# Patient Record
Sex: Male | Born: 1960
Health system: Southern US, Community
[De-identification: ages and names within clinical notes are randomized; demographics above are authoritative.]

## PROBLEM LIST (undated history)

## (undated) DIAGNOSIS — D649 Anemia, unspecified: Secondary | ICD-10-CM

## (undated) DIAGNOSIS — K219 Gastro-esophageal reflux disease without esophagitis: Secondary | ICD-10-CM

## (undated) DIAGNOSIS — I639 Cerebral infarction, unspecified: Secondary | ICD-10-CM

## (undated) DIAGNOSIS — I10 Essential (primary) hypertension: Secondary | ICD-10-CM

## (undated) DIAGNOSIS — I2510 Atherosclerotic heart disease of native coronary artery without angina pectoris: Secondary | ICD-10-CM

## (undated) DIAGNOSIS — R05 Cough: Secondary | ICD-10-CM

## (undated) DIAGNOSIS — T8859XA Other complications of anesthesia, initial encounter: Secondary | ICD-10-CM

## (undated) DIAGNOSIS — J309 Allergic rhinitis, unspecified: Secondary | ICD-10-CM

## (undated) DIAGNOSIS — I219 Acute myocardial infarction, unspecified: Secondary | ICD-10-CM

## (undated) DIAGNOSIS — J329 Chronic sinusitis, unspecified: Secondary | ICD-10-CM

## (undated) DIAGNOSIS — D735 Infarction of spleen: Secondary | ICD-10-CM

## (undated) DIAGNOSIS — E119 Type 2 diabetes mellitus without complications: Secondary | ICD-10-CM

## (undated) DIAGNOSIS — R011 Cardiac murmur, unspecified: Secondary | ICD-10-CM

## (undated) DIAGNOSIS — J189 Pneumonia, unspecified organism: Secondary | ICD-10-CM

## (undated) DIAGNOSIS — E7800 Pure hypercholesterolemia, unspecified: Secondary | ICD-10-CM

## (undated) DIAGNOSIS — M869 Osteomyelitis, unspecified: Secondary | ICD-10-CM

## (undated) DIAGNOSIS — R053 Chronic cough: Secondary | ICD-10-CM

## (undated) DIAGNOSIS — T4145XA Adverse effect of unspecified anesthetic, initial encounter: Secondary | ICD-10-CM

## (undated) HISTORY — DX: Infarction of spleen: D73.5

## (undated) HISTORY — PX: TONSILLECTOMY AND ADENOIDECTOMY: SUR1326

## (undated) HISTORY — PX: PENILE PROSTHESIS IMPLANT: SHX240

## (undated) HISTORY — PX: CARDIAC CATHETERIZATION: SHX172

---

## 2009-10-17 DIAGNOSIS — I639 Cerebral infarction, unspecified: Secondary | ICD-10-CM

## 2009-10-17 HISTORY — DX: Cerebral infarction, unspecified: I63.9

## 2011-03-11 ENCOUNTER — Other Ambulatory Visit: Payer: Self-pay | Admitting: Urology

## 2011-03-11 ENCOUNTER — Encounter (HOSPITAL_COMMUNITY): Payer: 59

## 2011-03-11 LAB — BASIC METABOLIC PANEL
Calcium: 10.3 mg/dL (ref 8.4–10.5)
GFR calc Af Amer: >60 mL/min (ref >60)
GFR calc non Af Amer: >60 mL/min (ref >60)
Glucose, Bld: 200 mg/dL — ABNORMAL HIGH (ref 70–99)
Potassium: 3.9 mEq/L (ref 3.5–5.1)
Sodium: 136 mEq/L (ref 135–145)

## 2011-03-11 LAB — CBC
HCT: 41.3 % (ref 39.0–52.0)
MCHC: 33.7 g/dL (ref 30.0–36.0)
Platelets: 257 10*3/uL (ref 150–400)
RDW: 12.8 % (ref 11.5–15.5)
WBC: 8.1 10*3/uL (ref 4.0–10.5)

## 2011-03-11 LAB — SURGICAL PCR SCREEN: Staphylococcus aureus: NEGATIVE

## 2011-03-17 ENCOUNTER — Inpatient Hospital Stay (HOSPITAL_COMMUNITY)
Admission: RE | Admit: 2011-03-17 | Discharge: 2011-03-18 | DRG: 710 | Disposition: A | Discharge status: Home Health | Payer: 59 | Source: Ambulatory Visit | Attending: Urology | Admitting: Urology

## 2011-03-17 DIAGNOSIS — N529 Male erectile dysfunction, unspecified: Principal | ICD-10-CM | POA: Diagnosis present

## 2011-03-17 DIAGNOSIS — Z8673 Personal history of transient ischemic attack (TIA), and cerebral infarction without residual deficits: Secondary | ICD-10-CM

## 2011-03-17 DIAGNOSIS — I10 Essential (primary) hypertension: Secondary | ICD-10-CM | POA: Diagnosis present

## 2011-03-17 DIAGNOSIS — E119 Type 2 diabetes mellitus without complications: Secondary | ICD-10-CM | POA: Diagnosis present

## 2011-03-17 LAB — GENTAMICIN LEVEL, RANDOM: Gentamicin Rm: 3.2 ug/mL

## 2011-03-17 LAB — GLUCOSE, CAPILLARY: Glucose-Capillary: 252 mg/dL — ABNORMAL HIGH (ref 70–99)

## 2011-03-17 NOTE — H&P (Signed)
  NAME:  Albert [name], Albert [name]               ACCOUNT NO.:  [id number]  MEDICAL RECORD NO.:  [id number]           PATIENT TYPE:  LOCATION:                                 FACILITY:  PHYSICIAN:  Ky Barban, M.D.DATE OF BIRTH:  17-May-1961  DATE OF ADMISSION: DATE OF DISCHARGE:  LH                             HISTORY & PHYSICAL   CHIEF COMPLAINT:  Erectile dysfunction.  A 50 year old gentleman I have seen in 2007 with erectile dysfunction. He was treated with medicine with some good results.  I have not seen him since.  He comes back with his wife complains that he has erectile dysfunction.  He cannot get erection in spite he has used all the medicine.  He does not want to use ErecAid.  He wants to know what other he has, so I told him about the penile implant, both inflatable ad rigid penile implants were discussed.  He is inclined, want me to implant inflatable type of implant.  I emphasized that he is diabetic.  The chances of infection in diabetics are higher, so if he develops infection in spite of the reason he will have broad-spectrum antibiotics, then I will have to go ahead and remove the implant.  He will then be at the same starting point with erectile dysfunction.  He understands, wants me to go ahead and do the surgery.  He came to see me on March 12 and I have told to come back after he has seen his family physician, medically cleared, then I will schedule him for implant surgery.  Other problem is that since I seen him last he has developed CVA.  He has weakness on the right side with left CVA, but he is walking around alert and having no residual effect.  His other medical problems include hypertension, type 2 diabetes.  MEDICATIONS:  He takes glyburide and Cialis.  SOCIAL HISTORY:  He does not smoke or drink or take any illicit drugs. He is married.  REVIEW OF SYSTEMS:  Denies any chest pain, orthopnea, PND, nausea, or vomiting.  PHYSICAL EXAMINATION:  GENERAL:   Moderately built, not in acute distress, fully conscious, alert and oriented. VITAL SIGNS:  Blood pressure 128/86, temperature is 98. CENTRAL NERVOUS SYSTEM:  No gross neurological deficit. HEAD, NECK, EYES, ENT:  Negative. CHEST:  Symmetrical. HEART:  Regular sinus rhythm.  No murmur. ABDOMEN:  Soft, flat.  Liver, spleen, and kidneys are not palpable.  No CVA tenderness. EXTERNAL GENITALIA:  Circumcised, meatus adequate.  Testicles are normal. RECTAL:  Normal sphincter tone.  No rectal mass.  Prostate 1.5+ smooth and firm.  IMPRESSION: 1. Erectile dysfunction. 2. Type 2 diabetes mellitus.  It should be noted that his family physician, Dr. Olena Leatherwood has evaluated him, medically cleared him for penile implant.  I have received written note from Dr. Olena Leatherwood from Feb 22, 2011.     Ky Barban, M.D.     MIJ/MEDQ  D:  03/16/2011  T:  03/17/2011  Job:  [id number]  Electronically Signed by Alleen Borne M.D. on 03/17/2011 03:10:01 PM

## 2011-03-18 ENCOUNTER — Observation Stay (HOSPITAL_COMMUNITY): Payer: 59

## 2011-03-18 LAB — BASIC METABOLIC PANEL
CO2: 26 mEq/L (ref 19–32)
Calcium: 9 mg/dL (ref 8.4–10.5)
Creatinine, Ser: 0.66 mg/dL (ref 0.4–1.5)
GFR calc non Af Amer: >60 mL/min (ref >60)
Glucose, Bld: 172 mg/dL — ABNORMAL HIGH (ref 70–99)
Sodium: 135 mEq/L (ref 135–145)

## 2011-03-18 LAB — GLUCOSE, CAPILLARY

## 2011-03-30 NOTE — Op Note (Signed)
NAME:  KENDREW, PACI               ACCOUNT NO.:  [id number]  MEDICAL RECORD NO.:  [id number]           PATIENT TYPE:  LOCATION:                                 FACILITY:  PHYSICIAN:  Ky Barban, M.D.DATE OF BIRTH:  1961/02/13  DATE OF PROCEDURE: DATE OF DISCHARGE:                              OPERATIVE REPORT   PREOPERATIVE DIAGNOSIS:  Erectile dysfunction.  POSTOPERATIVE DIAGNOSIS:  Erectile dysfunction.  PROCEDURE:  Insertion of inflatable penile implant size 15 cm plus 3 cm extender.  ANESTHESIA:  Spinal.  ESTIMATED BLOOD LOSS:  50 mL.  COMPLICATIONS:  None.  PROCEDURE:  The patient under spinal anesthesia in supine position after usual prep and drape, 1-1/2 inch long prepubic incision was made, carried down through the fatty tissue and the front of the pubic symphysis was exposed, at that point with blunt dissection on both sides of the suspensory ligament, the corpora cavernosa were exposed and using 2-0 Vicryl stitches in the corporeal body first on the right then on the left corporotomy was made between these two holding stitches and I made about 2 cm long corporotomy.  The corporeal body was entered on the right side and then I was able to introduce the tip of the sucker all the way proximally and distally once it was done, then I used Hegar dilators gradually dilated this corporeal body to 12 Hegar, #13 Hegar was tried.  It is slightly tight easily goes on the proximal end, distally it is difficult it goes maybe half the way, and I decided not to do any more dilation.  Also want to mention the patient has #18 Foley catheter inserted before the procedure was started.  A second corporotomy was made on the left side and in similar fashion corporeal bodies were dilated to 12 Hegar and 13 goes easily on the right side and the proximal end, it does not go all the way distally, and so I decided to go ahead and select the size and sizer was introduced on the  right side and the tip of the glans penis grabbed with the help of Allis clamp and with slight tension, I checked the size.  The size is 18 cm, so we selected 15 cm plus 3 cm extender.  In the meantime, when they were ready the cylinders, I went ahead and exposed the rectus sheath just above the pubic symphysis on the midline and 2 cm long incision and the rectus sheath was made starting from the pubic symphysis and in between the recti with blunt dissection I was able to enter into the prevesical space and with my finger dissection, I was able to dissect this area and then I used 2 inches Kling hold roll to fill up this area.  I left it in place.  This will be the site where the reservoir goes.  Once I had done that, I had already placed holding stitches in the margins of the rectus sheath using 2-0 Vicryl one on each side.  And I proceeded to insert the cylinders using Furlow inserter with special Mellody Dance needle.  The tip of the needle was introduced  into the inserter and the inserter  is inserted on the right side of the corporotomy and the needle was pushed through the mid glans penis and the thread was held with a hemostat. Needle was removed.  Then the cylinder right cylinder was guided into the corporotomy.  With blunt help with my finger, I introduced the proximal end into the corporotomy and then pushed it down into position with the help of peanut.  All the wrinkles visually were removed similarly, the left cylinder was positioned.  Once it was positioned, I activated properly this cylinders and they fit into the corporotomy nicely.  I do not see any aneurysm.  Penis was obtained.  The distal end of the cylinder appears to be under the glans penis.  The cylinders were deflated left about 30 mL in the cylinders.  Then the reservoir was placed into the retropubic space after removing the Kling from that area with blunt dissection, I pushed the reservoir in place, and then I used 75  mL of fluid total in the cylinders and the reservoir I had to use light 45 mL in the reservoir and there was 30 mL in the cylinders already.  Rubber shod clamp was applied to the tubing coming out of the cylinder.  Now with blunt dissection, I dissected the inside of the scrotum on the right side.  I was able to evert it and get all the tissue and very thin skin making sure without making any nick into the skin.  The pump was placed in the right hemiscrotum and held in place with a Babcock.  The pump appears to be in proper position.  Tubing already has a rubber shod clamp, so I was able to cut the extra tubing from the pump and the reservoir and then able to connect it properly with the special connectors.  There is no redundancy in the tubing, it was properly in the wound..  Once the connection was made then I was able to activate the pump and a nice erection is obtained. Periodically, I have irrigated the wound with antiseptic solution.  I proceeded to close the rectus sheath with interrupted sutures very carefully not to nick any tubing.  I used 0 Vicryl stitch.  Once that was closed, then I closed the right corporotomy under direct vision without making sure not to nick any tubing or the cylinders.  Both corporotomy were closed properly, I did not remove the covering of the tubing which goes to the cylinders.  Again the implant was activated, nice erection was obtained.  I left slightly inflated penile implant and then I proceeded to closed the incision using 3-0 Vicryl.  The fatty tissue was placed between the tubing to separate all of them carefully. Once the subcutaneous tissue was approximated and the skin was closed with subcuticular stitch.  Sterile gauze dressing applied.  At the end, I removed the stitches which were at the tip of the cylinders, and there was some bleeding going on from the glans penis with pressure I was able to stop it.  Sterile gauze dressing applied to the  prepubic area.  Foley catheter was left in place.  The patient left the operating room in satisfactory condition.     Ky Barban, M.D.     MIJ/MEDQ  D:  03/17/2011  T:  03/18/2011  Job:  [id number]  Electronically Signed by Alleen Borne M.D. on 03/30/2011 01:24:26 PM

## 2011-05-03 NOTE — Discharge Summary (Signed)
NAME:  Albert [name], Albert [name] NO.:  [id number]  MEDICAL RECORD NO.:  [id number]  LOCATION:  A324                          FACILITY:  [facility]  PHYSICIAN:  Ky Barban, M.D.DATE OF BIRTH:  Oct 11, 1961  DATE OF ADMISSION:  03/17/2011 DATE OF DISCHARGE:  06/01/2012LH                              DISCHARGE SUMMARY   A 50 years old gentleman who is known to have erectile dysfunction.  He wanted to have inflatable penile implant after going over the possible complications and limitation of the surgery.  He was brought as outpatient having routine admission workup, CBC, urinalysis, and SMA-7 is normal.  The patient is known diabetic and he is admitted in the hospital after having surgery.  AMS inflatable penile implant was inserted.  I used 15 cm plus 3 cm extender.  Postop course was benign. Next day, his wound was healing up nicely.  He was given prophylactic antibiotic of vancomycin and he needs to take it for another 7 days.  I have made arrangement for him to come as outpatient, have his vancomycin injection, and I will see him back in the office in 1 week.  FINAL DISCHARGE DIAGNOSIS:  Erectile dysfunction.  DISCHARGE CONDITION:  Improved.  DISCHARGE MEDICATIONS:  He is advised to continue his medicines for his diabetes and he will come to have his vancomycin as outpatient. I also gave him Percocet 30 tablets one q.6 hours p.r.n.     Ky Barban, M.D.     MIJ/MEDQ  D:  05/02/2011  T:  05/03/2011  Job:  [id number]

## 2012-10-17 DIAGNOSIS — I219 Acute myocardial infarction, unspecified: Secondary | ICD-10-CM

## 2012-10-17 HISTORY — PX: OTHER SURGICAL HISTORY: SHX169

## 2012-10-17 HISTORY — DX: Acute myocardial infarction, unspecified: I21.9

## 2012-12-07 ENCOUNTER — Inpatient Hospital Stay (HOSPITAL_COMMUNITY)
Admission: EM | Admit: 2012-12-07 | Discharge: 2012-12-18 | DRG: 617 | Disposition: A | Discharge status: Home / Self Care | Payer: 59 | Attending: Family Medicine | Admitting: Family Medicine

## 2012-12-07 ENCOUNTER — Emergency Department (HOSPITAL_COMMUNITY): Payer: 59

## 2012-12-07 ENCOUNTER — Encounter (HOSPITAL_COMMUNITY): Payer: Self-pay | Admitting: Emergency Medicine

## 2012-12-07 DIAGNOSIS — I69998 Other sequelae following unspecified cerebrovascular disease: Secondary | ICD-10-CM

## 2012-12-07 DIAGNOSIS — I96 Gangrene, not elsewhere classified: Secondary | ICD-10-CM | POA: Diagnosis present

## 2012-12-07 DIAGNOSIS — R29898 Other symptoms and signs involving the musculoskeletal system: Secondary | ICD-10-CM | POA: Diagnosis present

## 2012-12-07 DIAGNOSIS — E1169 Type 2 diabetes mellitus with other specified complication: Principal | ICD-10-CM | POA: Diagnosis present

## 2012-12-07 DIAGNOSIS — N179 Acute kidney failure, unspecified: Secondary | ICD-10-CM | POA: Diagnosis present

## 2012-12-07 DIAGNOSIS — R110 Nausea: Secondary | ICD-10-CM | POA: Diagnosis present

## 2012-12-07 DIAGNOSIS — Z79899 Other long term (current) drug therapy: Secondary | ICD-10-CM

## 2012-12-07 DIAGNOSIS — E1369 Other specified diabetes mellitus with other specified complication: Secondary | ICD-10-CM | POA: Diagnosis present

## 2012-12-07 DIAGNOSIS — Z794 Long term (current) use of insulin: Secondary | ICD-10-CM

## 2012-12-07 DIAGNOSIS — E1142 Type 2 diabetes mellitus with diabetic polyneuropathy: Secondary | ICD-10-CM | POA: Diagnosis present

## 2012-12-07 DIAGNOSIS — Z7982 Long term (current) use of aspirin: Secondary | ICD-10-CM

## 2012-12-07 DIAGNOSIS — I70269 Atherosclerosis of native arteries of extremities with gangrene, unspecified extremity: Secondary | ICD-10-CM | POA: Diagnosis present

## 2012-12-07 DIAGNOSIS — E1149 Type 2 diabetes mellitus with other diabetic neurological complication: Secondary | ICD-10-CM | POA: Diagnosis present

## 2012-12-07 DIAGNOSIS — I10 Essential (primary) hypertension: Secondary | ICD-10-CM | POA: Diagnosis present

## 2012-12-07 DIAGNOSIS — IMO0002 Reserved for concepts with insufficient information to code with codable children: Secondary | ICD-10-CM | POA: Diagnosis present

## 2012-12-07 DIAGNOSIS — E7800 Pure hypercholesterolemia, unspecified: Secondary | ICD-10-CM | POA: Diagnosis present

## 2012-12-07 DIAGNOSIS — Z01818 Encounter for other preprocedural examination: Secondary | ICD-10-CM

## 2012-12-07 DIAGNOSIS — J329 Chronic sinusitis, unspecified: Secondary | ICD-10-CM | POA: Diagnosis present

## 2012-12-07 DIAGNOSIS — I70209 Unspecified atherosclerosis of native arteries of extremities, unspecified extremity: Secondary | ICD-10-CM | POA: Diagnosis present

## 2012-12-07 HISTORY — DX: Cough: R05

## 2012-12-07 HISTORY — DX: Cerebral infarction, unspecified: I63.9

## 2012-12-07 HISTORY — DX: Chronic sinusitis, unspecified: J32.9

## 2012-12-07 HISTORY — DX: Allergic rhinitis, unspecified: J30.9

## 2012-12-07 HISTORY — DX: Type 2 diabetes mellitus without complications: E11.9

## 2012-12-07 HISTORY — DX: Essential (primary) hypertension: I10

## 2012-12-07 HISTORY — DX: Chronic cough: R05.3

## 2012-12-07 HISTORY — DX: Pure hypercholesterolemia, unspecified: E78.00

## 2012-12-07 LAB — BASIC METABOLIC PANEL
BUN: 24 mg/dL — ABNORMAL HIGH (ref 6–23)
CO2: 21 mEq/L (ref 19–32)
Chloride: 94 mEq/L — ABNORMAL LOW (ref 96–112)
Creatinine, Ser: 1.67 mg/dL — ABNORMAL HIGH (ref 0.50–1.35)
GFR calc Af Amer: 54 mL/min — ABNORMAL LOW (ref >90)
Glucose, Bld: 229 mg/dL — ABNORMAL HIGH (ref 70–99)
Potassium: 4.1 mEq/L (ref 3.5–5.1)

## 2012-12-07 LAB — CBC
HCT: 36.4 % — ABNORMAL LOW (ref 39.0–52.0)
Hemoglobin: 12.8 g/dL — ABNORMAL LOW (ref 13.0–17.0)
MCV: 78.6 fL (ref 78.0–100.0)
RBC: 4.63 MIL/uL (ref 4.22–5.81)
RDW: 13.1 % (ref 11.5–15.5)
WBC: 14.3 10*3/uL — ABNORMAL HIGH (ref 4.0–10.5)

## 2012-12-07 MED ORDER — ASPIRIN EC 81 MG PO TBEC
81.0000 mg | DELAYED_RELEASE_TABLET | Freq: Every day | ORAL | Status: DC
  Administered 2012-12-08 – 2012-12-18 (×10): 81 mg via ORAL
  Filled 2012-12-07 (×11): qty 1

## 2012-12-07 MED ORDER — SODIUM CHLORIDE 0.9 % IV SOLN: INTRAVENOUS | Status: DC

## 2012-12-07 MED ORDER — INSULIN GLARGINE 100 UNIT/ML ~~LOC~~ SOLN
10.0000 [IU] | Freq: Every day | SUBCUTANEOUS | Status: DC
  Administered 2012-12-08 (×2): 10 [IU] via SUBCUTANEOUS

## 2012-12-07 MED ORDER — INSULIN ASPART 100 UNIT/ML ~~LOC~~ SOLN
0.0000 [IU] | SUBCUTANEOUS | Status: DC
  Administered 2012-12-08: 15 [IU] via SUBCUTANEOUS
  Administered 2012-12-08: 11 [IU] via SUBCUTANEOUS
  Administered 2012-12-08 (×2): 7 [IU] via SUBCUTANEOUS
  Administered 2012-12-08: 3 [IU] via SUBCUTANEOUS
  Administered 2012-12-09: 12 [IU] via SUBCUTANEOUS
  Administered 2012-12-09: 15 [IU] via SUBCUTANEOUS
  Administered 2012-12-09: 11 [IU] via SUBCUTANEOUS
  Administered 2012-12-09 – 2012-12-10 (×2): 7 [IU] via SUBCUTANEOUS
  Administered 2012-12-10: 11 [IU] via SUBCUTANEOUS
  Administered 2012-12-10 (×2): 7 [IU] via SUBCUTANEOUS
  Administered 2012-12-10 – 2012-12-11 (×3): 4 [IU] via SUBCUTANEOUS
  Administered 2012-12-11: 7 [IU] via SUBCUTANEOUS

## 2012-12-07 MED ORDER — PIPERACILLIN-TAZOBACTAM 3.375 G IVPB 30 MIN
3.3750 g | Freq: Once | INTRAVENOUS | Status: AC
  Administered 2012-12-07: 3.375 g via INTRAVENOUS
  Filled 2012-12-07: qty 50

## 2012-12-07 MED ORDER — VANCOMYCIN HCL IN DEXTROSE 1-5 GM/200ML-% IV SOLN: 1000.0000 mg | Freq: Once | INTRAVENOUS | Status: DC

## 2012-12-07 MED ORDER — SIMVASTATIN 40 MG PO TABS
40.0000 mg | ORAL_TABLET | Freq: Every evening | ORAL | Status: DC
  Administered 2012-12-08 – 2012-12-17 (×10): 40 mg via ORAL
  Filled 2012-12-07 (×11): qty 1

## 2012-12-07 MED ORDER — ONDANSETRON HCL 4 MG/2ML IJ SOLN
4.0000 mg | Freq: Four times a day (QID) | INTRAMUSCULAR | Status: DC | PRN
  Administered 2012-12-08 (×2): 4 mg via INTRAVENOUS
  Filled 2012-12-07 (×2): qty 2

## 2012-12-07 MED ORDER — ONDANSETRON HCL 4 MG/2ML IJ SOLN
4.0000 mg | Freq: Once | INTRAMUSCULAR | Status: AC
  Administered 2012-12-07: 4 mg via INTRAVENOUS
  Filled 2012-12-07: qty 2

## 2012-12-07 MED ORDER — METOPROLOL TARTRATE 25 MG PO TABS
25.0000 mg | ORAL_TABLET | Freq: Two times a day (BID) | ORAL | Status: DC
  Administered 2012-12-08: 25 mg via ORAL
  Filled 2012-12-07 (×3): qty 1

## 2012-12-07 MED ORDER — IRBESARTAN 300 MG PO TABS
300.0000 mg | ORAL_TABLET | Freq: Every day | ORAL | Status: DC
  Filled 2012-12-07: qty 1

## 2012-12-07 MED ORDER — HEPARIN SODIUM (PORCINE) 5000 UNIT/ML IJ SOLN
5000.0000 [IU] | Freq: Three times a day (TID) | INTRAMUSCULAR | Status: DC
  Administered 2012-12-08 – 2012-12-10 (×6): 5000 [IU] via SUBCUTANEOUS
  Filled 2012-12-07 (×9): qty 1

## 2012-12-07 MED ORDER — HYDROMORPHONE HCL PF 1 MG/ML IJ SOLN
0.5000 mg | Freq: Once | INTRAMUSCULAR | Status: AC
  Administered 2012-12-07: 0.5 mg via INTRAVENOUS
  Filled 2012-12-07: qty 1

## 2012-12-07 MED ORDER — HYDROMORPHONE HCL PF 1 MG/ML IJ SOLN
0.5000 mg | INTRAMUSCULAR | Status: DC | PRN
  Administered 2012-12-09 – 2012-12-14 (×11): 0.5 mg via INTRAVENOUS
  Filled 2012-12-07 (×9): qty 1

## 2012-12-07 MED ORDER — IRBESARTAN-HYDROCHLOROTHIAZIDE 300-12.5 MG PO TABS: 1.0000 | ORAL_TABLET | Freq: Every day | ORAL | Status: DC

## 2012-12-07 MED ORDER — HYDROCHLOROTHIAZIDE 12.5 MG PO CAPS
12.5000 mg | ORAL_CAPSULE | Freq: Every day | ORAL | Status: DC
  Filled 2012-12-07: qty 1

## 2012-12-07 MED ORDER — SODIUM CHLORIDE 0.9 % IV SOLN
INTRAVENOUS | Status: DC
  Administered 2012-12-08 – 2012-12-11 (×4): via INTRAVENOUS
  Administered 2012-12-15: 16 mL/h via INTRAVENOUS
  Administered 2012-12-16: 03:00:00 via INTRAVENOUS

## 2012-12-07 NOTE — H&P (Signed)
Family Medicine Teaching Service Admission H&P Service Pager: [phone]  Patient name: Albert [name] Medical record number: [id number] Date of birth: Aug 10, 1961 Age: 52 y.o. Gender: male  Primary Care Provider: [name], MD Attending Physician: Tobey Grim, MD Consultants:   Orthopedics: Lysle Rubens Magnus Ivan & Lajoyce Corners)  Vascular Surgery: Consulted directly by Dr. Magnus Ivan (ortho)  CODE STATUS: Full Code  CC  Gangrene  HPI  Albert [name] is a 52 y.o. year old male presenting with acute change in his foot following a left 4th toe amputation ~1 month ago by his surgeon in [place] (Dr. Gabriel Cirri).  He states he followed up 2 days ago and had the wound debrided due to poor healing and was told to perform qod dressing changes.  He reports when removing the dressing today his Left 3rd toe was noted to be black, there was increased drainage from the wound, and he had rubor of the dorsum of his foot.  He is followed in [name] by his primary care doctor for poorly controlled DM2 (last A1c ~9), HTN, HLD.  He reports he has been working very hard recently on controlling his chronic conditions however had been previously very poorly controlled.  He had been referred to vascular surgery and was due to meet with Dr. Imogene Burn within the next 2 weeks.  ROS   Constitutional No fatigue, no malaise, nasal congestion  Infectious subjective fevers and chills, no measurable fevers  Resp Cough associated with nasal congestion >1 month - chronic, non-productive, no dyspnea,    CV No orthopnea, no DOE, no claudication, no palpitations  GI + Nausea but no vomiting, reports "diabetes has given him stomach problems" but unaware of "gastroparesis"  GU No dysuria, no frequency  Skin  rubor on LE,   Neuro Hx of CVA with R UE residual deficit    HISTORY  PMHx:  Past Medical History  Diagnosis Date  . Hypertension   . Diabetes mellitus without complication ~2000    last HbA1c ~9  . Hypercholesteremia   .  CVA (cerebral infarction) 2011    R hand deficit  . Chronic sinusitis   . Allergic rhinitis   . Chronic cough     PSHx: Past Surgical History  Procedure Laterality Date  . 4th toe amputation    . Tonsillectomy and adenoidectomy    . Penile prosthesis implant      Social Hx: History   Social History  . Marital Status: Married    Spouse Name: [name]    Number of Children: [name]  . Years of Education: [name]   Social History Main Topics  . Smoking status: Never Smoker   . Smokeless tobacco: None  . Alcohol Use: No  . Drug Use: No  . Sexually Active: None   Other Topics Concern  . None   Social History Narrative   Lives in [place]   Wife: [name]    Family Hx: Family History  Problem Relation Age of Onset  . Diabetes    . Pancreatic cancer Mother   . Breast cancer Sister     Allergies: Allergies  Allergen Reactions  . Amoxicillin     vomiting    Home Medications: Prescriptions prior to admission  Medication Sig Dispense Refill  . aspirin 81 MG tablet Take 81 mg by mouth daily.      Marland Kitchen glyBURIDE-metformin (GLUCOVANCE) 2.5-500 MG per tablet Take 1 tablet by mouth daily with breakfast.      . HYDROcodone-acetaminophen (NORCO/VICODIN) 5-325 MG per  tablet Take 1 tablet by mouth every 6 (six) hours as needed for pain.      Marland Kitchen insulin aspart (NOVOLOG FLEXPEN) 100 UNIT/ML injection Inject 12 Units into the skin 3 (three) times daily. Take every time he eats per patient      . insulin detemir (LEVEMIR) 100 UNIT/ML injection Inject 20 Units into the skin every evening.      . irbesartan-hydrochlorothiazide (AVALIDE) 300-12.5 MG per tablet Take 1 tablet by mouth daily.      . metoprolol tartrate (LOPRESSOR) 25 MG tablet Take 25 mg by mouth 2 (two) times daily.      . Nutritional Supplements (EQUATE PO) Take 1 tablet by mouth daily.      . ondansetron (ZOFRAN) 8 MG tablet Take by mouth every 8 (eight) hours as needed for nausea. For upset stomach      . simvastatin (ZOCOR) 40 MG  tablet Take 40 mg by mouth every evening.      . traMADol (ULTRAM) 50 MG tablet Take 50 mg by mouth every 6 (six) hours as needed for pain. For pain        OBJECTIVE  Vitals: Temp:  [97.6 F (36.4 C)-98.3 F (36.8 C)] 98.3 F (36.8 C) (02/21 2315) Pulse Rate:  [97-106] 106 (02/21 2315) Resp:  [18-20] 20 (02/21 2315) BP: (100-154)/(64-95) 154/95 mmHg (02/21 2315) SpO2:  [98 %-100 %] 100 % (02/21 2315) Weight:  [245 lb 11.2 oz (111.449 kg)] 245 lb 11.2 oz (111.449 kg) (02/21 2315)  Weight: Wt Readings from Last 3 Encounters:  12/07/12 245 lb 11.2 oz (111.449 kg)  12/07/12 245 lb 11.2 oz (111.449 kg)    I&Os: Yesterday:   This shift:     PE: GENERAL:  Adult obese caucasian  male. In no discomfort; no respiratory distress. PSYCH: Alert and appropriately interactive; Insight:Good   H&N: AT/[place], trachea midline; TM clear, no fluid; high arched palate, some nasal congestion EENT:  MMM, no scleral icterus, EOMi HEART: RRR, S1/S2 heard, no murmur LUNGS: CTA B, no wheezes, no crackles EXTREMITIES: Moves all 4 extremities spontaneously, R hand with claw deformation and associated hemiparesis, warm well perfused, no edema, non-palpable DP & TP on Left, trace DP on R.  EDP reports dopplerable pulses in BLE    LABS: CBC BMET   Recent Labs Lab 12/07/12 2041  WBC 14.3*  HGB 12.8*  HCT 36.4*  PLT 265    Recent Labs Lab 12/07/12 2041  NA 131*  K 4.1  CL 94*  CO2 21  BUN 24*  CREATININE 1.67*  GLUCOSE 229*  CALCIUM 8.9     URINE STUDIES: None  MICRO: None - Emperic ABX started >4 hour prior to eval  IMAGING: Dg Foot Complete Left  12/07/2012  *RADIOLOGY REPORT*  Clinical Data: Toe injury  LEFT FOOT - COMPLETE 3+ VIEW  Comparison: 11/08/2012  Findings: Three views of the left foot submitted.  Stable chronic deformity of the proximal phalanx fifth toe. There is soft tissue swelling fifth toe.  The patient is status post amputation of the fourth toe.  There is  probable prior partial resection of distal aspect of the fourth metatarsal.  There is cortical irregularity distal aspect of the fourth metatarsal.  Osteomyelitis cannot be excluded. Clinical correlation is necessary.  No acute fracture or subluxation.  IMPRESSION: Stable chronic deformity of the proximal phalanx fifth toe. There is soft tissue swelling fifth toe.  The patient is status post amputation of the fourth toe.  There is probable  prior partial resection of distal aspect of the fourth metatarsal.  There is cortical irregularity distal aspect of the fourth metatarsal. Osteomyelitis cannot be excluded. Clinical correlation is necessary.  No acute fracture or subluxation.   Original Report Authenticated By: Natasha Mead, M.D.    EKG: ORDERED >>>  Medications:   . sodium chloride     . aspirin EC  81 mg Oral Daily  . heparin  5,000 Units Subcutaneous Q8H  . irbesartan  300 mg Oral Daily   And  . hydrochlorothiazide  12.5 mg Oral Daily  . insulin aspart  0-20 Units Subcutaneous Q4H  . insulin glargine  10 Units Subcutaneous QHS  . metoprolol tartrate  25 mg Oral BID  . simvastatin  40 mg Oral QPM  . vancomycin  1,000 mg Intravenous Once   HYDROmorphone (DILAUDID) injection, ondansetron (ZOFRAN) IV, sodium chloride  Assessment & Plan  LOS: [age] y.o. year old male with gangrene of L 3rd toe s/p prior 4th toe excision with poor healing and local tissue necrosis & rubor.  Subjective fevers.  Poor blood flow likely due to combination of microvascular and large vessel disease disease 2o/2 poorly controlled DM and likely atherosclerosis given hx of prior CVA.  # Gangrene & Leukocytosis & Diabetic Peripheral Neuropathy: Dr. Magnus Ivan (ortho) has evaluated and will plan for surgery on 2/22.  He will discuss with Vascular Surgery.  No blood cultures obtained, has been >4 hours after initiation of ABX  Vanco & Zosyn per pharmacy  Dilaudid for pain  Consider Neurontin if pain worsens  # Cardiac &  HTN: Will continue home meds.  EKG for pre-operative evaluation. On BB at home continue for operative risk reduction.  Monitor Renal function given ARB. High functional status, no resp/cardiac limitations activity level able to walk ~34mile without difficulty prior to foot   Irbesartan, HCTZ, Lopressor  # Peripheral Vascular Disease: likely combination of microvascular and large vessel disease.  Vascular to evaluate.  Consider intervention  ABI pre-op although will likely be inaccurate given diabetic  # Nasal Congestion: pt with high arched palate, chronic sinus disease.  Would likely benefit from trial of Nasal Corticosteroids as OP.  Will provide Ocean Nasal Spray.  Given duration of sx could consider infection but Vanco/Zosyn will adequately cover any potential infection  Ocean Nasal Spray  # Kidney Disease (likely chronic): no baseline level.  Will continue to monitor if worsening will stop ARB and other nephrotoxic agents.      # DM & HLD: Will obtain risk stratification labs.  (Lipid, A1c)  Statin,   Lantus 10 + SSI Resistant q4o (NPO for surgery)  # Nausea: Chronic as Zofran on home med list.  Consider diabetic gastroparesis. Will trial PPI  Zofran  Protonix  --- FEN  *KVO -NPO after midnight except meds --- PPx: Heparin     Disposition  To Med/Surg bed.  IV ABX.  To OR in AM with Ortho +/- Vascular for amputation with potential vascular intervention.  Consider continuing ABX (okay to transition to po) following surgery for 2-3 days given no blood cultures obtained.  Will need to monitor fever curve and observe leukocytosis.   Andrena Mews, DO [facility] Resident - PGY-2  12/08/2012 12:27 AM

## 2012-12-07 NOTE — ED Notes (Signed)
IV team paged for pt venous access

## 2012-12-07 NOTE — ED Provider Notes (Signed)
Patient  presents with discoloration of left distal foot. On exam the floor to middle toe is gangrenous and black web space between third and fifth toe is also gangrenous and black fourth toe has been previously amputated  Doug Sou, MD 12/07/12 2247

## 2012-12-07 NOTE — ED Provider Notes (Signed)
History     CSN: [id number]  Arrival date & time 12/07/12  1811   First MD Initiated Contact with Patient 12/07/12 2031      Chief Complaint  Patient presents with  . Toe Injury    (Consider location/radiation/quality/duration/timing/severity/associated sxs/prior treatment) HPI Comments: Albert [name] is a 52 y.o. male with a history of hypertension, diabetes and hyperlipidemia presents emergency department with chief complaint of necrotic left third toe. Onset of symptoms began yesterday and are associated with tactile fever, chills, and pain. 1 month ago pt reports similar presentation of his 4th toe which was diagnosed w gangrene & surgically removed in [facility] by Dr.DeMason.  Patient has been following up with his surgeon over the last month and was evaluated by him on Wednesday at which time he was recommended to followup with vascular surgery and for decreased peripheral flow.  Patient states he's got an appointment with Dr. Claudie Fisherman scheduled on March 5, however his toe became more painful and black yesterday causing him to come to the emergency department for further evaluation.  The history is provided by the patient and medical records.    Past Medical History  Diagnosis Date  . Hypertension   . Diabetes mellitus without complication   . Hypercholesteremia     History reviewed. No pertinent past surgical history.  History reviewed. No pertinent family history.  History  Substance Use Topics  . Smoking status: Never Smoker   . Smokeless tobacco: Not on file  . Alcohol Use: No      Review of Systems  Constitutional: Positive for fever and chills. Negative for appetite change.  HENT: Negative for congestion.   Eyes: Negative for visual disturbance.  Respiratory: Negative for shortness of breath.   Cardiovascular: Negative for chest pain and leg swelling.  Gastrointestinal: Negative for abdominal pain.  Genitourinary: Negative for dysuria, urgency and frequency.   Skin: Positive for color change, rash and wound.  Neurological: Negative for dizziness, syncope, weakness, light-headedness, numbness and headaches.  Psychiatric/Behavioral: Negative for confusion.  All other systems reviewed and are negative.    Allergies  Amoxicillin  Home Medications   Current Outpatient Rx  Name  Route  Sig  Dispense  Refill  . aspirin 81 MG tablet   Oral   Take 81 mg by mouth daily.         Marland Kitchen glyBURIDE-metformin (GLUCOVANCE) 2.5-500 MG per tablet   Oral   Take 1 tablet by mouth daily with breakfast.         . HYDROcodone-acetaminophen (NORCO/VICODIN) 5-325 MG per tablet   Oral   Take 1 tablet by mouth every 6 (six) hours as needed for pain.         Marland Kitchen insulin aspart (NOVOLOG FLEXPEN) 100 UNIT/ML injection   Subcutaneous   Inject 12 Units into the skin 3 (three) times daily. Take every time he eats per patient         . insulin detemir (LEVEMIR) 100 UNIT/ML injection   Subcutaneous   Inject 20 Units into the skin every evening.         . irbesartan-hydrochlorothiazide (AVALIDE) 300-12.5 MG per tablet   Oral   Take 1 tablet by mouth daily.         . metoprolol tartrate (LOPRESSOR) 25 MG tablet   Oral   Take 25 mg by mouth 2 (two) times daily.         . Nutritional Supplements (EQUATE PO)   Oral   Take 1 tablet  by mouth daily.         . ondansetron (ZOFRAN) 8 MG tablet   Oral   Take by mouth every 8 (eight) hours as needed for nausea. For upset stomach         . simvastatin (ZOCOR) 40 MG tablet   Oral   Take 40 mg by mouth every evening.         . traMADol (ULTRAM) 50 MG tablet   Oral   Take 50 mg by mouth every 6 (six) hours as needed for pain. For pain           BP 100/64  Pulse 99  Temp(Src) 97.6 F (36.4 C) (Oral)  Resp 18  SpO2 100%  Physical Exam  Nursing note and vitals reviewed. Constitutional: He is oriented to person, place, and time. He appears well-developed and well-nourished. No distress.   HENT:  Head: Normocephalic and atraumatic.  Eyes: Conjunctivae and EOM are normal.  Neck: Normal range of motion.  Cardiovascular:  Regular rate rhythm.  Decreased pedal pulses bilaterally.  2+ pitting edema of left lower extremity  Pulmonary/Chest: Effort normal.  Lungs clear to auscultation bilaterally  Musculoskeletal: Normal range of motion.  Neurological: He is alert and oriented to person, place, and time.  Skin: Skin is warm and dry. No rash noted. He is not diaphoretic.  Erythema of left lower extremity extending from the digits to ankle anteriorly.  Third toe necrotic.  Open wound at amputation site of fourth metacarpal with purulent drainage.  Tenderness to palpation throughout  Psychiatric: He has a normal mood and affect. His behavior is normal.    ED Course  Procedures (including critical care time)  Labs Reviewed  CBC - Abnormal; Notable for the following:    WBC 14.3 (*)    Hemoglobin 12.8 (*)    HCT 36.4 (*)    All other components within normal limits  BASIC METABOLIC PANEL - Abnormal; Notable for the following:    Sodium 131 (*)    Chloride 94 (*)    Glucose, Bld 229 (*)    BUN 24 (*)    Creatinine, Ser 1.67 (*)    GFR calc non Af Amer 46 (*)    GFR calc Af Amer 54 (*)    All other components within normal limits   Dg Foot Complete Left  12/07/2012  *RADIOLOGY REPORT*  Clinical Data: Toe injury  LEFT FOOT - COMPLETE 3+ VIEW  Comparison: 11/08/2012  Findings: Three views of the left foot submitted.  Stable chronic deformity of the proximal phalanx fifth toe. There is soft tissue swelling fifth toe.  The patient is status post amputation of the fourth toe.  There is probable prior partial resection of distal aspect of the fourth metatarsal.  There is cortical irregularity distal aspect of the fourth metatarsal.  Osteomyelitis cannot be excluded. Clinical correlation is necessary.  No acute fracture or subluxation.  IMPRESSION: Stable chronic deformity of the  proximal phalanx fifth toe. There is soft tissue swelling fifth toe.  The patient is status post amputation of the fourth toe.  There is probable prior partial resection of distal aspect of the fourth metatarsal.  There is cortical irregularity distal aspect of the fourth metatarsal. Osteomyelitis cannot be excluded. Clinical correlation is necessary.  No acute fracture or subluxation.   Original Report Authenticated By: Natasha Mead, M.D.    Consult Ortho Surgery, Dr. Magnus Ivan: will see pt as consult after admission.   No diagnosis found.  MDM  Gangrene  52 year old diabetic presents emergency department for left third necrotic toe, onset yesterday and wound check of left fourth toe amputation site from previous diagnosis of gangrene.  Labs and imaging reviewed indicating questionable osteomyelitis, leukocytosis, hyperglycemia and acute renal failure.  IV fluids, vancomycin and Zosyn, and pain medications given one emergency department.  Patient is with normal vital signs appears hemodynamically stable in no acute distress.  Orthopedics surgery has been consulted as above.  Patient to be admitted to family service as his primary care provider is located out of town in [place], [place] internal medicine        Jaci Carrel, [place] 12/10/12 2147

## 2012-12-07 NOTE — ED Notes (Signed)
Pt c/o discoloration and possible infection; pt sts hx of similar with amputations

## 2012-12-07 NOTE — Consult Note (Signed)
Reason for Consult:  Left foot necrotic 3rd toe and open wound with poor healing Referring Physician:   ER EM  Albert [name] is an 52 y.o. male.  HPI:   52 yo male with poorly controlled diabetes and a recent 4th toe amputation about one month ago in [place].  He was being referred to vascular surgery as well as the wound center, but came to the ER tonight with a necrotic 3rd toe and increased drainage form the open wound on his foot.  Past Medical History  Diagnosis Date  . Hypertension   . Diabetes mellitus without complication ~2000    last HbA1c ~9  . Hypercholesteremia   . CVA (cerebral infarction) 2011    R hand deficit  . Chronic sinusitis   . Allergic rhinitis   . Chronic cough     Past Surgical History  Procedure Laterality Date  . 4th toe amputation    . Tonsillectomy and adenoidectomy    . Penile prosthesis implant      Family History  Problem Relation Age of Onset  . Diabetes    . Pancreatic cancer Mother   . Breast cancer Sister     Social History:  reports that he has never smoked. He does not have any smokeless tobacco history on file. He reports that he does not drink alcohol or use illicit drugs.  Allergies:  Allergies  Allergen Reactions  . Amoxicillin     vomiting    Medications: I have reviewed the patient's current medications.  Results for orders placed during the hospital encounter of 12/07/12 (from the past 48 hour(s))  CBC     Status: Abnormal   Collection Time    12/07/12  8:41 PM      Result Value Range   WBC 14.3 (*) 4.0 - 10.5 K/uL   RBC 4.63  4.22 - 5.81 MIL/uL   Hemoglobin 12.8 (*) 13.0 - 17.0 g/dL   HCT 62.1 (*) 30.8 - 65.7 %   MCV 78.6  78.0 - 100.0 fL   MCH 27.6  26.0 - 34.0 pg   MCHC 35.2  30.0 - 36.0 g/dL   RDW 84.6  96.2 - 95.2 %   Platelets 265  150 - 400 K/uL  BASIC METABOLIC PANEL     Status: Abnormal   Collection Time    12/07/12  8:41 PM      Result Value Range   Sodium 131 (*) 135 - 145 mEq/L   Potassium 4.1  3.5  - 5.1 mEq/L   Chloride 94 (*) 96 - 112 mEq/L   CO2 21  19 - 32 mEq/L   Glucose, Bld 229 (*) 70 - 99 mg/dL   BUN 24 (*) 6 - 23 mg/dL   Creatinine, Ser 8.41 (*) 0.50 - 1.35 mg/dL   Calcium 8.9  8.4 - 32.4 mg/dL   GFR calc non Af Amer 46 (*) >90 mL/min   GFR calc Af Amer 54 (*) >90 mL/min   Comment:            The eGFR has been calculated     using the CKD EPI equation.     This calculation has not been     validated in all clinical     situations.     eGFR's persistently     <90 mL/min signify     possible Chronic Kidney Disease.    Dg Foot Complete Left  12/07/2012  *RADIOLOGY REPORT*  Clinical Data: Toe injury  LEFT FOOT - COMPLETE 3+ VIEW  Comparison: 11/08/2012  Findings: Three views of the left foot submitted.  Stable chronic deformity of the proximal phalanx fifth toe. There is soft tissue swelling fifth toe.  The patient is status post amputation of the fourth toe.  There is probable prior partial resection of distal aspect of the fourth metatarsal.  There is cortical irregularity distal aspect of the fourth metatarsal.  Osteomyelitis cannot be excluded. Clinical correlation is necessary.  No acute fracture or subluxation.  IMPRESSION: Stable chronic deformity of the proximal phalanx fifth toe. There is soft tissue swelling fifth toe.  The patient is status post amputation of the fourth toe.  There is probable prior partial resection of distal aspect of the fourth metatarsal.  There is cortical irregularity distal aspect of the fourth metatarsal. Osteomyelitis cannot be excluded. Clinical correlation is necessary.  No acute fracture or subluxation.   Original Report Authenticated By: Natasha Mead, M.D.     ROS Blood pressure 154/95, pulse 106, temperature 98.3 F (36.8 C), temperature source Oral, resp. rate 20, height 5\' 11"  (1.803 m), weight 111.449 kg (245 lb 11.2 oz), SpO2 100.00%. Physical Exam  Musculoskeletal:       Left ankle: He exhibits swelling and abnormal pulse.        Feet:    Assessment/Plan: Left foot with open wound and necrotic 3rd toe 1) to the OR tomorrow 2/22 for irrigation and debridement of wounds on his left foot as well as a 3rd toe amputation.  He may loose his 5th toe as well.  He understands fully the need for surgery and wishes to proceed.  Vascular surgery will likely be consulted during this admission as well. Family Medicine is graciously admitting this patient.  Kathryne Hitch 12/07/2012, 11:19 PM

## 2012-12-07 NOTE — ED Notes (Signed)
Admitting provider in room with pt

## 2012-12-07 NOTE — ED Notes (Signed)
Was transported to [facility] by [facility]

## 2012-12-08 ENCOUNTER — Encounter (HOSPITAL_COMMUNITY): Payer: Self-pay | Admitting: Anesthesiology

## 2012-12-08 ENCOUNTER — Encounter (HOSPITAL_COMMUNITY)
Admission: EM | Disposition: A | Discharge status: Home / Self Care | Payer: Self-pay | Source: Home / Self Care | Attending: Family Medicine

## 2012-12-08 ENCOUNTER — Inpatient Hospital Stay (HOSPITAL_COMMUNITY): Payer: 59 | Admitting: Anesthesiology

## 2012-12-08 HISTORY — PX: I & D EXTREMITY: SHX5045

## 2012-12-08 HISTORY — PX: AMPUTATION: SHX166

## 2012-12-08 LAB — GLUCOSE, CAPILLARY
Glucose-Capillary: 140 mg/dL — ABNORMAL HIGH (ref 70–99)
Glucose-Capillary: 150 mg/dL — ABNORMAL HIGH (ref 70–99)
Glucose-Capillary: 201 mg/dL — ABNORMAL HIGH (ref 70–99)
Glucose-Capillary: 213 mg/dL — ABNORMAL HIGH (ref 70–99)
Glucose-Capillary: 305 mg/dL — ABNORMAL HIGH (ref 70–99)

## 2012-12-08 LAB — CBC
HCT: 36.5 % — ABNORMAL LOW (ref 39.0–52.0)
Platelets: 265 10*3/uL (ref 150–400)
Platelets: 281 10*3/uL (ref 150–400)
RBC: 4.62 MIL/uL (ref 4.22–5.81)
RBC: 4.63 MIL/uL (ref 4.22–5.81)
RDW: 13.1 % (ref 11.5–15.5)
RDW: 13.2 % (ref 11.5–15.5)
WBC: 12.9 10*3/uL — ABNORMAL HIGH (ref 4.0–10.5)
WBC: 13.8 10*3/uL — ABNORMAL HIGH (ref 4.0–10.5)

## 2012-12-08 LAB — LIPID PANEL: Cholesterol: 91 mg/dL (ref 0–200)

## 2012-12-08 LAB — BASIC METABOLIC PANEL
Calcium: 9.2 mg/dL (ref 8.4–10.5)
Chloride: 97 mEq/L (ref 96–112)
Creatinine, Ser: 1.41 mg/dL — ABNORMAL HIGH (ref 0.50–1.35)
GFR calc Af Amer: 66 mL/min — ABNORMAL LOW (ref >90)
Sodium: 135 mEq/L (ref 135–145)

## 2012-12-08 LAB — SURGICAL PCR SCREEN: MRSA, PCR: NEGATIVE

## 2012-12-08 LAB — HEMOGLOBIN A1C: Mean Plasma Glucose: 212 mg/dL — ABNORMAL HIGH (ref <117)

## 2012-12-08 LAB — CREATININE, SERUM
GFR calc Af Amer: 56 mL/min — ABNORMAL LOW (ref >90)
GFR calc non Af Amer: 49 mL/min — ABNORMAL LOW (ref >90)

## 2012-12-08 SURGERY — IRRIGATION AND DEBRIDEMENT EXTREMITY
Anesthesia: General | Site: Foot | Laterality: Left | Wound class: Dirty or Infected

## 2012-12-08 MED ORDER — SALINE SPRAY 0.65 % NA SOLN
2.0000 | NASAL | Status: DC | PRN
  Filled 2012-12-08: qty 44

## 2012-12-08 MED ORDER — MIDAZOLAM HCL 5 MG/5ML IJ SOLN
INTRAMUSCULAR | Status: DC | PRN
  Administered 2012-12-08: 2 mg via INTRAVENOUS

## 2012-12-08 MED ORDER — HYDROMORPHONE HCL PF 1 MG/ML IJ SOLN
0.2500 mg | INTRAMUSCULAR | Status: DC | PRN
  Filled 2012-12-08: qty 1

## 2012-12-08 MED ORDER — SALINE SPRAY 0.65 % NA SOLN
1.0000 | NASAL | Status: DC | PRN
  Filled 2012-12-08: qty 44

## 2012-12-08 MED ORDER — VANCOMYCIN HCL IN DEXTROSE 1-5 GM/200ML-% IV SOLN
1000.0000 mg | Freq: Two times a day (BID) | INTRAVENOUS | Status: DC
  Administered 2012-12-08 – 2012-12-10 (×5): 1000 mg via INTRAVENOUS
  Filled 2012-12-08 (×6): qty 200

## 2012-12-08 MED ORDER — VANCOMYCIN HCL 10 G IV SOLR
2000.0000 mg | Freq: Once | INTRAVENOUS | Status: AC
  Administered 2012-12-08: 2000 mg via INTRAVENOUS
  Filled 2012-12-08: qty 2000

## 2012-12-08 MED ORDER — PANTOPRAZOLE SODIUM 40 MG PO TBEC
40.0000 mg | DELAYED_RELEASE_TABLET | Freq: Every day | ORAL | Status: DC
  Administered 2012-12-08 – 2012-12-18 (×10): 40 mg via ORAL
  Filled 2012-12-08 (×10): qty 1

## 2012-12-08 MED ORDER — FENTANYL CITRATE 0.05 MG/ML IJ SOLN
INTRAMUSCULAR | Status: DC | PRN
  Administered 2012-12-08 (×2): 50 ug via INTRAVENOUS

## 2012-12-08 MED ORDER — ZOLPIDEM TARTRATE 5 MG PO TABS
5.0000 mg | ORAL_TABLET | Freq: Once | ORAL | Status: AC
  Administered 2012-12-08: 5 mg via ORAL
  Filled 2012-12-08: qty 1

## 2012-12-08 MED ORDER — SODIUM CHLORIDE 0.9 % IV BOLUS (SEPSIS)
500.0000 mL | Freq: Once | INTRAVENOUS | Status: AC
  Administered 2012-12-08: 500 mL via INTRAVENOUS

## 2012-12-08 MED ORDER — METOCLOPRAMIDE HCL 5 MG/ML IJ SOLN
10.0000 mg | Freq: Once | INTRAMUSCULAR | Status: AC | PRN
  Filled 2012-12-08: qty 2

## 2012-12-08 MED ORDER — ONDANSETRON HCL 4 MG/2ML IJ SOLN
INTRAMUSCULAR | Status: DC | PRN
  Administered 2012-12-08: 4 mg via INTRAVENOUS

## 2012-12-08 MED ORDER — OXYCODONE HCL 5 MG/5ML PO SOLN: 5.0000 mg | Freq: Once | ORAL | Status: AC | PRN

## 2012-12-08 MED ORDER — PHENYLEPHRINE HCL 10 MG/ML IJ SOLN
INTRAMUSCULAR | Status: DC | PRN
  Administered 2012-12-08: 40 ug via INTRAVENOUS
  Administered 2012-12-08 (×2): 80 ug via INTRAVENOUS
  Administered 2012-12-08: 40 ug via INTRAVENOUS
  Administered 2012-12-08 (×2): 80 ug via INTRAVENOUS

## 2012-12-08 MED ORDER — OXYCODONE HCL 5 MG PO TABS
5.0000 mg | ORAL_TABLET | Freq: Once | ORAL | Status: AC | PRN
  Administered 2012-12-08: 5 mg via ORAL
  Filled 2012-12-08: qty 1

## 2012-12-08 MED ORDER — SALINE SPRAY 0.65 % NA SOLN
2.0000 | Freq: Two times a day (BID) | NASAL | Status: DC
  Administered 2012-12-08 – 2012-12-18 (×18): 2 via NASAL
  Filled 2012-12-08 (×3): qty 44

## 2012-12-08 MED ORDER — PROPOFOL 10 MG/ML IV BOLUS
INTRAVENOUS | Status: DC | PRN
  Administered 2012-12-08: 200 mg via INTRAVENOUS

## 2012-12-08 MED ORDER — SODIUM CHLORIDE 0.9 % IR SOLN
Status: DC | PRN
  Administered 2012-12-08: 3000 mL

## 2012-12-08 MED ORDER — ONDANSETRON HCL 4 MG/2ML IJ SOLN: 4.0000 mg | Freq: Once | INTRAMUSCULAR | Status: DC

## 2012-12-08 MED ORDER — LACTATED RINGERS IV SOLN
INTRAVENOUS | Status: DC | PRN
  Administered 2012-12-08: 09:00:00 via INTRAVENOUS

## 2012-12-08 MED ORDER — LIDOCAINE HCL 1 % IJ SOLN
INTRAMUSCULAR | Status: DC | PRN
  Administered 2012-12-08: 100 mg via INTRADERMAL

## 2012-12-08 MED ORDER — PIPERACILLIN-TAZOBACTAM 3.375 G IVPB
3.3750 g | Freq: Three times a day (TID) | INTRAVENOUS | Status: DC
  Administered 2012-12-08 – 2012-12-09 (×6): 3.375 g via INTRAVENOUS
  Filled 2012-12-08 (×9): qty 50

## 2012-12-08 MED ORDER — METOPROLOL TARTRATE 50 MG PO TABS
50.0000 mg | ORAL_TABLET | Freq: Two times a day (BID) | ORAL | Status: DC
Start: 2012-12-08 — End: 2012-12-12
  Administered 2012-12-08 – 2012-12-11 (×8): 50 mg via ORAL
  Filled 2012-12-08 (×10): qty 1

## 2012-12-08 SURGICAL SUPPLY — 68 items
BANDAGE CONFORM 3  STR LF (GAUZE/BANDAGES/DRESSINGS) IMPLANT
BANDAGE ELASTIC 3 VELCRO ST LF (GAUZE/BANDAGES/DRESSINGS) IMPLANT
BANDAGE GAUZE 4  KLING STR (GAUZE/BANDAGES/DRESSINGS) IMPLANT
BANDAGE GAUZE ELAST BULKY 4 IN (GAUZE/BANDAGES/DRESSINGS) ×2 IMPLANT
BLADE AVERAGE 25X9 (BLADE) IMPLANT
BLADE MINI RND TIP GREEN BEAV (BLADE) IMPLANT
BLADE SURG 10 STRL SS (BLADE) ×2 IMPLANT
BNDG COHESIVE 1X5 TAN STRL LF (GAUZE/BANDAGES/DRESSINGS) IMPLANT
BNDG COHESIVE 4X5 TAN STRL (GAUZE/BANDAGES/DRESSINGS) ×2 IMPLANT
BNDG COHESIVE 6X5 TAN STRL LF (GAUZE/BANDAGES/DRESSINGS) ×4 IMPLANT
BNDG ESMARK 4X9 LF (GAUZE/BANDAGES/DRESSINGS) ×2 IMPLANT
BNDG GAUZE STRTCH 6 (GAUZE/BANDAGES/DRESSINGS) ×6 IMPLANT
CLOTH BEACON ORANGE TIMEOUT ST (SAFETY) ×2 IMPLANT
CORDS BIPOLAR (ELECTRODE) ×2 IMPLANT
COVER SURGICAL LIGHT HANDLE (MISCELLANEOUS) ×2 IMPLANT
CUFF TOURNIQUET SINGLE 18IN (TOURNIQUET CUFF) ×2 IMPLANT
CUFF TOURNIQUET SINGLE 24IN (TOURNIQUET CUFF) IMPLANT
CUFF TOURNIQUET SINGLE 34IN LL (TOURNIQUET CUFF) IMPLANT
CUFF TOURNIQUET SINGLE 44IN (TOURNIQUET CUFF) IMPLANT
DRAPE ORTHO SPLIT 77X108 STRL (DRAPES) ×2
DRAPE SURG 17X23 STRL (DRAPES) IMPLANT
DRAPE SURG ORHT 6 SPLT 77X108 (DRAPES) ×2 IMPLANT
DRAPE U-SHAPE 47X51 STRL (DRAPES) ×2 IMPLANT
DURAPREP 26ML APPLICATOR (WOUND CARE) ×2 IMPLANT
ELECT CAUTERY BLADE 6.4 (BLADE) IMPLANT
ELECT REM PT RETURN 9FT ADLT (ELECTROSURGICAL) ×2
ELECTRODE REM PT RTRN 9FT ADLT (ELECTROSURGICAL) ×1 IMPLANT
GAUZE SPONGE 2X2 8PLY STRL LF (GAUZE/BANDAGES/DRESSINGS) IMPLANT
GAUZE XEROFORM 1X8 LF (GAUZE/BANDAGES/DRESSINGS) ×2 IMPLANT
GLOVE BIO SURGEON STRL SZ7.5 (GLOVE) ×2 IMPLANT
GLOVE BIOGEL PI IND STRL 8 (GLOVE) ×2 IMPLANT
GLOVE BIOGEL PI INDICATOR 8 (GLOVE) ×2
GLOVE ORTHO TXT STRL SZ7.5 (GLOVE) ×2 IMPLANT
GOWN PREVENTION PLUS LG XLONG (DISPOSABLE) IMPLANT
GOWN PREVENTION PLUS XLARGE (GOWN DISPOSABLE) ×4 IMPLANT
GOWN STRL NON-REIN LRG LVL3 (GOWN DISPOSABLE) ×2 IMPLANT
HANDPIECE INTERPULSE COAX TIP (DISPOSABLE)
KIT BASIN OR (CUSTOM PROCEDURE TRAY) ×2 IMPLANT
KIT ROOM TURNOVER OR (KITS) ×2 IMPLANT
MANIFOLD NEPTUNE II (INSTRUMENTS) ×2 IMPLANT
NEEDLE HYPO 25GX1X1/2 BEV (NEEDLE) IMPLANT
NS IRRIG 1000ML POUR BTL (IV SOLUTION) ×2 IMPLANT
PACK ORTHO EXTREMITY (CUSTOM PROCEDURE TRAY) ×2 IMPLANT
PAD ARMBOARD 7.5X6 YLW CONV (MISCELLANEOUS) ×4 IMPLANT
PAD CAST 4YDX4 CTTN HI CHSV (CAST SUPPLIES) IMPLANT
PADDING CAST ABS 4INX4YD NS (CAST SUPPLIES) ×2
PADDING CAST ABS COTTON 4X4 ST (CAST SUPPLIES) ×2 IMPLANT
PADDING CAST COTTON 4X4 STRL (CAST SUPPLIES)
PADDING CAST COTTON 6X4 STRL (CAST SUPPLIES) ×2 IMPLANT
SET HNDPC FAN SPRY TIP SCT (DISPOSABLE) IMPLANT
SPECIMEN JAR SMALL (MISCELLANEOUS) ×2 IMPLANT
SPONGE GAUZE 2X2 STER 10/PKG (GAUZE/BANDAGES/DRESSINGS)
SPONGE GAUZE 4X4 12PLY (GAUZE/BANDAGES/DRESSINGS) ×2 IMPLANT
SPONGE LAP 18X18 X RAY DECT (DISPOSABLE) ×2 IMPLANT
STOCKINETTE IMPERVIOUS 9X36 MD (GAUZE/BANDAGES/DRESSINGS) ×2 IMPLANT
SUCTION FRAZIER TIP 10 FR DISP (SUCTIONS) IMPLANT
SUT ETHILON 2 0 FS 18 (SUTURE) ×6 IMPLANT
SUT ETHILON 3 0 PS 1 (SUTURE) ×4 IMPLANT
SUT VIC AB 2-0 FS1 27 (SUTURE) IMPLANT
SYR CONTROL 10ML LL (SYRINGE) IMPLANT
TOWEL OR 17X24 6PK STRL BLUE (TOWEL DISPOSABLE) ×2 IMPLANT
TOWEL OR 17X26 10 PK STRL BLUE (TOWEL DISPOSABLE) ×2 IMPLANT
TUBE ANAEROBIC SPECIMEN COL (MISCELLANEOUS) IMPLANT
TUBE CONNECTING 12X1/4 (SUCTIONS) ×2 IMPLANT
TUBE FEEDING 5FR 15 INCH (TUBING) IMPLANT
UNDERPAD 30X30 INCONTINENT (UNDERPADS AND DIAPERS) ×2 IMPLANT
WATER STERILE IRR 1000ML POUR (IV SOLUTION) ×2 IMPLANT
YANKAUER SUCT BULB TIP NO VENT (SUCTIONS) ×2 IMPLANT

## 2012-12-08 NOTE — Anesthesia Postprocedure Evaluation (Signed)
Anesthesia Post Note  Patient: Albert [name]  Procedure(s) Performed: Procedure(s) (LRB): IRRIGATION AND DEBRIDEMENT EXTREMITY (Left) AMPUTATION DIGIT (Left)  Anesthesia type: General  Patient location: PACU  Post pain: Pain level controlled  Post assessment: Patient's Cardiovascular Status Stable  Last Vitals:  Filed Vitals:   12/08/12 1030  BP: 122/74  Pulse:   Temp: 37.4 C  Resp:     Post vital signs: Reviewed and stable  Level of consciousness: alert  Complications: No apparent anesthesia complications

## 2012-12-08 NOTE — Anesthesia Procedure Notes (Signed)
Procedure Name: LMA Insertion Date/Time: 12/08/2012 9:03 AM Performed by: Angelica Pou Pre-anesthesia Checklist: Patient identified, Timeout performed, Emergency Drugs available, Suction available and Patient being monitored Patient Re-evaluated:Patient Re-evaluated prior to inductionOxygen Delivery Method: Circle system utilized Preoxygenation: Pre-oxygenation with 100% oxygen Intubation Type: IV induction LMA: LMA inserted LMA Size: 4.0 Number of attempts: 1 Placement Confirmation: positive ETCO2 and breath sounds checked- equal and bilateral Tube secured with: Tape Dental Injury: Teeth and Oropharynx as per pre-operative assessment

## 2012-12-08 NOTE — H&P (Signed)
FMTS Attending Admission Note: Albert Don MD Personal pager:  [phone] FPTS Service Pager:  [phone]  I  have seen and examined this patient, reviewed their chart. I have discussed this patient with the resident. I agree with the resident's findings, assessment and care plan.  Additionally: - 52 yo Male with recent 4th toe amputation Left foot, now with worsening healing and dry gangrene.  This AM he is s/p amputation of 3rd toe.   - Likely vasculopath, known HLD, HTN, poorly controlled diabetic.  - Appreciate Ortho care - No blood cultures, continue IV antibiotics until no evidence of worsening infection - For ABI's today.

## 2012-12-08 NOTE — Progress Notes (Signed)
FMTS Attending Daily Note:  Renold Don MD  [phone] pager  Family Practice pager:  [phone] I have seen and examined this patient and have reviewed their chart. I have discussed this patient with the resident. I agree with the resident's findings, assessment and care plan.  See my separate admit note for details

## 2012-12-08 NOTE — Transfer of Care (Signed)
Immediate Anesthesia Transfer of Care Note  Patient: Albert [name]  Procedure(s) Performed: Procedure(s) with comments: IRRIGATION AND DEBRIDEMENT EXTREMITY (Left) AMPUTATION DIGIT (Left) - 3rd toe amputation possible 5th toe amputation  Patient Location: PACU  Anesthesia Type:General  Level of Consciousness: awake, alert , sedated and patient cooperative  Airway & Oxygen Therapy: Patient Spontanous Breathing and Patient connected to nasal cannula oxygen  Post-op Assessment: Report given to PACU RN, Post -op Vital signs reviewed and stable and Patient moving all extremities  Post vital signs: Reviewed and stable  Complications: No apparent anesthesia complications

## 2012-12-08 NOTE — Progress Notes (Signed)
Patient arrived on unit from ED.  Patient alert and oriented, wife at bedside.  BP 154/95, HR 106, T 99.1, R 20, 98% on ra. Patient oriented to unit. Steele Berg RN

## 2012-12-08 NOTE — Brief Op Note (Signed)
12/07/2012 - 12/08/2012  9:52 AM  PATIENT:  Albert [name]  52 y.o. male  PRE-OPERATIVE DIAGNOSIS:  left foot infection  POST-OPERATIVE DIAGNOSIS:  left foot wound and necrosis  PROCEDURE:  Procedure(s) with comments: IRRIGATION AND DEBRIDEMENT EXTREMITY (Left) AMPUTATION DIGIT (Left) - 3rd toe amputation possible 5th toe amputation   3rd and 5th ray resections were performed SURGEON:  Surgeon(s) and Role:    * Kathryne Hitch, MD - Primary  PHYSICIAN ASSISTANT:   ASSISTANTS: none   ANESTHESIA:   general  EBL:  Total I/O In: 0  Out: 500 [Urine:500]  BLOOD ADMINISTERED:none  DRAINS: none   LOCAL MEDICATIONS USED:  NONE  SPECIMEN:  No Specimen  DISPOSITION OF SPECIMEN:  [name]  COUNTS:  YES  TOURNIQUET:  * No tourniquets in log *  DICTATION: .Other Dictation: Dictation Number [id number]  PLAN OF CARE: Admit to inpatient   PATIENT DISPOSITION:  PACU - hemodynamically stable.   Delay start of Pharmacological VTE agent (>24hrs) due to surgical blood loss or risk of bleeding: no

## 2012-12-08 NOTE — Progress Notes (Signed)
Family Medicine Teaching Service Admission H&P Service Pager: [phone]  Patient name: Albert [name] Medical record number: [id number] Date of birth: 1960-11-30 Age: 52 y.o. Gender: male  Primary Care Provider: [name], MD Attending Physician: Tobey Grim, MD Consultants:   Orthopedics: Lysle Rubens Magnus Ivan & Lajoyce Corners)  Vascular Surgery: Consulted directly by Dr. Magnus Ivan (ortho)  CODE STATUS: Full Code  Sunjective  Feeling okay this morning, thirsty, Pain controlled, slept decently BP noted to be up this AM  OBJECTIVE  Vitals: Temp:  [97.6 F (36.4 C)-99.1 F (37.3 C)] 99.1 F (37.3 C) (02/22 0423) Pulse Rate:  [97-108] 108 (02/22 0423) Resp:  [18-20] 20 (02/22 0423) BP: (100-163)/(64-106) 144/82 mmHg (02/22 0518) SpO2:  [98 %-100 %] 98 % (02/22 0423) Weight:  [245 lb 11.2 oz (111.449 kg)] 245 lb 11.2 oz (111.449 kg) (02/21 2315)  Weight: Wt Readings from Last 3 Encounters:  12/07/12 245 lb 11.2 oz (111.449 kg)  12/07/12 245 lb 11.2 oz (111.449 kg)    I&Os: Yesterday: 02/21 0701 - 02/22 0700 In: 87.3 [I.V.:87.3] Out: 725 [Urine:725] This shift:     PE: GENERAL:  Adult obese caucasian  male. In no discomfort; no respiratory distress. PSYCH: Alert and appropriately interactive; Insight:Good   H&N: AT/[place], trachea midline; TM clear, no fluid; high arched palate, some nasal congestion EENT:  MMM, no scleral icterus, EOMi HEART: RRR, S1/S2 heard, no murmur LUNGS: CTA B, no wheezes, no crackles EXTREMITIES: Moves all 4 extremities spontaneously, R hand with claw deformation and associated hemiparesis, warm well perfused, no edema, non-palpable DP & TP on Left, trace DP on R.  EDP reports dopplerable pulses in BLE    LABS: CBC BMET   Recent Labs Lab 12/07/12 2041 12/08/12 0004 12/08/12 0555  WBC 14.3* 13.8* 12.9*  HGB 12.8* 12.5* 12.6*  HCT 36.4* 36.5* 36.5*  PLT 265 265 281    Recent Labs Lab 12/07/12 2041 12/08/12 0004 12/08/12 0555  NA  131*  --  135  K 4.1  --  3.6  CL 94*  --  97  CO2 21  --  26  BUN 24*  --  23  CREATININE 1.67* 1.60* 1.41*  GLUCOSE 229*  --  138*  CALCIUM 8.9  --  9.2      12/08/2012 05:55  Cholesterol 91  Triglycerides 73  HDL 22 (L)  LDL (calc) 54  VLDL 15  Total CHOL/HDL Ratio 4.1   URINE STUDIES: None  MICRO: None - Emperic ABX started >4 hour prior to eval  IMAGING: Dg Foot Complete Left  12/07/2012  *RADIOLOGY REPORT*  Clinical Data: Toe injury  LEFT FOOT - COMPLETE 3+ VIEW  Comparison: 11/08/2012  Findings: Three views of the left foot submitted.  Stable chronic deformity of the proximal phalanx fifth toe. There is soft tissue swelling fifth toe.  The patient is status post amputation of the fourth toe.  There is probable prior partial resection of distal aspect of the fourth metatarsal.  There is cortical irregularity distal aspect of the fourth metatarsal.  Osteomyelitis cannot be excluded. Clinical correlation is necessary.  No acute fracture or subluxation.  IMPRESSION: Stable chronic deformity of the proximal phalanx fifth toe. There is soft tissue swelling fifth toe.  The patient is status post amputation of the fourth toe.  There is probable prior partial resection of distal aspect of the fourth metatarsal.  There is cortical irregularity distal aspect of the fourth metatarsal. Osteomyelitis cannot be excluded. Clinical correlation is necessary.  No acute fracture or subluxation.   Original Report Authenticated By: Natasha Mead, M.D.    EKG: Tachycardic, incomplete interventricular conduction delay, No ST changes, no T wave inversion, no q waves  Medications:   . sodium chloride 20 mL/hr at 12/08/12 0334   . aspirin EC  81 mg Oral Daily  . heparin  5,000 Units Subcutaneous Q8H  . insulin aspart  0-20 Units Subcutaneous Q4H  . insulin glargine  10 Units Subcutaneous QHS  . metoprolol tartrate  50 mg Oral BID  . pantoprazole  40 mg Oral Daily  . piperacillin-tazobactam (ZOSYN)  IV   3.375 g Intravenous Q8H  . simvastatin  40 mg Oral QPM  . sodium chloride  2 spray Each Nare BID  . vancomycin  1,000 mg Intravenous Q12H   HYDROmorphone (DILAUDID) injection, ondansetron (ZOFRAN) IV, sodium chloride, sodium chloride  Assessment & Plan  LOS: [age] y.o. year old male with gangrene of L 3rd toe s/p prior 4th toe excision with poor healing and local tissue necrosis & rubor.  Subjective fevers.  Poor blood flow likely due to combination of microvascular and large vessel disease disease 2o/2 poorly controlled DM and likely atherosclerosis given hx of prior CVA.  # Gangrene & Leukocytosis & Diabetic Peripheral Neuropathy: Dr. Magnus Ivan (ortho) has evaluated and will plan for surgery today with Vascular Surgery.  No blood cultures obtained, has been >4 hours after initiation of ABX  Vanco & Zosyn per pharmacy  Dilaudid for pain  Consider Neurontin if pain worsens  # Cardiac & HTN: Will continue home meds.  EKG for pre-operative evaluation still pending.  Review before Surgery. On BB at home continue for operative risk reduction.  Monitor Renal function given ARB. High functional status, no resp/cardiac limitations activity level able to walk ~78mile without difficulty prior to foot   Lopressor, increased dose today  EKG okay  Hold HCTZ and ARB  # Peripheral Vascular Disease: likely combination of microvascular and large vessel disease.  Vascular to evaluate.  Consider intervention  ABI pre-op although will likely be inaccurate given diabetic  # Nasal Congestion: pt with high arched palate, chronic sinus disease.  Would likely benefit from trial of Nasal Corticosteroids as OP.  Will provide Ocean Nasal Spray.  Given duration of sx could consider infection but Vanco/Zosyn will adequately cover any potential infection  Ocean Nasal Spray scheduled  # Kidney Disease (likely chronic): no baseline level.  Improving.  Will provide IVFs given NPO and hold HCTZ and ARB    # DM & HLD: Will  obtain risk stratification labs.  (Lipid, A1c)  Statin,   Lantus 10 + SSI Resistant q4o (NPO for surgery)  # Nausea: Chronic as Zofran on home med list.  Consider diabetic gastroparesis. Will trial PPI  Zofran  Protonix  --- FEN  *NS BOLUS X 500cc then 150cc/hr until after surger -NPO after midnight except meds --- PPx: Heparin    Disposition  To OR today.  Consider prolonged ABX after surgery.   Andrena Mews, DO [facility] Resident - PGY-2  12/08/2012 8:06 AM

## 2012-12-08 NOTE — Preoperative (Addendum)
Beta Blockers   Reason not to administer Beta Blockers:Not Applicable, took Metoprolol 02/22 0107.

## 2012-12-08 NOTE — Progress Notes (Signed)
ANTIBIOTIC CONSULT NOTE - INITIAL  Pharmacy Consult for vancomycin, Zosyn Indication: Left foot necrotic 3rd toe  Allergies  Allergen Reactions  . Amoxicillin     vomiting    Patient Measurements: Height: 5\' 11"  (180.3 cm) Weight: 245 lb 11.2 oz (111.449 kg) IBW/kg (Calculated) : 75.3 Vital Signs: Temp: 98.3 F (36.8 C) (02/21 2315) Temp src: Oral (02/21 2315) BP: 154/95 mmHg (02/21 2315) Pulse Rate: 106 (02/21 2315) Intake/Output from previous day:   Intake/Output from this shift:    Labs:  Recent Labs  12/07/12 2041  WBC 14.3*  HGB 12.8*  PLT 265  CREATININE 1.67*   Estimated Creatinine Clearance: 67.1 ml/min (by C-G formula based on Cr of 1.67). No results found for this basename: VANCOTROUGH, VANCOPEAK, VANCORANDOM, GENTTROUGH, GENTPEAK, GENTRANDOM, TOBRATROUGH, TOBRAPEAK, TOBRARND, AMIKACINPEAK, AMIKACINTROU, AMIKACIN,  in the last 72 hours   Microbiology: No results found for this or any previous visit (from the past 720 hour(s)).  Medical History: Past Medical History  Diagnosis Date  . Hypertension   . Diabetes mellitus without complication ~2000    last HbA1c ~9  . Hypercholesteremia   . CVA (cerebral infarction) 2011    R hand deficit  . Chronic sinusitis   . Allergic rhinitis   . Chronic cough     Medications:  Scheduled:  . aspirin EC  81 mg Oral Daily  . heparin  5,000 Units Subcutaneous Q8H  . irbesartan  300 mg Oral Daily   And  . hydrochlorothiazide  12.5 mg Oral Daily  . [COMPLETED]  HYDROmorphone (DILAUDID) injection  0.5 mg Intravenous Once  . insulin aspart  0-20 Units Subcutaneous Q4H  . insulin glargine  10 Units Subcutaneous QHS  . metoprolol tartrate  25 mg Oral BID  . [COMPLETED] ondansetron (ZOFRAN) IV  4 mg Intravenous Once  . [COMPLETED] piperacillin-tazobactam  3.375 g Intravenous Once  . simvastatin  40 mg Oral QPM  . vancomycin  1,000 mg Intravenous Once  . [DISCONTINUED] irbesartan-hydrochlorothiazide  1 tablet  Oral Daily   Assessment: 52 yo male presented with left foot necrotic 3rd toe. Pharmacy to manage Zosyn and vancomycin. Patient has received Zosyn 3.375gm IV x 1.   Goal of Therapy:  Vancomycin trough 10-20 mcg/mL  Plan:  1. Vancomycin 2gm IV x 1, then 1gm Q12H. 2. Zosyn 3.375gm IV Q8H (4 hr infusion)  Emeline Gins 12/08/2012,12:29 AM

## 2012-12-08 NOTE — Op Note (Signed)
[name], [name] NO.:  [id number]  MEDICAL RECORD NO.:  [id number]  LOCATION:  6710                         FACILITY:  [facility]  PHYSICIAN:  Albert [name], M.D.DATE OF BIRTH:  1961/05/15  DATE OF PROCEDURE:  12/08/2012 DATE OF DISCHARGE:                              OPERATIVE REPORT   PREOPERATIVE DIAGNOSIS:  Left foot with necrotic 3rd toe open wound over previous 4th toe amputation and severely dusky 5th toe.  POSTOPERATIVE DIAGNOSIS:  Left foot with necrotic 3rd toe open wound over previous 4th toe amputation and severely dusky 5th toe.  PROCEDURE: 1. Extensive irrigation and debridement of right forefoot. 2. Left 3rd and 5th ray resections.  SURGEON:  Albert [name], M.D.  ANESTHESIA:  General.  ESTIMATED BLOOD LOSS:  Minimal and disappointing.  COMPLICATIONS:  None.  INDICATIONS:  [name] is a 52 year old gentleman with poorly controlled diabetes who let me know this hemoglobin A1c has been over 10 times about a month ago indeed and he had a 4th toe amputation secondary to necrosis.  He has had chronic open wound from that and he is being referred down here to the wound center as well as to the vascular surgeons to see about a possible intervention, they could help improve blood flow to the foot.  In the interim, he has developed necrosis of his 3rd toe and the wounds on his 5th toe.  At this point, given the nature of the wound, he needs a thorough irrigation and debridement of the forefoot to the lateral side, as well as the 3rd and 5th ray resections and cleaning up the 4th resection.  The challenges is going to be get some healing to this with his poor circulation.  Vascular surgery will be consulted as well.  He understands risks and benefits of surgery well we need to proceed.  PROCEDURE DESCRIPTION:  After informed consent was obtained, appropriate left foot was marked.  He was brought to the operating room,  placed supine on the operating table.  General anesthesia was then obtained. His left foot was prepped and draped from the toes to the knee with Betadine scrub and paint.  A time-out was called and he identified as correct patient, correct left foot and packed the wound and found a significant necrosis using a #10 blade to go wedge out of the forefoot from the 3rd ray down to the 5th ray.  I used bone cutting forceps and a rongeur to back the metatarsals up, so this did involve significant debridement of skin, soft tissue, muscle, and bone.  I did not use the tourniquet and there was no significant blood flow at all and just a little bit to the 5th ray.  I then used pulsatile lavage to completely lavage the wound.  I closed the skin as a flap with interrupted loose 2- 0 nylon suture.  There was a small area by the 2nd toe laterally that I could not close.  We placed Xeroform and well-padded sterile dressing around this.  He was awakened, extubated, and taken to recovery room in stable condition.  All final counts correct.  There were no complications noted.  Postoperatively, he will  be evaluated by the vascular surgeons and will likely need some type of further intervention to help with healing and with the possibility he may end up with a transmetatarsal amputation at the minimal.     Albert [name], M.D.     CYB/MEDQ  D:  12/08/2012  T:  12/08/2012  Job:  [id number]

## 2012-12-08 NOTE — Anesthesia Preprocedure Evaluation (Signed)
Anesthesia Evaluation  Patient identified by MRN, date of birth, ID band Patient awake    Reviewed: Allergy & Precautions, H&P , NPO status , Patient's Chart, lab work & pertinent test results, reviewed documented beta blocker date and time   Airway Mallampati: II TM Distance: >3 FB Neck ROM: full    Dental   Pulmonary neg pulmonary ROS,  breath sounds clear to auscultation        Cardiovascular hypertension, On Medications and On Home Beta Blockers + Peripheral Vascular Disease Rhythm:regular Rate:Tachycardia     Neuro/Psych CVA negative psych ROS   GI/Hepatic negative GI ROS, Neg liver ROS,   Endo/Other  diabetes, Insulin Dependent  Renal/GU negative Renal ROS  negative genitourinary   Musculoskeletal   Abdominal   Peds  Hematology negative hematology ROS (+)   Anesthesia Other Findings See surgeon's H&P   Reproductive/Obstetrics negative OB ROS                           Anesthesia Physical Anesthesia Plan  ASA: III  Anesthesia Plan: General   Post-op Pain Management:    Induction: Intravenous  Airway Management Planned: LMA  Additional Equipment:   Intra-op Plan:   Post-operative Plan: Extubation in OR  Informed Consent: I have reviewed the patients History and Physical, chart, labs and discussed the procedure including the risks, benefits and alternatives for the proposed anesthesia with the patient or authorized representative who has indicated his/her understanding and acceptance.   Dental Advisory Given  Plan Discussed with: CRNA and Surgeon  Anesthesia Plan Comments:         Anesthesia Quick Evaluation

## 2012-12-09 DIAGNOSIS — I96 Gangrene, not elsewhere classified: Secondary | ICD-10-CM

## 2012-12-09 DIAGNOSIS — I70269 Atherosclerosis of native arteries of extremities with gangrene, unspecified extremity: Secondary | ICD-10-CM

## 2012-12-09 LAB — CBC
Hemoglobin: 11.4 g/dL — ABNORMAL LOW (ref 13.0–17.0)
RBC: 4.3 MIL/uL (ref 4.22–5.81)

## 2012-12-09 LAB — BASIC METABOLIC PANEL
CO2: 26 mEq/L (ref 19–32)
Glucose, Bld: 104 mg/dL — ABNORMAL HIGH (ref 70–99)
Potassium: 3.5 mEq/L (ref 3.5–5.1)
Sodium: 132 mEq/L — ABNORMAL LOW (ref 135–145)

## 2012-12-09 LAB — GLUCOSE, CAPILLARY
Glucose-Capillary: 108 mg/dL — ABNORMAL HIGH (ref 70–99)
Glucose-Capillary: 111 mg/dL — ABNORMAL HIGH (ref 70–99)
Glucose-Capillary: 259 mg/dL — ABNORMAL HIGH (ref 70–99)
Glucose-Capillary: 265 mg/dL — ABNORMAL HIGH (ref 70–99)
Glucose-Capillary: 278 mg/dL — ABNORMAL HIGH (ref 70–99)
Glucose-Capillary: 303 mg/dL — ABNORMAL HIGH (ref 70–99)

## 2012-12-09 MED ORDER — LORAZEPAM 0.5 MG PO TABS
0.5000 mg | ORAL_TABLET | Freq: Once | ORAL | Status: AC
  Administered 2012-12-09: 0.5 mg via ORAL
  Filled 2012-12-09: qty 1

## 2012-12-09 MED ORDER — LORAZEPAM 1 MG PO TABS
1.0000 mg | ORAL_TABLET | Freq: Once | ORAL | Status: AC
  Administered 2012-12-09: 1 mg via ORAL
  Filled 2012-12-09: qty 1

## 2012-12-09 MED ORDER — INSULIN GLARGINE 100 UNIT/ML ~~LOC~~ SOLN
12.0000 [IU] | Freq: Every day | SUBCUTANEOUS | Status: DC
  Administered 2012-12-09 – 2012-12-10 (×2): 12 [IU] via SUBCUTANEOUS

## 2012-12-09 NOTE — Progress Notes (Signed)
Orthopedic Tech Progress Note Patient Details:  Albert [name] [birth year] [id number]  Ortho Devices Type of Ortho Device: Darco shoe Ortho Device/Splint Interventions: Application   Shawnie Pons 12/09/2012, 10:23 AM

## 2012-12-09 NOTE — Consult Note (Addendum)
[facility] Consult  Reason for Consult:  PAD Referring Physician:  Magnus Ivan  [id number]  History of Present Illness: This is a 52 y.o. male who has history of DM, hypertension, hyperlipidemia, as well as CVA with right hand deficit who presented with a toe ulcer and necrotic changes as well as increased drainage of his left 3rd toe.  He recently underwent a left 4th toe amputation ~ 1 month ago.  Denies tobacco use.    Dr. Magnus Ivan took the pt to the OR 12/08/12, where he underwent extensive irrigation and debridement of right forefoot and left 3rd and 5th ray amputation.  During the operation, there was minimal bleeding.  VVS is consulted.  The patient is a brittle diabetic which is not well managed. His last hemoglobin A1c was greater than 9. He is also medically treated for his hypertension, and hypercholesterolemia for which he is on statin.  Past Medical History  Diagnosis Date  . Hypertension   . Diabetes mellitus without complication ~2000    last HbA1c ~9  . Hypercholesteremia   . CVA (cerebral infarction) 2011    R hand deficit  . Chronic sinusitis   . Allergic rhinitis   . Chronic cough    Past Surgical History  Procedure Laterality Date  . 4th toe amputation    . Tonsillectomy and adenoidectomy    . Penile prosthesis implant      Allergies  Allergen Reactions  . Amoxicillin     vomiting    Prior to Admission medications   Medication Sig Start Date End Date Taking? Authorizing Provider  aspirin 81 MG tablet Take 81 mg by mouth daily.   Yes Historical Provider, MD  glyBURIDE-metformin (GLUCOVANCE) 2.5-500 MG per tablet Take 1 tablet by mouth daily with breakfast.   Yes Historical Provider, MD  HYDROcodone-acetaminophen (NORCO/VICODIN) 5-325 MG per tablet Take 1 tablet by mouth every 6 (six) hours as needed for pain.   Yes Historical Provider, MD  insulin aspart (NOVOLOG FLEXPEN) 100 UNIT/ML injection Inject 12 Units into the skin 3 (three) times  daily. Take every time he eats per patient   Yes Historical Provider, MD  insulin detemir (LEVEMIR) 100 UNIT/ML injection Inject 20 Units into the skin every evening.   Yes Historical Provider, MD  irbesartan-hydrochlorothiazide (AVALIDE) 300-12.5 MG per tablet Take 1 tablet by mouth daily.   Yes Historical Provider, MD  metoprolol tartrate (LOPRESSOR) 25 MG tablet Take 25 mg by mouth 2 (two) times daily.   Yes Historical Provider, MD  Nutritional Supplements (EQUATE PO) Take 1 tablet by mouth daily.   Yes Historical Provider, MD  ondansetron (ZOFRAN) 8 MG tablet Take by mouth every 8 (eight) hours as needed for nausea. For upset stomach   Yes Historical Provider, MD  simvastatin (ZOCOR) 40 MG tablet Take 40 mg by mouth every evening.   Yes Historical Provider, MD  traMADol (ULTRAM) 50 MG tablet Take 50 mg by mouth every 6 (six) hours as needed for pain. For pain   Yes Historical Provider, MD    History   Social History  . Marital Status: Married    Spouse Name: [name]    Number of Children: [name]  . Years of Education: [name]   Occupational History  . Not on file.   Social History Main Topics  . Smoking status: Never Smoker   . Smokeless tobacco: Not on file  . Alcohol Use: No  . Drug Use: No  . Sexually Active: Not on file  Other Topics Concern  . Not on file   Social History Narrative   Lives in [place]   Wife: [name]    Family History  Problem Relation Age of Onset  . Diabetes    . Pancreatic cancer Mother   . Breast cancer Sister     ROS: [x]  Positive   [ ]  Negative   [ ]  All sytems reviewed and are negative  General: [ ]  Weight loss, [ ]  Weight gain, [ ]   Loss of appetite, [ ]  Fever Neurologic: [ ]  Dizziness, [ ]  Blackouts, [ ]  Headaches, [ ]  Seizure, [x] Stroke, [ ]  "Mini stroke", [ ]  Slurred speech, [ ]  Temporary blindness Ear/Nose/Throat: [ ]  Change in eyesight, [ ]  Change in hearing, [ ]  Nose bleeds, [ ]  Sore throat Vascular: [ ]  Pain in legs with walking, [ ]  Pain in  feet while lying flat, [x ] Non-healing ulcer,  [ ]  Blood clot in vein, [ ]  Phlebitis Pulmonary: [ ]  Home oxygen, [x ] chronic cough, [ ]  Bronchitis, [ ]  Coughing up blood,  [ ]  Asthma, [ ]  Wheezing Musculoskeletal: [ ]  Arthritis, [ ]  Joint pain, [ ]  Muscle pain Cardiac: [ ]  Chest pain, [ ]  Chest tightness/pressure, [ ]  Shortness of breath when lying flat, [ ]  Shortness of breath with exertion, [ ]  Palpitations, [ ]  Heart murmur, [ ]  Arrythmia,  [ ]  Atrial fibrillation Hematologic: [ ]  Bleeding problems, [ ]  Clotting disorder, [ ]  Anemia Psychiatric:  [ ]  Depression, [ ]  Anxiety Gastrointestinal:  [ ]  Black stool,[ ]   Blood in stool, [ ]  Peptic ulcer disease, [ ]  Reflux, [ ]  Hiatal hernia, [ ]  Trouble swallowing, [ ]  Diarrhea, [ ]  Constipation Urinary:  [ ]  Kidney disease, [ ]  Burning with urination, [ ]  nocturia, [ ]  Difficulty urinating Endocrine: [x ] hx diabetes, [ ]  hx thyroid disease Skin: [x ] Ulcers, [ ]  Rashes [x]  left foot wrapped-dressing clean and dry   Physical Examination  Filed Vitals:   12/09/12 0442  BP: 108/72  Pulse: 86  Temp: 98.9 F (37.2 C)  Resp: 20   Body mass index is 34.28 kg/(m^2).  General:  WDWN in NAD Gait: Not observed HENT: WNL Eyes: Pupils equal Pulmonary: normal non-labored breathing , without Rales, rhonchi,  wheezing Cardiac: RRR, without  Murmurs, rubs or gallops Abdomen: soft, NT, no masses Skin: left foot with surgical bandage that is c/d/i; chronic skin changes to RLE around the ankle area Vascular Exam/Pulses:+ palpable femoral pulses bilaterally Extremities: left foot with surgical 3rd and 5th ray toe amp with dressing in place Musculoskeletal: no muscle wasting or atrophy  Neurologic: A&O X 3; Appropriate Affect ; SENSATION: normal; MOTOR FUNCTION:  moving all extremities equally. Speech is fluent/normal   CBC    Component Value Date/Time   WBC 12.9* 12/09/2012 0457   RBC 4.30 12/09/2012 0457   HGB 11.4* 12/09/2012 0457   HCT  33.7* 12/09/2012 0457   PLT 267 12/09/2012 0457   MCV 78.4 12/09/2012 0457   MCH 26.5 12/09/2012 0457   MCHC 33.8 12/09/2012 0457   RDW 13.1 12/09/2012 0457    BMET    Component Value Date/Time   NA 132* 12/09/2012 0457   K 3.5 12/09/2012 0457   CL 97 12/09/2012 0457   CO2 26 12/09/2012 0457   GLUCOSE 104* 12/09/2012 0457   BUN 16 12/09/2012 0457   CREATININE 1.03 12/09/2012 0457   CALCIUM 8.5 12/09/2012 0457   GFRNONAA 83* 12/09/2012 0457  GFRAA >90 12/09/2012 0457     Non-Invasive Vascular Imaging:  ABI's 12/09/12   RIGHT    LEFT     PRESSURE  WAVEFORM   PRESSURE  WAVEFORM   BRACHIAL  138  T  BRACHIAL  144  T   DP    DP     AT  159  M  AT  95  DM   PT  175  M  PT  123  DM   PER    PER     GREAT TOE   NA  GREAT TOE   NA     RIGHT  LEFT   ABI  >1.0  0.85      ASSESSMENT/PLAN: This is a 52 y.o. male with hx of DM and hypertension.  Recent left 3rd and 5th toe ray amputation with minimal bleeding and probable PAD  -ABI's performed this am are probably falsely elevated due to his diabetes -recommend aortogram with BLE runoff to evaluate blood flow.  May possibly need intervention or possible bypass. -will schedule this for Monday or Tuesday.  Dr. Myra Gianotti discussed this with pt and he understands and agrees.   Doreatha Massed, PA-C [facility] [phone]   I agree with the above. The patient has been seen and evaluated. He underwent ray amputation by Dr. Rayburn Ma yesterday of left foot digits. There was minimal bleeding at the time of his amputation. Patient underwent a duplex evaluation today which reveals the following:  Preliminary report: Thee is moderate calcific plaque noted throughout the common femoral, femoral and popliteal and anterior tibial arteries and moderate to severe plaque noted in the posterior tibial artery. No significant areas of stenosis noted. Biphasic waveforms noted in the CFA and FA, monophasic waveforms noted in the Popliteal, AT and PT  arteries.  With the operative findings as well as a duplex information, the patient is at risk for more proximal amputation. I discussed this with him today. I feel the next step is to proceed with angiography. At that time percutaneous intervention will be performed if possible, otherwise he may require surgical revascularization. The risks and benefits were discussed with the patient he is willing to proceed. Over this will be done tomorrow. If this done Monday it will be done by one of my partners. Otherwise, I will be available to perform the procedure on Tuesday.  Durene Cal

## 2012-12-09 NOTE — Progress Notes (Signed)
FMTS Attending Daily Note:  Renold Don MD  [phone] pager  Family Practice pager:  [phone] I have seen and examined this patient and have reviewed their chart. I have discussed this patient with the resident. I agree with the resident's findings, assessment and care plan.  Additionally: - Continues to improve.  Vascular will evidently take back for arteriogram Monday or Tuesday.  No evidence of fevers/leukocytosis relatively stable, can likely DC antibiotics after afebrile x 48 hours since surgery.

## 2012-12-09 NOTE — Progress Notes (Signed)
VASCULAR LAB PRELIMINARY  PRELIMINARY  PRELIMINARY  PRELIMINARY  Left lower extremity arterial duplex completed.    Preliminary report:  Thee is moderate calcific plaque noted throughout the common femoral, femoral and popliteal and anterior tibial arteries and moderate to severe plaque noted in the posterior tibial artery.  No significant areas of stenosis noted.  Biphasic waveforms noted in the CFA and FA, monophasic waveforms noted in the Popliteal, AT and PT arteries.  Karalina Tift, RVT 12/09/2012, 11:36 AM

## 2012-12-09 NOTE — Progress Notes (Signed)
VASCULAR LAB PRELIMINARY  ARTERIAL  ABI completed:    RIGHT    LEFT    PRESSURE WAVEFORM  PRESSURE WAVEFORM  BRACHIAL 138  T BRACHIAL 144 T  DP   DP    AT 159 M AT 95 DM  PT 175 M PT 123 DM  PER   PER    GREAT TOE  NA GREAT TOE  NA    RIGHT LEFT  ABI >1.0 0.85     Albert [name], RVT 12/09/2012, 9:07 AM

## 2012-12-09 NOTE — Progress Notes (Signed)
Per Brabham MD, hold blood thinners in AM prior to abdominal aortogram procedure. Albert [name]

## 2012-12-09 NOTE — Progress Notes (Signed)
FMTS Daily Intern Progress Note: [id number]  Subjective:  Post op day 1 left 3rd and 5th ray amputation Pain controled with dilaudid x1 and oxycodone x1 No n/v. Eating well. No fevers, no chills.   I have reviewed the patient's medications.  Objective Temp:  [98.9 F (37.2 C)-99.7 F (37.6 C)] 98.9 F (37.2 C) (02/23 0442) Pulse Rate:  [86-104] 86 (02/23 0442) Resp:  [20] 20 (02/23 0442) BP: (108-171)/(68-92) 108/72 mmHg (02/23 0442) SpO2:  [95 %-98 %] 95 % (02/23 0442) Weight:  [245 lb 11.2 oz (111.449 kg)] 245 lb 11.2 oz (111.449 kg) (02/22 1956)   Intake/Output Summary (Last 24 hours) at 12/09/12 1057 Last data filed at 12/09/12 0446  Gross per 24 hour  Intake   1270 ml  Output   1354 ml  Net    -84 ml    CBG (last 3)   Recent Labs  12/08/12 2351 12/09/12 0445 12/09/12 0819  GLUCAP 213* 108* 111*    General: no acute distress HEENT: moist mucous membranes, PERRLA, EOMI CV: S1S2, RRR, no murmur Pulm: cta b/l Ext: left foot s/p 3rd and 5th toe amputations, dressing clean and dry Neuro: no focal deficits.   Labs and Imaging  Recent Labs Lab 12/08/12 0004 12/08/12 0555 12/09/12 0457  WBC 13.8* 12.9* 12.9*  HGB 12.5* 12.6* 11.4*  HCT 36.5* 36.5* 33.7*  PLT 265 281 267     Recent Labs Lab 12/07/12 2041 12/08/12 0004 12/08/12 0555 12/09/12 0457  NA 131*  --  135 132*  K 4.1  --  3.6 3.5  CL 94*  --  97 97  CO2 21  --  26 26  BUN 24*  --  23 16  CREATININE 1.67* 1.60* 1.41* 1.03  GLUCOSE 229*  --  138* 104*  CALCIUM 8.9  --  9.2 8.5     ABI: >1.0 on right, 0.85 on left.   Assessment and Plan 52 y.o. 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 Poor blood flow likely due to combination of microvascular and large vessel disease disease 2o/2 poorly controlled DM and likely atherosclerosis given hx of prior CVA  # left 3rd and 5th toe necrosis: post op day 1 s/p  3rd and 5th toe ray amputation.  - continue pain control - tachycardic without fever and stable WBC at 12.9. Monitor for fever and WBC. On vanc and zosyn. Consider d/cing since afebrile with stable WBC. F/u ortho recs.   # peripheral vascular disease: evaluated by vascular surgery who recommends aortogram with BLE runoff Monday or Tuesday  # HTN: on metoprolol 50mg  bid. Home irbesartan/HCTZ 300/12.5mg  held. In context of BP's in 110-120's and recent acute renal failure, will continue to hold.   # Acute renal failure: most recent cr: 0.66 in 2012.  Creatinine improving today at 1.03 from 1.41.  - continue holding ARB and HCTZ  # DM: A1C: 9.0. On levemir 20qhs at home with novlolg 12 tid with meals and glipizide/metformin - on lantus 10u in hospital and requiring 40u novolog in 24hrs. Increase lantus to 12u.   FEN: carb modified PPx: heparin 5000 tid Dispo: pending improvement and vascular eval.   Marena Chancy, PGY-2 Family Medicine Resident

## 2012-12-10 ENCOUNTER — Encounter (HOSPITAL_COMMUNITY): Payer: Self-pay | Admitting: Orthopaedic Surgery

## 2012-12-10 ENCOUNTER — Encounter (HOSPITAL_COMMUNITY)
Admission: EM | Disposition: A | Discharge status: Home / Self Care | Payer: Self-pay | Source: Home / Self Care | Attending: Family Medicine

## 2012-12-10 LAB — BASIC METABOLIC PANEL
BUN: 15 mg/dL (ref 6–23)
Chloride: 96 mEq/L (ref 96–112)
Creatinine, Ser: 0.88 mg/dL (ref 0.50–1.35)
GFR calc Af Amer: >90 mL/min (ref >90)

## 2012-12-10 LAB — GLUCOSE, CAPILLARY
Glucose-Capillary: 167 mg/dL — ABNORMAL HIGH (ref 70–99)
Glucose-Capillary: 214 mg/dL — ABNORMAL HIGH (ref 70–99)
Glucose-Capillary: 218 mg/dL — ABNORMAL HIGH (ref 70–99)
Glucose-Capillary: 229 mg/dL — ABNORMAL HIGH (ref 70–99)

## 2012-12-10 LAB — CBC
HCT: 32.8 % — ABNORMAL LOW (ref 39.0–52.0)
MCHC: 33.2 g/dL (ref 30.0–36.0)
MCV: 79.2 fL (ref 78.0–100.0)
RDW: 13.2 % (ref 11.5–15.5)

## 2012-12-10 SURGERY — ABDOMINAL ANGIOGRAM

## 2012-12-10 MED ORDER — FENTANYL CITRATE 0.05 MG/ML IJ SOLN
INTRAMUSCULAR | Status: AC
  Filled 2012-12-10: qty 2

## 2012-12-10 MED ORDER — ENOXAPARIN SODIUM 30 MG/0.3ML ~~LOC~~ SOLN
40.0000 mg | SUBCUTANEOUS | Status: DC
  Administered 2012-12-11 – 2012-12-12 (×2): 40 mg via SUBCUTANEOUS
  Filled 2012-12-10 (×4): qty 0.4

## 2012-12-10 MED ORDER — MIDAZOLAM HCL 2 MG/2ML IJ SOLN
INTRAMUSCULAR | Status: AC
  Filled 2012-12-10: qty 2

## 2012-12-10 MED ORDER — ONDANSETRON HCL 4 MG/2ML IJ SOLN: 4.0000 mg | Freq: Four times a day (QID) | INTRAMUSCULAR | Status: DC | PRN

## 2012-12-10 MED ORDER — LIVING WELL WITH DIABETES BOOK
Freq: Once | Status: AC
  Administered 2012-12-10: 15:00:00
  Filled 2012-12-10: qty 1

## 2012-12-10 MED ORDER — ACETAMINOPHEN 325 MG PO TABS
650.0000 mg | ORAL_TABLET | ORAL | Status: DC | PRN
  Administered 2012-12-14: 650 mg via ORAL
  Filled 2012-12-10: qty 2

## 2012-12-10 MED ORDER — LABETALOL HCL 5 MG/ML IV SOLN
INTRAVENOUS | Status: AC
  Filled 2012-12-10: qty 4

## 2012-12-10 MED ORDER — SODIUM CHLORIDE 0.9 % IV SOLN: 1.0000 mL/kg/h | INTRAVENOUS | Status: AC

## 2012-12-10 NOTE — Progress Notes (Signed)
Pt given 4 units of novolog at 0419. Per MD order, pt was to not receive hypoglycemic medication prior to procedure in AM. CBG rechecked at 5:03am. CBG=167. Will continue to monitor and will notify OR.  Gilman Schmidt

## 2012-12-10 NOTE — Progress Notes (Signed)
FMTS Daily Intern Progress Note: [id number]  Subjective:  Post op day 2 left 3rd and 5th ray amputation Pain controled with dilaudid No N/V/D, no CP, SOB  I have reviewed the patient's medications.  Objective Temp:  [98.1 F (36.7 C)-99.1 F (37.3 C)] 98.6 F (37 C) (02/24 0411) Pulse Rate:  [20-96] 87 (02/24 0411) Resp:  [20] 20 (02/24 0411) BP: (114-140)/(75-78) 121/78 mmHg (02/24 0411) SpO2:  [96 %-98 %] 97 % (02/24 0411) Weight:  [245 lb 11.2 oz (111.449 kg)] 245 lb 11.2 oz (111.449 kg) (02/23 2025)   Intake/Output Summary (Last 24 hours) at 12/10/12 0836 Last data filed at 12/10/12 0415  Gross per 24 hour  Intake    720 ml  Output      0 ml  Net    720 ml    CBG (last 3)   Recent Labs  12/09/12 2355 12/10/12 0414 12/10/12 0503  GLUCAP 259* 179* 167*    General: no acute distress HEENT: moist mucous membranes, PERRLA, EOMI CV: S1S2, RRR, no murmur Pulm: cta b/l Ext: left foot s/p 3rd and 5th toe amputations, dressing clean and dry Neuro: no focal deficits.   Labs and Imaging  Recent Labs Lab 12/08/12 0555 12/09/12 0457 12/10/12 0525  WBC 12.9* 12.9* 13.6*  HGB 12.6* 11.4* 10.9*  HCT 36.5* 33.7* 32.8*  PLT 281 267 284     Recent Labs Lab 12/08/12 0555 12/09/12 0457 12/10/12 0525  NA 135 132* 132*  K 3.6 3.5 3.7  CL 97 97 96  CO2 26 26 26   BUN 23 16 15   CREATININE 1.41* 1.03 0.88  GLUCOSE 138* 104* 151*  CALCIUM 9.2 8.5 8.2*     ABI: >1.0 on right, 0.85 on left.   Assessment and Plan 52 y.o. 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 Poor blood flow likely due to combination of microvascular and large vessel disease disease 2o/2 poorly controlled DM and likely atherosclerosis given hx of prior CVA  # left 3rd and 5th toe necrosis: post op day 2 s/p 3rd and 5th toe ray amputation.  - continue pain control - has been afebrile and in NSR for > 24 hrs.   WBC still elevated to 13.6 ( 12.9 yesterday)  On vanc and zosyn. Consider d/cing since afebrile with stable WBC. F/u ortho recs.   # peripheral vascular disease: evaluated by vascular surgery.  - Pt went for aortogram this AM  - Posterior tibial artery occluded on the right.    - F/U further vascular surgery recs, greatly apreciate   # HTN: on metoprolol 50mg  bid. Home irbesartan/HCTZ 300/12.5mg  held. In context of BP's in 110-120's and recent acute renal failure, will continue to hold.   # Acute renal failure: most recent cr: 0.66 in 2012.  Creatinine improving today at 0.88 from 1.03 from 1.41.  - continue holding ARB and HCTZ as BP well controlled   # DM: A1C: 9.0. On levemir 20qhs at home with novlolg 12 tid with meals and glipizide/metformin - on lantus 10u in hospital and requiring 40u novolog in 24hrs. Increase lantus to 12u yesterday.   -CBG have been between 167-278 in past 12 hrs.  Consider increasing Lantus another 2 U in PM  FEN: carb modified PPx: heparin 5000 tid Dispo: pending improvement and vascular eval.   Alijah Akram R. Paulina Fusi, DO of [facility] 12/10/2012, 8:43 AM

## 2012-12-10 NOTE — Progress Notes (Signed)
Family Medicine Teaching Service Attending Note  I interviewed and examined patient Albert [name] and reviewed their tests and x-rays.  I discussed with Dr. Paulina Fusi and reviewed their note for today.  I agree with their assessment and plan.     Additionally  Feels well no signs of infection DC IV antibiotics and watch for signs of recurrent infection

## 2012-12-10 NOTE — Progress Notes (Signed)
Inpatient Diabetes Program Recommendations  AACE/ADA: New Consensus Statement on Inpatient Glycemic Control (2013)  Target Ranges:  Prepandial:   less than 140 mg/dL      Peak postprandial:   less than 180 mg/dL (1-2 hours)      Critically ill patients:  140 - 180 mg/dL   Reason for Visit: Hyperglycemia-- Sub-optimal glycemic control  Inpatient Diabetes Program Recommendations Insulin - Basal: Current Lantus dose is 12 units at HS with possible increase to 14 units.  Home dose of basal insulin is Levemir 20 units every evening.  Recommend Lantus dose be increased to close to home Levemir dosage. Correction (SSI): Currently receiving resistant correction scale every 4 hours.  Received a total of 45 units correction on 2/23 and 43 units correction on 2/22.  Thus far today, patient has received 22 units.  Since patient is taking meals, request correction be changed to tid with meals with HS scale at HS. Insulin - Meal Coverage: Currently no meal coverage ordered.  Received clear liquids (should have gotten about 60 Gm CHO on tray) at lunch.  To receive CHO Modified Medium trays beginning at supper.  Home regimen included Novolog 12 units meal coverage tid with meals. Oral Agents: On Glucovance at home.  Had aortogram today.  May be able to resume at discharge. HgbA1C: Hgb A1C is 9.0 indicating sub-optimal glycemic control with mean glucose of approximately 212 mg/dl. Outpatient Referral: Patient expressing desire to learn how to better care/manage his diabetes.  Is especially interested in meal planning information.  Placed Dietitian consult.  Provided patient handout regarding free Outpatient Classes at [facility].  Patient receptive to attending. Diet: Now on CHO Modified Medium diet.   Note:  Results for CAYCE, QUEZADA (MRN [id number]) as of 12/10/2012 15:05  Ref. Range 12/08/2012 23:51 12/09/2012 04:45 12/09/2012 08:19 12/09/2012 12:12 12/09/2012 17:00 12/09/2012 20:29 12/09/2012 23:55  12/10/2012 04:14 12/10/2012 05:03 12/10/2012 08:39 12/10/2012 11:18  Glucose-Capillary Latest Range: 70-99 mg/dL 657 (H) 846 (H) 962 (H) 265 (H) 303 (H) 278 (H) 259 (H) 179 (H) 167 (H) 151 (H) 229 (H)   Instructed patient is how to access the Patient Education Channel.  Ordered Diabetes Education Booklet.  In summary: Request Lantus insulin be increased to 20 units at HS, or patient be changed to Levemir 20 units at HS as he takes at home Request correction coverage be changed to tid with meals and HS scale at HS Request addition of Novolog meal coverage.  Could start with Novolog 9 units tid and titrate upward as indicated.  Thank you.  Akhila Mahnken S. Elsie Lincoln, RN, CNS, CDE Inpatient Diabetes Program, team pager [phone]

## 2012-12-10 NOTE — Progress Notes (Signed)
VASCULAR PROGRESS NOTE  SUBJECTIVE: Pain adequately control  PHYSICAL EXAM: Filed Vitals:   12/09/12 1321 12/09/12 1800 12/09/12 2025 12/10/12 0411  BP: 114/78 140/78 123/75 121/78  Pulse: 20 90 96 87  Temp: 98.1 F (36.7 C) 99.1 F (37.3 C) 98.6 F (37 C) 98.6 F (37 C)  TempSrc: Oral Oral Oral Oral  Resp: 20 20 20 20   Height:   5\' 11"  (1.803 m)   Weight:   245 lb 11.2 oz (111.449 kg)   SpO2: 98% 98% 96% 97%   Dressing dry left foot  LABS: Lab Results  Component Value Date   WBC 13.6* 12/10/2012   HGB 10.9* 12/10/2012   HCT 32.8* 12/10/2012   MCV 79.2 12/10/2012   PLT 284 12/10/2012   Lab Results  Component Value Date   CREATININE 0.88 12/10/2012   CBG (last 3)   Recent Labs  12/09/12 2355 12/10/12 0414 12/10/12 0503  GLUCAP 259* 179* 167*   ASSESSMENT AND PLAN: 1. For her arteriogram today. Further recommendations pending these results.  Cari Caraway Beeper: [phone] 12/10/2012

## 2012-12-10 NOTE — Progress Notes (Signed)
Utilization review completed.  

## 2012-12-10 NOTE — Progress Notes (Signed)
Patient ID: Albert [name], male   DOB: August 02, 1961, 52 y.o.   MRN: [id number] Mr. Asebedo did have an intervention by the vascular surgeons today to try to improve blood flow to his left foot.  On exam of his forefoot today, the lateral aspect of his 2nd toe has a necrotic wound that I'm concerned will not heal at all.  The area over his 4th and 5th rays looks much better.  I place xeroform over the incision and the wound at his 2nd ray.  I'll decide over the next day whether or not to proceed with a repeat I&D and a 2nd ray resection

## 2012-12-10 NOTE — Op Note (Signed)
PATIENT: Albert [name]  MRN: [id number] DOB: [birth year]    DATE OF PROCEDURE: 12/10/2012  INDICATIONS: [name] is a 52 y.o. male nonhealing wound of left foot. He is diabetic. He has evidence of infrainguinal arterial occlusive disease on exam.  PROCEDURE:  1. Ultrasound-guided access to the right common femoral artery 2. Aortogram with bilateral iliac arteriogram 3. Selective catheterization of the left external iliac artery with left lower extremity runoff 4. Retrograde right femoral arteriogram with right lower extremity runoff  SURGEON: Di Kindle. Edilia Bo, MD, FACS  ANESTHESIA: local with sedation   EBL: minimal  TECHNIQUE: The patient was brought to the peripheral vascular lab and sedated with the milligram of Versed and 50 mcg of fentanyl. Both groins were prepped and draped in the usual sterile fashion. Under ultrasound guidance, and after the skin was anesthetized, the right common femoral artery was cannulated a guidewire introduced into the infrarenal aorta under fluoroscopic control. A 5 French sheath was placed over the wire. A pigtail catheter was positioned at the L1 vertebral body and flush aortogram obtained. The catheter was positioned above the aortic bifurcation and oblique iliac projections were obtained. The pigtail catheter was then exchanged for a crossover catheter which was positioned into the left common iliac artery. The wire was advanced into the external iliac artery, and the crossover catheter was exchanged for an endhole catheter. Selective left external iliac arteriogram was obtained with left lower extremity runoff. This catheter was then removed area the right lower extremity films were obtained to the right femoral sheath.  FINDINGS:  1. There are single renal arteries bilaterally with no significant renal artery stenosis identified. The infrarenal aorta is widely patent as are both common iliac arteries and external iliac arteries and hypogastric  arteries. There is an ulcerated plaque in the proximal left common iliac artery which does not produce a significant stenosis. 2. On the left side, the common femoral artery deep femoral artery are patent. There is some mild disease in the proximal superficial femoral artery which does not appear to be flow limiting. The superficial femoral artery, popliteal artery are patent on the left. There is occlusion of the proximal anterior tibial and tibial peroneal trunk on the left. There was reconstitution of the peroneal artery on the left in the distal posterior tibial artery. The anterior tibial artery is occluded. 3. Upper the right side the common femoral and deep femoral artery a patent. The superficial femoral artery is patent. There is a 30% focal stenosis of the distal superficial femoral artery on the right. On the right there is two-vessel runoff via the anterior tibial and peroneal arteries. There is moderate diffuse disease proximally both vessels. The posterior tibial artery is occluded.  [name], MD, FACS [facility]  DATE OF DICTATION:   12/10/2012

## 2012-12-10 NOTE — Evaluation (Addendum)
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  Pt would benefit acute PT services in order to improve independence with mobility while maintaining precautions during hospitalization to prepare for d/c home with spouse.  Pt reports more vascular surgery possible during stay as well.    PT Assessment  Patient needs continued PT services    Follow Up Recommendations  No PT follow up    Does the patient have the potential to tolerate intense rehabilitation      Barriers to Discharge        Equipment Recommendations  Cane;Other (comment) (if unable to acquire)    Recommendations for Other Services     Frequency Min 4X/week    Precautions / Restrictions Precautions Precaution Comments: no order for darco shoe but in room Restrictions Weight Bearing Restrictions: Yes LLE Weight Bearing: Partial weight bearing Other Position/Activity Restrictions: able to put weight through heel   Pertinent Vitals/Pain RN medicated pt by IV prior to session.  RN stated no further activity restrictions since procedure this morning and okay for PT eval.      Mobility  Bed Mobility Bed Mobility: Supine to Sit;Sit to Supine Supine to Sit: 6: Modified independent (Device/Increase time) Sit to Supine: 6: Modified independent (Device/Increase time) Transfers Transfers: Sit to Stand;Stand to Sit Sit to Stand: 4: Min guard;From bed;With upper extremity assist Stand to Sit: 4: Min guard;To bed;With upper extremity assist Details for Transfer Assistance: min/guard for safety, educated for [facility] and weight through heel with ambulation Ambulation/Gait Ambulation Distance (Feet): 80 Feet Assistive device: None Ambulation/Gait Assistance Details: pt pushed IV pole, constant  cues for heel WBing only and IV pole for support, declined RW, RN reported darco shoe in room upon returning to bed (found at bottom of closet and placed within sight for future mobility)  pt aware to wear shoe to assist with WBing and gait Gait Pattern: Step-to pattern;Antalgic;Decreased stance time - left Gait velocity: decreased    Exercises     PT Diagnosis: Difficulty walking;Acute pain  PT Problem List: Decreased activity tolerance;Decreased mobility;Decreased knowledge of precautions;Decreased knowledge of use of DME;Pain PT Treatment Interventions: DME instruction;Gait training;Functional mobility training;Therapeutic activities;Therapeutic exercise;Patient/family education   PT Goals Acute Rehab PT Goals PT Goal Formulation: With patient Time For Goal Achievement: 12/17/12 Potential to Achieve Goals: Good Pt will go Sit to Stand: with modified independence PT Goal: Sit to Stand - Progress: Goal set today Pt will go Stand to Sit: with modified independence PT Goal: Stand to Sit - Progress: Goal set today Pt will Ambulate: >150 feet;with modified independence;with least restrictive assistive device PT Goal: Ambulate - Progress: Goal set today Pt will Perform Home Exercise Program: Independently PT Goal: Perform Home Exercise Program - Progress: Goal set today  Visit Information  Last PT Received On: 12/10/12 Assistance Needed: +1    Subjective Data  Subjective: My foot is hurting. (IV meds given prior to session) Patient Stated Goal: home   Prior Functioning  Home Living Lives With: Spouse Type of Home: House Home Access: Level entry Home Layout: Two level Alternate Level Stairs-Number of Steps: 1 step between living areas Alternate Level Stairs-Rails: None Home Adaptive Equipment: None Additional Comments: Pt reports he cane acquire SPC. Prior Function Level of Independence: Independent Communication Communication: No difficulties  Cognition   Cognition Overall Cognitive Status: Appears within functional limits for tasks assessed/performed Arousal/Alertness: Awake/alert Orientation Level: Appears intact for tasks assessed Behavior During Session: [facility] for tasks performed    Extremity/Trunk Assessment Right Upper Extremity Assessment RUE ROM/Strength/Tone: Deficits RUE ROM/Strength/Tone Deficits: R hand deficits since CVA Left Upper Extremity Assessment LUE ROM/Strength/Tone: WFL for tasks assessed Right Lower Extremity Assessment RLE ROM/Strength/Tone: WFL for tasks assessed RLE Sensation: Deficits RLE Sensation Deficits: hx of PVD and diabetes Left Lower Extremity Assessment LLE ROM/Strength/Tone: WFL for tasks assessed;Deficits;Due to pain LLE ROM/Strength/Tone Deficits: ankle NT LLE Sensation: Deficits LLE Sensation Deficits: hx of PVD and diabetes   Balance    End of Session PT - End of Session Activity Tolerance: Patient limited by pain Patient left: in bed;with call bell/phone within reach Nurse Communication: Mobility status;Weight bearing status  GP     Janesia Joswick,KATHrine E 12/10/2012, 4:39 PM  Zenovia Jarred, PT, DPT 12/10/2012 Pager: [phone]

## 2012-12-11 ENCOUNTER — Inpatient Hospital Stay (HOSPITAL_COMMUNITY): Payer: 59 | Admitting: Certified Registered Nurse Anesthetist

## 2012-12-11 ENCOUNTER — Encounter (HOSPITAL_COMMUNITY)
Admission: EM | Disposition: A | Discharge status: Home / Self Care | Payer: Self-pay | Source: Home / Self Care | Attending: Family Medicine

## 2012-12-11 ENCOUNTER — Encounter (HOSPITAL_COMMUNITY): Payer: Self-pay | Admitting: Certified Registered Nurse Anesthetist

## 2012-12-11 DIAGNOSIS — Z01810 Encounter for preprocedural cardiovascular examination: Secondary | ICD-10-CM

## 2012-12-11 HISTORY — PX: AMPUTATION: SHX166

## 2012-12-11 HISTORY — PX: I & D EXTREMITY: SHX5045

## 2012-12-11 LAB — BASIC METABOLIC PANEL
BUN: 14 mg/dL (ref 6–23)
Calcium: 8.3 mg/dL — ABNORMAL LOW (ref 8.4–10.5)
Chloride: 97 mEq/L (ref 96–112)
Creatinine, Ser: 0.93 mg/dL (ref 0.50–1.35)
GFR calc Af Amer: >90 mL/min (ref >90)
GFR calc non Af Amer: >90 mL/min (ref >90)

## 2012-12-11 LAB — CBC WITH DIFFERENTIAL/PLATELET
Basophils Absolute: 0.1 10*3/uL (ref 0.0–0.1)
Eosinophils Absolute: 0.1 10*3/uL (ref 0.0–0.7)
HCT: 33.2 % — ABNORMAL LOW (ref 39.0–52.0)
Lymphocytes Relative: 17 % (ref 12–46)
MCHC: 33.7 g/dL (ref 30.0–36.0)
Monocytes Relative: 9 % (ref 3–12)
Neutro Abs: 9.5 10*3/uL — ABNORMAL HIGH (ref 1.7–7.7)
Neutrophils Relative %: 72 % (ref 43–77)
Platelets: 326 10*3/uL (ref 150–400)
RDW: 13.2 % (ref 11.5–15.5)
WBC: 13.1 10*3/uL — ABNORMAL HIGH (ref 4.0–10.5)

## 2012-12-11 LAB — GLUCOSE, CAPILLARY
Glucose-Capillary: 228 mg/dL — ABNORMAL HIGH (ref 70–99)
Glucose-Capillary: 246 mg/dL — ABNORMAL HIGH (ref 70–99)

## 2012-12-11 SURGERY — IRRIGATION AND DEBRIDEMENT EXTREMITY
Anesthesia: General | Site: Foot | Laterality: Left | Wound class: Dirty or Infected

## 2012-12-11 MED ORDER — SODIUM CHLORIDE 0.9 % IR SOLN
Status: DC | PRN
  Administered 2012-12-11: 3000 mL

## 2012-12-11 MED ORDER — MIDAZOLAM HCL 5 MG/5ML IJ SOLN
INTRAMUSCULAR | Status: DC | PRN
  Administered 2012-12-11: 2 mg via INTRAVENOUS

## 2012-12-11 MED ORDER — LIDOCAINE HCL 4 % MT SOLN
OROMUCOSAL | Status: DC | PRN
  Administered 2012-12-11: 4 mL via TOPICAL

## 2012-12-11 MED ORDER — HYDROMORPHONE HCL PF 1 MG/ML IJ SOLN: 0.2500 mg | INTRAMUSCULAR | Status: DC | PRN

## 2012-12-11 MED ORDER — LACTATED RINGERS IV SOLN
INTRAVENOUS | Status: DC | PRN
  Administered 2012-12-11: 19:00:00 via INTRAVENOUS

## 2012-12-11 MED ORDER — INSULIN GLARGINE 100 UNIT/ML ~~LOC~~ SOLN
20.0000 [IU] | Freq: Every day | SUBCUTANEOUS | Status: DC
  Administered 2012-12-12: 03:00:00 via SUBCUTANEOUS

## 2012-12-11 MED ORDER — NEOSTIGMINE METHYLSULFATE 1 MG/ML IJ SOLN
INTRAMUSCULAR | Status: DC | PRN
  Administered 2012-12-11: 3 mg via INTRAVENOUS

## 2012-12-11 MED ORDER — INSULIN ASPART 100 UNIT/ML ~~LOC~~ SOLN
0.0000 [IU] | Freq: Three times a day (TID) | SUBCUTANEOUS | Status: DC
  Administered 2012-12-11: 7 [IU] via SUBCUTANEOUS
  Administered 2012-12-12: 08:00:00 via SUBCUTANEOUS
  Administered 2012-12-12: 7 [IU] via SUBCUTANEOUS
  Administered 2012-12-12: 11 [IU] via SUBCUTANEOUS
  Administered 2012-12-13 – 2012-12-14 (×2): 7 [IU] via SUBCUTANEOUS

## 2012-12-11 MED ORDER — GLYCOPYRROLATE 0.2 MG/ML IJ SOLN
INTRAMUSCULAR | Status: DC | PRN
  Administered 2012-12-11: 0.4 mg via INTRAVENOUS

## 2012-12-11 MED ORDER — OXYCODONE HCL 5 MG/5ML PO SOLN: 5.0000 mg | Freq: Once | ORAL | Status: AC | PRN

## 2012-12-11 MED ORDER — HYDROCHLOROTHIAZIDE 12.5 MG PO CAPS
12.5000 mg | ORAL_CAPSULE | Freq: Every day | ORAL | Status: DC
  Administered 2012-12-11: 12.5 mg via ORAL
  Filled 2012-12-11 (×2): qty 1

## 2012-12-11 MED ORDER — INSULIN GLARGINE 100 UNIT/ML ~~LOC~~ SOLN: 20.0000 [IU] | Freq: Every day | SUBCUTANEOUS | Status: DC

## 2012-12-11 MED ORDER — ROCURONIUM BROMIDE 100 MG/10ML IV SOLN
INTRAVENOUS | Status: DC | PRN
  Administered 2012-12-11: 40 mg via INTRAVENOUS

## 2012-12-11 MED ORDER — LIDOCAINE HCL (CARDIAC) 20 MG/ML IV SOLN
INTRAVENOUS | Status: DC | PRN
  Administered 2012-12-11: 100 mg via INTRAVENOUS

## 2012-12-11 MED ORDER — PROPOFOL 10 MG/ML IV BOLUS
INTRAVENOUS | Status: DC | PRN
  Administered 2012-12-11: 200 mg via INTRAVENOUS

## 2012-12-11 MED ORDER — IRBESARTAN-HYDROCHLOROTHIAZIDE 300-12.5 MG PO TABS: 1.0000 | ORAL_TABLET | Freq: Every day | ORAL | Status: DC

## 2012-12-11 MED ORDER — ONDANSETRON HCL 4 MG/2ML IJ SOLN
INTRAMUSCULAR | Status: DC | PRN
  Administered 2012-12-11: 4 mg via INTRAVENOUS

## 2012-12-11 MED ORDER — FENTANYL CITRATE 0.05 MG/ML IJ SOLN
INTRAMUSCULAR | Status: DC | PRN
  Administered 2012-12-11: 100 ug via INTRAVENOUS

## 2012-12-11 MED ORDER — IRBESARTAN 300 MG PO TABS
300.0000 mg | ORAL_TABLET | Freq: Every day | ORAL | Status: DC
  Administered 2012-12-11: 300 mg via ORAL
  Filled 2012-12-11 (×2): qty 1

## 2012-12-11 MED ORDER — METOCLOPRAMIDE HCL 5 MG/ML IJ SOLN
10.0000 mg | Freq: Once | INTRAMUSCULAR | Status: AC | PRN
  Filled 2012-12-11: qty 2

## 2012-12-11 MED ORDER — OXYCODONE HCL 5 MG PO TABS: 5.0000 mg | ORAL_TABLET | Freq: Once | ORAL | Status: AC | PRN

## 2012-12-11 SURGICAL SUPPLY — 64 items
BANDAGE CONFORM 3  STR LF (GAUZE/BANDAGES/DRESSINGS) IMPLANT
BANDAGE ELASTIC 3 VELCRO ST LF (GAUZE/BANDAGES/DRESSINGS) IMPLANT
BANDAGE ESMARK 6X9 LF (GAUZE/BANDAGES/DRESSINGS) IMPLANT
BANDAGE GAUZE ELAST BULKY 4 IN (GAUZE/BANDAGES/DRESSINGS) ×2 IMPLANT
BLADE SURG 10 STRL SS (BLADE) IMPLANT
BNDG COHESIVE 1X5 TAN STRL LF (GAUZE/BANDAGES/DRESSINGS) IMPLANT
BNDG COHESIVE 4X5 TAN STRL (GAUZE/BANDAGES/DRESSINGS) IMPLANT
BNDG COHESIVE 6X5 TAN STRL LF (GAUZE/BANDAGES/DRESSINGS) IMPLANT
BNDG ESMARK 6X9 LF (GAUZE/BANDAGES/DRESSINGS)
BNDG GAUZE STRTCH 6 (GAUZE/BANDAGES/DRESSINGS) ×6 IMPLANT
CLOTH BEACON ORANGE TIMEOUT ST (SAFETY) ×2 IMPLANT
CORDS BIPOLAR (ELECTRODE) IMPLANT
COVER SURGICAL LIGHT HANDLE (MISCELLANEOUS) ×2 IMPLANT
CUFF TOURNIQUET SINGLE 18IN (TOURNIQUET CUFF) IMPLANT
CUFF TOURNIQUET SINGLE 24IN (TOURNIQUET CUFF) IMPLANT
CUFF TOURNIQUET SINGLE 34IN LL (TOURNIQUET CUFF) IMPLANT
CUFF TOURNIQUET SINGLE 44IN (TOURNIQUET CUFF) IMPLANT
DRAPE ORTHO SPLIT 77X108 STRL (DRAPES)
DRAPE SURG 17X23 STRL (DRAPES) IMPLANT
DRAPE SURG ORHT 6 SPLT 77X108 (DRAPES) IMPLANT
DRAPE U-SHAPE 47X51 STRL (DRAPES) ×2 IMPLANT
DURAPREP 26ML APPLICATOR (WOUND CARE) ×2 IMPLANT
ELECT CAUTERY BLADE 6.4 (BLADE) IMPLANT
ELECT REM PT RETURN 9FT ADLT (ELECTROSURGICAL) ×2
ELECTRODE REM PT RTRN 9FT ADLT (ELECTROSURGICAL) ×1 IMPLANT
GAUZE XEROFORM 1X8 LF (GAUZE/BANDAGES/DRESSINGS) ×2 IMPLANT
GAUZE XEROFORM 5X9 LF (GAUZE/BANDAGES/DRESSINGS) IMPLANT
GLOVE BIO SURGEON STRL SZ7.5 (GLOVE) ×2 IMPLANT
GLOVE BIOGEL PI IND STRL 8 (GLOVE) ×2 IMPLANT
GLOVE BIOGEL PI INDICATOR 8 (GLOVE) ×2
GLOVE ORTHO TXT STRL SZ7.5 (GLOVE) ×2 IMPLANT
GOWN PREVENTION PLUS LG XLONG (DISPOSABLE) IMPLANT
GOWN PREVENTION PLUS XLARGE (GOWN DISPOSABLE) ×4 IMPLANT
GOWN STRL NON-REIN LRG LVL3 (GOWN DISPOSABLE) ×2 IMPLANT
HANDPIECE INTERPULSE COAX TIP (DISPOSABLE) ×1
KIT BASIN OR (CUSTOM PROCEDURE TRAY) ×2 IMPLANT
KIT ROOM TURNOVER OR (KITS) ×2 IMPLANT
MANIFOLD NEPTUNE II (INSTRUMENTS) ×2 IMPLANT
NS IRRIG 1000ML POUR BTL (IV SOLUTION) ×2 IMPLANT
PACK ORTHO EXTREMITY (CUSTOM PROCEDURE TRAY) ×2 IMPLANT
PAD ARMBOARD 7.5X6 YLW CONV (MISCELLANEOUS) ×4 IMPLANT
PAD CAST 4YDX4 CTTN HI CHSV (CAST SUPPLIES) IMPLANT
PADDING CAST ABS 4INX4YD NS (CAST SUPPLIES)
PADDING CAST ABS COTTON 4X4 ST (CAST SUPPLIES) IMPLANT
PADDING CAST COTTON 4X4 STRL (CAST SUPPLIES)
PADDING CAST COTTON 6X4 STRL (CAST SUPPLIES) IMPLANT
SET HNDPC FAN SPRY TIP SCT (DISPOSABLE) ×1 IMPLANT
SPONGE GAUZE 4X4 12PLY (GAUZE/BANDAGES/DRESSINGS) ×2 IMPLANT
SPONGE LAP 18X18 X RAY DECT (DISPOSABLE) ×2 IMPLANT
STAPLER VISISTAT 35W (STAPLE) IMPLANT
STOCKINETTE IMPERVIOUS 9X36 MD (GAUZE/BANDAGES/DRESSINGS) IMPLANT
STOCKINETTE IMPERVIOUS LG (DRAPES) IMPLANT
SUT ETHILON 2 0 FS 18 (SUTURE) IMPLANT
SUT ETHILON 2 0 PSLX (SUTURE) ×4 IMPLANT
SUT ETHILON 3 0 PS 1 (SUTURE) IMPLANT
SYR CONTROL 10ML LL (SYRINGE) IMPLANT
TOWEL OR 17X24 6PK STRL BLUE (TOWEL DISPOSABLE) ×2 IMPLANT
TOWEL OR 17X26 10 PK STRL BLUE (TOWEL DISPOSABLE) ×2 IMPLANT
TUBE ANAEROBIC SPECIMEN COL (MISCELLANEOUS) IMPLANT
TUBE CONNECTING 12X1/4 (SUCTIONS) ×2 IMPLANT
TUBE FEEDING 5FR 15 INCH (TUBING) IMPLANT
UNDERPAD 30X30 INCONTINENT (UNDERPADS AND DIAPERS) ×2 IMPLANT
WATER STERILE IRR 1000ML POUR (IV SOLUTION) IMPLANT
YANKAUER SUCT BULB TIP NO VENT (SUCTIONS) ×2 IMPLANT

## 2012-12-11 NOTE — Plan of Care (Signed)
Problem: Food- and Nutrition-Related Knowledge Deficit (NB-1.1) Goal: Nutrition education Formal process to instruct or train a patient/client in a skill or to impart knowledge to help patients/clients voluntarily manage or modify food choices and eating behavior to maintain or improve health. Outcome: Completed/Met Date Met:  12/11/12  RD consulted for nutrition education regarding diabetes.     Lab Results  Component Value Date    HGBA1C 9.0* 12/08/2012    RD provided "Carbohydrate Counting for People with Diabetes" handout from the Academy of [employer] Dietetics. Discussed different food groups and their effects on blood sugar, emphasizing carbohydrate-containing foods. Provided list of carbohydrates and recommended serving sizes of common foods.  Discussed importance of controlled and consistent carbohydrate intake throughout the day. Provided examples of ways to balance meals/snacks and encouraged intake of high-fiber, whole grain complex carbohydrates. Teach back method used.  Expect fair compliance.  Body mass index is 34.28 kg/(m^2). Pt meets criteria for Obese Class I based on current BMI.  Current diet order is Carbohydrate Modified Medium, patient is consuming approximately 100% of meals at this time. Labs and medications reviewed. No further nutrition interventions warranted at this time. RD contact information provided. If additional nutrition issues arise, please re-consult RD.  Jarold Motto MS, RD, LDN Pager: [phone] After-hours pager: [phone]

## 2012-12-11 NOTE — Consult Note (Signed)
Reason for Consult: Preoperative evaluation Referring Physician:  Primary cardiologist: None PCP: Dr. Maple Mirza KORDE JEPPSEN is an 52 y.o. male.  HPI: This 52 year old gentleman was admitted because of a nonhealing wound of his right foot.  He has a history of diabetes mellitus, hypertension, and hyperlipidemia.  He also had a stroke with residual mobility deficit of the right hand.  His stroke was in 2011 at the time that he had uncontrolled hypertension.  He has had poorly controlled diabetes mellitus with hemoglobin A1c of 9.  He does not have any history of congestive heart failure.  He denies any history of exertional chest pain or angina pectoris or palpitations.  No history of tachycardia dizziness or syncope.  Past Medical History  Diagnosis Date  . Hypertension   . Diabetes mellitus without complication ~2000    last HbA1c ~9  . Hypercholesteremia   . CVA (cerebral infarction) 2011    R hand deficit  . Chronic sinusitis   . Allergic rhinitis   . Chronic cough     Past Surgical History  Procedure Laterality Date  . 4th toe amputation    . Tonsillectomy and adenoidectomy    . Penile prosthesis implant    . I&d extremity Left 12/08/2012    Procedure: IRRIGATION AND DEBRIDEMENT EXTREMITY;  Surgeon: Kathryne Hitch, MD;  Location: [facility] OR;  Service: Orthopedics;  Laterality: Left;  . Amputation Left 12/08/2012    Procedure: AMPUTATION DIGIT;  Surgeon: Kathryne Hitch, MD;  Location: [facility] OR;  Service: Orthopedics;  Laterality: Left;  3rd toe amputation possible 5th toe amputation    Family History  Problem Relation Age of Onset  . Diabetes    . Pancreatic cancer Mother   . Breast cancer Sister     Social History:  reports that he has never smoked. He does not have any smokeless tobacco history on file. He reports that he does not drink alcohol or use illicit drugs.  Allergies:  Allergies  Allergen Reactions  . Amoxicillin     vomiting    Medications:    Scheduled: . aspirin EC  81 mg Oral Daily  . enoxaparin  40 mg Subcutaneous Q24H  . irbesartan  300 mg Oral Daily   And  . hydrochlorothiazide  12.5 mg Oral Daily  . insulin aspart  0-20 Units Subcutaneous TID WC  . insulin glargine  20 Units Subcutaneous QHS  . metoprolol tartrate  50 mg Oral BID  . ondansetron  4 mg Intravenous Once  . pantoprazole  40 mg Oral Daily  . simvastatin  40 mg Oral QPM  . sodium chloride  2 spray Each Nare BID    Results for orders placed during the hospital encounter of 12/07/12 (from the past 48 hour(s))  GLUCOSE, CAPILLARY     Status: Abnormal   Collection Time    12/09/12  5:00 PM      Result Value Range   Glucose-Capillary 303 (*) 70 - 99 mg/dL   Comment 1 Notify RN     Comment 2 Documented in Chart    GLUCOSE, CAPILLARY     Status: Abnormal   Collection Time    12/09/12  8:29 PM      Result Value Range   Glucose-Capillary 278 (*) 70 - 99 mg/dL  GLUCOSE, CAPILLARY     Status: Abnormal   Collection Time    12/09/12 11:55 PM      Result Value Range   Glucose-Capillary 259 (*) 70 -  99 mg/dL  GLUCOSE, CAPILLARY     Status: Abnormal   Collection Time    12/10/12  4:14 AM      Result Value Range   Glucose-Capillary 179 (*) 70 - 99 mg/dL  GLUCOSE, CAPILLARY     Status: Abnormal   Collection Time    12/10/12  5:03 AM      Result Value Range   Glucose-Capillary 167 (*) 70 - 99 mg/dL  CBC     Status: Abnormal   Collection Time    12/10/12  5:25 AM      Result Value Range   WBC 13.6 (*) 4.0 - 10.5 K/uL   RBC 4.14 (*) 4.22 - 5.81 MIL/uL   Hemoglobin 10.9 (*) 13.0 - 17.0 g/dL   HCT 16.1 (*) 09.6 - 04.5 %   MCV 79.2  78.0 - 100.0 fL   MCH 26.3  26.0 - 34.0 pg   MCHC 33.2  30.0 - 36.0 g/dL   RDW 40.9  81.1 - 91.4 %   Platelets 284  150 - 400 K/uL  BASIC METABOLIC PANEL     Status: Abnormal   Collection Time    12/10/12  5:25 AM      Result Value Range   Sodium 132 (*) 135 - 145 mEq/L   Potassium 3.7  3.5 - 5.1 mEq/L   Chloride 96  96  - 112 mEq/L   CO2 26  19 - 32 mEq/L   Glucose, Bld 151 (*) 70 - 99 mg/dL   BUN 15  6 - 23 mg/dL   Creatinine, Ser 7.82  0.50 - 1.35 mg/dL   Calcium 8.2 (*) 8.4 - 10.5 mg/dL   GFR calc non Af Amer >90  >90 mL/min   GFR calc Af Amer >90  >90 mL/min   Comment:            The eGFR has been calculated     using the CKD EPI equation.     This calculation has not been     validated in all clinical     situations.     eGFR's persistently     <90 mL/min signify     possible Chronic Kidney Disease.  GLUCOSE, CAPILLARY     Status: Abnormal   Collection Time    12/10/12  8:39 AM      Result Value Range   Glucose-Capillary 151 (*) 70 - 99 mg/dL  GLUCOSE, CAPILLARY     Status: Abnormal   Collection Time    12/10/12 11:18 AM      Result Value Range   Glucose-Capillary 229 (*) 70 - 99 mg/dL   Comment 1 Documented in Chart     Comment 2 Notify RN    GLUCOSE, CAPILLARY     Status: Abnormal   Collection Time    12/10/12  4:47 PM      Result Value Range   Glucose-Capillary 214 (*) 70 - 99 mg/dL   Comment 1 Documented in Chart     Comment 2 Notify RN    GLUCOSE, CAPILLARY     Status: Abnormal   Collection Time    12/10/12  8:05 PM      Result Value Range   Glucose-Capillary 216 (*) 70 - 99 mg/dL  GLUCOSE, CAPILLARY     Status: Abnormal   Collection Time    12/10/12 11:48 PM      Result Value Range   Glucose-Capillary 218 (*) 70 - 99 mg/dL  GLUCOSE, CAPILLARY  Status: Abnormal   Collection Time    12/11/12  4:00 AM      Result Value Range   Glucose-Capillary 180 (*) 70 - 99 mg/dL  BASIC METABOLIC PANEL     Status: Abnormal   Collection Time    12/11/12  6:15 AM      Result Value Range   Sodium 131 (*) 135 - 145 mEq/L   Potassium 4.6  3.5 - 5.1 mEq/L   Comment: DELTA CHECK NOTED   Chloride 97  96 - 112 mEq/L   CO2 24  19 - 32 mEq/L   Glucose, Bld 216 (*) 70 - 99 mg/dL   BUN 14  6 - 23 mg/dL   Creatinine, Ser 1.61  0.50 - 1.35 mg/dL   Calcium 8.3 (*) 8.4 - 10.5 mg/dL   GFR  calc non Af Amer >90  >90 mL/min   GFR calc Af Amer >90  >90 mL/min   Comment:            The eGFR has been calculated     using the CKD EPI equation.     This calculation has not been     validated in all clinical     situations.     eGFR's persistently     <90 mL/min signify     possible Chronic Kidney Disease.  CBC WITH DIFFERENTIAL     Status: Abnormal   Collection Time    12/11/12  6:15 AM      Result Value Range   WBC 13.1 (*) 4.0 - 10.5 K/uL   RBC 4.21 (*) 4.22 - 5.81 MIL/uL   Hemoglobin 11.2 (*) 13.0 - 17.0 g/dL   HCT 09.6 (*) 04.5 - 40.9 %   MCV 78.9  78.0 - 100.0 fL   MCH 26.6  26.0 - 34.0 pg   MCHC 33.7  30.0 - 36.0 g/dL   RDW 81.1  91.4 - 78.2 %   Platelets 326  150 - 400 K/uL   Neutrophils Relative 72  43 - 77 %   Lymphocytes Relative 17  12 - 46 %   Monocytes Relative 9  3 - 12 %   Eosinophils Relative 1  0 - 5 %   Basophils Relative 1  0 - 1 %   Neutro Abs 9.5 (*) 1.7 - 7.7 K/uL   Lymphs Abs 2.2  0.7 - 4.0 K/uL   Monocytes Absolute 1.2 (*) 0.1 - 1.0 K/uL   Eosinophils Absolute 0.1  0.0 - 0.7 K/uL   Basophils Absolute 0.1  0.0 - 0.1 K/uL   RBC Morphology POLYCHROMASIA PRESENT     WBC Morphology MILD LEFT SHIFT (1-5% METAS, OCC MYELO, OCC BANDS)     Comment: ATYPICAL LYMPHOCYTES  GLUCOSE, CAPILLARY     Status: Abnormal   Collection Time    12/11/12  7:31 AM      Result Value Range   Glucose-Capillary 194 (*) 70 - 99 mg/dL  GLUCOSE, CAPILLARY     Status: Abnormal   Collection Time    12/11/12 12:19 PM      Result Value Range   Glucose-Capillary 228 (*) 70 - 99 mg/dL    No results found.  Review of systems negative except as noted in present illness Blood pressure 157/82, pulse 91, temperature 98.6 F (37 C), temperature source Oral, resp. rate 18, height 5\' 11"  (1.803 m), weight 245 lb 11.2 oz (111.449 kg), SpO2 97.00%.   Subjective:    Objective:  Vital Signs  in the last 24 hours: Temp:  [98.1 F (36.7 C)-99.3 F (37.4 C)] 98.6 F (37 C)  (02/25 1309) Pulse Rate:  [91-100] 91 (02/25 1309) Resp:  [17-18] 18 (02/25 1309) BP: (136-160)/(69-97) 157/82 mmHg (02/25 1309) SpO2:  [95 %-97 %] 97 % (02/25 1309) Weight:  [245 lb 11.2 oz (111.449 kg)] 245 lb 11.2 oz (111.449 kg) (02/24 2002)  Intake/Output from previous day: 02/24 0701 - 02/25 0700 In: 2021.4 [P.O.:1062; I.V.:759.4; IV Piggyback:200] Out: 1100 [Urine:1100] Intake/Output from this shift: Total I/O In: 360 [P.O.:360] Out: 700 [Urine:700]  . aspirin EC  81 mg Oral Daily  . enoxaparin  40 mg Subcutaneous Q24H  . irbesartan  300 mg Oral Daily   And  . hydrochlorothiazide  12.5 mg Oral Daily  . insulin aspart  0-20 Units Subcutaneous TID WC  . insulin glargine  20 Units Subcutaneous QHS  . metoprolol tartrate  50 mg Oral BID  . ondansetron  4 mg Intravenous Once  . pantoprazole  40 mg Oral Daily  . simvastatin  40 mg Oral QPM  . sodium chloride  2 spray Each Nare BID   . sodium chloride 10 mL/hr at 12/09/12 2216    Physical Exam: The patient appears to be in no distress.  Head and neck exam reveals that the pupils are equal and reactive.  The extraocular movements are full.  There is no scleral icterus.  Mouth and pharynx are benign.  No lymphadenopathy.  No carotid bruits.  The jugular venous pressure is normal.  Thyroid is not enlarged or tender.  Chest is clear to percussion and auscultation.  No rales or rhonchi.  Expansion of the chest is symmetrical.  Heart reveals no abnormal lift or heave.  First and second heart sounds are normal.  There is no murmur gallop rub or click. Extremities: Dressing in place on left foot   Lab Results:  Recent Labs  12/10/12 0525 12/11/12 0615  WBC 13.6* 13.1*  HGB 10.9* 11.2*  PLT 284 326    Recent Labs  12/10/12 0525 12/11/12 0615  NA 132* 131*  K 3.7 4.6  CL 96 97  CO2 26 24  GLUCOSE 151* 216*  BUN 15 14  CREATININE 0.88 0.93   No results found for this basename: TROPONINI, CK, MB,  in the last 72  hours Hepatic Function Panel No results found for this basename: PROT, ALBUMIN, AST, ALT, ALKPHOS, BILITOT, BILIDIR, IBILI,  in the last 72 hours No results found for this basename: CHOL,  in the last 72 hours No results found for this basename: PROTIME,  in the last 72 hours  Imaging: Imaging results have been reviewed.  No recent chest x-ray in Epic.   Cardiac Studies: EKG on 12/08/12 shows sinus tachycardia and no ischemic changes Assessment/Plan:  1.  Hypertensive cardiovascular disease 2.  diabetes mellitus,  poorly controlled  3. severe peripheral vascular disease with nonhealing foot wound  Recommendation: At this point the patient appears to be a reasonable candidate for proposed vascular surgery.  He does have multiple risk factors for ischemic heart disease but no history of angina or congestive heart failure.  Will obtain two-dimensional echocardiogram to look at LV function.  We'll update chest x-ray and EKG.  Will follow with you.  LOS: 4 days    Cassell Clement 12/11/2012, 4:34 PM,

## 2012-12-11 NOTE — Brief Op Note (Signed)
12/07/2012 - 12/11/2012  7:48 PM  PATIENT:  Albert [name]  52 y.o. male  PRE-OPERATIVE DIAGNOSIS:  infected left foot  POST-OPERATIVE DIAGNOSIS:  infected left foot  PROCEDURE:  Procedure(s): REPEAT I&D LEFT FOOT (Left) Amputation of 2nd Toe (Left)  SURGEON:  Surgeon(s) and Role:    * Kathryne Hitch, MD - Primary  PHYSICIAN ASSISTANT:   ASSISTANTS: none   ANESTHESIA:   general  EBL:  Total I/O In: 1000 [I.V.:1000] Out: -   BLOOD ADMINISTERED:none  DRAINS: none   LOCAL MEDICATIONS USED:  NONE  SPECIMEN:  No Specimen  DISPOSITION OF SPECIMEN:  [name]  COUNTS:  YES  TOURNIQUET:  * No tourniquets in log *  DICTATION: .Other Dictation: Dictation Number [id number]  PLAN OF CARE: Admit to inpatient   PATIENT DISPOSITION:  PACU - hemodynamically stable.   Delay start of Pharmacological VTE agent (>24hrs) due to surgical blood loss or risk of bleeding: no

## 2012-12-11 NOTE — Progress Notes (Signed)
Dr. Pollie Meyer notified of pt frequent, dry hacking cough. Pt keeps clearing his throat and coughing loudly and deeply. Pt VS normal and pt does not complain with cough. Pt did have a coughing episode after lunch where he stated "something went down the wrong way" and spit up mucous afterward. Pt stable at this time. Will continue to monitor. Albert [name], Albert [name] Randy

## 2012-12-11 NOTE — Progress Notes (Signed)
Physical Therapy Treatment Patient Details Name: Albert [name] MRN: [id number] DOB: 06-Jan-1961 Today's Date: 12/11/2012 Time: 4098-1191 PT Time Calculation (min): 27 min  PT Assessment / Plan / Recommendation Comments on Treatment Session  Patient tolerating ambulation, but needs walker for safety especially if habving further amputation this pm.  Also feel WILL need follow up HHPT at discharge for safety and walker education.    Follow Up Recommendations  Home health PT           Equipment Recommendations  Rolling walker with 5" wheels       Frequency Min 4X/week   Plan Discharge plan needs to be updated    Precautions / Restrictions Precautions Precautions: Fall Required Braces or Orthoses: Other Brace/Splint Other Brace/Splint: Darco Heel wedge Restrictions LLE Weight Bearing: Partial weight bearing Other Position/Activity Restrictions: able to put weight through heel   Pertinent Vitals/Pain No current complaints    Mobility  Bed Mobility Supine to Sit: 6: Modified independent (Device/Increase time) Transfers Sit to Stand: 4: Min guard;From bed;With upper extremity assist Stand to Sit: 4: Min guard;With upper extremity assist Details for Transfer Assistance: assist due to posterior bias upon standing with Heel Wedge Shoe on Ambulation/Gait Ambulation/Gait Assistance: 4: Min assist Ambulation Distance (Feet): 155 Feet Assistive device: Rolling walker;Other (Comment) (IV pole) Ambulation/Gait Assistance Details: used IV pole for most of distance, then encouraged to try walker as won't have IV pole at home.  Demonstrates increased forward flexion and noted right UE deficits from stroke (but able to grip walker.) Gait Pattern: Decreased step length - left;Trunk flexed;Wide base of support      PT Goals Acute Rehab PT Goals Pt will go Sit to Stand: with modified independence PT Goal: Sit to Stand - Progress: Progressing toward goal Pt will go Stand to Sit: with  modified independence PT Goal: Stand to Sit - Progress: Progressing toward goal Pt will Ambulate: >150 feet;with modified independence;with least restrictive assistive device PT Goal: Ambulate - Progress: Progressing toward goal  Visit Information  Last PT Received On: 12/11/12    Subjective Data  Subjective: I have to go back to surgery tonight.   Cognition  Cognition Overall Cognitive Status: Appears within functional limits for tasks assessed/performed Arousal/Alertness: Awake/alert Orientation Level: Appears intact for tasks assessed Behavior During Session: [facility] for tasks performed    Balance  Balance Balance Assessed: Yes Static Standing Balance Static Standing - Balance Support: No upper extremity supported Static Standing - Level of Assistance: 4: Min assist Static Standing - Comment/# of Minutes: initial posterior bias  in standing due to Heel wedge shoe  End of Session PT - End of Session Equipment Utilized During Treatment: Gait belt Activity Tolerance: Patient limited by fatigue Patient left: in bed   GP     [facility] 12/11/2012, 3:12 PM [name], Albert [phone] 12/11/2012

## 2012-12-11 NOTE — Progress Notes (Signed)
FMTS Daily Intern Progress Note: [id number]  Subjective:  Post op day 3 left 3rd and 5th ray amputation Pain controled with dilaudid No N/V/D, no CP, SOB  I have reviewed the patient's medications.  Objective Temp:  [98.1 F (36.7 C)-99.3 F (37.4 C)] 99.3 F (37.4 C) (02/25 0947) Pulse Rate:  [92-106] 100 (02/25 0947) Resp:  [17-20] 17 (02/25 0947) BP: (124-160)/(69-97) 160/97 mmHg (02/25 0947) SpO2:  [95 %-99 %] 96 % (02/25 0947) Weight:  [245 lb 11.2 oz (111.449 kg)] 245 lb 11.2 oz (111.449 kg) (02/24 2002)   Intake/Output Summary (Last 24 hours) at 12/11/12 0947 Last data filed at 12/11/12 0001  Gross per 24 hour  Intake 2021.38 ml  Output   1100 ml  Net 921.38 ml    CBG (last 3)   Recent Labs  12/10/12 2348 12/11/12 0400 12/11/12 0731  GLUCAP 218* 180* 194*    General: no acute distress HEENT: moist mucous membranes, PERRLA, EOMI CV: S1S2, RRR, no murmur Pulm: cta b/l Ext: left foot s/p 3rd and 5th toe amputations, dressing clean and dry Neuro: no focal deficits.   Labs and Imaging  Recent Labs Lab 12/09/12 0457 12/10/12 0525 12/11/12 0615  WBC 12.9* 13.6* 13.1*  HGB 11.4* 10.9* 11.2*  HCT 33.7* 32.8* 33.2*  PLT 267 284 326     Recent Labs Lab 12/09/12 0457 12/10/12 0525 12/11/12 0615  NA 132* 132* 131*  K 3.5 3.7 4.6  CL 97 96 97  CO2 26 26 24   BUN 16 15 14   CREATININE 1.03 0.88 0.93  GLUCOSE 104* 151* 216*  CALCIUM 8.5 8.2* 8.3*     ABI: >1.0 on right, 0.85 on left.   Assessment and Plan 52 y.o. 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 Poor blood flow likely due to combination of microvascular and large vessel disease disease 2o/2 poorly controlled DM and likely atherosclerosis given hx of prior CVA  # left 3rd and 5th toe necrosis: post op day 2 s/p 3rd and 5th toe ray amputation.  - continue pain control - has been afebrile and in NSR for >  24 hrs.  WBC still elevated to 13.1 (13.6 yesterday) - Per ortho, may need 2 nd ray amputation as well, will continue to follow recs and greatly appreciate -PT does not warrant further follow up   # peripheral vascular disease: evaluated by vascular surgery.  - Pt went for aortogram showing Posterior tibial artery occluded on the right.    - Vascular surgery plans to do perform bypass on right leg on Thursday 12/13/12.  Greatly appreciate recs   # HTN: on metoprolol 50mg  bid. Home irbesartan/HCTZ 300/12.5mg  restart today as pt BP up to 160 systolic and creatinine stable around 0.9.  # Acute renal failure: most recent cr: 0.66 in 2012.  Creatinine improving today at 0.88 from 1.03 from 1.41.  - Will restart ARB today and repeat BMP in AM  # DM: A1C: 9.0. On levemir 20qhs at home with novlolg 12 tid with meals and glipizide/metformin - on lantus 10u in hospital and requiring 40u novolog in 24hrs. Increase lantus to 12u  -CBG have been around 200.  Consider increasing Lantus another 2 U in PM  FEN: carb modified PPx: heparin 5000 tid Dispo: pending improvement and vascular eval.   Zaraya Delauder R. Paulina Fusi, DO of [facility] 12/11/2012, 9:46 AM

## 2012-12-11 NOTE — Transfer of Care (Signed)
Immediate Anesthesia Transfer of Care Note  Patient: Albert [name]  Procedure(s) Performed: Procedure(s): REPEAT I&D LEFT FOOT (Left) Amputation of 2nd Toe (Left)  Patient Location: PACU  Anesthesia Type:General  Level of Consciousness: oriented, sedated, patient cooperative and responds to stimulation  Airway & Oxygen Therapy: Patient Spontanous Breathing and Patient connected to nasal cannula oxygen  Post-op Assessment: Report given to PACU RN, Post -op Vital signs reviewed and stable and Patient moving all extremities X 4  Post vital signs: Reviewed and stable  Complications: No apparent anesthesia complications

## 2012-12-11 NOTE — Anesthesia Preprocedure Evaluation (Signed)
Anesthesia Evaluation  Patient identified by MRN, date of birth, ID band Patient awake    Reviewed: Allergy & Precautions, H&P , NPO status , Patient's Chart, lab work & pertinent test results, reviewed documented beta blocker date and time   Airway Mallampati: II TM Distance: >3 FB Neck ROM: full    Dental   Pulmonary neg pulmonary ROS,  breath sounds clear to auscultation        Cardiovascular hypertension, On Medications and On Home Beta Blockers + Peripheral Vascular Disease Rhythm:regular     Neuro/Psych CVA negative neurological ROS  negative psych ROS   GI/Hepatic negative GI ROS, Neg liver ROS,   Endo/Other  diabetes, Insulin Dependent  Renal/GU negative Renal ROS  negative genitourinary   Musculoskeletal   Abdominal   Peds  Hematology negative hematology ROS (+)   Anesthesia Other Findings See surgeon's H&P   Reproductive/Obstetrics negative OB ROS                           Anesthesia Physical Anesthesia Plan  ASA: III  Anesthesia Plan: General   Post-op Pain Management:    Induction: Intravenous, Rapid sequence and Cricoid pressure planned  Airway Management Planned: Oral ETT  Additional Equipment:   Intra-op Plan:   Post-operative Plan: Extubation in OR  Informed Consent: I have reviewed the patients History and Physical, chart, labs and discussed the procedure including the risks, benefits and alternatives for the proposed anesthesia with the patient or authorized representative who has indicated his/her understanding and acceptance.   Dental Advisory Given  Plan Discussed with: CRNA and Surgeon  Anesthesia Plan Comments:         Anesthesia Quick Evaluation

## 2012-12-11 NOTE — Progress Notes (Signed)
Patient ID: Albert [name], male   DOB: [birth year], 52 y.o.   MRN: [id number] Looking at his left foot wound today, he has increased necrosis.  He will need a repeat I&D tonight and a second toe amputation.  I've spoken to him about this in length and he does wish to proceed and understands this completely.

## 2012-12-11 NOTE — Progress Notes (Signed)
Family Medicine Teaching Service Attending Note  I interviewed and examined patient Albert [name] and reviewed their tests and x-rays.  I discussed with Dr. Paulina Fusi and reviewed their note for today.  I agree with their assessment and plan.     Additionally  Feels generally well No signs of systemic infection Proceed as per ortho and vascular. We will attempt to keep blood pressure and blood sugar under good control for optimal healing

## 2012-12-11 NOTE — Progress Notes (Signed)
[facility]  Patient Status: Active (receiving services up to time of hospitalization)  [facility] is providing the following services: RN  If patient discharges after hours, please call [phone].   Wynelle Bourgeois 12/11/2012, 4:00 PM

## 2012-12-11 NOTE — Progress Notes (Addendum)
VASCULAR LAB PRELIMINARY  PRELIMINARY  PRELIMINARY  PRELIMINARY  Carotid Doppler Preliminary results: Bilateral:  No evidence of hemodynamically significant internal carotid artery stenosis.   Vertebral artery flow is antegrade.      Right Lower Extremity Vein Map    Right Great Saphenous Vein   Segment Diameter Comment  1. Origin 7.83mm   2. High Thigh 4.66mm   3. Mid Thigh 3.71mm   4. Low Thigh 3.44mm   5. At Knee 3.44mm   6. High Calf 3.55mm Branch  7. Low Calf 1.67mm   8. Ankle 1.45mm    mm    mm    mm     Right Small Saphenous Vein  Segment Diameter Comment  1. Origin 2.52mm   2. High Calf 1.32mm   3. Low Calf 2.37mm   4. Ankle 3.77mm    mm    mm    mm      Left Lower Extremity Vein Map    Left Great Saphenous Vein   Segment Diameter Comment  1. Origin 3.50mm   2. High Thigh 3.92mm   3. Mid Thigh 3.46mm   4. Low Thigh 3.27mm   5. At Knee 3.51mm   6. High Calf 3.103mm   7. Low Calf 2.103mm Branch, thickening  8. Ankle 3.35mm    mm    mm    mm     Left Small Saphenous Vein  Segment Diameter Comment  1. Origin 3.71mm Chronic thrombus  2. High Calf 2.90mm thickening  3. Low Calf 2.27mm thickening  4. Ankle 2.37mm    mm    mm    mm     Albert [name], RDMS, RVT 12/11/2012, 11:56 AM

## 2012-12-11 NOTE — Progress Notes (Signed)
Dr. Myra Gianotti spoke with the pt this am to inform him that he needs an below the knee to posterior tibial bypass.  He has tentatively planned this for Thursday.  Will get a cardiology consult for surgical clearance. Vein mapping has been ordered as well as a pre operative carotid duplex U/S.  RHYNE, SAMANTHA 12/11/2012 9:07 AM  I agree with the above.  The patient will be scheduled for left below knee pop to PT bypass on Thursday.  I have reviewed his vein mapping which shows an adequate vein on the left.  He has minimal carotid stenosis.  Cardiology consult pending.  WElls Cristin Penaflor

## 2012-12-11 NOTE — Anesthesia Postprocedure Evaluation (Signed)
Anesthesia Post Note  Patient: Albert [name]  Procedure(s) Performed: Procedure(s) (LRB): REPEAT I&D LEFT FOOT (Left) Amputation of 2nd Toe (Left)  Anesthesia type: General  Patient location: PACU  Post pain: Pain level controlled  Post assessment: Patient's Cardiovascular Status Stable  Last Vitals:  Filed Vitals:   12/11/12 2100  BP: 162/95  Pulse: 97  Temp:   Resp: 25    Post vital signs: Reviewed and stable  Level of consciousness: alert  Complications: No apparent anesthesia complications

## 2012-12-12 ENCOUNTER — Encounter (HOSPITAL_COMMUNITY): Payer: Self-pay | Admitting: Sports Medicine

## 2012-12-12 ENCOUNTER — Inpatient Hospital Stay (HOSPITAL_COMMUNITY): Payer: 59

## 2012-12-12 DIAGNOSIS — Z01810 Encounter for preprocedural cardiovascular examination: Secondary | ICD-10-CM

## 2012-12-12 LAB — BASIC METABOLIC PANEL
CO2: 28 mEq/L (ref 19–32)
Chloride: 97 mEq/L (ref 96–112)
Glucose, Bld: 245 mg/dL — ABNORMAL HIGH (ref 70–99)
Sodium: 133 mEq/L — ABNORMAL LOW (ref 135–145)

## 2012-12-12 LAB — GLUCOSE, CAPILLARY
Glucose-Capillary: 256 mg/dL — ABNORMAL HIGH (ref 70–99)
Glucose-Capillary: 239 mg/dL — ABNORMAL HIGH (ref 70–99)

## 2012-12-12 MED ORDER — PERFLUTREN LIPID MICROSPHERE
1.0000 mL | INTRAVENOUS | Status: AC | PRN
  Administered 2012-12-12 (×2): 2 mL via INTRAVENOUS
  Filled 2012-12-12: qty 10

## 2012-12-12 MED ORDER — INSULIN GLARGINE 100 UNIT/ML ~~LOC~~ SOLN
30.0000 [IU] | Freq: Every day | SUBCUTANEOUS | Status: DC
  Administered 2012-12-12 – 2012-12-15 (×4): 30 [IU] via SUBCUTANEOUS

## 2012-12-12 MED ORDER — VANCOMYCIN HCL IN DEXTROSE 1-5 GM/200ML-% IV SOLN
1000.0000 mg | INTRAVENOUS | Status: AC
  Administered 2012-12-13: 1000 mg via INTRAVENOUS
  Filled 2012-12-12: qty 200

## 2012-12-12 MED ORDER — METOPROLOL TARTRATE 100 MG PO TABS
100.0000 mg | ORAL_TABLET | Freq: Two times a day (BID) | ORAL | Status: DC
  Administered 2012-12-12: 100 mg via ORAL
  Filled 2012-12-12 (×3): qty 1

## 2012-12-12 MED ORDER — HYDROCHLOROTHIAZIDE 12.5 MG PO CAPS
12.5000 mg | ORAL_CAPSULE | Freq: Every day | ORAL | Status: DC
  Administered 2012-12-12 – 2012-12-18 (×7): 12.5 mg via ORAL
  Filled 2012-12-12 (×7): qty 1

## 2012-12-12 NOTE — Progress Notes (Signed)
FMTS Daily Intern Progress Note: [id number]  Subjective:  Post op day 4 left 3rd and 5th ray amputation. POD # 1 for 2nd ray amputation Pain controled  No N/V/D, no CP, SOB  I have reviewed the patient's medications.  Objective Temp:  [98.2 F (36.8 C)-99.3 F (37.4 C)] 98.7 F (37.1 C) (02/26 0920) Pulse Rate:  [73-105] 96 (02/26 0920) Resp:  [15-25] 18 (02/26 0920) BP: (119-169)/(70-103) 159/97 mmHg (02/26 0920) SpO2:  [94 %-98 %] 96 % (02/26 0920) Weight:  [249 lb 6.4 oz (113.127 kg)] 249 lb 6.4 oz (113.127 kg) (02/25 2157)   Intake/Output Summary (Last 24 hours) at 12/12/12 0946 Last data filed at 12/12/12 0900  Gross per 24 hour  Intake 1860.67 ml  Output   2900 ml  Net -1039.33 ml    CBG (last 3)   Recent Labs  12/11/12 1633 12/11/12 2109 12/12/12 0737  GLUCAP 246* 230* 253*    General: no acute distress HEENT: moist mucous membranes, PERRLA, EOMI CV: S1S2, RRR, no murmur Pulm: cta b/l Ext: dressing clean and dry Neuro: no focal deficits.   Labs and Imaging  Recent Labs Lab 12/09/12 0457 12/10/12 0525 12/11/12 0615  WBC 12.9* 13.6* 13.1*  HGB 11.4* 10.9* 11.2*  HCT 33.7* 32.8* 33.2*  PLT 267 284 326     Recent Labs Lab 12/10/12 0525 12/11/12 0615 12/12/12 0500  NA 132* 131* 133*  K 3.7 4.6 4.2  CL 96 97 97  CO2 26 24 28   BUN 15 14 13   CREATININE 0.88 0.93 0.95  GLUCOSE 151* 216* 245*  CALCIUM 8.2* 8.3* 8.8     ABI: >1.0 on right, 0.85 on left.   Assessment and Plan 52 y.o. 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 Poor blood flow likely due to combination of microvascular and large vessel disease disease 2o/2 poorly controlled DM and likely atherosclerosis given hx of prior CVA  # left 3rd and 5th toe necrosis: post op day 4 s/p 3rd and 5th toe ray amputation. POD #1 2nd ray amputation  - continue pain control - has been afebrile and in NSR for  > 24 hrs.   -PT does not warrant further follow up   # peripheral vascular disease: evaluated by vascular surgery.  - Pt went for aortogram showing Posterior tibial artery occluded on the right and left  - Vascular surgery plans to do perform bypass on  on Thursday 12/13/12.  Greatly appreciate recs   - Cardiology cleared patient for pre-op, will be getting Pre-op Echo and CXR (normal)   # HTN: on metoprolol 50mg  bid. Home irbesartan/HCTZ 300/12.5mg  restarted as pt BP up to 160 systolic and creatinine stable around 0.9.   # Acute renal failure: most recent cr: 0.66 in 2012.  - restarted ARB yesterday.  Creatinine stable - Now Resolved   # DM: A1C: 9.0. On levemir 20qhs at home with novlolg 12 tid with meals and glipizide/metformin - on lantus 10u in hospital and requiring 40u novolog in 24hrs. Increase lantus to 20 U yesterday and CBG still > 200.  Will increase to 30 U tonight prior to surgery   FEN: carb modified PPx: heparin 5000 tid Dispo: pending improvement and vascular eval.   Katori Wirsing R. Paulina Fusi, DO of [facility] 12/12/2012, 8:17 AM

## 2012-12-12 NOTE — Progress Notes (Signed)
Family Medicine Teaching Service Attending Note  I discussed patient Albert [name]  with Dr. Hess and reviewed their note for today.  I agree with their assessment and plan.      

## 2012-12-12 NOTE — Progress Notes (Signed)
PT Cancellation Note  Patient Details Name: EMRICK HENSCH MRN: [id number] DOB: 06/18/1961   Cancelled Treatment:    Reason Eval/Treat Not Completed: Medical issues which prohibited therapy (s/p ray amp on L on 2/25. plans for BPG on 2/27. will need reorder for activity after procedure. )   Rada Hay 12/12/2012, 12:16 PM

## 2012-12-12 NOTE — Progress Notes (Signed)
OT  Cancellation Note  Treatment cancelled today due to medical issues with patient which prohibited therapy. Per MD note, plans for BPG on 2/27 (s/p L ray amp on 2/25). Will hold OT eval until pt medically appropriate.  12/12/2012 Cipriano Mile OTR/L Pager [phone] Office [phone]

## 2012-12-12 NOTE — Progress Notes (Signed)
Inpatient Diabetes Program Recommendations  AACE/ADA: New Consensus Statement on Inpatient Glycemic Control (2013)  Target Ranges:  Prepandial:   less than 140 mg/dL      Peak postprandial:   less than 180 mg/dL (1-2 hours)      Critically ill patients:  140 - 180 mg/dL    Results for RHODES, CALVERT (MRN [id number]) as of 12/12/2012 09:51  Ref. Range 12/11/2012 07:31 12/11/2012 12:19 12/11/2012 16:33 12/11/2012 21:09  Glucose-Capillary Latest Range: 70-99 mg/dL 409 (H) 811 (H) 914 (H) 230 (H)   Results for OMEGA, SLAGER (MRN [id number]) as of 12/12/2012 09:51  Ref. Range 12/12/2012 07:37  Glucose-Capillary Latest Range: 70-99 mg/dL 086 (H)   Patient continues to have early AM and afternoon glucose elevations.  Noted Lantus increased to 30 units QHS today.  Also noted plans for patient to go to OR again tomorrow.  When patient is not NPO, patient is eating 100% of meals.  MD- Please consider adding meal coverage to patient's in-hospital regimen- Novolog 4 units tid with meals   Note: Will follow. Ambrose Finland RN, MSN, CDE Diabetes Coordinator Inpatient Diabetes Program [phone]

## 2012-12-12 NOTE — Progress Notes (Signed)
  Echocardiogram 2D Echocardiogram with Definity has been performed.  Cathie Beams 12/12/2012, 10:55 AM

## 2012-12-12 NOTE — Op Note (Signed)
NAMECHI, GARLOW NO.:  [id number]  MEDICAL RECORD NO.:  [id number]  LOCATION:  6710                         FACILITY:  [facility]  PHYSICIAN:  Vanita Panda. Magnus Ivan, M.D.DATE OF BIRTH:  May 05, 1961  DATE OF PROCEDURE:  12/11/2012 DATE OF DISCHARGE:                              OPERATIVE REPORT   PREOPERATIVE DIAGNOSES:  Ischemic left foot with previous third and fifth ray resections and continued ischemic changes.  POSTOPERATIVE DIAGNOSES:  Ischemic left foot with previous third and fifth ray resections and continued ischemic changes  PROCEDURES: 1. Repeat irrigation and debridement of left foot including skin, soft     tissue and bone. 2. Second ray amputation. 3. Closure of left foot wound.  SURGEON:  Vanita Panda. Magnus Ivan, M.D.  ANESTHESIA:  General.  BLOOD LOSS:  Minimal.  COMPLICATIONS:  None.  INDICATION:  Mr. Repass is a 52 year old gentleman with poorly-controlled diabetes and severe peripheral vascular disease.  He presented to the ER on this past Friday with a necrotic, completely black, gangrenous third toe, a dusky fifth toe and a previous fourth toe amputation from a month earlier that had a chronic open draining wound.  He was taken to the OR the next day where he underwent a third and fifth toe amputations through the ray at the metatarsal, irrigation and debridement, and closure, but we could not close the wound completely to the second toe. He was taken back to the OR today because the vascular surgeons have seen him and undergoing to perform bypass graft on Thursday; however, he has necrotic area by second toe and I think this toe needs to be removed.  So, we can get closure on the wound to then hopefully allow for healing if the vascular surgeons successfully re-perfusing the leg. I have talked to him about this in general and he does wish to proceed with surgery given the continued necrosis.  PROCEDURE DESCRIPTION:  After  informed consent was obtained, appropriate left foot was marked.  He was brought to the operating room and placed supine on the operating room table, general anesthesia was then obtained.  His left foot was then prepped and draped with Betadine scrub and paint.  A time-out was called to identify the correct patient and correct left foot.  I then removed the previous sutures and excised the soft tissue including skin, deep tissue, and muscle.  I used sharply with a #10 blade.  I then ellipsed around the second toe and removed the second toe through the MTP joint.  I then used a rongeur to backup all metatarsals and sharp knife to again continue to debride.  I did not get any significant bleeding at all throughout the wound.  I then copiously irrigated the wound with normal saline using pulsatile lavage and 3 liters of normal saline.  I reapproximated the skin flaps loosely again with concern that he may not heal this.  Xeroform and well-padded sterile dressing were applied.  He was awakened, extubated and taken to recovery room in stable condition.  All final counts were correct. There were no complications noted.     Vanita Panda. Magnus Ivan, M.D.     CYB/MEDQ  D:  12/11/2012  T:  12/12/2012  Job:  [id number]

## 2012-12-13 ENCOUNTER — Encounter (HOSPITAL_COMMUNITY): Payer: Self-pay | Admitting: Anesthesiology

## 2012-12-13 ENCOUNTER — Inpatient Hospital Stay (HOSPITAL_COMMUNITY): Payer: 59 | Admitting: Anesthesiology

## 2012-12-13 ENCOUNTER — Encounter (HOSPITAL_COMMUNITY)
Admission: EM | Disposition: A | Discharge status: Home / Self Care | Payer: Self-pay | Source: Home / Self Care | Attending: Family Medicine

## 2012-12-13 ENCOUNTER — Telehealth: Payer: Self-pay | Admitting: Surgery

## 2012-12-13 HISTORY — PX: BYPASS GRAFT POPLITEAL TO TIBIAL: SHX5764

## 2012-12-13 LAB — CBC
HCT: 31.2 % — ABNORMAL LOW (ref 39.0–52.0)
Hemoglobin: 11.1 g/dL — ABNORMAL LOW (ref 13.0–17.0)
MCH: 26.6 pg (ref 26.0–34.0)
MCH: 27.3 pg (ref 26.0–34.0)
MCHC: 33.2 g/dL (ref 30.0–36.0)
MCHC: 34.6 g/dL (ref 30.0–36.0)
Platelets: 341 10*3/uL (ref 150–400)
RDW: 13.2 % (ref 11.5–15.5)
RDW: 13.1 % (ref 11.5–15.5)

## 2012-12-13 LAB — BASIC METABOLIC PANEL
BUN: 13 mg/dL (ref 6–23)
Chloride: 93 mEq/L — ABNORMAL LOW (ref 96–112)
Creatinine, Ser: 0.9 mg/dL (ref 0.50–1.35)
GFR calc Af Amer: >90 mL/min (ref >90)
GFR calc non Af Amer: >90 mL/min (ref >90)
Glucose, Bld: 241 mg/dL — ABNORMAL HIGH (ref 70–99)

## 2012-12-13 LAB — GLUCOSE, CAPILLARY: Glucose-Capillary: 229 mg/dL — ABNORMAL HIGH (ref 70–99)

## 2012-12-13 LAB — APTT: aPTT: 32 seconds (ref 24–37)

## 2012-12-13 SURGERY — CREATION, BYPASS, ARTERIAL, POPLITEAL TO TIBIAL, USING GRAFT
Anesthesia: General | Site: Leg Lower | Laterality: Left | Wound class: Clean

## 2012-12-13 MED ORDER — LIDOCAINE HCL (CARDIAC) 20 MG/ML IV SOLN
INTRAVENOUS | Status: DC | PRN
  Administered 2012-12-13: 100 mg via INTRAVENOUS

## 2012-12-13 MED ORDER — FENTANYL CITRATE 0.05 MG/ML IJ SOLN
INTRAMUSCULAR | Status: DC | PRN
  Administered 2012-12-13 (×2): 50 ug via INTRAVENOUS
  Administered 2012-12-13: 100 ug via INTRAVENOUS
  Administered 2012-12-13: 50 ug via INTRAVENOUS

## 2012-12-13 MED ORDER — DOCUSATE SODIUM 100 MG PO CAPS
100.0000 mg | ORAL_CAPSULE | Freq: Every day | ORAL | Status: DC
  Administered 2012-12-14 – 2012-12-18 (×5): 100 mg via ORAL
  Filled 2012-12-13 (×5): qty 1

## 2012-12-13 MED ORDER — HYDRALAZINE HCL 20 MG/ML IJ SOLN
10.0000 mg | INTRAMUSCULAR | Status: DC | PRN
  Filled 2012-12-13: qty 0.5

## 2012-12-13 MED ORDER — ROCURONIUM BROMIDE 100 MG/10ML IV SOLN
INTRAVENOUS | Status: DC | PRN
  Administered 2012-12-13: 50 mg via INTRAVENOUS

## 2012-12-13 MED ORDER — LACTATED RINGERS IV SOLN
INTRAVENOUS | Status: DC | PRN
  Administered 2012-12-13 (×3): via INTRAVENOUS

## 2012-12-13 MED ORDER — VANCOMYCIN HCL IN DEXTROSE 1-5 GM/200ML-% IV SOLN
1000.0000 mg | Freq: Two times a day (BID) | INTRAVENOUS | Status: AC
  Administered 2012-12-13 – 2012-12-14 (×2): 1000 mg via INTRAVENOUS
  Filled 2012-12-13 (×2): qty 200

## 2012-12-13 MED ORDER — SODIUM CHLORIDE 0.9 % IV SOLN: 500.0000 mL | Freq: Once | INTRAVENOUS | Status: AC | PRN

## 2012-12-13 MED ORDER — GUAIFENESIN-DM 100-10 MG/5ML PO SYRP: 15.0000 mL | ORAL_SOLUTION | ORAL | Status: DC | PRN

## 2012-12-13 MED ORDER — MIDAZOLAM HCL 5 MG/5ML IJ SOLN
INTRAMUSCULAR | Status: DC | PRN
  Administered 2012-12-13: 2 mg via INTRAVENOUS

## 2012-12-13 MED ORDER — PROPOFOL 10 MG/ML IV BOLUS
INTRAVENOUS | Status: DC | PRN
  Administered 2012-12-13: 200 mg via INTRAVENOUS

## 2012-12-13 MED ORDER — EPHEDRINE SULFATE 50 MG/ML IJ SOLN
INTRAMUSCULAR | Status: DC | PRN
  Administered 2012-12-13 (×2): 5 mg via INTRAVENOUS
  Administered 2012-12-13: 10 mg via INTRAVENOUS

## 2012-12-13 MED ORDER — ONDANSETRON HCL 4 MG/2ML IJ SOLN
INTRAMUSCULAR | Status: DC | PRN
  Administered 2012-12-13: 4 mg via INTRAVENOUS

## 2012-12-13 MED ORDER — HEPARIN SODIUM (PORCINE) 1000 UNIT/ML IJ SOLN
INTRAMUSCULAR | Status: DC | PRN
  Administered 2012-12-13: 9000 [IU] via INTRAVENOUS

## 2012-12-13 MED ORDER — OXYCODONE HCL 5 MG PO TABS: 5.0000 mg | ORAL_TABLET | Freq: Once | ORAL | Status: DC | PRN

## 2012-12-13 MED ORDER — HYDROMORPHONE HCL PF 1 MG/ML IJ SOLN
0.2500 mg | INTRAMUSCULAR | Status: DC | PRN
Start: 2012-12-13 — End: 2012-12-13
  Administered 2012-12-13: 0.5 mg via INTRAVENOUS

## 2012-12-13 MED ORDER — DOPAMINE-DEXTROSE 3.2-5 MG/ML-% IV SOLN
3.0000 ug/kg/min | INTRAVENOUS | Status: DC
Start: 2012-12-13 — End: 2012-12-14

## 2012-12-13 MED ORDER — SODIUM CHLORIDE 0.9 % IV SOLN
INTRAVENOUS | Status: DC
  Administered 2012-12-13: 100 mL/h via INTRAVENOUS

## 2012-12-13 MED ORDER — OXYCODONE HCL 5 MG/5ML PO SOLN: 5.0000 mg | Freq: Once | ORAL | Status: DC | PRN

## 2012-12-13 MED ORDER — HEPARIN (PORCINE) IN NACL 100-0.45 UNIT/ML-% IJ SOLN
400.0000 [IU]/h | INTRAMUSCULAR | Status: DC
  Administered 2012-12-13 – 2012-12-16 (×2): 400 [IU]/h via INTRAVENOUS
  Filled 2012-12-13 (×3): qty 250

## 2012-12-13 MED ORDER — ALUM & MAG HYDROXIDE-SIMETH 200-200-20 MG/5ML PO SUSP: 15.0000 mL | ORAL | Status: DC | PRN

## 2012-12-13 MED ORDER — METOCLOPRAMIDE HCL 5 MG/ML IJ SOLN: 10.0000 mg | Freq: Once | INTRAMUSCULAR | Status: DC | PRN

## 2012-12-13 MED ORDER — ALBUMIN HUMAN 5 % IV SOLN
INTRAVENOUS | Status: DC | PRN
  Administered 2012-12-13: 10:00:00 via INTRAVENOUS

## 2012-12-13 MED ORDER — LABETALOL HCL 5 MG/ML IV SOLN
10.0000 mg | INTRAVENOUS | Status: DC | PRN
  Filled 2012-12-13: qty 4

## 2012-12-13 MED ORDER — SODIUM CHLORIDE 0.9 % IR SOLN
Status: DC | PRN
  Administered 2012-12-13: 09:00:00

## 2012-12-13 MED ORDER — MAGNESIUM SULFATE 40 MG/ML IJ SOLN
2.0000 g | Freq: Once | INTRAMUSCULAR | Status: AC | PRN
  Filled 2012-12-13: qty 50

## 2012-12-13 MED ORDER — POTASSIUM CHLORIDE CRYS ER 20 MEQ PO TBCR: 20.0000 meq | EXTENDED_RELEASE_TABLET | Freq: Once | ORAL | Status: AC | PRN

## 2012-12-13 MED ORDER — LIDOCAINE HCL 4 % MT SOLN
OROMUCOSAL | Status: DC | PRN
  Administered 2012-12-13: 4 mL via TOPICAL

## 2012-12-13 MED ORDER — PHENYLEPHRINE HCL 10 MG/ML IJ SOLN
INTRAMUSCULAR | Status: DC | PRN
  Administered 2012-12-13 (×2): 40 ug via INTRAVENOUS
  Administered 2012-12-13: 60 ug via INTRAVENOUS
  Administered 2012-12-13 (×2): 40 ug via INTRAVENOUS

## 2012-12-13 MED ORDER — HYDROMORPHONE HCL PF 1 MG/ML IJ SOLN
INTRAMUSCULAR | Status: AC
  Filled 2012-12-13: qty 1

## 2012-12-13 MED ORDER — PHENOL 1.4 % MT LIQD: 1.0000 | OROMUCOSAL | Status: DC | PRN

## 2012-12-13 MED ORDER — CEFAZOLIN SODIUM-DEXTROSE 2-3 GM-% IV SOLR
INTRAVENOUS | Status: AC
  Administered 2012-12-13: 2 g via INTRAVENOUS
  Filled 2012-12-13: qty 50

## 2012-12-13 MED ORDER — OXYCODONE HCL 5 MG PO TABS
5.0000 mg | ORAL_TABLET | ORAL | Status: DC | PRN
  Administered 2012-12-14 – 2012-12-17 (×12): 10 mg via ORAL
  Filled 2012-12-13 (×12): qty 2

## 2012-12-13 MED ORDER — METOPROLOL TARTRATE 100 MG PO TABS
100.0000 mg | ORAL_TABLET | Freq: Two times a day (BID) | ORAL | Status: DC
  Administered 2012-12-13 – 2012-12-18 (×10): 100 mg via ORAL
  Filled 2012-12-13 (×11): qty 1

## 2012-12-13 MED ORDER — METOPROLOL TARTRATE 1 MG/ML IV SOLN: 2.0000 mg | INTRAVENOUS | Status: DC | PRN

## 2012-12-13 MED ORDER — 0.9 % SODIUM CHLORIDE (POUR BTL) OPTIME
TOPICAL | Status: DC | PRN
  Administered 2012-12-13: 2000 mL

## 2012-12-13 SURGICAL SUPPLY — 62 items
BANDAGE ELASTIC 4 VELCRO ST LF (GAUZE/BANDAGES/DRESSINGS) IMPLANT
BANDAGE ESMARK 6X9 LF (GAUZE/BANDAGES/DRESSINGS) ×1 IMPLANT
BNDG ESMARK 6X9 LF (GAUZE/BANDAGES/DRESSINGS) ×2
CANISTER SUCTION 2500CC (MISCELLANEOUS) ×2 IMPLANT
CLIP TI MEDIUM 24 (CLIP) ×2 IMPLANT
CLIP TI WIDE RED SMALL 24 (CLIP) ×2 IMPLANT
CLOTH BEACON ORANGE TIMEOUT ST (SAFETY) ×2 IMPLANT
COVER SURGICAL LIGHT HANDLE (MISCELLANEOUS) ×4 IMPLANT
CUFF TOURNIQUET SINGLE 24IN (TOURNIQUET CUFF) IMPLANT
CUFF TOURNIQUET SINGLE 34IN LL (TOURNIQUET CUFF) ×2 IMPLANT
CUFF TOURNIQUET SINGLE 44IN (TOURNIQUET CUFF) IMPLANT
DERMABOND ADHESIVE PROPEN (GAUZE/BANDAGES/DRESSINGS) ×1
DERMABOND ADVANCED (GAUZE/BANDAGES/DRESSINGS) ×2
DERMABOND ADVANCED .7 DNX12 (GAUZE/BANDAGES/DRESSINGS) ×2 IMPLANT
DERMABOND ADVANCED .7 DNX6 (GAUZE/BANDAGES/DRESSINGS) ×1 IMPLANT
DRAIN CHANNEL 15F RND FF W/TCR (WOUND CARE) IMPLANT
DRAPE WARM FLUID 44X44 (DRAPE) ×2 IMPLANT
DRAPE X-RAY CASS 24X20 (DRAPES) IMPLANT
DRSG COVADERM 4X10 (GAUZE/BANDAGES/DRESSINGS) IMPLANT
DRSG COVADERM 4X8 (GAUZE/BANDAGES/DRESSINGS) IMPLANT
ELECT REM PT RETURN 9FT ADLT (ELECTROSURGICAL) ×2
ELECTRODE REM PT RTRN 9FT ADLT (ELECTROSURGICAL) ×1 IMPLANT
EVACUATOR SILICONE 100CC (DRAIN) IMPLANT
GLOVE BIO SURGEON STRL SZ 6.5 (GLOVE) ×6 IMPLANT
GLOVE BIOGEL PI IND STRL 6.5 (GLOVE) ×2 IMPLANT
GLOVE BIOGEL PI IND STRL 7.5 (GLOVE) ×1 IMPLANT
GLOVE BIOGEL PI INDICATOR 6.5 (GLOVE) ×2
GLOVE BIOGEL PI INDICATOR 7.5 (GLOVE) ×1
GLOVE SURG SS PI 7.5 STRL IVOR (GLOVE) ×2 IMPLANT
GOWN PREVENTION PLUS XXLARGE (GOWN DISPOSABLE) ×2 IMPLANT
GOWN STRL NON-REIN LRG LVL3 (GOWN DISPOSABLE) ×4 IMPLANT
GOWN STRL REIN 2XL LVL4 (GOWN DISPOSABLE) ×2 IMPLANT
HEMOSTAT SNOW SURGICEL 2X4 (HEMOSTASIS) IMPLANT
HEMOSTAT SURGICEL 2X14 (HEMOSTASIS) IMPLANT
KIT BASIN OR (CUSTOM PROCEDURE TRAY) ×2 IMPLANT
KIT ROOM TURNOVER OR (KITS) ×2 IMPLANT
MARKER GRAFT CORONARY BYPASS (MISCELLANEOUS) IMPLANT
NS IRRIG 1000ML POUR BTL (IV SOLUTION) ×4 IMPLANT
PACK PERIPHERAL VASCULAR (CUSTOM PROCEDURE TRAY) ×2 IMPLANT
PAD ARMBOARD 7.5X6 YLW CONV (MISCELLANEOUS) ×4 IMPLANT
PADDING CAST COTTON 6X4 STRL (CAST SUPPLIES) IMPLANT
SET COLLECT BLD 21X3/4 12 (NEEDLE) IMPLANT
STAPLER VISISTAT 35W (STAPLE) IMPLANT
STOPCOCK 4 WAY LG BORE MALE ST (IV SETS) IMPLANT
SUT ETHILON 3 0 PS 1 (SUTURE) IMPLANT
SUT PROLENE 5 0 C 1 24 (SUTURE) IMPLANT
SUT PROLENE 6 0 BV (SUTURE) ×8 IMPLANT
SUT PROLENE 7 0 BV 1 (SUTURE) ×4 IMPLANT
SUT PROLENE 7 0 BV1 MDA (SUTURE) ×2 IMPLANT
SUT SILK 3 0 (SUTURE) ×1
SUT SILK 3-0 18XBRD TIE 12 (SUTURE) ×1 IMPLANT
SUT VIC AB 2-0 CT1 27 (SUTURE)
SUT VIC AB 2-0 CT1 TAPERPNT 27 (SUTURE) IMPLANT
SUT VIC AB 3-0 SH 27 (SUTURE) ×4
SUT VIC AB 3-0 SH 27X BRD (SUTURE) ×4 IMPLANT
SUT VICRYL 4-0 PS2 18IN ABS (SUTURE) ×4 IMPLANT
TOWEL OR 17X24 6PK STRL BLUE (TOWEL DISPOSABLE) ×4 IMPLANT
TOWEL OR 17X26 10 PK STRL BLUE (TOWEL DISPOSABLE) ×4 IMPLANT
TRAY FOLEY CATH 14FRSI W/METER (CATHETERS) ×2 IMPLANT
TUBING EXTENTION W/L.L. (IV SETS) IMPLANT
UNDERPAD 30X30 INCONTINENT (UNDERPADS AND DIAPERS) ×2 IMPLANT
WATER STERILE IRR 1000ML POUR (IV SOLUTION) ×2 IMPLANT

## 2012-12-13 NOTE — Progress Notes (Signed)
Utilization review completed.  

## 2012-12-13 NOTE — Anesthesia Preprocedure Evaluation (Signed)
Anesthesia Evaluation  Patient identified by MRN, date of birth, ID band Patient awake    Reviewed: Allergy & Precautions, H&P , NPO status , Patient's Chart, lab work & pertinent test results, reviewed documented beta blocker date and time   Airway Mallampati: II TM Distance: >3 FB Neck ROM: full    Dental   Pulmonary neg pulmonary ROS,  breath sounds clear to auscultation        Cardiovascular hypertension, On Medications and On Home Beta Blockers + Peripheral Vascular Disease Rhythm:regular     Neuro/Psych CVA, Residual Symptoms negative psych ROS   GI/Hepatic negative GI ROS, Neg liver ROS,   Endo/Other  diabetes, Insulin Dependent  Renal/GU negative Renal ROS  negative genitourinary   Musculoskeletal   Abdominal   Peds  Hematology negative hematology ROS (+)   Anesthesia Other Findings See surgeon's H&P   Reproductive/Obstetrics negative OB ROS                           Anesthesia Physical Anesthesia Plan  ASA: III  Anesthesia Plan: General   Post-op Pain Management:    Induction: Intravenous  Airway Management Planned: Oral ETT  Additional Equipment:   Intra-op Plan:   Post-operative Plan: Extubation in OR  Informed Consent: I have reviewed the patients History and Physical, chart, labs and discussed the procedure including the risks, benefits and alternatives for the proposed anesthesia with the patient or authorized representative who has indicated his/her understanding and acceptance.   Dental Advisory Given  Plan Discussed with: CRNA and Surgeon  Anesthesia Plan Comments:         Anesthesia Quick Evaluation

## 2012-12-13 NOTE — Addendum Note (Signed)
Addendum created 12/13/12 1824 by Sheppard Evens, CRNA   Modules edited: Anesthesia Medication Administration

## 2012-12-13 NOTE — Anesthesia Procedure Notes (Signed)
Procedure Name: Intubation Date/Time: 12/13/2012 7:39 AM Performed by: Arlice Colt B Pre-anesthesia Checklist: Patient identified, Emergency Drugs available, Suction available, Patient being monitored and Timeout performed Patient Re-evaluated:Patient Re-evaluated prior to inductionOxygen Delivery Method: Circle system utilized Preoxygenation: Pre-oxygenation with 100% oxygen Intubation Type: IV induction Ventilation: Mask ventilation without difficulty Laryngoscope Size: Mac and 3 Grade View: Grade I Tube type: Oral Tube size: 7.5 mm Number of attempts: 1 Airway Equipment and Method: Stylet and LTA kit utilized Placement Confirmation: ETT inserted through vocal cords under direct vision,  positive ETCO2 and breath sounds checked- equal and bilateral Secured at: 23 cm Tube secured with: Tape Dental Injury: Teeth and Oropharynx as per pre-operative assessment

## 2012-12-13 NOTE — Progress Notes (Signed)
[facility] [place]  Subjective  -   The patient required going back to the operating room with orthopedics for re\re debridement. He is resting comfortably in bed today without complaints   Physical Exam:  Cardiovascular: Regular rate and rhythm Pulmonary: Respirations are nonlabored Extremities: The left foot remains bandaged.       Assessment/Plan:  Perform lesser disease with ischemic left foot, status post multiple toe amputations  I discussed with the patient that this is a limb threatening situation. Without restoring blood flow, he will require a below knee amputation. I discussed his best option for limb salvage would be to proceed with a posterior tibial bypass graft. He understands that in spite of doing a bypass, he is still at risk for limb loss. We discussed the details of the procedure, including the vein harvest and bypass graft. We discussed the need for future surveillance. We discussed the possibility of bypass graft failure. I appreciate cardiology's assistance. His echo is pending. We discussed other risks of surgery including cardiopulmonary complications, infection, and the time required to heal from the operation. All his questions were answered. L. proceed with a below knee popliteal to posterior tibial artery bypass graft with left saphenous vein on Thursday.  BRABHAM IV, V. WELLS 12/13/2012 7:20 AM --  Filed Vitals:   12/13/12 0544  BP: 153/51  Pulse: 89  Temp: 99 F (37.2 C)  Resp: 18    Intake/Output Summary (Last 24 hours) at 12/13/12 0720 Last data filed at 12/13/12 0600  Gross per 24 hour  Intake   1188 ml  Output   3600 ml  Net  -2412 ml     Laboratory CBC    Component Value Date/Time   WBC 13.1* 12/11/2012 0615   HGB 11.2* 12/11/2012 0615   HCT 33.2* 12/11/2012 0615   PLT 326 12/11/2012 0615    BMET    Component Value Date/Time   NA 133* 12/12/2012 0500   K 4.2 12/12/2012 0500   CL 97 12/12/2012 0500   CO2  28 12/12/2012 0500   GLUCOSE 245* 12/12/2012 0500   BUN 13 12/12/2012 0500   CREATININE 0.95 12/12/2012 0500   CALCIUM 8.8 12/12/2012 0500   GFRNONAA >90 12/12/2012 0500   GFRAA >90 12/12/2012 0500    COAG No results found for this basename: INR, PROTIME   No results found for this basename: PTT    Antibiotics Anti-infectives   Start     Dose/Rate Route Frequency Ordered Stop   12/13/12 0000  vancomycin (VANCOCIN) IVPB 1000 mg/200 mL premix     1,000 mg 200 mL/hr over 60 Minutes Intravenous To Surgery 12/12/12 0805 12/13/12 0128   12/08/12 1000  vancomycin (VANCOCIN) IVPB 1000 mg/200 mL premix  Status:  Discontinued     1,000 mg 200 mL/hr over 60 Minutes Intravenous Every 12 hours 12/08/12 0033 12/10/12 1048   12/08/12 0600  piperacillin-tazobactam (ZOSYN) IVPB 3.375 g  Status:  Discontinued     3.375 g 12.5 mL/hr over 240 Minutes Intravenous Every 8 hours 12/08/12 0033 12/10/12 1048   12/08/12 0100  vancomycin (VANCOCIN) 2,000 mg in sodium chloride 0.9 % 500 mL IVPB     2,000 mg 250 mL/hr over 120 Minutes Intravenous  Once 12/08/12 0032 12/08/12 0404   12/07/12 2115  vancomycin (VANCOCIN) IVPB 1000 mg/200 mL premix  Status:  Discontinued     1,000 mg 200 mL/hr over 60 Minutes Intravenous  Once 12/07/12 2104 12/08/12 0032   12/07/12 2115    piperacillin-tazobactam (ZOSYN) IVPB 3.375 g     3.375 g 100 mL/hr over 30 Minutes Intravenous  Once 12/07/12 2104 12/07/12 2232       V. Wells Brabham IV, M.D. [facility]  Office: [phone] Pager:  [phone]  

## 2012-12-13 NOTE — Telephone Encounter (Addendum)
Message copied by Rosalyn Charters on Thu Dec 13, 2012  2:52 PM ------      Message from: [place], [place] K      Created: Thu Dec 13, 2012  2:07 PM      Regarding: schedule                   ----- Message -----         From: Dara Lords, PA         Sent: 12/13/2012  11:54 AM           To: Sharee Pimple, CMA            S/p left pop to PT bypass with reversed SVG by Dr. Myra Gianotti 12/13/12.  F/u with him in 2 weeks.                  Thanks,      Samantha ------  notified pt. of fu appt. with dr. Myra Gianotti on 12-31-12 8:30 am

## 2012-12-13 NOTE — Progress Notes (Signed)
Family Medicine Teaching Service Attending Note  I discussed patient He  with Dr. Paulina Fusi and reviewed their note for today.  I agree with their assessment and plan.

## 2012-12-13 NOTE — Progress Notes (Addendum)
PHARMACY - RENAL ADJUSTMENT OF ANTIBIOTICS  SCr 0.95 CrCl > 90 ml/min  Vancomycin 1 g IV q12h for 2 doses ordered post-op. Dose is appropriate. Pharmacy signing off.  [place], [address].D., BCPS Clinical Pharmacist Pager: [phone] 12/13/2012 3:01 PM

## 2012-12-13 NOTE — H&P (View-Only) (Signed)
[facility] [place]  Subjective  -   The patient required going back to the operating room with orthopedics for re\re debridement. He is resting comfortably in bed today without complaints   Physical Exam:  Cardiovascular: Regular rate and rhythm Pulmonary: Respirations are nonlabored Extremities: The left foot remains bandaged.       Assessment/Plan:  Perform lesser disease with ischemic left foot, status post multiple toe amputations  I discussed with the patient that this is a limb threatening situation. Without restoring blood flow, he will require a below knee amputation. I discussed his best option for limb salvage would be to proceed with a posterior tibial bypass graft. He understands that in spite of doing a bypass, he is still at risk for limb loss. We discussed the details of the procedure, including the vein harvest and bypass graft. We discussed the need for future surveillance. We discussed the possibility of bypass graft failure. I appreciate cardiology's assistance. His echo is pending. We discussed other risks of surgery including cardiopulmonary complications, infection, and the time required to heal from the operation. All his questions were answered. L. proceed with a below knee popliteal to posterior tibial artery bypass graft with left saphenous vein on Thursday.  BRABHAM IV, V. WELLS 12/13/2012 7:20 AM --  Filed Vitals:   12/13/12 0544  BP: 153/51  Pulse: 89  Temp: 99 F (37.2 C)  Resp: 18    Intake/Output Summary (Last 24 hours) at 12/13/12 0720 Last data filed at 12/13/12 0600  Gross per 24 hour  Intake   1188 ml  Output   3600 ml  Net  -2412 ml     Laboratory CBC    Component Value Date/Time   WBC 13.1* 12/11/2012 0615   HGB 11.2* 12/11/2012 0615   HCT 33.2* 12/11/2012 0615   PLT 326 12/11/2012 0615    BMET    Component Value Date/Time   NA 133* 12/12/2012 0500   K 4.2 12/12/2012 0500   CL 97 12/12/2012 0500   CO2  28 12/12/2012 0500   GLUCOSE 245* 12/12/2012 0500   BUN 13 12/12/2012 0500   CREATININE 0.95 12/12/2012 0500   CALCIUM 8.8 12/12/2012 0500   GFRNONAA >90 12/12/2012 0500   GFRAA >90 12/12/2012 0500    COAG No results found for this basename: INR, PROTIME   No results found for this basename: PTT    Antibiotics Anti-infectives   Start     Dose/Rate Route Frequency Ordered Stop   12/13/12 0000  vancomycin (VANCOCIN) IVPB 1000 mg/200 mL premix     1,000 mg 200 mL/hr over 60 Minutes Intravenous To Surgery 12/12/12 0805 12/13/12 0128   12/08/12 1000  vancomycin (VANCOCIN) IVPB 1000 mg/200 mL premix  Status:  Discontinued     1,000 mg 200 mL/hr over 60 Minutes Intravenous Every 12 hours 12/08/12 0033 12/10/12 1048   12/08/12 0600  piperacillin-tazobactam (ZOSYN) IVPB 3.375 g  Status:  Discontinued     3.375 g 12.5 mL/hr over 240 Minutes Intravenous Every 8 hours 12/08/12 0033 12/10/12 1048   12/08/12 0100  vancomycin (VANCOCIN) 2,000 mg in sodium chloride 0.9 % 500 mL IVPB     2,000 mg 250 mL/hr over 120 Minutes Intravenous  Once 12/08/12 0032 12/08/12 0404   12/07/12 2115  vancomycin (VANCOCIN) IVPB 1000 mg/200 mL premix  Status:  Discontinued     1,000 mg 200 mL/hr over 60 Minutes Intravenous  Once 12/07/12 2104 12/08/12 0032   12/07/12 2115  piperacillin-tazobactam (ZOSYN) IVPB 3.375 g     3.375 g 100 mL/hr over 30 Minutes Intravenous  Once 12/07/12 2104 12/07/12 2232       V. Charlena Cross, M.D. [facility] [place] Office: [phone] Pager:  [phone]

## 2012-12-13 NOTE — Discharge Summary (Signed)
Physician Discharge Summary  Patient ID: Albert [name] MRN: [id number] DOB: 1960/11/03 Age: 52 y.o.  Admit date: 12/07/2012 Discharge date: 12/18/2012 Admitting Physician: Tobey Grim, MD  PCP: [name], MD  Consultants:Ortho (Dr. Magnus Ivan), Vascular Surgery  (Dr. Myra Gianotti)     Discharge Diagnosis:  Active Problems:   Gangrene   Diabetes mellitus out of control   Essential hypertension, benign   Atherosclerotic peripheral vascular disease    Hospital Course Mr. Seppala is a 52 y/o male with a prior L 4th toe amputation with poor healing found to have 3rd and 5th toe necrosis of the same foot.   Problem List 1. Left 3rd and 5th digit necrosis of the foot - Pt presented to the hospital with erythema and necrosis around the previous 4th toe amputation.  A CBC was obtained showing a leukocytosis of 14.3.  BCx were not drawn since ABx were started well in advance and he was given Vancomycin and Zosyn to cover for pseudomonas and anaerobic bacteria.  A 3 view Xray was taken of the area that showed possible osteomyelitis and orthopaedic surgery was consulted.  Pt was scheduled to be taken back to the OR for a 3rd and 5th ray resection and vascular surgery was consulted as well.  Pt had ABI done showing Left to be 0.85 and he subsequently had a arterial duplex completed showing complete occlusion of the left posterior tibial artery.  Pt required a posterior tibial artery bypass graft on the left leg and he agreed to this procedure.  This procedure included a bypass graft popliteal to tibial (Left) - Left Popliteal to posterior bypass graft.  Pt did well with the procedure, and on a heparin drip for 72 hours before being transitioned to coumadin.  He was stable at the time of discharge having been off his ABx for > 24 hrs, afebrile, and with a decreasing WBC. He was sent with home health PT/OT/RN  and 5 " walker. Per Vascular, pt will need to be on xarelto 20 mg daily and was started on this  while in the hospital.    2. PAD with posterior tibial artery bypass of the left leg - See above  3. HTN - Pt was continued on his home Lopressor 50 mg BID.  However, his home irbesartan/HCTZ was held initially due to possible contrast studies.  His BP remained elevated and his Lopressor was increased to 100 BID.  His home irbesartan/HCTZ was restarted upon d/c.  4.  Acute Renal Failure - Pt presented with a creatinine of 1.41 (bawseline 0.66).  This was most likely secondary to dehydration and his home ARB was held.  He was hydrated carefully and his creatinine trended down to 0.8 and stable prior to d/c.  He was restarted on his ARB prior to d/c.   5. DM II, uncontrolled - Pt was on 20 U qhs of Levemir at home with Novolog 12 TID with meals and Glipizide/Metformin. These medications were initially held and he was placed on 10 U Lantus qhs and resistant sliding scale.  His sugars continued to be elevated to the 200-300s and his Lantus was slowly increased up until 40 U.  His metformin and Glipizide were restarted upon d/c. His A1C was checked as well and was 9.0 on 12/08/12.         Discharge PE   Filed Vitals:   12/18/12 0409  BP: 135/76  Pulse: 66  Temp: 98 F (36.7 C)  Resp: 17   General:  no acute distress  HEENT: moist mucous membranes, PERRLA, EOMI  CV: S1S2, RRR, no murmur  Pulm: cta b/l  Ext: dressing clean and dry, + medial incision intact without erythema along calf Left Side, no serous drainage, 1+ pitting edema left leg.  Neuro: no focal deficits.       Procedures/Imaging:  Dg Chest 2 View  12/12/2012    IMPRESSION: Borderline cardiomegaly.  No active lung disease.   Original Report Authenticated By: Dwyane Dee, M.D.    Dg Foot Complete Left  12/07/2012    IMPRESSION: Stable chronic deformity of the proximal phalanx fifth toe. There is soft tissue swelling fifth toe.  The patient is status post amputation of the fourth toe.  There is probable prior partial resection of  distal aspect of the fourth metatarsal.  There is cortical irregularity distal aspect of the fourth metatarsal. Osteomyelitis cannot be excluded. Clinical correlation is necessary.  No acute fracture or subluxation.   Original Report Authenticated By: Natasha Mead, M.D.    Echo 12/12/12 - EF 50-55% with mild LVH and grade one diastolic HF.    Labs  CBC  Recent Labs Lab 12/16/12 0450 12/17/12 0500 12/18/12 0600  WBC 12.1* 11.2* 9.3  HGB 11.3* 12.1* 11.5*  HCT 33.7* 36.2* 35.2*  PLT 416* 417* 416*   BMET  Recent Labs Lab 12/16/12 0450 12/17/12 0500 12/18/12 0600  NA 131* 132* 131*  K 4.2 4.6 4.7  CL 94* 97 96  CO2 27 29 27   BUN 13 16 15   CREATININE 0.85 0.99 0.95  CALCIUM 8.8 9.0 9.1  GLUCOSE 221* 208* 250*       Patient condition at time of discharge/disposition: stable  Disposition-home with home health PT/OT/RN   Follow up issues: 1. 2,3,4,5 ray amputation - How pt is managing with home health PT and walking 2. HTN - Elevated BP in hospital, may need dosing adjustment  3. DM - May need to increase Levemir and mealtime coverage.  His A1C was 9.0 and CBG were consistently 200+ despite up to 40 U Lantus and increasing SSI.   Discharge follow up:  Follow-up Information   Follow up with Myra Gianotti IV, Lala Lund, MD In 2 weeks. (Office will call you to arrange your appt (sent))    Contact information:   [address] [place] [address] [phone]       Follow up with HASANAJ,XAJE A, MD. Schedule an appointment as soon as possible for a visit in 1 week.      Follow up with Kathryne Hitch, MD In 1 week.   Contact information:   [address] [place] [address] [phone]       Follow up with Myra Gianotti IV, Lala Lund, MD In 2 weeks.   Contact information:   [address] [place] [address] [phone]      Discharge Orders   Future Appointments Provider Department Dept Phone   12/31/2012 8:30 AM Nada Libman, MD [facility] [phone]   Future Orders Complete By Expires     Call MD for:  persistant nausea and vomiting  As directed     Call MD for:  severe uncontrolled pain  As directed     Call MD for:  temperature >100.4  As directed     Increase activity slowly  As directed         Discharge Instructions: Please refer to Patient Instructions section of EMR for full details.  Patient was counseled  important signs and symptoms that should prompt return to medical care, changes in medications, dietary instructions, activity restrictions, and follow up appointments.  Significant instructions noted below:    Discharge Medications   Medication List    TAKE these medications       aspirin 81 MG tablet  Take 81 mg by mouth daily.     collagenase ointment  Commonly known as:  SANTYL  Apply topically daily.     EQUATE PO  Take 1 tablet by mouth daily.     glyBURIDE-metformin 2.5-500 MG per tablet  Commonly known as:  GLUCOVANCE  Take 1 tablet by mouth daily with breakfast.     HYDROcodone-acetaminophen 5-325 MG per tablet  Commonly known as:  NORCO/VICODIN  Take 1 tablet by mouth every 6 (six) hours as needed for pain.     insulin detemir 100 UNIT/ML injection  Commonly known as:  LEVEMIR  Inject 20 Units into the skin every evening.     irbesartan-hydrochlorothiazide 300-12.5 MG per tablet  Commonly known as:  AVALIDE  Take 1 tablet by mouth daily.     metoprolol tartrate 25 MG tablet  Commonly known as:  LOPRESSOR  Take 25 mg by mouth 2 (two) times daily.     NOVOLOG FLEXPEN 100 UNIT/ML injection  Generic drug:  insulin aspart  Inject 12 Units into the skin 3 (three) times daily. Take every time he eats per patient     ondansetron 8 MG tablet  Commonly known as:  ZOFRAN  Take by mouth every 8 (eight) hours as needed for nausea. For upset stomach     oxyCODONE 5 MG immediate release tablet  Commonly known as:  Oxy IR/ROXICODONE  Take 1-2 tablets (5-10 mg  total) by mouth every 4 (four) hours as needed.     Rivaroxaban 20 MG Tabs  Commonly known as:  XARELTO  Take 1 tablet (20 mg total) by mouth daily with supper.     simvastatin 40 MG tablet  Commonly known as:  ZOCOR  Take 40 mg by mouth every evening.     traMADol 50 MG tablet  Commonly known as:  ULTRAM  Take 50 mg by mouth every 6 (six) hours as needed for pain. For pain            Gildardo Cranker, DO of [facility] 12/18/2012 9:32 AM

## 2012-12-13 NOTE — Anesthesia Postprocedure Evaluation (Signed)
Anesthesia Post Note  Patient: Albert [name]  Procedure(s) Performed: Procedure(s) (LRB): BYPASS GRAFT POPLITEAL TO TIBIAL (Left)  Anesthesia type: general  Patient location: PACU  Post pain: Pain level controlled  Post assessment: Patient's Cardiovascular Status Stable  Last Vitals:  Filed Vitals:   12/13/12 1430  BP:   Pulse:   Temp: 36.8 C  Resp:     Post vital signs: Reviewed and stable  Level of consciousness: sedated  Complications: No apparent anesthesia complications

## 2012-12-13 NOTE — Progress Notes (Signed)
FMTS Daily Intern Progress Note: [id number]  Subjective:  Post op day 5 left 3rd and 5th ray amputation. POD # 2 for 2nd ray amputation. POD #0 for lower extremity bypass on left  Pain controled  No N/V/D, no CP, SOB  I have reviewed the patient's medications.  Objective Temp:  [98.4 F (36.9 C)-99.4 F (37.4 C)] 99 F (37.2 C) (02/27 0544) Pulse Rate:  [88-101] 89 (02/27 0544) Resp:  [18] 18 (02/27 0544) BP: (138-175)/(51-97) 153/51 mmHg (02/27 0544) SpO2:  [96 %-100 %] 100 % (02/27 0544) Weight:  [249 lb 6.4 oz (113.127 kg)] 249 lb 6.4 oz (113.127 kg) (02/26 2035)   Intake/Output Summary (Last 24 hours) at 12/13/12 0856 Last data filed at 12/13/12 0845  Gross per 24 hour  Intake   2188 ml  Output   4000 ml  Net  -1812 ml    CBG (last 3)   Recent Labs  12/12/12 1635 12/12/12 2039 12/13/12 0550  GLUCAP 239* 296* 256*    General: no acute distress HEENT: moist mucous membranes, PERRLA, EOMI CV: S1S2, RRR, no murmur Pulm: cta b/l Ext: dressing clean and dry, + medial incision intact without erythema along calf Left Side Neuro: no focal deficits.   Labs and Imaging  Recent Labs Lab 12/09/12 0457 12/10/12 0525 12/11/12 0615  WBC 12.9* 13.6* 13.1*  HGB 11.4* 10.9* 11.2*  HCT 33.7* 32.8* 33.2*  PLT 267 284 326     Recent Labs Lab 12/10/12 0525 12/11/12 0615 12/12/12 0500  NA 132* 131* 133*  K 3.7 4.6 4.2  CL 96 97 97  CO2 26 24 28   BUN 15 14 13   CREATININE 0.88 0.93 0.95  GLUCOSE 151* 216* 245*  CALCIUM 8.2* 8.3* 8.8   ECHO 12/12/12 - EF 50-55%, grade one diastolic HF   ABI: >1.0 on right, 0.85 on left.   Assessment and Plan 52 y.o. 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 Poor blood flow likely due to combination of microvascular and large vessel disease disease 2o/2 poorly controlled DM and likely atherosclerosis given hx of prior CVA  # left 3rd and 5th  toe necrosis: post op day 5 s/p 3rd and 5th toe ray amputation. POD #2 2nd ray amputation  - continue pain control - has been afebrile and in NSR for > 24 hrs.   -PT does not warrant further follow up   # peripheral vascular disease: evaluated by vascular surgery.  - Pt went for aortogram showing Posterior tibial artery occluded on the right and left  - Vascular surgery performed bypass on left side today.  Greatly appreciate recommendations   - Cardiology cleared patient for pre-op   # HTN: on metoprolol 50mg  bid. Home irbesartan/HCTZ 300/12.5mg  restarted as pt BP up to 160 systolic and creatinine stable around 0.9. D/C ARB due to surgery with possible dye today.  Increased Metoprolol to 100 mg BID and will restart ARB 24 hrs s/p surgery today.    # Acute renal failure: most recent cr: 0.66 in 2012.  - Creatinine stable - Now Resolved   # DM: A1C: 9.0. On levemir 20qhs at home with novlolg 12 tid with meals and glipizide/metformin - . Increase lantus to 30 U yesterday, will increase to 40 U .  Continue to follow   FEN: carb modified PPx: heparin 5000 tid Dispo: Pending vascular recommendations, possible d/c tomorrow afternoon  Albert [name] Albert Fusi, DO of [facility] 12/13/2012, 8:56 AM

## 2012-12-13 NOTE — Interval H&P Note (Signed)
History and Physical Interval Note:  12/13/2012 7:23 AM  Albert [name]  has presented today for surgery, with the diagnosis of Peripheral Arterial Disease  The various methods of treatment have been discussed with the patient and family. After consideration of risks, benefits and other options for treatment, the patient has consented to  Procedure(s) with comments: BYPASS GRAFT POPLITEAL TO TIBIAL (Left) - Left Popliteal to Posterior Tibial Bypass Graft as a surgical intervention .  The patient's history has been reviewed, patient examined, no change in status, stable for surgery.  I have reviewed the patient's chart and labs.  Questions were answered to the patient's satisfaction.     BRABHAM IV, V. WELLS

## 2012-12-13 NOTE — Op Note (Signed)
[facility]  Patient name: Albert [name] MRN: [id number] DOB: Feb 19, 1961 Sex: male  12/07/2012 - 12/13/2012 Pre-operative Diagnosis: Ischemic left foot, status post multiple toe amputations Post-operative diagnosis:  Same Surgeon:  Jorge Ny Assistants:  Doreatha Massed Procedure:   Left below knee popliteal artery to posterior tibial artery bypass graft with ipsilateral reverse greater saphenous vein Anesthesia:  Gen. Blood Loss:  See anesthesia record Specimens:  None  Findings:  Adequate below knee popliteal artery with minimal disease. The posterior tibial artery was mildly calcified. I was able to pass a 1.5 mm dilator out onto the foot without difficulty. I harvested the vein from the knee to the ankle. This appeared to be an excellent vein. When I took it out it was somewhat thickened and sclerotic however it did appear to be a suitable conduit.  Indications:  The patient initially presented with an ischemic toe. He was taken to the operating room by orthopedics for toe amputation. There was no bleeding noted at the time of his operation. He has subsequently required repeat amputation and debridement because of inadequate circulation. The previously had an arteriogram which showed occlusion of all 3 tibial vessels with reconstitution of the posterior tibial artery at the ankle. He comes in today for bypass graft. I discussed the risks and benefits of the procedure with him including the risk of limb loss and graft failure. All his questions were answered and he wished to proceed.  Procedure:  The patient was identified in the holding area and taken to [facility] OR ROOM 11  The patient was then placed supine on the table. general anesthesia was administered.  The patient was prepped and draped in the usual sterile fashion.  A time out was called and antibiotics were administered.  Ultrasound was used to map the course of the saphenous vein from the ankle to  the mid thigh. The vein appeared to be of adequate diameter, approximately 4 mm. It was easily compressible. There was only mild thickening by ultrasound today. I initially began by exposing the posterior tibial artery at the ankle. A longitudinal incision was made posterior to the medial malleolus. Cautery was used to divide the subcutaneous tissue as well as the fascia. The neurovascular bundle was identified. I exposed the posterior tibial artery. It was calcified proximally but at the level of the ankle seemed reasonable. Next, I made an incision below the knee on the medial side. The vein was harvested through this incision. Once it was fully mobilized it was protected and then I proceeded with exposure of the below knee popliteal artery. Once the popliteal space was entered the popliteal vein was reflected posteriorly and I exposed the below knee popliteal artery. There was a good pulse within the artery. It appeared to be very soft. I then proceeded with harvesting the remaining portion of the vein between the 2 incisions. The vein appeared to be adequate. All side branches were ligated between 3-0 silk ties and metal clips. Once I felt I had adequate vein harvested, the distal end was ligated with a 3-0 silk tie as was the proximal end. The vein was placed on the back table. I then created a subfascial tunnel between the 2 incisions. The patient was fully heparinized. I then prepared the vein on the back table. The vein distended nicely however it did appear to be mildly thickened. The distal part of the vein was bigger than the proximal end and so I elected to  place this vein in a reversed fashion. I placed a tourniquet on the upper thigh. The Esmarch was used to exsanguinate the leg. The tourniquet was taken to 250 mm of pressure. Next a #11 blade was used to make an arteriotomy in the below knee popliteal artery. This was extended longitudinally with Potts scissors. The vein was then spatulated to fit the  size of the arteriotomy. A running anastomosis was created with 6-0 Prolene. Prior to completion the appropriate flushing maneuvers were performed. The anastomosis was completed and the tourniquet was let down. There was pulsatile flow through the vein however there did appear to be some sclerotic areas. I passed a #3, #3-1/2, and 4 dilators retrograde through the vein. This improved pulsatility and blood flow through the vein. Next, the vein was brought through the previously created tunnel, making sure to maintain proper orientation. I then exsanguinated the leg with an Esmarch and the tourniquet was taken back to 250 mm of pressure. I made a arteriotomy with a #11 blade on the posterior tibial artery at the level of ankle. This was extended longitudinally with Potts scissors. The vein was cut to the appropriate length and spatulated to fit the size of the arteriotomy. A running anastomosis was then created in a end-to-side fashion with 6-0 Prolene. Prior to completion, the tourniquet was let down the profundus maneuvers were performed and the anastomosis was completed. There was a Doppler dependent signal within the posterior tibial artery. I was a little concerned about the thickening of the vein, and therefore I have recommended keeping the patient on heparin. I did not reverse heparin in the operating room. Once hemostasis was satisfactory the below knee incision was closed by reapproximating the fascia with a running 2-0 Vicryl in the subcutaneous tissue with 2 layers of 3-0 Vicryl and a layer 4-0 Vicryl the skin. Dermabond placed. The vein harvest incisions were closed with 2 layers of 3-0 Vicryl. The posterior tibial artery exposure was closed with 2 layers of 3-0 Vicryl and Dermabond. There were no complications.   Disposition:  To PACU in stable condition.   Juleen China, M.D. [facility] [place] Office: [phone] Pager:  [phone]

## 2012-12-13 NOTE — Transfer of Care (Signed)
Immediate Anesthesia Transfer of Care Note  Patient: Albert [name]  Procedure(s) Performed: Procedure(s) with comments: BYPASS GRAFT POPLITEAL TO TIBIAL (Left) - Left Popliteal to Posterior Tibial Bypass Graft with reversed saphenous vein graft  Patient Location: PACU  Anesthesia Type:General  Level of Consciousness: awake, alert  and oriented  Airway & Oxygen Therapy: Patient Spontanous Breathing and Patient connected to nasal cannula oxygen  Post-op Assessment: Report given to PACU RN and Post -op Vital signs reviewed and stable  Post vital signs: Reviewed and stable  Complications: No apparent anesthesia complications

## 2012-12-14 DIAGNOSIS — Z48812 Encounter for surgical aftercare following surgery on the circulatory system: Secondary | ICD-10-CM

## 2012-12-14 DIAGNOSIS — I96 Gangrene, not elsewhere classified: Secondary | ICD-10-CM

## 2012-12-14 LAB — GLUCOSE, CAPILLARY
Glucose-Capillary: 234 mg/dL — ABNORMAL HIGH (ref 70–99)
Glucose-Capillary: 267 mg/dL — ABNORMAL HIGH (ref 70–99)
Glucose-Capillary: 322 mg/dL — ABNORMAL HIGH (ref 70–99)

## 2012-12-14 LAB — BASIC METABOLIC PANEL
BUN: 11 mg/dL (ref 6–23)
Calcium: 8.2 mg/dL — ABNORMAL LOW (ref 8.4–10.5)
Calcium: 8.5 mg/dL (ref 8.4–10.5)
Creatinine, Ser: 0.79 mg/dL (ref 0.50–1.35)
GFR calc Af Amer: >90 mL/min (ref >90)
GFR calc Af Amer: >90 mL/min (ref >90)
GFR calc non Af Amer: >90 mL/min (ref >90)
GFR calc non Af Amer: >90 mL/min (ref >90)
Glucose, Bld: 295 mg/dL — ABNORMAL HIGH (ref 70–99)
Potassium: 4.3 mEq/L (ref 3.5–5.1)
Sodium: 129 mEq/L — ABNORMAL LOW (ref 135–145)

## 2012-12-14 LAB — APTT: aPTT: 32 seconds (ref 24–37)

## 2012-12-14 MED ORDER — INSULIN ASPART 100 UNIT/ML ~~LOC~~ SOLN
0.0000 [IU] | Freq: Three times a day (TID) | SUBCUTANEOUS | Status: DC
  Administered 2012-12-14 (×2): 20 [IU] via SUBCUTANEOUS
  Administered 2012-12-15: 15 [IU] via SUBCUTANEOUS

## 2012-12-14 MED ORDER — IRBESARTAN 300 MG PO TABS
300.0000 mg | ORAL_TABLET | Freq: Every day | ORAL | Status: DC
  Administered 2012-12-14 – 2012-12-18 (×5): 300 mg via ORAL
  Filled 2012-12-14 (×6): qty 1

## 2012-12-14 NOTE — Progress Notes (Signed)
FMTS Daily Intern Progress Note: [id number]  Subjective:  POD 6 left 3rd and 5th ray amputation. POD # 3 for 2nd ray amputation. POD #1 for lower extremity bypass on left  Pain controled  No N/V/D, no CP, SOB  I have reviewed the patient's medications.  Objective Temp:  [97.8 F (36.6 C)-99.4 F (37.4 C)] 99.4 F (37.4 C) (02/28 0737) Pulse Rate:  [80-107] 86 (02/28 0800) Resp:  [7-22] 19 (02/28 0800) BP: (135-170)/(81-96) 159/84 mmHg (02/28 0737) SpO2:  [94 %-99 %] 94 % (02/28 0800) FiO2 (%):  [100 %] 100 % (02/27 1214) Weight:  [251 lb 5.2 oz (114 kg)] 251 lb 5.2 oz (114 kg) (02/27 1430)   Intake/Output Summary (Last 24 hours) at 12/14/12 0943 Last data filed at 12/14/12 0839  Gross per 24 hour  Intake   2130 ml  Output   3740 ml  Net  -1610 ml    CBG (last 3)   Recent Labs  12/13/12 1647 12/13/12 2145 12/14/12 0830  GLUCAP 229* 262* 221*    General: no acute distress HEENT: moist mucous membranes, PERRLA, EOMI CV: S1S2, RRR, no murmur Pulm: cta b/l Ext: dressing clean and dry, + medial incision intact without erythema along calf Left Side Neuro: no focal deficits.   Labs and Imaging  Recent Labs Lab 12/11/12 0615 12/13/12 1613 12/13/12 2308  WBC 13.1* 13.7* 12.0*  HGB 11.2* 11.1* 10.8*  HCT 33.2* 33.4* 31.2*  PLT 326 341 366     Recent Labs Lab 12/13/12 1628 12/13/12 2308 12/14/12 0415  NA 130* 128* 129*  K 4.7 4.3 4.2  CL 93* 93* 94*  CO2 27 27 27   BUN 13 11 11   CREATININE 0.90 0.81 0.79  GLUCOSE 241* 295* 252*  CALCIUM 8.8 8.2* 8.5   ECHO 12/12/12 - EF 50-55%, grade one diastolic HF   ABI: >1.0 on right, 0.85 on left.   Assessment and Plan 52 y.o. 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 Poor blood flow likely due to combination of microvascular and large vessel disease disease 2o/2 poorly controlled DM and likely atherosclerosis given hx of prior  CVA  # left 3rd and 5th toe necrosis: POD # 6 s/p 3rd and 5th toe ray amputation. POD #3 2nd ray amputation  - continue pain control - has been afebrile and in NSR for > 24 hrs.   -PT recommends home health PT with walker.  Will order for home use  -Ortho has done wound changes and recommends f/u in outpatient setting   # peripheral vascular disease: evaluated by vascular surgery.  - Pt went for aortogram showing Posterior tibial artery occluded on the right and left  - Vascular surgery performed bypass on left side on 12/13/12  Greatly appreciate recommendations   - Cardiology cleared patient for pre-op.  Have signed off  -PT/OT consults s/p surgery on 2/27   # HTN: on metoprolol 50mg  bid. Home irbesartan/HCTZ 300/12.5mg  held initially.  Increased Metoprolol to 100 mg BID and will restart ARB 24 hrs s/p surgery today.    # Acute renal failure: most recent cr: 0.66 in 2012.  - Creatinine stable - Now Resolved   # DM: A1C: 9.0. On levemir 20qhs at home with novlolg 12 tid with meals and glipizide/metformin - Custom SSI increased from 0-30 and also Lantus 40 U yesterday.   #Hyponatremia - Corrected for high sugars  is actually pseudohyponatremia.  Will continue to monitor daily   FEN: carb modified PPx: heparin drip per vascular  Dispo: Pending vascular recommendations  Takeisha Cianci R. Paulina Fusi, DO of [facility] 12/14/2012, 9:43 AM

## 2012-12-14 NOTE — Progress Notes (Signed)
Received to floor. Assessed per flowsheet; no changes noted. C/o left leg pain. Pain medicine given per order. Call bell near. Albert [name]

## 2012-12-14 NOTE — Progress Notes (Signed)
FMTS Daily Intern Progress Note: [id number]  Subjective:  POD 6 left 3rd and 5th ray amputation. POD # 3 for 2nd ray amputation. POD #1 for lower extremity bypass on left  Pain controled  No N/V/D, no CP, SOB  I have reviewed the patient's medications.  Objective Temp:  [97.8 F (36.6 C)-99.4 F (37.4 C)] 99.4 F (37.4 C) (02/28 0737) Pulse Rate:  [80-107] 80 (02/28 0330) Resp:  [7-22] 17 (02/28 0330) BP: (135-170)/(81-96) 135/83 mmHg (02/28 0330) SpO2:  [94 %-99 %] 95 % (02/28 0330) FiO2 (%):  [100 %] 100 % (02/27 1214) Weight:  [251 lb 5.2 oz (114 kg)] 251 lb 5.2 oz (114 kg) (02/27 1430)   Intake/Output Summary (Last 24 hours) at 12/14/12 0828 Last data filed at 12/14/12 0737  Gross per 24 hour  Intake   2930 ml  Output   3790 ml  Net   -860 ml    CBG (last 3)   Recent Labs  12/13/12 1210 12/13/12 1647 12/13/12 2145  GLUCAP 214* 229* 262*    General: no acute distress HEENT: moist mucous membranes, PERRLA, EOMI CV: S1S2, RRR, no murmur Pulm: cta b/l Ext: dressing clean and dry, + medial incision intact without erythema along calf Left Side Neuro: no focal deficits.   Labs and Imaging  Recent Labs Lab 12/11/12 0615 12/13/12 1613 12/13/12 2308  WBC 13.1* 13.7* 12.0*  HGB 11.2* 11.1* 10.8*  HCT 33.2* 33.4* 31.2*  PLT 326 341 366     Recent Labs Lab 12/13/12 1628 12/13/12 2308 12/14/12 0415  NA 130* 128* 129*  K 4.7 4.3 4.2  CL 93* 93* 94*  CO2 27 27 27   BUN 13 11 11   CREATININE 0.90 0.81 0.79  GLUCOSE 241* 295* 252*  CALCIUM 8.8 8.2* 8.5   ECHO 12/12/12 - EF 50-55%, grade one diastolic HF   ABI: >1.0 on right, 0.85 on left.   Assessment and Plan 52 y.o. 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 Poor blood flow likely due to combination of microvascular and large vessel disease disease 2o/2 poorly controlled DM and likely atherosclerosis given hx of prior  CVA  # left 3rd and 5th toe necrosis: POD # 6 s/p 3rd and 5th toe ray amputation. POD #3 2nd ray amputation  - continue pain control - has been afebrile and in NSR for > 24 hrs.   -PT recommends home health PT with walker.  Will order for home use  -Ortho has done wound changes and recommends f/u in outpatient setting   # peripheral vascular disease: evaluated by vascular surgery.  - Pt went for aortogram showing Posterior tibial artery occluded on the right and left  - Vascular surgery performed bypass on left side on 12/13/12  Greatly appreciate recommendations   - Cardiology cleared patient for pre-op.  Have signed off  -PT/OT consults s/p surgery on 2/27   -Pt on heparin drip, will transition off today.   # HTN: on metoprolol 50mg  bid. Home irbesartan/HCTZ 300/12.5mg  held initially.  Increased Metoprolol to 100 mg BID and will restart ARB 24 hrs s/p surgery today.    # Acute renal failure: most recent cr: 0.66 in 2012.  - Creatinine stable - Now Resolved   # DM: A1C: 9.0. On levemir 20qhs at home with novlolg 12 tid with meals and glipizide/metformin - Custom SSI increased from 0-30 and also Lantus  40 U yesterday.   #Hyponatremia - Corrected for high sugars is actually pseudohyponatremia.  Will continue to monitor daily   FEN: carb modified PPx: heparin drip per vascular  Dispo: Pending vascular recommendations  Albert [name] R. Paulina Fusi, DO of [facility] 12/14/2012, 8:28 AM

## 2012-12-14 NOTE — Evaluation (Signed)
Occupational Therapy Evaluation Patient Details Name: Albert [name] MRN: [id number] DOB: 1960-12-16 Today's Date: 12/14/2012 Time: 1308-6578 OT Time Calculation (min): 28 min  OT Assessment / Plan / Recommendation Clinical Impression  Pt is a 52 yr old male with recent 4th digit amputation on the LLE.  This hospitalization underwent amputation of digits 2-5 and with left fem-pop bypass grafting.  Pt overall at a mod assist level for simulated selfcare tasks and functional transfers will benefit from acute care OT services to help increase overall independence for return home with 24 hour supervision/assist from family.  Feel pt will benefit from [facility] safety eval after discharge.    OT Assessment  Patient needs continued OT Services    Follow Up Recommendations  Home health OT    Barriers to Discharge None    Equipment Recommendations  3 in 1 bedside comode       Frequency  Min 2X/week    Precautions / Restrictions Precautions Precautions: Fall Precaution Comments: darco shoe on the left foot for mobility Required Braces or Orthoses: Other Brace/Splint Other Brace/Splint: Darco Heel wedge Restrictions Weight Bearing Restrictions: Yes LLE Weight Bearing: Partial weight bearing Other Position/Activity Restrictions: able to put weight through heel   Pertinent Vitals/Pain Vitals stable during session    ADL  Eating/Feeding: Simulated;Modified independent Where Assessed - Eating/Feeding: Chair Grooming: Simulated;Supervision/safety Where Assessed - Grooming: Unsupported sitting Upper Body Bathing: Simulated;Set up Where Assessed - Upper Body Bathing: Unsupported sitting Lower Body Bathing: Simulated;Moderate assistance Where Assessed - Lower Body Bathing: Supported sit to stand Upper Body Dressing: Simulated;Set up Where Assessed - Upper Body Dressing: Unsupported sitting Lower Body Dressing: Simulated;Moderate assistance Where Assessed - Lower Body Dressing: Supported sit  to Pharmacist, hospital: Simulated;Moderate assistance Toilet Transfer Method: Other (comment);Stand pivot Toilet Transfer Equipment:  (simulated to bedside chair) Toileting - Clothing Manipulation and Hygiene: Simulated;Moderate assistance (bedside chair) Where Assessed - Toileting Clothing Manipulation and Hygiene: Other (comment) (sit to stand at bedside chair) Tub/Shower Transfer Method: Not assessed Equipment Used: Rolling walker;Other (comment) (Darko shoe) Transfers/Ambulation Related to ADLs: Pt min assist for mobility with use of RW and Darko shoe, but moves slowly secondary to pain at this time. ADL Comments: Pt with history of right CVA with left hemiparesis (mostly in the hand).  Reports having his family assist with tying his shoes.  Uses the LUE for handwritting now and for eating.  Decreased ability to reach his left or right foot for donning sock or darko shoe.    OT Diagnosis: Generalized weakness;Acute pain  OT Problem List: Decreased strength;Impaired balance (sitting and/or standing);Pain;Decreased knowledge of use of DME or AE OT Treatment Interventions: Self-care/ADL training;Therapeutic activities;DME and/or AE instruction;Balance training   OT Goals Acute Rehab OT Goals OT Goal Formulation: With patient ADL Goals Pt Will Perform Grooming: with supervision;Standing at sink;Other (comment) (3 tasks) Pt Will Perform Lower Body Bathing: with supervision;with adaptive equipment;Sit to stand from bed ADL Goal: Lower Body Bathing - Progress: Goal set today Pt Will Perform Lower Body Dressing: with supervision;Sit to stand from bed;with adaptive equipment ADL Goal: Lower Body Dressing - Progress: Goal set today Pt Will Transfer to Toilet: with supervision;with DME;3-in-1;Maintaining weight bearing status ADL Goal: Toilet Transfer - Progress: Goal set today Miscellaneous OT Goals Miscellaneous OT Goal #1: Pt will voice understanding of AE available to compensate for limited  FM capabilities when tying or buttoning. OT Goal: Miscellaneous Goal #1 - Progress: Goal set today  Visit Information  Last OT Received On: 12/14/12  Assistance Needed: +1 PT/OT Co-Evaluation/Treatment: Yes    Subjective Data  Subjective: My leg is really killing me. Patient Stated Goal: Pt did not state specifically.   Prior Functioning     Home Living Lives With: Spouse Type of Home: House Home Access: Level entry Home Layout: Two level Alternate Level Stairs-Number of Steps: 1 step between living areas Alternate Level Stairs-Rails: None Bathroom Toilet: Standard Home Adaptive Equipment: None Additional Comments: Pt reports he cane acquire SPC. Prior Function Level of Independence: Independent Driving: No Vocation: Full time employment Communication Communication: No difficulties         Vision/Perception Vision - History Baseline Vision: No visual deficits Patient Visual Report: No change from baseline Vision - Assessment Vision Assessment: Vision not tested Perception Perception: Within Functional Limits Praxis Praxis: Intact   Cognition  Cognition Overall Cognitive Status: Appears within functional limits for tasks assessed/performed Arousal/Alertness: Awake/alert Orientation Level: Appears intact for tasks assessed Behavior During Session: [facility] for tasks performed    Extremity/Trunk Assessment Right Upper Extremity Assessment RUE ROM/Strength/Tone: Deficits RUE ROM/Strength/Tone Deficits:  Gross grasp and release present.  Elbow and shoulder AROM and strength WFLS. RUE Sensation: WFL - Light Touch RUE Coordination: Deficits RUE Coordination Deficits: R hand deficits since CVA.  Decreased FM capabilities for fastening and buttoning. Trunk Assessment Trunk Assessment: Other exceptions Trunk Exceptions: Pt with flexed trunk in standing could be due secondary to pain.     Mobility Bed Mobility Bed Mobility: Supine to Sit Supine to Sit: 4: Min  guard;With rails Transfers Transfers: Sit to Stand Sit to Stand: 3: Mod assist;From bed;With upper extremity assist Stand to Sit: 4: Min assist;To elevated surface;To chair/3-in-1;With upper extremity assist Details for Transfer Assistance: Pt needs mod instructional cueing for hand placement with sit to stand.        Balance Balance Balance Assessed: Yes Dynamic Standing Balance Dynamic Standing - Balance Support: Right upper extremity supported;Left upper extremity supported Dynamic Standing - Level of Assistance: 4: Min assist   End of Session OT - End of Session Activity Tolerance: Patient limited by pain Patient left: in chair;with nursing in room;Other (comment) (Pt left in wheelchair for transfer to another room.) Nurse Communication: Mobility status     Inger Wiest OTR/L Pager number [phone] 12/14/2012, 1:28 PM

## 2012-12-14 NOTE — Progress Notes (Signed)
I agree with the above. The patient has a briskly dopplerable signal within the bypass graft as well as in the distal posterior tibial artery. He continues to have his dressings from his amputation site in place. There has been mild amount of oozing from the distal incision.  Him and continue the patient on heparin drip over the weekend. I will more than likely send him home with anticoagulation for approximately 3 months or at least until I get a duplex of his bypass graft.  Durene Cal

## 2012-12-14 NOTE — Progress Notes (Signed)
VASCULAR LAB PRELIMINARY  ARTERIAL  ABI completed:    RIGHT    LEFT    PRESSURE WAVEFORM  PRESSURE WAVEFORM  BRACHIAL 159 T BRACHIAL 158 T  DP   DP    AT 152 DM AT 103 M  PT 159 M PT 154 M  PER   PER    GREAT TOE  NA GREAT TOE  NA    RIGHT LEFT  ABI 1 0.97     Bertie Mcconathy, RVT 12/14/2012, 5:12 PM

## 2012-12-14 NOTE — Evaluation (Signed)
Physical Therapy Re-Evaluation Patient Details Name: Albert [name] MRN: [id number] DOB: 06/16/1961 Today's Date: 12/14/2012 Time: 3086-5784 PT Time Calculation (min): 25 min  PT Assessment / Plan / Recommendation Clinical Impression  52 y.o. male s/p left 3rd and 5th ray amputation who is now POD #1 BPG in left lower leg.  He is PWB through heel only on left leg in [facility] shoe. He is mobilizing well, limited by post-op pain.  He will be able to go home with intermittent supervision and HHPT at discharge.      PT Assessment  Patient needs continued PT services    Follow Up Recommendations  Home health PT    Does the patient have the potential to tolerate intense rehabilitation    NA  Barriers to Discharge None none    Equipment Recommendations  Rolling walker with 5" wheels    Recommendations for Other Services   none  Frequency Min 4X/week    Precautions / Restrictions Precautions Precautions: Fall Precaution Comments: darco shoe on the left foot for mobility Required Braces or Orthoses: Other Brace/Splint Other Brace/Splint: Darco Heel wedge Restrictions Weight Bearing Restrictions: Yes LLE Weight Bearing: Partial weight bearing Other Position/Activity Restrictions: able to put weight through heel   Pertinent Vitals/Pain Reports 9-10/10 pain with mobility/walking.  Relieved once seated.        Mobility  Bed Mobility Bed Mobility: Supine to Sit Supine to Sit: 4: Min guard;With rails Transfers Sit to Stand: 3: Mod assist;From bed;With upper extremity assist Stand to Sit: 4: Min assist;To elevated surface;To chair/3-in-1;With upper extremity assist Details for Transfer Assistance: Pt needs mod instructional cueing for hand placement with sit to stand. Ambulation/Gait Ambulation/Gait Assistance: 4: Min guard Ambulation Distance (Feet): 45 Feet Assistive device: Rolling walker;Other (Comment) (right DARCO shoe) Ambulation/Gait Assistance Details: min guard assist  for safety.  Pt with very antalgic gait complaining of intense pain with gait.  Cues to keep weight on heel of left foot.   Gait Pattern: Step-to pattern;Antalgic;Trunk flexed Gait velocity: less than 1.8 ft/sec which indicates risk for recurrent falls    Exercises  encouraged elevation of leg above the level of his heart and ankle pumps 10 times every hour   PT Diagnosis: Difficulty walking;Acute pain;Abnormality of gait;Generalized weakness  PT Problem List: Decreased strength;Decreased range of motion;Decreased activity tolerance;Decreased balance;Decreased mobility;Decreased knowledge of use of DME;Pain PT Treatment Interventions: DME instruction;Gait training;Functional mobility training;Therapeutic activities;Therapeutic exercise;Patient/family education   PT Goals Acute Rehab PT Goals PT Goal Formulation: With patient Time For Goal Achievement: 12/28/12 Potential to Achieve Goals: Good Pt will go Supine/Side to Sit: with modified independence;with HOB 0 degrees PT Goal: Supine/Side to Sit - Progress: Goal set today Pt will go Sit to Supine/Side: with modified independence;with HOB 0 degrees PT Goal: Sit to Supine/Side - Progress: Goal set today Pt will go Sit to Stand: with modified independence PT Goal: Sit to Stand - Progress: Goal set today Pt will go Stand to Sit: with modified independence PT Goal: Stand to Sit - Progress: Goal set today Pt will Transfer Bed to Chair/Chair to Bed: with modified independence PT Transfer Goal: Bed to Chair/Chair to Bed - Progress: Goal set today Pt will Ambulate: 16 - 50 feet;with modified independence;with rolling walker PT Goal: Ambulate - Progress: Goal set today Pt will Go Up / Down Stairs: 1-2 stairs;with modified independence;with rolling walker PT Goal: Up/Down Stairs - Progress: Goal set today Pt will Perform Home Exercise Program: Other (comment) (d/c goal from earlier assessment) PT  Goal: Perform Home Exercise Program - Progress:  Discontinued (comment) (from earlier assessment)  Visit Information  Last PT Received On: 12/14/12 Assistance Needed: +1 PT/OT Co-Evaluation/Treatment: Yes    Subjective Data  Subjective: Pt is grumbling loudly due to pain throughout treatment session Patient Stated Goal: to go home   Prior Functioning  Home Living Lives With: Spouse Available Help at Discharge: Available PRN/intermittently (wife works 3rd shift (3 12 hour shifts), teenage sons can he) Type of Home: House Home Access: Level entry Home Layout: Two level Alternate Level Stairs-Number of Steps: 1 step between living areas Alternate Level Stairs-Rails: None Bathroom Toilet: Standard Home Adaptive Equipment: None Additional Comments: Pt reports he cane acquire SPC. Prior Function Level of Independence: Independent Driving: No Vocation: Full time employment Comments: [employer] Communication Communication: No difficulties    Cognition  Cognition Overall Cognitive Status: Appears within functional limits for tasks assessed/performed Arousal/Alertness: Awake/alert Orientation Level: Appears intact for tasks assessed Behavior During Session: [facility] for tasks performed    Extremity/Trunk Assessment Right Upper Extremity Assessment RUE ROM/Strength/Tone: Deficits RUE ROM/Strength/Tone Deficits:  Gross grasp and release present.  Elbow and shoulder AROM and strength WFLS. RUE Sensation: WFL - Light Touch RUE Coordination: Deficits RUE Coordination Deficits: R hand deficits since CVA.  Decreased FM capabilities for fastening and buttoning. Right Lower Extremity Assessment RLE ROM/Strength/Tone: [facility] for tasks assessed Left Lower Extremity Assessment LLE ROM/Strength/Tone: Deficits LLE ROM/Strength/Tone Deficits: limited ankle PF/DF due to pain, 3-/5, knee ext 3/5, hipf flexion 3-/5 Trunk Assessment Trunk Assessment: Other exceptions Trunk Exceptions: Pt with flexed trunk in standing could be due secondary to pain.    Balance Balance Balance Assessed: Yes Static Standing Balance Static Standing - Balance Support: Bilateral upper extremity supported Static Standing - Level of Assistance: 4: Min assist Static Standing - Comment/# of Minutes: min guard assist for one minute while preparing to walk Dynamic Standing Balance Dynamic Standing - Balance Support: Right upper extremity supported;Left upper extremity supported Dynamic Standing - Level of Assistance: 4: Min assist  End of Session PT - End of Session Activity Tolerance: Patient limited by fatigue;Patient limited by pain Patient left: in chair;with nursing in room Nurse Communication: Mobility status       Lurena Joiner B. Durenda Pechacek, PT, DPT [phone]   12/14/2012, 2:15 PM

## 2012-12-14 NOTE — Progress Notes (Signed)
Inpatient Diabetes Program Recommendations  AACE/ADA: New Consensus Statement on Inpatient Glycemic Control (2013)  Target Ranges:  Prepandial:   less than 140 mg/dL      Peak postprandial:   less than 180 mg/dL (1-2 hours)      Critically ill patients:  140 - 180 mg/dL  Results for Albert [name], Albert [name] (MRN [id number]) as of 12/14/2012 12:46  Ref. Range 12/13/2012 12:10 12/13/2012 16:47 12/13/2012 21:45 12/14/2012 08:30 12/14/2012 10:47  Glucose-Capillary Latest Range: 70-99 mg/dL 782 (H) 956 (H) 213 (H) 221 (H) 258 (H)   Inpatient Diabetes Program Recommendations Insulin - Basal: Increase Lantus to 40 units at HS Correction (SSI): xx Insulin - Meal Coverage: xx Oral Agents: xx HgbA1C: Hgb A1C is 9.0 indicating sub-optimal glycemic control with mean glucose of approximately 212 mg/dl. Outpatient Referral: xx Diet: xx Thank you  Piedad Climes BSN, RN,CDE Inpatient Diabetes Coordinator [phone] (team pager)

## 2012-12-14 NOTE — Progress Notes (Signed)
Pt to be TX to 2034, VSS, called report.

## 2012-12-14 NOTE — Progress Notes (Signed)
Patient ID: Albert [name], male   DOB: 17-Aug-1961, 52 y.o.   MRN: [id number] I changed [name] left foot dressing at the bedside.  The distal aspect looks stable and his foot is warm and pink.  New dressing applied.  I don't anticipate any further surgery on his foot during this hospitalization.  Will leave current dressing in place for the next few days.  Can put weight thru left heel.

## 2012-12-14 NOTE — Progress Notes (Signed)
Family Medicine Teaching Service Attending Note  I discussed patient Rasp  with Dr. Hess and reviewed their note for today.  I agree with their assessment and plan.      

## 2012-12-14 NOTE — Progress Notes (Signed)
[facility] Progress Note  12/14/2012 7:20 AM POD 1  Subjective:  Sore this am  Afebrile x 24 hrs VSS 95% RA  GTTS:  Heparin gtt  Filed Vitals:   12/14/12 0330  BP: 135/83  Pulse: 80  Temp: 98.3 F (36.8 C)  Resp: 17    Physical Exam: Incisions:  C/d/i .  There is some minimal bloody drainage over the distal incision where the distal anastomosis is. Extremities:  + doppler flow in graft; left foot dressing is dry and in tact (re dressed by Dr. Magnus Ivan this am)  CBC    Component Value Date/Time   WBC 12.0* 12/13/2012 2308   RBC 3.95* 12/13/2012 2308   HGB 10.8* 12/13/2012 2308   HCT 31.2* 12/13/2012 2308   PLT 366 12/13/2012 2308   MCV 79.0 12/13/2012 2308   MCH 27.3 12/13/2012 2308   MCHC 34.6 12/13/2012 2308   RDW 13.1 12/13/2012 2308   LYMPHSABS 2.2 12/11/2012 0615   MONOABS 1.2* 12/11/2012 0615   EOSABS 0.1 12/11/2012 0615   BASOSABS 0.1 12/11/2012 0615    BMET    Component Value Date/Time   NA 129* 12/14/2012 0415   K 4.2 12/14/2012 0415   CL 94* 12/14/2012 0415   CO2 27 12/14/2012 0415   GLUCOSE 252* 12/14/2012 0415   BUN 11 12/14/2012 0415   CREATININE 0.79 12/14/2012 0415   CALCIUM 8.5 12/14/2012 0415   GFRNONAA >90 12/14/2012 0415   GFRAA >90 12/14/2012 0415    INR No results found for this basename: inr     Intake/Output Summary (Last 24 hours) at 12/14/12 0720 Last data filed at 12/14/12 0300  Gross per 24 hour  Intake   2930 ml  Output   3540 ml  Net   -610 ml     Assessment/Plan:  52 y.o. male is s/p:  Left below knee popliteal artery to posterior tibial artery bypass graft with ipsilateral reverse greater saphenous vein   POD 1  -bypass graft is patent -continue heparin today -legs need to be elevated to help with swelling -transfer to 2000 -d/c IVF CBG 250-appreciate DM coordinator's help with management  Doreatha Massed, PA-C [facility] [phone] 12/14/2012 7:20 AM

## 2012-12-14 NOTE — Progress Notes (Addendum)
Patient is doing well post-op.  No chest pain or dyspnea. Heart reveals no gallop, murmur, click or rub.  Lungs clear. Review of telemetry shows NSR and no significant arrhythmias. Will sign off now. Please call for questions.

## 2012-12-14 NOTE — Progress Notes (Signed)
Family Medicine Teaching Service Attending Note  I discussed patient Albert [name]  with Dr. Hess and reviewed their note for today.  I agree with their assessment and plan.      

## 2012-12-14 NOTE — ED Provider Notes (Signed)
Medical screening examination/treatment/procedure(s) were conducted as a shared visit with non-physician practitioner(s) and myself.  I personally evaluated the patient during the encounter  Doug Sou, MD 12/14/12 [phone]

## 2012-12-14 NOTE — Care Management Note (Signed)
    Page 1 of 2   12/14/2012     10:58:33 AM   CARE MANAGEMENT NOTE 12/14/2012  Patient:  Albert [name], Albert [name]   Account Number:  [id number]  Date Initiated:  12/10/2012  Documentation initiated by:  Darlyne Russian  Subjective/Objective Assessment:   Patient s/p toe amputation one month prior, seen 2 days prior to admission for  debridement.     Action/Plan:   Progression of care and discharge planning   Anticipated DC Date:  12/17/2012   Anticipated DC Plan:  HOME W HOME HEALTH SERVICES      DC Planning Services  CM consult      PAC Choice  DURABLE MEDICAL EQUIPMENT  HOME HEALTH   Choice offered to / List presented to:  C-1 Patient   DME arranged  3-N-1  Levan Hurst      DME agency  [facility]     [facility] arranged  HH-1 RN  HH-2 PT      [facility] agency  [facility]   Status of service:  In process, will continue to follow Medicare Important Message given?   (If response is "NO", the following Medicare IM given date fields will be blank) Date Medicare IM given:   Date Additional Medicare IM given:    Discharge Disposition:    Per UR Regulation:  Reviewed for med. necessity/level of care/duration of stay  If discussed at Long Length of Stay Meetings, dates discussed:    Comments:  12/14/12- 1045- Donn Pierini RN, BSN [phone] Order for HH-PT - spoke with pt at bedside-per conversation pt has had HH with [facility] in past and would like to continue with them at discharge- will ask MD to add RN to order and also a 3n1 for home (paged MD for order)- per pt he has a RW available to use at home. Referral called to Lupita Leash with [facility] for [facility] PT/RN- per Lupita Leash pt is already active with [facility] for HH-RN- added PT services. Pt to tx out of SDU today.  12/13/12- 1530- Donn Pierini RN, BSN- [phone] pt s/p BYPASS GRAFT POPLITEAL TO TIBIAL (Left) - Left Popliteal to Posterior Tibial Bypass Graft -   pt lives at home with spouse- will need PT/OT evals when medically  stable. NCM to follow for potential d/c needs.     12/10/2012  Darlyne Russian RN, CCM  [phone]  12/08/2012 AMPUTATION DIGIT (Left) - 3rd toe

## 2012-12-15 LAB — GLUCOSE, CAPILLARY

## 2012-12-15 LAB — BASIC METABOLIC PANEL
CO2: 31 mEq/L (ref 19–32)
Calcium: 8.6 mg/dL (ref 8.4–10.5)
Glucose, Bld: 237 mg/dL — ABNORMAL HIGH (ref 70–99)
Potassium: 4.5 mEq/L (ref 3.5–5.1)
Sodium: 131 mEq/L — ABNORMAL LOW (ref 135–145)

## 2012-12-15 LAB — APTT: aPTT: 35 seconds (ref 24–37)

## 2012-12-15 LAB — HEPARIN LEVEL (UNFRACTIONATED): Heparin Unfractionated: <0.1 IU/mL — ABNORMAL LOW (ref 0.30–0.70)

## 2012-12-15 MED ORDER — INSULIN ASPART 100 UNIT/ML ~~LOC~~ SOLN
0.0000 [IU] | Freq: Three times a day (TID) | SUBCUTANEOUS | Status: DC
  Administered 2012-12-15: 20 [IU] via SUBCUTANEOUS
  Administered 2012-12-15 – 2012-12-16 (×3): 15 [IU] via SUBCUTANEOUS
  Administered 2012-12-16: 20 [IU] via SUBCUTANEOUS
  Administered 2012-12-17 – 2012-12-18 (×3): 15 [IU] via SUBCUTANEOUS
  Administered 2012-12-18: 20 [IU] via SUBCUTANEOUS

## 2012-12-15 NOTE — Progress Notes (Signed)
Physical Therapy Treatment Patient Details Name: Albert [name] MRN: [id number] DOB: [birth year] Today's Date: 12/15/2012 Time: 4098-1191 PT Time Calculation (min): 24 min  PT Assessment / Plan / Recommendation Comments on Treatment Session  Improvements with ambulation.  Agree with need for f/u PT.    Follow Up Recommendations  Home health PT;Supervision/Assistance - 24 hour     Does the patient have the potential to tolerate intense rehabilitation     Barriers to Discharge        Equipment Recommendations  Rolling walker with 5" wheels    Recommendations for Other Services    Frequency Min 4X/week   Plan Discharge plan remains appropriate;Frequency remains appropriate    Precautions / Restrictions Precautions Precautions: Fall Required Braces or Orthoses: Other Brace/Splint Other Brace/Splint: Darco boot Restrictions Weight Bearing Restrictions: Yes LLE Weight Bearing: Partial weight bearing Other Position/Activity Restrictions: weightbearing through heel only   Pertinent Vitals/Pain Pain limiting mobility.    Mobility  Bed Mobility Bed Mobility: Supine to Sit;Sit to Supine Supine to Sit: 4: Min guard;With rails;HOB flat Sit to Supine: 5: Supervision;HOB flat Details for Bed Mobility Assistance: No cues or assist needed Transfers Transfers: Sit to Stand;Stand to Sit Sit to Stand: 4: Min assist;With upper extremity assist;From bed Stand to Sit: 4: Min guard;With upper extremity assist;To bed Details for Transfer Assistance: Verbal cues for hand placement.  Assist for safety/balance Ambulation/Gait Ambulation/Gait Assistance: 4: Min guard Ambulation Distance (Feet): 82 Feet Assistive device: Rolling walker Ambulation/Gait Assistance Details: Verbal cues to stand upright - Darco boot causing patient to flex at hip with knee extended.  Assist for balance and safety. Gait Pattern: Step-to pattern;Decreased stance time - left;Decreased step length - right;Antalgic;Trunk  flexed Gait velocity: Slow gait speed      PT Goals Acute Rehab PT Goals PT Goal: Supine/Side to Sit - Progress: Progressing toward goal PT Goal: Sit to Supine/Side - Progress: Progressing toward goal PT Goal: Sit to Stand - Progress: Progressing toward goal PT Goal: Stand to Sit - Progress: Progressing toward goal PT Transfer Goal: Bed to Chair/Chair to Bed - Progress: Progressing toward goal PT Goal: Ambulate - Progress: Progressing toward goal  Visit Information  Last PT Received On: 12/15/12 Assistance Needed: +1    Subjective Data  Subjective: "It didn't hurt as bad as I thought it was going to"   Cognition  Cognition Overall Cognitive Status: Appears within functional limits for tasks assessed/performed Arousal/Alertness: Awake/alert Orientation Level: Appears intact for tasks assessed Behavior During Session: [facility] for tasks performed    Balance     End of Session PT - End of Session Equipment Utilized During Treatment: Gait belt (Darco shoe LLE) Activity Tolerance: Patient limited by pain;Patient limited by fatigue Patient left: in bed;with call bell/phone within reach Nurse Communication: Mobility status   GP     Vena Austria 12/15/2012, 5:23 PM Durenda Hurt. Renaldo Fiddler, [facility] Acute Rehab Services Pager [phone]

## 2012-12-15 NOTE — Progress Notes (Addendum)
[facility] [place]  Progress Note Bypass Surgery  Date of Surgery: 12/07/2012 - 12/13/2012  Procedure(s): Left below knee popliteal artery to posterior tibial artery bypass graft with ipsilateral reverse greater saphenous vein  Surgeon: Surgeon(s): Nada Libman, MD  2 Days Post-Op  History of Present Illness  Albert [name] is a 52 y.o. male who is S/P Procedure(s): BYPASS GRAFT POPLITEAL TO TIBIAL left.   . Patients pain is well controlled.    VASC. LAB Studies:        ABI: Right 1;  Left 0.97;   Physical Examination  BP Readings from Last 3 Encounters:  12/15/12 139/84  12/15/12 139/84  12/15/12 139/84   Temp Readings from Last 3 Encounters:  12/15/12 99 F (37.2 C) Oral  12/15/12 99 F (37.2 C) Oral  12/15/12 99 F (37.2 C) Oral   SpO2 Readings from Last 3 Encounters:  12/15/12 97%  12/15/12 97%  12/15/12 97%   Pulse Readings from Last 3 Encounters:  12/15/12 74  12/15/12 74  12/15/12 74    Pt is A&O x 3 left lower extremity: Incision/s is/are clean,dry.intact, and  healing without hematoma, erythema or drainage Limb is warm; with good color Dressing intact with old drainage on left foot  Assessment/Plan: Pt. Doing well Post-op pain is controlled Wounds are healing well with min serous drainage Toe amp dressing intact - care per Dr Magnus Ivan PT/OT for ambulation per ortho Continue wound care as ordered  [name] [phone] 12/15/2012 9:27 AM        Agree with the above assessment Right popliteal posterior tibial graft is patent  Continue heparin drip through weekend

## 2012-12-15 NOTE — Progress Notes (Signed)
Family Medicine Teaching Service Attending Note  I discussed patient Albert [name]  with Dr. Hess and reviewed their note for today.  I agree with their assessment and plan.      

## 2012-12-15 NOTE — Progress Notes (Signed)
Dressing to lower left leg incision with minimal drainage. Dressing changed. Clean dry and intact. Will continue to monitor drainage and site. Albert [name]

## 2012-12-15 NOTE — Progress Notes (Signed)
FMTS Daily Intern Progress Note: [id number]  Subjective:  POD 7 left 3rd and 5th ray amputation. POD # 4 for 2nd ray amputation. POD #68for lower extremity bypass on left  Pain controled  No N/V/D, no CP, SOB  I have reviewed the patient's medications.  Objective Temp:  [98.8 F (37.1 C)-99.3 F (37.4 C)] 99 F (37.2 C) (03/01 0545) Pulse Rate:  [74-106] 74 (03/01 0545) Resp:  [16-18] 16 (03/01 0545) BP: (131-147)/(78-87) 139/84 mmHg (03/01 0545) SpO2:  [97 %-98 %] 97 % (03/01 0545)   Intake/Output Summary (Last 24 hours) at 12/15/12 0826 Last data filed at 12/15/12 0718  Gross per 24 hour  Intake    680 ml  Output   4200 ml  Net  -3520 ml    CBG (last 3)   Recent Labs  12/14/12 1744 12/14/12 2121 12/15/12 0635  GLUCAP 267* 322* 236*    General: no acute distress HEENT: moist mucous membranes, PERRLA, EOMI CV: S1S2, RRR, no murmur Pulm: cta b/l Ext: dressing clean and dry, + medial incision intact without erythema along calf Left Side, no serous drainage  Neuro: no focal deficits.   Labs and Imaging  Recent Labs Lab 12/11/12 0615 12/13/12 1613 12/13/12 2308  WBC 13.1* 13.7* 12.0*  HGB 11.2* 11.1* 10.8*  HCT 33.2* 33.4* 31.2*  PLT 326 341 366     Recent Labs Lab 12/13/12 2308 12/14/12 0415 12/15/12 0500  NA 128* 129* 131*  K 4.3 4.2 4.5  CL 93* 94* 95*  CO2 27 27 31   BUN 11 11 12   CREATININE 0.81 0.79 0.97  GLUCOSE 295* 252* 237*  CALCIUM 8.2* 8.5 8.6   ECHO 12/12/12 - EF 50-55%, grade one diastolic HF   ABI: >1.0 on right, 0.85 on left.  Repeat on 12/13/12 ABI > 1.0 on R, 0.97 on L  Assessment and Plan 52 y.o. 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 Poor blood flow likely due to combination of microvascular and large vessel disease disease 2o/2 poorly controlled DM and likely atherosclerosis given hx of prior CVA  # left 3rd and 5th toe necrosis: POD #  7 s/p 3rd and 5th toe ray amputation. POD #4 2nd ray amputation  -PT recommends home health PT with walker.  Will order for home use  -Ortho has done wound changes and recommends f/u in outpatient setting  - Resolved s/p amputation per ortho   # peripheral vascular disease: evaluated by vascular surgery.  - Pt went for aortogram showing Posterior tibial artery occluded on the right and left  - Vascular surgery performed bypass on left side on 12/13/12  Greatly appreciate recommendations   - Cardiology cleared patient for pre-op.  Have signed off  -PT/OT consults s/p surgery on 2/27.  No new interventions   - Will need to transition from heparin drip.  Per Vascular, will need to be on for another 24-48 hrs before transitioning to coumadin.    # HTN: on metoprolol 50mg  bid. Home irbesartan/HCTZ 300/12.5mg  held initially.  Increased Metoprolol to 100 mg BID and restarted Ibersartan on 12/13/12.  BP has been around 140-160s systolic   # Acute renal failure: most recent cr: 0.66 in 2012.  - Resolved, creatinine stable  # DM: A1C: 9.0. On levemir 20qhs at home with novlolg 12 tid with meals and glipizide/metformin - Custom SSI increased from 0-30 and also Lantus 40  U.  CBGs continued to be elevated, will increase SSI by 5 at each interval.   #Hyponatremia - Corrected for high sugars is actually pseudohyponatremia.  Will continue to monitor daily   FEN: carb modified, Protonix  PPx: heparin drip per vascular Dispo: Pending vascular recommendations, will need transition from Heparin drip to PO med  Fares Ramthun R. Paulina Fusi, DO of [facility] 12/15/2012, 8:26 AM

## 2012-12-16 ENCOUNTER — Encounter (HOSPITAL_COMMUNITY): Payer: Self-pay | Admitting: Surgery

## 2012-12-16 LAB — GLUCOSE, CAPILLARY
Glucose-Capillary: 228 mg/dL — ABNORMAL HIGH (ref 70–99)
Glucose-Capillary: 206 mg/dL — ABNORMAL HIGH (ref 70–99)

## 2012-12-16 LAB — BASIC METABOLIC PANEL
CO2: 27 mEq/L (ref 19–32)
Glucose, Bld: 221 mg/dL — ABNORMAL HIGH (ref 70–99)
Potassium: 4.2 mEq/L (ref 3.5–5.1)
Sodium: 131 mEq/L — ABNORMAL LOW (ref 135–145)

## 2012-12-16 LAB — HEPARIN LEVEL (UNFRACTIONATED): Heparin Unfractionated: <0.1 IU/mL — ABNORMAL LOW (ref 0.30–0.70)

## 2012-12-16 LAB — CBC
HCT: 33.7 % — ABNORMAL LOW (ref 39.0–52.0)
Hemoglobin: 11.3 g/dL — ABNORMAL LOW (ref 13.0–17.0)
WBC: 12.1 10*3/uL — ABNORMAL HIGH (ref 4.0–10.5)

## 2012-12-16 MED ORDER — INSULIN GLARGINE 100 UNIT/ML ~~LOC~~ SOLN
35.0000 [IU] | Freq: Every day | SUBCUTANEOUS | Status: DC
  Administered 2012-12-16: 35 [IU] via SUBCUTANEOUS

## 2012-12-16 NOTE — Progress Notes (Signed)
Family Medicine Teaching Service Attending Note  I discussed patient Albert [name]  with Dr. Burtis Junes and reviewed their note for today.  I agree with their assessment and plan.

## 2012-12-16 NOTE — Progress Notes (Addendum)
[facility] [place]  Progress Note Bypass Surgery  Date of Surgery: 12/07/2012 - 12/13/2012  Procedure(s): BYPASS GRAFT POPLITEAL TO TIBIAL Surgeon: Surgeon(s): Nada Libman, MD  3 Days Post-Op  History of Present Illness  KIYAAN HAQ is a 52 y.o. male who is S/P Procedure(s): BYPASS GRAFT POPLITEAL TO TIBIAL left. Patients pain is well controlled.   he states the leg is getting better and he is beginning to ambulate  VASC. LAB Studies:        ABI: Right 1;  Left 0.97;   Imaging: No results found.  Significant Diagnostic Studies: CBC Lab Results  Component Value Date   WBC 12.1* 12/16/2012   HGB 11.3* 12/16/2012   HCT 33.7* 12/16/2012   MCV 79.9 12/16/2012   PLT 416* 12/16/2012    BMET     Component Value Date/Time   NA 131* 12/16/2012 0450   K 4.2 12/16/2012 0450   CL 94* 12/16/2012 0450   CO2 27 12/16/2012 0450   GLUCOSE 221* 12/16/2012 0450   BUN 13 12/16/2012 0450   CREATININE 0.85 12/16/2012 0450   CALCIUM 8.8 12/16/2012 0450   GFRNONAA >90 12/16/2012 0450   GFRAA >90 12/16/2012 0450    COAG No results found for this basename: INR, PROTIME   No results found for this basename: PTT    Physical Examination  BP Readings from Last 3 Encounters:  12/16/12 141/79  12/16/12 141/79  12/16/12 141/79   Temp Readings from Last 3 Encounters:  12/16/12 98.6 F (37 C) Oral  12/16/12 98.6 F (37 C) Oral  12/16/12 98.6 F (37 C) Oral   SpO2 Readings from Last 3 Encounters:  12/16/12 98%  12/16/12 98%  12/16/12 98%   Pulse Readings from Last 3 Encounters:  12/16/12 77  12/16/12 77  12/16/12 77    Pt is A&O x 3 left lower extremity: Incision/s is/are clean,dry.intact, and  healing without hematoma, erythema or drainage Limb is warm; with good color  Left AT pulse is absent Left Posterior tibial pulse is  monophasic by Doppler Left peroneal pulse is biphasic by Doppler  Assessment/Plan: Pt. Doing well Post-op pain is controlled Wounds are  healing well PT/OT for ambulation Will need HH PT Continue wound care as ordered per ortho for toe amp  [name] [phone] 12/16/2012 8:50 AM    Agree with above assessment Dressing changes foot per Dr. Magnus Ivan Bypass patent-patient remains on heparin Further plans per Dr. Myra Gianotti

## 2012-12-16 NOTE — Progress Notes (Signed)
FMTS Daily Intern Progress Note: [id number]  Subjective:  POD 8 left 3rd and 5th ray amputation. POD # 5 for 2nd ray amputation. POD #55for lower extremity bypass on left   No complaints of N/V/D, no CP, SOB  I have reviewed the patient's medications.  Objective Temp:  [98.1 F (36.7 C)-98.7 F (37.1 C)] 98.6 F (37 C) (03/02 0415) Pulse Rate:  [77-82] 77 (03/02 0415) Resp:  [17-18] 17 (03/02 0415) BP: (97-148)/(59-86) 141/79 mmHg (03/02 0415) SpO2:  [97 %-98 %] 98 % (03/02 0415)   Intake/Output Summary (Last 24 hours) at 12/16/12 0809 Last data filed at 12/16/12 0321  Gross per 24 hour  Intake  282.8 ml  Output   3200 ml  Net -2917.2 ml    CBG (last 3)   Recent Labs  12/15/12 1620 12/15/12 2056 12/16/12 0613  GLUCAP 226* 180* 228*    General: no acute distress HEENT: moist mucous membranes, PERRLA, EOMI CV: S1S2, RRR, no murmur Pulm: cta b/l Ext: dressing clean and dry, + medial incision intact without erythema along calf Left Side, no serous drainage, 1+ pitting edema left leg. Neuro: no focal deficits.   Labs and Imaging  Recent Labs Lab 12/13/12 1613 12/13/12 2308 12/16/12 0450  WBC 13.7* 12.0* 12.1*  HGB 11.1* 10.8* 11.3*  HCT 33.4* 31.2* 33.7*  PLT 341 366 416*     Recent Labs Lab 12/14/12 0415 12/15/12 0500 12/16/12 0450  NA 129* 131* 131*  K 4.2 4.5 4.2  CL 94* 95* 94*  CO2 27 31 27   BUN 11 12 13   CREATININE 0.79 0.97 0.85  GLUCOSE 252* 237* 221*  CALCIUM 8.5 8.6 8.8   ECHO 12/12/12 - EF 50-55%, grade one diastolic HF   ABI: >1.0 on right, 0.85 on left.  Repeat on 12/13/12 ABI > 1.0 on R, 0.97 on L  Assessment and Plan 52 y.o. 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  # left 3rd and 5th toe necrosis: s/p 3rd and 5th toe ray amputation, and separate 2nd ray amputation this hospitalization. -PT recommends home  health PT with walker.  Will order for home use  -Ortho has done wound changes and recommends f/u in outpatient setting  - Resolved s/p amputation per ortho   # peripheral vascular disease: evaluated by vascular surgery. Aortogram showed Posterior tibial artery occluded on the right and left  - Vascular surgery performed bypass on left side on 12/13/12  Greatly appreciate recommendations   - Cardiology cleared patient for pre-op.  Have signed off  - Will need to transition from heparin drip.  Per Vascular, will continue heparin today before transitioning to coumadin.    # HTN: on metoprolol 50mg  bid. Home irbesartan/HCTZ 300/12.5mg  held initially.  Increased Metoprolol to 100 mg BID and restarted Ibersartan on 12/13/12.  BP has been around 140-160s systolic   # Acute renal failure: most recent cr: 0.66 in 2012.  - Resolved, creatinine stable  # DM: A1C: 9.0. On levemir 20qhs at home with novlolg 12 tid with meals and glipizide/metformin - Custom SSI increased from 0-30 and also Lantus 30 U.  CBGs continued to be elevated with increased SSI by 5 at each interval. Will increase lantus to 35 units tonight.   #Hyponatremia - Corrected for high sugars is actually pseudohyponatremia.  Will continue to monitor daily   FEN: carb modified, Protonix  PPx: heparin drip per vascular Dispo: Pending vascular recommendations, will need transition from Heparin drip to PO med. Full code. Will need home health PT/24 hr assistance recommended at time of DC.   Lloyd Huger, MD [facility] Resident - PGY-3 12/16/2012 8:10 AM  [facility]

## 2012-12-16 NOTE — Care Management Note (Signed)
CARE MANAGEMENT NOTE 12/16/2012  Patient:  ABEM, SHADDIX   Account Number:  [id number]  Date Initiated:  12/10/2012  Documentation initiated by:  Darlyne Russian  Subjective/Objective Assessment:   Patient s/p toe amputation one month prior, seen 2 days prior to admission for  debridement.     Action/Plan:   Progression of care and discharge planning   Anticipated DC Date:  12/17/2012   Anticipated DC Plan:  HOME W HOME HEALTH SERVICES      DC Planning Services  CM consult      PAC Choice  DURABLE MEDICAL EQUIPMENT  HOME HEALTH   Choice offered to / List presented to:  C-1 Patient   DME arranged  3-N-1  Levan Hurst      DME agency  [facility]     [facility] arranged  HH-1 RN  HH-2 PT      [facility] agency  [facility]   Status of service:  In process, will continue to follow Medicare Important Message given?   (If response is "NO", the following Medicare IM given date fields will be blank) Date Medicare IM given:   Date Additional Medicare IM given:    Discharge Disposition:    Per UR Regulation:  Reviewed for med. necessity/level of care/duration of stay  If discussed at Long Length of Stay Meetings, dates discussed:    Comments:  12/16/12 1510 Tymeeka Davis,RN,BSN [phone] Cm spoke with patient concerning discharge planning. Pt offered choice for [facility]. Per pt choice [facility] to provide [facility] services upon discharge. Pt to discharge home with spouse.Cm faxed facesheet, H/P, & MD orders to [phone]. Confirmation received. Pt states having RW for DME use.Awaiting [facility] delivery to room prior to discharge.

## 2012-12-17 DIAGNOSIS — I10 Essential (primary) hypertension: Secondary | ICD-10-CM | POA: Diagnosis present

## 2012-12-17 DIAGNOSIS — E1369 Other specified diabetes mellitus with other specified complication: Secondary | ICD-10-CM | POA: Diagnosis present

## 2012-12-17 LAB — HEPARIN LEVEL (UNFRACTIONATED): Heparin Unfractionated: <0.1 IU/mL — ABNORMAL LOW (ref 0.30–0.70)

## 2012-12-17 LAB — BASIC METABOLIC PANEL
BUN: 16 mg/dL (ref 6–23)
CO2: 29 mEq/L (ref 19–32)
Chloride: 97 mEq/L (ref 96–112)
Creatinine, Ser: 0.99 mg/dL (ref 0.50–1.35)

## 2012-12-17 LAB — CBC
Hemoglobin: 12.1 g/dL — ABNORMAL LOW (ref 13.0–17.0)
MCH: 26.8 pg (ref 26.0–34.0)
MCV: 80.3 fL (ref 78.0–100.0)
RBC: 4.51 MIL/uL (ref 4.22–5.81)

## 2012-12-17 LAB — GLUCOSE, CAPILLARY: Glucose-Capillary: 200 mg/dL — ABNORMAL HIGH (ref 70–99)

## 2012-12-17 LAB — APTT: aPTT: 33 seconds (ref 24–37)

## 2012-12-17 MED ORDER — COLLAGENASE 250 UNIT/GM EX OINT: TOPICAL_OINTMENT | Freq: Every day | CUTANEOUS | Status: DC

## 2012-12-17 MED ORDER — RIVAROXABAN 20 MG PO TABS: 20.0000 mg | ORAL_TABLET | Freq: Every day | ORAL | Status: DC

## 2012-12-17 MED ORDER — INSULIN GLARGINE 100 UNIT/ML ~~LOC~~ SOLN
40.0000 [IU] | Freq: Every day | SUBCUTANEOUS | Status: DC
  Administered 2012-12-17: 40 [IU] via SUBCUTANEOUS

## 2012-12-17 MED ORDER — RIVAROXABAN 20 MG PO TABS
20.0000 mg | ORAL_TABLET | Freq: Every day | ORAL | Status: DC
  Administered 2012-12-17: 20 mg via ORAL
  Filled 2012-12-17 (×2): qty 1

## 2012-12-17 MED ORDER — HEPARIN (PORCINE) IN NACL 100-0.45 UNIT/ML-% IJ SOLN
400.0000 [IU]/h | INTRAMUSCULAR | Status: AC
  Administered 2012-12-17: 400 [IU]/h via INTRAVENOUS
  Filled 2012-12-17: qty 250

## 2012-12-17 NOTE — Progress Notes (Signed)
FMTS Daily Intern Progress Note: [phone]  Subjective:  POD 9 left 3rd and 5th ray amputation. POD # 6 for 2nd ray amputation. POD #4 for lower extremity bypass on left   No complaints of N/V/D, no CP, SOB  I have reviewed the patient's medications.  Objective Temp:  [97.8 F (36.6 C)-98.7 F (37.1 C)] 97.8 F (36.6 C) (03/03 0432) Pulse Rate:  [67-79] 69 (03/03 0432) Resp:  [18-19] 19 (03/03 0432) BP: (108-138)/(52-78) 138/78 mmHg (03/03 0432) SpO2:  [97 %-100 %] 100 % (03/03 0432)   Intake/Output Summary (Last 24 hours) at 12/17/12 0956 Last data filed at 12/17/12 0700  Gross per 24 hour  Intake    328 ml  Output   3750 ml  Net  -3422 ml    CBG (last 3)   Recent Labs  12/16/12 1615 12/16/12 2045 12/17/12 0602  GLUCAP 195* 206* 200*    General: no acute distress HEENT: moist mucous membranes, PERRLA, EOMI CV: S1S2, RRR, no murmur Pulm: cta b/l Ext: dressing clean and dry, + medial incision intact without erythema along calf Left Side, no serous drainage, 1+ pitting edema left leg. Neuro: no focal deficits.   Labs and Imaging  Recent Labs Lab 12/13/12 2308 12/16/12 0450 12/17/12 0500  WBC 12.0* 12.1* 11.2*  HGB 10.8* 11.3* 12.1*  HCT 31.2* 33.7* 36.2*  PLT 366 416* 417*     Recent Labs Lab 12/15/12 0500 12/16/12 0450 12/17/12 0500  NA 131* 131* 132*  K 4.5 4.2 4.6  CL 95* 94* 97  CO2 31 27 29   BUN 12 13 16   CREATININE 0.97 0.85 0.99  GLUCOSE 237* 221* 208*  CALCIUM 8.6 8.8 9.0   ECHO 12/12/12 - EF 50-55%, grade one diastolic HF   ABI: >1.0 on right, 0.85 on left.  Repeat on 12/13/12 ABI > 1.0 on R, 0.97 on L  Assessment and Plan 52 y.o. 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  # left 3rd and 5th toe necrosis: s/p 3rd and 5th toe ray amputation, and separate 2nd ray amputation this hospitalization. -PT recommends home  health PT with walker.  Will order for home use  -Ortho has done wound changes and recommends f/u in outpatient setting  - Resolved s/p amputation per ortho   # peripheral vascular disease: evaluated by vascular surgery. Aortogram showed Posterior tibial artery occluded on the right and left  - Vascular surgery performed bypass on left side on 12/13/12  Greatly appreciate recommendations   - Cardiology cleared patient for pre-op.  Have signed off  - Will need to transition from heparin drip.  Per Vascular, will continue heparin today before transitioning to coumadin.  Dispo pending upon this   # HTN: on metoprolol 50mg  bid. Home irbesartan/HCTZ 300/12.5mg  held initially.  Increased Metoprolol to 100 mg BID and restarted Ibersartan on 12/13/12.  BP has been around 140-160s systolic   # Acute renal failure: most recent cr: 0.66 in 2012.  - Resolved, creatinine stable  # DM: A1C: 9.0. On levemir 20qhs at home with novlolg 12 tid with meals and glipizide/metformin - Custom SSI increased from 0-30 and also Lantus 35 U.  CBGs continued to be elevated with increased SSI by 5 at each interval. Will increase Lantus to 40 U today.   #Hyponatremia - Corrected for high sugars is actually pseudohyponatremia.  Will continue to  monitor daily   FEN: carb modified, Protonix  PPx: heparin drip per vascular Dispo: Pending vascular recommendations, will need transition from Heparin drip to PO med. Full code. Will need home health PT/OT/RN /24 hr assistance recommended at time of DC.   Twana First Paulina Fusi, DO [facility] Resident - PGY-1 12/17/2012 9:56 AM  [facility]

## 2012-12-17 NOTE — Progress Notes (Signed)
Occupational Therapy Treatment Patient Details Name: Albert [name] MRN: [id number] DOB: 1961/01/10 Today's Date: 12/17/2012 Time: 1015-1030 OT Time Calculation (min): 15 min  OT Assessment / Plan / Recommendation Comments on Treatment Session Pt doing well with adls and with adl mobility.  Overall at S level    Follow Up Recommendations  Home health OT    Barriers to Discharge       Equipment Recommendations  3 in 1 bedside comode    Recommendations for Other Services    Frequency Min 2X/week   Plan Discharge plan remains appropriate    Precautions / Restrictions Precautions Precautions: Fall Precaution Comments: darco shoe on the left foot for mobility Required Braces or Orthoses: Other Brace/Splint Other Brace/Splint: Darco boot Restrictions Weight Bearing Restrictions: Yes LLE Weight Bearing: Partial weight bearing LLE Partial Weight Bearing Percentage or Pounds: through the heel only Other Position/Activity Restrictions: weightbearing through heel only   Pertinent Vitals/Pain Pt with 5/10 pain when standing.    ADL  Grooming: Performed;Wash/dry hands;Wash/dry face;Supervision/safety Where Assessed - Grooming: Supported standing Lower Body Dressing: Performed;Minimal assistance Where Assessed - Lower Body Dressing: Supported sit to Pharmacist, hospital: Research scientist (life sciences) Method: Surveyor, minerals: Materials engineer and Hygiene: Performed;Supervision/safety Where Assessed - Engineer, mining and Hygiene: Standing Equipment Used: Rolling walker;Other (comment) Transfers/Ambulation Related to ADLs: Pt overall S with mobility in room with RW and Darko shoe. ADL Comments: Pt with h/o CVA affecting R side but does well with adls.  Spoke at length with him about tying shoes and fastening buttons.  Pt is I with these tasks w/o AE;it just takes extra time.  Pt was I donning sock and  shoe and darko shoe this am.    OT Diagnosis:    OT Problem List:   OT Treatment Interventions:     OT Goals Acute Rehab OT Goals OT Goal Formulation: With patient Time For Goal Achievement: 12/24/12 Potential to Achieve Goals: Good ADL Goals Pt Will Perform Grooming: with supervision;Standing at sink;Other (comment) ADL Goal: Grooming - Progress: Met Pt Will Perform Lower Body Bathing: with supervision;with adaptive equipment;Sit to stand from bed ADL Goal: Lower Body Bathing - Progress: Met Pt Will Perform Lower Body Dressing: with supervision;Sit to stand from bed;with adaptive equipment ADL Goal: Lower Body Dressing - Progress: Partly met Pt Will Transfer to Toilet: with supervision;with DME;3-in-1;Maintaining weight bearing status ADL Goal: Toilet Transfer - Progress: Partly met Miscellaneous OT Goals Miscellaneous OT Goal #1: Pt will voice understanding of AE available to compensate for limited FM capabilities when tying or buttoning. OT Goal: Miscellaneous Goal #1 - Progress: Met  Visit Information  Last OT Received On: 12/17/12 Assistance Needed: +1    Subjective Data      Prior Functioning       Cognition  Cognition Overall Cognitive Status: Appears within functional limits for tasks assessed/performed Arousal/Alertness: Awake/alert Orientation Level: Appears intact for tasks assessed Behavior During Session: [facility] for tasks performed    Mobility  Bed Mobility Bed Mobility: Supine to Sit;Sit to Supine Supine to Sit: With rails;HOB flat;6: Modified independent (Device/Increase time) Sit to Supine: 5: Supervision;HOB flat Details for Bed Mobility Assistance: No cues or assist needed Transfers Transfers: Sit to Stand;Stand to Sit Sit to Stand: 5: Supervision Stand to Sit: 5: Supervision Details for Transfer Assistance: Verbal cues for hand placement.      Exercises      Balance Balance Balance Assessed: Yes Static Standing Balance Static Standing -  Balance Support: Bilateral upper extremity supported Static Standing - Level of Assistance: 5: Stand by assistance Static Standing - Comment/# of Minutes: 3 Dynamic Standing Balance Dynamic Standing - Balance Support: Right upper extremity supported;Left upper extremity supported Dynamic Standing - Level of Assistance: 5: Stand by assistance   End of Session OT - End of Session Activity Tolerance: Patient tolerated treatment well Patient left: in bed;with call bell/phone within reach Nurse Communication: Mobility status  GO     Hope Budds 12/17/2012, 10:39 AM [phone]

## 2012-12-17 NOTE — Progress Notes (Signed)
Physical Therapy Treatment Patient Details Name: Albert [name] MRN: [id number] DOB: Aug 09, 1961 Today's Date: 12/17/2012 Time: 1191-4782 PT Time Calculation (min): 24 min  PT Assessment / Plan / Recommendation Comments on Treatment Session  Progressing well with ambulation, walked 150' with RW today. REady to DC home from PT standpoint.     Follow Up Recommendations  Home health PT;Supervision/Assistance - 24 hour     Does the patient have the potential to tolerate intense rehabilitation     Barriers to Discharge        Equipment Recommendations  Rolling walker with 5" wheels    Recommendations for Other Services    Frequency Min 4X/week   Plan Discharge plan remains appropriate;Frequency remains appropriate    Precautions / Restrictions Precautions Precautions: Fall Required Braces or Orthoses: Other Brace/Splint Other Brace/Splint: Darco boot Restrictions Weight Bearing Restrictions: Yes LLE Weight Bearing: Partial weight bearing Other Position/Activity Restrictions: weightbearing through heel only   Pertinent Vitals/Pain **5/10 L foot at rest and with walking Pain meds requested*    Mobility  Bed Mobility Bed Mobility: Supine to Sit;Sit to Supine Supine to Sit: With rails;HOB flat;6: Modified independent (Device/Increase time) Details for Bed Mobility Assistance: No cues or assist needed Transfers Transfers: Sit to Stand;Stand to Sit Sit to Stand: With upper extremity assist;From bed;5: Supervision Stand to Sit: With upper extremity assist;To bed;5: Supervision Details for Transfer Assistance: Verbal cues for hand placement.   Ambulation/Gait Ambulation/Gait Assistance: 6: Modified independent (Device/Increase time) Ambulation Distance (Feet): 150 Feet Assistive device: Rolling walker Gait Pattern: Step-to pattern;Decreased stance time - left;Decreased step length - right;Trunk flexed General Gait Details: VCs to correct flexed neck    Exercises     PT  Diagnosis:    PT Problem List:   PT Treatment Interventions:     PT Goals Acute Rehab PT Goals PT Goal Formulation: With patient Time For Goal Achievement: 12/28/12 Potential to Achieve Goals: Good Pt will go Supine/Side to Sit: with modified independence;with HOB 0 degrees PT Goal: Supine/Side to Sit - Progress: Met Pt will go Sit to Supine/Side: with modified independence;with HOB 0 degrees Pt will go Sit to Stand: with modified independence PT Goal: Sit to Stand - Progress: Progressing toward goal Pt will go Stand to Sit: with modified independence PT Goal: Stand to Sit - Progress: Progressing toward goal Pt will Transfer Bed to Chair/Chair to Bed: with modified independence Pt will Ambulate: 16 - 50 feet;with modified independence;with rolling walker PT Goal: Ambulate - Progress: Met Pt will Go Up / Down Stairs: 1-2 stairs;with modified independence;with rolling walker Pt will Perform Home Exercise Program: Other (comment) (d/c goal from earlier assessment)  Visit Information  Last PT Received On: 12/17/12 Assistance Needed: +1    Subjective Data  Subjective: I'm ready to go home! Patient Stated Goal: rehab this leg then go back to work at [employer] Overall Cognitive Status: Appears within functional limits for tasks assessed/performed Arousal/Alertness: Awake/alert Orientation Level: Appears intact for tasks assessed Behavior During Session: [facility] for tasks performed    Balance  Balance Balance Assessed: Yes Static Standing Balance Static Standing - Balance Support: Bilateral upper extremity supported Static Standing - Level of Assistance: 5: Stand by assistance Static Standing - Comment/# of Minutes: pt stood for 2 min with RW prior to walking Dynamic Standing Balance Dynamic Standing - Balance Support: Right upper extremity supported;Left upper extremity supported Dynamic Standing - Level of Assistance: 5: Stand by assistance  End of Session  PT - End of Session Equipment Utilized During Treatment: Gait belt (Darco shoe LLE) Activity Tolerance: Patient tolerated treatment well Patient left: in bed;with call bell/phone within reach Nurse Communication: Mobility status   GP     Tamala Ser 12/17/2012, 9:38 AM [phone]

## 2012-12-17 NOTE — Progress Notes (Signed)
Patient ID: Albert [name], male   DOB: 1960/12/06, 52 y.o.   MRN: [id number] Dressing changed around left foot at the bedside.  One area of wound dehiscence around middle of incision.  Forefoot dusky. Difficult to tell right now if healing will be possible given his bypass surgery.  Will need local wound care with dressing changes and close follow-up in the office. Will leave follow-up info and dressing change info in discharge instructions.

## 2012-12-17 NOTE — Progress Notes (Signed)
Seen and examined.  Agree with Dr. Paulina Fusi' documentation and management.  I DCed his cardiac monitor.  He is anxious to go home.  Await vascular surg recs re: antiocoag.

## 2012-12-17 NOTE — Progress Notes (Signed)
Pt's copay will be $18/month for Xarelto 20 mg qd, and his pharmacy has Xarelto in stock.

## 2012-12-17 NOTE — Progress Notes (Signed)
Inpatient Diabetes Program Recommendations  AACE/ADA: New Consensus Statement on Inpatient Glycemic Control (2013)  Target Ranges:  Prepandial:   less than 140 mg/dL      Peak postprandial:   less than 180 mg/dL (1-2 hours)      Critically ill patients:  140 - 180 mg/dL   Results for ADDISON, WHIDBEE (MRN [id number]) as of 12/17/2012 10:47  Ref. Range 12/16/2012 06:13 12/16/2012 11:11 12/16/2012 16:15 12/16/2012 20:45 12/17/2012 06:02  Glucose-Capillary Latest Range: 70-99 mg/dL 469 (H) 629 (H) 528 (H) 206 (H) 200 (H)   Inpatient Diabetes Program Recommendations Insulin - Basal: Please consider increasing basal insulin. Recommend Lantus 40 units QHS.   Note: Blood glucose over the last 24 hours has ranged from 189-228 mg/dl.  Please consider increasing Lantus to 40 units QHS.  Will continue to follow.    Thanks, Orlando Penner, RN, BSN, CCRN Diabetes Coordinator Inpatient Diabetes Program [phone]

## 2012-12-17 NOTE — Progress Notes (Addendum)
[facility]  Progress Note Bypass Surgery  Date of Surgery: 12/07/2012 - 12/13/2012  Procedure(s): right BYPASS GRAFT POPLITEAL TO TIBIAL Surgeon: Surgeon(s): Nada Libman, MD  4 Days Post-Op  History of Present Illness  Albert [name] is a 52 y.o. male who is S/P Procedure(s): BYPASS GRAFT POPLITEAL TO TIBIAL left.  The patient's wounds are healing. Amp site per Dr. Magnus Ivan. Patients pain is well controlled.    Significant Diagnostic Studies: CBC Lab Results  Component Value Date   WBC 11.2* 12/17/2012   HGB 12.1* 12/17/2012   HCT 36.2* 12/17/2012   MCV 80.3 12/17/2012   PLT 417* 12/17/2012    BMET     Component Value Date/Time   NA 132* 12/17/2012 0500   K 4.6 12/17/2012 0500   CL 97 12/17/2012 0500   CO2 29 12/17/2012 0500   GLUCOSE 208* 12/17/2012 0500   BUN 16 12/17/2012 0500   CREATININE 0.99 12/17/2012 0500   CALCIUM 9.0 12/17/2012 0500   GFRNONAA >90 12/17/2012 0500   GFRAA >90 12/17/2012 0500   Physical Examination  BP Readings from Last 3 Encounters:  12/17/12 138/78  12/17/12 138/78  12/17/12 138/78   Temp Readings from Last 3 Encounters:  12/17/12 97.8 F (36.6 C) Oral  12/17/12 97.8 F (36.6 C) Oral  12/17/12 97.8 F (36.6 C) Oral   SpO2 Readings from Last 3 Encounters:  12/17/12 100%  12/17/12 100%  12/17/12 100%   Pulse Readings from Last 3 Encounters:  12/17/12 69  12/17/12 69  12/17/12 69    Pt is A&O x 3 left lower extremity: Incision/s is/are clean,dry.intact, and  healing without hematoma, erythema or drainage Limb is warm; with good color  Assessment/Plan: Pt. Doing well Post-op pain is controlled Wounds are healing well AMP site per Ortho ? Begin coumadin today vs other anticoagulation - Dr. Myra Gianotti to assess PT/OT for ambulation Continue wound care as ordered  [name] [phone] 12/17/2012 8:28 AM  Spoke with Dr. Myra Gianotti - we will start xarelto today Pharm recommends 20 mg Xarelto with evening meal  - daily  Will stop heparin with 1st dose of xarelto

## 2012-12-18 LAB — CBC
HCT: 35.2 % — ABNORMAL LOW (ref 39.0–52.0)
Hemoglobin: 11.5 g/dL — ABNORMAL LOW (ref 13.0–17.0)
MCH: 26.4 pg (ref 26.0–34.0)
MCHC: 32.7 g/dL (ref 30.0–36.0)
MCV: 80.9 fL (ref 78.0–100.0)
RBC: 4.35 MIL/uL (ref 4.22–5.81)

## 2012-12-18 LAB — BASIC METABOLIC PANEL
BUN: 15 mg/dL (ref 6–23)
CO2: 27 mEq/L (ref 19–32)
Glucose, Bld: 250 mg/dL — ABNORMAL HIGH (ref 70–99)
Potassium: 4.7 mEq/L (ref 3.5–5.1)
Sodium: 131 mEq/L — ABNORMAL LOW (ref 135–145)

## 2012-12-18 LAB — GLUCOSE, CAPILLARY
Glucose-Capillary: 230 mg/dL — ABNORMAL HIGH (ref 70–99)
Glucose-Capillary: 183 mg/dL — ABNORMAL HIGH (ref 70–99)

## 2012-12-18 MED ORDER — OXYCODONE HCL 5 MG PO TABS: 5.0000 mg | ORAL_TABLET | ORAL | Status: DC | PRN

## 2012-12-18 NOTE — Progress Notes (Signed)
Assessment unchanged. Discussed D/C instructions with pt and family including importance of f/u appointments and taking Xarelto. Pt verbalized understanding. Rx given to pt. IV removed. Pt left via W/C accompanied by NT.

## 2012-12-18 NOTE — Progress Notes (Signed)
Physical Therapy Treatment Patient Details Name: Albert [name] MRN: [id number] DOB: 12-11-1960 Today's Date: 12/18/2012 Time: 7829-5621 PT Time Calculation (min): 24 min  PT Assessment / Plan / Recommendation Comments on Treatment Session  Progressing well with mobility. Ready to DC home from PT standpoint.     Follow Up Recommendations  Home health PT;Supervision - Intermittent     Does the patient have the potential to tolerate intense rehabilitation     Barriers to Discharge        Equipment Recommendations  None recommended by PT (Pt states he has a rolling walker he can use at home.)    Recommendations for Other Services    Frequency Min 3X/week   Plan Discharge plan remains appropriate;Frequency needs to be updated    Precautions / Restrictions Precautions Precautions: Fall Precaution Comments: darco shoe on the left foot for mobility Required Braces or Orthoses: Other Brace/Splint Other Brace/Splint: Darco shoe Restrictions LLE Weight Bearing: Partial weight bearing LLE Partial Weight Bearing Percentage or Pounds: through the heel only   Pertinent Vitals/Pain No c/o's.    Mobility  Transfers Sit to Stand: 6: Modified independent (Device/Increase time);Without upper extremity assist;With armrests;From chair/3-in-1 Stand to Sit: 6: Modified independent (Device/Increase time);With upper extremity assist;To chair/3-in-1;With armrests Details for Transfer Assistance: No cues needed. Ambulation/Gait Ambulation/Gait Assistance: 6: Modified independent (Device/Increase time) Ambulation Distance (Feet): 175 Feet Assistive device: Rolling walker Ambulation/Gait Assistance Details: Place tennis shoe on rt foot to help level pt. Gait Pattern: Decreased stance time - left;Decreased step length - right;Trunk flexed;Step-through pattern Gait velocity: Slow gait speed Stairs: Yes Stairs Assistance: 4: Min assist Stairs Assistance Details (indicate cue type and reason):  verbal cues for sequence/technique Stair Management Technique: Forwards;With walker Number of Stairs: 1    Exercises     PT Diagnosis:    PT Problem List:   PT Treatment Interventions:     PT Goals Acute Rehab PT Goals PT Goal: Sit to Stand - Progress: Met PT Goal: Stand to Sit - Progress: Met PT Goal: Ambulate - Progress: Met PT Goal: Up/Down Stairs - Progress: Progressing toward goal  Visit Information  Last PT Received On: 12/18/12 Assistance Needed: +1    Subjective Data  Subjective: Pt states he is going home today.   Cognition  Cognition Overall Cognitive Status: Appears within functional limits for tasks assessed/performed Arousal/Alertness: Awake/alert Orientation Level: Appears intact for tasks assessed Behavior During Session: [facility] for tasks performed    Balance  Static Standing Balance Static Standing - Balance Support: Left upper extremity supported;During functional activity Static Standing - Level of Assistance: 6: Modified independent (Device/Increase time)  End of Session PT - End of Session Activity Tolerance: Patient tolerated treatment well Patient left: in chair;with call bell/phone within reach   GP     [facility] 12/18/2012, 10:13 AM  [facility] PT [phone]

## 2012-12-18 NOTE — Discharge Summary (Signed)
Seen and examined on the day of discharge.  I agree with Dr. Paulina Fusi' documentation and management.

## 2012-12-18 NOTE — Progress Notes (Signed)
I agree with the above.  He has a palpable graft pulse.  He will be d/c'd on Xaralto with close follow up with myself and Dr. Orson Aloe

## 2012-12-18 NOTE — Progress Notes (Signed)
Inpatient Diabetes Program Recommendations  AACE/ADA: New Consensus Statement on Inpatient Glycemic Control (2013)  Target Ranges:  Prepandial:   less than 140 mg/dL      Peak postprandial:   less than 180 mg/dL (1-2 hours)      Critically ill patients:  140 - 180 mg/dL   Results for QUADRE, BRISTOL (MRN [id number]) as of 12/18/2012 10:21  Ref. Range 12/17/2012 06:02 12/17/2012 11:20 12/17/2012 16:55 12/17/2012 21:16 12/18/2012 06:11  Glucose-Capillary Latest Range: 70-99 mg/dL 403 (H) 474 (H) 259 (H) 262 (H) 230 (H)   Inpatient Diabetes Program Recommendations ICorrection (SSI): Please consider adding bedtime Novolog correction.  Note: In reviewing the chart, it was noted that Novolog correction is not charted against for 17:00 on 12/17/12.  Therefore, not sure if patient received any Novolog correction at 17:00 yesterday.  Bedtime glucose was 262 mg/dl on 5/6/38VFI fasting glucose was 230 mg/dl this morning.  Lantus was increased yesterday to 40 units QHS and was given last night at bedtime.  Please consider adding Novolog bedtime correction if patient is not discharged today.  Will continue to follow.  Thanks, Orlando Penner, RN, BSN, CCRN Diabetes Coordinator Inpatient Diabetes Program [phone]

## 2012-12-18 NOTE — Progress Notes (Signed)
[facility] Progress Note  12/18/2012 10:11 AM POD 5  Subjective:  Pain is improving.  States he is going home today  Afebrile x 24 hrs VSS  Filed Vitals:   12/18/12 0409  BP: 135/76  Pulse: 66  Temp: 98 F (36.7 C)  Resp: 17    Physical Exam: Incisions:  Bypass incisions are c/d/i Extremities:  + doppler signal in the bypass graft.  Left foot is wrapped and dry.  CBC    Component Value Date/Time   WBC 9.3 12/18/2012 0600   RBC 4.35 12/18/2012 0600   HGB 11.5* 12/18/2012 0600   HCT 35.2* 12/18/2012 0600   PLT 416* 12/18/2012 0600   MCV 80.9 12/18/2012 0600   MCH 26.4 12/18/2012 0600   MCHC 32.7 12/18/2012 0600   RDW 13.6 12/18/2012 0600   LYMPHSABS 2.2 12/11/2012 0615   MONOABS 1.2* 12/11/2012 0615   EOSABS 0.1 12/11/2012 0615   BASOSABS 0.1 12/11/2012 0615    BMET    Component Value Date/Time   NA 131* 12/18/2012 0600   K 4.7 12/18/2012 0600   CL 96 12/18/2012 0600   CO2 27 12/18/2012 0600   GLUCOSE 250* 12/18/2012 0600   BUN 15 12/18/2012 0600   CREATININE 0.95 12/18/2012 0600   CALCIUM 9.1 12/18/2012 0600   GFRNONAA >90 12/18/2012 0600   GFRAA >90 12/18/2012 0600    INR No results found for this basename: inr     Intake/Output Summary (Last 24 hours) at 12/18/12 1011 Last data filed at 12/18/12 1610  Gross per 24 hour  Intake    720 ml  Output   2075 ml  Net  -1355 ml     Assessment/Plan:  52 y.o. male is s/p:  Left below knee popliteal artery to posterior tibial artery bypass graft with ipsilateral reverse greater saphenous vein   POD 5  -patent below knee to PT bypass on left -f/u with Dr. Myra Gianotti in 2 weeks. -toe amp site per ortho -pt on xarelto  Doreatha Massed, PA-C [facility] [phone] 12/18/2012 10:11 AM

## 2012-12-19 ENCOUNTER — Encounter: Payer: 59 | Admitting: Vascular Surgery

## 2012-12-23 NOTE — Progress Notes (Signed)
Patient ID: FIN HUPP, male   DOB: 1961/09/19, 52 y.o.   MRN: [id number] This patient had an excisional debridement with a knife in the OR of skin, soft tissue, muscle and bone.

## 2012-12-28 ENCOUNTER — Encounter: Payer: Self-pay | Admitting: Surgery

## 2012-12-31 ENCOUNTER — Encounter: Payer: Self-pay | Admitting: Surgery

## 2012-12-31 ENCOUNTER — Ambulatory Visit (INDEPENDENT_AMBULATORY_CARE_PROVIDER_SITE_OTHER): Payer: 59 | Admitting: Surgery

## 2012-12-31 VITALS — BP 91/67 | HR 106 | Temp 98.2°F | Ht 71.0 in | Wt 246.0 lb

## 2012-12-31 DIAGNOSIS — I739 Peripheral vascular disease, unspecified: Secondary | ICD-10-CM | POA: Insufficient documentation

## 2012-12-31 NOTE — Progress Notes (Signed)
The patient comes back today for followup. He is a 52 year old gentleman who presented to the hospital with gangrenous changes to his foot. He underwent multiple amputations of digits by Dr. Magnus Ivan. On 12/07/2012, he underwent left below knee popliteal artery to posterior tibial artery bypass graft with ipsilateral reversed greater saphenous vein. This vein was a little sclerotic although on the external side it looked to be an adequate vein. I elected to place him on Xaralto in hopes that his graft would remain patent. BX he did very well from his bypass surgery and was discharged to home. He is back today for followup.  On examination his vein harvest incisions have dry eschar covering them with old blood. The posterior tibial artery incision has separated. There is a vigorous base without evidence of infection. With 2+ edema to the right leg. He has a palpable graft pulse and a multiphasic Doppler signal in the posterior tibial artery distal to his bypass graft.  I cleaned the incisions today. I have recommended wet-to-dry dressing changes to the posterior tibial incision I have encouraged him to clean the other incisions with soap and water on a daily basis.  I will see him back for a wound check in 3 weeks. He will continue to have his foot and evaluated by Dr. Magnus Ivan

## 2013-01-04 ENCOUNTER — Encounter: Payer: 59 | Admitting: Vascular Surgery

## 2013-01-15 ENCOUNTER — Encounter (HOSPITAL_BASED_OUTPATIENT_CLINIC_OR_DEPARTMENT_OTHER): Payer: 59 | Attending: General Surgery

## 2013-01-15 ENCOUNTER — Other Ambulatory Visit (HOSPITAL_BASED_OUTPATIENT_CLINIC_OR_DEPARTMENT_OTHER): Payer: Self-pay | Admitting: General Surgery

## 2013-01-15 ENCOUNTER — Ambulatory Visit (HOSPITAL_COMMUNITY)
Admission: RE | Admit: 2013-01-15 | Discharge: 2013-01-15 | Disposition: A | Discharge status: Home / Self Care | Payer: 59 | Source: Ambulatory Visit | Attending: General Surgery | Admitting: General Surgery

## 2013-01-15 DIAGNOSIS — Z8673 Personal history of transient ischemic attack (TIA), and cerebral infarction without residual deficits: Secondary | ICD-10-CM | POA: Insufficient documentation

## 2013-01-15 DIAGNOSIS — I10 Essential (primary) hypertension: Secondary | ICD-10-CM | POA: Insufficient documentation

## 2013-01-15 DIAGNOSIS — G589 Mononeuropathy, unspecified: Secondary | ICD-10-CM | POA: Insufficient documentation

## 2013-01-15 DIAGNOSIS — S98139A Complete traumatic amputation of one unspecified lesser toe, initial encounter: Secondary | ICD-10-CM | POA: Insufficient documentation

## 2013-01-15 DIAGNOSIS — T8131XA Disruption of external operation (surgical) wound, not elsewhere classified, initial encounter: Secondary | ICD-10-CM | POA: Insufficient documentation

## 2013-01-15 DIAGNOSIS — T8789 Other complications of amputation stump: Secondary | ICD-10-CM | POA: Insufficient documentation

## 2013-01-15 DIAGNOSIS — T148XXA Other injury of unspecified body region, initial encounter: Secondary | ICD-10-CM

## 2013-01-15 DIAGNOSIS — Z7982 Long term (current) use of aspirin: Secondary | ICD-10-CM | POA: Insufficient documentation

## 2013-01-15 DIAGNOSIS — Y832 Surgical operation with anastomosis, bypass or graft as the cause of abnormal reaction of the patient, or of later complication, without mention of misadventure at the time of the procedure: Secondary | ICD-10-CM | POA: Insufficient documentation

## 2013-01-15 DIAGNOSIS — L97309 Non-pressure chronic ulcer of unspecified ankle with unspecified severity: Secondary | ICD-10-CM | POA: Insufficient documentation

## 2013-01-15 DIAGNOSIS — L989 Disorder of the skin and subcutaneous tissue, unspecified: Secondary | ICD-10-CM | POA: Insufficient documentation

## 2013-01-15 DIAGNOSIS — Z79899 Other long term (current) drug therapy: Secondary | ICD-10-CM | POA: Insufficient documentation

## 2013-01-15 DIAGNOSIS — I739 Peripheral vascular disease, unspecified: Secondary | ICD-10-CM | POA: Insufficient documentation

## 2013-01-15 DIAGNOSIS — L97509 Non-pressure chronic ulcer of other part of unspecified foot with unspecified severity: Secondary | ICD-10-CM | POA: Insufficient documentation

## 2013-01-15 DIAGNOSIS — Y835 Amputation of limb(s) as the cause of abnormal reaction of the patient, or of later complication, without mention of misadventure at the time of the procedure: Secondary | ICD-10-CM | POA: Insufficient documentation

## 2013-01-15 DIAGNOSIS — E1169 Type 2 diabetes mellitus with other specified complication: Secondary | ICD-10-CM | POA: Insufficient documentation

## 2013-01-16 NOTE — H&P (Signed)
NAME:  Albert [name], Albert [name] NO.:  [id number]  MEDICAL RECORD NO.:  [id number]  LOCATION:  XRAY                         FACILITY:  [facility]  PHYSICIAN:  Joanne Gavel, M.D.        DATE OF BIRTH:  04-14-1961  DATE OF ADMISSION:  01/15/2013 DATE OF DISCHARGE:                             HISTORY & PHYSICAL   CHIEF COMPLAINT:  Wounds, left foot.  HISTORY OF PRESENT ILLNESS:  This 52 year old male diabetic for many years, underwent fourth toe amputation in January and then in February, he had second, third, and fifth toe amputation on the left side, with a vascular bypass.  The vascular bypass wound has broken down at the ankle and the toe amputation area has failed to heal.  PAST MEDICAL HISTORY:  Significant for diabetes, hypertension, stroke in 2011, peripheral vascular disease, sinusitis, and neuropathy.  SOCIAL HISTORY:  Cigarettes none.  Alcohol none.  MEDICATIONS:  Metformin, metoprolol, aspirin, __________, simvastatin, Xarelto, and insulin.  ALLERGIES:  AMOXICILLIN has caused nausea.  REVIEW OF SYSTEMS:  The patient's diabetes was out of control, but is now being carefully controlled with a new medical regimen.  The patient now has good blood supply with an ABI of 0.98.  The patient is not on antibiotics at present, but has been on doxycycline recently.  PHYSICAL EXAMINATION:  VITAL SIGNS:  Temperature 97.7, pulse 100, respirations 16, blood pressure 115/79.  Glucose is 155. EYES, EARS, NOSE, THROAT:  The patient has some evidence of left-sided facial weakness and right-sided arm and leg weakness. CHEST:  Clear. HEART:  Regular rhythm. ABDOMEN:  Not examined. EXTREMITIES:  The left lower extremity reveals at the ankle 7 x 1.5 x 0.4 wound, which has a great deal of slough.  At the left foot, there is a 2.6 x 5.4 x 3.1 wound also with a great deal of slough and necrotic subcutaneous tissue.  IMPRESSION:  Status post left vascular bypass and toe amputations  with failure to heal.  PLAN OF TREATMENT:  We will start with Santyl and Hydrogel.  Consider using the VAC later.  This patient is an excellent candidate for HBO being now with good blood supply, and with good glucose control.     Joanne Gavel, M.D.     RA/MEDQ  D:  01/15/2013  T:  01/16/2013  Job:  [id number]

## 2013-01-29 LAB — GLUCOSE, CAPILLARY: Glucose-Capillary: 114 mg/dL — ABNORMAL HIGH (ref 70–99)

## 2013-01-31 LAB — GLUCOSE, CAPILLARY
Glucose-Capillary: 217 mg/dL — ABNORMAL HIGH (ref 70–99)
Glucose-Capillary: 208 mg/dL — ABNORMAL HIGH (ref 70–99)

## 2013-02-04 LAB — GLUCOSE, CAPILLARY: Glucose-Capillary: 322 mg/dL — ABNORMAL HIGH (ref 70–99)

## 2013-02-05 LAB — GLUCOSE, CAPILLARY
Glucose-Capillary: 300 mg/dL — ABNORMAL HIGH (ref 70–99)
Glucose-Capillary: 214 mg/dL — ABNORMAL HIGH (ref 70–99)

## 2013-02-06 DIAGNOSIS — I70269 Atherosclerosis of native arteries of extremities with gangrene, unspecified extremity: Secondary | ICD-10-CM

## 2013-02-06 DIAGNOSIS — Z0279 Encounter for issue of other medical certificate: Secondary | ICD-10-CM

## 2013-02-06 LAB — GLUCOSE, CAPILLARY
Glucose-Capillary: 322 mg/dL — ABNORMAL HIGH (ref 70–99)
Glucose-Capillary: 210 mg/dL — ABNORMAL HIGH (ref 70–99)

## 2013-02-08 ENCOUNTER — Encounter: Payer: Self-pay | Admitting: Surgery

## 2013-02-11 ENCOUNTER — Encounter: Payer: Self-pay | Admitting: Surgery

## 2013-02-11 ENCOUNTER — Ambulatory Visit (INDEPENDENT_AMBULATORY_CARE_PROVIDER_SITE_OTHER): Payer: 59 | Admitting: Surgery

## 2013-02-11 VITALS — BP 113/80 | HR 118 | Temp 97.9°F | Resp 18 | Ht 71.0 in | Wt 255.0 lb

## 2013-02-11 DIAGNOSIS — I739 Peripheral vascular disease, unspecified: Secondary | ICD-10-CM

## 2013-02-11 DIAGNOSIS — Z48812 Encounter for surgical aftercare following surgery on the circulatory system: Secondary | ICD-10-CM

## 2013-02-11 NOTE — Progress Notes (Signed)
The patient comes back today for followup. He is a 52 year old gentleman who presented to the hospital with gangrenous changes to his foot. He underwent multiple amputations of digits by Dr. Magnus Ivan. On 12/07/2012, he underwent left below knee popliteal artery to posterior tibial artery bypass graft with ipsilateral reversed greater saphenous vein. This vein was a little sclerotic although on the external side it looked to be an adequate vein. I elected to place him on Xaralto in hopes that his graft would remain patent. At his last visit he had some separation over his vein harvest incisions as well as the posterior tibial artery anastomosis. I felt this was likely secondary to swelling. I elected to treat this with wet-to-dry dressing changes. He has also been referred to the wound center.  On examination his wounds have essentially healed over the bypass graft. He still has a residual open area from his amputation site. I think he is in a dramatic progress in his healing. He has a multiphasic Doppler signal within his bypass graft.  I will plan on having him back in 6 weeks. At that time he will get a duplex of his bypass graft. He continues to be taking anticoagulation medications. I told him today I like him to stay on that at least until his wounds are completely healed

## 2013-02-12 LAB — GLUCOSE, CAPILLARY: Glucose-Capillary: 270 mg/dL — ABNORMAL HIGH (ref 70–99)

## 2013-02-13 ENCOUNTER — Other Ambulatory Visit: Payer: Self-pay | Admitting: *Deleted

## 2013-02-13 DIAGNOSIS — I739 Peripheral vascular disease, unspecified: Secondary | ICD-10-CM

## 2013-02-13 DIAGNOSIS — Z48812 Encounter for surgical aftercare following surgery on the circulatory system: Secondary | ICD-10-CM

## 2013-02-14 ENCOUNTER — Encounter (HOSPITAL_BASED_OUTPATIENT_CLINIC_OR_DEPARTMENT_OTHER): Payer: 59 | Attending: General Surgery

## 2013-02-14 DIAGNOSIS — L97309 Non-pressure chronic ulcer of unspecified ankle with unspecified severity: Secondary | ICD-10-CM | POA: Insufficient documentation

## 2013-02-14 DIAGNOSIS — Y831 Surgical operation with implant of artificial internal device as the cause of abnormal reaction of the patient, or of later complication, without mention of misadventure at the time of the procedure: Secondary | ICD-10-CM | POA: Insufficient documentation

## 2013-02-14 DIAGNOSIS — S98139A Complete traumatic amputation of one unspecified lesser toe, initial encounter: Secondary | ICD-10-CM | POA: Insufficient documentation

## 2013-02-14 DIAGNOSIS — T85698A Other mechanical complication of other specified internal prosthetic devices, implants and grafts, initial encounter: Secondary | ICD-10-CM | POA: Insufficient documentation

## 2013-02-14 DIAGNOSIS — E1169 Type 2 diabetes mellitus with other specified complication: Secondary | ICD-10-CM | POA: Insufficient documentation

## 2013-02-14 DIAGNOSIS — L97509 Non-pressure chronic ulcer of other part of unspecified foot with unspecified severity: Secondary | ICD-10-CM | POA: Insufficient documentation

## 2013-02-14 LAB — GLUCOSE, CAPILLARY: Glucose-Capillary: 296 mg/dL — ABNORMAL HIGH (ref 70–99)

## 2013-02-15 LAB — GLUCOSE, CAPILLARY
Glucose-Capillary: 357 mg/dL — ABNORMAL HIGH (ref 70–99)
Glucose-Capillary: 278 mg/dL — ABNORMAL HIGH (ref 70–99)

## 2013-02-19 LAB — GLUCOSE, CAPILLARY
Glucose-Capillary: 267 mg/dL — ABNORMAL HIGH (ref 70–99)
Glucose-Capillary: 189 mg/dL — ABNORMAL HIGH (ref 70–99)
Glucose-Capillary: 348 mg/dL — ABNORMAL HIGH (ref 70–99)

## 2013-02-20 LAB — GLUCOSE, CAPILLARY: Glucose-Capillary: 253 mg/dL — ABNORMAL HIGH (ref 70–99)

## 2013-02-25 LAB — GLUCOSE, CAPILLARY: Glucose-Capillary: 356 mg/dL — ABNORMAL HIGH (ref 70–99)

## 2013-02-26 LAB — GLUCOSE, CAPILLARY
Glucose-Capillary: 414 mg/dL — ABNORMAL HIGH (ref 70–99)
Glucose-Capillary: 336 mg/dL — ABNORMAL HIGH (ref 70–99)

## 2013-02-27 LAB — GLUCOSE, CAPILLARY: Glucose-Capillary: 307 mg/dL — ABNORMAL HIGH (ref 70–99)

## 2013-02-28 LAB — GLUCOSE, CAPILLARY: Glucose-Capillary: 340 mg/dL — ABNORMAL HIGH (ref 70–99)

## 2013-03-01 LAB — GLUCOSE, CAPILLARY: Glucose-Capillary: 313 mg/dL — ABNORMAL HIGH (ref 70–99)

## 2013-03-04 LAB — GLUCOSE, CAPILLARY: Glucose-Capillary: 301 mg/dL — ABNORMAL HIGH (ref 70–99)

## 2013-03-05 LAB — GLUCOSE, CAPILLARY
Glucose-Capillary: 279 mg/dL — ABNORMAL HIGH (ref 70–99)
Glucose-Capillary: 226 mg/dL — ABNORMAL HIGH (ref 70–99)

## 2013-03-08 LAB — GLUCOSE, CAPILLARY: Glucose-Capillary: 288 mg/dL — ABNORMAL HIGH (ref 70–99)

## 2013-03-13 NOTE — Progress Notes (Signed)
[facility]  NAME:  [name], [name] NO.:  [id number]  MEDICAL RECORD NO.:  [id number]      DATE OF BIRTH:  03-31-1961  PHYSICIAN:  Joanne Gavel, M.D.         VISIT DATE:  03/12/2013                                  OFFICE VISIT   To Whom It May Concern:  Albert [name] is a 52 year old diabetic male, who underwent completion of amputations of rays 2 through 5 for diabetes and ischemia.  The date of this operation was December 11, 2012.  There was a flap for closure, which failed and disintegrated.  He first came to the [facility] on January 15, 2013.  He has undergone 30 hyperbaric oxygen treatments. The wound has responded well.  There has been a 50% to 60% improvement with closure of part of the wound.  The wound is now clean.  He has obviously benefited from hyperbaric oxygen, but I believe that another course of treatment is indicated as his wound still measures 2.5 x 2.8 with an overhang and a pocket of unhealed tissue.     Joanne Gavel, M.D.     RA/MEDQ  D:  03/12/2013  T:  03/13/2013  Job:  [id number]

## 2013-03-14 LAB — GLUCOSE, CAPILLARY: Glucose-Capillary: 208 mg/dL — ABNORMAL HIGH (ref 70–99)

## 2013-03-18 ENCOUNTER — Encounter (HOSPITAL_BASED_OUTPATIENT_CLINIC_OR_DEPARTMENT_OTHER): Payer: 59 | Attending: General Surgery

## 2013-03-18 DIAGNOSIS — T8789 Other complications of amputation stump: Secondary | ICD-10-CM | POA: Insufficient documentation

## 2013-03-18 DIAGNOSIS — E1169 Type 2 diabetes mellitus with other specified complication: Secondary | ICD-10-CM | POA: Insufficient documentation

## 2013-03-18 DIAGNOSIS — S98139A Complete traumatic amputation of one unspecified lesser toe, initial encounter: Secondary | ICD-10-CM | POA: Insufficient documentation

## 2013-03-18 DIAGNOSIS — Y835 Amputation of limb(s) as the cause of abnormal reaction of the patient, or of later complication, without mention of misadventure at the time of the procedure: Secondary | ICD-10-CM | POA: Insufficient documentation

## 2013-03-19 LAB — GLUCOSE, CAPILLARY
Glucose-Capillary: 205 mg/dL — ABNORMAL HIGH (ref 70–99)
Glucose-Capillary: 274 mg/dL — ABNORMAL HIGH (ref 70–99)

## 2013-03-21 LAB — GLUCOSE, CAPILLARY
Glucose-Capillary: 297 mg/dL — ABNORMAL HIGH (ref 70–99)
Glucose-Capillary: 197 mg/dL — ABNORMAL HIGH (ref 70–99)

## 2013-03-22 LAB — GLUCOSE, CAPILLARY: Glucose-Capillary: 174 mg/dL — ABNORMAL HIGH (ref 70–99)

## 2013-03-26 LAB — GLUCOSE, CAPILLARY: Glucose-Capillary: 207 mg/dL — ABNORMAL HIGH (ref 70–99)

## 2013-04-02 LAB — GLUCOSE, CAPILLARY
Glucose-Capillary: 180 mg/dL — ABNORMAL HIGH (ref 70–99)
Glucose-Capillary: 133 mg/dL — ABNORMAL HIGH (ref 70–99)

## 2013-04-03 LAB — GLUCOSE, CAPILLARY
Glucose-Capillary: 218 mg/dL — ABNORMAL HIGH (ref 70–99)
Glucose-Capillary: 139 mg/dL — ABNORMAL HIGH (ref 70–99)

## 2013-04-04 LAB — GLUCOSE, CAPILLARY: Glucose-Capillary: 226 mg/dL — ABNORMAL HIGH (ref 70–99)

## 2013-04-05 ENCOUNTER — Encounter: Payer: Self-pay | Admitting: Surgery

## 2013-04-05 LAB — GLUCOSE, CAPILLARY: Glucose-Capillary: 216 mg/dL — ABNORMAL HIGH (ref 70–99)

## 2013-04-08 ENCOUNTER — Encounter (INDEPENDENT_AMBULATORY_CARE_PROVIDER_SITE_OTHER): Payer: 59

## 2013-04-08 ENCOUNTER — Encounter: Payer: Self-pay | Admitting: Surgery

## 2013-04-08 ENCOUNTER — Encounter (INDEPENDENT_AMBULATORY_CARE_PROVIDER_SITE_OTHER): Payer: 59 | Admitting: *Deleted

## 2013-04-08 ENCOUNTER — Ambulatory Visit (INDEPENDENT_AMBULATORY_CARE_PROVIDER_SITE_OTHER): Payer: 59 | Admitting: Surgery

## 2013-04-08 VITALS — BP 116/63 | HR 89 | Ht 71.0 in | Wt 260.0 lb

## 2013-04-08 DIAGNOSIS — I739 Peripheral vascular disease, unspecified: Secondary | ICD-10-CM

## 2013-04-08 DIAGNOSIS — Z48812 Encounter for surgical aftercare following surgery on the circulatory system: Secondary | ICD-10-CM

## 2013-04-08 LAB — GLUCOSE, CAPILLARY: Glucose-Capillary: 163 mg/dL — ABNORMAL HIGH (ref 70–99)

## 2013-04-08 NOTE — Progress Notes (Signed)
[name]   Patient name: Albert [name] MRN: [id number] DOB: June 17, 1961 Sex: male     Chief Complaint  Patient presents with  . Re-evaluation    6 wk f/u B[facility] duplex- wound check    HISTORY OF PRESENT ILLNESS: The patient is here for followup. He is status post left below-knee popliteal to posterior tibial artery bypass graft with ipsilateral reverse greater saphenous vein on 12/07/2012. The patient initially presented with gangrenous changes to his foot. He has undergone multiple amputation of digits by Dr. Rayburn Ma. The patient was discharged home on Xaralto, as I was concerned about his vein conduit being adequate. It was a little sclerotic when I did his operation. He continues to get wound care at the wound center. He has applegraft in place.  Past Medical History  Diagnosis Date  . Hypertension   . Diabetes mellitus without complication ~2000    last HbA1c ~9  . Hypercholesteremia   . CVA (cerebral infarction) 2011    R hand deficit  . Chronic sinusitis   . Allergic rhinitis   . Chronic cough     Past Surgical History  Procedure Laterality Date  . 4th toe amputation Left Jan. 2014    4th toe in [place]  . Tonsillectomy and adenoidectomy    . Penile prosthesis implant    . I&d extremity Left 12/08/2012    Procedure: IRRIGATION AND DEBRIDEMENT EXTREMITY;  Surgeon: Kathryne Hitch, MD;  Location: [facility] OR;  Service: Orthopedics;  Laterality: Left;  . Amputation Left 12/08/2012    Procedure: AMPUTATION DIGIT;  Surgeon: Kathryne Hitch, MD;  Location: [facility] OR;  Service: Orthopedics;  Laterality: Left;  3rd toe amputation possible 5th toe amputation  . I&d extremity Left 12/11/2012    Procedure: REPEAT I&D LEFT FOOT;  Surgeon: Kathryne Hitch, MD;  Location: [facility] OR;  Service: Orthopedics;  Laterality: Left;  . Amputation Left 12/11/2012    Procedure: Amputation of 2nd Toe;  Surgeon: Kathryne Hitch, MD;  Location: [facility] OR;   Service: Orthopedics;  Laterality: Left;  . Bypass graft popliteal to tibial Left 12/13/2012    Procedure: BYPASS GRAFT POPLITEAL TO TIBIAL;  Surgeon: Nada Libman, MD;  Location: [facility] OR;  Service: Vascular;  Laterality: Left;  Left Popliteal to Posterior Tibial Bypass Graft with reversed saphenous vein graft    History   Social History  . Marital Status: Married    Spouse Name: [name]    Number of Children: [name]  . Years of Education: [name]   Occupational History  . Not on file.   Social History Main Topics  . Smoking status: Former Games developer  . Smokeless tobacco: Never Used  . Alcohol Use: No  . Drug Use: No  . Sexually Active: Not on file   Other Topics Concern  . Not on file   Social History Narrative   Lives in [place]   Wife: [name]    Family History  Problem Relation Age of Onset  . Diabetes    . Pancreatic cancer Mother   . Diabetes Mother   . Hyperlipidemia Mother   . Breast cancer Sister   . Depression Maternal Grandmother   . Heart disease Maternal Grandmother   . Depression Maternal Grandfather   . Heart disease Maternal Grandfather   . Depression Paternal Grandmother   . Heart disease Paternal Grandmother   . Depression Paternal Grandfather   . Heart disease Paternal Grandfather     Allergies as of  04/08/2013 - Review Complete 04/08/2013  Allergen Reaction Noted  . Amoxicillin  12/07/2012    Current Outpatient Prescriptions on File Prior to Visit  Medication Sig Dispense Refill  . aspirin 81 MG tablet Take 81 mg by mouth daily.      Marland Kitchen glyBURIDE-metformin (GLUCOVANCE) 2.5-500 MG per tablet Take 1 tablet by mouth daily with breakfast.      . HYDROcodone-acetaminophen (NORCO/VICODIN) 5-325 MG per tablet Take 1 tablet by mouth every 6 (six) hours as needed for pain.      Marland Kitchen insulin aspart (NOVOLOG FLEXPEN) 100 UNIT/ML injection Inject 12 Units into the skin 3 (three) times daily. Take every time he eats per patient      . insulin detemir (LEVEMIR) 100 UNIT/ML  injection Inject 20 Units into the skin every evening.      . irbesartan-hydrochlorothiazide (AVALIDE) 300-12.5 MG per tablet Take 1 tablet by mouth daily.      . metoprolol tartrate (LOPRESSOR) 25 MG tablet Take 25 mg by mouth 2 (two) times daily.      . Nutritional Supplements (EQUATE PO) Take 1 tablet by mouth daily.      . ondansetron (ZOFRAN) 8 MG tablet Take by mouth every 8 (eight) hours as needed for nausea. For upset stomach      . Rivaroxaban (XARELTO) 20 MG TABS Take 1 tablet (20 mg total) by mouth daily with supper.  30 tablet  2  . simvastatin (ZOCOR) 40 MG tablet Take 40 mg by mouth every evening.      . collagenase (SANTYL) ointment Apply topically daily.  15 g  0  . doxycycline (VIBRA-TABS) 100 MG tablet Take 1 tablet by mouth 2 (two) times daily.      Marland Kitchen ofloxacin (FLOXIN) 0.3 % otic solution       . ONETOUCH VERIO test strip       . oxyCODONE (OXY IR/ROXICODONE) 5 MG immediate release tablet Take 1-2 tablets (5-10 mg total) by mouth every 4 (four) hours as needed.  45 tablet  0  . silver sulfADIAZINE (SILVADENE) 1 % cream Apply 1 application topically daily.      . traMADol (ULTRAM) 50 MG tablet Take 50 mg by mouth every 6 (six) hours as needed for pain. For pain       No current facility-administered medications on file prior to visit.     REVIEW OF SYSTEMS: No changes since prior visit  PHYSICAL EXAMINATION:   Vital signs are BP 116/63  Pulse 89  Ht 5\' 11"  (1.803 m)  Wt 260 lb (117.935 kg)  BMI 36.28 kg/m2  SpO2 100% General: The patient appears their stated age. HEENT:  No gross abnormalities Pulmonary:  Non labored breathing Musculoskeletal: There are no major deformities. Neurologic: No focal weakness or paresthesias are detected, Skin: He continues to have ulceration on the distal aspect of his amputation site. The incisions have completely healed. Psychiatric: The patient has normal affect. Cardiovascular: There is a regular rate and rhythm without  significant murmur appreciated. Moderate edema in the leg prevents palpation of his bypass graft   Diagnostic Studies Ultrasound was graft was reviewed today. This shows a widely patent bypass graft. Waveforms were triphasic. An ABI was not performed to do to the location of the distal anastomosis.  Assessment: Status post tibial bypass graft for limb salvage, left Plan: The patient continues to heal his wounds albeit very slow. By ultrasound, his bypass graft is widely patent. He has triphasic waveforms. Hopefully he will have enough  blood flow to heal his wound. He continues to get wound care at the wound center. He will come back in 3 months for a repeat graft scan. He will continue with anticoagulation until he has completely healed his wounds.  Jorge Ny, M.D. [facility] [place] Office: [phone] Pager:  [phone]

## 2013-04-09 ENCOUNTER — Other Ambulatory Visit: Payer: Self-pay | Admitting: *Deleted

## 2013-04-09 ENCOUNTER — Other Ambulatory Visit (HOSPITAL_COMMUNITY): Payer: Self-pay | Admitting: Family Medicine

## 2013-04-09 LAB — GLUCOSE, CAPILLARY: Glucose-Capillary: 249 mg/dL — ABNORMAL HIGH (ref 70–99)

## 2013-04-09 NOTE — Addendum Note (Signed)
Addended by: Adria Dill L on: 04/09/2013 12:55 PM   Modules accepted: Orders

## 2013-04-10 LAB — GLUCOSE, CAPILLARY
Glucose-Capillary: 240 mg/dL — ABNORMAL HIGH (ref 70–99)
Glucose-Capillary: 169 mg/dL — ABNORMAL HIGH (ref 70–99)

## 2013-04-11 ENCOUNTER — Other Ambulatory Visit (HOSPITAL_COMMUNITY): Payer: Self-pay | Admitting: Family Medicine

## 2013-04-11 ENCOUNTER — Other Ambulatory Visit: Payer: Self-pay | Admitting: *Deleted

## 2013-04-12 ENCOUNTER — Other Ambulatory Visit: Payer: Self-pay | Admitting: *Deleted

## 2013-04-12 LAB — GLUCOSE, CAPILLARY: Glucose-Capillary: 186 mg/dL — ABNORMAL HIGH (ref 70–99)

## 2013-04-16 ENCOUNTER — Encounter (HOSPITAL_BASED_OUTPATIENT_CLINIC_OR_DEPARTMENT_OTHER): Payer: 59 | Attending: General Surgery

## 2013-04-16 DIAGNOSIS — L97509 Non-pressure chronic ulcer of other part of unspecified foot with unspecified severity: Secondary | ICD-10-CM | POA: Insufficient documentation

## 2013-04-16 DIAGNOSIS — E1169 Type 2 diabetes mellitus with other specified complication: Secondary | ICD-10-CM | POA: Insufficient documentation

## 2013-04-16 DIAGNOSIS — L84 Corns and callosities: Secondary | ICD-10-CM | POA: Insufficient documentation

## 2013-04-16 LAB — GLUCOSE, CAPILLARY: Glucose-Capillary: 328 mg/dL — ABNORMAL HIGH (ref 70–99)

## 2013-04-17 LAB — GLUCOSE, CAPILLARY
Glucose-Capillary: 240 mg/dL — ABNORMAL HIGH (ref 70–99)
Glucose-Capillary: 162 mg/dL — ABNORMAL HIGH (ref 70–99)

## 2013-04-18 LAB — GLUCOSE, CAPILLARY: Glucose-Capillary: 242 mg/dL — ABNORMAL HIGH (ref 70–99)

## 2013-04-22 LAB — GLUCOSE, CAPILLARY: Glucose-Capillary: 245 mg/dL — ABNORMAL HIGH (ref 70–99)

## 2013-04-23 LAB — GLUCOSE, CAPILLARY: Glucose-Capillary: 263 mg/dL — ABNORMAL HIGH (ref 70–99)

## 2013-04-25 LAB — GLUCOSE, CAPILLARY
Glucose-Capillary: 293 mg/dL — ABNORMAL HIGH (ref 70–99)
Glucose-Capillary: 231 mg/dL — ABNORMAL HIGH (ref 70–99)

## 2013-04-26 LAB — GLUCOSE, CAPILLARY: Glucose-Capillary: 134 mg/dL — ABNORMAL HIGH (ref 70–99)

## 2013-05-02 LAB — GLUCOSE, CAPILLARY
Glucose-Capillary: 260 mg/dL — ABNORMAL HIGH (ref 70–99)
Glucose-Capillary: 195 mg/dL — ABNORMAL HIGH (ref 70–99)

## 2013-05-03 LAB — GLUCOSE, CAPILLARY: Glucose-Capillary: 199 mg/dL — ABNORMAL HIGH (ref 70–99)

## 2013-05-06 LAB — GLUCOSE, CAPILLARY: Glucose-Capillary: 147 mg/dL — ABNORMAL HIGH (ref 70–99)

## 2013-05-21 ENCOUNTER — Encounter (HOSPITAL_BASED_OUTPATIENT_CLINIC_OR_DEPARTMENT_OTHER): Payer: 59 | Attending: General Surgery

## 2013-05-21 DIAGNOSIS — E1169 Type 2 diabetes mellitus with other specified complication: Secondary | ICD-10-CM | POA: Insufficient documentation

## 2013-05-21 DIAGNOSIS — L97509 Non-pressure chronic ulcer of other part of unspecified foot with unspecified severity: Secondary | ICD-10-CM | POA: Insufficient documentation

## 2013-05-21 DIAGNOSIS — L84 Corns and callosities: Secondary | ICD-10-CM | POA: Insufficient documentation

## 2013-06-18 ENCOUNTER — Encounter (HOSPITAL_BASED_OUTPATIENT_CLINIC_OR_DEPARTMENT_OTHER): Payer: 59 | Attending: General Surgery

## 2013-06-18 DIAGNOSIS — S98139A Complete traumatic amputation of one unspecified lesser toe, initial encounter: Secondary | ICD-10-CM | POA: Insufficient documentation

## 2013-06-18 DIAGNOSIS — L97509 Non-pressure chronic ulcer of other part of unspecified foot with unspecified severity: Secondary | ICD-10-CM | POA: Insufficient documentation

## 2013-06-18 DIAGNOSIS — E1169 Type 2 diabetes mellitus with other specified complication: Secondary | ICD-10-CM | POA: Insufficient documentation

## 2013-06-18 DIAGNOSIS — S98919A Complete traumatic amputation of unspecified foot, level unspecified, initial encounter: Secondary | ICD-10-CM | POA: Insufficient documentation

## 2013-06-25 ENCOUNTER — Encounter (HOSPITAL_COMMUNITY): Payer: Self-pay | Admitting: Emergency Medicine

## 2013-06-25 ENCOUNTER — Inpatient Hospital Stay (HOSPITAL_COMMUNITY)
Admission: EM | Admit: 2013-06-25 | Discharge: 2013-07-03 | DRG: 853 | Disposition: A | Discharge status: Rehabilitation Facility | Payer: 59 | Attending: Family Medicine | Admitting: Family Medicine

## 2013-06-25 ENCOUNTER — Inpatient Hospital Stay (HOSPITAL_COMMUNITY): Payer: 59 | Admitting: Anesthesiology

## 2013-06-25 ENCOUNTER — Emergency Department (HOSPITAL_COMMUNITY): Payer: 59

## 2013-06-25 ENCOUNTER — Encounter (HOSPITAL_COMMUNITY): Payer: Self-pay | Admitting: Anesthesiology

## 2013-06-25 ENCOUNTER — Encounter (HOSPITAL_COMMUNITY)
Admission: EM | Disposition: A | Discharge status: Rehabilitation Facility | Payer: Self-pay | Source: Home / Self Care | Attending: Internal Medicine

## 2013-06-25 ENCOUNTER — Other Ambulatory Visit: Payer: Self-pay

## 2013-06-25 DIAGNOSIS — R7881 Bacteremia: Secondary | ICD-10-CM

## 2013-06-25 DIAGNOSIS — I70269 Atherosclerosis of native arteries of extremities with gangrene, unspecified extremity: Secondary | ICD-10-CM | POA: Diagnosis present

## 2013-06-25 DIAGNOSIS — Z7982 Long term (current) use of aspirin: Secondary | ICD-10-CM

## 2013-06-25 DIAGNOSIS — Z833 Family history of diabetes mellitus: Secondary | ICD-10-CM

## 2013-06-25 DIAGNOSIS — E1310 Other specified diabetes mellitus with ketoacidosis without coma: Secondary | ICD-10-CM | POA: Diagnosis present

## 2013-06-25 DIAGNOSIS — I6529 Occlusion and stenosis of unspecified carotid artery: Secondary | ICD-10-CM | POA: Diagnosis present

## 2013-06-25 DIAGNOSIS — A419 Sepsis, unspecified organism: Secondary | ICD-10-CM | POA: Diagnosis present

## 2013-06-25 DIAGNOSIS — Z79899 Other long term (current) drug therapy: Secondary | ICD-10-CM

## 2013-06-25 DIAGNOSIS — I70209 Unspecified atherosclerosis of native arteries of extremities, unspecified extremity: Secondary | ICD-10-CM

## 2013-06-25 DIAGNOSIS — I96 Gangrene, not elsewhere classified: Secondary | ICD-10-CM

## 2013-06-25 DIAGNOSIS — I69998 Other sequelae following unspecified cerebrovascular disease: Secondary | ICD-10-CM

## 2013-06-25 DIAGNOSIS — E1142 Type 2 diabetes mellitus with diabetic polyneuropathy: Secondary | ICD-10-CM | POA: Diagnosis present

## 2013-06-25 DIAGNOSIS — E1159 Type 2 diabetes mellitus with other circulatory complications: Secondary | ICD-10-CM | POA: Diagnosis present

## 2013-06-25 DIAGNOSIS — I214 Non-ST elevation (NSTEMI) myocardial infarction: Secondary | ICD-10-CM | POA: Diagnosis present

## 2013-06-25 DIAGNOSIS — I2510 Atherosclerotic heart disease of native coronary artery without angina pectoris: Secondary | ICD-10-CM | POA: Diagnosis present

## 2013-06-25 DIAGNOSIS — Z48812 Encounter for surgical aftercare following surgery on the circulatory system: Secondary | ICD-10-CM

## 2013-06-25 DIAGNOSIS — I10 Essential (primary) hypertension: Secondary | ICD-10-CM | POA: Diagnosis present

## 2013-06-25 DIAGNOSIS — E1165 Type 2 diabetes mellitus with hyperglycemia: Secondary | ICD-10-CM

## 2013-06-25 DIAGNOSIS — Z881 Allergy status to other antibiotic agents status: Secondary | ICD-10-CM

## 2013-06-25 DIAGNOSIS — Z800 Family history of malignant neoplasm of digestive organs: Secondary | ICD-10-CM

## 2013-06-25 DIAGNOSIS — R579 Shock, unspecified: Secondary | ICD-10-CM | POA: Diagnosis present

## 2013-06-25 DIAGNOSIS — G8918 Other acute postprocedural pain: Secondary | ICD-10-CM | POA: Diagnosis not present

## 2013-06-25 DIAGNOSIS — D62 Acute posthemorrhagic anemia: Secondary | ICD-10-CM | POA: Diagnosis not present

## 2013-06-25 DIAGNOSIS — E1369 Other specified diabetes mellitus with other specified complication: Secondary | ICD-10-CM | POA: Diagnosis present

## 2013-06-25 DIAGNOSIS — L97509 Non-pressure chronic ulcer of other part of unspecified foot with unspecified severity: Secondary | ICD-10-CM | POA: Diagnosis present

## 2013-06-25 DIAGNOSIS — D72829 Elevated white blood cell count, unspecified: Secondary | ICD-10-CM

## 2013-06-25 DIAGNOSIS — Z6837 Body mass index (BMI) 37.0-37.9, adult: Secondary | ICD-10-CM

## 2013-06-25 DIAGNOSIS — Z803 Family history of malignant neoplasm of breast: Secondary | ICD-10-CM

## 2013-06-25 DIAGNOSIS — Z8249 Family history of ischemic heart disease and other diseases of the circulatory system: Secondary | ICD-10-CM

## 2013-06-25 DIAGNOSIS — Z794 Long term (current) use of insulin: Secondary | ICD-10-CM

## 2013-06-25 DIAGNOSIS — R29898 Other symptoms and signs involving the musculoskeletal system: Secondary | ICD-10-CM | POA: Diagnosis present

## 2013-06-25 DIAGNOSIS — A4101 Sepsis due to Methicillin susceptible Staphylococcus aureus: Principal | ICD-10-CM | POA: Diagnosis present

## 2013-06-25 DIAGNOSIS — I739 Peripheral vascular disease, unspecified: Secondary | ICD-10-CM

## 2013-06-25 DIAGNOSIS — E876 Hypokalemia: Secondary | ICD-10-CM | POA: Diagnosis not present

## 2013-06-25 DIAGNOSIS — S98139A Complete traumatic amputation of one unspecified lesser toe, initial encounter: Secondary | ICD-10-CM

## 2013-06-25 DIAGNOSIS — N179 Acute kidney failure, unspecified: Secondary | ICD-10-CM

## 2013-06-25 DIAGNOSIS — R739 Hyperglycemia, unspecified: Secondary | ICD-10-CM

## 2013-06-25 DIAGNOSIS — L02619 Cutaneous abscess of unspecified foot: Secondary | ICD-10-CM | POA: Diagnosis present

## 2013-06-25 DIAGNOSIS — Z87891 Personal history of nicotine dependence: Secondary | ICD-10-CM

## 2013-06-25 DIAGNOSIS — IMO0002 Reserved for concepts with insufficient information to code with codable children: Secondary | ICD-10-CM | POA: Diagnosis present

## 2013-06-25 DIAGNOSIS — E785 Hyperlipidemia, unspecified: Secondary | ICD-10-CM | POA: Diagnosis present

## 2013-06-25 DIAGNOSIS — I255 Ischemic cardiomyopathy: Secondary | ICD-10-CM | POA: Diagnosis present

## 2013-06-25 DIAGNOSIS — L02611 Cutaneous abscess of right foot: Secondary | ICD-10-CM | POA: Diagnosis present

## 2013-06-25 DIAGNOSIS — E7800 Pure hypercholesterolemia, unspecified: Secondary | ICD-10-CM | POA: Diagnosis present

## 2013-06-25 DIAGNOSIS — E871 Hypo-osmolality and hyponatremia: Secondary | ICD-10-CM | POA: Diagnosis present

## 2013-06-25 DIAGNOSIS — Z818 Family history of other mental and behavioral disorders: Secondary | ICD-10-CM

## 2013-06-25 DIAGNOSIS — E669 Obesity, unspecified: Secondary | ICD-10-CM | POA: Diagnosis present

## 2013-06-25 DIAGNOSIS — E1149 Type 2 diabetes mellitus with other diabetic neurological complication: Secondary | ICD-10-CM | POA: Diagnosis present

## 2013-06-25 DIAGNOSIS — I2589 Other forms of chronic ischemic heart disease: Secondary | ICD-10-CM | POA: Diagnosis present

## 2013-06-25 HISTORY — PX: INCISION AND DRAINAGE: SHX5863

## 2013-06-25 HISTORY — DX: Adverse effect of unspecified anesthetic, initial encounter: T41.45XA

## 2013-06-25 HISTORY — DX: Other complications of anesthesia, initial encounter: T88.59XA

## 2013-06-25 HISTORY — DX: Cerebral infarction, unspecified: I63.9

## 2013-06-25 LAB — CBC WITH DIFFERENTIAL/PLATELET
Basophils Absolute: 0 10*3/uL (ref 0.0–0.1)
Basophils Relative: 0 % (ref 0–1)
Eosinophils Absolute: 0 10*3/uL (ref 0.0–0.7)
Eosinophils Relative: 0 % (ref 0–5)
HCT: 36.9 % — ABNORMAL LOW (ref 39.0–52.0)
Hemoglobin: 12.5 g/dL — ABNORMAL LOW (ref 13.0–17.0)
Lymphocytes Relative: 2 % — ABNORMAL LOW (ref 12–46)
Lymphs Abs: 0.4 10*3/uL — ABNORMAL LOW (ref 0.7–4.0)
MCH: 27.1 pg (ref 26.0–34.0)
MCHC: 33.9 g/dL (ref 30.0–36.0)
MCV: 80 fL (ref 78.0–100.0)
Monocytes Absolute: 1.2 10*3/uL — ABNORMAL HIGH (ref 0.1–1.0)
Monocytes Relative: 7 % (ref 3–12)
Neutro Abs: 15.5 10*3/uL — ABNORMAL HIGH (ref 1.7–7.7)
Neutrophils Relative %: 91 % — ABNORMAL HIGH (ref 43–77)
Platelets: 243 10*3/uL (ref 150–400)
RBC: 4.61 MIL/uL (ref 4.22–5.81)
RDW: 12.7 % (ref 11.5–15.5)
WBC: 17.1 10*3/uL — ABNORMAL HIGH (ref 4.0–10.5)

## 2013-06-25 LAB — BASIC METABOLIC PANEL
BUN: 37 mg/dL — ABNORMAL HIGH (ref 6–23)
CO2: 23 mEq/L (ref 19–32)
Calcium: 10.4 mg/dL (ref 8.4–10.5)
Calcium: 9.9 mg/dL (ref 8.4–10.5)
Chloride: 92 mEq/L — ABNORMAL LOW (ref 96–112)
Creatinine, Ser: 2.47 mg/dL — ABNORMAL HIGH (ref 0.50–1.35)
GFR calc Af Amer: 33 mL/min — ABNORMAL LOW (ref >90)
GFR calc Af Amer: 55 mL/min — ABNORMAL LOW (ref >90)
GFR calc non Af Amer: 29 mL/min — ABNORMAL LOW (ref >90)
GFR calc non Af Amer: 47 mL/min — ABNORMAL LOW (ref >90)
Glucose, Bld: 465 mg/dL — ABNORMAL HIGH (ref 70–99)
Potassium: 4.3 mEq/L (ref 3.5–5.1)
Potassium: 3.8 mEq/L (ref 3.5–5.1)
Sodium: 129 mEq/L — ABNORMAL LOW (ref 135–145)
Sodium: 131 mEq/L — ABNORMAL LOW (ref 135–145)

## 2013-06-25 LAB — GLUCOSE, CAPILLARY
Glucose-Capillary: 396 mg/dL — ABNORMAL HIGH (ref 70–99)
Glucose-Capillary: 403 mg/dL — ABNORMAL HIGH (ref 70–99)
Glucose-Capillary: 271 mg/dL — ABNORMAL HIGH (ref 70–99)
Glucose-Capillary: 235 mg/dL — ABNORMAL HIGH (ref 70–99)
Glucose-Capillary: 204 mg/dL — ABNORMAL HIGH (ref 70–99)

## 2013-06-25 LAB — HEMOGLOBIN A1C
Hgb A1c MFr Bld: 8.2 % — ABNORMAL HIGH (ref <5.7)
Mean Plasma Glucose: 189 mg/dL — ABNORMAL HIGH (ref <117)

## 2013-06-25 LAB — LACTIC ACID, PLASMA: Lactic Acid, Venous: 1.6 mmol/L (ref 0.5–2.2)

## 2013-06-25 SURGERY — INCISION AND DRAINAGE
Anesthesia: General | Site: Foot | Laterality: Right | Wound class: Dirty or Infected

## 2013-06-25 MED ORDER — ENOXAPARIN SODIUM 60 MG/0.6ML ~~LOC~~ SOLN
50.0000 mg | SUBCUTANEOUS | Status: DC
  Filled 2013-06-25: qty 0.6

## 2013-06-25 MED ORDER — ACETAMINOPHEN 10 MG/ML IV SOLN
1000.0000 mg | Freq: Once | INTRAVENOUS | Status: AC
  Administered 2013-06-25: 1000 mg via INTRAVENOUS
  Filled 2013-06-25: qty 100

## 2013-06-25 MED ORDER — ONDANSETRON HCL 4 MG/2ML IJ SOLN
4.0000 mg | Freq: Four times a day (QID) | INTRAMUSCULAR | Status: DC | PRN
  Administered 2013-06-25 – 2013-06-27 (×2): 4 mg via INTRAVENOUS
  Filled 2013-06-25 (×2): qty 2

## 2013-06-25 MED ORDER — ONDANSETRON HCL 4 MG PO TABS: 4.0000 mg | ORAL_TABLET | Freq: Four times a day (QID) | ORAL | Status: DC | PRN

## 2013-06-25 MED ORDER — DEXTROSE 50 % IV SOLN: 25.0000 mL | INTRAVENOUS | Status: DC | PRN

## 2013-06-25 MED ORDER — INSULIN REGULAR BOLUS VIA INFUSION
0.0000 [IU] | Freq: Three times a day (TID) | INTRAVENOUS | Status: DC
  Filled 2013-06-25: qty 10

## 2013-06-25 MED ORDER — ACETAMINOPHEN 650 MG RE SUPP: 650.0000 mg | Freq: Four times a day (QID) | RECTAL | Status: DC | PRN

## 2013-06-25 MED ORDER — VANCOMYCIN HCL IN DEXTROSE 1-5 GM/200ML-% IV SOLN
1000.0000 mg | INTRAVENOUS | Status: AC
  Administered 2013-06-25: 1000 mg via INTRAVENOUS
  Filled 2013-06-25: qty 200

## 2013-06-25 MED ORDER — PROPOFOL 10 MG/ML IV BOLUS
INTRAVENOUS | Status: DC | PRN
  Administered 2013-06-25: 150 mg via INTRAVENOUS
  Administered 2013-06-25: 50 mg via INTRAVENOUS

## 2013-06-25 MED ORDER — SODIUM CHLORIDE 0.9 % IV SOLN
INTRAVENOUS | Status: DC
  Administered 2013-06-25: 18:00:00 via INTRAVENOUS
  Administered 2013-06-25: 1000 mL via INTRAVENOUS

## 2013-06-25 MED ORDER — BUPIVACAINE HCL (PF) 0.5 % IJ SOLN
INTRAMUSCULAR | Status: AC
  Filled 2013-06-25: qty 30

## 2013-06-25 MED ORDER — PHENYLEPHRINE HCL 10 MG/ML IJ SOLN
INTRAMUSCULAR | Status: DC | PRN
  Administered 2013-06-25: 80 ug via INTRAVENOUS
  Administered 2013-06-25 (×2): 40 ug via INTRAVENOUS
  Administered 2013-06-25: 80 ug via INTRAVENOUS

## 2013-06-25 MED ORDER — PROMETHAZINE HCL 25 MG/ML IJ SOLN: 6.2500 mg | INTRAMUSCULAR | Status: DC | PRN

## 2013-06-25 MED ORDER — SODIUM CHLORIDE 0.9 % IR SOLN
Status: DC | PRN
  Administered 2013-06-25: 21:00:00

## 2013-06-25 MED ORDER — ZOLPIDEM TARTRATE 5 MG PO TABS
5.0000 mg | ORAL_TABLET | Freq: Every evening | ORAL | Status: DC | PRN
  Administered 2013-07-02 (×2): 5 mg via ORAL
  Filled 2013-06-25 (×2): qty 1

## 2013-06-25 MED ORDER — SODIUM CHLORIDE 0.9 % IR SOLN
Status: DC | PRN
  Administered 2013-06-25: 3000 mL

## 2013-06-25 MED ORDER — CLINDAMYCIN PHOSPHATE 900 MG/50ML IV SOLN
900.0000 mg | Freq: Four times a day (QID) | INTRAVENOUS | Status: DC
  Administered 2013-06-26 – 2013-06-29 (×14): 900 mg via INTRAVENOUS
  Filled 2013-06-25 (×20): qty 50

## 2013-06-25 MED ORDER — SODIUM CHLORIDE 0.9 % IV SOLN: INTRAVENOUS | Status: DC

## 2013-06-25 MED ORDER — SIMVASTATIN 40 MG PO TABS
40.0000 mg | ORAL_TABLET | Freq: Every evening | ORAL | Status: DC
  Administered 2013-06-26 – 2013-07-02 (×7): 40 mg via ORAL
  Filled 2013-06-25 (×10): qty 1

## 2013-06-25 MED ORDER — HYDROMORPHONE HCL PF 1 MG/ML IJ SOLN: 1.0000 mg | INTRAMUSCULAR | Status: DC | PRN

## 2013-06-25 MED ORDER — HYDROCODONE-ACETAMINOPHEN 5-325 MG PO TABS: 1.0000 | ORAL_TABLET | ORAL | Status: DC | PRN

## 2013-06-25 MED ORDER — VANCOMYCIN HCL 10 G IV SOLR
1500.0000 mg | INTRAVENOUS | Status: DC
  Administered 2013-06-26: 1500 mg via INTRAVENOUS
  Filled 2013-06-25 (×2): qty 1500

## 2013-06-25 MED ORDER — SODIUM CHLORIDE 0.9 % IV SOLN
INTRAVENOUS | Status: DC
  Administered 2013-06-25: 14:00:00 via INTRAVENOUS

## 2013-06-25 MED ORDER — SODIUM CHLORIDE 0.9 % IV BOLUS (SEPSIS)
1000.0000 mL | Freq: Once | INTRAVENOUS | Status: AC
  Administered 2013-06-26: 1000 mL via INTRAVENOUS

## 2013-06-25 MED ORDER — ENOXAPARIN SODIUM 40 MG/0.4ML ~~LOC~~ SOLN
40.0000 mg | SUBCUTANEOUS | Status: DC
  Filled 2013-06-25: qty 0.4

## 2013-06-25 MED ORDER — METOPROLOL TARTRATE 25 MG PO TABS: 25.0000 mg | ORAL_TABLET | Freq: Two times a day (BID) | ORAL | Status: DC

## 2013-06-25 MED ORDER — DEXTROSE-NACL 5-0.45 % IV SOLN
INTRAVENOUS | Status: DC
  Administered 2013-06-26: 1000 mL via INTRAVENOUS

## 2013-06-25 MED ORDER — METOPROLOL TARTRATE 25 MG PO TABS
25.0000 mg | ORAL_TABLET | Freq: Two times a day (BID) | ORAL | Status: DC
Start: 2013-06-25 — End: 2013-06-25
  Filled 2013-06-25: qty 1

## 2013-06-25 MED ORDER — FENTANYL CITRATE 0.05 MG/ML IJ SOLN
50.0000 ug | INTRAMUSCULAR | Status: DC | PRN
  Administered 2013-06-26: 100 ug via INTRAVENOUS
  Administered 2013-06-27: 75 ug via INTRAVENOUS
  Administered 2013-06-28 – 2013-06-29 (×4): 100 ug via INTRAVENOUS
  Administered 2013-06-29: 50 ug via INTRAVENOUS
  Filled 2013-06-25 (×7): qty 2

## 2013-06-25 MED ORDER — SODIUM CHLORIDE 0.9 % IV SOLN
INTRAVENOUS | Status: DC
  Administered 2013-06-25: 3.4 [IU]/h via INTRAVENOUS
  Filled 2013-06-25: qty 1

## 2013-06-25 MED ORDER — FENTANYL CITRATE 0.05 MG/ML IJ SOLN
INTRAMUSCULAR | Status: DC | PRN
  Administered 2013-06-25: 50 ug via INTRAVENOUS

## 2013-06-25 MED ORDER — MIDAZOLAM HCL 5 MG/5ML IJ SOLN
INTRAMUSCULAR | Status: DC | PRN
  Administered 2013-06-25: .5 mg via INTRAVENOUS

## 2013-06-25 MED ORDER — SODIUM CHLORIDE 0.9 % IV SOLN
INTRAVENOUS | Status: DC | PRN
  Administered 2013-06-25 (×3): via INTRAVENOUS

## 2013-06-25 MED ORDER — 0.9 % SODIUM CHLORIDE (POUR BTL) OPTIME
TOPICAL | Status: DC | PRN
  Administered 2013-06-25: 1000 mL

## 2013-06-25 MED ORDER — PIPERACILLIN-TAZOBACTAM 3.375 G IVPB
3.3750 g | Freq: Three times a day (TID) | INTRAVENOUS | Status: DC
  Administered 2013-06-25 – 2013-06-29 (×11): 3.375 g via INTRAVENOUS
  Filled 2013-06-25 (×16): qty 50

## 2013-06-25 MED ORDER — ALBUTEROL SULFATE (5 MG/ML) 0.5% IN NEBU
2.5000 mg | INHALATION_SOLUTION | RESPIRATORY_TRACT | Status: DC | PRN
  Administered 2013-07-03 (×2): 2.5 mg via RESPIRATORY_TRACT
  Filled 2013-06-25: qty 0.5

## 2013-06-25 MED ORDER — METOCLOPRAMIDE HCL 5 MG/ML IJ SOLN
INTRAMUSCULAR | Status: DC | PRN
  Administered 2013-06-25: 10 mg via INTRAVENOUS

## 2013-06-25 MED ORDER — PANTOPRAZOLE SODIUM 40 MG IV SOLR
40.0000 mg | Freq: Every day | INTRAVENOUS | Status: DC
  Administered 2013-06-26 (×2): 40 mg via INTRAVENOUS
  Filled 2013-06-25 (×3): qty 40

## 2013-06-25 MED ORDER — ASPIRIN 81 MG PO CHEW
81.0000 mg | CHEWABLE_TABLET | Freq: Every day | ORAL | Status: DC
  Administered 2013-06-26 – 2013-06-27 (×2): 81 mg via ORAL
  Filled 2013-06-25 (×4): qty 1

## 2013-06-25 MED ORDER — SODIUM CHLORIDE 0.9 % IV BOLUS (SEPSIS)
1000.0000 mL | Freq: Once | INTRAVENOUS | Status: AC
  Administered 2013-06-25: 1000 mL via INTRAVENOUS

## 2013-06-25 MED ORDER — LIDOCAINE HCL (CARDIAC) 20 MG/ML IV SOLN
INTRAVENOUS | Status: DC | PRN
  Administered 2013-06-25: 75 mg via INTRAVENOUS

## 2013-06-25 MED ORDER — PIPERACILLIN-TAZOBACTAM 3.375 G IVPB 30 MIN
3.3750 g | INTRAVENOUS | Status: AC
  Administered 2013-06-25: 3.375 g via INTRAVENOUS
  Filled 2013-06-25: qty 50

## 2013-06-25 MED ORDER — SODIUM CHLORIDE 0.9 % IV SOLN
INTRAVENOUS | Status: DC
  Administered 2013-06-25: 5.6 [IU]/h via INTRAVENOUS
  Administered 2013-06-25: 15:00:00 via INTRAVENOUS
  Filled 2013-06-25 (×2): qty 1

## 2013-06-25 MED ORDER — METOPROLOL TARTRATE 1 MG/ML IV SOLN: 5.0000 mg | Freq: Once | INTRAVENOUS | Status: DC

## 2013-06-25 MED ORDER — ONDANSETRON HCL 4 MG/2ML IJ SOLN
INTRAMUSCULAR | Status: DC | PRN
  Administered 2013-06-25: 4 mg via INTRAVENOUS

## 2013-06-25 MED ORDER — METOPROLOL TARTRATE 25 MG PO TABS
25.0000 mg | ORAL_TABLET | Freq: Two times a day (BID) | ORAL | Status: DC
  Filled 2013-06-25 (×2): qty 1

## 2013-06-25 MED ORDER — OXYCODONE HCL 5 MG PO TABS
5.0000 mg | ORAL_TABLET | ORAL | Status: DC | PRN
  Administered 2013-06-27 – 2013-07-02 (×8): 5 mg via ORAL
  Filled 2013-06-25 (×8): qty 1

## 2013-06-25 MED ORDER — VANCOMYCIN HCL 500 MG IV SOLR
500.0000 mg | INTRAVENOUS | Status: AC
  Administered 2013-06-25: 500 mg via INTRAVENOUS
  Filled 2013-06-25: qty 500

## 2013-06-25 MED ORDER — ACETAMINOPHEN 325 MG PO TABS: 650.0000 mg | ORAL_TABLET | Freq: Four times a day (QID) | ORAL | Status: DC | PRN

## 2013-06-25 MED ORDER — HYDROMORPHONE HCL PF 1 MG/ML IJ SOLN: 0.2500 mg | INTRAMUSCULAR | Status: DC | PRN

## 2013-06-25 MED ORDER — PREGABALIN 75 MG PO CAPS
75.0000 mg | ORAL_CAPSULE | Freq: Two times a day (BID) | ORAL | Status: DC
  Administered 2013-06-26 – 2013-07-03 (×14): 75 mg via ORAL
  Filled 2013-06-25 (×21): qty 1

## 2013-06-25 MED ORDER — LACTATED RINGERS IV SOLN: INTRAVENOUS | Status: DC

## 2013-06-25 SURGICAL SUPPLY — 31 items
BAG ZIPLOCK 12X15 (MISCELLANEOUS) ×2 IMPLANT
BANDAGE ESMARK 6X9 LF (GAUZE/BANDAGES/DRESSINGS) ×1 IMPLANT
BANDAGE GAUZE ELAST BULKY 4 IN (GAUZE/BANDAGES/DRESSINGS) ×4 IMPLANT
BNDG COHESIVE 4X5 TAN STRL (GAUZE/BANDAGES/DRESSINGS) ×2 IMPLANT
BNDG ESMARK 6X9 LF (GAUZE/BANDAGES/DRESSINGS) ×2
CLOTH BEACON ORANGE TIMEOUT ST (SAFETY) ×2 IMPLANT
CUFF TOURN SGL QUICK 18 (TOURNIQUET CUFF) IMPLANT
CUFF TOURN SGL QUICK 24 (TOURNIQUET CUFF)
CUFF TOURN SGL QUICK 34 (TOURNIQUET CUFF)
CUFF TRNQT CYL 24X4X40X1 (TOURNIQUET CUFF) IMPLANT
CUFF TRNQT CYL 34X4X40X1 (TOURNIQUET CUFF) IMPLANT
DRAIN PENROSE 18X1/2 LTX STRL (DRAIN) ×2 IMPLANT
DRSG PAD ABDOMINAL 8X10 ST (GAUZE/BANDAGES/DRESSINGS) ×4 IMPLANT
DURAPREP 26ML APPLICATOR (WOUND CARE) ×2 IMPLANT
ELECT REM PT RETURN 9FT ADLT (ELECTROSURGICAL) ×2
ELECTRODE REM PT RTRN 9FT ADLT (ELECTROSURGICAL) ×1 IMPLANT
GLOVE BIO SURGEON STRL SZ7.5 (GLOVE) ×2 IMPLANT
GLOVE BIOGEL PI IND STRL 8 (GLOVE) ×1 IMPLANT
GLOVE BIOGEL PI INDICATOR 8 (GLOVE) ×1
GLOVE ECLIPSE 8.0 STRL XLNG CF (GLOVE) ×2 IMPLANT
GOWN STRL REIN XL XLG (GOWN DISPOSABLE) ×2 IMPLANT
HANDPIECE INTERPULSE COAX TIP (DISPOSABLE) ×1
KIT BASIN OR (CUSTOM PROCEDURE TRAY) ×2 IMPLANT
PACK LOWER EXTREMITY WL (CUSTOM PROCEDURE TRAY) ×2 IMPLANT
PAD CAST 4YDX4 CTTN HI CHSV (CAST SUPPLIES) ×1 IMPLANT
PADDING CAST COTTON 4X4 STRL (CAST SUPPLIES) ×1
POSITIONER SURGICAL ARM (MISCELLANEOUS) ×2 IMPLANT
SET HNDPC FAN SPRY TIP SCT (DISPOSABLE) ×1 IMPLANT
SPONGE GAUZE 4X4 12PLY (GAUZE/BANDAGES/DRESSINGS) ×2 IMPLANT
SYR CONTROL 10ML LL (SYRINGE) ×2 IMPLANT
TOWEL OR 17X26 10 PK STRL BLUE (TOWEL DISPOSABLE) ×4 IMPLANT

## 2013-06-25 NOTE — Anesthesia Preprocedure Evaluation (Addendum)
Anesthesia Evaluation  Patient identified by MRN, date of birth, ID band Patient awake    Reviewed: Allergy & Precautions, H&P , NPO status , Patient's Chart, lab work & pertinent test results  Airway Mallampati: II TM Distance: >3 FB Neck ROM: Full    Dental  (+) Teeth Intact and Dental Advisory Given   Pulmonary neg pulmonary ROS,  breath sounds clear to auscultation  Pulmonary exam normal       Cardiovascular hypertension, Pt. on medications and Pt. on home beta blockers + Peripheral Vascular Disease Rhythm:Regular Rate:Tachycardia     Neuro/Psych CVA, Residual Symptoms negative neurological ROS  negative psych ROS   GI/Hepatic negative GI ROS, Neg liver ROS,   Endo/Other  diabetes, Type 2, Oral Hypoglycemic Agents and Insulin DependentMorbid obesity  Renal/GU Renal InsufficiencyRenal disease  negative genitourinary   Musculoskeletal negative musculoskeletal ROS (+)   Abdominal   Peds negative pediatric ROS (+)  Hematology negative hematology ROS (+)   Anesthesia Other Findings   Reproductive/Obstetrics                          Anesthesia Physical Anesthesia Plan  ASA: III and emergent  Anesthesia Plan: General   Post-op Pain Management:    Induction: Intravenous  Airway Management Planned: LMA  Additional Equipment:   Intra-op Plan:   Post-operative Plan: Extubation in OR  Informed Consent: I have reviewed the patients History and Physical, chart, labs and discussed the procedure including the risks, benefits and alternatives for the proposed anesthesia with the patient or authorized representative who has indicated his/her understanding and acceptance.   Dental advisory given  Plan Discussed with:   Anesthesia Plan Comments:        Anesthesia Quick Evaluation

## 2013-06-25 NOTE — Consult Note (Signed)
HPI: [name] is well known to Dr.Blackman's service. He is Diabetic with known Charcot Arthropaty of the right foot. Patient went several weeks ago to [facility] to have full contact bledsoe boot (never received) fitted and a blister was noted on the side of his foot. Patient seen at Wound care center for right foot soon there after and was treated . Reports he was told last week foot was imprving. Went to wound care center today and found that foot wound had become worse. Patient sent to ER and admitted by medicine do to his multiple medical issues. Notes increased pain in foot over the past few weeks which he attributed to Charcot foot. Reports last meal was at 3 AM this morning.   Physical exam: General patient appears flush and is actively vomiting. Appears to be early stages of sepsis . Psych: Alert and oriented x 3 Vascular: Dorsal pedal pulse 1 plus right foot calf supple and non tender. Skin: Right foot warm to touch compared to distal tib/fib. Large ulceration plantar aspect of foot with a foul smell. Large area of hematoma/ blister with serous anginous drainage plantar aspect of right foot..  Right foot: Charcot foot with Rocker bottom deformity. Left foot clean dressing intact no signs of drainage was changed earlier today at Wound Care Center.  Neurologic: Sensation grossly intact right foot at toes.   06/25/2013 *RADIOLOGY REPORT* Clinical Data: Foot abscess; diabetes mellitus RIGHT FOOT COMPLETE - 3+ VIEW Comparison: None. Findings: Frontal, oblique, lateral views were obtained. There are Charcot changes throughout the midfoot. There is fragmentation in the tarsal - metatarsal region with a divergent Lisfranc type fracture in the mid foot between the first and second metatarsals. There is extensive soft tissue air throughout the volar aspect of the foot consistent with widespread abscess. There is no well- defined osteomyelitis on this study. There is narrowing of all PIP and DIP joints.  There is pes planus. IMPRESSION: Extensive soft tissue air consistent with abscess tracking along the volar aspect of the foot somewhat diffusely but concentrated primarily at the level of the metatarsals. No overt osteomyelitis. Charcot changes with fragmentation of the mid foot region. Lisfranc type separation between the first and second metatarsals. Pes planus. Multilevel osteoarthritic change in the PIP and DIP joints. Original Report Authenticated By: Bretta Bang, M.D.  Labs: 09/09 1135 hrs.  129     Sodium   Potassium   4.3     Potassium   Chloride   92     Chloride   CO2   23     CO2   BUN   37     BUN   Creatinine, Ser   2.47     Creatinine, Ser   Calcium   10.4     Calcium   GFR calc non Af Amer   29     GFR calc non Af Amer   GFR calc Af Amer   33      GFR calc Af Amer   Glucose, Bld   465     Glucose, Bld    CBC     WBC   17.1     WBC   RBC   4.61     RBC   Hemoglobin   12.5     Hemoglobin   HCT   36.9     HCT   MCV   80.0     MCV   MCH   27.1     MCH  MCHC   33.9     MCHC   RDW   12.7     RDW   Platelets   243     Platelets    DIFFERENTIAL     Neutrophils Relative %   91     Neutrophils Relative %   Lymphocytes Relative   2     Lymphocytes Relative   Monocytes Relative   7     Monocytes Relative   Eosinophils Relative   0     Eosinophils Relative   Basophils Relative   0     Basophils Relative   Neutro Abs   15.5     Neutro Abs   Lymphs Abs   0.4     Lymphs Abs   Monocytes Absolute   1.2     Monocytes Absolute   Eosinophils Absolute   0.0     Eosinophils Absolute   Basophils Absolute   0.0     Basophils Absolute    DIABETES     Glucose, Bld   23    A/P: 52 year old male with right foot ulceration , Charcot arthropathy right foot and Diabetes mellitus uncontrolled. Patient appears septic at this point even though afebrile. WBC 17.1 heart rate in 120's. Dr.Blackman to take patient to OR tonight for I&D of right foot. Patient NPO now.

## 2013-06-25 NOTE — Brief Op Note (Signed)
06/25/2013  9:37 PM  PATIENT:  Albert [name]  52 y.o. male  PRE-OPERATIVE DIAGNOSIS:  right foot abscess  POST-OPERATIVE DIAGNOSIS:  right foot abscess  PROCEDURE:  Procedure(s): INCISION AND DRAINAGE RIGHT FOOT (Right)  SURGEON:  Surgeon(s) and Role:    * Kathryne Hitch, MD - Primary  PHYSICIAN ASSISTANT:   ASSISTANTS: none   ANESTHESIA:   general  EBL:  Total I/O In: 2000 [I.V.:2000] Out: 200 [Urine:200]  BLOOD ADMINISTERED:none  DRAINS: none   LOCAL MEDICATIONS USED:  NONE  SPECIMEN:  No Specimen  DISPOSITION OF SPECIMEN:  [name]  COUNTS:  YES  TOURNIQUET:  * No tourniquets in log *  DICTATION: .Other Dictation: Dictation Number [id number]  PLAN OF CARE: Admit to inpatient   PATIENT DISPOSITION:  PACU - guarded condition.   Delay start of Pharmacological VTE agent (>24hrs) due to surgical blood loss or risk of bleeding: no

## 2013-06-25 NOTE — Preoperative (Signed)
Beta Blockers   Reason not to administer Beta Blockers:Not Applicable 

## 2013-06-25 NOTE — ED Provider Notes (Addendum)
CSN: [id number]     Arrival date & time 06/25/13  1308 History   First MD Initiated Contact with Patient 06/25/13 [phone]     Chief Complaint  Patient presents with  . Abscess   (Consider location/radiation/quality/duration/timing/severity/associated sxs/prior Treatment) HPI  52 year old male sent from wound center for evaluation of right foot wound. Patient reports increasing swelling and size of wound to his right foot. He first noticed about a week ago. History of neuropathy and has decreased sensation at baseline. He states that his foot has been aching and "spasming" for the past several days. Past history of diabetes and he is a vasculopath. Status post femoropopliteal bypass and toe amputations to his left foot in February of this year. No fevers or chills. Some mild nausea and fatigue, otherwise no acute complaints.   Past Medical History  Diagnosis Date  . Hypertension   . Diabetes mellitus without complication ~2000    last HbA1c ~9  . Hypercholesteremia   . CVA (cerebral infarction) 2011    R hand deficit  . Chronic sinusitis   . Allergic rhinitis   . Chronic cough    Past Surgical History  Procedure Laterality Date  . 4th toe amputation Left Jan. 2014    4th toe in [place]  . Tonsillectomy and adenoidectomy    . Penile prosthesis implant    . I&d extremity Left 12/08/2012    Procedure: IRRIGATION AND DEBRIDEMENT EXTREMITY;  Surgeon: Kathryne Hitch, MD;  Location: [facility] OR;  Service: Orthopedics;  Laterality: Left;  . Amputation Left 12/08/2012    Procedure: AMPUTATION DIGIT;  Surgeon: Kathryne Hitch, MD;  Location: [facility] OR;  Service: Orthopedics;  Laterality: Left;  3rd toe amputation possible 5th toe amputation  . I&d extremity Left 12/11/2012    Procedure: REPEAT I&D LEFT FOOT;  Surgeon: Kathryne Hitch, MD;  Location: [facility] OR;  Service: Orthopedics;  Laterality: Left;  . Amputation Left 12/11/2012    Procedure: Amputation of 2nd Toe;  Surgeon: Kathryne Hitch, MD;  Location: [facility] OR;  Service: Orthopedics;  Laterality: Left;  . Bypass graft popliteal to tibial Left 12/13/2012    Procedure: BYPASS GRAFT POPLITEAL TO TIBIAL;  Surgeon: Nada Libman, MD;  Location: [facility] OR;  Service: Vascular;  Laterality: Left;  Left Popliteal to Posterior Tibial Bypass Graft with reversed saphenous vein graft   Family History  Problem Relation Age of Onset  . Diabetes    . Pancreatic cancer Mother   . Diabetes Mother   . Hyperlipidemia Mother   . Breast cancer Sister   . Depression Maternal Grandmother   . Heart disease Maternal Grandmother   . Depression Maternal Grandfather   . Heart disease Maternal Grandfather   . Depression Paternal Grandmother   . Heart disease Paternal Grandmother   . Depression Paternal Grandfather   . Heart disease Paternal Grandfather    History  Substance Use Topics  . Smoking status: Former Games developer  . Smokeless tobacco: Never Used  . Alcohol Use: No    Review of Systems  All systems reviewed and negative, other than as noted in HPI.  Allergies  Amoxicillin  Home Medications   Current Outpatient Rx  Name  Route  Sig  Dispense  Refill  . aspirin 81 MG tablet   Oral   Take 81 mg by mouth daily.         Marland Kitchen glyBURIDE-metformin (GLUCOVANCE) 2.5-500 MG per tablet   Oral   Take 1 tablet by mouth daily  with breakfast.         . insulin aspart (NOVOLOG FLEXPEN) 100 UNIT/ML injection   Subcutaneous   Inject 12 Units into the skin 3 (three) times daily. Take every time he eats per patient         . insulin detemir (LEVEMIR) 100 UNIT/ML injection   Subcutaneous   Inject 20 Units into the skin every evening.         . irbesartan-hydrochlorothiazide (AVALIDE) 300-12.5 MG per tablet   Oral   Take 1 tablet by mouth daily.         . metoprolol tartrate (LOPRESSOR) 25 MG tablet   Oral   Take 25 mg by mouth 2 (two) times daily.         . ondansetron (ZOFRAN) 8 MG tablet   Oral   Take by mouth every 8  (eight) hours as needed for nausea. For upset stomach         . pregabalin (LYRICA) 75 MG capsule   Oral   Take 75 mg by mouth 2 (two) times daily.         . Rivaroxaban (XARELTO) 20 MG TABS   Oral   Take 1 tablet (20 mg total) by mouth daily with supper.   30 tablet   2   . silver sulfADIAZINE (SILVADENE) 1 % cream   Topical   Apply 1 application topically daily.         . simvastatin (ZOCOR) 40 MG tablet   Oral   Take 40 mg by mouth every evening.          BP 103/64  Pulse 120  Temp(Src) 98.9 F (37.2 C) (Oral)  Resp 16  SpO2 98% Physical Exam  Nursing note and vitals reviewed. Constitutional: He appears well-developed and well-nourished. No distress.  HENT:  Head: Normocephalic and atraumatic.  Eyes: Conjunctivae are normal. Right eye exhibits no discharge. Left eye exhibits no discharge.  Neck: Neck supple.  Cardiovascular: Normal rate, regular rhythm and normal heart sounds.  Exam reveals no gallop and no friction rub.   No murmur heard. Pulmonary/Chest: Effort normal and breath sounds normal. No respiratory distress.  Abdominal: Soft. He exhibits no distension. There is no tenderness.  Musculoskeletal:  R foot diffusely swollen. Ulceration plantar aspect with dark serous drainage. Resistance met with probing wound. Delayed cap refill in toes. No palpable pulses DP pulse with doppler.   Neurological: He is alert.  Weakness/contractures RUE. Pt reports chronic from prior stroke.   Skin: Skin is warm and dry.  Psychiatric: He has a normal mood and affect. His behavior is normal. Thought content normal.    ED Course  Procedures (including critical care time) Labs Review Labs Reviewed  CBC WITH DIFFERENTIAL - Abnormal; Notable for the following:    WBC 17.1 (*)    Hemoglobin 12.5 (*)    HCT 36.9 (*)    Neutrophils Relative % 91 (*)    Neutro Abs 15.5 (*)    Lymphocytes Relative 2 (*)    Lymphs Abs 0.4 (*)    Monocytes Absolute 1.2 (*)    All other  components within normal limits  BASIC METABOLIC PANEL - Abnormal; Notable for the following:    Sodium 129 (*)    Chloride 92 (*)    Glucose, Bld 465 (*)    BUN 37 (*)    Creatinine, Ser 2.47 (*)    GFR calc non Af Amer 29 (*)    GFR calc Af Amer 33 (*)  All other components within normal limits   Imaging Review Dg Foot Complete Right  06/25/2013   *RADIOLOGY REPORT*  Clinical Data: Foot abscess; diabetes mellitus  RIGHT FOOT COMPLETE - 3+ VIEW  Comparison: None.  Findings: Frontal, oblique, lateral views were obtained.  There are Charcot changes throughout the midfoot.  There is fragmentation in the tarsal - metatarsal region with a divergent Lisfranc type fracture in the mid foot between the first and second metatarsals.  There is extensive soft tissue air throughout the volar aspect of the foot consistent with widespread abscess.  There is no well- defined osteomyelitis on this study.  There is narrowing of all PIP and DIP joints.  There is pes planus.  IMPRESSION: Extensive soft tissue air consistent with abscess tracking along the volar aspect of the foot somewhat diffusely but concentrated primarily at the level of the metatarsals.  No overt osteomyelitis.  Charcot changes with fragmentation of the mid foot region. Lisfranc type separation between the first and second metatarsals.  Pes planus.  Multilevel osteoarthritic change in the PIP and DIP joints.   Original Report Authenticated By: Bretta Bang, M.D.    MDM   1. Foot abscess, right   2. AKI (acute kidney injury)   3. Hyperglycemia     52yM with L foot infection. Clinically this is beyond point trial of PO abx. Known vasculopath s/p fem/pop bypass L side. Additonal hx HLD, HTN, poorly controlled diabetic. Will XR, possibly MR to eval for osteo. Ortho consult. Previous surgery by Dr Magnus Ivan. Pt requesting [facility] again. Will discuss. Doppler able R DP pulse. Vascular consult at discretion of admitting/consultants.  Anticipate medicine admit given multiple co morbidities.   11:33 AM Discussed with Dr Magnus Ivan. Familiar with pt. Actually saw him yesterday. Pt reports R foot was bandaged at that time and thought it was ok so didn't specifically mention it during the visit.   Imaging as above. Also ARF. Given Bolus. Additional IVF.  Significant hyperglycemia w/o acidosis. Will defer to admitting team in terms of insulin/further management.     Raeford Razor, MD 06/25/13 1240   Raeford Razor, MD 06/29/13 [phone]

## 2013-06-25 NOTE — Progress Notes (Signed)
Patient ID: Albert [name], male   DOB: 1960-11-11, 52 y.o.   MRN: [id number] We are proceeding to the OR tonight for an urgent irrigation and debridement of his right foot due to overwhelming infection.  I am concerned he is getting septic.  I am also concerned that he may end up losing his right foot.  I have explained this to him in detail and he agrees to proceed to the OR tonight.

## 2013-06-25 NOTE — ED Notes (Signed)
Pt was going into the wound center today and they found that he has an abscess on the bottom of his right foot.  Was going to wound center because he had 4 toes amputated on the left foot.

## 2013-06-25 NOTE — Plan of Care (Cosign Needed)
CTSP re: hypotension, tachypnea and tachycardia post op- known foot abscess post extensive debridement this PM. SBP 89 w/MAP 65- HR 130's but ST. Has already received 3L IVF. Likely physiologic due to sepsis- considered 1x dose Lopressor to see if BP will improve if can decrease HR but PCCM rec since not AF/flutter would not suppress normal physiologic response and agrees with add'l aggressive hydration. Have dc'd PO Lopressor for now. Na+ low in setting of DH and hyperglycemia. Will check PCT and lactic acid.Concerned may require pressors so have consulted PCCM to see.  Junious Silk, ANP

## 2013-06-25 NOTE — Anesthesia Postprocedure Evaluation (Signed)
  Anesthesia Post-op Note  Patient: Albert [name]  Procedure(s) Performed: Procedure(s): INCISION AND DRAINAGE RIGHT FOOT (Right)  Patient Location: PACU  Anesthesia Type:General  Level of Consciousness: awake, alert , oriented, patient cooperative and responds to stimulation  Airway and Oxygen Therapy: Patient Spontanous Breathing and Patient connected to nasal cannula oxygen  Post-op Pain: none  Post-op Assessment: Post-op Vital signs reviewed, Respiratory Function Stable, Patent Airway, No signs of Nausea or vomiting and Pain level controlled  Post-op Vital Signs: Reviewed and stable  Complications: No apparent anesthesia complications

## 2013-06-25 NOTE — Consult Note (Addendum)
PULMONARY  / CRITICAL CARE MEDICINE  Name: Albert [name] MRN: [id number] DOB: 1961-01-17    ADMISSION DATE:  06/25/2013 CONSULTATION DATE:  06/25/2013  REFERRING MD :  [facility] PRIMARY SERVICE:  PCCM  CHIEF COMPLAINT:  Sepsis  BRIEF PATIENT DESCRIPTION: 52 yo with poorly controled DM and PVD admitted with right foot abscess who underwent I&D and was brought to ICU hypotensive and tachycardic.  Sepsis suspected.  PCCM asked to evaluate.  SIGNIFICANT EVENTS / STUDIES:  9/9  R foot XR >>> Extensive soft tissue air consistent with abscess tracking along the volar aspect of the foot somewhat diffusely but concentrated primarily at the level of the metatarsals. 9/9  OR >>> I&D of R foot abscess   LINES / TUBES: Foley 9/9 >>>  CULTURES: 9/9 Blood >>>  ANTIBIOTICS: Zosyn 9/9 >>> Vancomycin 9/9 >>> Clindamycin 9/9 >>>  HISTORY OF PRESENT ILLNESS:  52 yo with poorly controlled DM (s/p multiple toe amputations L foot) and PVD (s/p L pop-tib bypass) sent from wounds care center for evaluation of R foot wound, which started as a blister about two weeks prior to admission.  Since 5 days ago he developed throbbing R foot pain, subjective fever and chills.  In ID noted to be dehydrated, tachycardic, hypotensive, in DKA, with R foot abscess.  Underwent R foot abscess I&D.  PAST MEDICAL HISTORY :  Past Medical History  Diagnosis Date  . Hypertension   . Diabetes mellitus without complication ~2000    last HbA1c ~9  . Hypercholesteremia   . CVA (cerebral infarction) 2011    R hand deficit  . Chronic sinusitis   . Allergic rhinitis   . Chronic cough   . Complication of anesthesia     couldn't swallow and talk   Past Surgical History  Procedure Laterality Date  . 4th toe amputation Left Jan. 2014    4th toe in [place]  . Tonsillectomy and adenoidectomy    . Penile prosthesis implant    . I&d extremity Left 12/08/2012    Procedure: IRRIGATION AND DEBRIDEMENT EXTREMITY;  Surgeon: Kathryne Hitch, MD;  Location: [facility] OR;  Service: Orthopedics;  Laterality: Left;  . Amputation Left 12/08/2012    Procedure: AMPUTATION DIGIT;  Surgeon: Kathryne Hitch, MD;  Location: [facility] OR;  Service: Orthopedics;  Laterality: Left;  3rd toe amputation possible 5th toe amputation  . I&d extremity Left 12/11/2012    Procedure: REPEAT I&D LEFT FOOT;  Surgeon: Kathryne Hitch, MD;  Location[facility] OR;  Service: Orthopedics;  Laterality: Left;  . Amputation Left 12/11/2012    Procedure: Amputation of 2nd Toe;  Surgeon: Kathryne Hitch, MD;  Location: [facility] OR;  Service: Orthopedics;  Laterality: Left;  . Bypass graft popliteal to tibial Left 12/13/2012    Procedure: BYPASS GRAFT POPLITEAL TO TIBIAL;  Surgeon: Nada Libman, MD;  Location: [facility] OR;  Service: Vascular;  Laterality: Left;  Left Popliteal to Posterior Tibial Bypass Graft with reversed saphenous vein graft   Prior to Admission medications   Medication Sig Start Date End Date Taking? Authorizing Provider  aspirin 81 MG tablet Take 81 mg by mouth daily.   Yes Historical Provider, MD  glyBURIDE-metformin (GLUCOVANCE) 2.5-500 MG per tablet Take 1 tablet by mouth daily with breakfast.   Yes Historical Provider, MD  insulin aspart (NOVOLOG FLEXPEN) 100 UNIT/ML injection Inject 12 Units into the skin 3 (three) times daily. Take every time he eats per patient   Yes Historical Provider, MD  insulin detemir (LEVEMIR) 100 UNIT/ML injection Inject 20 Units into the skin every evening.   Yes Historical Provider, MD  irbesartan-hydrochlorothiazide (AVALIDE) 300-12.5 MG per tablet Take 1 tablet by mouth daily.   Yes Historical Provider, MD  metoprolol tartrate (LOPRESSOR) 25 MG tablet Take 25 mg by mouth 2 (two) times daily.   Yes Historical Provider, MD  ondansetron (ZOFRAN) 8 MG tablet Take by mouth every 8 (eight) hours as needed for nausea. For upset stomach   Yes Historical Provider, MD  pregabalin (LYRICA) 75 MG capsule Take 75 mg by mouth 2  (two) times daily.   Yes Historical Provider, MD  Rivaroxaban (XARELTO) 20 MG TABS Take 1 tablet (20 mg total) by mouth daily with supper. 12/17/12  Yes Bryan R Hess, DO  silver sulfADIAZINE (SILVADENE) 1 % cream Apply 1 application topically daily. 12/24/12  Yes Historical Provider, MD  simvastatin (ZOCOR) 40 MG tablet Take 40 mg by mouth every evening.   Yes Historical Provider, MD   Allergies  Allergen Reactions  . Amoxicillin     vomiting   FAMILY HISTORY:  Family History  Problem Relation Age of Onset  . Diabetes    . Pancreatic cancer Mother   . Diabetes Mother   . Hyperlipidemia Mother   . Breast cancer Sister   . Depression Maternal Grandmother   . Heart disease Maternal Grandmother   . Depression Maternal Grandfather   . Heart disease Maternal Grandfather   . Depression Paternal Grandmother   . Heart disease Paternal Grandmother   . Depression Paternal Grandfather   . Heart disease Paternal Grandfather    SOCIAL HISTORY:  reports that he has quit smoking. He has never used smokeless tobacco. He reports that he does not drink alcohol or use illicit drugs.  REVIEW OF SYSTEMS:  As per HPI.  Also reports fatigue and excessive thirst.  Otherwise negative.  INTERVAL HISTORY:  VITAL SIGNS: Temp:  [98.3 F (36.8 C)-101.9 F (38.8 C)] 100.9 F (38.3 C) (09/09 2240) Pulse Rate:  [120-141] 138 (09/09 2240) Resp:  [16-31] 31 (09/09 2240) BP: (89-128)/(57-89) 89/57 mmHg (09/09 2240) SpO2:  [91 %-100 %] 91 % (09/09 2240) Weight:  [112 kg (246 lb 14.6 oz)-116 kg (255 lb 11.7 oz)] 116 kg (255 lb 11.7 oz) (09/09 2240)  HEMODYNAMICS:   VENTILATOR SETTINGS:   INTAKE / OUTPUT: Intake/Output     09/09 0701 - 09/10 0700   I.V. (mL/kg) 3200 (27.6)   Total Intake(mL/kg) 3200 (27.6)   Urine (mL/kg/hr) 825   Total Output 825   Net +2375        PHYSICAL EXAMINATION: General:  Appears acutely ill Neuro:  Sleepy but arouses to voice, answers appropriatelly HEENT:  PERRL, dry  membranes Cardiovascular:  Tachycardic, regular Lungs:  Bilateral diminished air entry, no w/r/r Abdomen:  Soft, mild generalized tenderness, bowel sounds diminished Musculoskeletal:  Moves all extremities, surgical dressing on feet bilaterally  Skin:  Intact  LABS:  Recent Labs Lab 06/25/13 1135 06/25/13 1811  HGB 12.5*  --   WBC 17.1*  --   PLT 243  --   NA 129* 131*  K 4.3 3.8  CL 92* 97  CO2 23 21  GLUCOSE 465* 307*  BUN 37* 32*  CREATININE 2.47* 1.64*  CALCIUM 10.4 9.9    Recent Labs Lab 06/25/13 1604 06/25/13 1715 06/25/13 1821 06/25/13 1928 06/25/13 2149  GLUCAP 403* 298* 271* 235* 204*   CXR:    ASSESSMENT / PLAN:  PULMONARY A:  No active issues. P:   Gaol SpO2>92 Supplemental oxygen PRN  CARDIOVASCULAR A: Sepsis secondary to soft tissue infection.  Sinus tachycardia, normal physiologic response to fever / pain / hypovolemia.  Hypotension in setting of sepsis and severe dehydration. P:  Goal MAP>60 Place A-line Trend troponin / lactate No indication for CVL / vasopressors at this time ASA / Zocor Would NOT treat sinus tachycardia in this setting  RENAL A:  AKI. Severe dehydration in setting of glucosuria / poor intake / losses with fever. P:   Trend BMP NS 1000 x 2 additionally IVF per DKA protocol  GASTROINTESTINAL A:  No active issues. P:   Clears Add Protonix for GI Px  HEMATOLOGIC A:  Mild anemia. P:  Trend CBC Lovenox for DVT Px Preadmission Xarelto when able  INFECTIOUS A:  R foot abscess in setting of DM, post debridement.   P:   Cultures and antibiotics as above PCT Clindamycin added to prevent toxin formation if necrotizing fasciitis  ENDOCRINE  A:  DM.  Hyperglycemia. DKA. Unknown adrenal function. P:   DKA protocol Cortisol level  NEUROLOGIC A:  Diabetic neuropathy.  Post op pain. P:   Fentanyl / Oxycodone PRN Lyrica  I have personally obtained a history, examined the patient, evaluated laboratory and  imaging results, formulated the assessment and plan and placed orders.  CRITICAL CARE:  The patient is critically ill with multiple organ systems failure and requires high complexity decision making for assessment and support, frequent evaluation and titration of therapies, application of advanced monitoring technologies and extensive interpretation of multiple databases. Critical Care Time devoted to patient care services described in this note is 40 minutes.   Lonia Farber, MD Pulmonary and Critical Care Medicine [facility] Pager: [phone]  06/25/2013, 11:20 PM

## 2013-06-25 NOTE — ED Notes (Signed)
MADE AWARE GLUCO-STABILIZER PENDING

## 2013-06-25 NOTE — Progress Notes (Signed)
Pharmacy - Lovenox Brief Note: Note plans to take patient to OR for I&D tonight.  Currently patient with an order for Lovenox 50 mg sq q24h for VTE prohylaxis to be given @ 22:00.   Have rescheduled time of Lovenox to 10:00 on 06/26/13 due to plan for surgery.  Terrilee Files, PharmD 06/25/13

## 2013-06-25 NOTE — H&P (Signed)
[facility] History and Physical  ALCIDES NUTTING [name]:[id number] DOB: 05-29-1961 DOA: 06/25/2013  Referring physician: Dr. Raeford Razor, EDP PCP: [name], MD  Outpatient Specialists:  1. Orthopedics: Dr. Doneen Poisson 2. Vascular Surgery: Dr. Coral Else. 3. Wound Care Center: Dr. Joanne Gavel.  Chief Complaint: Right foot pain  HPI: Albert [name] is a 52 y.o. male with extensive PMH-uncontrolled type II DM with peripheral neuropathy, Charcot arthropathy, HTN, CVA with residual right upper extremity weakness, hyperlipidemia, multiple I&D's and amputation of multiple toes of left foot, peripheral vascular disease, left popliteal tibial bypass graft, was sent from the wound care center to the ED on 06/25/13 for evaluation of right foot wound. Patient states that he's been followed regularly at the wound care center for left foot wounds post amputation which have almost healed a couple of weeks ago. Approximately 2 weeks ago, patient went to get orthotic boots fitted for right foot when the technician noticed blister on his right sole. He was then seen by wound care center , had I&D and the left foot was wrapped in a dressing. He was followed a week later and was advised that the wound was looking better and the dressing was changed. For the last 4-5 days patient has experienced intermittent spasms/throbbing pain rated as 7/10 in severity, nonradiating associated with fevers and chills (did not check temperature but states low-grade). He attributed the pains to his Charcot joint. He even went to see the orthopedics M.D. for work clearance letter on 9/8 but stated that his right foot was feeling okay. He denies noticing any swelling, drainage or foul smell. Today he returned to the wound care center indicated that the right foot wound looked worse and he was sent to the ED. In the ED, afebrile, mildly tachycardic at 120 beats per minute, sodium 129, glucose 465, creatinine 2.47 and WBC 17  K. no DKA. X-ray of the right foot suggestive of abscess. Orthopedics was consulted by EDP and hospitalist admission was requested. Patient had been on Xarelto which apparently was placed for venous graft patency. He has run out of this medication a week ago. Patient claims compliance to his home medications including insulins. He states that his CBGs at home were fasting in the 120-140 range and postprandial in the 200 range.   Review of Systems:   Past Medical History  Diagnosis Date  . Hypertension   . Diabetes mellitus without complication ~2000    last HbA1c ~9  . Hypercholesteremia   . CVA (cerebral infarction) 2011    R hand deficit  . Chronic sinusitis   . Allergic rhinitis   . Chronic cough   . Complication of anesthesia     couldn't swallow and talk   Past Surgical History  Procedure Laterality Date  . 4th toe amputation Left Jan. 2014    4th toe in [place]  . Tonsillectomy and adenoidectomy    . Penile prosthesis implant    . I&d extremity Left 12/08/2012    Procedure: IRRIGATION AND DEBRIDEMENT EXTREMITY;  Surgeon: Kathryne Hitch, MD;  Location: [facility] OR;  Service: Orthopedics;  Laterality: Left;  . Amputation Left 12/08/2012    Procedure: AMPUTATION DIGIT;  Surgeon: Kathryne Hitch, MD;  Location: [facility] OR;  Service: Orthopedics;  Laterality: Left;  3rd toe amputation possible 5th toe amputation  . I&d extremity Left 12/11/2012    Procedure: REPEAT I&D LEFT FOOT;  Surgeon: Kathryne Hitch, MD;  Location: [facility] OR;  Service: Orthopedics;  Laterality:  Left;  . Amputation Left 12/11/2012    Procedure: Amputation of 2nd Toe;  Surgeon: Kathryne Hitch, MD;  Location: [facility] OR;  Service: Orthopedics;  Laterality: Left;  . Bypass graft popliteal to tibial Left 12/13/2012    Procedure: BYPASS GRAFT POPLITEAL TO TIBIAL;  Surgeon: Nada Libman, MD;  Location: [facility] OR;  Service: Vascular;  Laterality: Left;  Left Popliteal to Posterior Tibial Bypass Graft with reversed  saphenous vein graft   Social History:  reports that he has quit smoking. He has never used smokeless tobacco. He reports that he does not drink alcohol or use illicit drugs. Married. Lives with spouse. Independent of activities of daily living. Has not worked for almost 7-8 months.  Allergies  Allergen Reactions  . Amoxicillin     vomiting    Family History  Problem Relation Age of Onset  . Diabetes    . Pancreatic cancer Mother   . Diabetes Mother   . Hyperlipidemia Mother   . Breast cancer Sister   . Depression Maternal Grandmother   . Heart disease Maternal Grandmother   . Depression Maternal Grandfather   . Heart disease Maternal Grandfather   . Depression Paternal Grandmother   . Heart disease Paternal Grandmother   . Depression Paternal Grandfather   . Heart disease Paternal Grandfather     Prior to Admission medications   Medication Sig Start Date End Date Taking? Authorizing Provider  aspirin 81 MG tablet Take 81 mg by mouth daily.   Yes Historical Provider, MD  glyBURIDE-metformin (GLUCOVANCE) 2.5-500 MG per tablet Take 1 tablet by mouth daily with breakfast.   Yes Historical Provider, MD  insulin aspart (NOVOLOG FLEXPEN) 100 UNIT/ML injection Inject 12 Units into the skin 3 (three) times daily. Take every time he eats per patient   Yes Historical Provider, MD  insulin detemir (LEVEMIR) 100 UNIT/ML injection Inject 20 Units into the skin every evening.   Yes Historical Provider, MD  irbesartan-hydrochlorothiazide (AVALIDE) 300-12.5 MG per tablet Take 1 tablet by mouth daily.   Yes Historical Provider, MD  metoprolol tartrate (LOPRESSOR) 25 MG tablet Take 25 mg by mouth 2 (two) times daily.   Yes Historical Provider, MD  ondansetron (ZOFRAN) 8 MG tablet Take by mouth every 8 (eight) hours as needed for nausea. For upset stomach   Yes Historical Provider, MD  pregabalin (LYRICA) 75 MG capsule Take 75 mg by mouth 2 (two) times daily.   Yes Historical Provider, MD   Rivaroxaban (XARELTO) 20 MG TABS Take 1 tablet (20 mg total) by mouth daily with supper. 12/17/12  Yes Bryan R Hess, DO  silver sulfADIAZINE (SILVADENE) 1 % cream Apply 1 application topically daily. 12/24/12  Yes Historical Provider, MD  simvastatin (ZOCOR) 40 MG tablet Take 40 mg by mouth every evening.   Yes Historical Provider, MD   Physical Exam: Filed Vitals:   06/25/13 0940 06/25/13 1327 06/25/13 1400 06/25/13 1411  BP: 103/64 120/64  128/81  Pulse: 120 128  122  Temp: 98.9 F (37.2 C) 98.6 F (37 C)  98.3 F (36.8 C)  TempSrc: Oral Oral  Oral  Resp: 16 16  18   Height:   5\' 11"  (1.803 m)   Weight:   112 kg (246 lb 14.6 oz)   SpO2: 98% 100%  100%     General exam: Moderately built and nourished male patient, lying comfortably supine in bed in no obvious distress. Does not look septic or toxic.  Head, eyes and ENT: Nontraumatic and  normocephalic. Pupils equally reacting to light and accommodation. Oral mucosa dry.  Neck: Supple. No JVD, carotid bruit or thyromegaly.  Lymphatics: No lymphadenopathy.  Respiratory system: Clear to auscultation. No increased work of breathing.  Cardiovascular system: S1 and S2 heard, regular and mild tachycardia. No JVD, murmurs, gallops, clicks or pedal edema.  Gastrointestinal system: Abdomen is nondistended, soft and nontender. Normal bowel sounds heard. No organomegaly or masses appreciated.  Central nervous system: Alert and oriented. No focal neurological deficits.  Extremities: Right upper extremity muscle wasting in all groups, grade 4/5 power. Left foot dressing changed at the wound care center today and hence not removed for exam-clean and dry. Right foot deformed at the ankle & midfoot, mild swelling below the lower one third leg with associated mild increased in warmth but no tenderness. Extensive soft boggy swelling in the mid plantar aspect of foot which has a superficial 3 x 5 ulcer. Fluctuation + +. Minimal serous sanguinous  drainage. Foul order. No crepitus. Diminished posterior tibial and dorsalis pedis pulsation in right foot.  Skin: As above.  Musculoskeletal system: Negative exam.  Psychiatry: Pleasant and cooperative.   Labs on Admission:  Basic Metabolic Panel:  Recent Labs Lab 06/25/13 1135  NA 129*  K 4.3  CL 92*  CO2 23  GLUCOSE 465*  BUN 37*  CREATININE 2.47*  CALCIUM 10.4   Liver Function Tests: No results found for this basename: AST, ALT, ALKPHOS, BILITOT, PROT, ALBUMIN,  in the last 168 hours No results found for this basename: LIPASE, AMYLASE,  in the last 168 hours No results found for this basename: AMMONIA,  in the last 168 hours CBC:  Recent Labs Lab 06/25/13 1135  WBC 17.1*  NEUTROABS 15.5*  HGB 12.5*  HCT 36.9*  MCV 80.0  PLT 243   Cardiac Enzymes: No results found for this basename: CKTOTAL, CKMB, CKMBINDEX, TROPONINI,  in the last 168 hours  BNP (last 3 results) No results found for this basename: PROBNP,  in the last 8760 hours CBG:  Recent Labs Lab 06/25/13 0853 06/25/13 1330 06/25/13 1351 06/25/13 1452  GLUCAP 415* 396* 400* 437*    Radiological Exams on Admission: Dg Foot Complete Right  06/25/2013   *RADIOLOGY REPORT*  Clinical Data: Foot abscess; diabetes mellitus  RIGHT FOOT COMPLETE - 3+ VIEW  Comparison: None.  Findings: Frontal, oblique, lateral views were obtained.  There are Charcot changes throughout the midfoot.  There is fragmentation in the tarsal - metatarsal region with a divergent Lisfranc type fracture in the mid foot between the first and second metatarsals.  There is extensive soft tissue air throughout the volar aspect of the foot consistent with widespread abscess.  There is no well- defined osteomyelitis on this study.  There is narrowing of all PIP and DIP joints.  There is pes planus.  IMPRESSION: Extensive soft tissue air consistent with abscess tracking along the volar aspect of the foot somewhat diffusely but concentrated  primarily at the level of the metatarsals.  No overt osteomyelitis.  Charcot changes with fragmentation of the mid foot region. Lisfranc type separation between the first and second metatarsals.  Pes planus.  Multilevel osteoarthritic change in the PIP and DIP joints.   Original Report Authenticated By: Bretta Bang, M.D.     Assessment/Plan Principal Problem:   Foot abscess, right Active Problems:   Hypercholesteremia   Diabetes mellitus out of control   Essential hypertension, benign   Atherosclerotic peripheral vascular disease   AKI (acute kidney injury)  Sepsis   Dehydration with hyponatremia   Leukocytosis, unspecified   Right foot abscess/Charcot joint/PAD - Orthopedics has been consulted. Discussed with Dr. Criss Alvine possible surgery 06/26/13 after medically stabilized from hyperglycemia, dehydration and acute renal failure. - Continue IV Zosyn and vancomycin per pharmacy.  Sepsis, early - Secondary to right foot abscess. - Aggressive IV fluid hydration & IV antibiotics as above. - Mild sinus tachycardia which may also be due not taking his beta blockers today. Otherwise hemodynamically stable.  Uncontrolled type II DM/peripheral neuropathy - No DKA - Hold oral medications. - Aggressive IV fluid hydration - Placed on IV insulin drip and should probably continue same through surgery. - Until patient's source of sepsis i.e. right foot abscess is definitively addressed by I&D, it will be difficult to adequately control his hyperglycemia with basal & NovoLog insulins. - A1c in February was 9. We'll repeat. - Continue Lyrica.  Dehydration with hyponatremia - Some of the low sodium is from pseudohyponatremia. - Aggressive hydration with IV normal saline and follow BMP.  Acute renal failure - Secondary to dehydration & ARB - Hold ARB, aggressively hydrate and closely follow BMP.  Hypertension - Hold ARB secondary to acute renal failure. - Continue  beta blockers  PAD/status post left popliteal tibial bypass graft - Was on Xarelto to maintain graft patency but has not taken this in a week's time. - After discussing with Dr. Magnus Ivan, continue to hold Xarelto and place on Lovenox for DVT prophylaxis.  Hyperlipidemia - Continue Zocor.    Code Status: Full  Family Communication: None  Disposition Plan: Home when medically stable.   Time spent: 70 minutes  [facility] Pager [phone]  If 7PM-7AM, please contact night-coverage [email or web address] Password [facility] 06/25/2013, 3:42 PM

## 2013-06-25 NOTE — Progress Notes (Signed)
ANTIBIOTIC CONSULT NOTE - INITIAL  Pharmacy Consult for Vancomycin & Zosyn Indication: Right foot abscess  Allergies  Allergen Reactions  . Amoxicillin     vomiting    Patient Measurements: Height: 5\' 11"  (180.3 cm) Weight: 246 lb 14.6 oz (112 kg) IBW/kg (Calculated) : 75.3  Vital Signs: Temp: 98.3 F (36.8 C) (09/09 1411) Temp src: Oral (09/09 1411) BP: 128/81 mmHg (09/09 1411) Pulse Rate: 122 (09/09 1411) Intake/Output from previous day:   Intake/Output from this shift: Total I/O In: 100 [I.V.:100] Out: -   Labs:  Recent Labs  06/25/13 1135  WBC 17.1*  HGB 12.5*  PLT 243  CREATININE 2.47*   Estimated Creatinine Clearance: 45.5 ml/min (by C-G formula based on Cr of 2.47). No results found for this basename: VANCOTROUGH, VANCOPEAK, VANCORANDOM, GENTTROUGH, GENTPEAK, GENTRANDOM, TOBRATROUGH, TOBRAPEAK, TOBRARND, AMIKACINPEAK, AMIKACINTROU, AMIKACIN,  in the last 72 hours   Microbiology: No results found for this or any previous visit (from the past 720 hour(s)).  Medical History: Past Medical History  Diagnosis Date  . Hypertension   . Diabetes mellitus without complication ~2000    last HbA1c ~9  . Hypercholesteremia   . CVA (cerebral infarction) 2011    R hand deficit  . Chronic sinusitis   . Allergic rhinitis   . Chronic cough   . Complication of anesthesia     couldn't swallow and talk    Medications:  Scheduled:  . aspirin  81 mg Oral Daily  . enoxaparin (LOVENOX) injection  50 mg Subcutaneous Q24H  . insulin regular  0-10 Units Intravenous TID WC  . metoprolol tartrate  25 mg Oral BID  . pregabalin  75 mg Oral BID  . simvastatin  40 mg Oral QPM   Infusions:  . sodium chloride    . dextrose 5 % and 0.45% NaCl    . insulin (NOVOLIN-R) infusion     Assessment:  52 yr male with DM and h/o tibial-popliteal bypass and toe amputations to left foot. Presents with right foot wound which has been under care of wound care center.  Sent to ED for  evaluation  AF, WBC = 17.1, Scr =2.47 with CrCl (n) = 36 ml/min  Received Vancomycin 1gm x 1 in ED @ 13:02 and Zosyn 3.375gm x 1 in ED @ 11:46 today  IV Vancomycin and Zosyn to continue upon admission for treatment of right foot abscess  Goal of Therapy:  Vancomycin trough level 15-20 mcg/ml  Plan:  Measure antibiotic drug levels at steady state Follow up culture results Zosyn 3.375gm IV q8h (each dose infused over 4 hrs) Give an additional Vancomycin 500mg  IV x 1 dose now (for total first dose of 1500mg ) followed by 1500mg  IV q24h  Masao Junker, Joselyn Glassman, PharmD 06/25/2013,3:49 PM

## 2013-06-25 NOTE — ED Notes (Signed)
MD at bedside. 

## 2013-06-25 NOTE — Transfer of Care (Signed)
Immediate Anesthesia Transfer of Care Note  Patient: Albert [name]  Procedure(s) Performed: Procedure(s): INCISION AND DRAINAGE RIGHT FOOT (Right)  Patient Location: PACU  Anesthesia Type:General  Level of Consciousness: awake, oriented, patient cooperative, lethargic and responds to stimulation  Airway & Oxygen Therapy: Patient Spontanous Breathing and Patient connected to face mask oxygen  Post-op Assessment: Report given to PACU RN, Post -op Vital signs reviewed and stable and Patient moving all extremities  Post vital signs: Reviewed and stable  Complications: No apparent anesthesia complications

## 2013-06-26 ENCOUNTER — Encounter (HOSPITAL_COMMUNITY): Payer: Self-pay | Admitting: Orthopaedic Surgery

## 2013-06-26 DIAGNOSIS — I214 Non-ST elevation (NSTEMI) myocardial infarction: Secondary | ICD-10-CM | POA: Diagnosis present

## 2013-06-26 DIAGNOSIS — Z48812 Encounter for surgical aftercare following surgery on the circulatory system: Secondary | ICD-10-CM

## 2013-06-26 DIAGNOSIS — I70209 Unspecified atherosclerosis of native arteries of extremities, unspecified extremity: Secondary | ICD-10-CM

## 2013-06-26 DIAGNOSIS — I369 Nonrheumatic tricuspid valve disorder, unspecified: Secondary | ICD-10-CM

## 2013-06-26 LAB — GLUCOSE, CAPILLARY
Glucose-Capillary: 161 mg/dL — ABNORMAL HIGH (ref 70–99)
Glucose-Capillary: 171 mg/dL — ABNORMAL HIGH (ref 70–99)
Glucose-Capillary: 100 mg/dL — ABNORMAL HIGH (ref 70–99)
Glucose-Capillary: 160 mg/dL — ABNORMAL HIGH (ref 70–99)
Glucose-Capillary: 187 mg/dL — ABNORMAL HIGH (ref 70–99)
Glucose-Capillary: 187 mg/dL — ABNORMAL HIGH (ref 70–99)
Glucose-Capillary: 190 mg/dL — ABNORMAL HIGH (ref 70–99)
Glucose-Capillary: 192 mg/dL — ABNORMAL HIGH (ref 70–99)
Glucose-Capillary: 197 mg/dL — ABNORMAL HIGH (ref 70–99)
Glucose-Capillary: 226 mg/dL — ABNORMAL HIGH (ref 70–99)
Glucose-Capillary: 117 mg/dL — ABNORMAL HIGH (ref 70–99)
Glucose-Capillary: 119 mg/dL — ABNORMAL HIGH (ref 70–99)
Glucose-Capillary: 143 mg/dL — ABNORMAL HIGH (ref 70–99)

## 2013-06-26 LAB — BASIC METABOLIC PANEL
BUN: 22 mg/dL (ref 6–23)
CO2: 21 mEq/L (ref 19–32)
CO2: 22 mEq/L (ref 19–32)
CO2: 22 mEq/L (ref 19–32)
Calcium: 8.8 mg/dL (ref 8.4–10.5)
Chloride: 101 mEq/L (ref 96–112)
Chloride: 94 mEq/L — ABNORMAL LOW (ref 96–112)
Chloride: 95 mEq/L — ABNORMAL LOW (ref 96–112)
Chloride: 98 mEq/L (ref 96–112)
Creatinine, Ser: 1.15 mg/dL (ref 0.50–1.35)
GFR calc Af Amer: 65 mL/min — ABNORMAL LOW (ref >90)
GFR calc Af Amer: 75 mL/min — ABNORMAL LOW (ref >90)
GFR calc Af Amer: 84 mL/min — ABNORMAL LOW (ref >90)
GFR calc non Af Amer: 56 mL/min — ABNORMAL LOW (ref >90)
GFR calc non Af Amer: 59 mL/min — ABNORMAL LOW (ref >90)
GFR calc non Af Amer: 71 mL/min — ABNORMAL LOW (ref >90)
Glucose, Bld: 211 mg/dL — ABNORMAL HIGH (ref 70–99)
Glucose, Bld: 118 mg/dL — ABNORMAL HIGH (ref 70–99)
Potassium: 3.7 mEq/L (ref 3.5–5.1)
Potassium: 3.4 mEq/L — ABNORMAL LOW (ref 3.5–5.1)
Potassium: 3.5 mEq/L (ref 3.5–5.1)
Potassium: 3.4 mEq/L — ABNORMAL LOW (ref 3.5–5.1)
Potassium: 3.6 mEq/L (ref 3.5–5.1)
Potassium: 3.5 mEq/L (ref 3.5–5.1)
Sodium: 125 mEq/L — ABNORMAL LOW (ref 135–145)
Sodium: 127 mEq/L — ABNORMAL LOW (ref 135–145)
Sodium: 130 mEq/L — ABNORMAL LOW (ref 135–145)
Sodium: 131 mEq/L — ABNORMAL LOW (ref 135–145)
Sodium: 133 mEq/L — ABNORMAL LOW (ref 135–145)
Sodium: 134 mEq/L — ABNORMAL LOW (ref 135–145)

## 2013-06-26 LAB — CBC
HCT: 32.9 % — ABNORMAL LOW (ref 39.0–52.0)
Hemoglobin: 10.8 g/dL — ABNORMAL LOW (ref 13.0–17.0)
MCV: 80.4 fL (ref 78.0–100.0)
RBC: 4.09 MIL/uL — ABNORMAL LOW (ref 4.22–5.81)
WBC: 12 10*3/uL — ABNORMAL HIGH (ref 4.0–10.5)

## 2013-06-26 LAB — LACTIC ACID, PLASMA: Lactic Acid, Venous: 1.6 mmol/L (ref 0.5–2.2)

## 2013-06-26 LAB — MAGNESIUM: Magnesium: 1.7 mg/dL (ref 1.5–2.5)

## 2013-06-26 LAB — TROPONIN I
Troponin I: 10.97 ng/mL (ref <0.30)
Troponin I: 15.24 ng/mL (ref <0.30)

## 2013-06-26 LAB — HEPARIN LEVEL (UNFRACTIONATED): Heparin Unfractionated: <0.1 IU/mL — ABNORMAL LOW (ref 0.30–0.70)

## 2013-06-26 MED ORDER — HEPARIN BOLUS VIA INFUSION
2500.0000 [IU] | Freq: Once | INTRAVENOUS | Status: AC
  Administered 2013-06-26: 2500 [IU] via INTRAVENOUS
  Filled 2013-06-26: qty 2500

## 2013-06-26 MED ORDER — HEPARIN (PORCINE) IN NACL 100-0.45 UNIT/ML-% IJ SOLN
1500.0000 [IU]/h | INTRAMUSCULAR | Status: DC
  Administered 2013-06-26 (×2): 1500 [IU]/h via INTRAVENOUS
  Filled 2013-06-26 (×3): qty 250

## 2013-06-26 MED ORDER — LIDOCAINE HCL 2 % IJ SOLN
5.0000 mL | Freq: Once | INTRAMUSCULAR | Status: AC
  Administered 2013-06-26: 100 mg via INTRADERMAL

## 2013-06-26 MED ORDER — LIDOCAINE HCL 2 % IJ SOLN
INTRAMUSCULAR | Status: AC
  Filled 2013-06-26: qty 20

## 2013-06-26 MED ORDER — INSULIN ASPART 100 UNIT/ML ~~LOC~~ SOLN
6.0000 [IU] | Freq: Three times a day (TID) | SUBCUTANEOUS | Status: DC
  Administered 2013-06-27 – 2013-07-03 (×16): 6 [IU] via SUBCUTANEOUS

## 2013-06-26 MED ORDER — SODIUM CHLORIDE 0.9 % IV SOLN
INTRAVENOUS | Status: DC
  Administered 2013-06-26 – 2013-06-27 (×2): via INTRAVENOUS

## 2013-06-26 MED ORDER — HEPARIN (PORCINE) IN NACL 100-0.45 UNIT/ML-% IJ SOLN
1200.0000 [IU]/h | INTRAMUSCULAR | Status: DC
  Administered 2013-06-26 (×2): 1200 [IU]/h via INTRAVENOUS
  Filled 2013-06-26: qty 250

## 2013-06-26 MED ORDER — INSULIN ASPART 100 UNIT/ML ~~LOC~~ SOLN
0.0000 [IU] | Freq: Three times a day (TID) | SUBCUTANEOUS | Status: DC
  Administered 2013-06-27 – 2013-06-28 (×4): 3 [IU] via SUBCUTANEOUS
  Administered 2013-06-28: 5 [IU] via SUBCUTANEOUS
  Administered 2013-06-28: 3 [IU] via SUBCUTANEOUS
  Administered 2013-06-29: 5 [IU] via SUBCUTANEOUS
  Administered 2013-06-29: 3 [IU] via SUBCUTANEOUS
  Administered 2013-06-30 – 2013-07-01 (×4): 5 [IU] via SUBCUTANEOUS
  Administered 2013-07-01: 3 [IU] via SUBCUTANEOUS
  Administered 2013-07-01 – 2013-07-02 (×2): 8 [IU] via SUBCUTANEOUS
  Administered 2013-07-02: 09:00:00 3 [IU] via SUBCUTANEOUS
  Administered 2013-07-02: 5 [IU] via SUBCUTANEOUS
  Administered 2013-07-03: 08:00:00 3 [IU] via SUBCUTANEOUS
  Administered 2013-07-03: 5 [IU] via SUBCUTANEOUS

## 2013-06-26 MED ORDER — HEPARIN BOLUS VIA INFUSION
4000.0000 [IU] | Freq: Once | INTRAVENOUS | Status: AC
  Administered 2013-06-26: 4000 [IU] via INTRAVENOUS
  Filled 2013-06-26: qty 4000

## 2013-06-26 MED ORDER — INSULIN DETEMIR 100 UNIT/ML ~~LOC~~ SOLN
20.0000 [IU] | Freq: Every day | SUBCUTANEOUS | Status: DC
  Administered 2013-06-26 – 2013-07-01 (×5): 20 [IU] via SUBCUTANEOUS
  Filled 2013-06-26 (×6): qty 0.2

## 2013-06-26 MED ORDER — METOPROLOL TARTRATE 1 MG/ML IV SOLN
5.0000 mg | INTRAVENOUS | Status: DC
  Administered 2013-06-26 – 2013-06-27 (×8): 5 mg via INTRAVENOUS
  Filled 2013-06-26 (×13): qty 5

## 2013-06-26 MED ORDER — INSULIN ASPART 100 UNIT/ML ~~LOC~~ SOLN
0.0000 [IU] | Freq: Every day | SUBCUTANEOUS | Status: DC
Start: 2013-06-26 — End: 2013-07-03
  Administered 2013-06-30: 2 [IU] via SUBCUTANEOUS
  Administered 2013-07-02: 22:00:00 3 [IU] via SUBCUTANEOUS

## 2013-06-26 MED ORDER — HEPARIN (PORCINE) IN NACL 100-0.45 UNIT/ML-% IJ SOLN
2100.0000 [IU]/h | INTRAMUSCULAR | Status: DC
  Filled 2013-06-26 (×2): qty 250

## 2013-06-26 MED ORDER — METOPROLOL TARTRATE 12.5 MG HALF TABLET
12.5000 mg | ORAL_TABLET | Freq: Two times a day (BID) | ORAL | Status: DC
  Administered 2013-06-26 (×2): 12.5 mg via ORAL
  Filled 2013-06-26 (×3): qty 1

## 2013-06-26 NOTE — Progress Notes (Signed)
Attempted right radial aline per md rx, however unsuccessful x2.  RN aware and Elink notified.

## 2013-06-26 NOTE — Progress Notes (Signed)
ANTICOAGULATION CONSULT NOTE - Follow Up Consult  Pharmacy Consult for Heparin Indication: chest pain/ACS  Allergies  Allergen Reactions  . Amoxicillin     vomiting    Patient Measurements: Height: 5\' 11"  (180.3 cm) Weight: 255 lb 11.7 oz (116 kg) IBW/kg (Calculated) : 75.3 Heparin Dosing Weight: 100kg  Vital Signs: Temp: 100.6 F (38.1 C) (09/10 0800) Temp src: Axillary (09/10 0800) BP: 114/77 mmHg (09/10 1000) Pulse Rate: 132 (09/10 0142)  Labs:  Recent Labs  06/25/13 1135 06/25/13 1811 06/26/13 0100 06/26/13 0525 06/26/13 0910  HGB 12.5*  --   --  10.8*  --   HCT 36.9*  --   --  32.9*  --   PLT 243  --   --  207  --   HEPARINUNFRC  --   --   --   --  <0.10*  CREATININE 2.47* 1.64* 1.42* 1.26 1.42*  TROPONINI  --   --  12.42* 10.97*  --     Estimated Creatinine Clearance: 80.6 ml/min (by C-G formula based on Cr of 1.42).   Medications:  Scheduled:  . aspirin  81 mg Oral Daily  . clindamycin (CLEOCIN) IV  900 mg Intravenous Q6H  . insulin regular  0-10 Units Intravenous TID WC  . lidocaine      . metoprolol tartrate  12.5 mg Oral BID  . pantoprazole (PROTONIX) IV  40 mg Intravenous QHS  . piperacillin-tazobactam (ZOSYN)  IV  3.375 g Intravenous Q8H  . pregabalin  75 mg Oral BID  . simvastatin  40 mg Oral QPM  . vancomycin  1,500 mg Intravenous Q24H   Infusions:  . sodium chloride Stopped (06/26/13 0006)  . sodium chloride    . dextrose 5 % and 0.45% NaCl 1,000 mL (06/26/13 0006)  . heparin 1,200 Units/hr (06/26/13 0225)  . insulin (NOVOLIN-R) infusion 16.6 Units/hr (06/26/13 1000)    Assessment: 52 yo with poorly controled DM and PVD admitted with right foot abscess who underwent I&D and was brought to ICU hypotensive and tachycardic, presumed sepsis. IV heparin started overnight for possible NSTEMI from demand ischemia.  First heparin level <0.1 after 4000 units bolus and 1200 units/hr infusion  No issues identified with infusion, level was  obtained correctly  CBC decreased as expected postop, no bleeding reported   Goal of Therapy:  Heparin level 0.3-0.7 units/ml Monitor platelets by anticoagulation protocol: Yes   Plan:   Rebolus heparin 2500 units IV x 1  Increase heparin infusion 1500 units/hr  Recheck heparin level in 8hrs  Daily heparin level and CBC  Loralee Pacas, PharmD, BCPS Pager: [phone] 06/26/2013,10:31 AM

## 2013-06-26 NOTE — Progress Notes (Signed)
Notified Dr. Audelia Hives of 6 consecutive CBG's within goal along with closed anion gap. Requested transition orders, advised to continue till rounding MD's arrive to proceed with Lantus and sliding scale.

## 2013-06-26 NOTE — Progress Notes (Signed)
ANTICOAGULATION CONSULT NOTE - Initial Consult  Pharmacy Consult for Heparin Indication: chest pain/ACS (+) CE  Allergies  Allergen Reactions  . Amoxicillin     vomiting    Patient Measurements: Height: 5\' 11"  (180.3 cm) Weight: 255 lb 11.7 oz (116 kg) IBW/kg (Calculated) : 75.3 Heparin Dosing Weight:   Vital Signs: Temp: 99 F (37.2 C) (09/10 0000) Temp src: Oral (09/10 0000) BP: 100/73 mmHg (09/10 0108) Pulse Rate: 132 (09/10 0142)  Labs:  Recent Labs  06/25/13 1135 06/25/13 1811 06/26/13 0100  HGB 12.5*  --   --   HCT 36.9*  --   --   PLT 243  --   --   CREATININE 2.47* 1.64* 1.42*  TROPONINI  --   --  12.42*    Estimated Creatinine Clearance: 80.6 ml/min (by C-G formula based on Cr of 1.42).   Medical History: Past Medical History  Diagnosis Date  . Hypertension   . Diabetes mellitus without complication ~2000    last HbA1c ~9  . Hypercholesteremia   . CVA (cerebral infarction) 2011    R hand deficit  . Chronic sinusitis   . Allergic rhinitis   . Chronic cough   . Complication of anesthesia     couldn't swallow and talk    Medications:  Infusions:  . sodium chloride Stopped (06/26/13 0006)  . sodium chloride    . dextrose 5 % and 0.45% NaCl 1,000 mL (06/26/13 0006)  . heparin 1,200 Units/hr (06/26/13 0225)  . insulin (NOVOLIN-R) infusion 5.3 Units/hr (06/26/13 0045)    Assessment: Patient with (+) CE.  Heparin per pharmacy ordered.  Patient with I&D on 9/9.    Goal of Therapy:  Heparin level 0.3-0.7 units/ml Monitor platelets by anticoagulation protocol: Yes   Plan:  Heparin bolus 4000 units iv x1 Heparin drip at 1200 units/hr Heparin level at 1000 Daily CBC  Aleene Davidson Crowford 06/26/2013,2:26 AM

## 2013-06-26 NOTE — Progress Notes (Signed)
eLink Physician-Brief Progress Note Patient Name: Albert [name] DOB: [birth year] MRN: [id number]  Date of Service  06/26/2013   HPI/Events of Note  Patient admitted with concern for sepsis following I&D of right foot abscess.  Now with elevated trop to 12. Has DM and HTN.   eICU Interventions  Plan: Continue daily ASA Start heparin gtt and BB as HD tolerated 2D ECHO in am Continue to cycle trop Will need cardiology consult   Intervention Category Minor Interventions: Clinical assessment - ordering diagnostic tests  Hafsa Lohn 06/26/2013, 1:56 AM

## 2013-06-26 NOTE — Progress Notes (Signed)
PULMONARY  / CRITICAL CARE MEDICINE  Name: Albert [name] MRN: [id number] DOB: 07-Oct-1961    ADMISSION DATE:  06/25/2013 CONSULTATION DATE:  06/25/2013  REFERRING MD :  [facility] PRIMARY SERVICE:  PCCM  CHIEF COMPLAINT:  Sepsis  BRIEF PATIENT DESCRIPTION: 52 yo with poorly controled DM and PVD admitted with right foot abscess who underwent I&D and was brought to ICU hypotensive and tachycardic.  Sepsis suspected.  PCCM asked to evaluate.  SIGNIFICANT EVENTS / STUDIES:  9/9  R foot XR >>> Extensive soft tissue air consistent with abscess tracking along the volar aspect of the foot somewhat diffusely but concentrated primarily at the level of the metatarsals. 9/9  OR >>> I&D of R foot abscess  9/10- trop pos, tachy  LINES / TUBES: Foley 9/9 >>>  CULTURES: 9/9 Blood >>>  ANTIBIOTICS: Zosyn 9/9 >>> Vancomycin 9/9 >>> Clindamycin 9/9 >>>  VITAL SIGNS: Temp:  [97.3 F (36.3 C)-101.9 F (38.8 C)] 97.3 F (36.3 C) (09/10 0400) Pulse Rate:  [122-141] 132 (09/10 0142) Resp:  [16-31] 30 (09/10 0600) BP: (81-151)/(48-93) 95/68 mmHg (09/10 0600) SpO2:  [90 %-100 %] 90 % (09/10 0600) Weight:  [112 kg (246 lb 14.6 oz)-116 kg (255 lb 11.7 oz)] 116 kg (255 lb 11.7 oz) (09/09 2240)  HEMODYNAMICS:   VENTILATOR SETTINGS:   INTAKE / OUTPUT: Intake/Output     09/09 0701 - 09/10 0700 09/10 0701 - 09/11 0700   I.V. (mL/kg) 4890.5 (42.2)    IV Piggyback 2100    Total Intake(mL/kg) 6990.5 (60.3)    Urine (mL/kg/hr) 875    Total Output 875     Net +6115.5           PHYSICAL EXAMINATION: General:  Appears acutely ill  Neuro:  Oriented X 3 HEENT:  PERRL, dry membranes Cardiovascular:  Tachycardic, regular Lungs:  Decreased bases Abdomen:  Soft, mild generalized tenderness, bowel sounds diminished Musculoskeletal:  Moves all extremities, surgical dressing on feet bilaterally  Skin:  Intact  LABS: CBC Recent Labs     06/25/13  1135  06/26/13  0525  WBC  17.1*  12.0*  HGB  12.5*   10.8*  HCT  36.9*  32.9*  PLT  243  207    Coag's No results found for this basename: APTT, INR,  in the last 72 hours  BMET Recent Labs     06/26/13  0100  06/26/13  0525  06/26/13  0910  NA  134*  133*  131*  K  3.7  3.4*  3.5  CL  104  101  98  CO2  22  21  22   BUN  25*  23  23  CREATININE  1.42*  1.26  1.42*  GLUCOSE  167*  213*  203*    Electrolytes Recent Labs     06/26/13  0100  06/26/13  0525  06/26/13  0910  CALCIUM  8.5  8.6  8.8  MG  1.7   --    --     Sepsis Markers Recent Labs     06/25/13  2310  PROCALCITON  6.23    ABG No results found for this basename: PHART, PCO2ART, PO2ART,  in the last 72 hours  Liver Enzymes No results found for this basename: AST, ALT, ALKPHOS, BILITOT, ALBUMIN,  in the last 72 hours  Cardiac Enzymes Recent Labs     06/26/13  0100  06/26/13  0525  TROPONINI  12.42*  10.97*    Glucose Recent  Labs     06/25/13  1928  06/25/13  2149  06/25/13  2334  06/26/13  0038  06/26/13  0136  06/26/13  0430  GLUCAP  235*  204*  171*  166*  161*  170*    Imaging Dg Foot Complete Right  06/25/2013   *RADIOLOGY REPORT*  Clinical Data: Foot abscess; diabetes mellitus  RIGHT FOOT COMPLETE - 3+ VIEW  Comparison: None.  Findings: Frontal, oblique, lateral views were obtained.  There are Charcot changes throughout the midfoot.  There is fragmentation in the tarsal - metatarsal region with a divergent Lisfranc type fracture in the mid foot between the first and second metatarsals.  There is extensive soft tissue air throughout the volar aspect of the foot consistent with widespread abscess.  There is no well- defined osteomyelitis on this study.  There is narrowing of all PIP and DIP joints.  There is pes planus.  IMPRESSION: Extensive soft tissue air consistent with abscess tracking along the volar aspect of the foot somewhat diffusely but concentrated primarily at the level of the metatarsals.  No overt osteomyelitis.  Charcot  changes with fragmentation of the mid foot region. Lisfranc type separation between the first and second metatarsals.  Pes planus.  Multilevel osteoarthritic change in the PIP and DIP joints.   Original Report Authenticated By: Bretta Bang, M.D.    ASSESSMENT / PLAN:  PULMONARY A:  No active issues. P:   Gaol SpO2>92 Supplemental oxygen PRN Was pos 6 liters, obtain pcxr for volume status  CARDIOVASCULAR A:  Sepsis secondary to soft tissue infection.   Sinus tachycardia, normal physiologic response to fever / pain / hypovolemia but in setting ischemia Hypotension in setting of sepsis and severe dehydration (improved) Abnormal troponin. Concerned for demand ischemia vs NSTEMI P:  Goal MAP>60, may require neo Low threshold bolus further likely to need cvp Echo ordered  ASA / Zocor Heparin gtt Start b blocker, in setting of prob NSTEMI and tachy Cards eval   RENAL A:   AKI. Severe dehydration in setting of glucosuria / poor intake / losses with fever. Not currently acidotic AG closed.  P:   Trend BMP Cont IVFs Avoid nephro-toxins  GASTROINTESTINAL A:   No active issues. P:   Clears Protonix for GI Px  HEMATOLOGIC A:   Mild anemia. P:  Trend CBC Cont IV heparin for now  INFECTIOUS A:   R foot abscess in setting of DM, post debridement.   P:   Cultures and antibiotics as above Clindamycin added to prevent toxin formation if necrotizing fasciitis, would consider to dc if hemodynamics improve  ENDOCRINE  A:   DM.  Hyperglycemia.  DKA (resolved) P:   DKA protocol to off as gap closed  NEUROLOGIC A:   Diabetic neuropathy.  Post op pain. Prior CVA w/ RUE def P:   Fentanyl / Oxycodone PRN Lyrica  I have personally obtained a history, examined the patient, evaluated laboratory and imaging results, formulated the assessment and plan and placed orders.  CRITICAL CARE:  The patient is critically ill with multiple organ systems failure and requires high  complexity decision making for assessment and support, frequent evaluation and titration of therapies, application of advanced monitoring technologies and extensive interpretation of multiple databases. Critical Care Time devoted to patient care services described in this note is 30 minutes.   Josepha Pigg. Tyson Alias, MD, FACP Pgr: [phone] [facility]  06/26/2013, 10:08 AM

## 2013-06-26 NOTE — Progress Notes (Signed)
Oneita Hurt, RN cosign at 2240 on 06/25/13 in error. Guinevere Scarlet, RN completed assessment

## 2013-06-26 NOTE — Progress Notes (Signed)
CRITICAL VALUE ALERT  Critical value received:  Troponin 12.42  Date of notification:  06/26/13  Time of notification:  01:54  Critical value read back:yes  Nurse who received alert:  Guinevere Scarlet, RN  MD notified (1st page):  Dr. Audelia Hives  Time of first page:  01:54  MD notified (2nd page):  Time of second page:  Responding MD:  Dr. Audelia Hives  Time MD responded:  01:54

## 2013-06-26 NOTE — Consult Note (Signed)
CARDIOLOGY CONSULT NOTE    Patient ID: Albert [name] MRN: [id number] DOB/AGE: 1961/06/09 52 y.o.  Admit date: 06/25/2013 Referring Physician:  CCM Primary Physician: [name], MD Primary Cardiologist:  [name] Reason for Consultation: Elevated Troponin  Principal Problem:   Foot abscess, right Active Problems:   Hypercholesteremia   Diabetes mellitus out of control   Essential hypertension, benign   Atherosclerotic peripheral vascular disease   AKI (acute kidney injury)   Sepsis   Dehydration with hyponatremia   Leukocytosis, unspecified   Sepsis(995.91)   HPI:  52 yo poorly controlled diabetic with PVD.  Had left pop to posterior tibial bypass in February  Preop consult Dr [name] cleared with no stress testing.  EF 50-55% by echo.  No history of CAD Subsequently has had issues with charcot joint on right foot.  Debridement this hospitalization under general anesthesia.  Post op with some hypotension and tachycardia.  On xarelto pre op ? For graft patency off label use.  Troponin 10.  Patient not having any chest pain or overt CHF.  No acute ECG changes.  Currently he just complains of right foot pain.  Being Rx for sepsis and DKA   All other systems reviewed and negative except as noted above  Past Medical History  Diagnosis Date  . Hypertension   . Diabetes mellitus without complication ~2000    last HbA1c ~9  . Hypercholesteremia   . CVA (cerebral infarction) 2011    R hand deficit  . Chronic sinusitis   . Allergic rhinitis   . Chronic cough   . Complication of anesthesia     couldn't swallow and talk    Family History  Problem Relation Age of Onset  . Diabetes    . Pancreatic cancer Mother   . Diabetes Mother   . Hyperlipidemia Mother   . Breast cancer Sister   . Depression Maternal Grandmother   . Heart disease Maternal Grandmother   . Depression Maternal Grandfather   . Heart disease Maternal Grandfather   . Depression Paternal Grandmother     . Heart disease Paternal Grandmother   . Depression Paternal Grandfather   . Heart disease Paternal Grandfather     History   Social History  . Marital Status: Married    Spouse Name: [name]    Number of Children: [name]  . Years of Education: [name]   Occupational History  . Not on file.   Social History Main Topics  . Smoking status: Former Games developer  . Smokeless tobacco: Never Used  . Alcohol Use: No  . Drug Use: No  . Sexual Activity: No   Other Topics Concern  . Not on file   Social History Narrative   Lives in [place]   Wife: [name]    Past Surgical History  Procedure Laterality Date  . 4th toe amputation Left Jan. 2014    4th toe in [place]  . Tonsillectomy and adenoidectomy    . Penile prosthesis implant    . I&d extremity Left 12/08/2012    Procedure: IRRIGATION AND DEBRIDEMENT EXTREMITY;  Surgeon: Kathryne Hitch, MD;  [facility];  Service: Orthopedics;  Laterality: Left;  . Amputation Left 12/08/2012    Procedure: AMPUTATION DIGIT;  Surgeon: Kathryne Hitch, MD;  Location: [facility] OR;  Service: Orthopedics;  Laterality: Left;  3rd toe amputation possible 5th toe amputation  . I&d extremity Left 12/11/2012    Procedure: REPEAT I&D LEFT FOOT;  Surgeon: Kathryne Hitch, MD;  Location: [facility]  OR;  Service: Orthopedics;  Laterality: Left;  . Amputation Left 12/11/2012    Procedure: Amputation of 2nd Toe;  Surgeon: Kathryne Hitch, MD;  Location: [facility] OR;  Service: Orthopedics;  Laterality: Left;  . Bypass graft popliteal to tibial Left 12/13/2012    Procedure: BYPASS GRAFT POPLITEAL TO TIBIAL;  Surgeon: Nada Libman, MD;  Location: [facility] OR;  Service: Vascular;  Laterality: Left;  Left Popliteal to Posterior Tibial Bypass Graft with reversed saphenous vein graft     . aspirin  81 mg Oral Daily  . clindamycin (CLEOCIN) IV  900 mg Intravenous Q6H  . insulin regular  0-10 Units Intravenous TID WC  . lidocaine      . metoprolol  5 mg Intravenous Q3H  .  pantoprazole (PROTONIX) IV  40 mg Intravenous QHS  . piperacillin-tazobactam (ZOSYN)  IV  3.375 g Intravenous Q8H  . pregabalin  75 mg Oral BID  . simvastatin  40 mg Oral QPM  . vancomycin  1,500 mg Intravenous Q24H   . sodium chloride 125 mL/hr at 06/26/13 1138  . dextrose 5 % and 0.45% NaCl 1,000 mL (06/26/13 0006)  . heparin 1,500 Units/hr (06/26/13 1139)  . insulin (NOVOLIN-R) infusion 14.3 Units/hr (06/26/13 1100)    Physical Exam: Blood pressure 111/75, pulse 132, temperature 100.6 F (38.1 C), temperature source Axillary, resp. rate 28, height 5\' 11"  (1.803 m), weight 255 lb 11.7 oz (116 kg), SpO2 93.00%.   Affect appropriate Chronically ill white male HEENT: normal Neck supple with no adenopathy JVP normal no bruits no thyromegaly Lungs clear with no wheezing and good diaphragmatic motion Heart:  S1/S2 no murmur, no rub, gallop or click PMI normal Abdomen: benighn, BS positve, no tenderness, no AAA no bruit.  No HSM or HJR S/P left pop tib bypass.  RLE swollen with dressing in place  Bilateral Charcot joints No edema Neuro non-focal Skin warm and dry No muscular weakness   Labs:   Lab Results  Component Value Date   WBC 12.0* 06/26/2013   HGB 10.8* 06/26/2013   HCT 32.9* 06/26/2013   MCV 80.4 06/26/2013   PLT 207 06/26/2013    Recent Labs Lab 06/26/13 0910  NA 131*  K 3.5  CL 98  CO2 22  BUN 23  CREATININE 1.42*  CALCIUM 8.8  GLUCOSE 203*   Lab Results  Component Value Date   TROPONINI 10.97* 06/26/2013    Lab Results  Component Value Date   CHOL 91 12/08/2012   Lab Results  Component Value Date   HDL 22* 12/08/2012   Lab Results  Component Value Date   LDLCALC 54 12/08/2012   Lab Results  Component Value Date   TRIG 73 12/08/2012   Lab Results  Component Value Date   CHOLHDL 4.1 12/08/2012   No results found for this basename: LDLDIRECT      Radiology: Dg Foot Complete Right  06/25/2013   *RADIOLOGY REPORT*  Clinical Data: Foot abscess;  diabetes mellitus  RIGHT FOOT COMPLETE - 3+ VIEW  Comparison: None.  Findings: Frontal, oblique, lateral views were obtained.  There are Charcot changes throughout the midfoot.  There is fragmentation in the tarsal - metatarsal region with a divergent Lisfranc type fracture in the mid foot between the first and second metatarsals.  There is extensive soft tissue air throughout the volar aspect of the foot consistent with widespread abscess.  There is no well- defined osteomyelitis on this study.  There is narrowing of all PIP and  DIP joints.  There is pes planus.  IMPRESSION: Extensive soft tissue air consistent with abscess tracking along the volar aspect of the foot somewhat diffusely but concentrated primarily at the level of the metatarsals.  No overt osteomyelitis.  Charcot changes with fragmentation of the mid foot region. Lisfranc type separation between the first and second metatarsals.  Pes planus.  Multilevel osteoarthritic change in the PIP and DIP joints.   Original Report Authenticated By: Bretta Bang, M.D.    EKG: SR no acute ST elevation poor R wave progression low voltage   ASSESSMENT AND PLAN:  SEMI:  Not clear that hypotension and tachycardia related to heart.  DKA and sepsis contributing.  Have baseline echo from 2/14 to compare.  Agree with heparin.  Writen  For iv beta blocker. Can put on PO if echo shows no worsening of EF.  Given his risk factors and likely need for more surgery including vascular or amputation I would consider cath prior to discharge.  No indication for acute cath ID:  Continue broad spectrum antibiotics.  Pain control  Wound care Anticoagulation.  ? On xarelto pre admit for vascular graft patency.  Will need to reconsider if cardiac intervention needed as DES would be preferred in diabetic Chol:  Continue statin  Signed: Charlton Haws 06/26/2013, 11:52 AM

## 2013-06-26 NOTE — Progress Notes (Signed)
Inpatient Diabetes Program Recommendations  AACE/ADA: New Consensus Statement on Inpatient Glycemic Control (2013)  Target Ranges:  Prepandial:   less than 140 mg/dL      Peak postprandial:   less than 180 mg/dL (1-2 hours)      Critically ill patients:  140 - 180 mg/dL  Results for Albert [name], Albert [name] (MRN [id number]) as of 06/26/2013 09:21  Ref. Range 06/25/2013 21:49 06/25/2013 23:34 06/26/2013 00:38 06/26/2013 01:36 06/26/2013 04:30  Glucose-Capillary Latest Range: 70-99 mg/dL 782 (H) 956 (H) 213 (H) 161 (H) 170 (H)   Inpatient Diabetes Program Recommendations Insulin - Basal: Levemir 20 units 1-2 hours before dc of insulin gtt (home dose) Correction (SSI): Novolog resistant scale TID + HS scale Insulin - Meal Coverage: add Novolog with meals per Glycemic Control order set Currently on insulin gtt with CBGs within range but gtt rate >2 units per hour. Recommend transition to home dose Levemir when deemed appropriate. Will follow. Thank you  Piedad Climes BSN, RN,CDE Inpatient Diabetes Coordinator [phone] (team pager)

## 2013-06-26 NOTE — Progress Notes (Signed)
*  PRELIMINARY RESULTS* Echocardiogram 2D Echocardiogram has been performed.  Albert [name] 06/26/2013, 1:43 PM

## 2013-06-26 NOTE — Progress Notes (Addendum)
52/M with uncontrolled DM admitted with R foot abscess, s/p i&D at 2am , subsequently with sepsis, hypotension, received 6L IVF bolus, also hyperglycemia without DKA on Insulin gtt and NSTEMI, troponin of 12 in setting of sepsis D/w PCCM, they will manage now and transfer back to [facility] when stable  Zannie Cove, MD [phone]

## 2013-06-26 NOTE — Op Note (Signed)
NAMEMALEKAI, Albert [name] NO.:  [id number]  MEDICAL RECORD NO.:  [id number]  LOCATION:  1235                         FACILITY:  [facility]  PHYSICIAN:  Vanita Panda. Magnus Ivan, M.D.DATE OF BIRTH:  Oct 08, 1961  DATE OF PROCEDURE:  06/25/2013 DATE OF DISCHARGE:                              OPERATIVE REPORT   PREOPERATIVE DIAGNOSIS:  Right foot abscess with severe Charcot foot changes in a poorly controlled diabetic.  POSTOPERATIVE DIAGNOSIS:  Right foot abscess with severe Charcot foot changes in a poorly controlled diabetic.  PROCEDURE:  Irrigation and debridement of right foot plantar abscess with removal of skin, soft tissue, and muscle including exploration of deep tissues.  FINDINGS:  Large area of gross purulence and necrotic tissue at the plantar aspect of the midfoot on the right foot.  SURGEON:  Vanita Panda. Magnus Ivan, M.D.  ANESTHESIA:  General.  BLOOD LOSS:  Less than 50 mL.  COMPLICATIONS:  None.  INDICATIONS:  Albert [name] is a 52 year old gentleman with poorly- controlled diabetes.  We have been following him for Charcot collapse of his right foot for some time.  [facility].  I saw him in the office yesterday, but did not take his shoes off because he was just coming by the office for me to have him return to work.  [facility] had been taking care of his feet and he said that this feet were actually fine.  He was surprisingly in an upbeat mood, appeared great in terms of just his demeanor, and did not appear sick.  There was no odor either.  In hindsight, he probably should have taken his shoe off, but he said [facility] was taking care of it, he just need to release him to work.  He then over the last 24 hours developed fevers, chills, and worsening drainage from his right foot. Over the last 24 hours, he has gotten quite sick and proceeded to the emergency room today with blood glucose greater than 400  and obvious abscess of his right foot.  He is __________ appear septic.  I recommended he undergo an urgent irrigation and debridement to open up the foot in its entirety.  He has already had x-rays that showed severe midfoot collapse and Charcot changes.  The biggest concern is that there is a chance that he may have to lose this extremity in order to keep him from getting more sick.  I have explained him for this setting, we would just open the foot up in its entirety and remove as much necrotic tissues as we can and get rid of the gross purulence while he is on the antibiotics.  PROCEDURE DESCRIPTION:  After informed consent was obtained, appropriate right foot was marked.  He was brought to the operating room, placed supine on the operating table.  General anesthesia was then obtained.  A Foley catheter was placed and then his right foot was prepped and draped from the mid shin down to the toes with Betadine scrub and paint.  Time- out was called and he was identified as correct patient, correct right foot.  There was a large plantar wound on the  bottom of his foot and I used a sharp #10 blade to excise this and then I dissected all the way down to the midfoot bones.  There was gross purulence all the way in the mid foot area and I made a second incision medially as well to connect these.  I removed necrotic muscle, soft tissue, and fascia over a wide area.  Once I accomplished this, I then thoroughly irrigated the foot with 500 mL of bacitracin solution followed by 3 L of normal saline solution.  I then packed the wound with wet-to-dry dressings and then sterile dressings followed by dry dressing on the most superficial layer.  He was awakened, extubated, and taken to the recovery room in guarded position.  Again, the concern is that potential for sepsis and the need for close monitoring.  I will talk to him in great detail in the next several days to explain the likelihood that he  will need a below-knee amputation of this foot given the severe Charcot changes and collapse.     Vanita Panda. Magnus Ivan, M.D.     CYB/MEDQ  D:  06/25/2013  T:  06/26/2013  Job:  [id number]

## 2013-06-26 NOTE — Progress Notes (Signed)
Patient ID: Albert [name], male   DOB: [birth year], 52 y.o.   MRN: [id number] [name] is awake this am and appears less sick.  He states that he feels better.  His WBC is done to 12,000 and he is afebrile.  In surgery last night, I found extensive infection through out the plantar aspect of his foot and collapse of the midfoot bones due to Charcot neuropathy.  He now has extensive wounds to the point that he would likely benefit from a right below knee amputation.  I have started discussions with him about this and will have Dr. Lajoyce Corners come by for a second opinion.  In the interim, I will have the PT wound service start daily hydrotherapy on his right foot.  IV antibiotic should also be continued another 24-48 hours due to him being on the verge of sepsis last evening.

## 2013-06-26 NOTE — Progress Notes (Signed)
PHARMACY BRIEF NOTE -- Anticoagulation  Consult for:  IV Heparin Indication:  Elevated Troponin/ACS  With infusion of 1500 units/hr, the heparin level drawn at 18:25 was reported as < 0.1 units/ml.  This result is below the therapeutic range, 0.3-0.7 units/ml.  The patient's nurse is not aware of any problems with the heparin infusion; however, the nursing shift has changed recently.  Plan:  Bolus with 2500 units.  Increase the rate to 2100 units/hr.  Repeat the heparin level in 6 hours, at 02:00 9/11.  Polo Riley R.Ph. 06/26/2013 8:06 PM

## 2013-06-27 ENCOUNTER — Inpatient Hospital Stay (HOSPITAL_COMMUNITY): Payer: 59

## 2013-06-27 DIAGNOSIS — I214 Non-ST elevation (NSTEMI) myocardial infarction: Secondary | ICD-10-CM

## 2013-06-27 LAB — BASIC METABOLIC PANEL
BUN: 23 mg/dL (ref 6–23)
BUN: 23 mg/dL (ref 6–23)
Calcium: 8.1 mg/dL — ABNORMAL LOW (ref 8.4–10.5)
Chloride: 94 mEq/L — ABNORMAL LOW (ref 96–112)
GFR calc Af Amer: 90 mL/min — ABNORMAL LOW (ref >90)
GFR calc non Af Amer: 84 mL/min — ABNORMAL LOW (ref >90)
Glucose, Bld: 172 mg/dL — ABNORMAL HIGH (ref 70–99)
Potassium: 3.6 mEq/L (ref 3.5–5.1)

## 2013-06-27 LAB — COMPREHENSIVE METABOLIC PANEL
ALT: 33 U/L (ref 0–53)
AST: 64 U/L — ABNORMAL HIGH (ref 0–37)
CO2: 22 mEq/L (ref 19–32)
Calcium: 8.3 mg/dL — ABNORMAL LOW (ref 8.4–10.5)
Creatinine, Ser: 1.14 mg/dL (ref 0.50–1.35)
GFR calc Af Amer: 85 mL/min — ABNORMAL LOW (ref >90)
GFR calc non Af Amer: 73 mL/min — ABNORMAL LOW (ref >90)
Sodium: 130 mEq/L — ABNORMAL LOW (ref 135–145)
Total Protein: 5.7 g/dL — ABNORMAL LOW (ref 6.0–8.3)

## 2013-06-27 LAB — CBC
MCH: 26.1 pg (ref 26.0–34.0)
MCHC: 32.9 g/dL (ref 30.0–36.0)
MCV: 79.4 fL (ref 78.0–100.0)
Platelets: 201 10*3/uL (ref 150–400)
RBC: 4.02 MIL/uL — ABNORMAL LOW (ref 4.22–5.81)
RDW: 12.8 % (ref 11.5–15.5)

## 2013-06-27 LAB — GLUCOSE, CAPILLARY
Glucose-Capillary: 170 mg/dL — ABNORMAL HIGH (ref 70–99)
Glucose-Capillary: 124 mg/dL — ABNORMAL HIGH (ref 70–99)

## 2013-06-27 LAB — HEPARIN LEVEL (UNFRACTIONATED)
Heparin Unfractionated: <0.1 IU/mL — ABNORMAL LOW (ref 0.30–0.70)
Heparin Unfractionated: 0.45 IU/mL (ref 0.30–0.70)

## 2013-06-27 MED ORDER — METOPROLOL TARTRATE 50 MG PO TABS
50.0000 mg | ORAL_TABLET | Freq: Two times a day (BID) | ORAL | Status: DC
  Administered 2013-06-27 – 2013-07-03 (×11): 50 mg via ORAL
  Filled 2013-06-27 (×15): qty 1

## 2013-06-27 MED ORDER — HEPARIN (PORCINE) IN NACL 100-0.45 UNIT/ML-% IJ SOLN
3000.0000 [IU]/h | INTRAMUSCULAR | Status: DC
  Administered 2013-06-27 – 2013-06-28 (×3): 3000 [IU]/h via INTRAVENOUS
  Filled 2013-06-27 (×6): qty 250

## 2013-06-27 MED ORDER — HEPARIN BOLUS VIA INFUSION
2500.0000 [IU] | Freq: Once | INTRAVENOUS | Status: AC
  Administered 2013-06-27: 2500 [IU] via INTRAVENOUS
  Filled 2013-06-27: qty 2500

## 2013-06-27 MED ORDER — PANTOPRAZOLE SODIUM 40 MG PO TBEC
40.0000 mg | DELAYED_RELEASE_TABLET | Freq: Every day | ORAL | Status: DC
  Administered 2013-06-27 – 2013-07-03 (×6): 40 mg via ORAL
  Filled 2013-06-27 (×6): qty 1

## 2013-06-27 MED ORDER — HEPARIN (PORCINE) IN NACL 100-0.45 UNIT/ML-% IJ SOLN
2600.0000 [IU]/h | INTRAMUSCULAR | Status: DC
  Administered 2013-06-27 (×2): 2600 [IU]/h via INTRAVENOUS
  Filled 2013-06-27 (×2): qty 250

## 2013-06-27 MED ORDER — POTASSIUM CHLORIDE CRYS ER 20 MEQ PO TBCR
40.0000 meq | EXTENDED_RELEASE_TABLET | Freq: Once | ORAL | Status: AC
  Administered 2013-06-27: 40 meq via ORAL
  Filled 2013-06-27: qty 2

## 2013-06-27 MED ORDER — POTASSIUM CHLORIDE CRYS ER 20 MEQ PO TBCR
40.0000 meq | EXTENDED_RELEASE_TABLET | Freq: Once | ORAL | Status: AC
  Administered 2013-06-27: 40 meq via ORAL

## 2013-06-27 MED ORDER — VANCOMYCIN HCL 10 G IV SOLR
1500.0000 mg | Freq: Two times a day (BID) | INTRAVENOUS | Status: DC
  Administered 2013-06-27 – 2013-06-29 (×5): 1500 mg via INTRAVENOUS
  Filled 2013-06-27 (×7): qty 1500

## 2013-06-27 NOTE — Progress Notes (Signed)
Call received back from [facility] for Dr. Magnus Ivan. As long as pt is tolerating hydrotherapy with foot Dr Magnus Ivan will hold off on OR. Cardiology may proceed with cath if needed per phone conversation with Bronson Curb.

## 2013-06-27 NOTE — Progress Notes (Signed)
SUBJECTIVE: No chest pain or SOB  BP 123/82  Pulse 132  Temp(Src) 98.6 F (37 C) (Oral)  Resp 21  Ht 5\' 11"  (1.803 m)  Wt 255 lb 11.7 oz (116 kg)  BMI 35.68 kg/m2  SpO2 95%  Intake/Output Summary (Last 24 hours) at 06/27/13 0709 Last data filed at 06/27/13 0600  Gross per 24 hour  Intake 3199.7 ml  Output   1725 ml  Net 1474.7 ml    PHYSICAL EXAM General: Well developed, well nourished, in no acute distress. Alert and oriented x 3.  Psych:  Good affect, responds appropriately Neck: No JVD. No masses noted.  Lungs: Clear bilaterally with no wheezes or rhonci noted.  Heart: RRR with no murmurs noted. Abdomen: Bowel sounds are present. Soft, non-tender.  Extremities: Bandages over both feet.   LABS: Basic Metabolic Panel:  Recent Labs  16/10/96 1811 06/26/13 0100  06/26/13 2333 06/27/13 0200  NA 131* 134*  < > 125* 130*  K 3.8 3.7  < > 3.1* 3.2*  CL 97 104  < > 94* 98  CO2 21 22  < > 21 22  GLUCOSE 307* 167*  < > 120* 109*  BUN 32* 25*  < > 22 22  CREATININE 1.64* 1.42*  < > 1.15 1.14  CALCIUM 9.9 8.5  < > 8.2* 8.3*  MG  --  1.7  --   --   --   < > = values in this interval not displayed. CBC:  Recent Labs  06/25/13 1135 06/26/13 0525 06/27/13 0200  WBC 17.1* 12.0* 10.3  NEUTROABS 15.5*  --   --   HGB 12.5* 10.8* 10.5*  HCT 36.9* 32.9* 31.9*  MCV 80.0 80.4 79.4  PLT 243 207 201   Cardiac Enzymes:  Recent Labs  06/26/13 0100 06/26/13 0525 06/26/13 1135  TROPONINI 12.42* 10.97* 15.24*   Current Meds: . aspirin  81 mg Oral Daily  . clindamycin (CLEOCIN) IV  900 mg Intravenous Q6H  . insulin aspart  0-15 Units Subcutaneous TID WC  . insulin aspart  0-5 Units Subcutaneous QHS  . insulin aspart  6 Units Subcutaneous TID WC  . insulin detemir  20 Units Subcutaneous Daily  . metoprolol  5 mg Intravenous Q3H  . pantoprazole (PROTONIX) IV  40 mg Intravenous QHS  . piperacillin-tazobactam (ZOSYN)  IV  3.375 g Intravenous Q8H  . pregabalin  75  mg Oral BID  . simvastatin  40 mg Oral QPM  . vancomycin  1,500 mg Intravenous Q24H   Echo 06/26/13: Left ventricle: Poor endocardial definition Distal septal hypokinesis EF low normal or mildly decreased The cavity size was normal. Wall thickness was normal. - Atrial septum: No defect or patent foramen ovale was identified. - Pulmonary arteries: PA peak pressure: 42mm Hg (S). - Impressions: No obvious vegetations Impressions: - No obvious vegetations  ASSESSMENT AND PLAN: 52 yo male with history of HTN, HLD, prior CVA, DM, severe PAD admitted with right foot abscess, developed sepsis with hypotension and tachycardia. He has since ruled in for an MI with troponin of 15.   1. NSTEMI: Pt with known severe PAD and uncontrolled DM, HTN who likely has obstructive CAD. At this time he has no ischemic EKG changes or c/o chest pain. Telemetry shows sinus tach. Echo with apical hypokinesis with overall low normal LVEF. His elevated troponin is likely related to his underlying sepsis with hypotension in setting of probably underlying of high grade CAD. Recommend cardiac cath when  sepsis resolved. We will follow along with you and make recommendations. If a right BKA is planned under general anesthesia, I would favor a cardiac cath before he is put under general anesthesia. He would likely be found to have high grade CAD and would need PCI with stent placement or require CABG. If stent placed, would need anti-platelet therapy to be continued during the BKA. Complex situation. I will be glad to arrange a cardiac cath after discussion with surgery team and critical care medicine team. My pager is [phone].  -Would continue heparin drip, ASA, beta blocker and statin for now.   2. Right foot abscess: Surgery following. S/p debridement 06/26/13. Possible BKA in next several days.    MCALHANY,CHRISTOPHER  9/11/20147:09 AM

## 2013-06-27 NOTE — Progress Notes (Signed)
Patient ID: Albert [name], male   DOB: Jun 20, 1961, 52 y.o.   MRN: [id number] After talking with nursing and reviewing Cardiology's notes,it sounds like we should hold off on a below-knee amputation until after a cardiac cath is performed due to the risks of surgery under general anesthesia. He did get hydrotherapy on his foot today and I still believe that his only option is a BKA.  I would like to perform this surgery when he is cleared by Cards.  Hydrotherapy to continue in the interim.

## 2013-06-27 NOTE — Progress Notes (Signed)
PHARMACY BRIEF NOTE -- Anticoagulation (follow-up heparin level)  Consult for:  IV Heparin Indication:  Elevated Troponin/ACS  With infusion of 3000 units/hr, the heparin level = 0.45 units/ml.  This result is within therapeutic range, 0.3-0.7 units/ml. CBC is stable.  Plan:  Continue heparin at  3000 units/hr.  Daily heparin level and CBC  Juliette Alcide, PharmD, BCPS.   Pager: [phone] 06/27/2013 6:06 PM

## 2013-06-27 NOTE — Progress Notes (Signed)
eLink Physician-Brief Progress Note Patient Name: Albert [name] DOB: September 10, 1961 MRN: [id number]  Date of Service  06/27/2013   HPI/Events of Note  Hypokalemia   eICU Interventions  Potassium replaced   Intervention Category Minor Interventions: Electrolytes abnormality - evaluation and management  DETERDING,ELIZABETH 06/27/2013, 12:43 AM

## 2013-06-27 NOTE — Progress Notes (Signed)
PULMONARY  / CRITICAL CARE MEDICINE  Name: Albert [name] MRN: [id number] DOB: June 09, 1961    ADMISSION DATE:  06/25/2013 PROGRESS VISIT DATE: 06/27/2013  CHIEF COMPLAINT: Sepsis  SIGNIFICANT EVENTS / STUDIES:  9/9 R foot XR >>> Extensive soft tissue air consistent with abscess tracking along the volar aspect of the foot somewhat diffusely but concentrated primarily at the level of the metatarsals.  9/9 OR >>> I&D of R foot abscess  9/10- trop pos, tachy 9/10 Echo >>> Left ventricle showed distal septal hypokinesis, EF low normal or mildly decreased   LINES / TUBES: Foley 9/9 >>>  CULTURES: 9/9 Blood >>> No growth 9/10 >>>  ANTIBIOTICS: Zosyn 9/9 >>>  Vancomycin 9/9 >>>  Clindamycin 9/9 >>>  HISTORY OF PRESENT ILLNESS: 52 y/o obese male presented to [facility] with a right foot abscess on 06/25/2013. Hx HTN, poorly controlled DM, severe PAD s/p toe amputations, hypercholesterolemia, and chronic sinusitis.  S/p I&D of right foot abscess 06/25/13, became hypotensive and tachycardic. Was admitted to ICU 06/25/13. Troponin level 12.42 06/26/13. Started on IV Lopressor.  SUBJECTIVE:   VITAL SIGNS: Temp:  [98.6 F (37 C)-100.1 F (37.8 C)] 99.2 F (37.3 C) (09/11 0800) Resp:  [20-32] 21 (09/11 0500) BP: (89-123)/(60-85) 123/82 mmHg (09/11 0500) SpO2:  [88 %-98 %] 95 % (09/11 0500) Weight:  [116 kg (255 lb 11.7 oz)] 116 kg (255 lb 11.7 oz) (09/11 0700) HEMODYNAMICS:   VENTILATOR SETTINGS:   INTAKE / OUTPUT: Intake/Output     09/10 0701 - 09/11 0700 09/11 0701 - 09/12 0700   I.V. (mL/kg) 2809.4 (24.2) 26 (0.2)   IV Piggyback 725 12.5   Total Intake(mL/kg) 3534.4 (30.5) 38.5 (0.3)   Urine (mL/kg/hr) 1725 (0.6)    Total Output 1725     Net +1809.4 +38.5          PHYSICAL EXAMINATION: General:  Appears chronically ill Neuro:  Oriented x 3 HEENT:  PERRL Cardiovascular:  SR, tachycardic Lungs:  Bilateral CTA Abdomen:  Soft, NT, +BS Musculoskeletal:  Moves all extremities, surgical  dressing on bilateral feet Skin:  intact  LABS:  CBC Recent Labs     06/25/13  1135  06/26/13  0525  06/27/13  0200  WBC  17.1*  12.0*  10.3  HGB  12.5*  10.8*  10.5*  HCT  36.9*  32.9*  31.9*  PLT  243  207  201   Coag's No results found for this basename: APTT, INR,  in the last 72 hours BMET Recent Labs     06/26/13  2333  06/27/13  0200  06/27/13  0845  NA  125*  130*  126*  K  3.1*  3.2*  3.6  CL  94*  98  94*  CO2  21  22  22   BUN  22  22  23   CREATININE  1.15  1.14  1.09  GLUCOSE  120*  109*  146*   Electrolytes Recent Labs     06/26/13  0100   06/26/13  2333  06/27/13  0200  06/27/13  0845  CALCIUM  8.5   < >  8.2*  8.3*  8.4  MG  1.7   --    --    --    --    < > = values in this interval not displayed.   Sepsis Markers Recent Labs     06/25/13  2310  06/27/13  0200  PROCALCITON  6.23  3.70   ABG No  results found for this basename: PHART, PCO2ART, PO2ART,  in the last 72 hours Liver Enzymes Recent Labs     06/27/13  0200  AST  64*  ALT  33  ALKPHOS  64  BILITOT  0.5  ALBUMIN  2.0*   Cardiac Enzymes Recent Labs     06/26/13  0100  06/26/13  0525  06/26/13  1135  TROPONINI  12.42*  10.97*  15.24*   Glucose Recent Labs     06/26/13  1404  06/26/13  1508  06/26/13  1552  06/26/13  1707  06/26/13  2243  06/27/13  0743  GLUCAP  163*  119*  100*  117*  119*  162*    Imaging Dg Chest Port 1 View  06/27/2013   *RADIOLOGY REPORT*  Clinical Data: Shortness of breath, evaluate endotracheal tube position  PORTABLE CHEST - 1 VIEW  Comparison: Chest x-ray of 01/15/2013  Findings: No endotracheal tube is visible.  The lungs are not as well aerated and may be mild pulmonary vascular congestion present with some basilar volume loss.  The heart is within upper limits of normal.  No bony abnormality is seen.  IMPRESSION: Diminished aeration.  Question mild pulmonary vascular congestion.   Original Report Authenticated By: Dwyane Dee, M.D.    Dg Foot Complete Right  06/25/2013   *RADIOLOGY REPORT*  Clinical Data: Foot abscess; diabetes mellitus  RIGHT FOOT COMPLETE - 3+ VIEW  Comparison: None.  Findings: Frontal, oblique, lateral views were obtained.  There are Charcot changes throughout the midfoot.  There is fragmentation in the tarsal - metatarsal region with a divergent Lisfranc type fracture in the mid foot between the first and second metatarsals.  There is extensive soft tissue air throughout the volar aspect of the foot consistent with widespread abscess.  There is no well- defined osteomyelitis on this study.  There is narrowing of all PIP and DIP joints.  There is pes planus.  IMPRESSION: Extensive soft tissue air consistent with abscess tracking along the volar aspect of the foot somewhat diffusely but concentrated primarily at the level of the metatarsals.  No overt osteomyelitis.  Charcot changes with fragmentation of the mid foot region. Lisfranc type separation between the first and second metatarsals.  Pes planus.  Multilevel osteoarthritic change in the PIP and DIP joints.   Original Report Authenticated By: Bretta Bang, M.D.     CXR:  ASSESSMENT / PLAN:  PULMONARY  A: No active issues.  P:  Goal SpO2>92  Supplemental oxygen PRN   CARDIOVASCULAR  A:  Sepsis secondary to soft tissue infection.  Sinus tachycardia, normal physiologic response to fever / pain / hypovolemia but in setting ischemia  Hypotension in setting of sepsis and severe dehydration (improved)  Abnormal troponin. Concerned for demand ischemia vs NSTEMI  P:  Goal MAP>60, may require neo   likely to need cvp  ASA / Zocor  Heparin gtt  Increase Metoprolol dose and change IV to PO  Cardiology and Surgery to decide on cath vs back to OR for Right BKA.   RENAL  A:  AKI. Severe dehydration in setting of glucosuria / poor intake / losses with fever.  Not currently acidotic  AG closed.  P:  Decrease IVFs  Avoid nephro-toxins    GASTROINTESTINAL  A:  No active issues.  P:  Clears  Protonix for GI Px   HEMATOLOGIC  A:  Mild anemia.  P:  Trend CBC  Cont IV heparin for now   INFECTIOUS  A:  R foot abscess in setting of DM, post debridement.  P:  Cultures and antibiotics as above  Clindamycin added to prevent toxin formation if necrotizing fasciitis, would consider to dc if hemodynamics improve   ENDOCRINE  A:  DM. Hyperglycemia.  DKA (resolved)  P:  SSI Repeat BMP 06/28/13  NEUROLOGIC  A:  Diabetic neuropathy. Post op pain.  Prior CVA w/ RUE def  P:  Fentanyl / Oxycodone PRN  Lyrica    06/27/2013, 10:23 AM   Reviewed above, examined pt.  He will need repeat I&D and possible BKA.  May need cardiac cath prior to surgery.  Cardiology an ortho deciding on plan.  Continue Abx and SSI.  Monitor hemodynamics.  Keep on PCCM service for now.  If stable after surgery, then transfer to triad.    Coralyn Helling, MD [facility] Pulmonary/Critical Care 06/27/2013, 1:41 PM Pager:  [phone] After 3pm call: [phone]

## 2013-06-27 NOTE — Progress Notes (Signed)
Patient ID: Albert [name], male   DOB: November 30, 1960, 52 y.o.   MRN: [id number] Although his WBC has come back down to normal, the right foot wound still has a wide area of necrotic tissue and some gross purulence.  The PT wound service is to come by today for hydrotherapy.  I will put him on the OR schedule for tomorrow for a repeat I&D vs a possible BKA.  I have spoken to him in length about this and have shown him a picture of the bottom of his foot.

## 2013-06-27 NOTE — Progress Notes (Signed)
06/27/13 1100  Wound 06/27/13 Diabetic ulcer Foot Right;Distal foot ulcer, I&D  Date First Assessed: 06/27/13   Wound Type: Diabetic ulcer  Location: Foot  Location Orientation: Right;Distal  Wound Description (Comments): foot ulcer, I&D  Present on Admission: Yes  Site / Wound Assessment Black;Bleeding;Dusky  % Wound base Red or Granulating 0%  % Wound base Black 60%  % Wound base Other (Comment) 40%  Peri-wound Assessment Edema;Maceration;Pink;Purple  Wound Length (cm) 9.5 cm  Wound Width (cm) 11.9 cm  Wound Depth (cm) 1.8 cm  Closure None  Drainage Amount Copious  Drainage Description Purulent;Sanguineous;Odor  Non-staged Wound Description Full thickness  Treatment Cleansed;Hydrotherapy (Pulse lavage);Packing (Saline gauze)  Dressing Type Gauze (Comment);Moist to dry  Dressing Changed Changed  Hydrotherapy  Pulsed Lavage with Suction (psi) 8 psi  Pulsed Lavage with Suction - Normal Saline Used 1000 mL  Pulsed Lavage Tip Tip with splash shield  Pulsed lavage therapy - wound location right foot  Wound Therapy - Assess/Plan/Recommendations  Wound Therapy - Clinical Statement pt will benefit from trial of pulsed lavage with pending amputation per ortho; continued pulse lavage to assist with cleansing and infection control  Wound Therapy - Functional Problem List decr amb  Factors Delaying/Impairing Wound Healing Altered sensation;Diabetes Mellitus;Immobility;Multiple medical problems  Hydrotherapy Plan Debridement;Dressing change;Patient/family education;Pulsatile lavage with suction  Wound Therapy - Frequency 6X / week  Wound Therapy - Current Recommendations Other (comment) (surgery following)  Wound Plan continue with pulsed lavage for cleansing and deb 6x per week until further plans/orders/surgery per ortho  Wound Therapy Goals - Improve the function of patient's integumentary system by progressing the wound(s) through the phases of wound healing by:  Decrease Necrotic Tissue to  90  Decrease Necrotic Tissue - Progress Goal set today  Increase Granulation Tissue to 10  Increase Granulation Tissue - Progress Goal set today  Improve Drainage Characteristics Mod  Improve Drainage Characteristics - Progress Goal set today  Goals/treatment plan/discharge plan were made with and agreed upon by patient/family Yes  Time For Goal Achievement 2 weeks  Wound Therapy - Potential for Goals Poor

## 2013-06-27 NOTE — Progress Notes (Signed)
eLink Physician-Brief Progress Note Patient Name: Albert [name] DOB: 12-03-1960 MRN: [id number]  Date of Service  06/27/2013   HPI/Events of Note  Hypokalemia   eICU Interventions  Potassium replaced   Intervention Category Minor Interventions: Electrolytes abnormality - evaluation and management  Garvis Downum 06/27/2013, 2:54 AM

## 2013-06-27 NOTE — Progress Notes (Signed)
PHARMACY BRIEF NOTE -- Anticoagulation  Consult for:  IV Heparin Indication:  Elevated Troponin/ACS  With infusion of 2600 units/hr, the heparin level = 0.24 units/ml.  This result is below the therapeutic range, 0.3-0.7 units/ml. N problems with the heparin infusion/bleeding per RN. CBC is stable.  Plan:  Increase the rate to 3000 units/hr.  Repeat the heparin level in 6 hours  MD: PLEASE SPECIFY WHEN HEPARIN SHOULD BE HELD PRIOR TO SURGERY - thank you  Loralee Pacas, PharmD, BCPS Pager: [phone] 06/27/2013 9:48 AM

## 2013-06-27 NOTE — Progress Notes (Signed)
Dr Magnus Ivan called for confirmation on surgery tomorrow for clarification on what to do with the Heparin drip. Bronson Curb PA answered call, waiting for response.

## 2013-06-27 NOTE — Progress Notes (Signed)
PHARMACY BRIEF NOTE -- Anticoagulation  Consult for:  IV Heparin Indication:  Elevated Troponin/ACS  With infusion of 2100 units/hr, the heparin level drawn at 0200 was reported as < 0.1 units/ml.  This result is below the therapeutic range, 0.3-0.7 units/ml.  The patient's nurse is not aware of any problems with the heparin infusion/bleeding.  Plan:  Bolus with 2500 units.  Increase the rate to 2600 units/hr.  Repeat the heparin level in 6 hours, at 0900 9/11.  Lorenza Evangelist  06/27/2013 3:07 AM

## 2013-06-27 NOTE — Progress Notes (Signed)
ANTIBIOTIC CONSULT NOTE - FOLLOW UP  Pharmacy Consult for Vancomycin and Zosyn Indication: R foot abscess  Allergies  Allergen Reactions  . Amoxicillin     vomiting    Patient Measurements: Height: 5\' 11"  (180.3 cm) Weight: 255 lb 11.7 oz (116 kg) IBW/kg (Calculated) : 75.3  Vital Signs: Temp: 98.6 F (37 C) (09/11 0400) Temp src: Oral (09/11 0400) BP: 123/82 mmHg (09/11 0500) Intake/Output from previous day: 09/10 0701 - 09/11 0700 In: 3199.7 [I.V.:2549.7; IV Piggyback:650] Out: 1725 [Urine:1725] Intake/Output from this shift:    Labs:  Recent Labs  06/25/13 1135  06/26/13 0525  06/26/13 1937 06/26/13 2333 06/27/13 0200  WBC 17.1*  --  12.0*  --   --   --  10.3  HGB 12.5*  --  10.8*  --   --   --  10.5*  PLT 243  --  207  --   --   --  201  CREATININE 2.47*  < > 1.26  < > 1.15 1.15 1.14  < > = values in this interval not displayed. Estimated Creatinine Clearance: 100.4 ml/min (by C-G formula based on Cr of 1.14).  78 ml/min/1.34m2 (normalized)    Microbiology: Recent Results (from the past 720 hour(s))  MRSA PCR SCREENING     Status: None   Collection Time    06/26/13  8:08 AM      Result Value Range Status   MRSA by PCR NEGATIVE  NEGATIVE Final   Comment:            The GeneXpert MRSA Assay (FDA     approved for NASAL specimens     only), is one component of a     comprehensive MRSA colonization     surveillance program. It is not     intended to diagnose MRSA     infection nor to guide or     monitor treatment for     MRSA infections.    Anti-infectives   Start     Dose/Rate Route Frequency Ordered Stop   06/26/13 1400  vancomycin (VANCOCIN) 1,500 mg in sodium chloride 0.9 % 500 mL IVPB     1,500 mg 250 mL/hr over 120 Minutes Intravenous Every 24 hours 06/25/13 1601     06/26/13 0000  clindamycin (CLEOCIN) IVPB 900 mg     900 mg 100 mL/hr over 30 Minutes Intravenous 4 times per day 06/25/13 2327     06/25/13 2118  polymyxin B 500,000 Units,  bacitracin 50,000 Units in sodium chloride irrigation 0.9 % 500 mL irrigation  Status:  Discontinued       As needed 06/25/13 2120 06/25/13 2141   06/25/13 2000  piperacillin-tazobactam (ZOSYN) IVPB 3.375 g     3.375 g 12.5 mL/hr over 240 Minutes Intravenous Every 8 hours 06/25/13 1600     06/25/13 1600  vancomycin (VANCOCIN) 500 mg in sodium chloride 0.9 % 100 mL IVPB     500 mg 100 mL/hr over 60 Minutes Intravenous STAT 06/25/13 1558 06/25/13 1815   06/25/13 1130  piperacillin-tazobactam (ZOSYN) IVPB 3.375 g     3.375 g 100 mL/hr over 30 Minutes Intravenous STAT 06/25/13 1048 06/25/13 1301   06/25/13 1100  vancomycin (VANCOCIN) IVPB 1000 mg/200 mL premix     1,000 mg 200 mL/hr over 60 Minutes Intravenous STAT 06/25/13 1048 06/25/13 1402      Assessment: 52yo M sent from wound center for evaluation of R foot wound. S/p I&D on 9/9 and started on  broad spectrum antibiotics with vancomycin/zosyn/clindamycin. Plan is for return to OR for repeat I&D and possible BKA.  SCr is rapidly improving with adequate UOP  Tmax 99.3  WBC improved  Blood cultures still pending   Goal of Therapy:  Vancomycin trough level 15-20 mcg/ml  Plan:   Increase Vancomycin to 1500mg  IV q12h and check trough at steady state  Continue Zosyn 3.375gm IV q8h (4hr extended infusions)  Continue Clindamycin per MD Follow up renal function & cultures, plans for surgery  Loralee Pacas, PharmD, BCPS Pager: [phone] 06/27/2013,7:30 AM

## 2013-06-28 ENCOUNTER — Encounter (HOSPITAL_COMMUNITY)
Admission: EM | Disposition: A | Discharge status: Rehabilitation Facility | Payer: Self-pay | Source: Home / Self Care | Attending: Internal Medicine

## 2013-06-28 DIAGNOSIS — I2510 Atherosclerotic heart disease of native coronary artery without angina pectoris: Secondary | ICD-10-CM

## 2013-06-28 HISTORY — PX: LEFT HEART CATHETERIZATION WITH CORONARY ANGIOGRAM: SHX5451

## 2013-06-28 LAB — URINALYSIS, ROUTINE W REFLEX MICROSCOPIC
Glucose, UA: 100 mg/dL — AB
Leukocytes, UA: NEGATIVE
Protein, ur: 30 mg/dL — AB
pH: 6 (ref 5.0–8.0)

## 2013-06-28 LAB — GLUCOSE, CAPILLARY
Glucose-Capillary: 215 mg/dL — ABNORMAL HIGH (ref 70–99)
Glucose-Capillary: 188 mg/dL — ABNORMAL HIGH (ref 70–99)

## 2013-06-28 LAB — CBC
Hemoglobin: 9.5 g/dL — ABNORMAL LOW (ref 13.0–17.0)
MCH: 26.4 pg (ref 26.0–34.0)
MCHC: 33.5 g/dL (ref 30.0–36.0)
MCV: 78.9 fL (ref 78.0–100.0)
Platelets: 242 10*3/uL (ref 150–400)

## 2013-06-28 LAB — URINE MICROSCOPIC-ADD ON

## 2013-06-28 LAB — BASIC METABOLIC PANEL
BUN: 22 mg/dL (ref 6–23)
Creatinine, Ser: 0.94 mg/dL (ref 0.50–1.35)
GFR calc Af Amer: >90 mL/min (ref >90)
GFR calc non Af Amer: >90 mL/min (ref >90)

## 2013-06-28 SURGERY — AMPUTATION BELOW KNEE
Anesthesia: General | Laterality: Right

## 2013-06-28 SURGERY — LEFT HEART CATHETERIZATION WITH CORONARY ANGIOGRAM
Anesthesia: LOCAL

## 2013-06-28 MED ORDER — SODIUM CHLORIDE 0.9 % IV SOLN: 250.0000 mL | INTRAVENOUS | Status: DC | PRN

## 2013-06-28 MED ORDER — ASPIRIN 81 MG PO CHEW
324.0000 mg | CHEWABLE_TABLET | ORAL | Status: AC
  Administered 2013-06-28: 324 mg via ORAL
  Filled 2013-06-28: qty 4

## 2013-06-28 MED ORDER — FENTANYL CITRATE 0.05 MG/ML IJ SOLN
INTRAMUSCULAR | Status: AC
  Filled 2013-06-28: qty 2

## 2013-06-28 MED ORDER — HEPARIN (PORCINE) IN NACL 100-0.45 UNIT/ML-% IJ SOLN
3000.0000 [IU]/h | INTRAMUSCULAR | Status: DC
  Filled 2013-06-28 (×2): qty 250

## 2013-06-28 MED ORDER — LIDOCAINE HCL (PF) 1 % IJ SOLN
INTRAMUSCULAR | Status: AC
  Filled 2013-06-28: qty 30

## 2013-06-28 MED ORDER — ASPIRIN 81 MG PO CHEW
81.0000 mg | CHEWABLE_TABLET | Freq: Every day | ORAL | Status: DC
  Administered 2013-06-29 – 2013-07-02 (×4): 81 mg via ORAL
  Filled 2013-06-28 (×5): qty 1

## 2013-06-28 MED ORDER — HEPARIN (PORCINE) IN NACL 2-0.9 UNIT/ML-% IJ SOLN
INTRAMUSCULAR | Status: AC
  Filled 2013-06-28: qty 1000

## 2013-06-28 MED ORDER — SODIUM CHLORIDE 0.9 % IJ SOLN: 3.0000 mL | Freq: Two times a day (BID) | INTRAMUSCULAR | Status: DC

## 2013-06-28 MED ORDER — DIAZEPAM 5 MG PO TABS
5.0000 mg | ORAL_TABLET | ORAL | Status: AC
  Administered 2013-06-28: 5 mg via ORAL
  Filled 2013-06-28: qty 1

## 2013-06-28 MED ORDER — SODIUM CHLORIDE 0.9 % IJ SOLN: 3.0000 mL | INTRAMUSCULAR | Status: DC | PRN

## 2013-06-28 MED ORDER — SODIUM CHLORIDE 0.9 % IV SOLN
INTRAVENOUS | Status: AC
  Administered 2013-06-28: 16:00:00 via INTRAVENOUS

## 2013-06-28 MED ORDER — MIDAZOLAM HCL 2 MG/2ML IJ SOLN
INTRAMUSCULAR | Status: AC
  Filled 2013-06-28: qty 2

## 2013-06-28 MED ORDER — SODIUM CHLORIDE 0.9 % IV SOLN: INTRAVENOUS | Status: DC

## 2013-06-28 MED ORDER — ASPIRIN 81 MG PO CHEW
324.0000 mg | CHEWABLE_TABLET | ORAL | Status: DC
  Filled 2013-06-28: qty 4

## 2013-06-28 MED ORDER — HEPARIN (PORCINE) IN NACL 100-0.45 UNIT/ML-% IJ SOLN
3300.0000 [IU]/h | INTRAMUSCULAR | Status: DC
  Administered 2013-06-29: 3300 [IU]/h via INTRAVENOUS
  Administered 2013-06-29 (×2): 3000 [IU]/h via INTRAVENOUS
  Administered 2013-06-30 – 2013-07-01 (×4): 3300 [IU]/h via INTRAVENOUS
  Filled 2013-06-28 (×12): qty 250

## 2013-06-28 NOTE — Progress Notes (Signed)
PHARMACY BRIEF NOTE -- Anticoagulation  Consult for:  IV Heparin Indication:  NSTEMI  Heparin level = 0.32 units/ml on 3000 units/hr.   No problems with the heparin infusion or bleeding reported  Hgb 9.5 Hct 28.4 Pltc 242K  Assessment:  Heparin level therapeutic; requiring a high infusion rate.  Plans for cardiac cath this PM noted  Plan: 1. Continue Heparin at present rate (3000 units/hr) for now. 2. Discontinue Heparin on call to cath lab (as ordered by cardiology). 2. Anticipate new orders following cardiac cath.  Elie Goody, PharmD, BCPS Pager: [phone] 06/28/2013  11:13 AM

## 2013-06-28 NOTE — Progress Notes (Signed)
Patient ID: Albert [name], male   DOB: 03/01/1961, 52 y.o.   MRN: [id number] I spoke with Cardiology as well as the patient in length.  He has significant vessel disease in his heart on cardiac cath.  He also he a severely infected right foot with collapsed bone, a large soft-tissue deficit, and necrotic tissue.  We will need to proceed to the OR tomorrow for a right below knee amputation.  i have spoken to my partner Dr. Lajoyce Corners who has agreed to proceed to surgery.

## 2013-06-28 NOTE — Progress Notes (Signed)
CARE MANAGEMENT NOTE 06/28/2013  Patient:  Albert [name], Albert [name]   Account Number:  [id number]  Date Initiated:  06/28/2013  Documentation initiated by:  DAVIS,RHONDA  Subjective/Objective Assessment:   patient on day 3 s/p extensive debridement of wounds and decubitus to feet, to have cardiac cath this p,m [id number] in preparation for a bka     Action/Plan:   tbd most likely snf   Anticipated DC Date:  07/01/2013   Anticipated DC Plan:  SKILLED NURSING FACILITY  In-house referral  Clinical Social Worker      DC Planning Services  NA      [facility] Choice  NA   Choice offered to / List presented to:  NA   DME arranged  NA      DME agency  NA     HH arranged  NA      HH agency  NA   Status of service:  In process, will continue to follow Medicare Important Message given?  NA - LOS <3 / Initial given by admissions (If response is "NO", the following Medicare IM given date fields will be blank) Date Medicare IM given:   Date Additional Medicare IM given:    Discharge Disposition:    Per UR Regulation:  Reviewed for med. necessity/level of care/duration of stay  If discussed at Long Length of Stay Meetings, dates discussed:    Comments:  [id number] Stark Jock, BSN, [place] [phone] Chart Reviewed for discharge and hospital needs. Discharge needs at time of review:  None Review of patient progress due on [id number].

## 2013-06-28 NOTE — Progress Notes (Signed)
06/28/13 1229  Subjective Assessment  Subjective I have a bad foot  Wound 06/27/13 Diabetic ulcer Foot Right;Distal foot ulcer, I&D  Date First Assessed: 06/27/13   Wound Type: Diabetic ulcer  Location: Foot  Location Orientation: Right;Distal  Wound Description (Comments): foot ulcer, I&D  Present on Admission: Yes  Site / Wound Assessment Bleeding;Black;Dusky (profuse bleeding from medial incision, pulsing.)  % Wound base Red or Granulating 0%  % Wound base Black 60%  % Wound base Other (Comment) 40%  Peri-wound Assessment Edema;Maceration;Pink;Purple  Closure None  Drainage Amount Copious (copious blood form medial wound.)  Drainage Description Sanguineous;Purulent;Odor  Non-staged Wound Description Full thickness  Treatment Cleansed;Hydrotherapy (Pulse lavage)  Dressing Type Gauze (Comment);Moist to dry  Dressing Changed Changed  Dressing Status Clean  Hydrotherapy  Pulsed Lavage with Suction (psi) 8 psi  Pulsed Lavage with Suction - Normal Saline Used 500 mL  Pulsed Lavage Tip Tip with splash shield  Pulsed lavage therapy - wound location right foot  Wound Therapy - Assess/Plan/Recommendations  Wound Therapy - Clinical Statement Pt is going to [facility] for cardiac catheterization today(9/12). Then , at some point, Dr. Magnus Ivan is recommending BKA. PLS is providing no change in wound. Medial wound on plantar foot bleeding profusely, only able to repack quickly with guaze  to prevent bleeding..Pt will benefit from PT consult for mobility once stable and post-op.   Wound Therapy - Functional Problem List decr amb  Factors Delaying/Impairing Wound Healing Altered sensation;Diabetes Mellitus;Immobility;Multiple medical problems  Hydrotherapy Plan Debridement;Dressing change;Patient/family education;Pulsatile lavage with suction  Wound Therapy - Frequency 6X / week  Wound Therapy - Current Recommendations Other (comment) (surgery following)  Wound Plan continue with pulsed lavage for  cleansing and deb 6x per week until further plans/orders/surgery per ortho  Wound Therapy Goals - Improve the function of patient's integumentary system by progressing the wound(s) through the phases of wound healing by:  Decrease Necrotic Tissue to 90  Decrease Necrotic Tissue - Progress Not progressing  Increase Granulation Tissue to 10  Increase Granulation Tissue - Progress Mot progressing  Improve Drainage Characteristics Mod  Improve Drainage Characteristics - Progress Not met  Goals/treatment plan/discharge plan were made with and agreed upon by patient/family Yes  Time For Goal Achievement 2 weeks  Wound Therapy - Potential for Goals Poor  Blanchard Kelch PT [phone]

## 2013-06-28 NOTE — Progress Notes (Signed)
Patient ID: Albert [name], male   DOB: 10/14/1961, 52 y.o.   MRN: [id number] I spoke with Cardiology and they plan on cardiac cath this afternoon.  I will check on him late today and then can get a better picture to timing for his right BKA.  We discussed his situation in length.

## 2013-06-28 NOTE — H&P (View-Only) (Signed)
Patient ID: Albert [name], male   DOB: 02/06/1961, 52 y.o.   MRN: [id number] I spoke with Cardiology and they plan on cardiac cath this afternoon.  I will check on him late today and then can get a better picture to timing for his right BKA.  We discussed his situation in length. 

## 2013-06-28 NOTE — Progress Notes (Addendum)
Physical Therapy Discharge Patient Details Name: Albert [name] MRN: [id number] DOB: 1961-09-26 Today's Date: 06/28/2013 Time:13:30  -     Patient discharged from PT services secondary to surgery - will need to re-order PT to resume therapy services.Pt has been receiving PLS to R foot. BKA is planned. PLS is not indicated at this time. PT will sign off.  Please see latest therapy progress note for current level of functioning and progress toward goals.     GP     Sharen Heck PT [phone]  06/28/2013, 1:29 PM

## 2013-06-28 NOTE — Interval H&P Note (Signed)
History and Physical Interval Note:  06/28/2013 1:55 PM  Albert [name]  has presented today for cardiac cath with the diagnosis of NSTEMI.  The various methods of treatment have been discussed with the patient and family. After consideration of risks, benefits and other options for treatment, the patient has consented to  Procedure(s): LEFT HEART CATHETERIZATION WITH CORONARY ANGIOGRAM ([name]) as a surgical intervention .  The patient's history has been reviewed, patient examined, no change in status, stable for surgery.  I have reviewed the patient's chart and labs.  Questions were answered to the patient's satisfaction.    Cath Lab Visit (complete for each Cath Lab visit)  Clinical Evaluation Leading to the Procedure:   ACS: yes  Non-ACS:    Anginal Classification: CCS IV  Anti-ischemic medical therapy: Minimal Therapy (1 class of medications)  Non-Invasive Test Results: No non-invasive testing performed  Prior CABG: No previous CABG        Hammond Obeirne

## 2013-06-28 NOTE — Progress Notes (Signed)
PULMONARY  / CRITICAL CARE MEDICINE  Name: Albert [name] MRN: [id number] DOB: 1961-01-06    ADMISSION DATE:  06/25/2013 PROGRESS VISIT DATE: 06/28/2013  CHIEF COMPLAINT: Sepsis  BRIEF SUMMARY: 52 yo male with severe PAD and Rt foot abscess s/p I&D.  Developed hypotension, NSTEMI post-op.  PCCM assumed care (DF).  SIGNIFICANT EVENTS / STUDIES:  9/9 R foot XR >>> Extensive soft tissue air consistent with abscess tracking along the volar aspect of the foot somewhat diffusely but concentrated primarily at the level of the metatarsals.  9/9 OR >>> I&D of R foot abscess  9/10- trop pos, tachy 9/10 Echo >>> Left ventricle showed distal septal hypokinesis, EF low normal or mildly decreased   LINES / TUBES: Foley 9/9 >>>  CULTURES: 9/9 Blood >>>   ANTIBIOTICS: Zosyn 9/9 >>>  Vancomycin 9/9 >>>  Clindamycin 9/9 >>>  SUBJECTIVE:  Plan for cardiac cath later today.  Pain is better.  Denies chest pain/dyspnea.  VITAL SIGNS: Temp:  [98.5 F (36.9 C)-99.7 F (37.6 C)] 99.7 F (37.6 C) (09/12 0800) Pulse Rate:  [105] 105 (09/11 2133) Resp:  [16-23] 20 (09/12 0800) BP: (87-151)/(64-98) 151/98 mmHg (09/12 0800) SpO2:  [91 %-99 %] 94 % (09/12 0800) Weight:  [121.2 kg (267 lb 3.2 oz)] 121.2 kg (267 lb 3.2 oz) (09/12 0500) 2 liters [employer]  INTAKE / OUTPUT: Intake/Output     09/11 0701 - 09/12 0700 09/12 0701 - 09/13 0700   P.O. 720    I.V. (mL/kg) 2736.7 (22.6)    IV Piggyback 1337.5    Total Intake(mL/kg) 4794.2 (39.6)    Urine (mL/kg/hr) 1935 (0.7)    Total Output 1935     Net +2859.2            PHYSICAL EXAMINATION: General:  Pleasant, appears chronically ill Neuro:  Oriented x 3 HEENT:  PERRL Cardiovascular:  SR, tachycardic Lungs:  Bilateral CTA Abdomen:  Soft, NT, +BS Musculoskeletal:  Moves all extremities, surgical dressing on bilateral feet Skin:  intact  LABS:  CBC Recent Labs     06/26/13  0525  06/27/13  0200  06/28/13  0330  WBC  12.0*  10.3  11.4*  HGB   10.8*  10.5*  9.5*  HCT  32.9*  31.9*  28.4*  PLT  207  201  242   Coag's Recent Labs     06/28/13  0815  INR  1.22   BMET Recent Labs     06/27/13  0845  06/27/13  1140  06/28/13  0330  NA  126*  130*  131*  K  3.6  3.9  3.8  CL  94*  100  100  CO2  22  22  21   BUN  23  23  22   CREATININE  1.09  1.02  0.94  GLUCOSE  146*  172*  147*   Electrolytes Recent Labs     06/26/13  0100   06/27/13  0845  06/27/13  1140  06/28/13  0330  CALCIUM  8.5   < >  8.4  8.1*  8.2*  MG  1.7   --    --    --    --    < > = values in this interval not displayed.   Sepsis Markers Recent Labs     06/25/13  2310  06/27/13  0200  PROCALCITON  6.23  3.70   ABG No results found for this basename: PHART, PCO2ART, PO2ART,  in the last  72 hours  Liver Enzymes Recent Labs     06/27/13  0200  AST  64*  ALT  33  ALKPHOS  64  BILITOT  0.5  ALBUMIN  2.0*   Cardiac Enzymes Recent Labs     06/26/13  0100  06/26/13  0525  06/26/13  1135  TROPONINI  12.42*  10.97*  15.24*   Glucose Recent Labs     06/26/13  2243  06/27/13  0743  06/27/13  1134  06/27/13  1659  06/27/13  2104  06/28/13  0759  GLUCAP  119*  162*  170*  166*  124*  156*    Imaging Dg Chest Port 1 View  06/27/2013   *RADIOLOGY REPORT*  Clinical Data: Shortness of breath, evaluate endotracheal tube position  PORTABLE CHEST - 1 VIEW  Comparison: Chest x-ray of 01/15/2013  Findings: No endotracheal tube is visible.  The lungs are not as well aerated and may be mild pulmonary vascular congestion present with some basilar volume loss.  The heart is within upper limits of normal.  No bony abnormality is seen.  IMPRESSION: Diminished aeration.  Question mild pulmonary vascular congestion.   Original Report Authenticated By: Dwyane Dee, M.D.    ASSESSMENT / PLAN:  PULMONARY  A: No active issues.  P:  Goal SpO2>92  Supplemental oxygen PRN   CARDIOVASCULAR  A:  Sepsis secondary to soft tissue infection.  Sinus  tachycardia, normal physiologic response to fever / pain / hypovolemia but in setting ischemia  Hypotension in setting of sepsis and severe dehydration (improved)  NSTEMI  P:  ASA / Zocor / lopressor Heparin gtt  Cardiology rec cath 9/12, pt to be transported to [facility] 9/12  RENAL  A:  AKI 2nd to sepsis/hypotension >> improved. P:  Monitor renal fx, urine outpt, electrolytes  GASTROINTESTINAL  A:  Nutrition P:  NPO for cath Protonix for GI Px   HEMATOLOGIC  A:  Mild anemia.  P:  Trend CBC   INFECTIOUS  A:  R foot abscess in setting of DM, post debridement.  P:  Cultures and antibiotics as above  Clindamycin added to prevent toxin formation if necrotizing fasciitis, would consider to dc if hemodynamics improve  Ortho to schedule right BKA after clearance from Cardiology   ENDOCRINE  A:  DM. Hyperglycemia.  DKA (resolved)  P:  SSI  NEUROLOGIC  A:  Diabetic neuropathy. Post op pain.  Prior CVA w/ RUE def  P:  Fentanyl / Oxycodone PRN  Lyrica    06/28/2013, 9:07 AM  Reviewed above, examined pt, and agree with assessment/plan.  He is to have cardiac cath today.  Will keep on PCCM service for now.  If stable after cath and BKA, then likely can transfer to [facility] service.  Coralyn Helling, MD [facility] Pulmonary/Critical Care 06/28/2013, 11:21 AM Pager:  [phone] After 3pm call: [phone]

## 2013-06-28 NOTE — Progress Notes (Signed)
ANTICOAGULATION CONSULT NOTE  Pharmacy Consult for heparin Indication: chest pain/ACS  Allergies  Allergen Reactions  . Amoxicillin     vomiting    Patient Measurements: Height: 5\' 11"  (180.3 cm) Weight: 267 lb 3.2 oz (121.2 kg) IBW/kg (Calculated) : 75.3 Heparin Dosing Weight: 100kg  Vital Signs: Temp: 99.7 F (37.6 C) (09/12 1200) Temp src: Oral (09/12 1200) BP: 105/69 mmHg (09/12 1316) Pulse Rate: 102 (09/12 1405)  Labs:  Recent Labs  06/26/13 0100  06/26/13 0525  06/26/13 1135  06/27/13 0200 06/27/13 0845 06/27/13 1140 06/27/13 1655 06/28/13 0330 06/28/13 0815  HGB  --   < > 10.8*  --   --   --  10.5*  --   --   --  9.5*  --   HCT  --   --  32.9*  --   --   --  31.9*  --   --   --  28.4*  --   PLT  --   --  207  --   --   --  201  --   --   --  242  --   LABPROT  --   --   --   --   --   --   --   --   --   --   --  15.1  INR  --   --   --   --   --   --   --   --   --   --   --  1.22  HEPARINUNFRC  --   --   --   < >  --   < > <0.10* 0.24*  --  0.45 0.32  --   CREATININE 1.42*  --  1.26  < > 1.37*  < > 1.14 1.09 1.02  --  0.94  --   TROPONINI 12.42*  --  10.97*  --  15.24*  --   --   --   --   --   --   --   < > = values in this interval not displayed.  Estimated Creatinine Clearance: 124.6 ml/min (by C-G formula based on Cr of 0.94). Assessment: 52 year old male s/p cath found to have three vessel cad with only option of CABG. Previously therapeutic on heparin at 3000 units/hr (0.32-low end of goal) orders to restart heparin tonight. No bleeding/hematoma noted from cath site.  Goal of Therapy:  Heparin level 0.3-0.7 units/ml Monitor platelets by anticoagulation protocol: Yes   Plan:  Start heparin infusion at 3000 units/hr Check anti-Xa level in 6 hours and daily while on heparin Continue to monitor H&H and platelets  Sheppard Coil PharmD., BCPS Clinical Pharmacist Pager [phone] 06/28/2013 4:32 PM

## 2013-06-28 NOTE — Progress Notes (Signed)
SUBJECTIVE: No chest pain or SOB.   BP 136/81  Pulse 105  Temp(Src) 99.5 F (37.5 C) (Oral)  Resp 21  Ht 5\' 11"  (1.803 m)  Wt 267 lb 3.2 oz (121.2 kg)  BMI 37.28 kg/m2  SpO2 96%  Intake/Output Summary (Last 24 hours) at 06/28/13 0612 Last data filed at 06/28/13 4098  Gross per 24 hour  Intake 4592.73 ml  Output   1935 ml  Net 2657.73 ml    PHYSICAL EXAM General: Well developed, well nourished, in no acute distress. Alert and oriented x 3.  Psych:  Good affect, responds appropriately Neck: No JVD. No masses noted.  Lungs: Clear bilaterally with no wheezes or rhonci noted.  Heart: RRR with no murmurs noted. Abdomen: Bowel sounds are present. Soft, non-tender.  Extremities: Bandages over both feet.   LABS: Basic Metabolic Panel:  Recent Labs  11/91/47 1811 06/26/13 0100  06/27/13 1140 06/28/13 0330  NA 131* 134*  < > 130* 131*  K 3.8 3.7  < > 3.9 3.8  CL 97 104  < > 100 100  CO2 21 22  < > 22 21  GLUCOSE 307* 167*  < > 172* 147*  BUN 32* 25*  < > 23 22  CREATININE 1.64* 1.42*  < > 1.02 0.94  CALCIUM 9.9 8.5  < > 8.1* 8.2*  MG  --  1.7  --   --   --   < > = values in this interval not displayed. CBC:  Recent Labs  06/25/13 1135  06/27/13 0200 06/28/13 0330  WBC 17.1*  < > 10.3 11.4*  NEUTROABS 15.5*  --   --   --   HGB 12.5*  < > 10.5* 9.5*  HCT 36.9*  < > 31.9* 28.4*  MCV 80.0  < > 79.4 78.9  PLT 243  < > 201 242  < > = values in this interval not displayed. Cardiac Enzymes:  Recent Labs  06/26/13 0100 06/26/13 0525 06/26/13 1135  TROPONINI 12.42* 10.97* 15.24*   Current Meds: . aspirin  81 mg Oral Daily  . clindamycin (CLEOCIN) IV  900 mg Intravenous Q6H  . insulin aspart  0-15 Units Subcutaneous TID WC  . insulin aspart  0-5 Units Subcutaneous QHS  . insulin aspart  6 Units Subcutaneous TID WC  . insulin detemir  20 Units Subcutaneous Daily  . metoprolol tartrate  50 mg Oral BID  . pantoprazole  40 mg Oral Daily  .  piperacillin-tazobactam (ZOSYN)  IV  3.375 g Intravenous Q8H  . pregabalin  75 mg Oral BID  . simvastatin  40 mg Oral QPM  . vancomycin  1,500 mg Intravenous Q12H     ASSESSMENT AND PLAN: 52 yo male with history of HTN, HLD, prior CVA, DM, severe PAD admitted with right foot abscess, developed sepsis with hypotension and tachycardia. He has since ruled in for an MI with troponin of 15.   1. NSTEMI: Pt with known severe PAD and uncontrolled DM, HTN who likely has obstructive CAD. At this time he has no ischemic EKG changes or c/o chest pain. Telemetry shows sinus tach. Echo with apical hypokinesis with overall low normal LVEF. His elevated troponin is likely related to his underlying sepsis with hypotension in setting of probable high grade CAD. Recommend cardiac cath to define CAD before proceeding with BKA. Will arrange cath today at [facility]. Continue heparin drip, ASA, beta blocker and statin for now. Clear breakfast then NPO. Cath will be  after lunch. -Will touch base with ortho today to see if they could perform his BKA on Plavix as this will be necessary if a coronary stent is placed today.   2. Right foot abscess: Surgery following. S/p debridement 06/26/13. Possible BKA in next several days.       MCALHANY,CHRISTOPHER  9/12/20146:12 AM

## 2013-06-28 NOTE — CV Procedure (Addendum)
   Cardiac Catheterization Operative Report  Albert [name] [id number] 9/12/20142:47 PM Lia Hopping A, MD  Procedure Performed:  1. Left Heart Catheterization 2. Selective Coronary Angiography 3. Left ventricular angiogram  Operator: Verne Carrow, MD  Indication: 52 yo WM with uncontrolled DM and HTN, severe PAD admitted with septic shock secondary to necrotic right foot. No chest pain but troponin elevated in setting of profound hypotension. Plans for BKA on right but cardiac cath to exclude CAD before BKA.                                       Procedure Details: The risks, benefits, complications, treatment options, and expected outcomes were discussed with the patient. The patient and/or family concurred with the proposed plan, giving informed consent. The patient was brought to the cath lab after IV hydration was begun and oral premedication was given. The patient was further sedated with Versed and Fentanyl. The left groin was prepped and draped in the usual manner. Using the modified Seldinger access technique, a 5 French sheath was placed in the left femoral artery. Standard diagnostic catheters were used to perform selective coronary angiography. A pigtail catheter was used to perform a left ventricular angiogram.  There were no immediate complications. The patient was taken to the recovery area in stable condition.   Hemodynamic Findings: Central aortic pressure: 90/61 Left ventricular pressure: 90/19/29  Angiographic Findings:  Left main: Short segment without obstruction.   Left Anterior Descending Artery: Moderate caliber diffusely diseased vessel that courses to the apex. The proximal vessel has diffuse 40% stenosis. The mid vessel has a 70% stenosis followed by a 60% stenosis. The distal vessel has diffuse 90% stenosis. The diagonal is a moderate caliber vessel with ostial 99% stenosis with 99% disease extending into the mid vessel.   Circumflex Artery: Large  caliber vessel with proximal 80-90% stenosis. There are three small to moderate caliber obtuse marginal branches. The first OM has mid 80% stenosis. The second OM has a proximal 99% stenosis. The third OM has diffuse 99% stenosis. The left sided posterolateral branch has 99% stenosis.   Right Coronary Artery: Small non-dominant vessel with mid 99% stenosis.   Left Ventricular Angiogram: LVEF=40%  Impression: 1. Triple vessel CAD 2. NSTEMI secondary to demand ischemia from sepsis with severe underlying CAD 3. Mild LV systolic dysfunction  Recommendations: Complex situation. The only option for coronary revascularization is CABG. He is not a favorable candidate for CABG at this time with necrotic right foot, sepsis. Even though not ideal, would favor medical management of CAD while undergoing treatment of his necrotic foot. Would proceed with right BKA this weekend. I have discussed the case with CT surgery who agrees that CABG is not a good option at this time. Will ask CT surgery to see after he recovers from his BKA. Continue ASA, beta blocker, statin.        Complications:  None. The patient tolerated the procedure well.

## 2013-06-28 NOTE — Progress Notes (Signed)
CRITICAL VALUE ALERT  Critical value received:  Positive blood cultures  Date of notification:  06/28/13  Time of notification:    Critical value read back:yes  Nurse who received alert:  Sherlene Shams, RN  MD notified (1st page):  Elmore Guise; Sood  Time of first page:  1241  MD notified (2nd page):  Time of second page:  Responding MD:  Satira Mccallum  Time MD responded:  1245, 1250, 1252  no new orders given

## 2013-06-29 ENCOUNTER — Encounter (HOSPITAL_COMMUNITY): Payer: Self-pay | Admitting: Anesthesiology

## 2013-06-29 ENCOUNTER — Encounter (HOSPITAL_COMMUNITY)
Admission: EM | Disposition: A | Discharge status: Rehabilitation Facility | Payer: Self-pay | Source: Home / Self Care | Attending: Internal Medicine

## 2013-06-29 ENCOUNTER — Inpatient Hospital Stay (HOSPITAL_COMMUNITY): Payer: 59 | Admitting: Anesthesiology

## 2013-06-29 DIAGNOSIS — A4101 Sepsis due to Methicillin susceptible Staphylococcus aureus: Principal | ICD-10-CM

## 2013-06-29 DIAGNOSIS — E1159 Type 2 diabetes mellitus with other circulatory complications: Secondary | ICD-10-CM

## 2013-06-29 DIAGNOSIS — L97509 Non-pressure chronic ulcer of other part of unspecified foot with unspecified severity: Secondary | ICD-10-CM

## 2013-06-29 DIAGNOSIS — I96 Gangrene, not elsewhere classified: Secondary | ICD-10-CM

## 2013-06-29 HISTORY — PX: AMPUTATION: SHX166

## 2013-06-29 LAB — GLUCOSE, CAPILLARY
Glucose-Capillary: 164 mg/dL — ABNORMAL HIGH (ref 70–99)
Glucose-Capillary: 156 mg/dL — ABNORMAL HIGH (ref 70–99)
Glucose-Capillary: 181 mg/dL — ABNORMAL HIGH (ref 70–99)
Glucose-Capillary: 186 mg/dL — ABNORMAL HIGH (ref 70–99)

## 2013-06-29 LAB — BASIC METABOLIC PANEL
CO2: 22 mEq/L (ref 19–32)
Chloride: 100 mEq/L (ref 96–112)
Glucose, Bld: 167 mg/dL — ABNORMAL HIGH (ref 70–99)
Potassium: 3.7 mEq/L (ref 3.5–5.1)
Sodium: 132 mEq/L — ABNORMAL LOW (ref 135–145)

## 2013-06-29 LAB — CBC
Hemoglobin: 8.9 g/dL — ABNORMAL LOW (ref 13.0–17.0)
RBC: 3.36 MIL/uL — ABNORMAL LOW (ref 4.22–5.81)

## 2013-06-29 LAB — VANCOMYCIN, TROUGH: Vancomycin Tr: 9.5 ug/mL — ABNORMAL LOW (ref 10.0–20.0)

## 2013-06-29 LAB — TYPE AND SCREEN: ABO/RH(D): A POS

## 2013-06-29 SURGERY — AMPUTATION BELOW KNEE
Anesthesia: General | Site: Leg Lower | Laterality: Right | Wound class: Dirty or Infected

## 2013-06-29 MED ORDER — ONDANSETRON HCL 4 MG/2ML IJ SOLN: 4.0000 mg | Freq: Four times a day (QID) | INTRAMUSCULAR | Status: DC | PRN

## 2013-06-29 MED ORDER — LACTATED RINGERS IV SOLN
INTRAVENOUS | Status: DC | PRN
  Administered 2013-06-29: 08:00:00 via INTRAVENOUS

## 2013-06-29 MED ORDER — PHENYLEPHRINE HCL 10 MG/ML IJ SOLN
10.0000 mg | INTRAVENOUS | Status: DC | PRN
  Administered 2013-06-29: 40 ug/min via INTRAVENOUS

## 2013-06-29 MED ORDER — HYDROMORPHONE HCL PF 1 MG/ML IJ SOLN
INTRAMUSCULAR | Status: AC
  Administered 2013-06-29: 0.5 mg via INTRAVENOUS
  Filled 2013-06-29: qty 1

## 2013-06-29 MED ORDER — METOCLOPRAMIDE HCL 5 MG/ML IJ SOLN
5.0000 mg | Freq: Three times a day (TID) | INTRAMUSCULAR | Status: DC | PRN
  Filled 2013-06-29: qty 2

## 2013-06-29 MED ORDER — MEPERIDINE HCL 25 MG/ML IJ SOLN: 6.2500 mg | INTRAMUSCULAR | Status: DC | PRN

## 2013-06-29 MED ORDER — PROMETHAZINE HCL 25 MG/ML IJ SOLN: 6.2500 mg | INTRAMUSCULAR | Status: DC | PRN

## 2013-06-29 MED ORDER — FENTANYL CITRATE 0.05 MG/ML IJ SOLN
INTRAMUSCULAR | Status: DC | PRN
  Administered 2013-06-29: 100 ug via INTRAVENOUS
  Administered 2013-06-29: 50 ug via INTRAVENOUS

## 2013-06-29 MED ORDER — MIDAZOLAM HCL 2 MG/2ML IJ SOLN: 0.5000 mg | Freq: Once | INTRAMUSCULAR | Status: DC | PRN

## 2013-06-29 MED ORDER — ONDANSETRON HCL 4 MG PO TABS: 4.0000 mg | ORAL_TABLET | Freq: Four times a day (QID) | ORAL | Status: DC | PRN

## 2013-06-29 MED ORDER — METOCLOPRAMIDE HCL 10 MG PO TABS: 5.0000 mg | ORAL_TABLET | Freq: Three times a day (TID) | ORAL | Status: DC | PRN

## 2013-06-29 MED ORDER — CEFAZOLIN SODIUM-DEXTROSE 2-3 GM-% IV SOLR: 2.0000 g | Freq: Three times a day (TID) | INTRAVENOUS | Status: DC

## 2013-06-29 MED ORDER — CEFAZOLIN SODIUM-DEXTROSE 2-3 GM-% IV SOLR
2.0000 g | Freq: Three times a day (TID) | INTRAVENOUS | Status: AC
  Administered 2013-06-29: 2 g via INTRAVENOUS
  Filled 2013-06-29: qty 50

## 2013-06-29 MED ORDER — LIDOCAINE HCL (CARDIAC) 20 MG/ML IV SOLN
INTRAVENOUS | Status: DC | PRN
  Administered 2013-06-29: 20 mg via INTRAVENOUS

## 2013-06-29 MED ORDER — HYDROMORPHONE HCL PF 1 MG/ML IJ SOLN
0.5000 mg | INTRAMUSCULAR | Status: DC | PRN
  Administered 2013-06-29 – 2013-07-01 (×8): 1 mg via INTRAVENOUS
  Filled 2013-06-29 (×8): qty 1

## 2013-06-29 MED ORDER — SODIUM CHLORIDE 0.9 % IV SOLN
INTRAVENOUS | Status: DC
  Administered 2013-06-29: 17:00:00 via INTRAVENOUS
  Administered 2013-07-01: 04:00:00 10 mL/h via INTRAVENOUS

## 2013-06-29 MED ORDER — 0.9 % SODIUM CHLORIDE (POUR BTL) OPTIME
TOPICAL | Status: DC | PRN
  Administered 2013-06-29: 1000 mL

## 2013-06-29 MED ORDER — HYDROMORPHONE HCL PF 1 MG/ML IJ SOLN
0.2500 mg | INTRAMUSCULAR | Status: DC | PRN
  Administered 2013-06-29 (×4): 0.5 mg via INTRAVENOUS

## 2013-06-29 MED ORDER — OXYCODONE HCL 5 MG/5ML PO SOLN: 5.0000 mg | Freq: Once | ORAL | Status: DC | PRN

## 2013-06-29 MED ORDER — SUCCINYLCHOLINE CHLORIDE 20 MG/ML IJ SOLN
INTRAMUSCULAR | Status: DC | PRN
  Administered 2013-06-29: 140 mg via INTRAVENOUS

## 2013-06-29 MED ORDER — METOPROLOL TARTRATE 1 MG/ML IV SOLN
INTRAVENOUS | Status: DC | PRN
  Administered 2013-06-29 (×5): 1 mg via INTRAVENOUS

## 2013-06-29 MED ORDER — OXYCODONE HCL 5 MG PO TABS: 5.0000 mg | ORAL_TABLET | Freq: Once | ORAL | Status: DC | PRN

## 2013-06-29 MED ORDER — PROPOFOL 10 MG/ML IV BOLUS
INTRAVENOUS | Status: DC | PRN
  Administered 2013-06-29: 100 mg via INTRAVENOUS

## 2013-06-29 MED ORDER — MIDAZOLAM HCL 5 MG/5ML IJ SOLN
INTRAMUSCULAR | Status: DC | PRN
  Administered 2013-06-29: 2 mg via INTRAVENOUS

## 2013-06-29 MED ORDER — VANCOMYCIN HCL 10 G IV SOLR
1750.0000 mg | Freq: Two times a day (BID) | INTRAVENOUS | Status: DC
  Administered 2013-06-29: 1750 mg via INTRAVENOUS
  Filled 2013-06-29 (×3): qty 1750

## 2013-06-29 SURGICAL SUPPLY — 45 items
BANDAGE ESMARK 6X9 LF (GAUZE/BANDAGES/DRESSINGS) ×1 IMPLANT
BANDAGE GAUZE ELAST BULKY 4 IN (GAUZE/BANDAGES/DRESSINGS) ×4 IMPLANT
BLADE SAW RECIP 87.9 MT (BLADE) ×2 IMPLANT
BLADE SURG 21 STRL SS (BLADE) ×2 IMPLANT
BNDG COHESIVE 6X5 TAN STRL LF (GAUZE/BANDAGES/DRESSINGS) ×2 IMPLANT
BNDG ESMARK 6X9 LF (GAUZE/BANDAGES/DRESSINGS) ×2
CANISTER SUCTION 2500CC (MISCELLANEOUS) ×2 IMPLANT
CLOTH BEACON ORANGE TIMEOUT ST (SAFETY) IMPLANT
COVER SURGICAL LIGHT HANDLE (MISCELLANEOUS) ×2 IMPLANT
CUFF TOURNIQUET SINGLE 34IN LL (TOURNIQUET CUFF) ×2 IMPLANT
CUFF TOURNIQUET SINGLE 44IN (TOURNIQUET CUFF) IMPLANT
DRAIN PENROSE 1/2X12 LTX STRL (WOUND CARE) IMPLANT
DRAPE EXTREMITY T 121X128X90 (DRAPE) ×2 IMPLANT
DRAPE PROXIMA HALF (DRAPES) ×4 IMPLANT
DRAPE U-SHAPE 47X51 STRL (DRAPES) ×4 IMPLANT
DRSG ADAPTIC 3X8 NADH LF (GAUZE/BANDAGES/DRESSINGS) ×2 IMPLANT
DRSG PAD ABDOMINAL 8X10 ST (GAUZE/BANDAGES/DRESSINGS) ×6 IMPLANT
DURAPREP 26ML APPLICATOR (WOUND CARE) ×2 IMPLANT
ELECT REM PT RETURN 9FT ADLT (ELECTROSURGICAL) ×2
ELECTRODE REM PT RTRN 9FT ADLT (ELECTROSURGICAL) ×1 IMPLANT
EVACUATOR 1/8 PVC DRAIN (DRAIN) IMPLANT
GLOVE BIOGEL PI IND STRL 9 (GLOVE) ×1 IMPLANT
GLOVE BIOGEL PI INDICATOR 9 (GLOVE) ×1
GLOVE SURG ORTHO 9.0 STRL STRW (GLOVE) ×4 IMPLANT
GOWN PREVENTION PLUS XLARGE (GOWN DISPOSABLE) ×2 IMPLANT
GOWN SRG XL XLNG 56XLVL 4 (GOWN DISPOSABLE) ×1 IMPLANT
GOWN STRL NON-REIN XL XLG LVL4 (GOWN DISPOSABLE) ×1
KIT BASIN OR (CUSTOM PROCEDURE TRAY) ×2 IMPLANT
KIT ROOM TURNOVER OR (KITS) ×2 IMPLANT
MANIFOLD NEPTUNE II (INSTRUMENTS) IMPLANT
NS IRRIG 1000ML POUR BTL (IV SOLUTION) ×2 IMPLANT
PACK GENERAL/GYN (CUSTOM PROCEDURE TRAY) ×2 IMPLANT
PAD ARMBOARD 7.5X6 YLW CONV (MISCELLANEOUS) ×4 IMPLANT
SPONGE GAUZE 4X4 12PLY (GAUZE/BANDAGES/DRESSINGS) ×2 IMPLANT
SPONGE LAP 18X18 X RAY DECT (DISPOSABLE) IMPLANT
STAPLER VISISTAT 35W (STAPLE) ×2 IMPLANT
STOCKINETTE IMPERVIOUS LG (DRAPES) ×2 IMPLANT
SUT PDS AB 1 CT  36 (SUTURE)
SUT PDS AB 1 CT 36 (SUTURE) IMPLANT
SUT SILK 2 0 (SUTURE) ×1
SUT SILK 2-0 18XBRD TIE 12 (SUTURE) ×1 IMPLANT
TOWEL OR 17X24 6PK STRL BLUE (TOWEL DISPOSABLE) ×2 IMPLANT
TOWEL OR 17X26 10 PK STRL BLUE (TOWEL DISPOSABLE) ×2 IMPLANT
TUBE ANAEROBIC SPECIMEN COL (MISCELLANEOUS) IMPLANT
WATER STERILE IRR 1000ML POUR (IV SOLUTION) IMPLANT

## 2013-06-29 NOTE — Op Note (Signed)
OPERATIVE REPORT  DATE OF SURGERY: 06/29/2013  PATIENT:  Albert [name],  52 y.o. male  PRE-OPERATIVE DIAGNOSIS:  servere right foot infection/necrosis  POST-OPERATIVE DIAGNOSIS:  Gangrene right foot  PROCEDURE:  Procedure(s): AMPUTATION BELOW KNEE right  SURGEON:  Surgeon(s): Nadara Mustard, MD  ANESTHESIA:   general  EBL:  Minimal ML  SPECIMEN:  Source of Specimen:  Right leg  TOURNIQUET:   Total Tourniquet Time Documented: Thigh (Right) - 9 minutes Total: Thigh (Right) - 9 minutes   PROCEDURE DETAILS: Patient is a 52 year old gentleman diabetic insensate neuropathy Charcot collapse right foot who has undergone previous right foot salvage surgery he presents at this time with gangrenous changes over the entire plantar aspect of the foot with exposed bone and osteomyelitis. Due to failure of limb salvage patient presents at this time for transtibial amputation. Risks and benefits were discussed including infection neurovascular injury pain nonhealing of the wound need for additional surgery need for higher level amputation. Patient states he understands and wished to proceed at this time. Description of procedure patient was brought to the operating room and underwent a general anesthetic. After adequate levels of anesthesia were obtained patient's right lower extremity was prepped using DuraPrep draped into a sterile field and the foot was draped out of the sterile field with an impervious stockinette. A transverse incision was made 11 cm distal to the tibial tubercle this curved proximally and a large posterior flap was created. The tibia was transected and beveled just proximal to the skin incision and the fibula was transected just proximal to the tibial incision. Amputation knife was used to create a large posterior flap. The sciatic nerve was pulled cut and allowed to retract. The vascular bundles were suture ligated with 2-0 silk. The tourniquet was deflated hemostasis was  obtained. The deep and superficial fascial layers were closed using #1 PDS. The skin was closed using staples. The wound was covered with Adaptic orthopedic sponges AB dressing Kerlix and Coban. Patient was extubated taken to the PACU in stable condition.  PLAN OF CARE: Admit to inpatient   PATIENT DISPOSITION:  PACU - hemodynamically stable.   Nadara Mustard, MD 06/29/2013 8:42 AM

## 2013-06-29 NOTE — Progress Notes (Signed)
Admission note:   Arrival Method: Transfer from ICU Mental Status: A&Ox4 Telemetry: #15  Skin: DSD to left groin, DSD to left foot with amputated toes, post-op pressure dressing to right BKA.  Tubes: Foley catheter. IV: LLFA 06/25/13 NS@10ml /hr, Heparin@33ml /hr Pain: Medicated with IV Dilaudid  Family: No one at bedside currently. Living Situation: Home with family. Safety Measures: Call bell within reach. 5W Orientation: Oriented to unit and surroundings. Meal order placed.   Albert [name], Albert [name]

## 2013-06-29 NOTE — Transfer of Care (Signed)
Immediate Anesthesia Transfer of Care Note  Patient: Albert [name]  Procedure(s) Performed: Procedure(s): AMPUTATION BELOW KNEE (Right)  Patient Location: PACU  Anesthesia Type:General  Level of Consciousness: awake  Airway & Oxygen Therapy: Patient Spontanous Breathing and Patient connected to nasal cannula oxygen  Post-op Assessment: Report given to PACU RN and Post -op Vital signs reviewed and stable  Post vital signs: Reviewed and stable  Complications: No apparent anesthesia complications

## 2013-06-29 NOTE — Anesthesia Procedure Notes (Signed)
Procedure Name: Intubation Date/Time: 06/29/2013 8:01 AM Performed by: Alanda Amass A Pre-anesthesia Checklist: Patient identified, Timeout performed, Emergency Drugs available, Suction available and Patient being monitored Patient Re-evaluated:Patient Re-evaluated prior to inductionOxygen Delivery Method: Circle system utilized Preoxygenation: Pre-oxygenation with 100% oxygen Intubation Type: IV induction Ventilation: Mask ventilation without difficulty Laryngoscope Size: Mac and 3 Grade View: Grade III Tube type: Oral Tube size: 7.5 mm Number of attempts: 1 Airway Equipment and Method: Stylet Secured at: 23 cm Tube secured with: Tape Dental Injury: Teeth and Oropharynx as per pre-operative assessment

## 2013-06-29 NOTE — Consult Note (Signed)
  Patient is a 52 year old gentleman seen in consultation for gangrenous changes to Charcot collapse right foot.  On examination the entire plantar aspect of the patient's right foot is gangrenous. There is exposed bone there is a Charcot rocker-bottom deformity. The necrotic changes did not extend proximal to the ankle.  I have discussed the patient recommendations to proceed with a transtibial amputation. Patient agrees to proceeding with the surgical procedure. Risks and benefits were discussed including infection neurovascular injury pain phantom pain need for additional surgery. Patient states he understands and wished to proceed at this time.

## 2013-06-29 NOTE — Anesthesia Preprocedure Evaluation (Addendum)
Anesthesia Evaluation  Patient identified by MRN, date of birth, ID band Patient awake    Reviewed: Allergy & Precautions, H&P , NPO status , Patient's Chart, lab work & pertinent test results, reviewed documented beta blocker date and time   History of Anesthesia Complications Negative for: history of anesthetic complications  Airway Mallampati: II TM Distance: >3 FB Neck ROM: Full    Dental  (+) Poor Dentition and Dental Advisory Given   Pulmonary former smoker,  breath sounds clear to auscultation        Cardiovascular hypertension, Pt. on medications and Pt. on home beta blockers + Past MI (NSTEMI: on heparin gtt) and + Peripheral Vascular Disease Rhythm:Regular Rate:Tachycardia  Cath yesterday: severe 3v ASCADz, EF 40%   Neuro/Psych CVA (R hand), Residual Symptoms    GI/Hepatic negative GI ROS, Neg liver ROS,   Endo/Other  diabetes (glu 164), Poorly Controlled, Type obesity  Renal/GU H/o ATN: resolved, creat 0.91     Musculoskeletal   Abdominal (+) + obese,   Peds  Hematology   Anesthesia Other Findings   Reproductive/Obstetrics                           Anesthesia Physical Anesthesia Plan  ASA: IV  Anesthesia Plan: General   Post-op Pain Management:    Induction: Intravenous  Airway Management Planned: Oral ETT  Additional Equipment: Arterial line  Intra-op Plan:   Post-operative Plan: Extubation in OR  Informed Consent: I have reviewed the patients History and Physical, chart, labs and discussed the procedure including the risks, benefits and alternatives for the proposed anesthesia with the patient or authorized representative who has indicated his/her understanding and acceptance.   Dental advisory given  Plan Discussed with: CRNA and Surgeon  Anesthesia Plan Comments: (Plan routine monitors, A line, GETA)        Anesthesia Quick Evaluation

## 2013-06-29 NOTE — Anesthesia Postprocedure Evaluation (Signed)
  Anesthesia Post-op Note  Patient: SHAQUILLE JANES  Procedure(s) Performed: Procedure(s): AMPUTATION BELOW KNEE (Right)  Patient Location: PACU  Anesthesia Type:General  Level of Consciousness: awake, alert , oriented and patient cooperative  Airway and Oxygen Therapy: Patient Spontanous Breathing and Patient connected to nasal cannula oxygen  Post-op Pain: none  Post-op Assessment: Post-op Vital signs reviewed, Patient's Cardiovascular Status Stable, Respiratory Function Stable, Patent Airway, No signs of Nausea or vomiting and Pain level controlled  Post-op Vital Signs: Reviewed and stable  Complications: No apparent anesthesia complications

## 2013-06-29 NOTE — Progress Notes (Signed)
PULMONARY  / CRITICAL CARE MEDICINE  Name: Albert [name] MRN: [id number] DOB: [birth year]    ADMISSION DATE:  06/25/2013 PROGRESS VISIT DATE: 06/28/2013  CHIEF COMPLAINT: Sepsis  BRIEF SUMMARY: 52 yo male with severe PAD and Rt foot abscess s/p I&D.  Developed hypotension, NSTEMI post-op.  PCCM assumed care (DF).  SIGNIFICANT EVENTS / STUDIES:  9/9 R foot XR >>> Extensive soft tissue air consistent with abscess tracking along the volar aspect of the foot somewhat diffusely but concentrated primarily at the level of the metatarsals.  9/9 OR >>> I&D of R foot abscess  9/10- trop pos, tachy 9/10 Echo >>> Left ventricle showed distal septal hypokinesis, EF low normal or mildly decreased  9/12 cath >>3V CAD, EF 40%, CABG vs med mx 9/13 Rt BKA Lajoyce Corners)  LINES / TUBES: Foley 9/9 >>>  CULTURES: 9/9 Blood >>> staph >>  ANTIBIOTICS: Zosyn 9/9 >>> 9/13 Vancomycin 9/9 >>>  Clindamycin 9/9 >>>  SUBJECTIVE:  S/p amputation   Denies chest pain/dyspnea.  VITAL SIGNS: Temp:  [97.6 F (36.4 C)-99.7 F (37.6 C)] 97.6 F (36.4 C) (09/13 0940) Pulse Rate:  [95-113] 101 (09/13 0945) Resp:  [8-27] 11 (09/13 0945) BP: (71-134)/(49-81) 115/75 mmHg (09/13 0915) SpO2:  [81 %-100 %] 96 % (09/13 0945) Arterial Line BP: (125-137)/(62-65) 132/62 mmHg (09/13 0945) Weight:  [121.8 kg (268 lb 8.3 oz)] 121.8 kg (268 lb 8.3 oz) (09/12 1620)   INTAKE / OUTPUT: Intake/Output     09/12 0701 - 09/13 0700 09/13 0701 - 09/14 0700   P.O.     I.V. (mL/kg) 811.3 (6.7) 200 (1.6)   IV Piggyback 600    Total Intake(mL/kg) 1411.3 (11.6) 200 (1.6)   Urine (mL/kg/hr) 1375 (0.5) 600 (1.5)   Blood  30 (0.1)   Total Output 1375 630   Net +36.3 -430          PHYSICAL EXAMINATION: General:  Pleasant, appears chronically ill Neuro:  Oriented x 3 HEENT:  PERRL Cardiovascular:  SR, tachycardic Lungs:  Bilateral CTA Abdomen:  Soft, NT, +BS Musculoskeletal: , surgical dressing on Lt foot, Rt BKA Skin:   intact  LABS:  CBC Recent Labs     06/27/13  0200  06/28/13  0330  06/29/13  0425  WBC  10.3  11.4*  14.7*  HGB  10.5*  9.5*  8.9*  HCT  31.9*  28.4*  26.6*  PLT  201  242  297   Coag's Recent Labs     06/28/13  0815  INR  1.22   BMET Recent Labs     06/27/13  1140  06/28/13  0330  06/29/13  0425  NA  130*  131*  132*  K  3.9  3.8  3.7  CL  100  100  100  CO2  22  21  22   BUN  23  22  20   CREATININE  1.02  0.94  0.91  GLUCOSE  172*  147*  167*   Electrolytes Recent Labs     06/27/13  1140  06/28/13  0330  06/29/13  0425  CALCIUM  8.1*  8.2*  8.3*   Sepsis Markers Recent Labs     06/27/13  0200  06/29/13  0425  PROCALCITON  3.70  0.98   ABG No results found for this basename: PHART, PCO2ART, PO2ART,  in the last 72 hours  Liver Enzymes Recent Labs     06/27/13  0200  AST  64*  ALT  33  ALKPHOS  64  BILITOT  0.5  ALBUMIN  2.0*   Cardiac Enzymes Recent Labs     06/26/13  1135  TROPONINI  15.24*   Glucose Recent Labs     06/28/13  1251  06/28/13  1617  06/28/13  2215  06/29/13  0712  06/29/13  0854  06/29/13  1003  GLUCAP  215*  188*  164*  164*  156*  181*    Imaging No results found.  ASSESSMENT / PLAN:  PULMONARY  A: No active issues.  P:  Goal SpO2>92  Supplemental oxygen PRN   CARDIOVASCULAR  A:  Sepsis -resolved NSTEMI -3V CAD P:  ASA / Zocor / lopressor Heparin gtt  Med mx for now - CABG eventually   RENAL  A:  AKI 2nd to sepsis/hypotension >> improved. P:  Monitor renal fx, urine outpt, electrolytes  GASTROINTESTINAL  A:  Nutrition P:  Advance PO Protonix for GI Px   HEMATOLOGIC  A:  Mild anemia.  P:  Trend CBC   INFECTIOUS  A: Staph sepsis R foot abscess s/p R -BKA  P:  Ct vanc - simplify once staph identified Dc Clindamycin    ENDOCRINE  A:  DM. Hyperglycemia.  DKA (resolved)  P:  SSI  NEUROLOGIC  A:  Diabetic neuropathy. Post op pain.  Prior CVA w/ RUE def  P:  Fentanyl /  Oxycodone PRN  Lyrica   Transfer to tele & to [facility] 9/14  Cyril Mourning MD. [facility]. [place] [facility] Pager [phone] If no response call [phone]   06/29/2013, 10:18 AM

## 2013-06-29 NOTE — Progress Notes (Signed)
ANTICOAGULATION CONSULT NOTE - Follow Up Consult  Pharmacy Consult for heparin Indication: NSTEMI  Allergies  Allergen Reactions  . Amoxicillin     vomiting    Patient Measurements: Height: 5\' 11"  (180.3 cm) Weight: 268 lb 8.3 oz (121.8 kg) IBW/kg (Calculated) : 75.3 Heparin Dosing Weight: 102kg  Vital Signs: Temp: 97.7 F (36.5 C) (09/13 1715) Temp src: Oral (09/13 1715) BP: 148/94 mmHg (09/13 1715) Pulse Rate: 111 (09/13 1715)  Labs:  Recent Labs  06/27/13 0200  06/27/13 1140  06/28/13 0330 06/28/13 0815 06/29/13 0425 06/29/13 0601 06/29/13 1930  HGB 10.5*  --   --   --  9.5*  --  8.9*  --   --   HCT 31.9*  --   --   --  28.4*  --  26.6*  --   --   PLT 201  --   --   --  242  --  297  --   --   LABPROT  --   --   --   --   --  15.1  --   --   --   INR  --   --   --   --   --  1.22  --   --   --   HEPARINUNFRC <0.10*  < >  --   < > 0.32  --   --  0.13* 0.65  CREATININE 1.14  < > 1.02  --  0.94  --  0.91  --   --   < > = values in this interval not displayed.  Estimated Creatinine Clearance: 129 ml/min (by C-G formula based on Cr of 0.91).   Medications:  Heparin @ 3300 units/hr  Assessment: 52 YOM who's heparin level was subtherapeutic this morning and rate was increased to 3300 units/hr. Heparin level is now therapeutic at 0.65. No bleeding noted.   Goal of Therapy:  Heparin level 0.3-0.7 units/ml Monitor platelets by anticoagulation protocol: Yes   Plan:  1. Continue heparin at 3300 units/hr 2. Confirm level at 0200 3. Daily HL and CBC 4. Follow for any signs of bleeding  Josiah Nieto D. Rilea Arutyunyan, PharmD Clinical Pharmacist Pager: [phone] 06/29/2013 8:27 PM

## 2013-06-29 NOTE — Progress Notes (Signed)
ANTIBIOTIC CONSULT NOTE - FOLLOW UP  Pharmacy Consult for Heparin, Vancomycin, Cefazolin Indication: NSTEMI, Staph aureus bacteremia  Allergies  Allergen Reactions  . Amoxicillin     vomiting    Patient Measurements: Height: 5\' 11"  (180.3 cm) Weight: 268 lb 8.3 oz (121.8 kg) IBW/kg (Calculated) : 75.3 Adjusted Body Weight: 102 kg (heparin dosing weight)  Vital Signs: Temp: 97.5 F (36.4 C) (09/13 1134) Temp src: Oral (09/13 1134) BP: 137/85 mmHg (09/13 1030) Pulse Rate: 109 (09/13 1030) Intake/Output from previous day: 09/12 0701 - 09/13 0700 In: 1411.3 [I.V.:811.3; IV Piggyback:600] Out: 1375 [Urine:1375] Intake/Output from this shift: Total I/O In: 450 [I.V.:200; IV Piggyback:250] Out: 630 [Urine:600; Blood:30]  Labs: Estimated Creatinine Clearance: 129 ml/min (by C-G formula based on Cr of 0.91).  Recent Labs  06/29/13 1125  VANCOTROUGH 9.5*   Recent Labs  06/27/13 0200  06/27/13 1140 06/27/13 1655 06/28/13 0330 06/28/13 0815 06/29/13 0425 06/29/13 0601  HGB 10.5*  --   --   --  9.5*  --  8.9*  --   HCT 31.9*  --   --   --  28.4*  --  26.6*  --   PLT 201  --   --   --  242  --  297  --   LABPROT  --   --   --   --   --  15.1  --   --   INR  --   --   --   --   --  1.22  --   --   HEPARINUNFRC <0.10*  < >  --  0.45 0.32  --   --  0.13*  CREATININE 1.14  < > 1.02  --  0.94  --  0.91  --   < > = values in this interval not displayed.  Microbiology: Recent Results (from the past 720 hour(s))  CULTURE, BLOOD (ROUTINE X 2)     Status: None   Collection Time    06/26/13  1:00 AM      Result Value Range Status   Specimen Description BLOOD RIGHT ARM   Final   Special Requests     Final   Value: BOTTLES DRAWN AEROBIC AND ANAEROBIC  ,   Culture  Setup Time     Final   Value: 06/26/2013 04:11     Performed at [facility]   Culture     Final   Value: STAPHYLOCOCCUS AUREUS     Note: Gram Stain Report Called to,Read Back By and  Verified With: STACY PHELPS 06/28/13 1155 BY [email or web address]     Performed at [facility]   Report Status PENDING   Incomplete  CULTURE, BLOOD (ROUTINE X 2)     Status: None   Collection Time    06/26/13  1:08 AM      Result Value Range Status   Specimen Description BLOOD RIGHT ARM   Final   Special Requests BOTTLES DRAWN AEROBIC AND ANAEROBIC    Final   Culture  Setup Time     Final   Value: 06/26/2013 04:10     Performed at [facility]   Culture     Final   Value: STAPHYLOCOCCUS AUREUS     Note: RIFAMPIN AND GENTAMICIN SHOULD NOT BE USED AS SINGLE DRUGS FOR TREATMENT OF STAPH INFECTIONS.     Note: Gram Stain Report Called to,Read Back By and Verified With: JOHN HUNT ON 06/28/2013 AT 8:21P BY Serafina Mitchell  Performed at [facility]   Report Status PENDING   Incomplete  MRSA PCR SCREENING     Status: None   Collection Time    06/26/13  8:08 AM      Result Value Range Status   MRSA by PCR NEGATIVE  NEGATIVE Final   Comment:            The GeneXpert MRSA Assay (FDA     approved for NASAL specimens     only), is one component of a     comprehensive MRSA colonization     surveillance program. It is not     intended to diagnose MRSA     infection nor to guide or     monitor treatment for     MRSA infections.    Anti-infectives   Start     Dose/Rate Route Frequency Ordered Stop   06/29/13 1400  ceFAZolin (ANCEF) IVPB 2 g/50 mL premix  Status:  Discontinued     2 g 100 mL/hr over 30 Minutes Intravenous 3 times per day 06/29/13 1018 06/29/13 1024   06/27/13 1000  vancomycin (VANCOCIN) 1,500 mg in sodium chloride 0.9 % 500 mL IVPB     1,500 mg 250 mL/hr over 120 Minutes Intravenous Every 12 hours 06/27/13 0740     06/26/13 1400  vancomycin (VANCOCIN) 1,500 mg in sodium chloride 0.9 % 500 mL IVPB  Status:  Discontinued     1,500 mg 250 mL/hr over 120 Minutes Intravenous Every 24 hours 06/25/13 1601 06/27/13 0740   06/26/13 0000  clindamycin (CLEOCIN) IVPB 900 mg   Status:  Discontinued     900 mg 100 mL/hr over 30 Minutes Intravenous 4 times per day 06/25/13 2327 06/29/13 1018   06/25/13 2118  polymyxin B 500,000 Units, bacitracin 50,000 Units in sodium chloride irrigation 0.9 % 500 mL irrigation  Status:  Discontinued       As needed 06/25/13 2120 06/25/13 2141   06/25/13 2000  piperacillin-tazobactam (ZOSYN) IVPB 3.375 g  Status:  Discontinued     3.375 g 12.5 mL/hr over 240 Minutes Intravenous Every 8 hours 06/25/13 1600 06/29/13 1018   06/25/13 1600  vancomycin (VANCOCIN) 500 mg in sodium chloride 0.9 % 100 mL IVPB     500 mg 100 mL/hr over 60 Minutes Intravenous STAT 06/25/13 1558 06/25/13 1815   06/25/13 1130  piperacillin-tazobactam (ZOSYN) IVPB 3.375 g     3.375 g 100 mL/hr over 30 Minutes Intravenous STAT 06/25/13 1048 06/25/13 1301   06/25/13 1100  vancomycin (VANCOCIN) IVPB 1000 mg/200 mL premix     1,000 mg 200 mL/hr over 60 Minutes Intravenous STAT 06/25/13 1048 06/25/13 1402      Assessment: NSTEMI, 3v CAD - continues on anticoagulation with Heparin.  His heparin level is now subtherapeutic on 3000 units/hr.  Confirmed with bedside RN and OR report that heparin was continued through his BKA procedure earlier today.  Staph aureus bacteremia - his Vancomycin trough was drawn ~2 hours late, but even with correction is still likely subtherapeutic.  Cefazolin is being added empirically until susceptibilities are available.  Goal of Therapy:  Vancomycin trough level 15-20 mcg/ml  Plan:  Increase Vancomycin to 1750mg  IV q12h Cefazolin 2gm IV q8h Await susceptibilities of Staph aureus Monitor renal function closely Increase Heparin to 3300 units/hr Check Heparin level in 6 hours.  Estella Husk, Pharm.D., BCPS, AAHIVP Clinical Pharmacist Phone: [phone] or [phone] 06/29/2013, 1:26 PM

## 2013-06-29 NOTE — Progress Notes (Signed)
Recent micro results show staph aureus bacteremia detected from recent blood culture, likely related to the patient's gangrenous foot. He is currently day #5 vanco, clinda, and piptazo. Recommend to change his antibiotics to cefazolin plus vancomycin for now. Clindamycin associated use for toxin inhibition is usually for 2-3 days, and can be discontinued thereafter. Will provide formal consultation shortly.  Duke Salvia Drue Second MD MPH [facility] [phone]

## 2013-06-29 NOTE — Consult Note (Addendum)
[facility]  Total days of antibiotics 5        Day 5 vanco        Day 5 clinda        Day 5 piptazo       Reason for Consult: SAB bacteremia    Referring Physician: alva  Principal Problem:   Foot abscess, right Active Problems:   Hypercholesteremia   Diabetes mellitus out of control   Essential hypertension, benign   Atherosclerotic peripheral vascular disease   AKI (acute kidney injury)   Sepsis   Dehydration with hyponatremia   Leukocytosis, unspecified   Sepsis(995.91)   SEMI (subendocardial myocardial infarction)    HPI: Albert [name] is a 52 y.o. male with HTN, CVA with residual RUE weakness, HLD, uncontrolled type II DM c/b peripheral neuropathy, charcot arthropathy, HTN,  multiple I&D's and amputation of multiple toes of left foot, and PVD, s/p left popliteal tibial bypass graft, was sent from the wound care center to the ED on 06/25/13 for evaluation of right gangrenous foot associated with severe pain, fevers, chills. He was found. afebrile, tachycardia, elevated creatinine 2.47 and WBC 17 K.  X-ray of the right foot suggestive of abscess. He was started on broad spectrum antibiotics of vanco, piptazo, clinda and underwent I&D  On 9/9 c/b post-op NSTEMI and hypotension and he further underwent right BKA 9/13. Infectious work up showed blood cx with Staph Aureus Bacteremia. For his cardiac ischemia, he underwent 9/10 Echo >>> Left ventricle showed distal septal hypokinesis, EF low normal or mildly decreased; 9/12 cath >>3V CAD, EF 40%, CABG vs med mx.  Currently he denies chills, or fevers, but has significant pain associated with his recent amputation of right lower leg.   Past Medical History  Diagnosis Date  . Hypertension   . Diabetes mellitus without complication ~2000    last HbA1c ~9  . Hypercholesteremia   . CVA (cerebral infarction) 2011    R hand deficit  . Chronic sinusitis   . Allergic rhinitis   . Chronic cough   .  Complication of anesthesia     couldn't swallow and talk    Allergies:  Allergies  Allergen Reactions  . Amoxicillin     vomiting   MEDICATIONS: . aspirin  81 mg Oral Daily  . insulin aspart  0-15 Units Subcutaneous TID WC  . insulin aspart  0-5 Units Subcutaneous QHS  . insulin aspart  6 Units Subcutaneous TID WC  . insulin detemir  20 Units Subcutaneous Daily  . metoprolol tartrate  50 mg Oral BID  . pantoprazole  40 mg Oral Daily  . pregabalin  75 mg Oral BID  . simvastatin  40 mg Oral QPM  . vancomycin  1,500 mg Intravenous Q12H    History  Substance Use Topics  . Smoking status: Former Games developer  . Smokeless tobacco: Never Used  . Alcohol Use: No    Family History  Problem Relation Age of Onset  . Diabetes    . Pancreatic cancer Mother   . Diabetes Mother   . Hyperlipidemia Mother   . Breast cancer Sister   . Depression Maternal Grandmother   . Heart disease Maternal Grandmother   . Depression Maternal Grandfather   . Heart disease Maternal Grandfather   . Depression Paternal Grandmother   . Heart disease Paternal Grandmother   . Depression Paternal Grandfather   . Heart disease Paternal Grandfather    Review of Systems  Constitutional:  Negative for fever, chills, diaphoresis, activity change, appetite change, fatigue and unexpected weight change.  HENT: Negative for congestion, sore throat, rhinorrhea, sneezing, trouble swallowing and sinus pressure.  Eyes: Negative for photophobia and visual disturbance.  Respiratory: Negative for cough, chest tightness, shortness of breath, wheezing and stridor.  Cardiovascular: Negative for chest pain, palpitations and leg swelling.  Gastrointestinal: Negative for nausea, vomiting, abdominal pain, diarrhea, constipation, blood in stool, abdominal distention and anal bleeding.  Genitourinary: Negative for dysuria, hematuria, flank pain and difficulty urinating.  Musculoskeletal: Negative for myalgias, back pain, joint  swelling, arthralgias and gait problem.  Skin: Negative for color change, pallor, rash and wound.  Neurological: + pain associated with right leg Hematological: Negative for adenopathy. Does not bruise/bleed easily.  Psychiatric/Behavioral: Negative for behavioral problems, confusion, sleep disturbance, dysphoric mood, decreased concentration and agitation.     OBJECTIVE: Temp:  [97.5 F (36.4 C)-99.3 F (37.4 C)] 97.5 F (36.4 C) (09/13 1134) Pulse Rate:  [95-113] 109 (09/13 1030) Resp:  [8-27] 14 (09/13 1015) BP: (71-137)/(49-85) 137/85 mmHg (09/13 1030) SpO2:  [81 %-100 %] 100 % (09/13 1030) Arterial Line BP: (125-166)/(62-82) 166/78 mmHg (09/13 1030) Weight:  [268 lb 8.3 oz (121.8 kg)] 268 lb 8.3 oz (121.8 kg) (09/12 1620)  Physical Exam  Constitutional: He is oriented to person, place, and time. He appears well-developed and well-nourished. No distress.  HENT:  Mouth/Throat: Oropharynx is clear and moist. No oropharyngeal exudate.  Cardiovascular: Normal rate, regular rhythm and normal heart sounds. Exam reveals no gallop and no friction rub.  No murmur heard.  Pulmonary/Chest: Effort normal and breath sounds normal. No respiratory distress. He has no wheezes.  Abdominal: Soft. Bowel sounds are normal. He exhibits no distension. There is no tenderness.  Lymphadenopathy:  He has no cervical adenopathy.  Neurological: He is alert and oriented to person, place, and time.  Skin: Skin is warm and dry. No rash noted. No erythema. 3 small reddish macules on tip of nose, no vesicles Psychiatric: He has a normal mood and affect. His behavior is normal.  Ext: right lower extremity is wrapped from recent BKA, no blood coming through bandages. Left foot bandaged, only great toe remains, other digits amputated.   LABS: Results for orders placed during the hospital encounter of 06/25/13 (from the past 48 hour(s))  HEPARIN LEVEL (UNFRACTIONATED)     Status: None   Collection Time     06/27/13  4:55 PM      Result Value Range   Heparin Unfractionated 0.45  0.30 - 0.70 IU/mL   Comment:            IF HEPARIN RESULTS ARE BELOW     EXPECTED VALUES, AND PATIENT     DOSAGE HAS BEEN CONFIRMED,     SUGGEST FOLLOW UP TESTING     OF ANTITHROMBIN III LEVELS.  GLUCOSE, CAPILLARY     Status: Abnormal   Collection Time    06/27/13  4:59 PM      Result Value Range   Glucose-Capillary 166 (*) 70 - 99 mg/dL  GLUCOSE, CAPILLARY     Status: Abnormal   Collection Time    06/27/13  9:04 PM      Result Value Range   Glucose-Capillary 124 (*) 70 - 99 mg/dL   Comment 1 Notify RN    HEPARIN LEVEL (UNFRACTIONATED)     Status: None   Collection Time    06/28/13  3:30 AM      Result Value Range  Heparin Unfractionated 0.32  0.30 - 0.70 IU/mL   Comment:            IF HEPARIN RESULTS ARE BELOW     EXPECTED VALUES, AND PATIENT     DOSAGE HAS BEEN CONFIRMED,     SUGGEST FOLLOW UP TESTING     OF ANTITHROMBIN III LEVELS.  BASIC METABOLIC PANEL     Status: Abnormal   Collection Time    06/28/13  3:30 AM      Result Value Range   Sodium 131 (*) 135 - 145 mEq/L   Potassium 3.8  3.5 - 5.1 mEq/L   Chloride 100  96 - 112 mEq/L   CO2 21  19 - 32 mEq/L   Glucose, Bld 147 (*) 70 - 99 mg/dL   BUN 22  6 - 23 mg/dL   Creatinine, Ser 1.61  0.50 - 1.35 mg/dL   Calcium 8.2 (*) 8.4 - 10.5 mg/dL   GFR calc non Af Amer >90  >90 mL/min   GFR calc Af Amer >90  >90 mL/min   Comment: (NOTE)     The eGFR has been calculated using the CKD EPI equation.     This calculation has not been validated in all clinical situations.     eGFR's persistently <90 mL/min signify possible Chronic Kidney     Disease.  CBC     Status: Abnormal   Collection Time    06/28/13  3:30 AM      Result Value Range   WBC 11.4 (*) 4.0 - 10.5 K/uL   RBC 3.60 (*) 4.22 - 5.81 MIL/uL   Hemoglobin 9.5 (*) 13.0 - 17.0 g/dL   HCT 09.6 (*) 04.5 - 40.9 %   MCV 78.9  78.0 - 100.0 fL   MCH 26.4  26.0 - 34.0 pg   MCHC 33.5  30.0 -  36.0 g/dL   RDW 81.1  91.4 - 78.2 %   Platelets 242  150 - 400 K/uL  GLUCOSE, CAPILLARY     Status: Abnormal   Collection Time    06/28/13  7:59 AM      Result Value Range   Glucose-Capillary 156 (*) 70 - 99 mg/dL   Comment 1 Documented in Chart     Comment 2 Notify RN    PROTIME-INR     Status: None   Collection Time    06/28/13  8:15 AM      Result Value Range   Prothrombin Time 15.1  11.6 - 15.2 seconds   INR 1.22  0.00 - 1.49  GLUCOSE, CAPILLARY     Status: Abnormal   Collection Time    06/28/13 12:51 PM      Result Value Range   Glucose-Capillary 215 (*) 70 - 99 mg/dL   Comment 1 Documented in Chart     Comment 2 Notify RN    POCT ACTIVATED CLOTTING TIME     Status: None   Collection Time    06/28/13  2:44 PM      Result Value Range   Activated Clotting Time 135    GLUCOSE, CAPILLARY     Status: Abnormal   Collection Time    06/28/13  4:17 PM      Result Value Range   Glucose-Capillary 188 (*) 70 - 99 mg/dL  URINALYSIS, ROUTINE W REFLEX MICROSCOPIC     Status: Abnormal   Collection Time    06/28/13  6:05 PM      Result Value Range  Color, Urine YELLOW  YELLOW   APPearance CLEAR  CLEAR   Specific Gravity, Urine 1.025  1.005 - 1.030   pH 6.0  5.0 - 8.0   Glucose, UA 100 (*) NEGATIVE mg/dL   Hgb urine dipstick LARGE (*) NEGATIVE   Bilirubin Urine NEGATIVE  NEGATIVE   Ketones, ur NEGATIVE  NEGATIVE mg/dL   Protein, ur 30 (*) NEGATIVE mg/dL   Urobilinogen, UA 4.0 (*) 0.0 - 1.0 mg/dL   Nitrite NEGATIVE  NEGATIVE   Leukocytes, UA NEGATIVE  NEGATIVE  URINE MICROSCOPIC-ADD ON     Status: Abnormal   Collection Time    06/28/13  6:05 PM      Result Value Range   WBC, UA 0-2  <3 WBC/hpf   RBC / HPF 7-10  <3 RBC/hpf   Bacteria, UA RARE  RARE   Casts HYALINE CASTS (*) NEGATIVE   Urine-Other AMORPHOUS URATES/PHOSPHATES    GLUCOSE, CAPILLARY     Status: Abnormal   Collection Time    06/28/13 10:15 PM      Result Value Range   Glucose-Capillary 164 (*) 70 - 99 mg/dL   CBC     Status: Abnormal   Collection Time    06/29/13  4:25 AM      Result Value Range   WBC 14.7 (*) 4.0 - 10.5 K/uL   RBC 3.36 (*) 4.22 - 5.81 MIL/uL   Hemoglobin 8.9 (*) 13.0 - 17.0 g/dL   HCT 40.9 (*) 81.1 - 91.4 %   MCV 79.2  78.0 - 100.0 fL   MCH 26.5  26.0 - 34.0 pg   MCHC 33.5  30.0 - 36.0 g/dL   RDW 78.2  95.6 - 21.3 %   Platelets 297  150 - 400 K/uL  BASIC METABOLIC PANEL     Status: Abnormal   Collection Time    06/29/13  4:25 AM      Result Value Range   Sodium 132 (*) 135 - 145 mEq/L   Potassium 3.7  3.5 - 5.1 mEq/L   Chloride 100  96 - 112 mEq/L   CO2 22  19 - 32 mEq/L   Glucose, Bld 167 (*) 70 - 99 mg/dL   BUN 20  6 - 23 mg/dL   Creatinine, Ser 0.86  0.50 - 1.35 mg/dL   Calcium 8.3 (*) 8.4 - 10.5 mg/dL   GFR calc non Af Amer >90  >90 mL/min   GFR calc Af Amer >90  >90 mL/min   Comment: (NOTE)     The eGFR has been calculated using the CKD EPI equation.     This calculation has not been validated in all clinical situations.     eGFR's persistently <90 mL/min signify possible Chronic Kidney     Disease.  PROCALCITONIN     Status: None   Collection Time    06/29/13  4:25 AM      Result Value Range   Procalcitonin 0.98     Comment:            Interpretation:     PCT > 0.5 ng/mL and <= 2 ng/mL:     Systemic infection (sepsis) is possible,     but other conditions are known to elevate     PCT as well.     (NOTE)             ICU PCT Algorithm               Non ICU PCT  Algorithm        ----------------------------     ------------------------------             PCT < 0.25 ng/mL                 PCT < 0.1 ng/mL         Stopping of antibiotics            Stopping of antibiotics           strongly encouraged.               strongly encouraged.        ----------------------------     ------------------------------           PCT level decrease by               PCT < 0.25 ng/mL           >= 80% from peak PCT           OR PCT 0.25 - 0.5 ng/mL          Stopping of  antibiotics                                                 encouraged.         Stopping of antibiotics               encouraged.        ----------------------------     ------------------------------           PCT level decrease by              PCT >= 0.25 ng/mL           < 80% from peak PCT            AND PCT >= 0.5 ng/mL            Continuing antibiotics                                                  encouraged.           Continuing antibiotics                encouraged.        ----------------------------     ------------------------------         PCT level increase compared          PCT > 0.5 ng/mL             with peak PCT AND              PCT >= 0.5 ng/mL             Escalation of antibiotics                                              strongly encouraged.          Escalation of antibiotics            strongly encouraged.  HEPARIN LEVEL (UNFRACTIONATED)     Status:  Abnormal   Collection Time    06/29/13  6:01 AM      Result Value Range   Heparin Unfractionated 0.13 (*) 0.30 - 0.70 IU/mL   Comment:            IF HEPARIN RESULTS ARE BELOW     EXPECTED VALUES, AND PATIENT     DOSAGE HAS BEEN CONFIRMED,     SUGGEST FOLLOW UP TESTING     OF ANTITHROMBIN III LEVELS.  GLUCOSE, CAPILLARY     Status: Abnormal   Collection Time    06/29/13  7:12 AM      Result Value Range   Glucose-Capillary 164 (*) 70 - 99 mg/dL  TYPE AND SCREEN     Status: None   Collection Time    06/29/13  7:55 AM      Result Value Range   ABO/RH(D) A POS     Antibody Screen NEG     Sample Expiration 07/02/2013    GLUCOSE, CAPILLARY     Status: Abnormal   Collection Time    06/29/13  8:54 AM      Result Value Range   Glucose-Capillary 156 (*) 70 - 99 mg/dL  GLUCOSE, CAPILLARY     Status: Abnormal   Collection Time    06/29/13 10:03 AM      Result Value Range   Glucose-Capillary 181 (*) 70 - 99 mg/dL  GLUCOSE, CAPILLARY     Status: Abnormal   Collection Time    06/29/13 11:09 AM      Result  Value Range   Glucose-Capillary 197 (*) 70 - 99 mg/dL  VANCOMYCIN, TROUGH     Status: Abnormal   Collection Time    06/29/13 11:25 AM      Result Value Range   Vancomycin Tr 9.5 (*) 10.0 - 20.0 ug/mL   MICRO: 9/10 blood cx : staph aureas 9/12 urine cx: NGTD  Assessment/Plan:  52yo M with poorly controlled DM, presents with SIRS and staph aureus bacteremia from gangrenous right foot s/p amputation.  - staph aureus bacteremia = continue on vancomycin and cefazolin for now until staph aureus sensitivity returns. Having b-lactam coverage empirically can be useful for MSSA than vanco alone.  - We will repeat blood culture today to document bacteremia clearance  - source control is presumably achieved since he has recent BKA - TTE is negative; pending repeat blood cultures results, will discuss the need for TEE to rule out endocarditis  - course of therapy will likely be 2-4 wks. Await culture results to determine length of therapy  Albert Castner B. Drue Second MD MPH [facility] [phone]

## 2013-06-30 DIAGNOSIS — R7881 Bacteremia: Secondary | ICD-10-CM

## 2013-06-30 DIAGNOSIS — D72829 Elevated white blood cell count, unspecified: Secondary | ICD-10-CM

## 2013-06-30 DIAGNOSIS — E7800 Pure hypercholesterolemia, unspecified: Secondary | ICD-10-CM

## 2013-06-30 DIAGNOSIS — I10 Essential (primary) hypertension: Secondary | ICD-10-CM

## 2013-06-30 DIAGNOSIS — A4901 Methicillin susceptible Staphylococcus aureus infection, unspecified site: Secondary | ICD-10-CM

## 2013-06-30 LAB — GLUCOSE, CAPILLARY
Glucose-Capillary: 220 mg/dL — ABNORMAL HIGH (ref 70–99)
Glucose-Capillary: 208 mg/dL — ABNORMAL HIGH (ref 70–99)
Glucose-Capillary: 236 mg/dL — ABNORMAL HIGH (ref 70–99)

## 2013-06-30 LAB — BASIC METABOLIC PANEL
BUN: 18 mg/dL (ref 6–23)
Chloride: 98 mEq/L (ref 96–112)
Creatinine, Ser: 0.73 mg/dL (ref 0.50–1.35)
GFR calc non Af Amer: >90 mL/min (ref >90)
Glucose, Bld: 232 mg/dL — ABNORMAL HIGH (ref 70–99)
Potassium: 4.2 mEq/L (ref 3.5–5.1)

## 2013-06-30 LAB — URINE CULTURE: Culture: NO GROWTH

## 2013-06-30 LAB — CULTURE, BLOOD (ROUTINE X 2)

## 2013-06-30 LAB — CBC
HCT: 28.1 % — ABNORMAL LOW (ref 39.0–52.0)
Hemoglobin: 9.5 g/dL — ABNORMAL LOW (ref 13.0–17.0)
MCHC: 33.8 g/dL (ref 30.0–36.0)
MCV: 79.8 fL (ref 78.0–100.0)
RDW: 13.5 % (ref 11.5–15.5)

## 2013-06-30 MED ORDER — LISINOPRIL 5 MG PO TABS
5.0000 mg | ORAL_TABLET | Freq: Every day | ORAL | Status: DC
  Administered 2013-06-30 – 2013-07-03 (×4): 5 mg via ORAL
  Filled 2013-06-30 (×4): qty 1

## 2013-06-30 MED ORDER — DOCUSATE SODIUM 100 MG PO CAPS
100.0000 mg | ORAL_CAPSULE | Freq: Two times a day (BID) | ORAL | Status: DC
  Administered 2013-06-30 – 2013-07-03 (×7): 100 mg via ORAL
  Filled 2013-06-30 (×10): qty 1

## 2013-06-30 MED ORDER — POLYETHYLENE GLYCOL 3350 17 G PO PACK
17.0000 g | PACK | Freq: Every day | ORAL | Status: DC | PRN
  Administered 2013-07-01: 21:00:00 17 g via ORAL
  Filled 2013-06-30: qty 1

## 2013-06-30 MED ORDER — BISACODYL 10 MG RE SUPP: 10.0000 mg | Freq: Every day | RECTAL | Status: DC | PRN

## 2013-06-30 MED ORDER — CEFAZOLIN SODIUM-DEXTROSE 2-3 GM-% IV SOLR
2.0000 g | Freq: Three times a day (TID) | INTRAVENOUS | Status: DC
  Administered 2013-06-30 – 2013-07-03 (×10): 2 g via INTRAVENOUS
  Filled 2013-06-30 (×12): qty 50

## 2013-06-30 MED ORDER — CEFAZOLIN SODIUM-DEXTROSE 2-3 GM-% IV SOLR: 2.0000 g | Freq: Three times a day (TID) | INTRAVENOUS | Status: DC

## 2013-06-30 NOTE — Progress Notes (Signed)
SUBJECTIVE: Pt denies chest pain and SOB, denying he ever experienced any chest pain. Has stump spasms.     Intake/Output Summary (Last 24 hours) at 06/30/13 0819 Last data filed at 06/30/13 1610  Gross per 24 hour  Intake 2558.9 ml  Output   2030 ml  Net  528.9 ml    Current Facility-Administered Medications  Medication Dose Route Frequency Provider Last Rate Last Dose  . 0.9 %  sodium chloride infusion   Intravenous Continuous Nadara Mustard, MD 10 mL/hr at 06/29/13 1728    . acetaminophen (TYLENOL) tablet 650 mg  650 mg Oral Q6H PRN Elease Etienne, MD       Or  . acetaminophen (TYLENOL) suppository 650 mg  650 mg Rectal Q6H PRN Elease Etienne, MD      . albuterol (PROVENTIL) (5 MG/ML) 0.5% nebulizer solution 2.5 mg  2.5 mg Nebulization Q2H PRN Elease Etienne, MD      . aspirin chewable tablet 81 mg  81 mg Oral Daily Kathleene Hazel, MD   81 mg at 06/29/13 1033  . bisacodyl (DULCOLAX) suppository 10 mg  10 mg Rectal Daily PRN Merwyn Katos, MD      . docusate sodium (COLACE) capsule 100 mg  100 mg Oral BID Merwyn Katos, MD      . fentaNYL (SUBLIMAZE) injection 50-100 mcg  50-100 mcg Intravenous Q2H PRN Lonia Farber, MD   100 mcg at 06/29/13 1507  . heparin ADULT infusion 100 units/mL (25000 units/250 mL)  3,300 Units/hr Intravenous Continuous Sallee Provencal, RPH 33 mL/hr at 06/30/13 0350 3,300 Units/hr at 06/30/13 0350  . HYDROmorphone (DILAUDID) injection 0.5-1 mg  0.5-1 mg Intravenous Q2H PRN Nadara Mustard, MD   1 mg at 06/30/13 0754  . insulin aspart (novoLOG) injection 0-15 Units  0-15 Units Subcutaneous TID WC Simonne Martinet, NP   5 Units at 06/29/13 1803  . insulin aspart (novoLOG) injection 0-5 Units  0-5 Units Subcutaneous QHS Simonne Martinet, NP      . insulin aspart (novoLOG) injection 6 Units  6 Units Subcutaneous TID WC Simonne Martinet, NP   6 Units at 06/29/13 1802  . insulin detemir (LEVEMIR) injection 20 Units  20 Units Subcutaneous  Daily Simonne Martinet, NP   20 Units at 06/28/13 1014  . metoCLOPramide (REGLAN) tablet 5-10 mg  5-10 mg Oral Q8H PRN Nadara Mustard, MD       Or  . metoCLOPramide (REGLAN) injection 5-10 mg  5-10 mg Intravenous Q8H PRN Nadara Mustard, MD      . metoprolol (LOPRESSOR) tablet 50 mg  50 mg Oral BID Coralyn Helling, MD   50 mg at 06/29/13 2159  . ondansetron (ZOFRAN) tablet 4 mg  4 mg Oral Q6H PRN Elease Etienne, MD       Or  . ondansetron [facility]) injection 4 mg  4 mg Intravenous Q6H PRN Elease Etienne, MD   4 mg at 06/27/13 2004  . oxyCODONE (Oxy IR/ROXICODONE) immediate release tablet 5 mg  5 mg Oral Q4H PRN Lonia Farber, MD   5 mg at 06/30/13 0402  . pantoprazole (PROTONIX) EC tablet 40 mg  40 mg Oral Daily Coralyn Helling, MD   40 mg at 06/28/13 1014  . polyethylene glycol (MIRALAX / GLYCOLAX) packet 17 g  17 g Oral Daily PRN Merwyn Katos, MD      . pregabalin (LYRICA) capsule 75 mg  75  mg Oral BID Elease Etienne, MD   75 mg at 06/29/13 2159  . simvastatin (ZOCOR) tablet 40 mg  40 mg Oral QPM Elease Etienne, MD   40 mg at 06/29/13 1723  . vancomycin (VANCOCIN) 1,750 mg in sodium chloride 0.9 % 500 mL IVPB  1,750 mg Intravenous Q12H Sallee Provencal, RPH   1,750 mg at 06/29/13 2057  . zolpidem (AMBIEN) tablet 5 mg  5 mg Oral QHS PRN Elease Etienne, MD        Filed Vitals:   06/29/13 2108 06/29/13 2315 06/30/13 0631 06/30/13 0700  BP: 150/92  137/83   Pulse: 120 122 102   Temp: 98.1 F (36.7 C)  97.9 F (36.6 C)   TempSrc: Oral  Oral   Resp: 20  18   Height:      Weight:    262 lb 12.6 oz (119.2 kg)  SpO2: 96%  100%     PHYSICAL EXAM General: NAD Neck: No JVD, no thyromegaly or thyroid nodule.  Lungs: Clear to auscultation bilaterally with normal respiratory effort. CV: Nondisplaced PMI.  Heart regular rhythm, borderline tachycardic, normal S1/S2, no S3/S4, no murmur.   Abdomen: Soft, nontender, no hepatosplenomegaly, no distention.  Neurologic: Alert and oriented  x 3.  Psych: Normal affect. Extremities: left leg and right stump are wrapped  TELEMETRY: Reviewed telemetry pt in NSR/sinus tachycardia  LABS: Basic Metabolic Panel:  Recent Labs  16/10/96 0425 06/30/13 0148  NA 132* 129*  K 3.7 4.2  CL 100 98  CO2 22 20  GLUCOSE 167* 232*  BUN 20 18  CREATININE 0.91 0.73  CALCIUM 8.3* 7.9*   Liver Function Tests: No results found for this basename: AST, ALT, ALKPHOS, BILITOT, PROT, ALBUMIN,  in the last 72 hours No results found for this basename: LIPASE, AMYLASE,  in the last 72 hours CBC:  Recent Labs  06/29/13 0425 06/30/13 0148  WBC 14.7* 26.0*  HGB 8.9* 9.5*  HCT 26.6* 28.1*  MCV 79.2 79.8  PLT 297 463*   Cardiac Enzymes: No results found for this basename: CKTOTAL, CKMB, CKMBINDEX, TROPONINI,  in the last 72 hours BNP: No components found with this basename: POCBNP,  D-Dimer: No results found for this basename: DDIMER,  in the last 72 hours Hemoglobin A1C: No results found for this basename: HGBA1C,  in the last 72 hours Fasting Lipid Panel: No results found for this basename: CHOL, HDL, LDLCALC, TRIG, CHOLHDL, LDLDIRECT,  in the last 72 hours Thyroid Function Tests: No results found for this basename: TSH, T4TOTAL, FREET3, T3FREE, THYROIDAB,  in the last 72 hours Anemia Panel: No results found for this basename: VITAMINB12, FOLATE, FERRITIN, TIBC, IRON, RETICCTPCT,  in the last 72 hours  RADIOLOGY: Dg Chest Port 1 View  06/27/2013   *RADIOLOGY REPORT*  Clinical Data: Shortness of breath, evaluate endotracheal tube position  PORTABLE CHEST - 1 VIEW  Comparison: Chest x-ray of 01/15/2013  Findings: No endotracheal tube is visible.  The lungs are not as well aerated and may be mild pulmonary vascular congestion present with some basilar volume loss.  The heart is within upper limits of normal.  No bony abnormality is seen.  IMPRESSION: Diminished aeration.  Question mild pulmonary vascular congestion.   Original Report  Authenticated By: Dwyane Dee, M.D.   Dg Foot Complete Right  06/25/2013   *RADIOLOGY REPORT*  Clinical Data: Foot abscess; diabetes mellitus  RIGHT FOOT COMPLETE - 3+ VIEW  Comparison: None.  Findings: Frontal, oblique,  lateral views were obtained.  There are Charcot changes throughout the midfoot.  There is fragmentation in the tarsal - metatarsal region with a divergent Lisfranc type fracture in the mid foot between the first and second metatarsals.  There is extensive soft tissue air throughout the volar aspect of the foot consistent with widespread abscess.  There is no well- defined osteomyelitis on this study.  There is narrowing of all PIP and DIP joints.  There is pes planus.  IMPRESSION: Extensive soft tissue air consistent with abscess tracking along the volar aspect of the foot somewhat diffusely but concentrated primarily at the level of the metatarsals.  No overt osteomyelitis.  Charcot changes with fragmentation of the mid foot region. Lisfranc type separation between the first and second metatarsals.  Pes planus.  Multilevel osteoarthritic change in the PIP and DIP joints.   Original Report Authenticated By: Bretta Bang, M.D.      ASSESSMENT AND PLAN: 1. Severe 3-vessel CAD s/p NSTEMI: s/p right BKA. Had cardiac cath on 9/12, and found to have severe 3-vessel disease, but CABG not favorable option given sepsis due to necrotic right foot at the time. Currently on ASA, simvastatin, metoprolol, and heparin. Troponin 15.24 on 9/10, and echocardiogram revealed low normal/mildly reduced LV systolic function (no vegetations mentioned). Continue current medical therapy. CT surgery to evaluate this upcoming week. Currently free of symptoms. 2. HTN: relatively well controlled, with spikes likely secondary to pain. Will start low-dose ACEI with continued monitoring of renal function.   Prentice Docker, M.D., F.A.C.C.

## 2013-06-30 NOTE — Progress Notes (Signed)
[facility]    Date of Admission:  06/25/2013   Total days of antibiotics 6        Day 6 vanco        Day 1 cefazolin        ( 5 days of piptazo/clinda)   ID: Albert [name] is a 52 y.o. male with poorly controlled DM, presents with SIRS and MSSA bacteremia from gangrenous right foot s/p debridement c/b perioperative NSTEMI found to have severe 3-vessel CAD and POD#1 s/p trans-tibial amputation of right leg on 9/13   Principal Problem:   Foot abscess, right Active Problems:   Hypercholesteremia   Diabetes mellitus out of control   Essential hypertension, benign   Atherosclerotic peripheral vascular disease   AKI (acute kidney injury)   Sepsis   Dehydration with hyponatremia   Leukocytosis, unspecified   Sepsis(995.91)   SEMI (subendocardial myocardial infarction)    Subjective: Afebrile, but still having significant right leg pain and spasm  Medications:  . aspirin  81 mg Oral Daily  .  ceFAZolin (ANCEF) IV  2 g Intravenous Q8H  . docusate sodium  100 mg Oral BID  . insulin aspart  0-15 Units Subcutaneous TID WC  . insulin aspart  0-5 Units Subcutaneous QHS  . insulin aspart  6 Units Subcutaneous TID WC  . insulin detemir  20 Units Subcutaneous Daily  . lisinopril  5 mg Oral Daily  . metoprolol tartrate  50 mg Oral BID  . pantoprazole  40 mg Oral Daily  . pregabalin  75 mg Oral BID  . simvastatin  40 mg Oral QPM    Objective: Vital signs in last 24 hours: Temp:  [97.7 F (36.5 C)-98.1 F (36.7 C)] 97.9 F (36.6 C) (09/14 0631) Pulse Rate:  [102-122] 102 (09/14 0631) Resp:  [15-20] 18 (09/14 0631) BP: (133-150)/(83-99) 137/83 mmHg (09/14 0631) SpO2:  [94 %-100 %] 100 % (09/14 0631) Arterial Line BP: (168-186)/(87-92) 186/88 mmHg (09/13 1400) Weight:  [262 lb 12.6 oz (119.2 kg)] 262 lb 12.6 oz (119.2 kg) (09/14 0700) Constitutional: He is oriented to person, place, and time. He appears well-developed and well-nourished. No distress.   HENT:  Mouth/Throat: Oropharynx is clear and moist. No oropharyngeal exudate.  Cardiovascular: Normal rate, regular rhythm and normal heart sounds. Exam reveals no gallop and no friction rub.  No murmur heard.  Pulmonary/Chest: Effort normal and breath sounds normal. No respiratory distress. He has no wheezes.  Abdominal: Soft. Bowel sounds are normal. He exhibits no distension. There is no tenderness.  Lymphadenopathy:  He has no cervical adenopathy.  Neurological: He is alert and oriented to person, place, and time.  Skin: Skin is warm and dry. No rash noted. No erythema. 3 small reddish macules on tip of nose, no vesicles Psychiatric: He has a normal mood and affect. His behavior is normal.  Ext: right lower extremity is wrapped from recent BKA, no blood coming through bandages. Left foot bandaged, only great toe remains, other digits amputated.  Lab Results  Recent Labs  06/29/13 0425 06/30/13 0148  WBC 14.7* 26.0*  HGB 8.9* 9.5*  HCT 26.6* 28.1*  NA 132* 129*  K 3.7 4.2  CL 100 98  CO2 22 20  BUN 20 18  CREATININE 0.91 0.73    Microbiology: 9/10 blood cx : staph aureas  9/12 urine cx: NGTD 9/13 blood cx: pending  Studies/Results: 9/10 TTE: Study Conclusions  - Left ventricle: Poor endocardial definition Distal septal  hypokinesis EF low normal or mildly decreased The cavity size was normal. Wall thickness was normal. - Atrial septum: No defect or patent foramen ovale was identified. - Pulmonary arteries: PA peak pressure: 42mm Hg (S). - Impressions: No obvious vegetations Impressions: - No obvious vegetations   Assessment/Plan:  52 y.o. male with poorly controlled DM, hospitalized for gangrenous right foot and found to have MSSA bacteremia s/p debridement on 9/10 c/b perioperative NSTEMI found to have severe 3-vessel CAD and POD#1 s/p trans-tibial amputation of right leg on 9/13  MSSA bacteremia = continue on cefazolin 2gm IV Q 8hr. Repeat blood cultures from  9/13 are pending to determine if still has ongoing bacteremia. Source of infection is thought to be related to his gangrenous right foot infection that has now been amputated. Patient's hospitalization complicated by periop NSTEMI finding 3V CAD which is being evaluated for surgical intervention. TTE does not suggest to have an vegetations although lower sensitivity than TEE. If he is to have TEE for cardiac work-up, would also evaluate for vegetations. If TEE negative adn surveillance blood cultures are negative, then can do 2 wk of cefazolin  Starting from today.  Leukocytosis = likely related to recent surgery. Continue to monitor.  [name] to provide further [employer] Infectious Diseases Cell: [phone] Pager: [phone]  06/30/2013, 1:03 PM

## 2013-06-30 NOTE — Progress Notes (Signed)
Spoke with IV team RN about waiting until tomorrow 07/01/13 to insert PICC line and explained that pt's current PIV is dated for 06/25/13 and is outdated. IV team RN stated it would be unnecessary to change it at this time since pt will be getting a PICC line tomorrow. Current PIV infusing without problems. Will monitor.  Kathlene November, Deaunte Dente [place]

## 2013-06-30 NOTE — Progress Notes (Signed)
[facility] PROGRESS NOTE  Albert [name]:[id number] DOB: 1961/08/01 DOA: 06/25/2013 PCP: [name], MD  Assessment/Plan: 1. R BKA - Recommendations per ortho - social worker consult for SNF placement  2. NSTEMI post op - Cardiology on board and managing will defer recommendations to them  3. MSSA bacteremia - ID on board and managing antibiotics.  Will defer further recommendations to them and once patient ready to go to SNF will place on the antibiotic regimen that they will outline moving forward. - Source suspected to be 2ary to right gangrenous foot  4. DM  - continue diabetic diet - patient is currently on SSI - Blood sugars elevated and most likely due to ongoing infection, once infection is ameliorated suspect blood sugars will be easier to control  5. HTN - Relatively well controlled on lisinopril with last creatinine at 0.73 - Will continue to monitor.  6. HPL -stable  - continue statin  Code Status: full Family Communication: no family at beside.  Disposition Plan: Pending continued improvement in condition and once cleared by specialist involved.   Consultants:  ID  Ortho  Cardiology  Procedures: 9/9 R foot XR >>> Extensive soft tissue air consistent with abscess tracking along the volar aspect of the foot somewhat diffusely but concentrated primarily at the level of the metatarsals.  9/9 OR >>> I&D of R foot abscess  9/10- trop pos, tachy  9/10 Echo >>> Left ventricle showed distal septal hypokinesis, EF low normal or mildly decreased  9/12 cath >>3V CAD, EF 40%, CABG vs med mx  9/13 Rt BKA [place])  Antibiotics:  Ancef  HPI/Subjective: No new complaints. No acute issues reported overnight to me.  Objective: Filed Vitals:   06/30/13 1334  BP: 133/90  Pulse: 103  Temp: 98.3 F (36.8 C)  Resp: 18    Intake/Output Summary (Last 24 hours) at 06/30/13 1756 Last data filed at 06/30/13 0900  Gross per 24 hour  Intake 1428.9 ml   Output   1000 ml  Net  428.9 ml   Filed Weights   06/28/13 0500 06/28/13 1620 06/30/13 0700  Weight: 121.2 kg (267 lb 3.2 oz) 121.8 kg (268 lb 8.3 oz) 119.2 kg (262 lb 12.6 oz)    Exam:   General:  Pt in NAD, Alert and Awake  Cardiovascular: RRR, no MRG  Respiratory: CTA BL, no wheezes  Abdomen: soft, NT, ND  Musculoskeletal: no cyanosis or clubbing  Data Reviewed: Basic Metabolic Panel:  Recent Labs Lab 06/25/13 1811 06/26/13 0100  06/27/13 0845 06/27/13 1140 06/28/13 0330 06/29/13 0425 06/30/13 0148  NA 131* 134*  < > 126* 130* 131* 132* 129*  K 3.8 3.7  < > 3.6 3.9 3.8 3.7 4.2  CL 97 104  < > 94* 100 100 100 98  CO2 21 22  < > 22 22 21 22 20   GLUCOSE 307* 167*  < > 146* 172* 147* 167* 232*  BUN 32* 25*  < > 23 23 22 20 18   CREATININE 1.64* 1.42*  < > 1.09 1.02 0.94 0.91 0.73  CALCIUM 9.9 8.5  < > 8.4 8.1* 8.2* 8.3* 7.9*  MG  --  1.7  --   --   --   --   --   --   < > = values in this interval not displayed. Liver Function Tests:  Recent Labs Lab 06/27/13 0200  AST 64*  ALT 33  ALKPHOS 64  BILITOT 0.5  PROT 5.7*  ALBUMIN 2.0*  No results found for this basename: LIPASE, AMYLASE,  in the last 168 hours No results found for this basename: AMMONIA,  in the last 168 hours CBC:  Recent Labs Lab 06/25/13 1135 06/26/13 0525 06/27/13 0200 06/28/13 0330 06/29/13 0425 06/30/13 0148  WBC 17.1* 12.0* 10.3 11.4* 14.7* 26.0*  NEUTROABS 15.5*  --   --   --   --   --   HGB 12.5* 10.8* 10.5* 9.5* 8.9* 9.5*  HCT 36.9* 32.9* 31.9* 28.4* 26.6* 28.1*  MCV 80.0 80.4 79.4 78.9 79.2 79.8  PLT 243 207 201 242 297 463*   Cardiac Enzymes:  Recent Labs Lab 06/26/13 0100 06/26/13 0525 06/26/13 1135  TROPONINI 12.42* 10.97* 15.24*   BNP (last 3 results) No results found for this basename: PROBNP,  in the last 8760 hours CBG:  Recent Labs Lab 06/29/13 1109 06/29/13 1724 06/29/13 2106 06/30/13 0744 06/30/13 1136  GLUCAP 197* 229* 186* 217* 220*     Recent Results (from the past 240 hour(s))  CULTURE, BLOOD (ROUTINE X 2)     Status: None   Collection Time    06/26/13  1:00 AM      Result Value Range Status   Specimen Description BLOOD RIGHT ARM   Final   Special Requests     Final   Value: BOTTLES DRAWN AEROBIC AND ANAEROBIC  ,   Culture  Setup Time     Final   Value: 06/26/2013 04:11     Performed at [facility]   Culture     Final   Value: STAPHYLOCOCCUS AUREUS     Note: SUSCEPTIBILITIES PERFORMED ON PREVIOUS CULTURE WITHIN THE LAST 5 DAYS.     Note: Gram Stain Report Called to,Read Back By and Verified With: STACY PHELPS 06/28/13 1155 BY SMITHERSJ     Performed at [facility]   Report Status 06/30/2013 FINAL   Final  CULTURE, BLOOD (ROUTINE X 2)     Status: None   Collection Time    06/26/13  1:08 AM      Result Value Range Status   Specimen Description BLOOD RIGHT ARM   Final   Special Requests BOTTLES DRAWN AEROBIC AND ANAEROBIC    Final   Culture  Setup Time     Final   Value: 06/26/2013 04:10     Performed at [facility]   Culture     Final   Value: STAPHYLOCOCCUS AUREUS     Note: RIFAMPIN AND GENTAMICIN SHOULD NOT BE USED AS SINGLE DRUGS FOR TREATMENT OF STAPH INFECTIONS.     Note: Gram Stain Report Called to,Read Back By and Verified With: JOHN HUNT ON 06/28/2013 AT 8:21P BY Serafina Mitchell     Performed at [facility]   Report Status 06/30/2013 FINAL   Final   Organism ID, Bacteria STAPHYLOCOCCUS AUREUS   Final  MRSA PCR SCREENING     Status: None   Collection Time    06/26/13  8:08 AM      Result Value Range Status   MRSA by PCR NEGATIVE  NEGATIVE Final   Comment:            The GeneXpert MRSA Assay (FDA     approved for NASAL specimens     only), is one component of a     comprehensive MRSA colonization     surveillance program. It is not     intended to diagnose MRSA     infection nor to guide  or     monitor treatment for     MRSA infections.  URINE  CULTURE     Status: None   Collection Time    06/28/13  6:05 PM      Result Value Range Status   Specimen Description URINE, CATHETERIZED   Final   Special Requests Normal   Final   Culture  Setup Time     Final   Value: 06/28/2013 18:30     Performed at [facility] Count     Final   Value: NO GROWTH     Performed at [facility]   Culture     Final   Value: NO GROWTH     Performed at [facility]   Report Status 06/30/2013 FINAL   Final  CULTURE, BLOOD (ROUTINE X 2)     Status: None   Collection Time    06/29/13  3:10 PM      Result Value Range Status   Specimen Description BLOOD RIGHT ANTECUBITAL   Final   Special Requests BOTTLES DRAWN AEROBIC AND ANAEROBIC 10CC   Final   Culture  Setup Time     Final   Value: 06/29/2013 21:30     Performed at [facility]   Culture     Final   Value:        BLOOD CULTURE RECEIVED NO GROWTH TO DATE CULTURE WILL BE HELD FOR 5 DAYS BEFORE ISSUING A FINAL NEGATIVE REPORT     Performed at [facility]   Report Status PENDING   Incomplete  CULTURE, BLOOD (ROUTINE X 2)     Status: None   Collection Time    06/29/13  3:20 PM      Result Value Range Status   Specimen Description BLOOD RIGHT HAND   Final   Special Requests BOTTLES DRAWN AEROBIC AND ANAEROBIC 10CC   Final   Culture  Setup Time     Final   Value: 06/29/2013 21:31     Performed at [facility]   Culture     Final   Value:        BLOOD CULTURE RECEIVED NO GROWTH TO DATE CULTURE WILL BE HELD FOR 5 DAYS BEFORE ISSUING A FINAL NEGATIVE REPORT     Performed at [facility]   Report Status PENDING   Incomplete     Studies: No results found.  Scheduled Meds: . aspirin  81 mg Oral Daily  .  ceFAZolin (ANCEF) IV  2 g Intravenous Q8H  . docusate sodium  100 mg Oral BID  . insulin aspart  0-15 Units Subcutaneous TID WC  . insulin aspart  0-5 Units Subcutaneous QHS  . insulin aspart  6 Units Subcutaneous TID WC  .  insulin detemir  20 Units Subcutaneous Daily  . lisinopril  5 mg Oral Daily  . metoprolol tartrate  50 mg Oral BID  . pantoprazole  40 mg Oral Daily  . pregabalin  75 mg Oral BID  . simvastatin  40 mg Oral QPM   Continuous Infusions: . sodium chloride 10 mL/hr at 06/29/13 1728  . heparin 3,300 Units/hr (06/30/13 1158)    Principal Problem:   Foot abscess, right Active Problems:   Hypercholesteremia   Diabetes mellitus out of control   Essential hypertension, benign   Atherosclerotic peripheral vascular disease   AKI (acute kidney injury)   Sepsis   Dehydration with hyponatremia   Leukocytosis, unspecified  Sepsis(995.91)   SEMI (subendocardial myocardial infarction)    Time spent: > 35 minutes    Penny Pia  [facility] Pager [phone]. If 7PM-7AM, please contact night-coverage at [email or web address], password [facility] 06/30/2013, 5:56 PM  LOS: 5 days

## 2013-06-30 NOTE — Progress Notes (Signed)
Patient ID: Albert [name], male   DOB: [birth year], 52 y.o.   MRN: [id number] Right transtibial amputation dressing clean dry and intact. Patient given instructions to work on knee extension. Patient will need skilled nursing placement. He is safe for discharge to skilled nursing at this time. With his disability to his upper and lower extremities. Patient most likely will will be totally disabled.

## 2013-06-30 NOTE — Evaluation (Signed)
Physical Therapy Evaluation Patient Details Name: Albert [name] MRN: [id number] DOB: 05-Sep-1961 Today's Date: 06/30/2013 Time: 7829-5621 PT Time Calculation (min): 35 min  PT Assessment / Plan / Recommendation History of Present Illness    Albert [name] is a 52 y.o. male with poorly controlled DM, presents with SIRS and MSSA bacteremia from gangrenous right foot s/p debridement c/b perioperative NSTEMI found to have severe 3-vessel CAD and POD#1 s/p trans-tibial amputation of right leg on 9/13   Clinical Impression  Pt presents with well controlled pain in residual limb, educated re: phantom pain and strategies to manage.  Able to perform lateral scoot to chair with min assist and will be an excellent inpt rehab candidate to progress to wheelchair level activities... Unclear if/whether pt would be good prosthetic candidate, however educated on postamp exercise to promote potential.  Recommend OOB with nursing daily, position on side or in prone intermittently if able to tolerate to encourage hip extension and to continue acute PT until post-acute rehab plan is determined.      PT Assessment  Patient needs continued PT services    Follow Up Recommendations  CIR    Does the patient have the potential to tolerate intense rehabilitation      Barriers to Discharge Inaccessible home environment;Decreased caregiver support      Equipment Recommendations  Wheelchair (measurements PT);Wheelchair cushion (measurements PT);3in1 (PT);Rolling walker with 5" wheels    Recommendations for Other Services Rehab consult   Frequency Min 4X/week    Precautions / Restrictions Precautions Precautions: Fall Restrictions Other Position/Activity Restrictions: elevate operated limb, do not prop pillows under knee; try to position on LEFT side so that RIGHT hip can be in neutral/extension   Pertinent Vitals/Pain No to minimal pain right leg      Mobility  Bed Mobility Bed Mobility: Supine to  Sit Supine to Sit: 5: Supervision;HOB elevated;With rails Details for Bed Mobility Assistance: standby assist for safety Transfers Transfers: Lateral/Scoot Transfers Lateral/Scoot Transfers: 4: Min assist;From elevated surface Details for Transfer Assistance: scooted with supervision cues for technique right<>left at EOB, then positioned drop-arm recliner to right side (only side that armrest drops on) and min assist to direct technique and slide pad to scoot from bed to chair.  Educated pt to coach nursing (and educated nursing) for back to bed Ambulation/Gait Ambulation/Gait Assistance: Not tested (comment) Stairs: No Corporate treasurer: No    Exercises Amputee Exercises Quad Sets: AROM;Right;10 reps;Seated Gluteal Sets: AROM;Right;5 reps;Seated Knee Extension: AROM;Right;10 reps;Seated   PT Diagnosis: Difficulty walking  PT Problem List: Pain;Obesity;Decreased knowledge of use of DME;Decreased mobility;Decreased strength PT Treatment Interventions: Wheelchair mobility training;Patient/family education;Therapeutic exercise;Therapeutic activities;Functional mobility training     PT Goals(Current goals can be found in the care plan section) Acute Rehab PT Goals Patient Stated Goal: get back home PT Goal Formulation: With patient Time For Goal Achievement: 07/14/13 Potential to Achieve Goals: Good  Visit Information  Last PT Received On: 06/30/13 Assistance Needed: +1       Prior Functioning  Home Living Family/patient expects to be discharged to:: Private residence Living Arrangements: Spouse/significant other Available Help at Discharge: Available PRN/intermittently Type of Home: House Home Access: Level entry Home Layout: Two level Alternate Level Stairs-Number of Steps: 1 step between living areas Alternate Level Stairs-Rails: None Home Equipment: None Prior Function Level of Independence: Independent Communication Communication: No  difficulties Dominant Hand: Right (but right residual weakness so functionally left handed)    Cognition  Cognition Arousal/Alertness: Awake/alert Behavior During  Therapy: WFL for tasks assessed/performed Overall Cognitive Status: Within Functional Limits for tasks assessed    Extremity/Trunk Assessment Upper Extremity Assessment Upper Extremity Assessment: RUE deficits/detail RUE Deficits / Details: residual weakness from previous stroke Lower Extremity Assessment Lower Extremity Assessment: RLE deficits/detail;LLE deficits/detail RLE Deficits / Details: s/p BKA with intact knee extension to -5 in sitting AROM LLE Deficits / Details: s/p 4 toe amputations (all but Great toe), otherwise intact strength/ROM   Balance Balance Balance Assessed: Yes Dynamic Sitting Balance Dynamic Sitting - Balance Activities: Lateral lean/weight shifting;Head control activities (excellent balance with left foot supported to shift and scoo)  End of Session PT - End of Session Activity Tolerance: Patient tolerated treatment well Patient left: in chair;with call bell/phone within reach;with nursing/sitter in room Nurse Communication: Mobility status;Weight bearing status (transfer technique)  GP     Albert [name] 06/30/2013, 3:00 PM

## 2013-06-30 NOTE — Progress Notes (Addendum)
ANTICOAGULATION/ANTIBIOTIC CONSULT NOTE - Follow Up Consult  Pharmacy Consult for heparin/ cefazolin Indication: NSTEMI/MSSA bacteremia  Allergies  Allergen Reactions  . Amoxicillin     vomiting    Patient Measurements: Height: 5\' 11"  (180.3 cm) Weight: 262 lb 12.6 oz (119.2 kg) IBW/kg (Calculated) : 75.3 Heparin Dosing Weight: 102kg  Vital Signs: Temp: 97.9 F (36.6 C) (09/14 0631) Temp src: Oral (09/14 0631) BP: 137/83 mmHg (09/14 0631) Pulse Rate: 102 (09/14 0631)  Labs:  Recent Labs  06/28/13 0330 06/28/13 0815 06/29/13 0425 06/29/13 0601 06/29/13 1930 06/30/13 0148  HGB 9.5*  --  8.9*  --   --  9.5*  HCT 28.4*  --  26.6*  --   --  28.1*  PLT 242  --  297  --   --  463*  LABPROT  --  15.1  --   --   --   --   INR  --  1.22  --   --   --   --   HEPARINUNFRC 0.32  --   --  0.13* 0.65 0.55  CREATININE 0.94  --  0.91  --   --  0.73    Estimated Creatinine Clearance: 145.2 ml/min (by C-G formula based on Cr of 0.73).   Medications:  Heparin @ 3300 units/hr Cefazolin 2g IV q8h  Assessment: NSTEMI, 3v CAD - continues on anticoagulation with heparin. Heparin level remains therapeutic. This AM HL was 0.55 (his 2nd therapeutic level).  No bleeding noted.   MSSA bacteremia - vanc not given this AM d/t loss of IV access per discussion with RN (though did not find mention of event in notes) and heparin is running through other line which is incompatible with vancomycin. Cefazolin had been given x1. Blood growing MSSA. Per Dr. Drue Second, we are discontinuing the vancomycin, and continuing cefazolin therapy.  Per Micromedex, ancef and heparin are compatible so decreased IV access should not be a limitation. RN made aware of "vomiting" reaction with amoxicillin and will monitor for any reaction with ancef.  Goal of Therapy:  Heparin level 0.3-0.7 units/ml Monitor platelets by anticoagulation protocol: Yes Resolution of infection   Plan:  1. Continue heparin at 3300  units/hr 2. Daily HL and CBC; Follow for any signs of bleeding 3. D/c vancomycin; continue ancef 2g IV q 8h 4. F/u cultures, clinical progression, renal function  Garry Bochicchio C. Dorothea Yow, PharmD Clinical Pharmacist-Resident Pager: [phone] Pharmacy: [phone] 06/30/2013 8:41 AM

## 2013-06-30 NOTE — Progress Notes (Signed)
Rehab Admissions Coordinator Note:  Patient was screened by Clois Dupes for appropriateness for an Inpatient Acute Rehab Consult.  At this time, we are recommending Inpatient Rehab consult.  Clois Dupes 06/30/2013, 4:15 PM  I can be reached at [phone].

## 2013-06-30 NOTE — Progress Notes (Signed)
Spoke to Dr. Drue Second in patient's room who stated she would prefer the PICC line not be inserted until tomorrow 07/01/13 due to pending [facility] results. Will monitor.  Kathlene November, [place] Ca[place]

## 2013-07-01 DIAGNOSIS — I2510 Atherosclerotic heart disease of native coronary artery without angina pectoris: Secondary | ICD-10-CM | POA: Diagnosis present

## 2013-07-01 DIAGNOSIS — S88119A Complete traumatic amputation at level between knee and ankle, unspecified lower leg, initial encounter: Secondary | ICD-10-CM

## 2013-07-01 DIAGNOSIS — A5211 Tabes dorsalis: Secondary | ICD-10-CM

## 2013-07-01 DIAGNOSIS — I6529 Occlusion and stenosis of unspecified carotid artery: Secondary | ICD-10-CM | POA: Diagnosis present

## 2013-07-01 LAB — BASIC METABOLIC PANEL
Chloride: 97 mEq/L (ref 96–112)
GFR calc Af Amer: >90 mL/min (ref >90)
GFR calc non Af Amer: >90 mL/min (ref >90)
Potassium: 3.6 mEq/L (ref 3.5–5.1)
Sodium: 129 mEq/L — ABNORMAL LOW (ref 135–145)

## 2013-07-01 LAB — GLUCOSE, CAPILLARY
Glucose-Capillary: 267 mg/dL — ABNORMAL HIGH (ref 70–99)
Glucose-Capillary: 185 mg/dL — ABNORMAL HIGH (ref 70–99)

## 2013-07-01 LAB — CBC
Platelets: 516 10*3/uL — ABNORMAL HIGH (ref 150–400)
RBC: 3.19 MIL/uL — ABNORMAL LOW (ref 4.22–5.81)
RDW: 13.1 % (ref 11.5–15.5)
WBC: 22.3 10*3/uL — ABNORMAL HIGH (ref 4.0–10.5)

## 2013-07-01 MED ORDER — INSULIN DETEMIR 100 UNIT/ML ~~LOC~~ SOLN
23.0000 [IU] | Freq: Every day | SUBCUTANEOUS | Status: DC
  Administered 2013-07-02 – 2013-07-03 (×2): 23 [IU] via SUBCUTANEOUS
  Filled 2013-07-01 (×2): qty 0.23

## 2013-07-01 NOTE — Progress Notes (Signed)
Physical Therapy Treatment Patient Details Name: Albert [name] MRN: [id number] DOB: 03-May-1961 Today's Date: 07/01/2013 Time: 0865-7846 PT Time Calculation (min): 25 min  PT Assessment / Plan / Recommendation  History of Present Illness     PT Comments   Patient motivated and wanting to progress with mobility. Required more A to transfer to the recliner this session. Able to increase therex. Complaining of increased phantom pain throughout session.   Follow Up Recommendations  CIR     Does the patient have the potential to tolerate intense rehabilitation     Barriers to Discharge        Equipment Recommendations  Wheelchair (measurements PT);Wheelchair cushion (measurements PT);3in1 (PT);Rolling walker with 5" wheels    Recommendations for Other Services Rehab consult  Frequency Min 4X/week   Progress towards PT Goals Progress towards PT goals: Progressing toward goals  Plan Current plan remains appropriate    Precautions / Restrictions Precautions Precautions: Fall Restrictions Other Position/Activity Restrictions: elevate operated limb, do not prop pillows under knee; try to position on LEFT side so that RIGHT hip can be in neutral/extension   Pertinent Vitals/Pain Complaints of phantom pain throughout session. patient repositioned for comfort     Mobility  Bed Mobility Supine to Sit: 4: Min assist;With rails Details for Bed Mobility Assistance: A for trunk support. Cues for technique and positioning.  Transfers Lateral/Scoot Transfers: 3: Mod assist Details for Transfer Assistance: Patient able to transfer to R side with A for positioning with use of chuck pad. Patient educated on how to get back to bed.  Ambulation/Gait Ambulation/Gait Assistance: Not tested (comment)    Exercises Amputee Exercises Quad Sets: AROM;Right;10 reps;Seated Gluteal Sets: AROM;Right;Seated;10 reps Hip ABduction/ADduction: AROM;Right;10 reps Knee Flexion: AROM;Right;10 reps Straight  Leg Raises: AROM;Right;10 reps   PT Diagnosis:    PT Problem List:   PT Treatment Interventions:     PT Goals (current goals can now be found in the care plan section)    Visit Information  Last PT Received On: 07/01/13 Assistance Needed: +1    Subjective Data      Cognition  Cognition Arousal/Alertness: Awake/alert Behavior During Therapy: WFL for tasks assessed/performed Overall Cognitive Status: Within Functional Limits for tasks assessed    Balance     End of Session PT - End of Session Activity Tolerance: Patient tolerated treatment well Patient left: in chair;with call bell/phone within reach;with nursing/sitter in room Nurse Communication: Mobility status;Weight bearing status   GP     Fredrich Birks 07/01/2013, 2:15 PM  07/01/2013 Fredrich Birks PTA [phone] pager [phone] office

## 2013-07-01 NOTE — Progress Notes (Signed)
   CARE MANAGEMENT NOTE 07/01/2013  Patient:  JIRO, KIESTER   Account Number:  [id number]  Date Initiated:  06/28/2013  Documentation initiated by:  DAVIS,RHONDA  Subjective/Objective Assessment:   patient on day 3 s/p extensive debridement of wounds and decubitus to feet, to have cardiac cath this p,m [id number] in preparation for a bka     Action/Plan:   tbd most likely snf   Anticipated DC Date:  07/01/2013   Anticipated DC Plan:  SKILLED NURSING FACILITY  In-house referral  Clinical Social Worker      DC Planning Services  NA      [facility] Choice  NA   Choice offered to / List presented to:  NA   DME arranged  NA      DME agency  NA     HH arranged  NA      HH agency  NA   Status of service:  In process, will continue to follow Medicare Important Message given?  NA - LOS <3 / Initial given by admissions (If response is "NO", the following Medicare IM given date fields will be blank) Date Medicare IM given:   Date Additional Medicare IM given:    Discharge Disposition:    Per UR Regulation:  Reviewed for med. necessity/level of care/duration of stay  If discussed at Long Length of Stay Meetings, dates discussed:    Comments:  07/01/13 11:41 Letha Cape RN,BSN [phone] per physical therapy recs CIR vs snf,  per Britta Mccreedy  with CIR they will need a CIR consult,  I called Dr. Sung Amabile and left message that we need this order in for Rehab Consult.    [id number] Earlene Plater, RN, BSN, [place] [phone] Chart Reviewed for discharge and hospital needs. Discharge needs at time of review:  None Review of patient progress due on [id number].

## 2013-07-01 NOTE — Progress Notes (Addendum)
    SUBJECTIVE: no chest pain, no SOB. Only c/o right stump/leg pain.   BP 131/84  Pulse 96  Temp(Src) 97.8 F (36.6 C) (Oral)  Resp 18  Ht 5\' 11"  (1.803 m)  Wt 264 lb 5.3 oz (119.9 kg)  BMI 36.88 kg/m2  SpO2 97%  Intake/Output Summary (Last 24 hours) at 07/01/13 0747 Last data filed at 07/01/13 0600  Gross per 24 hour  Intake 1113.62 ml  Output   2070 ml  Net -956.38 ml    PHYSICAL EXAM General: Well developed, well nourished, in no acute distress. Alert and oriented x 3.  Psych:  Good affect, responds appropriately Neck: No JVD. No masses noted.  Lungs: Clear bilaterally with no wheezes or rhonci noted.  Heart: RRR with no murmurs noted. Abdomen: Bowel sounds are present. Soft, non-tender.  Extremities: right BKA, bandage left foot  LABS: Basic Metabolic Panel:  Recent Labs  16/10/96 0425 06/30/13 0148  NA 132* 129*  K 3.7 4.2  CL 100 98  CO2 22 20  GLUCOSE 167* 232*  BUN 20 18  CREATININE 0.91 0.73  CALCIUM 8.3* 7.9*   CBC:  Recent Labs  06/29/13 0425 06/30/13 0148  WBC 14.7* 26.0*  HGB 8.9* 9.5*  HCT 26.6* 28.1*  MCV 79.2 79.8  PLT 297 463*    Current Meds: . aspirin  81 mg Oral Daily  .  ceFAZolin (ANCEF) IV  2 g Intravenous Q8H  . docusate sodium  100 mg Oral BID  . insulin aspart  0-15 Units Subcutaneous TID WC  . insulin aspart  0-5 Units Subcutaneous QHS  . insulin aspart  6 Units Subcutaneous TID WC  . insulin detemir  20 Units Subcutaneous Daily  . lisinopril  5 mg Oral Daily  . metoprolol tartrate  50 mg Oral BID  . pantoprazole  40 mg Oral Daily  . pregabalin  75 mg Oral BID  . simvastatin  40 mg Oral QPM   ASSESSMENT AND PLAN:  1. CAD/NSTEMI: Elevated troponin last week following I & D of necrotic right foot wound. Cardiac cath 06/28/13 with severe three vessel CAD. The best option for revascularization given mult-vessel disease in setting of poorly controlled DM and HTN appears to be CABG. He did well with his BKA on 06/29/13.  He will need to recover from his BKA but will ask CT surgery to evaluate this week before discharge to SNF to discuss planning for CABG which I would anticipate would not happen until he has fully recovered/rehabbed from his BKA. Continue medical management of CAD for now with ASA, statin, beta blocker, Ace-inh. Would stop heparin drip.   2. Ischemic cardiomyopathy: Medical management     Wood Novacek  9/15/20147:47 AM

## 2013-07-01 NOTE — Progress Notes (Signed)
[facility] PROGRESS NOTE  Albert [name]:[id number] DOB: [birth year] DOA: 06/25/2013 PCP: [name], MD  Assessment/Plan:  1. R BKA - Recommendations per ortho - Discussed with case manager and after review of chart noticed that physical therapy recommended CIR.  As such placed order for CIR evaluation  2. NSTEMI post op - Cardiology on board and managing will defer recommendations to them - Plan is to have CT surgery evaluate prior to d/c to discuss planning for CABG given recent h/o severe three vessel CAD on cardiac cath in 06/28/13  3. MSSA bacteremia - ID on board and managing antibiotics.  Will defer further recommendations to them and once patient ready to go to SNF will place on the antibiotic regimen that they will outline moving forward. - Source suspected to be 2ary to right gangrenous foot   4. DM  - continue diabetic diet - patient is currently on SSI and levemir. Will increase levemir to 23 units qhs - Blood sugars elevated and most likely due to ongoing infection, once infection is ameliorated suspect blood sugars will be easier to control  5. HTN - Relatively well controlled on lisinopril with last creatinine at 0.74 - Will continue to monitor.  6. HPL -stable  - continue statin  Code Status: full Family Communication: no family at beside.  Disposition Plan: Pending continued improvement in condition and once cleared by specialist involved.   Consultants:  ID  Ortho  Cardiology  Procedures: 9/9 R foot XR >>> Extensive soft tissue air consistent with abscess tracking along the volar aspect of the foot somewhat diffusely but concentrated primarily at the level of the metatarsals.  9/9 OR >>> I&D of R foot abscess  9/10- trop pos, tachy  9/10 Echo >>> Left ventricle showed distal septal hypokinesis, EF low normal or mildly decreased  9/12 cath >>3V CAD, EF 40%, CABG vs med mx  9/13 Rt BKA  Lajoyce Corners)  Antibiotics:  Ancef  HPI/Subjective: No new complaints. No acute issues reported overnight to me.  Objective: Filed Vitals:   07/01/13 0412  BP: 131/84  Pulse: 96  Temp: 97.8 F (36.6 C)  Resp: 18    Intake/Output Summary (Last 24 hours) at 07/01/13 1350 Last data filed at 07/01/13 1100  Gross per 24 hour  Intake 1083.62 ml  Output   3070 ml  Net -1986.38 ml   Filed Weights   06/28/13 1620 06/30/13 0700 06/30/13 2122  Weight: 121.8 kg (268 lb 8.3 oz) 119.2 kg (262 lb 12.6 oz) 119.9 kg (264 lb 5.3 oz)    Exam:   General:  Pt in NAD, Alert and Awake  Cardiovascular: RRR, no MRG  Respiratory: CTA BL, no wheezes  Abdomen: soft, NT, ND  Musculoskeletal: no cyanosis or clubbing  Data Reviewed: Basic Metabolic Panel:  Recent Labs Lab 06/25/13 1811 06/26/13 0100  06/27/13 1140 06/28/13 0330 06/29/13 0425 06/30/13 0148 07/01/13 1045  NA 131* 134*  < > 130* 131* 132* 129* 129*  K 3.8 3.7  < > 3.9 3.8 3.7 4.2 3.6  CL 97 104  < > 100 100 100 98 97  CO2 21 22  < > 22 21 22 20 23   GLUCOSE 307* 167*  < > 172* 147* 167* 232* 263*  BUN 32* 25*  < > 23 22 20 18 17   CREATININE 1.64* 1.42*  < > 1.02 0.94 0.91 0.73 0.74  CALCIUM 9.9 8.5  < > 8.1* 8.2* 8.3* 7.9* 7.5*  MG  --  1.7  --   --   --   --   --   --   < > =  values in this interval not displayed. Liver Function Tests:  Recent Labs Lab 06/27/13 0200  AST 64*  ALT 33  ALKPHOS 64  BILITOT 0.5  PROT 5.7*  ALBUMIN 2.0*   No results found for this basename: LIPASE, AMYLASE,  in the last 168 hours No results found for this basename: AMMONIA,  in the last 168 hours CBC:  Recent Labs Lab 06/25/13 1135  06/27/13 0200 06/28/13 0330 06/29/13 0425 06/30/13 0148 07/01/13 1045  WBC 17.1*  < > 10.3 11.4* 14.7* 26.0* 22.3*  NEUTROABS 15.5*  --   --   --   --   --   --   HGB 12.5*  < > 10.5* 9.5* 8.9* 9.5* 8.6*  HCT 36.9*  < > 31.9* 28.4* 26.6* 28.1* 25.3*  MCV 80.0  < > 79.4 78.9 79.2 79.8 79.3   PLT 243  < > 201 242 297 463* 516*  < > = values in this interval not displayed. Cardiac Enzymes:  Recent Labs Lab 06/26/13 0100 06/26/13 0525 06/26/13 1135  TROPONINI 12.42* 10.97* 15.24*   BNP (last 3 results) No results found for this basename: PROBNP,  in the last 8760 hours CBG:  Recent Labs Lab 06/30/13 1136 06/30/13 1708 06/30/13 2116 07/01/13 0727 07/01/13 1129  GLUCAP 220* 208* 236* 230* 267*    Recent Results (from the past 240 hour(s))  CULTURE, BLOOD (ROUTINE X 2)     Status: None   Collection Time    06/26/13  1:00 AM      Result Value Range Status   Specimen Description BLOOD RIGHT ARM   Final   Special Requests     Final   Value: BOTTLES DRAWN AEROBIC AND ANAEROBIC  ,   Culture  Setup Time     Final   Value: 06/26/2013 04:11     Performed at [facility]   Culture     Final   Value: STAPHYLOCOCCUS AUREUS     Note: SUSCEPTIBILITIES PERFORMED ON PREVIOUS CULTURE WITHIN THE LAST 5 DAYS.     Note: Gram Stain Report Called to,Read Back By and Verified With: STACY PHELPS 06/28/13 1155 BY SMITHERSJ     Performed at [facility]   Report Status 06/30/2013 FINAL   Final  CULTURE, BLOOD (ROUTINE X 2)     Status: None   Collection Time    06/26/13  1:08 AM      Result Value Range Status   Specimen Description BLOOD RIGHT ARM   Final   Special Requests BOTTLES DRAWN AEROBIC AND ANAEROBIC    Final   Culture  Setup Time     Final   Value: 06/26/2013 04:10     Performed at [facility]   Culture     Final   Value: STAPHYLOCOCCUS AUREUS     Note: RIFAMPIN AND GENTAMICIN SHOULD NOT BE USED AS SINGLE DRUGS FOR TREATMENT OF STAPH INFECTIONS.     Note: Gram Stain Report Called to,Read Back By and Verified With: JOHN HUNT ON 06/28/2013 AT 8:21P BY Serafina Mitchell     Performed at [facility]   Report Status 06/30/2013 FINAL   Final   Organism ID, Bacteria STAPHYLOCOCCUS AUREUS   Final  MRSA PCR SCREENING     Status: None    Collection Time    06/26/13  8:08 AM      Result Value Range Status   MRSA by PCR NEGATIVE  NEGATIVE Final   Comment:  The GeneXpert MRSA Assay (FDA     approved for NASAL specimens     only), is one component of a     comprehensive MRSA colonization     surveillance program. It is not     intended to diagnose MRSA     infection nor to guide or     monitor treatment for     MRSA infections.  URINE CULTURE     Status: None   Collection Time    06/28/13  6:05 PM      Result Value Range Status   Specimen Description URINE, CATHETERIZED   Final   Special Requests Normal   Final   Culture  Setup Time     Final   Value: 06/28/2013 18:30     Performed at [facility] Count     Final   Value: NO GROWTH     Performed at [facility]   Culture     Final   Value: NO GROWTH     Performed at [facility]   Report Status 06/30/2013 FINAL   Final  CULTURE, BLOOD (ROUTINE X 2)     Status: None   Collection Time    06/29/13  3:10 PM      Result Value Range Status   Specimen Description BLOOD RIGHT ANTECUBITAL   Final   Special Requests BOTTLES DRAWN AEROBIC AND ANAEROBIC 10CC   Final   Culture  Setup Time     Final   Value: 06/29/2013 21:30     Performed at [facility]   Culture     Final   Value:        BLOOD CULTURE RECEIVED NO GROWTH TO DATE CULTURE WILL BE HELD FOR 5 DAYS BEFORE ISSUING A FINAL NEGATIVE REPORT     Performed at [facility]   Report Status PENDING   Incomplete  CULTURE, BLOOD (ROUTINE X 2)     Status: None   Collection Time    06/29/13  3:20 PM      Result Value Range Status   Specimen Description BLOOD RIGHT HAND   Final   Special Requests BOTTLES DRAWN AEROBIC AND ANAEROBIC 10CC   Final   Culture  Setup Time     Final   Value: 06/29/2013 21:31     Performed at [facility]   Culture     Final   Value:        BLOOD CULTURE RECEIVED NO GROWTH TO DATE CULTURE WILL BE HELD FOR 5 DAYS BEFORE  ISSUING A FINAL NEGATIVE REPORT     Performed at [facility]   Report Status PENDING   Incomplete     Studies: No results found.  Scheduled Meds: . aspirin  81 mg Oral Daily  .  ceFAZolin (ANCEF) IV  2 g Intravenous Q8H  . docusate sodium  100 mg Oral BID  . insulin aspart  0-15 Units Subcutaneous TID WC  . insulin aspart  0-5 Units Subcutaneous QHS  . insulin aspart  6 Units Subcutaneous TID WC  . insulin detemir  20 Units Subcutaneous Daily  . lisinopril  5 mg Oral Daily  . metoprolol tartrate  50 mg Oral BID  . pantoprazole  40 mg Oral Daily  . pregabalin  75 mg Oral BID  . simvastatin  40 mg Oral QPM   Continuous Infusions: . sodium chloride 10 mL/hr (07/01/13 0401)    Principal Problem:  Foot abscess, right Active Problems:   Hypercholesteremia   Diabetes mellitus out of control   Essential hypertension, benign   Atherosclerotic peripheral vascular disease   AKI (acute kidney injury)   Sepsis   Dehydration with hyponatremia   Leukocytosis, unspecified   Sepsis(995.91)   SEMI (subendocardial myocardial infarction)   Coronary atherosclerosis of native coronary artery    Time spent: > 35 minutes    Penny Pia  [facility] Pager [phone]. If 7PM-7AM, please contact night-coverage at [email or web address], password [facility] 07/01/2013, 1:50 PM  LOS: 6 days

## 2013-07-01 NOTE — Care Management Note (Signed)
    Page 1 of 2   07/04/2013     10:50:23 AM   CARE MANAGEMENT NOTE 07/04/2013  Patient:  KRIS, NO   Account Number:  [id number]  Date Initiated:  06/28/2013  Documentation initiated by:  DAVIS,RHONDA  Subjective/Objective Assessment:   patient on day 3 s/p extensive debridement of wounds and decubitus to feet, to have cardiac cath this p,m [id number] in preparation for a bka     Action/Plan:   tbd most likely snf   Anticipated DC Date:  07/03/2013   Anticipated DC Plan:  IP REHAB FACILITY  In-house referral  Clinical Social Worker      DC Planning Services  NA      [facility] Choice  NA   Choice offered to / List presented to:  NA   DME arranged  NA      DME agency  NA     HH arranged  NA      HH agency  NA   Status of service:  In process, will continue to follow Medicare Important Message given?  NA - LOS <3 / Initial given by admissions (If response is "NO", the following Medicare IM given date fields will be blank) Date Medicare IM given:   Date Additional Medicare IM given:    Discharge Disposition:    Per UR Regulation:  Reviewed for med. necessity/level of care/duration of stay  If discussed at Long Length of Stay Meetings, dates discussed:   07/02/2013    Comments:  07/04/13 10;49 Letha Cape RN, BSN [phone] patient dc to CIR .  07/02/13 14:54 Letha Cape RN, BSN [phone] Dr. Cena Benton is MD for this patient and he put the CIR consult in , patient is s/p r bka 9/13, per physical therapy recs cir.  07/01/13 11:41 Letha Cape RN,BSN [phone] per physical therapy recs CIR vs snf,  per Britta Mccreedy  with CIR they will need a CIR consult,  I called Dr. Sung Amabile and left message that we need this order in for Rehab Consult.    [id number] Earlene Plater, RN, BSN, CCM [phone] Chart Reviewed for discharge and hospital needs. Discharge needs at time of review:  None Review of patient progress due on [id number].

## 2013-07-01 NOTE — Progress Notes (Signed)
INFECTIOUS DISEASE PROGRESS NOTE  ID: Albert [name] is a 52 y.o. male with  Principal Problem:   Foot abscess, right Active Problems:   Hypercholesteremia   Diabetes mellitus out of control   Essential hypertension, benign   Atherosclerotic peripheral vascular disease   AKI (acute kidney injury)   Sepsis   Dehydration with hyponatremia   Leukocytosis, unspecified   Sepsis(995.91)   SEMI (subendocardial myocardial infarction)   Coronary atherosclerosis of native coronary artery  Subjective: These pain meds have got me all loopy  Abtx:  Anti-infectives   Start     Dose/Rate Route Frequency Ordered Stop   06/30/13 1400  ceFAZolin (ANCEF) IVPB 2 g/50 mL premix  Status:  Discontinued     2 g 100 mL/hr over 30 Minutes Intravenous 3 times per day 06/30/13 1015 06/30/13 1017   06/30/13 1100  ceFAZolin (ANCEF) IVPB 2 g/50 mL premix     2 g 100 mL/hr over 30 Minutes Intravenous 3 times per day 06/30/13 1017     06/29/13 2000  vancomycin (VANCOCIN) 1,750 mg in sodium chloride 0.9 % 500 mL IVPB  Status:  Discontinued     1,750 mg 250 mL/hr over 120 Minutes Intravenous Every 12 hours 06/29/13 1343 06/30/13 1015   06/29/13 1400  ceFAZolin (ANCEF) IVPB 2 g/50 mL premix  Status:  Discontinued     2 g 100 mL/hr over 30 Minutes Intravenous 3 times per day 06/29/13 1018 06/29/13 1024   06/29/13 1400  ceFAZolin (ANCEF) IVPB 2 g/50 mL premix     2 g 100 mL/hr over 30 Minutes Intravenous 3 times per day 06/29/13 1343 06/29/13 1437   06/27/13 1000  vancomycin (VANCOCIN) 1,500 mg in sodium chloride 0.9 % 500 mL IVPB  Status:  Discontinued     1,500 mg 250 mL/hr over 120 Minutes Intravenous Every 12 hours 06/27/13 0740 06/29/13 1343   06/26/13 1400  vancomycin (VANCOCIN) 1,500 mg in sodium chloride 0.9 % 500 mL IVPB  Status:  Discontinued     1,500 mg 250 mL/hr over 120 Minutes Intravenous Every 24 hours 06/25/13 1601 06/27/13 0740   06/26/13 0000  clindamycin (CLEOCIN) IVPB 900 mg  Status:   Discontinued     900 mg 100 mL/hr over 30 Minutes Intravenous 4 times per day 06/25/13 2327 06/29/13 1018   06/25/13 2118  polymyxin B 500,000 Units, bacitracin 50,000 Units in sodium chloride irrigation 0.9 % 500 mL irrigation  Status:  Discontinued       As needed 06/25/13 2120 06/25/13 2141   06/25/13 2000  piperacillin-tazobactam (ZOSYN) IVPB 3.375 g  Status:  Discontinued     3.375 g 12.5 mL/hr over 240 Minutes Intravenous Every 8 hours 06/25/13 1600 06/29/13 1018   06/25/13 1600  vancomycin (VANCOCIN) 500 mg in sodium chloride 0.9 % 100 mL IVPB     500 mg 100 mL/hr over 60 Minutes Intravenous STAT 06/25/13 1558 06/25/13 1815   06/25/13 1130  piperacillin-tazobactam (ZOSYN) IVPB 3.375 g     3.375 g 100 mL/hr over 30 Minutes Intravenous STAT 06/25/13 1048 06/25/13 1301   06/25/13 1100  vancomycin (VANCOCIN) IVPB 1000 mg/200 mL premix     1,000 mg 200 mL/hr over 60 Minutes Intravenous STAT 06/25/13 1048 06/25/13 1402      Medications:  Scheduled: . aspirin  81 mg Oral Daily  .  ceFAZolin (ANCEF) IV  2 g Intravenous Q8H  . docusate sodium  100 mg Oral BID  . insulin aspart  0-15  Units Subcutaneous TID WC  . insulin aspart  0-5 Units Subcutaneous QHS  . insulin aspart  6 Units Subcutaneous TID WC  . [START ON 07/02/2013] insulin detemir  23 Units Subcutaneous Daily  . lisinopril  5 mg Oral Daily  . metoprolol tartrate  50 mg Oral BID  . pantoprazole  40 mg Oral Daily  . pregabalin  75 mg Oral BID  . simvastatin  40 mg Oral QPM    Objective: Vital signs in last 24 hours: Temp:  [97.8 F (36.6 C)-98.6 F (37 C)] 97.8 F (36.6 C) (09/15 0412) Pulse Rate:  [96-102] 96 (09/15 0412) Resp:  [18] 18 (09/15 0412) BP: (112-131)/(75-84) 131/84 mmHg (09/15 0412) SpO2:  [93 %-97 %] 97 % (09/15 0412) Weight:  [119.9 kg (264 lb 5.3 oz)] 119.9 kg (264 lb 5.3 oz) (09/14 2122)   General appearance: alert, cooperative and no distress Resp: clear to auscultation bilaterally Cardio:  regular rate and rhythm GI: normal findings: bowel sounds normal and soft, non-tender Extremities: R stump wrapped. L foot wrapped.   Lab Results  Recent Labs  06/30/13 0148 07/01/13 1045  WBC 26.0* 22.3*  HGB 9.5* 8.6*  HCT 28.1* 25.3*  NA 129* 129*  K 4.2 3.6  CL 98 97  CO2 20 23  BUN 18 17  CREATININE 0.73 0.74   Liver Panel No results found for this basename: PROT, ALBUMIN, AST, ALT, ALKPHOS, BILITOT, BILIDIR, IBILI,  in the last 72 hours Sedimentation Rate No results found for this basename: ESRSEDRATE,  in the last 72 hours C-Reactive Protein No results found for this basename: CRP,  in the last 72 hours  Microbiology: Recent Results (from the past 240 hour(s))  CULTURE, BLOOD (ROUTINE X 2)     Status: None   Collection Time    06/26/13  1:00 AM      Result Value Range Status   Specimen Description BLOOD RIGHT ARM   Final   Special Requests     Final   Value: BOTTLES DRAWN AEROBIC AND ANAEROBIC  ,   Culture  Setup Time     Final   Value: 06/26/2013 04:11     Performed at [facility]   Culture     Final   Value: STAPHYLOCOCCUS AUREUS     Note: SUSCEPTIBILITIES PERFORMED ON PREVIOUS CULTURE WITHIN THE LAST 5 DAYS.     Note: Gram Stain Report Called to,Read Back By and Verified With: STACY PHELPS 06/28/13 1155 BY SMITHERSJ     Performed at [facility]   Report Status 06/30/2013 FINAL   Final  CULTURE, BLOOD (ROUTINE X 2)     Status: None   Collection Time    06/26/13  1:08 AM      Result Value Range Status   Specimen Description BLOOD RIGHT ARM   Final   Special Requests BOTTLES DRAWN AEROBIC AND ANAEROBIC    Final   Culture  Setup Time     Final   Value: 06/26/2013 04:10     Performed at [facility]   Culture     Final   Value: STAPHYLOCOCCUS AUREUS     Note: RIFAMPIN AND GENTAMICIN SHOULD NOT BE USED AS SINGLE DRUGS FOR TREATMENT OF STAPH INFECTIONS.     Note: Gram Stain Report Called to,Read Back By and  Verified With: JOHN HUNT ON 06/28/2013 AT 8:21P BY Serafina Mitchell     Performed at [facility]   Report Status 06/30/2013 FINAL  Final   Organism ID, Bacteria STAPHYLOCOCCUS AUREUS   Final  MRSA PCR SCREENING     Status: None   Collection Time    06/26/13  8:08 AM      Result Value Range Status   MRSA by PCR NEGATIVE  NEGATIVE Final   Comment:            The GeneXpert MRSA Assay (FDA     approved for NASAL specimens     only), is one component of a     comprehensive MRSA colonization     surveillance program. It is not     intended to diagnose MRSA     infection nor to guide or     monitor treatment for     MRSA infections.  URINE CULTURE     Status: None   Collection Time    06/28/13  6:05 PM      Result Value Range Status   Specimen Description URINE, CATHETERIZED   Final   Special Requests Normal   Final   Culture  Setup Time     Final   Value: 06/28/2013 18:30     Performed at [facility] Count     Final   Value: NO GROWTH     Performed at [facility]   Culture     Final   Value: NO GROWTH     Performed at [facility]   Report Status 06/30/2013 FINAL   Final  CULTURE, BLOOD (ROUTINE X 2)     Status: None   Collection Time    06/29/13  3:10 PM      Result Value Range Status   Specimen Description BLOOD RIGHT ANTECUBITAL   Final   Special Requests BOTTLES DRAWN AEROBIC AND ANAEROBIC 10CC   Final   Culture  Setup Time     Final   Value: 06/29/2013 21:30     Performed at [facility]   Culture     Final   Value:        BLOOD CULTURE RECEIVED NO GROWTH TO DATE CULTURE WILL BE HELD FOR 5 DAYS BEFORE ISSUING A FINAL NEGATIVE REPORT     Performed at [facility]   Report Status PENDING   Incomplete  CULTURE, BLOOD (ROUTINE X 2)     Status: None   Collection Time    06/29/13  3:20 PM      Result Value Range Status   Specimen Description BLOOD RIGHT HAND   Final   Special Requests BOTTLES DRAWN AEROBIC AND ANAEROBIC  10CC   Final   Culture  Setup Time     Final   Value: 06/29/2013 21:31     Performed at [facility]   Culture     Final   Value:        BLOOD CULTURE RECEIVED NO GROWTH TO DATE CULTURE WILL BE HELD FOR 5 DAYS BEFORE ISSUING A FINAL NEGATIVE REPORT     Performed at [facility]   Report Status PENDING   Incomplete    Studies/Results: No results found.   Assessment/Plan: Gangrene R foot  AKA RLE 9-13 MSSA bacteremia NSTEMI  DM  Total days of antibiotics: 6 Ancef 9-14     Would favor TEE He is to be eval for CABG and making sure he does not have endocarditis would be worthwhile in light of this.        Albert [name] Infectious Diseases (pager)  [phone] [email or web address] 07/01/2013, 2:18 PM  LOS: 6 days

## 2013-07-01 NOTE — Consult Note (Signed)
Physical Medicine and Rehabilitation Consult Reason for Consult: Right BKA Referring Physician: [facility]    HPI: Albert [name] is a 52 y.o. 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 Admitted 06/25/2013 with nonhealing ulcer to the right foot and Charcot collapse right foot. Underwent irrigation and debridement of right foot plantar abscess 06/25/2013 per Dr. Magnus Ivan. Postoperatively with hypotension and tachycardia. Cardiology services consulted with findings of elevated troponin levels of 10. Echocardiogram with distal septal hypokinesis with overall low-normal LVEF. Elevated troponin levels are likely related to underlying sepsis. Underwent cardiac catheterization 06/28/2013 with findings of triple vessel CAD NSTEMI secondary to demand ischemia and mild LV systolic dysfunction. Patient placed on medical management there is concern of possible need for CABG. Patient with ongoing ischemic changes right lower extremity and underwent right below-knee amputation 06/29/2013 per Dr. Lajoyce Corners. Cardiothoracic surgery followup to consider CABG after recovery from BKA. Patient presently maintained on aspirin therapy. Physical therapy evaluation completed 06/30/2013 with recommendations of physical medicine rehabilitation consult to consider inpatient rehabilitation services.   Review of Systems  Constitutional: Positive for fever.  Respiratory: Positive for cough.   Cardiovascular: Positive for leg swelling.  Neurological: Positive for weakness.  All other systems reviewed and are negative.   Past Medical History  Diagnosis Date  . Hypertension   . Diabetes mellitus without complication ~2000    last HbA1c ~9  . Hypercholesteremia   . CVA (cerebral infarction) 2011    R hand deficit  . Chronic  sinusitis   . Allergic rhinitis   . Chronic cough   . Complication of anesthesia     couldn't swallow and talk   Past Surgical History  Procedure Laterality Date  . 4th toe amputation Left Jan. 2014    4th toe in [place]  . Tonsillectomy and adenoidectomy    . Penile prosthesis implant    . I&d extremity Left 12/08/2012    Procedure: IRRIGATION AND DEBRIDEMENT EXTREMITY;  Surgeon: Kathryne Hitch, MD;  Location: [facility] OR;  Service: Orthopedics;  Laterality: Left;  . Amputation Left 12/08/2012    Procedure: AMPUTATION DIGIT;  Surgeon: Kathryne Hitch, MD;  Location: [facility] OR;  Service: Orthopedics;  Laterality: Left;  3rd toe amputation possible 5th toe amputation  . I&d extremity Left 12/11/2012    Procedure: REPEAT I&D LEFT FOOT;  Surgeon: Kathryne Hitch, MD;  Location: [facility] OR;  Service: Orthopedics;  Laterality: Left;  . Amputation Left 12/11/2012    Procedure: Amputation of 2nd Toe;  Surgeon: Kathryne Hitch, MD;  Location: [facility] OR;  Service: Orthopedics;  Laterality: Left;  . Bypass graft popliteal to tibial Left 12/13/2012    Procedure: BYPASS GRAFT POPLITEAL TO TIBIAL;  Surgeon: Nada Libman, MD;  Location: [facility] OR;  Service: Vascular;  Laterality: Left;  Left Popliteal to Posterior Tibial Bypass Graft with reversed saphenous vein graft  . Incision and drainage Right 06/25/2013    Procedure: INCISION AND DRAINAGE RIGHT FOOT;  Surgeon: Kathryne Hitch, MD;  Location: [facility] ORS;  Service: Orthopedics;  Laterality: Right;   Family History  Problem Relation Age of Onset  . Diabetes    . Pancreatic cancer Mother   . Diabetes Mother   . Hyperlipidemia Mother   . Breast cancer Sister   . Depression Maternal Grandmother   . Heart disease Maternal Grandmother   .  Depression Maternal Grandfather   . Heart disease Maternal Grandfather   . Depression Paternal Grandmother   . Heart disease Paternal Grandmother   . Depression Paternal Grandfather   . Heart disease  Paternal Grandfather    Social History:  reports that he has quit smoking. He has never used smokeless tobacco. He reports that he does not drink alcohol or use illicit drugs. Allergies:  Allergies  Allergen Reactions  . Amoxicillin     vomiting   Medications Prior to Admission  Medication Sig Dispense Refill  . aspirin 81 MG tablet Take 81 mg by mouth daily.      Marland Kitchen glyBURIDE-metformin (GLUCOVANCE) 2.5-500 MG per tablet Take 1 tablet by mouth daily with breakfast.      . insulin aspart (NOVOLOG FLEXPEN) 100 UNIT/ML injection Inject 12 Units into the skin 3 (three) times daily. Take every time he eats per patient      . insulin detemir (LEVEMIR) 100 UNIT/ML injection Inject 20 Units into the skin every evening.      . irbesartan-hydrochlorothiazide (AVALIDE) 300-12.5 MG per tablet Take 1 tablet by mouth daily.      . metoprolol tartrate (LOPRESSOR) 25 MG tablet Take 25 mg by mouth 2 (two) times daily.      . ondansetron (ZOFRAN) 8 MG tablet Take by mouth every 8 (eight) hours as needed for nausea. For upset stomach      . pregabalin (LYRICA) 75 MG capsule Take 75 mg by mouth 2 (two) times daily.      . Rivaroxaban (XARELTO) 20 MG TABS Take 1 tablet (20 mg total) by mouth daily with supper.  30 tablet  2  . silver sulfADIAZINE (SILVADENE) 1 % cream Apply 1 application topically daily.      . simvastatin (ZOCOR) 40 MG tablet Take 40 mg by mouth every evening.        Home: Home Living Family/patient expects to be discharged to:: Private residence Living Arrangements: Spouse/significant other Available Help at Discharge: Available PRN/intermittently Type of Home: House Home Access: Level entry Home Layout: Two level Alternate Level Stairs-Number of Steps: 1 step between living areas Alternate Level Stairs-Rails: None Home Equipment: None  Functional History:   Functional Status:  Mobility: Bed Mobility Bed Mobility: Supine to Sit Supine to Sit: 5: Supervision;HOB elevated;With  rails Transfers Transfers: Lateral/Scoot Transfers Lateral/Scoot Transfers: 4: Min assist;From elevated surface Ambulation/Gait Ambulation/Gait Assistance: Not tested (comment) Stairs: No Wheelchair Mobility Wheelchair Mobility: No  ADL:    Cognition: Cognition Overall Cognitive Status: Within Functional Limits for tasks assessed Orientation Level: Oriented X4 Cognition Arousal/Alertness: Awake/alert Behavior During Therapy: WFL for tasks assessed/performed Overall Cognitive Status: Within Functional Limits for tasks assessed  Blood pressure 131/84, pulse 96, temperature 97.8 F (36.6 C), temperature source Oral, resp. rate 18, height 5\' 11"  (1.803 m), weight 119.9 kg (264 lb 5.3 oz), SpO2 97.00%. Physical Exam  Vitals reviewed. Constitutional: He is oriented to person, place, and time.  HENT:  Head: Normocephalic.  Eyes: EOM are normal.  Neck: Normal range of motion. Neck supple. No thyromegaly present.  Cardiovascular: Normal rate.   Pulmonary/Chest: Effort normal and breath sounds normal. No respiratory distress.  Abdominal: Soft. Bowel sounds are normal. He exhibits no distension.  Neurological: He is alert and oriented to person, place, and time.  Pt is alret and appropriatel. Right deltoid is 3/5. Bicep/tricep 4/5. HI are 3-. Sensation grossly intact except for distal LLE.   Skin:  Right amputation site is dressed. There is a heavy  dressing also to the left foot and appropriately tender  Psychiatric: He has a normal mood and affect. His behavior is normal. Judgment and thought content normal.    Results for orders placed during the hospital encounter of 06/25/13 (from the past 24 hour(s))  GLUCOSE, CAPILLARY     Status: Abnormal   Collection Time    06/30/13  5:08 PM      Result Value Range   Glucose-Capillary 208 (*) 70 - 99 mg/dL  GLUCOSE, CAPILLARY     Status: Abnormal   Collection Time    06/30/13  9:16 PM      Result Value Range   Glucose-Capillary 236  (*) 70 - 99 mg/dL   Comment 1 Documented in Chart     Comment 2 Notify RN    GLUCOSE, CAPILLARY     Status: Abnormal   Collection Time    07/01/13  7:27 AM      Result Value Range   Glucose-Capillary 230 (*) 70 - 99 mg/dL   Comment 1 Documented in Chart     Comment 2 Notify RN    CBC     Status: Abnormal   Collection Time    07/01/13 10:45 AM      Result Value Range   WBC 22.3 (*) 4.0 - 10.5 K/uL   RBC 3.19 (*) 4.22 - 5.81 MIL/uL   Hemoglobin 8.6 (*) 13.0 - 17.0 g/dL   HCT 04.5 (*) 40.9 - 81.1 %   MCV 79.3  78.0 - 100.0 fL   MCH 27.0  26.0 - 34.0 pg   MCHC 34.0  30.0 - 36.0 g/dL   RDW 91.4  78.2 - 95.6 %   Platelets 516 (*) 150 - 400 K/uL  HEPARIN LEVEL (UNFRACTIONATED)     Status: Abnormal   Collection Time    07/01/13 10:45 AM      Result Value Range   Heparin Unfractionated 1.09 (*) 0.30 - 0.70 IU/mL  BASIC METABOLIC PANEL     Status: Abnormal   Collection Time    07/01/13 10:45 AM      Result Value Range   Sodium 129 (*) 135 - 145 mEq/L   Potassium 3.6  3.5 - 5.1 mEq/L   Chloride 97  96 - 112 mEq/L   CO2 23  19 - 32 mEq/L   Glucose, Bld 263 (*) 70 - 99 mg/dL   BUN 17  6 - 23 mg/dL   Creatinine, Ser 2.13  0.50 - 1.35 mg/dL   Calcium 7.5 (*) 8.4 - 10.5 mg/dL   GFR calc non Af Amer >90  >90 mL/min   GFR calc Af Amer >90  >90 mL/min  GLUCOSE, CAPILLARY     Status: Abnormal   Collection Time    07/01/13 11:29 AM      Result Value Range   Glucose-Capillary 267 (*) 70 - 99 mg/dL   Comment 1 Documented in Chart     Comment 2 Notify RN     No results found.   Assessment/Plan: Diagnosis: right BKA due to Charcot foot, abscess. Has left foot wound, prior left CVA as well with residual right HP 1. Does the need for close, 24 hr/day medical supervision in concert with the patient's rehab needs make it unreasonable for this patient to be served in a less intensive setting? Yes 2. Co-Morbidities requiring supervision/potential complications: dm, wound care, hx of  mi 3. Due to bladder management, bowel management, safety, skin/wound care, disease management, medication administration, pain  management and patient education, does the patient require 24 hr/day rehab nursing? Yes 4. Does the patient require coordinated care of a physician, rehab nurse, PT (1-2 hrs/day, 5 days/week) and OT (1-2 hrs/day, 5 days/week) to address physical and functional deficits in the context of the above medical diagnosis(es)? Yes Addressing deficits in the following areas: balance, endurance, locomotion, strength, transferring, bowel/bladder control, bathing, dressing, feeding, grooming, toileting and psychosocial support 5. Can the patient actively participate in an intensive therapy program of at least 3 hrs of therapy per day at least 5 days per week? Yes 6. The potential for patient to make measurable gains while on inpatient rehab is good 7. Anticipated functional outcomes upon discharge from inpatient rehab are w/c mod I to supervision with PT, w/c mod I to supervision with OT, n/a with SLP. 8. Estimated rehab length of stay to reach the above functional goals is: 10-14 days 9. Does the patient have adequate social supports to accommodate these discharge functional goals? Yes and Potentially 10. Anticipated D/C setting: Home 11. Anticipated post D/C treatments: HH therapy 12. Overall Rehab/Functional Prognosis: excellent  RECOMMENDATIONS: This patient's condition is appropriate for continued rehabilitative care in the following setting: CIR Patient has agreed to participate in recommended program. Yes Note that insurance prior authorization may be required for reimbursement for recommended care.  Comment: Rehab RN to follow up.   Ranelle Oyster, MD, [place] [facility]     07/01/2013

## 2013-07-01 NOTE — Progress Notes (Signed)
Pt has not voided since foley removal. Bladder scan done and showed 202 cc in bladder. Schorr, NP paged. Will give patient more time to void and if he feels uncomfortable will notify her.

## 2013-07-01 NOTE — Progress Notes (Signed)
ANTICOAGULATION CONSULT NOTE - Follow Up Consult  Pharmacy Consult for heparin Indication: NSTEMI  Allergies  Allergen Reactions  . Amoxicillin     vomiting    Patient Measurements: Height: 5\' 11"  (180.3 cm) Weight: 264 lb 5.3 oz (119.9 kg) IBW/kg (Calculated) : 75.3 Heparin Dosing Weight: 102kg  Vital Signs: Temp: 97.8 F (36.6 C) (09/15 0412) Temp src: Oral (09/15 0412) BP: 131/84 mmHg (09/15 0412) Pulse Rate: 96 (09/15 0412)  Labs:  Recent Labs  06/29/13 0425  06/29/13 1930 06/30/13 0148 07/01/13 1045  HGB 8.9*  --   --  9.5* 8.6*  HCT 26.6*  --   --  28.1* 25.3*  PLT 297  --   --  463* 516*  HEPARINUNFRC  --   < > 0.65 0.55 1.09*  CREATININE 0.91  --   --  0.73 0.74  < > = values in this interval not displayed.  Estimated Creatinine Clearance: 145.5 ml/min (by C-G formula based on Cr of 0.74).   Medications:  Heparin @ 3300 units/hr  Assessment: 52 YO M who was initiated on heparin for NSTEMI.  Pt now s/p cath with plans for medical management of 3VD at this time with TCTS consult for possible CABG.    Pt was on Xarelto PTA for PVD s/p fem-pop bypass (not a labeled indication for Xarelto use).  I spoke with both Drs. McAlhany and [place] and it ws determined to d/c heparin and not restart Xarelto at this time.    Goal of Therapy:  Heparin level 0.3-0.7 units/ml Monitor platelets by anticoagulation protocol: Yes   Plan:  1. D/C heparin and associated labs. 2. SCDs for VTE prophylaxis.  [employer], Pharm.D., BCPS Clinical Pharmacist Pager [phone] 07/01/2013 12:12 PM

## 2013-07-01 NOTE — Progress Notes (Signed)
Inpatient Diabetes Program Recommendations  AACE/ADA: New Consensus Statement on Inpatient Glycemic Control (2013)  Target Ranges:  Prepandial:   less than 140 mg/dL      Peak postprandial:   less than 180 mg/dL (1-2 hours)      Critically ill patients:  140 - 180 mg/dL   Reason for Visit: Results for ARTHER, HEISLER (MRN [id number]) as of 07/01/2013 12:17  Ref. Range 06/30/2013 11:36 06/30/2013 17:08 06/30/2013 21:16 07/01/2013 07:27 07/01/2013 11:29  Glucose-Capillary Latest Range: 70-99 mg/dL 528 (H) 413 (H) 244 (H) 230 (H) 267 (H)   Results for LAMONT, TANT (MRN [id number]) as of 07/01/2013 12:17  Ref. Range 06/25/2013 16:30  Hemoglobin A1C Latest Range: <5.7 % 8.2 (H)   Please consider increasing Levemir to 25 units daily.  Also consider increasing Novolog meal coverage to 10 units tid with meals.  Will follow.    Beryl Meager, RN, BC-ADM Inpatient Diabetes Coordinator Pager [phone]

## 2013-07-01 NOTE — Progress Notes (Signed)
Patient ID: Albert [name], male   DOB: 1961-06-28, 52 y.o.   MRN: [id number] Postoperative day 2 transtibial amputation. Patient feels like he is doing better. Anticipate discharge to skilled nursing facility. I will followup in the office 2 weeks after discharge.

## 2013-07-02 ENCOUNTER — Other Ambulatory Visit: Payer: Self-pay

## 2013-07-02 ENCOUNTER — Encounter (HOSPITAL_COMMUNITY): Payer: Self-pay | Admitting: Orthopedic Surgery

## 2013-07-02 ENCOUNTER — Encounter (HOSPITAL_COMMUNITY): Payer: 59

## 2013-07-02 DIAGNOSIS — I2510 Atherosclerotic heart disease of native coronary artery without angina pectoris: Secondary | ICD-10-CM

## 2013-07-02 LAB — GLUCOSE, CAPILLARY
Glucose-Capillary: 228 mg/dL — ABNORMAL HIGH (ref 70–99)
Glucose-Capillary: 252 mg/dL — ABNORMAL HIGH (ref 70–99)

## 2013-07-02 MED ORDER — SODIUM CHLORIDE 0.9 % IJ SOLN
10.0000 mL | INTRAMUSCULAR | Status: DC | PRN
  Administered 2013-07-03: 06:00:00 10 mL

## 2013-07-02 MED ORDER — SODIUM CHLORIDE 0.9 % IV SOLN: INTRAVENOUS | Status: DC

## 2013-07-02 NOTE — Progress Notes (Addendum)
    SUBJECTIVE: No chest pain or SOB.   BP 142/99  Pulse 107  Temp(Src) 98.5 F (36.9 C) (Oral)  Resp 18  Ht 5\' 11"  (1.803 m)  Wt 266 lb 12.1 oz (121 kg)  BMI 37.22 kg/m2  SpO2 98%  Intake/Output Summary (Last 24 hours) at 07/02/13 0758 Last data filed at 07/01/13 2259  Gross per 24 hour  Intake    240 ml  Output   1550 ml  Net  -1310 ml    PHYSICAL EXAM General: Well developed, well nourished, in no acute distress. Alert and oriented x 3.  Psych:  Good affect, responds appropriately Neck: No JVD. No masses noted.  Lungs: Clear bilaterally with no wheezes or rhonci noted.  Heart: RRR with no murmurs noted. Abdomen: Bowel sounds are present. Soft, non-tender.  Extremities: Right BKA  LABS: Basic Metabolic Panel:  Recent Labs  19/14/78 0148 07/01/13 1045  NA 129* 129*  K 4.2 3.6  CL 98 97  CO2 20 23  GLUCOSE 232* 263*  BUN 18 17  CREATININE 0.73 0.74  CALCIUM 7.9* 7.5*   CBC:  Recent Labs  06/30/13 0148 07/01/13 1045  WBC 26.0* 22.3*  HGB 9.5* 8.6*  HCT 28.1* 25.3*  MCV 79.8 79.3  PLT 463* 516*   Current Meds: . aspirin  81 mg Oral Daily  .  ceFAZolin (ANCEF) IV  2 g Intravenous Q8H  . docusate sodium  100 mg Oral BID  . insulin aspart  0-15 Units Subcutaneous TID WC  . insulin aspart  0-5 Units Subcutaneous QHS  . insulin aspart  6 Units Subcutaneous TID WC  . insulin detemir  23 Units Subcutaneous Daily  . lisinopril  5 mg Oral Daily  . metoprolol tartrate  50 mg Oral BID  . pantoprazole  40 mg Oral Daily  . pregabalin  75 mg Oral BID  . simvastatin  40 mg Oral QPM   ASSESSMENT AND PLAN:  1. CAD/NSTEMI: Elevated troponin last week following I & D of necrotic right foot wound. Cardiac cath 06/28/13 with severe three vessel CAD. The best option for revascularization given mult-vessel disease in setting of poorly controlled DM and HTN appears to be CABG. He did well with his BKA on 06/29/13. He will need to recover from his BKA but will ask CT  surgery to evaluate this week before discharge to SNF to discuss planning for CABG which I would anticipate would not happen until he has fully recovered/rehabbed from his BKA. Continue medical management of CAD for now with ASA, statin, beta blocker, Ace-inh.   2. Ischemic cardiomyopathy: Medical management  3. MSSA bacteremia: Reviewed ID recs for TEE to exclude endocarditis. Will plan TEE in am 07/03/13. NPO at midnight. I have reviewed the procedure with the patient and he agrees to proceed.     MCALHANY,CHRISTOPHER  9/16/20147:58 AM

## 2013-07-02 NOTE — Progress Notes (Addendum)
Inpatient Diabetes Program Recommendations  AACE/ADA: New Consensus Statement on Inpatient Glycemic Control (2013)  Target Ranges:  Prepandial:   less than 140 mg/dL      Peak postprandial:   less than 180 mg/dL (1-2 hours)      Critically ill patients:  140 - 180 mg/dL     Results for LYNDA, CAPISTRAN (MRN [id number]) as of 07/02/2013 13:26  Ref. Range 07/01/2013 07:27 07/01/2013 11:29 07/01/2013 16:58 07/01/2013 21:08  Glucose-Capillary Latest Range: 70-99 mg/dL 782 (H) 956 (H) 213 (H) 149 (H)    Results for DEANDREW, HOECKER (MRN [id number]) as of 07/02/2013 13:26  Ref. Range 07/02/2013 08:16 07/02/2013 12:11  Glucose-Capillary Latest Range: 70-99 mg/dL 629 (H) 528 (H)    **Patient eating 100% of meals.  Having elevated glucose levels.  **Noted Levemir increased to 23 units daily today  **MD- Please consider increasing Novolog meal coverage to 8 units tid with meals (currently ordered as Novolog 6 units tid with meals)   Will follow. Ambrose Finland RN, MSN, CDE Diabetes Coordinator Inpatient Diabetes Program Team Pager: [phone] (8a-10p)

## 2013-07-02 NOTE — Progress Notes (Signed)
[facility] PROGRESS NOTE  Albert [name]:[id number] DOB: 1961-09-15 DOA: 06/25/2013 PCP: [name], MD  Assessment/Plan:  1. R BKA - Recommendations per ortho - Discussed with case manager and after review of chart noticed that physical therapy recommended CIR.  As such placed order for CIR evaluation  2. NSTEMI post op - Cardiology on board and managing will defer recommendations to them - Plan is to have CT surgery evaluate prior to d/c to discuss planning for CABG given recent h/o severe three vessel CAD on cardiac cath in 06/28/13 - Plan at this juncture is to manage medically: ASA, statin, beta blocker, Ace-inh.   3. MSSA bacteremia - ID on board and managing antibiotics.  Will defer further recommendations to them and once patient ready to go to SNF will place on the antibiotic regimen that they will outline moving forward. - Source suspected to be 2ary to right gangrenous foot  - Recommendations currently for TEE  4. DM  - continue diabetic diet - patient is currently on SSI and levemir. Will increase levemir to 23 units qhs - Blood sugars elevated and most likely due to ongoing infection, once infection is ameliorated suspect blood sugars will be easier to control  5. HTN - Relatively well controlled on lisinopril with last creatinine at 0.74 - Will continue to monitor.  6. HPL -stable  - continue statin  Code Status: full Family Communication: no family at beside.  Disposition Plan: Pending continued improvement in condition and once cleared by specialist involved.   Consultants:  ID  Ortho  Cardiology  Procedures: 9/9 R foot XR >>> Extensive soft tissue air consistent with abscess tracking along the volar aspect of the foot somewhat diffusely but concentrated primarily at the level of the metatarsals.  9/9 OR >>> I&D of R foot abscess  9/10- trop pos, tachy  9/10 Echo >>> Left ventricle showed distal septal hypokinesis, EF low normal or  mildly decreased  9/12 cath >>3V CAD, EF 40%, CABG vs med mx  9/13 Rt BKA [place] [place])  Antibiotics:  Ancef  HPI/Subjective: No new complaints. No acute issues reported overnight to me.  Objective: Filed Vitals:   07/02/13 1421  BP: 124/74  Pulse: 110  Temp: 98.7 F (37.1 C)  Resp: 20    Intake/Output Summary (Last 24 hours) at 07/02/13 1452 Last data filed at 07/02/13 1300  Gross per 24 hour  Intake    600 ml  Output    550 ml  Net     50 ml   Filed Weights   06/30/13 0700 06/30/13 2122 07/01/13 2011  Weight: 119.2 kg (262 lb 12.6 oz) 119.9 kg (264 lb 5.3 oz) 121 kg (266 lb 12.1 oz)    Exam:   General:  Pt in NAD, Alert and Awake  Cardiovascular: RRR, no MRG  Respiratory: CTA BL, no wheezes  Abdomen: soft, NT, ND  Musculoskeletal: no cyanosis or clubbing, Picc line in place at LUE  Data Reviewed: Basic Metabolic Panel:  Recent Labs Lab 06/25/13 1811 06/26/13 0100  06/27/13 1140 06/28/13 0330 06/29/13 0425 06/30/13 0148 07/01/13 1045  NA 131* 134*  < > 130* 131* 132* 129* 129*  K 3.8 3.7  < > 3.9 3.8 3.7 4.2 3.6  CL 97 104  < > 100 100 100 98 97  CO2 21 22  < > 22 21 22 20 23   GLUCOSE 307* 167*  < > 172* 147* 167* 232* 263*  BUN 32* 25*  < > 23  22 20 18 17   CREATININE 1.64* 1.42*  < > 1.02 0.94 0.91 0.73 0.74  CALCIUM 9.9 8.5  < > 8.1* 8.2* 8.3* 7.9* 7.5*  MG  --  1.7  --   --   --   --   --   --   < > = values in this interval not displayed. Liver Function Tests:  Recent Labs Lab 06/27/13 0200  AST 64*  ALT 33  ALKPHOS 64  BILITOT 0.5  PROT 5.7*  ALBUMIN 2.0*   No results found for this basename: LIPASE, AMYLASE,  in the last 168 hours No results found for this basename: AMMONIA,  in the last 168 hours CBC:  Recent Labs Lab 06/27/13 0200 06/28/13 0330 06/29/13 0425 06/30/13 0148 07/01/13 1045  WBC 10.3 11.4* 14.7* 26.0* 22.3*  HGB 10.5* 9.5* 8.9* 9.5* 8.6*  HCT 31.9* 28.4* 26.6* 28.1* 25.3*  MCV 79.4 78.9 79.2 79.8 79.3  PLT  201 242 297 463* 516*   Cardiac Enzymes:  Recent Labs Lab 06/26/13 0100 06/26/13 0525 06/26/13 1135  TROPONINI 12.42* 10.97* 15.24*   BNP (last 3 results) No results found for this basename: PROBNP,  in the last 8760 hours CBG:  Recent Labs Lab 07/01/13 1129 07/01/13 1658 07/01/13 2108 07/02/13 0816 07/02/13 1211  GLUCAP 267* 185* 149* 188* 228*    Recent Results (from the past 240 hour(s))  CULTURE, BLOOD (ROUTINE X 2)     Status: None   Collection Time    06/26/13  1:00 AM      Result Value Range Status   Specimen Description BLOOD RIGHT ARM   Final   Special Requests     Final   Value: BOTTLES DRAWN AEROBIC AND ANAEROBIC  ,   Culture  Setup Time     Final   Value: 06/26/2013 04:11     Performed at [facility]   Culture     Final   Value: STAPHYLOCOCCUS AUREUS     Note: SUSCEPTIBILITIES PERFORMED ON PREVIOUS CULTURE WITHIN THE LAST 5 DAYS.     Note: Gram Stain Report Called to,Read Back By and Verified With: STACY PHELPS 06/28/13 1155 BY SMITHERSJ     Performed at [facility]   Report Status 06/30/2013 FINAL   Final  CULTURE, BLOOD (ROUTINE X 2)     Status: None   Collection Time    06/26/13  1:08 AM      Result Value Range Status   Specimen Description BLOOD RIGHT ARM   Final   Special Requests BOTTLES DRAWN AEROBIC AND ANAEROBIC    Final   Culture  Setup Time     Final   Value: 06/26/2013 04:10     Performed at [facility]   Culture     Final   Value: STAPHYLOCOCCUS AUREUS     Note: RIFAMPIN AND GENTAMICIN SHOULD NOT BE USED AS SINGLE DRUGS FOR TREATMENT OF STAPH INFECTIONS.     Note: Gram Stain Report Called to,Read Back By and Verified With: JOHN HUNT ON 06/28/2013 AT 8:21P BY Serafina Mitchell     Performed at [facility]   Report Status 06/30/2013 FINAL   Final   Organism ID, Bacteria STAPHYLOCOCCUS AUREUS   Final  MRSA PCR SCREENING     Status: None   Collection Time    06/26/13  8:08 AM      Result Value  Range Status   MRSA by PCR NEGATIVE  NEGATIVE Final  Comment:            The GeneXpert MRSA Assay (FDA     approved for NASAL specimens     only), is one component of a     comprehensive MRSA colonization     surveillance program. It is not     intended to diagnose MRSA     infection nor to guide or     monitor treatment for     MRSA infections.  URINE CULTURE     Status: None   Collection Time    06/28/13  6:05 PM      Result Value Range Status   Specimen Description URINE, CATHETERIZED   Final   Special Requests Normal   Final   Culture  Setup Time     Final   Value: 06/28/2013 18:30     Performed at [facility] Count     Final   Value: NO GROWTH     Performed at [facility]   Culture     Final   Value: NO GROWTH     Performed at [facility]   Report Status 06/30/2013 FINAL   Final  CULTURE, BLOOD (ROUTINE X 2)     Status: None   Collection Time    06/29/13  3:10 PM      Result Value Range Status   Specimen Description BLOOD RIGHT ANTECUBITAL   Final   Special Requests BOTTLES DRAWN AEROBIC AND ANAEROBIC 10CC   Final   Culture  Setup Time     Final   Value: 06/29/2013 21:30     Performed at [facility]   Culture     Final   Value:        BLOOD CULTURE RECEIVED NO GROWTH TO DATE CULTURE WILL BE HELD FOR 5 DAYS BEFORE ISSUING A FINAL NEGATIVE REPORT     Performed at [facility]   Report Status PENDING   Incomplete  CULTURE, BLOOD (ROUTINE X 2)     Status: None   Collection Time    06/29/13  3:20 PM      Result Value Range Status   Specimen Description BLOOD RIGHT HAND   Final   Special Requests BOTTLES DRAWN AEROBIC AND ANAEROBIC 10CC   Final   Culture  Setup Time     Final   Value: 06/29/2013 21:31     Performed at [facility]   Culture     Final   Value:        BLOOD CULTURE RECEIVED NO GROWTH TO DATE CULTURE WILL BE HELD FOR 5 DAYS BEFORE ISSUING A FINAL NEGATIVE REPORT     Performed at [facility]   Report Status PENDING   Incomplete     Studies: No results found.  Scheduled Meds: . aspirin  81 mg Oral Daily  .  ceFAZolin (ANCEF) IV  2 g Intravenous Q8H  . docusate sodium  100 mg Oral BID  . insulin aspart  0-15 Units Subcutaneous TID WC  . insulin aspart  0-5 Units Subcutaneous QHS  . insulin aspart  6 Units Subcutaneous TID WC  . insulin detemir  23 Units Subcutaneous Daily  . lisinopril  5 mg Oral Daily  . metoprolol tartrate  50 mg Oral BID  . pantoprazole  40 mg Oral Daily  . pregabalin  75 mg Oral BID  . simvastatin  40 mg Oral QPM   Continuous Infusions: . sodium  chloride 10 mL/hr (07/01/13 0401)    Principal Problem:   Foot abscess, right Active Problems:   Hypercholesteremia   Diabetes mellitus out of control   Essential hypertension, benign   Atherosclerotic peripheral vascular disease   AKI (acute kidney injury)   Sepsis   Dehydration with hyponatremia   Leukocytosis, unspecified   Sepsis(995.91)   SEMI (subendocardial myocardial infarction)   Coronary atherosclerosis of native coronary artery    Time spent: > 35 minutes    Penny Pia  [facility] Pager [phone]. If 7PM-7AM, please contact night-coverage at [email or web address], password [facility] 07/02/2013, 2:52 PM  LOS: 7 days

## 2013-07-02 NOTE — Progress Notes (Signed)
Rehab admissions - Evaluated for possible admission.  I spoke with patient.  He would like inpatient rehab admission.  Awaiting OT consult completion prior to requesting inpatient rehab admission from [facility] insurance carrier.  Also will await completion of TEE and results prior to inpatient rehab.  Need all work up completed prior to inpatient rehab.  Call me for questions.  #[phone]

## 2013-07-02 NOTE — Progress Notes (Signed)
INFECTIOUS DISEASE PROGRESS NOTE  ID: Albert [name] is a 52 y.o. male with  Principal Problem:   Foot abscess, right Active Problems:   Hypercholesteremia   Diabetes mellitus out of control   Essential hypertension, benign   Atherosclerotic peripheral vascular disease   AKI (acute kidney injury)   Sepsis   Dehydration with hyponatremia   Leukocytosis, unspecified   Sepsis(995.91)   SEMI (subendocardial myocardial infarction)   Coronary atherosclerosis of native coronary artery  Subjective: Without complaints  Abtx:  Anti-infectives   Start     Dose/Rate Route Frequency Ordered Stop   06/30/13 1400  ceFAZolin (ANCEF) IVPB 2 g/50 mL premix  Status:  Discontinued     2 g 100 mL/hr over 30 Minutes Intravenous 3 times per day 06/30/13 1015 06/30/13 1017   06/30/13 1100  ceFAZolin (ANCEF) IVPB 2 g/50 mL premix     2 g 100 mL/hr over 30 Minutes Intravenous 3 times per day 06/30/13 1017     06/29/13 2000  vancomycin (VANCOCIN) 1,750 mg in sodium chloride 0.9 % 500 mL IVPB  Status:  Discontinued     1,750 mg 250 mL/hr over 120 Minutes Intravenous Every 12 hours 06/29/13 1343 06/30/13 1015   06/29/13 1400  ceFAZolin (ANCEF) IVPB 2 g/50 mL premix  Status:  Discontinued     2 g 100 mL/hr over 30 Minutes Intravenous 3 times per day 06/29/13 1018 06/29/13 1024   06/29/13 1400  ceFAZolin (ANCEF) IVPB 2 g/50 mL premix     2 g 100 mL/hr over 30 Minutes Intravenous 3 times per day 06/29/13 1343 06/29/13 1437   06/27/13 1000  vancomycin (VANCOCIN) 1,500 mg in sodium chloride 0.9 % 500 mL IVPB  Status:  Discontinued     1,500 mg 250 mL/hr over 120 Minutes Intravenous Every 12 hours 06/27/13 0740 06/29/13 1343   06/26/13 1400  vancomycin (VANCOCIN) 1,500 mg in sodium chloride 0.9 % 500 mL IVPB  Status:  Discontinued     1,500 mg 250 mL/hr over 120 Minutes Intravenous Every 24 hours 06/25/13 1601 06/27/13 0740   06/26/13 0000  clindamycin (CLEOCIN) IVPB 900 mg  Status:  Discontinued     900 mg 100 mL/hr over 30 Minutes Intravenous 4 times per day 06/25/13 2327 06/29/13 1018   06/25/13 2118  polymyxin B 500,000 Units, bacitracin 50,000 Units in sodium chloride irrigation 0.9 % 500 mL irrigation  Status:  Discontinued       As needed 06/25/13 2120 06/25/13 2141   06/25/13 2000  piperacillin-tazobactam (ZOSYN) IVPB 3.375 g  Status:  Discontinued     3.375 g 12.5 mL/hr over 240 Minutes Intravenous Every 8 hours 06/25/13 1600 06/29/13 1018   06/25/13 1600  vancomycin (VANCOCIN) 500 mg in sodium chloride 0.9 % 100 mL IVPB     500 mg 100 mL/hr over 60 Minutes Intravenous STAT 06/25/13 1558 06/25/13 1815   06/25/13 1130  piperacillin-tazobactam (ZOSYN) IVPB 3.375 g     3.375 g 100 mL/hr over 30 Minutes Intravenous STAT 06/25/13 1048 06/25/13 1301   06/25/13 1100  vancomycin (VANCOCIN) IVPB 1000 mg/200 mL premix     1,000 mg 200 mL/hr over 60 Minutes Intravenous STAT 06/25/13 1048 06/25/13 1402      Medications:  Scheduled: . aspirin  81 mg Oral Daily  .  ceFAZolin (ANCEF) IV  2 g Intravenous Q8H  . docusate sodium  100 mg Oral BID  . insulin aspart  0-15 Units Subcutaneous TID WC  . insulin  aspart  0-5 Units Subcutaneous QHS  . insulin aspart  6 Units Subcutaneous TID WC  . insulin detemir  23 Units Subcutaneous Daily  . lisinopril  5 mg Oral Daily  . metoprolol tartrate  50 mg Oral BID  . pantoprazole  40 mg Oral Daily  . pregabalin  75 mg Oral BID  . simvastatin  40 mg Oral QPM    Objective: Vital signs in last 24 hours: Temp:  [97.9 F (36.6 C)-98.7 F (37.1 C)] 98.7 F (37.1 C) (09/16 1421) Pulse Rate:  [75-110] 110 (09/16 1421) Resp:  [18-20] 20 (09/16 1421) BP: (109-142)/(64-99) 124/74 mmHg (09/16 1421) SpO2:  [94 %-98 %] 96 % (09/16 1421) Weight:  [121 kg (266 lb 12.1 oz)] 121 kg (266 lb 12.1 oz) (09/15 2011)   General appearance: alert, cooperative and no distress Resp: clear to auscultation bilaterally Cardio: regular rate and rhythm GI: normal  findings: bowel sounds normal and soft, non-tender Extremities: R stump, wrapped. L stump with Os foul smell. no d/c. non-tender.   Lab Results  Recent Labs  06/30/13 0148 07/01/13 1045  WBC 26.0* 22.3*  HGB 9.5* 8.6*  HCT 28.1* 25.3*  NA 129* 129*  K 4.2 3.6  CL 98 97  CO2 20 23  BUN 18 17  CREATININE 0.73 0.74   Liver Panel No results found for this basename: PROT, ALBUMIN, AST, ALT, ALKPHOS, BILITOT, BILIDIR, IBILI,  in the last 72 hours Sedimentation Rate No results found for this basename: ESRSEDRATE,  in the last 72 hours C-Reactive Protein No results found for this basename: CRP,  in the last 72 hours  Microbiology: Recent Results (from the past 240 hour(s))  CULTURE, BLOOD (ROUTINE X 2)     Status: None   Collection Time    06/26/13  1:00 AM      Result Value Range Status   Specimen Description BLOOD RIGHT ARM   Final   Special Requests     Final   Value: BOTTLES DRAWN AEROBIC AND ANAEROBIC  ,   Culture  Setup Time     Final   Value: 06/26/2013 04:11     Performed at [facility]   Culture     Final   Value: STAPHYLOCOCCUS AUREUS     Note: SUSCEPTIBILITIES PERFORMED ON PREVIOUS CULTURE WITHIN THE LAST 5 DAYS.     Note: Gram Stain Report Called to,Read Back By and Verified With: STACY PHELPS 06/28/13 1155 BY SMITHERSJ     Performed at [facility]   Report Status 06/30/2013 FINAL   Final  CULTURE, BLOOD (ROUTINE X 2)     Status: None   Collection Time    06/26/13  1:08 AM      Result Value Range Status   Specimen Description BLOOD RIGHT ARM   Final   Special Requests BOTTLES DRAWN AEROBIC AND ANAEROBIC    Final   Culture  Setup Time     Final   Value: 06/26/2013 04:10     Performed at [facility]   Culture     Final   Value: STAPHYLOCOCCUS AUREUS     Note: RIFAMPIN AND GENTAMICIN SHOULD NOT BE USED AS SINGLE DRUGS FOR TREATMENT OF STAPH INFECTIONS.     Note: Gram Stain Report Called to,Read Back By and Verified  With: JOHN HUNT ON 06/28/2013 AT 8:21P BY Serafina Mitchell     Performed at [facility]   Report Status 06/30/2013 FINAL   Final  Organism ID, Bacteria STAPHYLOCOCCUS AUREUS   Final  MRSA PCR SCREENING     Status: None   Collection Time    06/26/13  8:08 AM      Result Value Range Status   MRSA by PCR NEGATIVE  NEGATIVE Final   Comment:            The GeneXpert MRSA Assay (FDA     approved for NASAL specimens     only), is one component of a     comprehensive MRSA colonization     surveillance program. It is not     intended to diagnose MRSA     infection nor to guide or     monitor treatment for     MRSA infections.  URINE CULTURE     Status: None   Collection Time    06/28/13  6:05 PM      Result Value Range Status   Specimen Description URINE, CATHETERIZED   Final   Special Requests Normal   Final   Culture  Setup Time     Final   Value: 06/28/2013 18:30     Performed at [facility] Count     Final   Value: NO GROWTH     Performed at [facility]   Culture     Final   Value: NO GROWTH     Performed at [facility]   Report Status 06/30/2013 FINAL   Final  CULTURE, BLOOD (ROUTINE X 2)     Status: None   Collection Time    06/29/13  3:10 PM      Result Value Range Status   Specimen Description BLOOD RIGHT ANTECUBITAL   Final   Special Requests BOTTLES DRAWN AEROBIC AND ANAEROBIC 10CC   Final   Culture  Setup Time     Final   Value: 06/29/2013 21:30     Performed at [facility]   Culture     Final   Value:        BLOOD CULTURE RECEIVED NO GROWTH TO DATE CULTURE WILL BE HELD FOR 5 DAYS BEFORE ISSUING A FINAL NEGATIVE REPORT     Performed at [facility]   Report Status PENDING   Incomplete  CULTURE, BLOOD (ROUTINE X 2)     Status: None   Collection Time    06/29/13  3:20 PM      Result Value Range Status   Specimen Description BLOOD RIGHT HAND   Final   Special Requests BOTTLES DRAWN AEROBIC AND ANAEROBIC 10CC    Final   Culture  Setup Time     Final   Value: 06/29/2013 21:31     Performed at [facility]   Culture     Final   Value:        BLOOD CULTURE RECEIVED NO GROWTH TO DATE CULTURE WILL BE HELD FOR 5 DAYS BEFORE ISSUING A FINAL NEGATIVE REPORT     Performed at [facility]   Report Status PENDING   Incomplete    Studies/Results: No results found.   Assessment/Plan: Gangrene R foot  AKA RLE 9-13  MSSA bacteremia  NSTEMI  DM   Total days of antibiotics: 7  Ancef 9-14   TEE on 9-17. My  Appreciation to CV  His L foot needs f/u as well, foul smelling.  Repeat BCx are ngtd.           Johny Sax Infectious Diseases (pager)  [phone] [email or web address] 07/02/2013, 4:30 PM  LOS: 7 days

## 2013-07-02 NOTE — Progress Notes (Signed)
Occupational Therapy Evaluation Patient Details Name: Albert [name] MRN: [id number] DOB: 29-Sep-1961 Today's Date: 07/02/2013 Time: 8657-8469 OT Time Calculation (min): 32 min  OT Assessment / Plan / Recommendation History of present illness  52 y.o. 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 Admitted 06/25/2013 with nonhealing ulcer to the right foot and Charcot collapse right foot. Underwent irrigation and debridement of right foot plantar abscess 06/25/2013. Postoperatively with hypotension and tachycardia. There is concern of possible need for CABG. Patient with ongoing ischemic changes right lower extremity and underwent right below-knee amputation 06/29/2013     Clinical Impression   PTA, pt independent with ADL and mobility. Educated pt on proper positioning RLE in terminal extension, in addition to general BUE  AROM. Educated pt on beginning desensitization exercises. Pt very motivated on becoming more independent and returning to home. Rec CIR. Pt will benefit from skilled OT services to facilitate D/C to next venue due to below deficits.     OT Assessment  Patient needs continued OT Services    Follow Up Recommendations  CIR    Barriers to Discharge      Equipment Recommendations  Tub/shower seat;3 in 1 bedside comode;Other (comment) (3 in 1 drop arm wide)    Recommendations for Other Services Rehab consult; Prosthetist visit to begin education  Frequency  Min 3X/week    Precautions / Restrictions Precautions Precautions: Fall Restrictions Other Position/Activity Restrictions: elevate operated limb, do not prop pillows under knee; try to position on LEFT side so that RIGHT hip can be in neutral/extension   Pertinent Vitals/Pain 3. R leg. repositioned    ADL  Grooming: Set up Where Assessed - Grooming: Unsupported sitting Upper Body Bathing: Minimal assistance Where Assessed - Upper Body Bathing: Unsupported sitting Lower Body Bathing: Maximal assistance Where Assessed - Lower Body Bathing: Lean right and/or left Upper Body Dressing: Set up;Supervision/safety Where Assessed - Upper Body Dressing: Unsupported sitting Lower Body Dressing: Maximal assistance Where Assessed - Lower Body Dressing: Lean right and/or left Toilet Transfer: +2 Total assistance (for safety. uses scoot technique) Toilet Transfer: Patient Percentage: 70% (Pt not safe at this time for stand pivot transfer) Toileting - Clothing Manipulation and Hygiene: Minimal assistance Where Assessed - Toileting Clothing Manipulation and Hygiene: Lean right and/or left Transfers/Ambulation Related to ADLs: scoot transfer, +2 for safety ADL Comments: pt has fear of falling    OT Diagnosis: Generalized weakness;Acute pain  OT Problem List: Decreased strength;Decreased range of motion;Decreased activity tolerance;Impaired balance (sitting and/or standing);Decreased safety awareness;Decreased knowledge of use of DME or AE;Decreased knowledge of precautions;Obesity;Impaired sensation;Impaired UE functional use;Pain;Increased edema;Impaired tone;Cardiopulmonary status limiting activity;Decreased coordination OT Treatment Interventions: Self-care/ADL training;Therapeutic exercise;Energy conservation;DME and/or AE instruction;Therapeutic activities;Patient/family education;Balance training   OT Goals(Current goals can be found in the care plan section) Acute Rehab OT Goals Patient Stated Goal: get back home OT Goal Formulation: With patient Time For Goal Achievement: 07/16/13 Potential to Achieve Goals: Good  Visit Information  Last OT Received On: 07/02/13 Assistance Needed: +1 History of Present Illness: s/p R BKA       Prior Functioning     Home Living Family/patient expects to be  discharged to:: Private residence Living Arrangements: Spouse/significant other Available Help at Discharge: Available PRN/intermittently Type of Home: House Home Access: Level entry Home Layout: Two level Alternate Level Stairs-Number of Steps: 1 step between living  areas Alternate Level Stairs-Rails: None Home Equipment: Walker - 2 wheels;Wheelchair - manual (can borrow w/c ) Prior Function Level of Independence: Independent Communication Communication: No difficulties Dominant Hand: Right (but right residual weakness so functionally left handed)         Vision/Perception Vision - History Baseline Vision: Wears glasses only for reading Praxis Praxis: Intact   Cognition  Cognition Arousal/Alertness: Awake/alert Behavior During Therapy: WFL for tasks assessed/performed Overall Cognitive Status: Within Functional Limits for tasks assessed    Extremity/Trunk Assessment Upper Extremity Assessment Upper Extremity Assessment: RUE deficits/detail (shoulder movement. uses as gross assist) RUE Deficits / Details: residual weakness from previous stroke RUE Sensation: decreased light touch;decreased proprioception RUE Coordination: decreased fine motor;decreased gross motor Lower Extremity Assessment RLE Deficits / Details: s/p BKA with intact knee extension to -5 in sitting AROM LLE Deficits / Details: s/p 4 toe amputations (all but Great toe), otherwise intact strength/ROM Cervical / Trunk Assessment Cervical / Trunk Assessment: Normal     Mobility Bed Mobility Bed Mobility: Supine to Sit Supine to Sit: 5: Supervision;HOB elevated;With rails Details for Bed Mobility Assistance: standby assist for safety Transfers Details for Transfer Assistance: scoot technique with drop arm recliner - mod A      Exercise Amputee Exercises Quad Sets: AROM;Right;10 reps;Seated Knee Extension: AROM;Right;10 reps;Seated Other Exercises Other Exercises: BUE general AROM Other Exercises:  desensitization techniques Other Exercises: proper positionoing   Balance Balance Balance Assessed: Yes Dynamic Sitting Balance Dynamic Sitting - Balance Activities:  (excellent balance with left foot supported to shift and scoo)   End of Session OT - End of Session Equipment Utilized During Treatment: Gait belt;Rolling walker Activity Tolerance: Patient tolerated treatment well Patient left: in bed;with call bell/phone within reach;with bed alarm set Nurse Communication: Mobility status;Precautions  GO     Sujay Grundman,HILLARY 07/02/2013, 6:21 PM [facility], OTR/L  [phone] 07/02/2013

## 2013-07-02 NOTE — Progress Notes (Signed)
Peripherally Inserted Central Catheter/Midline Placement  The IV Nurse has discussed with the patient and/or persons authorized to consent for the patient, the purpose of this procedure and the potential benefits and risks involved with this procedure.  The benefits include less needle sticks, lab draws from the catheter and patient may be discharged home with the catheter.  Risks include, but not limited to, infection, bleeding, blood clot (thrombus formation), and puncture of an artery; nerve damage and irregular heat beat.  Alternatives to this procedure were also discussed.  PICC/Midline Placement Documentation        Albert [name] 07/02/2013, 11:59 AM

## 2013-07-02 NOTE — Progress Notes (Signed)
Asked to see patient re: possible future CABG'  52 yo admitted with sepsis due to gangrenous right foot. He had a transtibial amputation of the right leg during this admission. While in hospital he ruled in for NQWMI. Had cath last week which showed severe 3 vessel CAD. EF was 40%.  He is scheduled for a TEE tomorrow  He would likely benefit from CABG after he has fully recovered and rehabilitated from current illness/ amputation.  Conduit availability may be an issue given BKA on right and prior bypass below knee on left.  I will have my office arrange an appointment for him in my office in 6-8 weeks to assess how close he is to being a candidate for CABG. Will do full consult at that time.

## 2013-07-02 NOTE — Clinical Social Work Placement (Signed)
Clinical Social Work Department CLINICAL SOCIAL WORK PLACEMENT NOTE 07/02/2013  Patient:  Albert [name], Albert [name]  Account Number:  [id number] Admit date:  06/25/2013  Clinical Social Worker:  Cherre Blanc, [place]  Date/time:  07/02/2013 01:23 PM  Clinical Social Work is seeking post-discharge placement for this patient at the following level of care:   SKILLED NURSING   (*CSW will update this form in Epic as items are completed)   07/02/2013  Patient/family provided with [facility] Department of Clinical Social Work's list of facilities offering this level of care within the geographic area requested by the patient (or if unable, by the patient's family).  07/02/2013  Patient/family informed of their freedom to choose among providers that offer the needed level of care, that participate in Medicare, Medicaid or managed care program needed by the patient, have an available bed and are willing to accept the patient.  07/02/2013  Patient/family informed of [facility]' ownership interest in [facility], as well as of the fact that they are under no obligation to receive care at this facility.  PASARR submitted to EDS on 07/02/2013 PASARR number received from EDS on 07/02/2013  FL2 transmitted to all facilities in geographic area requested by pt/family on  07/02/2013 FL2 transmitted to all facilities within larger geographic area on   Patient informed that his/her managed care company has contracts with or will negotiate with  certain facilities, including the following:     Patient/family informed of bed offers received:   Patient chooses bed at  Physician recommends and patient chooses bed at    Patient to be transferred to  on   Patient to be transferred to facility by   The following physician request were entered in Epic:   Additional Comments:   Roddie Mc, [place], [place], [phone]

## 2013-07-02 NOTE — Clinical Social Work Psychosocial (Signed)
Clinical Social Work Department BRIEF PSYCHOSOCIAL ASSESSMENT 07/02/2013  Patient:  Albert [name], Albert [name]     Account Number:  [id number]     Admit date:  06/25/2013  Clinical Social Worker:  Lavell Luster  Date/Time:  07/02/2013 01:13 PM  Referred by:  Physician  Date Referred:  07/02/2013 Referred for  SNF Placement   Other Referral:   Interview type:  Patient Other interview type:    PSYCHOSOCIAL DATA Living Status:  FAMILY Admitted from facility:   Level of care:   Primary support name:  Anguel Delapena [phone] Primary support relationship to patient:  SPOUSE Degree of support available:   Support is strong. Patient has wife and two children.    CURRENT CONCERNS Current Concerns  Post-Acute Placement   Other Concerns:    SOCIAL WORK ASSESSMENT / PLAN Patient alert and oriented x4. CSW met with patient at bedside to dicuss recommendation for SNF as backup to CIR. Patient from home in [place] with wife [name] and two children (14 and 20). Patient is willing to look at SNF options in [facility] and [facility]. Patient has no previous SNF experience.   Assessment/plan status:  Psychosocial Support/Ongoing Assessment of Needs Other assessment/ plan:   Complete FL2, PASSR Screening, Fax out   Information/referral to community resources:   SNF list and CSW contact information left with patient.    PATIENT'S/FAMILY'S RESPONSE TO PLAN OF CARE: Patient is hopefull for CIR placement, but is willing to look at SNF options in [place] and [facility]. Patient was pleasant and appreciative of CSW visit. Patient awaits bed offers.     Roddie Mc, [place], [place], [phone]

## 2013-07-03 ENCOUNTER — Encounter (HOSPITAL_COMMUNITY)
Admission: EM | Disposition: A | Discharge status: Rehabilitation Facility | Payer: 59 | Source: Home / Self Care | Attending: Internal Medicine

## 2013-07-03 ENCOUNTER — Other Ambulatory Visit: Payer: Self-pay

## 2013-07-03 ENCOUNTER — Encounter (HOSPITAL_COMMUNITY): Payer: Self-pay | Admitting: *Deleted

## 2013-07-03 ENCOUNTER — Encounter (HOSPITAL_COMMUNITY): Payer: Self-pay

## 2013-07-03 ENCOUNTER — Inpatient Hospital Stay (HOSPITAL_COMMUNITY): Payer: 59

## 2013-07-03 ENCOUNTER — Inpatient Hospital Stay (HOSPITAL_COMMUNITY)
Admission: AD | Admit: 2013-07-03 | Discharge: 2013-07-14 | DRG: 945 | Disposition: A | Discharge status: Other Acute Inpatient Hospital | Payer: 59 | Source: Intra-hospital | Attending: Physical Medicine & Rehabilitation | Admitting: Physical Medicine & Rehabilitation

## 2013-07-03 DIAGNOSIS — I2589 Other forms of chronic ischemic heart disease: Secondary | ICD-10-CM

## 2013-07-03 DIAGNOSIS — E1165 Type 2 diabetes mellitus with hyperglycemia: Secondary | ICD-10-CM

## 2013-07-03 DIAGNOSIS — I10 Essential (primary) hypertension: Secondary | ICD-10-CM

## 2013-07-03 DIAGNOSIS — E871 Hypo-osmolality and hyponatremia: Secondary | ICD-10-CM

## 2013-07-03 DIAGNOSIS — Z803 Family history of malignant neoplasm of breast: Secondary | ICD-10-CM

## 2013-07-03 DIAGNOSIS — S88119A Complete traumatic amputation at level between knee and ankle, unspecified lower leg, initial encounter: Secondary | ICD-10-CM

## 2013-07-03 DIAGNOSIS — E1159 Type 2 diabetes mellitus with other circulatory complications: Secondary | ICD-10-CM

## 2013-07-03 DIAGNOSIS — Y835 Amputation of limb(s) as the cause of abnormal reaction of the patient, or of later complication, without mention of misadventure at the time of the procedure: Secondary | ICD-10-CM

## 2013-07-03 DIAGNOSIS — Z89519 Acquired absence of unspecified leg below knee: Secondary | ICD-10-CM

## 2013-07-03 DIAGNOSIS — Y9210 Unspecified residential institution as the place of occurrence of the external cause: Secondary | ICD-10-CM

## 2013-07-03 DIAGNOSIS — R296 Repeated falls: Secondary | ICD-10-CM

## 2013-07-03 DIAGNOSIS — L02619 Cutaneous abscess of unspecified foot: Secondary | ICD-10-CM

## 2013-07-03 DIAGNOSIS — R5381 Other malaise: Secondary | ICD-10-CM

## 2013-07-03 DIAGNOSIS — I6529 Occlusion and stenosis of unspecified carotid artery: Secondary | ICD-10-CM | POA: Diagnosis present

## 2013-07-03 DIAGNOSIS — Z7982 Long term (current) use of aspirin: Secondary | ICD-10-CM

## 2013-07-03 DIAGNOSIS — E1142 Type 2 diabetes mellitus with diabetic polyneuropathy: Secondary | ICD-10-CM

## 2013-07-03 DIAGNOSIS — S98139A Complete traumatic amputation of one unspecified lesser toe, initial encounter: Secondary | ICD-10-CM

## 2013-07-03 DIAGNOSIS — A419 Sepsis, unspecified organism: Secondary | ICD-10-CM

## 2013-07-03 DIAGNOSIS — R0902 Hypoxemia: Secondary | ICD-10-CM

## 2013-07-03 DIAGNOSIS — A4101 Sepsis due to Methicillin susceptible Staphylococcus aureus: Secondary | ICD-10-CM

## 2013-07-03 DIAGNOSIS — E1149 Type 2 diabetes mellitus with other diabetic neurological complication: Secondary | ICD-10-CM

## 2013-07-03 DIAGNOSIS — N179 Acute kidney failure, unspecified: Secondary | ICD-10-CM | POA: Diagnosis present

## 2013-07-03 DIAGNOSIS — Z6837 Body mass index (BMI) 37.0-37.9, adult: Secondary | ICD-10-CM

## 2013-07-03 DIAGNOSIS — T8789 Other complications of amputation stump: Secondary | ICD-10-CM

## 2013-07-03 DIAGNOSIS — E7800 Pure hypercholesterolemia, unspecified: Secondary | ICD-10-CM

## 2013-07-03 DIAGNOSIS — D62 Acute posthemorrhagic anemia: Secondary | ICD-10-CM

## 2013-07-03 DIAGNOSIS — I69998 Other sequelae following unspecified cerebrovascular disease: Secondary | ICD-10-CM

## 2013-07-03 DIAGNOSIS — I70269 Atherosclerosis of native arteries of extremities with gangrene, unspecified extremity: Secondary | ICD-10-CM

## 2013-07-03 DIAGNOSIS — Z8249 Family history of ischemic heart disease and other diseases of the circulatory system: Secondary | ICD-10-CM

## 2013-07-03 DIAGNOSIS — I5021 Acute systolic (congestive) heart failure: Secondary | ICD-10-CM | POA: Diagnosis not present

## 2013-07-03 DIAGNOSIS — I214 Non-ST elevation (NSTEMI) myocardial infarction: Secondary | ICD-10-CM

## 2013-07-03 DIAGNOSIS — I959 Hypotension, unspecified: Secondary | ICD-10-CM | POA: Diagnosis present

## 2013-07-03 DIAGNOSIS — J810 Acute pulmonary edema: Secondary | ICD-10-CM | POA: Diagnosis present

## 2013-07-03 DIAGNOSIS — Z87891 Personal history of nicotine dependence: Secondary | ICD-10-CM

## 2013-07-03 DIAGNOSIS — Z818 Family history of other mental and behavioral disorders: Secondary | ICD-10-CM

## 2013-07-03 DIAGNOSIS — L97509 Non-pressure chronic ulcer of other part of unspecified foot with unspecified severity: Secondary | ICD-10-CM

## 2013-07-03 DIAGNOSIS — R29898 Other symptoms and signs involving the musculoskeletal system: Secondary | ICD-10-CM

## 2013-07-03 DIAGNOSIS — K5900 Constipation, unspecified: Secondary | ICD-10-CM

## 2013-07-03 DIAGNOSIS — R7881 Bacteremia: Secondary | ICD-10-CM

## 2013-07-03 DIAGNOSIS — F4323 Adjustment disorder with mixed anxiety and depressed mood: Secondary | ICD-10-CM

## 2013-07-03 DIAGNOSIS — Z833 Family history of diabetes mellitus: Secondary | ICD-10-CM

## 2013-07-03 DIAGNOSIS — E669 Obesity, unspecified: Secondary | ICD-10-CM

## 2013-07-03 DIAGNOSIS — I2510 Atherosclerotic heart disease of native coronary artery without angina pectoris: Secondary | ICD-10-CM | POA: Diagnosis present

## 2013-07-03 DIAGNOSIS — I509 Heart failure, unspecified: Secondary | ICD-10-CM

## 2013-07-03 DIAGNOSIS — Z881 Allergy status to other antibiotic agents status: Secondary | ICD-10-CM

## 2013-07-03 DIAGNOSIS — Z800 Family history of malignant neoplasm of digestive organs: Secondary | ICD-10-CM

## 2013-07-03 DIAGNOSIS — I255 Ischemic cardiomyopathy: Secondary | ICD-10-CM | POA: Diagnosis present

## 2013-07-03 DIAGNOSIS — Z5189 Encounter for other specified aftercare: Principal | ICD-10-CM

## 2013-07-03 DIAGNOSIS — J189 Pneumonia, unspecified organism: Secondary | ICD-10-CM | POA: Diagnosis present

## 2013-07-03 HISTORY — PX: TEE WITHOUT CARDIOVERSION: SHX5443

## 2013-07-03 LAB — BASIC METABOLIC PANEL
BUN: 35 mg/dL — ABNORMAL HIGH (ref 6–23)
CO2: 22 mEq/L (ref 19–32)
Chloride: 96 mEq/L (ref 96–112)
Creatinine, Ser: 1.35 mg/dL (ref 0.50–1.35)
Potassium: 3.8 mEq/L (ref 3.5–5.1)

## 2013-07-03 LAB — GLUCOSE, CAPILLARY
Glucose-Capillary: 195 mg/dL — ABNORMAL HIGH (ref 70–99)
Glucose-Capillary: 227 mg/dL — ABNORMAL HIGH (ref 70–99)
Glucose-Capillary: 201 mg/dL — ABNORMAL HIGH (ref 70–99)

## 2013-07-03 LAB — PROTIME-INR: Prothrombin Time: 14.7 seconds (ref 11.6–15.2)

## 2013-07-03 LAB — PULMONARY FUNCTION TEST

## 2013-07-03 SURGERY — ECHOCARDIOGRAM, TRANSESOPHAGEAL
Anesthesia: Moderate Sedation

## 2013-07-03 MED ORDER — METOPROLOL TARTRATE 50 MG PO TABS
50.0000 mg | ORAL_TABLET | Freq: Two times a day (BID) | ORAL | Status: DC
  Administered 2013-07-03 – 2013-07-12 (×18): 50 mg via ORAL
  Filled 2013-07-03 (×20): qty 1

## 2013-07-03 MED ORDER — ONDANSETRON HCL 4 MG PO TABS: 4.0000 mg | ORAL_TABLET | Freq: Four times a day (QID) | ORAL | Status: DC | PRN

## 2013-07-03 MED ORDER — METOCLOPRAMIDE HCL 5 MG PO TABS: 5.0000 mg | ORAL_TABLET | Freq: Three times a day (TID) | ORAL | Status: DC | PRN

## 2013-07-03 MED ORDER — ACETAMINOPHEN 650 MG RE SUPP: 650.0000 mg | Freq: Four times a day (QID) | RECTAL | Status: DC | PRN

## 2013-07-03 MED ORDER — BUTAMBEN-TETRACAINE-BENZOCAINE 2-2-14 % EX AERO
INHALATION_SPRAY | CUTANEOUS | Status: DC | PRN
  Administered 2013-07-03: 2 via TOPICAL

## 2013-07-03 MED ORDER — ONDANSETRON HCL 4 MG/2ML IJ SOLN
4.0000 mg | Freq: Four times a day (QID) | INTRAMUSCULAR | Status: DC | PRN
  Administered 2013-07-14 (×2): 4 mg via INTRAVENOUS
  Filled 2013-07-03 (×2): qty 2

## 2013-07-03 MED ORDER — OXYCODONE HCL 5 MG PO TABS: 5.0000 mg | ORAL_TABLET | ORAL | Status: DC | PRN

## 2013-07-03 MED ORDER — INSULIN ASPART 100 UNIT/ML ~~LOC~~ SOLN: 6.0000 [IU] | Freq: Three times a day (TID) | SUBCUTANEOUS | Status: DC

## 2013-07-03 MED ORDER — ASPIRIN 81 MG PO CHEW
81.0000 mg | CHEWABLE_TABLET | Freq: Every day | ORAL | Status: DC
  Administered 2013-07-04 – 2013-07-14 (×11): 81 mg via ORAL
  Filled 2013-07-03 (×12): qty 1

## 2013-07-03 MED ORDER — DOCUSATE SODIUM 100 MG PO CAPS
100.0000 mg | ORAL_CAPSULE | Freq: Two times a day (BID) | ORAL | Status: DC
  Administered 2013-07-03 – 2013-07-07 (×8): 100 mg via ORAL
  Filled 2013-07-03 (×10): qty 1

## 2013-07-03 MED ORDER — PANTOPRAZOLE SODIUM 40 MG PO TBEC: 40.0000 mg | DELAYED_RELEASE_TABLET | Freq: Every day | ORAL | Status: DC

## 2013-07-03 MED ORDER — OXYCODONE HCL 5 MG PO TABS
5.0000 mg | ORAL_TABLET | ORAL | Status: DC | PRN
  Administered 2013-07-03 – 2013-07-04 (×2): 5 mg via ORAL
  Filled 2013-07-03 (×3): qty 1

## 2013-07-03 MED ORDER — INSULIN ASPART 100 UNIT/ML ~~LOC~~ SOLN
0.0000 [IU] | Freq: Three times a day (TID) | SUBCUTANEOUS | Status: DC
  Administered 2013-07-03 – 2013-07-04 (×2): 5 [IU] via SUBCUTANEOUS
  Administered 2013-07-04: 3 [IU] via SUBCUTANEOUS
  Administered 2013-07-04: 5 [IU] via SUBCUTANEOUS
  Administered 2013-07-05: 8 [IU] via SUBCUTANEOUS
  Administered 2013-07-05 – 2013-07-07 (×6): 5 [IU] via SUBCUTANEOUS
  Administered 2013-07-07: 3 [IU] via SUBCUTANEOUS
  Administered 2013-07-07: 5 [IU] via SUBCUTANEOUS
  Administered 2013-07-08: 3 [IU] via SUBCUTANEOUS
  Administered 2013-07-08: 5 [IU] via SUBCUTANEOUS
  Administered 2013-07-09 – 2013-07-10 (×4): 3 [IU] via SUBCUTANEOUS
  Administered 2013-07-10: 2 [IU] via SUBCUTANEOUS
  Administered 2013-07-11 (×2): 3 [IU] via SUBCUTANEOUS
  Administered 2013-07-11: 5 [IU] via SUBCUTANEOUS
  Administered 2013-07-12: 3 [IU] via SUBCUTANEOUS
  Administered 2013-07-12 – 2013-07-13 (×4): 2 [IU] via SUBCUTANEOUS
  Administered 2013-07-14: 3 [IU] via SUBCUTANEOUS
  Administered 2013-07-14: 8 [IU] via SUBCUTANEOUS
  Administered 2013-07-14: 5 [IU] via SUBCUTANEOUS

## 2013-07-03 MED ORDER — FENTANYL CITRATE 0.05 MG/ML IJ SOLN: 50.0000 ug | INTRAMUSCULAR | Status: DC | PRN

## 2013-07-03 MED ORDER — SIMVASTATIN 40 MG PO TABS
40.0000 mg | ORAL_TABLET | Freq: Every evening | ORAL | Status: DC
  Administered 2013-07-03 – 2013-07-14 (×12): 40 mg via ORAL
  Filled 2013-07-03 (×12): qty 1

## 2013-07-03 MED ORDER — ALBUTEROL SULFATE (5 MG/ML) 0.5% IN NEBU
2.5000 mg | INHALATION_SOLUTION | RESPIRATORY_TRACT | Status: DC | PRN
  Administered 2013-07-11: 2.5 mg via RESPIRATORY_TRACT
  Filled 2013-07-03: qty 0.5

## 2013-07-03 MED ORDER — MIDAZOLAM HCL 5 MG/ML IJ SOLN
INTRAMUSCULAR | Status: AC
  Filled 2013-07-03: qty 2

## 2013-07-03 MED ORDER — ACETAMINOPHEN 325 MG PO TABS
650.0000 mg | ORAL_TABLET | Freq: Four times a day (QID) | ORAL | Status: DC | PRN
  Administered 2013-07-06 – 2013-07-13 (×3): 650 mg via ORAL
  Filled 2013-07-03 (×4): qty 2

## 2013-07-03 MED ORDER — INSULIN DETEMIR 100 UNIT/ML ~~LOC~~ SOLN
25.0000 [IU] | Freq: Every day | SUBCUTANEOUS | Status: DC
  Administered 2013-07-04 – 2013-07-05 (×2): 25 [IU] via SUBCUTANEOUS
  Filled 2013-07-03 (×2): qty 0.25

## 2013-07-03 MED ORDER — METOCLOPRAMIDE HCL 5 MG/ML IJ SOLN: 5.0000 mg | Freq: Three times a day (TID) | INTRAMUSCULAR | Status: DC | PRN

## 2013-07-03 MED ORDER — INSULIN ASPART 100 UNIT/ML ~~LOC~~ SOLN
6.0000 [IU] | Freq: Three times a day (TID) | SUBCUTANEOUS | Status: DC
  Administered 2013-07-03 – 2013-07-05 (×5): 6 [IU] via SUBCUTANEOUS

## 2013-07-03 MED ORDER — CEFAZOLIN SODIUM-DEXTROSE 2-3 GM-% IV SOLR: 2.0000 g | Freq: Three times a day (TID) | INTRAVENOUS | Status: AC

## 2013-07-03 MED ORDER — LISINOPRIL 5 MG PO TABS: 5.0000 mg | ORAL_TABLET | Freq: Every day | ORAL | Status: DC

## 2013-07-03 MED ORDER — PANTOPRAZOLE SODIUM 40 MG PO TBEC
40.0000 mg | DELAYED_RELEASE_TABLET | Freq: Every day | ORAL | Status: DC
  Administered 2013-07-04 – 2013-07-14 (×11): 40 mg via ORAL
  Filled 2013-07-03 (×13): qty 1

## 2013-07-03 MED ORDER — INSULIN DETEMIR 100 UNIT/ML ~~LOC~~ SOLN: 25.0000 [IU] | Freq: Every day | SUBCUTANEOUS | Status: DC

## 2013-07-03 MED ORDER — INSULIN ASPART 100 UNIT/ML ~~LOC~~ SOLN: 0.0000 [IU] | Freq: Three times a day (TID) | SUBCUTANEOUS | Status: DC

## 2013-07-03 MED ORDER — INSULIN DETEMIR 100 UNIT/ML ~~LOC~~ SOLN: 25.0000 [IU] | Freq: Every evening | SUBCUTANEOUS | Status: DC

## 2013-07-03 MED ORDER — FENTANYL CITRATE 0.05 MG/ML IJ SOLN
INTRAMUSCULAR | Status: DC | PRN
  Administered 2013-07-03 (×2): 25 ug via INTRAVENOUS

## 2013-07-03 MED ORDER — DIPHENHYDRAMINE HCL 50 MG/ML IJ SOLN
INTRAMUSCULAR | Status: AC
Start: 2013-07-03 — End: 2013-07-03
  Filled 2013-07-03: qty 1

## 2013-07-03 MED ORDER — DSS 100 MG PO CAPS: 100.0000 mg | ORAL_CAPSULE | Freq: Two times a day (BID) | ORAL | Status: DC

## 2013-07-03 MED ORDER — CEFAZOLIN SODIUM-DEXTROSE 2-3 GM-% IV SOLR
2.0000 g | Freq: Three times a day (TID) | INTRAVENOUS | Status: DC
  Administered 2013-07-03 – 2013-07-11 (×24): 2 g via INTRAVENOUS
  Filled 2013-07-03 (×30): qty 50

## 2013-07-03 MED ORDER — METOPROLOL TARTRATE 50 MG PO TABS: 50.0000 mg | ORAL_TABLET | Freq: Two times a day (BID) | ORAL | Status: DC

## 2013-07-03 MED ORDER — PREGABALIN 75 MG PO CAPS
75.0000 mg | ORAL_CAPSULE | Freq: Two times a day (BID) | ORAL | Status: DC
  Administered 2013-07-03 – 2013-07-14 (×22): 75 mg via ORAL
  Filled 2013-07-03 (×23): qty 1

## 2013-07-03 MED ORDER — MIDAZOLAM HCL 10 MG/2ML IJ SOLN
INTRAMUSCULAR | Status: DC | PRN
  Administered 2013-07-03 (×2): 2 mg via INTRAVENOUS

## 2013-07-03 MED ORDER — SORBITOL 70 % SOLN
30.0000 mL | Freq: Every day | Status: DC | PRN
  Administered 2013-07-03 – 2013-07-07 (×2): 30 mL via ORAL
  Filled 2013-07-03 (×2): qty 30

## 2013-07-03 MED ORDER — BISACODYL 10 MG RE SUPP
10.0000 mg | Freq: Every day | RECTAL | Status: DC | PRN
  Administered 2013-07-03 – 2013-07-08 (×2): 10 mg via RECTAL
  Filled 2013-07-03 (×2): qty 1

## 2013-07-03 NOTE — CV Procedure (Signed)
Procedure: TEE  Indication: Assess for endocarditis  Sedation: Versed 4 mg IV, Fentanyl 50 mcg  Findings: Please see echo section for full report.  Normal LV size.  There appears to be septal-lateral dyssynchrony.  EF about 45-50% with possible septal hypokinesis.  Normal RV size and systolic function.  Moderate MR.  No valvular vegetation.  PICC line did not have a vegetation.   No complications.   Marca Ancona 07/03/2013 9:42 AM

## 2013-07-03 NOTE — Progress Notes (Signed)
  Echocardiogram Echocardiogram Transesophageal has been performed.  Albert [name], Albert [name] 07/03/2013, 3:50 PM

## 2013-07-03 NOTE — Interval H&P Note (Signed)
History and Physical Interval Note:  07/03/2013 9:19 AM  Albert [name]  has presented today for surgery, with the diagnosis of rule out vegetation  The various methods of treatment have been discussed with the patient and family. After consideration of risks, benefits and other options for treatment, the patient has consented to  Procedure(s): TRANSESOPHAGEAL ECHOCARDIOGRAM (TEE) ([name]) as a surgical intervention .  The patient's history has been reviewed, patient examined, no change in status, stable for surgery.  I have reviewed the patient's chart and labs.  Questions were answered to the patient's satisfaction.     Dalton [employer]

## 2013-07-03 NOTE — Progress Notes (Signed)
Received patient from 5W.  Patient alert and oriented x4, vitals stable.  Complains of pain 4/10 in RLE.  Patient oriented to room and unit.  Will continue to monitor.

## 2013-07-03 NOTE — Progress Notes (Signed)
Patient refusing to have dressing on right BKA removed for assessment stating that it was recently changed.  Patient agrees to have the dressing removed tomorrow so it can be changed.  Will continue to monitor.

## 2013-07-03 NOTE — H&P (View-Only) (Signed)
    SUBJECTIVE: No chest pain or SOB.   BP 121/87  Pulse 101  Temp(Src) 98.2 F (36.8 C) (Oral)  Resp 20  Ht 5' 11" (1.803 m)  Wt 268 lb 15.4 oz (122 kg)  BMI 37.53 kg/m2  SpO2 95%  Intake/Output Summary (Last 24 hours) at 07/03/13 0658 Last data filed at 07/02/13 2135  Gross per 24 hour  Intake    600 ml  Output    600 ml  Net      0 ml    PHYSICAL EXAM General: Well developed, well nourished, in no acute distress. Alert and oriented x 3.  Psych:  Flat affect, responds appropriately Neck: No JVD. No masses noted.  Lungs: Clear bilaterally with no wheezes or rhonci noted.  Heart: RRR with no murmurs noted. Abdomen: Bowel sounds are present. Soft, non-tender.  Extremities: Right BKA, left foot bandage.   LABS: Basic Metabolic Panel:  Recent Labs  07/01/13 1045  NA 129*  K 3.6  CL 97  CO2 23  GLUCOSE 263*  BUN 17  CREATININE 0.74  CALCIUM 7.5*   CBC:  Recent Labs  07/01/13 1045  WBC 22.3*  HGB 8.6*  HCT 25.3*  MCV 79.3  PLT 516*    Current Meds: . aspirin  81 mg Oral Daily  .  ceFAZolin (ANCEF) IV  2 g Intravenous Q8H  . docusate sodium  100 mg Oral BID  . insulin aspart  0-15 Units Subcutaneous TID WC  . insulin aspart  0-5 Units Subcutaneous QHS  . insulin aspart  6 Units Subcutaneous TID WC  . insulin detemir  23 Units Subcutaneous Daily  . lisinopril  5 mg Oral Daily  . metoprolol tartrate  50 mg Oral BID  . pantoprazole  40 mg Oral Daily  . pregabalin  75 mg Oral BID  . simvastatin  40 mg Oral QPM    ASSESSMENT AND PLAN:   1. CAD/NSTEMI: Elevated troponin last week following I & D of necrotic right foot wound. Cardiac cath 06/28/13 with severe three vessel CAD. The best option for revascularization given mult-vessel disease in setting of poorly controlled DM and HTN will be CABG. He has been seen by Dr. Hendrickson with CT surgery and f/u is planned in the office in 6-8 weeks to discuss planning for CABG. Continue medical management of CAD  for now with ASA, statin, beta blocker, Ace-inh.   2. Ischemic cardiomyopathy: Medical management. Coronary revascularization with CABG when recovered from BKA.   3. MSSA bacteremia: Reviewed ID recs for TEE to exclude endocarditis. Will plan TEE this am. He is NPO.  I have reviewed the procedure with the patient and he agrees to proceed. BMET and INR pending this am.    Albert [name] [name],Albert [name]  9/17/20146:58 AM  

## 2013-07-03 NOTE — Progress Notes (Addendum)
[facility] PROGRESS NOTE  Albert [name]:[id number] DOB: [birth year] DOA: 06/25/2013 PCP: [name], MD  Assessment/Plan:  1. R BKA - Recommendations per ortho and cleared from ortho to transition to SNF vs CIR - CIR consulted based on Physical therapy's recommendations. - Currently awaiting reevaluation from physical therapy prior to insurance clearance for CIR  2. NSTEMI post op - Cardiology on board and managing will defer recommendations to them - Plan is to have CT surgery evaluate prior to d/c to discuss planning for CABG given recent h/o severe three vessel CAD on cardiac cath in 06/28/13 - Plan at this juncture is to manage medically: ASA, statin, beta blocker, Ace-inh.   3. MSSA bacteremia - ID on board and managing antibiotics.  Will defer further recommendations to them and once patient ready to go to SNF will place on the antibiotic regimen that they will outline moving forward. - Source suspected to be 2ary to right gangrenous foot  - TEE completed no valvular vegetations reported.  4. DM  - continue diabetic diet - patient is currently on SSI and levemir. Will increase levemir to 25 units qhs - Blood sugars elevated and most likely due to ongoing infection, once infection is ameliorated suspect blood sugars will be easier to control  5. HTN - Addendum: plan will be to continue B blocker and Ace inhibitor as recommended by Cardiology for recent NSTEMI - Will continue to monitor.  6. HPL -stable   - continue statin  Code Status: full Family Communication: no family at beside.  Disposition Plan: Pending continued improvement in condition and once cleared by specialist involved.   Consultants:  ID  Ortho  Cardiology  Procedures: 9/9 R foot XR >>> Extensive soft tissue air consistent with abscess tracking along the volar aspect of the foot somewhat diffusely but concentrated primarily at the level of the metatarsals.  9/9 OR >>> I&D of R foot  abscess  9/10- trop pos, tachy  9/10 Echo >>> Left ventricle showed distal septal hypokinesis, EF low normal or mildly decreased  9/12 cath >>3V CAD, EF 40%, CABG vs med mx  9/13 Rt BKA [place] [place])  Antibiotics:  Ancef  HPI/Subjective: No new complaints. No acute issues reported overnight to me.  Objective: Filed Vitals:   07/03/13 1020  BP: 124/93  Pulse: 116  Temp:   Resp: 17    Intake/Output Summary (Last 24 hours) at 07/03/13 1320 Last data filed at 07/02/13 2135  Gross per 24 hour  Intake      0 ml  Output    600 ml  Net   -600 ml   Filed Weights   07/01/13 2011 07/03/13 0518 07/03/13 0843  Weight: 121 kg (266 lb 12.1 oz) 122 kg (268 lb 15.4 oz) 121.564 kg (268 lb)    Exam:   General:  Pt in NAD, Alert and Awake  Cardiovascular: RRR, no MRG  Respiratory: CTA BL, no wheezes  Abdomen: soft, NT, ND  Musculoskeletal: no cyanosis or clubbing, Picc line in place at LUE  Data Reviewed: Basic Metabolic Panel:  Recent Labs Lab 06/28/13 0330 06/29/13 0425 06/30/13 0148 07/01/13 1045 07/03/13 0625  NA 131* 132* 129* 129* 130*  K 3.8 3.7 4.2 3.6 3.8  CL 100 100 98 97 96  CO2 21 22 20 23 22   GLUCOSE 147* 167* 232* 263* 222*  BUN 22 20 18 17  35*  CREATININE 0.94 0.91 0.73 0.74 1.35  CALCIUM 8.2* 8.3* 7.9* 7.5* 7.7*   Liver Function  Tests:  Recent Labs Lab 06/27/13 0200  AST 64*  ALT 33  ALKPHOS 64  BILITOT 0.5  PROT 5.7*  ALBUMIN 2.0*   No results found for this basename: LIPASE, AMYLASE,  in the last 168 hours No results found for this basename: AMMONIA,  in the last 168 hours CBC:  Recent Labs Lab 06/27/13 0200 06/28/13 0330 06/29/13 0425 06/30/13 0148 07/01/13 1045  WBC 10.3 11.4* 14.7* 26.0* 22.3*  HGB 10.5* 9.5* 8.9* 9.5* 8.6*  HCT 31.9* 28.4* 26.6* 28.1* 25.3*  MCV 79.4 78.9 79.2 79.8 79.3  PLT 201 242 297 463* 516*   Cardiac Enzymes: No results found for this basename: CKTOTAL, CKMB, CKMBINDEX, TROPONINI,  in the last 168  hours BNP (last 3 results) No results found for this basename: PROBNP,  in the last 8760 hours CBG:  Recent Labs Lab 07/02/13 1711 07/02/13 2130 07/03/13 0813 07/03/13 1000 07/03/13 1244  GLUCAP 252* 266* 195* 238* 227*    Recent Results (from the past 240 hour(s))  CULTURE, BLOOD (ROUTINE X 2)     Status: None   Collection Time    06/26/13  1:00 AM      Result Value Range Status   Specimen Description BLOOD RIGHT ARM   Final   Special Requests     Final   Value: BOTTLES DRAWN AEROBIC AND ANAEROBIC  ,   Culture  Setup Time     Final   Value: 06/26/2013 04:11     Performed at [facility]   Culture     Final   Value: STAPHYLOCOCCUS AUREUS     Note: SUSCEPTIBILITIES PERFORMED ON PREVIOUS CULTURE WITHIN THE LAST 5 DAYS.     Note: Gram Stain Report Called to,Read Back By and Verified With: STACY PHELPS 06/28/13 1155 BY SMITHERSJ     Performed at [facility]   Report Status 06/30/2013 FINAL   Final  CULTURE, BLOOD (ROUTINE X 2)     Status: None   Collection Time    06/26/13  1:08 AM      Result Value Range Status   Specimen Description BLOOD RIGHT ARM   Final   Special Requests BOTTLES DRAWN AEROBIC AND ANAEROBIC    Final   Culture  Setup Time     Final   Value: 06/26/2013 04:10     Performed at [facility]   Culture     Final   Value: STAPHYLOCOCCUS AUREUS     Note: RIFAMPIN AND GENTAMICIN SHOULD NOT BE USED AS SINGLE DRUGS FOR TREATMENT OF STAPH INFECTIONS.     Note: Gram Stain Report Called to,Read Back By and Verified With: JOHN HUNT ON 06/28/2013 AT 8:21P BY Serafina Mitchell     Performed at [facility]   Report Status 06/30/2013 FINAL   Final   Organism ID, Bacteria STAPHYLOCOCCUS AUREUS   Final  MRSA PCR SCREENING     Status: None   Collection Time    06/26/13  8:08 AM      Result Value Range Status   MRSA by PCR NEGATIVE  NEGATIVE Final   Comment:            The GeneXpert MRSA Assay (FDA     approved for NASAL  specimens     only), is one component of a     comprehensive MRSA colonization     surveillance program. It is not     intended to diagnose MRSA     infection nor  to guide or     monitor treatment for     MRSA infections.  URINE CULTURE     Status: None   Collection Time    06/28/13  6:05 PM      Result Value Range Status   Specimen Description URINE, CATHETERIZED   Final   Special Requests Normal   Final   Culture  Setup Time     Final   Value: 06/28/2013 18:30     Performed at [facility] Count     Final   Value: NO GROWTH     Performed at [facility]   Culture     Final   Value: NO GROWTH     Performed at [facility]   Report Status 06/30/2013 FINAL   Final  CULTURE, BLOOD (ROUTINE X 2)     Status: None   Collection Time    06/29/13  3:10 PM      Result Value Range Status   Specimen Description BLOOD RIGHT ANTECUBITAL   Final   Special Requests BOTTLES DRAWN AEROBIC AND ANAEROBIC 10CC   Final   Culture  Setup Time     Final   Value: 06/29/2013 21:30     Performed at [facility]   Culture     Final   Value:        BLOOD CULTURE RECEIVED NO GROWTH TO DATE CULTURE WILL BE HELD FOR 5 DAYS BEFORE ISSUING A FINAL NEGATIVE REPORT     Performed at [facility]   Report Status PENDING   Incomplete  CULTURE, BLOOD (ROUTINE X 2)     Status: None   Collection Time    06/29/13  3:20 PM      Result Value Range Status   Specimen Description BLOOD RIGHT HAND   Final   Special Requests BOTTLES DRAWN AEROBIC AND ANAEROBIC 10CC   Final   Culture  Setup Time     Final   Value: 06/29/2013 21:31     Performed at [facility]   Culture     Final   Value:        BLOOD CULTURE RECEIVED NO GROWTH TO DATE CULTURE WILL BE HELD FOR 5 DAYS BEFORE ISSUING A FINAL NEGATIVE REPORT     Performed at [facility]   Report Status PENDING   Incomplete     Studies: No results found.  Scheduled Meds: . aspirin  81 mg Oral Daily   .  ceFAZolin (ANCEF) IV  2 g Intravenous Q8H  . docusate sodium  100 mg Oral BID  . insulin aspart  0-15 Units Subcutaneous TID WC  . insulin aspart  0-5 Units Subcutaneous QHS  . insulin aspart  6 Units Subcutaneous TID WC  . insulin detemir  23 Units Subcutaneous Daily  . lisinopril  5 mg Oral Daily  . metoprolol tartrate  50 mg Oral BID  . pantoprazole  40 mg Oral Daily  . pregabalin  75 mg Oral BID  . simvastatin  40 mg Oral QPM   Continuous Infusions: . sodium chloride 10 mL/hr (07/01/13 0401)  . sodium chloride      Principal Problem:   Foot abscess, right Active Problems:   Hypercholesteremia   Diabetes mellitus out of control   Essential hypertension, benign   Atherosclerotic peripheral vascular disease   AKI (acute kidney injury)   Sepsis   Dehydration with hyponatremia   Leukocytosis, unspecified   Sepsis(995.91)  SEMI (subendocardial myocardial infarction)   Coronary atherosclerosis of native coronary artery   Cardiomyopathy, ischemic    Time spent: > 35 minutes    Penny Pia  [facility] Pager [phone]. If 7PM-7AM, please contact night-coverage at [email or web address], password [facility] 07/03/2013, 1:20 PM  LOS: 8 days

## 2013-07-03 NOTE — Progress Notes (Signed)
INFECTIOUS DISEASE PROGRESS NOTE  ID: Albert [name] is a 52 y.o. male with  Principal Problem:   Foot abscess, right Active Problems:   Hypercholesteremia   Diabetes mellitus out of control   Essential hypertension, benign   Atherosclerotic peripheral vascular disease   AKI (acute kidney injury)   Sepsis   Dehydration with hyponatremia   Leukocytosis, unspecified   Sepsis(995.91)   SEMI (subendocardial myocardial infarction)   Coronary atherosclerosis of native coronary artery   Cardiomyopathy, ischemic  Subjective: Without complaints Abtx:  Anti-infectives   Start     Dose/Rate Route Frequency Ordered Stop   07/03/13 0000  ceFAZolin (ANCEF) 2-3 GM-% SOLR    Comments:  Dispense quantity sufficient for 3 more weeks of antibiotics.   2 g 100 mL/hr over 30 Minutes Intravenous Every 8 hours 07/03/13 1446 07/24/13 2359   06/30/13 1400  ceFAZolin (ANCEF) IVPB 2 g/50 mL premix  Status:  Discontinued     2 g 100 mL/hr over 30 Minutes Intravenous 3 times per day 06/30/13 1015 06/30/13 1017   06/30/13 1100  ceFAZolin (ANCEF) IVPB 2 g/50 mL premix     2 g 100 mL/hr over 30 Minutes Intravenous 3 times per day 06/30/13 1017     06/29/13 2000  vancomycin (VANCOCIN) 1,750 mg in sodium chloride 0.9 % 500 mL IVPB  Status:  Discontinued     1,750 mg 250 mL/hr over 120 Minutes Intravenous Every 12 hours 06/29/13 1343 06/30/13 1015   06/29/13 1400  ceFAZolin (ANCEF) IVPB 2 g/50 mL premix  Status:  Discontinued     2 g 100 mL/hr over 30 Minutes Intravenous 3 times per day 06/29/13 1018 06/29/13 1024   06/29/13 1400  ceFAZolin (ANCEF) IVPB 2 g/50 mL premix     2 g 100 mL/hr over 30 Minutes Intravenous 3 times per day 06/29/13 1343 06/29/13 1437   06/27/13 1000  vancomycin (VANCOCIN) 1,500 mg in sodium chloride 0.9 % 500 mL IVPB  Status:  Discontinued     1,500 mg 250 mL/hr over 120 Minutes Intravenous Every 12 hours 06/27/13 0740 06/29/13 1343   06/26/13 1400  vancomycin (VANCOCIN)  1,500 mg in sodium chloride 0.9 % 500 mL IVPB  Status:  Discontinued     1,500 mg 250 mL/hr over 120 Minutes Intravenous Every 24 hours 06/25/13 1601 06/27/13 0740   06/26/13 0000  clindamycin (CLEOCIN) IVPB 900 mg  Status:  Discontinued     900 mg 100 mL/hr over 30 Minutes Intravenous 4 times per day 06/25/13 2327 06/29/13 1018   06/25/13 2118  polymyxin B 500,000 Units, bacitracin 50,000 Units in sodium chloride irrigation 0.9 % 500 mL irrigation  Status:  Discontinued       As needed 06/25/13 2120 06/25/13 2141   06/25/13 2000  piperacillin-tazobactam (ZOSYN) IVPB 3.375 g  Status:  Discontinued     3.375 g 12.5 mL/hr over 240 Minutes Intravenous Every 8 hours 06/25/13 1600 06/29/13 1018   06/25/13 1600  vancomycin (VANCOCIN) 500 mg in sodium chloride 0.9 % 100 mL IVPB     500 mg 100 mL/hr over 60 Minutes Intravenous STAT 06/25/13 1558 06/25/13 1815   06/25/13 1130  piperacillin-tazobactam (ZOSYN) IVPB 3.375 g     3.375 g 100 mL/hr over 30 Minutes Intravenous STAT 06/25/13 1048 06/25/13 1301   06/25/13 1100  vancomycin (VANCOCIN) IVPB 1000 mg/200 mL premix     1,000 mg 200 mL/hr over 60 Minutes Intravenous STAT 06/25/13 1048 06/25/13 1402  Medications:  Scheduled: . aspirin  81 mg Oral Daily  .  ceFAZolin (ANCEF) IV  2 g Intravenous Q8H  . docusate sodium  100 mg Oral BID  . insulin aspart  0-15 Units Subcutaneous TID WC  . insulin aspart  0-5 Units Subcutaneous QHS  . insulin aspart  6 Units Subcutaneous TID WC  . [START ON 07/04/2013] insulin detemir  25 Units Subcutaneous Daily  . metoprolol tartrate  50 mg Oral BID  . pantoprazole  40 mg Oral Daily  . pregabalin  75 mg Oral BID  . simvastatin  40 mg Oral QPM    Objective: Vital signs in last 24 hours: Temp:  [97.6 F (36.4 C)-98.6 F (37 C)] 97.7 F (36.5 C) (09/17 1418) Pulse Rate:  [12-116] 93 (09/17 1418) Resp:  [13-24] 18 (09/17 1418) BP: (100-214)/(42-100) 100/77 mmHg (09/17 1418) SpO2:  [86 %-99 %] 97 %  (09/17 1418) Weight:  [121.564 kg (268 lb)-122 kg (268 lb 15.4 oz)] 121.564 kg (268 lb) (09/17 0843)   General appearance: alert, cooperative and no distress Extremities: L foot- wound with os. no d/c. macerated. R stump- well healed. mild tenderness. no fluctuance.   Lab Results  Recent Labs  07/01/13 1045 07/03/13 0625  WBC 22.3*  --   HGB 8.6*  --   HCT 25.3*  --   NA 129* 130*  K 3.6 3.8  CL 97 96  CO2 23 22  BUN 17 35*  CREATININE 0.74 1.35   Liver Panel No results found for this basename: PROT, ALBUMIN, AST, ALT, ALKPHOS, BILITOT, BILIDIR, IBILI,  in the last 72 hours Sedimentation Rate No results found for this basename: ESRSEDRATE,  in the last 72 hours C-Reactive Protein No results found for this basename: CRP,  in the last 72 hours  Microbiology: Recent Results (from the past 240 hour(s))  CULTURE, BLOOD (ROUTINE X 2)     Status: None   Collection Time    06/26/13  1:00 AM      Result Value Range Status   Specimen Description BLOOD RIGHT ARM   Final   Special Requests     Final   Value: BOTTLES DRAWN AEROBIC AND ANAEROBIC  ,   Culture  Setup Time     Final   Value: 06/26/2013 04:11     Performed at [facility]   Culture     Final   Value: STAPHYLOCOCCUS AUREUS     Note: SUSCEPTIBILITIES PERFORMED ON PREVIOUS CULTURE WITHIN THE LAST 5 DAYS.     Note: Gram Stain Report Called to,Read Back By and Verified With: STACY PHELPS 06/28/13 1155 BY SMITHERSJ     Performed at [facility]   Report Status 06/30/2013 FINAL   Final  CULTURE, BLOOD (ROUTINE X 2)     Status: None   Collection Time    06/26/13  1:08 AM      Result Value Range Status   Specimen Description BLOOD RIGHT ARM   Final   Special Requests BOTTLES DRAWN AEROBIC AND ANAEROBIC    Final   Culture  Setup Time     Final   Value: 06/26/2013 04:10     Performed at [facility]   Culture     Final   Value: STAPHYLOCOCCUS AUREUS     Note: RIFAMPIN AND  GENTAMICIN SHOULD NOT BE USED AS SINGLE DRUGS FOR TREATMENT OF STAPH INFECTIONS.     Note: Gram Stain Report Called to,Read Back By and  Verified With: JOHN HUNT ON 06/28/2013 AT 8:21P BY Serafina Mitchell     Performed at [facility]   Report Status 06/30/2013 FINAL   Final   Organism ID, Bacteria STAPHYLOCOCCUS AUREUS   Final  MRSA PCR SCREENING     Status: None   Collection Time    06/26/13  8:08 AM      Result Value Range Status   MRSA by PCR NEGATIVE  NEGATIVE Final   Comment:            The GeneXpert MRSA Assay (FDA     approved for NASAL specimens     only), is one component of a     comprehensive MRSA colonization     surveillance program. It is not     intended to diagnose MRSA     infection nor to guide or     monitor treatment for     MRSA infections.  URINE CULTURE     Status: None   Collection Time    06/28/13  6:05 PM      Result Value Range Status   Specimen Description URINE, CATHETERIZED   Final   Special Requests Normal   Final   Culture  Setup Time     Final   Value: 06/28/2013 18:30     Performed at [facility] Count     Final   Value: NO GROWTH     Performed at [facility]   Culture     Final   Value: NO GROWTH     Performed at [facility]   Report Status 06/30/2013 FINAL   Final  CULTURE, BLOOD (ROUTINE X 2)     Status: None   Collection Time    06/29/13  3:10 PM      Result Value Range Status   Specimen Description BLOOD RIGHT ANTECUBITAL   Final   Special Requests BOTTLES DRAWN AEROBIC AND ANAEROBIC 10CC   Final   Culture  Setup Time     Final   Value: 06/29/2013 21:30     Performed at [facility]   Culture     Final   Value:        BLOOD CULTURE RECEIVED NO GROWTH TO DATE CULTURE WILL BE HELD FOR 5 DAYS BEFORE ISSUING A FINAL NEGATIVE REPORT     Performed at [facility]   Report Status PENDING   Incomplete  CULTURE, BLOOD (ROUTINE X 2)     Status: None   Collection Time    06/29/13  3:20 PM       Result Value Range Status   Specimen Description BLOOD RIGHT HAND   Final   Special Requests BOTTLES DRAWN AEROBIC AND ANAEROBIC 10CC   Final   Culture  Setup Time     Final   Value: 06/29/2013 21:31     Performed at [facility]   Culture     Final   Value:        BLOOD CULTURE RECEIVED NO GROWTH TO DATE CULTURE WILL BE HELD FOR 5 DAYS BEFORE ISSUING A FINAL NEGATIVE REPORT     Performed at [facility]   Report Status PENDING   Incomplete    Studies/Results: No results found.   Assessment/Plan: Gangrene R foot  AKA RLE 9-13  MSSA bacteremia  NSTEMI  DM   Total days of antibiotics 8 Would plan on 28 days of total anbx His TEE is (-) for  vegetation but his L foot wound is concerning and will need f/u.  Will have him seen in ID office in 3-4 weeks.           Johny Sax Infectious Diseases (pager) [phone] [email or web address] 07/03/2013, 3:08 PM  LOS: 8 days

## 2013-07-03 NOTE — H&P (View-Only) (Signed)
Physical Medicine and Rehabilitation Admission H&P    Chief Complaint  Patient presents with  . Abscess  : HPI: Albert [name] is a 52 y.o. 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 Admitted 06/25/2013 with nonhealing ulcer to the right foot and Charcot collapse right foot. Underwent irrigation and debridement of right foot plantar abscess 06/25/2013 per Dr. Blackman. Postoperatively with hypotension and tachycardia. Cardiology services consulted with findings of elevated troponin levels of 10. Echocardiogram with distal septal hypokinesis with overall low-normal LVEF. Elevated troponin levels are likely related to underlying sepsis. Underwent cardiac catheterization 06/28/2013 with findings of triple vessel CAD NSTEMI secondary to demand ischemia and mild LV systolic dysfunction. Patient placed on medical management with aspirin therapy. Patient with ongoing ischemic changes right lower extremity and underwent right below-knee amputation 06/29/2013 per Dr. Duda. Cardiothoracic surgery followup to consider CABG after recovery from BKA. TEE completed 07/03/2013 any vegetation that was negative with ejection fraction 45-50% .patient currently remains on Ancef therapy per infectious disease for sepsis /MSSA bacteremia with plan for 28 day duration. Physical therapy evaluation completed 06/30/2013 with recommendations of physical medicine rehabilitation consult to consider inpatient rehabilitation services. Patient was felt to be a good candidate for inpatient rehabilitation services was admitted for comprehensive rehabilitation program today.  Review of Systems  Constitutional: Positive for fever.  Respiratory: Positive for cough.  Cardiovascular: Positive for leg swelling.  Neurological:  Positive for weakness.  All other systems reviewed and are negative   Past Medical History  Diagnosis Date  . Hypertension   . Diabetes mellitus without complication ~2000    last HbA1c ~9  . Hypercholesteremia   . CVA (cerebral infarction) 2011    R hand deficit  . Chronic sinusitis   . Allergic rhinitis   . Chronic cough   . Complication of anesthesia     couldn't swallow and talk  . Stroke    Past Surgical History  Procedure Laterality Date  . 4th toe amputation Left Jan. 2014    4th toe in [place]  . Tonsillectomy and adenoidectomy    . Penile prosthesis implant    . I&d extremity Left 12/08/2012    Procedure: IRRIGATION AND DEBRIDEMENT EXTREMITY;  Surgeon: Christopher Y Blackman, MD;  Location: [facility] OR;  Service: Orthopedics;  Laterality: Left;  . Amputation Left 12/08/2012    Procedure: AMPUTATION DIGIT;  Surgeon: Christopher Y Blackman, MD;  Location: [facility] OR;  Service: Orthopedics;  Laterality: Left;  3rd toe amputation possible 5th toe amputation  . I&d extremity Left 12/11/2012    Procedure: REPEAT I&D LEFT FOOT;  Surgeon: Christopher Y Blackman, MD;  Location: [facility];  Service: Orthopedics;  Laterality: Left;  . Amputation Left 12/11/2012    Procedure: Amputation of 2nd Toe;  Surgeon: Christopher Y Blackman, MD;  Location: [facility];  Service: Orthopedics;  Laterality: Left;  . Bypass graft popliteal to tibial Left 12/13/2012    Procedure: BYPASS GRAFT POPLITEAL TO TIBIAL;  Surgeon: Vance W Brabham, MD;  Location: [facility];  Service: Vascular;  Laterality: Left;  Left Popliteal to Posterior Tibial Bypass Graft with reversed saphenous vein graft  . Incision and drainage Right 06/25/2013    Procedure: INCISION AND DRAINAGE RIGHT FOOT;  Surgeon: Christopher Y Blackman, MD;  Location: [facility];  Service: Orthopedics;  Laterality: Right;  . Amputation Right 06/29/2013      Procedure: AMPUTATION BELOW KNEE;  Surgeon: Marcus V Duda, MD;  Location: [facility];  Service: Orthopedics;  Laterality: Right;    Family History  Problem Relation Age of Onset  . Diabetes    . Pancreatic cancer Mother   . Diabetes Mother   . Hyperlipidemia Mother   . Breast cancer Sister   . Depression Maternal Grandmother   . Heart disease Maternal Grandmother   . Depression Maternal Grandfather   . Heart disease Maternal Grandfather   . Depression Paternal Grandmother   . Heart disease Paternal Grandmother   . Depression Paternal Grandfather   . Heart disease Paternal Grandfather    Social History:  reports that he has quit smoking. He has never used smokeless tobacco. He reports that he does not drink alcohol or use illicit drugs. Allergies:  Allergies  Allergen Reactions  . Amoxicillin     vomiting   Medications Prior to Admission  Medication Sig Dispense Refill  . aspirin 81 MG tablet Take 81 mg by mouth daily.      . glyBURIDE-metformin (GLUCOVANCE) 2.5-500 MG per tablet Take 1 tablet by mouth daily with breakfast.      . insulin aspart (NOVOLOG FLEXPEN) 100 UNIT/ML injection Inject 12 Units into the skin 3 (three) times daily. Take every time he eats per patient      . insulin detemir (LEVEMIR) 100 UNIT/ML injection Inject 20 Units into the skin every evening.      . irbesartan-hydrochlorothiazide (AVALIDE) 300-12.5 MG per tablet Take 1 tablet by mouth daily.      . metoprolol tartrate (LOPRESSOR) 25 MG tablet Take 25 mg by mouth 2 (two) times daily.      . ondansetron (ZOFRAN) 8 MG tablet Take by mouth every 8 (eight) hours as needed for nausea. For upset stomach      . pregabalin (LYRICA) 75 MG capsule Take 75 mg by mouth 2 (two) times daily.      . Rivaroxaban (XARELTO) 20 MG TABS Take 1 tablet (20 mg total) by mouth daily with supper.  30 tablet  2  . silver sulfADIAZINE (SILVADENE) 1 % cream Apply 1 application topically daily.      . simvastatin (ZOCOR) 40 MG tablet Take 40 mg by mouth every evening.        Home: Home Living Family/patient expects to be discharged to:: Private  residence Living Arrangements: Spouse/significant other Available Help at Discharge: Available PRN/intermittently Type of Home: House Home Access: Level entry Home Layout: Two level Alternate Level Stairs-Number of Steps: 1 step between living areas Alternate Level Stairs-Rails: None Home Equipment: Walker - 2 wheels;Wheelchair - manual (can borrow w/c )   Functional History:    Functional Status:  Mobility: Bed Mobility Bed Mobility: Supine to Sit Supine to Sit: 5: Supervision;HOB elevated;With rails Transfers Transfers: Lateral/Scoot Transfers Lateral/Scoot Transfers: 3: Mod assist Ambulation/Gait Ambulation/Gait Assistance: Not tested (comment) Stairs: No Wheelchair Mobility Wheelchair Mobility: No  ADL: ADL Grooming: Set up Where Assessed - Grooming: Unsupported sitting Upper Body Bathing: Minimal assistance Where Assessed - Upper Body Bathing: Unsupported sitting Lower Body Bathing: Maximal assistance Where Assessed - Lower Body Bathing: Lean right and/or left Upper Body Dressing: Set up;Supervision/safety Where Assessed - Upper Body Dressing: Unsupported sitting Lower Body Dressing: Maximal assistance Where Assessed - Lower Body Dressing: Lean right and/or left Toilet Transfer: +2 Total assistance (for safety. uses scoot technique) Transfers/Ambulation Related to ADLs: scoot transfer, +2 for safety ADL Comments: pt has fear of falling  Cognition: Cognition Overall   Cognitive Status: Within Functional Limits for tasks assessed Orientation Level: Oriented X4 Cognition Arousal/Alertness: Awake/alert Behavior During Therapy: WFL for tasks assessed/performed Overall Cognitive Status: Within Functional Limits for tasks assessed  Physical Exam: Blood pressure 124/93, pulse 116, temperature 97.7 F (36.5 C), temperature source Oral, resp. rate 17, height 5' 11" (1.803 m), weight 121.564 kg (268 lb), SpO2 99.00%. Constitutional: He is oriented to person, place, and  time.  HENT:  Head: Normocephalic.dentition fair. Oral mucosa pink and moist  Eyes: EOM are normal.  Neck: Normal range of motion. Neck supple. No thyromegaly present.  Cardiovascular: Normal rate. No murmur or gallop Pulmonary/Chest: Effort normal and breath sounds normal. No respiratory distress. No wheezes Abdominal: Soft. Bowel sounds are normal. He exhibits no distension.  Neurological: He is alert and oriented to person, place, and time. CN grossly intact. DTR's decreased Pt is alert and appropriate. Right deltoid is 3/5. Bicep/tricep 4/5. HI are 3-. LUE is grossly 5/5. LLE is 4-/5 proximal to 3+/4- distal. Sensation grossly intact except for LT distal LLE.  Skin:  Right amputation site is dressed with an immediate post-op compression wrap. There is a heavy dressing also to the left foot without signs of seepage or drainage.  Psychiatric: He has a normal mood and affect. His behavior is normal. Judgment and thought content normal.     Results for orders placed during the hospital encounter of 06/25/13 (from the past 48 hour(s))  GLUCOSE, CAPILLARY     Status: Abnormal   Collection Time    07/01/13  4:58 PM      Result Value Range   Glucose-Capillary 185 (*) 70 - 99 mg/dL   Comment 1 Documented in Chart     Comment 2 Notify RN    GLUCOSE, CAPILLARY     Status: Abnormal   Collection Time    07/01/13  9:08 PM      Result Value Range   Glucose-Capillary 149 (*) 70 - 99 mg/dL   Comment 1 Documented in Chart     Comment 2 Notify RN    GLUCOSE, CAPILLARY     Status: Abnormal   Collection Time    07/02/13  8:16 AM      Result Value Range   Glucose-Capillary 188 (*) 70 - 99 mg/dL   Comment 1 Notify RN    GLUCOSE, CAPILLARY     Status: Abnormal   Collection Time    07/02/13 12:11 PM      Result Value Range   Glucose-Capillary 228 (*) 70 - 99 mg/dL   Comment 1 Notify RN    GLUCOSE, CAPILLARY     Status: Abnormal   Collection Time    07/02/13  5:11 PM      Result Value Range    Glucose-Capillary 252 (*) 70 - 99 mg/dL   Comment 1 Documented in Chart     Comment 2 Notify RN    GLUCOSE, CAPILLARY     Status: Abnormal   Collection Time    07/02/13  9:30 PM      Result Value Range   Glucose-Capillary 266 (*) 70 - 99 mg/dL  BASIC METABOLIC PANEL     Status: Abnormal   Collection Time    07/03/13  6:25 AM      Result Value Range   Sodium 130 (*) 135 - 145 mEq/L   Potassium 3.8  3.5 - 5.1 mEq/L   Chloride 96  96 - 112 mEq/L   CO2 22  19 - 32 mEq/L     Glucose, Bld 222 (*) 70 - 99 mg/dL   BUN 35 (*) 6 - 23 mg/dL   Comment: REPEATED TO VERIFY   Creatinine, Ser 1.35  0.50 - 1.35 mg/dL   Comment: REPEATED TO VERIFY   Calcium 7.7 (*) 8.4 - 10.5 mg/dL   GFR calc non Af Amer 60 (*) >90 mL/min   GFR calc Af Amer 69 (*) >90 mL/min   Comment: (NOTE)     The eGFR has been calculated using the CKD EPI equation.     This calculation has not been validated in all clinical situations.     eGFR's persistently <90 mL/min signify possible Chronic Kidney     Disease.  PROTIME-INR     Status: None   Collection Time    07/03/13  6:25 AM      Result Value Range   Prothrombin Time 14.7  11.6 - 15.2 seconds   INR 1.17  0.00 - 1.49  GLUCOSE, CAPILLARY     Status: Abnormal   Collection Time    07/03/13  8:13 AM      Result Value Range   Glucose-Capillary 195 (*) 70 - 99 mg/dL   Comment 1 Documented in Chart     Comment 2 Notify RN    GLUCOSE, CAPILLARY     Status: Abnormal   Collection Time    07/03/13 10:00 AM      Result Value Range   Glucose-Capillary 238 (*) 70 - 99 mg/dL   Comment 1 Documented in Chart    GLUCOSE, CAPILLARY     Status: Abnormal   Collection Time    07/03/13 12:44 PM      Result Value Range   Glucose-Capillary 227 (*) 70 - 99 mg/dL   Comment 1 Documented in Chart     Comment 2 Notify RN     No results found.  Post Admission Physician Evaluation: 1. Functional deficits secondary  to right BKA in a man with a prior left CVA. 2. Patient is  admitted to receive collaborative, interdisciplinary care between the physiatrist, rehab nursing staff, and therapy team. 3. Patient's level of medical complexity and substantial therapy needs in context of that medical necessity cannot be provided at a lesser intensity of care such as a SNF. 4. Patient has experienced substantial functional loss from his/her baseline which was documented above under the "Functional History" and "Functional Status" headings.  Judging by the patient's diagnosis, physical exam, and functional history, the patient has potential for functional progress which will result in measurable gains while on inpatient rehab.  These gains will be of substantial and practical use upon discharge  in facilitating mobility and self-care at the household level. 5. Physiatrist will provide 24 hour management of medical needs as well as oversight of the therapy plan/treatment and provide guidance as appropriate regarding the interaction of the two. 6. 24 hour rehab nursing will assist with bladder management, bowel management, safety, skin/wound care, disease management, medication administration, pain management and patient education  and help integrate therapy concepts, techniques,education, etc. 7. PT will assess and treat for/with: Lower extremity strength, range of motion, stamina, balance, functional mobility, safety, adaptive techniques and equipment, pain mgt, pre-prosthetic education, skin precautions.   Goals are: supervision to min assist. 8. OT will assess and treat for/with: ADL's, functional mobility, safety, upper extremity strength, adaptive techniques and equipment, NMR, pain mgt, education.   Goals are: minimal to moderate assist. 9. SLP will assess and treat for/with: n/a.  Goals are: n/a.   10. Case Management and Social Worker will assess and treat for psychological issues and discharge planning. 11. Team conference will be held weekly to assess progress toward goals and to  determine barriers to discharge. 12. Patient will receive at least 3 hours of therapy per day at least 5 days per week. 13. ELOS: 2 weeks       14. Prognosis:  excellent   Medical Problem List and Plan: 1. Right BKA due to a Charcot foot/abscess, prior left CVA with residual right-sided weakness 2. DVT Prophylaxis/Anticoagulation: SCDs left lower extremity. 3. Pain Management: Lyrica 75 mg twice a day, Oxycodone as needed. Monitor with increased mobility 4. Neuropsych: This patient is capable of making decisions on his own behalf. 5. CAD/NSTEMI. Continue aspirin therapy. Plan followup cardiothoracic surgery 6-8 weeks to consider CABG. Patient had chest pain or shortness of breath 6. ID. Sepsis/MSSA bacteremia. Continue Ancef as directed x28 days total initiated 06/25/2013. 7. Diabetes mellitus with peripheral neuropathy. Hemoglobin A1c 8.2. Levemir 25 units daily, NovoLog 6 units 3 times a day. Check blood sugars a.c. and at bedtime 8. Hypertension. Lopressor 50 mg twice a day. Monitor with increased mobility 9. Acute blood loss anemia. Latest hemoglobin 10.8. Followup CBC 10. Recent multi-toe amputation left foot. Continue wound care as advised. Monitor for increased ischemic changes. Need to be careful with transfers or any attempts with gait. Followup vascular surgery  Zachary T. Swartz, MD, FAAPMR [facility]  07/03/2013 

## 2013-07-03 NOTE — Progress Notes (Signed)
ANTIBIOTIC CONSULT NOTE - FOLLOW UP  Pharmacy Consult for Cefazolin Indication: MSSA bacteremia  Allergies  Allergen Reactions  . Amoxicillin     vomiting    Patient Measurements: Height: 5\' 11"  (180.3 cm) Weight: 268 lb (121.564 kg) IBW/kg (Calculated) : 75.3  Vital Signs: Temp: 97.7 F (36.5 C) (09/17 0951) Temp src: Oral (09/17 0843) BP: 124/93 mmHg (09/17 1020) Pulse Rate: 116 (09/17 1020) Intake/Output from previous day: 09/16 0701 - 09/17 0700 In: 600 [P.O.:600] Out: 600 [Urine:600] Intake/Output from this shift:    Labs:  Recent Labs  07/01/13 1045 07/03/13 0625  WBC 22.3*  --   HGB 8.6*  --   PLT 516*  --   CREATININE 0.74 1.35   Estimated Creatinine Clearance: 86.9 ml/min (by C-G formula based on Cr of 1.35).    Microbiology: Recent Results (from the past 720 hour(s))  CULTURE, BLOOD (ROUTINE X 2)     Status: None   Collection Time    06/26/13  1:00 AM      Result Value Range Status   Specimen Description BLOOD RIGHT ARM   Final   Special Requests     Final   Value: BOTTLES DRAWN AEROBIC AND ANAEROBIC  ,   Culture  Setup Time     Final   Value: 06/26/2013 04:11     Performed at [facility]   Culture     Final   Value: STAPHYLOCOCCUS AUREUS     Note: SUSCEPTIBILITIES PERFORMED ON PREVIOUS CULTURE WITHIN THE LAST 5 DAYS.     Note: Gram Stain Report Called to,Read Back By and Verified With: STACY PHELPS 06/28/13 1155 BY SMITHERSJ     Performed at [facility]   Report Status 06/30/2013 FINAL   Final  CULTURE, BLOOD (ROUTINE X 2)     Status: None   Collection Time    06/26/13  1:08 AM      Result Value Range Status   Specimen Description BLOOD RIGHT ARM   Final   Special Requests BOTTLES DRAWN AEROBIC AND ANAEROBIC    Final   Culture  Setup Time     Final   Value: 06/26/2013 04:10     Performed at [facility]   Culture     Final   Value: STAPHYLOCOCCUS AUREUS     Note: RIFAMPIN AND GENTAMICIN  SHOULD NOT BE USED AS SINGLE DRUGS FOR TREATMENT OF STAPH INFECTIONS.     Note: Gram Stain Report Called to,Read Back By and Verified With: JOHN HUNT ON 06/28/2013 AT 8:21P BY Serafina Mitchell     Performed at [facility]   Report Status 06/30/2013 FINAL   Final   Organism ID, Bacteria STAPHYLOCOCCUS AUREUS   Final  MRSA PCR SCREENING     Status: None   Collection Time    06/26/13  8:08 AM      Result Value Range Status   MRSA by PCR NEGATIVE  NEGATIVE Final   Comment:            The GeneXpert MRSA Assay (FDA     approved for NASAL specimens     only), is one component of a     comprehensive MRSA colonization     surveillance program. It is not     intended to diagnose MRSA     infection nor to guide or     monitor treatment for     MRSA infections.  URINE CULTURE     Status:  None   Collection Time    06/28/13  6:05 PM      Result Value Range Status   Specimen Description URINE, CATHETERIZED   Final   Special Requests Normal   Final   Culture  Setup Time     Final   Value: 06/28/2013 18:30     Performed at [facility] Count     Final   Value: NO GROWTH     Performed at [facility]   Culture     Final   Value: NO GROWTH     Performed at [facility]   Report Status 06/30/2013 FINAL   Final  CULTURE, BLOOD (ROUTINE X 2)     Status: None   Collection Time    06/29/13  3:10 PM      Result Value Range Status   Specimen Description BLOOD RIGHT ANTECUBITAL   Final   Special Requests BOTTLES DRAWN AEROBIC AND ANAEROBIC 10CC   Final   Culture  Setup Time     Final   Value: 06/29/2013 21:30     Performed at [facility]   Culture     Final   Value:        BLOOD CULTURE RECEIVED NO GROWTH TO DATE CULTURE WILL BE HELD FOR 5 DAYS BEFORE ISSUING A FINAL NEGATIVE REPORT     Performed at [facility]   Report Status PENDING   Incomplete  CULTURE, BLOOD (ROUTINE X 2)     Status: None   Collection Time    06/29/13  3:20 PM      Result  Value Range Status   Specimen Description BLOOD RIGHT HAND   Final   Special Requests BOTTLES DRAWN AEROBIC AND ANAEROBIC 10CC   Final   Culture  Setup Time     Final   Value: 06/29/2013 21:31     Performed at [facility]   Culture     Final   Value:        BLOOD CULTURE RECEIVED NO GROWTH TO DATE CULTURE WILL BE HELD FOR 5 DAYS BEFORE ISSUING A FINAL NEGATIVE REPORT     Performed at [facility]   Report Status PENDING   Incomplete    Anti-infectives   Start     Dose/Rate Route Frequency Ordered Stop   06/30/13 1400  ceFAZolin (ANCEF) IVPB 2 g/50 mL premix  Status:  Discontinued     2 g 100 mL/hr over 30 Minutes Intravenous 3 times per day 06/30/13 1015 06/30/13 1017   06/30/13 1100  ceFAZolin (ANCEF) IVPB 2 g/50 mL premix     2 g 100 mL/hr over 30 Minutes Intravenous 3 times per day 06/30/13 1017     06/29/13 2000  vancomycin (VANCOCIN) 1,750 mg in sodium chloride 0.9 % 500 mL IVPB  Status:  Discontinued     1,750 mg 250 mL/hr over 120 Minutes Intravenous Every 12 hours 06/29/13 1343 06/30/13 1015   06/29/13 1400  ceFAZolin (ANCEF) IVPB 2 g/50 mL premix  Status:  Discontinued     2 g 100 mL/hr over 30 Minutes Intravenous 3 times per day 06/29/13 1018 06/29/13 1024   06/29/13 1400  ceFAZolin (ANCEF) IVPB 2 g/50 mL premix     2 g 100 mL/hr over 30 Minutes Intravenous 3 times per day 06/29/13 1343 06/29/13 1437   06/27/13 1000  vancomycin (VANCOCIN) 1,500 mg in sodium chloride 0.9 % 500 mL IVPB  Status:  Discontinued     1,500 mg 250 mL/hr over 120 Minutes Intravenous Every 12 hours 06/27/13 0740 06/29/13 1343   06/26/13 1400  vancomycin (VANCOCIN) 1,500 mg in sodium chloride 0.9 % 500 mL IVPB  Status:  Discontinued     1,500 mg 250 mL/hr over 120 Minutes Intravenous Every 24 hours 06/25/13 1601 06/27/13 0740   06/26/13 0000  clindamycin (CLEOCIN) IVPB 900 mg  Status:  Discontinued     900 mg 100 mL/hr over 30 Minutes Intravenous 4 times per day 06/25/13 2327  06/29/13 1018   06/25/13 2118  polymyxin B 500,000 Units, bacitracin 50,000 Units in sodium chloride irrigation 0.9 % 500 mL irrigation  Status:  Discontinued       As needed 06/25/13 2120 06/25/13 2141   06/25/13 2000  piperacillin-tazobactam (ZOSYN) IVPB 3.375 g  Status:  Discontinued     3.375 g 12.5 mL/hr over 240 Minutes Intravenous Every 8 hours 06/25/13 1600 06/29/13 1018   06/25/13 1600  vancomycin (VANCOCIN) 500 mg in sodium chloride 0.9 % 100 mL IVPB     500 mg 100 mL/hr over 60 Minutes Intravenous STAT 06/25/13 1558 06/25/13 1815   06/25/13 1130  piperacillin-tazobactam (ZOSYN) IVPB 3.375 g     3.375 g 100 mL/hr over 30 Minutes Intravenous STAT 06/25/13 1048 06/25/13 1301   06/25/13 1100  vancomycin (VANCOCIN) IVPB 1000 mg/200 mL premix     1,000 mg 200 mL/hr over 60 Minutes Intravenous STAT 06/25/13 1048 06/25/13 1402      Assessment: 52 yo M on Ancef day # 5 for MSSA bacteremia, likely source R foot infection, now s/p AKA.  TEE completed negative for vegetation.  Noted slight increase in SCr but CrCl remains >50.    Goal of Therapy:  Renal dose adjustment of antibiotics  Plan:  Continue Ancef 2gm IV q8h. Anticipate no further dose adjustments needed. Anticipate 2 weeks of therapy per ID recommendations for bacteremia. Pharmacy will sign off.  [employer], Pharm.D., BCPS Clinical Pharmacist Pager [phone] 07/03/2013 11:35 AM

## 2013-07-03 NOTE — PMR Pre-admission (Signed)
PMR Admission Coordinator Pre-Admission Assessment  Patient: Albert [name] is an 52 y.o., male MRN: [id number] DOB: 1961-08-05 Height: 5\' 11"  (180.3 cm) Weight: 121.564 kg (268 lb)              Insurance Information HMO:      PPO: yes     PCP:       IPA:       80/20:       OTHER:   PRIMARY: [employer]      Policy#: [id number]      Subscriber: pt CM Name: Bertram Denver      Phone#: [phone]      Pre-Cert#: [id number]      Employer: [employer] Tobacco Benefits:  Phone #: [phone]     Name: Unk Pinto. 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 Has left foot wound, prior left CVA as well with residual right HP  History of Present Illness:   Albert [name] is a 52 y.o. 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 Admitted 06/25/2013 with nonhealing ulcer to the right foot and Charcot collapse right foot. Underwent irrigation and debridement of right foot plantar abscess 06/25/2013 per Dr. Magnus Ivan. Postoperatively with hypotension and tachycardia. Cardiology services consulted with findings of elevated troponin levels of 10. Echocardiogram with distal septal hypokinesis with overall low-normal LVEF. Elevated troponin levels are likely related to underlying sepsis. Underwent cardiac  catheterization 06/28/2013 with findings of triple vessel CAD NSTEMI secondary to demand ischemia and mild LV systolic dysfunction. Patient placed on medical management there is concern of possible need for CABG. Patient with ongoing ischemic changes right lower extremity and underwent right below-knee amputation 06/29/2013 per Dr. Lajoyce Corners. Cardiothoracic surgery followup to consider CABG after recovery from BKA. Patient presently maintained on aspirin therapy. Physical therapy evaluation completed 06/30/2013 with recommendations of physical medicine rehabilitation consult to consider inpatient rehabilitation services.       Past Medical History  Past Medical History  Diagnosis Date  . Hypertension   . Diabetes mellitus without complication ~2000    last HbA1c ~9  . Hypercholesteremia   . CVA (cerebral infarction) 2011    R hand deficit  . Chronic sinusitis   . Allergic rhinitis   . Chronic cough   . Complication of anesthesia     couldn't swallow and talk  . Stroke     Family History  family history includes Breast cancer in his sister; Depression in his maternal grandfather, maternal grandmother, paternal grandfather, and paternal grandmother; Diabetes in his mother and another family member; Heart disease in his maternal grandfather, maternal grandmother, paternal grandfather, and paternal grandmother; Hyperlipidemia in his mother; Pancreatic cancer in his mother.  Prior Rehab/Hospitalizations: no   Current Medications  Current facility-administered medications:0.9 %  sodium chloride infusion, , Intravenous, Continuous, Nadara Mustard, MD, Last Rate: 10 mL/hr at 07/01/13 0401, 10 mL/hr at 07/01/13 0401;  0.9 %  sodium chloride infusion, , Intravenous, Continuous, Kathleene Hazel, MD;  acetaminophen (TYLENOL) suppository 650 mg, 650 mg, Rectal, Q6H PRN, Elease Etienne, MD;  acetaminophen (TYLENOL) tablet 650 mg, 650 mg, Oral, Q6H PRN, Elease Etienne, MD albuterol (PROVENTIL) (5  MG/ML) 0.5% nebulizer solution 2.5 mg, 2.5 mg, Nebulization, Q2H PRN, Elease Etienne, MD, 2.5 mg at 07/03/13 1138;  aspirin chewable tablet 81 mg, 81 mg, Oral, Daily, Kathleene Hazel, MD, 81 mg at 07/02/13 0944;  bisacodyl (DULCOLAX) suppository 10 mg, 10 mg, Rectal, Daily PRN, Merwyn Katos, MD;  ceFAZolin (ANCEF) IVPB 2 g/50 mL premix, 2 g, Intravenous, Q8H, Judyann Munson, MD, 2 g at 07/03/13 1437 docusate sodium (COLACE) capsule 100 mg, 100 mg, Oral, BID, Merwyn Katos, MD, 100 mg at 07/03/13 1117;  fentaNYL (SUBLIMAZE) injection 50-100 mcg, 50-100 mcg, Intravenous, Q2H PRN, Lonia Farber, MD, 100 mcg at 06/29/13 1507;  HYDROmorphone (DILAUDID) injection 0.5-1 mg, 0.5-1 mg, Intravenous, Q2H PRN, Nadara Mustard, MD, 1 mg at 07/01/13 1300 insulin aspart (novoLOG) injection 0-15 Units, 0-15 Units, Subcutaneous, TID WC, Simonne Martinet, NP, 5 Units at 07/03/13 1436;  insulin aspart (novoLOG) injection 0-5 Units, 0-5 Units, Subcutaneous, QHS, Simonne Martinet, NP, 3 Units at 07/02/13 2136;  insulin aspart (novoLOG) injection 6 Units, 6 Units, Subcutaneous, TID WC, Simonne Martinet, NP, 6 Units at 07/03/13 1437 [START ON 07/04/2013] insulin detemir (LEVEMIR) injection 25 Units, 25 Units, Subcutaneous, Daily, Penny Pia, MD;  metoCLOPramide (REGLAN) injection 5-10 mg, 5-10 mg, Intravenous, Q8H PRN, Nadara Mustard, MD;  metoCLOPramide (REGLAN) tablet 5-10 mg, 5-10 mg, Oral, Q8H PRN, Nadara Mustard, MD;  metoprolol (LOPRESSOR) tablet 50 mg, 50 mg, Oral, BID, Coralyn Helling, MD, 50 mg at 07/03/13 1116 ondansetron (ZOFRAN) injection 4 mg, 4 mg, Intravenous, Q6H PRN, Elease Etienne, MD, 4 mg at 06/27/13 2004;  ondansetron (ZOFRAN) tablet 4 mg, 4 mg, Oral, Q6H PRN, Elease Etienne, MD;  oxyCODONE (Oxy IR/ROXICODONE) immediate release tablet 5 mg, 5 mg, Oral, Q4H PRN, Lonia Farber, MD, 5 mg at 07/02/13 2359;  pantoprazole (PROTONIX) EC tablet 40 mg, 40 mg, Oral, Daily, Coralyn Helling, MD, 40  mg at 07/03/13 1117 polyethylene glycol (MIRALAX / GLYCOLAX) packet 17 g, 17 g, Oral, Daily PRN, Merwyn Katos, MD, 17 g at 07/01/13 2111;  pregabalin (LYRICA) capsule 75 mg, 75 mg, Oral, BID, Elease Etienne, MD, 75 mg at 07/03/13 1117;  simvastatin (ZOCOR) tablet 40 mg, 40 mg, Oral, QPM, Elease Etienne, MD, 40 mg at 07/02/13 1816;  sodium chloride 0.9 % injection 10-40 mL, 10-40 mL, Intracatheter, PRN, Kathryne Hitch, MD, 10 mL at 07/03/13 5621 zolpidem (AMBIEN) tablet 5 mg, 5 mg, Oral, QHS PRN, Elease Etienne, MD, 5 mg at 07/02/13 2137  Patients Current Diet: Carb Control  Precautions / Restrictions Precautions Precautions: Fall Restrictions Weight Bearing Restrictions: No RLE Weight Bearing: Non weight bearing Other Position/Activity Restrictions: elevate operated limb, do not prop pillows under knee; try to position on LEFT side so that RIGHT hip can be in neutral/extension   Prior Activity Level Limited Community (1-2x/wk): Independent-drove-unsuccessful 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, newspaper / Equipment Home Assistive Devices/Equipment: [employer] Meter Home Equipment: Environmental consultant - 2 wheels;Wheelchair -  manual (can borrow w/c )  Prior Functional Level Prior Function Level of Independence: Independent (worked FT at [employer] till January 2014 when had L toe amps)  Current Functional Level Cognition  Overall Cognitive Status: Within Functional Limits for tasks assessed Orientation Level: Oriented X4    Extremity Assessment (includes Sensation/Coordination)          ADLs  Grooming: Set up Where Assessed - Grooming: Unsupported sitting Upper Body Bathing: Minimal assistance Where Assessed - Upper Body Bathing: Unsupported sitting Lower Body Bathing: Maximal assistance Where Assessed - Lower Body Bathing: Lean right and/or left Upper Body Dressing: Set up;Supervision/safety Where Assessed - Upper Body Dressing: Unsupported sitting Lower Body  Dressing: Maximal assistance Where Assessed - Lower Body Dressing: Lean right and/or left Toilet Transfer: +2 Total assistance (for safety. uses scoot technique) Toilet Transfer: Patient Percentage: 70% (Pt not safe at this time for stand pivot transfer) Toileting - Clothing Manipulation and Hygiene: Minimal assistance Where Assessed - Toileting Clothing Manipulation and Hygiene: Lean right and/or left Transfers/Ambulation Related to ADLs: scoot transfer, +2 for safety ADL Comments: pt has fear of falling    Mobility  Bed Mobility: Supine to Sit Supine to Sit: 5: Supervision;HOB elevated;With rails    Transfers  Transfers: Lateral/Scoot Transfers Lateral/Scoot Transfers: 3: Mod assist    Ambulation / Gait / Stairs / Psychologist, prison and probation services  Ambulation/Gait Ambulation/Gait Assistance: Not tested (comment) Stairs: No Corporate treasurer: No    Posture / Balance Dynamic Sitting Balance Dynamic Sitting - Balance Activities:  (excellent balance with left foot supported to shift and scoo)    Special needs/care consideration  Skin: L. toes amputated, except, great toe-slow healing-pt says looking best it has since amputations Jan/Feb.  Now w/ R. BKA.  Also, has some arm bruises. Bowel mgmt:  LBM 9/8 PTA! Bladder mgmt: continent Diabetic mgmt: yes     Previous Home Environment Living Arrangements: Spouse/significant other Available Help at Discharge: Available PRN/intermittently Type of Home: House Home Layout: Two level Alternate Level Stairs-Rails: None Alternate Level Stairs-Number of Steps: 1-2 steps between living areas Home Access: Level entry Bathroom Shower/Tub: Tub/shower unit;Curtain Bathroom Toilet: Handicapped height Bathroom Accessibility: Yes How Accessible: Accessible via wheelchair;Accessible via walker Home Care Services: No  Discharge Living Setting Plans for Discharge Living Setting: Patient's home Type of Home at Discharge:  House Discharge Home Layout: Two level (has 2 steps down to master bed/bath) Alternate Level Stairs-Rails: None Alternate Level Stairs-Number of Steps: 2 Discharge Home Access: Stairs to enter (1 step up to doorway & then over threshold) Does the patient have any problems obtaining your medications?: No  Social/Family/Support Systems Patient Roles: Spouse;Parent  [has teenage son] Contact Information: pt's cell [phone] Anticipated Caregiver: wife, [name] Anticipated Caregiver's Contact Information: wife's cell# [phone] Ability/Limitations of Caregiver: Min A Caregiver Availability: Intermittent (wife works FT evenings) Discharge Plan Discussed with Primary Caregiver: Yes Is Caregiver In Agreement with Plan?: Yes Does Caregiver/Family have Issues with Lodging/Transportation while Pt is in Rehab?: No    Goals/Additional Needs Patient/Family Goal for Rehab: Mod I Expected length of stay: 10-14 days Pt/Family Agrees to Admission and willing to participate: Yes Program Orientation Provided & Reviewed with Pt/Caregiver Including Roles  & Responsibilities: Yes   Decrease burden of Care through IP rehab admission: Specialzed equipment needs, Decrease number of caregivers and Patient/family education   Possible need for SNF placement upon discharge: not expected   Patient Condition: This patient's condition remains as documented in the consult dated 07/01/13, in which the Rehabilitation  Physician determined and documented that the patient's condition is appropriate for intensive rehabilitative care in an inpatient rehabilitation facility. Will admit to inpatient rehab today.  Preadmission Screen Completed By:  Brock Ra, 07/03/2013 3:48 PM ______________________________________________________________________   Discussed status with Dr. Riley Kill on 07/03/13 at 3:48pm  and received telephone approval for admission today.  Admission Coordinator:  Brock Ra, time 3:55pm/Date  07/03/13

## 2013-07-03 NOTE — H&P (Signed)
Physical Medicine and Rehabilitation Admission H&P    Chief Complaint  Patient presents with  . Abscess  : HPI: Albert [name] is a 52 y.o. 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 Admitted 06/25/2013 with nonhealing ulcer to the right foot and Charcot collapse right foot. Underwent irrigation and debridement of right foot plantar abscess 06/25/2013 per Dr. Magnus Ivan. Postoperatively with hypotension and tachycardia. Cardiology services consulted with findings of elevated troponin levels of 10. Echocardiogram with distal septal hypokinesis with overall low-normal LVEF. Elevated troponin levels are likely related to underlying sepsis. Underwent cardiac catheterization 06/28/2013 with findings of triple vessel CAD NSTEMI secondary to demand ischemia and mild LV systolic dysfunction. Patient placed on medical management with aspirin therapy. Patient with ongoing ischemic changes right lower extremity and underwent right below-knee amputation 06/29/2013 per Dr. Lajoyce Corners. Cardiothoracic surgery followup to consider CABG after recovery from BKA. TEE completed 07/03/2013 any vegetation that was negative with ejection fraction 45-50% .patient currently remains on Ancef therapy per infectious disease for sepsis /MSSA bacteremia with plan for 28 day duration. Physical therapy evaluation completed 06/30/2013 with recommendations of physical medicine rehabilitation consult to consider inpatient rehabilitation services. Patient was felt to be a good candidate for inpatient rehabilitation services was admitted for comprehensive rehabilitation program today.  Review of Systems  Constitutional: Positive for fever.  Respiratory: Positive for cough.  Cardiovascular: Positive for leg swelling.  Neurological:  Positive for weakness.  All other systems reviewed and are negative   Past Medical History  Diagnosis Date  . Hypertension   . Diabetes mellitus without complication ~2000    last HbA1c ~9  . Hypercholesteremia   . CVA (cerebral infarction) 2011    R hand deficit  . Chronic sinusitis   . Allergic rhinitis   . Chronic cough   . Complication of anesthesia     couldn't swallow and talk  . Stroke    Past Surgical History  Procedure Laterality Date  . 4th toe amputation Left Jan. 2014    4th toe in [place]  . Tonsillectomy and adenoidectomy    . Penile prosthesis implant    . I&d extremity Left 12/08/2012    Procedure: IRRIGATION AND DEBRIDEMENT EXTREMITY;  Surgeon: Kathryne Hitch, MD;  Location: [facility] OR;  Service: Orthopedics;  Laterality: Left;  . Amputation Left 12/08/2012    Procedure: AMPUTATION DIGIT;  Surgeon: Kathryne Hitch, MD;  Location: [facility] OR;  Service: Orthopedics;  Laterality: Left;  3rd toe amputation possible 5th toe amputation  . I&d extremity Left 12/11/2012    Procedure: REPEAT I&D LEFT FOOT;  Surgeon: Kathryne Hitch, MD;  Location: [facility] OR;  Service: Orthopedics;  Laterality: Left;  . Amputation Left 12/11/2012    Procedure: Amputation of 2nd Toe;  Surgeon: Kathryne Hitch, MD;  Location: [facility] OR;  Service: Orthopedics;  Laterality: Left;  . Bypass graft popliteal to tibial Left 12/13/2012    Procedure: BYPASS GRAFT POPLITEAL TO TIBIAL;  Surgeon: Nada Libman, MD;  Location: [facility] OR;  Service: Vascular;  Laterality: Left;  Left Popliteal to Posterior Tibial Bypass Graft with reversed saphenous vein graft  . Incision and drainage Right 06/25/2013    Procedure: INCISION AND DRAINAGE RIGHT FOOT;  Surgeon: Kathryne Hitch, MD;  Location: [facility] ORS;  Service: Orthopedics;  Laterality: Right;  . Amputation Right 06/29/2013  Procedure: AMPUTATION BELOW KNEE;  Surgeon: Nadara Mustard, MD;  Location: [facility] OR;  Service: Orthopedics;  Laterality: Right;    Family History  Problem Relation Age of Onset  . Diabetes    . Pancreatic cancer Mother   . Diabetes Mother   . Hyperlipidemia Mother   . Breast cancer Sister   . Depression Maternal Grandmother   . Heart disease Maternal Grandmother   . Depression Maternal Grandfather   . Heart disease Maternal Grandfather   . Depression Paternal Grandmother   . Heart disease Paternal Grandmother   . Depression Paternal Grandfather   . Heart disease Paternal Grandfather    Social History:  reports that he has quit smoking. He has never used smokeless tobacco. He reports that he does not drink alcohol or use illicit drugs. Allergies:  Allergies  Allergen Reactions  . Amoxicillin     vomiting   Medications Prior to Admission  Medication Sig Dispense Refill  . aspirin 81 MG tablet Take 81 mg by mouth daily.      Marland Kitchen glyBURIDE-metformin (GLUCOVANCE) 2.5-500 MG per tablet Take 1 tablet by mouth daily with breakfast.      . insulin aspart (NOVOLOG FLEXPEN) 100 UNIT/ML injection Inject 12 Units into the skin 3 (three) times daily. Take every time he eats per patient      . insulin detemir (LEVEMIR) 100 UNIT/ML injection Inject 20 Units into the skin every evening.      . irbesartan-hydrochlorothiazide (AVALIDE) 300-12.5 MG per tablet Take 1 tablet by mouth daily.      . metoprolol tartrate (LOPRESSOR) 25 MG tablet Take 25 mg by mouth 2 (two) times daily.      . ondansetron (ZOFRAN) 8 MG tablet Take by mouth every 8 (eight) hours as needed for nausea. For upset stomach      . pregabalin (LYRICA) 75 MG capsule Take 75 mg by mouth 2 (two) times daily.      . Rivaroxaban (XARELTO) 20 MG TABS Take 1 tablet (20 mg total) by mouth daily with supper.  30 tablet  2  . silver sulfADIAZINE (SILVADENE) 1 % cream Apply 1 application topically daily.      . simvastatin (ZOCOR) 40 MG tablet Take 40 mg by mouth every evening.        Home: Home Living Family/patient expects to be discharged to:: Private  residence Living Arrangements: Spouse/significant other Available Help at Discharge: Available PRN/intermittently Type of Home: House Home Access: Level entry Home Layout: Two level Alternate Level Stairs-Number of Steps: 1 step between living areas Alternate Level Stairs-Rails: None Home Equipment: Walker - 2 wheels;Wheelchair - manual (can borrow w/c )   Functional History:    Functional Status:  Mobility: Bed Mobility Bed Mobility: Supine to Sit Supine to Sit: 5: Supervision;HOB elevated;With rails Transfers Transfers: Lateral/Scoot Transfers Lateral/Scoot Transfers: 3: Mod assist Ambulation/Gait Ambulation/Gait Assistance: Not tested (comment) Stairs: No Wheelchair Mobility Wheelchair Mobility: No  ADL: ADL Grooming: Set up Where Assessed - Grooming: Unsupported sitting Upper Body Bathing: Minimal assistance Where Assessed - Upper Body Bathing: Unsupported sitting Lower Body Bathing: Maximal assistance Where Assessed - Lower Body Bathing: Lean right and/or left Upper Body Dressing: Set up;Supervision/safety Where Assessed - Upper Body Dressing: Unsupported sitting Lower Body Dressing: Maximal assistance Where Assessed - Lower Body Dressing: Lean right and/or left Toilet Transfer: +2 Total assistance (for safety. uses scoot technique) Transfers/Ambulation Related to ADLs: scoot transfer, +2 for safety ADL Comments: pt has fear of falling  Cognition: Cognition Overall  Cognitive Status: Within Functional Limits for tasks assessed Orientation Level: Oriented X4 Cognition Arousal/Alertness: Awake/alert Behavior During Therapy: WFL for tasks assessed/performed Overall Cognitive Status: Within Functional Limits for tasks assessed  Physical Exam: Blood pressure 124/93, pulse 116, temperature 97.7 F (36.5 C), temperature source Oral, resp. rate 17, height 5\' 11"  (1.803 m), weight 121.564 kg (268 lb), SpO2 99.00%. Constitutional: He is oriented to person, place, and  time.  HENT:  Head: Normocephalic.dentition fair. Oral mucosa pink and moist  Eyes: EOM are normal.  Neck: Normal range of motion. Neck supple. No thyromegaly present.  Cardiovascular: Normal rate. No murmur or gallop Pulmonary/Chest: Effort normal and breath sounds normal. No respiratory distress. No wheezes Abdominal: Soft. Bowel sounds are normal. He exhibits no distension.  Neurological: He is alert and oriented to person, place, and time. CN grossly intact. DTR's decreased Pt is alert and appropriate. Right deltoid is 3/5. Bicep/tricep 4/5. HI are 3-. LUE is grossly 5/5. LLE is 4-/5 proximal to 3+/4- distal. Sensation grossly intact except for LT distal LLE.  Skin:  Right amputation site is dressed with an immediate post-op compression wrap. There is a heavy dressing also to the left foot without signs of seepage or drainage.  Psychiatric: He has a normal mood and affect. His behavior is normal. Judgment and thought content normal.     Results for orders placed during the hospital encounter of 06/25/13 (from the past 48 hour(s))  GLUCOSE, CAPILLARY     Status: Abnormal   Collection Time    07/01/13  4:58 PM      Result Value Range   Glucose-Capillary 185 (*) 70 - 99 mg/dL   Comment 1 Documented in Chart     Comment 2 Notify RN    GLUCOSE, CAPILLARY     Status: Abnormal   Collection Time    07/01/13  9:08 PM      Result Value Range   Glucose-Capillary 149 (*) 70 - 99 mg/dL   Comment 1 Documented in Chart     Comment 2 Notify RN    GLUCOSE, CAPILLARY     Status: Abnormal   Collection Time    07/02/13  8:16 AM      Result Value Range   Glucose-Capillary 188 (*) 70 - 99 mg/dL   Comment 1 Notify RN    GLUCOSE, CAPILLARY     Status: Abnormal   Collection Time    07/02/13 12:11 PM      Result Value Range   Glucose-Capillary 228 (*) 70 - 99 mg/dL   Comment 1 Notify RN    GLUCOSE, CAPILLARY     Status: Abnormal   Collection Time    07/02/13  5:11 PM      Result Value Range    Glucose-Capillary 252 (*) 70 - 99 mg/dL   Comment 1 Documented in Chart     Comment 2 Notify RN    GLUCOSE, CAPILLARY     Status: Abnormal   Collection Time    07/02/13  9:30 PM      Result Value Range   Glucose-Capillary 266 (*) 70 - 99 mg/dL  BASIC METABOLIC PANEL     Status: Abnormal   Collection Time    07/03/13  6:25 AM      Result Value Range   Sodium 130 (*) 135 - 145 mEq/L   Potassium 3.8  3.5 - 5.1 mEq/L   Chloride 96  96 - 112 mEq/L   CO2 22  19 - 32 mEq/L  Glucose, Bld 222 (*) 70 - 99 mg/dL   BUN 35 (*) 6 - 23 mg/dL   Comment: REPEATED TO VERIFY   Creatinine, Ser 1.35  0.50 - 1.35 mg/dL   Comment: REPEATED TO VERIFY   Calcium 7.7 (*) 8.4 - 10.5 mg/dL   GFR calc non Af Amer 60 (*) >90 mL/min   GFR calc Af Amer 69 (*) >90 mL/min   Comment: (NOTE)     The eGFR has been calculated using the CKD EPI equation.     This calculation has not been validated in all clinical situations.     eGFR's persistently <90 mL/min signify possible Chronic Kidney     Disease.  PROTIME-INR     Status: None   Collection Time    07/03/13  6:25 AM      Result Value Range   Prothrombin Time 14.7  11.6 - 15.2 seconds   INR 1.17  0.00 - 1.49  GLUCOSE, CAPILLARY     Status: Abnormal   Collection Time    07/03/13  8:13 AM      Result Value Range   Glucose-Capillary 195 (*) 70 - 99 mg/dL   Comment 1 Documented in Chart     Comment 2 Notify RN    GLUCOSE, CAPILLARY     Status: Abnormal   Collection Time    07/03/13 10:00 AM      Result Value Range   Glucose-Capillary 238 (*) 70 - 99 mg/dL   Comment 1 Documented in Chart    GLUCOSE, CAPILLARY     Status: Abnormal   Collection Time    07/03/13 12:44 PM      Result Value Range   Glucose-Capillary 227 (*) 70 - 99 mg/dL   Comment 1 Documented in Chart     Comment 2 Notify RN     No results found.  Post Admission Physician Evaluation: 1. Functional deficits secondary  to right BKA in a man with a prior left CVA. 2. Patient is  admitted to receive collaborative, interdisciplinary care between the physiatrist, rehab nursing staff, and therapy team. 3. Patient's level of medical complexity and substantial therapy needs in context of that medical necessity cannot be provided at a lesser intensity of care such as a SNF. 4. Patient has experienced substantial functional loss from his/her baseline which was documented above under the "Functional History" and "Functional Status" headings.  Judging by the patient's diagnosis, physical exam, and functional history, the patient has potential for functional progress which will result in measurable gains while on inpatient rehab.  These gains will be of substantial and practical use upon discharge  in facilitating mobility and self-care at the household level. 5. Physiatrist will provide 24 hour management of medical needs as well as oversight of the therapy plan/treatment and provide guidance as appropriate regarding the interaction of the two. 6. 24 hour rehab nursing will assist with bladder management, bowel management, safety, skin/wound care, disease management, medication administration, pain management and patient education  and help integrate therapy concepts, techniques,education, etc. 7. PT will assess and treat for/with: Lower extremity strength, range of motion, stamina, balance, functional mobility, safety, adaptive techniques and equipment, pain mgt, pre-prosthetic education, skin precautions.   Goals are: supervision to min assist. 8. OT will assess and treat for/with: ADL's, functional mobility, safety, upper extremity strength, adaptive techniques and equipment, NMR, pain mgt, education.   Goals are: minimal to moderate assist. 9. SLP will assess and treat for/with: n/a.  Goals are: n/a.  10. Case Management and Social Worker will assess and treat for psychological issues and discharge planning. 11. Team conference will be held weekly to assess progress toward goals and to  determine barriers to discharge. 12. Patient will receive at least 3 hours of therapy per day at least 5 days per week. 13. ELOS: 2 weeks       14. Prognosis:  excellent   Medical Problem List and Plan: 1. Right BKA due to a Charcot foot/abscess, prior left CVA with residual right-sided weakness 2. DVT Prophylaxis/Anticoagulation: SCDs left lower extremity. 3. Pain Management: Lyrica 75 mg twice a day, Oxycodone as needed. Monitor with increased mobility 4. Neuropsych: This patient is capable of making decisions on his own behalf. 5. CAD/NSTEMI. Continue aspirin therapy. Plan followup cardiothoracic surgery 6-8 weeks to consider CABG. Patient had chest pain or shortness of breath 6. ID. Sepsis/MSSA bacteremia. Continue Ancef as directed x28 days total initiated 06/25/2013. 7. Diabetes mellitus with peripheral neuropathy. Hemoglobin A1c 8.2. Levemir 25 units daily, NovoLog 6 units 3 times a day. Check blood sugars a.c. and at bedtime 8. Hypertension. Lopressor 50 mg twice a day. Monitor with increased mobility 9. Acute blood loss anemia. Latest hemoglobin 10.8. Followup CBC 10. Recent multi-toe amputation left foot. Continue wound care as advised. Monitor for increased ischemic changes. Need to be careful with transfers or any attempts with gait. Followup vascular surgery  Ranelle Oyster, MD, [facility] Physical Medicine & Rehabilitation  07/03/2013

## 2013-07-03 NOTE — Discharge Summary (Signed)
Physician Discharge Summary  [name] RUE:[id number] DOB: July 03, 1961 DOA: 06/25/2013  PCP: [name], MD  Admit date: 06/25/2013 Discharge date: 07/03/2013  Time spent: > 35  minutes  Recommendations for Outpatient Follow-up:  1. Pt will need to be evaluated by Cardiology for CABG 2. ID will need to continue to monitor patient for further recommendations. 3. Patient will need to follow up as outlined by Ortho in 1-2 weeks.  Discharge Diagnoses:  Principal Problem:   Foot abscess, right Active Problems:   Hypercholesteremia   Diabetes mellitus out of control   Essential hypertension, benign   Atherosclerotic peripheral vascular disease   AKI (acute kidney injury)   Sepsis   Dehydration with hyponatremia   Leukocytosis, unspecified   Sepsis(995.91)   SEMI (subendocardial myocardial infarction)   Coronary atherosclerosis of native coronary artery   Cardiomyopathy, ischemic   Discharge Condition: Stable  Diet recommendation: diabetic diet  Filed Weights   07/01/13 2011 07/03/13 0518 07/03/13 0843  Weight: 121 kg (266 lb 12.1 oz) 122 kg (268 lb 15.4 oz) 121.564 kg (268 lb)    History of present illness:  Albert [name] is a 52 y.o. male with poorly controlled DM, presents with SIRS and MSSA bacteremia from gangrenous right foot s/p debridement c/b perioperative NSTEMI found to have severe 3-vessel CAD and POD#1 s/p trans-tibial amputation of right leg on 9/13  Hospital Course:  1. R BKA - Recommendations per ortho and cleared from ortho to transition to SNF vs CIR  - CIR consulted based on Physical therapy's recommendations.   2. NSTEMI post op  - Cardiology on board and managing will defer recommendations to them  - Plan is to have CT surgery evaluate prior to d/c to discuss planning for CABG given recent h/o severe three vessel CAD on cardiac cath in 06/28/13  - Plan at this juncture is to manage medically: ASA, statin, beta blocker, Ace-inh.   3. MSSA  bacteremia  - ID on board and managing antibiotics. Will defer further recommendations to them and once patient ready to go to SNF will place on the antibiotic regimen that they will outline moving forward.  - Source suspected to be 2ary to right gangrenous foot  - TEE completed no valvular vegetations reported.   4. DM  - continue diabetic diet  - will hold oral hypoglycemic agents. - d/c on increased dose of Levemir 25 units qhs  5. HTN  - will continue ace inhibitor and B blocker as recommended by cardiology - Will continue to monitor.   6. HPL  -stable  - continue statin  Procedures:  TEE  Transtibial amputation of right leg 9/13  Consultations:  Ortho: Dr. Lajoyce Corners  Cardiology: Dr. Clifton James  ID: Ninetta Lights  Discharge Exam: Filed Vitals:   07/03/13 1418  BP: 100/77  Pulse: 93  Temp: 97.7 F (36.5 C)  Resp: 18    General: Pt in NAD, Alert and Awake Cardiovascular: RRR, no MRG Respiratory: CTA BL, no wheezes  Discharge Instructions  Discharge Orders   Future Appointments Provider Department Dept Phone   07/15/2013 10:00 AM [facility] [phone]   07/15/2013 10:45 AM Nada Libman, MD [facility] [phone]   09/03/2013 1:30 PM Loreli Slot, MD [facility] [phone]   Future Orders Complete By Expires   Call MD for:  persistant nausea and vomiting  As directed    Call MD for:  redness, tenderness,  or signs of infection (pain, swelling, redness, odor or green/yellow discharge around incision site)  As directed    Call MD for:  severe uncontrolled pain  As directed    Call MD for:  temperature >100.4  As directed    Call MD for:  As directed    Diet - low sodium heart healthy  As directed    Discharge instructions  As directed    Comments:     You will need to take Ancef for 3 more weeks. F/u with cardiology and infectious disease  for further evaluation and recommendations.   Increase activity slowly  As directed        Medication List    STOP taking these medications       glyBURIDE-metformin 2.5-500 MG per tablet  Commonly known as:  GLUCOVANCE     irbesartan-hydrochlorothiazide 300-12.5 MG per tablet  Commonly known as:  AVALIDE     metoprolol tartrate 25 MG tablet  Commonly known as:  LOPRESSOR     Rivaroxaban 20 MG Tabs tablet  Commonly known as:  XARELTO      TAKE these medications       aspirin 81 MG tablet  Take 81 mg by mouth daily.     ceFAZolin 2-3 GM-% Solr  Commonly known as:  ANCEF  Inject 50 mLs (2 g total) into the vein every 8 (eight) hours.     DSS 100 MG Caps  Take 100 mg by mouth 2 (two) times daily.     fentaNYL 0.05 MG/ML injection  Commonly known as:  SUBLIMAZE  Inject 1-2 mLs (50-100 mcg total) into the vein every 2 (two) hours as needed for severe pain (or RASS > 0).     insulin detemir 100 UNIT/ML injection  Commonly known as:  LEVEMIR  Inject 0.25 mLs (25 Units total) into the skin every evening.     metoCLOPramide 5 MG tablet  Commonly known as:  REGLAN  Take 1-2 tablets (5-10 mg total) by mouth every 8 (eight) hours as needed (if ondansetron (ZOFRAN) ineffective.).     metoCLOPramide 5 MG/ML injection  Commonly known as:  REGLAN  Inject 1-2 mLs (5-10 mg total) into the vein every 8 (eight) hours as needed (if ondansetron (ZOFRAN) ineffective.).     NOVOLOG FLEXPEN 100 UNIT/ML injection  Generic drug:  insulin aspart  Inject 12 Units into the skin 3 (three) times daily. Take every time he eats per patient     ondansetron 8 MG tablet  Commonly known as:  ZOFRAN  Take by mouth every 8 (eight) hours as needed for nausea. For upset stomach     oxyCODONE 5 MG immediate release tablet  Commonly known as:  Oxy IR/ROXICODONE  Take 1 tablet (5 mg total) by mouth every 4 (four) hours as needed.     pantoprazole 40 MG tablet  Commonly known as:  PROTONIX  Take 1  tablet (40 mg total) by mouth daily.     pregabalin 75 MG capsule  Commonly known as:  LYRICA  Take 75 mg by mouth 2 (two) times daily.     silver sulfADIAZINE 1 % cream  Commonly known as:  SILVADENE  Apply 1 application topically daily.     simvastatin 40 MG tablet  Commonly known as:  ZOCOR  Take 40 mg by mouth every evening.       Allergies  Allergen Reactions  . Amoxicillin     vomiting       Follow-up  Information   Follow up with DUDA,MARCUS V, MD In 2 weeks.   Specialty:  Orthopedic Surgery   Contact information:   [address] [place] [address] [phone]        The results of significant diagnostics from this hospitalization (including imaging, microbiology, ancillary and laboratory) are listed below for reference.    Significant Diagnostic Studies: Dg Chest Port 1 View  06/27/2013   *RADIOLOGY REPORT*  Clinical Data: Shortness of breath, evaluate endotracheal tube position  PORTABLE CHEST - 1 VIEW  Comparison: Chest x-ray of 01/15/2013  Findings: No endotracheal tube is visible.  The lungs are not as well aerated and may be mild pulmonary vascular congestion present with some basilar volume loss.  The heart is within upper limits of normal.  No bony abnormality is seen.  IMPRESSION: Diminished aeration.  Question mild pulmonary vascular congestion.   Original Report Authenticated By: Dwyane Dee, M.D.   Dg Foot Complete Right  06/25/2013   *RADIOLOGY REPORT*  Clinical Data: Foot abscess; diabetes mellitus  RIGHT FOOT COMPLETE - 3+ VIEW  Comparison: None.  Findings: Frontal, oblique, lateral views were obtained.  There are Charcot changes throughout the midfoot.  There is fragmentation in the tarsal - metatarsal region with a divergent Lisfranc type fracture in the mid foot between the first and second metatarsals.  There is extensive soft tissue air throughout the volar aspect of the foot consistent with widespread abscess.  There is no well- defined  osteomyelitis on this study.  There is narrowing of all PIP and DIP joints.  There is pes planus.  IMPRESSION: Extensive soft tissue air consistent with abscess tracking along the volar aspect of the foot somewhat diffusely but concentrated primarily at the level of the metatarsals.  No overt osteomyelitis.  Charcot changes with fragmentation of the mid foot region. Lisfranc type separation between the first and second metatarsals.  Pes planus.  Multilevel osteoarthritic change in the PIP and DIP joints.   Original Report Authenticated By: Bretta Bang, M.D.    Microbiology: Recent Results (from the past 240 hour(s))  CULTURE, BLOOD (ROUTINE X 2)     Status: None   Collection Time    06/26/13  1:00 AM      Result Value Range Status   Specimen Description BLOOD RIGHT ARM   Final   Special Requests     Final   Value: BOTTLES DRAWN AEROBIC AND ANAEROBIC  ,   Culture  Setup Time     Final   Value: 06/26/2013 04:11     Performed at [facility]   Culture     Final   Value: STAPHYLOCOCCUS AUREUS     Note: SUSCEPTIBILITIES PERFORMED ON PREVIOUS CULTURE WITHIN THE LAST 5 DAYS.     Note: Gram Stain Report Called to,Read Back By and Verified With: STACY PHELPS 06/28/13 1155 BY SMITHERSJ     Performed at [facility]   Report Status 06/30/2013 FINAL   Final  CULTURE, BLOOD (ROUTINE X 2)     Status: None   Collection Time    06/26/13  1:08 AM      Result Value Range Status   Specimen Description BLOOD RIGHT ARM   Final   Special Requests BOTTLES DRAWN AEROBIC AND ANAEROBIC    Final   Culture  Setup Time     Final   Value: 06/26/2013 04:10     Performed at [facility]  Final   Value: STAPHYLOCOCCUS AUREUS     Note: RIFAMPIN AND GENTAMICIN SHOULD NOT BE USED AS SINGLE DRUGS FOR TREATMENT OF STAPH INFECTIONS.     Note: Gram Stain Report Called to,Read Back By and Verified With: JOHN HUNT ON 06/28/2013 AT 8:21P BY Serafina Mitchell     Performed at  [facility]   Report Status 06/30/2013 FINAL   Final   Organism ID, Bacteria STAPHYLOCOCCUS AUREUS   Final  MRSA PCR SCREENING     Status: None   Collection Time    06/26/13  8:08 AM      Result Value Range Status   MRSA by PCR NEGATIVE  NEGATIVE Final   Comment:            The GeneXpert MRSA Assay (FDA     approved for NASAL specimens     only), is one component of a     comprehensive MRSA colonization     surveillance program. It is not     intended to diagnose MRSA     infection nor to guide or     monitor treatment for     MRSA infections.  URINE CULTURE     Status: None   Collection Time    06/28/13  6:05 PM      Result Value Range Status   Specimen Description URINE, CATHETERIZED   Final   Special Requests Normal   Final   Culture  Setup Time     Final   Value: 06/28/2013 18:30     Performed at [facility] Count     Final   Value: NO GROWTH     Performed at [facility]   Culture     Final   Value: NO GROWTH     Performed at [facility]   Report Status 06/30/2013 FINAL   Final  CULTURE, BLOOD (ROUTINE X 2)     Status: None   Collection Time    06/29/13  3:10 PM      Result Value Range Status   Specimen Description BLOOD RIGHT ANTECUBITAL   Final   Special Requests BOTTLES DRAWN AEROBIC AND ANAEROBIC 10CC   Final   Culture  Setup Time     Final   Value: 06/29/2013 21:30     Performed at [facility]   Culture     Final   Value:        BLOOD CULTURE RECEIVED NO GROWTH TO DATE CULTURE WILL BE HELD FOR 5 DAYS BEFORE ISSUING A FINAL NEGATIVE REPORT     Performed at [facility]   Report Status PENDING   Incomplete  CULTURE, BLOOD (ROUTINE X 2)     Status: None   Collection Time    06/29/13  3:20 PM      Result Value Range Status   Specimen Description BLOOD RIGHT HAND   Final   Special Requests BOTTLES DRAWN AEROBIC AND ANAEROBIC 10CC   Final   Culture  Setup Time     Final   Value: 06/29/2013 21:31      Performed at [facility]   Culture     Final   Value:        BLOOD CULTURE RECEIVED NO GROWTH TO DATE CULTURE WILL BE HELD FOR 5 DAYS BEFORE ISSUING A FINAL NEGATIVE REPORT     Performed at [facility]   Report Status PENDING   Incomplete  Labs: Basic Metabolic Panel:  Recent Labs Lab 06/28/13 0330 06/29/13 0425 06/30/13 0148 07/01/13 1045 07/03/13 0625  NA 131* 132* 129* 129* 130*  K 3.8 3.7 4.2 3.6 3.8  CL 100 100 98 97 96  CO2 21 22 20 23 22   GLUCOSE 147* 167* 232* 263* 222*  BUN 22 20 18 17  35*  CREATININE 0.94 0.91 0.73 0.74 1.35  CALCIUM 8.2* 8.3* 7.9* 7.5* 7.7*   Liver Function Tests:  Recent Labs Lab 06/27/13 0200  AST 64*  ALT 33  ALKPHOS 64  BILITOT 0.5  PROT 5.7*  ALBUMIN 2.0*   No results found for this basename: LIPASE, AMYLASE,  in the last 168 hours No results found for this basename: AMMONIA,  in the last 168 hours CBC:  Recent Labs Lab 06/27/13 0200 06/28/13 0330 06/29/13 0425 06/30/13 0148 07/01/13 1045  WBC 10.3 11.4* 14.7* 26.0* 22.3*  HGB 10.5* 9.5* 8.9* 9.5* 8.6*  HCT 31.9* 28.4* 26.6* 28.1* 25.3*  MCV 79.4 78.9 79.2 79.8 79.3  PLT 201 242 297 463* 516*   Cardiac Enzymes: No results found for this basename: CKTOTAL, CKMB, CKMBINDEX, TROPONINI,  in the last 168 hours BNP: BNP (last 3 results) No results found for this basename: PROBNP,  in the last 8760 hours CBG:  Recent Labs Lab 07/02/13 1711 07/02/13 2130 07/03/13 0813 07/03/13 1000 07/03/13 1244  GLUCAP 252* 266* 195* 238* 227*       Signed:  Penny Pia  [facility] 07/03/2013, 2:46 PM

## 2013-07-03 NOTE — Interval H&P Note (Signed)
Albert [name] was admitted today to Inpatient Rehabilitation with the diagnosis of right BKA.  The patient's history has been reviewed, patient examined, and there is no change in status.  Patient continues to be appropriate for intensive inpatient rehabilitation.  I have reviewed the patient's chart and labs.  Questions were answered to the patient's satisfaction.  [name] 07/03/2013, 5:57 PM

## 2013-07-03 NOTE — Progress Notes (Signed)
    SUBJECTIVE: No chest pain or SOB.   BP 121/87  Pulse 101  Temp(Src) 98.2 F (36.8 C) (Oral)  Resp 20  Ht 5\' 11"  (1.803 m)  Wt 268 lb 15.4 oz (122 kg)  BMI 37.53 kg/m2  SpO2 95%  Intake/Output Summary (Last 24 hours) at 07/03/13 4098 Last data filed at 07/02/13 2135  Gross per 24 hour  Intake    600 ml  Output    600 ml  Net      0 ml    PHYSICAL EXAM General: Well developed, well nourished, in no acute distress. Alert and oriented x 3.  Psych:  Flat affect, responds appropriately Neck: No JVD. No masses noted.  Lungs: Clear bilaterally with no wheezes or rhonci noted.  Heart: RRR with no murmurs noted. Abdomen: Bowel sounds are present. Soft, non-tender.  Extremities: Right BKA, left foot bandage.   LABS: Basic Metabolic Panel:  Recent Labs  11/91/47 1045  NA 129*  K 3.6  CL 97  CO2 23  GLUCOSE 263*  BUN 17  CREATININE 0.74  CALCIUM 7.5*   CBC:  Recent Labs  07/01/13 1045  WBC 22.3*  HGB 8.6*  HCT 25.3*  MCV 79.3  PLT 516*    Current Meds: . aspirin  81 mg Oral Daily  .  ceFAZolin (ANCEF) IV  2 g Intravenous Q8H  . docusate sodium  100 mg Oral BID  . insulin aspart  0-15 Units Subcutaneous TID WC  . insulin aspart  0-5 Units Subcutaneous QHS  . insulin aspart  6 Units Subcutaneous TID WC  . insulin detemir  23 Units Subcutaneous Daily  . lisinopril  5 mg Oral Daily  . metoprolol tartrate  50 mg Oral BID  . pantoprazole  40 mg Oral Daily  . pregabalin  75 mg Oral BID  . simvastatin  40 mg Oral QPM    ASSESSMENT AND PLAN:   1. CAD/NSTEMI: Elevated troponin last week following I & D of necrotic right foot wound. Cardiac cath 06/28/13 with severe three vessel CAD. The best option for revascularization given mult-vessel disease in setting of poorly controlled DM and HTN will be CABG. He has been seen by Dr. Dorris Fetch with CT surgery and f/u is planned in the office in 6-8 weeks to discuss planning for CABG. Continue medical management of CAD  for now with ASA, statin, beta blocker, Ace-inh.   2. Ischemic cardiomyopathy: Medical management. Coronary revascularization with CABG when recovered from BKA.   3. MSSA bacteremia: Reviewed ID recs for TEE to exclude endocarditis. Will plan TEE this am. He is NPO.  I have reviewed the procedure with the patient and he agrees to proceed. BMET and INR pending this am.    MCALHANY,CHRISTOPHER  9/17/20146:58 AM

## 2013-07-03 NOTE — Progress Notes (Signed)
Pt wants CIR-insurance has agreed to CIR-bed available-can admit CIR if medically ready.  [phone]

## 2013-07-04 ENCOUNTER — Inpatient Hospital Stay (HOSPITAL_COMMUNITY): Payer: 59

## 2013-07-04 ENCOUNTER — Other Ambulatory Visit: Payer: Self-pay

## 2013-07-04 ENCOUNTER — Encounter (HOSPITAL_COMMUNITY): Payer: Self-pay | Admitting: Cardiology

## 2013-07-04 ENCOUNTER — Inpatient Hospital Stay (HOSPITAL_COMMUNITY): Payer: 59 | Admitting: *Deleted

## 2013-07-04 DIAGNOSIS — E1165 Type 2 diabetes mellitus with hyperglycemia: Secondary | ICD-10-CM

## 2013-07-04 DIAGNOSIS — I70269 Atherosclerosis of native arteries of extremities with gangrene, unspecified extremity: Secondary | ICD-10-CM

## 2013-07-04 DIAGNOSIS — S88119A Complete traumatic amputation at level between knee and ankle, unspecified lower leg, initial encounter: Secondary | ICD-10-CM

## 2013-07-04 DIAGNOSIS — I2510 Atherosclerotic heart disease of native coronary artery without angina pectoris: Secondary | ICD-10-CM

## 2013-07-04 LAB — GLUCOSE, CAPILLARY
Glucose-Capillary: 236 mg/dL — ABNORMAL HIGH (ref 70–99)
Glucose-Capillary: 255 mg/dL — ABNORMAL HIGH (ref 70–99)

## 2013-07-04 LAB — COMPREHENSIVE METABOLIC PANEL
ALT: 16 U/L (ref 0–53)
Alkaline Phosphatase: 88 U/L (ref 39–117)
BUN: 52 mg/dL — ABNORMAL HIGH (ref 6–23)
CO2: 20 mEq/L (ref 19–32)
GFR calc Af Amer: 50 mL/min — ABNORMAL LOW (ref >90)
GFR calc non Af Amer: 43 mL/min — ABNORMAL LOW (ref >90)
Glucose, Bld: 207 mg/dL — ABNORMAL HIGH (ref 70–99)
Potassium: 4.1 mEq/L (ref 3.5–5.1)
Total Bilirubin: 0.3 mg/dL (ref 0.3–1.2)
Total Protein: 6.4 g/dL (ref 6.0–8.3)

## 2013-07-04 LAB — CBC WITH DIFFERENTIAL/PLATELET
Eosinophils Absolute: 0.1 10*3/uL (ref 0.0–0.7)
Hemoglobin: 8.2 g/dL — ABNORMAL LOW (ref 13.0–17.0)
Lymphocytes Relative: 10 % — ABNORMAL LOW (ref 12–46)
Lymphs Abs: 1.9 10*3/uL (ref 0.7–4.0)
MCH: 26.8 pg (ref 26.0–34.0)
MCV: 79.1 fL (ref 78.0–100.0)
Monocytes Relative: 5 % (ref 3–12)
Neutrophils Relative %: 84 % — ABNORMAL HIGH (ref 43–77)
RBC: 3.06 MIL/uL — ABNORMAL LOW (ref 4.22–5.81)
WBC: 18.9 10*3/uL — ABNORMAL HIGH (ref 4.0–10.5)

## 2013-07-04 MED ORDER — TRAZODONE HCL 50 MG PO TABS
25.0000 mg | ORAL_TABLET | Freq: Every evening | ORAL | Status: DC | PRN
  Administered 2013-07-04 – 2013-07-11 (×4): 50 mg via ORAL
  Filled 2013-07-04 (×5): qty 1

## 2013-07-04 MED ORDER — OXYCODONE HCL 5 MG PO TABS
10.0000 mg | ORAL_TABLET | ORAL | Status: DC | PRN
  Administered 2013-07-04: 5 mg via ORAL
  Administered 2013-07-04 – 2013-07-07 (×7): 10 mg via ORAL
  Filled 2013-07-04 (×8): qty 2

## 2013-07-04 MED ORDER — SODIUM CHLORIDE 0.9 % IJ SOLN
10.0000 mL | INTRAMUSCULAR | Status: DC | PRN
  Administered 2013-07-04 – 2013-07-14 (×15): 10 mL

## 2013-07-04 NOTE — IPOC Note (Signed)
Overall Plan of Care [facility]) Patient Details Name: Albert [name] MRN: [id number] DOB: [birth year]  Admitting Diagnosis: R BKA  Hospital Problems: Principal Problem:   Hx of BKA MI CAD, Endocarditis  uncontrolled diabetes mellitus peripheral neuropathy    Functional Problem List: Nursing    PT Balance;Edema;Endurance;Motor;Pain;Safety;Skin Integrity  OT Balance;Cognition;Endurance;Motor;Pain;Perception;Sensory;Safety  SLP    TR         Basic ADL's: OT Grooming;Dressing;Bathing;Toileting     Advanced  ADL's: OT       Transfers: PT Bed Mobility;Bed to Chair;Car;Furniture  OT Toilet;Tub/Shower     Locomotion: PT Ambulation;Wheelchair Mobility;Stairs     Additional Impairments: OT    SLP        TR      Anticipated Outcomes Item Anticipated Outcome  Self Feeding    Swallowing      Basic self-care  Supervision   Toileting  Mod I for BSC transfer and Supervision for regular toilet transfer    Bathroom Transfers Supervision   Bowel/Bladder     Transfers  Mod I transfers  Locomotion  S wheelchair propulsion; gait goal made for therapy only; no stair goal at this time  Communication     Cognition     Pain     Safety/Judgment      Therapy Plan: PT Intensity: Minimum of 1-2 x/day ,45 to 90 minutes PT Frequency: 5 out of 7 days PT Duration Estimated Length of Stay: 12-14 days OT Intensity: Minimum of 1-2 x/day, 45 to 90 minutes OT Frequency: 5 out of 7 days OT Duration/Estimated Length of Stay: 12-14 days         Team Interventions: Nursing Interventions    PT interventions Ambulation/gait training;Balance/vestibular training;Community reintegration;Discharge planning;Neuromuscular re-education;Functional mobility training;DME/adaptive equipment instruction;Disease management/prevention;Pain management;Patient/family education;Psychosocial support;Skin care/wound management;Splinting/orthotics;UE/LE Coordination activities;UE/LE Strength  taining/ROM;Therapeutic Exercise;Therapeutic Activities;Stair training;Wheelchair propulsion/positioning  OT Interventions Balance/vestibular training;Cognitive remediation/compensation;Community reintegration;Discharge planning;DME/adaptive equipment instruction;Functional mobility training;Patient/family education;Psychosocial support;Self Care/advanced ADL retraining;Therapeutic Exercise;Therapeutic Activities;UE/LE Strength taining/ROM;UE/LE Coordination activities;Wheelchair propulsion/positioning  SLP Interventions    TR Interventions    SW/CM Interventions Discharge Planning;Psychosocial Support;Patient/Family Education    Team Discharge Planning: Destination: PT-Home ,OT- Home , SLP-  Projected Follow-up: PT-Home health PT, OT-  Home health OT;Outpatient OT, SLP-  Projected Equipment Needs: PT-Wheelchair (measurements);Wheelchair cushion (measurements);Rolling walker with 5" wheels;Sliding board, OT-  , SLP-  Patient/family involved in discharge planning: PT- Patient,  OT-Patient, SLP-   MD ELOS: 10-12 days Medical Rehab Prognosis:  Good Assessment: 52 y.o. 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 Admitted 06/25/2013 with nonhealing ulcer to the right foot and Charcot collapse right foot. Underwent irrigation and debridement of right foot plantar abscess 06/25/2013 per Dr. Magnus Ivan. Postoperatively with hypotension and tachycardia. Cardiology services consulted with findings of elevated troponin levels of 10. Echocardiogram with distal septal hypokinesis with overall low-normal LVEF. Elevated troponin levels are likely related to underlying sepsis. Underwent cardiac catheterization 06/28/2013 with findings of triple vessel CAD NSTEMI secondary to demand ischemia and mild LV  systolic dysfunction. Patient placed on medical management with aspirin therapy. Patient with ongoing ischemic changes right lower extremity and underwent right below-knee amputation 06/29/2013 per Dr. Lajoyce Corners. Cardiothoracic surgery followup to consider CABG after recovery from BKA. TEE completed 07/03/2013 any vegetation that was negative with ejection fraction 45-50% .patient currently remains on Ancef therapy per infectious disease for sepsis /MSSA bacteremia with  plan for 28 day duration  Now requiring 24/7 Rehab RN,MD, as well as CIR level PT, OT .  Treatment team will focus on ADLs and mobility with goals set at Sup/Mod I   See Team Conference Notes for weekly updates to the plan of care

## 2013-07-04 NOTE — Progress Notes (Signed)
Occupational Therapy Session Note  Patient Details  Name: Albert [name] MRN: [id number] Date of Birth: October 28, 1960  Today's Date: 07/04/2013 Time: 1400-1430 Time Calculation (min): 30 min  Short Term Goals: Week 1:  OT Short Term Goal 1 (Week 1): Pt will complete LB dressing with mod assist  OT Short Term Goal 2 (Week 1): Pt will complete toilet transfer to drop arm BSC with mod assist  OT Short Term Goal 3 (Week 1): Pt will tolerate standing during 1 ADL with min assist for balance OT Short Term Goal 4 (Week 1): Pt will complete bathing task with min assist standing balance  Skilled Therapeutic Interventions/Progress Updates:  Therapy session focused on core strengthening, UE strengthening, and functional transfers. Pt received supine on mat table following PT session. Pt completed supine>sit with supervision. Engaged in core strengthening activities using weighted ball. Pt required frequent rest breaks after 4-5 reps during exercises. Long rest breaks required d/t fatigue and pt's HR increasing to 105-106. Reqiured 60-90 sec before heart rate would decrease to 97-98. Completed squat pivot transfer to R side mat>w/c with mod assist. Returned to room and completed squat pivot transfer w/c>bed with mod assist with a slight incline up. Pt requesting to doff pants. Completed sit<>stand from bed with mod assist and OT assist with managing clothing off waist. Pt then completed sit>supine at supervision level. Pt left with all items in reach.   Therapy Documentation Precautions:  Precautions Precautions: Fall Precaution Comments: watch HR Restrictions Weight Bearing Restrictions: No RLE Weight Bearing: Non weight bearing Other Position/Activity Restrictions: elevate operated limb, do not prop pillows under knee; try to position on LEFT side so that RIGHT hip can be in neutral/extension General:   Vital Signs: Therapy Vitals Pulse Rate: 98 BP: 107/77 mmHg Patient Position, if appropriate:  Sitting Oxygen Therapy SpO2: 95 % O2 Device: None (Room air) Pulse Oximetry Type: Intermittent Pain: Pt reported 5/10 pain in RLE   Other Treatments:    See FIM for current functional status  Therapy/Group: Individual Therapy  Daneil Dan 07/04/2013, 2:38 PM

## 2013-07-04 NOTE — Evaluation (Signed)
Physical Therapy Assessment and Plan  Patient Details  Name: Albert [name] MRN: [id number] Date of Birth: Sep 25, 1961  PT Diagnosis: Abnormal posture, Abnormality of gait, Coordination disorder, Difficulty walking, Edema, Hemiparesis dominant, Impaired sensation, Muscle weakness and Pain in residual limb Rehab Potential: Good ELOS: 12-14 days   Today's Date: 07/04/2013 Time: 8295-6213 Time Calculation (min): 61 min  Problem List:  Patient Active Problem List   Diagnosis Date Noted  . Hx of BKA 07/04/2013  . Cardiomyopathy, ischemic 07/03/2013  . Coronary atherosclerosis of native coronary artery 07/01/2013  . SEMI (subendocardial myocardial infarction) 06/26/2013  . AKI (acute kidney injury) 06/25/2013  . Foot abscess, right 06/25/2013  . Sepsis 06/25/2013  . Dehydration with hyponatremia 06/25/2013  . Leukocytosis, unspecified 06/25/2013  . Sepsis(995.91) 06/25/2013  . Aftercare following surgery of the circulatory system, NEC 02/11/2013  . Peripheral vascular disease, unspecified 12/31/2012  . Diabetes mellitus out of control 12/17/2012  . Essential hypertension, benign 12/17/2012  . Atherosclerotic peripheral vascular disease 12/17/2012  . Gangrene 12/08/2012  . Hypercholesteremia     Past Medical History:  Past Medical History  Diagnosis Date  . Hypertension   . Diabetes mellitus without complication ~2000    last HbA1c ~9  . Hypercholesteremia   . CVA (cerebral infarction) 2011    R hand deficit  . Chronic sinusitis   . Allergic rhinitis   . Chronic cough   . Complication of anesthesia     couldn't swallow and talk  . Stroke    Past Surgical History:  Past Surgical History  Procedure Laterality Date  . 4th toe amputation Left Jan. 2014    4th toe in [place]  . Tonsillectomy and adenoidectomy    . Penile prosthesis implant    . I&d extremity Left 12/08/2012    Procedure: IRRIGATION AND DEBRIDEMENT EXTREMITY;  Surgeon: Albert Hitch, MD;  Location:  [facility] OR;  Service: Orthopedics;  Laterality: Left;  . Amputation Left 12/08/2012    Procedure: AMPUTATION DIGIT;  Surgeon: Albert Hitch, MD;  Location: [facility] OR;  Service: Orthopedics;  Laterality: Left;  3rd toe amputation possible 5th toe amputation  . I&d extremity Left 12/11/2012    Procedure: REPEAT I&D LEFT FOOT;  Surgeon: Albert Hitch, MD;  Location: [facility] OR;  Service: Orthopedics;  Laterality: Left;  . Amputation Left 12/11/2012    Procedure: Amputation of 2nd Toe;  Surgeon: Albert Hitch, MD;  Location: [facility] OR;  Service: Orthopedics;  Laterality: Left;  . Bypass graft popliteal to tibial Left 12/13/2012    Procedure: BYPASS GRAFT POPLITEAL TO TIBIAL;  Surgeon: Albert Libman, MD;  Location: [facility] OR;  Service: Vascular;  Laterality: Left;  Left Popliteal to Posterior Tibial Bypass Graft with reversed saphenous vein graft  . Incision and drainage Right 06/25/2013    Procedure: INCISION AND DRAINAGE RIGHT FOOT;  Surgeon: Albert Hitch, MD;  Location: [facility] ORS;  Service: Orthopedics;  Laterality: Right;  . Amputation Right 06/29/2013    Procedure: AMPUTATION BELOW KNEE;  Surgeon: Albert Mustard, MD;  Location: [facility] OR;  Service: Orthopedics;  Laterality: Right;  . Tee without cardioversion [name] 07/03/2013    Procedure: TRANSESOPHAGEAL ECHOCARDIOGRAM (TEE);  Surgeon: Albert Morale, MD;  Location: [facility] ENDOSCOPY;  Service: Cardiovascular;  Laterality: [name];    Assessment & Plan Clinical Impression:  Albert [name] is a 52 y.o. right-handed male with history of uncontrolled diabetes mellitus peripheral neuropathy, hypertension, CVA with residual right upper extremity weakness and a Charcot arthropathy  right lower extremity and multiple amputations of toes left foot February 2014. Admitted 06/25/2013 with nonhealing ulcer to the right foot and Charcot collapse right foot. Underwent irrigation and debridement of right foot plantar abscess 06/25/2013 per Dr. Magnus [name]. Postoperatively  with hypotension and tachycardia. Cardiology services consulted with findings of elevated troponin levels of 10. Echocardiogram with distal septal hypokinesis with overall low-normal LVEF. Elevated troponin levels are likely related to underlying sepsis. Underwent cardiac catheterization 06/28/2013 with findings of triple vessel CAD NSTEMI secondary to demand ischemia and mild LV systolic dysfunction. Patient placed on medical management with aspirin therapy. Patient with ongoing ischemic changes right lower extremity and underwent right below-knee amputation 06/29/2013 per Dr. Lajoyce [name]. Cardiothoracic surgery followup to consider CABG after recovery from BKA. TEE completed 07/03/2013 any vegetation that was negative with ejection fraction 45-50% .patient currently remains on Ancef therapy per infectious disease for sepsis /MSSA bacteremia with plan for 28 day duration. Physical therapy evaluation completed 06/30/2013 with recommendations of physical medicine rehabilitation consult to consider inpatient rehabilitation services. Patient was felt to be a good candidate for inpatient rehabilitation services was admitted for comprehensive rehabilitation program today. Patient transferred to CIR on 07/03/2013 .   Patient currently requires max with mobility secondary to muscle weakness, decreased cardiorespiratoy endurance, impaired timing and sequencing, abnormal tone, unbalanced muscle activation and decreased coordination and decreased standing balance, decreased postural control, hemiplegia and decreased balance strategies.  Prior to hospitalization, patient was independent  with mobility and lived with Spouse;Family (wife, mother in law, and 2 kids (20&14)) in a House home.  Home access is One step from garage into car port, then level entry into homeLevel entry;Stairs to enter.  Patient will benefit from skilled PT intervention to maximize safe functional mobility, minimize fall risk and decrease caregiver burden  for planned discharge home with intermittent assist.  Anticipate patient will benefit from follow up [facility] at discharge.  PT - End of Session Activity Tolerance: Tolerates 30+ min activity with multiple rests Endurance Deficit: No PT Assessment Rehab Potential: Good Barriers to Discharge: Inaccessible home environment;Decreased caregiver support PT Patient demonstrates impairments in the following area(s): Balance;Edema;Endurance;Motor;Pain;Safety;Skin Integrity PT Transfers Functional Problem(s): Bed Mobility;Bed to Chair;Car;Furniture PT Locomotion Functional Problem(s): Ambulation;Wheelchair Mobility;Stairs PT Plan PT Intensity: Minimum of 1-2 x/day ,45 to 90 minutes PT Frequency: 5 out of 7 days PT Duration Estimated Length of Stay: 12-14 days PT Treatment/Interventions: Ambulation/gait training;Balance/vestibular training;Community reintegration;Discharge planning;Neuromuscular re-education;Functional mobility training;DME/adaptive equipment instruction;Disease management/prevention;Pain management;Patient/family education;Psychosocial support;Skin care/wound management;Splinting/orthotics;UE/LE Coordination activities;UE/LE Strength taining/ROM;Therapeutic Exercise;Therapeutic Activities;Stair training;Wheelchair propulsion/positioning PT Transfers Anticipated Outcome(s): Mod I transfers PT Locomotion Anticipated Outcome(s): S wheelchair propulsion; gait goal made for therapy only; no stair goal at this time PT Recommendation Recommendations for Other Services: Neuropsych consult Follow Up Recommendations: Home health PT Patient destination: Home Equipment Recommended: Wheelchair (measurements);Wheelchair cushion (measurements);Rolling walker with 5" wheels;Sliding board Equipment Details: Patient owns RW, , and W/C, however unsure if sizing is appropriate  Skilled Therapeutic Intervention Skilled therapeutic intervention initiated after completion of evaluation. Wheelchair switched  from 18x18 to 20x18 to improve patient postural control and increase comfort. Patient educated on desensitization strategies for R phantom limb pain. Patient performed squat pivot/lateral scoot transfers wheelchair<>mat with modA. Patient performed sit<>stand from elevated mat with RW and +2 assist. Patient returned to room and left seated in wheelchair with all needs within reach.  PT Evaluation Precautions/Restrictions Precautions Precautions: Fall Restrictions Weight Bearing Restrictions: No RLE Weight Bearing: Non weight bearing Other Position/Activity Restrictions: elevate operated limb, do not prop  pillows under knee; try to position on LEFT side so that RIGHT hip can be in neutral/extension General Chart Reviewed: Yes Family/Caregiver Present: No  Vital SignsTherapy Vitals Pulse Rate: 98 BP: 107/77 mmHg Patient Position, if appropriate: Sitting Oxygen Therapy SpO2: 95 % O2 Device: None (Room air) Pulse Oximetry Type: Intermittent Pain Pain Assessment Pain Assessment: 0-10 Pain Score: 4  Pain Type: Phantom pain Pain Location: Toe (Comment which one) Pain Orientation: Right Pain Descriptors / Indicators: Aching Pain Frequency: Intermittent Pain Onset: Gradual Patients Stated Pain Goal: 3 Pain Intervention(s): Medication (See eMAR);Repositioned;Elevated extremity;Emotional support Multiple Pain Sites: No Home Living/Prior Functioning Home Living Available Help at Discharge: Available PRN/intermittently Type of Home: House Home Access: Level entry;Stairs to enter Entrance Stairs-Number of Steps: One step from garage into car port, then level entry into home Home Layout: Able to live on main level with bedroom/bathroom;Two level Alternate Level Stairs-Number of Steps: 1 step between living areas Alternate Level Stairs-Rails: None Additional Comments: Patient owns RW, shower chair, and wheelchair.  Lives With: Spouse;Family (wife, mother in law, and 2 kids (20&14)) Prior  Function Level of Independence: Independent with basic ADLs;Independent with transfers;Independent with gait  Able to Take Stairs?: Yes Driving: Yes Vocation Requirements: Full time employment until Jan 2014; currently on leave of absence Vision/Perception  Vision - History Baseline Vision: Wears glasses only for reading Patient Visual Report: No change from baseline  Cognition Overall Cognitive Status: Within Functional Limits for tasks assessed Arousal/Alertness: Awake/alert Orientation Level: Oriented X4 Awareness: Appears intact Sensation Sensation Light Touch: Impaired Detail Light Touch Impaired Details: Impaired LLE;Absent LLE Proprioception: Appears Intact Additional Comments: Proprioception intact at L ankle Coordination Gross Motor Movements are Fluid and Coordinated: No Fine Motor Movements are Fluid and Coordinated: No Coordination and Movement Description: Decreased speed and accuracy with rapid, alternating movements of B UE. R UE unable to perform full supination Motor  Motor Motor: Hemiplegia;Abnormal tone;Abnormal postural alignment and control Motor - Skilled Clinical Observations: R residual deficits from CVA in 2011  Mobility Bed Mobility Bed Mobility: Supine to Sit Supine to Sit: 4: Min assist;HOB flat;With rails Supine to Sit Details: Verbal cues for sequencing;Verbal cues for technique;Verbal cues for precautions/safety;Manual facilitation for weight shifting Transfers Lateral/Scoot Transfers: 3: Mod assist;2: Max assist;With armrests removed Lateral/Scoot Transfer Details: Verbal cues for sequencing;Verbal cues for technique;Verbal cues for precautions/safety;Manual facilitation for weight shifting;Visual cues/gestures for sequencing Locomotion  Ambulation Ambulation: No Ambulation/Gait Assistance: Not tested (comment) Gait Gait: No Stairs / Additional Locomotion Stairs: No Wheelchair Mobility Wheelchair Mobility: Yes Wheelchair Assistance: 3: Mod  assist Wheelchair Assistance Details: Verbal cues for sequencing;Verbal cues for technique;Verbal cues for precautions/safety;Tactile cues for placement Wheelchair Propulsion: Both upper extremities (difficulty secondary to R UE hemiparesis ) Wheelchair Parts Management: Needs assistance Distance: 25  Trunk/Postural Assessment  Cervical Assessment Cervical Assessment: Exceptions to [facility] (forward head posture) Thoracic Assessment Thoracic Assessment: Within Functional Limits Lumbar Assessment Lumbar Assessment: Within Functional Limits Postural Control Postural Control: Deficits on evaluation Postural Limitations: flexed trunk in sitting and standing; slight R lateral trunk lean in standing, likely due to old CVA  Balance Balance Balance Assessed: Yes Static Sitting Balance Static Sitting - Balance Support: No upper extremity supported;Feet supported (L LE supported) Static Sitting - Level of Assistance: 5: Stand by assistance Static Sitting - Comment/# of Minutes: 5-6 minutes Static Standing Balance Static Standing - Balance Support: Bilateral upper extremity supported Static Standing - Level of Assistance: 1: +2 Total assist Static Standing - Comment/# of Minutes: 35" Extremity Assessment  RLE Assessment RLE Assessment: Exceptions to [facility] RLE Strength RLE Overall Strength: Deficits;Due to pain RLE Overall Strength Comments: Grossly 2+/5 hip flexion, knee flexion, knee extension LLE Assessment LLE Assessment: Within Functional Limits (Grossly 4+/5)  FIM:  FIM - Bed/Chair Transfer Bed/Chair Transfer Assistive Devices: Bed rails Bed/Chair Transfer: 4: Supine > Sit: Min A (steadying Pt. > 75%/lift 1 leg);2: Bed > Chair or W/C: Max A (lift and lower assist);3: Chair or W/C > Bed: Mod A (lift or lower assist) FIM - Locomotion: Wheelchair Distance: 25 Locomotion: Wheelchair: 1: Travels less than 50 ft with moderate assistance (Pt: 50 - 74%) FIM - Locomotion:  Ambulation Ambulation/Gait Assistance: Not tested (comment) Locomotion: Ambulation: 0: Activity did not occur FIM - Locomotion: Stairs Locomotion: Stairs: 0: Activity did not occur   Refer to Care Plan for Long Term Goals  Recommendations for other services: Neuropsych  Discharge Criteria: Patient will be discharged from PT if patient refuses treatment 3 consecutive times without medical reason, if treatment goals not met, if there is a change in medical status, if patient makes no progress towards goals or if patient is discharged from hospital.  The above assessment, treatment plan, treatment alternatives and goals were discussed and mutually agreed upon: by patient  Chipper Herb. Raevyn Sokol, PT, DPT 07/04/2013, 10:59 AM

## 2013-07-04 NOTE — Progress Notes (Signed)
Occupational Therapy Assessment and Plan  Patient Details  Name: Albert [name] MRN: [id number] Date of Birth: 1960-11-08  OT Diagnosis: hemiplegia affecting dominant side, muscle weakness (generalized) and pain in residual limb Rehab Potential:  good  ELOS:   12-14 days  Today's Date: 07/04/2013 Time: 0752-0900 Time Calculation (min): 68 min  Problem List:  Patient Active Problem List   Diagnosis Date Noted  . Hx of BKA 07/04/2013  . Cardiomyopathy, ischemic 07/03/2013  . Coronary atherosclerosis of native coronary artery 07/01/2013  . SEMI (subendocardial myocardial infarction) 06/26/2013  . AKI (acute kidney injury) 06/25/2013  . Foot abscess, right 06/25/2013  . Sepsis 06/25/2013  . Dehydration with hyponatremia 06/25/2013  . Leukocytosis, unspecified 06/25/2013  . Sepsis(995.91) 06/25/2013  . Aftercare following surgery of the circulatory system, NEC 02/11/2013  . Peripheral vascular disease, unspecified 12/31/2012  . Diabetes mellitus out of control 12/17/2012  . Essential hypertension, benign 12/17/2012  . Atherosclerotic peripheral vascular disease 12/17/2012  . Gangrene 12/08/2012  . Hypercholesteremia     Past Medical History:  Past Medical History  Diagnosis Date  . Hypertension   . Diabetes mellitus without complication ~2000    last HbA1c ~9  . Hypercholesteremia   . CVA (cerebral infarction) 2011    R hand deficit  . Chronic sinusitis   . Allergic rhinitis   . Chronic cough   . Complication of anesthesia     couldn't swallow and talk  . Stroke    Past Surgical History:  Past Surgical History  Procedure Laterality Date  . 4th toe amputation Left Jan. 2014    4th toe in [place]  . Tonsillectomy and adenoidectomy    . Penile prosthesis implant    . I&d extremity Left 12/08/2012    Procedure: IRRIGATION AND DEBRIDEMENT EXTREMITY;  Surgeon: Kathryne Hitch, MD;  Location: [facility] OR;  Service: Orthopedics;  Laterality: Left;  . Amputation Left  12/08/2012    Procedure: AMPUTATION DIGIT;  Surgeon: Kathryne Hitch, MD;  Location: [facility] OR;  Service: Orthopedics;  Laterality: Left;  3rd toe amputation possible 5th toe amputation  . I&d extremity Left 12/11/2012    Procedure: REPEAT I&D LEFT FOOT;  Surgeon: Kathryne Hitch, MD;  Location: [facility] OR;  Service: Orthopedics;  Laterality: Left;  . Amputation Left 12/11/2012    Procedure: Amputation of 2nd Toe;  Surgeon: Kathryne Hitch, MD;  Location: [facility] OR;  Service: Orthopedics;  Laterality: Left;  . Bypass graft popliteal to tibial Left 12/13/2012    Procedure: BYPASS GRAFT POPLITEAL TO TIBIAL;  Surgeon: Nada Libman, MD;  Location: [facility] OR;  Service: Vascular;  Laterality: Left;  Left Popliteal to Posterior Tibial Bypass Graft with reversed saphenous vein graft  . Incision and drainage Right 06/25/2013    Procedure: INCISION AND DRAINAGE RIGHT FOOT;  Surgeon: Kathryne Hitch, MD;  Location: [facility] ORS;  Service: Orthopedics;  Laterality: Right;  . Amputation Right 06/29/2013    Procedure: AMPUTATION BELOW KNEE;  Surgeon: Nadara Mustard, MD;  Location: [facility] OR;  Service: Orthopedics;  Laterality: Right;  . Tee without cardioversion [name] 07/03/2013    Procedure: TRANSESOPHAGEAL ECHOCARDIOGRAM (TEE);  Surgeon: Laurey Morale, MD;  Location: [facility] ENDOSCOPY;  Service: Cardiovascular;  Laterality: [name];    Assessment & Plan Clinical Impression: Patient is a 52 y.o. year old male with history of uncontrolled diabetes mellitus peripheral neuropathy, hypertension, CVA with residual right upper extremity weakness and a Charcot arthropathy right lower extremity and multiple amputations of toes  left foot February 2014. Admitted 06/25/2013 with nonhealing ulcer to the right foot and Charcot collapse right foot. Underwent irrigation and debridement of right foot plantar abscess 06/25/2013 per Dr. Magnus Ivan. Postoperatively with hypotension and tachycardia. Cardiology services consulted with findings of  elevated troponin levels of 10. Echocardiogram with distal septal hypokinesis with overall low-normal LVEF. Elevated troponin levels are likely related to underlying sepsis. Underwent cardiac catheterization 06/28/2013 with findings of triple vessel CAD NSTEMI secondary to demand ischemia and mild LV systolic dysfunction. Patient placed on medical management with aspirin therapy. Patient with ongoing ischemic changes right lower extremity and underwent right below-knee amputation 06/29/2013 per Dr. Lajoyce Corners. Cardiothoracic surgery followup to consider CABG after recovery from BKA. TEE completed 07/03/2013 any vegetation that was negative with ejection fraction 45-50% .patient currently remains on Ancef therapy per infectious disease for sepsis /MSSA bacteremia with plan for 28 day duration. Physical therapy evaluation completed 06/30/2013 with recommendations of physical medicine rehabilitation consult to consider inpatient rehabilitation services. Patient was felt to be a good candidate for inpatient rehabilitation services was admitted for comprehensive rehabilitation program today.  Patient transferred to CIR on 07/03/2013 .    Patient currently requires total with basic self-care skills secondary to muscle weakness, decreased cardiorespiratoy endurance and decreased motor planning, decreased postural control, decreased standing balance, decreased coordination. Prior to hospitalization, patient could complete BADLs with modified independent . Patient was living at home with wife and 2 sons. Wife works 7PM-7AM. Patient reports he was only completing sponges baths PTA.  Patient will benefit from skilled intervention to increase independence with basic self-care skills prior to discharge home with care partner.  Anticipate patient will require intermittent supervision and follow up home health.      Skilled Therapeutic Intervention Therapy session focused on functional transfers, dynamic standing balance, and  lateral scooting during ADL retraining. Pt requesting to go to bathroom during eval. Completed squat pivot transfer to L side with +2 assist (max from one therapist and min from another). Pt completed sit<>stand from toilet with mod assist for balance with anterior and lateral weight shift to R while other staff member completed hygiene. Transferred back to bed to R side via squat pivot with +2 assist (max from therapist and mod from other staff member). Pt very fearful of falling and anxious about transfers which impacted amount of assist. Completed lateral scoots in bed with mod assist going to R side. Engaged in dressing requiring +2 assist for managing pants around waist secondary to them being slightly too small. Completed sit<>stands during LB dressing with min-mod assist and mod assist standing balance while therapist able to manage undergarments around waist. Pt reports feeling like he's falling to R side during sit<>stands even without lateral lean. Encouraged pt to have wife bring in shorts to wear with elastic waist to make LB dressing easier and pt agreed. Pt with c/o phantom pain throughout session however would not rate it. HR slightly elevated throughout session and frequent rest breaks taken. Pt with poor frustration tolerance during therapy session. Pt left with call light and all items in reach while supine in bed.   OT Evaluation Precautions/Restrictions  Precautions Precautions: Fall Restrictions Weight Bearing Restrictions: No Other Position/Activity Restrictions: elevate operated limb, do not prop pillows under knee; try to position on LEFT side so that RIGHT hip can be in neutral/extension General   Vital Signs Therapy Vitals Temp: 97.6 F (36.4 C) Temp src: Oral Pulse Rate: 104 Resp: 18 BP: 127/84 mmHg Patient Position, if appropriate:  Sitting Oxygen Therapy SpO2: 98 % O2 Device: None (Room air) Pain   Home Living/Prior Functioning Home Living Available Help at  Discharge: Available PRN/intermittently Type of Home: House Home Access: Level entry Home Layout: Two level Alternate Level Stairs-Number of Steps: 1 step between living areas Alternate Level Stairs-Rails: None  Lives With: Spouse ADL   Vision/Perception     Geophysicist/field seismologist     Trunk/Postural Assessment     Balance   Extremity/Trunk Assessment      FIM:      Refer to Care Plan for Long Term Goals  Recommendations for other services: None  Discharge Criteria: Patient will be discharged from OT if patient refuses treatment 3 consecutive times without medical reason, if treatment goals not met, if there is a change in medical status, if patient makes no progress towards goals or if patient is discharged from hospital.  The above assessment, treatment plan, treatment alternatives and goals were discussed and mutually agreed upon: by patient  Daneil Dan 07/04/2013, 9:28 AM

## 2013-07-04 NOTE — Progress Notes (Signed)
Social Work Assessment and Plan Social Work Assessment and Plan  Patient Details  Name: Albert [name] MRN: [id number] Date of Birth: Jul 18, 1961  Today's Date: 07/04/2013  Problem List:  Patient Active Problem List   Diagnosis Date Noted  . Hx of BKA 07/04/2013  . Cardiomyopathy, ischemic 07/03/2013  . Coronary atherosclerosis of native coronary artery 07/01/2013  . SEMI (subendocardial myocardial infarction) 06/26/2013  . AKI (acute kidney injury) 06/25/2013  . Foot abscess, right 06/25/2013  . Sepsis 06/25/2013  . Dehydration with hyponatremia 06/25/2013  . Leukocytosis, unspecified 06/25/2013  . Sepsis(995.91) 06/25/2013  . Aftercare following surgery of the circulatory system, NEC 02/11/2013  . Peripheral vascular disease, unspecified 12/31/2012  . Diabetes mellitus out of control 12/17/2012  . Essential hypertension, benign 12/17/2012  . Atherosclerotic peripheral vascular disease 12/17/2012  . Gangrene 12/08/2012  . Hypercholesteremia    Past Medical History:  Past Medical History  Diagnosis Date  . Hypertension   . Diabetes mellitus without complication ~2000    last HbA1c ~9  . Hypercholesteremia   . CVA (cerebral infarction) 2011    R hand deficit  . Chronic sinusitis   . Allergic rhinitis   . Chronic cough   . Complication of anesthesia     couldn't swallow and talk  . Stroke    Past Surgical History:  Past Surgical History  Procedure Laterality Date  . 4th toe amputation Left Jan. 2014    4th toe in [place]  . Tonsillectomy and adenoidectomy    . Penile prosthesis implant    . I&d extremity Left 12/08/2012    Procedure: IRRIGATION AND DEBRIDEMENT EXTREMITY;  Surgeon: Kathryne Hitch, MD;  Location: [facility] OR;  Service: Orthopedics;  Laterality: Left;  . Amputation Left 12/08/2012    Procedure: AMPUTATION DIGIT;  Surgeon: Kathryne Hitch, MD;  Location: [facility] OR;  Service: Orthopedics;  Laterality: Left;  3rd toe amputation possible 5th toe  amputation  . I&d extremity Left 12/11/2012    Procedure: REPEAT I&D LEFT FOOT;  Surgeon: Kathryne Hitch, MD;  Location: [facility] OR;  Service: Orthopedics;  Laterality: Left;  . Amputation Left 12/11/2012    Procedure: Amputation of 2nd Toe;  Surgeon: Kathryne Hitch, MD;  Location: [facility] OR;  Service: Orthopedics;  Laterality: Left;  . Bypass graft popliteal to tibial Left 12/13/2012    Procedure: BYPASS GRAFT POPLITEAL TO TIBIAL;  Surgeon: Nada Libman, MD;  Location: [facility] OR;  Service: Vascular;  Laterality: Left;  Left Popliteal to Posterior Tibial Bypass Graft with reversed saphenous vein graft  . Incision and drainage Right 06/25/2013    Procedure: INCISION AND DRAINAGE RIGHT FOOT;  Surgeon: Kathryne Hitch, MD;  Location: [facility] ORS;  Service: Orthopedics;  Laterality: Right;  . Amputation Right 06/29/2013    Procedure: AMPUTATION BELOW KNEE;  Surgeon: Nadara Mustard, MD;  Location: [facility] OR;  Service: Orthopedics;  Laterality: Right;  . Tee without cardioversion [name] 07/03/2013    Procedure: TRANSESOPHAGEAL ECHOCARDIOGRAM (TEE);  Surgeon: Laurey Morale, MD;  Location: [facility] ENDOSCOPY;  Service: Cardiovascular;  Laterality: [name];   Social History:  reports that he has never smoked. He has never used smokeless tobacco. He reports that he does not drink alcohol or use illicit drugs.  Family / Support Systems Marital Status: Married Patient Roles: Spouse;Parent;Other (Comment) (Employee) Spouse/Significant Other: [name]  [phone] Children: Two teenage boys-20 & 87 yo Anticipated Caregiver: Wife and son's Ability/Limitations of Caregiver: Son is there when wif eis working.  Wife works  7a-7p at [employer] Caregiver Availability: 24/7 Family Dynamics: Close knit family who are willing to hlep one another.  Pt has not needed assistance and prides himeself on this, but will need some assist now.  He is learning to deal with this but it is not easy.  Social History Preferred  language: English Religion: Christian Cultural Background: No issues Education: High School Read: Yes Write: Yes Employment Status: Unemployed Date Retired/Disabled/Unemployed: feb 2014-applying for [employer] Legal Hisotry/Current Legal Issues: No issues Guardian/Conservator: None-according to MD pt is capable of making his own decisions while here.   Abuse/Neglect Physical Abuse: Denies Verbal Abuse: Denies Sexual Abuse: Denies Exploitation of patient/patient's resources: Denies Self-Neglect: Denies  Emotional Status Pt's affect, behavior adn adjustment status: Pt is motivated and wants to get as independent as possible, he wants to ask as little as he can of his family.  He reports: " I was fully functional before this."  He is pleased with how well he is doing today. Recent Psychosocial Issues: other medical issues and applying for SSD Pyschiatric History: No history-deferred depression screen at this time due to he feels ok at this time.  Will ask Neuro-psych tl see and provide assist with coping while here.  Pt has experienced much in a short period of time. Substance Abuse History: No issues  Patient / Family Perceptions, Expectations & Goals Pt/Family understanding of illness & functional limitations: Pt and son are able to explain his diagnosis and his treatment plan.  Son will come up along with wife to learn his care prior to discharge.  Both glad pt is on rehab. Premorbid pt/family roles/activities: Husband, Father, Retiree, Church member, etc Anticipated changes in roles/activities/participation: resume Pt/family expectations/goals: Pt states: " I want to do as much as I can for myself, before I leave."  Son states: " He has been through a lot with all of this."  [employer]: Other (Comment) (Wound Clinic in [place]) Premorbid Home Care/DME Agencies: None Transportation available at discharge: Family Resource referrals recommended: Support group  (specify) ([facility] Support group)  Discharge Planning Living Arrangements: Spouse/significant other;Children Support Systems: Spouse/significant other;Children;Friends/neighbors;Church/faith community Type of Residence: Private residence Insurance Resources: Media planner (specify) Education officer, museum) Financial Resources: Family Support;Other (Comment) (Long term Dis through work) Surveyor, quantity Screen Referred: Yes Living Expenses: Lives with family Money Management: Spouse;Patient Does the patient have any problems obtaining your medications?: No Home Management: Wife Patient/Family Preliminary Plans: Return home with wife and son's who all can assist.  Wife works 7a-7p and son's are there when she is not.  Pt will always have someone with him.  Pt hopes he will not need mushc assist at discharge. Social Work Anticipated Follow Up Needs: HH/OP;Support Group  Clinical Impression Pleasant gentleman who is motivated to improve and regain his independence and learn other ways to adapt with his deficits.  Will assist with SSD application while here. Supportive family who are willing to assist him at discharge.  Will get family involved prior to discharge with his therapies.  Lucy Chris 07/04/2013, 1:07 PM

## 2013-07-04 NOTE — Progress Notes (Signed)
Patient ID: Albert [name], male   DOB: 1961/04/17, 52 y.o.   MRN: [id number] Subjective/Complaints: 52 y.o. 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 Admitted 06/25/2013 with nonhealing ulcer to the right foot and Charcot collapse right foot. Underwent irrigation and debridement of right foot plantar abscess 06/25/2013 per Dr. Magnus Ivan. Postoperatively with hypotension and tachycardia. Cardiology services consulted with findings of elevated troponin levels of 10. Echocardiogram with distal septal hypokinesis with overall low-normal LVEF. Elevated troponin levels are likely related to underlying sepsis. Underwent cardiac catheterization 06/28/2013 with findings of triple vessel CAD NSTEMI secondary to demand ischemia and mild LV systolic dysfunction. Patient placed on medical management with aspirin therapy. Patient with ongoing ischemic changes right lower extremity and underwent right below-knee amputation 06/29/2013 per Dr. Lajoyce Corners. Cardiothoracic surgery followup to consider CABG after recovery from BKA. TEE completed 07/03/2013 any vegetation that was negative with ejection fraction 45-50% .patient currently remains on Ancef therapy per infectious disease for sepsis /MSSA bacteremia with plan for 28 day duration  Poor sleep No pain c/o  Review of Systems  Constitutional: Negative for fever and chills.  Gastrointestinal: Positive for constipation.  All other systems reviewed and are negative.    Objective: Vital Signs: Blood pressure 127/84, pulse 104, temperature 97.6 F (36.4 C), temperature source Oral, resp. rate 18, height 5' 10.87" (1.8 m), weight 121.6 kg (268 lb 1.3 oz), SpO2 98.00%. No results found. Results for orders placed during the hospital encounter of 07/03/13 (from the  past 72 hour(s))  GLUCOSE, CAPILLARY     Status: Abnormal   Collection Time    07/03/13  5:54 PM      Result Value Range   Glucose-Capillary 201 (*) 70 - 99 mg/dL  GLUCOSE, CAPILLARY     Status: Abnormal   Collection Time    07/03/13  9:46 PM      Result Value Range   Glucose-Capillary 228 (*) 70 - 99 mg/dL   Comment 1 Notify RN    CBC WITH DIFFERENTIAL     Status: Abnormal   Collection Time    07/04/13  5:00 AM      Result Value Range   WBC 18.9 (*) 4.0 - 10.5 K/uL   RBC 3.06 (*) 4.22 - 5.81 MIL/uL   Hemoglobin 8.2 (*) 13.0 - 17.0 g/dL   HCT 13.0 (*) 86.5 - 78.4 %   MCV 79.1  78.0 - 100.0 fL   MCH 26.8  26.0 - 34.0 pg   MCHC 33.9  30.0 - 36.0 g/dL   RDW 69.6  29.5 - 28.4 %   Platelets 548 (*) 150 - 400 K/uL   Neutrophils Relative % 84 (*) 43 - 77 %   Neutro Abs 15.9 (*) 1.7 - 7.7 K/uL   Lymphocytes Relative 10 (*) 12 - 46 %   Lymphs Abs 1.9  0.7 - 4.0 K/uL   Monocytes Relative 5  3 - 12 %   Monocytes Absolute 1.0  0.1 - 1.0 K/uL   Eosinophils Relative 0  0 - 5 %   Eosinophils Absolute 0.1  0.0 - 0.7 K/uL   Basophils Relative 0  0 - 1 %   Basophils Absolute 0.0  0.0 - 0.1 K/uL  COMPREHENSIVE METABOLIC PANEL     Status: Abnormal   Collection Time    07/04/13  5:00 AM  Result Value Range   Sodium 127 (*) 135 - 145 mEq/L   Potassium 4.1  3.5 - 5.1 mEq/L   Chloride 94 (*) 96 - 112 mEq/L   CO2 20  19 - 32 mEq/L   Glucose, Bld 207 (*) 70 - 99 mg/dL   BUN 52 (*) 6 - 23 mg/dL   Creatinine, Ser 1.61 (*) 0.50 - 1.35 mg/dL   Calcium 7.8 (*) 8.4 - 10.5 mg/dL   Total Protein 6.4  6.0 - 8.3 g/dL   Albumin 2.2 (*) 3.5 - 5.2 g/dL   AST 34  0 - 37 U/L   ALT 16  0 - 53 U/L   Alkaline Phosphatase 88  39 - 117 U/L   Total Bilirubin 0.3  0.3 - 1.2 mg/dL   GFR calc non Af Amer 43 (*) >90 mL/min   GFR calc Af Amer 50 (*) >90 mL/min   Comment: (NOTE)     The eGFR has been calculated using the CKD EPI equation.     This calculation has not been validated in all clinical  situations.     eGFR's persistently <90 mL/min signify possible Chronic Kidney     Disease.  GLUCOSE, CAPILLARY     Status: Abnormal   Collection Time    07/04/13  7:33 AM      Result Value Range   Glucose-Capillary 192 (*) 70 - 99 mg/dL   Comment 1 Notify RN       HEENT: normal Cardio: RRR and murmur Resp: CTA B/L and unlabored GI: BS positive Extremity:  Pulses positive and No Edema Skin:   Breakdown Left distal MCP #3 , 2cm oval with granulation tissue but foul odor and Wound C/D/I Neuro: Alert/Oriented, Cranial Nerve II-XII normal, Normal Sensory and Abnormal Motor 3-/5 ankle DF on left 4/5 bilateral hip flex, 5/5 B Delt,bi,tri,grip Musc/Skel:  Normal Gen: NAD   Assessment/Plan: 1. Functional deficits secondary to R BKA secondary to gangrene which require 3+ hours per day of interdisciplinary therapy in a comprehensive inpatient rehab setting. Physiatrist is providing close team supervision and 24 hour management of active medical problems listed below. Physiatrist and rehab team continue to assess barriers to discharge/monitor patient progress toward functional and medical goals. FIM:                   Comprehension Comprehension Mode: Auditory Comprehension: 7-Follows complex conversation/direction: With no assist  Expression Expression Mode: Verbal Expression: 7-Expresses complex ideas: With no assist  Social Interaction Social Interaction: 7-Interacts appropriately with others - No medications needed.  Problem Solving Problem Solving: 7-Solves complex problems: Recognizes & self-corrects  Memory Memory: 7-Complete Independence: No helper  Medical Problem List and Plan:  1. Right BKA due to a Charcot foot/abscess, prior left CVA with residual right-sided weakness  2. DVT Prophylaxis/Anticoagulation: SCDs left lower extremity.  3. Pain Management: Lyrica 75 mg twice a day, Oxycodone as needed. Monitor with increased mobility  4. Neuropsych: This  patient is capable of making decisions on his own behalf.  5. CAD/NSTEMI. Continue aspirin therapy. Plan followup cardiothoracic surgery 6-8 weeks to consider CABG. Patient had chest pain or shortness of breath  6. ID. Sepsis/MSSA bacteremia. Continue Ancef as directed x28 days total initiated 06/25/2013.  7. Diabetes mellitus with peripheral neuropathy. Hemoglobin A1c 8.2. Levemir 25 units daily, NovoLog 6 units 3 times a day. Check blood sugars a.c. and at bedtime  8. Hypertension. Lopressor 50 mg twice a day. Monitor with increased mobility  9. Acute  blood loss anemia. Latest hemoglobin 10.8. Followup CBC  10. Recent multi-toe amputation left foot. Continue wound care as advised. Monitor for increased ischemic changes. Need to be careful with transfers or any attempts with gait. Followup vascular surgery  11.  Peripheral edema ACE wrap, no diuretic due to hyponatremia    LOS (Days) 1 A FACE TO FACE EVALUATION WAS PERFORMED  KIRSTEINS,ANDREW E 07/04/2013, 7:53 AM

## 2013-07-04 NOTE — Care Management Note (Signed)
[facility] Individual Statement of Services  Patient Name:  Albert [name]  Date:  07/04/2013  Welcome to the [facility]patient [facility].  Our goal is to provide you with an individualized program based on your diagnosis and situation, designed to meet your specific needs.  With this comprehensive rehabilitation program, you will be expected to participate in at least 3 hours of rehabilitation therapies Monday-Friday, with modified therapy programming on the weekends.  Your rehabilitation program will include the following services:  Physical Therapy (PT), Occupational Therapy (OT), 24 hour per day rehabilitation nursing, Neuropsychology, Case Management (Social Worker), Rehabilitation Medicine, Nutrition Services and Pharmacy Services  Weekly team conferences will be held on Wednesday to discuss your progress.  Your Social Worker will talk with you frequently to get your input and to update you on team discussions.  Team conferences with you and your family in attendance may also be held.  Expected length of stay: 12-14 days Overall anticipated outcome: mod/i-min level  Depending on your progress and recovery, your program may change. Your Social Worker will coordinate services and will keep you informed of any changes. Your Social Worker's name and contact numbers are listed  below.  The following services may also be recommended but are not provided by the Inpatient [facility] Center:   Driving Evaluations  Home Health Rehabiltiation Services  Outpatient Rehabilitation Service   Arrangements will be made to provide these services after discharge if needed.  Arrangements include referral to agencies that provide these services.  Your insurance has been verified to be:  [facility] Your primary doctor is:  Dr. Melvenia Needles  Pertinent information will be shared with your doctor and your insurance company.  Social Worker:  Dossie Der, SW [phone] or ([phone]  Information discussed with and copy given to patient by: Lucy Chris, 07/04/2013, 11:35 AM

## 2013-07-04 NOTE — Progress Notes (Signed)
Physical Therapy Session Note  Patient Details  Name: Albert [name] MRN: [id number] Date of Birth: December 27, 1960  Today's Date: 07/04/2013 Time: 4098-1191 Time Calculation (min): 45 min  Short Term Goals: Week 1:  PT Short Term Goal 1 (Week 1): Patient will perform bed mobility from flat surface without use of bedrails with supervision. PT Short Term Goal 2 (Week 1): Patient will perform bed<>chair transfers to bilateral sides with minA. PT Short Term Goal 3 (Week 1): Patient will perform sit<>stand transfer with RW with maxA x1. PT Short Term Goal 4 (Week 1): Patient will perform wheelchair mobility x50' with minA.  Skilled Therapeutic Interventions/Progress Updates:   Patient received sitting in wheelchair. Session focused on functional transfers, wheelchair mobility and LE strengthening exercises. Patient performed lateral/scoot transfer wheelchair to mat (going to L side) with modA. Patient performed LE strengthening exercises sitting edge of mat: LAQ with 5" hold and marches x20 reps each. Patient transfers sit>supine on flat mat without rails with supervision. Patient performed supine LE therex: quad sets with 5" hold, SLRs, L ankle pumps, L bridging, hip abd, L heel slides/knee flexion, R knee flexion; all x20 reps. Patient left supine on mat, awaiting OT session.   Therapy Documentation Precautions:  Precautions Precautions: Fall Precaution Comments: watch HR Restrictions Weight Bearing Restrictions: No RLE Weight Bearing: Non weight bearing Other Position/Activity Restrictions: elevate operated limb, do not prop pillows under knee; try to position on LEFT side so that RIGHT hip can be in neutral/extension Pain: Pain Assessment Pain Assessment: 0-10 Pain Score: 5  Pain Type: Surgical pain Pain Location: Knee Pain Orientation: Right Multiple Pain Sites: No Locomotion : Ambulation Ambulation: No Ambulation/Gait Assistance: Not tested (comment) Gait Gait: No Stairs /  Additional Locomotion Stairs: No Wheelchair Mobility Wheelchair Mobility: Yes Wheelchair Assistance: 3: Mod assist Wheelchair Assistance Details: Verbal cues for sequencing;Verbal cues for technique;Verbal cues for precautions/safety;Tactile cues for placement Wheelchair Propulsion: Both upper extremities Wheelchair Parts Management: Needs assistance Distance: 35   See FIM for current functional status  Therapy/Group: Individual Therapy  Chipper Herb. Merica Prell, PT, DPT 07/04/2013, 1:45 PM

## 2013-07-04 NOTE — Plan of Care (Signed)
Problem: RH Bathing Goal: LTG Patient will bathe with assist, cues/equipment (OT) LTG: Patient will bathe specified number of body parts with assist with/without cues using equipment (position) (OT) Pt completes sponge bath only

## 2013-07-04 NOTE — Progress Notes (Signed)
Patient information reviewed and entered into [facility] by Becky Gregoire Bennis, covering PPS coordinator. Information including medical coding and functional independence measure will be reviewed and updated through discharge.   

## 2013-07-05 ENCOUNTER — Inpatient Hospital Stay (HOSPITAL_COMMUNITY): Payer: 59 | Admitting: *Deleted

## 2013-07-05 LAB — CULTURE, BLOOD (ROUTINE X 2): Culture: NO GROWTH

## 2013-07-05 LAB — GLUCOSE, CAPILLARY

## 2013-07-05 MED ORDER — INSULIN ASPART 100 UNIT/ML ~~LOC~~ SOLN
8.0000 [IU] | Freq: Three times a day (TID) | SUBCUTANEOUS | Status: DC
  Administered 2013-07-05 – 2013-07-14 (×28): 8 [IU] via SUBCUTANEOUS

## 2013-07-05 MED ORDER — INSULIN DETEMIR 100 UNIT/ML ~~LOC~~ SOLN
30.0000 [IU] | Freq: Every day | SUBCUTANEOUS | Status: DC
  Administered 2013-07-06 – 2013-07-07 (×2): 30 [IU] via SUBCUTANEOUS
  Filled 2013-07-05 (×2): qty 0.3

## 2013-07-05 NOTE — Progress Notes (Signed)
Patient ID: Albert [name], male   DOB: 04-Oct-1961, 52 y.o.   MRN: [id number] Subjective/Complaints: 52 y.o. 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 Admitted 06/25/2013 with nonhealing ulcer to the right foot and Charcot collapse right foot. Underwent irrigation and debridement of right foot plantar abscess 06/25/2013 per Dr. Magnus Ivan. Postoperatively with hypotension and tachycardia. Cardiology services consulted with findings of elevated troponin levels of 10. Echocardiogram with distal septal hypokinesis with overall low-normal LVEF. Elevated troponin levels are likely related to underlying sepsis. Underwent cardiac catheterization 06/28/2013 with findings of triple vessel CAD NSTEMI secondary to demand ischemia and mild LV systolic dysfunction. Patient placed on medical management with aspirin therapy. Patient with ongoing ischemic changes right lower extremity and underwent right below-knee amputation 06/29/2013 per Dr. Lajoyce Corners. Cardiothoracic surgery followup to consider CABG after recovery from BKA. TEE completed 07/03/2013 any vegetation that was negative with ejection fraction 45-50% .patient currently remains on Ancef therapy per infectious disease for sepsis /MSSA bacteremia with plan for 28 day duration  Fitted with shrinker.  LLE ACE wrapped  Review of Systems  Constitutional: Negative for fever and chills.  Gastrointestinal: Positive for constipation.  All other systems reviewed and are negative.    Objective: Vital Signs: Blood pressure 133/90, pulse 97, temperature 98 F (36.7 C), temperature source Oral, resp. rate 19, height 5' 10.87" (1.8 m), weight 121.6 kg (268 lb 1.3 oz), SpO2 97.00%. No results found. Results for orders placed during the hospital encounter of  07/03/13 (from the past 72 hour(s))  GLUCOSE, CAPILLARY     Status: Abnormal   Collection Time    07/03/13  5:54 PM      Result Value Range   Glucose-Capillary 201 (*) 70 - 99 mg/dL  GLUCOSE, CAPILLARY     Status: Abnormal   Collection Time    07/03/13  9:46 PM      Result Value Range   Glucose-Capillary 228 (*) 70 - 99 mg/dL   Comment 1 Notify RN    CBC WITH DIFFERENTIAL     Status: Abnormal   Collection Time    07/04/13  5:00 AM      Result Value Range   WBC 18.9 (*) 4.0 - 10.5 K/uL   RBC 3.06 (*) 4.22 - 5.81 MIL/uL   Hemoglobin 8.2 (*) 13.0 - 17.0 g/dL   HCT 46.9 (*) 62.9 - 52.8 %   MCV 79.1  78.0 - 100.0 fL   MCH 26.8  26.0 - 34.0 pg   MCHC 33.9  30.0 - 36.0 g/dL   RDW 41.3  24.4 - 01.0 %   Platelets 548 (*) 150 - 400 K/uL   Neutrophils Relative % 84 (*) 43 - 77 %   Neutro Abs 15.9 (*) 1.7 - 7.7 K/uL   Lymphocytes Relative 10 (*) 12 - 46 %   Lymphs Abs 1.9  0.7 - 4.0 K/uL   Monocytes Relative 5  3 - 12 %   Monocytes Absolute 1.0  0.1 - 1.0 K/uL   Eosinophils Relative 0  0 - 5 %   Eosinophils Absolute 0.1  0.0 - 0.7 K/uL   Basophils Relative 0  0 - 1 %   Basophils Absolute 0.0  0.0 - 0.1 K/uL  COMPREHENSIVE METABOLIC PANEL     Status: Abnormal   Collection Time    07/04/13  5:00  AM      Result Value Range   Sodium 127 (*) 135 - 145 mEq/L   Potassium 4.1  3.5 - 5.1 mEq/L   Chloride 94 (*) 96 - 112 mEq/L   CO2 20  19 - 32 mEq/L   Glucose, Bld 207 (*) 70 - 99 mg/dL   BUN 52 (*) 6 - 23 mg/dL   Creatinine, Ser 4.78 (*) 0.50 - 1.35 mg/dL   Calcium 7.8 (*) 8.4 - 10.5 mg/dL   Total Protein 6.4  6.0 - 8.3 g/dL   Albumin 2.2 (*) 3.5 - 5.2 g/dL   AST 34  0 - 37 U/L   ALT 16  0 - 53 U/L   Alkaline Phosphatase 88  39 - 117 U/L   Total Bilirubin 0.3  0.3 - 1.2 mg/dL   GFR calc non Af Amer 43 (*) >90 mL/min   GFR calc Af Amer 50 (*) >90 mL/min   Comment: (NOTE)     The eGFR has been calculated using the CKD EPI equation.     This calculation has not been validated in all  clinical situations.     eGFR's persistently <90 mL/min signify possible Chronic Kidney     Disease.  GLUCOSE, CAPILLARY     Status: Abnormal   Collection Time    07/04/13  7:33 AM      Result Value Range   Glucose-Capillary 192 (*) 70 - 99 mg/dL   Comment 1 Notify RN    GLUCOSE, CAPILLARY     Status: Abnormal   Collection Time    07/04/13 11:29 AM      Result Value Range   Glucose-Capillary 236 (*) 70 - 99 mg/dL   Comment 1 Notify RN    GLUCOSE, CAPILLARY     Status: Abnormal   Collection Time    07/04/13  4:19 PM      Result Value Range   Glucose-Capillary 236 (*) 70 - 99 mg/dL  GLUCOSE, CAPILLARY     Status: Abnormal   Collection Time    07/04/13  9:00 PM      Result Value Range   Glucose-Capillary 255 (*) 70 - 99 mg/dL   Comment 1 Notify RN    GLUCOSE, CAPILLARY     Status: Abnormal   Collection Time    07/05/13  7:12 AM      Result Value Range   Glucose-Capillary 246 (*) 70 - 99 mg/dL   Comment 1 Notify RN       HEENT: normal Cardio: RRR and murmur Resp: CTA B/L and unlabored GI: BS positive Extremity:  Pulses positive and No Edema Skin:   Breakdown Left distal MCP #3 , 2cm oval with granulation tissue but foul odor and Wound C/D/I Neuro: Alert/Oriented, Cranial Nerve II-XII normal, Normal Sensory and Abnormal Motor 3-/5 ankle DF on left 4/5 bilateral hip flex, 5/5 B Delt,bi,tri,grip Musc/Skel:  Normal Gen: NAD   Assessment/Plan: 1. Functional deficits secondary to R BKA secondary to gangrene which require 3+ hours per day of interdisciplinary therapy in a comprehensive inpatient rehab setting. Physiatrist is providing close team supervision and 24 hour management of active medical problems listed below. Physiatrist and rehab team continue to assess barriers to discharge/monitor patient progress toward functional and medical goals. FIM:    FIM - Upper Body Dressing/Undressing Upper body dressing/undressing steps patient completed: Thread/unthread right sleeve  of pullover shirt/dresss;Thread/unthread left sleeve of pullover shirt/dress;Put head through opening of pull over shirt/dress;Pull shirt over trunk Upper body  dressing/undressing: 5: Set-up assist to: Obtain clothing/put away FIM - Lower Body Dressing/Undressing Lower body dressing/undressing: 1: Two helpers  FIM - Toileting Toileting steps completed by patient: Adjust clothing prior to toileting Toileting: 1: Two helpers  FIM - Diplomatic Services operational officer Devices: Psychiatrist Transfers: 1-Two helpers  FIM - Banker Devices: Arm rests Bed/Chair Transfer: 5: Sit > Supine: Supervision (verbal cues/safety issues);3: Chair or W/C > Bed: Mod A (lift or lower assist)  FIM - Locomotion: Wheelchair Distance: 35 Locomotion: Wheelchair: 1: Travels less than 50 ft with moderate assistance (Pt: 50 - 74%) FIM - Locomotion: Ambulation Ambulation/Gait Assistance: Not tested (comment) Locomotion: Ambulation: 0: Activity did not occur  Comprehension Comprehension Mode: Auditory Comprehension: 7-Follows complex conversation/direction: With no assist  Expression Expression Mode: Verbal Expression: 7-Expresses complex ideas: With no assist  Social Interaction Social Interaction: 7-Interacts appropriately with others - No medications needed.  Problem Solving Problem Solving: 7-Solves complex problems: Recognizes & self-corrects  Memory Memory: 7-Complete Independence: No helper  Medical Problem List and Plan:  1. Right BKA due to a Charcot foot/abscess, prior left CVA with residual right-sided weakness  2. DVT Prophylaxis/Anticoagulation: SCDs left lower extremity.  3. Pain Management: Lyrica 75 mg twice a day, Oxycodone as needed. Monitor with increased mobility  4. Neuropsych: This patient is capable of making decisions on his own behalf.  5. CAD/NSTEMI. Continue aspirin therapy. Plan followup cardiothoracic surgery 6-8  weeks to consider CABG. Patient had chest pain or shortness of breath  6. ID. Sepsis/MSSA bacteremia. Continue Ancef as directed x28 days total initiated 06/25/2013.  7. Diabetes mellitus with peripheral neuropathy. Hemoglobin A1c 8.2. Levemir 25 units daily will titrate, NovoLog 6 units 3 times a day. Check blood sugars a.c. and at bedtime  8. Hypertension. Lopressor 50 mg twice a day. Monitor with increased mobility  9. Acute blood loss anemia. Latest hemoglobin 10.8. Followup CBC  10. Recent multi-toe amputation left foot. Continue wound care as advised. Monitor for increased ischemic changes. Need to be careful with transfers or any attempts with gait. Followup vascular surgery  11.  Peripheral edema ACE wrap, no diuretic due to hyponatremia    LOS (Days) 2 A FACE TO FACE EVALUATION WAS PERFORMED  KIRSTEINS,ANDREW E 07/05/2013, 8:17 AM

## 2013-07-05 NOTE — Progress Notes (Signed)
Unable to void today 1430 cath ed for 1,100cc

## 2013-07-05 NOTE — Progress Notes (Signed)
Occupational Therapy Note  Patient Details  Name: [name] MRN: [id number] Date of Birth: November 17, 1960 Today's Date: 07/05/2013  Time:1030-1130  ( )  1st session Pain:  7/10  Right leg phantom pain Individual session  1st session:  Engaged in therapeutic bathing and dressing at sink level.  Pt. Needed increased time and plus 2 for sit to stand.  Needed LLE stability during sit to stand.  Stood for about 45 seconds with pt holding and propping on the sink.  Pt. Needed multiple rest breaks during session. Pt. Reported he stood earlier with PT and did a lot better.    Left pt in wc with call bell,phone within reach.     Time:1300-130  ( )  2nd session Pain:   0/10 no pain Individual session  Engaged in transfer to toilet.  Unable to pivot to 3n1 over toilet.  Transferred to bed with sliding board with total assist plus 2, pt = 50 %.  Went from sitting to lying with mod assist.  Pt. Very tiired at end of session.     Humberto Seals 07/05/2013, 11:16 AM

## 2013-07-05 NOTE — Progress Notes (Signed)
Physical Therapy Session Note  Patient Details  Name: Albert [name] MRN: [id number] Date of Birth: 12-05-1960  Today's Date: 07/05/2013 Time: 7829-5621 and 3086-5784 Time Calculation (min): 58 min and 45 min  Short Term Goals: Week 1:  PT Short Term Goal 1 (Week 1): Patient will perform bed mobility from flat surface without use of bedrails with supervision. PT Short Term Goal 2 (Week 1): Patient will perform bed<>chair transfers to bilateral sides with minA. PT Short Term Goal 3 (Week 1): Patient will perform sit<>stand transfer with RW with maxA x1. PT Short Term Goal 4 (Week 1): Patient will perform wheelchair mobility x50' with minA.  Skilled Therapeutic Interventions/Progress Updates:   AM Session: Patient received supine in bed, reports sleeping better last night. Patient transfers supine>sit with bed rail and HOB slightly elevated. Sitting edge of bed, vitals taken, see below. Patient with requests to don pants and new shirt. Patient requires assist to thread pants for each LE while seated edge of bed. Patient pulls pants as far as possible in seated position. Patient performed sit>stand from elevated bed with use of RW and modA, requires total A to pull up pants. Patient requires modA for stand>sit to control descent. Patient transfers bed>wheelchair via lateral/scoot transfer with minA. See details below for dynamic balance activity at sink and wheelchair propulsion. In gym, patient performed x3 sit<>stand transfers from wheelchair to RW with maxA progressing to modA. In between transfers, patient performed static standing x60" with RW and minA, requires verbal and tactile cues for upright posture. Patient returned to room and left seated in wheelchair with all needs within reach.  PM Session: Patient received supine in bed, asleep, but easily aroused. Patient with c/o "complete exhaustion", but agreeable to perform supine LE therex. In supine: quad sets with 5" hold, SLRs, L ankle pumps,  L bridging, hip abd, L heel slides/knee flexion, R knee flexion; all x20 reps. Sitting edge of bed, patient performed x20 LAQ. In sitting, patient performed several lateral leans to doff pants; requires total A for doffing of pants, but no assist required for maintaining balance during lateral leans. Patient left seated edge of bed with all needs within reach.   Therapy Documentation Precautions:  Precautions Precautions: Fall Precaution Comments: watch HR Restrictions Weight Bearing Restrictions: No RLE Weight Bearing: Non weight bearing Other Position/Activity Restrictions: elevate operated limb, do not prop pillows under knee; try to position on LEFT side so that RIGHT hip can be in neutral/extension Vital Signs: Therapy Vitals Pulse Rate: 96 BP: 105/63 mmHg Patient Position, if appropriate: Sitting Oxygen Therapy SpO2: 100 % O2 Device: None (Room air) Pulse Oximetry Type: Intermittent Pain: Pain Assessment Pain Assessment: No/denies pain Pain Score: 0-No pain Locomotion : Ambulation Ambulation/Gait Assistance: Not tested (comment) Wheelchair Mobility Wheelchair Mobility: Yes Wheelchair Assistance: 3: Mod assist Wheelchair Assistance Details: Verbal cues for sequencing;Verbal cues for technique;Verbal cues for precautions/safety;Tactile cues for placement Wheelchair Propulsion: Both upper extremities Wheelchair Parts Management: Needs assistance Distance: 50  Balance: Balance Balance Assessed: Yes Dynamic Standing Balance Dynamic Standing - Balance Support: Right upper extremity supported;During functional activity Dynamic Standing - Level of Assistance: 4: Min assist Dynamic Standing - Balance Activities: Reaching for objects;Reaching across midline;Forward lean/weight shifting Dynamic Standing - Comments: Patient stood at sink for 2.5' with R UE on counter for support while using L UE to reach for objects (comb, toothbrush, tooth paste) to comb hair and brush  teeth.  See FIM for current functional status  Therapy/Group: Individual Therapy  Clarisse Gouge  S Jeselle Hiser S. Leyan Branden, PT, DPT 07/05/2013, 10:57 AM

## 2013-07-06 ENCOUNTER — Inpatient Hospital Stay (HOSPITAL_COMMUNITY): Payer: 59

## 2013-07-06 LAB — GLUCOSE, CAPILLARY: Glucose-Capillary: 204 mg/dL — ABNORMAL HIGH (ref 70–99)

## 2013-07-06 NOTE — Progress Notes (Signed)
Physical Therapy Session Note  Patient Details  Name: Albert [name] MRN: [id number] Date of Birth: Feb 14, 1961  Today's Date: 07/06/2013 Time: 0900-0955 Time Calculation (min): 55 min  Short Term Goals: Week 1:  PT Short Term Goal 1 (Week 1): Patient will perform bed mobility from flat surface without use of bedrails with supervision. PT Short Term Goal 2 (Week 1): Patient will perform bed<>chair transfers to bilateral sides with minA. PT Short Term Goal 3 (Week 1): Patient will perform sit<>stand transfer with RW with maxA x1. PT Short Term Goal 4 (Week 1): Patient will perform wheelchair mobility x50' with minA.  Skilled Therapeutic Interventions/Progress Updates:    Pt participated in group for LE exercise for focus on LE strengthening and overall endurance to aid with transfers and mobility and increase ROM. Completed LAQ, seated marches, supine hip abduction and SLR x 10 reps x 3 set each BLE. Transfers with scoot pivot technique with min A overall. Fatigued easily and required frequent rest breaks.  Therapy Documentation Precautions:  Precautions Precautions: Fall Precaution Comments: watch HR Restrictions Weight Bearing Restrictions: No RLE Weight Bearing: Non weight bearing Other Position/Activity Restrictions: elevate operated limb, do not prop pillows under knee; try to position on LEFT side so that RIGHT hip can be in neutral/extension  Pain: No complaints of pain. Repositioned throughout session as needed or rest breaks given.  See FIM for current functional status  Therapy/Group: Group Therapy  Karolee Stamps [facility] 07/06/2013, 12:09 PM

## 2013-07-06 NOTE — Progress Notes (Signed)
Patient ID: Albert [name], male   DOB: December 10, 1960, 52 y.o.   MRN: [id number] Subjective/Complaints: 52 y.o. 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 Admitted 06/25/2013 with nonhealing ulcer to the right foot and Charcot collapse right foot. Underwent irrigation and debridement of right foot plantar abscess 06/25/2013 per Dr. Magnus Ivan. Postoperatively with hypotension and tachycardia. Cardiology services consulted with findings of elevated troponin levels of 10. Echocardiogram with distal septal hypokinesis with overall low-normal LVEF. Elevated troponin levels are likely related to underlying sepsis. Underwent cardiac catheterization 06/28/2013 with findings of triple vessel CAD NSTEMI secondary to demand ischemia and mild LV systolic dysfunction. Patient placed on medical management with aspirin therapy. Patient with ongoing ischemic changes right lower extremity and underwent right below-knee amputation 06/29/2013 per Dr. Lajoyce Corners. Cardiothoracic surgery followup to consider CABG after recovery from BKA. TEE completed 07/03/2013 any vegetation that was negative with ejection fraction 45-50% .patient currently remains on Ancef therapy per infectious disease for sepsis /MSSA bacteremia with plan for 28 day duration  Tired in physical therapy working out in gym  Review of Systems  Constitutional: Negative for fever and chills.  Gastrointestinal: Positive for constipation.  All other systems reviewed and are negative.    Objective: Vital Signs: Blood pressure 97/67, pulse 74, temperature 97.3 F (36.3 C), temperature source Oral, resp. rate 19, height 5' 10.87" (1.8 m), weight 121.6 kg (268 lb 1.3 oz), SpO2 97.00%. No results found. Results for orders placed during the hospital encounter of  07/03/13 (from the past 72 hour(s))  GLUCOSE, CAPILLARY     Status: Abnormal   Collection Time    07/03/13  5:54 PM      Result Value Range   Glucose-Capillary 201 (*) 70 - 99 mg/dL  GLUCOSE, CAPILLARY     Status: Abnormal   Collection Time    07/03/13  9:46 PM      Result Value Range   Glucose-Capillary 228 (*) 70 - 99 mg/dL   Comment 1 Notify RN    CBC WITH DIFFERENTIAL     Status: Abnormal   Collection Time    07/04/13  5:00 AM      Result Value Range   WBC 18.9 (*) 4.0 - 10.5 K/uL   RBC 3.06 (*) 4.22 - 5.81 MIL/uL   Hemoglobin 8.2 (*) 13.0 - 17.0 g/dL   HCT 78.2 (*) 95.6 - 21.3 %   MCV 79.1  78.0 - 100.0 fL   MCH 26.8  26.0 - 34.0 pg   MCHC 33.9  30.0 - 36.0 g/dL   RDW 08.6  57.8 - 46.9 %   Platelets 548 (*) 150 - 400 K/uL   Neutrophils Relative % 84 (*) 43 - 77 %   Neutro Abs 15.9 (*) 1.7 - 7.7 K/uL   Lymphocytes Relative 10 (*) 12 - 46 %   Lymphs Abs 1.9  0.7 - 4.0 K/uL   Monocytes Relative 5  3 - 12 %   Monocytes Absolute 1.0  0.1 - 1.0 K/uL   Eosinophils Relative 0  0 - 5 %   Eosinophils Absolute 0.1  0.0 - 0.7 K/uL   Basophils Relative 0  0 - 1 %   Basophils Absolute 0.0  0.0 - 0.1 K/uL  COMPREHENSIVE METABOLIC PANEL     Status: Abnormal   Collection Time    07/04/13  5:00 AM      Result Value Range   Sodium 127 (*) 135 - 145 mEq/L   Potassium 4.1  3.5 - 5.1 mEq/L   Chloride 94 (*) 96 - 112 mEq/L   CO2 20  19 - 32 mEq/L   Glucose, Bld 207 (*) 70 - 99 mg/dL   BUN 52 (*) 6 - 23 mg/dL   Creatinine, Ser 0.98 (*) 0.50 - 1.35 mg/dL   Calcium 7.8 (*) 8.4 - 10.5 mg/dL   Total Protein 6.4  6.0 - 8.3 g/dL   Albumin 2.2 (*) 3.5 - 5.2 g/dL   AST 34  0 - 37 U/L   ALT 16  0 - 53 U/L   Alkaline Phosphatase 88  39 - 117 U/L   Total Bilirubin 0.3  0.3 - 1.2 mg/dL   GFR calc non Af Amer 43 (*) >90 mL/min   GFR calc Af Amer 50 (*) >90 mL/min   Comment: (NOTE)     The eGFR has been calculated using the CKD EPI equation.     This calculation has not been validated in all  clinical situations.     eGFR's persistently <90 mL/min signify possible Chronic Kidney     Disease.  GLUCOSE, CAPILLARY     Status: Abnormal   Collection Time    07/04/13  7:33 AM      Result Value Range   Glucose-Capillary 192 (*) 70 - 99 mg/dL   Comment 1 Notify RN    GLUCOSE, CAPILLARY     Status: Abnormal   Collection Time    07/04/13 11:29 AM      Result Value Range   Glucose-Capillary 236 (*) 70 - 99 mg/dL   Comment 1 Notify RN    GLUCOSE, CAPILLARY     Status: Abnormal   Collection Time    07/04/13  4:19 PM      Result Value Range   Glucose-Capillary 236 (*) 70 - 99 mg/dL  GLUCOSE, CAPILLARY     Status: Abnormal   Collection Time    07/04/13  9:00 PM      Result Value Range   Glucose-Capillary 255 (*) 70 - 99 mg/dL   Comment 1 Notify RN    GLUCOSE, CAPILLARY     Status: Abnormal   Collection Time    07/05/13  7:12 AM      Result Value Range   Glucose-Capillary 246 (*) 70 - 99 mg/dL   Comment 1 Notify RN    GLUCOSE, CAPILLARY     Status: Abnormal   Collection Time    07/05/13 11:34 AM      Result Value Range   Glucose-Capillary 288 (*) 70 - 99 mg/dL   Comment 1 Notify RN    GLUCOSE, CAPILLARY     Status: Abnormal   Collection Time    07/05/13  4:50 PM      Result Value Range   Glucose-Capillary 203 (*) 70 - 99 mg/dL   Comment 1 Notify RN    GLUCOSE, CAPILLARY     Status: Abnormal   Collection Time    07/05/13  8:53 PM      Result Value Range   Glucose-Capillary 189 (*) 70 - 99 mg/dL   Comment 1 Notify RN     Comment 2 Documented in Chart    GLUCOSE, CAPILLARY     Status: Abnormal   Collection Time    07/06/13  7:17 AM      Result Value Range  Glucose-Capillary 204 (*) 70 - 99 mg/dL   Comment 1 Notify RN       HEENT: normal Cardio: RRR and murmur Resp: CTA B/L and unlabored GI: BS positive Extremity:  Pulses positive and No Edema Skin:   Breakdown Left distal MCP #3 , 2cm oval with granulation tissue but foul odor and Wound C/D/I Neuro:  Alert/Oriented, Cranial Nerve II-XII normal, Normal Sensory and Abnormal Motor 3-/5 ankle DF on left 4/5 bilateral hip flex, 5/5 B Delt,bi,tri,grip Musc/Skel:  Normal Gen: NAD   Assessment/Plan: 1. Functional deficits secondary to R BKA secondary to gangrene which require 3+ hours per day of interdisciplinary therapy in a comprehensive inpatient rehab setting. Physiatrist is providing close team supervision and 24 hour management of active medical problems listed below. Physiatrist and rehab team continue to assess barriers to discharge/monitor patient progress toward functional and medical goals. FIM: FIM - Bathing Bathing Steps Patient Completed: Chest;Right Arm;Left Arm;Abdomen;Right upper leg;Left upper leg Bathing: 3: Mod-Patient completes 5-7 68f 10 parts or 50-74%  FIM - Upper Body Dressing/Undressing Upper body dressing/undressing steps patient completed: Thread/unthread right sleeve of pullover shirt/dresss;Thread/unthread left sleeve of pullover shirt/dress;Put head through opening of pull over shirt/dress;Pull shirt over trunk Upper body dressing/undressing: 5: Set-up assist to: Obtain clothing/put away FIM - Lower Body Dressing/Undressing Lower body dressing/undressing: 1: Two helpers  FIM - Toileting Toileting steps completed by patient: Adjust clothing prior to toileting Toileting: 1: Two helpers  FIM - Diplomatic Services operational officer Devices: Psychiatrist Transfers: 1-Two helpers  FIM - Banker Devices: Arm rests;Bed rails;HOB elevated Bed/Chair Transfer: 5: Sit > Supine: Supervision (verbal cues/safety issues);4: Bed > Chair or W/C: Min A (steadying Pt. > 75%)  FIM - Locomotion: Wheelchair Distance: 50 Locomotion: Wheelchair: 2: Travels 50 - 149 ft with moderate assistance (Pt: 50 - 74%) FIM - Locomotion: Ambulation Ambulation/Gait Assistance: Not tested (comment) Locomotion: Ambulation: 0: Activity did  not occur  Comprehension Comprehension Mode: Auditory Comprehension: 7-Follows complex conversation/direction: With no assist  Expression Expression Mode: Verbal Expression: 7-Expresses complex ideas: With no assist  Social Interaction Social Interaction: 7-Interacts appropriately with others - No medications needed.  Problem Solving Problem Solving: 7-Solves complex problems: Recognizes & self-corrects  Memory Memory: 7-Complete Independence: No helper  Medical Problem List and Plan:  1. Right BKA due to a Charcot foot/abscess, prior left CVA with residual right-sided weakness  2. DVT Prophylaxis/Anticoagulation: SCDs left lower extremity.  3. Pain Management: Lyrica 75 mg twice a day, Oxycodone as needed. Monitor with increased mobility  4. Neuropsych: This patient is capable of making decisions on his own behalf.  5. CAD/NSTEMI. Continue aspirin therapy. Plan followup cardiothoracic surgery 6-8 weeks to consider CABG. Patient had chest pain or shortness of breath  6. ID. Sepsis/MSSA bacteremia. Continue Ancef as directed x28 days total initiated 06/25/2013.  7. Diabetes mellitus with peripheral neuropathy. Hemoglobin A1c 8.2. Levemir 25  will titrate, NovoLog 3 times a day. Check blood sugars a.c. and at bedtime  8. Hypertension. Lopressor 50 mg twice a day. Monitor with increased mobility  9. Acute blood loss anemia. Latest hemoglobin 10.8. Followup CBC  10. Recent multi-toe amputation left foot. Continue wound care as advised. Monitor for increased ischemic changes. Need to be careful with transfers or any attempts with gait. Followup vascular surgery  11.  Peripheral edema ACE wrap, no diuretic due to hyponatremia    LOS (Days) 3 A FACE TO FACE EVALUATION WAS PERFORMED  Sakina Briones E 07/06/2013, 10:21  AM    

## 2013-07-07 ENCOUNTER — Inpatient Hospital Stay (HOSPITAL_COMMUNITY): Payer: 59 | Admitting: *Deleted

## 2013-07-07 ENCOUNTER — Inpatient Hospital Stay (HOSPITAL_COMMUNITY): Payer: 59 | Admitting: Physical Therapy

## 2013-07-07 LAB — GLUCOSE, CAPILLARY
Glucose-Capillary: 201 mg/dL — ABNORMAL HIGH (ref 70–99)
Glucose-Capillary: 243 mg/dL — ABNORMAL HIGH (ref 70–99)
Glucose-Capillary: 179 mg/dL — ABNORMAL HIGH (ref 70–99)

## 2013-07-07 MED ORDER — SENNOSIDES-DOCUSATE SODIUM 8.6-50 MG PO TABS
2.0000 | ORAL_TABLET | Freq: Every day | ORAL | Status: DC
  Administered 2013-07-07 – 2013-07-09 (×3): 2 via ORAL
  Filled 2013-07-07 (×5): qty 2

## 2013-07-07 MED ORDER — OXYCODONE HCL 5 MG PO TABS
15.0000 mg | ORAL_TABLET | ORAL | Status: DC | PRN
  Administered 2013-07-07 – 2013-07-13 (×11): 15 mg via ORAL
  Filled 2013-07-07 (×12): qty 3

## 2013-07-07 MED ORDER — INSULIN DETEMIR 100 UNIT/ML ~~LOC~~ SOLN
35.0000 [IU] | Freq: Every day | SUBCUTANEOUS | Status: DC
  Administered 2013-07-08 – 2013-07-14 (×7): 35 [IU] via SUBCUTANEOUS
  Filled 2013-07-07 (×8): qty 0.35

## 2013-07-07 NOTE — Progress Notes (Signed)
Occupational Therapy Note  Patient Details  Name: Albert [name] MRN: [id number] Date of Birth: [birth year] Today's Date: 07/07/2013  Time: 0830-0930   (60 min)  1st session Pain:  none Individual session  1st session:  Engaged in bed mobility, sit to stand, standing endurance, activity tolerance. Pt. Went from supine to sit with bed rail, HOB up with supervision.  Pt. Sat EOB to wash and stood to wash peri area.  Transferred from bed to wc with stand pivot plus 2, pt= 70 %.   Left in room with call bell and phone in reach.     Time: 1345-1430  ( ) Pain:  none Individual session 2nd session:  Pt. Finishing up with PT session.  Pt sitting on mat.  Addressed LUE AROM using 4 # weight and AROM to RUE.  Pt. Reported he could use his RUE last January pretty good.  He stated he had not used it as much with the amputation.  Pt used RUE active shoulder flexion (90 degrees); and abduction (90 degrees).  Practiced sit>supine and vice versa with SBA.  Transferred with RW using stand pivot technique with minimal.        Humberto Seals 07/07/2013, 9:32 AM

## 2013-07-07 NOTE — Progress Notes (Signed)
Patient ID: Albert [name], male   DOB: 16-Oct-1961, 52 y.o.   MRN: [id number] Subjective/Complaints: 52 y.o. 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 Admitted 06/25/2013 with nonhealing ulcer to the right foot and Charcot collapse right foot. Underwent irrigation and debridement of right foot plantar abscess 06/25/2013 per Dr. Magnus Ivan. Postoperatively with hypotension and tachycardia. Cardiology services consulted with findings of elevated troponin levels of 10. Echocardiogram with distal septal hypokinesis with overall low-normal LVEF. Elevated troponin levels are likely related to underlying sepsis. Underwent cardiac catheterization 06/28/2013 with findings of triple vessel CAD NSTEMI secondary to demand ischemia and mild LV systolic dysfunction. Patient placed on medical management with aspirin therapy. Patient with ongoing ischemic changes right lower extremity and underwent right below-knee amputation 06/29/2013 per Dr. Lajoyce Corners. Cardiothoracic surgery followup to consider CABG after recovery from BKA. TEE completed 07/03/2013 any vegetation that was negative with ejection fraction 45-50% .patient currently remains on Ancef therapy per infectious disease for sepsis /MSSA bacteremia with plan for 28 day duration  Complains of right residual limb pain. No significant phantom limb pain Still having problems with bowel's  Review of Systems  Constitutional: Negative for fever and chills.  Gastrointestinal: Positive for constipation.  All other systems reviewed and are negative.    Objective: Vital Signs: Blood pressure 119/83, pulse 86, temperature 97.5 F (36.4 C), temperature source Oral, resp. rate 19, height 5' 10.87" (1.8 m), weight 121.6 kg (268 lb 1.3 oz), SpO2 99.00%. No results  found. Results for orders placed during the hospital encounter of 07/03/13 (from the past 72 hour(s))  GLUCOSE, CAPILLARY     Status: Abnormal   Collection Time    07/04/13 11:29 AM      Result Value Range   Glucose-Capillary 236 (*) 70 - 99 mg/dL   Comment 1 Notify RN    GLUCOSE, CAPILLARY     Status: Abnormal   Collection Time    07/04/13  4:19 PM      Result Value Range   Glucose-Capillary 236 (*) 70 - 99 mg/dL  GLUCOSE, CAPILLARY     Status: Abnormal   Collection Time    07/04/13  9:00 PM      Result Value Range   Glucose-Capillary 255 (*) 70 - 99 mg/dL   Comment 1 Notify RN    GLUCOSE, CAPILLARY     Status: Abnormal   Collection Time    07/05/13  7:12 AM      Result Value Range   Glucose-Capillary 246 (*) 70 - 99 mg/dL   Comment 1 Notify RN    GLUCOSE, CAPILLARY     Status: Abnormal   Collection Time    07/05/13 11:34 AM      Result Value Range   Glucose-Capillary 288 (*) 70 - 99 mg/dL   Comment 1 Notify RN    GLUCOSE, CAPILLARY     Status: Abnormal   Collection Time    07/05/13  4:50 PM      Result Value Range   Glucose-Capillary 203 (*) 70 - 99 mg/dL   Comment 1 Notify RN    GLUCOSE, CAPILLARY     Status: Abnormal   Collection Time    07/05/13  8:53 PM      Result Value Range   Glucose-Capillary 189 (*) 70 - 99 mg/dL   Comment 1 Notify RN  Comment 2 Documented in Chart    GLUCOSE, CAPILLARY     Status: Abnormal   Collection Time    07/06/13  7:17 AM      Result Value Range   Glucose-Capillary 204 (*) 70 - 99 mg/dL   Comment 1 Notify RN    GLUCOSE, CAPILLARY     Status: Abnormal   Collection Time    07/06/13 11:24 AM      Result Value Range   Glucose-Capillary 222 (*) 70 - 99 mg/dL   Comment 1 Notify RN    GLUCOSE, CAPILLARY     Status: Abnormal   Collection Time    07/06/13  4:25 PM      Result Value Range   Glucose-Capillary 207 (*) 70 - 99 mg/dL   Comment 1 Notify RN    GLUCOSE, CAPILLARY     Status: Abnormal   Collection Time    07/06/13   9:11 PM      Result Value Range   Glucose-Capillary 198 (*) 70 - 99 mg/dL  GLUCOSE, CAPILLARY     Status: Abnormal   Collection Time    07/07/13  6:55 AM      Result Value Range   Glucose-Capillary 201 (*) 70 - 99 mg/dL     HEENT: normal Cardio: RRR and murmur Resp: CTA B/L and unlabored GI: BS positive Extremity:  Pulses positive and No Edema Skin:   Breakdown Left distal MCP #3 , 2cm oval with granulation tissue but foul odor and Wound C/D/I Neuro: Alert/Oriented, Cranial Nerve II-XII normal, Normal Sensory and Abnormal Motor 3-/5 ankle DF on left 4/5 bilateral hip flex, 5/5 B Delt,bi,tri,grip Musc/Skel:  Normal Gen: NAD   Assessment/Plan: 1. Functional deficits secondary to R BKA secondary to gangrene which require 3+ hours per day of interdisciplinary therapy in a comprehensive inpatient rehab setting. Physiatrist is providing close team supervision and 24 hour management of active medical problems listed below. Physiatrist and rehab team continue to assess barriers to discharge/monitor patient progress toward functional and medical goals. FIM: FIM - Bathing Bathing Steps Patient Completed: Chest;Right Arm;Left Arm;Abdomen;Right upper leg;Left upper leg Bathing: 3: Mod-Patient completes 5-7 50f 10 parts or 50-74%  FIM - Upper Body Dressing/Undressing Upper body dressing/undressing steps patient completed: Thread/unthread right sleeve of pullover shirt/dresss;Thread/unthread left sleeve of pullover shirt/dress;Put head through opening of pull over shirt/dress;Pull shirt over trunk Upper body dressing/undressing: 5: Set-up assist to: Obtain clothing/put away FIM - Lower Body Dressing/Undressing Lower body dressing/undressing: 1: Two helpers  FIM - Toileting Toileting steps completed by patient: Adjust clothing prior to toileting Toileting: 1: Two helpers  FIM - Diplomatic Services operational officer Devices: Psychiatrist Transfers: 1-Two helpers  FIM -  Banker Devices: Arm rests Bed/Chair Transfer: 6: Supine > Sit: No assist;6: Sit > Supine: No assist;4: Bed > Chair or W/C: Min A (steadying Pt. > 75%);4: Chair or W/C > Bed: Min A (steadying Pt. > 75%)  FIM - Locomotion: Wheelchair Distance: 50 Locomotion: Wheelchair: 2: Travels 50 - 149 ft with moderate assistance (Pt: 50 - 74%) FIM - Locomotion: Ambulation Ambulation/Gait Assistance: Not tested (comment) Locomotion: Ambulation: 0: Activity did not occur  Comprehension Comprehension Mode: Auditory Comprehension: 7-Follows complex conversation/direction: With no assist  Expression Expression Mode: Verbal Expression: 7-Expresses complex ideas: With no assist  Social Interaction Social Interaction: 7-Interacts appropriately with others - No medications needed.  Problem Solving Problem Solving: 7-Solves complex problems: Recognizes & self-corrects  Memory Memory: 7-Complete Independence:  No helper  Medical Problem List and Plan:  1. Right BKA due to a Charcot foot/abscess, prior left CVA with residual right-sided weakness  2. DVT Prophylaxis/Anticoagulation: SCDs left lower extremity.  3. Pain Management: Lyrica 75 mg twice a day, Oxycodone , titrate the dose. Monitor with increased mobility  4. Neuropsych: This patient is capable of making decisions on his own behalf.  5. CAD/NSTEMI. Continue aspirin therapy. Plan followup cardiothoracic surgery 6-8 weeks to consider CABG. Patient had chest pain or shortness of breath  6. ID. Sepsis/MSSA bacteremia. Continue Ancef as directed x28 days total initiated 06/25/2013.  7. Diabetes mellitus with peripheral neuropathy, uncontrolled. Hemoglobin A1c 8.2. Levemir 25  will titrate, NovoLog 3 times a day. Check blood sugars a.c. and at bedtime  8. Hypertension. Lopressor 50 mg twice a day. Monitor with increased mobility  9. Acute blood loss anemia. Latest hemoglobin 10.8. Followup CBC  10. Recent  multi-toe amputation left foot. Continue wound care as advised. Monitor for increased ischemic changes. Need to be careful with transfers or any attempts with gait. Followup vascular surgery , check with wound center regarding previous dressing change instruction, consider wound care consult 11.  Peripheral edema ACE wrap, no diuretic due to hyponatremia    LOS (Days) 4 A FACE TO FACE EVALUATION WAS PERFORMED  KIRSTEINS,ANDREW E 07/07/2013, 9:25 AM

## 2013-07-07 NOTE — Progress Notes (Addendum)
Physical Therapy Note  Patient Details  Name: ALIKA SALADIN MRN: [id number] Date of Birth: 1961-01-18 Today's Date: 07/07/2013  1100-1155 (55 minutes) group Pain: 5/10 left BKA / premedicated Pt participated on PT group session focused on UE strengthening to improve functional mobility; Therapeutic exercise using paragym apparatus 3 x 10 reps 15# including shoulder depression; scapular adduction (15 #), elbow extension 5# - LT UE only, elbow flexion (5# left UE only).   1300-1325 (25 minutes) individual Pain: 5/0 left BKA/ premedicated Focus of treatment: transfer training; RT BKA exercises in supine Treatment: Pt up in wc; transfers wc > mat; assist with placement of wc secondary to decreased grip RT hand, tactile cues for legrests; reminder cues for brakes; scoot transfer (Level only) SBA with increased time and difficulty; therapeutic exercises X 10 bilaterally-  hip/knee flexion extension, hip abduction; SAQs.  Lindee Leason,JIM 07/07/2013, 11:06 AM

## 2013-07-07 NOTE — Progress Notes (Signed)
Patient refused pre cabg doppler and  Mapping today

## 2013-07-08 ENCOUNTER — Inpatient Hospital Stay (HOSPITAL_COMMUNITY): Payer: 59

## 2013-07-08 ENCOUNTER — Inpatient Hospital Stay (HOSPITAL_COMMUNITY): Payer: 59 | Admitting: *Deleted

## 2013-07-08 ENCOUNTER — Encounter (HOSPITAL_COMMUNITY): Payer: 59

## 2013-07-08 DIAGNOSIS — E1165 Type 2 diabetes mellitus with hyperglycemia: Secondary | ICD-10-CM

## 2013-07-08 DIAGNOSIS — S88119A Complete traumatic amputation at level between knee and ankle, unspecified lower leg, initial encounter: Secondary | ICD-10-CM

## 2013-07-08 DIAGNOSIS — I70269 Atherosclerosis of native arteries of extremities with gangrene, unspecified extremity: Secondary | ICD-10-CM

## 2013-07-08 LAB — GLUCOSE, CAPILLARY
Glucose-Capillary: 228 mg/dL — ABNORMAL HIGH (ref 70–99)
Glucose-Capillary: 158 mg/dL — ABNORMAL HIGH (ref 70–99)
Glucose-Capillary: 101 mg/dL — ABNORMAL HIGH (ref 70–99)
Glucose-Capillary: 110 mg/dL — ABNORMAL HIGH (ref 70–99)

## 2013-07-08 MED ORDER — HYDROCORTISONE 1 % EX CREA
1.0000 "application " | TOPICAL_CREAM | Freq: Two times a day (BID) | CUTANEOUS | Status: DC
  Administered 2013-07-08 – 2013-07-09 (×4): 1 via TOPICAL
  Filled 2013-07-08: qty 28

## 2013-07-08 NOTE — Progress Notes (Signed)
Social Work Patient ID: Albert [name], male   DOB: March 18, 1961, 52 y.o.   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  He reported it is still at home. He will have his wife bring in.  He states: " Not a good day today, the more he works the weaker he gets."  He realizes it will take time but discouraged  Today.  Discussed only can do his best and go from there.  Allowed him to ventilate and provided support.

## 2013-07-08 NOTE — Progress Notes (Signed)
Physical Therapy Session Note  Patient Details  Name: Albert [name] MRN: [id number] Date of Birth: 10-May-1961  Today's Date: 07/08/2013 Time: 1035-1130 and 1500-1530 Time Calculation (min): 55 min and 30 min  Short Term Goals: Week 1:  PT Short Term Goal 1 (Week 1): Patient will perform bed mobility from flat surface without use of bedrails with supervision. PT Short Term Goal 2 (Week 1): Patient will perform bed<>chair transfers to bilateral sides with minA. PT Short Term Goal 3 (Week 1): Patient will perform sit<>stand transfer with RW with maxA x1. PT Short Term Goal 4 (Week 1): Patient will perform wheelchair mobility x50' with minA.  Skilled Therapeutic Interventions/Progress Updates:    AM session: Patient received seated in wheelchair. Session focused on transfers, balance, coordination, and wheelchair mobility. Patient instructed in sit<>stand 3x, able to come to full stand 1 of 3  with +2 totalA, RW for B UE support, maintained stand for 15sec, patient reported he felt very unbalanced and was afraid he was going to fall. First 2 attempts sit>stand unsuccessful due possible patient fatigue and low transfer surface.  Patient instructed in wheelchair<>mat transfers, dynamic sitting activities, and wheelchair mobility, see below for details. Patient returned to room, in wheelchair with all needs within reach, patient requested to use bathroom, nursing notified.   PM session: Patient received sleeping in wheelchair, easily aroused, but stated he is very exhausted. Session focused on wheelchair mobility, transfers, and bed mobility. Patient propelled wheelchair 26' with LUE for pushing LLE for steering minA, verbal/visual cues provided for hand placement and for use of LLE for steering. Assistance provided to help patient initiate movement and maintain momentum while propelling chair.  Patient instructed in wheelchair <> bed squat pivot transfers in ADL apartment. Patient +2 totalA wheelchair  <>bed, with verbal cueing provided for hand placement and to weight shift anteriorly when initiating transfer. Patient completed bed mobility sit<>supine and rolling R and L, supervision level, verbal cueing provided for instruction purposes only. Patient returned to room in wheelchair, with all needs within reach.   Therapy Documentation Precautions:  Precautions Precautions: Fall Precaution Comments: watch HR Restrictions Weight Bearing Restrictions: No RLE Weight Bearing: Non weight bearing Other Position/Activity Restrictions: elevate operated limb, do not prop pillows under knee; try to position on LEFT side so that RIGHT hip can be in neutral/extension Pain: Pain Assessment Pain Assessment: No/denies pain Mobility: Transfers Transfers: Yes Squat Pivot Transfers: 3: Mod assist;4: Min assist;With armrests Squat Pivot Transfer Details: Tactile cues for placement;Verbal cues for sequencing;Visual cues/gestures for precautions/safety;Verbal cues for precautions/safety;Verbal cues for technique Squat Pivot Transfer Details (indicate cue type and reason): Patient required verbal/visual cueing for hand placement and to shift weight anteriorly when initiating transfers. ModA wheelchair > elevated mat (to L side)  and MinA elevated mat > wheelchair (to L side).  Locomotion : Corporate treasurer: Yes Wheelchair Assistance: 4: Administrator, sports Details: Tactile cues for placement;Verbal cues for sequencing;Visual cues/gestures for sequencing;Visual cues/gestures for precautions/safety;Verbal cues for precautions/safety;Verbal cues for Diplomatic Services operational officer: Left upper extremity;Left lower extremity Wheelchair Parts Management: Needs assistance Distance: Patient propelled wheelchair 50' MinA. Required verbal/visual instruction for propelling wheelchair with LUE and steering with LLE, required occasional assist with steering and small push for momentum.  Patient fatigued quickly and required rest breaks.      Balance: Balance Balance Assessed: Yes Static Sitting Balance Static Sitting - Balance Support: No upper extremity supported;Feet unsupported Static Sitting - Level of Assistance: 5: Stand by assistance Static  Sitting - Comment/# of Minutes: 3 Dynamic Sitting Balance Dynamic Sitting - Balance Activities: Forward lean/weight shifting;Lateral lean/weight shifting;Reaching for objects;Reaching across midline Dynamic Sitting - Comments: Patient instructed in reaching activity 10x1 with LLE supported, reached back and left  to pickup object, reached across midline and superiorly to hang object on basketball hoop, MinA level. Activity repeated 10x1 with LLE unsupported, minA level. Patient instructed in forward lean activitiy 8x1, placing rings on numbered pegs, LLE supported, MinA level with verbal cues provided for instruction.     Other Treatments:    See FIM for current functional status  Therapy/Group: Individual Therapy  Doralee Albino 07/08/2013, 12:19 PM

## 2013-07-08 NOTE — Progress Notes (Signed)
Occupational Therapy Session Note  Patient Details  Name: Albert [name] MRN: [id number] Date of Birth: [birth year]  Today's Date: 07/08/2013 Time: 0930-1030 [id number] Time Calculation (min): 60 min and 45 min   Short Term Goals: Week 1:  OT Short Term Goal 1 (Week 1): Pt will complete LB dressing with mod assist  OT Short Term Goal 2 (Week 1): Pt will complete toilet transfer to drop arm BSC with mod assist  OT Short Term Goal 3 (Week 1): Pt will tolerate standing during 1 ADL with min assist for balance OT Short Term Goal 4 (Week 1): Pt will complete bathing task with min assist standing balance  Skilled Therapeutic Interventions/Progress Updates:    Session 1: Therapy session focused on sitting balance, sit<>stand, and functional transfers during ADL retraining. Pt completed sponge bath sitting EOB utilizing lateral leans for buttocks care however requiring assist for thoroughness secondary to RUE weakness from previous CVA. Completed dressing requiring total assist as reacher not present this AM. Completed sit<>stand with mod assist and min assist standing balance as therapist managed clothing around waist. Pt required frequent rest breaks secondary to fatigue. Completed lateral scoot transfer bed>w/c with min assist. Pt then propelled self in w/c to sink to complete oral care. Pt has difficulty with using RUE to assist with propelling self and therapist educated on using LLE. Pt reported this felt "much better" and was able to propel self to sink more quickly. Pt left sitting in w/c with all items in reach.   Session 2: Therapy session focused on functional transfers, energy conservation techniques, and safety awareness. Pt received sitting in w/c and was asleep however easily aroused. Practiced completing toilet transfer to [facility] with pt demonstrating increased anxiety after bringing up this subject. Provided education on completing transfer using same technique as all other transfers. Set pt up  and he agreed to complete. Pt began pushing BSC away and therapist provided education as to why this was dangerous. Pt required max assist to complete transfer as he was very anxious and not following cues from therapist. Pt required long rest break then completed transfer back to w/c with max assist. Discussed how setup of transfer could be changed to decrease anxiety. Pt unsure then agreed that a large BSC "might help." Pt continuously reporting how tired. Discussed changing therapy session to increase rest breaks and pt agreed. Pt left in w/c for neuro psyc and all needs in reach.   Therapy Documentation Precautions:  Precautions Precautions: Fall Precaution Comments: watch HR Restrictions Weight Bearing Restrictions: No RLE Weight Bearing: Non weight bearing Other Position/Activity Restrictions: elevate operated limb, do not prop pillows under knee; try to position on LEFT side so that RIGHT hip can be in neutral/extension General:   Vital Signs: Therapy Vitals Pulse Rate: 80 BP: 114/75 mmHg Pain: No c/o pain during therapy session 1: Pt reporting phantom limb pain during session 2.  See FIM for current functional status  Therapy/Group: Individual Therapy  Daneil Dan 07/08/2013, 11:23 AM

## 2013-07-08 NOTE — Progress Notes (Signed)
I have reviewed and agree with the treatment as reflected in this note. Cristan Scherzer, PT DPT  

## 2013-07-08 NOTE — Consult Note (Signed)
WOC consult Note Reason for Consult: evaluation of LLE wound, pt with hx of amputation 2-5th digits per Dr. Magnus Ivan (Jan. 2014 and further revisions Feb. 2014).  Also noted to have vascular surgery same extremity 12/13/12.  Presents with small open wound of the left foot along the amputation site. He reports that he has been under the care of the [facility] and they have  used collegen, silver, and Apligraft in the past.  He also reports the use of compression on the LLE.  He has 3+ pitting edema of the LLE, and I was only able to doppler PT and DP pulses.  New areas of concern for the patient is the great toe, with two areas of purple discoloration.  He feels this may some type of fungus, however I believe this to be related to his circulation, they are not open currently. Wound type: non healing surgical wound LLE Measurement: 1.0cm x 0.5cm x 0.2cm  Wound bed: pink, a little dusky grey centrally, may be stained from silver dressings Drainage (amount, consistency, odor) minimal serous, non purulent  Periwound:inatct with some scarring Dressing procedure/placement/frequency: add silver hydrofiber for the open wound to manage drainage and continue to address bioburdan on a non healing wound.  Add light compression with kerlx and Coban.  He was not using Unnas or Profore previously only dry boot. Change compression wrap and topical wound dressing 2x wk while inpatient. I will follow up on Thursday for wound care.   WOC team will follow along with you.  Jonise Weightman [facility] RN,CWOCN

## 2013-07-08 NOTE — Progress Notes (Signed)
Patient ID: Albert [name], male   DOB: 08/20/1961, 52 y.o.   MRN: [id number] Subjective/Complaints: 52 y.o. 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 Admitted 06/25/2013 with nonhealing ulcer to the right foot and Charcot collapse right foot. Underwent irrigation and debridement of right foot plantar abscess 06/25/2013 per Dr. Magnus Ivan. Postoperatively with hypotension and tachycardia. Cardiology services consulted with findings of elevated troponin levels of 10. Echocardiogram with distal septal hypokinesis with overall low-normal LVEF. Elevated troponin levels are likely related to underlying sepsis. Underwent cardiac catheterization 06/28/2013 with findings of triple vessel CAD NSTEMI secondary to demand ischemia and mild LV systolic dysfunction. Patient placed on medical management with aspirin therapy. Patient with ongoing ischemic changes right lower extremity and underwent right below-knee amputation 06/29/2013 per Dr. Lajoyce Corners. Cardiothoracic surgery followup to consider CABG after recovery from BKA. TEE completed 07/03/2013 any vegetation that was negative with ejection fraction 45-50% .patient currently remains on Ancef therapy per infectious disease for sepsis /MSSA bacteremia with plan for 28 day duration  Complains of right residual limb pain. No significant phantom limb pain Still having problems with bowels  Review of Systems  Constitutional: Negative for fever and chills.  Gastrointestinal: Positive for constipation.  All other systems reviewed and are negative.    Objective: Vital Signs: Blood pressure 116/70, pulse 80, temperature 97.5 F (36.4 C), temperature source Oral, resp. rate 20, height 5' 10.87" (1.8 m), weight 121.6 kg (268 lb 1.3 oz), SpO2 99.00%. No results  found. Results for orders placed during the hospital encounter of 07/03/13 (from the past 72 hour(s))  GLUCOSE, CAPILLARY     Status: Abnormal   Collection Time    07/05/13 11:34 AM      Result Value Range   Glucose-Capillary 288 (*) 70 - 99 mg/dL   Comment 1 Notify RN    GLUCOSE, CAPILLARY     Status: Abnormal   Collection Time    07/05/13  4:50 PM      Result Value Range   Glucose-Capillary 203 (*) 70 - 99 mg/dL   Comment 1 Notify RN    GLUCOSE, CAPILLARY     Status: Abnormal   Collection Time    07/05/13  8:53 PM      Result Value Range   Glucose-Capillary 189 (*) 70 - 99 mg/dL   Comment 1 Notify RN     Comment 2 Documented in Chart    GLUCOSE, CAPILLARY     Status: Abnormal   Collection Time    07/06/13  7:17 AM      Result Value Range   Glucose-Capillary 204 (*) 70 - 99 mg/dL   Comment 1 Notify RN    GLUCOSE, CAPILLARY     Status: Abnormal   Collection Time    07/06/13 11:24 AM      Result Value Range   Glucose-Capillary 222 (*) 70 - 99 mg/dL   Comment 1 Notify RN    GLUCOSE, CAPILLARY     Status: Abnormal   Collection Time    07/06/13  4:25 PM      Result Value Range   Glucose-Capillary 207 (*) 70 - 99 mg/dL   Comment 1 Notify RN    GLUCOSE, CAPILLARY     Status: Abnormal   Collection Time    07/06/13  9:11 PM      Result Value  Range   Glucose-Capillary 198 (*) 70 - 99 mg/dL  GLUCOSE, CAPILLARY     Status: Abnormal   Collection Time    07/07/13  6:55 AM      Result Value Range   Glucose-Capillary 201 (*) 70 - 99 mg/dL  GLUCOSE, CAPILLARY     Status: Abnormal   Collection Time    07/07/13 11:48 AM      Result Value Range   Glucose-Capillary 243 (*) 70 - 99 mg/dL   Comment 1 Notify RN    GLUCOSE, CAPILLARY     Status: Abnormal   Collection Time    07/07/13  4:23 PM      Result Value Range   Glucose-Capillary 179 (*) 70 - 99 mg/dL   Comment 1 Notify RN    GLUCOSE, CAPILLARY     Status: Abnormal   Collection Time    07/07/13  8:48 PM      Result Value  Range   Glucose-Capillary 162 (*) 70 - 99 mg/dL  GLUCOSE, CAPILLARY     Status: Abnormal   Collection Time    07/08/13  7:39 AM      Result Value Range   Glucose-Capillary 228 (*) 70 - 99 mg/dL   Comment 1 Notify RN       HEENT: normal Cardio: RRR and murmur Resp: CTA B/L and unlabored GI: BS positive Extremity:  Pulses positive and No Edema Skin:   Breakdown Left distal MCP #3 , 2cm oval with granulation tissue but foul odor and Wound C/D/I Neuro: Alert/Oriented, Cranial Nerve II-XII normal, Normal Sensory and Abnormal Motor 3-/5 ankle DF on left 4/5 bilateral hip flex, 5/5 B Delt,bi,tri,grip Musc/Skel:  Normal Gen: NAD   Assessment/Plan: 1. Functional deficits secondary to R BKA secondary to gangrene which require 3+ hours per day of interdisciplinary therapy in a comprehensive inpatient rehab setting. Physiatrist is providing close team supervision and 24 hour management of active medical problems listed below. Physiatrist and rehab team continue to assess barriers to discharge/monitor patient progress toward functional and medical goals. FIM: FIM - Bathing Bathing Steps Patient Completed: Chest;Right Arm;Left Arm;Abdomen;Right upper leg;Left upper leg Bathing: 3: Mod-Patient completes 5-7 32f 10 parts or 50-74%  FIM - Upper Body Dressing/Undressing Upper body dressing/undressing steps patient completed: Thread/unthread right sleeve of pullover shirt/dresss;Thread/unthread left sleeve of pullover shirt/dress;Put head through opening of pull over shirt/dress;Pull shirt over trunk Upper body dressing/undressing: 5: Set-up assist to: Obtain clothing/put away FIM - Lower Body Dressing/Undressing Lower body dressing/undressing: 1: Two helpers  FIM - Toileting Toileting steps completed by patient: Adjust clothing prior to toileting Toileting: 1: Two helpers  FIM - Diplomatic Services operational officer Devices: Psychiatrist Transfers: 1-Two helpers  FIM -  Banker Devices: Biochemist, clinical: 1: Two helpers  FIM - Locomotion: Wheelchair Distance: 50 Locomotion: Wheelchair: 2: Travels 50 - 149 ft with moderate assistance (Pt: 50 - 74%) FIM - Locomotion: Ambulation Ambulation/Gait Assistance: Not tested (comment) Locomotion: Ambulation: 0: Activity did not occur  Comprehension Comprehension Mode: Auditory Comprehension: 7-Follows complex conversation/direction: With no assist  Expression Expression Mode: Verbal Expression: 7-Expresses complex ideas: With no assist  Social Interaction Social Interaction: 7-Interacts appropriately with others - No medications needed.  Problem Solving Problem Solving: 7-Solves complex problems: Recognizes & self-corrects  Memory Memory: 7-Complete Independence: No helper  Medical Problem List and Plan:  1. Right BKA due to a Charcot foot/abscess, prior left CVA with residual right-sided weakness  2. DVT  Prophylaxis/Anticoagulation: SCDs left lower extremity.  3. Pain Management: Lyrica 75 mg twice a day, Oxycodone , titrate the dose. Monitor with increased mobility  4. Neuropsych: This patient is capable of making decisions on his own behalf.  5. CAD/NSTEMI. Continue aspirin therapy. Plan followup cardiothoracic surgery 6-8 weeks to consider CABG. Patient had chest pain or shortness of breath  6. ID. Sepsis/MSSA bacteremia. Continue Ancef as directed x28 days total initiated 06/25/2013.  7. Diabetes mellitus with peripheral neuropathy, uncontrolled. Hemoglobin A1c 8.2. Levemir 25  will titrate, NovoLog 3 times a day. Check blood sugars a.c. and at bedtime  8. Hypertension. Lopressor 50 mg twice a day. Monitor with increased mobility  9. Acute blood loss anemia. Latest hemoglobin 10.8. Followup CBC  10. Recent multi-toe amputation left foot. Continue wound care as advised. Monitor for increased ischemic changes. Need to be careful with  transfers or any attempts with gait. Followup vascular surgery , consult                woc regarding previous dressing change instruction,  11.  Peripheral edema ACE wrap, no diuretic due to hyponatremia    LOS (Days) 5 A FACE TO FACE EVALUATION WAS PERFORMED  Cesar Rogerson E 07/08/2013, 7:52 AM

## 2013-07-09 ENCOUNTER — Inpatient Hospital Stay (HOSPITAL_COMMUNITY): Payer: 59

## 2013-07-09 ENCOUNTER — Inpatient Hospital Stay (HOSPITAL_COMMUNITY): Payer: 59 | Admitting: *Deleted

## 2013-07-09 LAB — GLUCOSE, CAPILLARY: Glucose-Capillary: 112 mg/dL — ABNORMAL HIGH (ref 70–99)

## 2013-07-09 NOTE — Progress Notes (Signed)
Physical Therapy Session Note  Patient Details  Name: Albert [name] MRN: [id number] Date of Birth: 1961/07/24  Today's Date: 07/09/2013 Time: 0930-1030 and [id number] Time Calculation (min): 60 min and 45 min   Short Term Goals: Week 1:  PT Short Term Goal 1 (Week 1): Patient will perform bed mobility from flat surface without use of bedrails with supervision. PT Short Term Goal 2 (Week 1): Patient will perform bed<>chair transfers to bilateral sides with minA. PT Short Term Goal 3 (Week 1): Patient will perform sit<>stand transfer with RW with maxA x1. PT Short Term Goal 4 (Week 1): Patient will perform wheelchair mobility x50' with minA.  Skilled Therapeutic Interventions/Progress Updates:    AM Session: Patient received seated in wheelchair. Session focused on wheelchair mobility and transfers. Patient propelled wheelchair 50' supervision level, with BUE and LLE. Required verbal cueing for hand placement on wheel rims to facilitate strength of propulsion. Noted improvement in patients ability to propel with addition of thera-tubing to R wheel. Patient instructed in wheelchair<>mat table squat pivot transfers +2 totalA chair> slightly elevated mat (to L side), and minA slightly elevated mat>chair (to R side). Verbal/visual cueing provided for hand placement and for proper positioning on edge of mat with LLE on floor, prior to transfer. Patient instructed in sliding board transfers, wheelchair<>mat table x2 trials.  Discussed proper use and placement of sliding board with patient, including hand placement, placement of board between surfaces, and placement of board under patients bottom. Patient required therapist to place and remove board during transfers. Verbal/visual cueing provided for chair<>mat transfers to initiate lateral scooting along board and to continue scooting as patient tends to stop mid transfer, with tactile cueing for placement of hand onto mat/chair when moving along board to  prevent patients fingers from getting pinched. First slide board trial attempted going to slightly elevated mat, due to increased patient anxiety and frustration patient returned to chair and mat lowered for more level transfer. With transfer training, emphasis on energy conservation, patient given verbal cues to take larger scoots.  All sliding board transfers at [facility] level. Patient returned to room, seated in wheelchair with all needs within reach.   PM Session:  Patient received in rehab gym seated in wheelchair. Session focused on squat pivot transfers. Patient instructed in squat pivot transfers wheelchair<> level height mat x2 trials. First trial patient MaxA chair>mat (to R side), and minA mat to chair (to L side), second trial patient modA chair>mat (to R side) and mat>chair (to L side).  Verbal/visual cueing provided for hand placement and to remember to increase weight bearing through LLE during transfers.  Patient appears to have less difficulty with transfers to strong side (L side) and when he has armrest available for support. Patient instructed in squat pivot transfer wheelchair> elevated bed MaxA, with cueing for hand placement on bed and to increase weight bearing through LLE during transfer. Patient returned to room, supine in bed with all needs within reach.  Therapy Documentation Precautions:  Precautions Precautions: Fall Precaution Comments: watch HR Restrictions Weight Bearing Restrictions: No RLE Weight Bearing: Non weight bearing Other Position/Activity Restrictions: elevate operated limb, do not prop pillows under knee; try to position on LEFT side so that RIGHT hip can be in neutral/extension   Pain: Pain Assessment Pain Assessment: 0-10 Pain Score: 0-No pain Patients Stated Pain Goal: 3 Multiple Pain Sites: No    Locomotion : Wheelchair Mobility Distance: 50   See FIM for current functional status  Therapy/Group: Individual Therapy  Doralee Albino 07/09/2013, 11:09 AM

## 2013-07-09 NOTE — Progress Notes (Signed)
Occupational Therapy Session Note  Patient Details  Name: Albert [name] MRN: [id number] Date of Birth: 04-09-1961  Today's Date: 07/09/2013 Time: 4098-1191 and 1140-1150 and 1400-1430 Time Calculation (min): 60 min and 10 min and 30 min   Short Term Goals: Week 1:  OT Short Term Goal 1 (Week 1): Pt will complete LB dressing with mod assist  OT Short Term Goal 2 (Week 1): Pt will complete toilet transfer to drop arm BSC with mod assist  OT Short Term Goal 3 (Week 1): Pt will tolerate standing during 1 ADL with min assist for balance OT Short Term Goal 4 (Week 1): Pt will complete bathing task with min assist standing balance  Skilled Therapeutic Interventions/Progress Updates:    Session 1: Therapy session focused on dynamic standing and sitting balance, lateral leans, functional transfers, and activity tolerance during ADL retraining. Patient received supine in bed and completed sponge bath sitting eob with good dynamic sitting balance. Completed lateral leans for buttocks hygiene and pt required assist for thoroughness secondary to weakness in RUE. Pt very fatigued after lateral leans and required long rest break. Utilized reacher to thread LLE into pants and required assist for RLE. Attempted to complete lateral leans for clothing management however pt becoming very fatigued and frustrated. Pt reported high anxiety with standing with RW as he didn't think it felt "sturdy." Pt requesting to complete sit<>stand using w/c handles for support. Completed sit<>stand this tech with mod assist and pt able to manage pants around waist with increased time and max assist standing balance. Pt required long rest break after this. Provided education on benefits and safety with using lateral lean tech and pt agreed to continue practicing that as well. Completed squat pivot transfer bed>w/c with mod assist. Therapist applied therapy tubing around R wheel of w/c to assist with grip. Pt propelled self to sink to  complete grooming tasks and reported w/c was easier to propel. Therapist completed retrograde massage to RUE at end of session for edema control. Therapist spoke with nursing in regards to safety/technique with transfers and easing pt's anxiety prior to transfers.   Session 2: Pt sitting in w/c and fatigued. Encouraged pt to transfer to recliner chair rather then bed as he is very uncomfortable in bed. Nursing staff needed to complete care with pt reclined and pt very fatigued as well so agreed to recliner chair. Educated nursing on setup of recliner chair in prep for lateral scoot transfer. Pt completed with increased time and max assist. Pt required rest break during transfer secondary to fatigue. Pt's family/friends present and asking if therapy or hospital will assist financially for a ramp. Educated on speaking with [employer], insurance, etc since the hospital is not a resource for financially assistance to build ramp. Family/friends agreed. Pt left with nursing staff present.   Session 3: Pt received sitting in recliner chair and asleep. Pt reported long break in afternoon "really helped." Therapy session focused on UE strengthening, functional transfers, and activity tolerance. Attempted to completed squat pivot transfer recliner>w/c (to left) and pt completed 2 scoots with max-mod assist then asked for sliding board transfer. Required total assist for positioning of sliding board and completed transfer with + 2 assist (mod-max from one therapist and min from other staff member for safety). Pt very fatigued following transfer. Pt then propelled self approx 50 feet towards gym with increased time using BUE and LLE. Pt handed off to PT in gym. Pt required frequent rest breaks during therapy session.  Therapy Documentation Precautions:  Precautions Precautions: Fall Precaution Comments: watch HR Restrictions Weight Bearing Restrictions: No RLE Weight Bearing: Non weight bearing Other  Position/Activity Restrictions: elevate operated limb, do not prop pillows under knee; try to position on LEFT side so that RIGHT hip can be in neutral/extension General:   Vital Signs: Therapy Vitals Temp: 97.7 F (36.5 C) Temp src: Oral Pulse Rate: 78 Resp: 19 BP: 125/77 mmHg Patient Position, if appropriate: Lying Oxygen Therapy SpO2: 99 % O2 Device: None (Room air) Pain: Session 1: Patient reporting pain in RUE secondary to edema. Completed retrograde massage and pt reported this helped relieve pain.  Session 3: Patient reporting "a little" phantom pain in RLE.   See FIM for current functional status  Therapy/Group: Individual Therapy  Daneil Dan 07/09/2013, 9:23 AM

## 2013-07-09 NOTE — Consult Note (Signed)
INITIAL DIAGNOSTIC EVALUATION - [facility]   MEDICAL NECESSITY:  [name] was seen on the [facility] for a neurobehavioral status exam owing to the patient's diagnosis of BKA and prior stroke.   According to medical records, [name] was admitted to the rehab unit owing to functional deficits secondary to right below the knee amputation subsequent to gangrene. 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 He was reportedly admitted on 06/25/2013 with a non-healing ulcer to the right foot.   During today's visit, [name] was pleasant and cooperative. He admits to experiencing variable mood in reaction to his present medical situation. Fortunately, he has a good social support system that includes his wife and friends. And although he admits to some depressive symptoms, he does wish to take any medications for mood management. However, he said that if he finds out that he will be in the hospital for another 3-4 moths then that might change. Certain staff members have also purportedly noticed increased anxiety and wonder whether an anxiolytic might be helpful. It is my impression that this would likely be more detrimental than helpful because the patient was adamant about not wanting to take psychopharmacological medications and he already suffers from significant fatigue.   In regard to cognition, [name] reports suffering from chronic slowed processing speed since he suffered his stroke. No memory problems or other issues were endorsed. As mentioned, fatigue is a major barrier to therapy and he wished that he could complete most of his therapies in the morning. Thankfully, he believes that he is making some strides in therapy but he does not feel that this is true every day.   PROCEDURES ADMINISTERED: [1 unit [id number] on  07/08/13] Diagnostic clinical interview  Review of available records  Emotional & Behavioral Evaluation: [name] was appropriately dressed for season and situation, and he appeared tidy and well-groomed. Normal posture was noted. He was friendly and rapport was easily established. His speech was as expected and he was able to express ideas effectively. He seemed to understand test directions readily. His affect was appropriately modulated. Attention and motivation were good. Optimal test taking conditions were maintained.  From an emotional standpoint, [name] admits to transient depressive and anxiety-related symptoms in reaction to his present situation. He does not feel that these issues require medication management, nor does he want to take mood altering drugs. He described his current mood as "up and down." Suicidal/homicidal ideation, plan or intent was denied. No manic or hypomanic episodes were reported. The patient denied ever experiencing any auditory/visual hallucinations. No major behavioral or personality changes were endorsed.    Overall, [name] admits to suffering a mild-to-moderate degree of depression and anxiety in reaction to his present situation. He is also likely experiencing lasting cognitive changes subsequent to his prior stroke. However, the only barrier to therapy that he reports is extreme fatigue. As such, it may behoove the staff to attempt to consolidate his appointments to the morning as much as possible. Neuropsychology will continue to follow him for coping and ego support. We will also continue to evaluate the need for possible cognitive testing in the future if deemed necessary.   In light of these findings, the following recommendations are provided.    RECOMMENDATIONS  Recommendations for treatment team:    Since emotional factors are likely adversely impacting the patient's daily life, neuropsychology  will continue to provide ego support and supportive  psychotherapy.   DIAGNOSES:  Below the knee amputation Past cerebral infarction Adjustment disorder with mixed depression and anxiety (mild-to-moderate)   Debbe Mounts, Psy.D.  Clinical Neuropsychologist

## 2013-07-09 NOTE — Progress Notes (Signed)
I have reviewed and agree with the treatment as reflected in this note. Mirka Barbone, PT DPT  

## 2013-07-09 NOTE — Plan of Care (Signed)
Problem: RH SKIN INTEGRITY Goal: RH STG ABLE TO PERFORM INCISION/WOUND CARE W/ASSISTANCE STG Able To Perform Incision/Wound Care With Mod Assistance.  Outcome: Not Progressing Staff performs dressing changes

## 2013-07-09 NOTE — Progress Notes (Cosign Needed)
[facility]  NAME:  DEMETRES, PROCHNOW NO.:  [id number]  MEDICAL RECORD NO.:  [id number]      DATE OF BIRTH:  May 14, 1961  PHYSICIAN:  Joanne Gavel, M.D.              VISIT DATE:                                  OFFICE VISIT   Mr. Quadarius Henton is a 52 year old diabetic male, who is status post amputation of the 2nd, 3rd, 4th, and 5th toe.  He has recently undergone amputation of the right leg.  It is imperative that he not had damaged his remaining left foot and for this reason amongst several others, he is completely and permanently disabled.  He is not a candidate for any work that would require walking or standing.     Joanne Gavel, M.D.     RA/MEDQ  D:  07/09/2013  T:  07/09/2013  Job:  [id number]

## 2013-07-09 NOTE — Progress Notes (Signed)
Patient ID: Albert [name], male   DOB: 05-Oct-1961, 52 y.o.   MRN: [id number] Subjective/Complaints: 52 y.o. 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 Admitted 06/25/2013 with nonhealing ulcer to the right foot and Charcot collapse right foot. Underwent irrigation and debridement of right foot plantar abscess 06/25/2013 per Dr. Magnus Ivan. Postoperatively with hypotension and tachycardia. Cardiology services consulted with findings of elevated troponin levels of 10. Echocardiogram with distal septal hypokinesis with overall low-normal LVEF. Elevated troponin levels are likely related to underlying sepsis. Underwent cardiac catheterization 06/28/2013 with findings of triple vessel CAD NSTEMI secondary to demand ischemia and mild LV systolic dysfunction. Patient placed on medical management with aspirin therapy. Patient with ongoing ischemic changes right lower extremity and underwent right below-knee amputation 06/29/2013 per Dr. Lajoyce Corners. Cardiothoracic surgery followup to consider CABG after recovery from BKA. TEE completed 07/03/2013 any vegetation that was negative with ejection fraction 45-50% .patient currently remains on Ancef therapy per infectious disease for sepsis /MSSA bacteremia with plan for 28 day duration  Pain under control  Review of Systems  Constitutional: Negative for fever and chills.  Gastrointestinal: Positive for constipation.  All other systems reviewed and are negative.    Objective: Vital Signs: Blood pressure 125/77, pulse 78, temperature 97.7 F (36.5 C), temperature source Oral, resp. rate 19, height 5' 10.87" (1.8 m), weight 121.6 kg (268 lb 1.3 oz), SpO2 99.00%. No results found. Results for orders placed during the hospital encounter of 07/03/13 (from the past  72 hour(s))  GLUCOSE, CAPILLARY     Status: Abnormal   Collection Time    07/06/13 11:24 AM      Result Value Range   Glucose-Capillary 222 (*) 70 - 99 mg/dL   Comment 1 Notify RN    GLUCOSE, CAPILLARY     Status: Abnormal   Collection Time    07/06/13  4:25 PM      Result Value Range   Glucose-Capillary 207 (*) 70 - 99 mg/dL   Comment 1 Notify RN    GLUCOSE, CAPILLARY     Status: Abnormal   Collection Time    07/06/13  9:11 PM      Result Value Range   Glucose-Capillary 198 (*) 70 - 99 mg/dL  GLUCOSE, CAPILLARY     Status: Abnormal   Collection Time    07/07/13  6:55 AM      Result Value Range   Glucose-Capillary 201 (*) 70 - 99 mg/dL  GLUCOSE, CAPILLARY     Status: Abnormal   Collection Time    07/07/13 11:48 AM      Result Value Range   Glucose-Capillary 243 (*) 70 - 99 mg/dL   Comment 1 Notify RN    GLUCOSE, CAPILLARY     Status: Abnormal   Collection Time    07/07/13  4:23 PM      Result Value Range   Glucose-Capillary 179 (*) 70 - 99 mg/dL   Comment 1 Notify RN    GLUCOSE, CAPILLARY     Status: Abnormal   Collection Time    07/07/13  8:48 PM      Result Value Range   Glucose-Capillary 162 (*) 70 - 99 mg/dL  GLUCOSE, CAPILLARY     Status: Abnormal   Collection Time    07/08/13  7:39 AM      Result Value Range  Glucose-Capillary 228 (*) 70 - 99 mg/dL   Comment 1 Notify RN    GLUCOSE, CAPILLARY     Status: Abnormal   Collection Time    07/08/13 12:02 PM      Result Value Range   Glucose-Capillary 158 (*) 70 - 99 mg/dL   Comment 1 Notify RN    GLUCOSE, CAPILLARY     Status: Abnormal   Collection Time    07/08/13  4:35 PM      Result Value Range   Glucose-Capillary 101 (*) 70 - 99 mg/dL  GLUCOSE, CAPILLARY     Status: Abnormal   Collection Time    07/08/13  8:37 PM      Result Value Range   Glucose-Capillary 110 (*) 70 - 99 mg/dL  GLUCOSE, CAPILLARY     Status: Abnormal   Collection Time    07/09/13  7:24 AM      Result Value Range    Glucose-Capillary 179 (*) 70 - 99 mg/dL   Comment 1 Notify RN       HEENT: normal Cardio: RRR and murmur Resp: CTA B/L and unlabored GI: BS positive Extremity:  Pulses positive and No Edema Skin:   Breakdown Left distal MCP #3 , 2cm oval with granulation tissue but foul odor and Wound C/D/I Neuro: Alert/Oriented, Cranial Nerve II-XII normal, Normal Sensory and Abnormal Motor 3-/5 ankle DF on left 4/5 bilateral hip flex, 5/5 B Delt,bi,tri,grip Musc/Skel:  Normal Gen: NAD   Assessment/Plan: 1. Functional deficits secondary to R BKA secondary to gangrene which require 3+ hours per day of interdisciplinary therapy in a comprehensive inpatient rehab setting. Physiatrist is providing close team supervision and 24 hour management of active medical problems listed below. Physiatrist and rehab team continue to assess barriers to discharge/monitor patient progress toward functional and medical goals.  FIM: FIM - Bathing Bathing Steps Patient Completed: Chest;Right Arm;Left Arm;Abdomen;Left upper leg;Front perineal area;Right upper leg Bathing: 0: Activity did not occur  FIM - Upper Body Dressing/Undressing Upper body dressing/undressing steps patient completed: Thread/unthread right sleeve of pullover shirt/dresss;Thread/unthread left sleeve of pullover shirt/dress;Put head through opening of pull over shirt/dress;Pull shirt over trunk Upper body dressing/undressing: 0: Activity did not occur FIM - Lower Body Dressing/Undressing Lower body dressing/undressing: 0: Activity did not occur  FIM - Toileting Toileting steps completed by patient: Adjust clothing prior to toileting Toileting: 0: Activity did not occur  FIM - Diplomatic Services operational officer Devices: Psychiatrist Transfers: 0-Activity did not occur  FIM - Banker Devices: Arm rests Bed/Chair Transfer: 0: Activity did not occur  FIM - Locomotion:  Wheelchair Distance: 50 Locomotion: Wheelchair: 0: Activity did not occur FIM - Locomotion: Ambulation Ambulation/Gait Assistance: Not tested (comment) Locomotion: Ambulation: 0: Activity did not occur  Comprehension Comprehension Mode: Auditory Comprehension: 7-Follows complex conversation/direction: With no assist  Expression Expression Mode: Verbal Expression: 7-Expresses complex ideas: With no assist  Social Interaction Social Interaction: 7-Interacts appropriately with others - No medications needed.  Problem Solving Problem Solving: 7-Solves complex problems: Recognizes & self-corrects  Memory Memory: 7-Complete Independence: No helper  Medical Problem List and Plan:  1. Right BKA due to a Charcot foot/abscess, prior left CVA with residual right-sided weakness  2. DVT Prophylaxis/Anticoagulation: SCDs left lower extremity.  3. Pain Management: Lyrica 75 mg twice a day, Oxycodone , titrate the dose. Monitor with increased mobility  4. Neuropsych: This patient is capable of making decisions on his own behalf.  5. CAD/NSTEMI. Continue aspirin  therapy. Plan followup cardiothoracic surgery 6-8 weeks to consider CABG. Patient had chest pain or shortness of breath  6. ID. Sepsis/MSSA bacteremia. Continue Ancef as directed x28 days total initiated 06/25/2013.  7. Diabetes mellitus with peripheral neuropathy, uncontrolled. Hemoglobin A1c 8.2. Levemir 25  will titrate, NovoLog 3 times a day. Check blood sugars a.c. and at bedtime  8. Hypertension. Lopressor 50 mg twice a day. Monitor with increased mobility  9. Acute blood loss anemia. Latest hemoglobin 10.8. Followup CBC  10. Recent multi-toe amputation left foot. Continue wound care as advised. Monitor for increased ischemic changes. Need to be careful with transfers or any attempts with gait. Followup vascular surgery , consult                woc regarding previous dressing change instruction, appreciate note 11.  Peripheral edema  ACE wrap, no diuretic due to hyponatremia    LOS (Days) 6 A FACE TO FACE EVALUATION WAS PERFORMED  Albert [name] 07/09/2013, 7:45 AM

## 2013-07-09 NOTE — Plan of Care (Signed)
Problem: RH Ambulation Goal: LTG Patient will ambulate in controlled environment (PT) LTG: Patient will ambulate in a controlled environment, # of feet with assistance (PT).  Outcome: Not Applicable Date Met:  07/09/13 discharged 07/09/13 secondary due slow progress  Problem: RH Stairs Goal: LTG Patient will ambulate up and down stairs w/assist (PT) LTG: Patient will ambulate up and down # of stairs with assistance (PT)  Outcome: Not Applicable Date Met:  07/09/13 discharged 07/09/13 secondary due slow progress

## 2013-07-10 ENCOUNTER — Inpatient Hospital Stay (HOSPITAL_COMMUNITY): Payer: 59

## 2013-07-10 ENCOUNTER — Inpatient Hospital Stay (HOSPITAL_COMMUNITY): Payer: 59 | Admitting: *Deleted

## 2013-07-10 LAB — GLUCOSE, CAPILLARY
Glucose-Capillary: 143 mg/dL — ABNORMAL HIGH (ref 70–99)
Glucose-Capillary: 179 mg/dL — ABNORMAL HIGH (ref 70–99)
Glucose-Capillary: 119 mg/dL — ABNORMAL HIGH (ref 70–99)

## 2013-07-10 MED ORDER — SENNOSIDES-DOCUSATE SODIUM 8.6-50 MG PO TABS
2.0000 | ORAL_TABLET | Freq: Two times a day (BID) | ORAL | Status: DC
  Administered 2013-07-10 – 2013-07-14 (×8): 2 via ORAL
  Filled 2013-07-10 (×9): qty 2

## 2013-07-10 MED ORDER — TAMSULOSIN HCL 0.4 MG PO CAPS
0.4000 mg | ORAL_CAPSULE | Freq: Every day | ORAL | Status: DC
  Administered 2013-07-10 – 2013-07-14 (×5): 0.4 mg via ORAL
  Filled 2013-07-10 (×6): qty 1

## 2013-07-10 MED ORDER — BETHANECHOL CHLORIDE 25 MG PO TABS
25.0000 mg | ORAL_TABLET | Freq: Three times a day (TID) | ORAL | Status: DC
  Administered 2013-07-10 (×2): 25 mg via ORAL
  Filled 2013-07-10 (×6): qty 1

## 2013-07-10 NOTE — Progress Notes (Signed)
Patient ID: Albert [name], male   DOB: [birth year], 52 y.o.   MRN: [id number] Subjective/Complaints: 52 y.o. 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 Admitted 06/25/2013 with nonhealing ulcer to the right foot and Charcot collapse right foot. Underwent irrigation and debridement of right foot plantar abscess 06/25/2013 per Dr. Magnus Ivan. Postoperatively with hypotension and tachycardia. Cardiology services consulted with findings of elevated troponin levels of 10. Echocardiogram with distal septal hypokinesis with overall low-normal LVEF. Elevated troponin levels are likely related to underlying sepsis. Underwent cardiac catheterization 06/28/2013 with findings of triple vessel CAD NSTEMI secondary to demand ischemia and mild LV systolic dysfunction. Patient placed on medical management with aspirin therapy. Patient with ongoing ischemic changes right lower extremity and underwent right below-knee amputation 06/29/2013 per Dr. Lajoyce Corners. Cardiothoracic surgery followup to consider CABG after recovery from BKA. TEE completed 07/03/2013 any vegetation that was negative with ejection fraction 45-50% .patient currently remains on Ancef therapy per infectious disease for sepsis /MSSA bacteremia with plan for 28 day duration  Pain under control Constipation, discussed bowel regimen with RN Review of Systems  Constitutional: Negative for fever and chills.  Gastrointestinal: Positive for constipation.  All other systems reviewed and are negative.    Objective: Vital Signs: Blood pressure 128/85, pulse 90, temperature 97.7 F (36.5 C), temperature source Oral, resp. rate 18, height 5' 10.87" (1.8 m), weight 121.6 kg (268 lb 1.3 oz), SpO2 99.00%. No results found. Results for orders placed during the  hospital encounter of 07/03/13 (from the past 72 hour(s))  GLUCOSE, CAPILLARY     Status: Abnormal   Collection Time    07/07/13 11:48 AM      Result Value Range   Glucose-Capillary 243 (*) 70 - 99 mg/dL   Comment 1 Notify RN    GLUCOSE, CAPILLARY     Status: Abnormal   Collection Time    07/07/13  4:23 PM      Result Value Range   Glucose-Capillary 179 (*) 70 - 99 mg/dL   Comment 1 Notify RN    GLUCOSE, CAPILLARY     Status: Abnormal   Collection Time    07/07/13  8:48 PM      Result Value Range   Glucose-Capillary 162 (*) 70 - 99 mg/dL  GLUCOSE, CAPILLARY     Status: Abnormal   Collection Time    07/08/13  7:39 AM      Result Value Range   Glucose-Capillary 228 (*) 70 - 99 mg/dL   Comment 1 Notify RN    GLUCOSE, CAPILLARY     Status: Abnormal   Collection Time    07/08/13 12:02 PM      Result Value Range   Glucose-Capillary 158 (*) 70 - 99 mg/dL   Comment 1 Notify RN    GLUCOSE, CAPILLARY     Status: Abnormal   Collection Time    07/08/13  4:35 PM      Result Value Range   Glucose-Capillary 101 (*) 70 - 99 mg/dL  GLUCOSE, CAPILLARY     Status: Abnormal   Collection Time    07/08/13  8:37 PM      Result Value Range   Glucose-Capillary 110 (*) 70 - 99 mg/dL  GLUCOSE, CAPILLARY     Status: Abnormal   Collection Time    07/09/13  7:24 AM  Result Value Range   Glucose-Capillary 179 (*) 70 - 99 mg/dL   Comment 1 Notify RN    GLUCOSE, CAPILLARY     Status: Abnormal   Collection Time    07/09/13 11:38 AM      Result Value Range   Glucose-Capillary 176 (*) 70 - 99 mg/dL   Comment 1 Notify RN    GLUCOSE, CAPILLARY     Status: Abnormal   Collection Time    07/09/13  4:30 PM      Result Value Range   Glucose-Capillary 183 (*) 70 - 99 mg/dL   Comment 1 Notify RN    GLUCOSE, CAPILLARY     Status: Abnormal   Collection Time    07/09/13  8:19 PM      Result Value Range   Glucose-Capillary 112 (*) 70 - 99 mg/dL  GLUCOSE, CAPILLARY     Status: Abnormal   Collection  Time    07/10/13  7:34 AM      Result Value Range   Glucose-Capillary 143 (*) 70 - 99 mg/dL     HEENT: normal Cardio: RRR and murmur Resp: CTA B/L and unlabored GI: BS positive Extremity:  Pulses positive and No Edema Skin:   Breakdown Left distal MCP #3 , 2cm oval with granulation tissue but foul odor and Wound C/D/I Neuro: Alert/Oriented, Cranial Nerve II-XII normal, Normal Sensory and Abnormal Motor 3-/5 ankle DF on left 4/5 bilateral hip flex, 5/5 B Delt,bi,tri,grip Musc/Skel:  Normal Gen: NAD   Assessment/Plan: 1. Functional deficits secondary to R BKA secondary to gangrene which require 3+ hours per day of interdisciplinary therapy in a comprehensive inpatient rehab setting. Physiatrist is providing close team supervision and 24 hour management of active medical problems listed below. Physiatrist and rehab team continue to assess barriers to discharge/monitor patient progress toward functional and medical goals.  FIM: FIM - Bathing Bathing Steps Patient Completed: Chest;Right Arm;Left Arm;Abdomen;Left upper leg;Front perineal area;Right upper leg Bathing: 4: Min-Patient completes 8-9 34f 10 parts or 75+ percent  FIM - Upper Body Dressing/Undressing Upper body dressing/undressing steps patient completed: Thread/unthread right sleeve of pullover shirt/dresss;Thread/unthread left sleeve of pullover shirt/dress;Put head through opening of pull over shirt/dress;Pull shirt over trunk Upper body dressing/undressing: 5: Set-up assist to: Obtain clothing/put away FIM - Lower Body Dressing/Undressing Lower body dressing/undressing steps patient completed: Thread/unthread left pants leg;Pull pants up/down Lower body dressing/undressing: 3: Mod-Patient completed 50-74% of tasks (max assist standing balance)  FIM - Toileting Toileting steps completed by patient: Adjust clothing prior to toileting Toileting: 0: Activity did not occur  FIM - Diplomatic Services operational officer  Devices: Psychiatrist Transfers: 0-Activity did not occur  FIM - Banker Devices: Arm rests Bed/Chair Transfer: 5: Sit > Supine: Supervision (verbal cues/safety issues);3: Bed > Chair or W/C: Mod A (lift or lower assist);2: Chair or W/C > Bed: Max A (lift and lower assist)  FIM - Locomotion: Wheelchair Distance: 50 Locomotion: Wheelchair: 1: Total Assistance/staff pushes wheelchair (Pt<25%) FIM - Locomotion: Ambulation Ambulation/Gait Assistance: Not tested (comment) Locomotion: Ambulation: 0: Activity did not occur  Comprehension Comprehension Mode: Auditory Comprehension: 7-Follows complex conversation/direction: With no assist  Expression Expression Mode: Verbal Expression: 7-Expresses complex ideas: With no assist  Social Interaction Social Interaction: 7-Interacts appropriately with others - No medications needed.  Problem Solving Problem Solving: 7-Solves complex problems: Recognizes & self-corrects  Memory Memory: 7-Complete Independence: No helper  Medical Problem List and Plan:  1. Right BKA due to a  Charcot foot/abscess, prior left CVA with residual right-sided weakness  2. DVT Prophylaxis/Anticoagulation: SCDs left lower extremity.  3. Pain Management: Lyrica 75 mg twice a day, Oxycodone , titrate the dose. Monitor with increased mobility  4. Neuropsych: This patient is capable of making decisions on his own behalf.  5. CAD/NSTEMI. Continue aspirin therapy. Plan followup cardiothoracic surgery 6-8 weeks to consider CABG. Patient had chest pain or shortness of breath  6. ID. Sepsis/MSSA bacteremia. Continue Ancef as directed x28 days total initiated 06/25/2013.  7. Diabetes mellitus with peripheral neuropathy, uncontrolled. Hemoglobin A1c 8.2. Levemir 25  will titrate, NovoLog 3 times a day. Check blood sugars a.c. and at bedtime  8. Hypertension. Lopressor 50 mg twice a day. Monitor with increased mobility  9. Acute  blood loss anemia. Latest hemoglobin 10.8. Followup CBC  10. Recent multi-toe amputation left foot. Continue wound care as advised. Monitor for increased ischemic changes. Need to be careful with transfers or any attempts with gait. Followup vascular surgery , consult                woc regarding previous dressing change instruction, appreciate note 11.  Peripheral edema ACE wrap, no diuretic due to hyponatremia    LOS (Days) 7 A FACE TO FACE EVALUATION WAS PERFORMED  Markos Theil E 07/10/2013, 8:02 AM

## 2013-07-10 NOTE — Progress Notes (Signed)
Orthopedic Tech Progress Note Patient Details:  Albert [name] 1961/09/07 [id number]  Patient ID: [name], male   DOB: 10-23-1960, 52 y.o.   MRN: [id number]   Shawnie Pons 07/10/2013, 2:24 PMCalled advanced for replacement shrinkers .

## 2013-07-10 NOTE — Progress Notes (Signed)
Occupational Therapy Session Note  Patient Details  Name: Albert [name] MRN: [id number] Date of Birth: 03/01/1961  Today's Date: 07/10/2013 Time: 4098-1191 and 4782-9562 Time Calculation (min): 60 min and 30 min   Short Term Goals: Week 1:  OT Short Term Goal 1 (Week 1): Pt will complete LB dressing with mod assist  OT Short Term Goal 2 (Week 1): Pt will complete toilet transfer to drop arm BSC with mod assist  OT Short Term Goal 3 (Week 1): Pt will tolerate standing during 1 ADL with min assist for balance OT Short Term Goal 4 (Week 1): Pt will complete bathing task with min assist standing balance  Skilled Therapeutic Interventions/Progress Updates:    Session 1: Therapy session focused on lateral leans, functional transfers, activity tolerance during ADL retraining. Completed sponge bath sitting EOB with frequent rest breaks d/t fatigue. Utilized LH sponge with built up handle to complete buttocks hygiene using lateral lean technique. Pt reported needing to have BM. Completed squat pivot transfer (to L) bed>BC with min assist. Required total assist for thoroughness for hygiene using lateral lean tech. Transferred back to bed (to R) with mod assist. Completed dressing EOB with mod assist. Pt progressed to threading BLE into pants using reacher. Managed clothing around waist using lateral lean technique with increased time and assist to manage over R hip entirely secondary to weakness from previous CVA. Transferred squat pivot bed>w/c (to L) with min assist and competed oral care at sink at Mod I level. Pt very motivated at end of therapy session stating "I feel like I accomplished something."   Session 2: Therapy session focused on functional transfers, activity tolerance, and lateral weight shifting. Pt received supine in bed. Transferred bed>w/c to L with min assist using squat pivot transfer. Transferred w/c>mat table (with incline) to left side using squat pivot transfer with mod assist.  Engaged in lateral weight shifting to side lying on elbow positioning on each side to manage theraband up around waist simulating LB clothing management. Pt required rest break after every 2 lateral leans however able to manage up around waist and back down with increased time and min verbal cues. Pt sitting on mat table when PT arrived for therapy session.   Therapy Documentation Precautions:  Precautions Precautions: Fall Precaution Comments: watch HR Restrictions Weight Bearing Restrictions: No RLE Weight Bearing: Non weight bearing (Right BKA) Other Position/Activity Restrictions: elevate operated limb, do not prop pillows under knee; try to position on LEFT side so that RIGHT hip can be in neutral/extension General:   Vital Signs: Therapy Vitals Temp: 97.7 F (36.5 C) Temp src: Oral Pulse Rate: 90 Resp: 18 BP: 128/85 mmHg Patient Position, if appropriate: Lying Oxygen Therapy SpO2: 99 % O2 Device: None (Room air) Pain:  Session 1: Pt began reporting phantom limb pain in RLE towards end of therapy session.  Session 2: Pt reporting 4/10 phantom limb pain in RLE upon arrival as RN providing meds.   See FIM for current functional status  Therapy/Group: Individual Therapy  Daneil Dan 07/10/2013, 9:38 AM

## 2013-07-10 NOTE — Progress Notes (Signed)
Physical Therapy Session Note  Patient Details  Name: Albert [name] MRN: [id number] Date of Birth: 1961-02-19  Today's Date: 07/10/2013 Time: [phone] and [phone] Time Calculation (min): 58 min and 45 min   Short Term Goals: Week 1:  PT Short Term Goal 1 (Week 1): Patient will perform bed mobility from flat surface without use of bedrails with supervision. PT Short Term Goal 2 (Week 1): Patient will perform bed<>chair transfers to bilateral sides with minA. PT Short Term Goal 3 (Week 1): Patient will perform sit<>stand transfer with RW with maxA x1. PT Short Term Goal 4 (Week 1): Patient will perform wheelchair mobility x50' with minA.  Skilled Therapeutic Interventions/Progress Updates:   AM session:  Patient received seated in wheelchair. Session focused on wheelchair mobility, transfers, and LE strengthening. Patient propelled wheelchair 40' supervision level, patient demonstrated improved propulsion due to addition of thera-tubing and theraband to R wheel for added grip support. See details below. Patient MinA-ModA for wheelchair<>mat/bed transfers, patient demonstrates less difficulty transfering towards chair, due to ability to use armrest to pull on for momentum, see details below. Patient instructed in LE strengthening exercises including: seated LAQs 3# weight on LLE, supine SLR with 3# weight on LLE, sidelying hip ABD, sidelying hip extension, all 15x both LEs. Discussed completing hip extension in prone, patient showed increased anxiety and was not comfortable attempting prone position. Patient provided verbal/tactile cueing for slow and controlled movements with each exercise, emphasis on tactile cueing for sidelying hip ABD and extension for proper hip alignment. Discussed with patient, importance of not elevating foot of bed, in order to prevent flexion contracture in RLE, patient stated understanding. Patient returned to room, supine in bed, bed alarm on with all needs within  reach.   PM session:  Patient received in gym seated on mat table. Session focused on transfers and wheelchair mobility and management of wheelchair parts.  Patient was instructed in transfers elevated mat table > wheelchair MinA (to L side), with cueing for hand placement and sit on edge of mat so that LLE was on floor. Patient completed wheelchair>elevated bed transfer ModA (to R side), verbal and visual cueing for parking wheelchair, managing breaks, and for appropriate placement of hands during transfer. Discussion/demonstration with patient regarding management of wheelchair parts including, brakes, removal of armrests, and removal/placement of leg rests. Verbal/visual cueing provided for propelling wheelchair and parking at appropriate location next to mat table for transfer set up. Patient was able to demonstrate removal of armrest and was able to return armrest to beginning position with verbal/visual cueing for hand placement and location of release button. Verbal and visual cueing provided for attaching L leg rest onto chair, patient demonstrated successfully after a few attempts. Followed up with patient regarding installation of ramp at home and width of doorways in home to help determine appropriate wheelchair for patient at d/c, patient had not yet checked, but wrote himself a note to talk to his wife. Patient returned to room, supine in bed with bed alarm on, all needs within reach.    Therapy Documentation Precautions:  Precautions Precautions: Fall Precaution Comments: watch HR Restrictions Weight Bearing Restrictions: No RLE Weight Bearing: Non weight bearing (Right BKA) Other Position/Activity Restrictions: elevate operated limb, do not prop pillows under knee; try to position on LEFT side so that RIGHT hip can be in neutral/extension   Pain: Pain Assessment Pain Assessment: 0-10 Pain Score: 3  Pain Location: Leg Pain Orientation: Right Pain Descriptors / Indicators:  Aching Patients Stated  Pain Goal: 3 Multiple Pain Sites: No Mobility: Bed Mobility Bed Mobility: Rolling Right;Rolling Left;Supine to Sit;Sit to Supine Rolling Right: 5: Supervision Rolling Right Details: Verbal cues for precautions/safety Rolling Left: 5: Supervision Rolling Left Details: Verbal cues for precautions/safety Supine to Sit: 5: Supervision Sit to Supine: 5: Supervision Transfers Transfers: Yes Squat Pivot Transfers: 4: Min assist;3: Mod assist;With armrests Squat Pivot Transfer Details: Verbal cues for sequencing;Visual cues/gestures for precautions/safety;Verbal cues for precautions/safety;Verbal cues for technique;Tactile cues for placement Squat Pivot Transfer Details (indicate cue type and reason): Patient ModA wheelchair>level mat and MinA level mat>wheelchair, ModA transfer wheelchair>eleveated bed, verbal cueing for hand placement and increased weight bearing through LLE for all transfers.  Locomotion : Ambulation Ambulation: No Stairs / Additional Locomotion Stairs: No Corporate treasurer: Yes Wheelchair Assistance: 5: Financial planner Details: Verbal cues for precautions/safety;Verbal cues for Diplomatic Services operational officer: Both upper extremities;Left lower extremity Wheelchair Parts Management: Needs assistance Distance: 80     See FIM for current functional status  Therapy/Group: Individual Therapy  Doralee Albino 07/10/2013, 11:21 AM

## 2013-07-10 NOTE — Progress Notes (Signed)
I have reviewed and agree with the treatment as reflected in this note. Kelin Nixon, PT DPT  

## 2013-07-10 NOTE — Patient Care Conference (Signed)
Inpatient RehabilitationTeam Conference and Plan of Care Update Date: 07/10/2013   Time: 11;25 Am    Patient Name: Albert [name]      Medical Record Number: [id number]  Date of Birth: [birth year] Sex: Male         Room/Bed: 4M07C/4M07C-01 Payor Info: Payor: Advertising copywriter / Plan: Advertising copywriter / Product Type: *No Product type* /    Admitting Diagnosis: R BKA  Admit Date/Time:  07/03/2013  5:42 PM Admission Comments: No comment available   Primary Diagnosis:  Hx of BKA Principal Problem: Hx of BKA  Patient Active Problem List   Diagnosis Date Noted  . Hx of BKA 07/04/2013  . Cardiomyopathy, ischemic 07/03/2013  . Coronary atherosclerosis of native coronary artery 07/01/2013  . SEMI (subendocardial myocardial infarction) 06/26/2013  . AKI (acute kidney injury) 06/25/2013  . Foot abscess, right 06/25/2013  . Sepsis 06/25/2013  . Dehydration with hyponatremia 06/25/2013  . Leukocytosis, unspecified 06/25/2013  . Sepsis(995.91) 06/25/2013  . Aftercare following surgery of the circulatory system, NEC 02/11/2013  . Peripheral vascular disease, unspecified 12/31/2012  . Diabetes mellitus out of control 12/17/2012  . Essential hypertension, benign 12/17/2012  . Atherosclerotic peripheral vascular disease 12/17/2012  . Gangrene 12/08/2012  . Hypercholesteremia     Expected Discharge Date: Expected Discharge Date: 07/17/13  Team Members Present: Physician leading conference: Dr. Claudette Laws Social Worker Present: Dossie Der, LCSW Nurse Present: Gregor Hams, RN PT Present: Edman Circle, PT;Emily Marya Amsler, PT OT Present: Rosalio Loud, OT;Kris Gellert, Heath Lark, OT SLP Present: Fae Pippin, SLP PPS Coordinator present : Tora Duck, RN, CRRN     Current Status/Progress Goal Weekly Team Focus  Medical   pain control adequate, edema R>L side, chronic R HP  adequate pain control , reduce stump edema, promote wound healing  Adjust stump shrinker, involve wound  care RN   Bowel/Bladder   Continent of Bowel; LBM 07/09/13; I&O cath Q6h for urinary retention  to regain bladder function; timed toileting   monitor and assess urinary function and encourage timed toileting   Swallow/Nutrition/ Hydration     WFL        ADL's   max assist toilet transfer, mod assist functional transfers, mod assist LB dressing with max assist standing balance, min assist bathing, decreased activity tolerance, high anxiety   supervision for self-care and mod I toilet task and transfer   lateral leans for self-care tasks, functional transfers, activity tolerance, education, strengthening    Mobility   Supervision w/c mobility x50', bed mobility supervision HOB flat no rails; squat pivot transfers minA-+2 totalA depending on fatigue levels and surface height, introduce slide board transfer today minA for even transfer.   ModI goals for wheelchair level; supervision car transfer; ambulation and stairs goals d/c today.  Bed mobilty, wheelchair mobility, transfers, standing tolerance, dyanmic sitting balance, strengthening, activity tolerance.    Communication     [facility]        Safety/Cognition/ Behavioral Observations    WFL        Pain   pt denies pain  pain < 3  assess and monitor pain qshift and prn   Skin   L Foot with silver hydrofiber, Kerkix and Coban wrap; WOC nurse changes 2X/wk TTH next change date 9/25; R BKA stump with Telfa, Kerlix and Shrinker in place CDI  pt skin will remain free from further breakdown/ infection   monitor and assess skin integrity qshift and prn; dressing changes to be done as ordered and prn      *  See Care Plan and progress notes for long and short-term goals.  Barriers to Discharge: Endocarditis with IV abx, may need CABG post D/C, needs physical assist    Possible Resolutions to Barriers:  CVTS f/u, cont rehab program    Discharge Planning/Teaching Needs:  Wife works 7p-7a they have a 20yo and 43 yo, someone will be there at all times       Team Discussion:  Urinary retension-I&O cath every 6 hours.  WOC RN involved with dressings.  [facility] to look at stump shrinker.  Pain better managed.  Wheelchair level goals  Revisions to Treatment Plan:  Ambulation and stair goals discharged   Continued Need for Acute Rehabilitation Level of Care: The patient requires daily medical management by a physician with specialized training in physical medicine and rehabilitation for the following conditions: Daily direction of a multidisciplinary physical rehabilitation program to ensure safe treatment while eliciting the highest outcome that is of practical value to the patient.: Yes Daily medical management of patient stability for increased activity during participation in an intensive rehabilitation regime.: Yes Daily analysis of laboratory values and/or radiology reports with any subsequent need for medication adjustment of medical intervention for : Neurological problems;Post surgical problems;Cardiac problems  Jessicalynn Deshong, Lemar Livings 07/11/2013, 8:32 AM

## 2013-07-11 ENCOUNTER — Inpatient Hospital Stay (HOSPITAL_COMMUNITY): Payer: 59 | Admitting: *Deleted

## 2013-07-11 ENCOUNTER — Inpatient Hospital Stay (HOSPITAL_COMMUNITY): Payer: 59

## 2013-07-11 DIAGNOSIS — J810 Acute pulmonary edema: Secondary | ICD-10-CM | POA: Diagnosis present

## 2013-07-11 DIAGNOSIS — J189 Pneumonia, unspecified organism: Secondary | ICD-10-CM

## 2013-07-11 DIAGNOSIS — R0902 Hypoxemia: Secondary | ICD-10-CM

## 2013-07-11 DIAGNOSIS — A419 Sepsis, unspecified organism: Secondary | ICD-10-CM

## 2013-07-11 LAB — GLUCOSE, CAPILLARY
Glucose-Capillary: 157 mg/dL — ABNORMAL HIGH (ref 70–99)
Glucose-Capillary: 159 mg/dL — ABNORMAL HIGH (ref 70–99)

## 2013-07-11 LAB — BASIC METABOLIC PANEL
BUN: 82 mg/dL — ABNORMAL HIGH (ref 6–23)
CO2: 24 mEq/L (ref 19–32)
Calcium: 8.1 mg/dL — ABNORMAL LOW (ref 8.4–10.5)
Creatinine, Ser: 1.36 mg/dL — ABNORMAL HIGH (ref 0.50–1.35)
GFR calc Af Amer: 69 mL/min — ABNORMAL LOW (ref >90)
Glucose, Bld: 245 mg/dL — ABNORMAL HIGH (ref 70–99)
Potassium: 5 mEq/L (ref 3.5–5.1)
Sodium: 127 mEq/L — ABNORMAL LOW (ref 135–145)

## 2013-07-11 LAB — URINALYSIS, ROUTINE W REFLEX MICROSCOPIC
Glucose, UA: NEGATIVE mg/dL
Hgb urine dipstick: NEGATIVE
Leukocytes, UA: NEGATIVE
Specific Gravity, Urine: 1.017 (ref 1.005–1.030)
Urobilinogen, UA: 0.2 mg/dL (ref 0.0–1.0)

## 2013-07-11 LAB — OSMOLALITY, URINE: Osmolality, Ur: 422 mOsm/kg (ref 390–1090)

## 2013-07-11 LAB — SODIUM, URINE, RANDOM: Sodium, Ur: 21 mEq/L

## 2013-07-11 LAB — CBC
Hemoglobin: 8.2 g/dL — ABNORMAL LOW (ref 13.0–17.0)
MCH: 26.3 pg (ref 26.0–34.0)
MCHC: 32.7 g/dL (ref 30.0–36.0)
MCV: 80.4 fL (ref 78.0–100.0)
RBC: 3.12 MIL/uL — ABNORMAL LOW (ref 4.22–5.81)

## 2013-07-11 LAB — TROPONIN I
Troponin I: <0.3 ng/mL (ref <0.30)
Troponin I: <0.3 ng/mL (ref <0.30)

## 2013-07-11 MED ORDER — MENTHOL 3 MG MT LOZG
1.0000 | LOZENGE | OROMUCOSAL | Status: DC | PRN
  Filled 2013-07-11: qty 9

## 2013-07-11 MED ORDER — DM-GUAIFENESIN ER 30-600 MG PO TB12
1.0000 | ORAL_TABLET | Freq: Two times a day (BID) | ORAL | Status: DC
  Administered 2013-07-11 – 2013-07-14 (×7): 1 via ORAL
  Filled 2013-07-11 (×9): qty 1

## 2013-07-11 MED ORDER — ALBUTEROL SULFATE (5 MG/ML) 0.5% IN NEBU
2.5000 mg | INHALATION_SOLUTION | Freq: Four times a day (QID) | RESPIRATORY_TRACT | Status: DC
  Administered 2013-07-11 – 2013-07-13 (×8): 2.5 mg via RESPIRATORY_TRACT
  Filled 2013-07-11 (×8): qty 0.5

## 2013-07-11 MED ORDER — VANCOMYCIN HCL 10 G IV SOLR
1250.0000 mg | Freq: Two times a day (BID) | INTRAVENOUS | Status: DC
  Administered 2013-07-12 – 2013-07-13 (×4): 1250 mg via INTRAVENOUS
  Filled 2013-07-11 (×8): qty 1250

## 2013-07-11 MED ORDER — GUAIFENESIN ER 600 MG PO TB12
600.0000 mg | ORAL_TABLET | Freq: Two times a day (BID) | ORAL | Status: DC
  Administered 2013-07-11 – 2013-07-13 (×5): 600 mg via ORAL
  Filled 2013-07-11 (×7): qty 1

## 2013-07-11 MED ORDER — FUROSEMIDE 10 MG/ML IJ SOLN
40.0000 mg | Freq: Two times a day (BID) | INTRAMUSCULAR | Status: AC
  Administered 2013-07-11 – 2013-07-12 (×2): 40 mg via INTRAVENOUS
  Filled 2013-07-11 (×2): qty 4

## 2013-07-11 MED ORDER — LEVOFLOXACIN 500 MG PO TABS
500.0000 mg | ORAL_TABLET | Freq: Every day | ORAL | Status: DC
  Administered 2013-07-11 – 2013-07-13 (×3): 500 mg via ORAL
  Filled 2013-07-11 (×5): qty 1

## 2013-07-11 MED ORDER — ALBUTEROL SULFATE (5 MG/ML) 0.5% IN NEBU
2.5000 mg | INHALATION_SOLUTION | RESPIRATORY_TRACT | Status: DC | PRN
  Administered 2013-07-14: 2.5 mg via RESPIRATORY_TRACT
  Filled 2013-07-11: qty 0.5

## 2013-07-11 MED ORDER — PIPERACILLIN-TAZOBACTAM 3.375 G IVPB
3.3750 g | Freq: Three times a day (TID) | INTRAVENOUS | Status: DC
  Administered 2013-07-11: 3.375 g via INTRAVENOUS
  Filled 2013-07-11 (×5): qty 50

## 2013-07-11 MED ORDER — VANCOMYCIN HCL 10 G IV SOLR
2000.0000 mg | Freq: Once | INTRAVENOUS | Status: AC
  Administered 2013-07-11: 2000 mg via INTRAVENOUS
  Filled 2013-07-11: qty 2000

## 2013-07-11 MED ORDER — FUROSEMIDE 40 MG PO TABS
40.0000 mg | ORAL_TABLET | Freq: Every day | ORAL | Status: AC
  Administered 2013-07-11: 40 mg via ORAL
  Filled 2013-07-11: qty 1

## 2013-07-11 NOTE — Consult Note (Signed)
WOC follow up Wound type: non healing wound at surgical site, left foot Wound bed:dry, yellowish wound bed Drainage (amount, consistency, odor) none Periwound: intact, no changes to the discoloration of the remaining great toe.  Noted some similar discoloration of the left knee today (4-5 scattered purple areas), not open. Dressing procedure/placement/frequency: Continue mild compression and topical wound care for the area on the left foot.  Change weekly. WOC will plan to reevaluate next week.     WOC will follow along with you for wound management Skyler Dusing [facility] RN,CWOCN [phone]

## 2013-07-11 NOTE — Progress Notes (Signed)
I have reviewed and agree with the treatment as reflected in this note. Johonna Binette, PT DPT  

## 2013-07-11 NOTE — Progress Notes (Signed)
patient sleeping most of afternoon .  Patient appears much better . Patient remains on 2 liter of o2 via [place] . Reports minimal shortness of breath at 1900. patient up and visiting with family . Lungs continue with crackles but denies significant shortness of breath . Continue with plan of care .             Cleotilde Neer

## 2013-07-11 NOTE — Progress Notes (Signed)
Occupational Therapy Session Note  Patient Details  Name: Albert [name] MRN: [id number] Date of Birth: 03-Aug-1961  Today's Date: 07/11/2013 Time: 1000-1015 Time Calculation (min): 15 min  Short Term Goals: Week 1:  OT Short Term Goal 1 (Week 1): Pt will complete LB dressing with mod assist  OT Short Term Goal 2 (Week 1): Pt will complete toilet transfer to drop arm BSC with mod assist  OT Short Term Goal 3 (Week 1): Pt will tolerate standing during 1 ADL with min assist for balance OT Short Term Goal 4 (Week 1): Pt will complete bathing task with min assist standing balance  Skilled Therapeutic Interventions/Progress Updates: Therapeutic activities with emphasis on re-education on edema control at R-UE.  Patient tolerated 15 minutes of OT but reiterated complaint of congestion, difficulty breathing with unproductive cough, and generalized weakness/heaviness.  Patient deferred ADL session planned or any activity requiring mobiltiy.   While remaining supine in bed with HOB elevated, patient received instruction on need to elevate R-UE to reduce moderate edema at right upper extremity.  Patient provided stress ball to encourage attention to right UE and to prevent progression of edema.     Therapy Documentation Precautions:  Precautions Precautions: Fall Precaution Comments: watch HR Restrictions Weight Bearing Restrictions: No RLE Weight Bearing: Non weight bearing (Right BKA) Other Position/Activity Restrictions: elevate operated limb, do not prop pillows under knee; try to position on LEFT side so that RIGHT hip can be in neutral/extension  General: General Amount of Missed OT Time (min): 45 Minutes  Vital Signs: Therapy Vitals BP: 130/88 mmHg  Pain: Pain Assessment Pain Assessment: Faces Faces Pain Scale: Hurts even more Pain Location: Generalized Pain Descriptors / Indicators: Heaviness Pain Frequency: Intermittent Pain Onset: On-going Patients Stated Pain Goal: 3 Pain  Intervention(s): Medication (See eMAR);RN made aware Multiple Pain Sites: No  See FIM for current functional status  Therapy/Group: Individual Therapy  Georgeanne Nim 07/11/2013, 10:34 AM

## 2013-07-11 NOTE — Progress Notes (Signed)
I have reviewed and agree with the treatment as reflected in this note. Maley Venezia, PT DPT  

## 2013-07-11 NOTE — Progress Notes (Signed)
Physical Therapy Note  Patient Details  Name: Albert [name] MRN: [id number] Date of Birth: [birth year] Today's Date: 07/11/2013  Patient missed 60 minutes of skilled physical therapy, secondary to being transferred off unit for chest x-ray. Will follow up as able.    Doralee Albino 07/11/2013, 10:39 AM

## 2013-07-11 NOTE — Progress Notes (Signed)
ANTIBIOTIC CONSULT NOTE - INITIAL  Pharmacy Consult for Vancomycin/ Zosyn Indication: pneumonia  Allergies  Allergen Reactions  . Amoxicillin     vomiting    Patient Measurements: Height: 5' 10.87" (180 cm) Weight: 290 lb 4.8 oz (131.679 kg) IBW/kg (Calculated) : 74.99 Adjusted body weight: 98 kg   Vital Signs: Temp: 97.5 F (36.4 C) (09/25 1439) Temp src: Oral (09/25 1439) BP: 121/87 mmHg (09/25 1439) Pulse Rate: 84 (09/25 1439) Intake/Output from previous day: 09/24 0701 - 09/25 0700 In: 2795 [P.O.:2795] Out: 2400 [Urine:2400] Intake/Output from this shift: Total I/O In: 480 [P.O.:480] Out: 340 [Urine:340]  Labs:  Recent Labs  07/11/13 1050  WBC 10.7*  HGB 8.2*  PLT 448*  CREATININE 1.36*   Estimated Creatinine Clearance: 89.8 ml/min (by C-G formula based on Cr of 1.36). No results found for this basename: VANCOTROUGH, Leodis Binet, VANCORANDOM, GENTTROUGH, GENTPEAK, GENTRANDOM, TOBRATROUGH, TOBRAPEAK, TOBRARND, AMIKACINPEAK, AMIKACINTROU, AMIKACIN,  in the last 72 hours   Microbiology: Recent Results (from the past 720 hour(s))  CULTURE, BLOOD (ROUTINE X 2)     Status: None   Collection Time    06/26/13  1:00 AM      Result Value Range Status   Specimen Description BLOOD RIGHT ARM   Final   Special Requests     Final   Value: BOTTLES DRAWN AEROBIC AND ANAEROBIC  ,   Culture  Setup Time     Final   Value: 06/26/2013 04:11     Performed at [facility]   Culture     Final   Value: STAPHYLOCOCCUS AUREUS     Note: SUSCEPTIBILITIES PERFORMED ON PREVIOUS CULTURE WITHIN THE LAST 5 DAYS.     Note: Gram Stain Report Called to,Read Back By and Verified With: STACY PHELPS 06/28/13 1155 BY SMITHERSJ     Performed at [facility]   Report Status 06/30/2013 FINAL   Final  CULTURE, BLOOD (ROUTINE X 2)     Status: None   Collection Time    06/26/13  1:08 AM      Result Value Range Status   Specimen Description BLOOD RIGHT ARM   Final    Special Requests BOTTLES DRAWN AEROBIC AND ANAEROBIC    Final   Culture  Setup Time     Final   Value: 06/26/2013 04:10     Performed at [facility]   Culture     Final   Value: STAPHYLOCOCCUS AUREUS     Note: RIFAMPIN AND GENTAMICIN SHOULD NOT BE USED AS SINGLE DRUGS FOR TREATMENT OF STAPH INFECTIONS.     Note: Gram Stain Report Called to,Read Back By and Verified With: JOHN HUNT ON 06/28/2013 AT 8:21P BY Serafina Mitchell     Performed at [facility]   Report Status 06/30/2013 FINAL   Final   Organism ID, Bacteria STAPHYLOCOCCUS AUREUS   Final  MRSA PCR SCREENING     Status: None   Collection Time    06/26/13  8:08 AM      Result Value Range Status   MRSA by PCR NEGATIVE  NEGATIVE Final   Comment:            The GeneXpert MRSA Assay (FDA     approved for NASAL specimens     only), is one component of a     comprehensive MRSA colonization     surveillance program. It is not     intended to diagnose MRSA     infection nor  to guide or     monitor treatment for     MRSA infections.  URINE CULTURE     Status: None   Collection Time    06/28/13  6:05 PM      Result Value Range Status   Specimen Description URINE, CATHETERIZED   Final   Special Requests Normal   Final   Culture  Setup Time     Final   Value: 06/28/2013 18:30     Performed at [facility] Count     Final   Value: NO GROWTH     Performed at [facility]   Culture     Final   Value: NO GROWTH     Performed at [facility]   Report Status 06/30/2013 FINAL   Final  CULTURE, BLOOD (ROUTINE X 2)     Status: None   Collection Time    06/29/13  3:10 PM      Result Value Range Status   Specimen Description BLOOD RIGHT ANTECUBITAL   Final   Special Requests BOTTLES DRAWN AEROBIC AND ANAEROBIC 10CC   Final   Culture  Setup Time     Final   Value: 06/29/2013 21:30     Performed at [facility]   Culture     Final   Value: NO GROWTH 5 DAYS     Performed at [facility]   Report Status 07/05/2013 FINAL   Final  CULTURE, BLOOD (ROUTINE X 2)     Status: None   Collection Time    06/29/13  3:20 PM      Result Value Range Status   Specimen Description BLOOD RIGHT HAND   Final   Special Requests BOTTLES DRAWN AEROBIC AND ANAEROBIC 10CC   Final   Culture  Setup Time     Final   Value: 06/29/2013 21:31     Performed at [facility]   Culture     Final   Value: NO GROWTH 5 DAYS     Performed at [facility]   Report Status 07/05/2013 FINAL   Final    Medical History: Past Medical History  Diagnosis Date  . Hypertension   . Diabetes mellitus without complication ~2000    last HbA1c ~9  . Hypercholesteremia   . CVA (cerebral infarction) 2011    R hand deficit  . Chronic sinusitis   . Allergic rhinitis   . Chronic cough   . Complication of anesthesia     couldn't swallow and talk  . Stroke     Medications:  Scheduled:  . albuterol  2.5 mg Nebulization Q6H  . aspirin  81 mg Oral Daily  . dextromethorphan-guaiFENesin  1 tablet Oral BID  . furosemide  40 mg Intravenous Q12H  . guaiFENesin  600 mg Oral BID  . insulin aspart  0-15 Units Subcutaneous TID WC  . insulin aspart  8 Units Subcutaneous TID WC  . insulin detemir  35 Units Subcutaneous Daily  . levofloxacin  500 mg Oral Daily  . metoprolol tartrate  50 mg Oral BID  . pantoprazole  40 mg Oral Daily  . pregabalin  75 mg Oral BID  . senna-docusate  2 tablet Oral BID  . simvastatin  40 mg Oral QPM  . tamsulosin  0.4 mg Oral QPC supper   Assessment: 52 y.o. male w/ hx of uncontrolled DM w/ peripheral neuropathy, HTN, CVA initially admitted to [facility] 9/9 with  non healing RLE ulcer. Ultimately underwent R BKA 9/13, cardiac cath 06/28/13 w/findings of triple vessel CAD NSTEMI secondary to demand ischemia and mild LV systolic dysfunction.  Currently receiving Ancef for MSSA bacteremia (neg TEE).  Patient was progressing well in [facility] inpt rehab unit until today  9/25/14when he developed acute onset SOB. PCCM consulted and has ordered switching from Ancef to IV vancomycin and zosyn to cover ?HCAP.   Weight 131.7 kg,  SCr =1.36 today,  CrCl ~89 ml/min Afebrile, WBC 10.7K   Goal of Therapy:  Vancomycin trough level 15-20 mcg/ml  Plan:  Zosyn 3.375 g IV q8h (4hr infusion of each dose) Vancomycin 2 gm IV x1 loading dose then 1250 mg IV q12h. Monitor clinical status, renal function, order vancomycin steady state trough if needed.    Noah Delaine, RPh Clinical Pharmacist Pager: [phone] 07/11/2013,5:20 PM

## 2013-07-11 NOTE — Progress Notes (Signed)
I have reviewed and agree with the treatment as reflected in this note. Emmaleigh Longo, PT DPT  

## 2013-07-11 NOTE — Progress Notes (Signed)
Physical Therapy Note  Patient Details  Name: Albert [name] MRN: [id number] Date of Birth: Feb 26, 1961 Today's Date: 07/11/2013  Patient missed 45 minutes skilled physical therapy, secondary to patient report of feeling ill/fatigued and awaiting a breathing treatment. Patient exhibiting productive cough. Attempted bed level exercises but patient still refused. Patient notified of scheduled therapy session later this afternoon. Will follow up as able.   Doralee Albino 07/11/2013, 2:35 PM

## 2013-07-11 NOTE — Progress Notes (Signed)
Physical Therapy Session Note  Patient Details  Name: Albert [name] MRN: [id number] Date of Birth: 19-Nov-1960  Today's Date: 07/11/2013 Time: 4098-1191 Time Calculation (min): 30 min  Short Term Goals: Week 1:  PT Short Term Goal 1 (Week 1): Patient will perform bed mobility from flat surface without use of bedrails with supervision. PT Short Term Goal 2 (Week 1): Patient will perform bed<>chair transfers to bilateral sides with minA. PT Short Term Goal 3 (Week 1): Patient will perform sit<>stand transfer with RW with maxA x1. PT Short Term Goal 4 (Week 1): Patient will perform wheelchair mobility x50' with minA.  Skilled Therapeutic Interventions/Progress Updates:   Patient received asleep in bed, easily aroused. Patient reports that he had his breathing treatment and "it feels like it helped a little bit", but patient is still very fatigued. Session focused on therapeutic bed exercises.  Patient instructed in supine bed exercises including: SLR, quad sets, glut sets, hip ABD, and knee to chest, all BLE 10x each exercise. Patient required verbal cueing for exercises instruction and to continue breathing during exercises. Patient had non-productive coughing throughout session. Ended session with patient supine in bed, bed alarm on with all needs within reach.   Therapy Documentation Precautions:  Precautions Precautions: Fall Precaution Comments: watch HR Restrictions Weight Bearing Restrictions: No RLE Weight Bearing: Non weight bearing (Right BKA) Other Position/Activity Restrictions: elevate operated limb, do not prop pillows under knee; try to position on LEFT side so that RIGHT hip can be in neutral/extension Pain: Pain Assessment Pain Assessment: No/denies pain  See FIM for current functional status  Therapy/Group: Individual Therapy  Doralee Albino 07/11/2013, 4:09 PM

## 2013-07-11 NOTE — Consult Note (Addendum)
PULMONARY  / CRITICAL CARE MEDICINE  Name: Albert [name] MRN: [id number] DOB: 1961-08-03    ADMISSION DATE:  07/03/2013 CONSULTATION DATE:  07/11/13  REFERRING MD :  Wynn Banker  PRIMARY SERVICE:  Rehab  CHIEF COMPLAINT:  SOB  BRIEF PATIENT DESCRIPTION: 52 y.o. male w/ hx of uncontrolled DM w/ peripheral neuropathy, HTN, CVA initially admitted to [facility] 9/9 with non healing RLE ulcer. Ultimately underwent R BKA 9/13. Post op with hypotension and tachycardia. Underwent cardiac catheterization 06/28/13 w/ findings of triple vessel CAD NSTEMI secondary to demand ischemia and mild LV systolic dysfunction.  Course also c/b MSSA bacteremia (neg TEE) for which he is still receiving 28 total days Ancef. He was d/c to [facility] inpt rehab where he was progressing well when he developed acute onset SOB 9/25 and PCCM consulted.   SIGNIFICANT EVENTS / STUDIES:  9/17 TEE>> EF 45-50%, mod MR, septal hypokinesis   LINES / TUBES: LUE PICC >>>  CULTURES: Urine 9/25>>>  ANTIBIOTICS: Ancef (MSSA bacteremia)  9/17>>> (28 days total)  HISTORY OF PRESENT ILLNESS:  52 y.o. male w/ hx of uncontrolled DM w/ peripheral neuropathy, HTN, CVA initially admitted to [facility] 9/9 with non healing RLE ulcer. Ultimately underwent R BKA 9/13. Post op with hypotension and tachycardia. Underwent cardiac catheterization 06/28/13 w/ findings of triple vessel CAD NSTEMI secondary to demand ischemia and mild LV systolic dysfunction.  Course also c/b MSSA bacteremia (neg TEE) for which he is still receiving 28 total days Ancef. He was d/c to [facility] inpt rehab where he was progressing well when he developed acute onset SOB 9/25 and PCCM consulted.   Pt states he was feeling fine until yesterday evening when he became acutely SOB right after eating dinner.  He denies difficulty swallowing.  His SOB is somewhat improved this afternoon but was "very bad" this morning.  He feels better after lasix and i/o cath. Does endorse a cough  productive of white sputum. Denies chest pain, hemoptysis, headache, n/v/d.   PAST MEDICAL HISTORY :  Past Medical History  Diagnosis Date  . Hypertension   . Diabetes mellitus without complication ~2000    last HbA1c ~9  . Hypercholesteremia   . CVA (cerebral infarction) 2011    R hand deficit  . Chronic sinusitis   . Allergic rhinitis   . Chronic cough   . Complication of anesthesia     couldn't swallow and talk  . Stroke    Past Surgical History  Procedure Laterality Date  . 4th toe amputation Left Jan. 2014    4th toe in [place]  . Tonsillectomy and adenoidectomy    . Penile prosthesis implant    . I&d extremity Left 12/08/2012    Procedure: IRRIGATION AND DEBRIDEMENT EXTREMITY;  Surgeon: Kathryne Hitch, MD;  Location: [facility] OR;  Service: Orthopedics;  Laterality: Left;  . Amputation Left 12/08/2012    Procedure: AMPUTATION DIGIT;  Surgeon: Kathryne Hitch, MD;  Location: [facility] OR;  Service: Orthopedics;  Laterality: Left;  3rd toe amputation possible 5th toe amputation  . I&d extremity Left 12/11/2012    Procedure: REPEAT I&D LEFT FOOT;  Surgeon: Kathryne Hitch, MD;  Location: [facility] OR;  Service: Orthopedics;  Laterality: Left;  . Amputation Left 12/11/2012    Procedure: Amputation of 2nd Toe;  Surgeon: Kathryne Hitch, MD;  Location: [facility] OR;  Service: Orthopedics;  Laterality: Left;  . Bypass graft popliteal to tibial Left 12/13/2012    Procedure: BYPASS GRAFT POPLITEAL TO TIBIAL;  Surgeon: Nada Libman, MD;  Location: [facility] OR;  Service: Vascular;  Laterality: Left;  Left Popliteal to Posterior Tibial Bypass Graft with reversed saphenous vein graft  . Incision and drainage Right 06/25/2013    Procedure: INCISION AND DRAINAGE RIGHT FOOT;  Surgeon: Kathryne Hitch, MD;  Location: [facility] ORS;  Service: Orthopedics;  Laterality: Right;  . Amputation Right 06/29/2013    Procedure: AMPUTATION BELOW KNEE;  Surgeon: Nadara Mustard, MD;  Location: [facility] OR;  Service:  Orthopedics;  Laterality: Right;  . Tee without cardioversion [name] 07/03/2013    Procedure: TRANSESOPHAGEAL ECHOCARDIOGRAM (TEE);  Surgeon: Laurey Morale, MD;  Location: [facility] ENDOSCOPY;  Service: Cardiovascular;  Laterality: [name];   Prior to Admission medications   Medication Sig Start Date End Date Taking? Authorizing Provider  aspirin 81 MG tablet Take 81 mg by mouth daily.   Yes Historical Provider, MD  ceFAZolin (ANCEF) 2-3 GM-% SOLR Inject 50 mLs (2 g total) into the vein every 8 (eight) hours. 07/03/13 07/24/13 Yes Penny Pia, MD  docusate sodium 100 MG CAPS Take 100 mg by mouth 2 (two) times daily. 07/03/13  Yes Penny Pia, MD  fentaNYL (SUBLIMAZE) 0.05 MG/ML injection Inject 1-2 mLs (50-100 mcg total) into the vein every 2 (two) hours as needed for severe pain (or RASS > 0). 07/03/13  Yes Penny Pia, MD  glyBURIDE-metformin (GLUCOVANCE) 5-500 MG per tablet Take 2 tablets by mouth 2 (two) times daily.   Yes Historical Provider, MD  insulin aspart (NOVOLOG FLEXPEN) 100 UNIT/ML injection Inject 12 Units into the skin 3 (three) times daily. Take every time he eats per patient   Yes Historical Provider, MD  insulin detemir (LEVEMIR) 100 UNIT/ML injection Inject 0.25 mLs (25 Units total) into the skin every evening. 07/03/13  Yes Penny Pia, MD  lisinopril (PRINIVIL,ZESTRIL) 5 MG tablet Take 1 tablet (5 mg total) by mouth daily. 07/03/13  Yes Penny Pia, MD  metoCLOPramide (REGLAN) 5 MG tablet Take 1-2 tablets (5-10 mg total) by mouth every 8 (eight) hours as needed (if ondansetron (ZOFRAN) ineffective.). 07/03/13  Yes Penny Pia, MD  metoCLOPramide (REGLAN) 5 MG/ML injection Inject 1-2 mLs (5-10 mg total) into the vein every 8 (eight) hours as needed (if ondansetron (ZOFRAN) ineffective.). 07/03/13  Yes Penny Pia, MD  metoprolol (LOPRESSOR) 50 MG tablet Take 1 tablet (50 mg total) by mouth 2 (two) times daily. 07/03/13  Yes Penny Pia, MD  ondansetron (ZOFRAN) 8 MG tablet Take by mouth every  8 (eight) hours as needed for nausea. For upset stomach   Yes Historical Provider, MD  oxyCODONE (OXY IR/ROXICODONE) 5 MG immediate release tablet Take 1 tablet (5 mg total) by mouth every 4 (four) hours as needed. 07/03/13  Yes Penny Pia, MD  pantoprazole (PROTONIX) 40 MG tablet Take 1 tablet (40 mg total) by mouth daily. 07/03/13  Yes Penny Pia, MD  pregabalin (LYRICA) 75 MG capsule Take 75 mg by mouth 2 (two) times daily.   Yes Historical Provider, MD  simvastatin (ZOCOR) 40 MG tablet Take 40 mg by mouth every evening.   Yes Historical Provider, MD   Allergies  Allergen Reactions  . Amoxicillin     vomiting    FAMILY HISTORY:  Family History  Problem Relation Age of Onset  . Diabetes    . Pancreatic cancer Mother   . Diabetes Mother   . Hyperlipidemia Mother   . Breast cancer Sister   . Depression Maternal Grandmother   . Heart disease Maternal Grandmother   .  Depression Maternal Grandfather   . Heart disease Maternal Grandfather   . Depression Paternal Grandmother   . Heart disease Paternal Grandmother   . Depression Paternal Grandfather   . Heart disease Paternal Grandfather    SOCIAL HISTORY:  reports that he has never smoked. He has never used smokeless tobacco. He reports that he does not drink alcohol or use illicit drugs.  REVIEW OF SYSTEMS:   As per HPI - all other systems reviewed and were neg.   VITAL SIGNS: Temp:  [97.2 F (36.2 C)-97.5 F (36.4 C)] 97.5 F (36.4 C) (09/25 1439) Pulse Rate:  [84-87] 84 (09/25 1439) Resp:  [14-19] 14 (09/25 1439) BP: (121-140)/(83-92) 121/87 mmHg (09/25 1439) SpO2:  [98 %-100 %] 100 % (09/25 1439)  PHYSICAL EXAMINATION: General:  Chronically ill appearing male, NAD in bed  Neuro:  Awake, alert, appropriate, MAE HEENT:  Mm moist, no jvd, no la Cardiovascular:  s1s2 distant Lungs:  resps even mildly labored on [place], diminished bases, few scattered crackles bilat Abdomen:  Round, slightly distended, hypoactive  bs Musculoskeletal:  Warm and dry, new R BKA   Recent Labs Lab 07/11/13 1050  NA 127*  K 5.0  CL 93*  CO2 24  BUN 82*  CREATININE 1.36*  GLUCOSE 245*    Intake/Output Summary (Last 24 hours) at 07/11/13 1627 Last data filed at 07/11/13 1253  Gross per 24 hour  Intake   2675 ml  Output   2040 ml  Net    635 ml      Recent Labs Lab 07/11/13 1050  HGB 8.2*  HCT 25.1*  WBC 10.7*  PLT 448*   Dg Chest 2 View  07/11/2013   CLINICAL DATA:  Shortness breath. Cough. Diabetic and hypertensive patient.  EXAM: CHEST  2 VIEW  COMPARISON:  06/27/2013.  FINDINGS: Left PICC line tip distal superior vena cava level just above the cavoatrial junction.  Progressive diffuse asymmetric airspace disease with bilateral pleural effusions. Although pulmonary edema may explain this appearance, the patchy consolidation raises possibility of superimposed infection. Clinical correlation recommended. Followup until clearance recommended.  No gross pneumothorax.  Cardiomegaly.  IMPRESSION: Progressive diffuse asymmetric airspace disease with bilateral pleural effusions. Although pulmonary edema may explain this appearance, the patchy consolidation raises possibility of superimposed infection. Clinical correlation recommended.  Please see above.  These results will be called to the ordering clinician or representative by the Radiologist Assistant, and communication documented in the PACS Dashboard.   Electronically Signed   By: Bridgett Larsson   On: 07/11/2013 08:58    ASSESSMENT / PLAN:  Dyspnea - likely multifactorial in setting pulmonary edema, ?PNA, abdominal distension and severe deconditioning.   PLAN -  Repeat lasix  Will switch ancef to cefepime/vancomycin to cover ?HCAP Recommend ID to follow up  Pulmonary hygiene  Check BNP, cardiac enzymes  Cont pt/ot efforts  Urine lytes, osm (hyponatremic)   WHITEHEART,KATHRYN, NP 07/11/2013  4:29 PM Pager: [phone] or [phone]  PNA is a  possibility, abx recommendations as above, CXR ordered, BNP ordered, hyponatremia is another concern, will check urine lytes.  Recommend calling ID for the previous bacteremia.  Will f/u with you.  Pulmonary edema ?MI, troponins sent, if positive will need cardiology's involvement.  *Care during the described time interval was provided by me and/or other providers on the critical care team. I have reviewed this patient's available data, including medical history, events of note, physical examination and test results as part of my evaluation.  Rush Farmer, M.D. [facility] Pulmonary/Critical Care Medicine. Pager: [phone]. After hours pager: [phone].

## 2013-07-11 NOTE — Progress Notes (Addendum)
Patient ID: Albert [name], male   DOB: 1961/09/08, 52 y.o.   MRN: [id number] Subjective/Complaints: 52 y.o. 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 Admitted 06/25/2013 with nonhealing ulcer to the right foot and Charcot collapse right foot. Underwent irrigation and debridement of right foot plantar abscess 06/25/2013 per Dr. Magnus Ivan. Postoperatively with hypotension and tachycardia. Cardiology services consulted with findings of elevated troponin levels of 10. Echocardiogram with distal septal hypokinesis with overall low-normal LVEF. Elevated troponin levels are likely related to underlying sepsis. Underwent cardiac catheterization 06/28/2013 with findings of triple vessel CAD NSTEMI secondary to demand ischemia and mild LV systolic dysfunction. Patient placed on medical management with aspirin therapy. Patient with ongoing ischemic changes right lower extremity and underwent right below-knee amputation 06/29/2013 per Dr. Lajoyce Corners. Cardiothoracic surgery followup to consider CABG after recovery from BKA. TEE completed 07/03/2013 any vegetation that was negative with ejection fraction 45-50% .patient currently remains on Ancef therapy per infectious disease for sepsis /MSSA bacteremia with plan for 28 day duration  Pain under control Pt c/o SOB , O2 sats 100% RA  Review of Systems  Constitutional: Negative for fever and chills.  Gastrointestinal: Positive for constipation.  All other systems reviewed and are negative.    Objective: Vital Signs: Blood pressure 128/92, pulse 84, temperature 97.2 F (36.2 C), temperature source Oral, resp. rate 19, height 5' 10.87" (1.8 m), weight 131.679 kg (290 lb 4.8 oz), SpO2 100.00%. No results found. Results for orders placed during the hospital  encounter of 07/03/13 (from the past 72 hour(s))  GLUCOSE, CAPILLARY     Status: Abnormal   Collection Time    07/08/13 12:02 PM      Result Value Range   Glucose-Capillary 158 (*) 70 - 99 mg/dL   Comment 1 Notify RN    GLUCOSE, CAPILLARY     Status: Abnormal   Collection Time    07/08/13  4:35 PM      Result Value Range   Glucose-Capillary 101 (*) 70 - 99 mg/dL  GLUCOSE, CAPILLARY     Status: Abnormal   Collection Time    07/08/13  8:37 PM      Result Value Range   Glucose-Capillary 110 (*) 70 - 99 mg/dL  GLUCOSE, CAPILLARY     Status: Abnormal   Collection Time    07/09/13  7:24 AM      Result Value Range   Glucose-Capillary 179 (*) 70 - 99 mg/dL   Comment 1 Notify RN    GLUCOSE, CAPILLARY     Status: Abnormal   Collection Time    07/09/13 11:38 AM      Result Value Range   Glucose-Capillary 176 (*) 70 - 99 mg/dL   Comment 1 Notify RN    GLUCOSE, CAPILLARY     Status: Abnormal   Collection Time    07/09/13  4:30 PM      Result Value Range   Glucose-Capillary 183 (*) 70 - 99 mg/dL   Comment 1 Notify RN    GLUCOSE, CAPILLARY     Status: Abnormal   Collection Time    07/09/13  8:19 PM      Result Value Range   Glucose-Capillary 112 (*) 70 - 99 mg/dL  GLUCOSE, CAPILLARY     Status: Abnormal   Collection Time    07/10/13  7:34 AM  Result Value Range   Glucose-Capillary 143 (*) 70 - 99 mg/dL  GLUCOSE, CAPILLARY     Status: Abnormal   Collection Time    07/10/13 11:24 AM      Result Value Range   Glucose-Capillary 179 (*) 70 - 99 mg/dL  GLUCOSE, CAPILLARY     Status: Abnormal   Collection Time    07/10/13  4:34 PM      Result Value Range   Glucose-Capillary 119 (*) 70 - 99 mg/dL   Comment 1 Notify RN    GLUCOSE, CAPILLARY     Status: Abnormal   Collection Time    07/10/13  8:19 PM      Result Value Range   Glucose-Capillary 128 (*) 70 - 99 mg/dL  GLUCOSE, CAPILLARY     Status: Abnormal   Collection Time    07/11/13  7:38 AM      Result Value Range    Glucose-Capillary 157 (*) 70 - 99 mg/dL     HEENT: normal Cardio: RRR and murmur Resp: CTA B/L and unlabored, scattered wheezing GI: BS positive Extremity:  Pulses positive and No Edema Skin:   Breakdown Left distal MCP #3 , 2cm oval now closed and Wound C/D/I Neuro: Alert/Oriented, Cranial Nerve II-XII normal, Normal Sensory and Abnormal Motor 3-/5 ankle DF on left 4/5 bilateral hip flex, 5/5 B Delt,bi,tri,grip Musc/Skel:  Normal Gen: NAD   Assessment/Plan: 1. Functional deficits secondary to R BKA secondary to gangrene which require 3+ hours per day of interdisciplinary therapy in a comprehensive inpatient rehab setting. Physiatrist is providing close team supervision and 24 hour management of active medical problems listed below. Physiatrist and rehab team continue to assess barriers to discharge/monitor patient progress toward functional and medical goals.  FIM: FIM - Bathing Bathing Steps Patient Completed: Chest;Right Arm;Left Arm;Abdomen;Left upper leg;Front perineal area;Right upper leg;Buttocks Bathing: 5: Supervision: Safety issues/verbal cues  FIM - Upper Body Dressing/Undressing Upper body dressing/undressing steps patient completed: Thread/unthread right sleeve of pullover shirt/dresss;Thread/unthread left sleeve of pullover shirt/dress;Put head through opening of pull over shirt/dress;Pull shirt over trunk Upper body dressing/undressing: 5: Set-up assist to: Obtain clothing/put away FIM - Lower Body Dressing/Undressing Lower body dressing/undressing steps patient completed: Thread/unthread left pants leg;Thread/unthread right pants leg Lower body dressing/undressing: 3: Mod-Patient completed 50-74% of tasks  FIM - Toileting Toileting steps completed by patient:  (not wearing clothing) Toileting: 1: Total-Patient completed zero steps, helper did all 3  FIM - Diplomatic Services operational officer Devices: Psychiatrist Transfers: 4-To toilet/BSC: Min A  (steadying Pt. > 75%);3-From toilet/BSC: Mod A (lift or lower assist)  FIM - Bed/Chair Transfer Bed/Chair Transfer Assistive Devices: Arm rests Bed/Chair Transfer: 5: Sit > Supine: Supervision (verbal cues/safety issues);4: Bed > Chair or W/C: Min A (steadying Pt. > 75%);3: Chair or W/C > Bed: Mod A (lift or lower assist)  FIM - Locomotion: Wheelchair Distance: 80 Locomotion: Wheelchair: 1: Total Assistance/staff pushes wheelchair (Pt<25%) FIM - Locomotion: Ambulation Ambulation/Gait Assistance: Not tested (comment) Locomotion: Ambulation: 0: Activity did not occur  Comprehension Comprehension Mode: Auditory Comprehension: 7-Follows complex conversation/direction: With no assist  Expression Expression Mode: Verbal Expression: 7-Expresses complex ideas: With no assist  Social Interaction Social Interaction: 7-Interacts appropriately with others - No medications needed.  Problem Solving Problem Solving: 7-Solves complex problems: Recognizes & self-corrects  Memory Memory: 7-Complete Independence: No helper  Medical Problem List and Plan:  1. Right BKA due to a Charcot foot/abscess, prior left CVA with residual right-sided weakness  2. DVT  Prophylaxis/Anticoagulation: SCDs left lower extremity.  3. Pain Management: Lyrica 75 mg twice a day, Oxycodone , titrate the dose. Monitor with increased mobility  4. Neuropsych: This patient is capable of making decisions on his own behalf.  5. CAD/NSTEMI. Continue aspirin therapy. Plan followup cardiothoracic surgery 6-8 weeks to consider CABG. Has wheezing SOB but no CP, will check Xray, adjust inhaler6. ID. Sepsis/MSSA bacteremia. Continue Ancef as directed x28 days total initiated 06/25/2013.  7. Diabetes mellitus with peripheral neuropathy, uncontrolled. Hemoglobin A1c 8.2. Levemir 25  will titrate, NovoLog 3 times a day. Check blood sugars a.c. and at bedtime  8. Hypertension. Lopressor 50 mg twice a day. Monitor with increased mobility   9. Acute blood loss anemia. Latest hemoglobin 10.8. Followup CBC  10. Recent multi-toe amputation left foot. Continue wound care as advised. Monitor for increased ischemic changes. Need to be careful with transfers or any attempts with gait. Followup vascular surgery , appreciate WOC consult       11.  Peripheral edema ACE wrap, no diuretic due to hyponatremia    LOS (Days) 8 A FACE TO FACE EVALUATION WAS PERFORMED  Joshuwa Vecchio E 07/11/2013, 8:05 AM

## 2013-07-11 NOTE — Progress Notes (Signed)
Social Work Patient ID: Albert [name], male   DOB: [birth year], 52 y.o.   MRN: [id number] Met with pt yesterday to inform of team conference goals-mod/i wheelchair level and discharge 10/1.  Also left message for his wife to inform  Her of team conference.  Pt having difficulty with breathing today and MD aware.  Will await wife's return call.

## 2013-07-11 NOTE — Progress Notes (Signed)
Patient continues to report shortness of breath . o2 sat at 97% on 2liters o2 via nasal cannula . Patient reports feeling better decrease shortness of breath with occasional difficulty with mobility. Continue with plan of care .    Albert [name]

## 2013-07-12 ENCOUNTER — Inpatient Hospital Stay (HOSPITAL_COMMUNITY): Payer: 59

## 2013-07-12 ENCOUNTER — Inpatient Hospital Stay (HOSPITAL_COMMUNITY): Payer: 59 | Admitting: *Deleted

## 2013-07-12 ENCOUNTER — Encounter: Payer: Self-pay | Admitting: Surgery

## 2013-07-12 DIAGNOSIS — I70269 Atherosclerosis of native arteries of extremities with gangrene, unspecified extremity: Secondary | ICD-10-CM

## 2013-07-12 DIAGNOSIS — I5021 Acute systolic (congestive) heart failure: Secondary | ICD-10-CM | POA: Diagnosis not present

## 2013-07-12 DIAGNOSIS — E1165 Type 2 diabetes mellitus with hyperglycemia: Secondary | ICD-10-CM

## 2013-07-12 DIAGNOSIS — I2589 Other forms of chronic ischemic heart disease: Secondary | ICD-10-CM

## 2013-07-12 DIAGNOSIS — S88119A Complete traumatic amputation at level between knee and ankle, unspecified lower leg, initial encounter: Secondary | ICD-10-CM

## 2013-07-12 LAB — BASIC METABOLIC PANEL
BUN: 74 mg/dL — ABNORMAL HIGH (ref 6–23)
CO2: 25 mEq/L (ref 19–32)
Calcium: 8 mg/dL — ABNORMAL LOW (ref 8.4–10.5)
Chloride: 97 mEq/L (ref 96–112)
Creatinine, Ser: 1.28 mg/dL (ref 0.50–1.35)
GFR calc Af Amer: 74 mL/min — ABNORMAL LOW (ref >90)
GFR calc non Af Amer: 64 mL/min — ABNORMAL LOW (ref >90)
Glucose, Bld: 133 mg/dL — ABNORMAL HIGH (ref 70–99)

## 2013-07-12 LAB — URINE CULTURE
Colony Count: NO GROWTH
Culture: NO GROWTH

## 2013-07-12 LAB — GLUCOSE, CAPILLARY
Glucose-Capillary: 140 mg/dL — ABNORMAL HIGH (ref 70–99)
Glucose-Capillary: 109 mg/dL — ABNORMAL HIGH (ref 70–99)

## 2013-07-12 LAB — TROPONIN I: Troponin I: <0.3 ng/mL (ref <0.30)

## 2013-07-12 MED ORDER — ISOSORBIDE MONONITRATE ER 30 MG PO TB24
30.0000 mg | ORAL_TABLET | Freq: Every day | ORAL | Status: DC
  Administered 2013-07-12 – 2013-07-13 (×2): 30 mg via ORAL
  Filled 2013-07-12 (×4): qty 1

## 2013-07-12 MED ORDER — DEXTROSE 5 % IV SOLN
1.0000 g | Freq: Three times a day (TID) | INTRAVENOUS | Status: DC
  Administered 2013-07-12: 1 g via INTRAVENOUS
  Filled 2013-07-12 (×2): qty 1

## 2013-07-12 MED ORDER — CARVEDILOL 25 MG PO TABS
25.0000 mg | ORAL_TABLET | Freq: Two times a day (BID) | ORAL | Status: DC
  Administered 2013-07-12 – 2013-07-13 (×2): 25 mg via ORAL
  Filled 2013-07-12 (×6): qty 1

## 2013-07-12 MED ORDER — CEFEPIME HCL 1 G IJ SOLR
1.0000 g | Freq: Three times a day (TID) | INTRAMUSCULAR | Status: DC
  Administered 2013-07-12 – 2013-07-14 (×7): 1 g via INTRAVENOUS
  Filled 2013-07-12 (×12): qty 1

## 2013-07-12 MED ORDER — METOPROLOL TARTRATE 100 MG PO TABS
100.0000 mg | ORAL_TABLET | Freq: Two times a day (BID) | ORAL | Status: DC
  Filled 2013-07-12 (×2): qty 1

## 2013-07-12 MED ORDER — FUROSEMIDE 10 MG/ML IJ SOLN
40.0000 mg | Freq: Two times a day (BID) | INTRAMUSCULAR | Status: AC
  Administered 2013-07-12 (×2): 40 mg via INTRAVENOUS
  Filled 2013-07-12 (×2): qty 4

## 2013-07-12 NOTE — Plan of Care (Signed)
Problem: RH SKIN INTEGRITY Goal: RH STG ABLE TO PERFORM INCISION/WOUND CARE W/ASSISTANCE STG Able To Perform Incision/Wound Care With total Assistance.  Outcome: Progressing Dressing change by staff

## 2013-07-12 NOTE — Plan of Care (Signed)
Problem: RH Toileting Goal: LTG Patient will perform toileting w/assist, cues/equip (OT) LTG: Patient will perform toiletiing (clothes management/hygiene) with assist, with/without cues using equipment (OT)  Goals downgraded secondary to cardiopulmonary issues.

## 2013-07-12 NOTE — Progress Notes (Addendum)
ANTIBIOTIC CONSULT NOTE - FOLLOW UP  Pharmacy Consult for ceftazidime and vancomycin Indication: HCAP  Allergies  Allergen Reactions  . Amoxicillin     vomiting    Patient Measurements: Height: 5' 10.87" (180 cm) Weight: 290 lb 4.8 oz (131.679 kg) IBW/kg (Calculated) : 74.99 Adjusted Body Weight:   Vital Signs: Temp: 97.7 F (36.5 C) (09/26 0526) Temp src: Oral (09/26 0526) BP: 130/68 mmHg (09/26 0836) Pulse Rate: 72 (09/26 0836) Intake/Output from previous day: 09/25 0701 - 09/26 0700 In: 1270 [P.O.:720; IV Piggyback:550] Out: 2160 [Urine:2160] Intake/Output from this shift: Total I/O In: 120 [P.O.:120] Out: -   Labs:  Recent Labs  07/11/13 1050 07/12/13 0510  WBC 10.7*  --   HGB 8.2*  --   PLT 448*  --   CREATININE 1.36* 1.28   Estimated Creatinine Clearance: 95.4 ml/min (by C-G formula based on Cr of 1.28). No results found for this basename: VANCOTROUGH, Leodis Binet, VANCORANDOM, GENTTROUGH, GENTPEAK, GENTRANDOM, TOBRATROUGH, TOBRAPEAK, TOBRARND, AMIKACINPEAK, AMIKACINTROU, AMIKACIN,  in the last 72 hours   Microbiology: Recent Results (from the past 720 hour(s))  CULTURE, BLOOD (ROUTINE X 2)     Status: None   Collection Time    06/26/13  1:00 AM      Result Value Range Status   Specimen Description BLOOD RIGHT ARM   Final   Special Requests     Final   Value: BOTTLES DRAWN AEROBIC AND ANAEROBIC  ,   Culture  Setup Time     Final   Value: 06/26/2013 04:11     Performed at [facility]   Culture     Final   Value: STAPHYLOCOCCUS AUREUS     Note: SUSCEPTIBILITIES PERFORMED ON PREVIOUS CULTURE WITHIN THE LAST 5 DAYS.     Note: Gram Stain Report Called to,Read Back By and Verified With: STACY PHELPS 06/28/13 1155 BY SMITHERSJ     Performed at [facility]   Report Status 06/30/2013 FINAL   Final  CULTURE, BLOOD (ROUTINE X 2)     Status: None   Collection Time    06/26/13  1:08 AM      Result Value Range Status   Specimen  Description BLOOD RIGHT ARM   Final   Special Requests BOTTLES DRAWN AEROBIC AND ANAEROBIC    Final   Culture  Setup Time     Final   Value: 06/26/2013 04:10     Performed at [facility]   Culture     Final   Value: STAPHYLOCOCCUS AUREUS     Note: RIFAMPIN AND GENTAMICIN SHOULD NOT BE USED AS SINGLE DRUGS FOR TREATMENT OF STAPH INFECTIONS.     Note: Gram Stain Report Called to,Read Back By and Verified With: JOHN HUNT ON 06/28/2013 AT 8:21P BY Serafina Mitchell     Performed at [facility]   Report Status 06/30/2013 FINAL   Final   Organism ID, Bacteria STAPHYLOCOCCUS AUREUS   Final  MRSA PCR SCREENING     Status: None   Collection Time    06/26/13  8:08 AM      Result Value Range Status   MRSA by PCR NEGATIVE  NEGATIVE Final   Comment:            The GeneXpert MRSA Assay (FDA     approved for NASAL specimens     only), is one component of a     comprehensive MRSA colonization     surveillance program. It is not  intended to diagnose MRSA     infection nor to guide or     monitor treatment for     MRSA infections.  URINE CULTURE     Status: None   Collection Time    06/28/13  6:05 PM      Result Value Range Status   Specimen Description URINE, CATHETERIZED   Final   Special Requests Normal   Final   Culture  Setup Time     Final   Value: 06/28/2013 18:30     Performed at [facility] Count     Final   Value: NO GROWTH     Performed at [facility]   Culture     Final   Value: NO GROWTH     Performed at [facility]   Report Status 06/30/2013 FINAL   Final  CULTURE, BLOOD (ROUTINE X 2)     Status: None   Collection Time    06/29/13  3:10 PM      Result Value Range Status   Specimen Description BLOOD RIGHT ANTECUBITAL   Final   Special Requests BOTTLES DRAWN AEROBIC AND ANAEROBIC 10CC   Final   Culture  Setup Time     Final   Value: 06/29/2013 21:30     Performed at [facility]   Culture     Final   Value: NO  GROWTH 5 DAYS     Performed at [facility]   Report Status 07/05/2013 FINAL   Final  CULTURE, BLOOD (ROUTINE X 2)     Status: None   Collection Time    06/29/13  3:20 PM      Result Value Range Status   Specimen Description BLOOD RIGHT HAND   Final   Special Requests BOTTLES DRAWN AEROBIC AND ANAEROBIC 10CC   Final   Culture  Setup Time     Final   Value: 06/29/2013 21:31     Performed at [facility]   Culture     Final   Value: NO GROWTH 5 DAYS     Performed at [facility]   Report Status 07/05/2013 FINAL   Final    Anti-infectives   Start     Dose/Rate Route Frequency Ordered Stop   07/12/13 1000  cefTAZidime (FORTAZ) 1 g in dextrose 5 % 50 mL IVPB     1 g 100 mL/hr over 30 Minutes Intravenous Every 8 hours 07/12/13 0855     07/12/13 0800  vancomycin (VANCOCIN) 1,250 mg in sodium chloride 0.9 % 250 mL IVPB     1,250 mg 166.7 mL/hr over 90 Minutes Intravenous Every 12 hours 07/11/13 1741     07/11/13 1845  vancomycin (VANCOCIN) 2,000 mg in sodium chloride 0.9 % 500 mL IVPB     2,000 mg 250 mL/hr over 120 Minutes Intravenous  Once 07/11/13 1741 07/11/13 2249   07/11/13 1830  piperacillin-tazobactam (ZOSYN) IVPB 3.375 g  Status:  Discontinued     3.375 g 12.5 mL/hr over 240 Minutes Intravenous 3 times per day 07/11/13 1730 07/12/13 0855   07/11/13 1100  levofloxacin (LEVAQUIN) tablet 500 mg     500 mg Oral Daily 07/11/13 0939     07/03/13 2200  ceFAZolin (ANCEF) IVPB 2 g/50 mL premix  Status:  Discontinued     2 g 100 mL/hr over 30 Minutes Intravenous 3 times per day 07/03/13 1748 07/11/13 1631      Assessment: 52 y.o.  male w/ hx of uncontrolled DM w/ peripheral neuropathy, HTN, CVA initially admitted to [facility] 9/9 with non healing RLE ulcer. Ultimately underwent R BKA 9/13, cardiac cath 06/28/13 w/findings of triple vessel CAD NSTEMI secondary to demand ischemia and mild LV systolic dysfunction. Was on Ancef for MSSA bacteremia (plan for 28 days  therapy.  neg TEE). Patient was progressing well in [facility] inpt rehab unit until today 9/25/14when he developed acute onset SOB. PCCM consulted and has ordered switching from Ancef to IV vancomycin and ceftaz to cover ?HCAP. Zosyn was initially started incorrectly; team actually wants cefepime (not ceftaz). SCr 1.28 (~95). Afebrile; WBC 10.7 K on 09/25  Goal of Therapy:  Vancomycin trough level 15-20 mcg/ml  09/13 Ancef >> 09/25 09/25 Vanco >>  09/25 Levaquin >> 09/25 Zosyn >> 09/26 09/26 Ceftaz >> 09/26 09/26 Cefepime >>  09/12 ucx >> neg 09/13 blood cx x2 >> neg 09/25 ucx >> pending    Plan:  Change to cefepime 1g iv q8h Vancomycin 2 gm IV x1 loading dose then 1250 mg IV q12h. F/u on d/c'ing levaquin Monitor clinical status, renal function, order vancomycin steady state trough if needed (~Sat evening) F/u resuming home meds   Gay Moncivais, Tsz-Yin 07/12/2013,11:55 AM

## 2013-07-12 NOTE — Plan of Care (Signed)
Problem: RH Bed to Chair Transfers Goal: LTG Patient will perform bed/chair transfers w/assist (PT) LTG: Patient will perform bed/chair transfers with assistance, with/without cues (PT).  downgraded on 07/12/13, secondary to evolving medical issues  Problem: RH Car Transfers Goal: LTG Patient will perform car transfers with assist (PT) LTG: Patient will perform car transfers with assistance (PT).  downgraded on 07/12/13, secondary to evolving medical issues

## 2013-07-12 NOTE — Plan of Care (Signed)
Problem: RH Dressing Goal: LTG Patient will perform lower body dressing w/assist (OT) LTG: Patient will perform lower body dressing with assist, with/without cues in positioning using equipment (OT)  Goals downgraded secondary to cardiopulmonary issues.

## 2013-07-12 NOTE — Plan of Care (Signed)
Problem: RH Toilet Transfers Goal: LTG Patient will perform toilet transfers w/assist (OT) LTG: Patient will perform toilet transfers with assist, with/without cues using equipment (OT)  Goals downgraded secondary to cardiopulmary issues.

## 2013-07-12 NOTE — Progress Notes (Signed)
PULMONARY  / CRITICAL CARE MEDICINE  Name: Albert [name] MRN: [id number] DOB: 21-Jan-1961    ADMISSION DATE:  07/03/2013 CONSULTATION DATE:  07/11/13  REFERRING MD :  Wynn Banker  PRIMARY SERVICE:  Rehab  CHIEF COMPLAINT:  SOB  BRIEF PATIENT DESCRIPTION: 52 y.o. male w/ hx of uncontrolled DM w/ peripheral neuropathy, HTN, CVA initially admitted to [facility] 9/9 with non healing RLE ulcer. Ultimately underwent R BKA 9/13. Post op with hypotension and tachycardia. Underwent cardiac catheterization 06/28/13 w/ findings of triple vessel CAD NSTEMI secondary to demand ischemia and mild LV systolic dysfunction.  Course also c/b MSSA bacteremia (neg TEE) for which he is still receiving 28 total days Ancef. He was d/c to [facility] inpt rehab where he was progressing well when he developed acute onset SOB 9/25 and PCCM consulted.   SIGNIFICANT EVENTS / STUDIES:  9/17 TEE>> EF 45-50%, mod MR, septal hypokinesis   LINES / TUBES: LUE PICC >>>  CULTURES: Urine 9/25>>>  ANTIBIOTICS: Ancef (MSSA bacteremia)  9/17>>> (28 days total)  HISTORY OF PRESENT ILLNESS:  52 y.o. male w/ hx of uncontrolled DM w/ peripheral neuropathy, HTN, CVA initially admitted to [facility] 9/9 with non healing RLE ulcer. Ultimately underwent R BKA 9/13. Post op with hypotension and tachycardia. Underwent cardiac catheterization 06/28/13 w/ findings of triple vessel CAD NSTEMI secondary to demand ischemia and mild LV systolic dysfunction.  Course also c/b MSSA bacteremia (neg TEE) for which he is still receiving 28 total days Ancef. He was d/c to [facility] inpt rehab where he was progressing well when he developed acute onset SOB 9/25 and PCCM consulted.   SUBJECTIVE: I feel better today but still short of breath  VITAL SIGNS: Temp:  [97.4 F (36.3 C)-97.7 F (36.5 C)] 97.7 F (36.5 C) (09/26 0526) Pulse Rate:  [72-84] 72 (09/26 0836) Resp:  [14-19] 19 (09/26 0526) BP: (121-130)/(68-91) 130/68 mmHg (09/26 0836) SpO2:  [98 %-100 %]  99 % (09/26 0933) On RA PHYSICAL EXAMINATION: General:  Chronically ill appearing male, seen in hallway in wheelchair Neuro:  Awake, alert, appropriate, MAE HEENT:  Mm moist, no jvd, no la Cardiovascular:  s1s2 distant Lungs:  resps even mildly labored on [place], diminished bases, few scattered crackles bilat Abdomen:  Round, slightly distended, hypoactive bs Musculoskeletal:  Warm and dry, new R BKA   Recent Labs Lab 07/11/13 1050 07/12/13 0510  NA 127* 130*  K 5.0 5.0  CL 93* 97  CO2 24 25  BUN 82* 74*  CREATININE 1.36* 1.28  GLUCOSE 245* 133*    Intake/Output Summary (Last 24 hours) at 07/12/13 1025 Last data filed at 07/12/13 0813  Gross per 24 hour  Intake   1030 ml  Output   2160 ml  Net  -1130 ml      Recent Labs Lab 07/11/13 1050  HGB 8.2*  HCT 25.1*  WBC 10.7*  PLT 448*   Dg Chest 2 View  07/12/2013   CLINICAL DATA:  Airspace opacities in the lungs and pleural effusions.  EXAM: CHEST  2 VIEW  COMPARISON:  07/11/2013  FINDINGS: Left central line tip projects over the lower SVC.  Mildly increased airspace opacity in the right middle lobe. Otherwise the bilateral perihilar airspace opacities appear stable.  Cardiothoracic index 57%. Moderate pleural effusions on the lateral projection.  IMPRESSION: 1. Moderate bilateral pleural effusions. Perihilar airspace opacities with mild cardiomegaly favoring pulmonary edema over atypical pneumonia.   Electronically Signed   By: Herbie Baltimore  On: 07/12/2013 07:43   Dg Chest 2 View  07/11/2013   CLINICAL DATA:  Shortness breath. Cough. Diabetic and hypertensive patient.  EXAM: CHEST  2 VIEW  COMPARISON:  06/27/2013.  FINDINGS: Left PICC line tip distal superior vena cava level just above the cavoatrial junction.  Progressive diffuse asymmetric airspace disease with bilateral pleural effusions. Although pulmonary edema may explain this appearance, the patchy consolidation raises possibility of superimposed infection. Clinical  correlation recommended. Followup until clearance recommended.  No gross pneumothorax.  Cardiomegaly.  IMPRESSION: Progressive diffuse asymmetric airspace disease with bilateral pleural effusions. Although pulmonary edema may explain this appearance, the patchy consolidation raises possibility of superimposed infection. Clinical correlation recommended.  Please see above.  These results will be called to the ordering clinician or representative by the Radiologist Assistant, and communication documented in the PACS Dashboard.   Electronically Signed   By: Bridgett Larsson   On: 07/11/2013 08:58    ASSESSMENT / PLAN:  Dyspnea - likely multifactorial in setting pulmonary edema, Doubt PNA.  BNP>>20K,  Hx of 3VD ?CABG later.  Overt acute systolic heart failure. Hyponatremia is due to heart failure and reduction of Na to distal tubule.  PLAN -  Give more lasix Cont cefepime/vancomycin to cover ?HCAP Recommend ID to follow up  Pulmonary hygiene  Ask Cardiology to see the pt again Cont pt/ot efforts    Caryl Bis  [phone]  Cell  [phone]  If no response or cell goes to voicemail, call beeper [phone]  07/12/2013  10:25 AM

## 2013-07-12 NOTE — Progress Notes (Signed)
I have reviewed and agree with the treatment as reflected in this note. Hillis Mcphatter, PT DPT  

## 2013-07-12 NOTE — Plan of Care (Signed)
Problem: RH Bathing Goal: LTG Patient will bathe with assist, cues/equipment (OT) LTG: Patient will bathe specified number of body parts with assist with/without cues using equipment (position) (OT)  Goals downgraded secondary to cardiopulmonary issues.

## 2013-07-12 NOTE — Progress Notes (Signed)
Occupational Therapy Weekly Progress Note  Patient Details  Name: Albert [name] MRN: [id number] Date of Birth: 1961-07-26  Today's Date: 07/12/2013 Time: 1000-1050 and 1400-1430 Time Calculation (min): 50 min and 30 min   Patient has met 2 of 4 short term goals.  Patient did not meet 2 goals secondary to decreasing focus on them. Patient completing ADLs while sitting EOB and using lateral leaning technique for clothing management around waist rather then standing to increase independence. Patient is very motivated during therapy session. Patient is progressing well requiring min assist for squat pivot transfers going to left side and mod-max when transferring to right side. Patient is limited by cardiopulmonary issues and requires frequent rest breaks secondary to SOB. Patient's exertion during therapy sessions being modified beginning 07/11/13 after patient had difficulty breathing throughout day and pulmonary/critical care assessed patient. LTG's have been downgraded to min assist due to change in medical status.   Patient continues to demonstrate the following deficits: decreased strength, decreased endurance, decreased awareness, decreased standing balance, decreased sitting balance, decreased coordination and therefore will continue to benefit from skilled OT intervention to enhance overall performance with BADL.  Patient not progressing toward long term goals.  See goal revision..  Continue plan of care.  OT Short Term Goals Week 1:  OT Short Term Goal 1 (Week 1): Pt will complete LB dressing with mod assist  OT Short Term Goal 1 - Progress (Week 1): Met OT Short Term Goal 2 (Week 1): Pt will complete toilet transfer to drop arm BSC with mod assist  OT Short Term Goal 2 - Progress (Week 1): Met OT Short Term Goal 3 (Week 1): Pt will tolerate standing during 1 ADL with min assist for balance OT Short Term Goal 3 - Progress (Week 1): Discontinued (comment) (completing ADLs edge of bed using  lateral lean) OT Short Term Goal 4 (Week 1): Pt will complete bathing task with min assist standing balance OT Short Term Goal 4 - Progress (Week 1): Discontinued (comment) (completing bathing using lateral leans while sitting eob) Week 2:  OT Short Term Goal 1 (Week 2): Focus on LTGs  Skilled Therapeutic Interventions/Progress Updates:    Session 1: Pt received on 2L O2 and nursing informed OT that pt could remain off O2 if no sob. Therapy session focused on strengthening, activity tolerance, and self-care tasks. Pt declining bathing and dressing however requested to complete grooming tasks. Pt reported feeling "much better" today. Pt received sitting in w/c and propelled self to sink and positioned self to complete oral care with increased time and min cues. Pt propelled self approx 20 feet with increased time on this date and OT assisted with remainder of way. Engaged in arm bike with min resistance and slow pace for arm strengthening and light endurance. Returned to room and discussed edema control and management. Completed retrograde massage and elevated positioning with LUE. Pt discussing home setup for d/c with therapist and able to correctly problem solve on appropriate places to keep 3-in-1 commode. Educated on energy conservation using teach back and pt reports aware of "taking it easy." Pt left sitting in w/c per his request with all items in reach. No sob noted during therapy session.  Session 2: Pt received supine in bed with no O2 and reported feeling "good". Pt completed supine>sit with min assist and took rest break before completing squat pivot transfer to left with min assist. Pt transferred to gym via w/c with total assist. Engagedin UE strengthening exercises using  2-3 lb weights for LUE and 1 lb weight ace wrapped to hand for RUE. Completed chest press, shoulder flexion, bicep, and tricep exercises 10 reps with LUE and 5 reps with RUE. Pt with no sob during exercises and took rest break  between each set. Completed 2 sets of each exercise. Pt left in therapy gym with tech for PT.   Therapy Documentation Precautions:  Precautions Precautions: Fall Precaution Comments: watch HR Restrictions Weight Bearing Restrictions: No RLE Weight Bearing: Non weight bearing (Right BKA) Other Position/Activity Restrictions: elevate operated limb, do not prop pillows under knee; try to position on LEFT side so that RIGHT hip can be in neutral/extension General: General Amount of Missed OT Time (min): 10 Minutes Vital Signs: Therapy Vitals Pulse Rate: 72 BP: 130/68 mmHg Oxygen Therapy SpO2: 99 % O2 Device: Nasal cannula O2 Flow Rate (L/min): 2 L/min Pain: Pain Assessment Pain Assessment: 0-10 Pain Score: 5  Pain Type: Surgical pain;Phantom pain Pain Location: Leg Pain Orientation: Right Pain Descriptors / Indicators: Aching;Discomfort Pain Frequency: Occasional Pain Onset: Gradual Patients Stated Pain Goal: 2 Pain Intervention(s): Medication (See eMAR) (oxycodone 15mg  po) ADL:   Exercises:   Other Treatments:    See FIM for current functional status  Therapy/Group: Individual Therapy  Marlina Cataldi N 07/12/2013, 11:00 AM

## 2013-07-12 NOTE — Consult Note (Signed)
CARDIOLOGY CONSULT NOTE    Patient ID: Albert [name] MRN: [id number] DOB/AGE: 1961/07/13 52 y.o.  Admit date: 07/03/2013 Referring Physician Claudette Laws MD Primary Physician Lia Hopping MD Primary Cardiologist Earney Hamburg MD Reason for Consultation CHF  HPI: Albert [name] is a 52 yo WM known to our service. We are asked to see him at the request of the Rehab service for CHF. He was admitted from September 9 until 07/03/2013. He has a history of poorly controlled diabetes and presented with SIRS and MSSA bacteremia from a gangrenous right foot. This was subsequently debrided and he underwent a right BKA on 06/29/2013. During his acute illness he became hypotensive and had elevated troponin levels. He underwent cardiac catheterization which demonstrated severe three-vessel coronary disease. LV function was moderately reduced. Ejection fraction is estimated at 40%. TEE revealed moderate mitral insufficiency. He was seen by CT surgery with plans to consider coronary bypass surgery once he has recovered fully from his recent infection. The patient was transferred to the rehabilitation service. The patient noted symptoms of increased shortness of breath over the past 2-3 days. Yesterday morning when he awoke he states he couldn't get his breath. Weight has increased 10 pounds over the past week. I/Os are fairly well matched. Patient denies any chest pain or cough. He's had no fever. He has noticed significant edema of his lower extremities. He received IV Lasix and notes a significant improvement in his shortness of breath today. His chest x-ray shows improvement in his dyspnea. BNP level was over 20,000.  Review of systems complete and found to be negative unless listed above   Past Medical History  Diagnosis Date  . Hypertension   . Diabetes mellitus without complication ~2000    last HbA1c ~9  . Hypercholesteremia   . CVA (cerebral infarction) 2011    R hand deficit  . Chronic sinusitis     . Allergic rhinitis   . Chronic cough   . Complication of anesthesia     couldn't swallow and talk  . Stroke     Family History  Problem Relation Age of Onset  . Diabetes    . Pancreatic cancer Mother   . Diabetes Mother   . Hyperlipidemia Mother   . Breast cancer Sister   . Depression Maternal Grandmother   . Heart disease Maternal Grandmother   . Depression Maternal Grandfather   . Heart disease Maternal Grandfather   . Depression Paternal Grandmother   . Heart disease Paternal Grandmother   . Depression Paternal Grandfather   . Heart disease Paternal Grandfather     History   Social History  . Marital Status: Married    Spouse Name: [name]    Number of Children: [name]  . Years of Education: [name]   Occupational History  . Not on file.   Social History Main Topics  . Smoking status: Never Smoker   . Smokeless tobacco: Never Used  . Alcohol Use: No  . Drug Use: No  . Sexual Activity: No   Other Topics Concern  . Not on file   Social History Narrative   Lives in [place]   Wife: [name]    Past Surgical History  Procedure Laterality Date  . 4th toe amputation Left Jan. 2014    4th toe in [place]  . Tonsillectomy and adenoidectomy    . Penile prosthesis implant    . I&d extremity Left 12/08/2012    Procedure: IRRIGATION AND DEBRIDEMENT EXTREMITY;  Surgeon: Kathryne Hitch, MD;  Location: [facility] OR;  Service: Orthopedics;  Laterality: Left;  . Amputation Left 12/08/2012    Procedure: AMPUTATION DIGIT;  Surgeon: Kathryne Hitch, MD;  Location: [facility] OR;  Service: Orthopedics;  Laterality: Left;  3rd toe amputation possible 5th toe amputation  . I&d extremity Left 12/11/2012    Procedure: REPEAT I&D LEFT FOOT;  Surgeon: Kathryne Hitch, MD;  Location: [facility] OR;  Service: Orthopedics;  Laterality: Left;  . Amputation Left 12/11/2012    Procedure: Amputation of 2nd Toe;  Surgeon: Kathryne Hitch, MD;  Location: [facility] OR;  Service: Orthopedics;  Laterality: Left;   . Bypass graft popliteal to tibial Left 12/13/2012    Procedure: BYPASS GRAFT POPLITEAL TO TIBIAL;  Surgeon: Nada Libman, MD;  Location: [facility] OR;  Service: Vascular;  Laterality: Left;  Left Popliteal to Posterior Tibial Bypass Graft with reversed saphenous vein graft  . Incision and drainage Right 06/25/2013    Procedure: INCISION AND DRAINAGE RIGHT FOOT;  Surgeon: Kathryne Hitch, MD;  Location: [facility] ORS;  Service: Orthopedics;  Laterality: Right;  . Amputation Right 06/29/2013    Procedure: AMPUTATION BELOW KNEE;  Surgeon: Nadara Mustard, MD;  Location: [facility] OR;  Service: Orthopedics;  Laterality: Right;  . Tee without cardioversion [name] 07/03/2013    Procedure: TRANSESOPHAGEAL ECHOCARDIOGRAM (TEE);  Surgeon: Laurey Morale, MD;  Location: [facility] ENDOSCOPY;  Service: Cardiovascular;  Laterality: [name];     Prescriptions prior to admission  Medication Sig Dispense Refill  . aspirin 81 MG tablet Take 81 mg by mouth daily.      Marland Kitchen ceFAZolin (ANCEF) 2-3 GM-% SOLR Inject 50 mLs (2 g total) into the vein every 8 (eight) hours.  50 mL  0  . docusate sodium 100 MG CAPS Take 100 mg by mouth 2 (two) times daily.  10 capsule  0  . fentaNYL (SUBLIMAZE) 0.05 MG/ML injection Inject 1-2 mLs (50-100 mcg total) into the vein every 2 (two) hours as needed for severe pain (or RASS > 0).  2 mL  0  . glyBURIDE-metformin (GLUCOVANCE) 5-500 MG per tablet Take 2 tablets by mouth 2 (two) times daily.      . insulin aspart (NOVOLOG FLEXPEN) 100 UNIT/ML injection Inject 12 Units into the skin 3 (three) times daily. Take every time he eats per patient      . insulin detemir (LEVEMIR) 100 UNIT/ML injection Inject 0.25 mLs (25 Units total) into the skin every evening.  10 mL  0  . lisinopril (PRINIVIL,ZESTRIL) 5 MG tablet Take 1 tablet (5 mg total) by mouth daily.      . metoCLOPramide (REGLAN) 5 MG tablet Take 1-2 tablets (5-10 mg total) by mouth every 8 (eight) hours as needed (if ondansetron (ZOFRAN) ineffective.).      Marland Kitchen  metoCLOPramide (REGLAN) 5 MG/ML injection Inject 1-2 mLs (5-10 mg total) into the vein every 8 (eight) hours as needed (if ondansetron (ZOFRAN) ineffective.).  2 mL  0  . metoprolol (LOPRESSOR) 50 MG tablet Take 1 tablet (50 mg total) by mouth 2 (two) times daily.      . ondansetron (ZOFRAN) 8 MG tablet Take by mouth every 8 (eight) hours as needed for nausea. For upset stomach      . oxyCODONE (OXY IR/ROXICODONE) 5 MG immediate release tablet Take 1 tablet (5 mg total) by mouth every 4 (four) hours as needed.  30 tablet  0  . pantoprazole (PROTONIX) 40 MG tablet Take 1 tablet (40 mg total) by mouth daily.      Marland Kitchen  pregabalin (LYRICA) 75 MG capsule Take 75 mg by mouth 2 (two) times daily.      . simvastatin (ZOCOR) 40 MG tablet Take 40 mg by mouth every evening.        Physical Exam: Blood pressure 130/68, pulse 72, temperature 97.6 F (36.4 C), temperature source Oral, resp. rate 19, height 5' 10.87" (1.8 m), weight 131.679 kg (290 lb 4.8 oz), SpO2 99.00%. Patient is a chronically ill-appearing, obese white male in no acute distress. HEENT: Normocephalic, atraumatic. Pupils are equal round and reactive. Sclera are clear. His oropharynx is clear. Neck: Thick. It is difficult to assess his jugular venous pulse. No bruits. No adenopathy or thyromegaly. Lungs: Decreased breath sounds in both bases. Few scattered crackles. Cardiovascular: Regular rate and rhythm. Normal S1 and S2. No gallop, murmur, or click. Abdomen: Obese, soft, nontender. Bowel sounds are positive. No tenderness or masses. Extremities. Status post right BKA. His left foot is wrapped in bandages. He has 3+ edema to his upper thighs. Neurologic: He is alert and oriented x3. Cranial nerves II through XII are intact.   Labs:   Lab Results  Component Value Date   WBC 10.7* 07/11/2013   HGB 8.2* 07/11/2013   HCT 25.1* 07/11/2013   MCV 80.4 07/11/2013   PLT 448* 07/11/2013    Recent Labs Lab 07/12/13 0510  NA 130*  K 5.0  CL 97    CO2 25  BUN 74*  CREATININE 1.28  CALCIUM 8.0*  GLUCOSE 133*   Lab Results  Component Value Date   TROPONINI <0.30 07/12/2013    Lab Results  Component Value Date   CHOL 91 12/08/2012   Lab Results  Component Value Date   HDL 22* 12/08/2012   Lab Results  Component Value Date   LDLCALC 54 12/08/2012   Lab Results  Component Value Date   TRIG 73 12/08/2012   Lab Results  Component Value Date   CHOLHDL 4.1 12/08/2012   No results found for this basename: LDLDIRECT      Radiology: CXR 07/12/13 CHEST 2 VIEW  COMPARISON: 07/11/2013  FINDINGS:  Left central line tip projects over the lower SVC.  Mildly increased airspace opacity in the right middle lobe.  Otherwise the bilateral perihilar airspace opacities appear stable.  Cardiothoracic index 57%. Moderate pleural effusions on the lateral  projection.  IMPRESSION:  1. Moderate bilateral pleural effusions. Perihilar airspace  opacities with mild cardiomegaly favoring pulmonary edema over  atypical pneumonia.  Electronically Signed  By: Herbie Baltimore  EKG: 06/26/13 sinus tachycardia, low voltage, possible inferior infarct age undetermined.  Transthoracic Echocardiography  Patient: Marquis, Down MR #: [id number] Study Date: 06/26/2013 Gender: M Age: 55 Height: 180.3cm Weight: 115.9kg BSA: 2.61m^2 Pt. Status: Room: 1235  PERFORMING [place], [facility] ORDERING [place], [facility] [place], [place] ATTENDING Rory Percy SONOGRAPHER Jeryl Columbia cc:  ------------------------------------------------------------  ------------------------------------------------------------ History: PMH: Former Smoker, Dehydration, Sepsis, Elevated Troponin Stroke. Risk factors: Hypertension. Diabetes mellitus.  ------------------------------------------------------------ Study Conclusions  - Left ventricle: Poor endocardial definition Distal septal hypokinesis EF low normal or mildly decreased The  cavity size was normal. Wall thickness was normal. - Atrial septum: No defect or patent foramen ovale was identified. - Pulmonary arteries: PA peak pressure: 42mm Hg (S). - Impressions: No obvious vegetations Impressions:  - No obvious vegetations Transthoracic echocardiography. M-mode, complete 2D, spectral Doppler, and color Doppler. Height: Height: 180.3cm. Height: 71in. Weight: Weight: 115.9kg. Weight: 255lb. Body mass index: BMI: 35.6kg/m^2. Body surface area: BSA: 2.38m^2. Blood pressure: 95/86. Patient  status: Inpatient. Location: Bedside.  ------------------------------------------------------------  ------------------------------------------------------------ Left ventricle: Poor endocardial definition Distal septal hypokinesis EF low normal or mildly decreased The cavity size was normal. Wall thickness was normal.  ------------------------------------------------------------ Aortic valve: Mildly thickened leaflets.  ------------------------------------------------------------ Aorta: The aorta was poorly visualized.  ------------------------------------------------------------ Mitral valve: Mildly thickened leaflets .  ------------------------------------------------------------ Left atrium: The atrium was normal in size.  ------------------------------------------------------------ Atrial septum: No defect or patent foramen ovale was identified.  ------------------------------------------------------------ Right ventricle: The cavity size was normal. Wall thickness was normal. Systolic function was normal.  ------------------------------------------------------------ Pulmonic valve: Poorly visualized.  ------------------------------------------------------------ Tricuspid valve: Doppler: Mild regurgitation.  ------------------------------------------------------------ Right atrium: The atrium was normal in  size.  ------------------------------------------------------------ Pericardium: The pericardium was normal in appearance.  ------------------------------------------------------------ Post procedure conclusions Ascending Aorta:  - The aorta was poorly visualized.  ------------------------------------------------------------  2D measurements Normal Doppler measurements Normal Left ventricle Main pulmonary LVID ED, 43.7 mm 43-52 artery chord, Pressure, S 42 mm =30 PLAX Hg LVID ES, 38.2 mm 23-38 Tricuspid valve chord, Regurg peak 262 cm/s ------ PLAX vel FS, chord, 13 % >29 Peak RV-RA 27 mm ------ PLAX gradient, S Hg LVPW, ED 11.1 mm ------ Max regurg 262 cm/s ------ IVS/LVPW 0.67 <1.3 vel ratio, ED Systemic veins Ventricular septum Estimated 15 mm ------ IVS, ED 7.47 mm ------ CVP Hg Aorta Right ventricle Root diam, 30 mm ------ Pressure, S 42 mm <30 ED Hg Left atrium AP dim 38 mm ------ AP dim 1.55 cm/m^2 <2.2 index  ------------------------------------------------------------ Prepared and Electronically Authenticated by  Charlton Haws 2014-09-10T17:27:[id number]   Transesophageal Echocardiography  Patient: Benjamyn, Hestand MR #: [id number] Study Date: 07/03/2013 Gender: M Age: 4 Height: 180.3cm Weight: 121.8kg BSA: 2.52m^2 Pt. Status: Room: 5W15C  SONOGRAPHER Perley Jain, RDCS ADMITTING [place], Anand PERFORMING Shirlee Latch, [place] ATTENDING Juleen China, Bernadette Hoit, Janelle Floor cc:  ------------------------------------------------------------ LV EF: 45% - 50%  ------------------------------------------------------------ Indications: Bacteremia 790.7.  ------------------------------------------------------------ Study Conclusions  - Left ventricle: The cavity size was normal. Wall thickness was normal. Systolic function was mildly reduced. The estimated ejection fraction was in the range of 45% to 50%.  Septal hypokinesis. - Aortic valve: No evidence of vegetation. There was no stenosis. - Aorta: Normal caliber aorta with grade III plaque in the arch and descending thoracic aorta. - Mitral valve: No evidence of vegetation. Moderate regurgitation. Effective regurgitant orifice: 0.22cm^2 (PISA). - Left atrium: The atrium was mildly dilated. No evidence of thrombus in the atrial cavity or appendage. - Pulmonary veins: No systolic flow reversal in pulmonary vein doppler pattern. - Right ventricle: The cavity size was normal. Systolic function was normal. - Right atrium: Catheter tip in RA, no vegetation. No evidence of thrombus in the atrial cavity or appendage. - Atrial septum: No defect or patent foramen ovale was identified. Echo contrast study showed no right-to-left atrial level shunt, at baseline or with provocation. - Tricuspid valve: No evidence of vegetation. - Pulmonic valve: No evidence of vegetation. - Pericardium, extracardiac: A trivial pericardial effusion was identified. Impressions:  - No evidence of endocarditis. Transesophageal echocardiography. 2D and color Doppler. Height: Height: 180.3cm. Height: 71in. Weight: Weight: 121.8kg. Weight: 268lb. Body mass index: BMI: 37.5kg/m^2. Body surface area: BSA: 2.42m^2. Blood pressure: 100/77. Patient status: Inpatient. Location: Endoscopy.  ------------------------------------------------------------  ------------------------------------------------------------ Left ventricle: The cavity size was normal. Wall thickness was normal. Systolic function was mildly reduced. The estimated ejection fraction was in the range of 45% to 50%. Septal hypokinesis.  ------------------------------------------------------------ Aortic valve: Trileaflet; mildly calcified leaflets. No evidence  of vegetation. Doppler: There was no stenosis. No regurgitation.  ------------------------------------------------------------ Aorta: Normal  caliber aorta with grade III plaque in the arch and descending thoracic aorta.  ------------------------------------------------------------ Mitral valve: No evidence of vegetation. Doppler: There was no evidence for stenosis. Moderate regurgitation.  ------------------------------------------------------------ Left atrium: The atrium was mildly dilated. No evidence of thrombus in the atrial cavity or appendage.  ------------------------------------------------------------ Atrial septum: No defect or patent foramen ovale was identified. Echo contrast study showed no right-to-left atrial level shunt, at baseline or with provocation.  ------------------------------------------------------------ Pulmonary veins: No systolic flow reversal in pulmonary vein doppler pattern.  ------------------------------------------------------------ Right ventricle: The cavity size was normal. Systolic function was normal.  ------------------------------------------------------------ Pulmonic valve: Structurally normal valve. Cusp separation was normal. No evidence of vegetation.  ------------------------------------------------------------ Tricuspid valve: No evidence of vegetation. Doppler: Trivial regurgitation.  ------------------------------------------------------------ Right atrium: Catheter tip in RA, no vegetation. The atrium was normal in size. No evidence of thrombus in the atrial cavity or appendage.  ------------------------------------------------------------ Pericardium: A trivial pericardial effusion was identified.  ------------------------------------------------------------ Post procedure conclusions Ascending Aorta:  - Normal caliber aorta with grade III plaque in the arch and descending thoracic aorta.  ------------------------------------------------------------  Doppler measurements Normal Mitral valve Regurg 37.1 cm/s ------ alias vel, PISA Max regurg 512  cm/s ------ vel Regurg VTI 136 cm ------ ERO, PISA 0.22 cm^2 ------ Regurg 30 ml ------ vol, PISA  ------------------------------------------------------------ Prepared and Electronically Authenticated by  Marca Ancona 2014-09-17T16:34:[id number]  Cardiac Catheterization Operative Report  CRISTOFHER LIVECCHI  [id number]  9/12/20142:47 PM  Lia Hopping A, MD  Procedure Performed:  1. Left Heart Catheterization 2. Selective Coronary Angiography 3. Left ventricular angiogram Operator: Verne Carrow, MD  Indication: 52 yo WM with uncontrolled DM and HTN, severe PAD admitted with septic shock secondary to necrotic right foot. No chest pain but troponin elevated in setting of profound hypotension. Plans for BKA on right but cardiac cath to exclude CAD before BKA.  Procedure Details:  The risks, benefits, complications, treatment options, and expected outcomes were discussed with the patient. The patient and/or family concurred with the proposed plan, giving informed consent. The patient was brought to the cath lab after IV hydration was begun and oral premedication was given. The patient was further sedated with Versed and Fentanyl. The left groin was prepped and draped in the usual manner. Using the modified Seldinger access technique, a 5 French sheath was placed in the left femoral artery. Standard diagnostic catheters were used to perform selective coronary angiography. A pigtail catheter was used to perform a left ventricular angiogram.  There were no immediate complications. The patient was taken to the recovery area in stable condition.  Hemodynamic Findings:  Central aortic pressure: 90/61  Left ventricular pressure: 90/19/29  Angiographic Findings:  Left main: Short segment without obstruction.  Left Anterior Descending Artery: Moderate caliber diffusely diseased vessel that courses to the apex. The proximal vessel has diffuse 40% stenosis. The mid vessel has a 70% stenosis  followed by a 60% stenosis. The distal vessel has diffuse 90% stenosis. The diagonal is a moderate caliber vessel with ostial 99% stenosis with 99% disease extending into the mid vessel.  Circumflex Artery: Large caliber vessel with proximal 80-90% stenosis. There are three small to moderate caliber obtuse marginal branches. The first OM has mid 80% stenosis. The second OM has a proximal 99% stenosis. The third OM has diffuse 99% stenosis. The left sided posterolateral branch has 99% stenosis.  Right Coronary Artery: Small non-dominant vessel with mid 99% stenosis.  Left Ventricular Angiogram: LVEF=40%  Impression:  1. Triple vessel CAD  2. NSTEMI secondary to demand ischemia from sepsis with severe underlying CAD  3. Mild LV systolic dysfunction  Recommendations: Complex situation. The only option for coronary revascularization is CABG. He is not a favorable candidate for CABG at this time with necrotic right foot, sepsis. Even though not ideal, would favor medical management of CAD while undergoing treatment of his necrotic foot. Would proceed with right BKA this weekend. I have discussed the case with CT surgery who agrees that CABG is not a good option at this time. Will ask CT surgery to see after he recovers from his BKA. Continue ASA, beta blocker, statin.  Complications: None. The patient tolerated the procedure well.   ASSESSMENT AND PLAN:  1. Acute systolic CHF. Patient is significantly volume overloaded with marked lower extremity edema, weight gain > 10 lbs, Elevated BNP and pulmonary edema by CXR. EF 40-45%. No clinical symptoms of ischemia. Improved some with diuresis but still very volume overloaded. Will continue IV lasix. Strict I/Os and daily weights. Sodium and fluid restriction. Consider addition of ACEi when renal function stabilized. Will switch metoprolol to carvedilol. Monitor renal indices closely.  2. CAD- asymptomatic. Continue ASA, nitrates, and beta blocker.  Revascularization with CABG in the future once infection cleared and patient recovered from recent surgery. 3. DM with history of poor control. 4. MSSA bacteremia. TEE negative for vegetation. On Ancef IV. 5. S/p right BKA for infected Charcot foot.  Thank you for the consult. We will follow with you.  SignedTheron Arista [facility] 07/12/2013, 3:20 PM

## 2013-07-12 NOTE — Progress Notes (Signed)
Patient ID: Albert [name], male   DOB: 09/16/1961, 52 y.o.   MRN: [id number] Subjective/Complaints: 52 y.o. 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 Admitted 06/25/2013 with nonhealing ulcer to the right foot and Charcot collapse right foot. Underwent irrigation and debridement of right foot plantar abscess 06/25/2013 per Dr. Magnus Ivan. Postoperatively with hypotension and tachycardia. Cardiology services consulted with findings of elevated troponin levels of 10. Echocardiogram with distal septal hypokinesis with overall low-normal LVEF. Elevated troponin levels are likely related to underlying sepsis. Underwent cardiac catheterization 06/28/2013 with findings of triple vessel CAD NSTEMI secondary to demand ischemia and mild LV systolic dysfunction. Patient placed on medical management with aspirin therapy. Patient with ongoing ischemic changes right lower extremity and underwent right below-knee amputation 06/29/2013 per Dr. Lajoyce Corners. Cardiothoracic surgery followup to consider CABG after recovery from BKA. TEE completed 07/03/2013 any vegetation that was negative with ejection fraction 45-50% .patient currently remains on Ancef therapy per infectious disease for sepsis /MSSA bacteremia with plan for 28 day duration  Breathing ok, much better Pt c/o SOB , O2 sats 100% RA  Review of Systems  Constitutional: Negative for fever and chills.  Gastrointestinal: Positive for constipation.  All other systems reviewed and are negative.    Objective: Vital Signs: Blood pressure 126/78, pulse 76, temperature 97.7 F (36.5 C), temperature source Oral, resp. rate 19, height 5' 10.87" (1.8 m), weight 131.679 kg (290 lb 4.8 oz), SpO2 99.00%. Dg Chest 2 View  07/12/2013   CLINICAL DATA:  Airspace  opacities in the lungs and pleural effusions.  EXAM: CHEST  2 VIEW  COMPARISON:  07/11/2013  FINDINGS: Left central line tip projects over the lower SVC.  Mildly increased airspace opacity in the right middle lobe. Otherwise the bilateral perihilar airspace opacities appear stable.  Cardiothoracic index 57%. Moderate pleural effusions on the lateral projection.  IMPRESSION: 1. Moderate bilateral pleural effusions. Perihilar airspace opacities with mild cardiomegaly favoring pulmonary edema over atypical pneumonia.   Electronically Signed   By: Herbie Baltimore   On: 07/12/2013 07:43   Dg Chest 2 View  07/11/2013   CLINICAL DATA:  Shortness breath. Cough. Diabetic and hypertensive patient.  EXAM: CHEST  2 VIEW  COMPARISON:  06/27/2013.  FINDINGS: Left PICC line tip distal superior vena cava level just above the cavoatrial junction.  Progressive diffuse asymmetric airspace disease with bilateral pleural effusions. Although pulmonary edema may explain this appearance, the patchy consolidation raises possibility of superimposed infection. Clinical correlation recommended. Followup until clearance recommended.  No gross pneumothorax.  Cardiomegaly.  IMPRESSION: Progressive diffuse asymmetric airspace disease with bilateral pleural effusions. Although pulmonary edema may explain this appearance, the patchy consolidation raises possibility of superimposed infection. Clinical correlation recommended.  Please see above.  These results will be called to the ordering clinician or representative by the Radiologist Assistant, and communication documented in the PACS Dashboard.   Electronically Signed   By: Bridgett Larsson   On: 07/11/2013 08:58   Results for orders placed during the hospital encounter of 07/03/13 (from the past 72 hour(s))  GLUCOSE, CAPILLARY     Status: Abnormal   Collection Time    07/09/13 11:38 AM      Result Value Range   Glucose-Capillary 176 (*) 70 - 99 mg/dL   Comment 1 Notify RN    GLUCOSE,  CAPILLARY  Status: Abnormal   Collection Time    07/09/13  4:30 PM      Result Value Range   Glucose-Capillary 183 (*) 70 - 99 mg/dL   Comment 1 Notify RN    GLUCOSE, CAPILLARY     Status: Abnormal   Collection Time    07/09/13  8:19 PM      Result Value Range   Glucose-Capillary 112 (*) 70 - 99 mg/dL  GLUCOSE, CAPILLARY     Status: Abnormal   Collection Time    07/10/13  7:34 AM      Result Value Range   Glucose-Capillary 143 (*) 70 - 99 mg/dL  GLUCOSE, CAPILLARY     Status: Abnormal   Collection Time    07/10/13 11:24 AM      Result Value Range   Glucose-Capillary 179 (*) 70 - 99 mg/dL  GLUCOSE, CAPILLARY     Status: Abnormal   Collection Time    07/10/13  4:34 PM      Result Value Range   Glucose-Capillary 119 (*) 70 - 99 mg/dL   Comment 1 Notify RN    GLUCOSE, CAPILLARY     Status: Abnormal   Collection Time    07/10/13  8:19 PM      Result Value Range   Glucose-Capillary 128 (*) 70 - 99 mg/dL  GLUCOSE, CAPILLARY     Status: Abnormal   Collection Time    07/11/13  7:38 AM      Result Value Range   Glucose-Capillary 157 (*) 70 - 99 mg/dL  BASIC METABOLIC PANEL     Status: Abnormal   Collection Time    07/11/13 10:50 AM      Result Value Range   Sodium 127 (*) 135 - 145 mEq/L   Potassium 5.0  3.5 - 5.1 mEq/L   Chloride 93 (*) 96 - 112 mEq/L   CO2 24  19 - 32 mEq/L   Glucose, Bld 245 (*) 70 - 99 mg/dL   BUN 82 (*) 6 - 23 mg/dL   Creatinine, Ser 1.61 (*) 0.50 - 1.35 mg/dL   Calcium 8.1 (*) 8.4 - 10.5 mg/dL   GFR calc non Af Amer 59 (*) >90 mL/min   GFR calc Af Amer 69 (*) >90 mL/min   Comment: (NOTE)     The eGFR has been calculated using the CKD EPI equation.     This calculation has not been validated in all clinical situations.     eGFR's persistently <90 mL/min signify possible Chronic Kidney     Disease.  CBC     Status: Abnormal   Collection Time    07/11/13 10:50 AM      Result Value Range   WBC 10.7 (*) 4.0 - 10.5 K/uL   RBC 3.12 (*) 4.22 - 5.81  MIL/uL   Hemoglobin 8.2 (*) 13.0 - 17.0 g/dL   HCT 09.6 (*) 04.5 - 40.9 %   MCV 80.4  78.0 - 100.0 fL   MCH 26.3  26.0 - 34.0 pg   MCHC 32.7  30.0 - 36.0 g/dL   RDW 81.1  91.4 - 78.2 %   Platelets 448 (*) 150 - 400 K/uL  GLUCOSE, CAPILLARY     Status: Abnormal   Collection Time    07/11/13 11:25 AM      Result Value Range   Glucose-Capillary 221 (*) 70 - 99 mg/dL   Comment 1 Notify RN    URINALYSIS, ROUTINE W REFLEX MICROSCOPIC  Status: None   Collection Time    07/11/13 12:46 PM      Result Value Range   Color, Urine YELLOW  YELLOW   APPearance CLEAR  CLEAR   Specific Gravity, Urine 1.017  1.005 - 1.030   pH 5.0  5.0 - 8.0   Glucose, UA NEGATIVE  NEGATIVE mg/dL   Hgb urine dipstick NEGATIVE  NEGATIVE   Bilirubin Urine NEGATIVE  NEGATIVE   Ketones, ur NEGATIVE  NEGATIVE mg/dL   Protein, ur NEGATIVE  NEGATIVE mg/dL   Urobilinogen, UA 0.2  0.0 - 1.0 mg/dL   Nitrite NEGATIVE  NEGATIVE   Leukocytes, UA NEGATIVE  NEGATIVE   Comment: MICROSCOPIC NOT DONE ON URINES WITH NEGATIVE PROTEIN, BLOOD, LEUKOCYTES, NITRITE, OR GLUCOSE <1000 mg/dL.  GLUCOSE, CAPILLARY     Status: Abnormal   Collection Time    07/11/13  4:16 PM      Result Value Range   Glucose-Capillary 192 (*) 70 - 99 mg/dL   Comment 1 Notify RN    PRO B NATRIURETIC PEPTIDE     Status: Abnormal   Collection Time    07/11/13  4:30 PM      Result Value Range   Pro B Natriuretic peptide (BNP) 23575.0 (*) 0 - 125 pg/mL  TROPONIN I     Status: None   Collection Time    07/11/13  4:30 PM      Result Value Range   Troponin I <0.30  <0.30 ng/mL   Comment:            Due to the release kinetics of cTnI,     a negative result within the first hours     of the onset of symptoms does not rule out     myocardial infarction with certainty.     If myocardial infarction is still suspected,     repeat the test at appropriate intervals.  SODIUM, URINE, RANDOM     Status: None   Collection Time    07/11/13  6:17 PM       Result Value Range   Sodium, Ur 21    OSMOLALITY, URINE     Status: None   Collection Time    07/11/13  6:17 PM      Result Value Range   Osmolality, Ur 422  390 - 1090 mOsm/kg   Comment: Performed at [facility]  GLUCOSE, CAPILLARY     Status: Abnormal   Collection Time    07/11/13  9:09 PM      Result Value Range   Glucose-Capillary 159 (*) 70 - 99 mg/dL   Comment 1 Notify RN    TROPONIN I     Status: None   Collection Time    07/11/13 11:00 PM      Result Value Range   Troponin I <0.30  <0.30 ng/mL   Comment:            Due to the release kinetics of cTnI,     a negative result within the first hours     of the onset of symptoms does not rule out     myocardial infarction with certainty.     If myocardial infarction is still suspected,     repeat the test at appropriate intervals.  TROPONIN I     Status: None   Collection Time    07/12/13  5:10 AM      Result Value Range   Troponin I <0.30  <0.30 ng/mL  Comment:            Due to the release kinetics of cTnI,     a negative result within the first hours     of the onset of symptoms does not rule out     myocardial infarction with certainty.     If myocardial infarction is still suspected,     repeat the test at appropriate intervals.  BASIC METABOLIC PANEL     Status: Abnormal   Collection Time    07/12/13  5:10 AM      Result Value Range   Sodium 130 (*) 135 - 145 mEq/L   Potassium 5.0  3.5 - 5.1 mEq/L   Chloride 97  96 - 112 mEq/L   CO2 25  19 - 32 mEq/L   Glucose, Bld 133 (*) 70 - 99 mg/dL   BUN 74 (*) 6 - 23 mg/dL   Creatinine, Ser 1.61  0.50 - 1.35 mg/dL   Calcium 8.0 (*) 8.4 - 10.5 mg/dL   GFR calc non Af Amer 64 (*) >90 mL/min   GFR calc Af Amer 74 (*) >90 mL/min   Comment: (NOTE)     The eGFR has been calculated using the CKD EPI equation.     This calculation has not been validated in all clinical situations.     eGFR's persistently <90 mL/min signify possible Chronic Kidney     Disease.   GLUCOSE, CAPILLARY     Status: Abnormal   Collection Time    07/12/13  7:38 AM      Result Value Range   Glucose-Capillary 140 (*) 70 - 99 mg/dL     HEENT: normal Cardio: RRR and murmur Resp: CTA B/L and unlabored, scattered wheezing GI: BS positive Extremity:  Pulses positive and No Edema Skin:   Breakdown Left distal MCP #3 , 2cm oval now closed and Wound C/D/I Neuro: Alert/Oriented, Cranial Nerve II-XII normal, Normal Sensory and Abnormal Motor 3-/5 ankle DF on left 4/5 bilateral hip flex, 5/5 B Delt,bi,tri,grip Musc/Skel:  Normal Gen: NAD   Assessment/Plan: 1. Functional deficits secondary to R BKA secondary to gangrene which require 3+ hours per day of interdisciplinary therapy in a comprehensive inpatient rehab setting. Physiatrist is providing close team supervision and 24 hour management of active medical problems listed below. Physiatrist and rehab team continue to assess barriers to discharge/monitor patient progress toward functional and medical goals.  FIM: FIM - Bathing Bathing Steps Patient Completed: Chest;Right Arm;Left Arm;Abdomen;Left upper leg;Front perineal area;Right upper leg;Buttocks Bathing: 5: Supervision: Safety issues/verbal cues  FIM - Upper Body Dressing/Undressing Upper body dressing/undressing steps patient completed: Thread/unthread right sleeve of pullover shirt/dresss;Thread/unthread left sleeve of pullover shirt/dress;Put head through opening of pull over shirt/dress;Pull shirt over trunk Upper body dressing/undressing: 5: Set-up assist to: Obtain clothing/put away FIM - Lower Body Dressing/Undressing Lower body dressing/undressing steps patient completed: Thread/unthread left pants leg;Thread/unthread right pants leg Lower body dressing/undressing: 3: Mod-Patient completed 50-74% of tasks  FIM - Toileting Toileting steps completed by patient:  (not wearing clothing) Toileting: 0: Activity did not occur  FIM - Quarry manager Devices: Bedside commode Toilet Transfers: 4-To toilet/BSC: Min A (steadying Pt. > 75%);3-From toilet/BSC: Mod A (lift or lower assist)  FIM - Press photographer Assistive Devices: Arm rests Bed/Chair Transfer: 0: Activity did not occur  FIM - Locomotion: Wheelchair Distance: 80 Locomotion: Wheelchair: 0: Activity did not occur FIM - Locomotion: Ambulation Ambulation/Gait Assistance: Not tested (comment) Locomotion: Ambulation: 0:  Activity did not occur  Comprehension Comprehension Mode: Auditory Comprehension: 5-Follows basic conversation/direction: With no assist  Expression Expression Mode: Verbal Expression: 6-Expresses complex ideas: With extra time/assistive device  Social Interaction Social Interaction: 6-Interacts appropriately with others with medication or extra time (anti-anxiety, antidepressant).  Problem Solving Problem Solving: 5-Solves basic problems: With no assist  Memory Memory: 6-More than reasonable amt of time  Medical Problem List and Plan:  1. Right BKA due to a Charcot foot/abscess, prior left CVA with residual right-sided weakness  2. DVT Prophylaxis/Anticoagulation: SCDs left lower extremity.  3. Pain Management: Lyrica 75 mg twice a day, Oxycodone , titrate the dose. Monitor with increased mobility  4. Neuropsych: This patient is capable of making decisions on his own behalf.  5. CAD/NSTEMI. Continue aspirin therapy. Plan followup cardiothoracic surgery 6-8 weeks to consider CABG.    6. ID. Sepsis/MSSA bacteremia. Continue Ancef as directed x28 days total initiated 06/25/2013.  7. Diabetes mellitus with peripheral neuropathy, uncontrolled. Hemoglobin A1c 8.2. Levemir 25  will titrate, NovoLog 3 times a day. Check blood sugars a.c. and at bedtime  8. Hypertension. Lopressor 50 mg twice a day. Monitor with increased mobility  9. Acute blood loss anemia. Latest hemoglobin 10.8. Followup CBC  10. Recent multi-toe  amputation left foot. Continue wound care as advised. Monitor for increased ischemic changes. Need to be careful with transfers or any attempts with gait. Followup vascular surgery , appreciate WOC consult       11.  Peripheral edema ACE wrap, no diuretic due to hyponatremia 12.  SOB improved dramatically likely pleural effusions, appreciate pulmanry responded to lasix , monitor hypo Na   LOS (Days) 9 A FACE TO FACE EVALUATION WAS PERFORMED  Dany Walther E 07/12/2013, 7:50 AM

## 2013-07-12 NOTE — Progress Notes (Signed)
Physical Therapy Weekly Progress Note  Patient Details  Name: Albert [name] MRN: [id number] Date of Birth: 05-Sep-1961  Today's Date: 07/12/2013 Time: 0830-0930 Time Calculation (min): 60 min  Patient has met 2 of 4 short term goals.  Patient did not meet bed<>chair transfer or sit<>stand goals, however is progressing towards meeting both goals. Focus of treatment is no longer on sit<>stands, secondary to patients evolving cardiac and pulmonary diagnoses. LTGs are at wheelchair level and therefore, sit<>stands are not necessary for d/c home.   Patient continues to demonstrate the following deficits: activity tolerance, balance, postural control, coordination, strength, wheelchair management and overall functional mobility and therefore will continue to benefit from skilled PT intervention to enhance overall performance with activity tolerance, balance, postural control, ability to compensate for deficits, functional use of  right upper extremity, coordination and knowledge of precautions.  Patient not progressing toward long term goals.  See goal revision..  Plan of care revisions: LTGs downgraded on 07/12/13, secondary to evolving cardiac and pulmonary issues. .  PT Short Term Goals Week 1:  PT Short Term Goal 1 (Week 1): Patient will perform bed mobility from flat surface without use of bedrails with supervision. PT Short Term Goal 1 - Progress (Week 1): Met PT Short Term Goal 2 (Week 1): Patient will perform bed<>chair transfers to bilateral sides with minA. PT Short Term Goal 2 - Progress (Week 1): Progressing toward goal PT Short Term Goal 3 (Week 1): Patient will perform sit<>stand transfer with RW with maxA x1. PT Short Term Goal 3 - Progress (Week 1): Progressing toward goal PT Short Term Goal 4 (Week 1): Patient will perform wheelchair mobility x50' with minA. PT Short Term Goal 4 - Progress (Week 1): Met Week 2:  PT Short Term Goal 1 (Week 2): STGs=LTGs  Skilled Therapeutic  Interventions/Progress Updates:    AM session: Patient received supine in bed, on 2.0L/min O2 via [place] and receiving IV fluids/medications. Patient reports that he feels "much better" this morning. Per nursing patient is to remain on 2.0L O2 during session. Session focused on bed mobility, wheelchair mobility, and transfers. Patient supine>sit supervision level with HOB flat and use of bed rail. Patient seated on EOB, maxA to don pants, with verbal cueing to lean R<>L in order to pull pants up. Patient instructed in squat pivot transfer bed>wheelchair modA, with verbal/visual cueing for hand placement and for lateral scoots to better position for transfer to chair. Patient instructed in management of wheelchair for transfer set up, required verbal/visual cueing for placement of chair next to mat table, locking of brakes, and removal of armrest. Instructed patient in wheelchair<>elevated mat table x2 trials. Patient modA wheelchair>mat table (to L side) both trials, with verbal cueing for hand placement and instruction to push off armrest with RUE, increased difficulty with tranfers to mat table (no armrest to pull on for leverage?). Patient minA mat table>wheelchair first trial, with verbal cueing for hand placement and for lateral scoot for positioning prior to transfer. Second trial mat>wheelchair patient close supervision, with verbal/visual cues for positioning close to chair on edge of mat prior to transfer. Patient instructed in wheelchair mobility 30' supervision level, with use of BUE and LLE for propulsion, patient demonstrates improved wheelchair mobility due to theraband/tubing on R wheel for increased grip. Patient returned to room, seated in chair with all needs within reach.   PM session:  Patient received seated in wheelchair in gym. Session focused on transfers and LE strengthening exercises. Patient instructed in wheelchair >  level height mat transfer modA( to R side)  with verbal cueing for  placement of wheelchair next to mat, removal of armrest and hand placement prior to transfer. Wheelchair > bed modA ( to R side), with verbal cueing for hand placement and anterior lean. Mat table > wheelchair transfer (to L side) close supervision, verbal cueing for hand placement and lateral scoots for positioning on edge of mat given prior to transfer. Patient supine<>sit supervision level, verbal cue for lateral scoot toward head of mat prior to laying down. Patient instructed in mat exercises: 15x2 seated LAQ with 3# weight LLE, 15x1 seated marching, 15x1 supine SLR 3# weight on LLE, 15x1 supine knee to chest. Verbal/visual cueing for slow and controlled movements during exercises, to increase isolated muscle strengthening. Patient reported  feeling SOB during exercises, returned to sitting position on edge of mat, spO2 95%, HR 80, BP 129/84. Patient returned to room, cardiologist in room to meet with patient, returned supine in bed with bed alarm on and all needs within in reach.    Therapy Documentation Precautions:  Precautions Precautions: Fall Precaution Comments: watch HR Restrictions Weight Bearing Restrictions: No RLE Weight Bearing: Non weight bearing (Right BKA) Other Position/Activity Restrictions: elevate operated limb, do not prop pillows under knee; try to position on LEFT side so that RIGHT hip can be in neutral/extension General:   Vital Signs: Therapy Vitals Temp: 97.6 F (36.4 C) Temp src: Oral Pulse Rate: 80 Resp: 19 BP: 112/80 mmHg Patient Position, if appropriate: Sitting Oxygen Therapy SpO2: 100 % O2 Device: None (Room air) Pain:   Mobility:   Locomotion :    Trunk/Postural Assessment :    Balance:   Exercises:   Other Treatments:    See FIM for current functional status  Therapy/Group: Individual Therapy  Doralee Albino 07/12/2013, 5:39 PM

## 2013-07-13 ENCOUNTER — Encounter (HOSPITAL_COMMUNITY): Payer: 59 | Admitting: *Deleted

## 2013-07-13 DIAGNOSIS — I2510 Atherosclerotic heart disease of native coronary artery without angina pectoris: Secondary | ICD-10-CM

## 2013-07-13 LAB — BASIC METABOLIC PANEL
CO2: 27 mEq/L (ref 19–32)
Chloride: 99 mEq/L (ref 96–112)
Creatinine, Ser: 1.25 mg/dL (ref 0.50–1.35)
GFR calc Af Amer: 76 mL/min — ABNORMAL LOW (ref >90)
GFR calc non Af Amer: 66 mL/min — ABNORMAL LOW (ref >90)
Glucose, Bld: 135 mg/dL — ABNORMAL HIGH (ref 70–99)
Potassium: 5 mEq/L (ref 3.5–5.1)
Sodium: 131 mEq/L — ABNORMAL LOW (ref 135–145)

## 2013-07-13 LAB — GLUCOSE, CAPILLARY
Glucose-Capillary: 130 mg/dL — ABNORMAL HIGH (ref 70–99)
Glucose-Capillary: 131 mg/dL — ABNORMAL HIGH (ref 70–99)
Glucose-Capillary: 154 mg/dL — ABNORMAL HIGH (ref 70–99)

## 2013-07-13 NOTE — Progress Notes (Signed)
At 1445, checked pts dsg to R BKA post fall on previous shift. Noted drainage starting to soak through this dsg, which has been in place since fall this morning around 0600. Removed soiled dsg and noted R lateral aspect of incision to be dehisced, approximately 4cm by 2.5cm with dark red tissue exposed, 3 staples appear to be detached from edges of incision. Site actively bleeding upon removal of dressing. Notified MD who was present on the unit to assess wound. MD contacted ortho- Dr. Ophelia Charter, who recommended cleansing wound with betadine, applying 4x4s, kerlex, and ace wrap and to call if bleeding increases. BKA redressed at this time per order. Pt reports pain "isn't bad" right now. Will continue to monitor dsg closely. Also, noted BP to be running in 90s systolic, informed MD who placed parameters on coreg. Will continue to monitor BP closely as well. Mick Sell RN

## 2013-07-13 NOTE — Progress Notes (Addendum)
Physical Therapy Note  Patient Details  Name: Albert [name] MRN: [id number] Date of Birth: 07/11/1961 Today's Date: 07/13/2013  1100 Pt missed 60 minutes PT group session ( OEG/ Leg exercise group) secondary to refusal (pt states he fell last PM and RT BKA still painful).     Swayzie Choate,JIM 07/13/2013, 11:51 AM

## 2013-07-13 NOTE — Significant Event (Signed)
Patient was attempting to transfer to [facility] with NT when his L foot slipped from under him and caused him to slide down to the floor.  RN was made aware, vitals were taken and stable (see flowsheet), patient was transferred from the floor to the bed with lift machine. Patient complained of R BKA pain, where he stated he hit the floor with. Significant bleeding through the shrinker noted. MD on call was paged and ordered to re-dress R BKA and he will see patient during morning rounds. Patient's BKA dressing was undone and R side of the incision with staples had dehisced with moderate bleeding. Moist saline gauze was placed over area of bleeding with non-adherent dressing, abd pads,  Kerlex, and ACE wrap placed. 15 mg of Oxycodone PRN and 650 mg of Acetaminophen administered to help with pain. Patient now resting in bed, R BKA elevated. Patient's significant other was attempted to be contacted but voicemail was not set up; however, patient was able to contact her and update her on the fall. Will report to morning RN and continue monitoring.

## 2013-07-13 NOTE — Progress Notes (Signed)
   SUBJECTIVE:  He fell last night.  Right leg is very sore.    PHYSICAL EXAM Filed Vitals:   07/12/13 0933 07/12/13 1519 07/13/13 0512 07/13/13 0820  BP:  112/80 130/81   Pulse:  80 84   Temp:  97.6 F (36.4 C)    TempSrc:  Oral    Resp:  19    Height:      Weight:      SpO2: 99% 100% 99% 100%   General:  No distress Lungs:  Clear Heart:  RRR Abdomen:  Positive bowel sounds, no rebound no guarding Extremities:  Severe edema   LABS: Lab Results  Component Value Date   TROPONINI <0.30 07/12/2013   Results for orders placed during the hospital encounter of 07/03/13 (from the past 24 hour(s))  GLUCOSE, CAPILLARY     Status: Abnormal   Collection Time    07/12/13 11:34 AM      Result Value Range   Glucose-Capillary 164 (*) 70 - 99 mg/dL  GLUCOSE, CAPILLARY     Status: None   Collection Time    07/12/13  4:50 PM      Result Value Range   Glucose-Capillary 96  70 - 99 mg/dL  GLUCOSE, CAPILLARY     Status: Abnormal   Collection Time    07/12/13  9:07 PM      Result Value Range   Glucose-Capillary 109 (*) 70 - 99 mg/dL   Comment 1 Notify RN    GLUCOSE, CAPILLARY     Status: Abnormal   Collection Time    07/13/13  7:36 AM      Result Value Range   Glucose-Capillary 130 (*) 70 - 99 mg/dL    Intake/Output Summary (Last 24 hours) at 07/13/13 1610 Last data filed at 07/13/13 0630  Gross per 24 hour  Intake   1030 ml  Output   2500 ml  Net  -1470 ml    ASSESSMENT AND PLAN:  Acute systolic CHF:   Good UO yesterday.  Will continue IV lasix at current dose. Strict I/Os and daily weights. Sodium and fluid restriction. I will add ACE inhibitor when creat improved.  I have ordered daily BMET.   CAD- asymptomatic. Continue ASA, nitrates, and beta blocker. Revascularization with CABG in the future once infection cleared and patient recovered from recent surgery.   MSSA bacteremia. TEE negative for vegetation. On Ancef IV.    Rollene Rotunda 07/13/2013 8:22 AM

## 2013-07-13 NOTE — Progress Notes (Addendum)
Albert [name] is a 52 y.o. male 1960-11-12 [id number]  Subjective: Fall last PM transferring from bed to [facility] - hit stump with subsequent increase in pain - Bleeding through dressing, now resolved with dressing change - otherwise slept well and feeling OK.  Objective: Vital signs in last 24 hours: Temp:  [97.6 F (36.4 C)] 97.6 F (36.4 C) (09/26 1519) Pulse Rate:  [80-84] 84 (09/27 0512) Resp:  [19] 19 (09/26 1519) BP: (112-130)/(80-81) 130/81 mmHg (09/27 0512) SpO2:  [99 %-100 %] 100 % (09/27 0820) Weight change:  Last BM Date: 07/13/13  Intake/Output from previous day: 09/26 0701 - 09/27 0700 In: 1150 [P.O.:600; IV Piggyback:550] Out: 2500 [Urine:2500]  Physical Exam General: No apparent distress, easily awoken from sleep    Lungs: Normal effort. Lungs clear to auscultation, diminished at bases due to effort. Cardiovascular: Regular rate and rhythm,  Musculoskeletal:  S/p r BKA - dressing c/d/i -     Lab Results: BMET    Component Value Date/Time   NA 130* 07/12/2013 0510   K 5.0 07/12/2013 0510   CL 97 07/12/2013 0510   CO2 25 07/12/2013 0510   GLUCOSE 133* 07/12/2013 0510   BUN 74* 07/12/2013 0510   CREATININE 1.28 07/12/2013 0510   CALCIUM 8.0* 07/12/2013 0510   GFRNONAA 64* 07/12/2013 0510   GFRAA 74* 07/12/2013 0510   CBC    Component Value Date/Time   WBC 10.7* 07/11/2013 1050   RBC 3.12* 07/11/2013 1050   HGB 8.2* 07/11/2013 1050   HCT 25.1* 07/11/2013 1050   PLT 448* 07/11/2013 1050   MCV 80.4 07/11/2013 1050   MCH 26.3 07/11/2013 1050   MCHC 32.7 07/11/2013 1050   RDW 14.8 07/11/2013 1050   LYMPHSABS 1.9 07/04/2013 0500   MONOABS 1.0 07/04/2013 0500   EOSABS 0.1 07/04/2013 0500   BASOSABS 0.0 07/04/2013 0500   CBG's (last 3):   Recent Labs  07/12/13 1650 07/12/13 2107 07/13/13 0736  GLUCAP 96 109* 130*   LFT's Lab Results  Component Value Date   ALT 16 07/04/2013   AST 34 07/04/2013   ALKPHOS 88 07/04/2013   BILITOT 0.3 07/04/2013     Studies/Results: Dg Chest 2 View  07/12/2013   CLINICAL DATA:  Airspace opacities in the lungs and pleural effusions.  EXAM: CHEST  2 VIEW  COMPARISON:  07/11/2013  FINDINGS: Left central line tip projects over the lower SVC.  Mildly increased airspace opacity in the right middle lobe. Otherwise the bilateral perihilar airspace opacities appear stable.  Cardiothoracic index 57%. Moderate pleural effusions on the lateral projection.  IMPRESSION: 1. Moderate bilateral pleural effusions. Perihilar airspace opacities with mild cardiomegaly favoring pulmonary edema over atypical pneumonia.   Electronically Signed   By: Herbie Baltimore   On: 07/12/2013 07:43    Medications:  I have reviewed the patient's current medications. Scheduled Medications: . aspirin  81 mg Oral Daily  . carvedilol  25 mg Oral BID WC  . ceFEPime (MAXIPIME) IV  1 g Intravenous Q8H  . dextromethorphan-guaiFENesin  1 tablet Oral BID  . insulin aspart  0-15 Units Subcutaneous TID WC  . insulin aspart  8 Units Subcutaneous TID WC  . insulin detemir  35 Units Subcutaneous Daily  . isosorbide mononitrate  30 mg Oral Daily  . levofloxacin  500 mg Oral Daily  . pantoprazole  40 mg Oral Daily  . pregabalin  75 mg Oral BID  . senna-docusate  2 tablet Oral BID  . simvastatin  40  mg Oral QPM  . tamsulosin  0.4 mg Oral QPC supper  . vancomycin  1,250 mg Intravenous Q12H   PRN Medications: acetaminophen, acetaminophen, albuterol, bisacodyl, menthol-cetylpyridinium, ondansetron (ZOFRAN) IV, ondansetron, oxyCODONE, sodium chloride, sorbitol, traZODone  Assessment/Plan: Principal Problem:   Hx of BKA Active Problems:   Coronary atherosclerosis of native coronary artery   Cardiomyopathy, ischemic   Acute pulmonary edema   Pneumonia, organism unspecified   Hypoxemia   Acute systolic heart failure   Length of stay, days: 10  Continue wound care and ongoing rehab efforts No medication changes recommended today  [name]  A. Felicity Coyer, MD 07/13/2013, 10:26 AM

## 2013-07-14 ENCOUNTER — Inpatient Hospital Stay (HOSPITAL_COMMUNITY)
Admission: AD | Admit: 2013-07-14 | Discharge: 2013-07-26 | DRG: 280 | Disposition: A | Discharge status: Rehabilitation Facility | Payer: 59 | Source: Ambulatory Visit | Attending: Internal Medicine | Admitting: Internal Medicine

## 2013-07-14 ENCOUNTER — Inpatient Hospital Stay (HOSPITAL_COMMUNITY): Payer: 59

## 2013-07-14 DIAGNOSIS — J9600 Acute respiratory failure, unspecified whether with hypoxia or hypercapnia: Secondary | ICD-10-CM

## 2013-07-14 DIAGNOSIS — Z79899 Other long term (current) drug therapy: Secondary | ICD-10-CM

## 2013-07-14 DIAGNOSIS — I340 Nonrheumatic mitral (valve) insufficiency: Secondary | ICD-10-CM

## 2013-07-14 DIAGNOSIS — N179 Acute kidney failure, unspecified: Secondary | ICD-10-CM | POA: Diagnosis present

## 2013-07-14 DIAGNOSIS — D62 Acute posthemorrhagic anemia: Secondary | ICD-10-CM | POA: Diagnosis present

## 2013-07-14 DIAGNOSIS — I96 Gangrene, not elsewhere classified: Secondary | ICD-10-CM | POA: Diagnosis present

## 2013-07-14 DIAGNOSIS — E878 Other disorders of electrolyte and fluid balance, not elsewhere classified: Secondary | ICD-10-CM

## 2013-07-14 DIAGNOSIS — I214 Non-ST elevation (NSTEMI) myocardial infarction: Secondary | ICD-10-CM

## 2013-07-14 DIAGNOSIS — I255 Ischemic cardiomyopathy: Secondary | ICD-10-CM

## 2013-07-14 DIAGNOSIS — I10 Essential (primary) hypertension: Secondary | ICD-10-CM | POA: Diagnosis present

## 2013-07-14 DIAGNOSIS — E871 Hypo-osmolality and hyponatremia: Secondary | ICD-10-CM

## 2013-07-14 DIAGNOSIS — I959 Hypotension, unspecified: Secondary | ICD-10-CM

## 2013-07-14 DIAGNOSIS — S88119A Complete traumatic amputation at level between knee and ankle, unspecified lower leg, initial encounter: Secondary | ICD-10-CM

## 2013-07-14 DIAGNOSIS — I2589 Other forms of chronic ischemic heart disease: Secondary | ICD-10-CM | POA: Diagnosis present

## 2013-07-14 DIAGNOSIS — I509 Heart failure, unspecified: Secondary | ICD-10-CM | POA: Diagnosis present

## 2013-07-14 DIAGNOSIS — E875 Hyperkalemia: Secondary | ICD-10-CM

## 2013-07-14 DIAGNOSIS — I2510 Atherosclerotic heart disease of native coronary artery without angina pectoris: Secondary | ICD-10-CM

## 2013-07-14 DIAGNOSIS — N170 Acute kidney failure with tubular necrosis: Secondary | ICD-10-CM | POA: Diagnosis present

## 2013-07-14 DIAGNOSIS — I059 Rheumatic mitral valve disease, unspecified: Secondary | ICD-10-CM | POA: Diagnosis present

## 2013-07-14 DIAGNOSIS — R579 Shock, unspecified: Secondary | ICD-10-CM

## 2013-07-14 DIAGNOSIS — R7881 Bacteremia: Secondary | ICD-10-CM

## 2013-07-14 DIAGNOSIS — I5021 Acute systolic (congestive) heart failure: Secondary | ICD-10-CM

## 2013-07-14 DIAGNOSIS — B9561 Methicillin susceptible Staphylococcus aureus infection as the cause of diseases classified elsewhere: Secondary | ICD-10-CM

## 2013-07-14 DIAGNOSIS — N5089 Other specified disorders of the male genital organs: Secondary | ICD-10-CM | POA: Diagnosis not present

## 2013-07-14 DIAGNOSIS — S98139A Complete traumatic amputation of one unspecified lesser toe, initial encounter: Secondary | ICD-10-CM

## 2013-07-14 DIAGNOSIS — E1369 Other specified diabetes mellitus with other specified complication: Secondary | ICD-10-CM | POA: Diagnosis present

## 2013-07-14 DIAGNOSIS — J810 Acute pulmonary edema: Secondary | ICD-10-CM

## 2013-07-14 DIAGNOSIS — Z7982 Long term (current) use of aspirin: Secondary | ICD-10-CM

## 2013-07-14 DIAGNOSIS — D649 Anemia, unspecified: Secondary | ICD-10-CM

## 2013-07-14 DIAGNOSIS — IMO0002 Reserved for concepts with insufficient information to code with codable children: Secondary | ICD-10-CM

## 2013-07-14 DIAGNOSIS — I6529 Occlusion and stenosis of unspecified carotid artery: Secondary | ICD-10-CM | POA: Diagnosis present

## 2013-07-14 DIAGNOSIS — I498 Other specified cardiac arrhythmias: Secondary | ICD-10-CM | POA: Diagnosis not present

## 2013-07-14 DIAGNOSIS — G609 Hereditary and idiopathic neuropathy, unspecified: Secondary | ICD-10-CM | POA: Diagnosis present

## 2013-07-14 DIAGNOSIS — R0902 Hypoxemia: Secondary | ICD-10-CM | POA: Diagnosis present

## 2013-07-14 DIAGNOSIS — E1165 Type 2 diabetes mellitus with hyperglycemia: Secondary | ICD-10-CM | POA: Diagnosis not present

## 2013-07-14 DIAGNOSIS — R34 Anuria and oliguria: Secondary | ICD-10-CM

## 2013-07-14 DIAGNOSIS — Z794 Long term (current) use of insulin: Secondary | ICD-10-CM

## 2013-07-14 DIAGNOSIS — K7200 Acute and subacute hepatic failure without coma: Secondary | ICD-10-CM | POA: Diagnosis not present

## 2013-07-14 DIAGNOSIS — E669 Obesity, unspecified: Secondary | ICD-10-CM | POA: Diagnosis present

## 2013-07-14 DIAGNOSIS — Z8673 Personal history of transient ischemic attack (TIA), and cerebral infarction without residual deficits: Secondary | ICD-10-CM

## 2013-07-14 LAB — BASIC METABOLIC PANEL
BUN: 75 mg/dL — ABNORMAL HIGH (ref 6–23)
CO2: 23 mEq/L (ref 19–32)
CO2: 23 mEq/L (ref 19–32)
Calcium: 7.4 mg/dL — ABNORMAL LOW (ref 8.4–10.5)
Calcium: 7.5 mg/dL — ABNORMAL LOW (ref 8.4–10.5)
Chloride: 99 mEq/L (ref 96–112)
GFR calc non Af Amer: 53 mL/min — ABNORMAL LOW (ref >90)
GFR calc non Af Amer: 43 mL/min — ABNORMAL LOW (ref >90)
Glucose, Bld: 220 mg/dL — ABNORMAL HIGH (ref 70–99)
Potassium: 5.9 mEq/L — ABNORMAL HIGH (ref 3.5–5.1)
Potassium: 5.9 mEq/L — ABNORMAL HIGH (ref 3.5–5.1)
Sodium: 128 mEq/L — ABNORMAL LOW (ref 135–145)
Sodium: 131 mEq/L — ABNORMAL LOW (ref 135–145)

## 2013-07-14 LAB — VANCOMYCIN, TROUGH: Vancomycin Tr: 30 ug/mL (ref 10.0–20.0)

## 2013-07-14 LAB — GLUCOSE, CAPILLARY: Glucose-Capillary: 189 mg/dL — ABNORMAL HIGH (ref 70–99)

## 2013-07-14 LAB — BLOOD GAS, ARTERIAL
Bicarbonate: 18.9 mEq/L — ABNORMAL LOW (ref 20.0–24.0)
Patient temperature: 98.6
TCO2: 20 mmol/L (ref 0–100)
pH, Arterial: 7.375 (ref 7.350–7.450)

## 2013-07-14 MED ORDER — SODIUM CHLORIDE 0.9 % IV BOLUS (SEPSIS): 500.0000 mL | Freq: Once | INTRAVENOUS | Status: DC

## 2013-07-14 MED ORDER — FUROSEMIDE 10 MG/ML IJ SOLN
40.0000 mg | Freq: Once | INTRAMUSCULAR | Status: AC
  Administered 2013-07-14: 40 mg via INTRAVENOUS
  Filled 2013-07-14: qty 4

## 2013-07-14 MED ORDER — ONDANSETRON HCL 4 MG/2ML IJ SOLN
4.0000 mg | Freq: Four times a day (QID) | INTRAMUSCULAR | Status: DC | PRN
  Administered 2013-07-15 – 2013-07-20 (×3): 4 mg via INTRAVENOUS
  Filled 2013-07-14 (×3): qty 2

## 2013-07-14 MED ORDER — SODIUM CHLORIDE 0.9 % IV SOLN: 250.0000 mL | INTRAVENOUS | Status: DC | PRN

## 2013-07-14 MED ORDER — NOREPINEPHRINE BITARTRATE 1 MG/ML IJ SOLN
2.0000 ug/min | INTRAVENOUS | Status: DC
  Filled 2013-07-14: qty 8

## 2013-07-14 MED ORDER — HYDROCORTISONE SOD SUCCINATE 100 MG IJ SOLR
100.0000 mg | Freq: Three times a day (TID) | INTRAMUSCULAR | Status: DC
  Administered 2013-07-15: 100 mg via INTRAVENOUS
  Filled 2013-07-14 (×2): qty 2

## 2013-07-14 MED ORDER — SODIUM CHLORIDE 0.9 % IV SOLN: INTRAVENOUS | Status: DC

## 2013-07-14 MED ORDER — SODIUM CHLORIDE 0.9 % IV BOLUS (SEPSIS)
250.0000 mL | Freq: Once | INTRAVENOUS | Status: AC
  Administered 2013-07-14: 250 mL via INTRAVENOUS

## 2013-07-14 MED ORDER — DOPAMINE-DEXTROSE 3.2-5 MG/ML-% IV SOLN
5.0000 ug/kg/min | INTRAVENOUS | Status: DC
  Administered 2013-07-14: 5 ug/kg/min via INTRAVENOUS
  Filled 2013-07-14: qty 250

## 2013-07-14 MED ORDER — VANCOMYCIN HCL IN DEXTROSE 750-5 MG/150ML-% IV SOLN
750.0000 mg | Freq: Two times a day (BID) | INTRAVENOUS | Status: DC
  Administered 2013-07-14: 750 mg via INTRAVENOUS
  Filled 2013-07-14 (×2): qty 150

## 2013-07-14 MED ORDER — FUROSEMIDE 10 MG/ML IJ SOLN
40.0000 mg | Freq: Two times a day (BID) | INTRAMUSCULAR | Status: DC
  Administered 2013-07-14: 40 mg via INTRAVENOUS
  Filled 2013-07-14 (×3): qty 4

## 2013-07-14 MED ORDER — DOPAMINE-DEXTROSE 3.2-5 MG/ML-% IV SOLN: 2.0000 ug/kg/min | INTRAVENOUS | Status: DC

## 2013-07-14 MED ORDER — HEPARIN SODIUM (PORCINE) 5000 UNIT/ML IJ SOLN
5000.0000 [IU] | Freq: Three times a day (TID) | INTRAMUSCULAR | Status: DC
  Administered 2013-07-14: 5000 [IU] via SUBCUTANEOUS
  Filled 2013-07-14 (×2): qty 1

## 2013-07-14 MED ORDER — ASPIRIN 81 MG PO CHEW: 324.0000 mg | CHEWABLE_TABLET | ORAL | Status: AC

## 2013-07-14 MED ORDER — SODIUM CHLORIDE 0.9 % IV SOLN
1.0000 g | Freq: Once | INTRAVENOUS | Status: AC
  Administered 2013-07-14: 1 g via INTRAVENOUS
  Filled 2013-07-14: qty 10

## 2013-07-14 MED ORDER — ASPIRIN 300 MG RE SUPP
300.0000 mg | RECTAL | Status: AC
  Administered 2013-07-14: 300 mg via RECTAL
  Filled 2013-07-14: qty 1

## 2013-07-14 NOTE — Progress Notes (Signed)
Patient ID: Albert [name], male   DOB: 1960/10/27, 52 y.o.   MRN: [id number]  Subjective: Fall 9/25 PM transferring from bed to [facility] - hit stump with subsequent increase in pain - Bleeding through dressing, increase in freq dressing change - otherwise slept well and feeling OK.  Objective: Vital signs in last 24 hours: Temp:  [94.5 F (34.7 C)-97.5 F (36.4 C)] 97.3 F (36.3 C) (09/28 0500) Pulse Rate:  [72-100] 100 (09/28 0500) Resp:  [18-19] 18 (09/28 0500) BP: (89-97)/(52-65) 95/65 mmHg (09/28 0846) SpO2:  [95 %-99 %] 95 % (09/28 0520) Weight:  [131.861 kg (290 lb 11.2 oz)-133.902 kg (295 lb 3.2 oz)] 133.902 kg (295 lb 3.2 oz) (09/28 0951) Weight change:  Last BM Date: 07/13/13  Intake/Output from previous day: 09/27 0701 - 09/28 0700 In: 480 [P.O.:480] Out: 1125 [Urine:1125]  Physical Exam General: No apparent distress, easily awoken from sleep    Lungs: Normal effort. Lungs clear to auscultation, diminished at bases due to effort. Cardiovascular: Regular rate and rhythm, very edematous BUE and LLE Musculoskeletal:  S/p r BKA - wound dehiscence with open 3cm x 4cm wound, staples pulled through at lateral edge of incision  - slow active oozing blood  Lab Results: BMET    Component Value Date/Time   NA 131* 07/13/2013 1250   K 5.0 07/13/2013 1250   CL 99 07/13/2013 1250   CO2 27 07/13/2013 1250   GLUCOSE 135* 07/13/2013 1250   BUN 67* 07/13/2013 1250   CREATININE 1.25 07/13/2013 1250   CALCIUM 7.4* 07/13/2013 1250   GFRNONAA 66* 07/13/2013 1250   GFRAA 76* 07/13/2013 1250   CBC    Component Value Date/Time   WBC 10.7* 07/11/2013 1050   RBC 3.12* 07/11/2013 1050   HGB 8.2* 07/11/2013 1050   HCT 25.1* 07/11/2013 1050   PLT 448* 07/11/2013 1050   MCV 80.4 07/11/2013 1050   MCH 26.3 07/11/2013 1050   MCHC 32.7 07/11/2013 1050   RDW 14.8 07/11/2013 1050   LYMPHSABS 1.9 07/04/2013 0500   MONOABS 1.0 07/04/2013 0500   EOSABS 0.1 07/04/2013 0500   BASOSABS 0.0 07/04/2013 0500   CBG's  (last 3):    Recent Labs  07/13/13 1610 07/13/13 2031 07/14/13 0730  GLUCAP 131* 154* 235*   LFT's Lab Results  Component Value Date   ALT 16 07/04/2013   AST 34 07/04/2013   ALKPHOS 88 07/04/2013   BILITOT 0.3 07/04/2013    Studies/Results: No results found.  Medications:  I have reviewed the patient's current medications. Scheduled Medications: . aspirin  81 mg Oral Daily  . carvedilol  25 mg Oral BID WC  . ceFEPime (MAXIPIME) IV  1 g Intravenous Q8H  . dextromethorphan-guaiFENesin  1 tablet Oral BID  . furosemide  40 mg Intravenous BID  . insulin aspart  0-15 Units Subcutaneous TID WC  . insulin aspart  8 Units Subcutaneous TID WC  . insulin detemir  35 Units Subcutaneous Daily  . isosorbide mononitrate  30 mg Oral Daily  . pantoprazole  40 mg Oral Daily  . pregabalin  75 mg Oral BID  . senna-docusate  2 tablet Oral BID  . simvastatin  40 mg Oral QPM  . tamsulosin  0.4 mg Oral QPC supper  . vancomycin  1,250 mg Intravenous Q12H   PRN Medications: acetaminophen, acetaminophen, albuterol, bisacodyl, menthol-cetylpyridinium, ondansetron (ZOFRAN) IV, ondansetron, oxyCODONE, sodium chloride, sorbitol, traZODone  Assessment/Plan: Principal Problem:   Hx of BKA Active Problems:   Coronary atherosclerosis  of native coronary artery   Cardiomyopathy, ischemic   Acute pulmonary edema   Pneumonia, organism unspecified   Hypoxemia   Acute systolic heart failure borderline hypotension - parameters on Coreg placed yesterday - Imdur and now additional Lasix to continue per cards - will check CBC in AM given bleeding from wound - see next   dehiscence of BKA following accidental trauma 9/25 PM - I spoke with Dr Ophelia Charter (covering on call for Dr Lajoyce Corners) yesterday 9/26 afternoon who advised betadine wash and continued dressing changes - he will evaluate in person today per our phone conversation yesterday - nursing aware of same. continue dressing change as needed until that time  pending other care plans -   Length of stay, days: 11  Continue wound care and ongoing rehab efforts No medication changes recommended today (DC'd Levaquin 07/13/13 as ongoing GNR coverage for PNA with IV abx as per pharmacy)  Raenette Rover. Felicity Coyer, MD 07/14/2013, 10:04 AM

## 2013-07-14 NOTE — Progress Notes (Signed)
SUBJECTIVE:  I was called to see the patient because of hypotension and decreased urine output.  He does not report pain or SOB.  However, he is clearly more lethargic than previous.  Labs this morning demonstrated a slightly higher creat and elevated potassium   PHYSICAL EXAM Filed Vitals:   07/14/13 0951 07/14/13 1509 07/14/13 1722 07/14/13 1856  BP:  85/57 80/52 84/57   Pulse:  104 102 103  Temp:  97.6 F (36.4 C)  98.1 F (36.7 C)  TempSrc:  Oral  Oral  Resp:  20 20 20   Height:      Weight: 295 lb 3.2 oz (133.902 kg)     SpO2:  98% 100% 100%   General:  No distress but acutely ill Lungs:  Decreased breath sounds Heart:  RRR Abdomen:  Decreased breath sounds Extremities:  Severe edema  LABS: Lab Results  Component Value Date   TROPONINI <0.30 07/12/2013   Results for orders placed during the hospital encounter of 07/03/13 (from the past 24 hour(s))  VANCOMYCIN, TROUGH     Status: Abnormal   Collection Time    07/14/13  7:00 AM      Result Value Range   Vancomycin Tr 30.0 (*) 10.0 - 20.0 ug/mL  BASIC METABOLIC PANEL     Status: Abnormal   Collection Time    07/14/13  7:30 AM      Result Value Range   Sodium 128 (*) 135 - 145 mEq/L   Potassium 5.9 (*) 3.5 - 5.1 mEq/L   Chloride 97  96 - 112 mEq/L   CO2 23  19 - 32 mEq/L   Glucose, Bld 242 (*) 70 - 99 mg/dL   BUN 72 (*) 6 - 23 mg/dL   Creatinine, Ser 4.09 (*) 0.50 - 1.35 mg/dL   Calcium 7.4 (*) 8.4 - 10.5 mg/dL   GFR calc non Af Amer 53 (*) >90 mL/min   GFR calc Af Amer 62 (*) >90 mL/min  GLUCOSE, CAPILLARY     Status: Abnormal   Collection Time    07/14/13  7:30 AM      Result Value Range   Glucose-Capillary 235 (*) 70 - 99 mg/dL  GLUCOSE, CAPILLARY     Status: Abnormal   Collection Time    07/14/13 11:07 AM      Result Value Range   Glucose-Capillary 197 (*) 70 - 99 mg/dL  GLUCOSE, CAPILLARY     Status: Abnormal   Collection Time    07/14/13  4:37 PM      Result Value Range   Glucose-Capillary 254 (*)  70 - 99 mg/dL  BASIC METABOLIC PANEL     Status: Abnormal   Collection Time    07/14/13  8:00 PM      Result Value Range   Sodium 131 (*) 135 - 145 mEq/L   Potassium 5.9 (*) 3.5 - 5.1 mEq/L   Chloride 99  96 - 112 mEq/L   CO2 23  19 - 32 mEq/L   Glucose, Bld 220 (*) 70 - 99 mg/dL   BUN 75 (*) 6 - 23 mg/dL   Creatinine, Ser 8.11 (*) 0.50 - 1.35 mg/dL   Calcium 7.5 (*) 8.4 - 10.5 mg/dL   GFR calc non Af Amer 43 (*) >90 mL/min   GFR calc Af Amer 50 (*) >90 mL/min    Intake/Output Summary (Last 24 hours) at 07/14/13 2041 Last data filed at 07/14/13 1849  Gross per 24 hour  Intake  600 ml  Output    700 ml  Net   -100 ml    EKG:  NSR, rate 103, no acute ST T wave changes.  ASSESSMENT AND PLAN:  Hypotension:  Question sepsis vs cardiogenic.  Stat BMET is pending.  He is being transferred to the hospitalist service and he will be transferred to step down.  He will have fluid hydration and I have ordered low dose dopamine.  We did call his wife to update her on the situation.     Rollene Rotunda 07/14/2013 8:41 PM

## 2013-07-14 NOTE — Progress Notes (Addendum)
Notified Md about pts o2 sats and dop gtt. Titrated for SBP 88 and placed pt on VM o2 sats 70s improved to 88 then placed on NRB.  O2 sat now 95. Elink called will give a 500cc bolus and obtain a STAT PCXR and ABG.  Pt follows commands but lethargic.  Emilie [place] Liter

## 2013-07-14 NOTE — Progress Notes (Signed)
Called rapid response RN to assess pt given his low BP, increased HR, abnormal labs this morning, and overall unwell appearance. Most recent VS BP 85/57, HR 104 at rest, temp 97.6, RR 20, O2 sat 98% RA. Pt with episode of nausea today prior to lunch, IV zofran effective. Denies pain and no PRN pain med given this shift. Dressing to stump changed this morning with MD present to assess; bleeding has slowed compared to yesterday and no change in the size of the dehisced area. Reviewed chart and assessed pt with RR RN, no further recommendations made at this time other than to continue to monitor pt and call for any decline in status. RN has encouraged pt to get OOB to chair today but he has declined several times. Also declines to be repositioned onto side and off of back. Educated pt regarding risk of pressure ulcer development. I&O cath at 1200 for 300cc after 8 hours since last cath. Will continue to monitor pt closely. Mick Sell RN

## 2013-07-14 NOTE — Progress Notes (Signed)
Pt noted to be very lethargic and complaining of feeling "terrible" upon the beginning of my shift. Bolus ordered for 250 cc NS. Recheck of blood pressure after bolus was 78/46.  MD on call notified of the patients status and asked if he could have someone to see the patient and that I felt the patient was too sick to remain on rehab. Rapid response nurse called to assess. Hospitalist arrived and determined the need for patient to be transferred to a step down unit. Awaiting patients transfer to 2900. Will continue to monitor.

## 2013-07-14 NOTE — Progress Notes (Signed)
Received call from RN indicating that the patient's O2 sats are decreasing to mid 80% range and is no longer holding with a VM, being placed on 100% NRB. I have discussed the case with Dr. De Burrs of critical care who will be evaluating him as a possible MICU transfer.  Suspect sepsis in this patient at this time, possible from HCAP.  Jonah Blue, DO, FACP 07/14/2013, 10:06 PM

## 2013-07-14 NOTE — H&P (Signed)
[facility] ADMISSION H&P   Chief Complaint: Malaise, low BP, and decreased UOP   HPI: 52 yr old WM w/ a pmhx significant for HTN, CVA, ISCM, admitted with SIRS and MSSA bacteremia from gangrenous right foot, s/p BKA R in 9/13. At the time, he became hypotensive and had elevate Trop I's. He underwent LHC with findings of 3v CAD with an EF of 40%. There are plans to perform CABG by once he stabilizes. He was transferred to rehab floor and then developed increasing SOB with increased weight (10 lbs over approx one week). He received IV lasix with improvement in his SOB. He was subsequently started on Cefepime and Vancomcyn for a concern of HCAP and pharmacy has been following. His dose of Vancomycin was reduced due to worsening renal function. It is noted that the patient was on Ancef for MSSA bacteremia per ID (9/13 start date with plans to continue until 10/10) but this was discontinued due to concern for HCAP.  Today, RRT was called due to patient complaining of feeling unwell. He had a BP of 85/57. He was noted to have decreased UOP with only 100 cc in about 7 hours. He was given a NS 250 cc bolus.  BMET returned a K of 5.9. EKG was performed with no evidence of peaked T waves. He was noted to have a Cr of 1.77, increased from 1.25 on 9/27.  Past Medical History   Diagnosis  Date   .  Hypertension    .  Diabetes mellitus without complication  ~2000     last HbA1c ~9   .  Hypercholesteremia    .  CVA (cerebral infarction)  2011     R hand deficit   .  Chronic sinusitis    .  Allergic rhinitis    .  Chronic cough    .  Complication of anesthesia      couldn't swallow and talk   .  Stroke     Past Surgical History   Procedure  Laterality  Date   .  4th toe amputation  Left  Jan. 2014     4th toe in [place]   .  Tonsillectomy and adenoidectomy     .  Penile prosthesis implant     .  I&d extremity  Left  12/08/2012     Procedure: IRRIGATION AND DEBRIDEMENT EXTREMITY; Surgeon: Kathryne Hitch, MD; Location: [facility] OR; Service: Orthopedics; Laterality: Left;   .  Amputation  Left  12/08/2012     Procedure: AMPUTATION DIGIT; Surgeon: Kathryne Hitch, MD; Location: [name] OR; Service: Orthopedics; Laterality: Left; 3rd toe amputation possible 5th toe amputation   .  I&d extremity  Left  12/11/2012     Procedure: REPEAT I&D LEFT FOOT; Surgeon: Kathryne Hitch, MD; Location: [facility] OR; Service: Orthopedics; Laterality: Left;   .  Amputation  Left  12/11/2012     Procedure: Amputation of 2nd Toe; Surgeon: Kathryne Hitch, MD; Location: [facility] OR; Service: Orthopedics; Laterality: Left;   .  Bypass graft popliteal to tibial  Left  12/13/2012     Procedure: BYPASS GRAFT POPLITEAL TO TIBIAL; Surgeon: Nada Libman, MD; Location: [facility] OR; Service: Vascular; Laterality: Left; Left Popliteal to Posterior Tibial Bypass Graft with reversed saphenous vein graft   .  Incision and drainage  Right  06/25/2013     Procedure: INCISION AND DRAINAGE RIGHT FOOT; Surgeon: Kathryne Hitch, MD; Location: [facility] ORS; Service: Orthopedics; Laterality: Right;   .  Amputation  Right  06/29/2013     Procedure: AMPUTATION BELOW KNEE; Surgeon: Nadara Mustard, MD; Location: [facility] OR; Service: Orthopedics; Laterality: Right;   .  Tee without cardioversion  [name]  07/03/2013     Procedure: TRANSESOPHAGEAL ECHOCARDIOGRAM (TEE); Surgeon: Laurey Morale, MD; Location: [facility] ENDOSCOPY; Service: Cardiovascular; Laterality: [name];    Family History   Problem  Relation  Age of Onset   .  Diabetes     .  Pancreatic cancer  Mother    .  Diabetes  Mother    .  Hyperlipidemia  Mother    .  Breast cancer  Sister    .  Depression  Maternal Grandmother    .  Heart disease  Maternal Grandmother    .  Depression  Maternal Grandfather    .  Heart disease  Maternal Grandfather    .  Depression  Paternal Grandmother    .  Heart disease  Paternal Grandmother    .  Depression  Paternal Grandfather    .  Heart disease  Paternal  Grandfather     Social History: reports that he has never smoked. He has never used smokeless tobacco. He reports that he does not drink alcohol or use illicit drugs.  Allergies:  Allergies   Allergen  Reactions   .  Amoxicillin      vomiting    Medications Prior to Admission   Medication  Sig  Dispense  Refill   .  aspirin 81 MG tablet  Take 81 mg by mouth daily.     Marland Kitchen  ceFAZolin (ANCEF) 2-3 GM-% SOLR  Inject 50 mLs (2 g total) into the vein every 8 (eight) hours.  50 mL  0   .  docusate sodium 100 MG CAPS  Take 100 mg by mouth 2 (two) times daily.  10 capsule  0   .  fentaNYL (SUBLIMAZE) 0.05 MG/ML injection  Inject 1-2 mLs (50-100 mcg total) into the vein every 2 (two) hours as needed for severe pain (or RASS > 0).  2 mL  0   .  glyBURIDE-metformin (GLUCOVANCE) 5-500 MG per tablet  Take 2 tablets by mouth 2 (two) times daily.     .  insulin aspart (NOVOLOG FLEXPEN) 100 UNIT/ML injection  Inject 12 Units into the skin 3 (three) times daily. Take every time he eats per patient     .  insulin detemir (LEVEMIR) 100 UNIT/ML injection  Inject 0.25 mLs (25 Units total) into the skin every evening.  10 mL  0   .  lisinopril (PRINIVIL,ZESTRIL) 5 MG tablet  Take 1 tablet (5 mg total) by mouth daily.     .  metoCLOPramide (REGLAN) 5 MG tablet  Take 1-2 tablets (5-10 mg total) by mouth every 8 (eight) hours as needed (if ondansetron (ZOFRAN) ineffective.).     Marland Kitchen  metoCLOPramide (REGLAN) 5 MG/ML injection  Inject 1-2 mLs (5-10 mg total) into the vein every 8 (eight) hours as needed (if ondansetron (ZOFRAN) ineffective.).  2 mL  0   .  metoprolol (LOPRESSOR) 50 MG tablet  Take 1 tablet (50 mg total) by mouth 2 (two) times daily.     .  ondansetron (ZOFRAN) 8 MG tablet  Take by mouth every 8 (eight) hours as needed for nausea. For upset stomach     .  oxyCODONE (OXY IR/ROXICODONE) 5 MG immediate release tablet  Take 1 tablet (5 mg total) by mouth every 4 (four) hours as  needed.  30 tablet  0   .   pantoprazole (PROTONIX) 40 MG tablet  Take 1 tablet (40 mg total) by mouth daily.     .  pregabalin (LYRICA) 75 MG capsule  Take 75 mg by mouth 2 (two) times daily.     .  simvastatin (ZOCOR) 40 MG tablet  Take 40 mg by mouth every evening.      Results for orders placed during the hospital encounter of 07/03/13 (from the past 48 hour(s))   GLUCOSE, CAPILLARY Status: Abnormal    Collection Time    07/12/13 9:07 PM   Result  Value  Range    Glucose-Capillary  109 (*)  70 - 99 mg/dL    Comment 1  Notify RN    GLUCOSE, CAPILLARY Status: Abnormal    Collection Time    07/13/13 7:36 AM   Result  Value  Range    Glucose-Capillary  130 (*)  70 - 99 mg/dL   GLUCOSE, CAPILLARY Status: Abnormal    Collection Time    07/13/13 11:38 AM   Result  Value  Range    Glucose-Capillary  149 (*)  70 - 99 mg/dL   BASIC METABOLIC PANEL Status: Abnormal    Collection Time    07/13/13 12:50 PM   Result  Value  Range    Sodium  131 (*)  135 - 145 mEq/L    Potassium  5.0  3.5 - 5.1 mEq/L    Chloride  99  96 - 112 mEq/L    CO2  27  19 - 32 mEq/L    Glucose, Bld  135 (*)  70 - 99 mg/dL    BUN  67 (*)  6 - 23 mg/dL    Creatinine, Ser  4.54  0.50 - 1.35 mg/dL    Calcium  7.4 (*)  8.4 - 10.5 mg/dL    GFR calc non Af Amer  66 (*)  >90 mL/min    GFR calc Af Amer  76 (*)  >90 mL/min    Comment:  (NOTE)     The eGFR has been calculated using the CKD EPI equation.     This calculation has not been validated in all clinical situations.     eGFR's persistently <90 mL/min signify possible Chronic Kidney     Disease.   GLUCOSE, CAPILLARY Status: Abnormal    Collection Time    07/13/13 4:10 PM   Result  Value  Range    Glucose-Capillary  131 (*)  70 - 99 mg/dL   GLUCOSE, CAPILLARY Status: Abnormal    Collection Time    07/13/13 8:31 PM   Result  Value  Range    Glucose-Capillary  154 (*)  70 - 99 mg/dL   VANCOMYCIN, TROUGH Status: Abnormal    Collection Time    07/14/13 7:00 AM   Result  Value  Range     Vancomycin Tr  30.0 (*)  10.0 - 20.0 ug/mL    Comment:  CRITICAL RESULT CALLED TO, READ BACK BY AND VERIFIED WITH:     AMBURNSRN [id number] MCCAULEG   BASIC METABOLIC PANEL Status: Abnormal    Collection Time    07/14/13 7:30 AM   Result  Value  Range    Sodium  128 (*)  135 - 145 mEq/L    Potassium  5.9 (*)  3.5 - 5.1 mEq/L    Chloride  97  96 - 112 mEq/L    CO2  23  19 - 32 mEq/L    Glucose, Bld  242 (*)  70 - 99 mg/dL    BUN  72 (*)  6 - 23 mg/dL    Creatinine, Ser  5.40 (*)  0.50 - 1.35 mg/dL    Calcium  7.4 (*)  8.4 - 10.5 mg/dL    GFR calc non Af Amer  53 (*)  >90 mL/min    GFR calc Af Amer  62 (*)  >90 mL/min    Comment:  (NOTE)     The eGFR has been calculated using the CKD EPI equation.     This calculation has not been validated in all clinical situations.     eGFR's persistently <90 mL/min signify possible Chronic Kidney     Disease.   GLUCOSE, CAPILLARY Status: Abnormal    Collection Time    07/14/13 7:30 AM   Result  Value  Range    Glucose-Capillary  235 (*)  70 - 99 mg/dL   GLUCOSE, CAPILLARY Status: Abnormal    Collection Time    07/14/13 11:07 AM   Result  Value  Range    Glucose-Capillary  197 (*)  70 - 99 mg/dL   GLUCOSE, CAPILLARY Status: Abnormal    Collection Time    07/14/13 4:37 PM   Result  Value  Range    Glucose-Capillary  254 (*)  70 - 99 mg/dL   BASIC METABOLIC PANEL Status: Abnormal    Collection Time    07/14/13 8:00 PM   Result  Value  Range    Sodium  131 (*)  135 - 145 mEq/L    Potassium  5.9 (*)  3.5 - 5.1 mEq/L    Chloride  99  96 - 112 mEq/L    CO2  23  19 - 32 mEq/L    Glucose, Bld  220 (*)  70 - 99 mg/dL    BUN  75 (*)  6 - 23 mg/dL    Creatinine, Ser  9.81 (*)  0.50 - 1.35 mg/dL    Calcium  7.5 (*)  8.4 - 10.5 mg/dL    GFR calc non Af Amer  43 (*)  >90 mL/min    GFR calc Af Amer  50 (*)  >90 mL/min    Comment:  (NOTE)     The eGFR has been calculated using the CKD EPI equation.     This calculation has not been validated in  all clinical situations.     eGFR's persistently <90 mL/min signify possible Chronic Kidney     Disease.    No results found.  Review of Systems  Constitutional: Positive for malaise/fatigue. Negative for fever and chills.  Eyes: Negative for blurred vision and photophobia.  Respiratory: Positive for shortness of breath. Negative for cough and sputum production.  Cardiovascular: Negative for chest pain, palpitations, orthopnea and claudication.  Gastrointestinal: Positive for nausea. Negative for vomiting and abdominal pain.  Genitourinary: Negative for dysuria.  Neurological: Positive for weakness. Negative for dizziness, focal weakness, loss of consciousness and headaches.  Psychiatric/Behavioral: Negative for depression.   Blood pressure 84/57, pulse 103, temperature 98.1 F (36.7 C), temperature source Oral, resp. rate 20, height 5' 10.87" (1.8 m), weight 295 lb 3.2 oz (133.902 kg), SpO2 100.00%.  Physical Exam  Constitutional: He is oriented to person, place, and time. He appears well-developed. No distress.  HENT:  Head: Normocephalic and atraumatic.  Eyes: Conjunctivae and EOM are normal. Pupils are equal, round, and reactive to light.  Neck: Normal range of motion. No JVD present. No thyromegaly present.  Cardiovascular: Normal rate and regular rhythm. Exam reveals no gallop and no friction rub.  No murmur heard.  Respiratory: Effort normal. No respiratory distress. He has no wheezes.  GI: Soft. He exhibits no distension. There is no rebound.  Musculoskeletal: He exhibits edema.  Legs: Neurological: He is alert and oriented to person, place, and time. No cranial nerve deficit.  Skin: Skin is warm. He is not diaphoretic.  Psychiatric: He has a normal mood and affect.   Assessment/Plan  52 yr old WM w/ a pmhx significant for HTN, CVA, ISCM, admitted with SIRS and MSSA bacteremia from gangrenous right foot, s/p BKA R in 9/13, with hypotension and AKI.  1) AKI: DDx includes  pre-renal state progressing to ATN, Vancomycin toxicity, post-renal failure. Renal U/S ordered. BMET repeat in AM. No EKG changes from K of 5.9. I will start NS at 100 cc/hr given low EF. UA ordered. Would hold further doses of Vancomycin at this time (pharmacy following). Cardiology will order low dose dopamine.  2) HCAP?: On cefepime and Vanc. Would recommend narrowing regimen, he has no productive cough or fever. Obtain CBC.  3) Hypotension: I suspect he may be dry, but bacteremia and sepsis is a concern. Will order [facility] again. Cont IVF.  3) HTN: Hold parameters on BP meds.  4) Proph: heparin.  5) FEN: IVF, repeat BMET in AM.  6) Code: FULL   Jonah Blue, DO, FACP  07/14/2013, 9:08 PM

## 2013-07-14 NOTE — Progress Notes (Signed)
At 1722 BP 80/52, HR 102. Lasix 40mg  IV due at 1800. Called cardiologist Dr. Antoine Poche who ordered to hold dose. Rapid response returned to unit to reassess pt at 1830. At this time, I&O cathed pt for 100cc, 6.5 hours since last I&O cath. Called rehab MD on call (Dr. Amador Cunas) to inform him of pts status- low BP, low urine output, malaise, etc. Advised to contact Dr. Antoine Poche regarding issues, who ordered 250cc NS bolus and stat BMET. At this time, report given to oncoming RN who will continue plan of care for pt. Mick Sell RN

## 2013-07-14 NOTE — Progress Notes (Signed)
ANTIBIOTIC CONSULT NOTE - FOLLOW UP  Pharmacy Consult for Vancomycin + Cefepime Indication: rule out pneumonia  Allergies  Allergen Reactions  . Amoxicillin     vomiting    Patient Measurements: Height: 5' 10.87" (180 cm) Weight: 295 lb 3.2 oz (133.902 kg) IBW/kg (Calculated) : 74.99  Vital Signs: Temp: 97.3 F (36.3 C) (09/28 0500) Temp src: Oral (09/28 0500) BP: 95/65 mmHg (09/28 0846) Pulse Rate: 100 (09/28 0500) Intake/Output from previous day: 09/27 0701 - 09/28 0700 In: 480 [P.O.:480] Out: 1125 [Urine:1125] Intake/Output from this shift: Total I/O In: 240 [P.O.:240] Out: -   Labs:  Recent Labs  07/12/13 0510 07/13/13 1250 07/14/13 0730  CREATININE 1.28 1.25 1.48*   Estimated Creatinine Clearance: 83.3 ml/min (by C-G formula based on Cr of 1.48).  Recent Labs  07/14/13 0700  VANCOTROUGH 30.0*     Microbiology: Recent Results (from the past 720 hour(s))  CULTURE, BLOOD (ROUTINE X 2)     Status: None   Collection Time    06/26/13  1:00 AM      Result Value Range Status   Specimen Description BLOOD RIGHT ARM   Final   Special Requests     Final   Value: BOTTLES DRAWN AEROBIC AND ANAEROBIC  ,   Culture  Setup Time     Final   Value: 06/26/2013 04:11     Performed at [facility]   Culture     Final   Value: STAPHYLOCOCCUS AUREUS     Note: SUSCEPTIBILITIES PERFORMED ON PREVIOUS CULTURE WITHIN THE LAST 5 DAYS.     Note: Gram Stain Report Called to,Read Back By and Verified With: STACY PHELPS 06/28/13 1155 BY SMITHERSJ     Performed at [facility]   Report Status 06/30/2013 FINAL   Final  CULTURE, BLOOD (ROUTINE X 2)     Status: None   Collection Time    06/26/13  1:08 AM      Result Value Range Status   Specimen Description BLOOD RIGHT ARM   Final   Special Requests BOTTLES DRAWN AEROBIC AND ANAEROBIC    Final   Culture  Setup Time     Final   Value: 06/26/2013 04:10     Performed at [facility]   Culture     Final   Value: STAPHYLOCOCCUS AUREUS     Note: RIFAMPIN AND GENTAMICIN SHOULD NOT BE USED AS SINGLE DRUGS FOR TREATMENT OF STAPH INFECTIONS.     Note: Gram Stain Report Called to,Read Back By and Verified With: JOHN HUNT ON 06/28/2013 AT 8:21P BY Serafina Mitchell     Performed at [facility]   Report Status 06/30/2013 FINAL   Final   Organism ID, Bacteria STAPHYLOCOCCUS AUREUS   Final  MRSA PCR SCREENING     Status: None   Collection Time    06/26/13  8:08 AM      Result Value Range Status   MRSA by PCR NEGATIVE  NEGATIVE Final   Comment:            The GeneXpert MRSA Assay (FDA     approved for NASAL specimens     only), is one component of a     comprehensive MRSA colonization     surveillance program. It is not     intended to diagnose MRSA     infection nor to guide or     monitor treatment for     MRSA infections.  URINE CULTURE  Status: None   Collection Time    06/28/13  6:05 PM      Result Value Range Status   Specimen Description URINE, CATHETERIZED   Final   Special Requests Normal   Final   Culture  Setup Time     Final   Value: 06/28/2013 18:30     Performed at [facility] Count     Final   Value: NO GROWTH     Performed at [facility]   Culture     Final   Value: NO GROWTH     Performed at [facility]   Report Status 06/30/2013 FINAL   Final  CULTURE, BLOOD (ROUTINE X 2)     Status: None   Collection Time    06/29/13  3:10 PM      Result Value Range Status   Specimen Description BLOOD RIGHT ANTECUBITAL   Final   Special Requests BOTTLES DRAWN AEROBIC AND ANAEROBIC 10CC   Final   Culture  Setup Time     Final   Value: 06/29/2013 21:30     Performed at [facility]   Culture     Final   Value: NO GROWTH 5 DAYS     Performed at [facility]   Report Status 07/05/2013 FINAL   Final  CULTURE, BLOOD (ROUTINE X 2)     Status: None   Collection Time    06/29/13  3:20 PM      Result Value Range  Status   Specimen Description BLOOD RIGHT HAND   Final   Special Requests BOTTLES DRAWN AEROBIC AND ANAEROBIC 10CC   Final   Culture  Setup Time     Final   Value: 06/29/2013 21:31     Performed at [facility]   Culture     Final   Value: NO GROWTH 5 DAYS     Performed at [facility]   Report Status 07/05/2013 FINAL   Final  URINE CULTURE     Status: None   Collection Time    07/11/13 12:46 PM      Result Value Range Status   Specimen Description URINE, CATHETERIZED   Final   Special Requests NONE   Final   Culture  Setup Time     Final   Value: 07/11/2013 13:36     Performed at [facility] Count     Final   Value: NO GROWTH     Performed at [facility]   Culture     Final   Value: NO GROWTH     Performed at [facility]   Report Status 07/12/2013 FINAL   Final    Anti-infectives   Start     Dose/Rate Route Frequency Ordered Stop   07/14/13 1800  vancomycin (VANCOCIN) IVPB 750 mg/150 ml premix     750 mg 150 mL/hr over 60 Minutes Intravenous Every 12 hours 07/14/13 1253     07/12/13 1800  ceFEPIme (MAXIPIME) 1 g in dextrose 5 % 50 mL IVPB     1 g 100 mL/hr over 30 Minutes Intravenous Every 8 hours 07/12/13 1335     07/12/13 1000  cefTAZidime (FORTAZ) 1 g in dextrose 5 % 50 mL IVPB  Status:  Discontinued     1 g 100 mL/hr over 30 Minutes Intravenous Every 8 hours 07/12/13 0855 07/12/13 1335   07/12/13 0800  vancomycin (VANCOCIN) 1,250 mg in  sodium chloride 0.9 % 250 mL IVPB  Status:  Discontinued     1,250 mg 166.7 mL/hr over 90 Minutes Intravenous Every 12 hours 07/11/13 1741 07/14/13 1253   07/11/13 1845  vancomycin (VANCOCIN) 2,000 mg in sodium chloride 0.9 % 500 mL IVPB     2,000 mg 250 mL/hr over 120 Minutes Intravenous  Once 07/11/13 1741 07/11/13 2249   07/11/13 1830  piperacillin-tazobactam (ZOSYN) IVPB 3.375 g  Status:  Discontinued     3.375 g 12.5 mL/hr over 240 Minutes Intravenous 3 times per day 07/11/13  1730 07/12/13 0855   07/11/13 1100  levofloxacin (LEVAQUIN) tablet 500 mg  Status:  Discontinued     500 mg Oral Daily 07/11/13 0939 07/13/13 1058   07/03/13 2200  ceFAZolin (ANCEF) IVPB 2 g/50 mL premix  Status:  Discontinued     2 g 100 mL/hr over 30 Minutes Intravenous 3 times per day 07/03/13 1748 07/11/13 1631      Assessment: 52 y.o. F who continues on Vancomycin + Cefepime for empiric PNA coverage with a SUPRAtherapeutic Vancomycin level this morning however doses were off-schedule and drawn a little early (drawn Vancomycin level ~30 mcg/ml, estimated true trough closer to 25 mcg/ml). Renal function has also worsened, SCr 1.48 << 1.25, CrCl~60 ml/min (normalized). Will reduce Vancomycin dose today.  It is noted that this patient was on Ancef for MSSA bacteremia per ID -- treatment was started on 9/13 and plans were to continue for 4 weeks (thru 10/10). Ancef was d/ced to broaden to Vancomycin/Cefepime for PNA -- once treatment for PNA completed (recommended 7-8 days), the patient will need their Ancef resumed.  Goal of Therapy:  Vancomycin trough level 15-20 mcg/ml Proper antibiotics for infection/cultures adjusted for renal/hepatic function   Plan:  1. Reduce Vancomycin to 750 mg IV every 12 hours (next dose due at 1800 on 9/28) 2. Continue Cefepime 1g IV every 8 hours 3. Will continue to follow renal function, culture results, LOT, and antibiotic de-escalation plans   Georgina Pillion, PharmD, BCPS Clinical Pharmacist Pager: [phone] 07/14/2013 12:59 PM

## 2013-07-14 NOTE — H&P (Signed)
[facility] ADMISSION H&P   Chief Complaint: Malaise, low BP, and decreased UOP   HPI: 52 yr old WM w/ a pmhx significant for HTN, CVA, ISCM, admitted with SIRS and MSSA bacteremia from gangrenous right foot, s/p BKA R in 9/13. At the time, he became hypotensive and had elevate Trop I's. He underwent LHC with findings of 3v CAD with an EF of 40%. There are plans to perform CABG by once he stabilizes. He was transferred to rehab floor and then developed increasing SOB with increased weight (10 lbs over approx one week). He received IV lasix with improvement in his SOB. He was subsequently started on Cefepime and Vancomcyn for a concern of HCAP and pharmacy has been following. His dose of Vancomycin was reduced due to worsening renal function. It is noted that the patient was on Ancef for MSSA bacteremia per ID (9/13 start date with plans to continue until 10/10) but this was discontinued due to concern for HCAP.  Today, RRT was called due to patient complaining of feeling unwell. He had a BP of 85/57. He was noted to have decreased UOP with only 100 cc in about 7 hours. He was given a NS 250 cc bolus.  BMET returned a K of 5.9. EKG was performed with no evidence of peaked T waves. He was noted to have a Cr of 1.77, increased from 1.25 on 9/27.  Past Medical History   Diagnosis  Date   .  Hypertension    .  Diabetes mellitus without complication  ~2000     last HbA1c ~9   .  Hypercholesteremia    .  CVA (cerebral infarction)  2011     R hand deficit   .  Chronic sinusitis    .  Allergic rhinitis    .  Chronic cough    .  Complication of anesthesia      couldn't swallow and talk   .  Stroke     Past Surgical History   Procedure  Laterality  Date   .  4th toe amputation  Left  Jan. 2014     4th toe in [place]   .  Tonsillectomy and adenoidectomy     .  Penile prosthesis implant     .  I&d extremity  Left  12/08/2012     Procedure: IRRIGATION AND DEBRIDEMENT EXTREMITY; Surgeon: Christopher  Y Blackman, MD; Location: [facility] OR; Service: Orthopedics; Laterality: Left;   .  Amputation  Left  12/08/2012     Procedure: AMPUTATION DIGIT; Surgeon: Christopher Y Blackman, MD; Location: [facility]; Service: Orthopedics; Laterality: Left; 3rd toe amputation possible 5th toe amputation   .  I&d extremity  Left  12/11/2012     Procedure: REPEAT I&D LEFT FOOT; Surgeon: Christopher Y Blackman, MD; Location: [facility]; Service: Orthopedics; Laterality: Left;   .  Amputation  Left  12/11/2012     Procedure: Amputation of 2nd Toe; Surgeon: Christopher Y Blackman, MD; Location: [facility]; Service: Orthopedics; Laterality: Left;   .  Bypass graft popliteal to tibial  Left  12/13/2012     Procedure: BYPASS GRAFT POPLITEAL TO TIBIAL; Surgeon: Vance W Brabham, MD; Location: [facility] OR; Service: Vascular; Laterality: Left; Left Popliteal to Posterior Tibial Bypass Graft with reversed saphenous vein graft   .  Incision and drainage  Right  06/25/2013     Procedure: INCISION AND DRAINAGE RIGHT FOOT; Surgeon: Christopher Y Blackman, MD; Location: [facility]; Service: Orthopedics; Laterality: Right;   .    Amputation  Right  06/29/2013     Procedure: AMPUTATION BELOW KNEE; Surgeon: Marcus V Duda, MD; Location: [facility]; Service: Orthopedics; Laterality: Right;   .  Tee without cardioversion  [name]  07/03/2013     Procedure: TRANSESOPHAGEAL ECHOCARDIOGRAM (TEE); Surgeon: Dalton S McLean, MD; Location: [facility]; Service: Cardiovascular; Laterality: [name];    Family History   Problem  Relation  Age of Onset   .  Diabetes     .  Pancreatic cancer  Mother    .  Diabetes  Mother    .  Hyperlipidemia  Mother    .  Breast cancer  Sister    .  Depression  Maternal Grandmother    .  Heart disease  Maternal Grandmother    .  Depression  Maternal Grandfather    .  Heart disease  Maternal Grandfather    .  Depression  Paternal Grandmother    .  Heart disease  Paternal Grandmother    .  Depression  Paternal Grandfather    .  Heart disease  Paternal  Grandfather     Social History: reports that he has never smoked. He has never used smokeless tobacco. He reports that he does not drink alcohol or use illicit drugs.  Allergies:  Allergies   Allergen  Reactions   .  Amoxicillin      vomiting    Medications Prior to Admission   Medication  Sig  Dispense  Refill   .  aspirin 81 MG tablet  Take 81 mg by mouth daily.     .  ceFAZolin (ANCEF) 2-3 GM-% SOLR  Inject 50 mLs (2 g total) into the vein every 8 (eight) hours.  50 mL  0   .  docusate sodium 100 MG CAPS  Take 100 mg by mouth 2 (two) times daily.  10 capsule  0   .  fentaNYL (SUBLIMAZE) 0.05 MG/ML injection  Inject 1-2 mLs (50-100 mcg total) into the vein every 2 (two) hours as needed for severe pain (or RASS > 0).  2 mL  0   .  glyBURIDE-metformin (GLUCOVANCE) 5-500 MG per tablet  Take 2 tablets by mouth 2 (two) times daily.     .  insulin aspart (NOVOLOG FLEXPEN) 100 UNIT/ML injection  Inject 12 Units into the skin 3 (three) times daily. Take every time he eats per patient     .  insulin detemir (LEVEMIR) 100 UNIT/ML injection  Inject 0.25 mLs (25 Units total) into the skin every evening.  10 mL  0   .  lisinopril (PRINIVIL,ZESTRIL) 5 MG tablet  Take 1 tablet (5 mg total) by mouth daily.     .  metoCLOPramide (REGLAN) 5 MG tablet  Take 1-2 tablets (5-10 mg total) by mouth every 8 (eight) hours as needed (if ondansetron (ZOFRAN) ineffective.).     .  metoCLOPramide (REGLAN) 5 MG/ML injection  Inject 1-2 mLs (5-10 mg total) into the vein every 8 (eight) hours as needed (if ondansetron (ZOFRAN) ineffective.).  2 mL  0   .  metoprolol (LOPRESSOR) 50 MG tablet  Take 1 tablet (50 mg total) by mouth 2 (two) times daily.     .  ondansetron (ZOFRAN) 8 MG tablet  Take by mouth every 8 (eight) hours as needed for nausea. For upset stomach     .  oxyCODONE (OXY IR/ROXICODONE) 5 MG immediate release tablet  Take 1 tablet (5 mg total) by mouth every 4 (four) hours as   needed.  30 tablet  0   .   pantoprazole (PROTONIX) 40 MG tablet  Take 1 tablet (40 mg total) by mouth daily.     .  pregabalin (LYRICA) 75 MG capsule  Take 75 mg by mouth 2 (two) times daily.     .  simvastatin (ZOCOR) 40 MG tablet  Take 40 mg by mouth every evening.      Results for orders placed during the hospital encounter of 07/03/13 (from the past 48 hour(s))   GLUCOSE, CAPILLARY Status: Abnormal    Collection Time    07/12/13 9:07 PM   Result  Value  Range    Glucose-Capillary  109 (*)  70 - 99 mg/dL    Comment 1  Notify RN    GLUCOSE, CAPILLARY Status: Abnormal    Collection Time    07/13/13 7:36 AM   Result  Value  Range    Glucose-Capillary  130 (*)  70 - 99 mg/dL   GLUCOSE, CAPILLARY Status: Abnormal    Collection Time    07/13/13 11:38 AM   Result  Value  Range    Glucose-Capillary  149 (*)  70 - 99 mg/dL   BASIC METABOLIC PANEL Status: Abnormal    Collection Time    07/13/13 12:50 PM   Result  Value  Range    Sodium  131 (*)  135 - 145 mEq/L    Potassium  5.0  3.5 - 5.1 mEq/L    Chloride  99  96 - 112 mEq/L    CO2  27  19 - 32 mEq/L    Glucose, Bld  135 (*)  70 - 99 mg/dL    BUN  67 (*)  6 - 23 mg/dL    Creatinine, Ser  1.25  0.50 - 1.35 mg/dL    Calcium  7.4 (*)  8.4 - 10.5 mg/dL    GFR calc non Af Amer  66 (*)  >90 mL/min    GFR calc Af Amer  76 (*)  >90 mL/min    Comment:  (NOTE)     The eGFR has been calculated using the CKD EPI equation.     This calculation has not been validated in all clinical situations.     eGFR's persistently <90 mL/min signify possible Chronic Kidney     Disease.   GLUCOSE, CAPILLARY Status: Abnormal    Collection Time    07/13/13 4:10 PM   Result  Value  Range    Glucose-Capillary  131 (*)  70 - 99 mg/dL   GLUCOSE, CAPILLARY Status: Abnormal    Collection Time    07/13/13 8:31 PM   Result  Value  Range    Glucose-Capillary  154 (*)  70 - 99 mg/dL   VANCOMYCIN, TROUGH Status: Abnormal    Collection Time    07/14/13 7:00 AM   Result  Value  Range     Vancomycin Tr  30.0 (*)  10.0 - 20.0 ug/mL    Comment:  CRITICAL RESULT CALLED TO, READ BACK BY AND VERIFIED WITH:     AMBURNSRN [id number] MCCAULEG   BASIC METABOLIC PANEL Status: Abnormal    Collection Time    07/14/13 7:30 AM   Result  Value  Range    Sodium  128 (*)  135 - 145 mEq/L    Potassium  5.9 (*)  3.5 - 5.1 mEq/L    Chloride  97  96 - 112 mEq/L    CO2  23    19 - 32 mEq/L    Glucose, Bld  242 (*)  70 - 99 mg/dL    BUN  72 (*)  6 - 23 mg/dL    Creatinine, Ser  1.48 (*)  0.50 - 1.35 mg/dL    Calcium  7.4 (*)  8.4 - 10.5 mg/dL    GFR calc non Af Amer  53 (*)  >90 mL/min    GFR calc Af Amer  62 (*)  >90 mL/min    Comment:  (NOTE)     The eGFR has been calculated using the CKD EPI equation.     This calculation has not been validated in all clinical situations.     eGFR's persistently <90 mL/min signify possible Chronic Kidney     Disease.   GLUCOSE, CAPILLARY Status: Abnormal    Collection Time    07/14/13 7:30 AM   Result  Value  Range    Glucose-Capillary  235 (*)  70 - 99 mg/dL   GLUCOSE, CAPILLARY Status: Abnormal    Collection Time    07/14/13 11:07 AM   Result  Value  Range    Glucose-Capillary  197 (*)  70 - 99 mg/dL   GLUCOSE, CAPILLARY Status: Abnormal    Collection Time    07/14/13 4:37 PM   Result  Value  Range    Glucose-Capillary  254 (*)  70 - 99 mg/dL   BASIC METABOLIC PANEL Status: Abnormal    Collection Time    07/14/13 8:00 PM   Result  Value  Range    Sodium  131 (*)  135 - 145 mEq/L    Potassium  5.9 (*)  3.5 - 5.1 mEq/L    Chloride  99  96 - 112 mEq/L    CO2  23  19 - 32 mEq/L    Glucose, Bld  220 (*)  70 - 99 mg/dL    BUN  75 (*)  6 - 23 mg/dL    Creatinine, Ser  1.77 (*)  0.50 - 1.35 mg/dL    Calcium  7.5 (*)  8.4 - 10.5 mg/dL    GFR calc non Af Amer  43 (*)  >90 mL/min    GFR calc Af Amer  50 (*)  >90 mL/min    Comment:  (NOTE)     The eGFR has been calculated using the CKD EPI equation.     This calculation has not been validated in  all clinical situations.     eGFR's persistently <90 mL/min signify possible Chronic Kidney     Disease.    No results found.  Review of Systems  Constitutional: Positive for malaise/fatigue. Negative for fever and chills.  Eyes: Negative for blurred vision and photophobia.  Respiratory: Positive for shortness of breath. Negative for cough and sputum production.  Cardiovascular: Negative for chest pain, palpitations, orthopnea and claudication.  Gastrointestinal: Positive for nausea. Negative for vomiting and abdominal pain.  Genitourinary: Negative for dysuria.  Neurological: Positive for weakness. Negative for dizziness, focal weakness, loss of consciousness and headaches.  Psychiatric/Behavioral: Negative for depression.   Blood pressure 84/57, pulse 103, temperature 98.1 F (36.7 C), temperature source Oral, resp. rate 20, height 5' 10.87" (1.8 m), weight 295 lb 3.2 oz (133.902 kg), SpO2 100.00%.  Physical Exam  Constitutional: He is oriented to person, place, and time. He appears well-developed. No distress.  HENT:  Head: Normocephalic and atraumatic.  Eyes: Conjunctivae and EOM are normal. Pupils are equal, round, and reactive to light.    Neck: Normal range of motion. No JVD present. No thyromegaly present.  Cardiovascular: Normal rate and regular rhythm. Exam reveals no gallop and no friction rub.  No murmur heard.  Respiratory: Effort normal. No respiratory distress. He has no wheezes.  GI: Soft. He exhibits no distension. There is no rebound.  Musculoskeletal: He exhibits edema.  Legs: Neurological: He is alert and oriented to person, place, and time. No cranial nerve deficit.  Skin: Skin is warm. He is not diaphoretic.  Psychiatric: He has a normal mood and affect.   Assessment/Plan  52 yr old WM w/ a pmhx significant for HTN, CVA, ISCM, admitted with SIRS and MSSA bacteremia from gangrenous right foot, s/p BKA R in 9/13, with hypotension and AKI.  1) AKI: DDx includes  pre-renal state progressing to ATN, Vancomycin toxicity, post-renal failure. Renal U/S ordered. BMET repeat in AM. No EKG changes from K of 5.9. I will start NS at 100 cc/hr given low EF. UA ordered. Would hold further doses of Vancomycin at this time (pharmacy following). Cardiology will order low dose dopamine.  2) HCAP?: On cefepime and Vanc. Would recommend narrowing regimen, he has no productive cough or fever. Obtain CBC.  3) Hypotension: I suspect he may be dry, but bacteremia and sepsis is a concern. Will order BC again. Cont IVF.  3) HTN: Hold parameters on BP meds.  4) Proph: heparin.  5) FEN: IVF, repeat BMET in AM.  6) Code: FULL   Analena Gama, DO, FACP  07/14/2013, 9:08 PM  

## 2013-07-14 NOTE — Progress Notes (Signed)
CRITICAL VALUE ALERT  Critical value received:  Vancomycin trough 30.0  Date of notification:  07/14/2013  Time of notification:  0759  Critical value read back:yes  Nurse who received alert:  Mick Sell RN  MD notified (1st page):  Dr. Amador Cunas  Time of first page:  0803  MD notified (2nd page):  Time of second page:  Responding MD:  Dr. Amador Cunas  Time MD responded:  [phone]

## 2013-07-14 NOTE — Progress Notes (Signed)
Pt report called to [facility] on 2900. Began to transfer and patient started to vomit in the hallway. Zofran 4mg  IV given and patient transferred to 2900. Wife called and is aware of patients status and transfer. Patient discharged from rehab.

## 2013-07-14 NOTE — Progress Notes (Signed)
Patient ID: Albert [name], male   DOB: 1961-10-12, 52 y.o.   MRN: [id number] Dressing changed, 2 staples in the lateral aspect of incision where wound partially dehisced due to his fall onto stump is 2 by 1cm.   Has been open  Approx.  30 hrs   No active drainage. Knee ROM unchanged no hemarthrosis and knee ligament exam is good.     Dressing changes as needed should granulate in and not require repeat surgery. Discussed with patient. Will have Dr. Lajoyce Corners check him Monday or Tuesday. thanks

## 2013-07-14 NOTE — Progress Notes (Signed)
    SUBJECTIVE:  No leg pain today.  No acute SOB.    PHYSICAL EXAM Filed Vitals:   07/13/13 2032 07/14/13 0102 07/14/13 0500 07/14/13 0520  BP: 93/61 89/59 95/65    Pulse: 86 93 100   Temp: 97.4 F (36.3 C) 97.5 F (36.4 C) 97.3 F (36.3 C)   TempSrc: Oral Oral Oral   Resp: 18 19 18    Height:      Weight:      SpO2: 97% 97% 95% 95%   General:  No distress Lungs:  Clear Heart:  RRR Abdomen:  Positive bowel sounds, no rebound no guarding Extremities:  Severe edema (anasarca)  LABS: Lab Results  Component Value Date   TROPONINI <0.30 07/12/2013   Results for orders placed during the hospital encounter of 07/03/13 (from the past 24 hour(s))  GLUCOSE, CAPILLARY     Status: Abnormal   Collection Time    07/13/13 11:38 AM      Result Value Range   Glucose-Capillary 149 (*) 70 - 99 mg/dL  BASIC METABOLIC PANEL     Status: Abnormal   Collection Time    07/13/13 12:50 PM      Result Value Range   Sodium 131 (*) 135 - 145 mEq/L   Potassium 5.0  3.5 - 5.1 mEq/L   Chloride 99  96 - 112 mEq/L   CO2 27  19 - 32 mEq/L   Glucose, Bld 135 (*) 70 - 99 mg/dL   BUN 67 (*) 6 - 23 mg/dL   Creatinine, Ser 1.61  0.50 - 1.35 mg/dL   Calcium 7.4 (*) 8.4 - 10.5 mg/dL   GFR calc non Af Amer 66 (*) >90 mL/min   GFR calc Af Amer 76 (*) >90 mL/min  GLUCOSE, CAPILLARY     Status: Abnormal   Collection Time    07/13/13  4:10 PM      Result Value Range   Glucose-Capillary 131 (*) 70 - 99 mg/dL  GLUCOSE, CAPILLARY     Status: Abnormal   Collection Time    07/13/13  8:31 PM      Result Value Range   Glucose-Capillary 154 (*) 70 - 99 mg/dL  VANCOMYCIN, TROUGH     Status: Abnormal   Collection Time    07/14/13  7:00 AM      Result Value Range   Vancomycin Tr 30.0 (*) 10.0 - 20.0 ug/mL  GLUCOSE, CAPILLARY     Status: Abnormal   Collection Time    07/14/13  7:30 AM      Result Value Range   Glucose-Capillary 235 (*) 70 - 99 mg/dL    Intake/Output Summary (Last 24 hours) at 07/14/13  0909 Last data filed at 07/14/13 0400  Gross per 24 hour  Intake    240 ml  Output   1125 ml  Net   -885 ml    ASSESSMENT AND PLAN:  Acute systolic CHF:   UO yesterday down.   Strict I/Os and daily weights. Sodium and fluid restriction.  No lab results back yet.  His Lasix was not ordered yesterday and I will correct this.    CAD-   Asymptomatic. Continue ASA, nitrates, and beta blocker.  (Note two doses of Coreg held secondary to low BP.)  Revascularization with CABG in the future once infection cleared and patient recovered from recent surgery.   MSSA bacteremia. TEE negative for vegetation.    Fayrene Fearing [facility] 07/14/2013 9:09 AM

## 2013-07-15 ENCOUNTER — Other Ambulatory Visit: Payer: Self-pay

## 2013-07-15 ENCOUNTER — Inpatient Hospital Stay (HOSPITAL_COMMUNITY): Payer: 59

## 2013-07-15 ENCOUNTER — Ambulatory Visit: Payer: 59 | Admitting: Surgery

## 2013-07-15 ENCOUNTER — Encounter (HOSPITAL_COMMUNITY): Payer: Self-pay | Admitting: *Deleted

## 2013-07-15 ENCOUNTER — Encounter (HOSPITAL_COMMUNITY): Payer: 59

## 2013-07-15 ENCOUNTER — Inpatient Hospital Stay (HOSPITAL_COMMUNITY): Payer: 59 | Admitting: *Deleted

## 2013-07-15 DIAGNOSIS — D649 Anemia, unspecified: Secondary | ICD-10-CM

## 2013-07-15 DIAGNOSIS — N179 Acute kidney failure, unspecified: Secondary | ICD-10-CM

## 2013-07-15 DIAGNOSIS — I214 Non-ST elevation (NSTEMI) myocardial infarction: Secondary | ICD-10-CM

## 2013-07-15 DIAGNOSIS — J9600 Acute respiratory failure, unspecified whether with hypoxia or hypercapnia: Secondary | ICD-10-CM

## 2013-07-15 DIAGNOSIS — E871 Hypo-osmolality and hyponatremia: Secondary | ICD-10-CM

## 2013-07-15 DIAGNOSIS — R7881 Bacteremia: Secondary | ICD-10-CM

## 2013-07-15 DIAGNOSIS — R34 Anuria and oliguria: Secondary | ICD-10-CM

## 2013-07-15 DIAGNOSIS — E878 Other disorders of electrolyte and fluid balance, not elsewhere classified: Secondary | ICD-10-CM

## 2013-07-15 DIAGNOSIS — I340 Nonrheumatic mitral (valve) insufficiency: Secondary | ICD-10-CM

## 2013-07-15 DIAGNOSIS — R579 Shock, unspecified: Secondary | ICD-10-CM

## 2013-07-15 DIAGNOSIS — E875 Hyperkalemia: Secondary | ICD-10-CM

## 2013-07-15 DIAGNOSIS — I319 Disease of pericardium, unspecified: Secondary | ICD-10-CM

## 2013-07-15 LAB — CBC WITH DIFFERENTIAL/PLATELET
Basophils Absolute: 0 10*3/uL (ref 0.0–0.1)
Basophils Relative: 0 % (ref 0–1)
Eosinophils Absolute: 0 10*3/uL (ref 0.0–0.7)
Eosinophils Relative: 0 % (ref 0–5)
HCT: 18.2 % — ABNORMAL LOW (ref 39.0–52.0)
Hemoglobin: 5.7 g/dL — CL (ref 13.0–17.0)
Lymphocytes Relative: 16 % (ref 12–46)
Lymphs Abs: 2.3 10*3/uL (ref 0.7–4.0)
MCH: 25.8 pg — ABNORMAL LOW (ref 26.0–34.0)
MCHC: 31.3 g/dL (ref 30.0–36.0)
MCV: 82.4 fL (ref 78.0–100.0)
Monocytes Absolute: 1.3 10*3/uL — ABNORMAL HIGH (ref 0.1–1.0)
Monocytes Relative: 9 % (ref 3–12)
Neutro Abs: 11.1 10*3/uL — ABNORMAL HIGH (ref 1.7–7.7)
Neutrophils Relative %: 76 % (ref 43–77)
Platelets: 363 10*3/uL (ref 150–400)
RBC: 2.21 MIL/uL — ABNORMAL LOW (ref 4.22–5.81)
RDW: 15.1 % (ref 11.5–15.5)
WBC: 14.7 10*3/uL — ABNORMAL HIGH (ref 4.0–10.5)

## 2013-07-15 LAB — BASIC METABOLIC PANEL WITH GFR
BUN: 81 mg/dL — ABNORMAL HIGH (ref 6–23)
CO2: 17 meq/L — ABNORMAL LOW (ref 19–32)
Calcium: 7.9 mg/dL — ABNORMAL LOW (ref 8.4–10.5)
Chloride: 94 meq/L — ABNORMAL LOW (ref 96–112)
Creatinine, Ser: 1.84 mg/dL — ABNORMAL HIGH (ref 0.50–1.35)
GFR calc Af Amer: 48 mL/min — ABNORMAL LOW
GFR calc non Af Amer: 41 mL/min — ABNORMAL LOW
Glucose, Bld: 273 mg/dL — ABNORMAL HIGH (ref 70–99)
Potassium: 7.5 meq/L (ref 3.5–5.1)
Sodium: 127 meq/L — ABNORMAL LOW (ref 135–145)

## 2013-07-15 LAB — CBC
HCT: 22.5 % — ABNORMAL LOW (ref 39.0–52.0)
HCT: 23 % — ABNORMAL LOW (ref 39.0–52.0)
Hemoglobin: 7.6 g/dL — ABNORMAL LOW (ref 13.0–17.0)
Hemoglobin: 7.7 g/dL — ABNORMAL LOW (ref 13.0–17.0)
MCH: 27.4 pg (ref 26.0–34.0)
MCHC: 33.5 g/dL (ref 30.0–36.0)
MCV: 81.2 fL (ref 78.0–100.0)
Platelets: 217 10*3/uL (ref 150–400)
RBC: 2.77 MIL/uL — ABNORMAL LOW (ref 4.22–5.81)
WBC: 14.2 10*3/uL — ABNORMAL HIGH (ref 4.0–10.5)

## 2013-07-15 LAB — RENAL FUNCTION PANEL
Albumin: 2.2 g/dL — ABNORMAL LOW (ref 3.5–5.2)
CO2: 22 mEq/L (ref 19–32)
Chloride: 100 mEq/L (ref 96–112)
Creatinine, Ser: 1.96 mg/dL — ABNORMAL HIGH (ref 0.50–1.35)
GFR calc Af Amer: 44 mL/min — ABNORMAL LOW (ref >90)
GFR calc non Af Amer: 38 mL/min — ABNORMAL LOW (ref >90)
Potassium: 5.8 mEq/L — ABNORMAL HIGH (ref 3.5–5.1)

## 2013-07-15 LAB — URINALYSIS, ROUTINE W REFLEX MICROSCOPIC
Bilirubin Urine: NEGATIVE
Glucose, UA: NEGATIVE mg/dL
Hgb urine dipstick: NEGATIVE
Ketones, ur: 15 mg/dL — AB
Nitrite: NEGATIVE
Protein, ur: NEGATIVE mg/dL
Specific Gravity, Urine: 1.019 (ref 1.005–1.030)
Urobilinogen, UA: 0.2 mg/dL (ref 0.0–1.0)
pH: 5 (ref 5.0–8.0)

## 2013-07-15 LAB — URINE MICROSCOPIC-ADD ON

## 2013-07-15 LAB — CK TOTAL AND CKMB (NOT AT ARMC)
CK, MB: 10 ng/mL (ref 0.3–4.0)
Relative Index: 3.5 — ABNORMAL HIGH (ref 0.0–2.5)
Total CK: 288 U/L — ABNORMAL HIGH (ref 7–232)

## 2013-07-15 LAB — CORTISOL: Cortisol, Plasma: 52.1 ug/dL

## 2013-07-15 LAB — BASIC METABOLIC PANEL
BUN: 83 mg/dL — ABNORMAL HIGH (ref 6–23)
CO2: 20 mEq/L (ref 19–32)
Calcium: 7.9 mg/dL — ABNORMAL LOW (ref 8.4–10.5)
Chloride: 95 mEq/L — ABNORMAL LOW (ref 96–112)
GFR calc non Af Amer: 32 mL/min — ABNORMAL LOW (ref >90)
Glucose, Bld: 221 mg/dL — ABNORMAL HIGH (ref 70–99)
Potassium: 6.9 mEq/L (ref 3.5–5.1)
Sodium: 128 mEq/L — ABNORMAL LOW (ref 135–145)

## 2013-07-15 LAB — TROPONIN I
Troponin I: 0.58 ng/mL (ref <0.30)
Troponin I: 3.82 ng/mL (ref <0.30)

## 2013-07-15 LAB — OCCULT BLOOD X 1 CARD TO LAB, STOOL: Fecal Occult Bld: NEGATIVE

## 2013-07-15 LAB — MRSA PCR SCREENING: MRSA by PCR: NEGATIVE

## 2013-07-15 LAB — GLUCOSE, CAPILLARY
Glucose-Capillary: 202 mg/dL — ABNORMAL HIGH (ref 70–99)
Glucose-Capillary: 177 mg/dL — ABNORMAL HIGH (ref 70–99)
Glucose-Capillary: 165 mg/dL — ABNORMAL HIGH (ref 70–99)

## 2013-07-15 LAB — PROTIME-INR
INR: 1.21 (ref 0.00–1.49)
Prothrombin Time: 15 s (ref 11.6–15.2)

## 2013-07-15 LAB — APTT: aPTT: 32 seconds (ref 24–37)

## 2013-07-15 LAB — PREPARE RBC (CROSSMATCH)

## 2013-07-15 MED ORDER — BIOTENE DRY MOUTH MT LIQD
15.0000 mL | Freq: Two times a day (BID) | OROMUCOSAL | Status: DC
  Administered 2013-07-15 – 2013-07-22 (×16): 15 mL via OROMUCOSAL

## 2013-07-15 MED ORDER — SODIUM BICARBONATE 8.4 % IV SOLN
50.0000 meq | Freq: Once | INTRAVENOUS | Status: AC
  Administered 2013-07-15: 50 meq via INTRAVENOUS
  Filled 2013-07-15 (×2): qty 50

## 2013-07-15 MED ORDER — HEPARIN SODIUM (PORCINE) 1000 UNIT/ML DIALYSIS
1000.0000 [IU] | INTRAMUSCULAR | Status: DC | PRN
  Administered 2013-07-15 – 2013-07-17 (×2): 2400 [IU] via INTRAVENOUS_CENTRAL
  Filled 2013-07-15: qty 3
  Filled 2013-07-15: qty 6
  Filled 2013-07-15: qty 3

## 2013-07-15 MED ORDER — SODIUM CHLORIDE 0.9 % IV SOLN
INTRAVENOUS | Status: DC
Start: 2013-07-15 — End: 2013-07-26
  Administered 2013-07-15: 17:00:00 via INTRAVENOUS
  Administered 2013-07-15: 10 mL/h via INTRAVENOUS
  Administered 2013-07-23: 19:00:00 via INTRAVENOUS

## 2013-07-15 MED ORDER — INSULIN ASPART 100 UNIT/ML ~~LOC~~ SOLN
0.0000 [IU] | SUBCUTANEOUS | Status: DC
  Administered 2013-07-15 (×2): 3 [IU] via SUBCUTANEOUS
  Administered 2013-07-15: 5 [IU] via SUBCUTANEOUS
  Administered 2013-07-15: 3 [IU] via SUBCUTANEOUS
  Administered 2013-07-15 (×2): 5 [IU] via SUBCUTANEOUS
  Administered 2013-07-15: 8 [IU] via SUBCUTANEOUS
  Administered 2013-07-16: 5 [IU] via SUBCUTANEOUS
  Administered 2013-07-16 (×3): 3 [IU] via SUBCUTANEOUS
  Administered 2013-07-16: 5 [IU] via SUBCUTANEOUS
  Administered 2013-07-16 – 2013-07-17 (×2): 3 [IU] via SUBCUTANEOUS
  Administered 2013-07-17: 8 [IU] via SUBCUTANEOUS
  Administered 2013-07-17: 3 [IU] via SUBCUTANEOUS
  Administered 2013-07-17: 8 [IU] via SUBCUTANEOUS
  Administered 2013-07-17 – 2013-07-18 (×3): 5 [IU] via SUBCUTANEOUS
  Administered 2013-07-18: 3 [IU] via SUBCUTANEOUS
  Administered 2013-07-18: 8 [IU] via SUBCUTANEOUS
  Administered 2013-07-18: 5 [IU] via SUBCUTANEOUS
  Administered 2013-07-18: 3 [IU] via SUBCUTANEOUS
  Administered 2013-07-18: 5 [IU] via SUBCUTANEOUS
  Administered 2013-07-19: 3 [IU] via SUBCUTANEOUS
  Administered 2013-07-19: 2 [IU] via SUBCUTANEOUS
  Administered 2013-07-19: 3 [IU] via SUBCUTANEOUS

## 2013-07-15 MED ORDER — PRISMASOL B22GK 4/0 22-4 MEQ/L IV SOLN
INTRAVENOUS | Status: DC
  Administered 2013-07-15 – 2013-07-16 (×3): via INTRAVENOUS_CENTRAL
  Filled 2013-07-15 (×5): qty 5000

## 2013-07-15 MED ORDER — PRISMASOL BGK 4/2.5 32-4-2.5 MEQ/L IV SOLN
INTRAVENOUS | Status: DC
  Administered 2013-07-15 – 2013-07-17 (×15): via INTRAVENOUS_CENTRAL
  Filled 2013-07-15 (×34): qty 5000

## 2013-07-15 MED ORDER — SODIUM CHLORIDE 0.9 % FOR CRRT
INTRAVENOUS_CENTRAL | Status: DC | PRN
  Administered 2013-07-15: 17:00:00 via INTRAVENOUS_CENTRAL
  Filled 2013-07-15: qty 1000

## 2013-07-15 MED ORDER — SODIUM CHLORIDE 0.9 % IJ SOLN
3.0000 mL | Freq: Two times a day (BID) | INTRAMUSCULAR | Status: DC
  Administered 2013-07-15: 3 mL via INTRAVENOUS
  Administered 2013-07-15: 10 mL via INTRAVENOUS
  Administered 2013-07-15: 6 mL via INTRAVENOUS
  Administered 2013-07-16 – 2013-07-17 (×3): 3 mL via INTRAVENOUS
  Administered 2013-07-17: 08:00:00 via INTRAVENOUS
  Administered 2013-07-19 – 2013-07-23 (×5): 3 mL via INTRAVENOUS
  Administered 2013-07-24: 10:00:00 via INTRAVENOUS
  Administered 2013-07-25: 3 mL via INTRAVENOUS

## 2013-07-15 MED ORDER — INSULIN ASPART 100 UNIT/ML ~~LOC~~ SOLN: 10.0000 [IU] | Freq: Once | SUBCUTANEOUS | Status: DC

## 2013-07-15 MED ORDER — CALCIUM GLUCONATE 10 % IV SOLN
1.0000 g | Freq: Once | INTRAVENOUS | Status: AC
  Administered 2013-07-15: 1 g via INTRAVENOUS
  Filled 2013-07-15 (×2): qty 10

## 2013-07-15 MED ORDER — DEXTROSE 50 % IV SOLN
1.0000 | Freq: Once | INTRAVENOUS | Status: AC
  Administered 2013-07-15: 50 mL via INTRAVENOUS

## 2013-07-15 MED ORDER — FAMOTIDINE IN NACL 20-0.9 MG/50ML-% IV SOLN
20.0000 mg | Freq: Two times a day (BID) | INTRAVENOUS | Status: DC
  Administered 2013-07-15 – 2013-07-19 (×8): 20 mg via INTRAVENOUS
  Filled 2013-07-15 (×10): qty 50

## 2013-07-15 MED ORDER — INSULIN ASPART 100 UNIT/ML ~~LOC~~ SOLN
10.0000 [IU] | Freq: Once | SUBCUTANEOUS | Status: AC
  Administered 2013-07-15: 10 [IU] via INTRAVENOUS

## 2013-07-15 MED ORDER — DEXTROSE 50 % IV SOLN
25.0000 mL | Freq: Once | INTRAVENOUS | Status: AC
  Administered 2013-07-15: 25 mL via INTRAVENOUS
  Filled 2013-07-15: qty 50

## 2013-07-15 MED ORDER — DEXTROSE 5 % IV SOLN
2.0000 g | Freq: Two times a day (BID) | INTRAVENOUS | Status: DC
  Administered 2013-07-15 – 2013-07-17 (×4): 2 g via INTRAVENOUS
  Filled 2013-07-15 (×5): qty 2

## 2013-07-15 MED ORDER — FUROSEMIDE 10 MG/ML IJ SOLN
80.0000 mg | Freq: Once | INTRAMUSCULAR | Status: AC
  Administered 2013-07-15: 80 mg via INTRAVENOUS
  Filled 2013-07-15: qty 8

## 2013-07-15 MED ORDER — HEPARIN SODIUM (PORCINE) 5000 UNIT/ML IJ SOLN
5000.0000 [IU] | Freq: Three times a day (TID) | INTRAMUSCULAR | Status: DC
  Filled 2013-07-15 (×3): qty 1

## 2013-07-15 MED ORDER — INSULIN ASPART 100 UNIT/ML IV SOLN
10.0000 [IU] | Freq: Once | INTRAVENOUS | Status: AC
  Administered 2013-07-15: 10 [IU] via INTRAVENOUS

## 2013-07-15 MED ORDER — DEXTROSE 50 % IV SOLN
INTRAVENOUS | Status: AC
  Filled 2013-07-15: qty 50

## 2013-07-15 MED ORDER — PRISMASOL B22GK 4/0 22-4 MEQ/L IV SOLN
INTRAVENOUS | Status: DC
  Administered 2013-07-15 – 2013-07-17 (×2): via INTRAVENOUS_CENTRAL
  Filled 2013-07-15 (×4): qty 5000

## 2013-07-15 NOTE — Significant Event (Signed)
Hb down to 5.7 (last Hb from 9/25 was 8.2).  Possibly from oozing of Rt leg stump >> no other obvious sources of bleeding.  Will transfuse 2 units PRBC.  Hold SQ heparin.  Coralyn Helling, MD 07/15/2013, 12:22 AM

## 2013-07-15 NOTE — Progress Notes (Signed)
Patient ID: Albert [name], male   DOB: 1961/02/02, 52 y.o.   MRN: [id number] Right  BKA wound stable, 2X3 cm.  Dry dressing change daily.  I'll f/u after d/c

## 2013-07-15 NOTE — Significant Event (Signed)
BMET    Component Value Date/Time   NA 127* 07/15/2013 0430   K 7.5* 07/15/2013 0430   CL 94* 07/15/2013 0430   CO2 17* 07/15/2013 0430   GLUCOSE 273* 07/15/2013 0430   BUN 81* 07/15/2013 0430   CREATININE 1.84* 07/15/2013 0430   CALCIUM 7.9* 07/15/2013 0430   GFRNONAA 41* 07/15/2013 0430   GFRAA 48* 07/15/2013 0430    Will give bicarb, calcium gluconate, insulin/D50.  Check ECG.  F/u BMET.  Coralyn Helling, MD 07/15/2013, 6:07 AM

## 2013-07-15 NOTE — Procedures (Signed)
Central Venous hemodialysis Catheter Insertion Procedure Note EZREAL TURAY [id number] 1961-05-03  Procedure: Insertion of Central Venous Catheter Indications: hemodialysis   Procedure Details Consent: hemodialysis  Time Out: Verified patient identification, verified procedure, site/side was marked, verified correct patient position, special equipment/implants available, medications/allergies/relevent history reviewed, required imaging and test results available.  Performed Real time [place] used to ID and cannulate the right IJ  Maximum sterile technique was used including antiseptics, cap, gloves, gown, hand hygiene, mask and sheet. Skin prep: Chlorhexidine; local anesthetic administered A antimicrobial bonded/coated triple lumen catheter was placed in the right internal jugular vein using the Seldinger technique.  Evaluation Blood flow good Complications: No apparent complications Patient did tolerate procedure well. Chest X-ray ordered to verify placement.  CXR: pending.  BABCOCK,PETE 07/15/2013, 4:35 PM  I was present for and supervised the entire procedure  Billy Fischer, MD ; [facility] [phone].  After 5:30 PM or weekends, call [phone]

## 2013-07-15 NOTE — Care Management Note (Addendum)
    Page 1 of 1   07/26/2013     11:25:42 AM   CARE MANAGEMENT NOTE 07/26/2013  Patient:  Albert [name], Albert [name]   Account Number:  [id number]  Date Initiated:  07/15/2013  Documentation initiated by:  Junius Creamer  Subjective/Objective Assessment:   adm w hyperkalemia, in fld     Action/Plan:   lives w wife, pcp dr Fayrene Fearing [facility], was in inpt rehab post amput pta this time   Anticipated DC Date:     Anticipated DC Plan:        DC Planning Services  CM consult      Choice offered to / List presented to:             Status of service:  Completed, signed off Medicare Important Message given?   (If response is "NO", the following Medicare IM given date fields will be blank) Date Medicare IM given:   Date Additional Medicare IM given:    Discharge Disposition:  IP REHAB FACILITY  Per UR Regulation:  Reviewed for med. necessity/level of care/duration of stay  If discussed at Long Length of Stay Meetings, dates discussed:   07/23/2013  07/25/2013    Comments:  07-26-13 1123 Tomi Bamberger, RN,BSN [phone] Pt planned for D/c to Inpatient Rehab today. Cardiology to f/u with pt due to diuresing with IV lasix. No further needs from CM at this time.

## 2013-07-15 NOTE — Progress Notes (Signed)
CRITICAL VALUE ALERT  Critical value received: Hgb = 5.7 Date of notification:  07/14/13  Time of notification: 2335 Critical value read back:yes  Nurse who received alert: Melina Modena MD notified (1st page): Yes  Time of first page:2335 MD notified (2nd page):  Time of second page:  Responding MD: CCM ( Dr Craige Cotta) Time MD responded: [phone]

## 2013-07-15 NOTE — Progress Notes (Signed)
Inpatient Diabetes Program Recommendations  AACE/ADA: New Consensus Statement on Inpatient Glycemic Control (2013)  Target Ranges:  Prepandial:   less than 140 mg/dL      Peak postprandial:   less than 180 mg/dL (1-2 hours)      Critically ill patients:  140 - 180 mg/dL   Results for Albert [name], Albert [name] (MRN [id number]) as of 07/15/2013 12:31  Ref. Range 07/14/2013 16:37 07/14/2013 21:12 07/14/2013 23:54 07/15/2013 04:00 07/15/2013 11:23  Glucose-Capillary Latest Range: 70-99 mg/dL 409 (H) 811 (H) 914 (H) 235 (H) 202 (H)   Consider using ICU Hyperglycemia Protocol. Thank you  Piedad Climes BSN, RN,CDE Inpatient Diabetes Coordinator [phone] (team pager)

## 2013-07-15 NOTE — Consult Note (Signed)
I have seen and examined this patient and agree with the plan of care . Shock which may be cardiogenic versus Septic. Will stop lisinopril and metoprolol for now. Plan CVVHD for hyperkalemia.  Edgar Corrigan W 07/15/2013, 4:09 PM

## 2013-07-15 NOTE — Progress Notes (Signed)
Discussed with Dr. Craige Cotta. Patient will be transferred to [facility] service.  Jonah Blue, DO, FACP 07/15/2013, 2:32 AM

## 2013-07-15 NOTE — Progress Notes (Signed)
CRITICAL VALUE ALERT  Critical value received:  K=6.9  Date of notification:  07/15/13  Time of notification:  1015  Critical value read back:yes  Nurse who received alert:  Doy Mince, RN  MD notified (1st page):  Dr. Delton Coombes at bedside  Time of first page:    MD notified (2nd page):  Time of second page:  Responding MD:    Time MD responded:

## 2013-07-15 NOTE — Discharge Summary (Signed)
  Discharge summary job [id number]

## 2013-07-15 NOTE — H&P (Signed)
Name: Albert [name] MRN: [id number] DOB: 03/30/1961    LOS: 1  Referring Provider:  Dr/ paya Reason for Referral:  Hypoxemia and hypotension  PULMONARY / CRITICAL CARE MEDICINE  HPI:  [name] is a 52 year old male with past medical history significant for diabetes, CHF, and recent BKA for staph osteomyelitis, who is transferred from rehabilitation to the step down unit for hypotension earlier today.  PCCM was consulted for worsening hypoxemia.  [name] has been followed by cardiology since admission for three-vessel coronary artery disease which potentially require CABG.  This has been delayed to allow adequate treatment of his current infection.  Over the last 3 days he has had worsening hypotension and shortness of breath.  This was initially attributed to worsening heart failure and volume overload.  He has been diuresed with Lasix however remains volume overloaded.  On the night of September 26 he had a fall dating back into his bed.  At that time his wound dehisced and he developed bleeding from his wound requiring multiple dressing changes.  On the evening of 928 he had an episode of worsening shortness of breath after fluid bolus for hypotension.  Chest x-ray shows bilateral large pleural effusions as well as pulmonary edema.  A CBC was obtained after transfer to [facility] M. Damaris hemoglobin of 5.7.  Past Medical History  Diagnosis Date  . Hypertension   . Diabetes mellitus without complication ~2000    last HbA1c ~9  . Hypercholesteremia   . CVA (cerebral infarction) 2011    R hand deficit  . Chronic sinusitis   . Allergic rhinitis   . Chronic cough   . Complication of anesthesia     couldn't swallow and talk  . Stroke    Past Surgical History  Procedure Laterality Date  . 4th toe amputation Left Jan. 2014    4th toe in [place]  . Tonsillectomy and adenoidectomy    . Penile prosthesis implant    . I&d extremity Left 12/08/2012    Procedure: IRRIGATION AND DEBRIDEMENT  EXTREMITY;  Surgeon: Kathryne Hitch, MD;  Location: [facility] OR;  Service: Orthopedics;  Laterality: Left;  . Amputation Left 12/08/2012    Procedure: AMPUTATION DIGIT;  Surgeon: Kathryne Hitch, MD;  Location: [facility] OR;  Service: Orthopedics;  Laterality: Left;  3rd toe amputation possible 5th toe amputation  . I&d extremity Left 12/11/2012    Procedure: REPEAT I&D LEFT FOOT;  Surgeon: Kathryne Hitch, MD;  Location: [facility] OR;  Service: Orthopedics;  Laterality: Left;  . Amputation Left 12/11/2012    Procedure: Amputation of 2nd Toe;  Surgeon: Kathryne Hitch, MD;  Location: [facility] OR;  Service: Orthopedics;  Laterality: Left;  . Bypass graft popliteal to tibial Left 12/13/2012    Procedure: BYPASS GRAFT POPLITEAL TO TIBIAL;  Surgeon: Nada Libman, MD;  Location: [facility] OR;  Service: Vascular;  Laterality: Left;  Left Popliteal to Posterior Tibial Bypass Graft with reversed saphenous vein graft  . Incision and drainage Right 06/25/2013    Procedure: INCISION AND DRAINAGE RIGHT FOOT;  Surgeon: Kathryne Hitch, MD;  Location: [facility] ORS;  Service: Orthopedics;  Laterality: Right;  . Amputation Right 06/29/2013    Procedure: AMPUTATION BELOW KNEE;  Surgeon: Nadara Mustard, MD;  Location: [facility] OR;  Service: Orthopedics;  Laterality: Right;  . Tee without cardioversion [name] 07/03/2013    Procedure: TRANSESOPHAGEAL ECHOCARDIOGRAM (TEE);  Surgeon: Laurey Morale, MD;  Location: [facility] ENDOSCOPY;  Service: Cardiovascular;  Laterality: [name];  Prior to Admission medications   Medication Sig Start Date End Date Taking? Authorizing Provider  aspirin 81 MG tablet Take 81 mg by mouth daily.    Historical Provider, MD  ceFAZolin (ANCEF) 2-3 GM-% SOLR Inject 50 mLs (2 g total) into the vein every 8 (eight) hours. 07/03/13 07/24/13  Penny Pia, MD  docusate sodium 100 MG CAPS Take 100 mg by mouth 2 (two) times daily. 07/03/13   Penny Pia, MD  fentaNYL (SUBLIMAZE) 0.05 MG/ML injection Inject 1-2 mLs (50-100  mcg total) into the vein every 2 (two) hours as needed for severe pain (or RASS > 0). 07/03/13   Penny Pia, MD  glyBURIDE-metformin (GLUCOVANCE) 5-500 MG per tablet Take 2 tablets by mouth 2 (two) times daily.    Historical Provider, MD  insulin aspart (NOVOLOG FLEXPEN) 100 UNIT/ML injection Inject 12 Units into the skin 3 (three) times daily. Take every time he eats per patient    Historical Provider, MD  insulin detemir (LEVEMIR) 100 UNIT/ML injection Inject 0.25 mLs (25 Units total) into the skin every evening. 07/03/13   Penny Pia, MD  lisinopril (PRINIVIL,ZESTRIL) 5 MG tablet Take 1 tablet (5 mg total) by mouth daily. 07/03/13   Penny Pia, MD  metoCLOPramide (REGLAN) 5 MG tablet Take 1-2 tablets (5-10 mg total) by mouth every 8 (eight) hours as needed (if ondansetron (ZOFRAN) ineffective.). 07/03/13   Penny Pia, MD  metoCLOPramide (REGLAN) 5 MG/ML injection Inject 1-2 mLs (5-10 mg total) into the vein every 8 (eight) hours as needed (if ondansetron (ZOFRAN) ineffective.). 07/03/13   Penny Pia, MD  metoprolol (LOPRESSOR) 50 MG tablet Take 1 tablet (50 mg total) by mouth 2 (two) times daily. 07/03/13   Penny Pia, MD  ondansetron (ZOFRAN) 8 MG tablet Take by mouth every 8 (eight) hours as needed for nausea. For upset stomach    Historical Provider, MD  oxyCODONE (OXY IR/ROXICODONE) 5 MG immediate release tablet Take 1 tablet (5 mg total) by mouth every 4 (four) hours as needed. 07/03/13   Penny Pia, MD  pantoprazole (PROTONIX) 40 MG tablet Take 1 tablet (40 mg total) by mouth daily. 07/03/13   Penny Pia, MD  pregabalin (LYRICA) 75 MG capsule Take 75 mg by mouth 2 (two) times daily.    Historical Provider, MD  simvastatin (ZOCOR) 40 MG tablet Take 40 mg by mouth every evening.    Historical Provider, MD   Allergies Allergies  Allergen Reactions  . Amoxicillin     vomiting    Family History Family History  Problem Relation Age of Onset  . Diabetes    . Pancreatic cancer  Mother   . Diabetes Mother   . Hyperlipidemia Mother   . Breast cancer Sister   . Depression Maternal Grandmother   . Heart disease Maternal Grandmother   . Depression Maternal Grandfather   . Heart disease Maternal Grandfather   . Depression Paternal Grandmother   . Heart disease Paternal Grandmother   . Depression Paternal Grandfather   . Heart disease Paternal Grandfather    Social History  reports that he has never smoked. He has never used smokeless tobacco. He reports that he does not drink alcohol or use illicit drugs.  Review Of Systems:  Denies pain.  Endorses shortness of breath.  A review of 14 systems was otherwise negative except as stated in history of present illness  Brief patient description:  52 year-old status post BKA for gangrenous foot and staph osteomyelitis, acute CHF, symptomatic anemia.  Events  Since Admission: Transferred from rehabilitation on 07/14/2013 Transferred to PCCM and on 07/14/2013  Current Status:  Vital Signs: Temp:  [97.2 F (36.2 C)-98.3 F (36.8 C)] 97.6 F (36.4 C) (09/29 0229) Pulse Rate:  [79-106] 79 (09/29 0229) Resp:  [15-31] 17 (09/29 0229) BP: (79-131)/(18-89) 105/89 mmHg (09/29 0229) SpO2:  [83 %-100 %] 100 % (09/29 0208) FiO2 (%):  [28 %] 28 % (09/28 2306) Weight:  [133.902 kg (295 lb 3.2 oz)-134.3 kg (296 lb 1.2 oz)] 134 kg (295 lb 6.7 oz) (09/28 2315)  Physical Examination: General:  Obese male, laying in bed, mild distress Neuro:  Alert and oriented x3, CN II through XII intact HEENT:  Pupils equally round and reactive to light, extraocular muscles intact, OP clear Neck:  Supple, no masses or lesions, JVD to the ear Cardiovascular:  Tachycardic, regular rate, no murmurs rubs or gallops Lungs:  Lung sounds significantly diminished bilaterally more so on the right, faint expiratory wheeze anteriorly apically Abdomen:  Obese, nontender nondistended, positive bowel sounds Musculoskeletal:  4+ pitting edema, no  clubbing Skin:  Bruising over her left knee , dressings clean and intact  Principal Problem:   Anemia Active Problems:   Shock circulatory   Acute kidney failure   ASSESSMENT AND PLAN  PULMONARY  Recent Labs Lab 07/14/13 2237  PHART 7.375  PCO2ART 33.1*  PO2ART 133.0*  HCO3 18.9*  O2SAT 99.9   Ventilator Settings: Vent Mode:  [-]  FiO2 (%):  [28 %] 28 % CXR:  Pulmonary edema, bilateral pleural effusions greater on right  ETT:  None  A:  Respiratory distress secondary to anemia, pulmonary edema and bilateral pleural effusions P:   Currently stable on 6 L nasal cannula Wean oxygen as able Diuresis for pleural effusions and pulmonary edema.  CARDIOVASCULAR  Recent Labs Lab 07/11/13 1630 07/11/13 2300 07/12/13 0510 07/14/13 2340  TROPONINI <0.30 <0.30 <0.30 0.58*  PROBNP 23575.0*  --   --   --    ECG:  Sinus tachycardia Lines: PICC, peripheral IV  A: Acute congestive heart failure P:  Secondary to volume overload and anemia Diurese with Lasix Daily BMP Cardiology following  RENAL  Recent Labs Lab 07/11/13 1050 07/12/13 0510 07/13/13 1250 07/14/13 0730 07/14/13 2000  NA 127* 130* 131* 128* 131*  K 5.0 5.0 5.0 5.9* 5.9*  CL 93* 97 99 97 99  CO2 24 25 27 23 23   BUN 82* 74* 67* 72* 75*  CREATININE 1.36* 1.28 1.25 1.48* 1.77*  CALCIUM 8.1* 8.0* 7.4* 7.4* 7.5*   Intake/Output     09/28 0701 - 09/29 0700   I.V. (mL/kg) 47.3 (0.4)   Other 50   Total Intake(mL/kg) 97.3 (0.7)   Urine (mL/kg/hr) 15   Total Output 15   Net +82.3        Foley:  Placed 07/14/2013  A:  Acute renal failure P:   Likely secondary to anemia and CHF Follow daily creatinine Patient requires diuresis for improvement of CHF and forward flow Follow urine output closely Consider nephrology consult if creatinine continues to worsen  GASTROINTESTINAL No results found for this basename: AST, ALT, ALKPHOS, BILITOT, PROT, ALBUMIN,  in the last 168 hours  A:   No current  problems P:   N.p.o. for now  HEMATOLOGIC  Recent Labs Lab 07/11/13 1050 07/14/13 2340  HGB 8.2* 5.7*  HCT 25.1* 18.2*  PLT 448* 363  INR  --  1.21  APTT  --  32   A:  Anemia  P:  Likely secondary to acute bleeding after fall Transfuse 2 units now Follow hemoglobin and hematocrit Target Hemoglobin 8-9  INFECTIOUS  Recent Labs Lab 07/11/13 1050 07/14/13 2340  WBC 10.7* 14.7*   Cultures: No new cultures Antibiotics: Currently on cefepime, vancomycin on hold given recent trough of 30  A:  History of MSSA infection P:   Previously on Ancef, start date 06/29/2013 Date 07/26/2013 Transitions to bank and cefepime over concern for pneumonia Patient afebrile with no cough or sputum production, consider transitioning back to Ancef    ENDOCRINE  Recent Labs Lab 07/14/13 0730 07/14/13 1107 07/14/13 1637 07/14/13 2112 07/14/13 2354  GLUCAP 235* 197* 254* 189* 280*   A:  Diabetes   P:   Patient on sliding scale insulin Will likely also need long-acting insulin   NEUROLOGIC  A:  No current problems P:     BEST PRACTICE / DISPOSITION Level of Care:  ICU Primary Service:  PC CM Consultants:  Orthopedics, cardiology, infectious disease Code Status:  Full Diet:  N.p.o. DVT Px:  Heparin GI Px:  PPI Skin Integrity:  Good Social / Family:  Wife updated earlier this evening   I spent 35 minutes of critical care time in the care of this patient seperate from procedures which are documented elsewhere   Carolan Clines., M.D. Pulmonary and Critical Care Medicine [facility] Pager: [phone]  07/15/2013, 2:51 AM

## 2013-07-15 NOTE — Progress Notes (Signed)
Echocardiogram 2D Echocardiogram has been performed.  Albert [name] 07/15/2013, 11:53 AM

## 2013-07-15 NOTE — Progress Notes (Signed)
ANTIBIOTIC CONSULT NOTE - FOLLOW UP  Pharmacy Consult for Vancomycin and Cefepime Indication: pneumonia  Allergies  Allergen Reactions  . Amoxicillin Nausea And Vomiting    Patient Measurements: Height: 5\' 10"  (177.8 cm) Weight: 299 lb 13.2 oz (136 kg) IBW/kg (Calculated) : 73  Vital Signs: Temp: 97.5 F (36.4 C) (09/29 1200) Temp src: Oral (09/29 1200) BP: 106/70 mmHg (09/29 1400) Pulse Rate: 98 (09/29 0715) Intake/Output from previous day: 09/28 0701 - 09/29 0700 In: 623.3 [I.V.:223.3; Blood:350] Out: 200 [Urine:200] Intake/Output from this shift: Total I/O In: 447.9 [I.V.:97.9; Blood:350] Out: 245 [Urine:245]  Labs:  Recent Labs  07/14/13 2000 07/14/13 2340 07/15/13 0430 07/15/13 0920  WBC  --  14.7*  --  14.2*  HGB  --  5.7*  --  7.6*  PLT  --  363  --  217  CREATININE 1.77*  --  1.84* 2.28*   Estimated Creatinine Clearance: 53.8 ml/min (by C-G formula based on Cr of 2.28).  Recent Labs  07/14/13 0700  VANCOTROUGH 30.0*    Assessment:  Transferred from Rehab unit to CCU overnight.  Vancomycin and Cefepime begun 9/26.  Vancomycin trough level of 30 mcg/ml on 9/28 at 7am, dose adjusted from 1250 mg to 750 mg IV q12h.  Last dose given ~6pm on 9/28. Creatinine up to 2.28 today; to begin CRRT when access available.    Previously on Ancef for MSSA bacteremia per ID - begun on 9/13, and plans were to continue for 4 weeks (thru 07/26/13). Ancef as dc'd on 9/26, when therapy was broadened to Vanc/Cefepime.  Ancef to resume if current regimen is stopped before 07/26/13.  Goal of Therapy:  appropriate Cefepime dose for renal function and infection Vancomycin troughs ~15 mcg/ml  Plan:   Cefepime 2 grams IV q12h while on CRRT.  Hold Vancomycin today.     Random Vancomycin level in am.  Will begin Vancomycin daily dose for CRRT, when level < 15 mcg/ml.  Dennie Fetters, [place] Pager: [phone] 07/15/2013,4:06 PM

## 2013-07-15 NOTE — Progress Notes (Signed)
Notified pts Wife about pts condition and moving pt to ICU.  Wife did not want to come to hospital at this time. Report given to [employer]. Pt moved via bed with 6l [place]. Emilie [place]

## 2013-07-15 NOTE — Progress Notes (Signed)
CRITICAL VALUE ALERT  Critical value received: Ck MB = 10, Troponin = 0.58 Date of notification: 07/14/13 Time of notification: 0005 Critical value read back:Yes Nurse who received alert: Melina Modena MD notified (1st page): Yes Time of first page: 0010 MD notified (2nd page):  Time of second page:  Responding MD:  Dr. Florestine Avers Time MD responded: [phone]

## 2013-07-15 NOTE — Consult Note (Signed)
Albert [name] is an 52 y.o. male referred by Dr. Delton Coombes.   Chief Complaint: Worsening hypotension and dyspnea.  HPI: Patient is a 52 yo white male with a history of HTN (>10 yrs), DM (>10 yrs), BKA for staph osteo, NSTEMI, CVA, and CHF who was admitted from IR following a hospitalization for R BKA for staph osteo now with shortness of breath and hypotension. Over the 3 days prior to this admission the patient was noted to have worsening hypotension and shortness of breath. This had been attributed to worsening HF and he was diuresed and remained volume overloaded. He was subsequently admitted by CCM given hypotension and volume overload.  Today we are consulted for renal failure and hyperkalemia. Patients baseline renal function appears to be 0.8-0.9. On the 15th his Cr was 0.74 and rose to 1.78 on the 18th then improved to 1.25 on the 27th. Now his Cr has worsened to 2.28. Notably the patient appears volume overloaded based on CXR with bilateral large pleural effusions and pulmonary edema. Also per cardiology note he is up 30 lbs from his pre-op admission weight.   Patient denies any history of renal issues. No history of kidney stones, UTI, or family history of renal issues. Now he states is short of breath when lying flat, though has no chest pain. He denies smoking.  Past Medical History  Diagnosis Date  . Hypertension   . Diabetes mellitus without complication ~2000    last HbA1c ~9  . Hypercholesteremia   . CVA (cerebral infarction) 2011    R hand deficit  . Chronic sinusitis   . Allergic rhinitis   . Chronic cough   . Complication of anesthesia     couldn't swallow and talk  . Stroke     Past Surgical History  Procedure Laterality Date  . 4th toe amputation Left Jan. 2014    4th toe in [place]  . Tonsillectomy and adenoidectomy    . Penile prosthesis implant    . I&d extremity Left 12/08/2012    Procedure: IRRIGATION AND DEBRIDEMENT EXTREMITY;  Surgeon: Kathryne Hitch, MD;   Location: [facility] OR;  Service: Orthopedics;  Laterality: Left;  . Amputation Left 12/08/2012    Procedure: AMPUTATION DIGIT;  Surgeon: Kathryne Hitch, MD;  Location: [facility] OR;  Service: Orthopedics;  Laterality: Left;  3rd toe amputation possible 5th toe amputation  . I&d extremity Left 12/11/2012    Procedure: REPEAT I&D LEFT FOOT;  Surgeon: Kathryne Hitch, MD;  Location: [facility] OR;  Service: Orthopedics;  Laterality: Left;  . Amputation Left 12/11/2012    Procedure: Amputation of 2nd Toe;  Surgeon: Kathryne Hitch, MD;  Location: [facility] OR;  Service: Orthopedics;  Laterality: Left;  . Bypass graft popliteal to tibial Left 12/13/2012    Procedure: BYPASS GRAFT POPLITEAL TO TIBIAL;  Surgeon: Nada Libman, MD;  Location: [facility] OR;  Service: Vascular;  Laterality: Left;  Left Popliteal to Posterior Tibial Bypass Graft with reversed saphenous vein graft  . Incision and drainage Right 06/25/2013    Procedure: INCISION AND DRAINAGE RIGHT FOOT;  Surgeon: Kathryne Hitch, MD;  Location: [facility] ORS;  Service: Orthopedics;  Laterality: Right;  . Amputation Right 06/29/2013    Procedure: AMPUTATION BELOW KNEE;  Surgeon: Nadara Mustard, MD;  Location: [facility] OR;  Service: Orthopedics;  Laterality: Right;  . Tee without cardioversion [name] 07/03/2013    Procedure: TRANSESOPHAGEAL ECHOCARDIOGRAM (TEE);  Surgeon: Laurey Morale, MD;  Location: [facility] ENDOSCOPY;  Service: Cardiovascular;  Laterality: [name];    Family History  Problem Relation Age of Onset  . Diabetes    . Pancreatic cancer Mother   . Diabetes Mother   . Hyperlipidemia Mother   . Breast cancer Sister   . Depression Maternal Grandmother   . Heart disease Maternal Grandmother   . Depression Maternal Grandfather   . Heart disease Maternal Grandfather   . Depression Paternal Grandmother   . Heart disease Paternal Grandmother   . Depression Paternal Grandfather   . Heart disease Paternal Grandfather    Social History:  reports that he has never  smoked. He has never used smokeless tobacco. He reports that he does not drink alcohol or use illicit drugs.  Allergies:  Allergies  Allergen Reactions  . Amoxicillin Nausea And Vomiting    Medications Prior to Admission  Medication Sig Dispense Refill  . aspirin EC 81 MG tablet Take 81 mg by mouth daily.      Marland Kitchen ceFAZolin (ANCEF) 2-3 GM-% SOLR Inject 50 mLs (2 g total) into the vein every 8 (eight) hours.  50 mL  0  . docusate sodium 100 MG CAPS Take 100 mg by mouth 2 (two) times daily.  10 capsule  0  . fentaNYL (SUBLIMAZE) 0.05 MG/ML injection Inject 1-2 mLs (50-100 mcg total) into the vein every 2 (two) hours as needed for severe pain (or RASS > 0).  2 mL  0  . glyBURIDE-metformin (GLUCOVANCE) 5-500 MG per tablet Take 2 tablets by mouth 2 (two) times daily.      . insulin aspart (NOVOLOG FLEXPEN) 100 UNIT/ML injection Inject 12 Units into the skin 3 (three) times daily. Take every time he eats per patient      . insulin detemir (LEVEMIR) 100 UNIT/ML injection Inject 0.25 mLs (25 Units total) into the skin every evening.  10 mL  0  . irbesartan-hydrochlorothiazide (AVALIDE) 300-12.5 MG per tablet Take 1 tablet by mouth daily.      Marland Kitchen lisinopril (PRINIVIL,ZESTRIL) 5 MG tablet Take 1 tablet (5 mg total) by mouth daily.      . metoCLOPramide (REGLAN) 5 MG tablet Take 1-2 tablets (5-10 mg total) by mouth every 8 (eight) hours as needed (if ondansetron (ZOFRAN) ineffective.).      Marland Kitchen metoprolol (LOPRESSOR) 50 MG tablet Take 1 tablet (50 mg total) by mouth 2 (two) times daily.      . ondansetron (ZOFRAN) 8 MG tablet Take by mouth every 8 (eight) hours as needed for nausea. For upset stomach      . oxyCODONE (OXY IR/ROXICODONE) 5 MG immediate release tablet Take 1 tablet (5 mg total) by mouth every 4 (four) hours as needed.  30 tablet  0  . pantoprazole (PROTONIX) 40 MG tablet Take 1 tablet (40 mg total) by mouth daily.      . pregabalin (LYRICA) 75 MG capsule Take 75 mg by mouth 2 (two) times  daily.      . simvastatin (ZOCOR) 40 MG tablet Take 40 mg by mouth every evening.         Lab Results: UA: 0-2 RBC, 0-2 WBCs, few bacteria, no protein   Recent Labs  07/14/13 2340 07/15/13 0920  WBC 14.7* 14.2*  HGB 5.7* 7.6*  HCT 18.2* 22.5*  PLT 363 217   BMET  Recent Labs  07/14/13 2000 07/15/13 0430 07/15/13 0920  NA 131* 127* 128*  K 5.9* 7.5* 6.9*  CL 99 94* 95*  CO2 23 17* 20  GLUCOSE 220* 273* 221*  BUN 75* 81* 83*  CREATININE 1.77* 1.84* 2.28*  CALCIUM 7.5* 7.9* 7.9*   BNP 23575 Troponin 3.82  LFT No results found for this basename: PROT, ALBUMIN, AST, ALT, ALKPHOS, BILITOT, BILIDIR, IBILI,  in the last 72 hours Dg Chest Port 1 View  07/14/2013   CLINICAL DATA:  Hypoxemia, low blood pressure, respiratory distress.  EXAM: PORTABLE CHEST - 1 VIEW  COMPARISON:  07/12/2013  FINDINGS: Diffuse perihilar and scratch head diffuse bilateral airspace opacities compatible with edema. Heart is borderline in size. Suspect layering effusions. No acute bony abnormality.  IMPRESSION: Moderate pulmonary edema and layering effusions.   Electronically Signed   By: Charlett Nose M.D.   On: 07/14/2013 22:34    ROS: states is short of breath while laying flat, has edema. Denies chest pain, headache, abdominal pain, urinary difficulties.  PHYSICAL EXAM: Blood pressure 109/76, pulse 98, temperature 97.6 F (36.4 C), temperature source Oral, resp. rate 15, height 5\' 10"  (1.778 m), weight 299 lb 13.2 oz (136 kg), SpO2 100.00%. NECK: JVD noted to the jaw line LUNGS: bibasilar crackles CARDIAC: rrr, no mrg appreciated ABD: s, NT, ND, obese EXT: 3+ pitting edema to the mid thigh NEURO: alert  Assessment: Patient is a a 52 yo white male with a history of HTN (>10 yrs), DM (>10 yrs), BKA for staph osteo, NSTEMI, CVA, and CHF who was admitted from IR following a hospitalization for R BKA for staph osteo now with shortness of breath and hypotension.  PLAN: 1. Acute kidney injury:  patient with previously normal renal function. Now with hypotension potentially related to infection (had staph osteo, now with elevated WBC after having trended toward normal) vs cardiac cause (CHF with volume overload and rising troponin following recent NSTEMI) leading to poor perfusion of the kidneys and likely hypotensive ATN cause of AKI. - will start on CRRT given volume overload, worsening renal function, and hyperkalemia with goal of keeping even at this time given hypotensive BPs - continue to follow UOP and volume status to determine need for CRRT and amount of volume to be removed - will monitor BID renal panels - avoid nephrotoxic agents  2. Hyperkalemia: to 7.5 at highest. Likely related to AKI, though appears had been on ACEI while at IR and had steady rise in K. - being medically managed by CCM - will start on CRRT and monitor K with BID renal panels. - hold ACEI   3. Anemia: baseline appears to be around 12. Had progressive decline in hgb following surgery to a low of 5.7. Now 7.6 s/p transfusion 2 units. Likely related to recent surgery and dilutional with volume overload. - will continue to monitor at this time - consider ESA if continues to be low  4. CV: currently hypotensive and volume overloaded with 3+ pitting edema and up 30 lbs since pre-op with likely CHF exacerbation given BNP of ~23000. Patient was on lisinopril and metoprolol at IR.  - hold home BP meds  - will manage volume through CRRT once pressures can tolerate volume removal-at this time will keep even - f/u echo - monitor daily weights and I/O's  5. DM: on metformin/glyburide and levemir as outpatient.  - continue SSI per primary team  Marikay Alar 07/15/2013, 12:44 PM

## 2013-07-15 NOTE — Progress Notes (Signed)
eLink Physician-Brief Progress Note Patient Name: DONIVAN THAMMAVONG DOB: [birth year] MRN: [id number]  Date of Service  07/15/2013   HPI/Events of Note   Started abx that did not cross over from his rehab orders > vanco + cefepime  eICU Interventions     Intervention Category Intermediate Interventions: Infection - evaluation and management  Roshawn Lacina S. 07/15/2013, 3:56 PM

## 2013-07-15 NOTE — Progress Notes (Signed)
CRITICAL VALUE ALERT  Critical value received:  potasium = 7.5 Date of notification:  07/15/13  Time of notification: 0559  Critical value read back:Yes  Nurse who received alert:  O. Colleene Swarthout MD notified (1st page):  Yes  Time of first page: 0603 MD notified (2nd page):  Time of second page:  Responding MD: CCM (Dr Craige Cotta) Time MD responded: [phone]

## 2013-07-15 NOTE — Progress Notes (Signed)
Patient: Albert [name] / Admit Date: 07/14/2013 / Date of Encounter: 07/15/2013, 7:35 AM   Subjective  Pt states he feels a "whole lot better" with more energy than last night (albeit still appears ill). No CP, SOB, leg pain.   Objective   Telemetry: NSR/ST with rare occasional sinus pause <1.6sec  EKG: Sinus tach 109bpm incomplete LBBB QRS 106 nonspecific ST-T changes (previous QRS 102) Physical Exam: Filed Vitals:   07/15/13 0645  BP: 99/62  Pulse: 105  Temp: 97.5  Resp: 19 100% [place] 6L   General: Well developed WM in no acute distress, appearing chronically ill Head: Normocephalic, atraumatic, sclera non-icteric, no xanthomas, nares are without discharge. Neck: JVD moderately elevated. Lungs: Diminished at bases otherwise clear. Breathing is unlabored. Heart: RRR S1 S2 without murmurs, rubs, or gallops.  Abdomen: Soft, non-tender, rounded with normoactive bowel sounds. No hepatomegaly. No rebound/guarding. No obvious abdominal masses. Msk:  Strength and tone appear normal for age. Extremities: No clubbing or cyanosis. Significant edema LLE with cluster of petechial appearing spots above L knee. S/p R BKA. SCDs in place. Neuro: Alert and oriented X 3. Moves all extremities spontaneously. Psych:  Responds to questions appropriately with a normal affect.    Intake/Output Summary (Last 24 hours) at 07/15/13 0735 Last data filed at 07/15/13 0600  Gross per 24 hour  Intake  263.3 ml  Output    200 ml  Net   63.3 ml    Inpatient Medications:  . insulin aspart  0-15 Units Subcutaneous Q4H  . sodium chloride  3 mL Intravenous Q12H    Labs:  Recent Labs  07/14/13 2000 07/15/13 0430  NA 131* 127*  K 5.9* 7.5*  CL 99 94*  CO2 23 17*  GLUCOSE 220* 273*  BUN 75* 81*  CREATININE 1.77* 1.84*  CALCIUM 7.5* 7.9*    Recent Labs  07/14/13 2340  WBC 14.7*  NEUTROABS 11.1*  HGB 5.7*  HCT 18.2*  MCV 82.4  PLT 363    Recent Labs  07/14/13 2340 07/15/13 0430    CKTOTAL 288*  --   CKMB 10.0*  --   TROPONINI 0.58* 3.82*    Radiology/Studies:  Dg Chest 2 View  07/12/2013   CLINICAL DATA:  Airspace opacities in the lungs and pleural effusions.  EXAM: CHEST  2 VIEW  COMPARISON:  07/11/2013  FINDINGS: Left central line tip projects over the lower SVC.  Mildly increased airspace opacity in the right middle lobe. Otherwise the bilateral perihilar airspace opacities appear stable.  Cardiothoracic index 57%. Moderate pleural effusions on the lateral projection.  IMPRESSION: 1. Moderate bilateral pleural effusions. Perihilar airspace opacities with mild cardiomegaly favoring pulmonary edema over atypical pneumonia.   Electronically Signed   By: Herbie Baltimore   On: 07/12/2013 07:43   Dg Chest 2 View  07/11/2013   CLINICAL DATA:  Shortness breath. Cough. Diabetic and hypertensive patient.  EXAM: CHEST  2 VIEW  COMPARISON:  06/27/2013.  FINDINGS: Left PICC line tip distal superior vena cava level just above the cavoatrial junction.  Progressive diffuse asymmetric airspace disease with bilateral pleural effusions. Although pulmonary edema may explain this appearance, the patchy consolidation raises possibility of superimposed infection. Clinical correlation recommended. Followup until clearance recommended.  No gross pneumothorax.  Cardiomegaly.  IMPRESSION: Progressive diffuse asymmetric airspace disease with bilateral pleural effusions. Although pulmonary edema may explain this appearance, the patchy consolidation raises possibility of superimposed infection. Clinical correlation recommended.  Please see above.  These results will be called  to the ordering clinician or representative by the Radiologist Assistant, and communication documented in the PACS Dashboard.   Electronically Signed   By: Bridgett Larsson   On: 07/11/2013 08:58   Dg Chest Port 1 View  07/14/2013   CLINICAL DATA:  Hypoxemia, low blood pressure, respiratory distress.  EXAM: PORTABLE CHEST - 1 VIEW   COMPARISON:  07/12/2013  FINDINGS: Diffuse perihilar and scratch head diffuse bilateral airspace opacities compatible with edema. Heart is borderline in size. Suspect layering effusions. No acute bony abnormality.  IMPRESSION: Moderate pulmonary edema and layering effusions.   Electronically Signed   By: Charlett Nose M.D.   On: 07/14/2013 22:34   Dg Chest Port 1 View  06/27/2013   *RADIOLOGY REPORT*  Clinical Data: Shortness of breath, evaluate endotracheal tube position  PORTABLE CHEST - 1 VIEW  Comparison: Chest x-ray of 01/15/2013  Findings: No endotracheal tube is visible.  The lungs are not as well aerated and may be mild pulmonary vascular congestion present with some basilar volume loss.  The heart is within upper limits of normal.  No bony abnormality is seen.  IMPRESSION: Diminished aeration.  Question mild pulmonary vascular congestion.   Original Report Authenticated By: Dwyane Dee, M.D.   Dg Foot Complete Right  06/25/2013   *RADIOLOGY REPORT*  Clinical Data: Foot abscess; diabetes mellitus  RIGHT FOOT COMPLETE - 3+ VIEW  Comparison: None.  Findings: Frontal, oblique, lateral views were obtained.  There are Charcot changes throughout the midfoot.  There is fragmentation in the tarsal - metatarsal region with a divergent Lisfranc type fracture in the mid foot between the first and second metatarsals.  There is extensive soft tissue air throughout the volar aspect of the foot consistent with widespread abscess.  There is no well- defined osteomyelitis on this study.  There is narrowing of all PIP and DIP joints.  There is pes planus.  IMPRESSION: Extensive soft tissue air consistent with abscess tracking along the volar aspect of the foot somewhat diffusely but concentrated primarily at the level of the metatarsals.  No overt osteomyelitis.  Charcot changes with fragmentation of the mid foot region. Lisfranc type separation between the first and second metatarsals.  Pes planus.  Multilevel  osteoarthritic change in the PIP and DIP joints.   Original Report Authenticated By: Bretta Bang, M.D.     Assessment and Plan  1. Recent sepsis/MSSA bacteremia (adm 9/9->9/17->CIR) from gangrenous R foot s/p R BKA 06/29/13, TEE negative for bacteremia 07/03/13 2. 3-vessel CAD by cath 06/28/13 (inital plan was for CABG once recovered from BKA), now with 2nd NSTEMI (peak troponin 15 on 06/26/13 that had normalized, today went up to 3.82) 3. Acute hypoxic respiratory failure secondary to anemia, pulmonary edema, and bilateral pleural effusions 4. Ischemic cardiomyopathy with acute systolic CHF (EF 16% by echo 9/10, 45-50% by TEE 9/17) 5. Hypotension due to circulatory shock 6. Acute symptomatic anemia (?from oozing from R leg stump, vs red emesis but pt also had jellow) - receiving 2 u PRBCs 7. Acute renal failure with oliguria, question due to hypotension/hypovolemia 8. Electolyte disturbances: Hyperkalemia, hyponatremia, hypochloremia 9. Diabetes mellitus, uncontrolled 10. Moderate mitral regurgitation by TEE 07/03/13 11. Red emesis  Complex situation. Weight up 4 lbs since yesterday (31 lbs since 9/17), oliguric by I/o's, and Cr continuing to rise. Likely needs nephrology consult. PCCM correcting K. Given his anemia he does not currently appear to be a candidate for anticoagulation for his NSTEMI. Continue to cycle next set of troponin. Consider  maintenance ASA when OK with PCCM. Repeat 2D echo pending. Gastroccult emesis and hemoccult stools. No BB at present due to hypotension. Nursing reports that dopamine was able to be weaned. MD to follow.   Signed, Ronie Spies PA-C Agree with above assessment. Presently the patient has received two units of blood overnight. Repeat Hgb pending. He is alert this am and in no acute distress. Denies chest pain or acute dyspnea. EKG shows no acute changes. There is incomplete LBBB. Rhythm is NSR. Exam reveals decreased breath sounds at bases. No rales or  wheezing. Heart no murmur gallop or rub. Abdomen distended. Bowel sounds decreased. Extremities marked edema. Right leg stump dressed and shows no evidence of active bleeding. Plan: As above.

## 2013-07-15 NOTE — Progress Notes (Signed)
Critical K= 7.5, Dr Craige Cotta notified, new order received. Pt also thew up large amount of red emesis. Pt had red rasberry jello yesterday.  Critical troponin called to Dr Craige Cotta and  Eugenie Norrie.   Spoke with pt's wife and gave her an update. Stated would be in to see Pt shortly. Will cont to monitor closely.

## 2013-07-15 NOTE — Progress Notes (Signed)
Name: Albert [name] MRN: [id number] DOB: Jul 23, 1961    LOS: 1  Referring Provider:  Dr/ paya Reason for Referral:  Hypoxemia and hypotension  PULMONARY / CRITICAL CARE MEDICINE  HPI:  Albert [name] is a 52 year old male with past medical history significant for diabetes, CHF, and recent BKA for staph osteomyelitis, who is transferred from rehabilitation to the step down unit for hypotension earlier today.  PCCM was consulted for worsening hypoxemia.  Albert [name] has been followed by cardiology since admission for three-vessel coronary artery disease which potentially require CABG.  This has been delayed to allow adequate treatment of his current infection.  Over the last 3 days he has had worsening hypotension and shortness of breath.  This was initially attributed to worsening heart failure and volume overload.  He has been diuresed with Lasix however remains volume overloaded.  On the night of September 26 he had a fall dating back into his bed.  At that time his wound dehisced and he developed bleeding from his wound requiring multiple dressing changes.  On the evening of 9/28 he had an episode of worsening shortness of breath after fluid bolus for hypotension.  Chest x-ray shows bilateral large pleural effusions as well as pulmonary edema.  A CBC was obtained after transfer to PCCM, hemoglobin of 5.7.  Brief patient description:  52 year-old status post BKA for gangrenous foot and staph osteomyelitis + bacteremia, acute CHF, symptomatic anemia.  Events Since Admission: Transferred from rehabilitation on 07/14/2013 Transferred to PCCM and on 07/14/2013  Current Status: Improved, dopa weaned to off  Renal failure worsening  Vital Signs: Temp:  [94.2 F (34.6 C)-98.3 F (36.8 C)] 97.6 F (36.4 C) (09/29 0715) Pulse Rate:  [79-106] 98 (09/29 0715) Resp:  [11-31] 12 (09/29 1045) BP: (68-131)/(13-89) 112/83 mmHg (09/29 1045) SpO2:  [83 %-100 %] 100 % (09/29 1045) FiO2 (%):  [28 %] 28 % (09/28  2306) Weight:  [134 kg (295 lb 6.7 oz)-136 kg (299 lb 13.2 oz)] 136 kg (299 lb 13.2 oz) (09/29 0500)  Physical Examination: General:  Obese male, laying in bed, comfortable Neuro:  Alert and oriented x3, CN II through XII intact HEENT:  Pupils equally round and reactive to light, extraocular muscles intact, OP clear Neck:  Supple, no masses or lesions, JVD to the ear Cardiovascular:  Tachycardic, regular rate, no murmurs rubs or gallops Lungs:  Lung sounds significantly diminished bilaterally more so on the right, faint expiratory wheeze anteriorly apically Abdomen:  Obese, nontender nondistended, positive bowel sounds Musculoskeletal:  4+ pitting edema, no clubbing Skin:  Bruising over left knee , dressings clean and intact  Principal Problem:   Anemia Active Problems:   Gangrene   Diabetes mellitus out of control   Shock circulatory   NSTEMI (non-ST elevated myocardial infarction)   Coronary atherosclerosis of native coronary artery   Cardiomyopathy, ischemic   Hx of BKA   Acute pulmonary edema   Hypoxemia   Acute systolic heart failure   Acute kidney failure   Hyperkalemia   Hyposmolality and/or hyponatremia   Acute respiratory failure   Mitral regurgitation   Oliguria   Bacteremia due to Staphylococcus aureus   Hyperchloremia   ASSESSMENT AND PLAN  PULMONARY  Recent Labs Lab 07/14/13 2237  PHART 7.375  PCO2ART 33.1*  PO2ART 133.0*  HCO3 18.9*  O2SAT 99.9   Ventilator Settings: Vent Mode:  [-]  FiO2 (%):  [28 %] 28 % CXR:  Pulmonary edema, bilateral pleural effusions greater on right  ETT:  None  A:  Respiratory distress secondary to anemia, pulmonary edema and bilateral pleural effusions P:   Improved with cardiovascular stabilization Wean oxygen as able Diuresis for pleural effusions and pulmonary edema.  CARDIOVASCULAR  Recent Labs Lab 07/11/13 1630 07/11/13 2300 07/12/13 0510 07/14/13 2340 07/15/13 0430  TROPONINI <0.30 <0.30 <0.30 0.58*  3.82*  PROBNP 23575.0*  --   --   --   --    ECG:  Sinus tachycardia Lines: PICC, peripheral IV  A: Acute congestive heart failure P:  Secondary to volume overload and anemia Diurese with Lasix after transfusion Daily BMP Cardiology following  RENAL  Recent Labs Lab 07/13/13 1250 07/14/13 0730 07/14/13 2000 07/15/13 0430 07/15/13 0920  NA 131* 128* 131* 127* 128*  K 5.0 5.9* 5.9* 7.5* 6.9*  CL 99 97 99 94* 95*  CO2 27 23 23  17* 20  BUN 67* 72* 75* 81* 83*  CREATININE 1.25 1.48* 1.77* 1.84* 2.28*  CALCIUM 7.4* 7.4* 7.5* 7.9* 7.9*   Intake/Output     09/28 0701 - 09/29 0700 09/29 0701 - 09/30 0700   I.V. (mL/kg) 223.3 (1.6) 52.6 (0.4)   Blood 350 350   Other 50    Total Intake(mL/kg) 623.3 (4.6) 402.6 (3)   Urine (mL/kg/hr) 200 60 (0.1)   Total Output 200 60   Net +423.3 +342.6        Emesis Occurrence 1 x     Foley:  Placed 07/14/2013  A:  Acute renal failure Severe hyperkalemia P:   Presume ATN secondary to anemia and CHF Will require volume removal for improvement of CHF and forward flow Will ask nephrology to see regarding potential need HD due to hyperkalemia and total body volume overload  GASTROINTESTINAL No results found for this basename: AST, ALT, ALKPHOS, BILITOT, PROT, ALBUMIN,  in the last 168 hours  A:   Nausea P:   N.p.o. for now  HEMATOLOGIC  Recent Labs Lab 07/11/13 1050 07/14/13 2340 07/15/13 0920  HGB 8.2* 5.7* 7.6*  HCT 25.1* 18.2* 22.5*  PLT 448* 363 217  INR  --  1.21  --   APTT  --  32  --    A:  Anemia  P:  Likely secondary to acute bleeding after fall Transfused 2 units, recheck Hgb pm and 9/30  INFECTIOUS  Recent Labs Lab 07/11/13 1050 07/14/13 2340 07/15/13 0920  WBC 10.7* 14.7* 14.2*   Cultures: No new cultures Antibiotics: Currently on cefepime, vancomycin on hold given recent trough of 30  A:  History of MSSA infection P:   Previously on Ancef, start date 06/29/2013 Date 07/26/2013 Transition to  vanco and cefepime w concern for pneumonia Patient afebrile with no cough or sputum production, consider transitioning back to Ancef on 9/30   ENDOCRINE  Recent Labs Lab 07/14/13 1107 07/14/13 1637 07/14/13 2112 07/14/13 2354 07/15/13 0400  GLUCAP 197* 254* 189* 280* 235*   A:  Diabetes   P:   Patient on sliding scale insulin Will likely also need long-acting insulin   NEUROLOGIC  A:  No current problems P:     BEST PRACTICE / DISPOSITION Level of Care:  ICU Primary Service:  PC CM Consultants:  Orthopedics, cardiology, infectious disease Code Status:  Full Diet:  N.p.o. DVT Px:  Heparin GI Px:  PPI Skin Integrity:  Good Social / Family:  Wife updated 9/28  CC time 30 minutes   Leslye Peer., M.D. Pulmonary and Critical Care Medicine [facility] Pager: [phone]  07/15/2013, 11:03 AM

## 2013-07-16 DIAGNOSIS — D649 Anemia, unspecified: Secondary | ICD-10-CM

## 2013-07-16 DIAGNOSIS — I5021 Acute systolic (congestive) heart failure: Secondary | ICD-10-CM

## 2013-07-16 DIAGNOSIS — J810 Acute pulmonary edema: Secondary | ICD-10-CM

## 2013-07-16 LAB — GLUCOSE, CAPILLARY
Glucose-Capillary: 171 mg/dL — ABNORMAL HIGH (ref 70–99)
Glucose-Capillary: 171 mg/dL — ABNORMAL HIGH (ref 70–99)
Glucose-Capillary: 204 mg/dL — ABNORMAL HIGH (ref 70–99)
Glucose-Capillary: 211 mg/dL — ABNORMAL HIGH (ref 70–99)

## 2013-07-16 LAB — COMPREHENSIVE METABOLIC PANEL
ALT: 656 U/L — ABNORMAL HIGH (ref 0–53)
Albumin: 2.3 g/dL — ABNORMAL LOW (ref 3.5–5.2)
BUN: 60 mg/dL — ABNORMAL HIGH (ref 6–23)
Calcium: 7.4 mg/dL — ABNORMAL LOW (ref 8.4–10.5)
Creatinine, Ser: 1.58 mg/dL — ABNORMAL HIGH (ref 0.50–1.35)
GFR calc Af Amer: 57 mL/min — ABNORMAL LOW (ref >90)
GFR calc non Af Amer: 49 mL/min — ABNORMAL LOW (ref >90)
Glucose, Bld: 166 mg/dL — ABNORMAL HIGH (ref 70–99)
Potassium: 5.5 mEq/L — ABNORMAL HIGH (ref 3.5–5.1)
Sodium: 132 mEq/L — ABNORMAL LOW (ref 135–145)
Total Protein: 5.6 g/dL — ABNORMAL LOW (ref 6.0–8.3)

## 2013-07-16 LAB — VANCOMYCIN, RANDOM: Vancomycin Rm: 15.9 ug/mL

## 2013-07-16 LAB — RENAL FUNCTION PANEL
Albumin: 2.4 g/dL — ABNORMAL LOW (ref 3.5–5.2)
Calcium: 7.1 mg/dL — ABNORMAL LOW (ref 8.4–10.5)
Creatinine, Ser: 1.41 mg/dL — ABNORMAL HIGH (ref 0.50–1.35)
GFR calc Af Amer: 66 mL/min — ABNORMAL LOW (ref >90)
Glucose, Bld: 191 mg/dL — ABNORMAL HIGH (ref 70–99)
Phosphorus: 3.2 mg/dL (ref 2.3–4.6)
Potassium: 5.3 mEq/L — ABNORMAL HIGH (ref 3.5–5.1)
Sodium: 132 mEq/L — ABNORMAL LOW (ref 135–145)

## 2013-07-16 LAB — CBC
Hemoglobin: 7.6 g/dL — ABNORMAL LOW (ref 13.0–17.0)
MCHC: 33.5 g/dL (ref 30.0–36.0)
Platelets: 190 10*3/uL (ref 150–400)
RBC: 2.79 MIL/uL — ABNORMAL LOW (ref 4.22–5.81)
WBC: 13.1 10*3/uL — ABNORMAL HIGH (ref 4.0–10.5)

## 2013-07-16 LAB — PHOSPHORUS: Phosphorus: 3.5 mg/dL (ref 2.3–4.6)

## 2013-07-16 LAB — MAGNESIUM: Magnesium: 2.4 mg/dL (ref 1.5–2.5)

## 2013-07-16 MED ORDER — VANCOMYCIN HCL 10 G IV SOLR
1250.0000 mg | INTRAVENOUS | Status: DC
  Administered 2013-07-16: 1250 mg via INTRAVENOUS
  Filled 2013-07-16 (×2): qty 1250

## 2013-07-16 NOTE — Progress Notes (Signed)
[facility] ROUNDING NOTE   Subjective:   Interval History: stable on CVVHDF  Objective:  Vital signs in last 24 hours:  Temp:  [97.4 F (36.3 C)-97.8 F (36.6 C)] 97.4 F (36.3 C) (09/30 0745) Resp:  [9-23] 23 (09/30 1000) BP: (82-116)/(22-85) 109/74 mmHg (09/30 1000) SpO2:  [97 %-100 %] 100 % (09/30 1000) Weight:  [135.7 kg (299 lb 2.6 oz)] 135.7 kg (299 lb 2.6 oz) (09/30 0500)  Weight change: 1.7 kg (3 lb 12 oz) Filed Weights   07/14/13 2315 07/15/13 0500 07/16/13 0500  Weight: 134 kg (295 lb 6.7 oz) 136 kg (299 lb 13.2 oz) 135.7 kg (299 lb 2.6 oz)    Intake/Output: I/O last 3 completed shifts: In: 1282.2 [I.V.:482.2; Blood:700; Other:50; IV Piggyback:50] Out: 965 [Urine:868; Other:96; Stool:1]   Intake/Output this shift:  Total I/O In: 265 [P.O.:140; I.V.:25; IV Piggyback:100] Out: 229 [Urine:30; Other:199]  Head: [place]/AT .  Neck: supple Lungs: Diminished at bases otherwise clear. Breathing is unlabored.  Heart: RRR Abdomen: Soft, non-tender, Msk: Strength and tone appear normal for age.  Extremities  S/p R BKA. SCDs in place.  Neuro: Alert and oriented X 3. Moves all extremities spontaneously.      Basic Metabolic Panel:  Recent Labs Lab 07/14/13 2000 07/15/13 0430 07/15/13 0920 07/15/13 1600 07/16/13 0430  NA 131* 127* 128* 132* 132*  K 5.9* 7.5* 6.9* 5.8* 5.5*  CL 99 94* 95* 100 100  CO2 23 17* 20 22 24   GLUCOSE 220* 273* 221* 184* 166*  BUN 75* 81* 83* 77* 60*  CREATININE 1.77* 1.84* 2.28* 1.96* 1.58*  CALCIUM 7.5* 7.9* 7.9* 7.7* 7.4*  MG  --   --   --   --  2.4  PHOS  --   --   --  4.3 3.5    Liver Function Tests:  Recent Labs Lab 07/15/13 1600 07/16/13 0430  AST  --  1016*  ALT  --  656*  ALKPHOS  --  95  BILITOT  --  0.6  PROT  --  5.6*  ALBUMIN 2.2* 2.3*   No results found for this basename: LIPASE, AMYLASE,  in the last 168 hours No results found for this basename: AMMONIA,  in the last 168  hours  CBC:  Recent Labs Lab 07/11/13 1050 07/14/13 2340 07/15/13 0920 07/15/13 1845 07/16/13 0430  WBC 10.7* 14.7* 14.2* 13.3* 13.1*  NEUTROABS  --  11.1*  --   --   --   HGB 8.2* 5.7* 7.6* 7.7* 7.6*  HCT 25.1* 18.2* 22.5* 23.0* 22.7*  MCV 80.4 82.4 81.2 81.6 81.4  PLT 448* 363 217 218 190    Cardiac Enzymes:  Recent Labs Lab 07/11/13 2300 07/12/13 0510 07/14/13 2340 07/15/13 0430 07/15/13 1120  CKTOTAL  --   --  288*  --   --   CKMB  --   --  10.0*  --   --   TROPONINI <0.30 <0.30 0.58* 3.82* >20.00*    BNP: No components found with this basename: POCBNP,   CBG:  Recent Labs Lab 07/15/13 1601 07/15/13 1926 07/15/13 2348 07/16/13 0407 07/16/13 0745  GLUCAP 177* 165* 163* 171* 151*    Microbiology: Results for orders placed during the hospital encounter of 07/03/13  URINE CULTURE     Status: None   Collection Time    07/11/13 12:46 PM      Result Value Range Status   Specimen Description URINE, CATHETERIZED   Final   Special  Requests NONE   Final   Culture  Setup Time     Final   Value: 07/11/2013 13:36     Performed at [facility] Count     Final   Value: NO GROWTH     Performed at [facility]   Culture     Final   Value: NO GROWTH     Performed at [facility]   Report Status 07/12/2013 FINAL   Final  MRSA PCR SCREENING     Status: None   Collection Time    07/14/13  9:53 PM      Result Value Range Status   MRSA by PCR NEGATIVE  NEGATIVE Final   Comment:            The GeneXpert MRSA Assay (FDA     approved for NASAL specimens     only), is one component of a     comprehensive MRSA colonization     surveillance program. It is not     intended to diagnose MRSA     infection nor to guide or     monitor treatment for     MRSA infections.    Coagulation Studies:  Recent Labs  07/14/13 2340  LABPROT 15.0  INR 1.21    Urinalysis:  Recent Labs  07/14/13 2349  COLORURINE YELLOW  LABSPEC  1.019  PHURINE 5.0  GLUCOSEU NEGATIVE  HGBUR NEGATIVE  BILIRUBINUR NEGATIVE  KETONESUR 15*  PROTEINUR NEGATIVE  UROBILINOGEN 0.2  NITRITE NEGATIVE  LEUKOCYTESUR TRACE*      Imaging: Dg Chest Port 1 View  07/15/2013   CLINICAL DATA:  Pulmonary edema.  IJ catheter insertion.  EXAM: PORTABLE CHEST - 1 VIEW  COMPARISON:  Chest x-ray dated 07/14/2013  FINDINGS: Right IJ catheter has been inserted and the tip is at the superior vena cava adjacent to the azygos vein. No pneumothorax. Improved bilateral pulmonary edema and pleural effusions. Heart size and vascularity are now normal.  IMPRESSION: IJ catheter tip at the level of the azygos vein in the superior vena cava. Improving pulmonary edema and effusions.   Electronically Signed   By: Geanie Cooley   On: 07/15/2013 17:53   Dg Chest Port 1 View  07/14/2013   CLINICAL DATA:  Hypoxemia, low blood pressure, respiratory distress.  EXAM: PORTABLE CHEST - 1 VIEW  COMPARISON:  07/12/2013  FINDINGS: Diffuse perihilar and scratch head diffuse bilateral airspace opacities compatible with edema. Heart is borderline in size. Suspect layering effusions. No acute bony abnormality.  IMPRESSION: Moderate pulmonary edema and layering effusions.   Electronically Signed   By: Charlett Nose M.D.   On: 07/14/2013 22:34     Medications:   . sodium chloride 10 mL/hr at 07/15/13 1721  . dialysis replacement fluid (prismasate) 200 mL/hr at 07/15/13 1705  . dialysis replacement fluid (prismasate) 300 mL/hr at 07/15/13 1705  . dialysate (PRISMASATE) 2,000 mL/hr at 07/16/13 0939   . antiseptic oral rinse  15 mL Mouth Rinse BID  . ceFEPime (MAXIPIME) IV  2 g Intravenous Q12H  . famotidine (PEPCID) IV  20 mg Intravenous Q12H  . insulin aspart  0-15 Units Subcutaneous Q4H  . sodium chloride  3 mL Intravenous Q12H   heparin, ondansetron, sodium chloride  Assessment/ Plan:  1. Acute kidney injury: patient with previously normal renal function. Now with hypotension  potentially related to infection (had staph osteo, now with elevated WBC after having trended toward normal) vs cardiac cause (  CHF with volume overload and rising troponin following recent NSTEMI) leading to poor perfusion of the kidneys and likely hypotensive ATN cause of AKI.  - will continue CRRT today given volume overload, worsening renal function, and hyperkalemia with goal of keeping even at this time given hypotensive BPs  - continue to follow UOP and volume status to determine need for CRRT and amount of volume to be removed  - will monitor BID renal panels  - avoid nephrotoxic agents  2. Hyperkalemia: resoving - improving with CRRT - hold ACEI  3. Anemia: baseline appears to be around 12. Had progressive decline in hgb following surgery to a low of 5.7. Now 7.6 s/p transfusion 2 units. Likely related to recent surgery and dilutional with volume overload.  - will continue to monitor at this time  4. CV: currently hypotensive and volume overloaded with 3+ pitting edema and up 30 lbs since pre-op with likely CHF exacerbation given BNP of ~23000. Patient was on lisinopril and metoprolol  - hold home BP meds  - will manage volume through CRRT once pressures can tolerate volume removal-at this time will keep even  - monitor daily weights and I/O's  5. DM: on metformin/glyburide and levemir as outpatient.  - continue SSI per primary team  Will continue treatment with CVVHDF   LOS: 2 Alethea Terhaar W @TODAY @10 :52 AM

## 2013-07-16 NOTE — Progress Notes (Signed)
ANTIBIOTIC CONSULT NOTE - FOLLOW UP  Pharmacy Consult for Vancomycin and Cefepime Indication: pneumonia  Allergies  Allergen Reactions  . Amoxicillin Nausea And Vomiting    Patient Measurements: Height: 5\' 10"  (177.8 cm) Weight: 299 lb 2.6 oz (135.7 kg) IBW/kg (Calculated) : 73  Vital Signs: Temp: 97.6 F (36.4 C) (09/30 1136) Temp src: Oral (09/30 1136) BP: 118/90 mmHg (09/30 1400) Intake/Output from previous day: 09/29 0701 - 09/30 0700 In: 658.9 [I.V.:258.9; Blood:350; IV Piggyback:50] Out: 765 [Urine:668; Stool:1] Intake/Output from this shift: Total I/O In: 300 [P.O.:140; I.V.:60; IV Piggyback:100] Out: 378 [Urine:75; Other:303]  Labs:  Recent Labs  07/15/13 0920 07/15/13 1600 07/15/13 1845 07/16/13 0430  WBC 14.2*  --  13.3* 13.1*  HGB 7.6*  --  7.7* 7.6*  PLT 217  --  218 190  CREATININE 2.28* 1.96*  --  1.58*   Estimated Creatinine Clearance: 77.6 ml/min (by C-G formula based on Cr of 1.58).  Recent Labs  07/14/13 0700 07/16/13 0430  VANCOTROUGH 30.0*  --   VANCORANDOM  --  15.9    Assessment:  Transferred from Rehab unit to CCU overnight 9/29  Vancomycin and Cefepime begun 9/26.  Vancomycin trough level of 30 mcg/ml on 9/28 at 7am, dose adjusted from 1250 mg to 750 mg IV q12h.  Last dose given ~6pm on 9/28. CRRT begun 9/29 pm. Random vancomycin level = 15.9 mcg/ml today. OK to resume vanc.    Previously on Ancef for MSSA bacteremia per ID - begun on 9/13, and plans were to continue for 4 weeks (thru 07/26/13). Ancef as dc'd on 9/26, when therapy was broadened to Vanc/Cefepime.  Ancef to resume if current regimen is stopped before 07/26/13.  Goal of Therapy:  appropriate Cefepime dose for renal function and infection Vancomycin troughs ~15 mcg/ml  Plan:   Vancomycin 1250 mg IV q24hrs while on CRRT.  Cefepime 2 grams IV q12h while on CRRT.  Will follow renal function and CRRT plans  Follow up antibiotic plans.   Dennie Fetters,  RPh Pager: [phone] 07/16/2013,2:37 PM

## 2013-07-16 NOTE — Discharge Summary (Signed)
NAME:  Albert [name], Albert [name] NO.:  [id number]  MEDICAL RECORD NO.:  [id number]  LOCATION:  2H02C                        FACILITY:  [facility]  PHYSICIAN:  Erick Colace, M.D.DATE OF BIRTH:  Feb 25, 1961  DATE OF ADMISSION:  07/03/2013 DATE OF DISCHARGE:  07/14/2013                              DISCHARGE SUMMARY   DISCHARGE DIAGNOSES: 1. Right below-knee amputation due to Charcot deformity. 2. History of prior left cerebrovascular accident with right-sided     weakness.  Sequential compression devices for DVT prophylaxis, pain     management, coronary artery disease - non-ST elevation myocardial     infarction 3. Infectious disease with sepsis - MSSA bacteremia. 4. Diabetes mellitus. 5. Peripheral neuropathy. 6. Hypertension. 7. Acute blood loss anemia. 8. Recent multi toe left foot amputation.  HISTORY OF PRESENT ILLNESS:  This is a 52 year old right-handed male, history of uncontrolled diabetes mellitus as well as Charcot foot right lower extremity, multiple amputations left foot February, 2014. Admitted June 25, 2013, with nonhealing ulcer to the right foot and Charcot collapse of right foot.  Underwent irrigation and debridement of right plantar abscess, June 25, 2013, per Dr. Magnus Ivan. Postoperative hypotension, tachycardia.  Cardiology Services followup, noted elevated troponin level of 10.  Echocardiogram with distal septal hypokinesis.  Elevated troponin levels, felt likely underlying sepsis. Underwent cardiac catheterization on June 28, 2013.  Findings of triple-vessel coronary artery disease - NSTEMI, secondary to demand ischemia, mild LV systolic dysfunction.  The patient placed on medical management aspirin therapy.  Ongoing ischemic changes, right lower extremity.  Underwent right below-knee amputation June 29, 2013, per Dr. Lajoyce Corners.  Cardiothoracic surgery follow up considering CABG after recovery from below-knee amputation.  TEE  completed July 03, 2013, showing no vegetation with ejection fraction 50%.  The patient maintained on Ancef therapy per infectious disease for sepsis - MSSA bacteremia with plan for 28-day duration.  The patient was admitted for comprehensive rehab program.  PAST MEDICAL HISTORY:  See discharge diagnoses.  SOCIAL HISTORY:  Lives with spouse.  FUNCTIONAL HISTORY:  Prior to admission independent with assistive device.  Functional status upon admission to rehab services was moderate assist for lateral scoot transfers.  PHYSICAL EXAMINATION:  VITAL SIGNS:  Blood pressure 124/93, pulse 116, temperature 97, respirations 17. GENERAL:  This was an alert male, in no acute distress. HEENT:  Pupils round and reactive to light.  Right amputation site was dressed with immediate postoperative compression wrap.  Left foot dressing in place and recent toe amputations. LUNGS:  Clear to auscultation. CARDIAC:  Regular rate and rhythm. ABDOMEN:  Soft, nontender.  Good bowel sounds.  REHABILITATION HOSPITAL COURSE:  The patient was admitted to inpatient rehab services with therapies initiated on a 3-hour daily basis consisting of physical therapy, occupational therapy, and rehabilitation nursing.  The following issues were addressed during the patient's rehabilitation stay.  Pertaining to Mr. Gilreath right below-knee amputation, surgical site was healing nicely.  Noted on the evening of July 14, 2013, the patient with fall, getting back in the bed with dehiscence of wound, requiring multiple dressings as well as episode of worsening shortness of breath with hospital course established coronary artery disease with CAD.  Ultimate plan was for CABG once recovered from below-knee amputation.  The patient had been followed by Critical Care as well as Cardiology Services.  He had remained on aspirin therapy.  He had been on Ancef for sepsis MSSA bacteremia, however, recent antibiotic changes due  to question HCAT and monitored with plan follow up infectious disease for appropriate antibiotic care.  The patient progressing with ongoing medical issues, worsening shortness of breath. He had received fluid boluses, diuresis.  Chest x-ray showed bilateral large pleural effusions as well as pulmonary edema.  His hemoglobin dropped to 5.7 after dehiscence of wound and ongoing medical management. The patient was discharged to Acute Care Services for ongoing medical management.  All medication changes were made at the discretion of critical care at that time.  Arrangements had been made for transfusion.     Mariam Dollar, P.A.   ______________________________ Erick Colace, M.D.    DA/MEDQ  D:  07/15/2013  T:  07/16/2013  Job:  [id number]

## 2013-07-16 NOTE — Progress Notes (Signed)
Subjective:  The patient is undergoing CVVHD.  He denies any chest discomfort or increased dyspnea.  Rhythm remains normal sinus rhythm.  He had a normal appearing bowel movement this morning.  Objective:  Vital Signs in the last 24 hours: Temp:  [97.4 F (36.3 C)-97.8 F (36.6 C)] 97.4 F (36.3 C) (09/30 0745) Resp:  [9-19] 12 (09/30 0800) BP: (78-116)/(22-85) 103/73 mmHg (09/30 0800) SpO2:  [97 %-100 %] 100 % (09/30 0800) Weight:  [299 lb 2.6 oz (135.7 kg)] 299 lb 2.6 oz (135.7 kg) (09/30 0500)  Intake/Output from previous day: 09/29 0701 - 09/30 0700 In: 658.9 [I.V.:258.9; Blood:350; IV Piggyback:50] Out: 765 [Urine:668; Stool:1] Intake/Output from this shift: Total I/O In: 60 [I.V.:10; IV Piggyback:50] Out: 8 [Other:8]  . antiseptic oral rinse  15 mL Mouth Rinse BID  . ceFEPime (MAXIPIME) IV  2 g Intravenous Q12H  . famotidine (PEPCID) IV  20 mg Intravenous Q12H  . insulin aspart  0-15 Units Subcutaneous Q4H  . sodium chloride  3 mL Intravenous Q12H   . sodium chloride 10 mL/hr at 07/15/13 1721  . dialysis replacement fluid (prismasate) 200 mL/hr at 07/15/13 1705  . dialysis replacement fluid (prismasate) 300 mL/hr at 07/15/13 1705  . dialysate (PRISMASATE) 2,000 mL/hr at 07/16/13 4540    Physical Exam: General: Well developed WM in no acute distress, appearing chronically ill.  Nasal oxygen in place, not tachypneic  Head: Normocephalic, atraumatic, sclera non-icteric, no xanthomas, nares are without discharge.  Neck: JVD moderately elevated.  Lungs: Diminished at bases otherwise clear. Breathing is unlabored.  Heart: RRR S1 S2 without murmurs, rubs, or gallops.  Abdomen: Soft, non-tender, rounded with normoactive bowel sounds. No hepatomegaly. No rebound/guarding. No obvious abdominal masses.  Msk: Strength and tone appear normal for age.  Extremities: No clubbing or cyanosis. Significant edema LLE with cluster of petechial appearing spots above L knee. S/p R BKA.  SCDs in place.  Neuro: Alert and oriented X 3. Moves all extremities spontaneously.  Psych: Responds to questions appropriately with a normal affect.      Recent Labs  07/15/13 1845 07/16/13 0430  WBC 13.3* 13.1*  HGB 7.7* 7.6*  PLT 218 190    Recent Labs  07/15/13 1600 07/16/13 0430  NA 132* 132*  K 5.8* 5.5*  CL 100 100  CO2 22 24  GLUCOSE 184* 166*  BUN 77* 60*  CREATININE 1.96* 1.58*    Recent Labs  07/15/13 0430 07/15/13 1120  TROPONINI 3.82* >20.00*   Hepatic Function Panel  Recent Labs  07/16/13 0430  PROT 5.6*  ALBUMIN 2.3*  AST 1016*  ALT 656*  ALKPHOS 95  BILITOT 0.6   No results found for this basename: CHOL,  in the last 72 hours No results found for this basename: PROTIME,  in the last 72 hours  Imaging: Dg Chest Port 1 View  07/15/2013   CLINICAL DATA:  Pulmonary edema.  IJ catheter insertion.  EXAM: PORTABLE CHEST - 1 VIEW  COMPARISON:  Chest x-ray dated 07/14/2013  FINDINGS: Right IJ catheter has been inserted and the tip is at the superior vena cava adjacent to the azygos vein. No pneumothorax. Improved bilateral pulmonary edema and pleural effusions. Heart size and vascularity are now normal.  IMPRESSION: IJ catheter tip at the level of the azygos vein in the superior vena cava. Improving pulmonary edema and effusions.   Electronically Signed   By: Geanie Cooley   On: 07/15/2013 17:53   Dg Chest Port 1 [address]  07/14/2013   CLINICAL DATA:  Hypoxemia, low blood pressure, respiratory distress.  EXAM: PORTABLE CHEST - 1 VIEW  COMPARISON:  07/12/2013  FINDINGS: Diffuse perihilar and scratch head diffuse bilateral airspace opacities compatible with edema. Heart is borderline in size. Suspect layering effusions. No acute bony abnormality.  IMPRESSION: Moderate pulmonary edema and layering effusions.   Electronically Signed   By: Charlett Nose M.D.   On: 07/14/2013 22:34    Cardiac Studies: 2-D echo on 07/15/13: Study Conclusions  - Left ventricle:  The cavity size was normal. Wall thickness was increased in a pattern of moderate LVH. Systolic function was mildly to moderately reduced. The estimated ejection fraction was in the range of 40% to 45%. Doppler parameters are consistent with restrictive physiology, indicative of decreased left ventricular diastolic compliance and/or increased left atrial pressure. - Left atrium: The atrium was mildly dilated. - Pulmonary arteries: Systolic pressure was mildly increased. - Pericardium, extracardiac: A small pericardial effusion was identified. Impressions:  - Extremely limited due to poor sound wave transmission; there appears to be hypokinesis of the inferoposterior wall with mild to moderate reduction in LV function; suggest TEE or cardiac MRI if clinically indicated.  Assessment/Plan:    LOS: 2 days  1. Recent sepsis/MSSA bacteremia (adm 9/9->9/17->CIR) from gangrenous R foot s/p R BKA 06/29/13, TEE negative for bacteremia 07/03/13  2. 3-vessel CAD by cath 06/28/13 (inital plan was for CABG once recovered from BKA), now with 2nd NSTEMI (peak troponin 15 on 06/26/13 that had normalized, yesterday troponin greater than 20. NSTEMI secondary to demand ischemia. 3. Acute hypoxic respiratory failure secondary to anemia, pulmonary edema, and bilateral pleural effusions  4. Ischemic cardiomyopathy with acute systolic CHF (EF 16-10% by echo on 07/15/13. 5. Hypotension due to circulatory shock, now stable off pressors.  6. Acute symptomatic anemia.  Hemoglobin stable overnight 7. Acute renal failure with Ammie Dalton, now on CVVHD 8. Electolyte disturbances: Hyperkalemia improving 9. Diabetes mellitus, uncontrolled  10. Moderate mitral regurgitation by TEE 07/03/13   Plan: Obtain followup troponin today.   Albert [name] 07/16/2013, 8:12 AM

## 2013-07-16 NOTE — Progress Notes (Signed)
Name: Albert [name] MRN: [id number] DOB: [birth year]    LOS: 2  Referring Provider:  Dr/ paya Reason for Referral:  Hypoxemia and hypotension  PULMONARY / CRITICAL CARE MEDICINE  HPI:  Albert [name] is a 52 year old male with past medical history significant for diabetes, CHF, and recent BKA for staph osteomyelitis, who is transferred from rehabilitation to the step down unit for hypotension earlier today.  PCCM was consulted for worsening hypoxemia.  Albert [name] has been followed by cardiology since admission for three-vessel coronary artery disease which potentially require CABG.  This has been delayed to allow adequate treatment of his current infection.  Over the last 3 days he has had worsening hypotension and shortness of breath.  This was initially attributed to worsening heart failure and volume overload.  He has been diuresed with Lasix however remains volume overloaded.  On the night of September 26 he had a fall dating back into his bed.  At that time his wound dehisced and he developed bleeding from his wound requiring multiple dressing changes.  On the evening of 9/28 he had an episode of worsening shortness of breath after fluid bolus for hypotension.  Chest x-ray shows bilateral large pleural effusions as well as pulmonary edema.  A CBC was obtained after transfer to PCCM, hemoglobin of 5.7.  Brief patient description:  52 year-old status post BKA for gangrenous foot and staph osteomyelitis + bacteremia, acute CHF, symptomatic anemia.  Events Since Admission: Transferred from rehabilitation on 07/14/2013 Transferred to PCCM and on 07/14/2013 Started CVVHD 9/29  Current Status: CVVHD started 9/29 pm  Vital Signs: Temp:  [97.4 F (36.3 C)-97.8 F (36.6 C)] 97.6 F (36.4 C) (09/30 1136) Resp:  [9-23] 11 (09/30 1100) BP: (89-116)/(55-85) 103/80 mmHg (09/30 1100) SpO2:  [97 %-100 %] 100 % (09/30 1100) Weight:  [135.7 kg (299 lb 2.6 oz)] 135.7 kg (299 lb 2.6 oz) (09/30  0500)  Physical Examination: General:  Obese male, laying in bed, comfortable Neuro:  Alert and oriented x3, CN II through XII intact HEENT:  Pupils equally round and reactive to light, extraocular muscles intact, OP clear Neck:  Supple, no masses or lesions, JVD to the ear Cardiovascular:  Tachycardic, regular rate, no murmurs rubs or gallops Lungs:  Lung sounds significantly diminished bilaterally more so on the right, faint expiratory wheeze anteriorly apically Abdomen:  Obese, nontender nondistended, positive bowel sounds Musculoskeletal:  4+ pitting edema, no clubbing Skin:  Bruising over left knee , dressings clean and intact  Principal Problem:   Anemia Active Problems:   Gangrene   Diabetes mellitus out of control   Shock circulatory   NSTEMI (non-ST elevated myocardial infarction)   Coronary atherosclerosis of native coronary artery   Cardiomyopathy, ischemic   Hx of BKA   Acute pulmonary edema   Hypoxemia   Acute systolic heart failure   Acute kidney failure   Hyperkalemia   Hyposmolality and/or hyponatremia   Acute respiratory failure   Mitral regurgitation   Oliguria   Bacteremia due to Staphylococcus aureus   Hyperchloremia   ASSESSMENT AND PLAN  PULMONARY  Recent Labs Lab 07/14/13 2237  PHART 7.375  PCO2ART 33.1*  PO2ART 133.0*  HCO3 18.9*  O2SAT 99.9   Ventilator Settings:   CXR:  Pulmonary edema, bilateral pleural effusions greater on right  ETT:  None  A:  Respiratory distress secondary to anemia, pulmonary edema and bilateral pleural effusions P:   Improved with cardiovascular stabilization Wean oxygen as able Volume removal w CVVHD  CARDIOVASCULAR  Recent Labs Lab 07/11/13 1630  07/12/13 0510 07/14/13 2340 07/15/13 0430 07/15/13 1120 07/16/13 0900  TROPONINI <0.30  < > <0.30 0.58* 3.82* >20.00* 14.50*  PROBNP 23575.0*  --   --   --   --   --   --   < > = values in this interval not displayed. ECG:  Sinus tachycardia Lines:  PICC, peripheral IV  A: Acute congestive heart failure P:  Secondary to volume overload and anemia Volume off w CVVHD Daily BMP Cardiology following  RENAL  Recent Labs Lab 07/14/13 2000 07/15/13 0430 07/15/13 0920 07/15/13 1600 07/16/13 0430  NA 131* 127* 128* 132* 132*  K 5.9* 7.5* 6.9* 5.8* 5.5*  CL 99 94* 95* 100 100  CO2 23 17* 20 22 24   BUN 75* 81* 83* 77* 60*  CREATININE 1.77* 1.84* 2.28* 1.96* 1.58*  CALCIUM 7.5* 7.9* 7.9* 7.7* 7.4*  MG  --   --   --   --  2.4  PHOS  --   --   --  4.3 3.5   Intake/Output     09/29 0701 - 09/30 0700 09/30 0701 - 10/01 0700   P.O.  140   I.V. (mL/kg) 258.9 (1.9) 30 (0.2)   Blood 350    Other     IV Piggyback 50 100   Total Intake(mL/kg) 658.9 (4.9) 270 (2)   Urine (mL/kg/hr) 668 (0.2) 50 (0.1)   Other 96 (0) 270 (0.4)   Stool 1 (0)    Total Output 765 320   Net -106.1 -50        Stool Occurrence 3 x     Foley:  Placed 07/14/2013  A:  Acute renal failure, ATN. Hopefully good prognosis for renal recovery Severe hyperkalemia P:   Presume ATN secondary to anemia and CHF Appreciate Renal's assistance w CVVHD  GASTROINTESTINAL  Recent Labs Lab 07/15/13 1600 07/16/13 0430  AST  --  1016*  ALT  --  656*  ALKPHOS  --  95  BILITOT  --  0.6  PROT  --  5.6*  ALBUMIN 2.2* 2.3*    A:   Nausea Transaminitis, likely shock liver P:   Follow LFT  Check hepatitis panel  HEMATOLOGIC  Recent Labs Lab 07/11/13 1050 07/14/13 2340 07/15/13 0920 07/15/13 1845 07/16/13 0430  HGB 8.2* 5.7* 7.6* 7.7* 7.6*  HCT 25.1* 18.2* 22.5* 23.0* 22.7*  PLT 448* 363 217 218 190  INR  --  1.21  --   --   --   APTT  --  32  --   --   --    A:  Anemia  P:  Likely secondary to acute bleeding after fall Transfused 2 units 9/29, follow CBC  INFECTIOUS  Recent Labs Lab 07/11/13 1050 07/14/13 2340 07/15/13 0920 07/15/13 1845 07/16/13 0430  WBC 10.7* 14.7* 14.2* 13.3* 13.1*   Cultures: No new  cultures Antibiotics: Currently on cefepime, vancomycin on hold given recent trough of 30  A:  History of MSSA infection P:   Previously on Ancef, start date 06/29/2013 Date 07/26/2013 Transition to vanco and cefepime w concern for pneumonia Patient afebrile with no cough or sputum production, consider transitioning back to Ancef on 10/1   ENDOCRINE  Recent Labs Lab 07/15/13 1926 07/15/13 2348 07/16/13 0407 07/16/13 0745 07/16/13 1138  GLUCAP 165* 163* 171* 151* 171*   A:  Diabetes   P:   Patient on sliding scale insulin Will likely also need long-acting insulin  NEUROLOGIC  A:  No current problems P:     BEST PRACTICE / DISPOSITION Level of Care:  ICU Primary Service:  PC CM Consultants:  Orthopedics, cardiology, infectious disease Code Status:  Full Diet:  Clears DVT Px:  Heparin GI Px:  PPI Skin Integrity:  Good Social / Family:  Wife updated 9/28   Levy Pupa, MD, PhD 07/16/2013, 12:12 PM [facility] [phone] or if no answer [phone]

## 2013-07-17 ENCOUNTER — Inpatient Hospital Stay (HOSPITAL_COMMUNITY): Payer: 59

## 2013-07-17 LAB — RENAL FUNCTION PANEL
Albumin: 2.3 g/dL — ABNORMAL LOW (ref 3.5–5.2)
Albumin: 2.4 g/dL — ABNORMAL LOW (ref 3.5–5.2)
CO2: 24 mEq/L (ref 19–32)
Calcium: 6.6 mg/dL — ABNORMAL LOW (ref 8.4–10.5)
Calcium: 6.9 mg/dL — ABNORMAL LOW (ref 8.4–10.5)
Chloride: 98 mEq/L (ref 96–112)
Creatinine, Ser: 1.71 mg/dL — ABNORMAL HIGH (ref 0.50–1.35)
GFR calc Af Amer: 66 mL/min — ABNORMAL LOW (ref >90)
GFR calc non Af Amer: 57 mL/min — ABNORMAL LOW (ref >90)
GFR calc non Af Amer: 45 mL/min — ABNORMAL LOW (ref >90)
Glucose, Bld: 162 mg/dL — ABNORMAL HIGH (ref 70–99)
Phosphorus: 2.9 mg/dL (ref 2.3–4.6)
Sodium: 132 mEq/L — ABNORMAL LOW (ref 135–145)

## 2013-07-17 LAB — CBC
MCH: 27.6 pg (ref 26.0–34.0)
MCV: 84 fL (ref 78.0–100.0)
Platelets: 197 10*3/uL (ref 150–400)
RBC: 2.75 MIL/uL — ABNORMAL LOW (ref 4.22–5.81)
RDW: 15.7 % — ABNORMAL HIGH (ref 11.5–15.5)

## 2013-07-17 LAB — HEPATIC FUNCTION PANEL
AST: 380 U/L — ABNORMAL HIGH (ref 0–37)
Bilirubin, Direct: 0.2 mg/dL (ref 0.0–0.3)
Indirect Bilirubin: 0.3 mg/dL (ref 0.3–0.9)
Total Bilirubin: 0.5 mg/dL (ref 0.3–1.2)

## 2013-07-17 LAB — GLUCOSE, CAPILLARY
Glucose-Capillary: 172 mg/dL — ABNORMAL HIGH (ref 70–99)
Glucose-Capillary: 166 mg/dL — ABNORMAL HIGH (ref 70–99)
Glucose-Capillary: 219 mg/dL — ABNORMAL HIGH (ref 70–99)
Glucose-Capillary: 253 mg/dL — ABNORMAL HIGH (ref 70–99)

## 2013-07-17 LAB — HEPATITIS PANEL, ACUTE
HCV Ab: NEGATIVE
Hep A IgM: NEGATIVE
Hep B C IgM: NEGATIVE

## 2013-07-17 MED ORDER — GUAIFENESIN-DM 100-10 MG/5ML PO SYRP
5.0000 mL | ORAL_SOLUTION | ORAL | Status: DC | PRN
  Administered 2013-07-17 (×3): 5 mL via ORAL
  Filled 2013-07-17 (×3): qty 5

## 2013-07-17 MED ORDER — FUROSEMIDE 10 MG/ML IJ SOLN
160.0000 mg | Freq: Four times a day (QID) | INTRAVENOUS | Status: DC
  Administered 2013-07-17 – 2013-07-25 (×32): 160 mg via INTRAVENOUS
  Filled 2013-07-17 (×37): qty 16

## 2013-07-17 MED ORDER — NITROGLYCERIN IN D5W 200-5 MCG/ML-% IV SOLN
2.0000 ug/min | INTRAVENOUS | Status: DC
  Administered 2013-07-17: 10 ug/min via INTRAVENOUS
  Filled 2013-07-17: qty 250

## 2013-07-17 MED ORDER — HYDRALAZINE HCL 20 MG/ML IJ SOLN
10.0000 mg | Freq: Four times a day (QID) | INTRAMUSCULAR | Status: DC
  Administered 2013-07-17 – 2013-07-20 (×11): 10 mg via INTRAVENOUS
  Filled 2013-07-17: qty 0.5
  Filled 2013-07-17: qty 1
  Filled 2013-07-17 (×7): qty 0.5
  Filled 2013-07-17: qty 1
  Filled 2013-07-17 (×4): qty 0.5
  Filled 2013-07-17: qty 1

## 2013-07-17 MED ORDER — DEXTROSE 5 % IV SOLN
2.0000 g | INTRAVENOUS | Status: DC
  Administered 2013-07-18 – 2013-07-19 (×2): 2 g via INTRAVENOUS
  Filled 2013-07-17 (×2): qty 2

## 2013-07-17 MED ORDER — ALPRAZOLAM 0.25 MG PO TABS
0.2500 mg | ORAL_TABLET | Freq: Two times a day (BID) | ORAL | Status: DC | PRN
  Administered 2013-07-17 – 2013-07-25 (×10): 0.25 mg via ORAL
  Filled 2013-07-17 (×10): qty 1

## 2013-07-17 MED ORDER — MORPHINE SULFATE 2 MG/ML IJ SOLN
1.0000 mg | Freq: Once | INTRAMUSCULAR | Status: AC
  Administered 2013-07-17: 2 mg via INTRAVENOUS
  Filled 2013-07-17: qty 1

## 2013-07-17 NOTE — Progress Notes (Signed)
eLink Physician-Brief Progress Note Patient Name: Albert [name] DOB: 09-Aug-1961 MRN: [id number]  Date of Service  07/17/2013   HPI/Events of Note  Acute pulmonary edema on CXR Increased dyspnea -satn ok on venti mask   eICU Interventions  IV NTG Hydralazine IV Morphine x 1 On high dose lasix   Intervention Category Major Interventions: Respiratory failure - evaluation and management  ALVA,RAKESH V. 07/17/2013, 5:13 PM

## 2013-07-17 NOTE — Progress Notes (Addendum)
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  Cultures: MRSA pCR 9/28: positive   Antibiotics: Ancef, start date 06/29/2013 Date 07/26/2013 Cefepime 9/26>>> vanc 9/26>>>   Events Since Admission: Transferred from rehabilitation on 07/14/2013 Transferred to PCCM and on 07/14/2013 Started CVVHD 9/29  Current Status: CVVHD started 9/29 pm  Vital Signs: Temp:  [97.2 F (36.2 C)-97.9 F (36.6 C)] 97.2 F (36.2 C) (10/01 0730) Resp:  [10-25] 19 (10/01 0800) BP: (103-126)/(71-97) 116/78 mmHg (10/01 0800) SpO2:  [100 %] 100 % (10/01 0800) Weight:  [135.1 kg (297 lb 13.5 oz)] 135.1 kg (297 lb 13.5 oz) (10/01 0500)  Physical Examination: General:  Obese male, laying in bed, comfortable Neuro:  Alert and oriented x3, CN II through XII intact HEENT:  Pupils equally round and reactive to light, extraocular muscles intact, OP clear Neck:  Supple, no masses or lesions, JVD to the ear Cardiovascular:  Tachycardic, regular rate, no murmurs rubs or gallops Lungs:  Lung sounds significantly diminished bilaterally more so on the right, faint expiratory wheeze anteriorly apically Abdomen:  Obese, nontender nondistended, positive bowel sounds Musculoskeletal:  4+ pitting edema, no clubbing Skin:  Bruising over left knee , dressings clean and intact  Principal Problem:   Anemia Active Problems:   Gangrene   Diabetes mellitus out of control   Shock circulatory   NSTEMI (non-ST elevated myocardial infarction)   Coronary atherosclerosis of native coronary artery   Cardiomyopathy, ischemic   Hx of BKA   Acute pulmonary edema   Hypoxemia   Acute systolic heart failure   Acute kidney failure   Hyperkalemia    Hyposmolality and/or hyponatremia   Acute respiratory failure   Mitral regurgitation   Oliguria   Bacteremia due to Staphylococcus aureus   Hyperchloremia   ASSESSMENT AND PLAN  PULMONARY  Recent Labs Lab 07/14/13 2237  PHART 7.375  PCO2ART 33.1*  PO2ART 133.0*  HCO3 18.9*  O2SAT 99.9   Ventilator Settings:   CXR:  Pulmonary edema, bilateral pleural effusions greater on right  ETT:  None  A:  Respiratory distress secondary to anemia, pulmonary edema and bilateral pleural effusions Improved with cardiovascular stabilization P:   Wean oxygen as able Volume removal w CVVHD  CARDIOVASCULAR  Recent Labs Lab 07/11/13 1630  07/12/13 0510 07/14/13 2340 07/15/13 0430 07/15/13 1120 07/16/13 0900  TROPONINI <0.30  < > <0.30 0.58* 3.82* >20.00* 14.50*  PROBNP 23575.0*  --   --   --   --   --   --   < > = values in this interval not displayed. ECG:  Sinus tachycardia Lines: PICC, peripheral IV  A: Acute congestive heart failure Secondary to volume overload and anemia P:  Volume off w CVVHD Daily BMP Cardiology following  RENAL  Recent Labs Lab 07/15/13 0920 07/15/13 1600 07/16/13 0430 07/16/13 1630 07/17/13 0445 07/17/13 0500  NA 128* 132* 132* 132* 132*  --   K 6.9* 5.8* 5.5* 5.3* 5.3*  --   CL 95* 100 100 99 100  --   CO2 20 22 24 23 24   --   BUN 83* 77* 60* 48* 41*  --   CREATININE 2.28* 1.96* 1.58* 1.41* 1.41*  --   CALCIUM 7.9* 7.7* 7.4* 7.1*  6.6*  --   MG  --   --  2.4  --   --  2.4  PHOS  --  4.3 3.5 3.2 2.9  --    Intake/Output     09/30 0701 - 10/01 0700 10/01 0701 - 10/02 0700   P.O. 510    I.V. (mL/kg) 233 (1.7) 25 (0.2)   Blood     IV Piggyback 350 50   Total Intake(mL/kg) 1093 (8.1) 75 (0.6)   Urine (mL/kg/hr) 243 (0.1) 5 (0)   Other 1633 (0.5) 121 (0.4)   Stool 3 (0)    Total Output 1879 126   Net -786 -51         Foley:  Placed 07/14/2013  A:   Acute renal failure, ATN.  Mild hyperkalemia  Hyperkalemia resolved.  Presume  ATN secondary to anemia and CHF P:  Appreciate Renal's assistance w CVVHD, trying diuresis per renal See anemia section   GASTROINTESTINAL  Recent Labs Lab 07/15/13 1600 07/16/13 0430 07/16/13 1630 07/17/13 0445 07/17/13 0500  AST  --  1016*  --   --  380*  ALT  --  656*  --   --  509*  ALKPHOS  --  95  --   --  96  BILITOT  --  0.6  --   --  0.5  PROT  --  5.6*  --   --  5.6*  ALBUMIN 2.2* 2.3* 2.4* 2.3* 2.3*    A:   Nausea Transaminitis, likely shock liver: looking better  P:   Follow LFT  Check hepatitis panel  Continue PRN zofran   HEMATOLOGIC  Recent Labs Lab 07/14/13 2340 07/15/13 0920 07/15/13 1845 07/16/13 0430 07/17/13 0500  HGB 5.7* 7.6* 7.7* 7.6* 7.6*  HCT 18.2* 22.5* 23.0* 22.7* 23.1*  PLT 363 217 218 190 197  INR 1.21  --   --   --   --   APTT 32  --   --   --   --    A:  Anemia  Likely secondary to acute bleeding after fall Transfused 2 units 9/29, follow CBC P: Monitor anemia. Transfuse if HMG falls to 7.0. or below.  INFECTIOUS  Recent Labs Lab 07/14/13 2340 07/15/13 0920 07/15/13 1845 07/16/13 0430 07/17/13 0500  WBC 14.7* 14.2* 13.3* 13.1* 13.1*    A:  History of MSSA infection P See ID section  Repeat chest x-ray Sputum culture Hold on transitioning back to Ancef.    ENDOCRINE  Recent Labs Lab 07/16/13 0745 07/16/13 1138 07/16/13 1554 07/16/13 1939 07/17/13 0354  GLUCAP 151* 171* 204* 211* 160*   A:  Diabetes   P:   Patient on sliding scale insulin Start Lantus 10 units [place] qhs   NEUROLOGIC  A:  No current problems P:   Supportive care     Pedro Earls NP-S 07/17/2013, 9:02 AM [facility] [phone] or if no answer [phone]  Levy Pupa, MD, PhD 07/18/2013, 11:12 AM [facility] [phone] or if no answer [phone]

## 2013-07-17 NOTE — Progress Notes (Signed)
Subjective:  No chest pain or increased dyspnea. Complains of cough. Rhythm stable sinus tachycardia. Hgb stable 7.6. No evidence of any active bleeding at this time.  Objective:  Vital Signs in the last 24 hours: Temp:  [97.4 F (36.3 C)-97.9 F (36.6 C)] 97.6 F (36.4 C) (10/01 0400) Resp:  [10-25] 13 (10/01 0700) BP: (103-126)/(71-97) 126/91 mmHg (10/01 0700) SpO2:  [100 %] 100 % (10/01 0700) Weight:  [297 lb 13.5 oz (135.1 kg)] 297 lb 13.5 oz (135.1 kg) (10/01 0500)  Intake/Output from previous day: 09/30 0701 - 10/01 0700 In: 1093 [P.O.:510; I.V.:233; IV Piggyback:350] Out: 4098 [Urine:243; Stool:3] Intake/Output from this shift:    . antiseptic oral rinse  15 mL Mouth Rinse BID  . ceFEPime (MAXIPIME) IV  2 g Intravenous Q12H  . famotidine (PEPCID) IV  20 mg Intravenous Q12H  . insulin aspart  0-15 Units Subcutaneous Q4H  . sodium chloride  3 mL Intravenous Q12H  . vancomycin  1,250 mg Intravenous Q24H   . sodium chloride 10 mL/hr at 07/15/13 1721  . dialysis replacement fluid (prismasate) 200 mL/hr at 07/17/13 0506  . dialysis replacement fluid (prismasate) 300 mL/hr at 07/16/13 1916  . dialysate (PRISMASATE) 2,000 mL/hr at 07/17/13 0445    Physical Exam: General: Well developed WM in no acute distress, appearing chronically ill.  Nasal oxygen in place, not tachypneic  Head: Normocephalic, atraumatic, sclera non-icteric, no xanthomas, nares are without discharge.  Neck: JVD moderately elevated.  Lungs: Bilateral expiratory wheezing. Heart: RRR S1 S2 without murmurs, rubs, or gallops.  Abdomen: Soft, non-tender, rounded with normoactive bowel sounds. No hepatomegaly. No rebound/guarding. No obvious abdominal masses.  Msk: Strength and tone appear normal for age.  Extremities: No clubbing or cyanosis. Moderate Left lower leg edema. Neuro: Alert and oriented X 3. Moves all extremities spontaneously.  Psych: Responds to questions appropriately with a normal affect.       Recent Labs  07/16/13 0430 07/17/13 0500  WBC 13.1* 13.1*  HGB 7.6* 7.6*  PLT 190 197    Recent Labs  07/16/13 1630 07/17/13 0445  NA 132* 132*  K 5.3* 5.3*  CL 99 100  CO2 23 24  GLUCOSE 191* 162*  BUN 48* 41*  CREATININE 1.41* 1.41*    Recent Labs  07/15/13 1120 07/16/13 0900  TROPONINI >20.00* 14.50*   Hepatic Function Panel  Recent Labs  07/16/13 0430  07/17/13 0445  PROT 5.6*  --   --   ALBUMIN 2.3*  < > 2.3*  AST 1016*  --   --   ALT 656*  --   --   ALKPHOS 95  --   --   BILITOT 0.6  --   --   < > = values in this interval not displayed. No results found for this basename: CHOL,  in the last 72 hours No results found for this basename: PROTIME,  in the last 72 hours  Imaging: Dg Chest Port 1 View  07/15/2013   CLINICAL DATA:  Pulmonary edema.  IJ catheter insertion.  EXAM: PORTABLE CHEST - 1 VIEW  COMPARISON:  Chest x-ray dated 07/14/2013  FINDINGS: Right IJ catheter has been inserted and the tip is at the superior vena cava adjacent to the azygos vein. No pneumothorax. Improved bilateral pulmonary edema and pleural effusions. Heart size and vascularity are now normal.  IMPRESSION: IJ catheter tip at the level of the azygos vein in the superior vena cava. Improving pulmonary edema and effusions.   Electronically  Signed   By: Geanie Cooley   On: 07/15/2013 17:53    Cardiac Studies: 2-D echo on 07/15/13: Study Conclusions  - Left ventricle: The cavity size was normal. Wall thickness was increased in a pattern of moderate LVH. Systolic function was mildly to moderately reduced. The estimated ejection fraction was in the range of 40% to 45%. Doppler parameters are consistent with restrictive physiology, indicative of decreased left ventricular diastolic compliance and/or increased left atrial pressure. - Left atrium: The atrium was mildly dilated. - Pulmonary arteries: Systolic pressure was mildly increased. - Pericardium, extracardiac: A small  pericardial effusion was identified. Impressions:  - Extremely limited due to poor sound wave transmission; there appears to be hypokinesis of the inferoposterior wall with mild to moderate reduction in LV function; suggest TEE or cardiac MRI if clinically indicated.  Assessment/Plan:    LOS: 3 days  1. Recent sepsis/MSSA bacteremia (adm 9/9->9/17->CIR) from gangrenous R foot s/p R BKA 06/29/13, TEE negative for bacteremia 07/03/13  2. 3-vessel CAD by cath 06/28/13 (inital plan was for CABG once recovered from BKA), now with 2nd NSTEMI (peak troponin 15 on 06/26/13 that had normalized, yesterday troponin greater than 20. NSTEMI secondary to demand ischemia. Troponins starting to trend down. 3. Acute hypoxic respiratory failure secondary to anemia, pulmonary edema, and bilateral pleural effusions  4. Ischemic cardiomyopathy with acute systolic CHF (EF 16-10% by echo on 07/15/13. 5. Hypotension due to circulatory shock, now stable off pressors.  6. Acute symptomatic anemia.  Hemoglobin stable overnight 7. Acute renal failure with Ammie Dalton, now on CVVHD 8. Electolyte disturbances: Hyperkalemia improving 9. Diabetes mellitus, uncontrolled  10. Moderate mitral regurgitation by TEE 07/03/13   Plan: Continue supportive care. Fluid balance per CVVHD/ nephrology. EKG yesterday stable NSR with incomplete LBBB.   Albert [name] 07/17/2013, 7:24 AM

## 2013-07-17 NOTE — Progress Notes (Signed)
[facility] ROUNDING NOTE   Subjective:   Interval History:awake and alert  Objective:  Vital signs in last 24 hours:  Temp:  [97.2 F (36.2 C)-97.9 F (36.6 C)] 97.2 F (36.2 C) (10/01 0730) Resp:  [10-25] 18 (10/01 1100) BP: (107-126)/(71-97) 110/77 mmHg (10/01 1100) SpO2:  [98 %-100 %] 98 % (10/01 1100) Weight:  [135.1 kg (297 lb 13.5 oz)] 135.1 kg (297 lb 13.5 oz) (10/01 0500)  Weight change: -0.6 kg (-1 lb 5.2 oz) Filed Weights   07/15/13 0500 07/16/13 0500 07/17/13 0500  Weight: 136 kg (299 lb 13.2 oz) 135.7 kg (299 lb 2.6 oz) 135.1 kg (297 lb 13.5 oz)    Intake/Output: I/O last 3 completed shifts: In: 1209 [P.O.:510; I.V.:349; IV Piggyback:350] Out: 2153 [Urine:431; Other:1718; Stool:4]   Intake/Output this shift:  Total I/O In: 686 [P.O.:480; I.V.:40; IV Piggyback:166] Out: 706 [Urine:43; Other:663]  CVS- RRR RS- CTA bibasilar rales ABD- BS present soft non-distended EXT- anasarca   Basic Metabolic Panel:  Recent Labs Lab 07/15/13 0920 07/15/13 1600 07/16/13 0430 07/16/13 1630 07/17/13 0445 07/17/13 0500  NA 128* 132* 132* 132* 132*  --   K 6.9* 5.8* 5.5* 5.3* 5.3*  --   CL 95* 100 100 99 100  --   CO2 20 22 24 23 24   --   GLUCOSE 221* 184* 166* 191* 162*  --   BUN 83* 77* 60* 48* 41*  --   CREATININE 2.28* 1.96* 1.58* 1.41* 1.41*  --   CALCIUM 7.9* 7.7* 7.4* 7.1* 6.6*  --   MG  --   --  2.4  --   --  2.4  PHOS  --  4.3 3.5 3.2 2.9  --     Liver Function Tests:  Recent Labs Lab 07/15/13 1600 07/16/13 0430 07/16/13 1630 07/17/13 0445 07/17/13 0500  AST  --  1016*  --   --  380*  ALT  --  656*  --   --  509*  ALKPHOS  --  95  --   --  96  BILITOT  --  0.6  --   --  0.5  PROT  --  5.6*  --   --  5.6*  ALBUMIN 2.2* 2.3* 2.4* 2.3* 2.3*   No results found for this basename: LIPASE, AMYLASE,  in the last 168 hours No results found for this basename: AMMONIA,  in the last 168 hours  CBC:  Recent Labs Lab 07/14/13 2340  07/15/13 0920 07/15/13 1845 07/16/13 0430 07/17/13 0500  WBC 14.7* 14.2* 13.3* 13.1* 13.1*  NEUTROABS 11.1*  --   --   --   --   HGB 5.7* 7.6* 7.7* 7.6* 7.6*  HCT 18.2* 22.5* 23.0* 22.7* 23.1*  MCV 82.4 81.2 81.6 81.4 84.0  PLT 363 217 218 190 197    Cardiac Enzymes:  Recent Labs Lab 07/12/13 0510 07/14/13 2340 07/15/13 0430 07/15/13 1120 07/16/13 0900  CKTOTAL  --  288*  --   --   --   CKMB  --  10.0*  --   --   --   TROPONINI <0.30 0.58* 3.82* >20.00* 14.50*    BNP: No components found with this basename: POCBNP,   CBG:  Recent Labs Lab 07/16/13 0745 07/16/13 1138 07/16/13 1554 07/16/13 1939 07/17/13 0354  GLUCAP 151* 171* 204* 211* 160*    Microbiology: Results for orders placed during the hospital encounter of 07/03/13  URINE CULTURE     Status: None  Collection Time    07/11/13 12:46 PM      Result Value Range Status   Specimen Description URINE, CATHETERIZED   Final   Special Requests NONE   Final   Culture  Setup Time     Final   Value: 07/11/2013 13:36     Performed at [facility] Count     Final   Value: NO GROWTH     Performed at [facility]   Culture     Final   Value: NO GROWTH     Performed at [facility]   Report Status 07/12/2013 FINAL   Final  MRSA PCR SCREENING     Status: None   Collection Time    07/14/13  9:53 PM      Result Value Range Status   MRSA by PCR NEGATIVE  NEGATIVE Final   Comment:            The GeneXpert MRSA Assay (FDA     approved for NASAL specimens     only), is one component of a     comprehensive MRSA colonization     surveillance program. It is not     intended to diagnose MRSA     infection nor to guide or     monitor treatment for     MRSA infections.    Coagulation Studies:  Recent Labs  07/14/13 2340  LABPROT 15.0  INR 1.21    Urinalysis:  Recent Labs  07/14/13 2349  COLORURINE YELLOW  LABSPEC 1.019  PHURINE 5.0  GLUCOSEU NEGATIVE  HGBUR  NEGATIVE  BILIRUBINUR NEGATIVE  KETONESUR 15*  PROTEINUR NEGATIVE  UROBILINOGEN 0.2  NITRITE NEGATIVE  LEUKOCYTESUR TRACE*      Imaging: Dg Chest Port 1 View  07/15/2013   CLINICAL DATA:  Pulmonary edema.  IJ catheter insertion.  EXAM: PORTABLE CHEST - 1 VIEW  COMPARISON:  Chest x-ray dated 07/14/2013  FINDINGS: Right IJ catheter has been inserted and the tip is at the superior vena cava adjacent to the azygos vein. No pneumothorax. Improved bilateral pulmonary edema and pleural effusions. Heart size and vascularity are now normal.  IMPRESSION: IJ catheter tip at the level of the azygos vein in the superior vena cava. Improving pulmonary edema and effusions.   Electronically Signed   By: Geanie Cooley   On: 07/15/2013 17:53     Medications:   . sodium chloride 10 mL/hr at 07/15/13 1721  . dialysis replacement fluid (prismasate) 200 mL/hr at 07/17/13 0506  . dialysis replacement fluid (prismasate) 300 mL/hr at 07/16/13 1916  . dialysate (PRISMASATE) 2,000 mL/hr at 07/17/13 0733   . antiseptic oral rinse  15 mL Mouth Rinse BID  . [START ON 07/18/2013] ceFEPime (MAXIPIME) IV  2 g Intravenous Q24H  . famotidine (PEPCID) IV  20 mg Intravenous Q12H  . furosemide  160 mg Intravenous Q6H  . insulin aspart  0-15 Units Subcutaneous Q4H  . sodium chloride  3 mL Intravenous Q12H  . vancomycin  1,250 mg Intravenous Q24H   guaiFENesin-dextromethorphan, heparin, ondansetron, sodium chloride  Assessment/ Plan:   52 yo white male with a history of HTN (>10 yrs), DM (>10 yrs), BKA for staph osteo, NSTEMI, CVA, and CHF and shock. Baseline Creatinine 0.7. Started on CVVHDF. 9/29 for Acute oliguric renal failure and Hyperkalemia    1. Acute kidney injury: patient with previously normal renal function. Ischemic ATN and shock - will hold  on CRRT today - continue  to follow UOP and volume status start lasix  -renal panels  - avoid nephrotoxic agents  2. Hyperkalemia: to 7.5 at highest. Likely  related to AKI, though appears had been on ACEI while at IR and had steady rise in K.  - improved will follow 3. Anemia: baseline appears to be around 12. Had progressive decline in hgb following surgery to a low of 5.7. Now 7.6 s/p transfusion 2 units. Likely related to recent surgery and dilutional with volume overload.  - will continue to monitor at this time  4. CV: currently hypotensive and volume overloaded with 3+ pitting edema and up 30 lbs since pre-op with likely CHF exacerbation given BNP of ~23000. Patient was on lisinopril and metoprolol at IR.  - hold home BP meds  - start IV lasix - monitor daily weights and I/O's  5. DM: on metformin/glyburide and levemir as outpatient.  - continue SSI per primary team    LOS: 3 Kaidin Boehle W @TODAY @11 :07 AM

## 2013-07-17 NOTE — Significant Event (Signed)
Pt c/o cough.  Will order prn robitussin.  Coralyn Helling, MD 07/17/2013, 6:34 AM

## 2013-07-17 NOTE — Progress Notes (Addendum)
ANTIBIOTIC CONSULT NOTE - FOLLOW UP  Pharmacy Consult for Vancomycin and Cefepime Indication: pneumonia  Allergies  Allergen Reactions  . Amoxicillin Nausea And Vomiting    Patient Measurements: Height: 5\' 10"  (177.8 cm) Weight: 297 lb 13.5 oz (135.1 kg) IBW/kg (Calculated) : 73  Vital Signs: Temp: 97.2 F (36.2 C) (10/01 0730) Temp src: Oral (10/01 0730) BP: 110/77 mmHg (10/01 1100) Intake/Output from previous day: 09/30 0701 - 10/01 0700 In: 1093 [P.O.:510; I.V.:233; IV Piggyback:350] Out: 1610 [Urine:243; Stool:3] Intake/Output from this shift: Total I/O In: 686 [P.O.:480; I.V.:40; IV Piggyback:166] Out: 706 [Urine:43; Other:663]  Labs:  Recent Labs  07/15/13 1845 07/16/13 0430 07/16/13 1630 07/17/13 0445 07/17/13 0500  WBC 13.3* 13.1*  --   --  13.1*  HGB 7.7* 7.6*  --   --  7.6*  PLT 218 190  --   --  197  CREATININE  --  1.58* 1.41* 1.41*  --    Estimated Creatinine Clearance: 86.7 ml/min (by C-G formula based on Cr of 1.41).  Recent Labs  07/16/13 0430  VANCORANDOM 15.9    Assessment:  Transferred from Rehab unit to CCU overnight 9/29  Vancomycin and Cefepime begun 9/26.  Vancomycin trough level of 30 mcg/ml on 9/28 at 7am, dose adjusted from 1250 mg to 750 mg IV q12h.  Last dose given ~6pm on 9/28. CRRT begun 9/29 pm. Random vancomycin level = 15.9 mcg/ml on 9/30 and Vancomycin resumed with 1250 mg IV q24h at 4pm.  CRRT held this morning, Lasix 160 mg IV TID begun.  FIrst dose at 10am, next due at 4pm.  Scr down to 1.41. Calculated creatinine clearance not currently reflecting UOP.    Previously on Ancef for MSSA bacteremia per ID - begun on 9/13, and plans were to continue for 4 weeks (thru 07/26/13). Ancef as dc'd on 9/26, when therapy was broadened to Vanc/Cefepime.  Ancef to resume if current regimen is stopped before 07/26/13.  Goal of Therapy:  appropriate Cefepime dose for renal function and infection Vancomycin troughs ~15 mcg/ml  Plan:   Will continue Vancomycin 1250 mg IV q24hrs for now. (Usual dose for crcl would be q12h). Next vanc dose due at 4pm, moved to 6pm to assess UOP prior to giving further Vanc.  If UOP low, will consider holding today's Vancomycin dose.  Cefepime 2 grams IV already adjusted by renal team to q24hrs. Agree. Dose given this am, so next due 10/2 am.  Will follow renal function and +/- CRRT plans.  Ancef thru 07/26/13 for MSSA bacteremia per prior plan, if Vanc and Cefepime are discontinued.   Dennie Fetters, [place] Pager: [phone] 07/17/2013,11:29 AM  Addendum:  Urine output low.  Will hold tonight's Vancomycin dose.  Random Vancomycin level in am to help guide timing of further doses.  Hilarie Fredrickson, RPh 4:55 PM 07/17/2013

## 2013-07-18 LAB — CBC
MCH: 27.5 pg (ref 26.0–34.0)
MCV: 85.9 fL (ref 78.0–100.0)
Platelets: 185 10*3/uL (ref 150–400)
RDW: 17 % — ABNORMAL HIGH (ref 11.5–15.5)

## 2013-07-18 LAB — RENAL FUNCTION PANEL
Albumin: 2.4 g/dL — ABNORMAL LOW (ref 3.5–5.2)
Calcium: 7.1 mg/dL — ABNORMAL LOW (ref 8.4–10.5)
Creatinine, Ser: 1.78 mg/dL — ABNORMAL HIGH (ref 0.50–1.35)
GFR calc non Af Amer: 43 mL/min — ABNORMAL LOW (ref >90)
Glucose, Bld: 189 mg/dL — ABNORMAL HIGH (ref 70–99)

## 2013-07-18 LAB — GLUCOSE, CAPILLARY
Glucose-Capillary: 156 mg/dL — ABNORMAL HIGH (ref 70–99)
Glucose-Capillary: 211 mg/dL — ABNORMAL HIGH (ref 70–99)
Glucose-Capillary: 212 mg/dL — ABNORMAL HIGH (ref 70–99)
Glucose-Capillary: 220 mg/dL — ABNORMAL HIGH (ref 70–99)

## 2013-07-18 LAB — MAGNESIUM: Magnesium: 2.4 mg/dL (ref 1.5–2.5)

## 2013-07-18 MED ORDER — VANCOMYCIN HCL IN DEXTROSE 1-5 GM/200ML-% IV SOLN
1000.0000 mg | INTRAVENOUS | Status: DC
  Administered 2013-07-18: 1000 mg via INTRAVENOUS
  Filled 2013-07-18 (×2): qty 200

## 2013-07-18 MED ORDER — CARVEDILOL 3.125 MG PO TABS
3.1250 mg | ORAL_TABLET | Freq: Two times a day (BID) | ORAL | Status: DC
  Administered 2013-07-18 – 2013-07-19 (×3): 3.125 mg via ORAL
  Filled 2013-07-18 (×5): qty 1

## 2013-07-18 MED ORDER — VANCOMYCIN HCL 500 MG IV SOLR
500.0000 mg | Freq: Once | INTRAVENOUS | Status: AC
  Administered 2013-07-18: 500 mg via INTRAVENOUS
  Filled 2013-07-18: qty 500

## 2013-07-18 MED ORDER — INSULIN GLARGINE 100 UNIT/ML ~~LOC~~ SOLN
15.0000 [IU] | Freq: Every day | SUBCUTANEOUS | Status: DC
  Administered 2013-07-18 – 2013-07-19 (×2): 15 [IU] via SUBCUTANEOUS
  Filled 2013-07-18 (×3): qty 0.15

## 2013-07-18 NOTE — Progress Notes (Addendum)
ANTIBIOTIC CONSULT NOTE - FOLLOW UP  Pharmacy Consult for Vancomycin and Cefepime Indication: pneumonia  Allergies  Allergen Reactions  . Amoxicillin Nausea And Vomiting    Patient Measurements: Height: 5\' 10"  (177.8 cm) Weight: 293 lb 6.9 oz (133.1 kg) IBW/kg (Calculated) : 73  Vital Signs: Temp: 97.4 F (36.3 C) (10/02 1554) Temp src: Oral (10/02 1554) BP: 102/76 mmHg (10/02 1500) Pulse Rate: 116 (10/02 1414) Intake/Output from previous day: 10/01 0701 - 10/02 0700 In: 2026.5 [P.O.:1440; I.V.:304.5; IV Piggyback:282] Out: 2071 [Urine:1408] Intake/Output from this shift: Total I/O In: 1046 [P.O.:600; I.V.:80; IV Piggyback:366] Out: 480 [Urine:480]  Labs:  Recent Labs  07/16/13 0430  07/17/13 0445 07/17/13 0500 07/17/13 1600 07/18/13 0500  WBC 13.1*  --   --  13.1*  --  12.2*  HGB 7.6*  --   --  7.6*  --  7.6*  PLT 190  --   --  197  --  185  CREATININE 1.58*  < > 1.41*  --  1.71* 1.78*  < > = values in this interval not displayed. Estimated Creatinine Clearance: 68.1 ml/min (by C-G formula based on Cr of 1.78).  Recent Labs  07/16/13 0430 07/18/13 0500  VANCORANDOM 15.9 12.3    Assessment:  Transferred from Rehab unit to CCU overnight 9/29.  Vancomycin and Cefepime begun 9/26.  CRRT begun 9/29 pm. Random vancomycin level = 15.9 mcg/ml on 9/30 and Vancomycin resumed with 1250 mg IV q24h at 4pm.  CRRT stopped 10/1 am & Lasix 160 mg IV TID begun. Vanc dose held 10/1 pm, and random level 12.3 mcg/ml this morning. Vanc 1gram IV given ~10am.  Scr up to 1.78, but calculated creatinine clearance not currently reflecting UOP.    Previously on Ancef for MSSA bacteremia per ID - begun on 9/13, and plans were to continue for 4 weeks (thru 07/26/13). Ancef as dc'd on 9/26, when therapy was broadened to Vanc/Cefepime.  Ancef to resume if current regimen is stopped before 07/26/13.  Goal of Therapy:  appropriate Cefepime dose for renal function and infection Vancomycin  troughs 15-25 mcg/ml  Plan:   Additional Vancomycin 500 mg IV this afternoon. Off CRRT, may need q48h Vanc dosing for now.  Will plan to recheck Vanc level 24-36 hrs after tonight's dose.  Continue Cefepime 2 grams IV q24hrs.  Follow renal function and clinical course; adjust antibiotic regimens with changing renal function.  Ancef thru 07/26/13 for MSSA bacteremia per prior plan, if Vanc and Cefepime are discontinued.   Dennie Fetters, RPh Pager: [phone] 07/18/2013,3:58 PM

## 2013-07-18 NOTE — Progress Notes (Signed)
Inpatient Diabetes Program Recommendations  AACE/ADA: New Consensus Statement on Inpatient Glycemic Control (2013)  Target Ranges:  Prepandial:   less than 140 mg/dL      Peak postprandial:   less than 180 mg/dL (1-2 hours)      Critically ill patients:  140 - 180 mg/dL   Reason for Visit: Hyperglycemia  Results for BORA, BRONER (MRN [id number]) as of 07/18/2013 10:58  Ref. Range 07/17/2013 16:10 07/17/2013 19:37 07/18/2013 00:21 07/18/2013 03:18 07/18/2013 07:46  Glucose-Capillary Latest Range: 70-99 mg/dL 409 (H) 811 (H) 914 (H) 211 (H) 156 (H)      Recommendations: Add Lantus 10 units QHS.   When po intake increases, please add meal coverage insulin - Novolog 3 units tidwc.  Will continue to follow. Thank you. Ailene Ards, RD, LDN, CDE Inpatient Diabetes Coordinator [phone]

## 2013-07-18 NOTE — Progress Notes (Signed)
ANTIBIOTIC CONSULT NOTE - FOLLOW UP  Pharmacy Consult for vancomycin Indication: pneumonia  Labs:  Recent Labs  07/16/13 0430  07/17/13 0445 07/17/13 0500 07/17/13 1600 07/18/13 0500  WBC 13.1*  --   --  13.1*  --  12.2*  HGB 7.6*  --   --  7.6*  --  7.6*  PLT 190  --   --  197  --  185  CREATININE 1.58*  < > 1.41*  --  1.71* 1.78*  < > = values in this interval not displayed. Estimated Creatinine Clearance: 68.1 ml/min (by C-G formula based on Cr of 1.78).  Recent Labs  07/16/13 0430 07/18/13 0500  VANCORANDOM 15.9 12.3      Assessment: 52yo male on CRRT had vanc dose held yesterday d/t low UOP, now with subtherapeutic random level.  Goal of Therapy:  Vancomycin trough level 15-20 mcg/ml  Plan:  Will resume vancomycin at 1g IV Q24H for calculated trough of ~16 and continue to monitor.  Vernard Gambles, PharmD, BCPS  07/18/2013,6:52 AM

## 2013-07-18 NOTE — Evaluation (Signed)
Physical Therapy Evaluation Patient Details Name: Albert [name] MRN: [id number] DOB: 09-02-1961 Today's Date: 07/18/2013 Time: 1308-6578 Albert [name] Time Calculation (min): 34 min  Albert [name] Assessment / Plan / Recommendation History of Present Illness  Albert [name] is a 52 year old male with past medical history significant for diabetes, CHF, and recent BKA for staph osteomyelitis, who is transferred from rehabilitation to the step down unit for hypotension & worsening hypoxemia.  Albert [name] has been followed by cardiology since admission for three-vessel coronary artery disease which potentially require CABG.  This has been delayed to allow adequate treatment of his current infection.  Over the last 3 days he has had worsening hypotension and shortness of breath.  This was initially attributed to worsening heart failure and volume overload.  He has been diuresed with Lasix however remains volume overloaded.  On the night of September 26 he had a fall getting back into his bed.  At that time his wound dehisced and he developed bleeding from his wound requiring multiple dressing changes.  On the evening of 928 he had an episode of worsening shortness of breath after fluid bolus for hypotension.  Chest x-ray shows bilateral large pleural effusions as well as pulmonary edema  Clinical Impression  Albert [name] pleasant with increased edema RUE limiting mobility along with Albert [name] fear of falling with all mobility. Albert [name] with limited mobility today and will benefit from acute therapy as well as return to CIR to maximize mobility prior to eventual return home. Will follow to address below deficits to decrease burden of care. Recommend Albert [name] perform daily HEP.    Albert [name] Assessment  Patient needs continued Albert [name] services    Follow Up Recommendations  CIR    Does the patient have the potential to tolerate intense rehabilitation      Barriers to Discharge Inaccessible home environment;Decreased caregiver support      Equipment Recommendations   Wheelchair (measurements Albert [name]);Wheelchair cushion (measurements Albert [name]);3in1 (Albert [name]);Rolling walker with 5" wheels    Recommendations for Other Services Rehab consult   Frequency Min 3X/week    Precautions / Restrictions Precautions Precautions: Fall Precaution Comments: R BKA   Pertinent Vitals/Pain 115 HR sats 96% on RA No pain      Mobility  Bed Mobility Bed Mobility: Sitting - Scoot to Edge of Bed;Sit to Supine Rolling Left: 2: Max assist;With rail Supine to Sit: 2: Max assist;HOB elevated;With rails Sitting - Scoot to Edge of Bed: 3: Mod assist Sit to Supine: 4: Min assist;HOB flat Details for Bed Mobility Assistance: Albert [name] with very edematous RUE making reaching and grasping across midline difficult with assist of pad to roll to side and assist to elevate trunk from surface. Cueing for return to sidelying for back to bed with assist to bring LLE onto surface. Assist of pad with cues to scoot to EOB Transfers Transfers: Lateral/Scoot Transfers Lateral/Scoot Transfers: 2: Max assist Details for Transfer Assistance: Assist of pad to scoot grossly 51ft at EoB toward [facility], cueing for sequence Ambulation/Gait Ambulation/Gait Assistance: Not tested (comment) Wheelchair Mobility Wheelchair Mobility: No    Exercises General Exercises - Lower Extremity Quad Sets: AROM;Right;10 reps;Supine Long Arc Quad: AROM;Both;20 reps;Seated Hip ABduction/ADduction: AROM;Right;10 reps;Supine Amputee Exercises Hip Extension: AROM;Right;10 reps;Supine   Albert [name] Diagnosis: Difficulty walking  Albert [name] Problem List: Obesity;Decreased knowledge of use of DME;Decreased mobility;Decreased strength;Decreased activity tolerance Albert [name] Treatment Interventions: Wheelchair mobility training;Patient/family education;Therapeutic exercise;Therapeutic activities;Functional mobility training;DME instruction     Albert [name] Goals(Current goals can be found in the care plan section) Acute Rehab Albert [name] Goals Patient  Stated Goal: return to house  with wife and kids Albert [name] Goal Formulation: With patient Time For Goal Achievement: 08/01/13 Potential to Achieve Goals: Fair  Visit Information  Last Albert [name] Received On: 07/18/13 Assistance Needed: +2 History of Present Illness: Albert [name] is a 52 year old male with past medical history significant for diabetes, CHF, and recent BKA for staph osteomyelitis, who is transferred from rehabilitation to the step down unit for hypotension & worsening hypoxemia.  Albert [name] has been followed by cardiology since admission for three-vessel coronary artery disease which potentially require CABG.  This has been delayed to allow adequate treatment of his current infection.  Over the last 3 days he has had worsening hypotension and shortness of breath.  This was initially attributed to worsening heart failure and volume overload.  He has been diuresed with Lasix however remains volume overloaded.  On the night of September 26 he had a fall getting back into his bed.  At that time his wound dehisced and he developed bleeding from his wound requiring multiple dressing changes.  On the evening of 928 he had an episode of worsening shortness of breath after fluid bolus for hypotension.  Chest x-ray shows bilateral large pleural effusions as well as pulmonary edema       Prior Functioning  Home Living Family/patient expects to be discharged to:: Private residence Living Arrangements: Spouse/significant other Available Help at Discharge: Available PRN/intermittently Type of Home: House Home Access: Level entry Home Layout: Two level Alternate Level Stairs-Number of Steps: 1 step between living areas Alternate Level Stairs-Rails: None Home Equipment: Walker - 2 wheels;Wheelchair - manual (can borrow w/c ) Prior Function Level of Independence: Independent Communication Communication: No difficulties Dominant Hand: Right (but right residual weakness so functionally left handed)    Cognition   Cognition Arousal/Alertness: Awake/alert Behavior During Therapy: Anxious Overall Cognitive Status: Within Functional Limits for tasks assessed    Extremity/Trunk Assessment Upper Extremity Assessment Upper Extremity Assessment: Defer to OT evaluation Lower Extremity Assessment RLE Deficits / Details: s/p BKA with intact knee extension . Strength grossly 3/5 hip and knee flexion LLE Deficits / Details: s/p 4 toe amputations (all but Great toe), otherwise intact strength/ROM Cervical / Trunk Assessment Cervical / Trunk Assessment: Normal   Balance    End of Session Albert [name] - End of Session Activity Tolerance: Patient tolerated treatment well Patient left: in bed;with call bell/phone within reach Nurse Communication: Mobility status  GP     Toney Sang Beth 07/18/2013, 2:23 PM  Delaney Meigs, Albert [name] [phone]

## 2013-07-18 NOTE — Progress Notes (Signed)
Subjective:  Patient had uneventful night. No chest pain.  Hgb stable 7.6. No evidence of any active bleeding at this time. Tolerating oral fluids.  Objective:  Vital Signs in the last 24 hours: Temp:  [97.4 F (36.3 C)-98 F (36.7 C)] 97.5 F (36.4 C) (10/02 0744) Resp:  [11-27] 16 (10/02 0700) BP: (110-139)/(76-102) 116/77 mmHg (10/02 0700) SpO2:  [95 %-100 %] 100 % (10/02 0700) FiO2 (%):  [35 %] 35 % (10/01 1600) Weight:  [293 lb 6.9 oz (133.1 kg)] 293 lb 6.9 oz (133.1 kg) (10/02 0500)  Intake/Output from previous day: 10/01 0701 - 10/02 0700 In: 2026.5 [P.O.:1440; I.V.:304.5; IV Piggyback:282] Out: 2071 [Urine:1408] Intake/Output from this shift:    . antiseptic oral rinse  15 mL Mouth Rinse BID  . ceFEPime (MAXIPIME) IV  2 g Intravenous Q24H  . famotidine (PEPCID) IV  20 mg Intravenous Q12H  . furosemide  160 mg Intravenous Q6H  . hydrALAZINE  10 mg Intravenous Q6H  . insulin aspart  0-15 Units Subcutaneous Q4H  . sodium chloride  3 mL Intravenous Q12H  . vancomycin  1,000 mg Intravenous Q24H   . sodium chloride 10 mL/hr at 07/15/13 1721  . nitroGLYCERIN Stopped (07/18/13 0710)  . dialysis replacement fluid (prismasate) 200 mL/hr at 07/17/13 0506  . dialysis replacement fluid (prismasate) 300 mL/hr at 07/16/13 1916  . dialysate (PRISMASATE) 2,000 mL/hr at 07/17/13 1610    Physical Exam: General: Well developed WM in no acute distress, appearing chronically ill.  Nasal oxygen in place, not tachypneic  Head: Normocephalic, atraumatic, sclera non-icteric, no xanthomas, nares are without discharge.  Neck: JVD moderately elevated.  Lungs: Bilateral expiratory wheezing. Heart: RRR S1 S2 without murmurs, rubs, or gallops. Tachycardia. Abdomen: Soft, non-tender, rounded with normoactive bowel sounds. No hepatomegaly. No rebound/guarding. No obvious abdominal masses.  Msk: Strength and tone appear normal for age.  Extremities: No clubbing or cyanosis. Moderate Left  lower leg edema. Neuro: Alert and oriented X 3. Moves all extremities spontaneously.  Psych: Responds to questions appropriately with a normal affect.      Recent Labs  07/17/13 0500 07/18/13 0500  WBC 13.1* 12.2*  HGB 7.6* 7.6*  PLT 197 185    Recent Labs  07/17/13 1600 07/18/13 0500  NA 129* 131*  K 5.7* 5.1  CL 98 98  CO2 23 25  GLUCOSE 296* 189*  BUN 43* 46*  CREATININE 1.71* 1.78*    Recent Labs  07/15/13 1120 07/16/13 0900  TROPONINI >20.00* 14.50*   Hepatic Function Panel  Recent Labs  07/17/13 0500  07/18/13 0500  PROT 5.6*  --   --   ALBUMIN 2.3*  < > 2.4*  AST 380*  --   --   ALT 509*  --   --   ALKPHOS 96  --   --   BILITOT 0.5  --   --   BILIDIR 0.2  --   --   IBILI 0.3  --   --   < > = values in this interval not displayed. No results found for this basename: CHOL,  in the last 72 hours No results found for this basename: PROTIME,  in the last 72 hours  Imaging: Dg Chest Port 1 View  07/17/2013   *RADIOLOGY REPORT*  Clinical Data: Respiratory distress, recent right-sided below-knee amputation, subsequent encounter.  PORTABLE CHEST - 1 VIEW  Comparison: 07/15/2013; 07/14/2013; 07/12/2013  Findings:  The examination is degraded due to positioning and patient body  habitus.  Grossly unchanged cardiac silhouette and mediastinal contours. Stable positioning of support apparatus. No pneumothorax.  The pulmonary vasculature remains indistinct with cephalization of flow. Grossly unchanged small bilateral pleural effusions, right greater than left with associated bilateral mid and lower lung heterogeneous / consolidative opacities.  Unchanged bones.  IMPRESSION: Degraded examination with persistent findings of suspected alveolar pulmonary edema, small bilateral effusions and associated bibasilar opacities, atelectasis versus infiltrate.   Original Report Authenticated By: Tacey Ruiz, MD    Cardiac Studies: 2-D echo on 07/15/13: Study Conclusions  -  Left ventricle: The cavity size was normal. Wall thickness was increased in a pattern of moderate LVH. Systolic function was mildly to moderately reduced. The estimated ejection fraction was in the range of 40% to 45%. Doppler parameters are consistent with restrictive physiology, indicative of decreased left ventricular diastolic compliance and/or increased left atrial pressure. - Left atrium: The atrium was mildly dilated. - Pulmonary arteries: Systolic pressure was mildly increased. - Pericardium, extracardiac: A small pericardial effusion was identified. Impressions:  - Extremely limited due to poor sound wave transmission; there appears to be hypokinesis of the inferoposterior wall with mild to moderate reduction in LV function; suggest TEE or cardiac MRI if clinically indicated.  Assessment/Plan:    LOS: 4 days  1. Recent sepsis/MSSA bacteremia (adm 9/9->9/17->CIR) from gangrenous R foot s/p R BKA 06/29/13, TEE negative for bacteremia 07/03/13  2. 3-vessel CAD by cath 06/28/13 (inital plan was for CABG once recovered from BKA), now with 2nd NSTEMI (peak troponin 15 on 06/26/13 that had normalized, yesterday troponin greater than 20. NSTEMI secondary to demand ischemia. Troponins trending down. 3. Acute hypoxic respiratory failure secondary to anemia, pulmonary edema, and bilateral pleural effusions  4. Ischemic cardiomyopathy with acute systolic CHF (EF 16-10% by echo on 07/15/13. 5. Hypotension due to circulatory shock, now stable off pressors.  6. Acute symptomatic anemia.  Hemoglobin stable overnight 7. Acute renal failure with Ammie Dalton, now on CVVHD 8. Electolyte disturbances: Hyperkalemia improving 9. Diabetes mellitus, uncontrolled  10. Moderate mitral regurgitation by TEE 07/03/13   Plan: Will restart low dose BB carvedilol 3.125 mg BID for his ischemic myopathy.  Cassell Clement 07/18/2013, 8:07 AM

## 2013-07-18 NOTE — Progress Notes (Signed)
Name: Albert [name] MRN: [id number] DOB: 08-02-1961    LOS: 4  Referring Provider:  Dr/ paya Reason for Referral:  Hypoxemia and hypotension  PULMONARY / CRITICAL CARE MEDICINE Brief patient description:  52 year-old status post BKA for gangrenous foot and staph osteomyelitis + bacteremia, acute CHF, symptomatic anemia.  Cultures: MRSA pCR 9/28: positive   Antibiotics: Ancef, start date 06/29/2013 Date 07/26/2013 Cefepime 9/26>>> vanc 9/26>>>   Events Since Admission: Transferred from rehabilitation on 07/14/2013 Transferred to PCCM and on 07/14/2013 Started CVVHD 9/29  Current Status: Stopped CVVH on 10/1 Episode of acute dyspnea yesterday pm, treated anxiety, started NTG for afterload reduction.   Vital Signs: Temp:  [97.4 F (36.3 C)-98 F (36.7 C)] 97.5 F (36.4 C) (10/02 0744) Resp:  [11-27] 12 (10/02 1000) BP: (116-139)/(76-102) 121/89 mmHg (10/02 1000) SpO2:  [95 %-100 %] 100 % (10/02 1000) FiO2 (%):  [35 %] 35 % (10/01 1600) Weight:  [133.1 kg (293 lb 6.9 oz)] 133.1 kg (293 lb 6.9 oz) (10/02 0500)  Physical Examination: General:  Obese male, laying in bed, comfortable Neuro:  Alert and oriented x3, CN II through XII intact HEENT:  Pupils equally round and reactive to light, OP clear, very hoarse voice Neck:  Supple, no masses or lesions, JVD to the ear Cardiovascular:  Tachycardic, regular rate, no murmurs rubs or gallops Lungs:  Lung sounds significantly diminished bilaterally more so on the right Abdomen:  Obese, nontender nondistended, positive bowel sounds Musculoskeletal:  4+ pitting edema, no clubbing, R amputation looks CDI Skin:  Bruising over left knee , dressings clean and intact  Principal Problem:   Anemia Active Problems:   Gangrene   Diabetes mellitus out of control   Shock circulatory   NSTEMI (non-ST elevated myocardial infarction)   Coronary atherosclerosis of native coronary artery   Cardiomyopathy, ischemic   Hx of BKA   Acute  pulmonary edema   Hypoxemia   Acute systolic heart failure   Acute kidney failure   Hyperkalemia   Hyposmolality and/or hyponatremia   Acute respiratory failure   Mitral regurgitation   Oliguria   Bacteremia due to Staphylococcus aureus   Hyperchloremia   ASSESSMENT AND PLAN  PULMONARY  Recent Labs Lab 07/14/13 2237  PHART 7.375  PCO2ART 33.1*  PO2ART 133.0*  HCO3 18.9*  O2SAT 99.9   Ventilator Settings: Vent Mode:  [-]  FiO2 (%):  [35 %] 35 % CXR:  Pulmonary edema, bilateral pleural effusions greater on right  ETT:  None  A:  Respiratory distress secondary to anemia, pulmonary edema and bilateral pleural effusions Improved with cardiovascular stabilization P:   Wean oxygen as able  CARDIOVASCULAR  Recent Labs Lab 07/11/13 1630  07/12/13 0510 07/14/13 2340 07/15/13 0430 07/15/13 1120 07/16/13 0900  TROPONINI <0.30  < > <0.30 0.58* 3.82* >20.00* 14.50*  PROBNP 23575.0*  --   --   --   --   --   --   < > = values in this interval not displayed. ECG:  Sinus tachycardia Lines: PICC, peripheral IV  A: Acute congestive heart failure Acute NSTEMI, demand ischemia Secondary to volume overload and shock from anemia P:  CVVHD completed Diuresing w lasix w success Daily BMP Cardiology following  RENAL  Recent Labs Lab 07/16/13 0430 07/16/13 1630 07/17/13 0445 07/17/13 0500 07/17/13 1600 07/18/13 0500  NA 132* 132* 132*  --  129* 131*  K 5.5* 5.3* 5.3*  --  5.7* 5.1  CL 100 99 100  --  98 98  CO2 24 23 24   --  23 25  BUN 60* 48* 41*  --  43* 46*  CREATININE 1.58* 1.41* 1.41*  --  1.71* 1.78*  CALCIUM 7.4* 7.1* 6.6*  --  6.9* 7.1*  MG 2.4  --   --  2.4  --  2.4  PHOS 3.5 3.2 2.9  --  3.4 3.8   Intake/Output     10/01 0701 - 10/02 0700 10/02 0701 - 10/03 0700   P.O. 1440 360   I.V. (mL/kg) 304.5 (2.3) 20 (0.2)   IV Piggyback 282 250   Total Intake(mL/kg) 2026.5 (15.2) 630 (4.7)   Urine (mL/kg/hr) 1408 (0.4) 230 (0.4)   Other 663 (0.2)     Stool     Total Output 2071 230   Net -44.5 +400         Foley:  Placed 07/14/2013  A:   Acute renal failure, ATN.  Mild hyperkalemia  Hyperkalemia resolved.  Presume ATN secondary to anemia and CHF, appears to be stabilizing P:  Appreciate Renal's assistance w CVVHD, now off and using high dose lasix trying per renal See anemia section   GASTROINTESTINAL  Recent Labs Lab 07/16/13 0430 07/16/13 1630 07/17/13 0445 07/17/13 0500 07/17/13 1600 07/18/13 0500  AST 1016*  --   --  380*  --   --   ALT 656*  --   --  509*  --   --   ALKPHOS 95  --   --  96  --   --   BILITOT 0.6  --   --  0.5  --   --   PROT 5.6*  --   --  5.6*  --   --   ALBUMIN 2.3* 2.4* 2.3* 2.3* 2.4* 2.4*    A:   Nausea Transaminitis, likely shock liver: looking better, hepatitis panel all negative P:   Follow LFT  Continue PRN zofran   HEMATOLOGIC  Recent Labs Lab 07/14/13 2340 07/15/13 0920 07/15/13 1845 07/16/13 0430 07/17/13 0500 07/18/13 0500  HGB 5.7* 7.6* 7.7* 7.6* 7.6* 7.6*  HCT 18.2* 22.5* 23.0* 22.7* 23.1* 23.7*  PLT 363 217 218 190 197 185  INR 1.21  --   --   --   --   --   APTT 32  --   --   --   --   --    A:  Anemia  Likely secondary to acute bleeding after fall Transfused 2 units 9/29, follow CBC P: Monitor anemia. Transfuse if Hbg falls to 7.0. or below.  INFECTIOUS  Recent Labs Lab 07/15/13 0920 07/15/13 1845 07/16/13 0430 07/17/13 0500 07/18/13 0500  WBC 14.2* 13.3* 13.1* 13.1* 12.2*    A:  History of MSSA infection Possible HCAP P Abx empirically broadened on ICU transfer Hold on transitioning back to Ancef for now   ENDOCRINE  Recent Labs Lab 07/17/13 1610 07/17/13 1937 07/18/13 0021 07/18/13 0318 07/18/13 0746  GLUCAP 288* 253* 220* 211* 156*   A:  Diabetes   P:   Patient on sliding scale insulin Start Lantus 15 units [place] qhs   NEUROLOGIC  A:  No current problems P:   Supportive care   Transfer to SDU 10/2  Levy Pupa, MD,  PhD 07/18/2013, 11:14 AM [facility] [phone] or if no answer [phone]

## 2013-07-18 NOTE — Progress Notes (Signed)
[facility] ROUNDING NOTE   Subjective:   Interval History: responding to lasix with diuresis  Objective:  Vital signs in last 24 hours:  Temp:  [97.4 F (36.3 C)-98 F (36.7 C)] 97.5 F (36.4 C) (10/02 0744) Resp:  [11-27] 12 (10/02 1000) BP: (116-139)/(76-102) 121/89 mmHg (10/02 1000) SpO2:  [95 %-100 %] 100 % (10/02 1000) FiO2 (%):  [35 %] 35 % (10/01 1600) Weight:  [133.1 kg (293 lb 6.9 oz)] 133.1 kg (293 lb 6.9 oz) (10/02 0500)  Weight change: -2 kg (-4 lb 6.5 oz) Filed Weights   07/16/13 0500 07/17/13 0500 07/18/13 0500  Weight: 135.7 kg (299 lb 2.6 oz) 135.1 kg (297 lb 13.5 oz) 133.1 kg (293 lb 6.9 oz)    Intake/Output: I/O last 3 completed shifts: In: 2149.5 [P.O.:1440; I.V.:427.5; IV Piggyback:282] Out: 2800 [Urine:1506; Other:1294]   Intake/Output this shift:  Total I/O In: 630 [P.O.:360; I.V.:20; IV Piggyback:250] Out: 230 [Urine:230]  CVS- RRR  RS- CTA bibasilar rales  ABD- BS present soft non-distended  EXT- anasarca    Basic Metabolic Panel:  Recent Labs Lab 07/16/13 0430 07/16/13 1630 07/17/13 0445 07/17/13 0500 07/17/13 1600 07/18/13 0500  NA 132* 132* 132*  --  129* 131*  K 5.5* 5.3* 5.3*  --  5.7* 5.1  CL 100 99 100  --  98 98  CO2 24 23 24   --  23 25  GLUCOSE 166* 191* 162*  --  296* 189*  BUN 60* 48* 41*  --  43* 46*  CREATININE 1.58* 1.41* 1.41*  --  1.71* 1.78*  CALCIUM 7.4* 7.1* 6.6*  --  6.9* 7.1*  MG 2.4  --   --  2.4  --  2.4  PHOS 3.5 3.2 2.9  --  3.4 3.8    Liver Function Tests:  Recent Labs Lab 07/16/13 0430 07/16/13 1630 07/17/13 0445 07/17/13 0500 07/17/13 1600 07/18/13 0500  AST 1016*  --   --  380*  --   --   ALT 656*  --   --  509*  --   --   ALKPHOS 95  --   --  96  --   --   BILITOT 0.6  --   --  0.5  --   --   PROT 5.6*  --   --  5.6*  --   --   ALBUMIN 2.3* 2.4* 2.3* 2.3* 2.4* 2.4*   No results found for this basename: LIPASE, AMYLASE,  in the last 168 hours No results found for this  basename: AMMONIA,  in the last 168 hours  CBC:  Recent Labs Lab 07/14/13 2340 07/15/13 0920 07/15/13 1845 07/16/13 0430 07/17/13 0500 07/18/13 0500  WBC 14.7* 14.2* 13.3* 13.1* 13.1* 12.2*  NEUTROABS 11.1*  --   --   --   --   --   HGB 5.7* 7.6* 7.7* 7.6* 7.6* 7.6*  HCT 18.2* 22.5* 23.0* 22.7* 23.1* 23.7*  MCV 82.4 81.2 81.6 81.4 84.0 85.9  PLT 363 217 218 190 197 185    Cardiac Enzymes:  Recent Labs Lab 07/12/13 0510 07/14/13 2340 07/15/13 0430 07/15/13 1120 07/16/13 0900  CKTOTAL  --  288*  --   --   --   CKMB  --  10.0*  --   --   --   TROPONINI <0.30 0.58* 3.82* >20.00* 14.50*    BNP: No components found with this basename: POCBNP,   CBG:  Recent Labs Lab 07/17/13 1610 07/17/13 1937  07/18/13 0021 07/18/13 0318 07/18/13 0746  GLUCAP 288* 253* 220* 211* 156*    Microbiology: Results for orders placed during the hospital encounter of 07/03/13  URINE CULTURE     Status: None   Collection Time    07/11/13 12:46 PM      Result Value Range Status   Specimen Description URINE, CATHETERIZED   Final   Special Requests NONE   Final   Culture  Setup Time     Final   Value: 07/11/2013 13:36     Performed at [facility] Count     Final   Value: NO GROWTH     Performed at [facility]   Culture     Final   Value: NO GROWTH     Performed at [facility]   Report Status 07/12/2013 FINAL   Final  MRSA PCR SCREENING     Status: None   Collection Time    07/14/13  9:53 PM      Result Value Range Status   MRSA by PCR NEGATIVE  NEGATIVE Final   Comment:            The GeneXpert MRSA Assay (FDA     approved for NASAL specimens     only), is one component of a     comprehensive MRSA colonization     surveillance program. It is not     intended to diagnose MRSA     infection nor to guide or     monitor treatment for     MRSA infections.    Coagulation Studies: No results found for this basename: LABPROT, INR,  in the  last 72 hours  Urinalysis: No results found for this basename: COLORURINE, APPERANCEUR, LABSPEC, PHURINE, GLUCOSEU, HGBUR, BILIRUBINUR, KETONESUR, PROTEINUR, UROBILINOGEN, NITRITE, LEUKOCYTESUR,  in the last 72 hours    Imaging: Dg Chest Port 1 View  07/17/2013   *RADIOLOGY REPORT*  Clinical Data: Respiratory distress, recent right-sided below-knee amputation, subsequent encounter.  PORTABLE CHEST - 1 VIEW  Comparison: 07/15/2013; 07/14/2013; 07/12/2013  Findings:  The examination is degraded due to positioning and patient body habitus.  Grossly unchanged cardiac silhouette and mediastinal contours. Stable positioning of support apparatus. No pneumothorax.  The pulmonary vasculature remains indistinct with cephalization of flow. Grossly unchanged small bilateral pleural effusions, right greater than left with associated bilateral mid and lower lung heterogeneous / consolidative opacities.  Unchanged bones.  IMPRESSION: Degraded examination with persistent findings of suspected alveolar pulmonary edema, small bilateral effusions and associated bibasilar opacities, atelectasis versus infiltrate.   Original Report Authenticated By: Tacey Ruiz, MD     Medications:   . sodium chloride 10 mL/hr at 07/18/13 0800  . nitroGLYCERIN Stopped (07/18/13 0710)   . antiseptic oral rinse  15 mL Mouth Rinse BID  . carvedilol  3.125 mg Oral BID WC  . ceFEPime (MAXIPIME) IV  2 g Intravenous Q24H  . famotidine (PEPCID) IV  20 mg Intravenous Q12H  . furosemide  160 mg Intravenous Q6H  . hydrALAZINE  10 mg Intravenous Q6H  . insulin aspart  0-15 Units Subcutaneous Q4H  . sodium chloride  3 mL Intravenous Q12H  . vancomycin  1,000 mg Intravenous Q24H   ALPRAZolam, guaiFENesin-dextromethorphan, ondansetron  Assessment/ Plan:  52 yo white male with a history of HTN (>10 yrs), DM (>10 yrs), BKA for staph osteo, NSTEMI, CVA, and CHF and shock. Baseline Creatinine 0.7. Started on CVVHDF. 9/29 for Acute oliguric  renal  failure and Hyperkalemia   1. Acute kidney injury: patient with previously normal renal function. Ischemic ATN and shock  - now off CRRT - Diuresis with IV lasix -renal panels  - avoid nephrotoxic agents  2. Hyperkalemia: to 7.5 at highest. Likely related to AKI, though appears had been on ACEI while at IR and had steady rise in K.  - improved will follow  3. Anemia: baseline appears to be around 12. Had progressive decline in hgb following surgery to a low of 5.7. Now 7.6 s/p transfusion 2 units. Likely related to recent surgery and dilutional with volume overload.  - will continue to monitor at this time  4. CV: currently hypotensive and volume overloaded with 3+ pitting edema and up 30 lbs since pre-op with likely CHF exacerbation given BNP of ~23000. Patient was on lisinopril and metoprolol at IR.  - hold home BP meds  - start IV lasix  - monitor daily weights and I/O's  5. DM: on metformin/glyburide and levemir as outpatient.  - continue SSI per primary team    LOS: 4 Milady Fleener W @TODAY @11 :10 AM

## 2013-07-18 NOTE — Progress Notes (Addendum)
Rehab Admissions Coordinator Note:  Patient was screened by Trish Mage for appropriateness for an Inpatient Acute Rehab Consult.  Patient was recently on inpatient rehab with slow progress.  I will need to check with rehab team to see if re admission is justifiable.  Will follow along.    Lelon Frohlich M 07/18/2013, 3:01 PM  I can be reached at [phone].

## 2013-07-19 DIAGNOSIS — M7989 Other specified soft tissue disorders: Secondary | ICD-10-CM

## 2013-07-19 LAB — BASIC METABOLIC PANEL
BUN: 51 mg/dL — ABNORMAL HIGH (ref 6–23)
CO2: 25 mEq/L (ref 19–32)
Calcium: 7.3 mg/dL — ABNORMAL LOW (ref 8.4–10.5)
Chloride: 97 mEq/L (ref 96–112)
Creatinine, Ser: 1.83 mg/dL — ABNORMAL HIGH (ref 0.50–1.35)
GFR calc Af Amer: 48 mL/min — ABNORMAL LOW (ref >90)
Glucose, Bld: 151 mg/dL — ABNORMAL HIGH (ref 70–99)
Potassium: 4.8 mEq/L (ref 3.5–5.1)

## 2013-07-19 LAB — TYPE AND SCREEN
ABO/RH(D): A POS
Antibody Screen: NEGATIVE
Unit division: 0
Unit division: 0
Unit division: 0

## 2013-07-19 LAB — CBC
Hemoglobin: 7.6 g/dL — ABNORMAL LOW (ref 13.0–17.0)
MCH: 26.9 pg (ref 26.0–34.0)
MCV: 85.9 fL (ref 78.0–100.0)
Platelets: 171 10*3/uL (ref 150–400)
RBC: 2.83 MIL/uL — ABNORMAL LOW (ref 4.22–5.81)
RDW: 17.3 % — ABNORMAL HIGH (ref 11.5–15.5)
WBC: 10.3 10*3/uL (ref 4.0–10.5)

## 2013-07-19 LAB — GLUCOSE, CAPILLARY
Glucose-Capillary: 151 mg/dL — ABNORMAL HIGH (ref 70–99)
Glucose-Capillary: 176 mg/dL — ABNORMAL HIGH (ref 70–99)
Glucose-Capillary: 194 mg/dL — ABNORMAL HIGH (ref 70–99)
Glucose-Capillary: 197 mg/dL — ABNORMAL HIGH (ref 70–99)

## 2013-07-19 MED ORDER — CARVEDILOL 6.25 MG PO TABS
6.2500 mg | ORAL_TABLET | Freq: Two times a day (BID) | ORAL | Status: DC
  Administered 2013-07-19 – 2013-07-21 (×4): 6.25 mg via ORAL
  Filled 2013-07-19 (×6): qty 1

## 2013-07-19 MED ORDER — INSULIN ASPART 100 UNIT/ML ~~LOC~~ SOLN
0.0000 [IU] | Freq: Three times a day (TID) | SUBCUTANEOUS | Status: DC
Start: 2013-07-19 — End: 2013-07-26
  Administered 2013-07-19 – 2013-07-20 (×3): 3 [IU] via SUBCUTANEOUS
  Administered 2013-07-20 – 2013-07-21 (×4): 5 [IU] via SUBCUTANEOUS
  Administered 2013-07-22 – 2013-07-23 (×4): 2 [IU] via SUBCUTANEOUS
  Administered 2013-07-24: 3 [IU] via SUBCUTANEOUS
  Administered 2013-07-24 – 2013-07-25 (×4): 2 [IU] via SUBCUTANEOUS

## 2013-07-19 MED ORDER — CEFAZOLIN SODIUM-DEXTROSE 2-3 GM-% IV SOLR
2.0000 g | Freq: Three times a day (TID) | INTRAVENOUS | Status: AC
  Administered 2013-07-19 – 2013-07-26 (×21): 2 g via INTRAVENOUS
  Filled 2013-07-19 (×23): qty 50

## 2013-07-19 MED ORDER — FAMOTIDINE 20 MG PO TABS
20.0000 mg | ORAL_TABLET | Freq: Two times a day (BID) | ORAL | Status: DC
  Administered 2013-07-19 – 2013-07-26 (×14): 20 mg via ORAL
  Filled 2013-07-19 (×16): qty 1

## 2013-07-19 NOTE — Progress Notes (Signed)
Rehab admissions - I spoke with Dr. Wynn Banker today.  I is likely that patient will need to re admit to acute inpatient rehab.  I will need PT and OT updates and preauthorization from [facility] priort to return to inpatient rehab.  Patient not medically ready today for rehab.  I will follow up on Monday for progress and rehab needs.  Call me for questions.  #[phone]

## 2013-07-19 NOTE — Progress Notes (Signed)
VASCULAR LAB PRELIMINARY  PRELIMINARY  PRELIMINARY  PRELIMINARY  Right upper extremity venous duplex completed.    Preliminary report:  Right:  No evidence of DVT or superficial thrombosis.  Interstitial fluid noted throughout the arm.  Larin Depaoli, RVT 07/19/2013, 2:06 PM

## 2013-07-19 NOTE — Progress Notes (Signed)
[facility] ROUNDING NOTE   Subjective:   Interval History: uneventful night no issues this morning  Objective:  Vital signs in last 24 hours:  Temp:  [97.4 F (36.3 C)-97.7 F (36.5 C)] 97.5 F (36.4 C) (10/03 0800) Pulse Rate:  [116] 116 (10/02 1414) Resp:  [11-24] 21 (10/03 1000) BP: (97-152)/(66-103) 97/72 mmHg (10/03 1000) SpO2:  [90 %-100 %] 97 % (10/03 1000) Weight:  [135.5 kg (298 lb 11.6 oz)] 135.5 kg (298 lb 11.6 oz) (10/03 0429)  Weight change: 2.4 kg (5 lb 4.7 oz) Filed Weights   07/17/13 0500 07/18/13 0500 07/19/13 0429  Weight: 135.1 kg (297 lb 13.5 oz) 133.1 kg (293 lb 6.9 oz) 135.5 kg (298 lb 11.6 oz)    Intake/Output: I/O last 3 completed shifts: In: 2021 [P.O.:840; I.V.:417; IV Piggyback:764] Out: 3465 [Urine:3465]   Intake/Output this shift:  Total I/O In: 70 [I.V.:20; IV Piggyback:50] Out: 195 [Urine:195]  CVS- RRR  RS- CTA bibasilar rales  ABD- BS present soft non-distended  EXT- anasarca    Basic Metabolic Panel:  Recent Labs Lab 07/16/13 0430 07/16/13 1630 07/17/13 0445 07/17/13 0500 07/17/13 1600 07/18/13 0500 07/19/13 0445  NA 132* 132* 132*  --  129* 131* 131*  K 5.5* 5.3* 5.3*  --  5.7* 5.1 4.8  CL 100 99 100  --  98 98 97  CO2 24 23 24   --  23 25 25   GLUCOSE 166* 191* 162*  --  296* 189* 151*  BUN 60* 48* 41*  --  43* 46* 51*  CREATININE 1.58* 1.41* 1.41*  --  1.71* 1.78* 1.83*  CALCIUM 7.4* 7.1* 6.6*  --  6.9* 7.1* 7.3*  MG 2.4  --   --  2.4  --  2.4 2.3  PHOS 3.5 3.2 2.9  --  3.4 3.8 3.7    Liver Function Tests:  Recent Labs Lab 07/16/13 0430 07/16/13 1630 07/17/13 0445 07/17/13 0500 07/17/13 1600 07/18/13 0500  AST 1016*  --   --  380*  --   --   ALT 656*  --   --  509*  --   --   ALKPHOS 95  --   --  96  --   --   BILITOT 0.6  --   --  0.5  --   --   PROT 5.6*  --   --  5.6*  --   --   ALBUMIN 2.3* 2.4* 2.3* 2.3* 2.4* 2.4*   No results found for this basename: LIPASE, AMYLASE,  in the last  168 hours No results found for this basename: AMMONIA,  in the last 168 hours  CBC:  Recent Labs Lab 07/14/13 2340  07/15/13 1845 07/16/13 0430 07/17/13 0500 07/18/13 0500 07/19/13 0445  WBC 14.7*  < > 13.3* 13.1* 13.1* 12.2* 10.3  NEUTROABS 11.1*  --   --   --   --   --   --   HGB 5.7*  < > 7.7* 7.6* 7.6* 7.6* 7.6*  HCT 18.2*  < > 23.0* 22.7* 23.1* 23.7* 24.3*  MCV 82.4  < > 81.6 81.4 84.0 85.9 85.9  PLT 363  < > 218 190 197 185 171  < > = values in this interval not displayed.  Cardiac Enzymes:  Recent Labs Lab 07/14/13 2340 07/15/13 0430 07/15/13 1120 07/16/13 0900  CKTOTAL 288*  --   --   --   CKMB 10.0*  --   --   --  TROPONINI 0.58* 3.82* >20.00* 14.50*    BNP: No components found with this basename: POCBNP,   CBG:  Recent Labs Lab 07/18/13 1556 07/18/13 1936 07/18/13 2358 07/19/13 0419 07/19/13 0800  GLUCAP 212* 254* 197* 151* 129*    Microbiology: Results for orders placed during the hospital encounter of 07/03/13  URINE CULTURE     Status: None   Collection Time    07/11/13 12:46 PM      Result Value Range Status   Specimen Description URINE, CATHETERIZED   Final   Special Requests NONE   Final   Culture  Setup Time     Final   Value: 07/11/2013 13:36     Performed at [facility] Count     Final   Value: NO GROWTH     Performed at [facility]   Culture     Final   Value: NO GROWTH     Performed at [facility]   Report Status 07/12/2013 FINAL   Final  MRSA PCR SCREENING     Status: None   Collection Time    07/14/13  9:53 PM      Result Value Range Status   MRSA by PCR NEGATIVE  NEGATIVE Final   Comment:            The GeneXpert MRSA Assay (FDA     approved for NASAL specimens     only), is one component of a     comprehensive MRSA colonization     surveillance program. It is not     intended to diagnose MRSA     infection nor to guide or     monitor treatment for     MRSA infections.     Coagulation Studies: No results found for this basename: LABPROT, INR,  in the last 72 hours  Urinalysis: No results found for this basename: COLORURINE, APPERANCEUR, LABSPEC, PHURINE, GLUCOSEU, HGBUR, BILIRUBINUR, KETONESUR, PROTEINUR, UROBILINOGEN, NITRITE, LEUKOCYTESUR,  in the last 72 hours    Imaging: Dg Chest Port 1 View  07/17/2013   *RADIOLOGY REPORT*  Clinical Data: Respiratory distress, recent right-sided below-knee amputation, subsequent encounter.  PORTABLE CHEST - 1 VIEW  Comparison: 07/15/2013; 07/14/2013; 07/12/2013  Findings:  The examination is degraded due to positioning and patient body habitus.  Grossly unchanged cardiac silhouette and mediastinal contours. Stable positioning of support apparatus. No pneumothorax.  The pulmonary vasculature remains indistinct with cephalization of flow. Grossly unchanged small bilateral pleural effusions, right greater than left with associated bilateral mid and lower lung heterogeneous / consolidative opacities.  Unchanged bones.  IMPRESSION: Degraded examination with persistent findings of suspected alveolar pulmonary edema, small bilateral effusions and associated bibasilar opacities, atelectasis versus infiltrate.   Original Report Authenticated By: Tacey Ruiz, MD     Medications:   . sodium chloride 10 mL/hr at 07/18/13 0800  . nitroGLYCERIN Stopped (07/18/13 0710)   . antiseptic oral rinse  15 mL Mouth Rinse BID  . carvedilol  6.25 mg Oral BID WC  . ceFEPime (MAXIPIME) IV  2 g Intravenous Q24H  . famotidine (PEPCID) IV  20 mg Intravenous Q12H  . furosemide  160 mg Intravenous Q6H  . hydrALAZINE  10 mg Intravenous Q6H  . insulin aspart  0-15 Units Subcutaneous Q4H  . insulin glargine  15 Units Subcutaneous QHS  . sodium chloride  3 mL Intravenous Q12H   ALPRAZolam, guaiFENesin-dextromethorphan, ondansetron  Assessment/ Plan:  52 yo white male with a history of  HTN (>10 yrs), DM (>10 yrs), BKA for staph osteo, NSTEMI,  CVA, and CHF and shock. Baseline Creatinine 0.7. Started on CVVHDF. 9/29 for Acute oliguric renal failure and Hyperkalemia   1. Acute kidney injury: patient with previously normal renal function. Ischemic ATN and shock  - now off CRRT  - Diuresis with IV lasix  -renal panels  - avoid nephrotoxic agents  2. Hyperkalemia: to 7.5 at highest. Likely related to AKI, though appears had been on ACEI while at IR and had steady rise in K.  - improved will follow  3. Anemia: baseline appears to be around 12. Had progressive decline in hgb following surgery to a low of 5.7. Now 7.6 s/p transfusion 2 units. Likely related to recent surgery and dilutional with volume overload.  - will continue to monitor at this time  4. CV: currently hypotensive and volume overloaded with 3+ pitting edema and up 30 lbs since pre-op with likely CHF exacerbation given BNP of ~23000. Patient was on lisinopril and metoprolol at IR.  - hold home BP meds  - continue IV lasix  - monitor daily weights and I/O's  5. DM: on metformin/glyburide and levemir as outpatient.  - continue SSI per primary team  Will sign off, it appears that patient is mobilizing his fluid and anticipate recovery. Please call us back if needed. Discussed with Dr Craige Cotta   LOS: 5 Sharaya Boruff W @TODAY @11 :16 AM

## 2013-07-19 NOTE — Progress Notes (Addendum)
Name: Albert [name] MRN: [id number] DOB: October 25, 1960    LOS: 5  Referring Provider:  Dr/ paya Reason for Referral:  Hypoxemia and hypotension  PULMONARY / CRITICAL CARE MEDICINE Brief patient description:  52 year-old status post BKA for gangrenous foot and staph osteomyelitis + bacteremia, acute CHF, symptomatic anemia.  Cultures: MRSA pCR 9/28: positive   Antibiotics: Ancef, start date 06/29/2013 Date 07/26/2013 Cefepime 9/26>>> vanc 9/26>>>   Events Since Admission: Transferred from rehabilitation on 07/14/2013 Transferred to PCCM and on 07/14/2013 Started CVVHD 9/29  Current Status: Stopped CVVH on 10/1. Currently on RA saturating 95%. Urinary output 2500 cc from previous day. Oral intake good. Step down unit canidate.     Temp:  [97.4 F (36.3 C)-97.8 F (36.6 C)] 97.8 F (36.6 C) (10/03 1100) Pulse Rate:  [116] 116 (10/02 1414) Resp:  [11-24] 20 (10/03 1100) BP: (97-152)/(66-103) 130/78 mmHg (10/03 1226) SpO2:  [90 %-100 %] 96 % (10/03 1100) Weight:  [135.5 kg (298 lb 11.6 oz)] 135.5 kg (298 lb 11.6 oz) (10/03 0429)  Intake/Output Summary (Last 24 hours) at 07/19/13 1305 Last data filed at 07/19/13 1200  Gross per 24 hour  Intake   1104 ml  Output   3120 ml  Net  -2016 ml   Physical Examination: General:  Obese male, laying in bed, comfortable Neuro:  Alert and oriented x3, CN II through XII intact HEENT:  Pupils equally round and reactive to light, OP clear. Horse voice.  Neck:  Supple, no masses or lesions, JVD to the ear Cardiovascular:  Tachycardic, regular rate, no murmurs rubs or gallops Lungs:  Lung sounds diminished bilaterally more so on the right Abdomen:  Obese, nontender nondistended, positive bowel sounds Musculoskeletal:  4+ pitting edema, no clubbing, R amputation looks CDI Skin:  Bruising over left knee , dressings clean and intact  CBC Recent Labs     07/17/13  0500  07/18/13  0500  07/19/13  0445  WBC  13.1*  12.2*  10.3  HGB   7.6*  7.6*  7.6*  HCT  23.1*  23.7*  24.3*  PLT  197  185  171    Coag's No results found for this basename: APTT, INR,  in the last 72 hours  BMET Recent Labs     07/17/13  1600  07/18/13  0500  07/19/13  0445  NA  129*  131*  131*  K  5.7*  5.1  4.8  CL  98  98  97  CO2  23  25  25   BUN  43*  46*  51*  CREATININE  1.71*  1.78*  1.83*  GLUCOSE  296*  189*  151*    Electrolytes Recent Labs     07/17/13  0500  07/17/13  1600  07/18/13  0500  07/19/13  0445  CALCIUM   --   6.9*  7.1*  7.3*  MG  2.4   --   2.4  2.3  PHOS   --   3.4  3.8  3.7    Sepsis Markers No results found for this basename: LACTICACIDVEN, PROCALCITON, O2SATVEN,  in the last 72 hours  ABG No results found for this basename: PHART, PCO2ART, PO2ART,  in the last 72 hours  Liver Enzymes Recent Labs     07/17/13  0500  07/17/13  1600  07/18/13  0500  AST  380*   --    --   ALT  509*   --    --  ALKPHOS  96   --    --   BILITOT  0.5   --    --   ALBUMIN  2.3*  2.4*  2.4*    Cardiac Enzymes No results found for this basename: TROPONINI, PROBNP,  in the last 72 hours  Glucose Recent Labs     07/18/13  1556  07/18/13  1936  07/18/13  2358  07/19/13  0419  07/19/13  0800  07/19/13  1202  GLUCAP  212*  254*  197*  151*  129*  176*    Imaging Dg Chest Port 1 View  07/17/2013   *RADIOLOGY REPORT*  Clinical Data: Respiratory distress, recent right-sided below-knee amputation, subsequent encounter.  PORTABLE CHEST - 1 VIEW  Comparison: 07/15/2013; 07/14/2013; 07/12/2013  Findings:  The examination is degraded due to positioning and patient body habitus.  Grossly unchanged cardiac silhouette and mediastinal contours. Stable positioning of support apparatus. No pneumothorax.  The pulmonary vasculature remains indistinct with cephalization of flow. Grossly unchanged small bilateral pleural effusions, right greater than left with associated bilateral mid and lower lung heterogeneous /  consolidative opacities.  Unchanged bones.  IMPRESSION: Degraded examination with persistent findings of suspected alveolar pulmonary edema, small bilateral effusions and associated bibasilar opacities, atelectasis versus infiltrate.   Original Report Authenticated By: Tacey Ruiz, MD        ETT:  None   Pulmonary:  A:   Bilateral pleural effusions. Pulmonary congestion improved with cardiovascular stabilization P:   - PRN O2 per [place] to maintain SpO2.  - cont diuresis   Cardiology:  A:  Sinus Tachycardia hypertension  right upper extremity edema P:  - Upper Extremity Doppler study to right arm  - Titrate Carvedilol to 6.25 mg BID today  - Cardiology following  RENAL A:   Acute renal failure, ATN.  Mild hyperkalemia  Hyperkalemia resolved.  Presume ATN secondary to anemia and CHF, appears to be stabilizing P:  - Off CVVHD and using high dose lasix per renal - See anemia section   GASTROINTESTINAL A:    Nausea Transaminitis, likely shock liver: looking better, hepatitis panel all negative P:   -Follow LFT intermittently -Continue PRN zofran   HEMATOLOGIC A:   Anemia, improving  Likely secondary to acute bleeding after fall Transfused 2 units 9/29, follow CBC P: -Monitor anemia. Transfuse if Hbg falls to 7.0. or below.  INFECTIOUS A:   History of MSSA infection Possible HCAP P: Transition back to ancef 10/03 >> needs total of 28 days Abx from 06/25/13  ENDOCRINE A:   Diabetes   P:   Patient on sliding scale insulin Continue Lantus 15 units [place] qhs  NEUROLOGIC A:   No current problems P:   Supportive care   Transfer to SDU 10/3-->can go to IM service (have asked [facility])  Pedro Earls NP-S  07/19/2013, 12:16 PM [facility] [phone] or if no answer [phone]

## 2013-07-19 NOTE — Progress Notes (Addendum)
Subjective:  Patient had uneventful night. No chest pain.  Hgb stable 7.6. No evidence of any active bleeding at this time. Tolerating oral fluids.  Objective:  Vital Signs in the last 24 hours: Temp:  [97.4 F (36.3 C)-97.7 F (36.5 C)] 97.5 F (36.4 C) (10/03 0800) Pulse Rate:  [116] 116 (10/02 1414) Resp:  [11-24] 13 (10/03 0800) BP: (102-152)/(66-103) 132/88 mmHg (10/03 0800) SpO2:  [90 %-100 %] 95 % (10/03 0800) Weight:  [298 lb 11.6 oz (135.5 kg)] 298 lb 11.6 oz (135.5 kg) (10/03 0429)  Intake/Output from previous day: 10/02 0701 - 10/03 0700 In: 1794 [P.O.:840; I.V.:240; IV Piggyback:714] Out: 2555 [Urine:2555]  Intake/Output from this shift: Total I/O In: 10 [I.V.:10] Out: 45 [Urine:45]  . antiseptic oral rinse  15 mL Mouth Rinse BID  . carvedilol  3.125 mg Oral BID WC  . ceFEPime (MAXIPIME) IV  2 g Intravenous Q24H  . famotidine (PEPCID) IV  20 mg Intravenous Q12H  . furosemide  160 mg Intravenous Q6H  . hydrALAZINE  10 mg Intravenous Q6H  . insulin aspart  0-15 Units Subcutaneous Q4H  . insulin glargine  15 Units Subcutaneous QHS  . sodium chloride  3 mL Intravenous Q12H   . sodium chloride 10 mL/hr at 07/18/13 0800  . nitroGLYCERIN Stopped (07/18/13 0710)    Physical Exam: General: Well developed WM in no acute distress, appearing chronically ill.  Nasal oxygen in place, not tachypneic  Head: Normocephalic, atraumatic, sclera non-icteric, no xanthomas, nares are without discharge.  Neck: JVD moderately elevated.  Lungs: Bilateral expiratory wheezing. Heart: RRR S1 S2 without murmurs, rubs, or gallops. Tachycardia. Abdomen: Soft, non-tender, rounded with normoactive bowel sounds. No hepatomegaly. No rebound/guarding. No obvious abdominal masses.  Msk: Strength and tone appear normal for age.  Extremities: No clubbing or cyanosis. Moderate Left lower leg edema. Neuro: Alert and oriented X 3. Moves all extremities spontaneously.  Psych: Responds to  questions appropriately with a normal affect.    Recent Labs  07/18/13 0500 07/19/13 0445  WBC 12.2* 10.3  HGB 7.6* 7.6*  PLT 185 171    Recent Labs  07/18/13 0500 07/19/13 0445  NA 131* 131*  K 5.1 4.8  CL 98 97  CO2 25 25  GLUCOSE 189* 151*  BUN 46* 51*  CREATININE 1.78* 1.83*    Recent Labs  07/16/13 0900  TROPONINI 14.50*   Hepatic Function Panel  Recent Labs  07/17/13 0500  07/18/13 0500  PROT 5.6*  --   --   ALBUMIN 2.3*  < > 2.4*  AST 380*  --   --   ALT 509*  --   --   ALKPHOS 96  --   --   BILITOT 0.5  --   --   BILIDIR 0.2  --   --   IBILI 0.3  --   --   < > = values in this interval not displayed. No results found for this basename: CHOL,  in the last 72 hours No results found for this basename: PROTIME,  in the last 72 hours  Imaging: Dg Chest Port 1 View  07/17/2013   *RADIOLOGY REPORT*  Clinical Data: Respiratory distress, recent right-sided below-knee amputation, subsequent encounter.  PORTABLE CHEST - 1 VIEW  Comparison: 07/15/2013; 07/14/2013; 07/12/2013  Findings:  The examination is degraded due to positioning and patient body habitus.  Grossly unchanged cardiac silhouette and mediastinal contours. Stable positioning of support apparatus. No pneumothorax.  The pulmonary vasculature  remains indistinct with cephalization of flow. Grossly unchanged small bilateral pleural effusions, right greater than left with associated bilateral mid and lower lung heterogeneous / consolidative opacities.  Unchanged bones.  IMPRESSION: Degraded examination with persistent findings of suspected alveolar pulmonary edema, small bilateral effusions and associated bibasilar opacities, atelectasis versus infiltrate.   Original Report Authenticated By: Tacey Ruiz, MD    Cardiac Studies: 2-D echo on 07/15/13: Study Conclusions  - Left ventricle: The cavity size was normal. Wall thickness was increased in a pattern of moderate LVH. Systolic function was mildly to  moderately reduced. The estimated ejection fraction was in the range of 40% to 45%. Doppler parameters are consistent with restrictive physiology, indicative of decreased left ventricular diastolic compliance and/or increased left atrial pressure. - Left atrium: The atrium was mildly dilated. - Pulmonary arteries: Systolic pressure was mildly increased. - Pericardium, extracardiac: A small pericardial effusion was identified. Impressions:  - Extremely limited due to poor sound wave transmission; there appears to be hypokinesis of the inferoposterior wall with mild to moderate reduction in LV function; suggest TEE or cardiac MRI if clinically indicated.  Assessment/Plan:    LOS: 6 days   1. Recent sepsis/MSSA bacteremia (adm 9/9->9/17->CIR) from gangrenous R foot s/p R BKA 06/29/13, TEE negative for bacteremia 07/03/13   2. 3-vessel CAD by cath 06/28/13 (inital plan was for CABG once recovered from BKA), now with 2nd NSTEMI (peak troponin 15 on 06/26/13 that had normalized. Second NSTEMI with peak troponin greater than 20, now downtrending, yesterday 14.5. NSTEMI secondary to demand ischemia.  We will uptitrate Carvedilol to 6.25 mg BID today (stable BP, sinus tachycardia).   3. Ischemic cardiomyopathy with acute systolic CHF (EF 96-04% by echo on 07/15/13. Moderate mitral regurgitation by TEE 07/03/13   4. Acute hypoxic respiratory failure secondary to anemia, pulmonary edema, and bilateral pleural effusions   5. Hypotension due to circulatory shock, now stable off pressors.   6. Acute symptomatic anemia.  Hemoglobin stable overnight  7. Acute renal failure with oliguria, now on CVVHD  8. Diabetes mellitus, uncontrolled    Tiffony Kite, H 07/19/2013, 8:21 AM

## 2013-07-19 NOTE — Progress Notes (Signed)
ANTIBIOTIC CONSULT NOTE - FOLLOW UP  Pharmacy Consult for Ancef Indication: MSSA bacteremia  Allergies  Allergen Reactions  . Amoxicillin Nausea And Vomiting    Patient Measurements: Height: 5\' 10"  (177.8 cm) Weight: 298 lb 11.6 oz (135.5 kg) IBW/kg (Calculated) : 73  Vital Signs: Temp: 97.8 F (36.6 C) (10/03 1100) Temp src: Oral (10/03 0800) BP: 130/78 mmHg (10/03 1226) Intake/Output from previous day: 10/02 0701 - 10/03 0700 In: 1794 [P.O.:840; I.V.:240; IV Piggyback:714] Out: 2555 [Urine:2555] Intake/Output from this shift: Total I/O In: 336 [P.O.:120; I.V.:50; IV Piggyback:166] Out: 895 [Urine:895]  Labs:  Recent Labs  07/17/13 0500 07/17/13 1600 07/18/13 0500 07/19/13 0445  WBC 13.1*  --  12.2* 10.3  HGB 7.6*  --  7.6* 7.6*  PLT 197  --  185 171  CREATININE  --  1.71* 1.78* 1.83*   Estimated Creatinine Clearance: 66.9 ml/min (by C-G formula based on Cr of 1.83).  Recent Labs  07/18/13 0500  VANCORANDOM 12.3     Microbiology: Recent Results (from the past 720 hour(s))  CULTURE, BLOOD (ROUTINE X 2)     Status: None   Collection Time    06/26/13  1:00 AM      Result Value Range Status   Specimen Description BLOOD RIGHT ARM   Final   Special Requests     Final   Value: BOTTLES DRAWN AEROBIC AND ANAEROBIC  ,   Culture  Setup Time     Final   Value: 06/26/2013 04:11     Performed at [facility]   Culture     Final   Value: STAPHYLOCOCCUS AUREUS     Note: SUSCEPTIBILITIES PERFORMED ON PREVIOUS CULTURE WITHIN THE LAST 5 DAYS.     Note: Gram Stain Report Called to,Read Back By and Verified With: STACY PHELPS 06/28/13 1155 BY SMITHERSJ     Performed at [facility]   Report Status 06/30/2013 FINAL   Final  CULTURE, BLOOD (ROUTINE X 2)     Status: None   Collection Time    06/26/13  1:08 AM      Result Value Range Status   Specimen Description BLOOD RIGHT ARM   Final   Special Requests BOTTLES DRAWN AEROBIC AND  ANAEROBIC    Final   Culture  Setup Time     Final   Value: 06/26/2013 04:10     Performed at [facility]   Culture     Final   Value: STAPHYLOCOCCUS AUREUS     Note: RIFAMPIN AND GENTAMICIN SHOULD NOT BE USED AS SINGLE DRUGS FOR TREATMENT OF STAPH INFECTIONS.     Note: Gram Stain Report Called to,Read Back By and Verified With: JOHN HUNT ON 06/28/2013 AT 8:21P BY Serafina Mitchell     Performed at [facility]   Report Status 06/30/2013 FINAL   Final   Organism ID, Bacteria STAPHYLOCOCCUS AUREUS   Final  MRSA PCR SCREENING     Status: None   Collection Time    06/26/13  8:08 AM      Result Value Range Status   MRSA by PCR NEGATIVE  NEGATIVE Final   Comment:            The GeneXpert MRSA Assay (FDA     approved for NASAL specimens     only), is one component of a     comprehensive MRSA colonization     surveillance program. It is not     intended to diagnose  MRSA     infection nor to guide or     monitor treatment for     MRSA infections.  URINE CULTURE     Status: None   Collection Time    06/28/13  6:05 PM      Result Value Range Status   Specimen Description URINE, CATHETERIZED   Final   Special Requests Normal   Final   Culture  Setup Time     Final   Value: 06/28/2013 18:30     Performed at [facility] Count     Final   Value: NO GROWTH     Performed at [facility]   Culture     Final   Value: NO GROWTH     Performed at [facility]   Report Status 06/30/2013 FINAL   Final  CULTURE, BLOOD (ROUTINE X 2)     Status: None   Collection Time    06/29/13  3:10 PM      Result Value Range Status   Specimen Description BLOOD RIGHT ANTECUBITAL   Final   Special Requests BOTTLES DRAWN AEROBIC AND ANAEROBIC 10CC   Final   Culture  Setup Time     Final   Value: 06/29/2013 21:30     Performed at [facility]   Culture     Final   Value: NO GROWTH 5 DAYS     Performed at [facility]   Report Status 07/05/2013  FINAL   Final  CULTURE, BLOOD (ROUTINE X 2)     Status: None   Collection Time    06/29/13  3:20 PM      Result Value Range Status   Specimen Description BLOOD RIGHT HAND   Final   Special Requests BOTTLES DRAWN AEROBIC AND ANAEROBIC 10CC   Final   Culture  Setup Time     Final   Value: 06/29/2013 21:31     Performed at [facility]   Culture     Final   Value: NO GROWTH 5 DAYS     Performed at [facility]   Report Status 07/05/2013 FINAL   Final  URINE CULTURE     Status: None   Collection Time    07/11/13 12:46 PM      Result Value Range Status   Specimen Description URINE, CATHETERIZED   Final   Special Requests NONE   Final   Culture  Setup Time     Final   Value: 07/11/2013 13:36     Performed at [facility] Count     Final   Value: NO GROWTH     Performed at [facility]   Culture     Final   Value: NO GROWTH     Performed at [facility]   Report Status 07/12/2013 FINAL   Final  MRSA PCR SCREENING     Status: None   Collection Time    07/14/13  9:53 PM      Result Value Range Status   MRSA by PCR NEGATIVE  NEGATIVE Final   Comment:            The GeneXpert MRSA Assay (FDA     approved for NASAL specimens     only), is one component of a     comprehensive MRSA colonization     surveillance program. It is not     intended to diagnose MRSA  infection nor to guide or     monitor treatment for     MRSA infections.    Assessment: 52yom started on cefazolin for MSSA bacteremia, then broadened to cover for HCAP, now to switch back to cefazolin to complete treatment. He was started on CRRT on 9/29 but is now off of it. Serum creatinine continues to trend up a little with CrCl 20ml/min.   Cefazolin 9/17>>9/25>>resume 10/3 through 10/10 Cefepime 9/26>>10/3 Levaquin PO 9/25>>9/27 Zosyn 9/25 x 1 dose Vanc 9/26 (on rehab)>>10/3  Goal of Therapy:  Appropriate cefazolin dosing  Plan:  1) Cefazolin 2g IV q8 2)  Will place stop date of 10/10 3) Continue to follow renal function and adjust dose if necessary  Fredrik Rigger 07/19/2013,1:47 PM

## 2013-07-20 DIAGNOSIS — A4901 Methicillin susceptible Staphylococcus aureus infection, unspecified site: Secondary | ICD-10-CM

## 2013-07-20 DIAGNOSIS — R7881 Bacteremia: Secondary | ICD-10-CM

## 2013-07-20 LAB — GLUCOSE, CAPILLARY
Glucose-Capillary: 192 mg/dL — ABNORMAL HIGH (ref 70–99)
Glucose-Capillary: 170 mg/dL — ABNORMAL HIGH (ref 70–99)
Glucose-Capillary: 189 mg/dL — ABNORMAL HIGH (ref 70–99)
Glucose-Capillary: 209 mg/dL — ABNORMAL HIGH (ref 70–99)
Glucose-Capillary: 210 mg/dL — ABNORMAL HIGH (ref 70–99)

## 2013-07-20 LAB — BASIC METABOLIC PANEL
CO2: 24 mEq/L (ref 19–32)
Chloride: 98 mEq/L (ref 96–112)
Sodium: 134 mEq/L — ABNORMAL LOW (ref 135–145)

## 2013-07-20 LAB — CBC
HCT: 24.2 % — ABNORMAL LOW (ref 39.0–52.0)
MCHC: 31.4 g/dL (ref 30.0–36.0)
MCV: 86.1 fL (ref 78.0–100.0)
RBC: 2.81 MIL/uL — ABNORMAL LOW (ref 4.22–5.81)
WBC: 9.1 10*3/uL (ref 4.0–10.5)

## 2013-07-20 MED ORDER — HYDRALAZINE HCL 10 MG PO TABS
10.0000 mg | ORAL_TABLET | Freq: Four times a day (QID) | ORAL | Status: DC
  Administered 2013-07-20 – 2013-07-22 (×8): 10 mg via ORAL
  Filled 2013-07-20 (×12): qty 1

## 2013-07-20 MED ORDER — INSULIN GLARGINE 100 UNIT/ML ~~LOC~~ SOLN
22.0000 [IU] | Freq: Every day | SUBCUTANEOUS | Status: DC
  Administered 2013-07-20: 22 [IU] via SUBCUTANEOUS
  Filled 2013-07-20 (×2): qty 0.22

## 2013-07-20 MED ORDER — ZOLPIDEM TARTRATE 5 MG PO TABS
5.0000 mg | ORAL_TABLET | Freq: Every evening | ORAL | Status: DC | PRN
  Administered 2013-07-20 – 2013-07-25 (×6): 5 mg via ORAL
  Filled 2013-07-20 (×6): qty 1

## 2013-07-20 NOTE — Progress Notes (Addendum)
[facility] Progress Note [place] TEAM 1 - Stepdown/ICU TEAM   RESHARD [name]:[id number] DOB: 1961/10/01 DOA: 07/14/2013 PCP: Rollene Rotunda, MD  Admit HPI / Brief Narrative: 52 yr old WM w/ a pmhx significant for HTN, CVA, ISCM, originally admitted 06/25/2013 with SIRS and MSSA bacteremia from gangrenous right foot, s/p BKA R 9/13. At the time, he became hypotensive and suffered a NSTEMI. He underwent LHC with findings of 3v CAD with an EF of 40% on 06/28/2013. There were plans to perform CABG once he stabilized. He was transferred to the rehab floor 9/17 and then developed increasing SOB with increased weight (10 lbs over approx one week). He received IV lasix with only temporary improvement in his SOB. He was subsequently started on Cefepime and Vancomcyn for a concern of HCAP. His dose of Vancomycin was reduced due to worsening renal function. It was noted that the patient was on Ancef for MSSA bacteremia per ID (9/13 start date with plans to continue until 10/10) but this was discontinued when coverage was broadened for HCAP.   On 07/14/2013 RRT was called to the rehab unit due to patient complaining of feeling unwell. He was found to have a BP of 85/57. He was noted to have decreased UOP with only 100 cc over about 7 hours. He was given a NS 250 cc bolus and transferred to the SDU.  BMET revealed a K of 5.9. EKG was performed with no evidence of peaked T waves. He was noted to have a Cr of 1.77, increased from 1.25 on 9/27. Later in that day, he developed hypoxic resp failure, with sats dropping into the mid 80% range despite NRB.  He was subsequently transferred to the ICU on the PCCM service.    Assessment/Plan:  Recent sepsis/MSSA bacteremia from gangrenous R foot s/p R BKA 06/29/13 admit 9/9->9/17->CIR - TEE negative for bacteremia 07/03/13 - to complete abx course 10/10  3-vessel CAD by cath 06/28/13 now with 2nd NSTEMI inital plan was for CABG once recovered from BKA - Cardiology  following   Acute hypoxic respiratory failure secondary to anemia and volume overload Resolved - sats in mid 90s on RA  B Pleural effusions  F/u CXR in AM - clinically stabilizing   RUE edema  Persists - no DVT on doppler - elevate - follow   Sinus tachycardia  Persists - follow  Ischemic cardiomyopathy with acute systolic CHF EF 40% by echo 9/10, 45-50% by TEE 9/17 - Cardiology following   Hypotension Resolved  Transaminitis likely shock liver - improving - f/u in AM  Acute blood loss anemia - bleeding from surgical site s/p fall  baseline appears to be around 12 - s/p transfusion - stable for now - follow   Acute kidney injury Nephrology following - required transient CVVHD - previously normal renal function - cont high dose lasix - UOP consistent for now  Hyperkalemia Resolved  Diabetes mellitus CBG not at goal - adjust tx and follow  Moderate mitral regurgitation by TEE 07/03/13  Code Status: FULL Family Communication: no family present at time of exam Disposition Plan: SDU  Consultants: Cardiology - [facility] PCCM >> [facility] Nephrology  Procedures: CVVHD 9/29 >> 10/1 10/3 - RUE doppler - no DVT  Antibiotics: Ancef 06/29/2013 >> 07/11/2013 + 10/03 >> 07/26/2013 (planned) Cefepime 9/26>>> 10/03 Vanc 9/25 >>> 10/02  DVT prophylaxis: SCDs only due to recent bleeding   HPI/Subjective: The patient is alert and oriented.  He has no complaints  at the present time.  He denies chest pain shortness of breath fevers chills nausea or vomiting.  Objective: Blood pressure 118/81, pulse 109, temperature 97.7 F (36.5 C), temperature source Oral, resp. rate 13, height 5\' 10"  (1.778 m), weight 138.7 kg (305 lb 12.5 oz), SpO2 94.00%.  Intake/Output Summary (Last 24 hours) at 07/20/13 0904 Last data filed at 07/20/13 0800  Gross per 24 hour  Intake    619 ml  Output   3625 ml  Net  -3006 ml   Exam: General: No acute respiratory distress Access: Right  neck hemodialysis catheter with pigtail, left upper extremity PICC line Lungs: Fine crackles scattered throughout with no wheeze Cardiovascular: Tachycardic but regular without gallop or rub appreciable Abdomen: Nontender, nondistended, soft, bowel sounds positive, no rebound, no ascites, no appreciable mass Extremities: Bilateral lower should a wound stressed and dry, diffuse edema at 2+ with right upper extremity 3+  Data Reviewed: Basic Metabolic Panel:  Recent Labs Lab 07/16/13 0430 07/16/13 1630 07/17/13 0445 07/17/13 0500 07/17/13 1600 07/18/13 0500 07/19/13 0445 07/20/13 0450  NA 132* 132* 132*  --  129* 131* 131* 134*  K 5.5* 5.3* 5.3*  --  5.7* 5.1 4.8 4.7  CL 100 99 100  --  98 98 97 98  CO2 24 23 24   --  23 25 25 24   GLUCOSE 166* 191* 162*  --  296* 189* 151* 187*  BUN 60* 48* 41*  --  43* 46* 51* 55*  CREATININE 1.58* 1.41* 1.41*  --  1.71* 1.78* 1.83* 1.89*  CALCIUM 7.4* 7.1* 6.6*  --  6.9* 7.1* 7.3* 7.5*  MG 2.4  --   --  2.4  --  2.4 2.3  --   PHOS 3.5 3.2 2.9  --  3.4 3.8 3.7  --    Liver Function Tests:  Recent Labs Lab 07/16/13 0430 07/16/13 1630 07/17/13 0445 07/17/13 0500 07/17/13 1600 07/18/13 0500  AST 1016*  --   --  380*  --   --   ALT 656*  --   --  509*  --   --   ALKPHOS 95  --   --  96  --   --   BILITOT 0.6  --   --  0.5  --   --   PROT 5.6*  --   --  5.6*  --   --   ALBUMIN 2.3* 2.4* 2.3* 2.3* 2.4* 2.4*   No results found for this basename: LIPASE, AMYLASE,  in the last 168 hours No results found for this basename: AMMONIA,  in the last 168 hours CBC:  Recent Labs Lab 07/14/13 2340  07/16/13 0430 07/17/13 0500 07/18/13 0500 07/19/13 0445 07/20/13 0450  WBC 14.7*  < > 13.1* 13.1* 12.2* 10.3 9.1  NEUTROABS 11.1*  --   --   --   --   --   --   HGB 5.7*  < > 7.6* 7.6* 7.6* 7.6* 7.6*  HCT 18.2*  < > 22.7* 23.1* 23.7* 24.3* 24.2*  MCV 82.4  < > 81.4 84.0 85.9 85.9 86.1  PLT 363  < > 190 197 185 171 173  < > = values in this  interval not displayed. Cardiac Enzymes:  Recent Labs Lab 07/14/13 2340 07/15/13 0430 07/15/13 1120 07/16/13 0900  CKTOTAL 288*  --   --   --   CKMB 10.0*  --   --   --   TROPONINI 0.58* 3.82* >20.00* 14.50*  BNP (last 3 results)  Recent Labs  07/11/13 1630  PROBNP 23575.0*   CBG:  Recent Labs Lab 07/19/13 0800 07/19/13 1202 07/19/13 1658 07/19/13 2055 07/20/13 0756  GLUCAP 129* 176* 192* 194* 170*    Recent Results (from the past 240 hour(s))  URINE CULTURE     Status: None   Collection Time    07/11/13 12:46 PM      Result Value Range Status   Specimen Description URINE, CATHETERIZED   Final   Special Requests NONE   Final   Culture  Setup Time     Final   Value: 07/11/2013 13:36     Performed at [facility] Count     Final   Value: NO GROWTH     Performed at [facility]   Culture     Final   Value: NO GROWTH     Performed at [facility]   Report Status 07/12/2013 FINAL   Final  MRSA PCR SCREENING     Status: None   Collection Time    07/14/13  9:53 PM      Result Value Range Status   MRSA by PCR NEGATIVE  NEGATIVE Final   Comment:            The GeneXpert MRSA Assay (FDA     approved for NASAL specimens     only), is one component of a     comprehensive MRSA colonization     surveillance program. It is not     intended to diagnose MRSA     infection nor to guide or     monitor treatment for     MRSA infections.     Studies:  Recent x-ray studies have been reviewed in detail by the Attending Physician  Scheduled Meds:  Scheduled Meds: . antiseptic oral rinse  15 mL Mouth Rinse BID  . carvedilol  6.25 mg Oral BID WC  .  ceFAZolin (ANCEF) IV  2 g Intravenous Q8H  . famotidine  20 mg Oral BID  . furosemide  160 mg Intravenous Q6H  . hydrALAZINE  10 mg Intravenous Q6H  . insulin aspart  0-15 Units Subcutaneous TID WC  . insulin glargine  15 Units Subcutaneous QHS  . sodium chloride  3 mL Intravenous  Q12H    Time spent on care of this patient: 35 mins   [facility] T  [facility] Office  [phone] Pager - Text Page per Loretha Stapler as per below:  On-Call/Text Page:      [email or web address]      password [email or web address]  If 7PM-7AM, please contact night-coverage [email or web address] Password [email or web address] 07/20/2013, 9:04 AM   LOS: 6 days

## 2013-07-20 NOTE — Progress Notes (Signed)
Subjective:  Patient had uneventful night. No chest pain. Breathing is much better. Hgb stable 7.6. No evidence of any active bleeding at this time. Complains of back pain and difficulty sleeping.  Objective:  Vital Signs in the last 24 hours: Temp:  [97.7 F (36.5 C)-98.2 F (36.8 C)] 97.7 F (36.5 C) (10/04 0756) Pulse Rate:  [109-117] 109 (10/03 2000) Resp:  [11-21] 13 (10/03 2000) BP: (97-133)/(58-86) 118/81 mmHg (10/04 0756) SpO2:  [94 %-97 %] 94 % (10/04 0756) Weight:  [305 lb 12.5 oz (138.7 kg)] 305 lb 12.5 oz (138.7 kg) (10/04 0500)  Intake/Output from previous day: 10/03 0701 - 10/04 0700 In: 809 [P.O.:120; I.V.:175; IV Piggyback:514] Out: 3495 [Urine:3495]  Intake/Output from this shift: Total I/O In: -  Out: 325 [Urine:325]  . antiseptic oral rinse  15 mL Mouth Rinse BID  . carvedilol  6.25 mg Oral BID WC  .  ceFAZolin (ANCEF) IV  2 g Intravenous Q8H  . famotidine  20 mg Oral BID  . furosemide  160 mg Intravenous Q6H  . hydrALAZINE  10 mg Intravenous Q6H  . insulin aspart  0-15 Units Subcutaneous TID WC  . insulin glargine  15 Units Subcutaneous QHS  . sodium chloride  3 mL Intravenous Q12H   . sodium chloride 10 mL/hr at 07/18/13 0800    Physical Exam: General: Well developed WM in no acute distress, appearing chronically ill.  Not tachypneic  Head: Normal  Neck: JVD moderately elevated.  Lungs: Bibasilar rales Heart: RRR S1 S2 without murmurs, rubs, or gallops. Tachycardia. Abdomen: Soft, non-tender, rounded/distended with normoactive bowel sounds. No hepatomegaly. No rebound/guarding. No obvious abdominal masses.  Msk: right BKA, left foot bandaged. Extremities: No clubbing or cyanosis. Moderate Left lower leg edema. Neuro: Alert and oriented X 3. Moves all extremities spontaneously.  Psych: Responds to questions appropriately with a normal affect.    Recent Labs  07/19/13 0445 07/20/13 0450  WBC 10.3 9.1  HGB 7.6* 7.6*  PLT 171 173      Recent Labs  07/19/13 0445 07/20/13 0450  NA 131* 134*  K 4.8 4.7  CL 97 98  CO2 25 24  GLUCOSE 151* 187*  BUN 51* 55*  CREATININE 1.83* 1.89*   No results found for this basename: TROPONINI, CK, MB,  in the last 72 hours Hepatic Function Panel  Recent Labs  07/18/13 0500  ALBUMIN 2.4*   No results found for this basename: CHOL,  in the last 72 hours No results found for this basename: PROTIME,  in the last 72 hours  Imaging: No results found.  Cardiac Studies: 2-D echo on 07/15/13: Study Conclusions  - Left ventricle: The cavity size was normal. Wall thickness was increased in a pattern of moderate LVH. Systolic function was mildly to moderately reduced. The estimated ejection fraction was in the range of 40% to 45%. Doppler parameters are consistent with restrictive physiology, indicative of decreased left ventricular diastolic compliance and/or increased left atrial pressure. - Left atrium: The atrium was mildly dilated. - Pulmonary arteries: Systolic pressure was mildly increased. - Pericardium, extracardiac: A small pericardial effusion was identified. Impressions:  - Extremely limited due to poor sound wave transmission; there appears to be hypokinesis of the inferoposterior wall with mild to moderate reduction in LV function; suggest TEE or cardiac MRI if clinically indicated.  Assessment/Plan:    LOS: 6 days   1. Recent sepsis/MSSA bacteremia (adm 9/9->9/17->CIR) from gangrenous R foot s/p R BKA 06/29/13, TEE  negative for bacteremia 07/03/13   2. 3-vessel CAD by cath 06/28/13 (inital plan was for CABG once recovered from BKA), now with 2nd NSTEMI (peak troponin 15 on 06/26/13 that had normalized. Second NSTEMI with peak troponin greater than 20, now downtrending, yesterday 14.5. NSTEMI secondary to demand ischemia.  Carvedilol increased  to 6.25 mg BID yesterday (stable BP, sinus tachycardia). Will slowly titrate as tolerated.   3. Ischemic  cardiomyopathy with acute systolic CHF (EF 16-10% by echo on 07/15/13. Moderate mitral regurgitation by TEE 07/03/13 . Excellent diuresis with I/O negative 3 liters. Continue IV lasix.  4. Acute hypoxic respiratory failure secondary to anemia, pulmonary edema, and bilateral pleural effusions   5. Hypotension due to circulatory shock, now stable off pressors.   6. Acute symptomatic anemia.  Hemoglobin stable overnight  7. Acute renal failure with oliguria, now on CVVHD  8. Diabetes mellitus, uncontrolled    Judi Cong 07/20/2013, 9:03 AM

## 2013-07-21 ENCOUNTER — Inpatient Hospital Stay (HOSPITAL_COMMUNITY): Payer: 59

## 2013-07-21 DIAGNOSIS — I2589 Other forms of chronic ischemic heart disease: Secondary | ICD-10-CM

## 2013-07-21 DIAGNOSIS — I2510 Atherosclerotic heart disease of native coronary artery without angina pectoris: Secondary | ICD-10-CM

## 2013-07-21 LAB — COMPREHENSIVE METABOLIC PANEL
Albumin: 2.6 g/dL — ABNORMAL LOW (ref 3.5–5.2)
Alkaline Phosphatase: 83 U/L (ref 39–117)
BUN: 60 mg/dL — ABNORMAL HIGH (ref 6–23)
Chloride: 98 mEq/L (ref 96–112)
Creatinine, Ser: 1.84 mg/dL — ABNORMAL HIGH (ref 0.50–1.35)
Sodium: 134 mEq/L — ABNORMAL LOW (ref 135–145)
Total Bilirubin: 0.5 mg/dL (ref 0.3–1.2)

## 2013-07-21 LAB — CBC
MCHC: 30.9 g/dL (ref 30.0–36.0)
MCV: 85.6 fL (ref 78.0–100.0)
Platelets: 179 10*3/uL (ref 150–400)
RBC: 2.91 MIL/uL — ABNORMAL LOW (ref 4.22–5.81)
RDW: 17.6 % — ABNORMAL HIGH (ref 11.5–15.5)
WBC: 9.7 10*3/uL (ref 4.0–10.5)

## 2013-07-21 LAB — GLUCOSE, CAPILLARY
Glucose-Capillary: 208 mg/dL — ABNORMAL HIGH (ref 70–99)
Glucose-Capillary: 227 mg/dL — ABNORMAL HIGH (ref 70–99)
Glucose-Capillary: 234 mg/dL — ABNORMAL HIGH (ref 70–99)

## 2013-07-21 MED ORDER — CARVEDILOL 12.5 MG PO TABS
12.5000 mg | ORAL_TABLET | Freq: Two times a day (BID) | ORAL | Status: DC
  Administered 2013-07-21 – 2013-07-23 (×5): 12.5 mg via ORAL
  Filled 2013-07-21 (×8): qty 1

## 2013-07-21 MED ORDER — INSULIN DETEMIR 100 UNIT/ML ~~LOC~~ SOLN
30.0000 [IU] | Freq: Every day | SUBCUTANEOUS | Status: DC
  Administered 2013-07-21 – 2013-07-22 (×2): 30 [IU] via SUBCUTANEOUS
  Filled 2013-07-21 (×3): qty 0.3

## 2013-07-21 MED ORDER — POLYETHYLENE GLYCOL 3350 17 G PO PACK
17.0000 g | PACK | Freq: Every day | ORAL | Status: DC
  Administered 2013-07-22 – 2013-07-25 (×4): 17 g via ORAL
  Filled 2013-07-21 (×6): qty 1

## 2013-07-21 MED ORDER — SENNOSIDES-DOCUSATE SODIUM 8.6-50 MG PO TABS
1.0000 | ORAL_TABLET | Freq: Two times a day (BID) | ORAL | Status: DC
  Administered 2013-07-21 – 2013-07-26 (×11): 1 via ORAL
  Filled 2013-07-21 (×12): qty 1

## 2013-07-21 NOTE — Progress Notes (Signed)
[facility] Progress Note [place] TEAM 1 - Stepdown/ICU TEAM   Albert [name] [id number]:[id number] DOB: August 05, 1961 DOA: 07/14/2013 PCP: Rollene Rotunda, MD  Admit HPI / Brief Narrative: 52 yr old WM w/ a pmhx significant for HTN, CVA, ISCM, originally admitted 06/25/2013 with SIRS and MSSA bacteremia from gangrenous right foot.  He underwent a R BKA on 9/13. During that admission he became hypotensive and suffered a NSTEMI. He underwent LHC on 06/28/2013 with findings of 3v CAD with an EF of 40%. There were plans to perform CABG once he stabilized. He was subsequently transferred to the rehab floor 9/17.  While there he developed increasing SOB with increased weight (10 lbs over approx one week). He received IV lasix with only temporary improvement in his SOB. He was subsequently started on Cefepime and Vancomcyn for a concern of HCAP. (It was noted that the patient was on Ancef for MSSA bacteremia per ID (9/13 start date with plans to continue until 10/10) but this was discontinued when coverage was broadened for HCAP.)  On 07/14/2013 RRT was called to the rehab unit due to patient complaining of feeling unwell. He was found to have a BP of 85/57. He was noted to have decreased UOP with only 100 cc over about 7 hours. He was given a NS 250 cc bolus and transferred to the SDU.  BMET revealed a K of 5.9. EKG was performed with no evidence of peaked T waves. He was noted to have a Cr of 1.77, increased from 1.25 on 9/27. Later in that day, he developed hypoxic resp failure, with sats dropping into the mid 80% range despite NRB.  He was subsequently transferred to the ICU on the PCCM service.    Assessment/Plan:  Recent sepsis/MSSA bacteremia from gangrenous R foot s/p R BKA 06/29/13 admit 9/9->9/17->CIR - TEE negative for bacteremia 07/03/13 - to complete abx course 10/10  3-vessel CAD by cath 06/28/13 now with 2nd NSTEMI inital plan was for CABG once recovered from BKA - Cardiology following   Ischemic  cardiomyopathy with acute systolic CHF EF 40% by TTE 9/10 - 45-50% by TEE 9/17 - Cardiology following - remains markedly volume overloaded - high dose Lasix continues - net negative approximately 7 L at this time  Acute kidney injury Nephrology has signed off - required transient CVVHD - previously normal renal function - cont high dose lasix - UOP consistent for now  Acute blood loss anemia - bleeding from surgical site s/p fall  baseline appears to be around 12 - s/p transfusion - stable for now - RN did report blood loss at the time of dressing change right stump early this morning from area of dehissed wound - cont to avoid anticoag for now   Acute hypoxic respiratory failure secondary to anemia and volume overload Resolved - sats in mid 90s on RA  B Pleural effusions  F/u CXR suggests stability - clinically stabilizing   RUE edema  Persists - no DVT on doppler - elevate - follow   Sinus tachycardia  Persists - Cardiology adjusting beta blocker  Hypotension Resolved  Transaminitis shock liver - resolved  Hyperkalemia Resolved  Diabetes mellitus CBG still not at goal - adjust tx again and follow  Moderate mitral regurgitation by TEE 07/03/13  Code Status: FULL Family Communication: no family present at time of exam Disposition Plan: SDU  Consultants: Cardiology - [facility] Pace PCCM >> [facility] Nephrology  Procedures: CVVHD 9/29 >> 10/1 10/3 - RUE doppler -  no DVT  Antibiotics: Ancef 06/29/2013 >> 07/11/2013 + 10/03 >> 07/26/2013 (planned) Cefepime 9/26>>> 10/03 Vanc 9/25 >>> 10/02  DVT prophylaxis: SCDs only due to recent bleeding   HPI/Subjective: The patient is alert and oriented.  He has no complaints at the present time.  He denies chest pain shortness of breath fevers chills nausea or vomiting.  RN did note some ongoing bleeding of right stump during dressing change.  Objective: Blood pressure 139/97, pulse 116, temperature 98.2 F (36.8 C),  temperature source Oral, resp. rate 18, height 5\' 10"  (1.778 m), weight 136.2 kg (300 lb 4.3 oz), SpO2 96.00%.  Intake/Output Summary (Last 24 hours) at 07/21/13 0901 Last data filed at 07/21/13 0800  Gross per 24 hour  Intake    689 ml  Output   3605 ml  Net  -2916 ml   Exam: General: No acute respiratory distress Vascular: Right neck hemodialysis catheter with pigtail, left upper extremity PICC line Lungs: Fine crackles scattered throughout with no wheeze - poor air movement in bilateral bases Cardiovascular: Tachycardic but regular without gallop or rub appreciable Abdomen: Nontender, nondistended, soft, bowel sounds positive, no rebound, no appreciable mass Extremities: Bilateral lower extremity wounds dressed and dry, diffuse edema at 2+ with right upper extremity 3+ - scrotal edema without erythema or appreciable breakdown  Data Reviewed: Basic Metabolic Panel:  Recent Labs Lab 07/16/13 0430 07/16/13 1630 07/17/13 0445 07/17/13 0500 07/17/13 1600 07/18/13 0500 07/19/13 0445 07/20/13 0450 07/21/13 0500  NA 132* 132* 132*  --  129* 131* 131* 134* 134*  K 5.5* 5.3* 5.3*  --  5.7* 5.1 4.8 4.7 4.7  CL 100 99 100  --  98 98 97 98 98  CO2 24 23 24   --  23 25 25 24 26   GLUCOSE 166* 191* 162*  --  296* 189* 151* 187* 224*  BUN 60* 48* 41*  --  43* 46* 51* 55* 60*  CREATININE 1.58* 1.41* 1.41*  --  1.71* 1.78* 1.83* 1.89* 1.84*  CALCIUM 7.4* 7.1* 6.6*  --  6.9* 7.1* 7.3* 7.5* 7.9*  MG 2.4  --   --  2.4  --  2.4 2.3  --   --   PHOS 3.5 3.2 2.9  --  3.4 3.8 3.7  --   --    Liver Function Tests:  Recent Labs Lab 07/16/13 0430  07/17/13 0445 07/17/13 0500 07/17/13 1600 07/18/13 0500 07/21/13 0500  AST 1016*  --   --  380*  --   --  32  ALT 656*  --   --  509*  --   --  87*  ALKPHOS 95  --   --  96  --   --  83  BILITOT 0.6  --   --  0.5  --   --  0.5  PROT 5.6*  --   --  5.6*  --   --  6.1  ALBUMIN 2.3*  < > 2.3* 2.3* 2.4* 2.4* 2.6*  < > = values in this interval not  displayed.  CBC:  Recent Labs Lab 07/14/13 2340  07/17/13 0500 07/18/13 0500 07/19/13 0445 07/20/13 0450 07/21/13 0500  WBC 14.7*  < > 13.1* 12.2* 10.3 9.1 9.7  NEUTROABS 11.1*  --   --   --   --   --   --   HGB 5.7*  < > 7.6* 7.6* 7.6* 7.6* 7.7*  HCT 18.2*  < > 23.1* 23.7* 24.3* 24.2* 24.9*  MCV 82.4  < >  84.0 85.9 85.9 86.1 85.6  PLT 363  < > 197 185 171 173 179  < > = values in this interval not displayed. Cardiac Enzymes:  Recent Labs Lab 07/14/13 2340 07/15/13 0430 07/15/13 1120 07/16/13 0900  CKTOTAL 288*  --   --   --   CKMB 10.0*  --   --   --   TROPONINI 0.58* 3.82* >20.00* 14.50*   BNP (last 3 results)  Recent Labs  07/11/13 1630  PROBNP 23575.0*   CBG:  Recent Labs Lab 07/20/13 0756 07/20/13 1139 07/20/13 1706 07/20/13 2123 07/21/13 0751  GLUCAP 170* 189* 209* 210* 208*    Recent Results (from the past 240 hour(s))  URINE CULTURE     Status: None   Collection Time    07/11/13 12:46 PM      Result Value Range Status   Specimen Description URINE, CATHETERIZED   Final   Special Requests NONE   Final   Culture  Setup Time     Final   Value: 07/11/2013 13:36     Performed at [facility] Count     Final   Value: NO GROWTH     Performed at [facility]   Culture     Final   Value: NO GROWTH     Performed at [facility]   Report Status 07/12/2013 FINAL   Final  MRSA PCR SCREENING     Status: None   Collection Time    07/14/13  9:53 PM      Result Value Range Status   MRSA by PCR NEGATIVE  NEGATIVE Final   Comment:            The GeneXpert MRSA Assay (FDA     approved for NASAL specimens     only), is one component of a     comprehensive MRSA colonization     surveillance program. It is not     intended to diagnose MRSA     infection nor to guide or     monitor treatment for     MRSA infections.     Studies:  Recent x-ray studies have been reviewed in detail by the Attending  Physician  Scheduled Meds:  Scheduled Meds: . antiseptic oral rinse  15 mL Mouth Rinse BID  . carvedilol  12.5 mg Oral BID WC  .  ceFAZolin (ANCEF) IV  2 g Intravenous Q8H  . famotidine  20 mg Oral BID  . furosemide  160 mg Intravenous Q6H  . hydrALAZINE  10 mg Oral Q6H  . insulin aspart  0-15 Units Subcutaneous TID WC  . insulin glargine  22 Units Subcutaneous QHS  . sodium chloride  3 mL Intravenous Q12H    Time spent on care of this patient: 35 mins   [facility] T  [facility] Office  [phone] Pager - Text Page per Loretha Stapler as per below:  On-Call/Text Page:      [email or web address]      password [email or web address]  If 7PM-7AM, please contact night-coverage [email or web address] Password [email or web address] 07/21/2013, 9:01 AM   LOS: 7 days

## 2013-07-21 NOTE — Progress Notes (Signed)
Subjective:  No chest pain. Breathing is better. Hgb stable 7.7. No evidence of any active bleeding at this time.  Objective:  Vital Signs in the last 24 hours: Temp:  [96.5 F (35.8 C)-98.5 F (36.9 C)] 98.2 F (36.8 C) (10/05 0733) Pulse Rate:  [106-116] 116 (10/05 0733) Resp:  [18] 18 (10/05 0414) BP: (101-139)/(72-97) 139/97 mmHg (10/05 0733) SpO2:  [94 %-96 %] 96 % (10/05 0733) Weight:  [300 lb 4.3 oz (136.2 kg)] 300 lb 4.3 oz (136.2 kg) (10/05 0500)  Intake/Output from previous day: 10/04 0701 - 10/05 0700 In: 666 [I.V.:252; IV Piggyback:414] Out: 3755 [Urine:3755]  Intake/Output from this shift: Total I/O In: -  Out: 175 [Urine:175]  . antiseptic oral rinse  15 mL Mouth Rinse BID  . carvedilol  6.25 mg Oral BID WC  .  ceFAZolin (ANCEF) IV  2 g Intravenous Q8H  . famotidine  20 mg Oral BID  . furosemide  160 mg Intravenous Q6H  . hydrALAZINE  10 mg Oral Q6H  . insulin aspart  0-15 Units Subcutaneous TID WC  . insulin glargine  22 Units Subcutaneous QHS  . sodium chloride  3 mL Intravenous Q12H   . sodium chloride 10 mL/hr at 07/18/13 0800    Physical Exam: General: Well developed WM in no acute distress, appearing chronically ill.  Not tachypneic  Head: Normal  Neck: JVD moderately elevated.  Lungs: Bibasilar rales Heart: RRR S1 S2 without murmurs, rubs, or gallops. Tachycardia. Abdomen: Soft, non-tender, rounded/distended with normoactive bowel sounds. No hepatomegaly. No rebound/guarding. No obvious abdominal masses.  Msk: right BKA, left foot bandaged. Extremities: No clubbing or cyanosis. Moderate diffuse edema/anasarca. Neuro: Alert and oriented X 3. Moves all extremities spontaneously.  Psych: Responds to questions appropriately with a normal affect.    Recent Labs  07/20/13 0450 07/21/13 0500  WBC 9.1 9.7  HGB 7.6* 7.7*  PLT 173 179    Recent Labs  07/20/13 0450 07/21/13 0500  NA 134* 134*  K 4.7 4.7  CL 98 98  CO2 24 26    GLUCOSE 187* 224*  BUN 55* 60*  CREATININE 1.89* 1.84*   No results found for this basename: TROPONINI, CK, MB,  in the last 72 hours Hepatic Function Panel  Recent Labs  07/21/13 0500  PROT 6.1  ALBUMIN 2.6*  AST 32  ALT 87*  ALKPHOS 83  BILITOT 0.5   No results found for this basename: CHOL,  in the last 72 hours No results found for this basename: PROTIME,  in the last 72 hours  Imaging: Dg Chest Port 1 View  07/21/2013   CLINICAL DATA:  Edema and short of breath  EXAM: PORTABLE CHEST - 1 VIEW  COMPARISON:  Chest radiograph 07/17/2013  FINDINGS: Right central venous line is unchanged. Left PICC line is unchanged. Stable enlarged cardiac silhouette. There is bibasilar atelectasis similar to prior. There is some improvement in perihilar airspace disease seen on prior. No pneumothorax.  IMPRESSION: 1. Some mild improvement perihilar airspace disease.  2.  Persistent dense bibasilar atelectasis.   Electronically Signed   By: Genevive Bi M.D.   On: 07/21/2013 07:40    Cardiac Studies: 2-D echo on 07/15/13: Study Conclusions  - Left ventricle: The cavity size was normal. Wall thickness was increased in a pattern of moderate LVH. Systolic function was mildly to moderately reduced. The estimated ejection fraction was in the range of 40% to 45%. Doppler parameters are consistent with restrictive physiology,  indicative of decreased left ventricular diastolic compliance and/or increased left atrial pressure. - Left atrium: The atrium was mildly dilated. - Pulmonary arteries: Systolic pressure was mildly increased. - Pericardium, extracardiac: A small pericardial effusion was identified. Impressions:  - Extremely limited due to poor sound wave transmission; there appears to be hypokinesis of the inferoposterior wall with mild to moderate reduction in LV function; suggest TEE or cardiac MRI if clinically indicated.  Assessment/Plan:    LOS: 6 days   1. Recent  sepsis/MSSA bacteremia (adm 9/9->9/17->CIR) from gangrenous R foot s/p R BKA 06/29/13, TEE negative for vegetation 07/03/13   2. 3-vessel CAD by cath 06/28/13 (inital plan was for CABG once recovered from BKA), now with 2nd NSTEMI  secondary to demand ischemia.  Will titrate carvedilol to 12.5 mg bid.  3. Ischemic cardiomyopathy with acute systolic CHF (EF 16-10% by echo on 07/15/13. Moderate mitral regurgitation by TEE 07/03/13 . Diuresis with I/O negative 3 liters. Still very volume overloaded. Continue IV lasix.  4. Acute hypoxic respiratory failure secondary to anemia, pulmonary edema, and bilateral pleural effusions   5. Hypotension due to circulatory shock, now stable off pressors.   6. Acute symptomatic anemia.  Hemoglobin stable overnight  7. Acute renal failure  8. Diabetes mellitus, uncontrolled    Judi Cong 07/21/2013, 8:35 AM

## 2013-07-22 LAB — RENAL FUNCTION PANEL
Albumin: 2.5 g/dL — ABNORMAL LOW (ref 3.5–5.2)
CO2: 27 mEq/L (ref 19–32)
GFR calc Af Amer: 52 mL/min — ABNORMAL LOW (ref >90)
GFR calc non Af Amer: 45 mL/min — ABNORMAL LOW (ref >90)
Phosphorus: 4.7 mg/dL — ABNORMAL HIGH (ref 2.3–4.6)
Potassium: 4.4 mEq/L (ref 3.5–5.1)
Sodium: 138 mEq/L (ref 135–145)

## 2013-07-22 LAB — CBC
Hemoglobin: 7.8 g/dL — ABNORMAL LOW (ref 13.0–17.0)
MCHC: 31.7 g/dL (ref 30.0–36.0)
MCV: 85.1 fL (ref 78.0–100.0)
Platelets: 158 10*3/uL (ref 150–400)
RDW: 17 % — ABNORMAL HIGH (ref 11.5–15.5)

## 2013-07-22 LAB — GLUCOSE, CAPILLARY: Glucose-Capillary: 132 mg/dL — ABNORMAL HIGH (ref 70–99)

## 2013-07-22 MED ORDER — HYDRALAZINE HCL 25 MG PO TABS
25.0000 mg | ORAL_TABLET | Freq: Four times a day (QID) | ORAL | Status: DC
  Administered 2013-07-22 – 2013-07-25 (×11): 25 mg via ORAL
  Filled 2013-07-22 (×16): qty 1

## 2013-07-22 NOTE — Progress Notes (Signed)
[facility] Progress Note  TEAM 1 - Stepdown/ICU TEAM   Albert [name]:[id number] DOB: 1961/07/29 DOA: 07/14/2013 PCP: Rollene Rotunda, MD  Admit HPI / Brief Narrative: 52 yr old WM w/ a pmhx significant for HTN, CVA, ISCM, originally admitted 06/25/2013 with SIRS and MSSA bacteremia from gangrenous right foot.  He underwent a R BKA on 9/13. During that admission he became hypotensive and suffered a NSTEMI. He underwent LHC on 06/28/2013 with findings of 3v CAD with an EF of 40%. There were plans to perform CABG once he stabilized. He was subsequently transferred to the rehab floor 9/17.  While there he developed increasing SOB with increased weight (10 lbs over approx one week). He received IV lasix with only temporary improvement in his SOB. He was subsequently started on Cefepime and Vancomcyn for a concern of HCAP. (It was noted that the patient was on Ancef for MSSA bacteremia per ID (9/13 start date with plans to continue until 10/10) but this was discontinued when coverage was broadened for HCAP.)  On 07/14/2013 RRT was called to the rehab unit due to patient complaining of feeling unwell. He was found to have a BP of 85/57. He was noted to have decreased UOP with only 100 cc over about 7 hours. He was given a NS 250 cc bolus and transferred to the SDU.  BMET revealed a K of 5.9. EKG was performed with no evidence of peaked T waves. He was noted to have a Cr of 1.77, increased from 1.25 on 9/27. Later in that day, he developed hypoxic resp failure, with sats dropping into the mid 80% range despite NRB.  He was subsequently transferred to the ICU on the PCCM service.    Assessment/Plan:  Recent sepsis/MSSA bacteremia from gangrenous R foot s/p R BKA 06/29/13 admit 9/9->9/17->CIR - TEE negative for bacteremia 07/03/13 - to complete abx course 10/10  3-vessel CAD by cath 06/28/13 now with 2nd NSTEMI inital plan was for CABG once recovered from BKA - Cardiology following   Ischemic  cardiomyopathy with acute systolic CHF EF 40% by TTE 9/10 - 45-50% by TEE 9/17 - Cardiology following - remains volume overloaded - high dose Lasix continues - net negative approximately 10 L at this time - Cards has increased Hydralazine to 25 mg q 6 hrs (10/6)  Acute kidney injury Nephrology has signed off - required transient CVVHD - previously normal renal function - cont high dose lasix - UOP consistent for now - crt slowly improving   Acute blood loss anemia - bleeding from surgical site s/p fall  baseline appears to be around 12 - s/p transfusion - stable for now - no evidence of further blood loss from stump today - cont to avoid anticoag for now   Acute hypoxic respiratory failure secondary to anemia and volume overload Resolved - sats in mid 90s on RA  B Pleural effusions  F/u CXR suggests stability - clinically stabilizing   RUE edema  Persists - no DVT on doppler - elevate - follow   Scrotal edema Cont Foley - elvevate scrotum on towel   Sinus tachycardia  Persists - Cardiology managing beta blocker  Hypotension Resolved  Transaminitis shock liver - resolved  Hyperkalemia Resolved  Diabetes mellitus CBG improved - follow w/o change today   Moderate mitral regurgitation by TEE 07/03/13  Code Status: FULL Family Communication: no family present at time of exam Disposition Plan: SDU  Consultants: Cardiology - [facility]ace PCCM >> [facility]  Nephrology  Procedures: CVVHD 9/29 >> 10/1 10/3 - RUE doppler - no DVT  Antibiotics: Ancef 06/29/2013 >> 07/11/2013 + 10/03 >> 07/26/2013 (planned) Cefepime 9/26>>> 10/03 Vanc 9/25 >>> 10/02  DVT prophylaxis: SCDs only due to recent bleeding   HPI/Subjective: Alert. No CP/SOB. Asking questions about edema. Generalized malaise.  Objective: Blood pressure 138/102, pulse 103, temperature 98.1 F (36.7 C), temperature source Oral, resp. rate 18, height 5\' 10"  (1.778 m), weight 135.4 kg (298 lb 8.1 oz), SpO2  94.00%.  Intake/Output Summary (Last 24 hours) at 07/22/13 1320 Last data filed at 07/22/13 0900  Gross per 24 hour  Intake    736 ml  Output   4150 ml  Net  -3414 ml   Exam: General: No acute respiratory distress Vascular: Right neck hemodialysis catheter with pigtail, left upper extremity PICC line Lungs: Fine crackles scattered throughout with no wheeze - poor air movement in bilateral bases Cardiovascular: Tachycardic but regular without gallop or rub appreciable Abdomen: Nontender, nondistended, soft, bowel sounds positive, no rebound, no appreciable mass Extremities: Bilateral lower extremity wounds dressed and dry, diffuse edema at 2+ with right upper extremity 3+ - scrotal edema without erythema or appreciable breakdown  Data Reviewed: Basic Metabolic Panel:  Recent Labs Lab 07/16/13 0430  07/17/13 0445 07/17/13 0500 07/17/13 1600 07/18/13 0500 07/19/13 0445 07/20/13 0450 07/21/13 0500 07/22/13 0500  NA 132*  < > 132*  --  129* 131* 131* 134* 134* 138  K 5.5*  < > 5.3*  --  5.7* 5.1 4.8 4.7 4.7 4.4  CL 100  < > 100  --  98 98 97 98 98 101  CO2 24  < > 24  --  23 25 25 24 26 27   GLUCOSE 166*  < > 162*  --  296* 189* 151* 187* 224* 157*  BUN 60*  < > 41*  --  43* 46* 51* 55* 60* 63*  CREATININE 1.58*  < > 1.41*  --  1.71* 1.78* 1.83* 1.89* 1.84* 1.71*  CALCIUM 7.4*  < > 6.6*  --  6.9* 7.1* 7.3* 7.5* 7.9* 7.8*  MG 2.4  --   --  2.4  --  2.4 2.3  --   --   --   PHOS 3.5  < > 2.9  --  3.4 3.8 3.7  --   --  4.7*  < > = values in this interval not displayed. Liver Function Tests:  Recent Labs Lab 07/16/13 0430  07/17/13 0500 07/17/13 1600 07/18/13 0500 07/21/13 0500 07/22/13 0500  AST 1016*  --  380*  --   --  32  --   ALT 656*  --  509*  --   --  87*  --   ALKPHOS 95  --  96  --   --  83  --   BILITOT 0.6  --  0.5  --   --  0.5  --   PROT 5.6*  --  5.6*  --   --  6.1  --   ALBUMIN 2.3*  < > 2.3* 2.4* 2.4* 2.6* 2.5*  < > = values in this interval not  displayed.  CBC:  Recent Labs Lab 07/18/13 0500 07/19/13 0445 07/20/13 0450 07/21/13 0500 07/22/13 0500  WBC 12.2* 10.3 9.1 9.7 7.2  HGB 7.6* 7.6* 7.6* 7.7* 7.8*  HCT 23.7* 24.3* 24.2* 24.9* 24.6*  MCV 85.9 85.9 86.1 85.6 85.1  PLT 185 171 173 179 158   Cardiac Enzymes:  Recent  Labs Lab 07/16/13 0900  TROPONINI 14.50*   BNP (last 3 results)  Recent Labs  07/11/13 1630  PROBNP 23575.0*   CBG:  Recent Labs Lab 07/21/13 1158 07/21/13 1710 07/21/13 2132 07/22/13 0830 07/22/13 1210  GLUCAP 227* 234* 214* 129* 136*    Recent Results (from the past 240 hour(s))  MRSA PCR SCREENING     Status: None   Collection Time    07/14/13  9:53 PM      Result Value Range Status   MRSA by PCR NEGATIVE  NEGATIVE Final   Comment:            The GeneXpert MRSA Assay (FDA     approved for NASAL specimens     only), is one component of a     comprehensive MRSA colonization     surveillance program. It is not     intended to diagnose MRSA     infection nor to guide or     monitor treatment for     MRSA infections.     Studies:  Recent x-ray studies have been reviewed in detail by the Attending Physician  Scheduled Meds:  Scheduled Meds: . antiseptic oral rinse  15 mL Mouth Rinse BID  . carvedilol  12.5 mg Oral BID WC  .  ceFAZolin (ANCEF) IV  2 g Intravenous Q8H  . famotidine  20 mg Oral BID  . furosemide  160 mg Intravenous Q6H  . hydrALAZINE  25 mg Oral Q6H  . insulin aspart  0-15 Units Subcutaneous TID WC  . insulin detemir  30 Units Subcutaneous QHS  . polyethylene glycol  17 g Oral Daily  . senna-docusate  1 tablet Oral BID  . sodium chloride  3 mL Intravenous Q12H    Time spent on care of this patient: 35 mins   Albert [name],Albert L. ANP  [facility] Office  [phone] Pager - Text Page per Loretha Stapler as per below:  On-Call/Text Page:      [email or web address]      password [email or web address]  If 7PM-7AM, please contact night-coverage [email or web address] Password  [email or web address] 07/22/2013, 1:20 PM   LOS: 8 days   I have personally examined this patient and reviewed the entire database. I have reviewed the above note, made any necessary editorial changes, and agree with its content.  Lonia Blood, MD [facility]

## 2013-07-22 NOTE — Progress Notes (Signed)
Subjective:  No new complaints today. Eating breakfast okay. No chest pain or increased dyspnea. Mild weight loss over past 3 days but remains grossly fluid overloaded. Chest xray shows persistent severe CHF but slightly improved from previous. BP remains in elevated range.  Objective:  Vital Signs in the last 24 hours: Temp:  [97.5 F (36.4 C)-98.7 F (37.1 C)] 97.5 F (36.4 C) (10/06 0827) Pulse Rate:  [108-113] 109 (10/06 0827) BP: (127-150)/(94-122) 150/122 mmHg (10/06 0827) SpO2:  [95 %-97 %] 96 % (10/06 0827) Weight:  [298 lb 8.1 oz (135.4 kg)] 298 lb 8.1 oz (135.4 kg) (10/06 0500)  Intake/Output from previous day: 10/05 0701 - 10/06 0700 In: 745 [P.O.:520; I.V.:43; IV Piggyback:182] Out: 3785 [Urine:3785] Intake/Output from this shift: Total I/O In: -  Out: 700 [Urine:700]  . antiseptic oral rinse  15 mL Mouth Rinse BID  . carvedilol  12.5 mg Oral BID WC  .  ceFAZolin (ANCEF) IV  2 g Intravenous Q8H  . famotidine  20 mg Oral BID  . furosemide  160 mg Intravenous Q6H  . hydrALAZINE  10 mg Oral Q6H  . insulin aspart  0-15 Units Subcutaneous TID WC  . insulin detemir  30 Units Subcutaneous QHS  . polyethylene glycol  17 g Oral Daily  . senna-docusate  1 tablet Oral BID  . sodium chloride  3 mL Intravenous Q12H   . sodium chloride 10 mL/hr at 07/21/13 0800    Physical Exam: The patient appears to be in no distress.  Chest no wheezing. Decreased breath sounds at bases.  Heart reveals S3 gallop.  The abdomen is nontender.  Extremities marked edema of right arm and both legs. Right stump to be redressed today.  Neurologic exam is normal strength and no lateralizing weakness.  No sensory deficits.  Integument reveals no rash  Lab Results:  Recent Labs  07/21/13 0500 07/22/13 0500  WBC 9.7 7.2  HGB 7.7* 7.8*  PLT 179 158    Recent Labs  07/21/13 0500 07/22/13 0500  NA 134* 138  K 4.7 4.4  CL 98 101  CO2 26 27  GLUCOSE 224* 157*  BUN 60*  63*  CREATININE 1.84* 1.71*   No results found for this basename: TROPONINI, CK, MB,  in the last 72 hours Hepatic Function Panel  Recent Labs  07/21/13 0500 07/22/13 0500  PROT 6.1  --   ALBUMIN 2.6* 2.5*  AST 32  --   ALT 87*  --   ALKPHOS 83  --   BILITOT 0.5  --    No results found for this basename: CHOL,  in the last 72 hours No results found for this basename: PROTIME,  in the last 72 hours  Imaging: Imaging results have been reviewed  Cardiac Studies: Telemetry shows sinus tachycardia. Assessment/Plan:  1. Recent sepsis/MSSA bacteremia (adm 9/9->9/17->CIR) from gangrenous R foot s/p R BKA 06/29/13, TEE negative for vegetation 07/03/13  2. 3-vessel CAD by cath 06/28/13 (inital plan was for CABG once recovered from BKA), now with 2nd NSTEMI secondary to demand ischemia. Continue carvedilol  12.5 mg bid.  3. Ischemic cardiomyopathy with acute systolic CHF (EF 21-30% by echo on 07/15/13. Moderate mitral regurgitation by TEE 07/03/13 .  Still very volume overloaded. Continue IV lasix. Increase afterload reduction by increasing hydralazine to 25 mg po q6h. 4. Acute hypoxic respiratory failure secondary to anemia, pulmonary edema, and bilateral pleural effusions  5. Hypotension due to circulatory shock, now stable off pressors.  6. Acute symptomatic anemia. Hemoglobin stable.  No sign of active bleeding. Prior history of oozing from stump right leg. Remains off platelet inhibitors. 7. Acute renal failure, improving slowly.  Plan: increase hydralazine today to 25 mg q6h. Continue lasix.   LOS: 8 days    Albert [name] 07/22/2013, 8:50 AM

## 2013-07-22 NOTE — Clinical Social Work Note (Signed)
Per RNCM, pt came from CIR and is likely going back there when medically ready--CSW will follow pt and will initiate SNF search if consulted.   Maryclare Labrador, MSW, [facility] Clinical Social Worker [phone]

## 2013-07-22 NOTE — Progress Notes (Signed)
Physical Therapy Treatment Patient Details Name: BERKELEY VANAKEN MRN: [id number] DOB: [birth year] Today's Date: 07/22/2013 Time: 4098-1191 PT Time Calculation (min): 30 min  PT Assessment / Plan / Recommendation  History of Present Illness Mr. Carpenito is a 52 year old male with past medical history significant for diabetes, CHF, and recent BKA for staph osteomyelitis, who is transferred from rehabilitation to the step down unit for hypotension & worsening hypoxemia.  Mr. Marinaro has been followed by cardiology since admission for three-vessel coronary artery disease which potentially require CABG.  This has been delayed to allow adequate treatment of his current infection.  Over the last 3 days he has had worsening hypotension and shortness of breath.  This was initially attributed to worsening heart failure and volume overload.  He has been diuresed with Lasix however remains volume overloaded.  On the night of September 26 he had a fall getting back into his bed.  At that time his wound dehisced and he developed bleeding from his wound requiring multiple dressing changes.  On the evening of 928 he had an episode of worsening shortness of breath after fluid bolus for hypotension.  Chest x-ray shows bilateral large pleural effusions as well as pulmonary edema   PT Comments   Pt admitted with above. Pt currently with functional limitations due to continued  deficits in areas of sitting balance and postural stability.   Pt will benefit from skilled PT to increase their independence and safety with mobility to allow discharge to the venue listed below.   Follow Up Recommendations  CIR                 Equipment Recommendations  Wheelchair (measurements PT);Wheelchair cushion (measurements PT);3in1 (PT);Rolling walker with 5" wheels    Recommendations for Other Services Rehab consult  Frequency Min 3X/week   Progress towards PT Goals Progress towards PT goals: Progressing toward goals  Plan Current  plan remains appropriate    Precautions / Restrictions Precautions Precautions: Fall Precaution Comments: R BKA Restrictions Weight Bearing Restrictions: No RLE Weight Bearing: Non weight bearing Other Position/Activity Restrictions: elevate operated limb, do not prop pillows under knee; try to position on LEFT side so that RIGHT hip can be in neutral/extension   Pertinent Vitals/Pain VSS, no pain    Mobility  Bed Mobility Bed Mobility: Supine to Sit;Sitting - Scoot to [place] of Bed Rolling Right: Not tested (comment) Rolling Left: Not tested (comment) Supine to Sit: 1: +2 Total assist;With rails;HOB elevated Supine to Sit: Patient Percentage: 60% Sitting - Scoot to Edge of Bed: 3: Mod assist Sit to Supine: Not Tested (comment) Details for Bed Mobility Assistance: Needed assist for trunk due to inability to use right UE to push himself up from sidelying.   Transfers Transfers: Sit to Stand;Stand to Sit Sit to Stand: 1: +2 Total assist;With upper extremity assist;From bed Sit to Stand: Patient Percentage: 30% Stand to Sit: 1: +2 Total assist;With upper extremity assist;To bed Stand to Sit: Patient Percentage: 40% Squat Pivot Transfers: Not tested (comment) Lateral/Scoot Transfers: Not tested (comment) Transfer via Lift Equipment: [place] Details for Transfer Assistance: Pt sit to stand x3 with use of pad as well as gait belt with pt using left UE to push up from bed.  Each time pt cleared bottom off of bed a little more using bed elevation to assist.  Even though pt was able to stand all the way up on his left LE, deemed it unsafe to transfer squat pivot to recliner as pt with  poor overall postural stability.  Used Maxi sky lift to lift pt to recliner at end of session.   Ambulation/Gait Ambulation/Gait Assistance: Not tested (comment) Stairs: No Wheelchair Mobility Wheelchair Mobility: No    Exercises General Exercises - Lower Extremity Long Arc Quad: AROM;Both;20  reps;Seated Hip ABduction/ADduction: AROM;Right;10 reps;Supine Amputee Exercises Quad Sets: AROM;Right;10 reps;Seated Other Exercises Other Exercises: proper positionoing   PT Goals (current goals can now be found in the care plan section)    Visit Information  Last PT Received On: 07/22/13 Assistance Needed: +2 History of Present Illness: Mr. Mccarey is a 52 year old male with past medical history significant for diabetes, CHF, and recent BKA for staph osteomyelitis, who is transferred from rehabilitation to the step down unit for hypotension & worsening hypoxemia.  Mr. Delay has been followed by cardiology since admission for three-vessel coronary artery disease which potentially require CABG.  This has been delayed to allow adequate treatment of his current infection.  Over the last 3 days he has had worsening hypotension and shortness of breath.  This was initially attributed to worsening heart failure and volume overload.  He has been diuresed with Lasix however remains volume overloaded.  On the night of September 26 he had a fall getting back into his bed.  At that time his wound dehisced and he developed bleeding from his wound requiring multiple dressing changes.  On the evening of 928 he had an episode of worsening shortness of breath after fluid bolus for hypotension.  Chest x-ray shows bilateral large pleural effusions as well as pulmonary edema    Subjective Data  Subjective: "I have fallen and I just don't think I can stand."   Cognition  Cognition Arousal/Alertness: Awake/alert Behavior During Therapy: Anxious Overall Cognitive Status: Within Functional Limits for tasks assessed    Balance  Static Sitting Balance Static Sitting - Balance Support: No upper extremity supported;Feet unsupported Static Sitting - Level of Assistance: 5: Stand by assistance Static Sitting - Comment/# of Minutes: 8 Dynamic Sitting Balance Dynamic Sitting - Balance Activities: Forward lean/weight  shifting;Lateral lean/weight shifting;Reaching for objects;Reaching across midline Dynamic Sitting - Comments: Pt able to reach at least 5-8 inches in front and to sides.   Static Standing Balance Static Standing - Balance Support: Left upper extremity supported;During functional activity Static Standing - Level of Assistance: 1: +2 Total assist;Patient percentage (comment) (pt = 40%) Static Standing - Comment/# of Minutes: Pt stood up x3 with first two attempts barely clearing bottom.  Last attempt, pt stood for 10 seconds and was upright but did not feel pt steady enough to pivot to recliner.    End of Session PT - End of Session Equipment Utilized During Treatment: Gait belt Activity Tolerance: Patient tolerated treatment well Patient left: in chair;with call bell/phone within reach Nurse Communication: Mobility status;Need for lift equipment        INGOLD,Lennart Gladish 07/22/2013, 2:48 PM [facility] [phone] (pager)

## 2013-07-23 LAB — GLUCOSE, CAPILLARY
Glucose-Capillary: 64 mg/dL — ABNORMAL LOW (ref 70–99)
Glucose-Capillary: 99 mg/dL (ref 70–99)
Glucose-Capillary: 138 mg/dL — ABNORMAL HIGH (ref 70–99)
Glucose-Capillary: 138 mg/dL — ABNORMAL HIGH (ref 70–99)

## 2013-07-23 LAB — COMPREHENSIVE METABOLIC PANEL
Albumin: 2.4 g/dL — ABNORMAL LOW (ref 3.5–5.2)
BUN: 62 mg/dL — ABNORMAL HIGH (ref 6–23)
Calcium: 8.2 mg/dL — ABNORMAL LOW (ref 8.4–10.5)
Chloride: 102 mEq/L (ref 96–112)
Creatinine, Ser: 1.68 mg/dL — ABNORMAL HIGH (ref 0.50–1.35)
GFR calc Af Amer: 53 mL/min — ABNORMAL LOW (ref >90)
Glucose, Bld: 74 mg/dL (ref 70–99)
Total Bilirubin: 0.4 mg/dL (ref 0.3–1.2)
Total Protein: 5.5 g/dL — ABNORMAL LOW (ref 6.0–8.3)

## 2013-07-23 LAB — CBC
HCT: 24.2 % — ABNORMAL LOW (ref 39.0–52.0)
Hemoglobin: 7.6 g/dL — ABNORMAL LOW (ref 13.0–17.0)
MCH: 26.8 pg (ref 26.0–34.0)
MCHC: 31.4 g/dL (ref 30.0–36.0)
MCV: 85.2 fL (ref 78.0–100.0)
Platelets: 162 10*3/uL (ref 150–400)
RBC: 2.84 MIL/uL — ABNORMAL LOW (ref 4.22–5.81)
RDW: 17 % — ABNORMAL HIGH (ref 11.5–15.5)
WBC: 7.1 10*3/uL (ref 4.0–10.5)

## 2013-07-23 MED ORDER — ASPIRIN 81 MG PO CHEW
81.0000 mg | CHEWABLE_TABLET | Freq: Every day | ORAL | Status: DC
  Administered 2013-07-23 – 2013-07-26 (×4): 81 mg via ORAL
  Filled 2013-07-23 (×4): qty 1

## 2013-07-23 MED ORDER — INSULIN DETEMIR 100 UNIT/ML ~~LOC~~ SOLN
25.0000 [IU] | Freq: Every day | SUBCUTANEOUS | Status: DC
  Administered 2013-07-23: 25 [IU] via SUBCUTANEOUS
  Filled 2013-07-23 (×2): qty 0.25

## 2013-07-23 NOTE — Progress Notes (Signed)
Physical Therapy Treatment Patient Details Name: DENYM RAHIMI MRN: [id number] DOB: December 17, 1960 Today's Date: 07/23/2013 Time: 6213-0865 PT Time Calculation (min): 24 min  PT Assessment / Plan / Recommendation  History of Present Illness Mr. Sampedro is a 52 year old male with past medical history significant for diabetes, CHF, and recent BKA for staph osteomyelitis, who is transferred from rehabilitation to the step down unit for hypotension & worsening hypoxemia.  Mr. Pruiett has been followed by cardiology since admission for three-vessel coronary artery disease which potentially require CABG.  This has been delayed to allow adequate treatment of his current infection.  Over the last 3 days he has had worsening hypotension and shortness of breath.  This was initially attributed to worsening heart failure and volume overload.  He has been diuresed with Lasix however remains volume overloaded.  On the night of September 26 he had a fall getting back into his bed.  At that time his wound dehisced and he developed bleeding from his wound requiring multiple dressing changes.  On the evening of 928 he had an episode of worsening shortness of breath after fluid bolus for hypotension.  Chest x-ray shows bilateral large pleural effusions as well as pulmonary edema   PT Comments   Pt admitted with above. Pt currently with functional limitations due to continued deficits with balance.  Pt will benefit from skilled PT to increase their independence and safety with mobility to allow discharge to the venue listed below.   Follow Up Recommendations  CIR                 Equipment Recommendations  Wheelchair (measurements PT);Wheelchair cushion (measurements PT);3in1 (PT);Rolling walker with 5" wheels    Recommendations for Other Services Rehab consult  Frequency Min 3X/week   Progress towards PT Goals Progress towards PT goals: Progressing toward goals  Plan Current plan remains appropriate    Precautions  / Restrictions Precautions Precautions: Fall Precaution Comments: R BKA Restrictions Weight Bearing Restrictions: No RLE Weight Bearing: Non weight bearing Other Position/Activity Restrictions: elevate operated limb, do not prop pillows under knee; try to position on LEFT side so that RIGHT hip can be in neutral/extension   Pertinent Vitals/Pain VSS, no pain   Mobility  Bed Mobility Bed Mobility: Rolling Right;Right Sidelying to Sit;Sitting - Scoot to Edge of Bed Rolling Right: 4: Min guard;With rail Rolling Left: Not tested (comment) Right Sidelying to Sit: 1: +2 Total assist;HOB elevated Right Sidelying to Sit: Patient Percentage: 60% Supine to Sit: 1: +2 Total assist;With rails;HOB elevated Supine to Sit: Patient Percentage: 60% Sitting - Scoot to Edge of Bed: 3: Mod assist Sit to Supine: Not Tested (comment) Details for Bed Mobility Assistance: Assisted pt onto the bed pan initially with pt rolling well to get onto and off of bed pan. Needed assist for trunk due to inability to use right UE to push himself up from sidelying.   Transfers Transfers: Lateral/Scoot Transfers Sit to Stand: Not tested (comment) Stand to Sit: Not tested (comment) Squat Pivot Transfers: Not tested (comment) Lateral/Scoot Transfers: 1: +2 Total assist;With armrests removed;From elevated surface Lateral Transfers: Patient Percentage: 50% Details for Transfer Assistance: Pt scooted to his right (drop arm recliner would only drop on that side).  Scooted to right with min assist to direct technique and mod assist to assist with pad.  Pt gave good effort.   Ambulation/Gait Ambulation/Gait Assistance: Not tested (comment) Stairs: No Wheelchair Mobility Wheelchair Mobility: No    Exercises General Exercises - Lower Extremity  Long Arc Quad: AROM;Both;20 reps;Seated Hip ABduction/ADduction: AROM;Right;10 reps;Supine Amputee Exercises Quad Sets: AROM;Right;10 reps;Seated Hip ABduction/ADduction:  AROM;Right;10 reps Knee Flexion: AROM;Right;10 reps Knee Extension: AROM;Right;10 reps;Seated Straight Leg Raises: AROM;Right;10 reps    PT Goals (current goals can now be found in the care plan section)    Visit Information  Last PT Received On: 07/23/13 Assistance Needed: +2 History of Present Illness: Mr. Cueto is a 52 year old male with past medical history significant for diabetes, CHF, and recent BKA for staph osteomyelitis, who is transferred from rehabilitation to the step down unit for hypotension & worsening hypoxemia.  Mr. Levitt has been followed by cardiology since admission for three-vessel coronary artery disease which potentially require CABG.  This has been delayed to allow adequate treatment of his current infection.  Over the last 3 days he has had worsening hypotension and shortness of breath.  This was initially attributed to worsening heart failure and volume overload.  He has been diuresed with Lasix however remains volume overloaded.  On the night of September 26 he had a fall getting back into his bed.  At that time his wound dehisced and he developed bleeding from his wound requiring multiple dressing changes.  On the evening of 928 he had an episode of worsening shortness of breath after fluid bolus for hypotension.  Chest x-ray shows bilateral large pleural effusions as well as pulmonary edema    Subjective Data  Subjective: "I need the bedpan."   Cognition  Cognition Arousal/Alertness: Awake/alert Behavior During Therapy: Anxious Overall Cognitive Status: Within Functional Limits for tasks assessed    Balance  Static Sitting Balance Static Sitting - Balance Support: No upper extremity supported;Feet unsupported Static Sitting - Level of Assistance: 5: Stand by assistance Static Sitting - Comment/# of Minutes: 5 Dynamic Sitting Balance Dynamic Sitting - Balance Activities: Forward lean/weight shifting;Lateral lean/weight shifting;Reaching for objects;Reaching  across midline Dynamic Sitting - Comments: min guard assist needed. Static Standing Balance Static Standing - Level of Assistance: Not tested (comment)  End of Session PT - End of Session Equipment Utilized During Treatment: Gait belt Activity Tolerance: Patient tolerated treatment well Patient left: in chair;with call bell/phone within reach Nurse Communication: Mobility status;Need for lift equipment        INGOLD,Kemyah Buser 07/23/2013, 10:13 AM [facility] [phone] (pager)

## 2013-07-23 NOTE — Consult Note (Signed)
WOC wound follow up note Wound type: non healing wound along the surgical site left foot.  Dressing orders had been changed to hydrogel, which is keeping this area too moist. Actually hydrogel sheet in place  Measurement: 0.5cm x 0.2cm x 0.1cm  Wound WUJ:WJXBJ this wound is very macerated, the wound bed is completely white and their is periwound hyperkeratosis - I was able to remove this today.  Drainage (amount, consistency, odor) none noted but the dressing in place does not really reveal the drainage. Periwound: macerated Dressing procedure/placement/frequency:  Silver hydrofiber for drainage control, bioburdan tx. Cover with dry dressing, wrap with kerlix. Change every other day.    Discussed POC with patient and bedside nurse.  Thanks  Remie Mathison [employer], CWOCN [phone])

## 2013-07-23 NOTE — Progress Notes (Signed)
[facility] Progress Note [place] TEAM 1 - Stepdown/ICU TEAM   Albert [name]:[id number] DOB: August 08, 1961 DOA: 07/14/2013 PCP: Rollene Rotunda, MD  Admit HPI / Brief Narrative: 52 yr old WM w/ a pmhx significant for HTN, CVA, ISCM, originally admitted 06/25/2013 with SIRS and MSSA bacteremia from gangrenous right foot.  He underwent a R BKA on 9/13. During that admission he became hypotensive and suffered a NSTEMI. He underwent LHC on 06/28/2013 with findings of 3v CAD with an EF of 40%. There were plans to perform CABG once he stabilized. He was subsequently transferred to the rehab floor 9/17.  While there he developed increasing SOB with increased weight (10 lbs over approx one week). He received IV lasix with only temporary improvement in his SOB. He was subsequently started on Cefepime and Vancomcyn for a concern of HCAP. (It was noted that the patient was on Ancef for MSSA bacteremia per ID (9/13 start date with plans to continue until 10/10) but this was discontinued when coverage was broadened for HCAP.)  On 07/14/2013 RRT was called to the rehab unit due to patient complaining of feeling unwell. He was found to have a BP of 85/57. He was noted to have decreased UOP with only 100 cc over about 7 hours. He was given a NS 250 cc bolus and transferred to the SDU.  BMET revealed a K of 5.9. EKG was performed with no evidence of peaked T waves. He was noted to have a Cr of 1.77, increased from 1.25 on 9/27. Later in that day, he developed hypoxic resp failure, with sats dropping into the mid 80% range despite NRB.  He was subsequently transferred to the ICU on the PCCM service.    Assessment/Plan:  Recent sepsis/MSSA bacteremia from gangrenous R foot s/p R BKA 06/29/13 admit 9/9->9/17->CIR - TEE negative for bacteremia 07/03/13 - to complete abx course 10/10  3-vessel CAD by cath 06/28/13 now with 2nd NSTEMI inital plan was for CABG once recovered from BKA - Cardiology following   Ischemic  cardiomyopathy with acute systolic CHF EF 40% by TTE 9/10 - 45-50% by TEE 9/17 - Cardiology following - remains volume overloaded - high dose Lasix continues - net negative approximately 10 L at this time - Cards has increased Hydralazine to 25 mg q 6 hrs (10/6)  Acute kidney injury Nephrology has signed off - required transient CVVHD - previously normal renal function - cont high dose lasix - UOP consistent for now - crt slowly improving   Acute blood loss anemia - bleeding from surgical site s/p fall  baseline appears to be around 12 - s/p transfusion - stable for now - no evidence of further blood loss from stump today - cont to avoid anticoag for now   Acute hypoxic respiratory failure secondary to anemia and volume overload Resolved - sats in mid 90s on RA  B Pleural effusions  F/u CXR suggests stability - clinically stabilizing   RUE edema  Persists - no DVT on doppler - elevate - follow   Scrotal edema Cont Foley - elvevate scrotum on towel   Sinus tachycardia  Persists - Cardiology managing beta blocker  Hypotension Resolved  Transaminitis shock liver - resolved  Hyperkalemia Resolved  Diabetes mellitus Hypoglycemic this morning- have decreased Levimir   Moderate mitral regurgitation by TEE 07/03/13  Code Status: FULL Family Communication: no family present at time of exam Disposition Plan: SDU  Consultants: Cardiology - [facility] >> [facility]  Procedures: CVVHD 9/29 >> 10/1 10/3 - RUE doppler - no DVT  Antibiotics: Ancef 06/29/2013 >> 07/11/2013 + 10/03 >> 07/26/2013 (planned) Cefepime 9/26>>> 10/03 Vanc 9/25 >>> 10/02  DVT prophylaxis: SCDs only due to recent bleeding   HPI/Subjective: Alert. Pt has no complaints.   Objective: Blood pressure 107/73, pulse 98, temperature 98.7 F (37.1 C), temperature source Oral, resp. rate 16, height 5\' 10"  (1.778 m), weight 127.2 kg (280 lb 6.8 oz), SpO2 96.00%.  Intake/Output Summary  (Last 24 hours) at 07/23/13 1754 Last data filed at 07/23/13 1612  Gross per 24 hour  Intake    958 ml  Output   6800 ml  Net  -5842 ml   Exam: General: No acute respiratory distress Vascular: Right neck hemodialysis catheter with pigtail, left upper extremity PICC line Lungs: difficult to auscultate- no crackles heard Cardiovascular: Tachycardic but regular without gallop or rub appreciable Abdomen: Nontender, nondistended, soft, bowel sounds positive, no rebound, no appreciable mass Extremities: Bilateral lower extremity wounds dressed and dry, diffuse edema at 2+ with right upper extremity 3+ - scrotal edema without erythema or appreciable breakdown  Data Reviewed: Basic Metabolic Panel:  Recent Labs Lab 07/17/13 0445 07/17/13 0500 07/17/13 1600 07/18/13 0500 07/19/13 0445 07/20/13 0450 07/21/13 0500 07/22/13 0500 07/23/13 0500  NA 132*  --  129* 131* 131* 134* 134* 138 142  K 5.3*  --  5.7* 5.1 4.8 4.7 4.7 4.4 3.9  CL 100  --  98 98 97 98 98 101 102  CO2 24  --  23 25 25 24 26 27 31   GLUCOSE 162*  --  296* 189* 151* 187* 224* 157* 74  BUN 41*  --  43* 46* 51* 55* 60* 63* 62*  CREATININE 1.41*  --  1.71* 1.78* 1.83* 1.89* 1.84* 1.71* 1.68*  CALCIUM 6.6*  --  6.9* 7.1* 7.3* 7.5* 7.9* 7.8* 8.2*  MG  --  2.4  --  2.4 2.3  --   --   --   --   PHOS 2.9  --  3.4 3.8 3.7  --   --  4.7*  --    Liver Function Tests:  Recent Labs Lab 07/17/13 0500 07/17/13 1600 07/18/13 0500 07/21/13 0500 07/22/13 0500 07/23/13 0500  AST 380*  --   --  32  --  24  ALT 509*  --   --  87*  --  19  ALKPHOS 96  --   --  83  --  68  BILITOT 0.5  --   --  0.5  --  0.4  PROT 5.6*  --   --  6.1  --  5.5*  ALBUMIN 2.3* 2.4* 2.4* 2.6* 2.5* 2.4*    CBC:  Recent Labs Lab 07/19/13 0445 07/20/13 0450 07/21/13 0500 07/22/13 0500 07/23/13 0500  WBC 10.3 9.1 9.7 7.2 7.1  HGB 7.6* 7.6* 7.7* 7.8* 7.6*  HCT 24.3* 24.2* 24.9* 24.6* 24.2*  MCV 85.9 86.1 85.6 85.1 85.2  PLT 171 173 179 158  162   Cardiac Enzymes: No results found for this basename: CKTOTAL, CKMB, CKMBINDEX, TROPONINI,  in the last 168 hours BNP (last 3 results)  Recent Labs  07/11/13 1630  PROBNP 23575.0*   CBG:  Recent Labs Lab 07/22/13 1709 07/22/13 2214 07/23/13 0749 07/23/13 1204 07/23/13 1627  GLUCAP 132* 177* 64* 99 138*    Recent Results (from the past 240 hour(s))  MRSA PCR SCREENING     Status: None   Collection Time  07/14/13  9:53 PM      Result Value Range Status   MRSA by PCR NEGATIVE  NEGATIVE Final   Comment:            The GeneXpert MRSA Assay (FDA     approved for NASAL specimens     only), is one component of a     comprehensive MRSA colonization     surveillance program. It is not     intended to diagnose MRSA     infection nor to guide or     monitor treatment for     MRSA infections.     Studies:  Recent x-ray studies have been reviewed in detail by the Attending Physician  Scheduled Meds:  Scheduled Meds: . antiseptic oral rinse  15 mL Mouth Rinse BID  . aspirin  81 mg Oral Daily  . carvedilol  12.5 mg Oral BID WC  .  ceFAZolin (ANCEF) IV  2 g Intravenous Q8H  . famotidine  20 mg Oral BID  . furosemide  160 mg Intravenous Q6H  . hydrALAZINE  25 mg Oral Q6H  . insulin aspart  0-15 Units Subcutaneous TID WC  . insulin detemir  25 Units Subcutaneous QHS  . polyethylene glycol  17 g Oral Daily  . senna-docusate  1 tablet Oral BID  . sodium chloride  3 mL Intravenous Q12H    Time spent on care of this patient: 35 mins   Albert Cantor, MD  [facility] Office  [phone] Pager - Text Page per Loretha Stapler as per below:  On-Call/Text Page:      [email or web address]      password [email or web address]  If 7PM-7AM, please contact night-coverage [email or web address] Password [email or web address] 07/23/2013, 5:54 PM   LOS: 9 days

## 2013-07-23 NOTE — Progress Notes (Signed)
Subjective:  No chest pain or increased dyspnea. Hgb remaining stable. Objective:  Vital Signs in the last 24 hours: Temp:  [98.1 F (36.7 C)-99.6 F (37.6 C)] 98.5 F (36.9 C) (10/07 0746) Pulse Rate:  [98-112] 98 (10/06 2020) Resp:  [16] 16 (10/07 0746) BP: (113-138)/(62-102) 119/85 mmHg (10/07 0746) SpO2:  [92 %-95 %] 92 % (10/07 0746) Weight:  [280 lb 6.8 oz (127.2 kg)] 280 lb 6.8 oz (127.2 kg) (10/07 0447)  Intake/Output from previous day: 10/06 0701 - 10/07 0700 In: 1576 [P.O.:600; I.V.:330; IV Piggyback:646] Out: 5600 [Urine:5600] Intake/Output from this shift: Total I/O In: -  Out: 1100 [Urine:1100]  . antiseptic oral rinse  15 mL Mouth Rinse BID  . carvedilol  12.5 mg Oral BID WC  .  ceFAZolin (ANCEF) IV  2 g Intravenous Q8H  . famotidine  20 mg Oral BID  . furosemide  160 mg Intravenous Q6H  . hydrALAZINE  25 mg Oral Q6H  . insulin aspart  0-15 Units Subcutaneous TID WC  . insulin detemir  25 Units Subcutaneous QHS  . polyethylene glycol  17 g Oral Daily  . senna-docusate  1 tablet Oral BID  . sodium chloride  3 mL Intravenous Q12H   . sodium chloride 10 mL/hr at 07/21/13 0800    Physical Exam: The patient appears to be in no distress.  Chest no wheezing. Decreased breath sounds at bases.  Heart reveals S3 gallop.  The abdomen is nontender.  Extremities marked edema of right arm and both legs. Right stump to be redressed today.  Neurologic exam is normal strength and no lateralizing weakness.  No sensory deficits.  Integument reveals no rash  Lab Results:  Recent Labs  07/22/13 0500 07/23/13 0500  WBC 7.2 7.1  HGB 7.8* 7.6*  PLT 158 162    Recent Labs  07/22/13 0500 07/23/13 0500  NA 138 142  K 4.4 3.9  CL 101 102  CO2 27 31  GLUCOSE 157* 74  BUN 63* 62*  CREATININE 1.71* 1.68*   No results found for this basename: TROPONINI, CK, MB,  in the last 72 hours Hepatic Function Panel  Recent Labs  07/23/13 0500  PROT 5.5*    ALBUMIN 2.4*  AST 24  ALT 19  ALKPHOS 68  BILITOT 0.4   No results found for this basename: CHOL,  in the last 72 hours No results found for this basename: PROTIME,  in the last 72 hours  Imaging: Imaging results have been reviewed  Cardiac Studies: Telemetry shows sinus tachycardia. Assessment/Plan:  1. Recent sepsis/MSSA bacteremia (adm 9/9->9/17->CIR) from gangrenous R foot s/p R BKA 06/29/13, TEE negative for vegetation 07/03/13  2. 3-vessel CAD by cath 06/28/13 (inital plan was for CABG once recovered from BKA), now with 2nd NSTEMI secondary to demand ischemia. Continue carvedilol  12.5 mg bid.  3. Ischemic cardiomyopathy with acute systolic CHF (EF 16-10% by echo on 07/15/13. Moderate mitral regurgitation by TEE 07/03/13 .  Still very volume overloaded. Continue IV lasix. BP tolerating higher dose hydralazine. 4. Acute hypoxic respiratory failure secondary to anemia, pulmonary edema, and bilateral pleural effusions  5. Hypotension due to circulatory shock, now stable off pressors.  6. Acute symptomatic anemia. Hemoglobin stable at 7.6  No sign of active bleeding. Will restart ASA 81 daily. Will not restart plavix yet. 7. Acute renal failure, improving slowly.  Plan: continue hydralazine today to 25 mg q6h. Continue lasix. Restart ASA 81 mg daily for his known severe  CAD   LOS: 9 days    Cassell Clement 07/23/2013, 9:20 AM

## 2013-07-24 LAB — BASIC METABOLIC PANEL
CO2: 33 mEq/L — ABNORMAL HIGH (ref 19–32)
Chloride: 102 mEq/L (ref 96–112)
Creatinine, Ser: 1.54 mg/dL — ABNORMAL HIGH (ref 0.50–1.35)
GFR calc Af Amer: 59 mL/min — ABNORMAL LOW (ref >90)
Glucose, Bld: 84 mg/dL (ref 70–99)
Potassium: 3.7 mEq/L (ref 3.5–5.1)
Sodium: 143 mEq/L (ref 135–145)

## 2013-07-24 LAB — GLUCOSE, CAPILLARY: Glucose-Capillary: 150 mg/dL — ABNORMAL HIGH (ref 70–99)

## 2013-07-24 MED ORDER — INSULIN DETEMIR 100 UNIT/ML ~~LOC~~ SOLN
23.0000 [IU] | Freq: Every day | SUBCUTANEOUS | Status: DC
  Administered 2013-07-24 – 2013-07-25 (×2): 23 [IU] via SUBCUTANEOUS
  Filled 2013-07-24 (×3): qty 0.23

## 2013-07-24 MED ORDER — CARVEDILOL 25 MG PO TABS
25.0000 mg | ORAL_TABLET | Freq: Two times a day (BID) | ORAL | Status: DC
  Administered 2013-07-24 – 2013-07-26 (×3): 25 mg via ORAL
  Filled 2013-07-24 (×7): qty 1

## 2013-07-24 NOTE — Evaluation (Signed)
Occupational Therapy Evaluation Patient Details Name: Albert [name] MRN: [id number] DOB: [birth year] Today's Date: 07/24/2013 Time: 0755-0809 OT Time Calculation (min): 14 min  OT Assessment / Plan / Recommendation History of present illness Albert [name] is a 52 year old male with past medical history significant for diabetes, CHF, and recent BKA for staph osteomyelitis, who is transferred from rehabilitation to the step down unit for hypotension & worsening hypoxemia.  Albert [name] has been followed by cardiology since admission for three-vessel coronary artery disease which potentially require CABG.  This has been delayed to allow adequate treatment of his current infection.  Over the last 3 days he has had worsening hypotension and shortness of breath.  This was initially attributed to worsening heart failure and volume overload.  He has been diuresed with Lasix however remains volume overloaded.  On the night of September 26 he had a fall getting back into his bed.  At that time his wound dehisced and he developed bleeding from his wound requiring multiple dressing changes.  On the evening of 928 he had an episode of worsening shortness of breath after fluid bolus for hypotension.  Chest x-ray shows bilateral large pleural effusions as well as pulmonary edema   Clinical Impression   Pt admitted with above. Will continue to follow pt acutely in order to address below problem list. Recommend pt return to CIR to further maximize independence with ADLs prior to eventual return home.    OT Assessment  Patient needs continued OT Services    Follow Up Recommendations  CIR    Barriers to Discharge      Equipment Recommendations  Tub/shower seat;3 in 1 bedside comode;Other (comment)    Recommendations for Other Services Rehab consult  Frequency  Min 2X/week    Precautions / Restrictions Precautions Precautions: Fall Precaution Comments: R BKA Restrictions Weight Bearing Restrictions: Yes RLE  Weight Bearing: Non weight bearing   Pertinent Vitals/Pain See vitals    ADL  Grooming: Performed;Wash/dry face;Set up Where Assessed - Grooming: Supine, head of bed up Upper Body Bathing: Simulated;Minimal assistance Where Assessed - Upper Body Bathing: Supine, head of bed up Lower Body Bathing: Simulated;Maximal assistance Where Assessed - Lower Body Bathing: Rolling right and/or left;Supine, head of bed up Upper Body Dressing: Simulated;Minimal assistance Where Assessed - Upper Body Dressing: Supine, head of bed up Lower Body Dressing: Simulated;Maximal assistance Where Assessed - Lower Body Dressing: Supine, head of bed up;Rolling right and/or left    OT Diagnosis: Generalized weakness;Acute pain  OT Problem List: Decreased strength;Decreased range of motion;Decreased activity tolerance;Impaired balance (sitting and/or standing);Decreased safety awareness;Decreased knowledge of use of DME or AE;Decreased knowledge of precautions;Obesity;Impaired sensation;Impaired UE functional use;Pain;Increased edema;Impaired tone;Cardiopulmonary status limiting activity;Decreased coordination OT Treatment Interventions: Self-care/ADL training;Therapeutic exercise;Energy conservation;DME and/or AE instruction;Therapeutic activities;Patient/family education;Balance training   OT Goals(Current goals can be found in the care plan section) Acute Rehab OT Goals Patient Stated Goal: to go home OT Goal Formulation: With patient Time For Goal Achievement: 08/07/13 Potential to Achieve Goals: Good  Visit Information  Last OT Received On: 07/24/13 Assistance Needed: +2 History of Present Illness: Albert [name] is a 52 year old male with past medical history significant for diabetes, CHF, and recent BKA for staph osteomyelitis, who is transferred from rehabilitation to the step down unit for hypotension & worsening hypoxemia.  Albert [name] has been followed by cardiology since admission for three-vessel coronary  artery disease which potentially require CABG.  This has been delayed to allow adequate treatment of his current infection.  Over the last 3 days he has had worsening hypotension and shortness of breath.  This was initially attributed to worsening heart failure and volume overload.  He has been diuresed with Lasix however remains volume overloaded.  On the night of September 26 he had a fall getting back into his bed.  At that time his wound dehisced and he developed bleeding from his wound requiring multiple dressing changes.  On the evening of 928 he had an episode of worsening shortness of breath after fluid bolus for hypotension.  Chest x-ray shows bilateral large pleural effusions as well as pulmonary edema       Prior Functioning     Home Living Family/patient expects to be discharged to:: Private residence Living Arrangements: Spouse/significant other Available Help at Discharge: Available PRN/intermittently Type of Home: House Home Access: Level entry Entrance Stairs-Number of Steps: One step from garage into car port, then level entry into home Home Layout: Two level Alternate Level Stairs-Number of Steps: 1 step between living areas Alternate Level Stairs-Rails: None Home Equipment: Walker - 2 wheels;Wheelchair - manual Additional Comments: Patient owns RW, shower chair, and wheelchair.  Lives With: Spouse;Family Prior Function Level of Independence: Independent         Vision/Perception Vision - History Baseline Vision: Wears glasses only for reading Patient Visual Report: No change from baseline Vision - Assessment Eye Alignment: Within Functional Limits   Cognition  Cognition Arousal/Alertness: Awake/alert Behavior During Therapy: Flat affect Overall Cognitive Status: Within Functional Limits for tasks assessed    Extremity/Trunk Assessment Upper Extremity Assessment Upper Extremity Assessment: RUE deficits/detail RUE Deficits / Details: residual weakness from  old CVA RUE Sensation: decreased light touch;decreased proprioception RUE Coordination: decreased fine motor;decreased gross motor     Mobility Bed Mobility Bed Mobility: Rolling Right;Rolling Left Rolling Right: 4: Min guard;With rail Rolling Left: 4: Min guard;With rail Right Sidelying to Sit: HOB elevated;With rails;1: +2 Total assist Right Sidelying to Sit: Patient Percentage: 50% Sit to Supine: 2: Max assist;HOB flat Details for Bed Mobility Assistance: Assist to elevate trunk and support LEs OOB. Use of elevated HOB for ease. Pt unable to tolerate sitting EOB >1 minute and quickly returned to supine in bed. Use of draw pad to brings hips to EOB.     Exercise     Balance Static Sitting Balance Static Sitting - Balance Support: Left upper extremity supported;Feet unsupported Static Sitting - Level of Assistance: 4: Min assist Static Sitting - Comment/# of Minutes: 1 minute   End of Session OT - End of Session Activity Tolerance: Patient limited by pain Patient left: in bed;with call bell/phone within reach  GO    07/24/2013 Cipriano Mile OTR/L Pager [phone] Office [phone]  Cipriano Mile 07/24/2013, 5:03 PM

## 2013-07-24 NOTE — Progress Notes (Signed)
     Subjective:  No chest pain or increased dyspnea. Hgb remaining stable. Rhythm stable NSR 100/min.  Good diuresis and weight loss. Objective:  Vital Signs in the last 24 hours: Temp:  [98 F (36.7 C)-99.2 F (37.3 C)] 99 F (37.2 C) (10/08 0800) Pulse Rate:  [100-101] 101 (10/07 2346) Resp:  [16-18] 18 (10/07 1952) BP: (107-131)/(68-96) 112/74 mmHg (10/08 0800) SpO2:  [94 %-96 %] 95 % (10/08 0800) Weight:  [268 lb 11.9 oz (121.9 kg)] 268 lb 11.9 oz (121.9 kg) (10/08 0354)  Intake/Output from previous day: 10/07 0701 - 10/08 0700 In: 924 [P.O.:390; I.V.:120; IV Piggyback:414] Out: 7300 [Urine:7300] Intake/Output from this shift:    . aspirin  81 mg Oral Daily  . carvedilol  12.5 mg Oral BID WC  .  ceFAZolin (ANCEF) IV  2 g Intravenous Q8H  . famotidine  20 mg Oral BID  . furosemide  160 mg Intravenous Q6H  . hydrALAZINE  25 mg Oral Q6H  . insulin aspart  0-15 Units Subcutaneous TID WC  . insulin detemir  25 Units Subcutaneous QHS  . polyethylene glycol  17 g Oral Daily  . senna-docusate  1 tablet Oral BID  . sodium chloride  3 mL Intravenous Q12H   . sodium chloride 10 mL/hr at 07/23/13 1836    Physical Exam: The patient appears to be in no distress.  Chest no wheezing. Decreased breath sounds at bases.  Heart reveals S3 gallop.  The abdomen is nontender.  Extremities marked edema of right arm and both legs. Right stump to be redressed today.  Neurologic exam is normal strength and no lateralizing weakness.  No sensory deficits.  Integument reveals no rash  Lab Results:  Recent Labs  07/22/13 0500 07/23/13 0500  WBC 7.2 7.1  HGB 7.8* 7.6*  PLT 158 162    Recent Labs  07/23/13 0500 07/24/13 0530  NA 142 143  K 3.9 3.7  CL 102 102  CO2 31 33*  GLUCOSE 74 84  BUN 62* 54*  CREATININE 1.68* 1.54*   No results found for this basename: TROPONINI, CK, MB,  in the last 72 hours Hepatic Function Panel  Recent Labs  07/23/13 0500  PROT 5.5*    ALBUMIN 2.4*  AST 24  ALT 19  ALKPHOS 68  BILITOT 0.4   No results found for this basename: CHOL,  in the last 72 hours No results found for this basename: PROTIME,  in the last 72 hours  Imaging: Imaging results have been reviewed  Cardiac Studies: Telemetry shows sinus tachycardia. Assessment/Plan:  1. Recent sepsis/MSSA bacteremia (adm 9/9->9/17->CIR) from gangrenous R foot s/p R BKA 06/29/13, TEE negative for vegetation 07/03/13  2. 3-vessel CAD by cath 06/28/13 (inital plan was for CABG once recovered from BKA), now with 2nd NSTEMI secondary to demand ischemia. Continue carvedilol  12.5 mg bid.  3. Ischemic cardiomyopathy with acute systolic CHF (EF 16-10% by echo on 07/15/13. Moderate mitral regurgitation by TEE 07/03/13 .  BP tolerating higher dose hydralazine. 4. Acute hypoxic respiratory failure secondary to anemia, pulmonary edema, and bilateral pleural effusions  5. Hypotension due to circulatory shock, now stable off pressors.  6. Acute symptomatic anemia. Hemoglobin stable.  No sign of active bleeding. Will restart ASA 81 daily. Will not restart plavix yet. 7. Acute renal failure, improving slowly.  Plan: Increase carvedilol to 25 mg BID   LOS: 10 days    Cassell Clement 07/24/2013, 8:34 AM

## 2013-07-24 NOTE — Progress Notes (Addendum)
[facility] Progress Note [place] TEAM 1 - Stepdown/ICU TEAM   Albert [name]:[id number] DOB: 03/10/1961 DOA: 07/14/2013 PCP: Rollene Rotunda, MD  Admit HPI / Brief Narrative: 52 yr old WM w/ a pmhx significant for HTN, CVA, ISCM, originally admitted 06/25/2013 with SIRS and MSSA bacteremia from gangrenous right foot.  He underwent a R BKA on 9/13. During that admission he became hypotensive and suffered a NSTEMI. He underwent LHC on 06/28/2013 with findings of 3v CAD with an EF of 40%. There were plans to perform CABG once he stabilized. He was subsequently transferred to the rehab floor 9/17.  While there he developed increasing SOB with increased weight (10 lbs over approx one week). He received IV lasix with only temporary improvement in his SOB. He was subsequently started on Cefepime and Vancomcyn for a concern of HCAP. (It was noted that the patient was on Ancef for MSSA bacteremia per ID (9/13 start date with plans to continue until 10/10) but this was discontinued when coverage was broadened for HCAP.)  On 07/14/2013 RRT was called to the rehab unit due to patient complaining of feeling unwell. He was found to have a BP of 85/57. He was noted to have decreased UOP with only 100 cc over about 7 hours. He was given a NS 250 cc bolus and transferred to the SDU.  BMET revealed a K of 5.9. EKG was performed with no evidence of peaked T waves. He was noted to have a Cr of 1.77, increased from 1.25 on 9/27. Later in that day, he developed hypoxic resp failure, with sats dropping into the mid 80% range despite NRB.  He was subsequently transferred to the ICU on the PCCM service.    Assessment/Plan:  Recent sepsis/MSSA bacteremia from gangrenous R foot s/p R BKA 06/29/13 admit 9/9->9/17->CIR - TEE negative for bacteremia 07/03/13 - to complete abx course 10/10  3-vessel CAD by cath 06/28/13 now with 2nd NSTEMI inital plan was for CABG once recovered from BKA - Cardiology following   Ischemic  cardiomyopathy with acute systolic CHF EF 40% by TTE 9/10 - 45-50% by TEE 9/17 - Cardiology following - remains volume overloaded but is improving - high dose Lasix continues - net negative approximately 21 L at this time - Cards increasing BB  Acute kidney injury Nephrology has signed off - required transient CVVHD - previously normal renal function - cont high dose lasix - UOP consistent for now - crt cont to improve   Acute blood loss anemia - bleeding from surgical site s/p fall  baseline appears to be around 12 - s/p transfusion - stable for now - no evidence of further blood loss from stump today - cont to avoid anticoag for now - recheck cbc in am  Acute hypoxic respiratory failure secondary to anemia and volume overload Resolved - sats in mid 90s on RA  B Pleural effusions  F/u CXR suggests stability - clinically stabilizing   RUE edema  Persists but improving - no DVT on doppler - elevate   Scrotal edema Cont Foley - elvevate scrotum on towel - slightly improved  Sinus tachycardia  Persists but improving - Cardiology adjusting beta blocker  Hypotension Resolved  Transaminitis shock liver - resolved  Hyperkalemia Resolved  Diabetes mellitus CBG stable at this time  Moderate mitral regurgitation by TEE 07/03/13  Code Status: FULL Family Communication: no family present at time of exam Disposition Plan: stable for transfer to tele bed - continue aggressive diuresis -  follow Hgb - eventual return to CIR to prepare for CABG  Consultants: Cardiology - [facility] Pace PCCM >> [facility]  Procedures: CVVHD 9/29 >> 10/1 10/3 - RUE doppler - no DVT  Antibiotics: Ancef 06/29/2013 >> 07/11/2013 + 10/03 >> 07/26/2013 (planned) Cefepime 9/26>>> 10/03 Vanc 9/25 >>> 10/02  DVT prophylaxis: SCDs only due to recent bleeding   HPI/Subjective: Resting comfortably.  No shortness of breath or chest pain.    Objective: Blood pressure 112/74, pulse 101,  temperature 99 F (37.2 C), temperature source Oral, resp. rate 18, height 5\' 10"  (1.778 m), weight 121.9 kg (268 lb 11.9 oz), SpO2 95.00%.  Intake/Output Summary (Last 24 hours) at 07/24/13 1121 Last data filed at 07/24/13 1000  Gross per 24 hour  Intake   1444 ml  Output   7650 ml  Net  -6206 ml   Exam: General: No acute respiratory distress Vascular: Right neck hemodialysis catheter with pigtail, left upper extremity PICC line Lungs: distant bs th/o - no crackles heard Cardiovascular: Tachycardic but regular without gallop or rub appreciable Abdomen: Nontender, nondistended, soft, bowel sounds positive, no rebound, no appreciable mass - obese Extremities: Bilateral lower extremity wounds dressed and dry, diffuse edema at 1+ with right upper extremity 2+ - scrotal edema without erythema or appreciable breakdown  Data Reviewed: Basic Metabolic Panel:  Recent Labs Lab 07/17/13 1600 07/18/13 0500 07/19/13 0445 07/20/13 0450 07/21/13 0500 07/22/13 0500 07/23/13 0500 07/24/13 0530  NA 129* 131* 131* 134* 134* 138 142 143  K 5.7* 5.1 4.8 4.7 4.7 4.4 3.9 3.7  CL 98 98 97 98 98 101 102 102  CO2 23 25 25 24 26 27 31  33*  GLUCOSE 296* 189* 151* 187* 224* 157* 74 84  BUN 43* 46* 51* 55* 60* 63* 62* 54*  CREATININE 1.71* 1.78* 1.83* 1.89* 1.84* 1.71* 1.68* 1.54*  CALCIUM 6.9* 7.1* 7.3* 7.5* 7.9* 7.8* 8.2* 8.0*  MG  --  2.4 2.3  --   --   --   --   --   PHOS 3.4 3.8 3.7  --   --  4.7*  --   --    Liver Function Tests:  Recent Labs Lab 07/17/13 1600 07/18/13 0500 07/21/13 0500 07/22/13 0500 07/23/13 0500  AST  --   --  32  --  24  ALT  --   --  87*  --  19  ALKPHOS  --   --  83  --  68  BILITOT  --   --  0.5  --  0.4  PROT  --   --  6.1  --  5.5*  ALBUMIN 2.4* 2.4* 2.6* 2.5* 2.4*    CBC:  Recent Labs Lab 07/19/13 0445 07/20/13 0450 07/21/13 0500 07/22/13 0500 07/23/13 0500  WBC 10.3 9.1 9.7 7.2 7.1  HGB 7.6* 7.6* 7.7* 7.8* 7.6*  HCT 24.3* 24.2* 24.9* 24.6*  24.2*  MCV 85.9 86.1 85.6 85.1 85.2  PLT 171 173 179 158 162   BNP (last 3 results)  Recent Labs  07/11/13 1630  PROBNP 23575.0*   CBG:  Recent Labs Lab 07/23/13 0749 07/23/13 1204 07/23/13 1627 07/23/13 2142 07/24/13 0801  GLUCAP 64* 99 138* 138* 71    Recent Results (from the past 240 hour(s))  MRSA PCR SCREENING     Status: None   Collection Time    07/14/13  9:53 PM      Result Value Range Status   MRSA by PCR NEGATIVE  NEGATIVE Final   Comment:            The GeneXpert MRSA Assay (FDA     approved for NASAL specimens     only), is one component of a     comprehensive MRSA colonization     surveillance program. It is not     intended to diagnose MRSA     infection nor to guide or     monitor treatment for     MRSA infections.     Studies:  Recent x-ray studies have been reviewed in detail by the Attending Physician  Scheduled Meds:  Scheduled Meds: . aspirin  81 mg Oral Daily  . carvedilol  25 mg Oral BID WC  .  ceFAZolin (ANCEF) IV  2 g Intravenous Q8H  . famotidine  20 mg Oral BID  . furosemide  160 mg Intravenous Q6H  . hydrALAZINE  25 mg Oral Q6H  . insulin aspart  0-15 Units Subcutaneous TID WC  . insulin detemir  25 Units Subcutaneous QHS  . polyethylene glycol  17 g Oral Daily  . senna-docusate  1 tablet Oral BID  . sodium chloride  3 mL Intravenous Q12H    Time spent on care of this patient: 35 mins   Duyen Beckom T, MD  [facility] Office  [phone] Pager - Text Page per Loretha Stapler as per below:  On-Call/Text Page:      [email or web address]      password [email or web address]  If 7PM-7AM, please contact night-coverage [email or web address] Password [email or web address] 07/24/2013, 11:21 AM   LOS: 10 days

## 2013-07-24 NOTE — Progress Notes (Signed)
Inpatient Diabetes Program Recommendations  AACE/ADA: New Consensus Statement on Inpatient Glycemic Control (2013)  Target Ranges:  Prepandial:   less than 140 mg/dL      Peak postprandial:   less than 180 mg/dL (1-2 hours)      Critically ill patients:  140 - 180 mg/dL  Results for CONSTANTINOS, KREMPASKY (MRN [id number]) as of 07/24/2013 10:50  Ref. Range 07/23/2013 07:49 07/23/2013 12:04 07/23/2013 16:27 07/23/2013 21:42 07/24/2013 08:01  Glucose-Capillary Latest Range: 70-99 mg/dL 64 (L) 99 409 (H) 811 (H) 71   Inpatient Diabetes Program Recommendations Insulin - Basal: consider decreasing Levemir to 20 units (home dose) Thank you  Piedad Climes BSN, RN,CDE Inpatient Diabetes Coordinator [phone] (team pager)

## 2013-07-25 LAB — GLUCOSE, CAPILLARY
Glucose-Capillary: 150 mg/dL — ABNORMAL HIGH (ref 70–99)
Glucose-Capillary: 200 mg/dL — ABNORMAL HIGH (ref 70–99)

## 2013-07-25 LAB — CBC
HCT: 25 % — ABNORMAL LOW (ref 39.0–52.0)
MCHC: 30.8 g/dL (ref 30.0–36.0)
MCV: 85.3 fL (ref 78.0–100.0)
Platelets: 183 10*3/uL (ref 150–400)
RBC: 2.93 MIL/uL — ABNORMAL LOW (ref 4.22–5.81)
RDW: 16.7 % — ABNORMAL HIGH (ref 11.5–15.5)
WBC: 5.8 10*3/uL (ref 4.0–10.5)

## 2013-07-25 LAB — BASIC METABOLIC PANEL
BUN: 49 mg/dL — ABNORMAL HIGH (ref 6–23)
CO2: 33 mEq/L — ABNORMAL HIGH (ref 19–32)
Chloride: 100 mEq/L (ref 96–112)
Creatinine, Ser: 1.74 mg/dL — ABNORMAL HIGH (ref 0.50–1.35)

## 2013-07-25 MED ORDER — FUROSEMIDE 10 MG/ML IJ SOLN
160.0000 mg | Freq: Two times a day (BID) | INTRAVENOUS | Status: DC
  Administered 2013-07-26 (×2): 160 mg via INTRAVENOUS
  Filled 2013-07-25 (×2): qty 16

## 2013-07-25 NOTE — Progress Notes (Signed)
ANTIBIOTIC CONSULT NOTE - FOLLOW UP  Pharmacy Consult for Ancef Indication: MSSA bacteremia  Allergies  Allergen Reactions  . Amoxicillin Nausea And Vomiting    Patient Measurements: Height: 5\' 10"  (177.8 cm) Weight: 257 lb 0.9 oz (116.6 kg) IBW/kg (Calculated) : 73  Vital Signs: Temp: 98.3 F (36.8 C) (10/09 0356) Temp src: Oral (10/09 0356) BP: 122/81 mmHg (10/09 1021) Pulse Rate: 102 (10/09 1021) Intake/Output from previous day: 10/08 0701 - 10/09 0700 In: 1381 [P.O.:1200; I.V.:65; IV Piggyback:116] Out: 3250 [Urine:3250] Intake/Output from this shift:    Labs:  Recent Labs  07/23/13 0500 07/24/13 0530 07/25/13 0530 07/25/13 1005  WBC 7.1  --  5.8  --   HGB 7.6*  --  7.7*  --   PLT 162  --  183  --   CREATININE 1.68* 1.54*  --  1.74*   Estimated Creatinine Clearance: 64.9 ml/min (by C-G formula based on Cr of 1.74). No results found for this basename: VANCOTROUGH, Leodis Binet, VANCORANDOM, GENTTROUGH, GENTPEAK, GENTRANDOM, TOBRATROUGH, TOBRAPEAK, TOBRARND, AMIKACINPEAK, AMIKACINTROU, AMIKACIN,  in the last 72 hours   Microbiology: Recent Results (from the past 720 hour(s))  CULTURE, BLOOD (ROUTINE X 2)     Status: None   Collection Time    06/26/13  1:00 AM      Result Value Range Status   Specimen Description BLOOD RIGHT ARM   Final   Special Requests     Final   Value: BOTTLES DRAWN AEROBIC AND ANAEROBIC  ,   Culture  Setup Time     Final   Value: 06/26/2013 04:11     Performed at [facility]   Culture     Final   Value: STAPHYLOCOCCUS AUREUS     Note: SUSCEPTIBILITIES PERFORMED ON PREVIOUS CULTURE WITHIN THE LAST 5 DAYS.     Note: Gram Stain Report Called to,Read Back By and Verified With: STACY PHELPS 06/28/13 1155 BY SMITHERSJ     Performed at [facility]   Report Status 06/30/2013 FINAL   Final  CULTURE, BLOOD (ROUTINE X 2)     Status: None   Collection Time    06/26/13  1:08 AM      Result Value Range Status    Specimen Description BLOOD RIGHT ARM   Final   Special Requests BOTTLES DRAWN AEROBIC AND ANAEROBIC    Final   Culture  Setup Time     Final   Value: 06/26/2013 04:10     Performed at [facility]   Culture     Final   Value: STAPHYLOCOCCUS AUREUS     Note: RIFAMPIN AND GENTAMICIN SHOULD NOT BE USED AS SINGLE DRUGS FOR TREATMENT OF STAPH INFECTIONS.     Note: Gram Stain Report Called to,Read Back By and Verified With: JOHN HUNT ON 06/28/2013 AT 8:21P BY Serafina Mitchell     Performed at [facility]   Report Status 06/30/2013 FINAL   Final   Organism ID, Bacteria STAPHYLOCOCCUS AUREUS   Final  MRSA PCR SCREENING     Status: None   Collection Time    06/26/13  8:08 AM      Result Value Range Status   MRSA by PCR NEGATIVE  NEGATIVE Final   Comment:            The GeneXpert MRSA Assay (FDA     approved for NASAL specimens     only), is one component of a     comprehensive MRSA colonization  surveillance program. It is not     intended to diagnose MRSA     infection nor to guide or     monitor treatment for     MRSA infections.  URINE CULTURE     Status: None   Collection Time    06/28/13  6:05 PM      Result Value Range Status   Specimen Description URINE, CATHETERIZED   Final   Special Requests Normal   Final   Culture  Setup Time     Final   Value: 06/28/2013 18:30     Performed at [facility] Count     Final   Value: NO GROWTH     Performed at [facility]   Culture     Final   Value: NO GROWTH     Performed at [facility]   Report Status 06/30/2013 FINAL   Final  CULTURE, BLOOD (ROUTINE X 2)     Status: None   Collection Time    06/29/13  3:10 PM      Result Value Range Status   Specimen Description BLOOD RIGHT ANTECUBITAL   Final   Special Requests BOTTLES DRAWN AEROBIC AND ANAEROBIC 10CC   Final   Culture  Setup Time     Final   Value: 06/29/2013 21:30     Performed at [facility]   Culture     Final    Value: NO GROWTH 5 DAYS     Performed at [facility]   Report Status 07/05/2013 FINAL   Final  CULTURE, BLOOD (ROUTINE X 2)     Status: None   Collection Time    06/29/13  3:20 PM      Result Value Range Status   Specimen Description BLOOD RIGHT HAND   Final   Special Requests BOTTLES DRAWN AEROBIC AND ANAEROBIC 10CC   Final   Culture  Setup Time     Final   Value: 06/29/2013 21:31     Performed at [facility]   Culture     Final   Value: NO GROWTH 5 DAYS     Performed at [facility]   Report Status 07/05/2013 FINAL   Final  URINE CULTURE     Status: None   Collection Time    07/11/13 12:46 PM      Result Value Range Status   Specimen Description URINE, CATHETERIZED   Final   Special Requests NONE   Final   Culture  Setup Time     Final   Value: 07/11/2013 13:36     Performed at [facility] Count     Final   Value: NO GROWTH     Performed at [facility]   Culture     Final   Value: NO GROWTH     Performed at [facility]   Report Status 07/12/2013 FINAL   Final  MRSA PCR SCREENING     Status: None   Collection Time    07/14/13  9:53 PM      Result Value Range Status   MRSA by PCR NEGATIVE  NEGATIVE Final   Comment:            The GeneXpert MRSA Assay (FDA     approved for NASAL specimens     only), is one component of a     comprehensive MRSA colonization     surveillance program.  It is not     intended to diagnose MRSA     infection nor to guide or     monitor treatment for     MRSA infections.    Assessment: 52yom started on cefazolin for MSSA bacteremia, then broadened to cover for HCAP, now to switch back to cefazolin to complete treatment. He was started on CRRT on 9/29 but is now off of it. Serum creatinine continues to trend up a little with CrCl 61ml/min.   Cefazolin 9/17>>9/25>>resume 10/3 through 10/10 Cefepime 9/26>>10/3 Levaquin PO 9/25>>9/27 Zosyn 9/25 x 1 dose Vanc 9/26 (on  rehab)>>10/3  Goal of Therapy:  Appropriate cefazolin dosing  Plan:  1) Cefazolin 2g IV q8 - ends 10/10. 2) Continue to follow renal function and adjust dose if necessary  Christoper Fabian, PharmD, BCPS Clinical pharmacist, pager [phone] 07/25/2013,11:48 AM

## 2013-07-25 NOTE — Progress Notes (Signed)
Rehab admissions - I am anticipating that patient might be ready for possible inpatient rehab admission tomorrow.  I have called [employer] and I am seeking approval for acute inpatient rehab admission.  I will follow up tomorrow for medical readiness.  Call me for questions.  #[phone]

## 2013-07-25 NOTE — Progress Notes (Signed)
Physical Therapy Treatment Patient Details Name: REID REGAS MRN: [id number] DOB: 04-30-1961 Today's Date: 07/25/2013 Time: 4098-1191 PT Time Calculation (min): 32 min  PT Assessment / Plan / Recommendation  History of Present Illness Mr. Quadros is a 51 year old male with past medical history significant for diabetes, CHF, and recent BKA for staph osteomyelitis, who is transferred from rehabilitation to the step down unit for hypotension & worsening hypoxemia.  Mr. Laban has been followed by cardiology since admission for three-vessel coronary artery disease which potentially require CABG.  This has been delayed to allow adequate treatment of his current infection.  Over the last 3 days he has had worsening hypotension and shortness of breath.  This was initially attributed to worsening heart failure and volume overload.  He has been diuresed with Lasix however remains volume overloaded.  On the night of September 26 he had a fall getting back into his bed.  At that time his wound dehisced and he developed bleeding from his wound requiring multiple dressing changes.  On the evening of 928 he had an episode of worsening shortness of breath after fluid bolus for hypotension.  Chest x-ray shows bilateral large pleural effusions as well as pulmonary edema   PT Comments   Pt making steady progress. Good rehab candidate.  Follow Up Recommendations  CIR     Does the patient have the potential to tolerate intense rehabilitation     Barriers to Discharge        Equipment Recommendations  Wheelchair (measurements PT);Wheelchair cushion (measurements PT);3in1 (PT);Rolling walker with 5" wheels    Recommendations for Other Services Rehab consult  Frequency Min 3X/week   Progress towards PT Goals Progress towards PT goals: Progressing toward goals  Plan Current plan remains appropriate    Precautions / Restrictions Precautions Precautions: Fall Precaution Comments: R BKA Restrictions Weight  Bearing Restrictions: Yes RLE Weight Bearing: Non weight bearing Other Position/Activity Restrictions: elevate operated limb, do not prop pillows under knee; try to position on LEFT side so that RIGHT hip can be in neutral/extension   Pertinent Vitals/Pain VSS    Mobility  Bed Mobility Rolling Left: 4: Min assist;With rail Supine to Sit: 1: +2 Total assist;With rails;HOB elevated Supine to Sit: Patient Percentage: 60% Sitting - Scoot to Edge of Bed: 3: Mod assist Sit to Supine: 3: Mod assist;HOB flat Details for Bed Mobility Assistance: Assist to bring trunk up and hips to EOB. Transfers Transfers: Sit to Stand;Stand to Sit Sit to Stand: 1: +2 Total assist Sit to Stand: Patient Percentage: 60% Stand to Sit: 1: +2 Total assist Stand to Sit: Patient Percentage: 60% Details for Transfer Assistance: Verbal cues for hand placement.  Assist to bring hips up.    Exercises Amputee Exercises Hip ABduction/ADduction: AAROM;Right;10 reps;Supine Hip Flexion/Marching: AROM;10 reps;Right;Supine Knee Flexion: AROM;Right;10 reps;Supine Knee Extension: AROM;Right;10 reps;Seated Straight Leg Raises: AROM;Right;10 reps;Supine   PT Diagnosis:    PT Problem List:   PT Treatment Interventions:     PT Goals (current goals can now be found in the care plan section)    Visit Information  Last PT Received On: 07/25/13 Assistance Needed: +2 History of Present Illness: Mr. Rinker is a 52 year old male with past medical history significant for diabetes, CHF, and recent BKA for staph osteomyelitis, who is transferred from rehabilitation to the step down unit for hypotension & worsening hypoxemia.  Mr. Kellogg has been followed by cardiology since admission for three-vessel coronary artery disease which potentially require CABG.  This has been delayed  to allow adequate treatment of his current infection.  Over the last 3 days he has had worsening hypotension and shortness of breath.  This was initially  attributed to worsening heart failure and volume overload.  He has been diuresed with Lasix however remains volume overloaded.  On the night of September 26 he had a fall getting back into his bed.  At that time his wound dehisced and he developed bleeding from his wound requiring multiple dressing changes.  On the evening of 928 he had an episode of worsening shortness of breath after fluid bolus for hypotension.  Chest x-ray shows bilateral large pleural effusions as well as pulmonary edema    Subjective Data      Cognition  Cognition Arousal/Alertness: Awake/alert Behavior During Therapy: Flat affect Overall Cognitive Status: Within Functional Limits for tasks assessed    Balance  Static Standing Balance Static Standing - Balance Support: Bilateral upper extremity supported Static Standing - Level of Assistance: 1: +2 Total assist (pt=80%) Static Standing - Comment/# of Minutes: Pt stood x 3 for 45-60 seconds with walker.  End of Session PT - End of Session Equipment Utilized During Treatment: Gait belt Activity Tolerance: Patient tolerated treatment well Patient left: in bed;with call bell/phone within reach Nurse Communication: Mobility status;Need for lift equipment   GP     Timothy Trudell 07/25/2013, 11:23 AM  [facility] PT [phone]

## 2013-07-25 NOTE — Progress Notes (Signed)
Subjective: No CP  No SOB Objective: Filed Vitals:   07/24/13 1639 07/24/13 1718 07/24/13 2105 07/25/13 0356  BP:  101/69 119/71 113/70  Pulse: 97 100 101 106  Temp:   98.5 F (36.9 C) 98.3 F (36.8 C)  TempSrc:   Oral Oral  Resp:      Height:      Weight:    257 lb 0.9 oz (116.6 kg)  SpO2:   95% 93%   Weight change: -11 lb 11 oz (-5.3 kg)  Intake/Output Summary (Last 24 hours) at 07/25/13 0803 Last data filed at 07/24/13 2034  Gross per 24 hour  Intake   1011 ml  Output   2925 ml  Net  -1914 ml   Net I/O since admit  Neg 22 L  General: Alert, awake, oriented x3, in no acute distress Neck:  JVP is difficult to assess Heart: Regular rate and rhythm, without murmurs, rubs, gallops.  Lungs: Clear to auscultation.  No rales or wheezes. Exemities: s/p BKA  Neuro: Grossly intact, nonfocal.  Tele:  SR/ST  Low 100s. Lab Results: Results for orders placed during the hospital encounter of 07/14/13 (from the past 24 hour(s))  GLUCOSE, CAPILLARY     Status: Abnormal   Collection Time    07/24/13 11:53 AM      Result Value Range   Glucose-Capillary 150 (*) 70 - 99 mg/dL  GLUCOSE, CAPILLARY     Status: Abnormal   Collection Time    07/24/13  4:49 PM      Result Value Range   Glucose-Capillary 179 (*) 70 - 99 mg/dL   Comment 1 Notify RN    GLUCOSE, CAPILLARY     Status: Abnormal   Collection Time    07/24/13  9:03 PM      Result Value Range   Glucose-Capillary 150 (*) 70 - 99 mg/dL   Comment 1 Notify RN    CBC     Status: Abnormal   Collection Time    07/25/13  5:30 AM      Result Value Range   WBC 5.8  4.0 - 10.5 K/uL   RBC 2.93 (*) 4.22 - 5.81 MIL/uL   Hemoglobin 7.7 (*) 13.0 - 17.0 g/dL   HCT 40.9 (*) 81.1 - 91.4 %   MCV 85.3  78.0 - 100.0 fL   MCH 26.3  26.0 - 34.0 pg   MCHC 30.8  30.0 - 36.0 g/dL   RDW 78.2 (*) 95.6 - 21.3 %   Platelets 183  150 - 400 K/uL    Studies/Results: @RISRSLT24 @  Medications: Reviewed   @PROBHOSP @  1.  NSTEMI  Patient with  3V CAD by cath 06/28/13.  Plan for revasc when recovered.  3.  Acuted systolic CHF  LVEF 40 to 45%  Is down 22 L  I would continue lasix but pull back to every 12 hours  Check BMET and BNP in AM  4  Anemia  Needs to be followed  No futher dip..    5.  ARF  Cr is slightly increased  Cut back on lasix frequency  Follow  Labs in AM.  6  MSSA bacteremia  LOS: 11 days   Albert [name] 07/25/2013, 8:03 AM

## 2013-07-25 NOTE — Progress Notes (Signed)
Patient ID: TADAO EMIG  male  ZOX:[id number]    DOB: 07-Sep-1961    DOA: 07/14/2013  PCP: Rollene Rotunda, MD  Assessment/Plan:  Recent sepsis/MSSA bacteremia from gangrenous R foot s/p R BKA 06/29/13  - admit 9/9->9/17->CIR - TEE negative for bacteremia 07/03/13  - Currently on Ancef, to complete abx course 10/10    3-vessel CAD by cath 06/28/13 now with 2nd NSTEMI  - inital plan was for CABG once recovered from BKA - Cardiology following   Ischemic cardiomyopathy with acute systolic CHF  EF 81% by TTE 9/10 - 45-50% by TEE 9/17 - Cardiology following - remains volume overloaded, placed on Lasix per cardiology , on aspirin, Coreg  Acute kidney injury  Nephrology has signed off - required transient CVVHD - previously normal renal function. Creatinine function worsens to 1.7 today  Acute blood loss anemia - bleeding from surgical site s/p fall  Hemoglobin in 7.6- 7.7 range during this hospitalization, continue to monitor for now, continue to avoid anticoag for now  Acute hypoxic respiratory failure secondary to anemia and volume overload  Resolved - sats in mid 90s on RA   B Pleural effusions  F/u CXR suggests stability - clinically stabilizing   RUE edema  Persists but improving - no DVT on doppler - elevate   Scrotal edema  Cont Foley - elvevate scrotum on towel - slightly improved   Sinus tachycardia  Persists but improving - Cardiology adjusting beta blocker   Hypotension  Resolved   Transaminitis  shock liver - resolved   Hyperkalemia  Resolved  Diabetes mellitus  CBG stable at this time  Moderate mitral regurgitation  by TEE 07/03/13   DVT Prophylaxis:  Code Status:  Disposition: CIR consult placed, most likely disposition tomorrow    Subjective: No specific complaints at this time, patient eating at the time of my encounter  Objective: Weight change: -5.3 kg (-11 lb 11 oz)  Intake/Output Summary (Last 24 hours) at 07/25/13 1300 Last data filed at  07/24/13 2034  Gross per 24 hour  Intake    415 ml  Output   1800 ml  Net  -1385 ml   Blood pressure 101/70, pulse 102, temperature 98.3 F (36.8 C), temperature source Oral, resp. rate 18, height 5\' 10"  (1.778 m), weight 116.6 kg (257 lb 0.9 oz), SpO2 93.00%.  Physical Exam: General: Alert and awake, oriented x3, not in any acute distress. HEENT right neck dialysis catheter CVS: Tachycardia, regular Chest: Decreased breath sounds at the bases Abdomen: soft nontender, nondistended, normal bowel sounds  Extremities: Bilateral lower extremity wounds, diffuse edema, scrotal edema  Lab Results: Basic Metabolic Panel:  Recent Labs Lab 07/19/13 0445  07/22/13 0500  07/24/13 0530 07/25/13 1005  NA 131*  < > 138  < > 143 143  K 4.8  < > 4.4  < > 3.7 3.6  CL 97  < > 101  < > 102 100  CO2 25  < > 27  < > 33* 33*  GLUCOSE 151*  < > 157*  < > 84 192*  BUN 51*  < > 63*  < > 54* 49*  CREATININE 1.83*  < > 1.71*  < > 1.54* 1.74*  CALCIUM 7.3*  < > 7.8*  < > 8.0* 8.3*  MG 2.3  --   --   --   --   --   PHOS 3.7  --  4.7*  --   --   --   < > =  values in this interval not displayed. Liver Function Tests:  Recent Labs Lab 07/21/13 0500 07/22/13 0500 07/23/13 0500  AST 32  --  24  ALT 87*  --  19  ALKPHOS 83  --  68  BILITOT 0.5  --  0.4  PROT 6.1  --  5.5*  ALBUMIN 2.6* 2.5* 2.4*   No results found for this basename: LIPASE, AMYLASE,  in the last 168 hours No results found for this basename: AMMONIA,  in the last 168 hours CBC:  Recent Labs Lab 07/23/13 0500 07/25/13 0530  WBC 7.1 5.8  HGB 7.6* 7.7*  HCT 24.2* 25.0*  MCV 85.2 85.3  PLT 162 183   Cardiac Enzymes: No results found for this basename: CKTOTAL, CKMB, CKMBINDEX, TROPONINI,  in the last 168 hours BNP: No components found with this basename: POCBNP,  CBG:  Recent Labs Lab 07/24/13 1153 07/24/13 1649 07/24/13 2103 07/25/13 0758 07/25/13 1136  GLUCAP 150* 179* 150* 146* 141*     Micro Results: No  results found for this or any previous visit (from the past 240 hour(s)).  Studies/Results: Dg Chest 2 View  07/12/2013   CLINICAL DATA:  Airspace opacities in the lungs and pleural effusions.  EXAM: CHEST  2 VIEW  COMPARISON:  07/11/2013  FINDINGS: Left central line tip projects over the lower SVC.  Mildly increased airspace opacity in the right middle lobe. Otherwise the bilateral perihilar airspace opacities appear stable.  Cardiothoracic index 57%. Moderate pleural effusions on the lateral projection.  IMPRESSION: 1. Moderate bilateral pleural effusions. Perihilar airspace opacities with mild cardiomegaly favoring pulmonary edema over atypical pneumonia.   Electronically Signed   By: Herbie Baltimore   On: 07/12/2013 07:43   Dg Chest 2 View  07/11/2013   CLINICAL DATA:  Shortness breath. Cough. Diabetic and hypertensive patient.  EXAM: CHEST  2 VIEW  COMPARISON:  06/27/2013.  FINDINGS: Left PICC line tip distal superior vena cava level just above the cavoatrial junction.  Progressive diffuse asymmetric airspace disease with bilateral pleural effusions. Although pulmonary edema may explain this appearance, the patchy consolidation raises possibility of superimposed infection. Clinical correlation recommended. Followup until clearance recommended.  No gross pneumothorax.  Cardiomegaly.  IMPRESSION: Progressive diffuse asymmetric airspace disease with bilateral pleural effusions. Although pulmonary edema may explain this appearance, the patchy consolidation raises possibility of superimposed infection. Clinical correlation recommended.  Please see above.  These results will be called to the ordering clinician or representative by the Radiologist Assistant, and communication documented in the PACS Dashboard.   Electronically Signed   By: Bridgett Larsson   On: 07/11/2013 08:58   Dg Chest Port 1 View  07/21/2013   CLINICAL DATA:  Edema and short of breath  EXAM: PORTABLE CHEST - 1 VIEW  COMPARISON:  Chest  radiograph 07/17/2013  FINDINGS: Right central venous line is unchanged. Left PICC line is unchanged. Stable enlarged cardiac silhouette. There is bibasilar atelectasis similar to prior. There is some improvement in perihilar airspace disease seen on prior. No pneumothorax.  IMPRESSION: 1. Some mild improvement perihilar airspace disease.  2.  Persistent dense bibasilar atelectasis.   Electronically Signed   By: Genevive Bi M.D.   On: 07/21/2013 07:40   Dg Chest Port 1 View  07/17/2013   *RADIOLOGY REPORT*  Clinical Data: Respiratory distress, recent right-sided below-knee amputation, subsequent encounter.  PORTABLE CHEST - 1 VIEW  Comparison: 07/15/2013; 07/14/2013; 07/12/2013  Findings:  The examination is degraded due to positioning and patient body habitus.  Grossly unchanged cardiac silhouette and mediastinal contours. Stable positioning of support apparatus. No pneumothorax.  The pulmonary vasculature remains indistinct with cephalization of flow. Grossly unchanged small bilateral pleural effusions, right greater than left with associated bilateral mid and lower lung heterogeneous / consolidative opacities.  Unchanged bones.  IMPRESSION: Degraded examination with persistent findings of suspected alveolar pulmonary edema, small bilateral effusions and associated bibasilar opacities, atelectasis versus infiltrate.   Original Report Authenticated By: Tacey Ruiz, MD   Dg Chest Port 1 View  07/15/2013   CLINICAL DATA:  Pulmonary edema.  IJ catheter insertion.  EXAM: PORTABLE CHEST - 1 VIEW  COMPARISON:  Chest x-ray dated 07/14/2013  FINDINGS: Right IJ catheter has been inserted and the tip is at the superior vena cava adjacent to the azygos vein. No pneumothorax. Improved bilateral pulmonary edema and pleural effusions. Heart size and vascularity are now normal.  IMPRESSION: IJ catheter tip at the level of the azygos vein in the superior vena cava. Improving pulmonary edema and effusions.    Electronically Signed   By: Geanie Cooley   On: 07/15/2013 17:53   Dg Chest Port 1 View  07/14/2013   CLINICAL DATA:  Hypoxemia, low blood pressure, respiratory distress.  EXAM: PORTABLE CHEST - 1 VIEW  COMPARISON:  07/12/2013  FINDINGS: Diffuse perihilar and scratch head diffuse bilateral airspace opacities compatible with edema. Heart is borderline in size. Suspect layering effusions. No acute bony abnormality.  IMPRESSION: Moderate pulmonary edema and layering effusions.   Electronically Signed   By: Charlett Nose M.D.   On: 07/14/2013 22:34   Dg Chest Port 1 View  06/27/2013   *RADIOLOGY REPORT*  Clinical Data: Shortness of breath, evaluate endotracheal tube position  PORTABLE CHEST - 1 VIEW  Comparison: Chest x-ray of 01/15/2013  Findings: No endotracheal tube is visible.  The lungs are not as well aerated and may be mild pulmonary vascular congestion present with some basilar volume loss.  The heart is within upper limits of normal.  No bony abnormality is seen.  IMPRESSION: Diminished aeration.  Question mild pulmonary vascular congestion.   Original Report Authenticated By: Dwyane Dee, M.D.    Medications: Scheduled Meds: . aspirin  81 mg Oral Daily  . carvedilol  25 mg Oral BID WC  .  ceFAZolin (ANCEF) IV  2 g Intravenous Q8H  . famotidine  20 mg Oral BID  . furosemide  160 mg Intravenous Q6H  . hydrALAZINE  25 mg Oral Q6H  . insulin aspart  0-15 Units Subcutaneous TID WC  . insulin detemir  23 Units Subcutaneous QHS  . polyethylene glycol  17 g Oral Daily  . senna-docusate  1 tablet Oral BID  . sodium chloride  3 mL Intravenous Q12H      LOS: 11 days   Cira Deyoe M.D. [facility] 07/25/2013, 1:00 PM Pager: [phone]  If 7PM-7AM, please contact night-coverage [email or web address] Password [email or web address]

## 2013-07-26 ENCOUNTER — Inpatient Hospital Stay (HOSPITAL_COMMUNITY)
Admission: RE | Admit: 2013-07-26 | Discharge: 2013-08-08 | DRG: 945 | Payer: 59 | Source: Intra-hospital | Attending: Physical Medicine & Rehabilitation | Admitting: Physical Medicine & Rehabilitation

## 2013-07-26 DIAGNOSIS — Z89511 Acquired absence of right leg below knee: Secondary | ICD-10-CM

## 2013-07-26 DIAGNOSIS — I5021 Acute systolic (congestive) heart failure: Secondary | ICD-10-CM

## 2013-07-26 DIAGNOSIS — I70269 Atherosclerosis of native arteries of extremities with gangrene, unspecified extremity: Secondary | ICD-10-CM | POA: Diagnosis present

## 2013-07-26 DIAGNOSIS — A419 Sepsis, unspecified organism: Secondary | ICD-10-CM | POA: Diagnosis present

## 2013-07-26 DIAGNOSIS — S88119A Complete traumatic amputation at level between knee and ankle, unspecified lower leg, initial encounter: Secondary | ICD-10-CM

## 2013-07-26 DIAGNOSIS — Y835 Amputation of limb(s) as the cause of abnormal reaction of the patient, or of later complication, without mention of misadventure at the time of the procedure: Secondary | ICD-10-CM | POA: Diagnosis present

## 2013-07-26 DIAGNOSIS — I509 Heart failure, unspecified: Secondary | ICD-10-CM

## 2013-07-26 DIAGNOSIS — D62 Acute posthemorrhagic anemia: Secondary | ICD-10-CM | POA: Diagnosis present

## 2013-07-26 DIAGNOSIS — I255 Ischemic cardiomyopathy: Secondary | ICD-10-CM

## 2013-07-26 DIAGNOSIS — Z5189 Encounter for other specified aftercare: Principal | ICD-10-CM

## 2013-07-26 DIAGNOSIS — F411 Generalized anxiety disorder: Secondary | ICD-10-CM | POA: Diagnosis not present

## 2013-07-26 DIAGNOSIS — I252 Old myocardial infarction: Secondary | ICD-10-CM

## 2013-07-26 DIAGNOSIS — J9600 Acute respiratory failure, unspecified whether with hypoxia or hypercapnia: Secondary | ICD-10-CM

## 2013-07-26 DIAGNOSIS — N179 Acute kidney failure, unspecified: Secondary | ICD-10-CM

## 2013-07-26 DIAGNOSIS — E1142 Type 2 diabetes mellitus with diabetic polyneuropathy: Secondary | ICD-10-CM | POA: Diagnosis present

## 2013-07-26 DIAGNOSIS — L98499 Non-pressure chronic ulcer of skin of other sites with unspecified severity: Secondary | ICD-10-CM

## 2013-07-26 DIAGNOSIS — T8789 Other complications of amputation stump: Secondary | ICD-10-CM | POA: Diagnosis present

## 2013-07-26 DIAGNOSIS — J810 Acute pulmonary edema: Secondary | ICD-10-CM

## 2013-07-26 DIAGNOSIS — I6529 Occlusion and stenosis of unspecified carotid artery: Secondary | ICD-10-CM | POA: Diagnosis present

## 2013-07-26 DIAGNOSIS — I739 Peripheral vascular disease, unspecified: Secondary | ICD-10-CM

## 2013-07-26 DIAGNOSIS — S98139A Complete traumatic amputation of one unspecified lesser toe, initial encounter: Secondary | ICD-10-CM

## 2013-07-26 DIAGNOSIS — R29898 Other symptoms and signs involving the musculoskeletal system: Secondary | ICD-10-CM | POA: Diagnosis present

## 2013-07-26 DIAGNOSIS — Z794 Long term (current) use of insulin: Secondary | ICD-10-CM

## 2013-07-26 DIAGNOSIS — I2589 Other forms of chronic ischemic heart disease: Secondary | ICD-10-CM | POA: Diagnosis present

## 2013-07-26 DIAGNOSIS — I5041 Acute combined systolic (congestive) and diastolic (congestive) heart failure: Secondary | ICD-10-CM | POA: Diagnosis present

## 2013-07-26 DIAGNOSIS — E1149 Type 2 diabetes mellitus with other diabetic neurological complication: Secondary | ICD-10-CM | POA: Diagnosis present

## 2013-07-26 DIAGNOSIS — IMO0002 Reserved for concepts with insufficient information to code with codable children: Secondary | ICD-10-CM

## 2013-07-26 DIAGNOSIS — A4901 Methicillin susceptible Staphylococcus aureus infection, unspecified site: Secondary | ICD-10-CM | POA: Diagnosis present

## 2013-07-26 DIAGNOSIS — R339 Retention of urine, unspecified: Secondary | ICD-10-CM | POA: Diagnosis not present

## 2013-07-26 DIAGNOSIS — I10 Essential (primary) hypertension: Secondary | ICD-10-CM

## 2013-07-26 DIAGNOSIS — I2510 Atherosclerotic heart disease of native coronary artery without angina pectoris: Secondary | ICD-10-CM

## 2013-07-26 DIAGNOSIS — I214 Non-ST elevation (NSTEMI) myocardial infarction: Secondary | ICD-10-CM

## 2013-07-26 DIAGNOSIS — E1165 Type 2 diabetes mellitus with hyperglycemia: Secondary | ICD-10-CM

## 2013-07-26 DIAGNOSIS — Z7982 Long term (current) use of aspirin: Secondary | ICD-10-CM

## 2013-07-26 DIAGNOSIS — I69998 Other sequelae following unspecified cerebrovascular disease: Secondary | ICD-10-CM

## 2013-07-26 LAB — CBC
Hemoglobin: 7.4 g/dL — ABNORMAL LOW (ref 13.0–17.0)
MCHC: 31.1 g/dL (ref 30.0–36.0)
Platelets: 178 10*3/uL (ref 150–400)
RBC: 2.81 MIL/uL — ABNORMAL LOW (ref 4.22–5.81)

## 2013-07-26 LAB — BASIC METABOLIC PANEL
GFR calc Af Amer: 57 mL/min — ABNORMAL LOW (ref >90)
GFR calc non Af Amer: 49 mL/min — ABNORMAL LOW (ref >90)
Potassium: 3.4 mEq/L — ABNORMAL LOW (ref 3.5–5.1)
Sodium: 142 mEq/L (ref 135–145)

## 2013-07-26 LAB — GLUCOSE, CAPILLARY
Glucose-Capillary: 95 mg/dL (ref 70–99)
Glucose-Capillary: 131 mg/dL — ABNORMAL HIGH (ref 70–99)
Glucose-Capillary: 135 mg/dL — ABNORMAL HIGH (ref 70–99)
Glucose-Capillary: 130 mg/dL — ABNORMAL HIGH (ref 70–99)

## 2013-07-26 MED ORDER — ALPRAZOLAM 0.25 MG PO TABS
0.2500 mg | ORAL_TABLET | Freq: Two times a day (BID) | ORAL | Status: DC | PRN
  Administered 2013-07-26 – 2013-08-07 (×7): 0.25 mg via ORAL
  Filled 2013-07-26 (×7): qty 1

## 2013-07-26 MED ORDER — POTASSIUM CHLORIDE CRYS ER 20 MEQ PO TBCR: 20.0000 meq | EXTENDED_RELEASE_TABLET | Freq: Two times a day (BID) | ORAL | Status: DC

## 2013-07-26 MED ORDER — ONDANSETRON HCL 4 MG PO TABS
4.0000 mg | ORAL_TABLET | Freq: Four times a day (QID) | ORAL | Status: DC | PRN
  Administered 2013-08-01: 4 mg via ORAL
  Filled 2013-07-26: qty 1

## 2013-07-26 MED ORDER — GUAIFENESIN-DM 100-10 MG/5ML PO SYRP: 5.0000 mL | ORAL_SOLUTION | ORAL | Status: DC | PRN

## 2013-07-26 MED ORDER — ZOLPIDEM TARTRATE 5 MG PO TABS
5.0000 mg | ORAL_TABLET | Freq: Every evening | ORAL | Status: DC | PRN
  Administered 2013-07-26 – 2013-08-07 (×11): 5 mg via ORAL
  Filled 2013-07-26 (×13): qty 1

## 2013-07-26 MED ORDER — FUROSEMIDE 10 MG/ML IJ SOLN: 160.0000 mg | Freq: Two times a day (BID) | INTRAVENOUS | Status: DC

## 2013-07-26 MED ORDER — ASPIRIN 81 MG PO CHEW
81.0000 mg | CHEWABLE_TABLET | Freq: Every day | ORAL | Status: DC
Start: 2013-07-27 — End: 2013-08-08
  Administered 2013-07-27 – 2013-08-08 (×13): 81 mg via ORAL
  Filled 2013-07-26 (×13): qty 1

## 2013-07-26 MED ORDER — SORBITOL 70 % SOLN
30.0000 mL | Freq: Every day | Status: DC | PRN
  Administered 2013-07-31 – 2013-08-04 (×2): 30 mL via ORAL
  Filled 2013-07-26 (×2): qty 30

## 2013-07-26 MED ORDER — SODIUM CHLORIDE 0.9 % IJ SOLN
10.0000 mL | INTRAMUSCULAR | Status: DC | PRN
  Administered 2013-07-26: 10 mL

## 2013-07-26 MED ORDER — FAMOTIDINE 20 MG PO TABS
20.0000 mg | ORAL_TABLET | Freq: Two times a day (BID) | ORAL | Status: DC
  Administered 2013-07-26 – 2013-08-08 (×26): 20 mg via ORAL
  Filled 2013-07-26 (×29): qty 1

## 2013-07-26 MED ORDER — CEFAZOLIN SODIUM-DEXTROSE 2-3 GM-% IV SOLR: 2.0000 g | Freq: Three times a day (TID) | INTRAVENOUS | Status: DC

## 2013-07-26 MED ORDER — SENNOSIDES-DOCUSATE SODIUM 8.6-50 MG PO TABS
1.0000 | ORAL_TABLET | Freq: Two times a day (BID) | ORAL | Status: DC
  Administered 2013-07-26 – 2013-08-08 (×26): 1 via ORAL
  Filled 2013-07-26 (×26): qty 1

## 2013-07-26 MED ORDER — FUROSEMIDE 10 MG/ML IJ SOLN: 60.0000 mg | Freq: Two times a day (BID) | INTRAMUSCULAR | Status: DC

## 2013-07-26 MED ORDER — POTASSIUM CHLORIDE CRYS ER 20 MEQ PO TBCR
40.0000 meq | EXTENDED_RELEASE_TABLET | Freq: Once | ORAL | Status: AC
  Administered 2013-07-26: 40 meq via ORAL
  Filled 2013-07-26: qty 2

## 2013-07-26 MED ORDER — POLYETHYLENE GLYCOL 3350 17 G PO PACK
17.0000 g | PACK | Freq: Every day | ORAL | Status: DC
  Administered 2013-07-27 – 2013-08-07 (×11): 17 g via ORAL
  Filled 2013-07-26 (×15): qty 1

## 2013-07-26 MED ORDER — ACETAMINOPHEN 325 MG PO TABS
325.0000 mg | ORAL_TABLET | ORAL | Status: DC | PRN
  Administered 2013-07-28 – 2013-08-04 (×6): 650 mg via ORAL
  Filled 2013-07-26 (×6): qty 2

## 2013-07-26 MED ORDER — SODIUM CHLORIDE 0.9 % IJ SOLN: 10.0000 mL | INTRAMUSCULAR | Status: DC | PRN

## 2013-07-26 MED ORDER — INSULIN DETEMIR 100 UNIT/ML ~~LOC~~ SOLN
23.0000 [IU] | Freq: Every day | SUBCUTANEOUS | Status: DC
  Administered 2013-07-26 – 2013-08-07 (×13): 23 [IU] via SUBCUTANEOUS
  Filled 2013-07-26 (×14): qty 0.23

## 2013-07-26 MED ORDER — GUAIFENESIN-DM 100-10 MG/5ML PO SYRP
10.0000 mL | ORAL_SOLUTION | ORAL | Status: DC | PRN
  Administered 2013-07-26 – 2013-07-28 (×3): 10 mL via ORAL
  Filled 2013-07-26 (×4): qty 10

## 2013-07-26 MED ORDER — ONDANSETRON HCL 4 MG/2ML IJ SOLN
4.0000 mg | Freq: Four times a day (QID) | INTRAMUSCULAR | Status: DC | PRN
  Administered 2013-07-27: 4 mg via INTRAVENOUS
  Filled 2013-07-26: qty 2

## 2013-07-26 MED ORDER — INSULIN ASPART 100 UNIT/ML ~~LOC~~ SOLN
0.0000 [IU] | Freq: Three times a day (TID) | SUBCUTANEOUS | Status: DC
  Administered 2013-07-26 – 2013-07-27 (×3): 2 [IU] via SUBCUTANEOUS
  Administered 2013-07-27: 3 [IU] via SUBCUTANEOUS
  Administered 2013-07-28 (×2): 2 [IU] via SUBCUTANEOUS
  Administered 2013-07-29: 3 [IU] via SUBCUTANEOUS
  Administered 2013-07-30 – 2013-07-31 (×3): 2 [IU] via SUBCUTANEOUS
  Administered 2013-08-01: 3 [IU] via SUBCUTANEOUS
  Administered 2013-08-02: 2 [IU] via SUBCUTANEOUS
  Administered 2013-08-03: 3 [IU] via SUBCUTANEOUS
  Administered 2013-08-03 – 2013-08-04 (×3): 5 [IU] via SUBCUTANEOUS
  Administered 2013-08-04: 2 [IU] via SUBCUTANEOUS
  Administered 2013-08-05: 3 [IU] via SUBCUTANEOUS
  Administered 2013-08-05: 2 [IU] via SUBCUTANEOUS
  Administered 2013-08-06: 3 [IU] via SUBCUTANEOUS
  Administered 2013-08-07 – 2013-08-08 (×2): 2 [IU] via SUBCUTANEOUS

## 2013-07-26 MED ORDER — CARVEDILOL 25 MG PO TABS
25.0000 mg | ORAL_TABLET | Freq: Two times a day (BID) | ORAL | Status: DC
  Administered 2013-07-26 – 2013-08-08 (×26): 25 mg via ORAL
  Filled 2013-07-26 (×30): qty 1

## 2013-07-26 NOTE — H&P (Signed)
Physical Medicine and Rehabilitation Admission H&P  No chief complaint on file.  :  HPI: Albert [name] is a 52 y.o. 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 Admitted 06/25/2013 with nonhealing ulcer to the right foot and Charcot collapse right foot. Underwent irrigation and debridement of right foot plantar abscess 06/25/2013 per Dr. Magnus Ivan. Postoperatively with hypotension and tachycardia. Cardiology services consulted with findings of elevated troponin levels of 10. Echocardiogram with distal septal hypokinesis with overall low-normal LVEF. Elevated troponin levels are likely related to underlying sepsis. Underwent cardiac catheterization 06/28/2013 with findings of triple vessel CAD NSTEMI secondary to demand ischemia and mild LV systolic dysfunction. Patient placed on medical management with aspirin therapy. Patient with ongoing ischemic changes right lower extremity and underwent right below-knee amputation 06/29/2013 per Dr. Lajoyce Corners. Cardiothoracic surgery followup to consider CABG after recovery from BKA. TEE completed 07/03/2013 to rule out any vegetation that was negative with ejection fraction 45-50% .patient currently remains on Ancef therapy per infectious disease for sepsis /MSSA bacteremia with plan for 28 day duration to be completed 07/26/2013. Physical therapy evaluation completed 06/30/2013 with recommendations of physical medicine rehabilitation consult to consider inpatient rehabilitation services. Patient was admitted inpatient rehabilitation services 07/03/2013. Albert [name] was participating with therapies and noted 07/11/2013 with increased shortness of breath and increased weight gain of 10 pounds. He received intravenous Lasix with improvement in shortness of breath. He  subsequently started on cefepime and vancomycin for concern of HCAP per critical care medicine. He developed hypotension 85/57 and was given normal saline 250 cc bolus. Patient also with a fall transferring from bed to bedside commode 07/11/2013. Noted bleeding from right BKA site with increased need for dressing changes and 2 staples in the lateral aspect of the incision had partially dehisced. Followup orthopedic services and no further surgical intervention required by continue dressing changes and monitor. Due to ongoing bouts of pulmonary edema as well his recent wound dehiscence patient was discharged to acute care services 07/14/2013 for medical management. Followup EKG showed sinus tachycardia nonspecific ST-T changes followup cardiology services and advised to continue to cycle troponin. A followup echocardiogram 07/15/2013 showed ejection fraction of 45% with decreased left ventricular diastolic function. Advised to continue medical management with aspirin therapy. Still plan remains for CABG in the future. Hospital acute stay complicated by hyperkalemia as well as mild elevation in creatinine 1.78-2.8 and renal services consulted felt to be secondary to ischemic ATN and reason intravenous Lasix. His renal function normalized 1.5 and monitored. Wound care followup for nonhealing wound left foot from previous amputation of multiple toes in February 2014 and dressing changes as advised. Patient was some increased swelling right upper extremity venous Doppler study showed no signs of DVT. Albert [name] therapies have been resumed and patient was felt to be a good candidate for inpatient rehabilitation services was admitted for comprehensive rehabilitation program today  ROS Review of Systems  Constitutional: Positive for fever.  Respiratory: Positive for cough.  Cardiovascular: Positive for leg swelling.  Neurological: Positive for weakness.  All other systems reviewed and are negative  Past Medical  History   Diagnosis  Date   .  Hypertension    .  Diabetes mellitus without complication  ~2000     last HbA1c ~9   .  Hypercholesteremia    .  CVA (cerebral infarction)  2011     R hand deficit   .  Chronic sinusitis    .  Allergic rhinitis    .  Chronic cough    .  Complication of anesthesia      couldn't swallow and talk   .  Stroke     Past Surgical History   Procedure  Laterality  Date   .  4th toe amputation  Left  Jan. 2014     4th toe in [place]   .  Tonsillectomy and adenoidectomy     .  Penile prosthesis implant     .  I&d extremity  Left  12/08/2012     Procedure: IRRIGATION AND DEBRIDEMENT EXTREMITY; Surgeon: Kathryne Hitch, MD; Location: [facility] OR; Service: Orthopedics; Laterality: Left;   .  Amputation  Left  12/08/2012     Procedure: AMPUTATION DIGIT; Surgeon: Kathryne Hitch, MD; Location: [facility] OR; Service: Orthopedics; Laterality: Left; 3rd toe amputation possible 5th toe amputation   .  I&d extremity  Left  12/11/2012     Procedure: REPEAT I&D LEFT FOOT; Surgeon: Kathryne Hitch, MD; Location: [facility] OR; Service: Orthopedics; Laterality: Left;   .  Amputation  Left  12/11/2012     Procedure: Amputation of 2nd Toe; Surgeon: Kathryne Hitch, MD; Location: [facility] OR; Service: Orthopedics; Laterality: Left;   .  Bypass graft popliteal to tibial  Left  12/13/2012     Procedure: BYPASS GRAFT POPLITEAL TO TIBIAL; Surgeon: Nada Libman, MD; Location: [facility] OR; Service: Vascular; Laterality: Left; Left Popliteal to Posterior Tibial Bypass Graft with reversed saphenous vein graft   .  Incision and drainage  Right  06/25/2013     Procedure: INCISION AND DRAINAGE RIGHT FOOT; Surgeon: Kathryne Hitch, MD; Location: [facility] ORS; Service: Orthopedics; Laterality: Right;   .  Amputation  Right  06/29/2013     Procedure: AMPUTATION BELOW KNEE; Surgeon: Nadara Mustard, MD; Location: [facility] OR; Service: Orthopedics; Laterality: Right;   .  Tee without cardioversion  [name]  07/03/2013      Procedure: TRANSESOPHAGEAL ECHOCARDIOGRAM (TEE); Surgeon: Laurey Morale, MD; Location: [facility] ENDOSCOPY; Service: Cardiovascular; Laterality: [name];    Family History   Problem  Relation  Age of Onset   .  Diabetes     .  Pancreatic cancer  Mother    .  Diabetes  Mother    .  Hyperlipidemia  Mother    .  Breast cancer  Sister    .  Depression  Maternal Grandmother    .  Heart disease  Maternal Grandmother    .  Depression  Maternal Grandfather    .  Heart disease  Maternal Grandfather    .  Depression  Paternal Grandmother    .  Heart disease  Paternal Grandmother    .  Depression  Paternal Grandfather    .  Heart disease  Paternal Grandfather     Social History: reports that he has never smoked. He has never used smokeless tobacco. He reports that he does not drink alcohol or use illicit drugs.  Allergies:  Allergies   Allergen  Reactions   .  Amoxicillin  Nausea And Vomiting    Medications Prior to Admission   Medication  Sig  Dispense  Refill   .  aspirin EC 81 MG tablet  Take 81 mg by mouth daily.     .  [EXPIRED] ceFAZolin (ANCEF) 2-3 GM-% SOLR  Inject 50 mLs (2 g total) into  the vein every 8 (eight) hours.  50 mL  0   .  docusate sodium 100 MG CAPS  Take 100 mg by mouth 2 (two) times daily.  10 capsule  0   .  fentaNYL (SUBLIMAZE) 0.05 MG/ML injection  Inject 1-2 mLs (50-100 mcg total) into the vein every 2 (two) hours as needed for severe pain (or RASS > 0).  2 mL  0   .  glyBURIDE-metformin (GLUCOVANCE) 5-500 MG per tablet  Take 2 tablets by mouth 2 (two) times daily.     .  insulin aspart (NOVOLOG FLEXPEN) 100 UNIT/ML injection  Inject 12 Units into the skin 3 (three) times daily. Take every time he eats per patient     .  insulin detemir (LEVEMIR) 100 UNIT/ML injection  Inject 0.25 mLs (25 Units total) into the skin every evening.  10 mL  0   .  irbesartan-hydrochlorothiazide (AVALIDE) 300-12.5 MG per tablet  Take 1 tablet by mouth daily.     Marland Kitchen  lisinopril  (PRINIVIL,ZESTRIL) 5 MG tablet  Take 1 tablet (5 mg total) by mouth daily.     .  metoCLOPramide (REGLAN) 5 MG tablet  Take 1-2 tablets (5-10 mg total) by mouth every 8 (eight) hours as needed (if ondansetron (ZOFRAN) ineffective.).     Marland Kitchen  metoprolol (LOPRESSOR) 50 MG tablet  Take 1 tablet (50 mg total) by mouth 2 (two) times daily.     .  ondansetron (ZOFRAN) 8 MG tablet  Take by mouth every 8 (eight) hours as needed for nausea. For upset stomach     .  oxyCODONE (OXY IR/ROXICODONE) 5 MG immediate release tablet  Take 1 tablet (5 mg total) by mouth every 4 (four) hours as needed.  30 tablet  0   .  pantoprazole (PROTONIX) 40 MG tablet  Take 1 tablet (40 mg total) by mouth daily.     .  pregabalin (LYRICA) 75 MG capsule  Take 75 mg by mouth 2 (two) times daily.     .  simvastatin (ZOCOR) 40 MG tablet  Take 40 mg by mouth every evening.      Home:  Home Living  Family/patient expects to be discharged to:: Private residence  Living Arrangements: Spouse/significant other  Available Help at Discharge: Available PRN/intermittently  Type of Home: House  Home Access: Level entry  Entrance Stairs-Number of Steps: One step from garage into car port, then level entry into home  Home Layout: Two level  Alternate Level Stairs-Number of Steps: 1 step between living areas  Alternate Level Stairs-Rails: None  Home Equipment: Walker - 2 wheels;Wheelchair - manual  Additional Comments: Patient owns RW, shower chair, and wheelchair.  Lives With: Spouse;Family  Functional History:   Functional Status:  Mobility:  Bed Mobility  Bed Mobility: Rolling Right;Rolling Left  Rolling Right: 4: Min guard;With rail  Rolling Left: 4: Min guard;With rail  Right Sidelying to Sit: HOB elevated;With rails;1: +2 Total assist  Right Sidelying to Sit: Patient Percentage: 50%  Supine to Sit: 1: +2 Total assist;With rails;HOB elevated  Supine to Sit: Patient Percentage: 60%  Sitting - Scoot to Edge of Bed: 3: Mod assist   Sit to Supine: 2: Max assist;HOB flat  Transfers  Transfers: Lateral/Scoot Transfers  Sit to Stand: Not tested (comment)  Sit to Stand: Patient Percentage: 30%  Stand to Sit: Not tested (comment)  Stand to Sit: Patient Percentage: 40%  Squat Pivot Transfers: Not tested (comment)  Lateral/Scoot Transfers: 1: +2 Total  assist;With armrests removed;From elevated surface  Lateral Transfers: Patient Percentage: 50%  Transfer via Lift Equipment: [place]  Ambulation/Gait  Ambulation/Gait Assistance: Not tested (comment)  Stairs: No  Wheelchair Mobility  Wheelchair Mobility: No  ADL:  ADL  Grooming: Performed;Wash/dry face;Set up  Where Assessed - Grooming: Supine, head of bed up  Upper Body Bathing: Simulated;Minimal assistance  Where Assessed - Upper Body Bathing: Supine, head of bed up  Lower Body Bathing: Simulated;Maximal assistance  Where Assessed - Lower Body Bathing: Rolling right and/or left;Supine, head of bed up  Upper Body Dressing: Simulated;Minimal assistance  Where Assessed - Upper Body Dressing: Supine, head of bed up  Lower Body Dressing: Simulated;Maximal assistance  Where Assessed - Lower Body Dressing: Supine, head of bed up;Rolling right and/or left  Cognition:  Cognition  Overall Cognitive Status: Within Functional Limits for tasks assessed  Orientation Level: Oriented X4  Cognition  Arousal/Alertness: Awake/alert  Behavior During Therapy: Flat affect  Overall Cognitive Status: Within Functional Limits for tasks assessed  Physical Exam:  Blood pressure 113/70, pulse 106, temperature 98.3 F (36.8 C), temperature source Oral, resp. rate 18, height 5\' 10"  (1.778 m), weight 116.6 kg (257 lb 0.9 oz), SpO2 93.00%.  Physical Exam  Constitutional: He is oriented to person, place, and time.  HENT:  Head: Normocephalic.dentition fair. Oral mucosa pink and moist  Eyes: EOM are normal.  Neck: Normal range of motion. Neck supple. No thyromegaly present.   Cardiovascular: Normal rate. No murmur or gallop. LLE with 1+ edema. Chronic vascular changes.  Pulmonary/Chest: Effort normal and breath sounds normal. No respiratory distress. No wheezes  Abdominal: Soft. Bowel sounds are normal. He exhibits no distension.  Neurological: He is alert and oriented to person, place, and time. CN grossly intact. DTR's decreased.  Pt is alert and appropriate. Right deltoid is 3 to 3+/5. Bicep/tricep 4/5. HI are 3-. LUE is grossly 5/5. LLE is 4-/5 proximal to 3+/4- distal. Sensation grossly intact except for LT distal LLE.  Skin:  Right amputation site is dressed without odor although there continues to be substantial serosanginous discharge. There is a heavy dressing also to the left foot without signs of seepage or drainage. Small scabs are noted over the first toe.   Psychiatric: He is generally appropriate although he's a little flat. Marland Kitchen His behavior is normal. Judgment and thought content normal  Results for orders placed during the hospital encounter of 07/14/13 (from the past 48 hour(s))   GLUCOSE, CAPILLARY Status: None    Collection Time    07/23/13 12:04 PM   Result  Value  Range    Glucose-Capillary  99  70 - 99 mg/dL   GLUCOSE, CAPILLARY Status: Abnormal    Collection Time    07/23/13 4:27 PM   Result  Value  Range    Glucose-Capillary  138 (*)  70 - 99 mg/dL   GLUCOSE, CAPILLARY Status: Abnormal    Collection Time    07/23/13 9:42 PM   Result  Value  Range    Glucose-Capillary  138 (*)  70 - 99 mg/dL   BASIC METABOLIC PANEL Status: Abnormal    Collection Time    07/24/13 5:30 AM   Result  Value  Range    Sodium  143  135 - 145 mEq/L    Potassium  3.7  3.5 - 5.1 mEq/L    Chloride  102  96 - 112 mEq/L    CO2  33 (*)  19 - 32 mEq/L    Glucose,  Bld  84  70 - 99 mg/dL    BUN  54 (*)  6 - 23 mg/dL    Creatinine, Ser  1.61 (*)  0.50 - 1.35 mg/dL    Calcium  8.0 (*)  8.4 - 10.5 mg/dL    GFR calc non Af Amer  51 (*)  >90 mL/min    GFR calc Af  Amer  59 (*)  >90 mL/min    Comment:  (NOTE)     The eGFR has been calculated using the CKD EPI equation.     This calculation has not been validated in all clinical situations.     eGFR's persistently <90 mL/min signify possible Chronic Kidney     Disease.   GLUCOSE, CAPILLARY Status: None    Collection Time    07/24/13 8:01 AM   Result  Value  Range    Glucose-Capillary  71  70 - 99 mg/dL   GLUCOSE, CAPILLARY Status: Abnormal    Collection Time    07/24/13 11:53 AM   Result  Value  Range    Glucose-Capillary  150 (*)  70 - 99 mg/dL   GLUCOSE, CAPILLARY Status: Abnormal    Collection Time    07/24/13 4:49 PM   Result  Value  Range    Glucose-Capillary  179 (*)  70 - 99 mg/dL    Comment 1  Notify RN    GLUCOSE, CAPILLARY Status: Abnormal    Collection Time    07/24/13 9:03 PM   Result  Value  Range    Glucose-Capillary  150 (*)  70 - 99 mg/dL    Comment 1  Notify RN    CBC Status: Abnormal    Collection Time    07/25/13 5:30 AM   Result  Value  Range    WBC  5.8  4.0 - 10.5 K/uL    RBC  2.93 (*)  4.22 - 5.81 MIL/uL    Hemoglobin  7.7 (*)  13.0 - 17.0 g/dL    HCT  09.6 (*)  04.5 - 52.0 %    MCV  85.3  78.0 - 100.0 fL    MCH  26.3  26.0 - 34.0 pg    MCHC  30.8  30.0 - 36.0 g/dL    RDW  40.9 (*)  81.1 - 15.5 %    Platelets  183  150 - 400 K/uL   GLUCOSE, CAPILLARY Status: Abnormal    Collection Time    07/25/13 7:58 AM   Result  Value  Range    Glucose-Capillary  146 (*)  70 - 99 mg/dL    Comment 1  Documented in Chart     Comment 2  Notify RN     No results found.  Post Admission Physician Evaluation:  1. Functional deficits secondary to right BKA who is readmitted after wound dehiscence after fall on inpatient rehab. 2. Patient is admitted to receive collaborative, interdisciplinary care between the physiatrist, rehab nursing staff, and therapy team. 3. Patient's level of medical complexity and substantial therapy needs in context of that medical necessity cannot  be provided at a lesser intensity of care such as a SNF. 4. Patient has experienced substantial functional loss from his/her baseline which was documented above under the "Functional History" and "Functional Status" headings. Judging by the patient's diagnosis, physical exam, and functional history, the patient has potential for functional progress which will result in measurable gains while on inpatient rehab. These gains will be of substantial and  practical use upon discharge in facilitating mobility and self-care at the household level. 5. Physiatrist will provide 24 hour management of medical needs as well as oversight of the therapy plan/treatment and provide guidance as appropriate regarding the interaction of the two. 6. 24 hour rehab nursing will assist with bladder management, bowel management, safety, skin/wound care, disease management, medication administration, pain management and patient education and help integrate therapy concepts, techniques,education, etc. 7. PT will assess and treat for/with: Lower extremity strength, range of motion, stamina, balance, functional mobility, safety, adaptive techniques and equipment, pre-pros education, pain mgt. Goals are: mod I to supervision. 8. OT will assess and treat for/with: ADL's, functional mobility, safety, upper extremity strength, adaptive techniques and equipment, NMR, pain mgt, pre-pros education. Goals are: mod I to set up. 9. SLP will assess and treat for/with: n/a. Goals are: n/a. 10. Case Management and Social Worker will assess and treat for psychological issues and discharge planning. 11. Team conference will be held weekly to assess progress toward goals and to determine barriers to discharge. 12. Patient will receive at least 3 hours of therapy per day at least 5 days per week. 13. ELOS: 7-10 days  14. Prognosis: good Medical Problem List and Plan:  1. right BKA due to her Charcot foot/abscess complicated by wound dehiscence after  fall, prior left CVA with residual right-sided weakness  2. DVT Prophylaxis/Anticoagulation: SCDs left lower extremity  3. Pain Management: Presently Tylenol only. Will monitor with increased therapy  4. Mood: Xanax 0.25 mg twice a day as needed. Provide emotional support  5. Neuropsych: This patient is capable of making decisions on his own behalf.  6. Recent multi-toe amputation left foot February 2014.  dressing changes followed by wound care nurse. Increase frequency as needed to keep wounds clean and dry. 7. ID. Sepsis/MSSA bacteremia. Ancef completes today- monitor clinically going forward 8. CAD/NSTEMIx2. Continue aspirin therapy. Followup cardiothoracic surgery to consider CABG in the near future  9. Diastolic congestive heart failure. Continue intravenous Lasix therapy as directed for now. Cardiology services will continue to follow and adjust Lasix as needed. Check daily weights with I's and O's.  10. Diabetes mellitus peripheral neuropathy. Hemoglobin A1c 8.2 Levemir 23 units each bedtime. Check blood sugars a.c. and at bedtime  11. Hypertension. Hydralazine 25 mg every 6 hours, Coreg 25 mg twice a day. Monitor with increased mobility  12. Acute renal failure. Renal function improved with latest creatinine 1.54. Followup chemistries  13. Acute blood loss anemia. Latest hemoglobin 7.7. Transfuse if symptomatic   Ranelle Oyster, MD, [facility] Physical Medicine & Rehabilitation   07/25/2013

## 2013-07-26 NOTE — PMR Pre-admission (Signed)
PMR Admission Coordinator Pre-Admission Assessment  Patient: Albert [name] is an 52 y.o., male MRN: [id number] DOB: [birth year] Height: 5\' 10"  (177.8 cm) Weight: 114.17 kg (251 lb 11.2 oz)              Insurance Information HMO:      PPO: Yes     PCP:       IPA:       80/20:       OTHER: Group # [id number] PRIMARY: [employer]      Policy#: [id number]      Subscriber: Albert [name] CM Name: Albert [name]      Phone#: [phone]     Fax#:   Pre-Cert#: [id number]      Employer: [employer] Benefits:  Phone #: [phone]     Name: Albert [name]. Date: 10/17/12     Deduct: $0      Out of Pocket Max: $500(met)      Life Max: unlimited CIR: 80% w/auth      SNF: 80% w/auth  90 days max Outpatient: 100%    60 visit limit     Co-Pay: none Home Health: 100%      Co-Pay: none DME: 100%     Co-Pay:  none Providers:  In network  Emergency Contact Information Contact Information   Name Relation Home Work Mobile   Albert [name] Spouse [phone]  [phone]     Current Medical History  Patient Admitting Diagnosis: R BKA, old L CVA with R HP   History of Present Illness: A 52 y.o. 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 Admitted 06/25/2013 with nonhealing ulcer to the right foot and Charcot collapse right foot. Underwent irrigation and debridement of right foot plantar abscess 06/25/2013 per Dr. Magnus Ivan. Postoperatively with hypotension and tachycardia. Cardiology services consulted with findings of elevated troponin levels of 10. Echocardiogram with distal septal hypokinesis with overall low-normal LVEF. Elevated troponin levels are likely related to underlying sepsis. Underwent cardiac catheterization 06/28/2013 with findings of triple vessel CAD NSTEMI secondary to demand ischemia and mild LV  systolic dysfunction. Patient placed on medical management with aspirin therapy. Patient with ongoing ischemic changes right lower extremity and underwent right below-knee amputation 06/29/2013 per Dr. Lajoyce Corners. Cardiothoracic surgery followup to consider CABG after recovery from BKA. TEE completed 07/03/2013 to rule out any vegetation that was negative with ejection fraction 45-50% .patient currently remains on Ancef therapy per infectious disease for sepsis /MSSA bacteremia with plan for 28 day duration to be completed 07/26/2013. Physical therapy evaluation completed 06/30/2013 with recommendations of physical medicine rehabilitation consult to consider inpatient rehabilitation services. Patient was admitted inpatient rehabilitation services 07/03/2013. [name] was participating with therapies and noted 07/11/2013 with increased shortness of breath and increased weight gain of 10 pounds. He received intravenous Lasix with improvement in shortness of breath. He subsequently started on cefepime and vancomycin for concern of HCAP per critical care medicine. He developed hypotension 85/57 and was given normal saline 250 cc bolus. Patient also with a fall transferring from bed to bedside commode 07/11/2013. Noted bleeding from right BKA site with increased need for dressing changes and 2 staples in the lateral aspect of the incision had partially dehisced. Followup orthopedic services and no further surgical intervention required by continue dressing changes and monitor. Due to ongoing bouts of pulmonary edema as well his recent wound  dehiscence patient was discharged to acute care services 07/14/2013 for medical management. Followup EKG showed sinus tachycardia nonspecific ST-T changes followup cardiology services and advised to continue to cycle troponin. A followup echocardiogram 07/15/2013 showed ejection fraction of 45% with decreased left ventricular diastolic function. Advised to continue medical management with  aspirin therapy. Still plan remains for CABG in the future. Hospital acute stay complicated by hyperkalemia as well as mild elevation in creatinine 1.78-2.8 and renal services consulted felt to be secondary to ischemic ATN and reason intravenous Lasix. His renal function normalized 1.5 and monitored. Wound care followup for nonhealing wound left foot from previous amputation of multiple toes in February 2014 and dressing changes as advised. Patient was some increased swelling right upper extremity venous Doppler study showed no signs of DVT. [name] therapies have been resumed and patient was felt to be a good candidate for inpatient rehabilitation services and will be admitted for comprehensive rehabilitation program today.    Past Medical History  Past Medical History  Diagnosis Date  . Hypertension   . Diabetes mellitus without complication ~2000    last HbA1c ~9  . Hypercholesteremia   . CVA (cerebral infarction) 2011    R hand deficit  . Chronic sinusitis   . Allergic rhinitis   . Chronic cough   . Complication of anesthesia     couldn't swallow and talk  . Stroke    Family History  family history includes Breast cancer in his sister; Depression in his maternal grandfather, maternal grandmother, paternal grandfather, and paternal grandmother; Diabetes in his mother and another family member; Heart disease in his maternal grandfather, maternal grandmother, paternal grandfather, and paternal grandmother; Hyperlipidemia in his mother; Pancreatic cancer in his mother.  Prior Rehab/Hospitalizations: Was admitted to CIR on 09/17 until 07/14/13 when he had to transfer back to acute care.   Current Medications  Current facility-administered medications:0.9 %  sodium chloride infusion, , Intravenous, Continuous, Carolan Clines, MD;  ALPRAZolam Prudy Feeler) tablet 0.25 mg, 0.25 mg, Oral, BID PRN, Simonne Martinet, NP, 0.25 mg at 07/25/13 2150;  aspirin chewable tablet 81 mg, 81 mg, Oral, Daily,  Cassell Clement, MD, 81 mg at 07/26/13 0957;  carvedilol (COREG) tablet 25 mg, 25 mg, Oral, BID WC, Cassell Clement, MD, 25 mg at 07/26/13 0956 famotidine (PEPCID) tablet 20 mg, 20 mg, Oral, BID, Katelyn C Felt, RPH, 20 mg at 07/26/13 0957;  furosemide (LASIX) 160 mg in dextrose 5 % 50 mL IVPB, 160 mg, Intravenous, Q12H, Pricilla Riffle, MD, 160 mg at 07/26/13 0015;  guaiFENesin-dextromethorphan (ROBITUSSIN DM) 100-10 MG/5ML syrup 5 mL, 5 mL, Oral, Q4H PRN, Coralyn Helling, MD, 5 mL at 07/17/13 1700 insulin aspart (novoLOG) injection 0-15 Units, 0-15 Units, Subcutaneous, TID WC, Coralyn Helling, MD, 2 Units at 07/25/13 1740;  insulin detemir (LEVEMIR) injection 23 Units, 23 Units, Subcutaneous, QHS, Lonia Blood, MD, 23 Units at 07/25/13 2150;  ondansetron (ZOFRAN) injection 4 mg, 4 mg, Intravenous, Q6H PRN, Carolan Clines, MD, 4 mg at 07/20/13 0151 polyethylene glycol (MIRALAX / GLYCOLAX) packet 17 g, 17 g, Oral, Daily, Lonia Blood, MD, 17 g at 07/25/13 1021;  senna-docusate (Senokot-S) tablet 1 tablet, 1 tablet, Oral, BID, Lonia Blood, MD, 1 tablet at 07/26/13 0957;  sodium chloride 0.9 % injection 10-40 mL, 10-40 mL, Intracatheter, PRN, Ripudeep K Rai, MD;  sodium chloride 0.9 % injection 3 mL, 3 mL, Intravenous, Q12H, Alejandro Paya, DO, 3 mL at 07/25/13 1023 zolpidem (AMBIEN) tablet 5 mg,  5 mg, Oral, QHS PRN, Lonia Blood, MD, 5 mg at 07/25/13 2150  Patients Current Diet: Carb Control  Precautions / Restrictions Precautions Precautions: Fall Precaution Comments: R BKA Restrictions Weight Bearing Restrictions: Yes RLE Weight Bearing: Non weight bearing Other Position/Activity Restrictions: elevate operated limb, do not prop pillows under knee; try to position on LEFT side so that RIGHT hip can be in neutral/extension   Prior Activity Level Limited Community (1-2x/wk): Independent and driving.  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  Assistive Devices/Equipment: CBG Meter Home Equipment: Environmental consultant - 2 wheels;Wheelchair - manual  Prior Functional Level Prior Function Level of Independence: Independent Comments: Worked at [employer]  Current Functional Level Cognition  Overall Cognitive Status: Within Functional Limits for tasks assessed Orientation Level: Oriented X4    Extremity Assessment (includes Sensation/Coordination)          ADLs  Grooming: Performed;Wash/dry face;Set up Where Assessed - Grooming: Supine, head of bed up Upper Body Bathing: Simulated;Minimal assistance Where Assessed - Upper Body Bathing: Supine, head of bed up Lower Body Bathing: Simulated;Maximal assistance Where Assessed - Lower Body Bathing: Rolling right and/or left;Supine, head of bed up Upper Body Dressing: Simulated;Minimal assistance Where Assessed - Upper Body Dressing: Supine, head of bed up Lower Body Dressing: Simulated;Maximal assistance Where Assessed - Lower Body Dressing: Supine, head of bed up;Rolling right and/or left    Mobility  Bed Mobility: Rolling Right;Rolling Left Rolling Right: 4: Min guard;With rail Rolling Left: 4: Min assist;With rail Right Sidelying to Sit: HOB elevated;With rails;1: +2 Total assist Right Sidelying to Sit: Patient Percentage: 50% Supine to Sit: 1: +2 Total assist;With rails;HOB elevated Supine to Sit: Patient Percentage: 60% Sitting - Scoot to Edge of Bed: 3: Mod assist Sit to Supine: 3: Mod assist;HOB flat    Transfers  Transfers: Sit to Stand;Stand to Sit Sit to Stand: 1: +2 Total assist Sit to Stand: Patient Percentage: 60% Stand to Sit: 1: +2 Total assist Stand to Sit: Patient Percentage: 60% Squat Pivot Transfers: Not tested (comment) Lateral/Scoot Transfers: 1: +2 Total assist;With armrests removed;From elevated surface Lateral Transfers: Patient Percentage: 50% Transfer via Lift Equipment: Sport and exercise psychologist / Gait / Stairs / Psychologist, prison and probation services   Ambulation/Gait Ambulation/Gait Assistance: Not tested (comment) Stairs: No Corporate treasurer: No    Posture / Games developer Sitting - Balance Support: Left upper extremity supported;Feet unsupported Static Sitting - Level of Assistance: 4: Min assist Static Sitting - Comment/# of Minutes: 1 minute Dynamic Sitting Balance Dynamic Sitting - Balance Activities: Forward lean/weight shifting;Lateral lean/weight shifting;Reaching for objects;Reaching across midline Dynamic Sitting - Comments: min guard assist needed. Static Standing Balance Static Standing - Balance Support: Bilateral upper extremity supported Static Standing - Level of Assistance: 1: +2 Total assist (pt=80%) Static Standing - Comment/# of Minutes: Pt stood x 3 for 45-60 seconds with walker.    Special needs/care consideration BiPAP/CPAP No CPM No Continuous Drip IV Is receiving IV lasis 160 mg q 12 hrs Dialysis No       Life Vest No Oxygen No Special Bed No Trach Size No Wound Vac (area) No     Skin Has R BKA incision with dressing.  Has dressings to L foot wounds, L knee wound, R elbow wound and coccyx wound.  WOC nurse is following.  Bowel mgmt: Had BM 07/25/13 Bladder mgmt: Has a foley catheter Diabetic mgmt yes, on insulin    Previous Home Environment Living Arrangements: Spouse/significant other  Lives With: Spouse;Family Available Help at Discharge: Available PRN/intermittently Type of Home: House Home Layout: Two level Alternate Level Stairs-Rails: None Alternate Level Stairs-Number of Steps: 1 step between living areas Home Access: Level entry Entrance Stairs-Number of Steps: One step from garage into car port, then level entry into home Bathroom Shower/Tub: Walk-in shower Bathroom Toilet: Handicapped height Bathroom Accessibility: Yes How Accessible: Accessible via wheelchair;Accessible via walker Home Care Services:  No Additional Comments: Patient owns RW, shower chair, and wheelchair.  Discharge Living Setting Plans for Discharge Living Setting: Patient's home;House;Lives with (comment) (Lives with wife, 65 yo and 21 yo sons.) Type of Home at Discharge: House Discharge Home Layout: Two level (Has 2 steps down to master BR/BR) Alternate Level Stairs-Rails: None Alternate Level Stairs-Number of Steps: 2 Discharge Home Access: Stairs to enter Entrance Stairs-Number of Steps: 1 step up to doorway and then over threshold Does the patient have any problems obtaining your medications?: No  Social/Family/Support Systems Patient Roles: Spouse;Parent (Has a 58 yr old and 39 yo son.) Contact Information: Patient's cell is [phone] Anticipated Caregiver: Wife and sons Anticipated Caregiver's Contact Information: Richad Ramsay - wife [phone] Ability/Limitations of Caregiver: Son is there when wife is working.  Sons are 20 and 14.  Wife works 7a-7p at [employer] Caregiver Availability: 24/7 Discharge Plan Discussed with Primary Caregiver: Yes Is Caregiver In Agreement with Plan?: Yes Does Caregiver/Family have Issues with Lodging/Transportation while Pt is in Rehab?: No  Goals/Additional Needs Patient/Family Goal for Rehab: Mod I/S PT/OT, no ST goals Expected length of stay: 10-14 days Cultural Considerations: None Dietary Needs: Carb Mod Med calorie, thin liquids Equipment Needs: TBD Pt/Family Agrees to Admission and willing to participate: Yes Program Orientation Provided & Reviewed with Pt/Caregiver Including Roles  & Responsibilities: Yes   Decrease burden of Care through IP rehab admission: Specialzed equipment needs, Decrease number of caregivers and Patient/family education   Possible need for SNF placement upon discharge: Not expected   Patient Condition: This patient's medical and functional status has changed since the consult dated: 07/01/13 in which the Rehabilitation  Physician determined and documented that the patient's condition is appropriate for intensive rehabilitative care in an inpatient rehabilitation facility. See "History of Present Illness" (above) for medical update. Functional changes are: Currently requiring total assist +2 for transfers patient contributing 60%. Patient's medical and functional status update has been discussed with the Rehabilitation physician and patient remains appropriate for inpatient rehabilitation. Will admit to inpatient rehab today.  Preadmission Screen Completed By:  Trish Mage, 07/26/2013 11:05 AM ______________________________________________________________________   Discussed status with Dr. Riley Kill on 07/26/13 at 1129 and received telephone approval for admission today.  Admission Coordinator:  Trish Mage, time1129/Date10/10/14

## 2013-07-26 NOTE — Progress Notes (Signed)
Rehab admissions - I have authorization for acute inpatient rehab admission from [facility] for today.  Bed available and can admit to rehab today.  Dr. Riley Kill is aware that patient is receiving IV lasix.  Call me for questions.  #[phone]

## 2013-07-26 NOTE — Consult Note (Signed)
WOC consult requested; already performed on 10/7.  Refer to previous progress note for wound assessment, measurement, and plan of care to left foot wound.   Please re-consult if further assistance is needed.  Thank-you,  Cammie Mcgee MSN, RN, CWOCN, [place], CNS [phone]

## 2013-07-26 NOTE — Progress Notes (Signed)
Subjective: Denies CP  No SOB Objective: Filed Vitals:   07/25/13 1708 07/25/13 2043 07/26/13 0500 07/26/13 0951  BP: 96/63 108/77 109/72 124/78  Pulse: 96 99 93 98  Temp:  98.2 F (36.8 C) 98.6 F (37 C)   TempSrc:  Oral Axillary   Resp:  18    Height:      Weight:   251 lb 11.2 oz (114.17 kg)   SpO2:  97% 99%    Weight change: -5 lb 5.7 oz (-2.43 kg)  Intake/Output Summary (Last 24 hours) at 07/26/13 1006 Last data filed at 07/26/13 0700  Gross per 24 hour  Intake    360 ml  Output   2000 ml  Net  -1640 ml   Net:  23.6 L negative  General: Alert, awake, oriented x3, in no acute distress Neck:  JVP is difficlut to assess Heart: Regular rate and rhythm, without murmurs, rubs, gallops.  Lungs: Clear to auscultation.  No rales or wheezes. Exemities:  1+ edema.   Neuro: Grossly intact, nonfocal.   Lab Results: Results for orders placed during the hospital encounter of 07/14/13 (from the past 24 hour(s))  GLUCOSE, CAPILLARY     Status: Abnormal   Collection Time    07/25/13 11:36 AM      Result Value Range   Glucose-Capillary 141 (*) 70 - 99 mg/dL   Comment 1 Documented in Chart     Comment 2 Notify RN    GLUCOSE, CAPILLARY     Status: Abnormal   Collection Time    07/25/13  3:56 PM      Result Value Range   Glucose-Capillary 150 (*) 70 - 99 mg/dL   Comment 1 Documented in Chart     Comment 2 Notify RN    GLUCOSE, CAPILLARY     Status: Abnormal   Collection Time    07/25/13  8:42 PM      Result Value Range   Glucose-Capillary 200 (*) 70 - 99 mg/dL   Comment 1 Notify RN    PRO B NATRIURETIC PEPTIDE     Status: Abnormal   Collection Time    07/26/13 12:00 AM      Result Value Range   Pro B Natriuretic peptide (BNP) 30099.0 (*) 0 - 125 pg/mL  BASIC METABOLIC PANEL     Status: Abnormal   Collection Time    07/26/13  6:00 AM      Result Value Range   Sodium 142  135 - 145 mEq/L   Potassium 3.4 (*) 3.5 - 5.1 mEq/L   Chloride 100  96 - 112 mEq/L   CO2 35 (*)  19 - 32 mEq/L   Glucose, Bld 82  70 - 99 mg/dL   BUN 50 (*) 6 - 23 mg/dL   Creatinine, Ser 8.11 (*) 0.50 - 1.35 mg/dL   Calcium 8.4  8.4 - 91.4 mg/dL   GFR calc non Af Amer 49 (*) >90 mL/min   GFR calc Af Amer 57 (*) >90 mL/min  CBC     Status: Abnormal   Collection Time    07/26/13  6:00 AM      Result Value Range   WBC 5.2  4.0 - 10.5 K/uL   RBC 2.81 (*) 4.22 - 5.81 MIL/uL   Hemoglobin 7.4 (*) 13.0 - 17.0 g/dL   HCT 78.2 (*) 95.6 - 21.3 %   MCV 84.7  78.0 - 100.0 fL   MCH 26.3  26.0 - 34.0 pg  MCHC 31.1  30.0 - 36.0 g/dL   RDW 16.1 (*) 09.6 - 04.5 %   Platelets 178  150 - 400 K/uL  GLUCOSE, CAPILLARY     Status: None   Collection Time    07/26/13  8:09 AM      Result Value Range   Glucose-Capillary 95  70 - 99 mg/dL   Comment 1 Documented in Chart     Comment 2 Notify RN      Studies/Results: @RISRSLT24 @  Medications: Reviewed   @PROBHOSP @  1.  NSTEMI  Patient with 3V CAD  By cath on 06/28/13.  Plan for revasc when recovered  2.  Acute systollic CHF  LVEF 40 to 45%  Continues to dirues  Would continue lasix bid  3.  Anemia  Hgb rel stable.   Check anemia panel  4.  Renal Follow.  Replete K  LOS: 12 days   Dietrich Pates 07/26/2013, 10:06 AM

## 2013-07-26 NOTE — Discharge Summary (Signed)
Physician Discharge Summary  Patient ID: Albert [name] MRN: [id number] DOB/AGE: 1961/07/01 52 y.o.  Admit date: 07/14/2013 Discharge date: 07/26/2013  Primary Care Physician:  Rollene Rotunda, MD  Primary Discharge Diagnoses:   Acute systolic CHF with ischemic cardiomyopathy  Secondary discharge diagnoses . Acute kidney failure-improving   . Shock circulatory . Coronary atherosclerosis of native coronary artery . Acute systolic heart failure . Diabetes mellitus out of control . NSTEMI (non-ST elevated myocardial infarction) . Acute pulmonary edema- improving  . Cardiomyopathy, ischemic . Gangrene . Hypoxemia- improved   Consults:  Cardiology, Dr. Tenny Craw Inpatient rehabilitation Nephrology  critical care   Allergies:   Allergies  Allergen Reactions  . Amoxicillin Nausea And Vomiting     Discharge Medications:   Medication List    STOP taking these medications       ceFAZolin 2-3 GM-% Solr  Commonly known as:  ANCEF     irbesartan-hydrochlorothiazide 300-12.5 MG per tablet  Commonly known as:  AVALIDE     lisinopril 5 MG tablet  Commonly known as:  PRINIVIL,ZESTRIL      TAKE these medications       aspirin EC 81 MG tablet  Take 81 mg by mouth daily.     DSS 100 MG Caps  Take 100 mg by mouth 2 (two) times daily.     fentaNYL 0.05 MG/ML injection  Commonly known as:  SUBLIMAZE  Inject 1-2 mLs (50-100 mcg total) into the vein every 2 (two) hours as needed for severe pain (or RASS > 0).     glyBURIDE-metformin 5-500 MG per tablet  Commonly known as:  GLUCOVANCE  Take 2 tablets by mouth 2 (two) times daily.     insulin detemir 100 UNIT/ML injection  Commonly known as:  LEVEMIR  Inject 0.25 mLs (25 Units total) into the skin every evening.     metoCLOPramide 5 MG tablet  Commonly known as:  REGLAN  Take 1-2 tablets (5-10 mg total) by mouth every 8 (eight) hours as needed (if ondansetron (ZOFRAN) ineffective.).     metoprolol 50 MG tablet  Commonly  known as:  LOPRESSOR  Take 1 tablet (50 mg total) by mouth 2 (two) times daily.     NOVOLOG FLEXPEN 100 UNIT/ML injection  Generic drug:  insulin aspart  Inject 12 Units into the skin 3 (three) times daily. Take every time he eats per patient     ondansetron 8 MG tablet  Commonly known as:  ZOFRAN  Take by mouth every 8 (eight) hours as needed for nausea. For upset stomach     oxyCODONE 5 MG immediate release tablet  Commonly known as:  Oxy IR/ROXICODONE  Take 1 tablet (5 mg total) by mouth every 4 (four) hours as needed.     pantoprazole 40 MG tablet  Commonly known as:  PROTONIX  Take 1 tablet (40 mg total) by mouth daily.     pregabalin 75 MG capsule  Commonly known as:  LYRICA  Take 75 mg by mouth 2 (two) times daily.     simvastatin 40 MG tablet  Commonly known as:  ZOCOR  Take 40 mg by mouth every evening.         Brief H and P: For complete details please refer to admission H and P, but in brief  The patient is 52 yr old WM w/ a pmhx significant for HTN, CVA, ISCM, originally admitted 06/25/2013 with SIRS and MSSA bacteremia from gangrenous right foot. He underwent a R BKA on  9/13. During that admission, he became hypotensive and suffered a NSTEMI. He underwent LHC on 06/28/2013 with findings of 3v CAD with an EF of 40%. There were plans to perform CABG once he stabilized. He was subsequently transferred to the rehab floor 9/17. While there he developed increasing SOB with increased weight (10 lbs over approx one week). He received IV lasix with only temporary improvement in his SOB. He was subsequently started on Cefepime and Vancomcyn for a concern of HCAP. (It was noted that the patient was on Ancef for MSSA bacteremia per ID (9/13 start date with plans to continue until 10/10) but this was discontinued when coverage was broadened for HCAP.)  On 07/14/2013, rapid response team was called to the rehab unit due to patient complaining of feeling unwell. He was found to have a BP  of 85/57. He was noted to have decreased UOP with only 100 cc over about 7 hours. He was given a NS 250 cc bolus and transferred to the SDU. BMET revealed a K of 5.9. EKG was performed with no evidence of peaked T waves. He was noted to have a Cr of 1.77, increased from 1.25 on 9/27. Later in that day, he developed hypoxic resp failure, with sats dropping into the mid 80% range despite NRB. He was subsequently transferred to the ICU on the PCCM service.   Procedures:  CVVHD 9/29 >> 10/1  10/3 - RUE doppler - no DVT   Antibiotics:  Ancef 06/29/2013 >> 07/11/2013 + 10/03 >> 07/26/2013 (planned)  Cefepime 9/26>>> 10/03  Vanc 9/25 >>> 10/02   Hospital Course:   Recent sepsis/MSSA bacteremia from gangrenous R foot s/p R BKA 06/29/13  - admit 9/9->9/17->CIR - TEE negative for bacteremia 07/03/13  - Currently on Ancef, to complete abx course 10/10  Per last ID note on 07/03/2013, by Dr. Ninetta Lights Would plan on 28 days of total anbx  His TEE is (-) for vegetation but his L foot wound is concerning and will need f/u.  Will have him seen in ID office in 3-4 weeks.    3-vessel CAD by cath 06/28/13 now with 2nd NSTEMI  - inital plan was for CABG once recovered from BKA - Cardiology following   Ischemic cardiomyopathy with acute systolic CHF  EF 29% by TTE 9/10 - 45-50% by TEE 9/17 - Cardiology following - remains volume overloaded, placed on Lasix per cardiology , on aspirin, Coreg  -Discussed in detail with Dr. Tenny Craw today, recommended continuing the IV Lasix and cardiology will titrate Lasix. Patient will be followed by cardiology service in the inpatient rehabilitation. BNP still 30,099.    Acute kidney injury  Nephrology has signed off - required transient CVVHD - previously normal renal function. -  Creatinine function 1.5 today  Acute blood loss anemia - bleeding from surgical site s/p fall  Hemoglobin in 7.4- 7.7 range during this hospitalization, continue to monitor for now, continue to avoid  anticoag for now . FOBT on 07/15/13 negative  Acute hypoxic respiratory failure secondary to anemia and volume overload  Resolved -  O2 sats 99% on room air   B Pleural effusions  F/u CXR suggests stability - clinically stabilizing   RUE edema  Persists but improving - no DVT on doppler - elevate   Scrotal edema  Cont Foley - elvevate scrotum on towel - improving  Sinus tachycardia : improved  Hypotension Resolved   Transaminitis shock liver - resolved    Hyperkalemia Resolved   Diabetes mellitus CBG  stable at this time   Moderate mitral regurgitation  by TEE 07/03/13  Left foot wound  - Wound care was consulted on 07/23/13 and 07/26/13, recommended Silver hydrofiber for drainage control, bioburdan tx. Cover with dry dressing, wrap with kerlix. Change every other day.    Day of Discharge BP 93/60  Pulse 92  Temp(Src) 98.6 F (37 C) (Axillary)  Resp 18  Ht 5\' 10"  (1.778 m)  Wt 114.17 kg (251 lb 11.2 oz)  BMI 36.12 kg/m2  SpO2 99%  Physical Exam:   General: Alert and awake, oriented x3, not in any acute distress.  HEENT right neck dialysis catheter  CVS: Tachycardia, regular  Chest: Decreased breath sounds at the bases  Abdomen: soft nontender, nondistended, normal bowel sounds  Extremities:  scrotal edema, left foot dressing intact, right BKA   The results of significant diagnostics from this hospitalization (including imaging, microbiology, ancillary and laboratory) are listed below for reference.    LAB RESULTS: Basic Metabolic Panel:  Recent Labs Lab 07/22/13 0500  07/25/13 1005 07/26/13 0600  NA 138  < > 143 142  K 4.4  < > 3.6 3.4*  CL 101  < > 100 100  CO2 27  < > 33* 35*  GLUCOSE 157*  < > 192* 82  BUN 63*  < > 49* 50*  CREATININE 1.71*  < > 1.74* 1.58*  CALCIUM 7.8*  < > 8.3* 8.4  PHOS 4.7*  --   --   --   < > = values in this interval not displayed. Liver Function Tests:  Recent Labs Lab 07/21/13 0500 07/22/13 0500 07/23/13 0500   AST 32  --  24  ALT 87*  --  19  ALKPHOS 83  --  68  BILITOT 0.5  --  0.4  PROT 6.1  --  5.5*  ALBUMIN 2.6* 2.5* 2.4*   No results found for this basename: LIPASE, AMYLASE,  in the last 168 hours No results found for this basename: AMMONIA,  in the last 168 hours CBC:  Recent Labs Lab 07/25/13 0530 07/26/13 0600  WBC 5.8 5.2  HGB 7.7* 7.4*  HCT 25.0* 23.8*  MCV 85.3 84.7  PLT 183 178   Cardiac Enzymes: No results found for this basename: CKTOTAL, CKMB, CKMBINDEX, TROPONINI,  in the last 168 hours BNP: No components found with this basename: POCBNP,  CBG:  Recent Labs Lab 07/26/13 0809 07/26/13 1142  GLUCAP 95 92    Significant Diagnostic Studies:  Dg Chest Port 1 View  07/15/2013   CLINICAL DATA:  Pulmonary edema.  IJ catheter insertion.  EXAM: PORTABLE CHEST - 1 VIEW  COMPARISON:  Chest x-ray dated 07/14/2013  FINDINGS: Right IJ catheter has been inserted and the tip is at the superior vena cava adjacent to the azygos vein. No pneumothorax. Improved bilateral pulmonary edema and pleural effusions. Heart size and vascularity are now normal.  IMPRESSION: IJ catheter tip at the level of the azygos vein in the superior vena cava. Improving pulmonary edema and effusions.   Electronically Signed   By: Geanie Cooley   On: 07/15/2013 17:53   Dg Chest Port 1 View  07/14/2013   CLINICAL DATA:  Hypoxemia, low blood pressure, respiratory distress.  EXAM: PORTABLE CHEST - 1 VIEW  COMPARISON:  07/12/2013  FINDINGS: Diffuse perihilar and scratch head diffuse bilateral airspace opacities compatible with edema. Heart is borderline in size. Suspect layering effusions. No acute bony abnormality.  IMPRESSION: Moderate pulmonary edema and layering effusions.  Electronically Signed   By: Charlett Nose M.D.   On: 07/14/2013 22:34     Disposition and Follow-up:  Future Appointments Provider Department Dept Phone   09/03/2013 1:30 PM Loreli Slot, MD [facility] [phone]       DISPOSITION: Inpatient rehabilitation  DIET: Carb modified  ACTIVITY: As tolerated  TESTS THAT NEED FOLLOW-UP BMET daily  DISCHARGE FOLLOW-UP Follow-up Information   Follow up with DUDA,MARCUS V, MD In 2 weeks.   Specialty:  Orthopedic Surgery   Contact information:   [address] [place] [address] [phone]       Follow up with Rollene Rotunda, MD In 2 weeks.   Specialty:  Cardiology   Contact information:   [address] [place] [address] [phone]       Time spent on Discharge: 40 minutes  Signed:   RAI,RIPUDEEP M.D. [facility] 07/26/2013, 1:05 PM Pager: [phone]

## 2013-07-26 NOTE — Progress Notes (Signed)
Occupational Therapy Treatment Patient Details Name: Albert [name] MRN: [id number] DOB: 1961-07-18 Today's Date: 07/26/2013 Time: 1011-1029 OT Time Calculation (min): 18 min  OT Assessment / Plan / Recommendation  History of present illness Albert [name] is a 52 year old male with past medical history significant for diabetes, CHF, and recent BKA for staph osteomyelitis, who is transferred from rehabilitation to the step down unit for hypotension & worsening hypoxemia.  Albert [name] has been followed by cardiology since admission for three-vessel coronary artery disease which potentially require CABG.  This has been delayed to allow adequate treatment of his current infection.  Over the last 3 days he has had worsening hypotension and shortness of breath.  This was initially attributed to worsening heart failure and volume overload.  He has been diuresed with Lasix however remains volume overloaded.  On the night of September 26 he had a fall getting back into his bed.  At that time his wound dehisced and he developed bleeding from his wound requiring multiple dressing changes.  On the evening of 928 he had an episode of worsening shortness of breath after fluid bolus for hypotension.  Chest x-ray shows bilateral large pleural effusions as well as pulmonary edema   OT comments  Pt making progress with functional goals and should continue with acute OT services to increase level of function and safety to return to CIR  Follow Up Recommendations  CIR    Barriers to Discharge   None    Equipment Recommendations  Tub/shower seat;3 in 1 bedside comode    Recommendations for Other Services Rehab consult  Frequency Min 2X/week   Progress towards OT Goals Progress towards OT goals: Progressing toward goals  Plan Discharge plan remains appropriate    Precautions / Restrictions Precautions Precautions: Fall Precaution Comments: R BKA Restrictions Weight Bearing Restrictions: Yes RLE Weight Bearing:  Non weight bearing Other Position/Activity Restrictions: elevate operated limb, do not prop pillows under knee; try to position on LEFT side so that RIGHT hip can be in neutral/extension   Pertinent Vitals/Pain No c/o pain    ADL  Grooming: Performed;Wash/dry face;Set up Where Assessed - Grooming: Unsupported sitting;Other (comment) (EOB) Upper Body Bathing: Simulated;Min guard;Set up Where Assessed - Upper Body Bathing: Unsupported sitting Lower Body Bathing: Simulated;Moderate assistance Upper Body Dressing: Performed;Min guard    OT Diagnosis:    OT Problem List:   OT Treatment Interventions:     OT Goals(current goals can now be found in the care plan section)    Visit Information  Last OT Received On: 07/26/13 Assistance Needed: +1 History of Present Illness: Albert [name] is a 52 year old male with past medical history significant for diabetes, CHF, and recent BKA for staph osteomyelitis, who is transferred from rehabilitation to the step down unit for hypotension & worsening hypoxemia.  Albert [name] has been followed by cardiology since admission for three-vessel coronary artery disease which potentially require CABG.  This has been delayed to allow adequate treatment of his current infection.  Over the last 3 days he has had worsening hypotension and shortness of breath.  This was initially attributed to worsening heart failure and volume overload.  He has been diuresed with Lasix however remains volume overloaded.  On the night of September 26 he had a fall getting back into his bed.  At that time his wound dehisced and he developed bleeding from his wound requiring multiple dressing changes.  On the evening of 928 he had an episode of worsening shortness of breath after  fluid bolus for hypotension.  Chest x-ray shows bilateral large pleural effusions as well as pulmonary edema    Subjective Data      Prior Functioning  Prior Function Comments: Worked at [employer]   Cognition Arousal/Alertness: Awake/alert Behavior During Therapy: Flat affect Overall Cognitive Status: Within Functional Limits for tasks assessed    Mobility  Bed Mobility Bed Mobility: Rolling Right;Rolling Left Rolling Right: With rail;5: Supervision Rolling Left: With rail;4: Min guard Right Sidelying to Sit: HOB elevated;With rails;4: Min assist Supine to Sit: With rails;HOB elevated;4: Min assist Sitting - Scoot to Edge of Bed: 4: Min assist Sit to Supine: 4: Min assist Transfers Transfers: Not assessed    Exercises      Balance Dynamic Sitting Balance Dynamic Sitting - Balance Support: No upper extremity supported;During functional activity Dynamic Sitting - Level of Assistance: 5: Stand by assistance;Other (comment) (min guard A)   End of Session OT - End of Session Activity Tolerance: Patient tolerated treatment well Patient left: in bed;with call bell/phone within reach;Other (comment);with family/visitor present (sitting EOB with rehab nurse coordinater)  GO     Galen Manila 07/26/2013, 12:09 PM

## 2013-07-27 ENCOUNTER — Inpatient Hospital Stay (HOSPITAL_COMMUNITY): Payer: 59 | Admitting: Physical Therapy

## 2013-07-27 ENCOUNTER — Inpatient Hospital Stay (HOSPITAL_COMMUNITY): Payer: 59 | Admitting: *Deleted

## 2013-07-27 DIAGNOSIS — IMO0001 Reserved for inherently not codable concepts without codable children: Secondary | ICD-10-CM

## 2013-07-27 DIAGNOSIS — S88119A Complete traumatic amputation at level between knee and ankle, unspecified lower leg, initial encounter: Secondary | ICD-10-CM

## 2013-07-27 DIAGNOSIS — I10 Essential (primary) hypertension: Secondary | ICD-10-CM

## 2013-07-27 LAB — GLUCOSE, CAPILLARY
Glucose-Capillary: 148 mg/dL — ABNORMAL HIGH (ref 70–99)
Glucose-Capillary: 152 mg/dL — ABNORMAL HIGH (ref 70–99)

## 2013-07-27 NOTE — Evaluation (Signed)
Occupational Therapy Assessment and Plan  Patient Details  Name: MOHAMMED MCANDREW MRN: [id number] Date of Birth: May 20, 1961  OT Diagnosis: muscle weakness (generalized) Rehab Potential:  good ELOS:   12-14 days  Today's Date: 07/27/2013 Time:1030-1130  (60 min)   1st session Time Calculation (min): 60 min  Problem List:  Patient Active Problem List   Diagnosis Date Noted  . Hx of right BKA 07/26/2013  . Anemia 07/15/2013  . Hyperkalemia 07/15/2013  . Hyposmolality and/or hyponatremia 07/15/2013  . Acute respiratory failure 07/15/2013  . Mitral regurgitation 07/15/2013  . Oliguria 07/15/2013  . Bacteremia due to Staphylococcus aureus 07/15/2013  . Hyperchloremia 07/15/2013  . Acute kidney failure 07/14/2013  . Hypotension, unspecified 07/14/2013  . Acute systolic heart failure 07/12/2013  . Acute pulmonary edema 07/11/2013  . Pneumonia, organism unspecified 07/11/2013  . Hypoxemia 07/11/2013  . Hx of BKA 07/04/2013  . Cardiomyopathy, ischemic 07/03/2013  . Coronary atherosclerosis of native coronary artery 07/01/2013  . NSTEMI (non-ST elevated myocardial infarction) 06/26/2013  . AKI (acute kidney injury) 06/25/2013  . Foot abscess, right 06/25/2013  . Shock circulatory 06/25/2013  . Dehydration with hyponatremia 06/25/2013  . Leukocytosis, unspecified 06/25/2013  . Sepsis(995.91) 06/25/2013  . Aftercare following surgery of the circulatory system, NEC 02/11/2013  . Peripheral vascular disease, unspecified 12/31/2012  . Diabetes mellitus out of control 12/17/2012  . Essential hypertension, benign 12/17/2012  . Atherosclerotic peripheral vascular disease 12/17/2012  . Gangrene 12/08/2012  . Hypercholesteremia     Past Medical History:  Past Medical History  Diagnosis Date  . Hypertension   . Diabetes mellitus without complication ~2000    last HbA1c ~9  . Hypercholesteremia   . CVA (cerebral infarction) 2011    R hand deficit  . Chronic sinusitis   . Allergic  rhinitis   . Chronic cough   . Complication of anesthesia     couldn't swallow and talk  . Stroke    Past Surgical History:  Past Surgical History  Procedure Laterality Date  . 4th toe amputation Left Jan. 2014    4th toe in [place]  . Tonsillectomy and adenoidectomy    . Penile prosthesis implant    . I&d extremity Left 12/08/2012    Procedure: IRRIGATION AND DEBRIDEMENT EXTREMITY;  Surgeon: Kathryne Hitch, MD;  Location: [facility] OR;  Service: Orthopedics;  Laterality: Left;  . Amputation Left 12/08/2012    Procedure: AMPUTATION DIGIT;  Surgeon: Kathryne Hitch, MD;  Location: [facility] OR;  Service: Orthopedics;  Laterality: Left;  3rd toe amputation possible 5th toe amputation  . I&d extremity Left 12/11/2012    Procedure: REPEAT I&D LEFT FOOT;  Surgeon: Kathryne Hitch, MD;  Location: [facility] OR;  Service: Orthopedics;  Laterality: Left;  . Amputation Left 12/11/2012    Procedure: Amputation of 2nd Toe;  Surgeon: Kathryne Hitch, MD;  Location: [facility] OR;  Service: Orthopedics;  Laterality: Left;  . Bypass graft popliteal to tibial Left 12/13/2012    Procedure: BYPASS GRAFT POPLITEAL TO TIBIAL;  Surgeon: Nada Libman, MD;  Location: [facility] OR;  Service: Vascular;  Laterality: Left;  Left Popliteal to Posterior Tibial Bypass Graft with reversed saphenous vein graft  . Incision and drainage Right 06/25/2013    Procedure: INCISION AND DRAINAGE RIGHT FOOT;  Surgeon: Kathryne Hitch, MD;  Location: [facility] ORS;  Service: Orthopedics;  Laterality: Right;  . Amputation Right 06/29/2013    Procedure: AMPUTATION BELOW KNEE;  Surgeon: Nadara Mustard, MD;  Location: [facility] OR;  Service: Orthopedics;  Laterality: Right;  . Tee without cardioversion [name] 07/03/2013    Procedure: TRANSESOPHAGEAL ECHOCARDIOGRAM (TEE);  Surgeon: Laurey Morale, MD;  Location: [facility] ENDOSCOPY;  Service: Cardiovascular;  Laterality: [name];    Assessment & Plan Clinical Impression: History of uncontrolled diabetes mellitus  peripheral neuropathy, hypertension, CVA with residual right upper extremity weakness and a Charcot arthropathy RLEwith nonhealing ulcer and Charcot collapse right foot. Underwent I &D right foot. Underwent cardiac catheterization 06/28/2013 with findings of triple vessel CAD NSTEMI secondary to demand ischemia and mild LV systolic dysfunction. Ongoing ischemic changes RLE with RBKA 06/29/2013. TEE completed 07/03/2013 to rule out any vegetation that was negative with EF 45-50% . Adm to CIR on  07/03/2013. 07/11/2013 with increased shortness of breath and increased weight gain of 10 pounds. He received intravenous Lasix with improvement in shortness of breath. He developed hypotension 85/57 and was given normal saline 250 cc bolus. Patient also with a fall transferring from bed to bedside commode 07/11/2013. Noted bleeding from right BKA site with increased need for dressing changes and 2 staples in the lateral aspect of the incision had partially dehisced. Due to ongoing bouts of pulmonary edema as well his recent wound dehiscence patient was discharged to acute care services 07/14/2013 EKG showed sinus tachycardia nonspecific ST-T changes . A followup echocardiogram 07/15/2013 showed ejection fraction of 45% with decreased left ventricular diastolic function. Aspirin therapy. Still plan remains for CABG in the future. Hospital acute stay complicated by hyperkalemia as well as mild elevation in creatinine 1.78-2.8 and renal services consulted felt to be secondary to ischemic ATN and reason intravenous Lasix. His renal function normalized 1.5 and monitored. Wound care followup for nonhealing wound left foot from previous amputation of multiple toes in February 2014.  Has RUE swelling.  Venous Doppler showed no signs of DVT.   Patient transferred to CIR on 07/26/2013 .    Patient currently requires mod with basic self-care skills secondary to muscle weakness.  Prior to hospitalization, patient could complete BADL  with modified independent .  Patient will benefit from skilled intervention to increase independence with basic self-care skills prior to discharge home with care partner.  Anticipate patient will require intermittent supervision and follow up home health.  OT - End of Session Activity Tolerance: Tolerates 10 - 20 min activity with multiple rests Endurance Deficit: Yes OT Assessment Rehab Potential: Good Barriers to Discharge:  (pt getting ramp built; has 2 sons and wife for support) OT Patient demonstrates impairments in the following area(s): Balance;Edema;Endurance;Motor;Pain;Safety OT Basic ADL's Functional Problem(s): Grooming;Bathing;Dressing;Toileting OT Transfers Functional Problem(s): Toilet;Tub/Shower OT Additional Impairment(s): Fuctional Use of Upper Extremity OT Plan OT Intensity: Minimum of 1-2 x/day, 45 to 90 minutes OT Frequency: 5 out of 7 days OT Duration/Estimated Length of Stay: 12-14 days OT Treatment/Interventions: Balance/vestibular training;Cognitive remediation/compensation;Community reintegration;Discharge planning;DME/adaptive equipment instruction;Functional mobility training;Patient/family education;Psychosocial support;Self Care/advanced ADL retraining;Therapeutic Exercise;Therapeutic Activities;UE/LE Strength taining/ROM;UE/LE Coordination activities;Wheelchair propulsion/positioning;Neuromuscular re-education OT Self Feeding Anticipated Outcome(s): independent OT Basic Self-Care Anticipated Outcome(s): set up OT Toileting Anticipated Outcome(s): supervision OT Bathroom Transfers Anticipated Outcome(s): supervision  OT Recommendation Patient destination: Home Follow Up Recommendations: Home health OT;Outpatient OT Equipment Recommended: Tub/shower bench;3 in 1 bedside comode (dropped arm commode chair if he does not have) Equipment Details:  (needs dropped arm 3n1; tub bench)   Skilled Therapeutic Intervention 2nd session:  Time: 1330-1400  Pain:  none Individual session Addressed wc mobility, UE AROM, edema education for RUE.   Instructed pt on edema measures with retrograde  massage and using rolling table to place arm on for support.  Pt return demonstration.  Pt. Propelled wc using LUE/LLE about 75 feet with supervision.     OT Evaluation Precautions/Restrictions  Precautions Precautions: Fall Precaution Comments: R BKA Restrictions Weight Bearing Restrictions: Yes RLE Weight Bearing: Non weight bearing Other Position/Activity Restrictions: elevate operated limb, do not prop pillows under knee; try to position on LEFT side so that RIGHT hip can be in neutral/extension Pain Pain Assessment Pain Assessment: No/denies pain Pain Score: 0-No pain Home Living/Prior Functioning Home Living Available Help at Discharge: Available PRN/intermittently Type of Home: House Home Access: Level entry Entrance Stairs-Number of Steps: One step up located in car port, then threshold step into house (8 inch step in carport) Home Layout: Two level Alternate Level Stairs-Number of Steps: 1 step down into master bedroom, will utilize another area to sleep initially Alternate Level Stairs-Rails: None Additional Comments: pt has a rolling walker, wheelchair, shower chair, commode  Lives With: Spouse;Son;Daughter IADL History Homemaking Responsibilities: No Current License: Yes Mode of TransportationGames developer Leisure and Hobbies:  (sports, Bible study) Prior Function Level of Independence: Independent with transfers;Independent with gait  Able to Take Stairs?: Yes Driving: Yes Vocation:  (on medical leave from [place]) ADL   Vision/Perception  Vision - History Baseline Vision: Wears glasses only for reading Patient Visual Report: No change from baseline Vision - Assessment Eye Alignment: Within Functional Limits Praxis Praxis: Intact  Cognition Overall Cognitive Status: Within Functional Limits for tasks assessed Arousal/Alertness:  Awake/alert Orientation Level: Oriented X4 Attention: Selective Sustained Attention: Appears intact Selective Attention: Appears intact Memory: Appears intact Awareness: Appears intact Problem Solving: Impaired Problem Solving Impairment: Verbal complex;Functional complex Safety/Judgment: Appears intact Sensation Sensation Light Touch: Appears Intact (RUE appears intact) Light Touch Impaired Details: Impaired RLE;Impaired LLE Coordination Gross Motor Movements are Fluid and Coordinated: No Fine Motor Movements are Fluid and Coordinated: No Coordination and Movement Description:  (RUE Brunnstromn stage 4) Finger Nose Finger Test: decreased speed and accuracy in RUE secondary to previous CVA Motor  Motor Motor: Hemiplegia;Abnormal tone;Abnormal postural alignment and control Motor - Skilled Clinical Observations: R residual deficits from CVA in 2011 Mobility  Bed Mobility Bed Mobility: Rolling Right Rolling Right: 5: Supervision;With rail Right Sidelying to Sit: With rails;3: Mod assist Sitting - Scoot to Edge of Bed: 4: Min assist  Trunk/Postural Assessment  Cervical Assessment Cervical Assessment: Within Functional Limits Thoracic Assessment Thoracic Assessment: Within Functional Limits Lumbar Assessment Lumbar Assessment: Within Functional Limits Postural Control Postural Control: Deficits on evaluation Postural Limitations: flexed trunk in sitting and standing; slight R lateral trunk lean in standing, likely due to old CVA  Balance Balance Balance Assessed: Yes Static Sitting Balance Static Sitting - Balance Support: Bilateral upper extremity supported Static Sitting - Level of Assistance: 5: Stand by assistance Dynamic Sitting Balance Dynamic Sitting - Balance Support: Bilateral upper extremity supported;Feet supported;During functional activity (lateral weight shifts for toileting with mod assist) Dynamic Sitting - Level of Assistance: 3: Mod assist Extremity/Trunk  Assessment RUE Assessment RUE Assessment: Exceptions to [facility] RUE AROM (degrees) Overall AROM Right Upper Extremity: Deficits;Due to premorbid status RUE Strength Right Shoulder Flexion: 2/5 Right Elbow Flexion: 3-/5 Right Elbow Extension: 3-/5 Gross Grasp: Impaired LUE Assessment LUE Assessment: Within Functional Limits  FIM:  FIM - Bed/Chair Transfer Bed/Chair Transfer Assistive Devices: Bed rails;Arm rests Bed/Chair Transfer: 3: Supine > Sit: Mod A (lifting assist/Pt. 50-74%/lift 2 legs;2: Bed > Chair or W/C: Max A (lift and lower assist)  Refer to Care Plan for Long Term Goals  Recommendations for other services: None  Discharge Criteria: Patient will be discharged from OT if patient refuses treatment 3 consecutive times without medical reason, if treatment goals not met, if there is a change in medical status, if patient makes no progress towards goals or if patient is discharged from hospital.  The above assessment, treatment plan, treatment alternatives and goals were discussed and mutually agreed upon: by patient  Humberto Seals 07/27/2013, 5:33 PM

## 2013-07-27 NOTE — Progress Notes (Signed)
Patient ID: Albert [name], male   DOB: October 30, 1960, 52 y.o.   MRN: [id number]  6/21. 52 year old patient admitted to rehabilitation following a right BKA. Medical problems include hypertension and diabetes as well his peripheral vascular disease. Hospital course complicated by acute kidney injury and NSTEMI. He has a history of coronary artery disease and CABG is being considered post recovery for his triple-vessel disease. No major complaints today. He is asking about the IV lines present in the left arm and right IJ regions   Ranelle Oyster, MD Service: Physical Medicine and Rehabilitation Author Type: Physician    Filed: 07/26/2013  2:34 PM Note Time: 07/26/2013  2:29 PM           Physical Medicine and Rehabilitation Admission H&P   No chief complaint on file.   :   HPI: Albert [name] is a 52 y.o. 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 Admitted 06/25/2013 with nonhealing ulcer to the right foot and Charcot collapse right foot. Underwent irrigation and debridement of right foot plantar abscess 06/25/2013 per Dr. Magnus Ivan. Postoperatively with hypotension and tachycardia. Cardiology services consulted with findings of elevated troponin levels of 10. Echocardiogram with distal septal hypokinesis with overall low-normal LVEF. Elevated troponin levels are likely related to underlying sepsis. Underwent cardiac catheterization 06/28/2013 with findings of triple vessel CAD NSTEMI secondary to demand ischemia and mild LV systolic dysfunction. Patient placed on medical management with aspirin therapy. Patient with ongoing ischemic changes right lower extremity and underwent right below-knee amputation 06/29/2013 per Dr. Lajoyce Corners. Cardiothoracic surgery followup to consider CABG after recovery from  BKA. TEE completed 07/03/2013 to rule out any vegetation that was negative with ejection fraction 45-50% .patient currently remains on Ancef therapy per infectious disease for sepsis /MSSA bacteremia with plan for 28 day duration to be completed 07/26/2013. Physical therapy evaluation completed 06/30/2013 with recommendations of physical medicine rehabilitation consult to consider inpatient rehabilitation services. Patient was admitted inpatient rehabilitation services 07/03/2013. Mr. Rolfe was participating with therapies and noted 07/11/2013 with increased shortness of breath and increased weight gain of 10 pounds. He received intravenous Lasix with improvement in shortness of breath. He subsequently started on cefepime and vancomycin for concern of HCAP per critical care medicine. He developed hypotension 85/57 and was given normal saline 250 cc bolus. Patient also with a fall transferring from bed to bedside commode 07/11/2013. Noted bleeding from right BKA site with increased need for dressing changes and 2 staples in the lateral aspect of the incision had partially dehisced. Followup orthopedic services and no further surgical intervention required by continue dressing changes and monitor. Due to ongoing bouts of pulmonary edema as well his recent wound dehiscence patient was discharged to acute care services 07/14/2013 for medical management. Followup EKG showed sinus tachycardia nonspecific ST-T changes followup cardiology services and advised to continue to cycle troponin. A followup echocardiogram 07/15/2013 showed ejection fraction of 45% with decreased left ventricular diastolic function. Advised to continue medical management with aspirin therapy. Still plan remains for CABG in the future. Hospital acute stay complicated by hyperkalemia as well as mild elevation in creatinine 1.78-2.8 and renal services consulted felt to be secondary to ischemic ATN and reason intravenous Lasix. His renal function  normalized 1.5 and monitored. Wound care followup for nonhealing wound left foot from previous amputation of multiple toes in February  2014 and dressing changes as advised. Patient was some increased swelling right upper extremity venous Doppler study showed no signs of DVT. Mr. Cumbie therapies have been resumed and patient was felt to be a good candidate for inpatient rehabilitation services was admitted for comprehensive rehabilitation program today    Past Medical History    Diagnosis   Date    .   Hypertension      .   Diabetes mellitus without complication   ~2000        last HbA1c ~9    .   Hypercholesteremia      .   CVA (cerebral infarction)   2011        R hand deficit    .   Chronic sinusitis      .   Allergic rhinitis      .   Chronic cough      .   Complication of anesthesia          couldn't swallow and talk    .   Stroke          Past Surgical History    Procedure   Laterality   Date    .   4th toe amputation   Left   Jan. 2014        4th toe in [place]    .   Tonsillectomy and adenoidectomy        .   Penile prosthesis implant        .   I&d extremity   Left   12/08/2012        Procedure: IRRIGATION AND DEBRIDEMENT EXTREMITY; Surgeon: Kathryne Hitch, MD; Location: [facility] OR; Service: Orthopedics; Laterality: Left;    .   Amputation   Left   12/08/2012        Procedure: AMPUTATION DIGIT; Surgeon: Kathryne Hitch, MD; Location: [facility] OR; Service: Orthopedics; Laterality: Left; 3rd toe amputation possible 5th toe amputation    .   I&d extremity   Left   12/11/2012        Procedure: REPEAT I&D LEFT FOOT; Surgeon: Kathryne Hitch, MD; Location: [facility] OR; Service: Orthopedics; Laterality: Left;    .   Amputation   Left   12/11/2012        Procedure: Amputation of 2nd Toe; Surgeon: Kathryne Hitch, MD; Location: [facility] OR; Service: Orthopedics; Laterality: Left;    .   Bypass graft popliteal to tibial   Left   12/13/2012        Procedure: BYPASS GRAFT POPLITEAL TO  TIBIAL; Surgeon: Nada Libman, MD; Location: [facility] OR; Service: Vascular; Laterality: Left; Left Popliteal to Posterior Tibial Bypass Graft with reversed saphenous vein graft    .   Incision and drainage   Right   06/25/2013        Procedure: INCISION AND DRAINAGE RIGHT FOOT; Surgeon: Kathryne Hitch, MD; Location: [facility] ORS; Service: Orthopedics; Laterality: Right;    .   Amputation   Right   06/29/2013        Procedure: AMPUTATION BELOW KNEE; Surgeon: Nadara Mustard, MD; Location: [facility] OR; Service: Orthopedics; Laterality: Right;    .   Tee without cardioversion   [name]   07/03/2013        Procedure: TRANSESOPHAGEAL ECHOCARDIOGRAM (TEE); Surgeon: Laurey Morale, MD; Location: [facility] ENDOSCOPY; Service: Cardiovascular; Laterality: [name];        Family History    Problem   Relation  Age of Onset    .   Diabetes        .   Pancreatic cancer   Mother      .   Diabetes   Mother      .   Hyperlipidemia   Mother      .   Breast cancer   Sister      .   Depression   Maternal Grandmother      .   Heart disease   Maternal Grandmother      .   Depression   Maternal Grandfather      .   Heart disease   Maternal Grandfather      .   Depression   Paternal Grandmother      .   Heart disease   Paternal Grandmother      .   Depression   Paternal Grandfather      .   Heart disease   Paternal Grandfather         Social History: reports that he has never smoked. He has never used smokeless tobacco. He reports that he does not drink alcohol or use illicit drugs.   Allergies:   Allergies    Allergen   Reactions    .   Amoxicillin   Nausea And Vomiting        Medications Prior to Admission    Medication   Sig   Dispense   Refill    .   aspirin EC 81 MG tablet   Take 81 mg by mouth daily.        .   [EXPIRED] ceFAZolin (ANCEF) 2-3 GM-% SOLR   Inject 50 mLs (2 g total) into the vein every 8 (eight) hours.   50 mL   0    .   docusate sodium 100 MG CAPS   Take 100 mg by mouth 2 (two) times daily.   10 capsule    0    .   fentaNYL (SUBLIMAZE) 0.05 MG/ML injection   Inject 1-2 mLs (50-100 mcg total) into the vein every 2 (two) hours as needed for severe pain (or RASS > 0).   2 mL   0    .   glyBURIDE-metformin (GLUCOVANCE) 5-500 MG per tablet   Take 2 tablets by mouth 2 (two) times daily.        .   insulin aspart (NOVOLOG FLEXPEN) 100 UNIT/ML injection   Inject 12 Units into the skin 3 (three) times daily. Take every time he eats per patient        .   insulin detemir (LEVEMIR) 100 UNIT/ML injection   Inject 0.25 mLs (25 Units total) into the skin every evening.   10 mL   0    .   irbesartan-hydrochlorothiazide (AVALIDE) 300-12.5 MG per tablet   Take 1 tablet by mouth daily.        Marland Kitchen   lisinopril (PRINIVIL,ZESTRIL) 5 MG tablet   Take 1 tablet (5 mg total) by mouth daily.        .   metoCLOPramide (REGLAN) 5 MG tablet   Take 1-2 tablets (5-10 mg total) by mouth every 8 (eight) hours as needed (if ondansetron (ZOFRAN) ineffective.).        Marland Kitchen   metoprolol (LOPRESSOR) 50 MG tablet   Take 1 tablet (50 mg total) by mouth 2 (two) times daily.        .   ondansetron (ZOFRAN) 8  MG tablet   Take by mouth every 8 (eight) hours as needed for nausea. For upset stomach        .   oxyCODONE (OXY IR/ROXICODONE) 5 MG immediate release tablet   Take 1 tablet (5 mg total) by mouth every 4 (four) hours as needed.   30 tablet   0    .   pantoprazole (PROTONIX) 40 MG tablet   Take 1 tablet (40 mg total) by mouth daily.        .   pregabalin (LYRICA) 75 MG capsule   Take 75 mg by mouth 2 (two) times daily.        .   simvastatin (ZOCOR) 40 MG tablet   Take 40 mg by mouth every evening.           Home:   Home Living   Family/patient expects to be discharged to:: Private residence   Living Arrangements: Spouse/significant other   Available Help at Discharge: Available PRN/intermittently   Type of Home: House   Home Access: Level entry   Entrance Stairs-Number of Steps: One step from garage into car port, then level entry into  home   Home Layout: Two level   Alternate Level Stairs-Number of Steps: 1 step between living areas   Alternate Level Stairs-Rails: None   Home Equipment: Walker - 2 wheels;Wheelchair - manual   Additional Comments: Patient owns RW, shower chair, and wheelchair.   Lives With: Spouse;Family   Functional History:     Functional Status:   Mobility:   Bed Mobility   Bed Mobility: Rolling Right;Rolling Left   Rolling Right: 4: Min guard;With rail   Rolling Left: 4: Min guard;With rail   Right Sidelying to Sit: HOB elevated;With rails;1: +2 Total assist   Right Sidelying to Sit: Patient Percentage: 50%   Supine to Sit: 1: +2 Total assist;With rails;HOB elevated   Supine to Sit: Patient Percentage: 60%   Sitting - Scoot to Edge of Bed: 3: Mod assist   Sit to Supine: 2: Max assist;HOB flat   Transfers   Transfers: Lateral/Scoot Transfers   Sit to Stand: Not tested (comment)   Sit to Stand: Patient Percentage: 30%   Stand to Sit: Not tested (comment)   Stand to Sit: Patient Percentage: 40%   Squat Pivot Transfers: Not tested (comment)   Lateral/Scoot Transfers: 1: +2 Total assist;With armrests removed;From elevated surface   Lateral Transfers: Patient Percentage: 50%   Transfer via Lift Equipment: [place]   Ambulation/Gait   Ambulation/Gait Assistance: Not tested (comment)   Stairs: No   Wheelchair Mobility   Wheelchair Mobility: No   ADL:   ADL   Grooming: Performed;Wash/dry face;Set up   Where Assessed - Grooming: Supine, head of bed up   Upper Body Bathing: Simulated;Minimal assistance   Where Assessed - Upper Body Bathing: Supine, head of bed up   Lower Body Bathing: Simulated;Maximal assistance   Where Assessed - Lower Body Bathing: Rolling right and/or left;Supine, head of bed up   Upper Body Dressing: Simulated;Minimal assistance   Where Assessed - Upper Body Dressing: Supine, head of bed up   Lower Body Dressing: Simulated;Maximal assistance   Where Assessed - Lower  Body Dressing: Supine, head of bed up;Rolling right and/or left   Cognition:   Cognition   Overall Cognitive Status: Within Functional Limits for tasks assessed   Orientation Level: Oriented X4   Cognition   Arousal/Alertness: Awake/alert   Behavior During Therapy: Flat affect   Overall Cognitive  Status: Within Functional Limits for tasks assessed   Physical Exam:   Blood pressure 113/70, pulse 106, temperature 98.3 F (36.8 C), temperature source Oral, resp. rate 18, height 5\' 10"  (1.778 m), weight 116.6 kg (257 lb 0.9 oz), SpO2 93.00%.    Physical Exam   Constitutional: He is oriented to person, place, and time.   HENT:  Right internal jugular catheter noted Head: Normocephalic.dentition fair. Oral mucosa pink and moist   Eyes: EOM are normal.   Neck: Normal range of motion. Neck supple. No thyromegaly present.   Cardiovascular: Normal rate. No murmur or gallop. LLE with 1+ edema. Chronic vascular changes.   Pulmonary/Chest: Effort normal and breath sounds normal. No respiratory distress. No wheezes   Abdominal: Soft. Bowel sounds are normal. He exhibits no distension.  Neurological: He is alert and oriented to person, place, and time. CN grossly intact. DTR's decreased.  Pt is alert and appropriate. Right amputation site is dressed without odor;  IV access left arm; left SCD in place  GLUCOSE, CAPILLARY Status: None      Collection Time      07/23/13 12:04 PM    Result   Value   Range      Glucose-Capillary   99   70 - 99 mg/dL    GLUCOSE, CAPILLARY Status: Abnormal      Collection Time      07/23/13 4:27 PM    Result   Value   Range      Glucose-Capillary   138 (*)   70 - 99 mg/dL    GLUCOSE, CAPILLARY Status: Abnormal      Collection Time      07/23/13 9:42 PM    Result   Value   Range      Glucose-Capillary   138 (*)   70 - 99 mg/dL    BASIC METABOLIC PANEL Status: Abnormal      Collection Time      07/24/13 5:30 AM    Result   Value   Range      Sodium   143   135  - 145 mEq/L      Potassium   3.7   3.5 - 5.1 mEq/L      Chloride   102   96 - 112 mEq/L      CO2   33 (*)   19 - 32 mEq/L      Glucose, Bld   84   70 - 99 mg/dL      BUN   54 (*)   6 - 23 mg/dL      Creatinine, Ser   1.54 (*)   0.50 - 1.35 mg/dL      Calcium   8.0 (*)   8.4 - 10.5 mg/dL      GFR calc non Af Amer   51 (*)   >90 mL/min      GFR calc Af Amer   59 (*)   >90 mL/min      Comment:   (NOTE)        The eGFR has been calculated using the CKD EPI equation.        This calculation has not been validated in all clinical situations.        eGFR's persistently <90 mL/min signify possible Chronic Kidney        Disease.    GLUCOSE, CAPILLARY Status: None      Collection Time      07/24/13 8:01 AM  Result   Value   Range      Glucose-Capillary   71   70 - 99 mg/dL    GLUCOSE, CAPILLARY Status: Abnormal      Collection Time      07/24/13 11:53 AM    Result   Value   Range      Glucose-Capillary   150 (*)   70 - 99 mg/dL    GLUCOSE, CAPILLARY Status: Abnormal      Collection Time      07/24/13 4:49 PM    Result   Value   Range      Glucose-Capillary   179 (*)   70 - 99 mg/dL      Comment 1   Notify RN      GLUCOSE, CAPILLARY Status: Abnormal      Collection Time      07/24/13 9:03 PM    Result   Value   Range      Glucose-Capillary   150 (*)   70 - 99 mg/dL      Comment 1   Notify RN      CBC Status: Abnormal      Collection Time      07/25/13 5:30 AM    Result   Value   Range      WBC   5.8   4.0 - 10.5 K/uL      RBC   2.93 (*)   4.22 - 5.81 MIL/uL      Hemoglobin   7.7 (*)   13.0 - 17.0 g/dL      HCT   16.1 (*)   09.6 - 52.0 %      MCV   85.3   78.0 - 100.0 fL      MCH   26.3   26.0 - 34.0 pg      MCHC   30.8   30.0 - 36.0 g/dL      RDW   04.5 (*)   40.9 - 15.5 %      Platelets   183   150 - 400 K/uL    GLUCOSE, CAPILLARY Status: Abnormal      Collection Time      07/25/13 7:58 AM    Result   Value   Range      Glucose-Capillary   146 (*)   70 - 99 mg/dL       Comment 1   Documented in Chart        Comment 2   Notify RN      Patient Vitals for the past 24 hrs:  BP Temp Temp src Pulse Resp SpO2 Weight  07/27/13 0611 122/88 mmHg 98.2 F (36.8 C) Oral 97 - 96 % -  07/27/13 0458 127/86 mmHg 97.7 F (36.5 C) - 93 18 91 % 115.395 kg (254 lb 6.4 oz)  07/26/13 1500 110/78 mmHg 98.4 F (36.9 C) Oral 92 18 96 % -  07/26/13 1315 95/67 mmHg 98.2 F (36.8 C) Oral 91 20 95 % -     Intake/Output Summary (Last 24 hours) at 07/27/13 0827 Last data filed at 07/26/13 1700  Gross per 24 hour  Intake    360 ml  Output   1576 ml  Net  -1216 ml    CBG (last 3)   Recent Labs  07/26/13 1717 07/26/13 2144 07/27/13 0718  GLUCAP 135* 130* 148*      Medical Problem List and Plan:   1. right BKA  due to her Charcot foot/abscess complicated by wound dehiscence after fall, prior left CVA with residual right-sided weakness   2. DVT Prophylaxis/Anticoagulation: SCDs left lower extremity   3. Pain Management: Presently Tylenol only. Will monitor with increased therapy   4. Mood: Xanax 0.25 mg twice a day as needed. Provide emotional support   5. Neuropsych: This patient is capable of making decisions on his own behalf.   6. Recent multi-toe amputation left foot February 2014.  dressing changes followed by wound care nurse. Increase frequency as needed to keep wounds clean and dry. 7. ID. Sepsis/MSSA bacteremia. Ancef completes today- monitor clinically going forward 8. CAD/NSTEMIx2. Continue aspirin therapy. Followup cardiothoracic surgery to consider CABG in the near future   9. Diastolic congestive heart failure. Continue intravenous Lasix therapy as directed for now. Cardiology services will continue to follow and adjust Lasix as needed. Check daily weights with I's and O's.   10. Diabetes mellitus peripheral neuropathy. Hemoglobin A1c 8.2 Levemir 23 units each bedtime. Check blood sugars a.c. and at bedtime   presently nice glycemic control 11. Hypertension.  Hydralazine 25 mg every 6 hours, Coreg 25 mg twice a day. Monitor with increased mobility   12. Acute renal failure. Renal function improved with latest creatinine 1.54. Followup chemistries   13. Acute blood loss anemia. Latest hemoglobin 7.7. Transfuse if symptomatic    Ranelle Oyster, MD, [facility] Physical Medicine & Rehabilitation    07/25/2013

## 2013-07-27 NOTE — Evaluation (Signed)
Physical Therapy Assessment and Plan  Patient Details  Name: Albert [name] MRN: [id number] Date of Birth: Sep 05, 1961  PT Diagnosis: Difficulty walking and Muscle weakness Rehab Potential: Good ELOS: 12-14 days   Today's Date: 07/27/2013 Time: 0800-0903 Time Calculation (min): 63 min  Problem List:  Patient Active Problem List   Diagnosis Date Noted  . Hx of right BKA 07/26/2013  . Anemia 07/15/2013  . Hyperkalemia 07/15/2013  . Hyposmolality and/or hyponatremia 07/15/2013  . Acute respiratory failure 07/15/2013  . Mitral regurgitation 07/15/2013  . Oliguria 07/15/2013  . Bacteremia due to Staphylococcus aureus 07/15/2013  . Hyperchloremia 07/15/2013  . Acute kidney failure 07/14/2013  . Hypotension, unspecified 07/14/2013  . Acute systolic heart failure 07/12/2013  . Acute pulmonary edema 07/11/2013  . Pneumonia, organism unspecified 07/11/2013  . Hypoxemia 07/11/2013  . Hx of BKA 07/04/2013  . Cardiomyopathy, ischemic 07/03/2013  . Coronary atherosclerosis of native coronary artery 07/01/2013  . NSTEMI (non-ST elevated myocardial infarction) 06/26/2013  . AKI (acute kidney injury) 06/25/2013  . Foot abscess, right 06/25/2013  . Shock circulatory 06/25/2013  . Dehydration with hyponatremia 06/25/2013  . Leukocytosis, unspecified 06/25/2013  . Sepsis(995.91) 06/25/2013  . Aftercare following surgery of the circulatory system, NEC 02/11/2013  . Peripheral vascular disease, unspecified 12/31/2012  . Diabetes mellitus out of control 12/17/2012  . Essential hypertension, benign 12/17/2012  . Atherosclerotic peripheral vascular disease 12/17/2012  . Gangrene 12/08/2012  . Hypercholesteremia     Past Medical History:  Past Medical History  Diagnosis Date  . Hypertension   . Diabetes mellitus without complication ~2000    last HbA1c ~9  . Hypercholesteremia   . CVA (cerebral infarction) 2011    R hand deficit  . Chronic sinusitis   . Allergic rhinitis   .  Chronic cough   . Complication of anesthesia     couldn't swallow and talk  . Stroke    Past Surgical History:  Past Surgical History  Procedure Laterality Date  . 4th toe amputation Left Jan. 2014    4th toe in [place]  . Tonsillectomy and adenoidectomy    . Penile prosthesis implant    . I&d extremity Left 12/08/2012    Procedure: IRRIGATION AND DEBRIDEMENT EXTREMITY;  Surgeon: Kathryne Hitch, MD;  Location: [facility] OR;  Service: Orthopedics;  Laterality: Left;  . Amputation Left 12/08/2012    Procedure: AMPUTATION DIGIT;  Surgeon: Kathryne Hitch, MD;  Location: [facility] OR;  Service: Orthopedics;  Laterality: Left;  3rd toe amputation possible 5th toe amputation  . I&d extremity Left 12/11/2012    Procedure: REPEAT I&D LEFT FOOT;  Surgeon: Kathryne Hitch, MD;  Location: [facility] OR;  Service: Orthopedics;  Laterality: Left;  . Amputation Left 12/11/2012    Procedure: Amputation of 2nd Toe;  Surgeon: Kathryne Hitch, MD;  Location: [facility] OR;  Service: Orthopedics;  Laterality: Left;  . Bypass graft popliteal to tibial Left 12/13/2012    Procedure: BYPASS GRAFT POPLITEAL TO TIBIAL;  Surgeon: Nada Libman, MD;  Location: [facility] OR;  Service: Vascular;  Laterality: Left;  Left Popliteal to Posterior Tibial Bypass Graft with reversed saphenous vein graft  . Incision and drainage Right 06/25/2013    Procedure: INCISION AND DRAINAGE RIGHT FOOT;  Surgeon: Kathryne Hitch, MD;  Location: [facility] ORS;  Service: Orthopedics;  Laterality: Right;  . Amputation Right 06/29/2013    Procedure: AMPUTATION BELOW KNEE;  Surgeon: Nadara Mustard, MD;  Location: [facility] OR;  Service: Orthopedics;  Laterality: Right;  .  Tee without cardioversion [name] 07/03/2013    Procedure: TRANSESOPHAGEAL ECHOCARDIOGRAM (TEE);  Surgeon: Laurey Morale, MD;  Location: [facility] ENDOSCOPY;  Service: Cardiovascular;  Laterality: [name];    Assessment & Plan Clinical Impression: Albert [name] is a 52 y.o. right-handed male with history  of uncontrolled diabetes mellitus peripheral neuropathy, hypertension, CVA with residual right upper extremity weakness and a Charcot arthropathy right lower extremity and multiple amputations of toes left foot February 2014. Admitted 06/25/2013 with nonhealing ulcer to the right foot and Charcot collapse right foot. Underwent irrigation and debridement of right foot plantar abscess 06/25/2013 per Dr. Magnus [name]. Postoperatively with hypotension and tachycardia. Cardiology services consulted with findings of elevated troponin levels of 10. Echocardiogram with distal septal hypokinesis with overall low-normal LVEF. Elevated troponin levels are likely related to underlying sepsis. Underwent cardiac catheterization 06/28/2013 with findings of triple vessel CAD NSTEMI secondary to demand ischemia and mild LV systolic dysfunction. Patient placed on medical management with aspirin therapy. Patient with ongoing ischemic changes right lower extremity and underwent right below-knee amputation 06/29/2013 per Dr. Lajoyce [name]. Cardiothoracic surgery followup to consider CABG after recovery from BKA. TEE completed 07/03/2013 to rule out any vegetation that was negative with ejection fraction 45-50% .patient currently remains on Ancef therapy per infectious disease for sepsis /MSSA bacteremia with plan for 28 day duration to be completed 07/26/2013. Physical therapy evaluation completed 06/30/2013 with recommendations of physical medicine rehabilitation consult to consider inpatient rehabilitation services. Patient was admitted inpatient rehabilitation services 07/03/2013. Albert [name] was participating with therapies and noted 07/11/2013 with increased shortness of breath and increased weight gain of 10 pounds. He received intravenous Lasix with improvement in shortness of breath. He subsequently started on cefepime and vancomycin for concern of HCAP per critical care medicine. He developed hypotension 85/57 and was given normal saline 250  cc bolus. Patient also with a fall transferring from bed to bedside commode 07/11/2013. Noted bleeding from right BKA site with increased need for dressing changes and 2 staples in the lateral aspect of the incision had partially dehisced. Followup orthopedic services and no further surgical intervention required by continue dressing changes and monitor. Due to ongoing bouts of pulmonary edema as well his recent wound dehiscence patient was discharged to acute care services 07/14/2013 for medical management. Followup EKG showed sinus tachycardia nonspecific ST-T changes followup cardiology services and advised to continue to cycle troponin. A followup echocardiogram 07/15/2013 showed ejection fraction of 45% with decreased left ventricular diastolic function. Advised to continue medical management with aspirin therapy. Still plan remains for CABG in the future. Hospital acute stay complicated by hyperkalemia as well as mild elevation in creatinine 1.78-2.8 and renal services consulted felt to be secondary to ischemic ATN and reason intravenous Lasix. His renal function normalized 1.5 and monitored. Wound care followup for nonhealing wound left foot from previous amputation of multiple toes in February 2014 and dressing changes as advised. Patient was some increased swelling right upper extremity venous Doppler study showed no signs of DVT.   Patient transferred to CIR on 07/26/2013 .   Patient currently requires max with mobility secondary to muscle weakness, decreased cardiorespiratoy endurance and decreased oxygen support and abnormal tone.  Prior to hospitalization, patient was independent  with mobility and lived with Spouse;Son;Daughter in a House home.  Home access is One step up located in car port, then threshold step into house (8 inch step in carport)Level entry.  Patient will benefit from skilled PT intervention to maximize safe functional mobility, minimize fall  risk and decrease caregiver burden  for planned discharge home with intermittent assist.  Anticipate patient will benefit from follow up HH at discharge.  PT - End of Session Activity Tolerance: Tolerates 30+ min activity with multiple rests Endurance Deficit: Yes PT Assessment Rehab Potential: Good Barriers to Discharge: Inaccessible home environment;Decreased caregiver support PT Patient demonstrates impairments in the following area(s): Balance;Endurance;Motor;Pain;Safety PT Transfers Functional Problem(s): Bed Mobility;Bed to Chair;Car PT Locomotion Functional Problem(s): Wheelchair Mobility;Ambulation;Stairs PT Plan PT Intensity: Minimum of 1-2 x/day ,45 to 90 minutes PT Frequency: 5 out of 7 days PT Duration Estimated Length of Stay: 12-14 days PT Treatment/Interventions: Ambulation/gait training;Balance/vestibular training;Community reintegration;Discharge planning;DME/adaptive equipment instruction;Functional electrical stimulation;Functional mobility training;Pain management;Neuromuscular re-education;Patient/family education;Therapeutic Exercise;UE/LE Coordination activities;Therapeutic Activities;Stair training;UE/LE Strength taining/ROM;Wheelchair propulsion/positioning;Splinting/orthotics PT Transfers Anticipated Outcome(s): mod I transfers PT Locomotion Anticipated Outcome(s): S wheelchair propulsion, no stair goal, no gait goal PT Recommendation Recommendations for Other Services: Neuropsych consult Follow Up Recommendations: Home health PT Patient destination: Home Equipment Recommended: Rolling walker with 5" wheels;Wheelchair (measurements);Sliding board  Skilled Therapeutic Intervention PT evaluation completed and treatment plan initiated. Pt will contact wife to have her bring a darco post op shoe for L LE. Dicussed goals and plan of care with pt, pt verbalized understanding. Pt was able to propel w/c about 50 feet with L LE and B UE with S and verbal cues. Pt transferred squat pivot to the left side with  mod-max A. Sliding board was left in room to assist with transfers.   PT Evaluation Precautions/Restrictions Precautions Precautions: Fall Precaution Comments: R BKA Restrictions Weight Bearing Restrictions: Yes Other Position/Activity Restrictions: elevate operated limb, do not prop pillows under knee; try to position on LEFT side so that RIGHT hip can be in neutral/extension General Pain Pain Assessment Pain Assessment: No/denies pain Home Living/Prior Functioning Home Living Available Help at Discharge: Available PRN/intermittently Type of Home: House Entrance Stairs-Number of Steps: One step up located in car port, then threshold step into house Alternate Level Stairs-Number of Steps: 1 step down into master bedroom, will utilize another area to sleep initially Additional Comments: pt has a rolling walker, wheelchair, shower chair, commode  Lives With: Spouse;Son;Daughter Prior Function Level of Independence: Independent with transfers;Independent with gait  Able to Take Stairs?: Yes Driving: Yes Vision/Perception  Vision - History Baseline Vision: Wears glasses only for reading Patient Visual Report: No change from baseline  Cognition Overall Cognitive Status: Within Functional Limits for tasks assessed Sustained Attention: Appears intact Memory: Appears intact Awareness: Appears intact Safety/Judgment: Appears intact Sensation Sensation Light Touch: Impaired by gross assessment Light Touch Impaired Details: Impaired RLE;Impaired LLE Motor  Motor Motor: Hemiplegia;Abnormal tone;Abnormal postural alignment and control Motor - Skilled Clinical Observations: R residual deficits from CVA in 2011  Mobility Bed Mobility Bed Mobility: Rolling Right Rolling Right: 5: Supervision;With rail Right Sidelying to Sit: With rails;3: Mod assist Sitting - Scoot to Edge of Bed: 4: Insurance account manager Transfers: 2: Max Geneticist, molecular Ambulation/Gait  Assistance: Not tested (comment) Stairs / Additional Locomotion Stairs: No Naval architect Assistance: 5: Investment banker, operational: Both upper extremities;Left lower extremity Distance: 50 feet  Trunk/Postural Assessment  Postural Control Postural Control: Deficits on evaluation  Balance Static Sitting Balance Static Sitting - Balance Support: Feet unsupported;Left upper extremity supported Static Sitting - Level of Assistance: 5: Stand by assistance Extremity Assessment  B UEs refer to OT evaluation RLE Assessment RLE Assessment: Exceptions to [facility] RLE AROM (degrees) Overall AROM Right Lower Extremity: Within functional limits  for tasks assessed (s/p R BKA) RLE Strength RLE Overall Strength: Deficits RLE Overall Strength Comments: grossly 3/5 hip flex, ext, abd, add, knee flex/ext LLE Assessment LLE Assessment: Exceptions to WFL LLE AROM (degrees) Overall AROM Left Lower Extremity: Within functional limits for tasks assessed LLE Strength LLE Overall Strength: Deficits LLE Overall Strength Comments: grossly 3+/5 throughout  FIM:  FIM - Bed/Chair Transfer Bed/Chair Transfer Assistive Devices: Bed rails;Arm rests Bed/Chair Transfer: 3: Supine > Sit: Mod A (lifting assist/Pt. 50-74%/lift 2 legs;2: Bed > Chair or W/C: Max A (lift and lower assist) FIM - Locomotion: Wheelchair Distance: 50 feet Locomotion: Wheelchair: 1: Travels less than 50 ft with supervision, cueing or coaxing FIM - Locomotion: Ambulation Ambulation/Gait Assistance: Not tested (comment) Locomotion: Ambulation: 0: Activity did not occur FIM - Locomotion: Stairs Locomotion: Stairs: 0: Activity did not occur   Refer to Care Plan for Long Term Goals  Recommendations for other services: Neuropsych  Discharge Criteria: Patient will be discharged from PT if patient refuses treatment 3 consecutive times without medical reason, if treatment goals not met, if there is a change in medical  status, if patient makes no progress towards goals or if patient is discharged from hospital.  The above assessment, treatment plan, treatment alternatives and goals were discussed and mutually agreed upon: by patient  Rayford Halsted 07/27/2013, 12:31 PM

## 2013-07-27 NOTE — Progress Notes (Signed)
Physical Therapy Session Note  Patient Details  Name: Albert [name] MRN: [id number] Date of Birth: December 18, 1960  Today's Date: 07/27/2013 Time: 1400-1440 Time Calculation (min): 40 min  Short Term Goals: Week 1:  PT Short Term Goal 1 (Week 1): Pt will roll L with side rail, head of bed flat and S.  PT Short Term Goal 2 (Week 1): Pt will roll R with side rail, head of bed flat and min A.  PT Short Term Goal 3 (Week 1): Pt will transfer supine to edge of bed, edge of bed to supine with min A.  PT Short Term Goal 4 (Week 1): Pt will transfer bed to chair, chair to bed with or without sliding board and min A.  PT Short Term Goal 5 (Week 1): Pt will propel w/c about 50 feet with L UE and LE and S.   Skilled Therapeutic Interventions/Progress Updates:  Pt was seen bedside in the pm. Pt propelled about 30 feet with UEs and L LE with S to occasional min A. Placed theraband on L rim to help with grip of L UE. Performed B LEs exercises seated in w/c , to increase LE strength. Pt transported back to room. Pt transferred w/c to edge of bed to L side with sliding board and mod A. Pt transferred edge of bed to supine with mod A. Pt moved up in bed with bed controls and min A.   Therapy Documentation Precautions:  Precautions Precautions: Fall Precaution Comments: R BKA Restrictions Weight Bearing Restrictions: Yes RLE Weight Bearing: Non weight bearing Other Position/Activity Restrictions: elevate operated limb, do not prop pillows under knee; try to position on LEFT side so that RIGHT hip can be in neutral/extension General:   Pain: No c/o pain.   See FIM for current functional status  Therapy/Group: Individual Therapy  Albert [name] 07/27/2013, 3:33 PM

## 2013-07-28 DIAGNOSIS — N179 Acute kidney failure, unspecified: Secondary | ICD-10-CM

## 2013-07-28 LAB — GLUCOSE, CAPILLARY
Glucose-Capillary: 111 mg/dL — ABNORMAL HIGH (ref 70–99)
Glucose-Capillary: 125 mg/dL — ABNORMAL HIGH (ref 70–99)
Glucose-Capillary: 125 mg/dL — ABNORMAL HIGH (ref 70–99)
Glucose-Capillary: 146 mg/dL — ABNORMAL HIGH (ref 70–99)

## 2013-07-28 MED ORDER — MENTHOL 3 MG MT LOZG
1.0000 | LOZENGE | OROMUCOSAL | Status: DC | PRN
  Administered 2013-07-28: 3 mg via ORAL
  Filled 2013-07-28: qty 9

## 2013-07-28 NOTE — Progress Notes (Signed)
Patient ID: Albert [name], male   DOB: July 21, 1961, 52 y.o.   MRN: [id number]  Patient ID: Albert [name], male   DOB: 20-Dec-1960, 52 y.o.   MRN: [id number]  13/73. 52 year old patient admitted to rehabilitation following a right BKA. Medical problems include hypertension and diabetes as well his peripheral vascular disease. Hospital course complicated by acute kidney injury and NSTEMI. He has a history of coronary artery disease and CABG is being considered post recovery for his triple-vessel disease. No major complaints today. He is asking about the IV lines present in the left arm and right IJ regions    Past Medical History    Diagnosis   Date    .   Hypertension      .   Diabetes mellitus without complication   ~2000        last HbA1c ~9    .   Hypercholesteremia      .   CVA (cerebral infarction)   2011        R hand deficit    .   Chronic sinusitis      .   Allergic rhinitis      .   Chronic cough      .   Complication of anesthesia          couldn't swallow and talk    .   Stroke          Past Surgical History    Procedure   Laterality   Date    .   4th toe amputation   Left   Jan. 2014        4th toe in [place]    .   Tonsillectomy and adenoidectomy        .   Penile prosthesis implant        .   I&d extremity   Left   12/08/2012        Procedure: IRRIGATION AND DEBRIDEMENT EXTREMITY; Surgeon: Kathryne Hitch, MD; Location: [facility] OR; Service: Orthopedics; Laterality: Left;    .   Amputation   Left   12/08/2012        Procedure: AMPUTATION DIGIT; Surgeon: Kathryne Hitch, MD; Location: [facility] OR; Service: Orthopedics; Laterality: Left; 3rd toe amputation possible 5th toe amputation    .   I&d extremity   Left   12/11/2012        Procedure: REPEAT I&D LEFT FOOT; Surgeon: Kathryne Hitch, MD; Location: [facility] OR; Service: Orthopedics; Laterality: Left;    .   Amputation   Left   12/11/2012        Procedure: Amputation of 2nd Toe; Surgeon: Kathryne Hitch, MD;  Location: [facility] OR; Service: Orthopedics; Laterality: Left;    .   Bypass graft popliteal to tibial   Left   12/13/2012        Procedure: BYPASS GRAFT POPLITEAL TO TIBIAL; Surgeon: Nada Libman, MD; Location: [facility] OR; Service: Vascular; Laterality: Left; Left Popliteal to Posterior Tibial Bypass Graft with reversed saphenous vein graft    .   Incision and drainage   Right   06/25/2013        Procedure: INCISION AND DRAINAGE RIGHT FOOT; Surgeon: Kathryne Hitch, MD; Location: [facility] ORS; Service: Orthopedics; Laterality: Right;    .   Amputation   Right   06/29/2013        Procedure: AMPUTATION BELOW KNEE; Surgeon: Nadara Mustard, MD; Location: [facility] OR; Service: Orthopedics; Laterality: Right;    .  Tee without cardioversion   [name]   07/03/2013        Procedure: TRANSESOPHAGEAL ECHOCARDIOGRAM (TEE); Surgeon: Laurey Morale, MD; Location: [facility] ENDOSCOPY; Service: Cardiovascular; Laterality: [name];        Family History    Problem   Relation   Age of Onset    .   Diabetes        .   Pancreatic cancer   Mother      .   Diabetes   Mother      .   Hyperlipidemia   Mother      .   Breast cancer   Sister      .   Depression   Maternal Grandmother      .   Heart disease   Maternal Grandmother      .   Depression   Maternal Grandfather      .   Heart disease   Maternal Grandfather      .   Depression   Paternal Grandmother      .   Heart disease   Paternal Grandmother      .   Depression   Paternal Grandfather      .   Heart disease   Paternal Grandfather         Social History: reports that he has never smoked. He has never used smokeless tobacco. He reports that he does not drink alcohol or use illicit drugs.   Allergies:   Allergies    Allergen   Reactions    .   Amoxicillin   Nausea And Vomiting        Physical Exam   Constitutional: He is oriented to person, place, and time.   HENT:  Right internal jugular catheter noted Head: Normocephalic.dentition fair. Oral mucosa pink and moist   Eyes:  EOM are normal.   Neck: Normal range of motion. Neck supple. No thyromegaly present.   Cardiovascular: Normal rate. No murmur or gallop. LLE with 1+ edema. Chronic vascular changes.   Pulmonary/Chest: Effort normal and breath sounds normal. No respiratory distress. No wheezes   Abdominal: Soft. Bowel sounds are normal. He exhibits no distension.  Neurological: He is alert and oriented to person, place, and time. CN grossly intact. DTR's decreased.  Pt is alert and appropriate. Right amputation site is dressed without odor;  IV access left arm; left SCD in place; mild edema left leg; left foot status post partial amputation with bandage in place   GLUCOSE, CAPILLARY Status: None      Collection Time      07/23/13 12:04 PM    Result   Value   Range      Glucose-Capillary   99   70 - 99 mg/dL    GLUCOSE, CAPILLARY Status: Abnormal      Collection Time      07/23/13 4:27 PM    Result   Value   Range      Glucose-Capillary   138 (*)   70 - 99 mg/dL    GLUCOSE, CAPILLARY Status: Abnormal      Collection Time      07/23/13 9:42 PM    Result   Value   Range      Glucose-Capillary   138 (*)   70 - 99 mg/dL    BASIC METABOLIC PANEL Status: Abnormal      Collection Time      07/24/13 5:30 AM    Result  Value   Range      Sodium   143   135 - 145 mEq/L      Potassium   3.7   3.5 - 5.1 mEq/L      Chloride   102   96 - 112 mEq/L      CO2   33 (*)   19 - 32 mEq/L      Glucose, Bld   84   70 - 99 mg/dL      BUN   54 (*)   6 - 23 mg/dL      Creatinine, Ser   1.54 (*)   0.50 - 1.35 mg/dL      Calcium   8.0 (*)   8.4 - 10.5 mg/dL      GFR calc non Af Amer   51 (*)   >90 mL/min      GFR calc Af Amer   59 (*)   >90 mL/min      Comment:   (NOTE)        The eGFR has been calculated using the CKD EPI equation.        This calculation has not been validated in all clinical situations.        eGFR's persistently <90 mL/min signify possible Chronic Kidney        Disease.    GLUCOSE, CAPILLARY  Status: None      Collection Time      07/24/13 8:01 AM    Result   Value   Range      Glucose-Capillary   71   70 - 99 mg/dL    GLUCOSE, CAPILLARY Status: Abnormal      Collection Time      07/24/13 11:53 AM    Result   Value   Range      Glucose-Capillary   150 (*)   70 - 99 mg/dL    GLUCOSE, CAPILLARY Status: Abnormal      Collection Time      07/24/13 4:49 PM    Result   Value   Range      Glucose-Capillary   179 (*)   70 - 99 mg/dL      Comment 1   Notify RN      GLUCOSE, CAPILLARY Status: Abnormal      Collection Time      07/24/13 9:03 PM    Result   Value   Range      Glucose-Capillary   150 (*)   70 - 99 mg/dL      Comment 1   Notify RN      CBC Status: Abnormal      Collection Time      07/25/13 5:30 AM    Result   Value   Range      WBC   5.8   4.0 - 10.5 K/uL      RBC   2.93 (*)   4.22 - 5.81 MIL/uL      Hemoglobin   7.7 (*)   13.0 - 17.0 g/dL      HCT   21.3 (*)   08.6 - 52.0 %      MCV   85.3   78.0 - 100.0 fL      MCH   26.3   26.0 - 34.0 pg      MCHC   30.8   30.0 - 36.0 g/dL      RDW   57.8 (*)  11.5 - 15.5 %      Platelets   183   150 - 400 K/uL    GLUCOSE, CAPILLARY Status: Abnormal      Collection Time      07/25/13 7:58 AM    Result   Value   Range      Glucose-Capillary   146 (*)   70 - 99 mg/dL      Comment 1   Documented in Chart        Comment 2   Notify RN      Patient Vitals for the past 24 hrs:  BP Temp Temp src Pulse Resp SpO2  07/28/13 0644 121/83 mmHg 97.6 F (36.4 C) Oral 84 17 93 %  07/27/13 1432 134/70 mmHg 98.5 F (36.9 C) Oral 72 18 97 %     Intake/Output Summary (Last 24 hours) at 07/28/13 0758 Last data filed at 07/28/13 0644  Gross per 24 hour  Intake      0 ml  Output   1175 ml  Net  -1175 ml   CBG (last 3)   Recent Labs  07/27/13 1617 07/27/13 2102 07/28/13 0716  GLUCAP 133* 152* 111*    CBG (last 3)   Recent Labs  07/27/13 1617 07/27/13 2102 07/28/13 0716  GLUCAP 133* 152* 111*      Medical  Problem List and Plan:   1. right BKA due to her Charcot foot/abscess complicated by wound dehiscence after fall, prior left CVA with residual right-sided weakness    2.  Sepsis/MSSA bacteremia. Ancef completes today- monitor clinically going forward 3.  CAD/NSTEMIx2. Continue aspirin therapy. Followup cardiothoracic surgery to consider CABG in the near future   4.  Diastolic congestive heart failure. Continue intravenous Lasix therapy as directed for now. Cardiology services will continue to follow and adjust Lasix as needed. Check daily weights with I's and O's.   continues to diuresis nicely and  5.  Diabetes mellitus peripheral neuropathy. Hemoglobin A1c 8.2 Levemir 23 units each bedtime. Check blood sugars a.c. and at bedtime   presently nice glycemic control 6.  Hypertension. Hydralazine 25 mg every 6 hours, Coreg 25 mg twice a day. Monitor with increased mobility   7. Acute renal failure. Renal function improved with latest creatinine 1.58. Followup chemistries

## 2013-07-29 ENCOUNTER — Inpatient Hospital Stay (HOSPITAL_COMMUNITY): Payer: 59

## 2013-07-29 ENCOUNTER — Inpatient Hospital Stay (HOSPITAL_COMMUNITY): Payer: 59 | Admitting: *Deleted

## 2013-07-29 ENCOUNTER — Inpatient Hospital Stay (HOSPITAL_COMMUNITY): Payer: 59 | Admitting: Occupational Therapy

## 2013-07-29 LAB — COMPREHENSIVE METABOLIC PANEL
AST: 22 U/L (ref 0–37)
Alkaline Phosphatase: 63 U/L (ref 39–117)
BUN: 52 mg/dL — ABNORMAL HIGH (ref 6–23)
CO2: 33 mEq/L — ABNORMAL HIGH (ref 19–32)
Calcium: 8.4 mg/dL (ref 8.4–10.5)
Chloride: 98 mEq/L (ref 96–112)
Creatinine, Ser: 1.37 mg/dL — ABNORMAL HIGH (ref 0.50–1.35)
GFR calc Af Amer: 68 mL/min — ABNORMAL LOW (ref >90)
GFR calc non Af Amer: 59 mL/min — ABNORMAL LOW (ref >90)
Glucose, Bld: 108 mg/dL — ABNORMAL HIGH (ref 70–99)
Potassium: 3.4 mEq/L — ABNORMAL LOW (ref 3.5–5.1)
Total Bilirubin: 0.4 mg/dL (ref 0.3–1.2)

## 2013-07-29 LAB — CBC WITH DIFFERENTIAL/PLATELET
Basophils Absolute: 0 10*3/uL (ref 0.0–0.1)
Eosinophils Relative: 3 % (ref 0–5)
HCT: 23.6 % — ABNORMAL LOW (ref 39.0–52.0)
Hemoglobin: 7.2 g/dL — ABNORMAL LOW (ref 13.0–17.0)
Lymphs Abs: 1.2 10*3/uL (ref 0.7–4.0)
MCHC: 30.5 g/dL (ref 30.0–36.0)
MCV: 84.3 fL (ref 78.0–100.0)
Monocytes Absolute: 0.4 10*3/uL (ref 0.1–1.0)
Monocytes Relative: 9 % (ref 3–12)
Neutro Abs: 3 10*3/uL (ref 1.7–7.7)
Neutrophils Relative %: 63 % (ref 43–77)
Platelets: 211 10*3/uL (ref 150–400)
RBC: 2.8 MIL/uL — ABNORMAL LOW (ref 4.22–5.81)

## 2013-07-29 LAB — GLUCOSE, CAPILLARY
Glucose-Capillary: 120 mg/dL — ABNORMAL HIGH (ref 70–99)
Glucose-Capillary: 183 mg/dL — ABNORMAL HIGH (ref 70–99)

## 2013-07-29 MED ORDER — SODIUM CHLORIDE 0.9 % IJ SOLN
10.0000 mL | INTRAMUSCULAR | Status: DC | PRN
  Administered 2013-07-29 – 2013-08-05 (×10): 10 mL

## 2013-07-29 MED ORDER — OXYCODONE HCL 5 MG PO TABS
5.0000 mg | ORAL_TABLET | ORAL | Status: DC | PRN
  Administered 2013-07-29 – 2013-08-02 (×11): 5 mg via ORAL
  Filled 2013-07-29 (×11): qty 1

## 2013-07-29 NOTE — Progress Notes (Signed)
Occupational Therapy Session Note  Patient Details  Name: Albert [name] MRN: [id number] Date of Birth: 03-29-1961  Today's Date: 07/29/2013 Time: 0830-0930 and 1430-1500 Time Calculation (min): 60 min and 30 min   Short Term Goals: Week 1:  OT Short Term Goal 1 (Week 1): Pt. will complete LB dressing with minimal assist.   OT Short Term Goal 2 (Week 1): Pt will complete toilet transfer to drop arm BSC with mod assist  OT Short Term Goal 3 (Week 1): pt. perform lateral leans for toileting with minimal assist OT Short Term Goal 4 (Week 1): Pt. will be minimal assist with toileting OT Short Term Goal 5 (Week 1): Pt. will maintain dynamic sitting balance with minimal assist.     Skilled Therapeutic Interventions/Progress Updates:    Session 1: Therapy session focused lateral leans, sitting balance, functional transfers during ADL retraining. Pt received supine in bed and completed supine>sit with supervision and HOB raised. Completed sponge bath sitting EOB using lateral leans for buttocks hygiene requiring min assist when using RUE secondary to hx of CVA. Pt required frequent rest breaks throughout session secondary to fatigue. Used lateral leans for clothing management around waist requiring assist to manage completely up as he was fatiguing. Required min-supervision to come back to sitting following lateral leans.Therapist did not have reacher present therefore pt unable to thread LLE into pants. At end of session pt completed squat pivot transfer bed>w/c with max assist. Completed oral care from w/c level and pt then handed off to PT.    Session 2: Therapy session focused on pain management, sitting balance, and core strengthening. Pt received supine in bed with c/o of back pain and requesting to complete therapy in room however willing to complete sitting EOB. Educated on pain relief techniques in bed (i.e. Rolling) to relieve pressure. Completed core strengthening activities using 1 lb and 2  lb weight balls while holding bilaterally. Completed doing lateral rotations and overhead reaching. Provided retrograde massage to RUE and educated on keeping RUE propped on pillow to decrease edema. Pt left supine in bed with heating pack on back.  Therapy Documentation Precautions:  Precautions Precautions: Fall Precaution Comments: R BKA Restrictions Weight Bearing Restrictions: Yes RLE Weight Bearing: Non weight bearing Other Position/Activity Restrictions: elevate operated limb, do not prop pillows under knee; try to position on LEFT side so that RIGHT hip can be in neutral/extension General:   Vital Signs:   Pain: Session 1: Pt with c/o pain in back from "lying in bed" however did not rate it. Pt had been provided tylenol prior to OT session and had decreased pain by end of session.  Session 2: Pt with increase in back pain. Heat pack provided at end of session.   See FIM for current functional status  Therapy/Group: Individual Therapy  Daneil Dan 07/29/2013, 12:13 PM

## 2013-07-29 NOTE — Progress Notes (Signed)
Occupational Therapy Session Note  Patient Details  Name: Albert [name] MRN: [id number] Date of Birth: 1961/03/03  Today's Date: 07/29/2013 Time: 4098-1191 Time Calculation (min): 41 min  Skilled Therapeutic Interventions/Progress Updates:    Pt transferred from mat to wheelchair to the right side, using the sliding board with mod assist.  Propelled the wheelchair from one part of the gym down to the UE ergonometer (approximately 25 ft) with increased time and 1 rest break.  Pt performed UE strengthening using the ergonometer from the wheelchair.  Therapist used ace wrap to help secure the right hand onto the handle as pt has history of L CVA with R residual weakness.  Pt performed 1 interval of 3 mins with both UEs and resistance set at level 7.  He completed 3 more intervals of 2 minutes each with just use of the RUE.  One of the intervals pt attempted to peddle in reverse but needed min assist to complete each revolution.  When peddling forward pt was able to complete with only supervision.  RPMs only at 5-6 with the RUE only.  Able to maintain 20-25 when using both UEs.      Therapy Documentation Precautions:  Precautions Precautions: Fall Precaution Comments: R BKA, history of right hemiparesis secondary to old CVA Restrictions Weight Bearing Restrictions: No RLE Weight Bearing: Non weight bearing Other Position/Activity Restrictions: elevate operated limb, do not prop pillows under knee; try to position on LEFT side so that RIGHT hip can be in neutral/extension  Pain: Pain Assessment Pain Assessment: 0-10 Pain Score: 6  Pain Type: Acute pain Pain Location: Back Pain Orientation: Lower Pain Intervention(s): Repositioned;RN made aware Multiple Pain Sites: No ADL: See FIM for current functional status  Therapy/Group: Individual Therapy  Soni Kegel OTR/L 07/29/2013, 12:38 PM

## 2013-07-29 NOTE — Progress Notes (Signed)
Social Work Assessment and Plan Social Work Assessment and Plan  Patient Details  Name: Albert [name] MRN: [id number] Date of Birth: Nov 18, 1960  Today's Date: 07/29/2013  Problem List:  Patient Active Problem List   Diagnosis Date Noted  . Hx of right BKA 07/26/2013  . Anemia 07/15/2013  . Hyperkalemia 07/15/2013  . Hyposmolality and/or hyponatremia 07/15/2013  . Acute respiratory failure 07/15/2013  . Mitral regurgitation 07/15/2013  . Oliguria 07/15/2013  . Bacteremia due to Staphylococcus aureus 07/15/2013  . Hyperchloremia 07/15/2013  . Acute kidney failure 07/14/2013  . Hypotension, unspecified 07/14/2013  . Acute systolic heart failure 07/12/2013  . Acute pulmonary edema 07/11/2013  . Pneumonia, organism unspecified 07/11/2013  . Hypoxemia 07/11/2013  . Hx of BKA 07/04/2013  . Cardiomyopathy, ischemic 07/03/2013  . Coronary atherosclerosis of native coronary artery 07/01/2013  . NSTEMI (non-ST elevated myocardial infarction) 06/26/2013  . AKI (acute kidney injury) 06/25/2013  . Foot abscess, right 06/25/2013  . Shock circulatory 06/25/2013  . Dehydration with hyponatremia 06/25/2013  . Leukocytosis, unspecified 06/25/2013  . Sepsis(995.91) 06/25/2013  . Aftercare following surgery of the circulatory system, NEC 02/11/2013  . Peripheral vascular disease, unspecified 12/31/2012  . Diabetes mellitus out of control 12/17/2012  . Essential hypertension, benign 12/17/2012  . Atherosclerotic peripheral vascular disease 12/17/2012  . Gangrene 12/08/2012  . Hypercholesteremia    Past Medical History:  Past Medical History  Diagnosis Date  . Hypertension   . Diabetes mellitus without complication ~2000    last HbA1c ~9  . Hypercholesteremia   . CVA (cerebral infarction) 2011    R hand deficit  . Chronic sinusitis   . Allergic rhinitis   . Chronic cough   . Complication of anesthesia     couldn't swallow and talk  . Stroke    Past Surgical History:  Past  Surgical History  Procedure Laterality Date  . 4th toe amputation Left Jan. 2014    4th toe in [place]  . Tonsillectomy and adenoidectomy    . Penile prosthesis implant    . I&d extremity Left 12/08/2012    Procedure: IRRIGATION AND DEBRIDEMENT EXTREMITY;  Surgeon: Kathryne Hitch, MD;  Location: [facility] OR;  Service: Orthopedics;  Laterality: Left;  . Amputation Left 12/08/2012    Procedure: AMPUTATION DIGIT;  Surgeon: Kathryne Hitch, MD;  Location: [facility] OR;  Service: Orthopedics;  Laterality: Left;  3rd toe amputation possible 5th toe amputation  . I&d extremity Left 12/11/2012    Procedure: REPEAT I&D LEFT FOOT;  Surgeon: Kathryne Hitch, MD;  Location: [facility] OR;  Service: Orthopedics;  Laterality: Left;  . Amputation Left 12/11/2012    Procedure: Amputation of 2nd Toe;  Surgeon: Kathryne Hitch, MD;  Location: [facility] OR;  Service: Orthopedics;  Laterality: Left;  . Bypass graft popliteal to tibial Left 12/13/2012    Procedure: BYPASS GRAFT POPLITEAL TO TIBIAL;  Surgeon: Nada Libman, MD;  Location: [facility] OR;  Service: Vascular;  Laterality: Left;  Left Popliteal to Posterior Tibial Bypass Graft with reversed saphenous vein graft  . Incision and drainage Right 06/25/2013    Procedure: INCISION AND DRAINAGE RIGHT FOOT;  Surgeon: Kathryne Hitch, MD;  Location: [facility] ORS;  Service: Orthopedics;  Laterality: Right;  . Amputation Right 06/29/2013    Procedure: AMPUTATION BELOW KNEE;  Surgeon: Nadara Mustard, MD;  Location: [facility] OR;  Service: Orthopedics;  Laterality: Right;  . Tee without cardioversion [name] 07/03/2013    Procedure: TRANSESOPHAGEAL ECHOCARDIOGRAM (TEE);  Surgeon: Eliot Ford  Shirlee Latch, MD;  Location: [facility] ENDOSCOPY;  Service: Cardiovascular;  Laterality: [name];   Social History:  reports that he has never smoked. He has never used smokeless tobacco. He reports that he does not drink alcohol or use illicit drugs.  Family / Support Systems Marital Status: Married Patient Roles:  Spouse;Parent;Other (Comment) (Employee) Spouse/Significant Other: [name]  [phone]-cell Children: 49 yo and 57 yo son Other Supports: Friends Anticipated Caregiver: Wife and 60 yo son Ability/Limitations of Caregiver: Wife works 7p-7a three days a week at [employer] Caregiver Availability: Other (Comment) (Coming up with a plan-awaiting progress) Family Dynamics: Close knit family who are there for one another.  Pt does not want to need care at discharge, so will try his best while here.  Son is willing to help while wife works.  Social History Preferred language: English Religion: Christian Cultural Background: No issues Education: High School Read: Yes Write: Yes Employment Status: Employed Name of Employer: [employer] stopped working in Jan 2014 Return to Work Plans: Unsure at this time since more surgery needed Fish farm manager Issues: No issues Guardian/Conservator: None-according to MD pt is capable of making his own decisions.     Abuse/Neglect Physical Abuse: Denies Verbal Abuse: Denies Sexual Abuse: Denies Exploitation of patient/patient's resources: Denies Self-Neglect: Denies  Emotional Status Pt's affect, behavior adn adjustment status: Pt feels he is weaker than he was when he was here before.  He wants to be as independent as he can be, he doesn't want to ask his family for assistance.  He feels he could manage before and he will again with hard work.  He takes each day at a time to get through all of this. Recent Psychosocial Issues: Other medical issues-kidney issues and cardiac issues. Pyschiatric History: No history deferred depression screen due to pt tired from therapies.  He would benefit from Neuro-psych due to long hospitalization and multiple medical issues.   Substance Abuse History: No issues  Patient / Family Perceptions, Expectations & Goals Pt/Family understanding of illness & functional limitations: Pt is able to expalin his medical  issues and talks with MD each morning while they round.  His wife is also awar eof medical issues and keps updated.  He feels better preapred this time on rheba he felt very sick last time.  He is hopeful he will do well here. Premorbid pt/family roles/activities: Husband, father, Employee, Home owner,. etc Anticipated changes in roles/activities/participation: resume Pt/family expectations/goals: Pt states: " I want to be as independent as I can be beofre going home."  He does not want to burden his family.  Wife states; " I am hopeful he will do much for himself."  [employer]: Other (Comment) ([facility]) Premorbid Home Care/DME Agencies: None Transportation available at discharge: Family Resource referrals recommended: Support group (specify) (Amputee Support group)  Discharge Planning Living Arrangements: Spouse/significant other;Children Support Systems: Spouse/significant other;Children;Other relatives;Friends/neighbors Type of Residence: Private residence Insurance Resources: Media planner (specify) Education officer, museum) Financial Resources: Family Support;Other (Comment) (Pending SSD) Financial Screen Referred: No Living Expenses: Lives with family Money Management: Spouse;Patient Does the patient have any problems obtaining your medications?: No Home Management: Wife Patient/Family Preliminary Plans: Return home with family assisting if necessary.  Wife works three days a week at night and tow son's will be there with him.  WIll need to to train them prior to discharge and pt needs a ramp into his hoem by discharge. Social Work Anticipated Follow Up Needs: HH/OP;Support Group  Clinical Impression Pleasant gentleman  who is known to this worker from last admission.  Feels much better than before and feels more optimistic this time.  Aware may need a ramp at home at discharge. Pt really wants to be as independent as possible before going home.  See if completed  SSD application and assist with this.  Include family in education before discharge.   Lucy Chris 07/29/2013, 2:26 PM

## 2013-07-29 NOTE — Plan of Care (Signed)
Problem: RH BOWEL ELIMINATION Goal: RH STG MANAGE BOWEL WITH ASSISTANCE STG Manage Bowel with Assistance.  Foley in place  Problem: RH BLADDER ELIMINATION Goal: RH STG MANAGE BLADDER WITH EQUIPMENT WITH ASSISTANCE STG Manage Bladder With Equipment With Assistance  Foley

## 2013-07-29 NOTE — Progress Notes (Signed)
Physical Therapy Session Note  Patient Details  Name: Albert [name] MRN: [id number] Date of Birth: 1961-07-22  Today's Date: 07/29/2013 Time: 0930-1030 Time Calculation (min): 60 min  Short Term Goals: Week 1:  PT Short Term Goal 1 (Week 1): Pt will roll L with side rail, head of bed flat and S.  PT Short Term Goal 2 (Week 1): Pt will roll R with side rail, head of bed flat and min A.  PT Short Term Goal 3 (Week 1): Pt will transfer supine to edge of bed, edge of bed to supine with min A.  PT Short Term Goal 4 (Week 1): Pt will transfer bed to chair, chair to bed with or without sliding board and min A.  PT Short Term Goal 5 (Week 1): Pt will propel w/c about 50 feet with L UE and LE and S.   Skilled Therapeutic Interventions/Progress Updates:    Patient received seated in wheelchair, Darco shoe donned L LE. Session focused on wheelchair mobility, functional transfers, and LE strengthening. Discussion with patient regarding width of home doorways and ramp installation for entry to home, patient reported that his doorways are 36" and will fit a wheelchair, he is putting a call into a local church for information on installation of a ramp for one step to enter home. Patient squat pivot transfer wheelchafir>level mat table +2total A, verbal cueing for hand placement and increased weight bearing through L LE to assist transfer. Patient instructed in LE strengthening exercises all 2x15 B LE,  seated LAQs, supine SLR, supine hip ABD, seated hip flexion, and seated tricep dips with platform handles. Ended session patient seated on mat table until OT session.   Therapy Documentation Precautions:  Precautions Precautions: Fall Precaution Comments: R BKA Restrictions Weight Bearing Restrictions: Yes RLE Weight Bearing: Non weight bearing Other Position/Activity Restrictions: elevate operated limb, do not prop pillows under knee; try to position on LEFT side so that RIGHT hip can be in  neutral/extension   Pain: Pain Assessment Pain Assessment: 0-10 Pain Score: 6  Pain Location: Back Pain Orientation: Lower   Locomotion : Wheelchair Mobility Distance: 100'   See FIM for current functional status  Therapy/Group: Individual Therapy  Doralee Albino 07/29/2013, 12:23 PM

## 2013-07-29 NOTE — Care Management Note (Signed)
[facility] Individual Statement of Services  Patient Name:  Albert [name]  Date:  07/29/2013  Welcome to the [facility].  Our goal is to provide you with an individualized program based on your diagnosis and situation, designed to meet your specific needs.  With this comprehensive rehabilitation program, you will be expected to participate in at least 3 hours of rehabilitation therapies Monday-Friday, with modified therapy programming on the weekends.  Your rehabilitation program will include the following services:  Physical Therapy (PT), Occupational Therapy (OT), 24 hour per day rehabilitation nursing, Neuropsychology, Case Management (Social Worker), Rehabilitation Medicine, Nutrition Services and Pharmacy Services  Weekly team conferences will be held on Wednesday to discuss your progress.  Your Social Worker will talk with you frequently to get your input and to update you on team discussions.  Team conferences with you and your family in attendance may also be held.  Expected length of stay: 12-14 days Overall anticipated outcome: mod/i transfers w/c level  Depending on your progress and recovery, your program may change. Your Social Worker will coordinate services and will keep you informed of any changes. Your Social Worker's name and contact numbers are listed  below.  The following services may also be recommended but are not provided by the [facility]:   Driving Evaluations  Home Health Rehabiltiation Services  Outpatient Rehabilitation Services  Vocational Rehabilitation   Arrangements will be made to provide these services after discharge if needed.  Arrangements include referral to agencies that provide these services.  Your insurance has been verified to be:  [facility] Your primary doctor is:  Dr Elita Boone  Pertinent information will be shared with your doctor and your insurance company.  Social Worker:   Dossie Der, [phone] or [phone]  Information discussed with and copy given to patient by: Lucy Chris, 07/29/2013, 1:52 PM

## 2013-07-29 NOTE — Progress Notes (Signed)
I have reviewed and agree with the treatment as reflected in this note. Wray Goehring, PT DPT  

## 2013-07-29 NOTE — Progress Notes (Signed)
Patient information reviewed and entered into eRehab system by Yehia Mcbain, RN, CRRN, PPS Coordinator.  Information including medical coding and functional independence measure will be reviewed and updated through discharge.    

## 2013-07-29 NOTE — IPOC Note (Signed)
Overall Plan of Care [facility]) Patient Details Name: Albert [name] MRN: [id number] DOB: 09/12/1961  Admitting Diagnosis: R BKA  Hospital Problems: Principal Problem:   Hx of right BKA CAD AKI    Functional Problem List: Nursing Bladder;Edema;Endurance;Medication Management;Pain;Safety;Skin Integrity  PT Balance;Endurance;Motor;Pain;Safety  OT Balance;Edema;Endurance;Motor;Pain;Safety  SLP    TR         Basic ADL's: OT Grooming;Bathing;Dressing;Toileting     Advanced  ADL's: OT       Transfers: PT Bed Mobility;Bed to Chair;Car  OT Toilet;Tub/Shower     Locomotion: Education officer, community;Ambulation;Stairs     Additional Impairments: OT Fuctional Use of Upper Extremity  SLP        TR      Anticipated Outcomes Item Anticipated Outcome  Self Feeding independent  Swallowing      Basic self-care  set up  Toileting  supervision   Bathroom Transfers supervision   Bowel/Bladder  continent of bowel with mod i assist; continent of bladder with equipment and mod I assist  Transfers  mod I transfers  Locomotion  S wheelchair propulsion, no stair goal, no gait goal  Communication     Cognition     Pain  n/a; reports not an issue  Safety/Judgment  maintain safety with min assist   Therapy Plan: PT Intensity: Minimum of 1-2 x/day ,45 to 90 minutes PT Frequency: 5 out of 7 days PT Duration Estimated Length of Stay: 12-14 days OT Intensity: Minimum of 1-2 x/day, 45 to 90 minutes OT Frequency: 5 out of 7 days OT Duration/Estimated Length of Stay: 12-14 days         Team Interventions: Nursing Interventions Patient/Family Education;Bladder Management;Bowel Management;Disease Management/Prevention;Medication Management;Skin Care/Wound Management;Discharge Planning;Psychosocial Support  PT interventions Ambulation/gait training;Balance/vestibular training;Community reintegration;Discharge planning;DME/adaptive equipment instruction;Functional electrical  stimulation;Functional mobility training;Pain management;Neuromuscular re-education;Patient/family education;Therapeutic Exercise;UE/LE Coordination activities;Therapeutic Activities;Stair training;UE/LE Strength taining/ROM;Wheelchair propulsion/positioning;Splinting/orthotics  OT Interventions Balance/vestibular training;Cognitive remediation/compensation;Community reintegration;Discharge planning;DME/adaptive equipment instruction;Functional mobility training;Patient/family education;Psychosocial support;Self Care/advanced ADL retraining;Therapeutic Exercise;Therapeutic Activities;UE/LE Strength taining/ROM;UE/LE Coordination activities;Wheelchair propulsion/positioning;Neuromuscular re-education  SLP Interventions    TR Interventions    SW/CM Interventions Discharge Planning;Psychosocial Support;Patient/Family Education    Team Discharge Planning: Destination: PT-Home ,OT- Home , SLP-  Projected Follow-up: PT-Home health PT, OT-  Home health OT;Outpatient OT, SLP-  Projected Equipment Needs: PT-Rolling walker with 5" wheels;Wheelchair (measurements);Sliding board, OT- Tub/shower bench;3 in 1 bedside comode (dropped arm commode chair if he does not have), SLP-  Patient/family involved in discharge planning: PT- Patient,  OT-Patient, SLP-   MD ELOS: 3wks Medical Rehab Prognosis:  Fair Assessment: 52 year old patient admitted to rehabilitation following a right BKA. Medical problems include hypertension and diabetes as well his peripheral vascular disease. Hospital course complicated by acute kidney injury and NSTEMI. He has a history of coronary artery disease and CABG is being considered post recovery for his triple-vessel disease  Now requiring 24/7 Rehab RN,MD, as well as CIR level PT, OT .  Treatment team will focus on ADLs and mobility with goals set at Supervision level   See Team Conference Notes for weekly updates to the plan of care

## 2013-07-29 NOTE — Progress Notes (Signed)
52 year old patient admitted to rehabilitation following a right BKA. Medical problems include hypertension and diabetes as well his peripheral vascular disease. Hospital course complicated by acute kidney injury and NSTEMI. He has a history of coronary artery disease and CABG is being considered post recovery for his triple-vessel disease.  Subjective/Complaints:   Objective: Vital Signs: Blood pressure 120/84, pulse 76, temperature 97.6 F (36.4 C), temperature source Oral, resp. rate 19, weight 115.395 kg (254 lb 6.4 oz), SpO2 95.00%. No results found. Results for orders placed during the hospital encounter of 07/26/13 (from the past 72 hour(s))  GLUCOSE, CAPILLARY     Status: Abnormal   Collection Time    07/26/13  4:10 PM      Result Value Range   Glucose-Capillary 131 (*) 70 - 99 mg/dL  GLUCOSE, CAPILLARY     Status: Abnormal   Collection Time    07/26/13  5:17 PM      Result Value Range   Glucose-Capillary 135 (*) 70 - 99 mg/dL  GLUCOSE, CAPILLARY     Status: Abnormal   Collection Time    07/26/13  9:44 PM      Result Value Range   Glucose-Capillary 130 (*) 70 - 99 mg/dL  GLUCOSE, CAPILLARY     Status: Abnormal   Collection Time    07/27/13  7:18 AM      Result Value Range   Glucose-Capillary 148 (*) 70 - 99 mg/dL   Comment 1 Notify RN     Comment 2 Documented in Chart    GLUCOSE, CAPILLARY     Status: Abnormal   Collection Time    07/27/13 11:25 AM      Result Value Range   Glucose-Capillary 158 (*) 70 - 99 mg/dL   Comment 1 Notify RN     Comment 2 Documented in Chart    GLUCOSE, CAPILLARY     Status: Abnormal   Collection Time    07/27/13  4:17 PM      Result Value Range   Glucose-Capillary 133 (*) 70 - 99 mg/dL   Comment 1 Notify RN     Comment 2 Documented in Chart    GLUCOSE, CAPILLARY     Status: Abnormal   Collection Time    07/27/13  9:02 PM      Result Value Range   Glucose-Capillary 152 (*) 70 - 99 mg/dL   Comment 1 Notify RN    GLUCOSE, CAPILLARY      Status: Abnormal   Collection Time    07/28/13  7:16 AM      Result Value Range   Glucose-Capillary 111 (*) 70 - 99 mg/dL   Comment 1 Notify RN    GLUCOSE, CAPILLARY     Status: Abnormal   Collection Time    07/28/13 11:49 AM      Result Value Range   Glucose-Capillary 125 (*) 70 - 99 mg/dL   Comment 1 Notify RN    GLUCOSE, CAPILLARY     Status: Abnormal   Collection Time    07/28/13  4:20 PM      Result Value Range   Glucose-Capillary 125 (*) 70 - 99 mg/dL   Comment 1 Notify RN    GLUCOSE, CAPILLARY     Status: Abnormal   Collection Time    07/28/13  8:37 PM      Result Value Range   Glucose-Capillary 146 (*) 70 - 99 mg/dL   Comment 1 Notify RN    CBC WITH DIFFERENTIAL  Status: Abnormal   Collection Time    07/29/13  5:00 AM      Result Value Range   WBC 4.7  4.0 - 10.5 K/uL   RBC 2.80 (*) 4.22 - 5.81 MIL/uL   Hemoglobin 7.2 (*) 13.0 - 17.0 g/dL   HCT 32.4 (*) 40.1 - 02.7 %   MCV 84.3  78.0 - 100.0 fL   MCH 25.7 (*) 26.0 - 34.0 pg   MCHC 30.5  30.0 - 36.0 g/dL   RDW 25.3 (*) 66.4 - 40.3 %   Platelets 211  150 - 400 K/uL   Neutrophils Relative % 63  43 - 77 %   Neutro Abs 3.0  1.7 - 7.7 K/uL   Lymphocytes Relative 25  12 - 46 %   Lymphs Abs 1.2  0.7 - 4.0 K/uL   Monocytes Relative 9  3 - 12 %   Monocytes Absolute 0.4  0.1 - 1.0 K/uL   Eosinophils Relative 3  0 - 5 %   Eosinophils Absolute 0.1  0.0 - 0.7 K/uL   Basophils Relative 0  0 - 1 %   Basophils Absolute 0.0  0.0 - 0.1 K/uL  COMPREHENSIVE METABOLIC PANEL     Status: Abnormal   Collection Time    07/29/13  5:00 AM      Result Value Range   Sodium 138  135 - 145 mEq/L   Potassium 3.4 (*) 3.5 - 5.1 mEq/L   Chloride 98  96 - 112 mEq/L   CO2 33 (*) 19 - 32 mEq/L   Glucose, Bld 108 (*) 70 - 99 mg/dL   BUN 52 (*) 6 - 23 mg/dL   Creatinine, Ser 4.74 (*) 0.50 - 1.35 mg/dL   Calcium 8.4  8.4 - 25.9 mg/dL   Total Protein 5.7 (*) 6.0 - 8.3 g/dL   Albumin 2.5 (*) 3.5 - 5.2 g/dL   AST 22  0 - 37 U/L   ALT  12  0 - 53 U/L   Alkaline Phosphatase 63  39 - 117 U/L   Total Bilirubin 0.4  0.3 - 1.2 mg/dL   GFR calc non Af Amer 59 (*) >90 mL/min   GFR calc Af Amer 68 (*) >90 mL/min   Comment: (NOTE)     The eGFR has been calculated using the CKD EPI equation.     This calculation has not been validated in all clinical situations.     eGFR's persistently <90 mL/min signify possible Chronic Kidney     Disease.  GLUCOSE, CAPILLARY     Status: Abnormal   Collection Time    07/29/13  7:13 AM      Result Value Range   Glucose-Capillary 102 (*) 70 - 99 mg/dL   Comment 1 Notify RN       HEENT: normal Cardio: RRR and no murmur Resp: CTA B/L and unlabored GI: BS positive and non distended Extremity:  No Edema Skin:   Wound R BK stump medial eschar loosening, no erythema or drainage Neuro: Alert/Oriented, Anxious, Abnormal Motor 3-/5 R delt , bi, tri, grip, 4/5 R HF, 5/5 in L delt bi tri grip,4/5 L HF knee ext and 3- ankle DF and Tone:  Within Normal Limits Musc/Skel:  Extremity tender R stump mild tenderness, Left distal foot Gen NAD   Assessment/Plan: 1. Functional deficits secondary to R BKA and deconditioning which require 3+ hours per day of interdisciplinary therapy in a comprehensive inpatient rehab setting. Physiatrist is providing close team  supervision and 24 hour management of active medical problems listed below. Physiatrist and rehab team continue to assess barriers to discharge/monitor patient progress toward functional and medical goals. FIM: FIM - Bathing Bathing Steps Patient Completed: Chest;Right Arm;Left Arm;Abdomen;Left upper leg;Front perineal area;Right upper leg Bathing: 3: Mod-Patient completes 5-7 45f 10 parts or 50-74%  FIM - Upper Body Dressing/Undressing Upper body dressing/undressing steps patient completed: Thread/unthread right sleeve of pullover shirt/dresss;Thread/unthread left sleeve of pullover shirt/dress;Put head through opening of pull over shirt/dress;Pull  shirt over trunk Upper body dressing/undressing: 5: Set-up assist to: Obtain clothing/put away FIM - Lower Body Dressing/Undressing Lower body dressing/undressing steps patient completed: Thread/unthread left pants leg;Thread/unthread right pants leg Lower body dressing/undressing: 3: Mod-Patient completed 50-74% of tasks  FIM - Toileting Toileting: 1: Total-Patient completed zero steps, helper did all 3  FIM - Diplomatic Services operational officer Devices: Grab bars Toilet Transfers: 1-Two helpers  FIM - Banker Devices: Bed rails;Arm rests Bed/Chair Transfer: 1: Two helpers  FIM - Locomotion: Wheelchair Distance: 50 feet Locomotion: Wheelchair: 1: Travels less than 50 ft with supervision, cueing or coaxing FIM - Locomotion: Ambulation Ambulation/Gait Assistance: Not tested (comment) Locomotion: Ambulation: 0: Activity did not occur  Comprehension Comprehension Mode: Auditory Comprehension: 6-Follows complex conversation/direction: With extra time/assistive device  Expression Expression Mode: Verbal Expression: 6-Expresses complex ideas: With extra time/assistive device  Social Interaction Social Interaction: 6-Interacts appropriately with others with medication or extra time (anti-anxiety, antidepressant).  Problem Solving Problem Solving: 5-Solves complex 90% of the time/cues < 10% of the time  Memory Memory: 6-Assistive device: No helper  Medical Problem List and Plan:  1. right BKA due to her Charcot foot/abscess complicated by wound dehiscence after fall, prior left CVA with residual right-sided weakness  2. DVT Prophylaxis/Anticoagulation: SCDs left lower extremity  3. Pain Management: Presently Tylenol only. Will monitor with increased therapy  4. Mood: Xanax 0.25 mg twice a day as needed. Provide emotional support  5. Neuropsych: This patient is capable of making decisions on his own behalf.  6. Recent multi-toe  amputation left foot February 2014. dressing changes followed by wound care nurse. Increase frequency as needed to keep wounds clean and dry.  7. ID. Sepsis/MSSA bacteremia. Ancef completes today- monitor clinically going forward  8. CAD/NSTEMIx2. Continue aspirin therapy. Followup cardiothoracic surgery to consider CABG in the near future  9. Diastolic congestive heart failure. Continue intravenous Lasix therapy as directed for now. Cardiology services will continue to follow and adjust Lasix as needed. Check daily weights with I's and O's.  10. Diabetes mellitus peripheral neuropathy. Hemoglobin A1c 8.2 Levemir 23 units each bedtime. Check blood sugars a.c. and at bedtime  11. Hypertension. Hydralazine 25 mg every 6 hours, Coreg 25 mg twice a day. Monitor with increased mobility  12. Acute renal failure. Renal function improved with latest creatinine 1.54. Followup chemistries  13. Acute blood loss anemia. Latest hemoglobin 7.7. Transfuse if symptomatic    LOS (Days) 3 A FACE TO FACE EVALUATION WAS PERFORMED  Regginald Pask E 07/29/2013, 8:17 AM

## 2013-07-30 ENCOUNTER — Inpatient Hospital Stay (HOSPITAL_COMMUNITY): Payer: 59 | Admitting: *Deleted

## 2013-07-30 ENCOUNTER — Inpatient Hospital Stay (HOSPITAL_COMMUNITY): Payer: 59

## 2013-07-30 DIAGNOSIS — S88119A Complete traumatic amputation at level between knee and ankle, unspecified lower leg, initial encounter: Secondary | ICD-10-CM

## 2013-07-30 DIAGNOSIS — I5021 Acute systolic (congestive) heart failure: Secondary | ICD-10-CM

## 2013-07-30 DIAGNOSIS — I509 Heart failure, unspecified: Secondary | ICD-10-CM

## 2013-07-30 DIAGNOSIS — L98499 Non-pressure chronic ulcer of skin of other sites with unspecified severity: Secondary | ICD-10-CM

## 2013-07-30 DIAGNOSIS — I739 Peripheral vascular disease, unspecified: Secondary | ICD-10-CM

## 2013-07-30 LAB — GLUCOSE, CAPILLARY
Glucose-Capillary: 115 mg/dL — ABNORMAL HIGH (ref 70–99)
Glucose-Capillary: 110 mg/dL — ABNORMAL HIGH (ref 70–99)

## 2013-07-30 MED ORDER — HYDRALAZINE HCL 10 MG PO TABS
10.0000 mg | ORAL_TABLET | Freq: Four times a day (QID) | ORAL | Status: DC
  Administered 2013-07-30 – 2013-08-08 (×37): 10 mg via ORAL
  Filled 2013-07-30 (×40): qty 1

## 2013-07-30 MED ORDER — FUROSEMIDE 10 MG/ML IJ SOLN
60.0000 mg | Freq: Two times a day (BID) | INTRAMUSCULAR | Status: DC
  Administered 2013-07-30 – 2013-08-04 (×12): 60 mg via INTRAVENOUS
  Filled 2013-07-30 (×16): qty 6

## 2013-07-30 MED ORDER — POTASSIUM CHLORIDE CRYS ER 10 MEQ PO TBCR
10.0000 meq | EXTENDED_RELEASE_TABLET | Freq: Two times a day (BID) | ORAL | Status: AC
  Administered 2013-07-30 – 2013-07-31 (×4): 10 meq via ORAL
  Filled 2013-07-30 (×4): qty 1

## 2013-07-30 MED ORDER — ATORVASTATIN CALCIUM 40 MG PO TABS
40.0000 mg | ORAL_TABLET | Freq: Every day | ORAL | Status: DC
  Administered 2013-07-30 – 2013-08-07 (×9): 40 mg via ORAL
  Filled 2013-07-30 (×10): qty 1

## 2013-07-30 NOTE — Progress Notes (Signed)
Physical Therapy Session Note  Patient Details  Name: Albert [name] MRN: [id number] Date of Birth: 1961/04/28  Today's Date: 07/30/2013 Time: 0930-1030 and 1300-1330 Time Calculation (min): 60 min and  Short Term Goals: Week 1:  PT Short Term Goal 1 (Week 1): Pt will roll L with side rail, head of bed flat and S.  PT Short Term Goal 2 (Week 1): Pt will roll R with side rail, head of bed flat and min A.  PT Short Term Goal 3 (Week 1): Pt will transfer supine to edge of bed, edge of bed to supine with min A.  PT Short Term Goal 4 (Week 1): Pt will transfer bed to chair, chair to bed with or without sliding board and min A.  PT Short Term Goal 5 (Week 1): Pt will propel w/c about 50 feet with L UE and LE and S.   Skilled Therapeutic Interventions/Progress Updates:    AM session: Patient received seated in wheelchair, Darco shoe donned L LE. Session focused on wheelchair mobility, bed mobility, and squat pivot transfers. See below for details. Patient reported feeling SOB following sit>supine transfer, patient states he feels this way when he lays on his back for extended periods of time, patient instructed in deep breathing techniques. Patients BP taken 119/87 supine and again in sitting BP 114/81. Therapist added additional theraband to R wheel of wheelchair for increased grip for wheelchair propulsion. Patient returned to room, seated in wheelchair all needs within reach.   PM Session: Patient received seated in wheelchair, Darco shoe donned L LE. Session focused on functional transfers and seated mat activity. Discussion with patient regarding ramp and doorways at home, patient reports he is still waiting to hear about the ramp, and that the door into his home is wide enough for a wheelchair, but the bathroom door is not so he will be using a BSC. Patient instructed in 5x1 wheelchair push ups, for UE strengthening and education on importance of pressure relief when seated in wheelchair for  extended periods of time. Patient squat pivot transfer wheelchair>level mat table (to L side) maxA, verbal cues for scooting forward in chair, hand placement, anterior weight shift in opposite direction of hips and placement of L LE. Patient instructed in R and L lateral scooting on mat table x3 each direction. Verbal cuing for hand placement, use of L LE on floor to help push himself up and over along edge of mat for scooting movement. Therapist's foot used to block L LE to prevent Darco shoe from slipping during activity. Patient instructed squat pivot transfer level mat table> wheelchair maxA (to R side), with verbal cues for hand placement, anterior weight shift, and placement of L LE, dysom placed under patient L LE to prevent slipping during transfer. Patient returned to room, seated in wheelchair with all needs within reach.   Therapy Documentation Precautions:  Precautions Precautions: Fall Precaution Comments: R BKA, history of right hemiparesis secondary to old CVA Restrictions Weight Bearing Restrictions: Yes RLE Weight Bearing: Non weight bearing Other Position/Activity Restrictions: elevate operated limb, do not prop pillows under knee; try to position on LEFT side so that RIGHT hip can be in neutral/extension Pain: Pain Assessment Pain Assessment: No/denies pain Pain Score: 0-No pain Mobility: Bed Mobility Bed Mobility: Supine to Sit;Sit to Supine Supine to Sit: 5: Supervision;HOB flat Supine to Sit Details: Verbal cues for technique;Verbal cues for sequencing;Verbal cues for precautions/safety;Visual cues/gestures for sequencing Supine to Sit Details (indicate cue type and reason):  Patient instructed in supine>sit x3 trials on mat table supervision for reinforcement of technique. Patient instructed to roll into L sidelying and use L UE to help push himself up into sitting position on edge of mat table. Visual demonstration and verbal cues for hand placement and sequencing of LEs  during tranfer.  Sit to Supine: 5: Supervision;HOB flat Sit to Supine - Details: Verbal cues for sequencing;Verbal cues for technique;Verbal cues for precautions/safety;Visual cues/gestures for sequencing Sit to Supine - Details (indicate cue type and reason): Patient instructed in sit>supine x3 trials on mat table supervision level for reinforcement of technique, to facilitate increased coordination and strengthening for bed mobility. Patient instructed to tranfer into L sidelying and then continue into supine position. Visual demonstration and verbal cues for hand placement, sequencing of LEs and use of L UE to help control lowering of upper body onto mat.  Transfers Transfers: Yes Squat Pivot Transfers: 2: Max assist;1: +2 Total assist;From elevated surface;With armrests Squat Pivot Transfer Details: Tactile cues for placement;Tactile cues for weight beaing;Verbal cues for sequencing;Verbal cues for technique;Verbal cues for precautions/safety Squat Pivot Transfer Details (indicate cue type and reason): Patient instructed in wheelchair>low mat table squat pivot transfer (to L side)  maxA, verbal cues for hand placement, scooting forward in chair and anterior weight shift. Low mat table>wheelchair +2 totalA, during transfer unable to reach mat completely requiring assistance of second therapist to complete squat pivot transfer to mat, verbal cues for placement of L UE on armrest, lateral scoot on mat for positioning and for increased weight bearing through L LE. Patient instructed in wheelchair<>mat table x 1 maxA, verbal cues for hand placement, increased weight bearing through LLE, and for increased lifting of bottom rather than scooting during transfer.  Locomotion : Ambulation Ambulation: No Stairs / Additional Locomotion Stairs: No Corporate treasurer: Yes Wheelchair Assistance: 5: Financial planner Details: Verbal cues for technique;Verbal cues for  precautions/safety;Verbal cues for safe use of DME/AE Wheelchair Propulsion: Both upper extremities;Left lower extremity Wheelchair Parts Management: Needs assistance Distance: 125'   See FIM for current functional status  Therapy/Group: Individual Therapy  Doralee Albino 07/30/2013, 12:07 PM

## 2013-07-30 NOTE — Progress Notes (Signed)
I have reviewed and agree with the treatment as reflected in this note. Yuna Pizzolato, PT DPT  

## 2013-07-30 NOTE — Progress Notes (Signed)
Social Work Patient ID: Albert [name], male   DOB: 28-Nov-1960, 52 y.o.   MRN: [id number] Requested by PT-Bridget to contact wife to find out measurements of doorways and the need for a ramp at home.  Wife reports they are working on the ramp and Her mother is in a wheelchair and she lives with them, but will measure the doorways for PT.  Dicussed her husband will need 24 hour physical care at discharge. She reports between she and her boys they will be able to provide this level of care.  Will update her tomorrow after team conference.

## 2013-07-30 NOTE — Progress Notes (Signed)
52 year old patient admitted to rehabilitation following a right BKA. Medical problems include hypertension and diabetes as well his peripheral vascular disease. Hospital course complicated by acute kidney injury and NSTEMI. He has a history of coronary artery disease and CABG is being considered post recovery for his triple-vessel disease.  Subjective/Complaints: No pain c/os Slept ok No SOB,not receiving  IV lasix since 10/10, no cardiology f/u thus far.  Objective: Vital Signs: Blood pressure 126/88, pulse 84, temperature 98.1 F (36.7 C), temperature source Oral, resp. rate 17, weight 115.395 kg (254 lb 6.4 oz), SpO2 96.00%. No results found. Results for orders placed during the hospital encounter of 07/26/13 (from the past 72 hour(s))  GLUCOSE, CAPILLARY     Status: Abnormal   Collection Time    07/27/13 11:25 AM      Result Value Range   Glucose-Capillary 158 (*) 70 - 99 mg/dL   Comment 1 Notify RN     Comment 2 Documented in Chart    GLUCOSE, CAPILLARY     Status: Abnormal   Collection Time    07/27/13  4:17 PM      Result Value Range   Glucose-Capillary 133 (*) 70 - 99 mg/dL   Comment 1 Notify RN     Comment 2 Documented in Chart    GLUCOSE, CAPILLARY     Status: Abnormal   Collection Time    07/27/13  9:02 PM      Result Value Range   Glucose-Capillary 152 (*) 70 - 99 mg/dL   Comment 1 Notify RN    GLUCOSE, CAPILLARY     Status: Abnormal   Collection Time    07/28/13  7:16 AM      Result Value Range   Glucose-Capillary 111 (*) 70 - 99 mg/dL   Comment 1 Notify RN    GLUCOSE, CAPILLARY     Status: Abnormal   Collection Time    07/28/13 11:49 AM      Result Value Range   Glucose-Capillary 125 (*) 70 - 99 mg/dL   Comment 1 Notify RN    GLUCOSE, CAPILLARY     Status: Abnormal   Collection Time    07/28/13  4:20 PM      Result Value Range   Glucose-Capillary 125 (*) 70 - 99 mg/dL   Comment 1 Notify RN    GLUCOSE, CAPILLARY     Status: Abnormal   Collection Time     07/28/13  8:37 PM      Result Value Range   Glucose-Capillary 146 (*) 70 - 99 mg/dL   Comment 1 Notify RN    CBC WITH DIFFERENTIAL     Status: Abnormal   Collection Time    07/29/13  5:00 AM      Result Value Range   WBC 4.7  4.0 - 10.5 K/uL   RBC 2.80 (*) 4.22 - 5.81 MIL/uL   Hemoglobin 7.2 (*) 13.0 - 17.0 g/dL   HCT 21.3 (*) 08.6 - 57.8 %   MCV 84.3  78.0 - 100.0 fL   MCH 25.7 (*) 26.0 - 34.0 pg   MCHC 30.5  30.0 - 36.0 g/dL   RDW 46.9 (*) 62.9 - 52.8 %   Platelets 211  150 - 400 K/uL   Neutrophils Relative % 63  43 - 77 %   Neutro Abs 3.0  1.7 - 7.7 K/uL   Lymphocytes Relative 25  12 - 46 %   Lymphs Abs 1.2  0.7 - 4.0 K/uL  Monocytes Relative 9  3 - 12 %   Monocytes Absolute 0.4  0.1 - 1.0 K/uL   Eosinophils Relative 3  0 - 5 %   Eosinophils Absolute 0.1  0.0 - 0.7 K/uL   Basophils Relative 0  0 - 1 %   Basophils Absolute 0.0  0.0 - 0.1 K/uL  COMPREHENSIVE METABOLIC PANEL     Status: Abnormal   Collection Time    07/29/13  5:00 AM      Result Value Range   Sodium 138  135 - 145 mEq/L   Potassium 3.4 (*) 3.5 - 5.1 mEq/L   Chloride 98  96 - 112 mEq/L   CO2 33 (*) 19 - 32 mEq/L   Glucose, Bld 108 (*) 70 - 99 mg/dL   BUN 52 (*) 6 - 23 mg/dL   Creatinine, Ser 5.78 (*) 0.50 - 1.35 mg/dL   Calcium 8.4  8.4 - 46.9 mg/dL   Total Protein 5.7 (*) 6.0 - 8.3 g/dL   Albumin 2.5 (*) 3.5 - 5.2 g/dL   AST 22  0 - 37 U/L   ALT 12  0 - 53 U/L   Alkaline Phosphatase 63  39 - 117 U/L   Total Bilirubin 0.4  0.3 - 1.2 mg/dL   GFR calc non Af Amer 59 (*) >90 mL/min   GFR calc Af Amer 68 (*) >90 mL/min   Comment: (NOTE)     The eGFR has been calculated using the CKD EPI equation.     This calculation has not been validated in all clinical situations.     eGFR's persistently <90 mL/min signify possible Chronic Kidney     Disease.  GLUCOSE, CAPILLARY     Status: Abnormal   Collection Time    07/29/13  7:13 AM      Result Value Range   Glucose-Capillary 102 (*) 70 - 99 mg/dL    Comment 1 Notify RN    GLUCOSE, CAPILLARY     Status: Abnormal   Collection Time    07/29/13 11:58 AM      Result Value Range   Glucose-Capillary 120 (*) 70 - 99 mg/dL   Comment 1 Notify RN    GLUCOSE, CAPILLARY     Status: Abnormal   Collection Time    07/29/13  4:38 PM      Result Value Range   Glucose-Capillary 187 (*) 70 - 99 mg/dL  GLUCOSE, CAPILLARY     Status: Abnormal   Collection Time    07/29/13  8:23 PM      Result Value Range   Glucose-Capillary 183 (*) 70 - 99 mg/dL  GLUCOSE, CAPILLARY     Status: Abnormal   Collection Time    07/30/13  7:14 AM      Result Value Range   Glucose-Capillary 115 (*) 70 - 99 mg/dL   Comment 1 Notify RN       HEENT: normal Cardio: RRR and no murmur Resp: CTA B/L and unlabored GI: BS positive and non distended Extremity:  No Edema Skin:   Wound R BK stump medial eschar loosening, no erythema or drainage Neuro: Alert/Oriented, Anxious, Abnormal Motor 3-/5 R delt , bi, tri, grip, 4/5 R HF, 5/5 in L delt bi tri grip,4/5 L HF knee ext and 3- ankle DF and Tone:  Within Normal Limits Musc/Skel:  Extremity tender R stump mild tenderness, Left distal foot Gen NAD   Assessment/Plan: 1. Functional deficits secondary to R BKA and deconditioning which require  3+ hours per day of interdisciplinary therapy in a comprehensive inpatient rehab setting. Physiatrist is providing close team supervision and 24 hour management of active medical problems listed below. Physiatrist and rehab team continue to assess barriers to discharge/monitor patient progress toward functional and medical goals. FIM: FIM - Bathing Bathing Steps Patient Completed: Chest;Right Arm;Left Arm;Abdomen;Left upper leg;Front perineal area;Right upper leg Bathing: 4: Min-Patient completes 8-9 24f 10 parts or 75+ percent  FIM - Upper Body Dressing/Undressing Upper body dressing/undressing steps patient completed: Thread/unthread right sleeve of pullover shirt/dresss;Thread/unthread  left sleeve of pullover shirt/dress;Put head through opening of pull over shirt/dress;Pull shirt over trunk Upper body dressing/undressing: 5: Set-up assist to: Obtain clothing/put away FIM - Lower Body Dressing/Undressing Lower body dressing/undressing steps patient completed: Thread/unthread right pants leg Lower body dressing/undressing: 2: Max-Patient completed 25-49% of tasks  FIM - Toileting Toileting: 1: Total-Patient completed zero steps, helper did all 3  FIM - Diplomatic Services operational officer Devices: Therapist, music Transfers: 1-Two helpers  FIM - Banker Devices: Arm rests Bed/Chair Transfer: 1: Two helpers  FIM - Locomotion: Wheelchair Distance: 100' Locomotion: Wheelchair: 2: Travels 50 - 149 ft with supervision, cueing or coaxing FIM - Locomotion: Ambulation Ambulation/Gait Assistance: Not tested (comment) Locomotion: Ambulation: 0: Activity did not occur  Comprehension Comprehension Mode: Auditory Comprehension: 6-Follows complex conversation/direction: With extra time/assistive device  Expression Expression Mode: Verbal Expression: 6-Expresses complex ideas: With extra time/assistive device  Social Interaction Social Interaction: 6-Interacts appropriately with others with medication or extra time (anti-anxiety, antidepressant).  Problem Solving Problem Solving: 5-Solves complex 90% of the time/cues < 10% of the time  Memory Memory: 6-Assistive device: No helper  Medical Problem List and Plan:  1. right BKA due to her Charcot foot/abscess complicated by wound dehiscence after fall, prior left CVA with residual right-sided weakness  2. DVT Prophylaxis/Anticoagulation: SCDs left lower extremity  3. Pain Management: Presently Tylenol only. Will monitor with increased therapy  4. Mood: Xanax 0.25 mg twice a day as needed. Provide emotional support  5. Neuropsych: This patient is capable of making decisions  on his own behalf.  6. Recent multi-toe amputation left foot February 2014. dressing changes followed by wound care nurse. Increase frequency as needed to keep wounds clean and dry.  7. ID. Sepsis/MSSA bacteremia. Ancef completes today- monitor clinically going forward  8. CAD/NSTEMIx2. Continue aspirin therapy. Followup cardiothoracic surgery to consider CABG in the near future  9. Diastolic congestive heart failure. resume intravenous Lasix therapy as directed for now. Cardiology services will continue to follow and adjust Lasix as needed. Check daily weights with I's and O's.  10. Diabetes mellitus peripheral neuropathy. Hemoglobin A1c 8.2 Levemir 23 units each bedtime. Check blood sugars a.c. and at bedtime  11. Hypertension. Hydralazine 25 mg every 6 hours, Coreg 25 mg twice a day. Monitor with increased mobility  12. Acute renal failure. Renal function improved with latest creatinine 1.54. Followup chemistries  13. Acute blood loss anemia. Latest hemoglobin 7.2. Transfuse if symptomatic, check Orthostatic vitals   LOS (Days) 4 A FACE TO FACE EVALUATION WAS PERFORMED  Albert [name] 07/30/2013, 8:11 AM

## 2013-07-30 NOTE — Progress Notes (Signed)
     Subjective:  No chest pain or increased dyspnea. Objective:  Vital Signs in the last 24 hours: Temp:  [98 F (36.7 C)-98.1 F (36.7 C)] 98.1 F (36.7 C) (10/14 0440) Pulse Rate:  [72-86] 72 (10/14 0910) Resp:  [17-18] 17 (10/14 0440) BP: (114-131)/(79-88) 114/81 mmHg (10/14 0910) SpO2:  [95 %-96 %] 96 % (10/14 0440)  Intake/Output from previous day: 10/13 0701 - 10/14 0700 In: 720 [P.O.:720] Out: 700 [Urine:700] Intake/Output from this shift: Total I/O In: 240 [P.O.:240] Out: -   . aspirin  81 mg Oral Daily  . carvedilol  25 mg Oral BID WC  . famotidine  20 mg Oral BID  . furosemide  60 mg Intravenous BID  . insulin aspart  0-15 Units Subcutaneous TID WC  . insulin detemir  23 Units Subcutaneous QHS  . polyethylene glycol  17 g Oral Daily  . potassium chloride  10 mEq Oral BID  . senna-docusate  1 tablet Oral BID      Physical Exam: The patient appears to be in no distress.  Chest CTA  Heart reveals S3 gallop.  The abdomen is nontender.  Extremities 3+ edema; s/p BKA  Neurologic exam is normal strength and no lateralizing weakness.  No sensory deficits.  Integument reveals no rash  Lab Results:  Recent Labs  07/29/13 0500  WBC 4.7  HGB 7.2*  PLT 211    Recent Labs  07/29/13 0500  NA 138  K 3.4*  CL 98  CO2 33*  GLUCOSE 108*  BUN 52*  CREATININE 1.37*   Hepatic Function Panel  Recent Labs  07/29/13 0500  PROT 5.7*  ALBUMIN 2.5*  AST 22  ALT 12  ALKPHOS 63  BILITOT 0.4    Imaging: Imaging results have been reviewed  Cardiac Studies: Telemetry shows sinus tachycardia. Assessment/Plan:  1. Recent sepsis/MSSA bacteremia (adm 9/9->9/17->CIR) from gangrenous R foot s/p R BKA 06/29/13, TEE negative for vegetation 07/03/13  2. 3-vessel CAD by cath 06/28/13; plan eval for CABG once he recovers from present illness. Continue carvedilol  12.5 mg bid. Continue ASA; add statin. 3. Ischemic cardiomyopathy with acute systolic CHF (EF  40-45% by echo on 07/15/13. Moderate mitral regurgitation by TEE 07/03/13 . Markedly volume overloaded; resume lasix 60 IV BID and follow renal function. No ACEI given renal insufficiency. Add low dose hydralazine today and nitrates later if BP allows. 4. Acute hypoxic respiratory failure improved 5. Acute symptomatic anemia. Hemoglobin stable.   6. Acute renal failure- follow renal function with diuresis.    LOS: 4 days    [name] 07/30/2013, 12:11 PM

## 2013-07-30 NOTE — Progress Notes (Signed)
Occupational Therapy Session Note  Patient Details  Name: Albert [name] MRN: [id number] Date of Birth: [birth year]  Today's Date: 07/30/2013 Time: 0830-0930 4098-1191 Time Calculation (min): 60 min and 45 min   Short Term Goals: Week 1:  OT Short Term Goal 1 (Week 1): Pt. will complete LB dressing with minimal assist.   OT Short Term Goal 2 (Week 1): Pt will complete toilet transfer to drop arm BSC with mod assist  OT Short Term Goal 3 (Week 1): pt. perform lateral leans for toileting with minimal assist OT Short Term Goal 4 (Week 1): Pt. will be minimal assist with toileting OT Short Term Goal 5 (Week 1): Pt. will maintain dynamic sitting balance with minimal assist.     Skilled Therapeutic Interventions/Progress Updates:    Session 1: Therapy session focused on functional transfers, bed mobility, activity tolerance, and various techniques for LB dressing. Pt received supine in bed reporting 6/10 back pain. Completed supine>sit with supervision and BP 126/80 with HR 101. Complete sponge bath while sitting EOB. Utilized rolling in bed technique and supine in bed for peri hygiene as pt becomes quickly fatigued when using lateral lean technique. Pt able to wash peri area more thoroughly however still required min assist from OT. Pt using LH sponge however difficulty with grip when holding with RUE secondary to hx of CVA. Used reacher for threading BLE into pants. Attempted lateral lean for management around waist and pt quickly fatiguing. Provided encouragement and education on lying supine in bed however pt declining attempting technique. Required assist to completely manage clothing around waist. Pt with poor frustration tolerance during therapy session and required rest breaks d/t frustration and fatigue. At end of session pt required total assist +1 for squat pivot transfer with 2 trials to complete.   Session 2: Therapy session focused functional transfers and activity tolerance. Received pt  sitting in w/c. Pt discussing being overwhelmed by various doctors coming and speaking to him. Pt demonstrating good initiative to direct care as he asked his "heart doctor" to write his name down. Pt had various notes written on schedules of reminders and information provided. Encouraged pt to continue to do this. Setup bedside commode and pt reporting w/c will not fit into bathroom at home and plans on using [facility] 24/7. Pt very anxious about toilet transfer to [facility] secondary to fall during last stay in IP during toilet transfer. Discussed setup and technique thoroughly with pt and he agreed to all steps reporting feeling comfortable about. Reassured pt by having another staff member behind him if needed. Completed squat pivot transfer w/c>BSC with max assist going to L. Pt required +2 assist transferring back to w/c as he became anxious and went into extension. Placed dycem under LLE. Pt then transferred to gym via w/c and engaged in ergonometer activity for UE strengthening and activity tolerance. Engaged in activity for 7 min with 1 rest break. Level 5 with RPM 27-32.   Therapy Documentation Precautions:  Precautions Precautions: Fall Precaution Comments: R BKA, history of right hemiparesis secondary to old CVA Restrictions Weight Bearing Restrictions: Yes RLE Weight Bearing: Non weight bearing Other Position/Activity Restrictions: elevate operated limb, do not prop pillows under knee; try to position on LEFT side so that RIGHT hip can be in neutral/extension General:   Vital Signs: Therapy Vitals Pulse Rate: 72 BP: 114/81 mmHg (sitting) Pain: Pt with 6/10 back pain during both session however pt does not appear to have 6/10 pain during functional activities.   See  FIM for current functional status  Therapy/Group: Individual Therapy  Daneil Dan 07/30/2013, 12:18 PM

## 2013-07-31 ENCOUNTER — Inpatient Hospital Stay (HOSPITAL_COMMUNITY): Payer: 59 | Admitting: *Deleted

## 2013-07-31 ENCOUNTER — Inpatient Hospital Stay (HOSPITAL_COMMUNITY): Payer: 59

## 2013-07-31 DIAGNOSIS — I2589 Other forms of chronic ischemic heart disease: Secondary | ICD-10-CM

## 2013-07-31 DIAGNOSIS — J9600 Acute respiratory failure, unspecified whether with hypoxia or hypercapnia: Secondary | ICD-10-CM

## 2013-07-31 DIAGNOSIS — I2510 Atherosclerotic heart disease of native coronary artery without angina pectoris: Secondary | ICD-10-CM

## 2013-07-31 LAB — GLUCOSE, CAPILLARY
Glucose-Capillary: 144 mg/dL — ABNORMAL HIGH (ref 70–99)
Glucose-Capillary: 132 mg/dL — ABNORMAL HIGH (ref 70–99)
Glucose-Capillary: 101 mg/dL — ABNORMAL HIGH (ref 70–99)
Glucose-Capillary: 145 mg/dL — ABNORMAL HIGH (ref 70–99)

## 2013-07-31 LAB — BASIC METABOLIC PANEL
BUN: 48 mg/dL — ABNORMAL HIGH (ref 6–23)
CO2: 32 mEq/L (ref 19–32)
Chloride: 99 mEq/L (ref 96–112)
GFR calc Af Amer: 73 mL/min — ABNORMAL LOW (ref >90)
GFR calc non Af Amer: 63 mL/min — ABNORMAL LOW (ref >90)
Potassium: 3.8 mEq/L (ref 3.5–5.1)
Sodium: 139 mEq/L (ref 135–145)

## 2013-07-31 NOTE — Progress Notes (Signed)
     SUBJECTIVE: No chest pain or SOB.   BP 139/93  Pulse 87  Temp(Src) 97.4 F (36.3 C) (Oral)  Resp 18  Wt 260 lb 11.2 oz (118.253 kg)  BMI 37.41 kg/m2  SpO2 98%  Intake/Output Summary (Last 24 hours) at 07/31/13 1138 Last data filed at 07/31/13 0730  Gross per 24 hour  Intake   1080 ml  Output   3000 ml  Net  -1920 ml    PHYSICAL EXAM General: Well developed, well nourished, in no acute distress. Alert and oriented x 3.  Psych:  Good affect, responds appropriately Neck: No JVD. No masses noted.  Lungs: Clear bilaterally with no wheezes or rhonci noted.  Heart: RRR with no murmurs noted. Abdomen: Bowel sounds are present. Soft, non-tender.  Extremities: No left lower extremity edema. Right BKA   LABS: Basic Metabolic Panel:  Recent Labs  16/10/96 0500 07/31/13 0500  NA 138 139  K 3.4* 3.8  CL 98 99  CO2 33* 32  GLUCOSE 108* 146*  BUN 52* 48*  CREATININE 1.37* 1.30  CALCIUM 8.4 8.3*   CBC:  Recent Labs  07/29/13 0500  WBC 4.7  NEUTROABS 3.0  HGB 7.2*  HCT 23.6*  MCV 84.3  PLT 211   Current Meds: . aspirin  81 mg Oral Daily  . atorvastatin  40 mg Oral q1800  . carvedilol  25 mg Oral BID WC  . famotidine  20 mg Oral BID  . furosemide  60 mg Intravenous BID  . hydrALAZINE  10 mg Oral Q6H  . insulin aspart  0-15 Units Subcutaneous TID WC  . insulin detemir  23 Units Subcutaneous QHS  . polyethylene glycol  17 g Oral Daily  . potassium chloride  10 mEq Oral BID  . senna-docusate  1 tablet Oral BID     ASSESSMENT AND PLAN:  1. Recent sepsis/MSSA bacteremia: (adm 9/9->9/17->CIR) from gangrenous R foot s/p R BKA 06/29/13, TEE negative for vegetation 07/03/13   2. CAD: 3-vessel CAD by cath 06/28/13. He will need CABG when he recovers from present illness. Continue beta blocker, ASA and statin.   3. Ischemic cardiomyopathy with acute systolic CHF:  LVEF 40-45% by echo on 07/15/13. Moderate mitral regurgitation by TEE 07/03/13 . Pt remains volume  overloaded. Good diuresis last 24 hours with IV Lasix. Continue Lasix 60 IV BID and follow renal function. No ACEI given renal insufficiency. Continue hydralazine for afterload reduction.   4. Acute hypoxic respiratory failure: Improved   5. Acute renal failure Resolved. Follow renal function closely with diuresis.  6. Anemia: H/H stable.    MCALHANY,CHRISTOPHER  10/15/201411:38 AM

## 2013-07-31 NOTE — Progress Notes (Signed)
Social Work Patient ID: [name], male   DOB: 16-Sep-1961, 52 y.o.   MRN: [id number] Met with pt and left a message for his wife to inform of team conference goals of supervision/min level and discharge 10/23.  Pt is pleased with this date and feels It is doable.  He will have his son and wife come in and attend therapies prior to his discharge.  Still discussing home health versus OP therapies depend upon his voiding Issue.  MD to remove the foley on Monday.

## 2013-07-31 NOTE — Progress Notes (Signed)
I have reviewed and agree with the treatment as reflected in this note. Seif Teichert, PT DPT  

## 2013-07-31 NOTE — Progress Notes (Signed)
Orthopedic Tech Progress Note Patient Details:  Albert [name] 09-08-1961 [id number]  Patient ID: [name], male   DOB: [birth year], 52 y.o.   MRN: [id number] Called [employer] with brace order; spoke with Richardean Chimera, Sven Pinheiro 07/31/2013, 1:04 PM

## 2013-07-31 NOTE — Plan of Care (Signed)
Problem: RH Toileting Goal: LTG Patient will perform toileting w/assist, cues/equip (OT) LTG: Patient will perform toiletiing (clothes management/hygiene) with assist, with/without cues using equipment (OT)  Downgraded goal to mod assist as pt will needs assist with hygiene for thoroughness.

## 2013-07-31 NOTE — Plan of Care (Signed)
Problem: RH Balance Goal: LTG Patient will maintain dynamic standing with ADLs (OT) LTG: Patient will maintain dynamic standing balance with assist during activities of daily living (OT)  Outcome: Not Applicable Date Met:  07/31/13 Pt completing all LB self care tasks using lateral lean technique and while supine in bed. Pt completing all transfers via sliding board.

## 2013-07-31 NOTE — Progress Notes (Signed)
Occupational Therapy Session Note  Patient Details  Name: Albert [name] MRN: [id number] Date of Birth: 1961-02-16  Today's Date: 07/31/2013 Time: 4098-1191 and 1117-1200 Time Calculation (min): 60 min and 43 min   Short Term Goals: Week 1:  OT Short Term Goal 1 (Week 1): Pt. will complete LB dressing with minimal assist.   OT Short Term Goal 2 (Week 1): Pt will complete toilet transfer to drop arm BSC with mod assist  OT Short Term Goal 3 (Week 1): pt. perform lateral leans for toileting with minimal assist OT Short Term Goal 4 (Week 1): Pt. will be minimal assist with toileting OT Short Term Goal 5 (Week 1): Pt. will maintain dynamic sitting balance with minimal assist.     Skilled Therapeutic Interventions/Progress Updates:    Session 1: Therapy session focused on bed mobility for ADL retraining and functional transfers. Pt engaged in LB dressing and bathing while supine in bed and able to complete tasks with setup assist and used reacher for LB dressing. Pt required frequent rest breaks however not fatiguing as quickly when completing LB ADLs supine in bed. Engaged in UB dressing and bathing while sitting EOB at setup assist. Completed squat pivot transfer bed>w/c max assist. Completed grooming tasks from w/c level at sink.   Session 2: Therapy session focused functional transfers (specifically BSC), lateral leans for LB clothing management, and retrograde massage for edema control in RUE. Pt received sitting in w/c. Completed sliding board transfer w/c>bed with supervision. Completed sliding board transfer bed>BSC with steadying assist for safety as pt anxious with BSC transfer. Completed sliding board transfer>BSC with supervision. Pt required assist for placement of board on all occasions. Completed lateral leans to each side and holding approx 5 seconds for practice with LB clothing management. Pt required rest breaks throughout session secondary to fatigue. Provided retrograde massage to  RUE at end of therapy session. Pt left with lunch tray and pillow for elevation of RUE.   Therapy Documentation Precautions:  Precautions Precautions: Fall Precaution Comments: R BKA, history of right hemiparesis secondary to old CVA Restrictions Weight Bearing Restrictions: Yes RLE Weight Bearing: Non weight bearing Other Position/Activity Restrictions: elevate operated limb, do not prop pillows under knee; try to position on LEFT side so that RIGHT hip can be in neutral/extension General:   Vital Signs:   Pain: Pt reported 6-7/10 pain in lower back. RN provided pain meds at beginning of session. No c/o pain during therapy session 2.   See FIM for current functional status  Therapy/Group: Individual Therapy  Daneil Dan 07/31/2013, 9:28 AM

## 2013-07-31 NOTE — Patient Care Conference (Signed)
Inpatient RehabilitationTeam Conference and Plan of Care Update Date: 07/31/2013   Time: 11:15 Am    Patient Name: Albert [name]      Medical Record Number: [id number]  Date of Birth: April 07, 1961 Sex: Male         Room/Bed: 4W01C/4W01C-01 Payor Info: Payor: Advertising copywriter / Plan: Advertising copywriter / Product Type: *No Product type* /    Admitting Diagnosis: R BKA  Admit Date/Time:  07/26/2013  1:46 PM Admission Comments: No comment available   Primary Diagnosis:  Hx of right BKA Principal Problem: Hx of right BKA  Patient Active Problem List   Diagnosis Date Noted  . Hx of right BKA 07/26/2013  . Anemia 07/15/2013  . Hyperkalemia 07/15/2013  . Hyposmolality and/or hyponatremia 07/15/2013  . Acute respiratory failure 07/15/2013  . Mitral regurgitation 07/15/2013  . Oliguria 07/15/2013  . Bacteremia due to Staphylococcus aureus 07/15/2013  . Hyperchloremia 07/15/2013  . Acute kidney failure 07/14/2013  . Hypotension, unspecified 07/14/2013  . Acute systolic heart failure 07/12/2013  . Acute pulmonary edema 07/11/2013  . Pneumonia, organism unspecified 07/11/2013  . Hypoxemia 07/11/2013  . Hx of BKA 07/04/2013  . Cardiomyopathy, ischemic 07/03/2013  . Coronary atherosclerosis of native coronary artery 07/01/2013  . NSTEMI (non-ST elevated myocardial infarction) 06/26/2013  . AKI (acute kidney injury) 06/25/2013  . Foot abscess, right 06/25/2013  . Shock circulatory 06/25/2013  . Dehydration with hyponatremia 06/25/2013  . Leukocytosis, unspecified 06/25/2013  . Sepsis(995.91) 06/25/2013  . Aftercare following surgery of the circulatory system, NEC 02/11/2013  . Peripheral vascular disease, unspecified 12/31/2012  . Diabetes mellitus out of control 12/17/2012  . Essential hypertension, benign 12/17/2012  . Atherosclerotic peripheral vascular disease 12/17/2012  . Gangrene 12/08/2012  . Hypercholesteremia     Expected Discharge Date: Expected Discharge Date:  08/08/13  Team Members Present: Physician leading conference: Dr. Claudette Laws Social Worker Present: Dossie Der, LCSW;Jenny Prevatt, LCSW Nurse Present: Ethelene Browns, RN PT Present: Cyndia Skeeters, Lillie Columbia, PT OT Present: Bretta Bang, Carollee Sires, OT SLP Present: Maxcine Ham, SLP PPS Coordinator present : Edson Snowball, Chapman Fitch, RN, CRRN     Current Status/Progress Goal Weekly Team Focus  Medical   Edema improving on IV Lasix, improved the left foot wound, stump still with lateral aspect eschar with bleeding  Adequate pain control reduce edema, promote wound healing  Monitor her right stump, monitor hemoglobin, cardiology to assist with diuresis   Bowel/Bladder   Continent of bowel; LBM 07/28/13; Foley cath to SD for urinary retention  to regain bladder function; timed toileting   monitor and assess urinary function and encourage timed toileting    Swallow/Nutrition/ Hydration     WFL        ADL's   max assist-+2 assist (+2 bc of pt's high anxiety), mod assist LB dressing, setup assist UB dressing, min assist bathing, decreased activity tolerance, poor frustration tolerance, high anxiety with toilet transfers   supervision-Mod I   LB dressing, activity tolerance, functional transfers, stregnthening, education    Mobility   supervision level bed and wheelchair mobility, maxA squat pivot transfers.   modI-supervision at wheelchair level.   bed mobility, transfers, wheelchair mobility, strengthening, coordination, balance, safety, activity tolerance.   Communication     WFL        Safety/Cognition/ Behavioral Observations    WFL        Pain   pt denies pain  pain <  3  assess and monitor pain qshift and prn  Skin   L foot with Aquacel; dry dressing and Kerlix; WOC nurse changes QOD; R BKA with staples and dry dressing, change daily  pt skin will remain free from further breakdown/ infection   monitor and assess skin integrity qshift and  prn; dressing changes to be done as ordered and prn      *See Care Plan and progress notes for long and short-term goals.  Barriers to Discharge: IV antibiotics, severe coronary artery disease, wound care    Possible Resolutions to Barriers:  Continue rehabilitation program, monitor for cardiac events, appreciate cardiology assistance    Discharge Planning/Teaching Needs:  Home wiht wif eand son's providing care.  Aware nened ramp and are working on.  Also will need 24 hr physical care at discharge.      Team Discussion:  D/C foley Monday and see if can void.  Making good progress with sliding board transfers.  Need family education prior to discharge.  Skin issues look better.    Revisions to Treatment Plan:  None   Continued Need for Acute Rehabilitation Level of Care: The patient requires daily medical management by a physician with specialized training in physical medicine and rehabilitation for the following conditions: Daily direction of a multidisciplinary physical rehabilitation program to ensure safe treatment while eliciting the highest outcome that is of practical value to the patient.: Yes Daily medical management of patient stability for increased activity during participation in an intensive rehabilitation regime.: Yes Daily analysis of laboratory values and/or radiology reports with any subsequent need for medication adjustment of medical intervention for : Neurological problems;Post surgical problems;Cardiac problems  Keidan Aumiller, Lemar Livings 07/31/2013, 3:07 PM

## 2013-07-31 NOTE — Progress Notes (Signed)
Physical Therapy Session Note  Patient Details  Name: Albert [name] MRN: [id number] Date of Birth: 12/26/1960  Today's Date: 07/31/2013 Time: 0930-1025 and 1300-1330  Time Calculation (min): 55 min and   Short Term Goals: Week 1:  PT Short Term Goal 1 (Week 1): Pt will roll L with side rail, head of bed flat and S.  PT Short Term Goal 2 (Week 1): Pt will roll R with side rail, head of bed flat and min A.  PT Short Term Goal 3 (Week 1): Pt will transfer supine to edge of bed, edge of bed to supine with min A.  PT Short Term Goal 4 (Week 1): Pt will transfer bed to chair, chair to bed with or without sliding board and min A.  PT Short Term Goal 5 (Week 1): Pt will propel w/c about 50 feet with L UE and LE and S.   Skilled Therapeutic Interventions/Progress Updates:    Am session: Patient received seated in wheelchair, Darco shoe donned L LE. Session focused on functional transfers, wheelchair mobility and introduction to sliding board transfers. Dycem placed under patients L LE for all transfers to prevent slipping. Patient attempted wheelchair>level mat table +2totalA (to L side), transfer not completed and patient required to return to seated position in wheelchair, second attempt wheelchair>level mat table maxA(to L side), with increased verbal cues for anterior weight shift and increased weight bearing through L LE to improve clearance of bottom during transfer. Patient instructed in 2 sets of 5 reps seated tricep dips with blocks for increased B UE and L LE strength. Verbal cues for proper form and instruction for slow and controlled movements. Patient instructed in R and L lateral scooting on edge of mat table x2 each direction. Verbal/visual cues for increased anterior weight shift and increased push of with L LE to lift bottom. Patient instructed in mat table<>wheelchair transfer x 2 trials, each trial to wheelchair/mat from both sides (L & R), maxA, verbal/visual cues for anterior  weight shift and increased use of L LE for lifting bottom  Patient instructed in slide board transfer mat table <> wheelchair x2 trials (to R and L side from each surface) for a total of 4 transfers, supervision level, with set-up assistance for appropriate placement and removal of sliding board. Verbal/visual cues provided for hand placement and for lateral scooting along board to complete transfer. Verbal cues for hand placement and for using L LE to assist lateral scoot. Patient states he feels comfortable using the sliding board. Patient instructed in wheelchair mobility 59' with B UE supervision, verbal cues for safety. Patient returned to room, seated in wheelchair all needs within reach.   PM Session: Patient received seated EOB, R LE propped up on seat of wheelchair. Session focused on slide board transfers and LE strengthening. Dycem placed under L LE for all transfers to prevent slipping. Patient instructed in slide board transfer bed>wheelchair (to L side)  supervision with set up for placement and removal of sliding board. Patient totalA for wheelchair mobility room>gym. Patient instructed in slide board transfer wheelchair>level mat table (to R side) supervision, with assistance for placement and removal of sliding board. Patient instructed in seated B LAQs and B hip flexion 1 set 15 reps each on edge of mat. Patient instructed in slide board transfer mat table > wheelchair supervision level, with assistance for placement/removal of board. Patient returned to room, seated in wheelchair with all needs within reach.  Therapy Documentation Precautions:  Precautions Precautions:  Fall Precaution Comments: R BKA, history of right hemiparesis secondary to old CVA Restrictions Weight Bearing Restrictions: Yes RLE Weight Bearing: Non weight bearing Other Position/Activity Restrictions: elevate operated limb, do not prop pillows under knee; try to position on LEFT side so that RIGHT hip can be in  neutral/extension   Pain: Pain Assessment Pain Assessment: No/denies pain Pain Score: 0-No pain   Locomotion : Wheelchair Mobility Distance: 37'   See FIM for current functional status  Therapy/Group: Individual Therapy  Doralee Albino 07/31/2013, 12:24 PM

## 2013-07-31 NOTE — Progress Notes (Signed)
52 year old patient admitted to rehabilitation following a right BKA. Medical problems include hypertension and diabetes as well his peripheral vascular disease. Hospital course complicated by acute kidney injury and NSTEMI. He has a history of coronary artery disease and CABG is being considered post recovery for his triple-vessel disease.  Subjective/Complaints: Patient without shortness of breath. No pain complaints.  Objective: Nursing noted increased bleeding medial aspect of the right stump Vital Signs: Blood pressure 139/93, pulse 87, temperature 97.4 F (36.3 C), temperature source Oral, resp. rate 18, weight 118.253 kg (260 lb 11.2 oz), SpO2 98.00%. No results found. Results for orders placed during the hospital encounter of 07/26/13 (from the past 72 hour(s))  GLUCOSE, CAPILLARY     Status: Abnormal   Collection Time    07/28/13 11:49 AM      Result Value Range   Glucose-Capillary 125 (*) 70 - 99 mg/dL   Comment 1 Notify RN    GLUCOSE, CAPILLARY     Status: Abnormal   Collection Time    07/28/13  4:20 PM      Result Value Range   Glucose-Capillary 125 (*) 70 - 99 mg/dL   Comment 1 Notify RN    GLUCOSE, CAPILLARY     Status: Abnormal   Collection Time    07/28/13  8:37 PM      Result Value Range   Glucose-Capillary 146 (*) 70 - 99 mg/dL   Comment 1 Notify RN    CBC WITH DIFFERENTIAL     Status: Abnormal   Collection Time    07/29/13  5:00 AM      Result Value Range   WBC 4.7  4.0 - 10.5 K/uL   RBC 2.80 (*) 4.22 - 5.81 MIL/uL   Hemoglobin 7.2 (*) 13.0 - 17.0 g/dL   HCT 16.1 (*) 09.6 - 04.5 %   MCV 84.3  78.0 - 100.0 fL   MCH 25.7 (*) 26.0 - 34.0 pg   MCHC 30.5  30.0 - 36.0 g/dL   RDW 40.9 (*) 81.1 - 91.4 %   Platelets 211  150 - 400 K/uL   Neutrophils Relative % 63  43 - 77 %   Neutro Abs 3.0  1.7 - 7.7 K/uL   Lymphocytes Relative 25  12 - 46 %   Lymphs Abs 1.2  0.7 - 4.0 K/uL   Monocytes Relative 9  3 - 12 %   Monocytes Absolute 0.4  0.1 - 1.0 K/uL   Eosinophils Relative 3  0 - 5 %   Eosinophils Absolute 0.1  0.0 - 0.7 K/uL   Basophils Relative 0  0 - 1 %   Basophils Absolute 0.0  0.0 - 0.1 K/uL  COMPREHENSIVE METABOLIC PANEL     Status: Abnormal   Collection Time    07/29/13  5:00 AM      Result Value Range   Sodium 138  135 - 145 mEq/L   Potassium 3.4 (*) 3.5 - 5.1 mEq/L   Chloride 98  96 - 112 mEq/L   CO2 33 (*) 19 - 32 mEq/L   Glucose, Bld 108 (*) 70 - 99 mg/dL   BUN 52 (*) 6 - 23 mg/dL   Creatinine, Ser 7.82 (*) 0.50 - 1.35 mg/dL   Calcium 8.4  8.4 - 95.6 mg/dL   Total Protein 5.7 (*) 6.0 - 8.3 g/dL   Albumin 2.5 (*) 3.5 - 5.2 g/dL   AST 22  0 - 37 U/L   ALT 12  0 - 53  U/L   Alkaline Phosphatase 63  39 - 117 U/L   Total Bilirubin 0.4  0.3 - 1.2 mg/dL   GFR calc non Af Amer 59 (*) >90 mL/min   GFR calc Af Amer 68 (*) >90 mL/min   Comment: (NOTE)     The eGFR has been calculated using the CKD EPI equation.     This calculation has not been validated in all clinical situations.     eGFR's persistently <90 mL/min signify possible Chronic Kidney     Disease.  GLUCOSE, CAPILLARY     Status: Abnormal   Collection Time    07/29/13  7:13 AM      Result Value Range   Glucose-Capillary 102 (*) 70 - 99 mg/dL   Comment 1 Notify RN    GLUCOSE, CAPILLARY     Status: Abnormal   Collection Time    07/29/13 11:58 AM      Result Value Range   Glucose-Capillary 120 (*) 70 - 99 mg/dL   Comment 1 Notify RN    GLUCOSE, CAPILLARY     Status: Abnormal   Collection Time    07/29/13  4:38 PM      Result Value Range   Glucose-Capillary 187 (*) 70 - 99 mg/dL  GLUCOSE, CAPILLARY     Status: Abnormal   Collection Time    07/29/13  8:23 PM      Result Value Range   Glucose-Capillary 183 (*) 70 - 99 mg/dL  GLUCOSE, CAPILLARY     Status: Abnormal   Collection Time    07/30/13  7:14 AM      Result Value Range   Glucose-Capillary 115 (*) 70 - 99 mg/dL   Comment 1 Notify RN    GLUCOSE, CAPILLARY     Status: Abnormal   Collection Time     07/30/13 11:21 AM      Result Value Range   Glucose-Capillary 110 (*) 70 - 99 mg/dL   Comment 1 Notify RN    GLUCOSE, CAPILLARY     Status: Abnormal   Collection Time    07/30/13  4:53 PM      Result Value Range   Glucose-Capillary 139 (*) 70 - 99 mg/dL  GLUCOSE, CAPILLARY     Status: Abnormal   Collection Time    07/30/13  9:11 PM      Result Value Range   Glucose-Capillary 160 (*) 70 - 99 mg/dL  BASIC METABOLIC PANEL     Status: Abnormal   Collection Time    07/31/13  5:00 AM      Result Value Range   Sodium 139  135 - 145 mEq/L   Potassium 3.8  3.5 - 5.1 mEq/L   Chloride 99  96 - 112 mEq/L   CO2 32  19 - 32 mEq/L   Glucose, Bld 146 (*) 70 - 99 mg/dL   BUN 48 (*) 6 - 23 mg/dL   Creatinine, Ser 1.61  0.50 - 1.35 mg/dL   Calcium 8.3 (*) 8.4 - 10.5 mg/dL   GFR calc non Af Amer 63 (*) >90 mL/min   GFR calc Af Amer 73 (*) >90 mL/min   Comment: (NOTE)     The eGFR has been calculated using the CKD EPI equation.     This calculation has not been validated in all clinical situations.     eGFR's persistently <90 mL/min signify possible Chronic Kidney     Disease.  GLUCOSE, CAPILLARY     Status: Abnormal  Collection Time    07/31/13  7:09 AM      Result Value Range   Glucose-Capillary 144 (*) 70 - 99 mg/dL   Comment 1 Notify RN       HEENT: normal Cardio: RRR and no murmur Resp: CTA B/L and unlabored GI: BS positive and non distended Extremity:  No Edema Skin:   Wound R BK stump medial eschar loosening, no erythema tried bloody drainage on dressing, left distal foot status post digits 2 through 5 amputation. Small open area at midpoint distal no drainage no odor Neuro: Alert/Oriented, Anxious, Abnormal Motor 3-/5 R delt , bi, tri, grip, 4/5 R HF, 5/5 in L delt bi tri grip,4/5 L HF knee ext and 3- ankle DF and Tone:  Within Normal Limits Musc/Skel:  Extremity tender R stump mild tenderness, Left distal foot Gen NAD   Assessment/Plan: 1. Functional deficits secondary to R  BKA and deconditioning which require 3+ hours per day of interdisciplinary therapy in a comprehensive inpatient rehab setting. Physiatrist is providing close team supervision and 24 hour management of active medical problems listed below. Physiatrist and rehab team continue to assess barriers to discharge/monitor patient progress toward functional and medical goals. FIM: FIM - Bathing Bathing Steps Patient Completed: Chest;Right Arm;Left Arm;Abdomen;Left upper leg;Right upper leg;Right lower leg (including foot);Left lower leg (including foot) Bathing: 4: Min-Patient completes 8-9 92f 10 parts or 75+ percent  FIM - Upper Body Dressing/Undressing Upper body dressing/undressing steps patient completed: Thread/unthread right sleeve of pullover shirt/dresss;Thread/unthread left sleeve of pullover shirt/dress;Put head through opening of pull over shirt/dress;Pull shirt over trunk Upper body dressing/undressing: 5: Set-up assist to: Obtain clothing/put away FIM - Lower Body Dressing/Undressing Lower body dressing/undressing steps patient completed: Thread/unthread right pants leg;Thread/unthread left pants leg Lower body dressing/undressing: 3: Mod-Patient completed 50-74% of tasks  FIM - Toileting Toileting: 1: Total-Patient completed zero steps, helper did all 3  FIM - Diplomatic Services operational officer Devices: Bedside commode Toilet Transfers: 2-To toilet/BSC: Max A (lift and lower assist);1-Two helpers  FIM - Banker Devices: Arm rests Bed/Chair Transfer: 2: Bed > Chair or W/C: Max A (lift and lower assist);2: Chair or W/C > Bed: Max A (lift and lower assist)  FIM - Locomotion: Wheelchair Distance: 125' Locomotion: Wheelchair: 1: Total Assistance/staff pushes wheelchair (Pt<25%) FIM - Locomotion: Ambulation Ambulation/Gait Assistance: Not tested (comment) Locomotion: Ambulation: 0: Activity did not occur  Comprehension Comprehension  Mode: Auditory Comprehension: 6-Follows complex conversation/direction: With extra time/assistive device  Expression Expression Mode: Verbal Expression: 6-Expresses complex ideas: With extra time/assistive device  Social Interaction Social Interaction: 6-Interacts appropriately with others with medication or extra time (anti-anxiety, antidepressant).  Problem Solving Problem Solving: 5-Solves complex 90% of the time/cues < 10% of the time  Memory Memory: 6-Assistive device: No helper  Medical Problem List and Plan:  1. right BKA due to  Charcot foot/abscess complicated by wound dehiscence after fall, prior left CVA with residual right-sided weakness, cont dressing changes 2. DVT Prophylaxis/Anticoagulation: SCDs left lower extremity  3. Pain Management: Presently Tylenol only. Will monitor with increased therapy  4. Mood: Xanax 0.25 mg twice a day as needed. Provide emotional support  5. Neuropsych: This patient is capable of making decisions on his own behalf.  6. Recent multi-toe amputation left foot February 2014. dressing changes followed by wound care nurse. Improving 7. ID. Sepsis/MSSA bacteremia. Ancef completes today- monitor clinically going forward  8. CAD/NSTEMIx2. Continue aspirin therapy. Followup cardiothoracic surgery to consider CABG in the  near future  9. Diastolic congestive heart failure. resume intravenous Lasix therapy as directed for now. Cardiology services will continue to follow and adjust Lasix as needed. Check daily weights with I's and O's.  10. Diabetes mellitus peripheral neuropathy. Hemoglobin A1c 8.2 Levemir 23 units each bedtime. Check blood sugars a.c. and at bedtime  11. Hypertension. Hydralazine 25 mg every 6 hours, Coreg 25 mg twice a day. Monitor with increased mobility  12. Acute renal failure. Renal function improved with latest creatinine 1.54. Followup chemistries  13. Acute blood loss anemia. Latest hemoglobin 7.2. Transfuse if symptomatic, check  Orthostatic vitals   LOS (Days) 5 A FACE TO FACE EVALUATION WAS PERFORMED  Darsi Tien E 07/31/2013, 9:38 AM

## 2013-08-01 ENCOUNTER — Inpatient Hospital Stay (HOSPITAL_COMMUNITY): Payer: 59 | Admitting: Occupational Therapy

## 2013-08-01 ENCOUNTER — Inpatient Hospital Stay (HOSPITAL_COMMUNITY): Payer: 59

## 2013-08-01 ENCOUNTER — Inpatient Hospital Stay (HOSPITAL_COMMUNITY): Payer: 59 | Admitting: *Deleted

## 2013-08-01 LAB — CBC
HCT: 27 % — ABNORMAL LOW (ref 39.0–52.0)
Hemoglobin: 8.1 g/dL — ABNORMAL LOW (ref 13.0–17.0)
MCH: 25 pg — ABNORMAL LOW (ref 26.0–34.0)
MCV: 83.3 fL (ref 78.0–100.0)
Platelets: 300 10*3/uL (ref 150–400)
RBC: 3.24 MIL/uL — ABNORMAL LOW (ref 4.22–5.81)
WBC: 6.1 10*3/uL (ref 4.0–10.5)

## 2013-08-01 LAB — GLUCOSE, CAPILLARY
Glucose-Capillary: 88 mg/dL (ref 70–99)
Glucose-Capillary: 154 mg/dL — ABNORMAL HIGH (ref 70–99)

## 2013-08-01 NOTE — Progress Notes (Signed)
Occupational Therapy Session Note  Patient Details  Name: Albert [name] MRN: [id number] Date of Birth: [birth year]  Today's Date: 08/01/2013 Time: 1100-1145 Time Calculation (min): 45 min  Short Term Goals: Week 1:  OT Short Term Goal 1 (Week 1): Pt. will complete LB dressing with minimal assist.   OT Short Term Goal 2 (Week 1): Pt will complete toilet transfer to drop arm BSC with mod assist  OT Short Term Goal 3 (Week 1): pt. perform lateral leans for toileting with minimal assist OT Short Term Goal 4 (Week 1): Pt. will be minimal assist with toileting OT Short Term Goal 5 (Week 1): Pt. will maintain dynamic sitting balance with minimal assist.     Skilled Therapeutic Interventions/Progress Updates:  Patient resting in w/c upon arrival stating he was tired from earlier therapies.  Engaged in [facility] slide board transfers with focus on lateral leans for toileting tasks and w/c push-ups for pressure relief and strengthening of LLE and BUEs.  Patient required mod vcs for safe w/c placement next to HD drop arm BSC, assist to place,remove and stabilize SB.  Reviewed home set up and patien reports that his wife to rearrange a bedroom so he is not sure of exact layout.  Drew a picture of optimal layout to allow patient a safe and low surface to lean in to for successful pants down and up.  Left patient in w/c with all items within reach.  Therapy Documentation Precautions:  Precautions Precautions: Fall Precaution Comments: R BKA, history of right hemiparesis secondary to old CVA Restrictions Weight Bearing Restrictions: Yes RLE Weight Bearing: Non weight bearing Other Position/Activity Restrictions: elevate operated limb, do not prop pillows under knee; try to position on LEFT side so that RIGHT hip can be in neutral/extension Pain: No c/o pain See FIM for current functional status  Therapy/Group: Individual Therapy  Srihaan Mastrangelo 08/01/2013, 12:18 PM

## 2013-08-01 NOTE — Consult Note (Signed)
WOC wound follow up note Wound type: non healing surgical wound left mid foot.  Some blistering of the left great toe noted at time of admission. Measurement:0.5cm x 0.2cm x 0 scabbed area remains on the left mid foot and the blisters are resolving and dry Wound bed: no open wounds currently on the left foot. Drainage (amount, consistency, odor) none, I did express a tiny bit of serous drainage from under the scabbed area but nothing significant Periwound:intact with dry peeling skin, which I have removed today Dressing procedure/placement/frequency: paint with betadine daily, air dry. No topical dressing needed.   Discussed POC with patient and bedside nurse.  Re consult if needed, will not follow at this time. Thanks  Humzah Harty [employer], CWOCN [phone])

## 2013-08-01 NOTE — Progress Notes (Signed)
Occupational Therapy Session Note  Patient Details  Name: Albert [name] MRN: [id number] Date of Birth: Jun 23, 1961  Today's Date: 08/01/2013 Time: 0815-0900 Time Calculation (min): 45 min  Short Term Goals: Week 1:  OT Short Term Goal 1 (Week 1): Pt. will complete LB dressing with minimal assist.   OT Short Term Goal 2 (Week 1): Pt will complete toilet transfer to drop arm BSC with mod assist  OT Short Term Goal 3 (Week 1): pt. perform lateral leans for toileting with minimal assist OT Short Term Goal 4 (Week 1): Pt. will be minimal assist with toileting OT Short Term Goal 5 (Week 1): Pt. will maintain dynamic sitting balance with minimal assist.     Skilled Therapeutic Interventions/Progress Updates:    Therapy session focused on activity tolerance, functional transfers, and rolling in bed (L/R) during ADL retraining. Completed LB dressing and bathing while supine in bed with HOB raised requiring min verbal cues for technique. Used LH sponge bath to wash L foot. Completed UB dressing and hygiene with setup assist. Completed sliding board transfer bed>w/c with supervision and assist to place board. Pt required frequent rest breaks secondary to increased mucus in throat causing frequent coughing. Pt missed 15 min of skilled OT secondary to nursing care as pt's wound bleeding/drainage which was different from what nursing note had reported. Pt received with wound drainage and reported not bumping stump at any time. Pt left sitting in w/c with stump protector on and all needs in reach.   Therapy Documentation Precautions:  Precautions Precautions: Fall Precaution Comments: R BKA, history of right hemiparesis secondary to old CVA Restrictions Weight Bearing Restrictions: Yes RLE Weight Bearing: Non weight bearing Other Position/Activity Restrictions: elevate operated limb, do not prop pillows under knee; try to position on LEFT side so that RIGHT hip can be in  neutral/extension General: General Amount of Missed OT Time (min): 15 Minutes Vital Signs: Therapy Vitals Temp: 97.6 F (36.4 C) Temp src: Oral Pulse Rate: 95 Resp: 18 BP: 127/88 mmHg Patient Position, if appropriate: Sitting Oxygen Therapy SpO2: 96 % O2 Device: None (Room air) Pain: Pt reporting 6/10 back pain at beginning of session however by end of session reporting 4/10 back pain. RN aware and pt received pain meds around 0500.   See FIM for current functional status  Therapy/Group: Individual Therapy  Daneil Dan 08/01/2013, 8:49 AM

## 2013-08-01 NOTE — Progress Notes (Signed)
Occupational Therapy Session Note  Patient Details  Name: Albert [name] MRN: [id number] Date of Birth: 06/18/1961  Today's Date: 08/01/2013 Time: 900-929 Time Calculation (min): 29 min   Short Term Goals: Week 1:  OT Short Term Goal 1 (Week 1): Pt. will complete LB dressing with minimal assist.   OT Short Term Goal 2 (Week 1): Pt will complete toilet transfer to drop arm BSC with mod assist  OT Short Term Goal 3 (Week 1): pt. perform lateral leans for toileting with minimal assist OT Short Term Goal 4 (Week 1): Pt. will be minimal assist with toileting OT Short Term Goal 5 (Week 1): Pt. will maintain dynamic sitting balance with minimal assist.     Skilled Therapeutic Interventions/Progress Updates:  1:1 tx. No pain pre/post tx. Pt in w/c upon entering room willing to work w/OT. Pt able to self-propel w/c 356ft w/min A and increased time. Pt performed sliding board txfr to from reg bed w/Supervision and set-up of SB only. He was able to perform bed mob w/o physical assist or cues (sup<>sit; rolling side<>side). Pt needed assist donning limb guard x2 due to it coming off even though on correctly and firmly. Pt may need [employer] to add waist belt for better positioning and to secure limb guard when performing transfers and mobility.  Therapy Documentation Precautions:  Precautions Precautions: Fall Precaution Comments: R BKA, history of right hemiparesis secondary to old CVA Restrictions Weight Bearing Restrictions: Yes RLE Weight Bearing: Non weight bearing Other Position/Activity Restrictions: elevate operated limb, do not prop pillows under knee; try to position on LEFT side so that RIGHT hip can be in neutral/extension  Pain: Pain Assessment Pain Assessment: No/denies pain Pain Score: 0-No pain  See FIM for current functional status  Therapy/Group: Individual Therapy  Kennedy Brines, Deidre Ala 08/01/2013, 12:42 PM

## 2013-08-01 NOTE — Progress Notes (Signed)
Subjective:  Feels well. No chest pain. No dyspnea.  Objective:  Vital Signs in the last 24 hours: Temp:  [97.6 F (36.4 C)-98.1 F (36.7 C)] 97.6 F (36.4 C) (10/16 0504) Pulse Rate:  [88-95] 95 (10/16 0504) Resp:  [18] 18 (10/16 0504) BP: (118-128)/(80-88) 127/88 mmHg (10/16 0508) SpO2:  [96 %-97 %] 96 % (10/16 0504)  Intake/Output from previous day: 10/15 0701 - 10/16 0700 In: 600 [P.O.:600] Out: -  Intake/Output from this shift:    . aspirin  81 mg Oral Daily  . atorvastatin  40 mg Oral q1800  . carvedilol  25 mg Oral BID WC  . famotidine  20 mg Oral BID  . furosemide  60 mg Intravenous BID  . hydrALAZINE  10 mg Oral Q6H  . insulin aspart  0-15 Units Subcutaneous TID WC  . insulin detemir  23 Units Subcutaneous QHS  . polyethylene glycol  17 g Oral Daily  . senna-docusate  1 tablet Oral BID      Physical Exam: The patient appears to be in no distress.  Head and neck exam reveals that the pupils are equal and reactive.  The extraocular movements are full.  There is no scleral icterus.  Mouth and pharynx are benign.  No lymphadenopathy.  No carotid bruits.  The jugular venous pressure is normal.  Thyroid is not enlarged or tender.  Chest is clear to percussion and auscultation.  No rales or rhonchi.  Expansion of the chest is symmetrical.  Heart reveals no abnormal lift or heave.  First and second heart sounds are normal.  There is no murmur gallop rub or click.  The abdomen is soft and nontender.  Bowel sounds are normoactive.  There is no hepatosplenomegaly or mass.  There are no abdominal bruits.  Extremities reveal R BKA. 2+ edema left leg.  Neurologic exam is normal strength and no lateralizing weakness.  No sensory deficits.  Integument reveals no rash  Lab Results: No results found for this basename: WBC, HGB, PLT,  in the last 72 hours  Recent Labs  07/31/13 0500  NA 139  K 3.8  CL 99  CO2 32  GLUCOSE 146*  BUN 48*  CREATININE 1.30    No results found for this basename: TROPONINI, CK, MB,  in the last 72 hours Hepatic Function Panel No results found for this basename: PROT, ALBUMIN, AST, ALT, ALKPHOS, BILITOT, BILIDIR, IBILI,  in the last 72 hours No results found for this basename: CHOL,  in the last 72 hours No results found for this basename: PROTIME,  in the last 72 hours  Imaging: Imaging results have been reviewed  Cardiac Studies:  Assessment/Plan:  1. Recent sepsis/MSSA bacteremia: (adm 9/9->9/17->CIR) from gangrenous R foot s/p R BKA 06/29/13, TEE negative for vegetation 07/03/13  2. CAD: 3-vessel CAD by cath 06/28/13. He will need CABG when he recovers from present illness. Continue beta blocker, ASA and statin.  3. Ischemic cardiomyopathy with acute systolic CHF: LVEF 40-45% by echo on 07/15/13. Moderate mitral regurgitation by TEE 07/03/13 . Pt remains volume overloaded. Good diuresis last 24 hours with IV Lasix. Continue Lasix 60 IV BID and follow renal function. No ACEI given renal insufficiency. Continue hydralazine for afterload reduction.  4. Acute hypoxic respiratory failure: Improved  5. Acute renal failure Resolved. Follow renal function closely with diuresis. Renal function continues to improve. 6. Anemia: Follow.   LOS: 6 days    Cassell Clement 08/01/2013, 8:05 AM

## 2013-08-01 NOTE — Progress Notes (Signed)
I have reviewed and agree with the treatment as reflected in this note. Kailer Heindel, PT DPT  

## 2013-08-01 NOTE — Progress Notes (Signed)
52 year old patient admitted to rehabilitation following a right BKA. Medical problems include hypertension and diabetes as well his peripheral vascular disease. Hospital course complicated by acute kidney injury and NSTEMI. He has a history of coronary artery disease and CABG is being considered post recovery for his triple-vessel disease.  Subjective/Complaints: Back pain no limb pain  Objective: Nursing noted increased bleeding medial aspect of the right stump Vital Signs: Blood pressure 127/88, pulse 95, temperature 97.6 F (36.4 C), temperature source Oral, resp. rate 18, weight 118.253 kg (260 lb 11.2 oz), SpO2 96.00%. No results found. Results for orders placed during the hospital encounter of 07/26/13 (from the past 72 hour(s))  GLUCOSE, CAPILLARY     Status: Abnormal   Collection Time    07/29/13 11:58 AM      Result Value Range   Glucose-Capillary 120 (*) 70 - 99 mg/dL   Comment 1 Notify RN    GLUCOSE, CAPILLARY     Status: Abnormal   Collection Time    07/29/13  4:38 PM      Result Value Range   Glucose-Capillary 187 (*) 70 - 99 mg/dL  GLUCOSE, CAPILLARY     Status: Abnormal   Collection Time    07/29/13  8:23 PM      Result Value Range   Glucose-Capillary 183 (*) 70 - 99 mg/dL  GLUCOSE, CAPILLARY     Status: Abnormal   Collection Time    07/30/13  7:14 AM      Result Value Range   Glucose-Capillary 115 (*) 70 - 99 mg/dL   Comment 1 Notify RN    GLUCOSE, CAPILLARY     Status: Abnormal   Collection Time    07/30/13 11:21 AM      Result Value Range   Glucose-Capillary 110 (*) 70 - 99 mg/dL   Comment 1 Notify RN    GLUCOSE, CAPILLARY     Status: Abnormal   Collection Time    07/30/13  4:53 PM      Result Value Range   Glucose-Capillary 139 (*) 70 - 99 mg/dL  GLUCOSE, CAPILLARY     Status: Abnormal   Collection Time    07/30/13  9:11 PM      Result Value Range   Glucose-Capillary 160 (*) 70 - 99 mg/dL  BASIC METABOLIC PANEL     Status: Abnormal   Collection  Time    07/31/13  5:00 AM      Result Value Range   Sodium 139  135 - 145 mEq/L   Potassium 3.8  3.5 - 5.1 mEq/L   Chloride 99  96 - 112 mEq/L   CO2 32  19 - 32 mEq/L   Glucose, Bld 146 (*) 70 - 99 mg/dL   BUN 48 (*) 6 - 23 mg/dL   Creatinine, Ser 4.09  0.50 - 1.35 mg/dL   Calcium 8.3 (*) 8.4 - 10.5 mg/dL   GFR calc non Af Amer 63 (*) >90 mL/min   GFR calc Af Amer 73 (*) >90 mL/min   Comment: (NOTE)     The eGFR has been calculated using the CKD EPI equation.     This calculation has not been validated in all clinical situations.     eGFR's persistently <90 mL/min signify possible Chronic Kidney     Disease.  GLUCOSE, CAPILLARY     Status: Abnormal   Collection Time    07/31/13  7:09 AM      Result Value Range   Glucose-Capillary 144 (*)  70 - 99 mg/dL   Comment 1 Notify RN    GLUCOSE, CAPILLARY     Status: Abnormal   Collection Time    07/31/13 11:14 AM      Result Value Range   Glucose-Capillary 132 (*) 70 - 99 mg/dL   Comment 1 Notify RN    GLUCOSE, CAPILLARY     Status: Abnormal   Collection Time    07/31/13  4:40 PM      Result Value Range   Glucose-Capillary 101 (*) 70 - 99 mg/dL  GLUCOSE, CAPILLARY     Status: Abnormal   Collection Time    07/31/13  8:34 PM      Result Value Range   Glucose-Capillary 145 (*) 70 - 99 mg/dL  GLUCOSE, CAPILLARY     Status: Abnormal   Collection Time    08/01/13  7:12 AM      Result Value Range   Glucose-Capillary 105 (*) 70 - 99 mg/dL   Comment 1 Notify RN       HEENT: normal Cardio: RRR and no murmur Resp: CTA B/L and unlabored GI: BS positive and non distended Extremity:  No Edema Skin:   Wound R BK stump medial eschar loosening, no erythema tried bloody drainage on dressing, left distal foot status post digits 2 through 5 amputation. Small open area at midpoint distal no drainage no odor Neuro: Alert/Oriented, Anxious, Abnormal Motor 3-/5 R delt , bi, tri, grip, 4/5 R HF, 5/5 in L delt bi tri grip,4/5 L HF knee ext and 3-  ankle DF and Tone:  Within Normal Limits Musc/Skel:  Extremity tender R stump limb guard Gen NAD   Assessment/Plan: 1. Functional deficits secondary to R BKA and deconditioning which require 3+ hours per day of interdisciplinary therapy in a comprehensive inpatient rehab setting. Physiatrist is providing close team supervision and 24 hour management of active medical problems listed below. Physiatrist and rehab team continue to assess barriers to discharge/monitor patient progress toward functional and medical goals. FIM: FIM - Bathing Bathing Steps Patient Completed: Chest;Right Arm;Left Arm;Abdomen;Left upper leg;Right upper leg;Right lower leg (including foot);Left lower leg (including foot);Front perineal area;Buttocks Bathing: 5: Set-up assist to: Obtain items  FIM - Upper Body Dressing/Undressing Upper body dressing/undressing steps patient completed: Thread/unthread right sleeve of pullover shirt/dresss;Thread/unthread left sleeve of pullover shirt/dress;Put head through opening of pull over shirt/dress;Pull shirt over trunk Upper body dressing/undressing: 5: Set-up assist to: Obtain clothing/put away FIM - Lower Body Dressing/Undressing Lower body dressing/undressing steps patient completed: Thread/unthread right pants leg;Thread/unthread left pants leg;Pull underwear up/down Lower body dressing/undressing: 5: Set-up assist to: Don/Doff AFO/prosthesis/orthosis  FIM - Toileting Toileting: 1: Total-Patient completed zero steps, helper did all 3  FIM - Diplomatic Services operational officer Devices: Human resources officer Transfers: 4-To toilet/BSC: Min A (steadying Pt. > 75%);5-From toilet/BSC: Supervision (verbal cues/safety issues)  FIM - Banker Devices: Sliding board;Arm rests Bed/Chair Transfer: 5: Chair or W/C > Bed: Supervision (verbal cues/safety issues);5: Bed > Chair or W/C: Supervision (verbal cues/safety  issues)  FIM - Locomotion: Wheelchair Distance: 70' Locomotion: Wheelchair: 1: Total Assistance/staff pushes wheelchair (Pt<25%) FIM - Locomotion: Ambulation Ambulation/Gait Assistance: Not tested (comment) Locomotion: Ambulation: 0: Activity did not occur  Comprehension Comprehension Mode: Auditory Comprehension: 6-Follows complex conversation/direction: With extra time/assistive device  Expression Expression Mode: Verbal Expression: 6-Expresses complex ideas: With extra time/assistive device  Social Interaction Social Interaction: 6-Interacts appropriately with others with medication or extra time (anti-anxiety, antidepressant).  Problem Solving  Problem Solving: 5-Solves complex 90% of the time/cues < 10% of the time  Memory Memory: 6-Assistive device: No helper  Medical Problem List and Plan:  1. right BKA due to  Charcot foot/abscess complicated by wound dehiscence after fall, prior left CVA with residual right-sided weakness, cont dressing changes 2. DVT Prophylaxis/Anticoagulation: SCDs left lower extremity  3. Pain Management: Presently Tylenol only. Will monitor with increased therapy  4. Mood: Xanax 0.25 mg twice a day as needed. Provide emotional support  5. Neuropsych: This patient is capable of making decisions on his own behalf.  6. Recent multi-toe amputation left foot February 2014. dressing changes followed by wound care nurse. Improving 7. ID. Sepsis/MSSA bacteremia. Ancef completed 8. CAD/NSTEMIx2. Continue aspirin therapy. Followup cardiothoracic surgery to consider CABG in the near future  9. Diastolic congestive heart failure. resume intravenous Lasix therapy as directed for now. Cardiology services will continue to follow and adjust Lasix as needed. Check daily weights with I's and O's.  10. Diabetes mellitus peripheral neuropathy. Hemoglobin A1c 8.2 Levemir 23 units each bedtime. Check blood sugars a.c. and at bedtime  11. Hypertension. Hydralazine 25 mg  every 6 hours, Coreg 25 mg twice a day. Monitor with increased mobility  12. Acute renal failure. Renal function improved with latest creatinine 1.30. Followup chemistries  13. Acute blood loss anemia. Latest hemoglobin 7.2. Transfuse if symptomatic, check Orthostatic vitals   LOS (Days) 6 A FACE TO FACE EVALUATION WAS PERFORMED  Kielan Dreisbach E 08/01/2013, 7:59 AM

## 2013-08-01 NOTE — Progress Notes (Signed)
Physical Therapy Session Note  Patient Details  Name: Albert [name] MRN: [id number] Date of Birth: 1961-07-23  Today's Date: 08/01/2013 Time: 0930-1030 Time Calculation (min): 60 min  Short Term Goals: Week 1:  PT Short Term Goal 1 (Week 1): Pt will roll L with side rail, head of bed flat and S.  PT Short Term Goal 2 (Week 1): Pt will roll R with side rail, head of bed flat and min A.  PT Short Term Goal 3 (Week 1): Pt will transfer supine to edge of bed, edge of bed to supine with min A.  PT Short Term Goal 4 (Week 1): Pt will transfer bed to chair, chair to bed with or without sliding board and min A.  PT Short Term Goal 5 (Week 1): Pt will propel w/c about 50 feet with L UE and LE and S.   Skilled Therapeutic Interventions/Progress Updates:    Patient received in rehab gym seated in wheelchair, with limbguard donned (R LE) and Darco shoe L LE. Session focused on wheelchair mobility, B UE/LE strengthening, and functional transfers with sliding board. Patient instructed in 4x5reps seated wheelchair pushups for BUE/ LLE strengthening, verbal cues and visual demonstration for technique, for increased use/weight bearing through LLE to help lift bottom from chair and for controlled decent to facilitate eccentric control of triceps when lowering bottom. Patient instructed in seated B LE exercises, 2x15reps LAQs (1.5# on L LE) and 2x15 reps hip flexion (1.5# on L LE). Patients limb guard repeatedly sliding off during therapy session, therapist noted bleeding on lateral side of residual limb, nursing notified and nursing placed additional gauze on limb, and limb-guard re-donned. Patient instructed in wheelchair mobility 100' supervision level B UEs. Patient instructed in sliding board transfer wheelchair<>elevated mat table (R side to mat, L side to chair) supervision level with assistance for placement/removal of board. Verbal cues for hand placement and for use of L LE to help assist with lateral  scooting along board). Patient instructed in car transfer patient reports he has an SUV, but has option to be transported in sedan, wheelchair>low level car (L side) supervision, with assistance for placement/removal of board and placement of dycem under L LE to prevent slipping, verbal cues for hand placement. Car>wheelchair (R side) patient minA level with assistance for board placement/removal and assistance removing L LE from car. Verbal cues provided for placing L LE on floor prior to transfer, and for L hand placement on car seat to assist lateral scooting into wheelchair. Patient returned to room, seated in wheelchair with all needs within reach.   Therapy Documentation Precautions:  Precautions Precautions: Fall Precaution Comments: R BKA, history of right hemiparesis secondary to old CVA Restrictions Weight Bearing Restrictions: Yes RLE Weight Bearing: Non weight bearing Other Position/Activity Restrictions: elevate operated limb, do not prop pillows under knee; try to position on LEFT side so that RIGHT hip can be in neutral/extension   Pain: Pain Assessment Pain Assessment: No/denies pain Pain Score: 0-No pain   Locomotion : Wheelchair Mobility Distance: 100      See FIM for current functional status  Therapy/Group: Individual Therapy  Doralee Albino 08/01/2013, 12:07 PM

## 2013-08-01 NOTE — Progress Notes (Signed)
Social Work Patient ID: Albert [name], male   DOB: 12-29-1960, 52 y.o.   MRN: [id number] Met with pt and wife when here to discuss team conference goals and discharge 10/23.  Scheduled family education from Wed at 1;00-3;00 with her. Discussed DME and follow up needs and she would prefer Home health at first and then transition to OP once ready for amputee clinic.  She is confident she and  Their boy's can provide the care pt requires at home.  Somewhat concerned about the foley and once removed if he will have a voiding issue again.

## 2013-08-02 ENCOUNTER — Inpatient Hospital Stay (HOSPITAL_COMMUNITY): Payer: 59

## 2013-08-02 ENCOUNTER — Inpatient Hospital Stay (HOSPITAL_COMMUNITY): Payer: 59 | Admitting: *Deleted

## 2013-08-02 DIAGNOSIS — I509 Heart failure, unspecified: Secondary | ICD-10-CM

## 2013-08-02 DIAGNOSIS — S88119A Complete traumatic amputation at level between knee and ankle, unspecified lower leg, initial encounter: Secondary | ICD-10-CM

## 2013-08-02 DIAGNOSIS — I739 Peripheral vascular disease, unspecified: Secondary | ICD-10-CM

## 2013-08-02 DIAGNOSIS — N319 Neuromuscular dysfunction of bladder, unspecified: Secondary | ICD-10-CM

## 2013-08-02 DIAGNOSIS — L98499 Non-pressure chronic ulcer of skin of other sites with unspecified severity: Secondary | ICD-10-CM

## 2013-08-02 LAB — GLUCOSE, CAPILLARY
Glucose-Capillary: 84 mg/dL (ref 70–99)
Glucose-Capillary: 109 mg/dL — ABNORMAL HIGH (ref 70–99)
Glucose-Capillary: 123 mg/dL — ABNORMAL HIGH (ref 70–99)

## 2013-08-02 MED ORDER — OXYCODONE HCL 5 MG PO TABS
10.0000 mg | ORAL_TABLET | ORAL | Status: DC | PRN
  Administered 2013-08-02 – 2013-08-07 (×13): 10 mg via ORAL
  Filled 2013-08-02 (×13): qty 2

## 2013-08-02 NOTE — Progress Notes (Signed)
52 year old patient admitted to rehabilitation following a right BKA. Medical problems include hypertension and diabetes as well his peripheral vascular disease. Hospital course complicated by acute kidney injury and NSTEMI. He has a history of coronary artery disease and CABG is being considered post recovery for his triple-vessel disease.  Subjective/Complaints: No SOB no CP during therapy  Objective:  Vital Signs: Blood pressure 128/86, pulse 95, temperature 98 F (36.7 C), temperature source Oral, resp. rate 20, weight 118.253 kg (260 lb 11.2 oz), SpO2 92.00%. No results found. Results for orders placed during the hospital encounter of 07/26/13 (from the past 72 hour(s))  GLUCOSE, CAPILLARY     Status: Abnormal   Collection Time    07/30/13 11:21 AM      Result Value Range   Glucose-Capillary 110 (*) 70 - 99 mg/dL   Comment 1 Notify RN    GLUCOSE, CAPILLARY     Status: Abnormal   Collection Time    07/30/13  4:53 PM      Result Value Range   Glucose-Capillary 139 (*) 70 - 99 mg/dL  GLUCOSE, CAPILLARY     Status: Abnormal   Collection Time    07/30/13  9:11 PM      Result Value Range   Glucose-Capillary 160 (*) 70 - 99 mg/dL  BASIC METABOLIC PANEL     Status: Abnormal   Collection Time    07/31/13  5:00 AM      Result Value Range   Sodium 139  135 - 145 mEq/L   Potassium 3.8  3.5 - 5.1 mEq/L   Chloride 99  96 - 112 mEq/L   CO2 32  19 - 32 mEq/L   Glucose, Bld 146 (*) 70 - 99 mg/dL   BUN 48 (*) 6 - 23 mg/dL   Creatinine, Ser 1.61  0.50 - 1.35 mg/dL   Calcium 8.3 (*) 8.4 - 10.5 mg/dL   GFR calc non Af Amer 63 (*) >90 mL/min   GFR calc Af Amer 73 (*) >90 mL/min   Comment: (NOTE)     The eGFR has been calculated using the CKD EPI equation.     This calculation has not been validated in all clinical situations.     eGFR's persistently <90 mL/min signify possible Chronic Kidney     Disease.  GLUCOSE, CAPILLARY     Status: Abnormal   Collection Time    07/31/13  7:09 AM      Result Value Range   Glucose-Capillary 144 (*) 70 - 99 mg/dL   Comment 1 Notify RN    GLUCOSE, CAPILLARY     Status: Abnormal   Collection Time    07/31/13 11:14 AM      Result Value Range   Glucose-Capillary 132 (*) 70 - 99 mg/dL   Comment 1 Notify RN    GLUCOSE, CAPILLARY     Status: Abnormal   Collection Time    07/31/13  4:40 PM      Result Value Range   Glucose-Capillary 101 (*) 70 - 99 mg/dL  GLUCOSE, CAPILLARY     Status: Abnormal   Collection Time    07/31/13  8:34 PM      Result Value Range   Glucose-Capillary 145 (*) 70 - 99 mg/dL  GLUCOSE, CAPILLARY     Status: Abnormal   Collection Time    08/01/13  7:12 AM      Result Value Range   Glucose-Capillary 105 (*) 70 - 99 mg/dL   Comment 1  Notify RN    CBC     Status: Abnormal   Collection Time    08/01/13  8:45 AM      Result Value Range   WBC 6.1  4.0 - 10.5 K/uL   RBC 3.24 (*) 4.22 - 5.81 MIL/uL   Hemoglobin 8.1 (*) 13.0 - 17.0 g/dL   HCT 16.1 (*) 09.6 - 04.5 %   MCV 83.3  78.0 - 100.0 fL   MCH 25.0 (*) 26.0 - 34.0 pg   MCHC 30.0  30.0 - 36.0 g/dL   RDW 40.9 (*) 81.1 - 91.4 %   Platelets 300  150 - 400 K/uL  GLUCOSE, CAPILLARY     Status: None   Collection Time    08/01/13 11:39 AM      Result Value Range   Glucose-Capillary 88  70 - 99 mg/dL   Comment 1 Notify RN    GLUCOSE, CAPILLARY     Status: Abnormal   Collection Time    08/01/13  4:54 PM      Result Value Range   Glucose-Capillary 154 (*) 70 - 99 mg/dL  GLUCOSE, CAPILLARY     Status: Abnormal   Collection Time    08/01/13  9:04 PM      Result Value Range   Glucose-Capillary 154 (*) 70 - 99 mg/dL   Comment 1 Notify RN    GLUCOSE, CAPILLARY     Status: None   Collection Time    08/02/13  7:18 AM      Result Value Range   Glucose-Capillary 84  70 - 99 mg/dL   Comment 1 Notify RN       HEENT: normal Cardio: RRR and no murmur Resp: CTA B/L and unlabored GI: BS positive and non distended Extremity:  No Edema Skin:   Wound R BK stump  medial eschar loosening, no erythema tried bloody drainage on dressing, left distal foot status post digits 2 through 5 amputation. Small open area at midpoint distal no drainage no odor Neuro: Alert/Oriented, Anxious, Abnormal Motor 3-/5 R delt , bi, tri, grip, 4/5 R HF, 5/5 in L delt bi tri grip,4/5 L HF knee ext and 3- ankle DF and Tone:  Within Normal Limits Musc/Skel:  Extremity tender R stump limb guard Gen NAD   Assessment/Plan: 1. Functional deficits secondary to R BKA and deconditioning which require 3+ hours per day of interdisciplinary therapy in a comprehensive inpatient rehab setting. Physiatrist is providing close team supervision and 24 hour management of active medical problems listed below. Physiatrist and rehab team continue to assess barriers to discharge/monitor patient progress toward functional and medical goals. FIM: FIM - Bathing Bathing Steps Patient Completed: Chest;Right Arm;Left Arm;Abdomen;Left upper leg;Right upper leg;Right lower leg (including foot);Left lower leg (including foot);Front perineal area;Buttocks Bathing: 5: Set-up assist to: Obtain items  FIM - Upper Body Dressing/Undressing Upper body dressing/undressing steps patient completed: Thread/unthread right sleeve of pullover shirt/dresss;Thread/unthread left sleeve of pullover shirt/dress;Put head through opening of pull over shirt/dress;Pull shirt over trunk Upper body dressing/undressing: 5: Set-up assist to: Obtain clothing/put away FIM - Lower Body Dressing/Undressing Lower body dressing/undressing steps patient completed: Thread/unthread right pants leg;Thread/unthread left pants leg;Pull pants up/down Lower body dressing/undressing: 4: Min-Patient completed 75 plus % of tasks  FIM - Toileting Toileting steps completed by patient: Adjust clothing prior to toileting (lateral leans on HD drop arm commode) Toileting: 2: Max-Patient completed 1 of 3 steps (Declined practice pants all the way down then  back up)  FIM - Diplomatic Services operational officer Devices: Bedside commode;Sliding board (HD drop arm commode) Toilet Transfers: 4-To toilet/BSC: Min A (steadying Pt. > 75%);4-From toilet/BSC: Min A (steadying Pt. > 75%)  FIM - Bed/Chair Transfer Bed/Chair Transfer Assistive Devices: Arm rests;Sliding board Bed/Chair Transfer: 5: Chair or W/C > Bed: Supervision (verbal cues/safety issues);5: Bed > Chair or W/C: Supervision (verbal cues/safety issues)  FIM - Locomotion: Wheelchair Distance: 100 Locomotion: Wheelchair: 2: Travels 50 - 149 ft with supervision, cueing or coaxing FIM - Locomotion: Ambulation Ambulation/Gait Assistance: Not tested (comment) Locomotion: Ambulation: 0: Activity did not occur  Comprehension Comprehension Mode: Auditory Comprehension: 6-Follows complex conversation/direction: With extra time/assistive device  Expression Expression Mode: Verbal Expression: 6-Expresses complex ideas: With extra time/assistive device  Social Interaction Social Interaction: 6-Interacts appropriately with others with medication or extra time (anti-anxiety, antidepressant).  Problem Solving Problem Solving: 6-Solves complex problems: With extra time  Memory Memory: 6-More than reasonable amt of time  Medical Problem List and Plan:  1. right BKA due to  Charcot foot/abscess complicated by wound dehiscence after fall, prior left CVA with residual right-sided weakness, cont dressing changes 2. DVT Prophylaxis/Anticoagulation: SCDs left lower extremity  3. Pain Management: Presently Tylenol only. Will monitor with increased therapy  4. Mood: Xanax 0.25 mg twice a day as needed. Provide emotional support  5. Neuropsych: This patient is capable of making decisions on his own behalf.  6. Recent multi-toe amputation left foot February 2014. dressing changes followed by wound care nurse. Improving 7. ID. Sepsis/MSSA bacteremia. Ancef completed 8. CAD/NSTEMIx2. Continue  aspirin therapy. Followup cardiothoracic surgery to consider CABG in the near future  9. Diastolic congestive heart failure. resume intravenous Lasix therapy as directed for now. Cardiology services will continue to follow and adjust Lasix as needed. Check daily weights with I's and O's.  10. Diabetes mellitus peripheral neuropathy. Hemoglobin A1c 8.2 Levemir 23 units each bedtime. Check blood sugars a.c. and at bedtime  11. Hypertension. Hydralazine 25 mg every 6 hours, Coreg 25 mg twice a day. Monitor with increased mobility  12. Acute renal failure. Renal function improved with latest creatinine 1.30. Followup chemistries  13. Acute blood loss anemia. Latest hemoglobin 8.1 improving    LOS (Days) 7 A FACE TO FACE EVALUATION WAS PERFORMED  KIRSTEINS,ANDREW E 08/02/2013, 8:36 AM

## 2013-08-02 NOTE — Progress Notes (Signed)
Occupational Therapy Session Note  Patient Details  Name: Albert [name] MRN: [id number] Date of Birth: [birth year]  Today's Date: 08/02/2013 Time: 4098-1191 Time calculation (min): 50 min   Short Term Goals: Week 2:   Focus on LTGs  Skilled Therapeutic Interventions/Progress Updates:    Therapy session focused on ADL retraining, functional transfers, and activity tolerance. Pt received supine in bed and requesting to eat breakfast before completing therapy session. Pt sat EOB to eat breakfast. Completed bathing while sitting EOB using LH sponge then completed in supine via rolling for buttocks and peri hygiene. Completed LB dressing using lateral lean technique with increased time. Required assist to don L sock as pt reported unable to bend forward d/t pain with catheter. Completed sliding board transfer bed>w/c with supervision. Pt placed sliding board correctly without assist however required assist to remove. Completed oral care at sink at Mod I level. Pt required less rest breaks during therapy session on this date. Pt left sitting in w/c with stump protector on.   Therapy Documentation Precautions:  Precautions Precautions: Fall Precaution Comments: R BKA, history of right hemiparesis secondary to old CVA Restrictions Weight Bearing Restrictions: Yes RLE Weight Bearing: Non weight bearing Other Position/Activity Restrictions: elevate operated limb, do not prop pillows under knee; try to position on LEFT side so that RIGHT hip can be in neutral/extension General:   Vital Signs:   Pain:  Pt began c/o pain in lumbar region at end of therapy session.   See FIM for current functional status  Therapy/Group: Individual Therapy  Daneil Dan 08/02/2013, 11:41 AM

## 2013-08-02 NOTE — Progress Notes (Signed)
Occupational Therapy Note  Patient Details  Name: Albert [name] MRN: [id number] Date of Birth: 04-11-1961 Today's Date: 08/02/2013  Time: 1030-1100 Pt denies pain although patient stated that his Foley catheter was irritating; RN aware Individual Therapy  Pt seated in w/c upon arrival.  Pt engaged in Rt hand tasks with focus on finger flexion/extension, wrist flexion/extension, and supination/pronation.  Pt stated that after he went back to work after his stroke in 2011 he stopped "working" with his Rt hand and adapted.  Pt exhibits finger flexion and partial extension as well as slight wrist flexion and extension.  Pt's limb guard fell off X 3 and needed readjusting.   Lavone Neri [facility] 08/02/2013, 11:21 AM

## 2013-08-02 NOTE — Progress Notes (Signed)
Rt. BKA dressing changed x1 today, large amt. bloody drng....4-4x4's, kerlix wrap saturated this AM. ( see 10/16 PN)  Incision line with large dark area, soft tissue with odor present; 7cm's x 5cm's. Peri-wound area red, edematous,  Harvel Ricks PAC aware. Rest of incision line with scattered darkened areas, scabbing, moderate amt bloody drng midline.  Staples that remain are intact.  Left foot with open area next to great toe ; yellow tissue, no drng. 1cm x 0.25cm; OTA per WOC RN orders 10/16. Pt c/o increased pain at back and rt leg.  Oxy 10 mg effective; taking 2-3 x daily, K-pad to back.

## 2013-08-02 NOTE — Progress Notes (Signed)
Subjective:  Feels well. No chest pain. No dyspnea. Right BKA stump had bloody drainage yesterday.  Objective:  Vital Signs in the last 24 hours: Temp:  [98 F (36.7 C)-98.2 F (36.8 C)] 98 F (36.7 C) (10/17 0642) Pulse Rate:  [86-95] 95 (10/17 0644) Resp:  [18-20] 20 (10/17 0644) BP: (109-128)/(74-88) 128/86 mmHg (10/17 0644) SpO2:  [92 %-95 %] 92 % (10/17 0644)  Intake/Output from previous day: 10/16 0701 - 10/17 0700 In: 840 [P.O.:840] Out: 2650 [Urine:2650] Intake/Output from this shift:    . aspirin  81 mg Oral Daily  . atorvastatin  40 mg Oral q1800  . carvedilol  25 mg Oral BID WC  . famotidine  20 mg Oral BID  . furosemide  60 mg Intravenous BID  . hydrALAZINE  10 mg Oral Q6H  . insulin aspart  0-15 Units Subcutaneous TID WC  . insulin detemir  23 Units Subcutaneous QHS  . polyethylene glycol  17 g Oral Daily  . senna-docusate  1 tablet Oral BID      Physical Exam: The patient appears to be in no distress.  Head and neck exam reveals that the pupils are equal and reactive.  The extraocular movements are full.  There is no scleral icterus.  Mouth and pharynx are benign.  No lymphadenopathy.  No carotid bruits.  The jugular venous pressure is normal.  Thyroid is not enlarged or tender.  Chest reveals no rales or wheezes. Poor breath sounds both bases. Heart reveals no abnormal lift or heave.  First and second heart sounds are normal.  There is no murmur gallop rub or click.  The abdomen is soft and nontender.  Bowel sounds are normoactive.  There is no hepatosplenomegaly or mass.  There are no abdominal bruits.  Extremities reveal R BKA. 2+ edema left leg.  Neurologic exam is normal strength and no lateralizing weakness.  No sensory deficits.  Integument reveals no rash  Lab Results:  Recent Labs  08/01/13 0845  WBC 6.1  HGB 8.1*  PLT 300    Recent Labs  07/31/13 0500  NA 139  K 3.8  CL 99  CO2 32  GLUCOSE 146*  BUN 48*  CREATININE  1.30   No results found for this basename: TROPONINI, CK, MB,  in the last 72 hours Hepatic Function Panel No results found for this basename: PROT, ALBUMIN, AST, ALT, ALKPHOS, BILITOT, BILIDIR, IBILI,  in the last 72 hours No results found for this basename: CHOL,  in the last 72 hours No results found for this basename: PROTIME,  in the last 72 hours  Imaging: Imaging results have been reviewed  Cardiac Studies: None. Assessment/Plan:  1. Recent sepsis/MSSA bacteremia: (adm 9/9->9/17->CIR) from gangrenous R foot s/p R BKA 06/29/13, TEE negative for vegetation 07/03/13  2. CAD: 3-vessel CAD by cath 06/28/13. He will need CABG when he recovers from present illness. Continue beta blocker, ASA and statin.  3. Ischemic cardiomyopathy with acute systolic CHF: LVEF 40-45% by echo on 07/15/13. Moderate mitral regurgitation by TEE 07/03/13 . Pt remains volume overloaded. Good diuresis last 24 hours with IV Lasix. Continue Lasix 60 IV BID and follow renal function. No ACEI given renal insufficiency. Continue hydralazine for afterload reduction.  4. Acute hypoxic respiratory failure: Improved  5. Acute renal failure Resolved. Follow renal function closely with diuresis. Renal function continues to improve. 6. Anemia: Follow.  Plan: Will get chest xray today to check on basilar aeration.  Check renal function  in am.  LOS: 7 days    Cassell Clement 08/02/2013, 7:57 AM

## 2013-08-02 NOTE — Plan of Care (Signed)
Problem: RH Bed to Chair Transfers Goal: LTG Patient will perform bed/chair transfers w/assist (PT) LTG: Patient will perform bed/chair transfers with assistance, with/without cues (PT).  Downgraded goal 07/31/13  Problem: RH Car Transfers Goal: LTG Patient will perform car transfers with assist (PT) LTG: Patient will perform car transfers with assistance (PT).  Downgraded goal 07/31/13  Problem: RH Wheelchair Mobility Goal: LTG Patient will propel w/c in controlled environment (PT) LTG: Patient will propel wheelchair in controlled environment, # of feet with assist (PT)  Downgraded goal 07/31/13 Goal: LTG Patient will propel w/c in home environment (PT) LTG: Patient will propel wheelchair in home environment, # of feet with assistance (PT).  Downgraded goal 07/31/13

## 2013-08-02 NOTE — Progress Notes (Signed)
0830.Marland KitchenMarland KitchenCalled to room by  OT this am, pt's rt BKA dressing saturated with bloody drng. ( 4 - 4x4's, kerlix wrap and ace bandage.)  Redressed; 1000....large amt bloody drng through dressing, reinforced.  Harvel Ricks PAC aware. 1800: No further drainage; dressings C.D.I.

## 2013-08-02 NOTE — Progress Notes (Signed)
I have reviewed and agree with the treatment as reflected in this note. Varick Keys, PT DPT  

## 2013-08-02 NOTE — Plan of Care (Signed)
Problem: RH SKIN INTEGRITY Goal: RH STG ABLE TO PERFORM INCISION/WOUND CARE W/ASSISTANCE STG Able To Perform Incision/Wound Care With Mod Assistance.  Outcome: Progressing Wife in for ED. 10/22

## 2013-08-02 NOTE — Progress Notes (Signed)
Physical Therapy Session Note  Patient Details  Name: Albert [name] MRN: [id number] Date of Birth: 11-24-1960  Today's Date: 08/02/2013 Time: 0930-1030 and 1330-1415 Time Calculation (min): 60 min and  Short Term Goals: Week 1:  PT Short Term Goal 1 (Week 1): Pt will roll L with side rail, head of bed flat and S.  PT Short Term Goal 2 (Week 1): Pt will roll R with side rail, head of bed flat and min A.  PT Short Term Goal 3 (Week 1): Pt will transfer supine to edge of bed, edge of bed to supine with min A.  PT Short Term Goal 4 (Week 1): Pt will transfer bed to chair, chair to bed with or without sliding board and min A.  PT Short Term Goal 5 (Week 1): Pt will propel w/c about 50 feet with L UE and LE and S.   Skilled Therapeutic Interventions/Progress Updates:   AM Session: Patient received seated in wheelchair, Darco shoe donned LLE, limb guard donned R LE. Session focused on wheelchair mobility and functional transfers. Patient signed off unit and transported to hospital lobby for wheelchair mobility. Patient instructed in wheelchair mobility 300' supervision level with B UE and L LE in community environment (indoor and outdoor), patient required to navigate multiple surfaces and thresholds (carpet, concrete, tile, inclines, declines) and be aware of any obstacles or people. Verbal cues for safety and use of L LE to help control wheelchair when on an incline or hill. Patient instructed wheelchair mobility in home environment 30' supervision level.  Patient able to propel wheelchair and appropriately position wheelchair for sliding board transfer to bed. Wheelchair>bed (R side) slide board transfer supervision level, patient attempted to place board independently, but required modA for proper positioning of board, and required assistance with removal of board. Patient sit<>supine in bed HOB flat no bedrails supervision level. In supine instructed in 1x10reps R LE adduction, for  strengthening to assist with keeping leg positioned on amp pad of wheelchair. Bed>wheelchair slide board transfer (L side) supervision level with assistance for placement/removal of board. Patient with c/o of wheelchair cushion being too "hard", switched from basic to roho, patient reports increased comfort. Patient reports ramp into home may not be installed by the time he is discharged, discussion with patient regarding family education to bump him up the one step into the home in the wheelchair, patient states wife will be present at family education, but his son will not. Patient returned to room, seated in wheelchair with all needs within reach.   PM Session: Patient received supine in bed. Session focused on bed mobility, functional transfers, and B UE/LE strengthening. Supine>sit HOB flat no bedrails supervision level, seated EOB darco shoe donned L LE and limbguard donned R LE. Bed>wheelchair (L side) sliding board transfer supervision level, assistance for placement/removal of board. Patient totalA wheelchair mobility room>gym. Patient instructed in wheelchair push-ups 4x5reps, for B UE strengthening, verbal cues for slow eccentric control of triceps for lowering bottom into chair, and increased use of L LE to assist lifting bottom from chair. Patient sliding board transfer wheelchair>level mat table (L side) supervision level, assistance for placement removal of board, and dycem placed under L LE to prevent slipping. Patient instructed in seated B LAQs and hip flexion with 1.5# on L LE each 2x15reps for LE strengthening. Patient sit>supine supervision level, completed 1x15reps B SLR (1.5# L LE) for LE strengthening. Returned supine>sit supervision level. Level mat table>wheelchair (R side) slide board transfer supervision,  assistance for placement/removal board. Patient returned to room seated in wheelchair, nurse tech present for patient to transfer into bed, all needs within reach.   Therapy  Documentation Precautions:  Precautions Precautions: Fall Precaution Comments: R BKA, history of right hemiparesis secondary to old CVA Restrictions Weight Bearing Restrictions: Yes RLE Weight Bearing: Non weight bearing Other Position/Activity Restrictions: elevate operated limb, do not prop pillows under knee; try to position on LEFT side so that RIGHT hip can be in neutral/extension   Pain: Pain Assessment Pain Assessment: 0-10 Pain Score: 5  Pain Location: Back Pain Orientation: Lower    Locomotion : Wheelchair Mobility Wheelchair Mobility: Yes Wheelchair Assistance: 5: Supervision Wheelchair Assistance Details: Verbal cues for precautions/safety;Verbal cues for safe use of DME/AE Wheelchair Propulsion: Both upper extremities;Left lower extremity Wheelchair Parts Management: Needs assistance Distance: 300'   See FIM for current functional status  Therapy/Group: Individual Therapy  Doralee Albino 08/02/2013, 12:18 PM

## 2013-08-03 ENCOUNTER — Inpatient Hospital Stay (HOSPITAL_COMMUNITY): Payer: 59 | Admitting: Physical Therapy

## 2013-08-03 DIAGNOSIS — I509 Heart failure, unspecified: Secondary | ICD-10-CM

## 2013-08-03 DIAGNOSIS — I5021 Acute systolic (congestive) heart failure: Secondary | ICD-10-CM

## 2013-08-03 LAB — GLUCOSE, CAPILLARY
Glucose-Capillary: 200 mg/dL — ABNORMAL HIGH (ref 70–99)
Glucose-Capillary: 224 mg/dL — ABNORMAL HIGH (ref 70–99)
Glucose-Capillary: 183 mg/dL — ABNORMAL HIGH (ref 70–99)

## 2013-08-03 LAB — BASIC METABOLIC PANEL
BUN: 40 mg/dL — ABNORMAL HIGH (ref 6–23)
Calcium: 8.1 mg/dL — ABNORMAL LOW (ref 8.4–10.5)
Chloride: 101 mEq/L (ref 96–112)
GFR calc Af Amer: 64 mL/min — ABNORMAL LOW (ref >90)
GFR calc non Af Amer: 55 mL/min — ABNORMAL LOW (ref >90)
Glucose, Bld: 211 mg/dL — ABNORMAL HIGH (ref 70–99)
Sodium: 142 mEq/L (ref 135–145)

## 2013-08-03 LAB — CBC
MCH: 25 pg — ABNORMAL LOW (ref 26.0–34.0)
MCHC: 30.3 g/dL (ref 30.0–36.0)
MCV: 82.5 fL (ref 78.0–100.0)
Platelets: 263 10*3/uL (ref 150–400)

## 2013-08-03 NOTE — Progress Notes (Signed)
52 year old patient admitted to rehabilitation following a right BKA. Medical problems include hypertension and diabetes as well his peripheral vascular disease. Hospital course complicated by acute kidney injury and NSTEMI. He has a history of coronary artery disease and CABG is being considered post recovery for his triple-vessel disease.  Subjective/Complaints: Denies pain- feels well. Encouraged by progress with therapy  Objective:  Vital Signs: Blood pressure 139/93, pulse 97, temperature 98.1 F (36.7 C), temperature source Oral, resp. rate 18, weight 264 lb (119.75 kg), SpO2 92.00%.   well-developed well-nourished male in no acute distress. HEENT exam atraumatic, normocephalic, neck supple without jugular venous distention. Chest clear to auscultation cardiac exam S1-S2 are regular. Abdominal exam with bowel sounds, soft and nontender. Extremities- s/p RBKA, LLE with 2+ edema to midcalf  CBG (last 3)   Recent Labs  08/02/13 1635 08/02/13 2020 08/03/13 0057  GLUCAP 123* 161* 230*      Assessment/Plan: 1. Functional deficits secondary to R BKA and deconditioning  Medical Problem List and Plan:  1. right BKA due to  Charcot foot/abscess complicated by wound dehiscence after fall, prior left CVA with residual right-sided weakness, cont dressing changes 2. DVT Prophylaxis/Anticoagulation: SCDs left lower extremity  3. Pain Management: patient has no significant pain 4. Mood: Xanax 0.25 mg twice a day as needed. Provide emotional support  5. Neuropsych: This patient is capable of making decisions on his own behalf.  6. Recent multi-toe amputation left foot February 2014. dressing changes followed by wound care nurse. Improving 7. ID. Sepsis/MSSA bacteremia. Ancef completed 8. CAD/NSTEMIx2. Continue aspirin therapy. Followup cardiothoracic surgery to consider CABG in the near future  9. Diastolic congestive heart failure. resume intravenous Lasix therapy as directed for now.  Cardiology services will continue to follow and adjust Lasix as needed. Check daily weights with I's and O's.  10. Diabetes mellitus peripheral neuropathy. Hemoglobin A1c 8.2 Levemir 23 units each bedtime. Check blood sugars a.c. and at bedtime . Fair control currently 11. Hypertension. Hydralazine 25 mg every 6 hours, Coreg 25 mg twice a day. Monitor with increased mobility  BP Readings from Last 3 Encounters:  08/03/13 139/93  07/26/13 93/60  07/14/13 111/71    12. Acute renal failure. Renal function improved with latest  Lab Results  Component Value Date   CREATININE 1.30 07/31/2013   13. Acute blood loss anemia. Latest hemoglobin 8.1 improving    LOS (Days) 8 A FACE TO FACE EVALUATION WAS PERFORMED  Lashawnta Burgert HENRY 08/03/2013, 6:44 AM

## 2013-08-03 NOTE — Progress Notes (Signed)
Physical Therapy Note  Patient Details  Name: Albert [name] MRN: [id number] Date of Birth: 1961-07-23 Today's Date: 08/03/2013  1030- 1110 (40 minutes) individual Pain : 3/10 buttocks, RT BKA Focus of treatment : wc mobility/ activity tolerance; transfer training; RT BKA AROM/ strengthening exercises. Treatment: Pt up in wc upon arrival; wc mobility - 60 feet X 2 SBA with increased time (distance limited by fatigue) ; wc setup for transfer with vcs for location of legrest release; SBA with sliding board transfer ; sit to supine SBA (mat) ; Therapeutic exercises- RT BKA exercises X 20- hip flexion/extension, hip abduction.    Albert [name],Albert [name] 08/03/2013, 10:57 AM

## 2013-08-03 NOTE — Progress Notes (Signed)
Patient ID: Albert [name], male   DOB: [birth year], 52 y.o.   MRN: [id number]   SUBJECTIVE:   Patient is comfortable in the wheelchair this morning. He still has edema in his left leg. He is diuresing.   Filed Vitals:   08/02/13 2342 08/03/13 0511 08/03/13 0614 08/03/13 0629  BP: 124/73 142/108 135/99 139/93  Pulse: 95 93 95 97  Temp:  98.1 F (36.7 C)    TempSrc:  Oral    Resp:  18    Weight:  264 lb (119.75 kg)    SpO2:  92%      Intake/Output Summary (Last 24 hours) at 08/03/13 1026 Last data filed at 08/03/13 0512  Gross per 24 hour  Intake    480 ml  Output   2250 ml  Net  -1770 ml    LABS: Basic Metabolic Panel:  Recent Labs  40/98/11 0600  NA 142  K 4.8  CL 101  CO2 30  GLUCOSE 211*  BUN 40*  CREATININE 1.44*  CALCIUM 8.1*   Liver Function Tests: No results found for this basename: AST, ALT, ALKPHOS, BILITOT, PROT, ALBUMIN,  in the last 72 hours No results found for this basename: LIPASE, AMYLASE,  in the last 72 hours CBC:  Recent Labs  08/01/13 0845 08/03/13 0600  WBC 6.1 4.2  HGB 8.1* 8.0*  HCT 27.0* 26.4*  MCV 83.3 82.5  PLT 300 263   Cardiac Enzymes: No results found for this basename: CKTOTAL, CKMB, CKMBINDEX, TROPONINI,  in the last 72 hours BNP: No components found with this basename: POCBNP,  D-Dimer: No results found for this basename: DDIMER,  in the last 72 hours Hemoglobin A1C: No results found for this basename: HGBA1C,  in the last 72 hours Fasting Lipid Panel: No results found for this basename: CHOL, HDL, LDLCALC, TRIG, CHOLHDL, LDLDIRECT,  in the last 72 hours Thyroid Function Tests: No results found for this basename: TSH, T4TOTAL, FREET3, T3FREE, THYROIDAB,  in the last 72 hours  RADIOLOGY: Dg Chest 2 View  08/02/2013   CLINICAL DATA:  CHF and atelectasis.  EXAM: CHEST  2 VIEW  COMPARISON:  Chest radiograph 07/21/2013.  FINDINGS: Markedly limited examination due to technique. Interval removal of right internal jugular  central venous catheter sheath. Left upper extremity PICC line tip projects at the right superior cavoatrial junction. Stable enlarged cardiac and mediastinal contours. Interval improvement in bilateral interstitial pulmonary opacities. Small bilateral pleural effusions.  IMPRESSION: 1. Interval improvement in bilateral interstitial pulmonary opacities. 2. Small bilateral pleural effusions.   Electronically Signed   By: Annia Belt M.D.   On: 08/02/2013 13:43   Dg Chest 2 View  07/12/2013   CLINICAL DATA:  Airspace opacities in the lungs and pleural effusions.  EXAM: CHEST  2 VIEW  COMPARISON:  07/11/2013  FINDINGS: Left central line tip projects over the lower SVC.  Mildly increased airspace opacity in the right middle lobe. Otherwise the bilateral perihilar airspace opacities appear stable.  Cardiothoracic index 57%. Moderate pleural effusions on the lateral projection.  IMPRESSION: 1. Moderate bilateral pleural effusions. Perihilar airspace opacities with mild cardiomegaly favoring pulmonary edema over atypical pneumonia.   Electronically Signed   By: Herbie Baltimore   On: 07/12/2013 07:43   Dg Chest 2 View  07/11/2013   CLINICAL DATA:  Shortness breath. Cough. Diabetic and hypertensive patient.  EXAM: CHEST  2 VIEW  COMPARISON:  06/27/2013.  FINDINGS: Left PICC line tip distal superior vena cava level just above  the cavoatrial junction.  Progressive diffuse asymmetric airspace disease with bilateral pleural effusions. Although pulmonary edema may explain this appearance, the patchy consolidation raises possibility of superimposed infection. Clinical correlation recommended. Followup until clearance recommended.  No gross pneumothorax.  Cardiomegaly.  IMPRESSION: Progressive diffuse asymmetric airspace disease with bilateral pleural effusions. Although pulmonary edema may explain this appearance, the patchy consolidation raises possibility of superimposed infection. Clinical correlation recommended.   Please see above.  These results will be called to the ordering clinician or representative by the Radiologist Assistant, and communication documented in the PACS Dashboard.   Electronically Signed   By: Bridgett Larsson   On: 07/11/2013 08:58   Dg Chest Port 1 View  07/21/2013   CLINICAL DATA:  Edema and short of breath  EXAM: PORTABLE CHEST - 1 VIEW  COMPARISON:  Chest radiograph 07/17/2013  FINDINGS: Right central venous line is unchanged. Left PICC line is unchanged. Stable enlarged cardiac silhouette. There is bibasilar atelectasis similar to prior. There is some improvement in perihilar airspace disease seen on prior. No pneumothorax.  IMPRESSION: 1. Some mild improvement perihilar airspace disease.  2.  Persistent dense bibasilar atelectasis.   Electronically Signed   By: Genevive Bi M.D.   On: 07/21/2013 07:40   Dg Chest Port 1 View  07/17/2013   *RADIOLOGY REPORT*  Clinical Data: Respiratory distress, recent right-sided below-knee amputation, subsequent encounter.  PORTABLE CHEST - 1 VIEW  Comparison: 07/15/2013; 07/14/2013; 07/12/2013  Findings:  The examination is degraded due to positioning and patient body habitus.  Grossly unchanged cardiac silhouette and mediastinal contours. Stable positioning of support apparatus. No pneumothorax.  The pulmonary vasculature remains indistinct with cephalization of flow. Grossly unchanged small bilateral pleural effusions, right greater than left with associated bilateral mid and lower lung heterogeneous / consolidative opacities.  Unchanged bones.  IMPRESSION: Degraded examination with persistent findings of suspected alveolar pulmonary edema, small bilateral effusions and associated bibasilar opacities, atelectasis versus infiltrate.   Original Report Authenticated By: Tacey Ruiz, MD   Dg Chest Port 1 View  07/15/2013   CLINICAL DATA:  Pulmonary edema.  IJ catheter insertion.  EXAM: PORTABLE CHEST - 1 VIEW  COMPARISON:  Chest x-ray dated 07/14/2013   FINDINGS: Right IJ catheter has been inserted and the tip is at the superior vena cava adjacent to the azygos vein. No pneumothorax. Improved bilateral pulmonary edema and pleural effusions. Heart size and vascularity are now normal.  IMPRESSION: IJ catheter tip at the level of the azygos vein in the superior vena cava. Improving pulmonary edema and effusions.   Electronically Signed   By: Geanie Cooley   On: 07/15/2013 17:53   Dg Chest Port 1 View  07/14/2013   CLINICAL DATA:  Hypoxemia, low blood pressure, respiratory distress.  EXAM: PORTABLE CHEST - 1 VIEW  COMPARISON:  07/12/2013  FINDINGS: Diffuse perihilar and scratch head diffuse bilateral airspace opacities compatible with edema. Heart is borderline in size. Suspect layering effusions. No acute bony abnormality.  IMPRESSION: Moderate pulmonary edema and layering effusions.   Electronically Signed   By: Charlett Nose M.D.   On: 07/14/2013 22:34    PHYSICAL EXAM    Patient is in a wheelchair. He stable. Lungs reveal scattered rhonchi. Cardiac exam reveals S1 and S2. There is still 1-2+ peripheral edema in the left lower extremity.    ASSESSMENT AND PLAN:    Coronary atherosclerosis of native coronary artery   Cardiomyopathy, ischemic    Acute systolic CHF (congestive heart failure)  Patient his diuresis. He has continued volume overload. Plan to continue his IV diuretics and continue to watch his renal function.  Willa Rough 08/03/2013 10:26 AM

## 2013-08-04 ENCOUNTER — Inpatient Hospital Stay (HOSPITAL_COMMUNITY): Payer: 59 | Admitting: Occupational Therapy

## 2013-08-04 ENCOUNTER — Inpatient Hospital Stay (HOSPITAL_COMMUNITY): Payer: 59 | Admitting: Physical Therapy

## 2013-08-04 LAB — GLUCOSE, CAPILLARY
Glucose-Capillary: 127 mg/dL — ABNORMAL HIGH (ref 70–99)
Glucose-Capillary: 119 mg/dL — ABNORMAL HIGH (ref 70–99)
Glucose-Capillary: 217 mg/dL — ABNORMAL HIGH (ref 70–99)
Glucose-Capillary: 251 mg/dL — ABNORMAL HIGH (ref 70–99)

## 2013-08-04 NOTE — Progress Notes (Signed)
52 year old patient admitted to rehabilitation following a right BKA. Medical problems include hypertension and diabetes as well his peripheral vascular disease. Hospital course complicated by acute kidney injury and NSTEMI. He has a history of coronary artery disease and CABG is being considered post recovery for his triple-vessel disease.  Subjective/Complaints: Denies pain- feels well. Encouraged by progress with therapy  Objective:  Vital Signs: Blood pressure 128/94, pulse 85, temperature 97.6 F (36.4 C), temperature source Oral, resp. rate 20, weight 261 lb 8 oz (118.616 kg), SpO2 99.00%.   well-developed well-nourished male in no acute distress. HEENT exam atraumatic, normocephalic, neck supple without jugular venous distention. Chest clear to auscultation cardiac exam S1-S2 are regular. Abdominal exam with bowel sounds, soft and nontender. Extremities- s/p RBKA, LLE with 2+ edema to midcalf  CBG (last 3)   Recent Labs  08/03/13 1645 08/03/13 2111 08/04/13 0720  GLUCAP 224* 183* 127*      Assessment/Plan: 1. Functional deficits secondary to R BKA and deconditioning  Medical Problem List and Plan:  1. right BKA due to  Charcot foot/abscess complicated by wound dehiscence after fall, prior left CVA with residual right-sided weakness, cont dressing changes 2. DVT Prophylaxis/Anticoagulation: SCDs left lower extremity  3. Pain Management: patient has no significant pain 4. Mood: Xanax 0.25 mg twice a day as needed. Provide emotional support  5. Neuropsych: This patient is capable of making decisions on his own behalf.  6. Recent multi-toe amputation left foot February 2014. dressing changes followed by wound care nurse. Improving 7. ID. Sepsis/MSSA bacteremia. Ancef completed 8. CAD/NSTEMIx2. Continue aspirin therapy. Followup cardiothoracic surgery to consider CABG in the near future  9. Diastolic congestive heart failure. resume intravenous Lasix therapy as directed for now.  Cardiology services will continue to follow and adjust Lasix as needed. Check daily weights with I's and O's.  10. Diabetes mellitus peripheral neuropathy. Hemoglobin A1c 8.2 Levemir 23 units each bedtime. Check blood sugars a.c. and at bedtime . Fair control currently 11. Hypertension. Hydralazine 25 mg every 6 hours, Coreg 25 mg twice a day. Monitor with increased mobility  BP Readings from Last 3 Encounters:  08/04/13 128/94  07/26/13 93/60  07/14/13 111/71    12. Acute renal failure. Renal function improved with latest  Lab Results  Component Value Date   CREATININE 1.44* 08/03/2013   13. Acute blood loss anemia. Latest hemoglobin  Lab Results  Component Value Date   HGB 8.0* 08/03/2013      LOS (Days) 9 A FACE TO FACE EVALUATION WAS PERFORMED  Karmine Kauer HENRY 08/04/2013, 8:47 AM

## 2013-08-04 NOTE — Progress Notes (Signed)
Patient ID: Albert [name], male   DOB: 07/06/1961, 52 y.o.   MRN: [id number]    SUBJECTIVE:  Patient is stable today flat in bed.   Filed Vitals:   08/03/13 1600 08/03/13 2300 08/04/13 0500 08/04/13 0650  BP: 131/92 136/84 128/94 128/94  Pulse: 98  85   Temp: 98.1 F (36.7 C)  97.6 F (36.4 C)   TempSrc: Oral     Resp: 19  20   Weight:   261 lb 8 oz (118.616 kg)   SpO2: 97%  99%      Intake/Output Summary (Last 24 hours) at 08/04/13 1129 Last data filed at 08/04/13 0500  Gross per 24 hour  Intake    720 ml  Output   2150 ml  Net  -1430 ml    LABS: Basic Metabolic Panel:  Recent Labs  57/84/69 0600  NA 142  K 4.8  CL 101  CO2 30  GLUCOSE 211*  BUN 40*  CREATININE 1.44*  CALCIUM 8.1*   Liver Function Tests: No results found for this basename: AST, ALT, ALKPHOS, BILITOT, PROT, ALBUMIN,  in the last 72 hours No results found for this basename: LIPASE, AMYLASE,  in the last 72 hours CBC:  Recent Labs  08/03/13 0600  WBC 4.2  HGB 8.0*  HCT 26.4*  MCV 82.5  PLT 263   Cardiac Enzymes: No results found for this basename: CKTOTAL, CKMB, CKMBINDEX, TROPONINI,  in the last 72 hours BNP: No components found with this basename: POCBNP,  D-Dimer: No results found for this basename: DDIMER,  in the last 72 hours Hemoglobin A1C: No results found for this basename: HGBA1C,  in the last 72 hours Fasting Lipid Panel: No results found for this basename: CHOL, HDL, LDLCALC, TRIG, CHOLHDL, LDLDIRECT,  in the last 72 hours Thyroid Function Tests: No results found for this basename: TSH, T4TOTAL, FREET3, T3FREE, THYROIDAB,  in the last 72 hours  RADIOLOGY: Dg Chest 2 View  08/02/2013   CLINICAL DATA:  CHF and atelectasis.  EXAM: CHEST  2 VIEW  COMPARISON:  Chest radiograph 07/21/2013.  FINDINGS: Markedly limited examination due to technique. Interval removal of right internal jugular central venous catheter sheath. Left upper extremity PICC line tip projects at the  right superior cavoatrial junction. Stable enlarged cardiac and mediastinal contours. Interval improvement in bilateral interstitial pulmonary opacities. Small bilateral pleural effusions.  IMPRESSION: 1. Interval improvement in bilateral interstitial pulmonary opacities. 2. Small bilateral pleural effusions.   Electronically Signed   By: Annia Belt M.D.   On: 08/02/2013 13:43   Dg Chest 2 View  07/12/2013   CLINICAL DATA:  Airspace opacities in the lungs and pleural effusions.  EXAM: CHEST  2 VIEW  COMPARISON:  07/11/2013  FINDINGS: Left central line tip projects over the lower SVC.  Mildly increased airspace opacity in the right middle lobe. Otherwise the bilateral perihilar airspace opacities appear stable.  Cardiothoracic index 57%. Moderate pleural effusions on the lateral projection.  IMPRESSION: 1. Moderate bilateral pleural effusions. Perihilar airspace opacities with mild cardiomegaly favoring pulmonary edema over atypical pneumonia.   Electronically Signed   By: Herbie Baltimore   On: 07/12/2013 07:43   Dg Chest 2 View  07/11/2013   CLINICAL DATA:  Shortness breath. Cough. Diabetic and hypertensive patient.  EXAM: CHEST  2 VIEW  COMPARISON:  06/27/2013.  FINDINGS: Left PICC line tip distal superior vena cava level just above the cavoatrial junction.  Progressive diffuse asymmetric airspace disease with bilateral pleural effusions.  Although pulmonary edema may explain this appearance, the patchy consolidation raises possibility of superimposed infection. Clinical correlation recommended. Followup until clearance recommended.  No gross pneumothorax.  Cardiomegaly.  IMPRESSION: Progressive diffuse asymmetric airspace disease with bilateral pleural effusions. Although pulmonary edema may explain this appearance, the patchy consolidation raises possibility of superimposed infection. Clinical correlation recommended.  Please see above.  These results will be called to the ordering clinician or  representative by the Radiologist Assistant, and communication documented in the PACS Dashboard.   Electronically Signed   By: Bridgett Larsson   On: 07/11/2013 08:58   Dg Chest Port 1 View  07/21/2013   CLINICAL DATA:  Edema and short of breath  EXAM: PORTABLE CHEST - 1 VIEW  COMPARISON:  Chest radiograph 07/17/2013  FINDINGS: Right central venous line is unchanged. Left PICC line is unchanged. Stable enlarged cardiac silhouette. There is bibasilar atelectasis similar to prior. There is some improvement in perihilar airspace disease seen on prior. No pneumothorax.  IMPRESSION: 1. Some mild improvement perihilar airspace disease.  2.  Persistent dense bibasilar atelectasis.   Electronically Signed   By: Genevive Bi M.D.   On: 07/21/2013 07:40   Dg Chest Port 1 View  07/17/2013   *RADIOLOGY REPORT*  Clinical Data: Respiratory distress, recent right-sided below-knee amputation, subsequent encounter.  PORTABLE CHEST - 1 VIEW  Comparison: 07/15/2013; 07/14/2013; 07/12/2013  Findings:  The examination is degraded due to positioning and patient body habitus.  Grossly unchanged cardiac silhouette and mediastinal contours. Stable positioning of support apparatus. No pneumothorax.  The pulmonary vasculature remains indistinct with cephalization of flow. Grossly unchanged small bilateral pleural effusions, right greater than left with associated bilateral mid and lower lung heterogeneous / consolidative opacities.  Unchanged bones.  IMPRESSION: Degraded examination with persistent findings of suspected alveolar pulmonary edema, small bilateral effusions and associated bibasilar opacities, atelectasis versus infiltrate.   Original Report Authenticated By: Tacey Ruiz, MD   Dg Chest Port 1 View  07/15/2013   CLINICAL DATA:  Pulmonary edema.  IJ catheter insertion.  EXAM: PORTABLE CHEST - 1 VIEW  COMPARISON:  Chest x-ray dated 07/14/2013  FINDINGS: Right IJ catheter has been inserted and the tip is at the superior  vena cava adjacent to the azygos vein. No pneumothorax. Improved bilateral pulmonary edema and pleural effusions. Heart size and vascularity are now normal.  IMPRESSION: IJ catheter tip at the level of the azygos vein in the superior vena cava. Improving pulmonary edema and effusions.   Electronically Signed   By: Geanie Cooley   On: 07/15/2013 17:53   Dg Chest Port 1 View  07/14/2013   CLINICAL DATA:  Hypoxemia, low blood pressure, respiratory distress.  EXAM: PORTABLE CHEST - 1 VIEW  COMPARISON:  07/12/2013  FINDINGS: Diffuse perihilar and scratch head diffuse bilateral airspace opacities compatible with edema. Heart is borderline in size. Suspect layering effusions. No acute bony abnormality.  IMPRESSION: Moderate pulmonary edema and layering effusions.   Electronically Signed   By: Charlett Nose M.D.   On: 07/14/2013 22:34    PHYSICAL EXAM    Patient is comfortable. He still has edema in the left leg.   ASSESSMENT AND PLAN:    Acute systolic CHF (congestive heart failure)    Continue diuresis watching renal function.   Willa Rough 08/04/2013 11:29 AM

## 2013-08-04 NOTE — Plan of Care (Signed)
Problem: RH BLADDER ELIMINATION Goal: RH STG MANAGE BLADDER WITH EQUIPMENT WITH ASSISTANCE STG Manage Bladder With Equipment With Min Assistance  Outcome: Not Progressing Foley anticipated to be d/c'd 10/20

## 2013-08-04 NOTE — Progress Notes (Signed)
Physical Therapy Note  Patient Details  Name: BEAUDEN TREMONT MRN: [id number] Date of Birth: June 28, 1961 Today's Date: 08/04/2013  1430-1500 (30 minutes) individual Pain: 3/10 RT BKA/ back  / premedicated Focus of treatment : Bilateral LE AROM/ strengthening exercises including RT BKA  Treatment: Pt in bed upon arrival ; Therapeutic exercises in supine X 20 as follows: left ankle pumps, bilateral hip flexion/extension, hip abduction, SAQs.    Catcher Dehoyos,JIM 08/04/2013, 2:58 PM

## 2013-08-04 NOTE — Progress Notes (Signed)
Occupational Therapy Session Note  Patient Details  Name: Albert [name] MRN: [id number] Date of Birth: Jan 28, 1961  Today's Date: 08/04/2013 Time: 1345-1430 Time Calculation (min): 45 min  Skilled Therapeutic Interventions/Progress Updates: Patient completed ADL EOB with overall min A, requesting assist to wash right buttock due to hemiparesis in right UE from old CVA.  He completed lateral leans and leaned back onto bed to wash front periearea and left buttock.  Patient attempted to use reacher to don pants, but catheter bag leaked onto his pajama pants; so, he donned a shirt and gown instead.  Patient was left with nursing and physical therapy to complete pain meds and finish donning hospital gown at the end of the OT session.     Therapy Documentation Precautions:  Precautions Precautions: Fall Precaution Comments: R BKA, history of right hemiparesis secondary to old CVA Restrictions Weight Bearing Restrictions: Yes RLE Weight Bearing: Non weight bearing Other Position/Activity Restrictions: elevate operated limb, do not prop pillows under knee; try to position on LEFT side so that RIGHT hip can be in neutral/extension    Vital Signs: Therapy Vitals Temp: 98 F (36.7 C) Temp src: Oral Pulse Rate: 100 Resp: 18 BP: 138/82 mmHg Patient Position, if appropriate: Lying Oxygen Therapy SpO2: 95 % O2 Device: None (Room air) Pain:8/10 low back.  RN gave pain meds  See FIM for current functional status  Therapy/Group: Individual Therapy  Bud Face [facility] 08/04/2013, 3:58 PM

## 2013-08-04 NOTE — Plan of Care (Signed)
Problem: RH BOWEL ELIMINATION Goal: RH STG MANAGE BOWEL W/MEDICATION W/ASSISTANCE STG Manage Bowel with Medication with MIn Assistance.  Outcome: Progressing BM after sorbitol 10/19. LBM 10/16, takes miralax, senna daily

## 2013-08-05 ENCOUNTER — Inpatient Hospital Stay (HOSPITAL_COMMUNITY): Payer: 59 | Admitting: *Deleted

## 2013-08-05 ENCOUNTER — Inpatient Hospital Stay (HOSPITAL_COMMUNITY): Payer: 59

## 2013-08-05 LAB — GLUCOSE, CAPILLARY
Glucose-Capillary: 147 mg/dL — ABNORMAL HIGH (ref 70–99)
Glucose-Capillary: 112 mg/dL — ABNORMAL HIGH (ref 70–99)
Glucose-Capillary: 192 mg/dL — ABNORMAL HIGH (ref 70–99)
Glucose-Capillary: 218 mg/dL — ABNORMAL HIGH (ref 70–99)

## 2013-08-05 LAB — BASIC METABOLIC PANEL
Calcium: 8.1 mg/dL — ABNORMAL LOW (ref 8.4–10.5)
Creatinine, Ser: 1.27 mg/dL (ref 0.50–1.35)
GFR calc Af Amer: 75 mL/min — ABNORMAL LOW (ref >90)
GFR calc non Af Amer: 64 mL/min — ABNORMAL LOW (ref >90)
Glucose, Bld: 179 mg/dL — ABNORMAL HIGH (ref 70–99)
Potassium: 3.8 mEq/L (ref 3.5–5.1)
Sodium: 139 mEq/L (ref 135–145)

## 2013-08-05 MED ORDER — BETHANECHOL CHLORIDE 25 MG PO TABS
25.0000 mg | ORAL_TABLET | Freq: Three times a day (TID) | ORAL | Status: DC
  Administered 2013-08-05 (×2): 25 mg via ORAL
  Filled 2013-08-05 (×7): qty 1

## 2013-08-05 MED ORDER — TAMSULOSIN HCL 0.4 MG PO CAPS
0.4000 mg | ORAL_CAPSULE | Freq: Every day | ORAL | Status: DC
  Administered 2013-08-05 – 2013-08-07 (×3): 0.4 mg via ORAL
  Filled 2013-08-05 (×4): qty 1

## 2013-08-05 MED ORDER — FUROSEMIDE 80 MG PO TABS
80.0000 mg | ORAL_TABLET | Freq: Two times a day (BID) | ORAL | Status: DC
  Administered 2013-08-05 – 2013-08-08 (×6): 80 mg via ORAL
  Filled 2013-08-05 (×9): qty 1

## 2013-08-05 NOTE — Progress Notes (Addendum)
Occupational Therapy Weekly Progress Note  Patient Details  Name: Albert [name] MRN: [id number] Date of Birth: 25-Aug-1961  Today's Date: 08/05/2013 Time: 0730-0830 and 1130-1200 Time Calculation (min): 60 min and 30 min   Patient has met all STGs and has been focusing on LTGs.  Patient has progressed well during his therapy stay and is supervision for functional transfers and self-care tasks. Patient requires mod assist for toileting secondary to requiring assist for thoroughness when wiping due to decreased ROM and strength in RUE from previous CVA. Patient's wife will be coming in for family training this week prior to discharge.   Patient continues to demonstrate the following deficits: decreased strength, decreased activity tolerance, decreased functional use of RUE (old CVA), decreased standing balance and therefore will continue to benefit from skilled OT intervention to enhance overall performance with BADL.  Patient progressing toward long term goals..  Continue plan of care.  OT Short Term Goals Week 3:   Focus on LTGs  Skilled Therapeutic Interventions/Progress Updates:    Session 1: Pt received finishing breakfast. Therapy session focused on functional transfers, lateral leans, functional endurance during ADL retraining. Pt completed bathing while sitting EOB with supervision and increased time secondary to requiring rest breaks d/t fatigue during lateral leans. Completed LB dressing using reacher and lateral lean technique as well. Pt transferred to w/c with supervision and min verbal cues for correct placement of board. Pt completed oral care at sink. At end of session pt completed 3 sets x5 of w/c pushups for UB strengthening and endurance. Pt left sitting in w/c with all needs in reach.  Session 2: Therapy session focused on toilet transfer to [facility] and clothing management on BSC. Pt received in w/c and discussed home setup with toileting. Pt will have BSC placed perpendicular to  bed at home. Pt transferred with supervision using sliding board w/c>bed>BSC. Pt required increased time to manage clothing up/down using lateral lean technique. Pt then transferred back to bed then to w/c with supervision using sliding board. Pt fatigued at end of therapy session however reporting feeling "successful" about toileting. Pt left eating lunch with all needs in reach.   Therapy Documentation Precautions:  Precautions Precautions: Fall Precaution Comments: R BKA, history of right hemiparesis secondary to old CVA Restrictions Weight Bearing Restrictions: Yes RLE Weight Bearing: Non weight bearing Other Position/Activity Restrictions: elevate operated limb, do not prop pillows under knee; try to position on LEFT side so that RIGHT hip can be in neutral/extension General:   Vital Signs: Therapy Vitals BP: 137/97 mmHg Pain: No c/o pain during therapy sessions.   See FIM for current functional status  Therapy/Group: Individual Therapy  Daneil Dan 08/05/2013, 10:29 AM

## 2013-08-05 NOTE — Progress Notes (Signed)
I have reviewed and agree with the treatment as reflected in this note. Marylynne Keelin, PT DPT  

## 2013-08-05 NOTE — Progress Notes (Signed)
Physical Therapy Weekly Progress Note  Patient Details  Name: Albert [name] MRN: [id number] Date of Birth: 1961-10-02  Today's Date: 08/05/2013 Time: 0930-1030 Time Calculation (min): 60 min  Patient has met 5 of 6 long term goals. Patient has not met goal for car transfer supervision level, secondary to requiring assistance with movement of L LE when exiting car. Patient is currently supervision level for wheelchair mobility and transfers, and modI for bed mobility. Patient is progressing to supervision overall.  Patient continues to demonstrate the following deficits: activity tolerance, strength, functional use of R UE and L LE, balance and postural control and therefore will continue to benefit from skilled PT intervention to enhance overall performance with activity tolerance, balance, postural control, ability to compensate for deficits, functional use of  right upper extremity and left lower extremity, coordination and knowledge of precautions.  See Patient's Care Plan for progression toward long term goals.  Patient progressing toward long term goals..  Continue plan of care.  Skilled Therapeutic Interventions/Progress Updates:    Patient received seated in wheelchair, nurse tech present. Session focused on wheelchair mobility, functional transfers, and B UE/LE strengthening. Patient reports that the ramp at his home should be installed Tuesday or Wednesday of this week and should be ready by the time he is discharged on Thursday. Patient instructed in wheelchair mobility 175'x1 supervision level. Patient instructed in sit<>stand transfers in parallel bars x3 trials maxA, for B UE and LE strengthening, endurance and standing tolerance, dycem placed under patients L LE to prevent slipping. Patient used B UE on parallel bars to pull himself up to standing.  Verbal cues for scooting forward in chair, hand placement on parallel bars, increased weight bearing and push off with LLE, and anterior  weight shift. Manual facilitation provided for increased anterior weight shift to facilitate stand. First trial, patient maintained static standing min-modA 1 minute 40 seconds, with B UE support. Second and third trial patient maxA sit<>stand, able to maintain static standing with B UE support for 10 seconds and for 30 seconds. During static standing verbal cues for upright posture and for placement of hands on parallel bars closer to patient for increased support. Patient squat pivot transfer wheelchair <>NuStep +2totalA for safety (maxA for transfer), dycem placed under patients L LE to prevent slipping verbal cues for hand placement and anterior weight shift. Patient instructed in use of NuStep L LE and B UE, for endurance, strengthening and increased grip strength with R UE, level 7 for 8 minutes. Right residual limb propped on bench for resting support.  Patient returned to room, seated in wheelchair with all needs within reach.    Therapy Documentation Precautions:  Precautions Precautions: Fall Precaution Comments: R BKA, history of right hemiparesis secondary to old CVA Restrictions Weight Bearing Restrictions: Yes RLE Weight Bearing: Non weight bearing Other Position/Activity Restrictions: elevate operated limb, do not prop pillows under knee; try to position on LEFT side so that RIGHT hip can be in neutral/extension   Pain: Pain Assessment Pain Assessment: No/denies pain Pain Score: 0-No pain   Locomotion : Wheelchair Mobility Distance: 175    See FIM for current functional status  Therapy/Group: Individual Therapy  Doralee Albino 08/05/2013, 11:23 AM

## 2013-08-05 NOTE — Progress Notes (Signed)
I have reviewed and agree with the treatment as reflected in this note. Itzae Miralles, PT DPT  

## 2013-08-05 NOTE — Plan of Care (Signed)
Problem: RH BOWEL ELIMINATION Goal: RH STG MANAGE BOWEL WITH ASSISTANCE STG Manage Bowel with Min Assistance.  Pt has foley  Problem: RH BLADDER ELIMINATION Goal: RH STG MANAGE BLADDER WITH EQUIPMENT WITH ASSISTANCE STG Manage Bladder With Equipment With Min Assistance  Foley

## 2013-08-05 NOTE — Progress Notes (Signed)
Subjective:  No chest pain or dyspnea. Rhythm stable on bedside exam. Chest xray Friday showed improvement in CHF.  Objective:  Vital Signs in the last 24 hours: Temp:  [98 F (36.7 C)] 98 F (36.7 C) (10/20 0510) Pulse Rate:  [91-100] 91 (10/20 0510) Resp:  [18] 18 (10/20 0510) BP: (128-138)/(78-97) 137/97 mmHg (10/20 0637) SpO2:  [93 %-95 %] 93 % (10/20 0510)  Intake/Output from previous day: 10/19 0701 - 10/20 0700 In: 960 [P.O.:960] Out: 2000 [Urine:2000] Intake/Output from this shift:    . aspirin  81 mg Oral Daily  . atorvastatin  40 mg Oral q1800  . carvedilol  25 mg Oral BID WC  . famotidine  20 mg Oral BID  . furosemide  60 mg Intravenous BID  . hydrALAZINE  10 mg Oral Q6H  . insulin aspart  0-15 Units Subcutaneous TID WC  . insulin detemir  23 Units Subcutaneous QHS  . polyethylene glycol  17 g Oral Daily  . senna-docusate  1 tablet Oral BID      Physical Exam: The patient appears to be in no distress.  Head and neck exam reveals that the pupils are equal and reactive.  The extraocular movements are full.  There is no scleral icterus.  Mouth and pharynx are benign.  No lymphadenopathy.  No carotid bruits.  The jugular venous pressure is normal.  Thyroid is not enlarged or tender.  Chest reveals no rales or wheezes. Decreased breath sounds at bases. Heart reveals no abnormal lift or heave.  First and second heart sounds are normal.  There is no murmur gallop rub or click.  The abdomen is soft and nontender.  Bowel sounds are normoactive.  There is no hepatosplenomegaly or mass.  There are no abdominal bruits.  Extremities reveal R BKA. 2+ edema left leg.  Neurologic exam is normal strength and no lateralizing weakness.  No sensory deficits.  Integument reveals no rash  Lab Results:  Recent Labs  08/03/13 0600  WBC 4.2  HGB 8.0*  PLT 263    Recent Labs  08/03/13 0600 08/05/13 0550  NA 142 139  K 4.8 3.8  CL 101 99  CO2 30 30  GLUCOSE  211* 179*  BUN 40* 40*  CREATININE 1.44* 1.27   No results found for this basename: TROPONINI, CK, MB,  in the last 72 hours Hepatic Function Panel No results found for this basename: PROT, ALBUMIN, AST, ALT, ALKPHOS, BILITOT, BILIDIR, IBILI,  in the last 72 hours No results found for this basename: CHOL,  in the last 72 hours No results found for this basename: PROTIME,  in the last 72 hours  Imaging: Imaging results have been reviewed.  Chest xray improved.  Cardiac Studies: None. Assessment/Plan:  1. Recent sepsis/MSSA bacteremia: (adm 9/9->9/17->CIR) from gangrenous R foot s/p R BKA 06/29/13, TEE negative for vegetation 07/03/13  2. CAD: 3-vessel CAD by cath 06/28/13. He will need CABG when he recovers from present illness. Continue beta blocker, ASA and statin.  3. Ischemic cardiomyopathy with acute systolic CHF: LVEF 40-45% by echo on 07/15/13. Moderate mitral regurgitation by TEE 07/03/13 .  Switch to oral lasix and follow renal function. No ACEI yet given renal insufficiency. Continue hydralazine for afterload reduction.  4. Acute hypoxic respiratory failure: Improved  5. Acute renal failure Resolved. Follow renal function closely with diuresis. Renal function continues to improve. 6. Anemia: Follow. Right BKA stump still has bloody drainage according to patient.  Plan: Will switch  from IV to oral lasix.   LOS: 10 days    Cassell Clement 08/05/2013, 7:41 AM

## 2013-08-05 NOTE — Progress Notes (Signed)
52 year old patient admitted to rehabilitation following a right BKA. Medical problems include hypertension and diabetes as well his peripheral vascular disease. Hospital course complicated by acute kidney injury and NSTEMI. He has a history of coronary artery disease and CABG is being considered post recovery for his triple-vessel disease.  Subjective/Complaints: Still with foley Cardiology D/Ced IV lasix Bloody drainage from R lateral stump  Objective:  Vital Signs: Blood pressure 137/97, pulse 91, temperature 98 F (36.7 C), temperature source Oral, resp. rate 18, weight 118.616 kg (261 lb 8 oz), SpO2 93.00%. No results found. Results for orders placed during the hospital encounter of 07/26/13 (from the past 72 hour(s))  GLUCOSE, CAPILLARY     Status: Abnormal   Collection Time    08/02/13 11:18 AM      Result Value Range   Glucose-Capillary 109 (*) 70 - 99 mg/dL   Comment 1 Notify RN    GLUCOSE, CAPILLARY     Status: Abnormal   Collection Time    08/02/13  4:35 PM      Result Value Range   Glucose-Capillary 123 (*) 70 - 99 mg/dL   Comment 1 Notify RN    GLUCOSE, CAPILLARY     Status: Abnormal   Collection Time    08/02/13  8:20 PM      Result Value Range   Glucose-Capillary 161 (*) 70 - 99 mg/dL  GLUCOSE, CAPILLARY     Status: Abnormal   Collection Time    08/03/13 12:57 AM      Result Value Range   Glucose-Capillary 230 (*) 70 - 99 mg/dL  BASIC METABOLIC PANEL     Status: Abnormal   Collection Time    08/03/13  6:00 AM      Result Value Range   Sodium 142  135 - 145 mEq/L   Potassium 4.8  3.5 - 5.1 mEq/L   Chloride 101  96 - 112 mEq/L   CO2 30  19 - 32 mEq/L   Glucose, Bld 211 (*) 70 - 99 mg/dL   BUN 40 (*) 6 - 23 mg/dL   Creatinine, Ser 1.47 (*) 0.50 - 1.35 mg/dL   Calcium 8.1 (*) 8.4 - 10.5 mg/dL   GFR calc non Af Amer 55 (*) >90 mL/min   GFR calc Af Amer 64 (*) >90 mL/min   Comment: (NOTE)     The eGFR has been calculated using the CKD EPI equation.     This  calculation has not been validated in all clinical situations.     eGFR's persistently <90 mL/min signify possible Chronic Kidney     Disease.  CBC     Status: Abnormal   Collection Time    08/03/13  6:00 AM      Result Value Range   WBC 4.2  4.0 - 10.5 K/uL   RBC 3.20 (*) 4.22 - 5.81 MIL/uL   Hemoglobin 8.0 (*) 13.0 - 17.0 g/dL   HCT 82.9 (*) 56.2 - 13.0 %   MCV 82.5  78.0 - 100.0 fL   MCH 25.0 (*) 26.0 - 34.0 pg   MCHC 30.3  30.0 - 36.0 g/dL   RDW 86.5 (*) 78.4 - 69.6 %   Platelets 263  150 - 400 K/uL  GLUCOSE, CAPILLARY     Status: Abnormal   Collection Time    08/03/13  7:22 AM      Result Value Range   Glucose-Capillary 200 (*) 70 - 99 mg/dL  GLUCOSE, CAPILLARY  Status: Abnormal   Collection Time    08/03/13 11:43 AM      Result Value Range   Glucose-Capillary 210 (*) 70 - 99 mg/dL  GLUCOSE, CAPILLARY     Status: Abnormal   Collection Time    08/03/13  4:45 PM      Result Value Range   Glucose-Capillary 224 (*) 70 - 99 mg/dL  GLUCOSE, CAPILLARY     Status: Abnormal   Collection Time    08/03/13  9:11 PM      Result Value Range   Glucose-Capillary 183 (*) 70 - 99 mg/dL  GLUCOSE, CAPILLARY     Status: Abnormal   Collection Time    08/04/13  7:20 AM      Result Value Range   Glucose-Capillary 127 (*) 70 - 99 mg/dL  GLUCOSE, CAPILLARY     Status: Abnormal   Collection Time    08/04/13 11:52 AM      Result Value Range   Glucose-Capillary 119 (*) 70 - 99 mg/dL  GLUCOSE, CAPILLARY     Status: Abnormal   Collection Time    08/04/13  4:57 PM      Result Value Range   Glucose-Capillary 217 (*) 70 - 99 mg/dL  GLUCOSE, CAPILLARY     Status: Abnormal   Collection Time    08/04/13  8:41 PM      Result Value Range   Glucose-Capillary 251 (*) 70 - 99 mg/dL  BASIC METABOLIC PANEL     Status: Abnormal   Collection Time    08/05/13  5:50 AM      Result Value Range   Sodium 139  135 - 145 mEq/L   Potassium 3.8  3.5 - 5.1 mEq/L   Chloride 99  96 - 112 mEq/L   CO2 30   19 - 32 mEq/L   Glucose, Bld 179 (*) 70 - 99 mg/dL   BUN 40 (*) 6 - 23 mg/dL   Creatinine, Ser 1.61  0.50 - 1.35 mg/dL   Calcium 8.1 (*) 8.4 - 10.5 mg/dL   GFR calc non Af Amer 64 (*) >90 mL/min   GFR calc Af Amer 75 (*) >90 mL/min   Comment: (NOTE)     The eGFR has been calculated using the CKD EPI equation.     This calculation has not been validated in all clinical situations.     eGFR's persistently <90 mL/min signify possible Chronic Kidney     Disease.  GLUCOSE, CAPILLARY     Status: Abnormal   Collection Time    08/05/13  7:29 AM      Result Value Range   Glucose-Capillary 147 (*) 70 - 99 mg/dL     HEENT: normal Cardio: RRR and no murmur Resp: CTA B/L and unlabored GI: BS positive and non distended Extremity:  No Edema Skin:   Wound R BK stump medial eschar loosening, no erythema tried bloody drainage on dressing, left distal foot status post digits 2 through 5 amputation. Small open area at midpoint distal no drainage no odor Neuro: Alert/Oriented, Anxious, Abnormal Motor 3-/5 R delt , bi, tri, grip, 4/5 R HF, 5/5 in L delt bi tri grip,4/5 L HF knee ext and 3- ankle DF and Tone:  Within Normal Limits Musc/Skel:  Extremity tender R stump limb guard Gen NAD   Assessment/Plan: 1. Functional deficits secondary to R BKA and deconditioning which require 3+ hours per day of interdisciplinary therapy in a comprehensive inpatient rehab setting. Physiatrist is providing  close team supervision and 24 hour management of active medical problems listed below. Physiatrist and rehab team continue to assess barriers to discharge/monitor patient progress toward functional and medical goals. FIM: FIM - Bathing Bathing Steps Patient Completed: Chest;Left lower leg (including foot);Right Arm;Left Arm;Abdomen;Front perineal area;Right upper leg;Left upper leg Bathing: 4: Min-Patient completes 8-9 28f 10 parts or 75+ percent (lateral leans to wash buttocks)  FIM - Upper Body  Dressing/Undressing Upper body dressing/undressing steps patient completed: Thread/unthread right sleeve of pullover shirt/dresss;Pull shirt over trunk;Thread/unthread left sleeve of pullover shirt/dress;Put head through opening of pull over shirt/dress Upper body dressing/undressing: 5: Set-up assist to: Obtain clothing/put away FIM - Lower Body Dressing/Undressing Lower body dressing/undressing steps patient completed: Thread/unthread right pants leg;Thread/unthread left pants leg Lower body dressing/undressing: 4: Min-Patient completed 75 plus % of tasks  FIM - Toileting Toileting steps completed by patient: Adjust clothing prior to toileting (lateral leans on HD drop arm commode) Toileting: 0: Activity did not occur  FIM - Diplomatic Services operational officer Devices: Bedside commode;Sliding board (HD drop arm commode) Toilet Transfers: 0-Activity did not occur  FIM - Architectural technologist Transfer: 5: Bed > Chair or W/C: Supervision (verbal cues/safety issues)  FIM - Locomotion: Wheelchair Distance: 300' Locomotion: Wheelchair: 1: Total Assistance/staff pushes wheelchair (Pt<25%) FIM - Locomotion: Ambulation Ambulation/Gait Assistance: Not tested (comment) Locomotion: Ambulation: 0: Activity did not occur  Comprehension Comprehension Mode: Auditory Comprehension: 6-Follows complex conversation/direction: With extra time/assistive device  Expression Expression Mode: Verbal Expression: 6-Expresses complex ideas: With extra time/assistive device  Social Interaction Social Interaction: 6-Interacts appropriately with others with medication or extra time (anti-anxiety, antidepressant).  Problem Solving Problem Solving: 6-Solves complex problems: With extra time  Memory Memory: 6-More than reasonable amt of time  Medical Problem List and Plan:  1. right BKA due to  Charcot foot/abscess complicated by wound  dehiscence after fall, prior left CVA with residual right-sided weakness, cont dressing changes 2. DVT Prophylaxis/Anticoagulation: SCDs left lower extremity  3. Pain Management: Presently Tylenol only. Will monitor with increased therapy  4. Mood: Xanax 0.25 mg twice a day as needed. Provide emotional support  5. Neuropsych: This patient is capable of making decisions on his own behalf.  6. Recent multi-toe amputation left foot February 2014. dressing changes followed by wound care nurse. Improving 7. ID. Sepsis/MSSA bacteremia. Ancef completed 8. CAD/NSTEMIx2. Continue aspirin therapy. Followup cardiothoracic surgery to consider CABG in the near future  9. Diastolic congestive heart failure. Oral Lasix therapy as directed for now. Cardiology services will continue to follow and adjust Lasix as needed. Check daily weights with I's and O's.  10. Diabetes mellitus peripheral neuropathy. Hemoglobin A1c 8.2 Levemir 23 units each bedtime. Check blood sugars a.c. and at bedtime  11. Hypertension. Hydralazine 25 mg every 6 hours, Coreg 25 mg twice a day. Monitor with increased mobility  12. Acute renal failure. Renal function improved with latest creatinine 1.27. Followup chemistries  13. Acute blood loss anemia. Latest hemoglobin 8.0 stable 14.  Urinary retention- add flomax urecholine, d/c foley   LOS (Days) 10 A FACE TO FACE EVALUATION WAS PERFORMED  Kennette Cuthrell E 08/05/2013, 8:33 AM

## 2013-08-05 NOTE — Progress Notes (Signed)
Physical Therapy Session Note  Patient Details  Name: Albert [name] MRN: [id number] Date of Birth: 1961-01-09  Today's Date: 08/05/2013 Time: 1330-1415 Time Calculation (min): 45 min  Short Term Goals: Week 2:  PT Short Term Goal 1 (Week 2): STGs=LTGs  Skilled Therapeutic Interventions/Progress Updates:    Patient received seated in wheelchair. Session focused on UE and LE strengthening, functional transfers and wheelchair mobility. Patient sliding board transfer wheelchair>level mat table(R side), supervision with assistance for placement/removal of slide board. Patient instructed in B LE strengthening exercises seated LAQs (3# weight LLE), seated hip flexion (3# weight LLE), supine SLR (3# weight LLE), R and L sidelying abduction, and bridges with L LE 2x10reps each exercise. Verbal/visual cuing for technique and for slow controlled movements with each exercise. Patient mat table>wheelchair slide board transfer (L side) supervision level with assistance for placement/removal of board. Patient instructed in wheelchair push-ups 2x5 reps, verbal cues for full elbow extension when pushing up, increased eccentric tricep control when lowering and increased weight bearing through LLE to help lift bottom. Patient instructed in wheelchair mobility 50'x1 with B UE for propulsion supervision level. Patient continues to demonstrate difficulty managing wheelchair parts, requiring assistance from therapist for placement/removal of leg rests, secondary to R UE hemiplegia. Patient returned to room, seated in wheelchair all needs within reach.   Therapy Documentation Precautions:  Precautions Precautions: Fall Precaution Comments: R BKA, history of right hemiparesis secondary to old CVA Restrictions Weight Bearing Restrictions: Yes RLE Weight Bearing: Non weight bearing Other Position/Activity Restrictions: elevate operated limb, do not prop pillows under knee; try to position on LEFT side so that RIGHT  hip can be in neutral/extension   Pain: Pain Assessment Pain Assessment: No/denies pain Pain Score: 0-10   Locomotion : Wheelchair Mobility Distance: 50   See FIM for current functional status  Therapy/Group: Individual Therapy  Doralee Albino 08/05/2013, 3:01 PM

## 2013-08-05 NOTE — Progress Notes (Signed)
Social Work Patient ID: Albert [name], male   DOB: 1961-10-16, 52 y.o.   MRN: [id number] Pt requires a lightweigth wheelchair to be able to self propel and uses for self care tasks.  He is unable to self propel a standard weight wheelchair. Wife will  be in Wed to do family education.  Will wrap up teaching and discuss follow up needs.

## 2013-08-06 ENCOUNTER — Inpatient Hospital Stay (HOSPITAL_COMMUNITY): Payer: 59 | Admitting: *Deleted

## 2013-08-06 ENCOUNTER — Inpatient Hospital Stay (HOSPITAL_COMMUNITY): Payer: 59

## 2013-08-06 LAB — GLUCOSE, CAPILLARY
Glucose-Capillary: 153 mg/dL — ABNORMAL HIGH (ref 70–99)
Glucose-Capillary: 105 mg/dL — ABNORMAL HIGH (ref 70–99)
Glucose-Capillary: 163 mg/dL — ABNORMAL HIGH (ref 70–99)

## 2013-08-06 MED ORDER — BETHANECHOL CHLORIDE 10 MG PO TABS
10.0000 mg | ORAL_TABLET | Freq: Three times a day (TID) | ORAL | Status: DC
  Administered 2013-08-06 – 2013-08-07 (×4): 10 mg via ORAL
  Filled 2013-08-06 (×7): qty 1

## 2013-08-06 MED ORDER — LISINOPRIL 2.5 MG PO TABS
2.5000 mg | ORAL_TABLET | Freq: Every day | ORAL | Status: DC
  Administered 2013-08-06: 2.5 mg via ORAL
  Filled 2013-08-06 (×3): qty 1

## 2013-08-06 NOTE — Progress Notes (Signed)
Social Work Patient ID: Albert [name], male   DOB: [birth year], 52 y.o.   MRN: [id number] Family education tomorrow and pt preparing for discharge Thursday.  Wife to be here in the am to go through therapies with pt.

## 2013-08-06 NOTE — Progress Notes (Signed)
52 year old patient admitted to rehabilitation following a right BKA. Medical problems include hypertension and diabetes as well his peripheral vascular disease. Hospital course complicated by acute kidney injury and NSTEMI. He has a history of coronary artery disease and CABG is being considered post recovery for his triple-vessel disease.  Subjective/Complaints: Foley out, pt voiding without incont Cardiology D/Ced IV lasix   Objective:  Vital Signs: Blood pressure 144/104, pulse 101, temperature 98.1 F (36.7 C), temperature source Oral, resp. rate 20, weight 118.616 kg (261 lb 8 oz), SpO2 100.00%. No results found. Results for orders placed during the hospital encounter of 07/26/13 (from the past 72 hour(s))  GLUCOSE, CAPILLARY     Status: Abnormal   Collection Time    08/03/13 11:43 AM      Result Value Range   Glucose-Capillary 210 (*) 70 - 99 mg/dL  GLUCOSE, CAPILLARY     Status: Abnormal   Collection Time    08/03/13  4:45 PM      Result Value Range   Glucose-Capillary 224 (*) 70 - 99 mg/dL  GLUCOSE, CAPILLARY     Status: Abnormal   Collection Time    08/03/13  9:11 PM      Result Value Range   Glucose-Capillary 183 (*) 70 - 99 mg/dL  GLUCOSE, CAPILLARY     Status: Abnormal   Collection Time    08/04/13  7:20 AM      Result Value Range   Glucose-Capillary 127 (*) 70 - 99 mg/dL  GLUCOSE, CAPILLARY     Status: Abnormal   Collection Time    08/04/13 11:52 AM      Result Value Range   Glucose-Capillary 119 (*) 70 - 99 mg/dL  GLUCOSE, CAPILLARY     Status: Abnormal   Collection Time    08/04/13  4:57 PM      Result Value Range   Glucose-Capillary 217 (*) 70 - 99 mg/dL  GLUCOSE, CAPILLARY     Status: Abnormal   Collection Time    08/04/13  8:41 PM      Result Value Range   Glucose-Capillary 251 (*) 70 - 99 mg/dL  BASIC METABOLIC PANEL     Status: Abnormal   Collection Time    08/05/13  5:50 AM      Result Value Range   Sodium 139  135 - 145 mEq/L   Potassium 3.8   3.5 - 5.1 mEq/L   Chloride 99  96 - 112 mEq/L   CO2 30  19 - 32 mEq/L   Glucose, Bld 179 (*) 70 - 99 mg/dL   BUN 40 (*) 6 - 23 mg/dL   Creatinine, Ser 2.13  0.50 - 1.35 mg/dL   Calcium 8.1 (*) 8.4 - 10.5 mg/dL   GFR calc non Af Amer 64 (*) >90 mL/min   GFR calc Af Amer 75 (*) >90 mL/min   Comment: (NOTE)     The eGFR has been calculated using the CKD EPI equation.     This calculation has not been validated in all clinical situations.     eGFR's persistently <90 mL/min signify possible Chronic Kidney     Disease.  GLUCOSE, CAPILLARY     Status: Abnormal   Collection Time    08/05/13  7:29 AM      Result Value Range   Glucose-Capillary 147 (*) 70 - 99 mg/dL  GLUCOSE, CAPILLARY     Status: Abnormal   Collection Time    08/05/13 11:51 AM  Result Value Range   Glucose-Capillary 112 (*) 70 - 99 mg/dL  GLUCOSE, CAPILLARY     Status: Abnormal   Collection Time    08/05/13  4:38 PM      Result Value Range   Glucose-Capillary 192 (*) 70 - 99 mg/dL  GLUCOSE, CAPILLARY     Status: Abnormal   Collection Time    08/05/13  9:18 PM      Result Value Range   Glucose-Capillary 218 (*) 70 - 99 mg/dL   Comment 1 Notify RN    GLUCOSE, CAPILLARY     Status: Abnormal   Collection Time    08/06/13  7:13 AM      Result Value Range   Glucose-Capillary 153 (*) 70 - 99 mg/dL   Comment 1 Notify RN       HEENT: normal Cardio: RRR and no murmur Resp: CTA B/L and unlabored GI: BS positive and non distended Extremity:  No Edema Skin:   Wound R BK stump medial eschar loosening, no erythema tried bloody drainage on dressing, left distal foot status post digits 2 through 5 amputation. Small open area at midpoint distal no drainage no odor Neuro: Alert/Oriented, Anxious, Abnormal Motor 3-/5 R delt , bi, tri, grip, 4/5 R HF, 5/5 in L delt bi tri grip,4/5 L HF knee ext and 3- ankle DF and Tone:  Within Normal Limits Musc/Skel:  Extremity tender R stump limb guard Gen NAD   Assessment/Plan: 1.  Functional deficits secondary to R BKA and deconditioning which require 3+ hours per day of interdisciplinary therapy in a comprehensive inpatient rehab setting. Physiatrist is providing close team supervision and 24 hour management of active medical problems listed below. Physiatrist and rehab team continue to assess barriers to discharge/monitor patient progress toward functional and medical goals. FIM: FIM - Bathing Bathing Steps Patient Completed: Chest;Left lower leg (including foot);Right Arm;Left Arm;Abdomen;Front perineal area;Right upper leg;Left upper leg;Buttocks Bathing: 5: Set-up assist to: Obtain items  FIM - Upper Body Dressing/Undressing Upper body dressing/undressing steps patient completed: Thread/unthread right sleeve of pullover shirt/dresss;Pull shirt over trunk;Thread/unthread left sleeve of pullover shirt/dress;Put head through opening of pull over shirt/dress Upper body dressing/undressing: 5: Set-up assist to: Obtain clothing/put away FIM - Lower Body Dressing/Undressing Lower body dressing/undressing steps patient completed: Thread/unthread right pants leg;Thread/unthread left pants leg;Pull pants up/down Lower body dressing/undressing: 4: Min-Patient completed 75 plus % of tasks  FIM - Toileting Toileting steps completed by patient: Adjust clothing after toileting;Adjust clothing prior to toileting Toileting: 3: Mod-Patient completed 2 of 3 steps  FIM - Archivist Transfers Assistive Devices: Bedside commode;Sliding board Toilet Transfers: 5-To toilet/BSC: Supervision (verbal cues/safety issues);5-From toilet/BSC: Supervision (verbal cues/safety issues)  FIM - Banker Devices: Sliding board Bed/Chair Transfer: 5: Bed > Chair or W/C: Supervision (verbal cues/safety issues);5: Chair or W/C > Bed: Supervision (verbal cues/safety issues)  FIM - Locomotion: Wheelchair Distance: 50 Locomotion: Wheelchair: 2:  Travels 50 - 149 ft with supervision, cueing or coaxing FIM - Locomotion: Ambulation Ambulation/Gait Assistance: Not tested (comment) Locomotion: Ambulation: 0: Activity did not occur  Comprehension Comprehension Mode: Auditory Comprehension: 6-Follows complex conversation/direction: With extra time/assistive device  Expression Expression Mode: Verbal Expression: 6-Expresses complex ideas: With extra time/assistive device  Social Interaction Social Interaction: 6-Interacts appropriately with others with medication or extra time (anti-anxiety, antidepressant).  Problem Solving Problem Solving: 6-Solves complex problems: With extra time  Memory Memory: 6-More than reasonable amt of time  Medical Problem List and Plan:  1.  right BKA due to  Charcot foot/abscess complicated by wound dehiscence after fall, prior left CVA with residual right-sided weakness, cont dressing changes 2. DVT Prophylaxis/Anticoagulation: SCDs left lower extremity  3. Pain Management: Presently Tylenol only. Will monitor with increased therapy  4. Mood: Xanax 0.25 mg twice a day as needed. Provide emotional support  5. Neuropsych: This patient is capable of making decisions on his own behalf.  6. Recent multi-toe amputation left foot February 2014. dressing changes followed by wound care nurse. Improving 7. ID. Sepsis/MSSA bacteremia. Ancef completed 8. CAD/NSTEMIx2. Continue aspirin therapy. Followup cardiothoracic surgery to consider CABG in the near future  9. Diastolic congestive heart failure. Oral Lasix therapy as directed for now. Cardiology services will continue to follow and adjust Lasix as needed. Check daily weights with I's and O's.  10. Diabetes mellitus peripheral neuropathy. Hemoglobin A1c 8.2 Levemir 23 units each bedtime. Check blood sugars a.c. and at bedtime  11. Hypertension. Hydralazine 25 mg every 6 hours, Coreg 25 mg twice a day. Monitor with increased mobility  12. Acute renal failure.  Renal function improved with latest creatinine 1.27. Followup chemistries  13. Acute blood loss anemia. Latest hemoglobin 8.0 stable 14.  Urinary retention- add flomax, start taper of urecholine, d/c foley   LOS (Days) 11 A FACE TO FACE EVALUATION WAS PERFORMED  KIRSTEINS,ANDREW E 08/06/2013, 7:40 AM

## 2013-08-06 NOTE — Progress Notes (Signed)
Physical Therapy Session Note  Patient Details  Name: Albert [name] MRN: [id number] Date of Birth: 03-03-1961  Today's Date: 08/06/2013 Time: 0915-1000 and 1430-1530 Time Calculation (min): 45 min and  Short Term Goals: Week 2:  PT Short Term Goal 1 (Week 2): STGs=LTGs  Skilled Therapeutic Interventions/Progress Updates:    AM session: Patient received seated in wheelchair, wife present. Session focused on wheelchair mobility and functional transfers. Brief discussion with wife about family education session on Wednesday wife reports she will be present for family education session, and for her to voice any questions or concerns. Patient instructed in wheelchair mobility B UE 50'x1 supervision level. Performed wheelchair<>car (sedan height) sliding board transfer supervision level, with assistance for placement/removal of board. Wheelchair>car verbal cues for hand placement and removing sliding board first, then turning to place B LE into car. Car>wheelchair verbal instruction to bring B LEs out of car prior to having sliding board placed to transfer back to wheelchair. Patient reports that his wife rented a sedan style car to take him home at discharge. Patient returned to room to use bathroom. Performed slide board transfer wheelchair<>BSC supervision level with assistance for placement/removal of sliding board. Patient able to remove pants without assistance by performing lateral leans. Patient continent of bowel and bladder, able to manage toileting hygiene, required minA for cleaning of bottom, secondary to R UE hemiplegia. Patient required assistance donning pants, no assistance for hand hygiene. Ended session patient seated in wheelchair, all needs within reach. Nursing notified of continent episode.   PM session: Patient received seated in wheelchair. Session focused on wheelchair mobility, sit<>stand transfers, and B UE/LE strengthening. Patient instructed in wheelchair mobility  150'x1 supervision level with B UE. Patient performed 3x sit<>stand in parallel bars with B UE support of bars to assist pulling up to standing, maxA achieving stand and minA for static standing. Verbal cues for scooting forward in chair, increased weight bearing through LLE, and pulling up with  BUE to assist stand. Patient maintained static stand 3x with minA 60", 150" and 180". Patient instructed in B UE/LE exercises for strengthening, 3x5reps wheelchair pushups with verbal cues for full extension of elbows and slow eccentric tricep control when lowering bottom. Performed 3x10reps seated B LE LAQs and hip flexion with 3# weight on LLE, verbal cues for slow controlled movements. Patient returned to room, seated in wheelchair all needs within reach.   Therapy Documentation Precautions:  Precautions Precautions: Fall Precaution Comments: R BKA, history of right hemiparesis secondary to old CVA Restrictions Weight Bearing Restrictions: Yes RLE Weight Bearing: Non weight bearing Other Position/Activity Restrictions: elevate operated limb, do not prop pillows under knee; try to position on LEFT side so that RIGHT hip can be in neutral/extension   Pain: Pain Assessment Pain Assessment: 0-10 Pain Score: 3  Pain Type: Chronic pain Pain Location: Back Pain Orientation: Lower   Locomotion : Wheelchair Mobility Distance: 50   See FIM for current functional status  Therapy/Group: Individual Therapy  Doralee Albino 08/06/2013, 11:13 AM

## 2013-08-06 NOTE — Progress Notes (Signed)
I have reviewed and agree with the treatment as reflected in this note. Albert [name], PT DPT  

## 2013-08-06 NOTE — Progress Notes (Signed)
Occupational Therapy Session Note  Patient Details  Name: Albert [name] MRN: [id number] Date of Birth: Jun 20, 1961  Today's Date: 08/06/2013 Time: 7829-5621 and 1030-1100 Time Calculation (min): 50 min and 30 min   Short Term Goals: Week 2:  OT Short Term Goal 1 (Week 2): Focus on LTGs  Skilled Therapeutic Interventions/Progress Updates:    Session 1: Therapy session focused on activity tolerance, functional transfers, dynamic sitting balance and lateral leans for LB self-care, and strengthening. Pt received sitting upright in bed eating breakfast. Completed sponge bath and dressing with setup assist. Pt required increased time secondary to fatigue and right hemiparesis from previous CVA. Transferred bed>w/c with setup assist via sliding board and pt placing board. Pt then propelled self approx 80 feet in w/c with increased time to increase strength and endurance. Pt placed clothing in washing machine from w/c level and therapist assisted with controls on machine. Pt left in room with RN present.  Session 2: Therapy session focused on strengthening and functional endurance for functional transfers. Pt propelled self in w/c approx 60 feet with increased time for BUE strengthening. Pt assisted with transporting clothing into front loading dryer. OT transported pt to therapy gym via w/c. Pt engaged in ergonometer on level 5.5 for 10 min with 3 rest breaks. Right hand ace wrapped to handles. At end of session pt returned to room and left with all items in reach.   Therapy Documentation Precautions:  Precautions Precautions: Fall Precaution Comments: R BKA, history of right hemiparesis secondary to old CVA Restrictions Weight Bearing Restrictions: Yes RLE Weight Bearing: Non weight bearing Other Position/Activity Restrictions: elevate operated limb, do not prop pillows under knee; try to position on LEFT side so that RIGHT hip can be in neutral/extension General: General Amount of Missed OT  Time (min): 10 Minutes Vital Signs: Therapy Vitals Temp: 98.1 F (36.7 C) Temp src: Oral Pulse Rate: 115 Resp: 20 BP: 112/78 mmHg Patient Position, if appropriate: Sitting Oxygen Therapy SpO2: 100 % O2 Device: None (Room air) Pain: No c/o pain during therapy session 1.  Session 2: Pain 7/10 in back and RN provided pain meds during session.   See FIM for current functional status  Therapy/Group: Individual Therapy  Daneil Dan 08/06/2013, 9:35 AM

## 2013-08-06 NOTE — Progress Notes (Signed)
Subjective:  No chest pain or dyspnea. Rhythm stable on bedside exam. Chest xray Friday showed improvement in CHF. Renal function improving. BP still running high.  Objective:  Vital Signs in the last 24 hours: Temp:  [98 F (36.7 C)-98.1 F (36.7 C)] 98.1 F (36.7 C) (10/21 1610) Pulse Rate:  [101-103] 101 (10/21 0614) Resp:  [19-20] 20 (10/21 0614) BP: (121-144)/(69-104) 144/104 mmHg (10/21 0614) SpO2:  [95 %-100 %] 100 % (10/21 0614)  Intake/Output from previous day: 10/20 0701 - 10/21 0700 In: 480 [P.O.:480] Out: 2050 [Urine:2050] Intake/Output from this shift:    . aspirin  81 mg Oral Daily  . atorvastatin  40 mg Oral q1800  . bethanechol  10 mg Oral TID  . carvedilol  25 mg Oral BID WC  . famotidine  20 mg Oral BID  . furosemide  80 mg Oral BID  . hydrALAZINE  10 mg Oral Q6H  . insulin aspart  0-15 Units Subcutaneous TID WC  . insulin detemir  23 Units Subcutaneous QHS  . polyethylene glycol  17 g Oral Daily  . senna-docusate  1 tablet Oral BID  . tamsulosin  0.4 mg Oral QPC supper      Physical Exam: The patient appears to be in no distress.  Head and neck exam reveals that the pupils are equal and reactive.  The extraocular movements are full.  There is no scleral icterus.  Mouth and pharynx are benign.  No lymphadenopathy.  No carotid bruits.  The jugular venous pressure is normal.  Thyroid is not enlarged or tender.  Chest reveals no rales or wheezes. Decreased breath sounds at bases. Heart reveals no abnormal lift or heave.  First and second heart sounds are normal.  There is no murmur gallop rub or click.  The abdomen is soft and nontender.  Bowel sounds are normoactive.  There is no hepatosplenomegaly or mass.  There are no abdominal bruits.  Extremities reveal R BKA. 2+ edema left leg.  Neurologic exam is normal strength and no lateralizing weakness.  No sensory deficits.  Integument reveals no rash  Lab Results: No results found for this  basename: WBC, HGB, PLT,  in the last 72 hours  Recent Labs  08/05/13 0550  NA 139  K 3.8  CL 99  CO2 30  GLUCOSE 179*  BUN 40*  CREATININE 1.27   No results found for this basename: TROPONINI, CK, MB,  in the last 72 hours Hepatic Function Panel No results found for this basename: PROT, ALBUMIN, AST, ALT, ALKPHOS, BILITOT, BILIDIR, IBILI,  in the last 72 hours No results found for this basename: CHOL,  in the last 72 hours No results found for this basename: PROTIME,  in the last 72 hours  Imaging: Imaging results have been reviewed.  Chest xray improved.  Cardiac Studies: None. Assessment/Plan:  1. Recent sepsis/MSSA bacteremia: (adm 9/9->9/17->CIR) from gangrenous R foot s/p R BKA 06/29/13, TEE negative for vegetation 07/03/13  2. CAD: 3-vessel CAD by cath 06/28/13. He will need CABG when he recovers from present illness. Continue beta blocker, ASA and statin.  3. Ischemic cardiomyopathy with acute systolic CHF: LVEF 40-45% by echo on 07/15/13. Moderate mitral regurgitation by TEE 07/03/13 .  Switch to oral lasix and follow renal function. Will start low dose ACE. Continue hydralazine for afterload reduction.  4. Acute hypoxic respiratory failure: Improved  5. Acute renal failure Resolved. Follow renal function closely with diuresis. Renal function continues to improve. 6. Anemia:  Follow. Right BKA stump still has bloody drainage according to patient.  Plan: Will start ACE for his LV systolic dysfunction. Watch renal function.   LOS: 11 days    Cassell Clement 08/06/2013, 8:20 AM

## 2013-08-07 ENCOUNTER — Inpatient Hospital Stay (HOSPITAL_COMMUNITY): Payer: 59 | Admitting: *Deleted

## 2013-08-07 ENCOUNTER — Inpatient Hospital Stay (HOSPITAL_COMMUNITY): Payer: 59

## 2013-08-07 LAB — BASIC METABOLIC PANEL
BUN: 29 mg/dL — ABNORMAL HIGH (ref 6–23)
CO2: 30 mEq/L (ref 19–32)
Chloride: 101 mEq/L (ref 96–112)
GFR calc Af Amer: 75 mL/min — ABNORMAL LOW (ref >90)
Potassium: 4.1 mEq/L (ref 3.5–5.1)
Sodium: 140 mEq/L (ref 135–145)

## 2013-08-07 LAB — GLUCOSE, CAPILLARY
Glucose-Capillary: 122 mg/dL — ABNORMAL HIGH (ref 70–99)
Glucose-Capillary: 101 mg/dL — ABNORMAL HIGH (ref 70–99)
Glucose-Capillary: 145 mg/dL — ABNORMAL HIGH (ref 70–99)

## 2013-08-07 MED ORDER — LISINOPRIL 5 MG PO TABS
5.0000 mg | ORAL_TABLET | Freq: Every day | ORAL | Status: DC
  Administered 2013-08-07 – 2013-08-08 (×2): 5 mg via ORAL
  Filled 2013-08-07 (×3): qty 1

## 2013-08-07 MED ORDER — COLLAGENASE 250 UNIT/GM EX OINT
TOPICAL_OINTMENT | Freq: Every day | CUTANEOUS | Status: DC
  Administered 2013-08-07 – 2013-08-08 (×2): via TOPICAL
  Filled 2013-08-07: qty 30

## 2013-08-07 NOTE — Progress Notes (Signed)
Social Work Patient ID: Albert [name], male   DOB: October 17, 1961, 52 y.o.   MRN: [id number] Met with pt and wife who is here for family education.  Came earlier then scheduled for.  Wife reports it went well and she feels comfortable with his care for home. Have arranged Home health follow up and DME to be delivered today to pt's room.  Aware of team conference reached his goals.

## 2013-08-07 NOTE — Plan of Care (Signed)
Problem: RH SKIN INTEGRITY Goal: RH STG ABLE TO PERFORM INCISION/WOUND CARE W/ASSISTANCE STG Able To Perform Incision/Wound Care With Mod Assistance.  Outcome: Not Progressing Pt total assist for wound care to right BKA, no family present for instruction on care of Right BKA

## 2013-08-07 NOTE — Patient Care Conference (Signed)
Inpatient RehabilitationTeam Conference and Plan of Care Update Date: 08/07/2013   Time: 10;50 AM    Patient Name: Albert [name]      Medical Record Number: [id number]  Date of Birth: 04-Sep-1961 Sex: Male         Room/Bed: 4W01C/4W01C-01 Payor Info: Payor: Advertising copywriter / Plan: Advertising copywriter / Product Type: *No Product type* /    Admitting Diagnosis: R BKA  Admit Date/Time:  07/26/2013  1:46 PM Admission Comments: No comment available   Primary Diagnosis:  Hx of right BKA Principal Problem: Hx of right BKA  Patient Active Problem List   Diagnosis Date Noted  . Acute systolic CHF (congestive heart failure) 08/03/2013  . Hx of right BKA 07/26/2013  . Anemia 07/15/2013  . Hyperkalemia 07/15/2013  . Hyposmolality and/or hyponatremia 07/15/2013  . Acute respiratory failure 07/15/2013  . Mitral regurgitation 07/15/2013  . Oliguria 07/15/2013  . Bacteremia due to Staphylococcus aureus 07/15/2013  . Hyperchloremia 07/15/2013  . Acute kidney failure 07/14/2013  . Hypotension, unspecified 07/14/2013  . Acute systolic heart failure 07/12/2013  . Acute pulmonary edema 07/11/2013  . Pneumonia, organism unspecified 07/11/2013  . Hypoxemia 07/11/2013  . Hx of BKA 07/04/2013  . Cardiomyopathy, ischemic 07/03/2013  . Coronary atherosclerosis of native coronary artery 07/01/2013  . NSTEMI (non-ST elevated myocardial infarction) 06/26/2013  . AKI (acute kidney injury) 06/25/2013  . Foot abscess, right 06/25/2013  . Shock circulatory 06/25/2013  . Dehydration with hyponatremia 06/25/2013  . Leukocytosis, unspecified 06/25/2013  . Sepsis(995.91) 06/25/2013  . Aftercare following surgery of the circulatory system, NEC 02/11/2013  . Peripheral vascular disease, unspecified 12/31/2012  . Diabetes mellitus out of control 12/17/2012  . Essential hypertension, benign 12/17/2012  . Atherosclerotic peripheral vascular disease 12/17/2012  . Gangrene 12/08/2012  . Hypercholesteremia      Expected Discharge Date: Expected Discharge Date: 08/08/13  Team Members Present: Physician leading conference: Dr. Claudette Laws Social Worker Present: Staci Acosta, LCSW;Becky Indianna Boran, LCSW Nurse Present: Gregor Hams, RN PT Present: Cyndia Skeeters, PT OT Present: Bretta Bang, Carollee Sires, OT SLP Present: Maxcine Ham, SLP PPS Coordinator present : Edson Snowball, PT     Current Status/Progress Goal Weekly Team Focus  Medical     Medically stable-po lasix Dr Lajoyce Corners to see and look at stump   Readiness for discharge   Preparing for discharge  Bowel/Bladder   continent of bowel and bladder/ urinal with urgency at times: LBM 08/05/13  To regain normal bladder function  continue to assess urinary function   Swallow/Nutrition/ Hydration     [facility]        ADL's   supervision with sliding board transfers to bed, BSC, etc. Mod assist for toileting for hygiene, supervision/setup bathing and dressing while supine and sitting EOB  supervision overall with mod assist for toileting (hygiene)  strengthening, pt/family education, functional transfers, activity tolerance    Mobility   Supervision slideboard transfers and wheelchair level. 300' wheelchair mobility.   Supervision overall. No ambulation or stair goal set.   slide board transfers, wheelchair mobility, strengthening, activity tolearnce, safety.    Communication     [facility]        Safety/Cognition/ Behavioral Observations  no unsafe behavior         Pain   complains of pain to rt leg  pain less than or equal to 3  assess and monitor pain q shift and prn   Skin   Rt BKA with staples 4x4'xs, abd, kerlix, ace wrap. Left  foot with only great toe with several amputations  skin will remain free from further breakdown /infecion  monitor and assess skin qshift and prn    Rehab Goals Patient on target to meet rehab goals: Yes *See Care Plan and progress notes for long and short-term goals.  Barriers to Discharge:     Medically stable-multiple medical issues  Possible Resolutions to Barriers:    Family education today   Discharge Planning/Teaching Needs:  Home with wife who is here for family educaiton today to prepare for discharge tomorrow.  DME and HH referrals made      Team Discussion:  Family education today with wife.  Medically ready and stable-Dr Lajoyce Corners to come by today to look at leg/healing. Voiding well.  Ready for d/c tomorrow  Revisions to Treatment Plan:  Ready for discharge tomorrow      Lucy Chris 08/08/2013, 8:37 AM

## 2013-08-07 NOTE — Discharge Summary (Signed)
Discharge summary job # [id number]

## 2013-08-07 NOTE — Progress Notes (Signed)
Physical Therapy Discharge Summary  Patient Details  Name: MEKAI WILKINSON MRN: [id number] Date of Birth: June 24, 1961  Today's Date: 08/08/2013 Time: 0930-1000 and 1402-1500 Time Calculation (min): 30 min and 58 min  Patient has met 6 of 6 long term goals due to improved activity tolerance, improved balance, improved postural control, increased strength, ability to compensate for deficits, functional use of  right upper extremity, and improved coordination.  Patient to discharge at a wheelchair level Supervision.   Patient's care partner is independent to provide the necessary supervision and set-up  assistance at discharge.  Reasons goals not met: [name]. All LTGs met.   Recommendation:  Patient will benefit from ongoing skilled PT services in home health setting to continue to advance safe functional mobility, address ongoing impairments in activity tolerance, strengthening, coordination, balance, and minimize fall risk.  Equipment: Wheelchair and cushion, and sliding board.   Reasons for discharge: treatment goals met and discharge from hospital  Patient/family agrees with progress made and goals achieved: Yes  Skilled Therapeutic Intervention: AM Session: Patient received seated in wheelchair. Session focused on wheelchair mobility, functional transfers and car transfers. Patients wife present for session for family education. Patient and wife educated on equipment, technique, and safety for car transfers<>wheelchair slide board transfers, wheel<>mat table slide board transfers, placement/removal of slide board, and wheelchair mobility. Discussion with wife regarding recommendations for patient discharging home at a supervision wheelchair level, with patient requiring assistance for management of wheelchair parts. Educated wife and patient on slide board transfers supervision level with wife's assistance needed for placement and removal of the sliding board and stand by assistance for safety.  Patient instructed in wheelchair > elevated mat slide board transfer (L side)  supervision level with assistance for placement of slide board. Wife instructed in appropriate placement of SB under patients upper thigh so that the handle of the board can not be seen. Patient and wife return demonstrated elevated mat>wheelchair SB transfer (R side) supervision level. Wife was able to properly place board and maintain appropriate positioning for safety during transfer. Discussion with wife regarding what to do if a transfer becomes unsafe, educated wife on managing situation rather than trying to life or move patient, rather to just redirect him to the nearest transfer surface. Patient and wife performed wheelchair>mid heigh car SB transfer (L side), wife performed placement of board while standing in front of patient rather than behind and to maintain position in front of patient during transfer for safety. Car>wheelchair transfer (R side), verbal cues for moving B LE out of car first prior to placement of SB. Wife able to demonstrate appropriate placement of board. Wife and patient verbalized understanding of supervision level recommendations and of patient being able to remain seated in wheelchair at home for extended period of time, secondary to having a J2 cushion for pressure relief. Patient has also been educated on wheelchair push-ups for pressure relief. Patient returned to room, seated in wheelchair all needs within reach, wife present.   PM Session: Patient received seated in wheelchair in rehab gym. Session focused on wheelchair mobility, slide board transfers, and B UE/LE strengthening. Patient performed wheelchair<>bed (in ADL apartment) slide board transfer supervision level, no assistance for placement/removal of board. Performed wheelchair<>furniture slide board transfer supervision level no assistance for placement/removal of board. Patient instructed in home exercise program and provided handout  with exercises. Seated in wheelchair performed 3x 5 reps wheelchair pushups (B UE and L LE), 3x10 reps B LAQs (3# weight on L LE),  3x10reps B hip flexion (3# weight on LLE). Patient performed slide board transfer wheelchair<>level mat table supervision level no assistance for placement/removal of board (dycem placed under LLE to prevent slipping). Patient instructed in supine B SLR 1x15reps (3# weight on LLE). Educated patient on number of reps and times daily to complete exercises at home and discussed additional exercises including quad sets and glut sets which were included on the handout. Patient states he has no questions or concerns at this time. Patient returned to room, seated in wheelchair with all needs within reach.   PT Discharge Precautions/Restrictions Restrictions Weight Bearing Restrictions: Yes RLE Weight Bearing: Non weight bearing Pain Pain Assessment Pain Assessment: No/denies pain Pain Score: 0-No pain Vision/Perception  Vision - History Baseline Vision: Wears glasses only for reading Patient Visual Report: No change from baseline  Cognition Overall Cognitive Status: Within Functional Limits for tasks assessed Arousal/Alertness: Awake/alert Orientation Level: Oriented X4 Attention: Focused;Sustained Focused Attention: Appears intact Sustained Attention: Appears intact Memory: Appears intact Awareness: Appears intact Safety/Judgment: Appears intact Sensation Sensation Light Touch: Impaired Detail Light Touch Impaired Details: Impaired LLE (R LE intact, L LE L4 dermatome impaired all others intact. ) Proprioception: Appears Intact Coordination Gross Motor Movements are Fluid and Coordinated: No Fine Motor Movements are Fluid and Coordinated: No Coordination and Movement Description: Rapid alternating movements intact L UE, R UE demonstrates decreased speed and coordination. Motor  Motor Motor: Hemiplegia;Abnormal postural alignment and control Motor - Discharge  Observations: Patient has residual R UE hemiplegia.   Mobility Bed Mobility Bed Mobility: Supine to Sit;Sit to Supine Supine to Sit: 6: Modified independent (Device/Increase time);HOB flat Supine to Sit Details (indicate cue type and reason): Supine>sit HOB flat no bedrails modI.  Sit to Supine: 6: Modified independent (Device/Increase time);HOB flat Sit to Supine - Details (indicate cue type and reason): Sit>supine modI HOB flat and no bedrails.  Transfers Transfers: Yes Lateral/Scoot Transfers: 5: Supervision;With slide board Lateral/Scoot Transfer Details: Verbal cues for precautions/safety;Verbal cues for safe use of DME/AE Lateral/Scoot Transfer Details (indicate cue type and reason): Patient performed wheelchair<> bed, wheelchair<>low level sofa, and wheelchair<>mat table slide board transfers supervision level. Today first day patient able to place/remove board with no assistance. Patient continues to require assistance with placement of wheelchair prior to transfer and management of wheelchair parts (leg rests).    Locomotion  Ambulation Ambulation: No Gait Gait: No Stairs / Additional Locomotion Stairs: No Corporate treasurer: Yes Wheelchair Assistance: 5: Financial planner Details: Verbal cues for precautions/safety;Verbal cues for safe use of DME/AE Wheelchair Propulsion: Both upper extremities Wheelchair Parts Management: Needs assistance Distance: 150;  Trunk/Postural Assessment  Cervical Assessment Cervical Assessment: Within Functional Limits Thoracic Assessment Thoracic Assessment: Within Functional Limits Lumbar Assessment Lumbar Assessment: Within Functional Limits Postural Control Postural Control: Deficits on evaluation Postural Limitations: Flexed trunk posture, forward head posture.   Balance Balance Balance Assessed: Yes Static Sitting Balance Static Sitting - Balance Support: Feet supported Static Sitting - Level of  Assistance: 5: Stand by assistance Static Sitting - Comment/# of Minutes: 3 Dynamic Sitting Balance Dynamic Sitting - Balance Support: Feet supported;During functional activity Dynamic Sitting - Level of Assistance: 5: Stand by assistance Dynamic Sitting Balance - Compensations: 1 Extremity Assessment  RLE Assessment RLE Assessment: Exceptions to [facility] RLE Strength RLE Overall Strength: Deficits;Due to precautions RLE Overall Strength Comments: hip flexion 4-/5, knee flex/ext 3/5  LLE Assessment LLE Assessment: Within Functional Limits LLE Strength LLE Overall Strength: Within Functional Limits for tasks assessed LLE Overall  Strength Comments: Grossly 4/5 hip flex, knee ext/flex, 4-/5 DF, PF  See FIM for current functional status  Doralee Albino 08/08/2013, 11:56 AM

## 2013-08-07 NOTE — Progress Notes (Signed)
52 year old patient admitted to rehabilitation following a right BKA. Medical problems include hypertension and diabetes as well his peripheral vascular disease. Hospital course complicated by acute kidney injury and NSTEMI. He has a history of coronary artery disease and CABG is being considered post recovery for his triple-vessel disease.  Subjective/Complaints: No breathing problems. Primary pain concern is low back related to prolonged sitting. No phantom sensations.   Objective:  Vital Signs: Blood pressure 120/88, pulse 99, temperature 98 F (36.7 C), temperature source Oral, resp. rate 18, weight 118.616 kg (261 lb 8 oz), SpO2 98.00%. No results found. Results for orders placed during the hospital encounter of 07/26/13 (from the past 72 hour(s))  GLUCOSE, CAPILLARY     Status: Abnormal   Collection Time    08/04/13 11:52 AM      Result Value Range   Glucose-Capillary 119 (*) 70 - 99 mg/dL  GLUCOSE, CAPILLARY     Status: Abnormal   Collection Time    08/04/13  4:57 PM      Result Value Range   Glucose-Capillary 217 (*) 70 - 99 mg/dL  GLUCOSE, CAPILLARY     Status: Abnormal   Collection Time    08/04/13  8:41 PM      Result Value Range   Glucose-Capillary 251 (*) 70 - 99 mg/dL  BASIC METABOLIC PANEL     Status: Abnormal   Collection Time    08/05/13  5:50 AM      Result Value Range   Sodium 139  135 - 145 mEq/L   Potassium 3.8  3.5 - 5.1 mEq/L   Chloride 99  96 - 112 mEq/L   CO2 30  19 - 32 mEq/L   Glucose, Bld 179 (*) 70 - 99 mg/dL   BUN 40 (*) 6 - 23 mg/dL   Creatinine, Ser 1.61  0.50 - 1.35 mg/dL   Calcium 8.1 (*) 8.4 - 10.5 mg/dL   GFR calc non Af Amer 64 (*) >90 mL/min   GFR calc Af Amer 75 (*) >90 mL/min   Comment: (NOTE)     The eGFR has been calculated using the CKD EPI equation.     This calculation has not been validated in all clinical situations.     eGFR's persistently <90 mL/min signify possible Chronic Kidney     Disease.  GLUCOSE, CAPILLARY      Status: Abnormal   Collection Time    08/05/13  7:29 AM      Result Value Range   Glucose-Capillary 147 (*) 70 - 99 mg/dL  GLUCOSE, CAPILLARY     Status: Abnormal   Collection Time    08/05/13 11:51 AM      Result Value Range   Glucose-Capillary 112 (*) 70 - 99 mg/dL  GLUCOSE, CAPILLARY     Status: Abnormal   Collection Time    08/05/13  4:38 PM      Result Value Range   Glucose-Capillary 192 (*) 70 - 99 mg/dL  GLUCOSE, CAPILLARY     Status: Abnormal   Collection Time    08/05/13  9:18 PM      Result Value Range   Glucose-Capillary 218 (*) 70 - 99 mg/dL   Comment 1 Notify RN    GLUCOSE, CAPILLARY     Status: Abnormal   Collection Time    08/06/13  7:13 AM      Result Value Range   Glucose-Capillary 153 (*) 70 - 99 mg/dL   Comment 1 Notify RN  GLUCOSE, CAPILLARY     Status: None   Collection Time    08/06/13 11:20 AM      Result Value Range   Glucose-Capillary 94  70 - 99 mg/dL   Comment 1 Notify RN    GLUCOSE, CAPILLARY     Status: Abnormal   Collection Time    08/06/13  4:45 PM      Result Value Range   Glucose-Capillary 105 (*) 70 - 99 mg/dL   Comment 1 Notify RN    GLUCOSE, CAPILLARY     Status: Abnormal   Collection Time    08/06/13  9:09 PM      Result Value Range   Glucose-Capillary 163 (*) 70 - 99 mg/dL  BASIC METABOLIC PANEL     Status: Abnormal   Collection Time    08/07/13  5:50 AM      Result Value Range   Sodium 140  135 - 145 mEq/L   Potassium 4.1  3.5 - 5.1 mEq/L   Chloride 101  96 - 112 mEq/L   CO2 30  19 - 32 mEq/L   Glucose, Bld 125 (*) 70 - 99 mg/dL   BUN 29 (*) 6 - 23 mg/dL   Creatinine, Ser 1.61  0.50 - 1.35 mg/dL   Calcium 8.5  8.4 - 09.6 mg/dL   GFR calc non Af Amer 64 (*) >90 mL/min   GFR calc Af Amer 75 (*) >90 mL/min   Comment: (NOTE)     The eGFR has been calculated using the CKD EPI equation.     This calculation has not been validated in all clinical situations.     eGFR's persistently <90 mL/min signify possible Chronic  Kidney     Disease.  GLUCOSE, CAPILLARY     Status: Abnormal   Collection Time    08/07/13  7:15 AM      Result Value Range   Glucose-Capillary 113 (*) 70 - 99 mg/dL   Comment 1 Notify RN       HEENT: normal Cardio: RRR and no murmur Resp: CTA B/L and unlabored GI: BS positive and non distended Extremity:  No Edema Skin:   Wound R BK stump medial eschar loosening, no erythema tried bloody drainage on dressing, left distal foot status post digits 2 through 5 amputation. Small open area at midpoint distal no drainage no odor Neuro: Alert/Oriented, Anxious, Abnormal Motor 3-/5 R delt , bi, tri, grip, 4/5 R HF, 5/5 in L delt bi tri grip,4/5 L HF knee ext and 3- ankle DF and Tone:  Within Normal Limits Musc/Skel:  Extremity tender R stump limb guard Gen NAD   Assessment/Plan: 1. Functional deficits secondary to R BKA and deconditioning which require 3+ hours per day of interdisciplinary therapy in a comprehensive inpatient rehab setting. Physiatrist is providing close team supervision and 24 hour management of active medical problems listed below. Physiatrist and rehab team continue to assess barriers to discharge/monitor patient progress toward functional and medical goals. Team conference today please see physician documentation under team conference tab, met with team face-to-face to discuss problems,progress, and goals. Formulized individual treatment plan based on medical history, underlying problem and comorbidities. Should be ready for discharge in the morning. Recheck hemoglobin. Will need orthopedic followup FIM: FIM - Bathing Bathing Steps Patient Completed: Chest;Left lower leg (including foot);Right Arm;Left Arm;Abdomen;Front perineal area;Right upper leg;Left upper leg;Buttocks Bathing: 5: Set-up assist to: Obtain items  FIM - Upper Body Dressing/Undressing Upper body dressing/undressing steps patient completed: Thread/unthread  right sleeve of pullover shirt/dresss;Pull shirt  over trunk;Thread/unthread left sleeve of pullover shirt/dress;Put head through opening of pull over shirt/dress Upper body dressing/undressing: 5: Set-up assist to: Obtain clothing/put away FIM - Lower Body Dressing/Undressing Lower body dressing/undressing steps patient completed: Thread/unthread right pants leg;Thread/unthread left pants leg;Pull pants up/down;Don/Doff left sock Lower body dressing/undressing: 5: Set-up assist to: Obtain clothing  FIM - Toileting Toileting steps completed by patient: Adjust clothing after toileting;Adjust clothing prior to toileting Toileting: 3: Mod-Patient completed 2 of 3 steps  FIM - Archivist Transfers Assistive Devices: Bedside commode;Sliding board Toilet Transfers: 5-To toilet/BSC: Supervision (verbal cues/safety issues);5-From toilet/BSC: Supervision (verbal cues/safety issues)  FIM - Press photographer Assistive Devices: Arm rests Bed/Chair Transfer: 2: Bed > Chair or W/C: Max A (lift and lower assist);2: Chair or W/C > Bed: Max A (lift and lower assist)  FIM - Locomotion: Wheelchair Distance: 150 Locomotion: Wheelchair: 5: Travels 150 ft or more: maneuvers on rugs and over door sills with supervision, cueing or coaxing FIM - Locomotion: Ambulation Ambulation/Gait Assistance: Not tested (comment) Locomotion: Ambulation: 0: Activity did not occur  Comprehension Comprehension Mode: Auditory Comprehension: 6-Follows complex conversation/direction: With extra time/assistive device  Expression Expression Mode: Verbal Expression: 6-Expresses complex ideas: With extra time/assistive device  Social Interaction Social Interaction: 6-Interacts appropriately with others with medication or extra time (anti-anxiety, antidepressant).  Problem Solving Problem Solving: 6-Solves complex problems: With extra time  Memory Memory: 6-More than reasonable amt of time  Medical Problem List and Plan:  1. right BKA due  to  Charcot foot/abscess complicated by wound dehiscence after fall, prior left CVA with residual right-sided weakness, cont dressing changes 2. DVT Prophylaxis/Anticoagulation: SCDs left lower extremity  3. Pain Management: Presently Tylenol only. Will monitor with increased therapy  4. Mood: Xanax 0.25 mg twice a day as needed. Provide emotional support  5. Neuropsych: This patient is capable of making decisions on his own behalf.  6. Recent multi-toe amputation left foot February 2014. dressing changes followed by wound care nurse. Improving 7. ID. Sepsis/MSSA bacteremia. Ancef completed 8. CAD/NSTEMIx2. Continue aspirin therapy. Followup cardiothoracic surgery to consider CABG in the near future  9. Diastolic congestive heart failure. Oral Lasix therapy as directed for now. Cardiology services will continue to follow and adjust Lasix as needed. Check daily weights with I's and O's.  10. Diabetes mellitus peripheral neuropathy. Hemoglobin A1c 8.2 Levemir 23 units each bedtime. Check blood sugars a.c. and at bedtime  11. Hypertension. Hydralazine 25 mg every 6 hours, Coreg 25 mg twice a day. Monitor with increased mobility  12. Acute renal failure. Renal function improved with latest creatinine 1.27. Followup chemistries  13. Acute blood loss anemia. Latest hemoglobin 8.0 stable 14.  Urinary retention- add flomax, start taper of urecholine, d/c foley   LOS (Days) 12 A FACE TO FACE EVALUATION WAS PERFORMED  KIRSTEINS,ANDREW E 08/07/2013, 8:09 AM

## 2013-08-07 NOTE — Progress Notes (Signed)
Subjective:  No chest pain or dyspnea. Rhythm stable on bedside exam. Chest xray Friday showed improvement in CHF. Renal function improving. BP coming down. Tolerating ACEi.  No dizziness.  Objective:  Vital Signs in the last 24 hours: Temp:  [97.6 F (36.4 C)-98.4 F (36.9 C)] 98 F (36.7 C) (10/22 0515) Pulse Rate:  [88-115] 99 (10/22 0515) Resp:  [18-20] 18 (10/22 0511) BP: (106-144)/(68-94) 120/88 mmHg (10/22 0646) SpO2:  [98 %] 98 % (10/22 0511)  Intake/Output from previous day: 10/21 0701 - 10/22 0700 In: 840 [P.O.:840] Out: 2750 [Urine:2750] Intake/Output from this shift:    . aspirin  81 mg Oral Daily  . atorvastatin  40 mg Oral q1800  . bethanechol  10 mg Oral TID  . carvedilol  25 mg Oral BID WC  . famotidine  20 mg Oral BID  . furosemide  80 mg Oral BID  . hydrALAZINE  10 mg Oral Q6H  . insulin aspart  0-15 Units Subcutaneous TID WC  . insulin detemir  23 Units Subcutaneous QHS  . lisinopril  2.5 mg Oral Daily  . polyethylene glycol  17 g Oral Daily  . senna-docusate  1 tablet Oral BID  . tamsulosin  0.4 mg Oral QPC supper      Physical Exam: The patient appears to be in no distress.  Head and neck exam reveals that the pupils are equal and reactive.  The extraocular movements are full.  There is no scleral icterus.  Mouth and pharynx are benign.  No lymphadenopathy.  No carotid bruits.  The jugular venous pressure is normal.  Thyroid is not enlarged or tender.  Chest reveals no rales or wheezes. Decreased breath sounds at bases. Heart reveals no abnormal lift or heave.  First and second heart sounds are normal.  There is no murmur gallop rub or click.  The abdomen is soft and nontender.  Bowel sounds are normoactive.  There is no hepatosplenomegaly or mass.  There are no abdominal bruits.  Extremities reveal R BKA. 2+ edema left leg.  Neurologic exam is normal strength and no lateralizing weakness.  No sensory deficits.  Integument reveals no  rash  Lab Results: No results found for this basename: WBC, HGB, PLT,  in the last 72 hours  Recent Labs  08/05/13 0550 08/07/13 0550  NA 139 140  K 3.8 4.1  CL 99 101  CO2 30 30  GLUCOSE 179* 125*  BUN 40* 29*  CREATININE 1.27 1.27   No results found for this basename: TROPONINI, CK, MB,  in the last 72 hours Hepatic Function Panel No results found for this basename: PROT, ALBUMIN, AST, ALT, ALKPHOS, BILITOT, BILIDIR, IBILI,  in the last 72 hours No results found for this basename: CHOL,  in the last 72 hours No results found for this basename: PROTIME,  in the last 72 hours  Imaging: Imaging results have been reviewed.  Chest xray improved.  Cardiac Studies: None. Assessment/Plan:  1. Recent sepsis/MSSA bacteremia: (adm 9/9->9/17->CIR) from gangrenous R foot s/p R BKA 06/29/13, TEE negative for vegetation 07/03/13  2. CAD: 3-vessel CAD by cath 06/28/13. He will need CABG when he recovers from present illness. Continue beta blocker, ASA and statin.  3. Ischemic cardiomyopathy with acute systolic CHF: LVEF 40-45% by echo on 07/15/13. Moderate mitral regurgitation by TEE 07/03/13 .  Switch to oral lasix and follow renal function. Will start low dose ACE. Continue hydralazine for afterload reduction.  4. Acute hypoxic respiratory failure:  Improved  5. Acute renal failure Resolved. Follow renal function closely with diuresis. Renal function continues to improve. 6. Anemia: Follow. Right BKA stump still has bloody drainage according to patient.  Plan: Will continue ACE for his LV systolic dysfunction. Renal function stable. Anticipate possible discharge home in am  LOS: 12 days    Cassell Clement 08/07/2013, 7:40 AM

## 2013-08-07 NOTE — Progress Notes (Signed)
Occupational Therapy Discharge Summary  Patient Details  Name: Albert [name] MRN: [id number] Date of Birth: 1960/11/24  Today's Date: 08/07/2013 Time: [phone] and [phone]  Time Calculation (min): 45 min and 57 min   Patient has met 7 of 7 long term goals due to improved activity tolerance, improved balance, postural control, ability to compensate for deficits and improved awareness.  Patient to discharge at overall Supervision level and mod assist for toilet task for hygiene with BM.  Patient's care partner is independent to provide the necessary physical and cognitive assistance at discharge.  Patient's wife was schedule to attend family education/training session however did not attend with this OT. She did participate in family education with PT. This therapist left diagram to assist with positioning of BSC at home and written notes of when she needs to provide physical assist and supervision level assistance. Patient demonstrates ability to direct care.   Reasons goals not met: [name]. All LTGs met.   Recommendation:  Patient will benefit from ongoing skilled OT services in home health setting to continue to advance functional skills in the area of BADL and iADL.  Equipment: wide drop arm BSC  Reasons for discharge: treatment goals met  Patient/family agrees with progress made and goals achieved: Yes  Skilled Therapeutic Intervention: Session 1: Therapy session focused on activity tolerance, functional transfers, and lateral leans during ADL retraining. Pt received sitting EOB. Pt completed sponge bath and dressing while sitting EOB with setup assist. Pt used lateral lean technique for peri hygiene and managing clothing around waist. Used reacher to assist with LB dressing. Pt required 3 rest breaks during bathing and dressing secondary to fatigue. Pt transferred bed>w/c via sliding board with supervision. Pt placing and removing sliding board without assist. Completed grooming tasks  at sink at Mod I level. Pt reporting wife was coming earlier then scheduled family education sessions. OT discussed that family training was scheduled at 1300-1500 this afternoon and that will continue to be the planned time. Pt contacting wife to inform her of this however pt stated "she already knows what she needs to" and did not seem concerned about family education. Discussed importance of educating her and discussing safety for her and the patient. Provided examples of what therapist wanted to discuss with wife. Pt left sitting in w/c eating breakfast at end of session.   Session 2: Therapy session focused on toilet transfer, clothing management for toilet task, activity tolerance, and UE strengthening. Pt received sitting in w/c and wife not present. Wife had family training scheduled this PM however unable to attend. PT completed family education with her in AM. Pt practiced toilet transfer to [facility] using sliding board. Pt directed care throughout session as he would dictate correct placement of w/c, sliding board, BSC, etc. Therapist drew diagram of positioning of BSC at home and provided written notes for assisting pt. Pt completed transfers with supervision and managed clothing up/down. Pt unable to reach buttocks for hygiene and left note that wife would have to assist with this. Pt reported his wife is aware of this. Discussed reacher and pt reported wife was looking for one to purchase this afternoon. At end of session pt propelled self in w/c from room to therapy gym with increased time and 3 rest breaks. Pt handed over to PT for next session. Pt with no questions or concerns about discharge at this time.   OT Discharge Precautions/Restrictions    General   Vital Signs Therapy Vitals Temp: 98 F (36.7  C) Pulse Rate: 99 Resp: 18 BP: 120/88 mmHg Patient Position, if appropriate: Sitting Oxygen Therapy SpO2: 98 % O2 Device: None (Room air) Pain  No c/o pain during therapy session.   ADL   Vision/Perception     Cognition   Sensation   Motor    Mobility     Trunk/Postural Assessment     Balance   Extremity/Trunk Assessment      See FIM for current functional status  Albert [name] N 08/07/2013, 8:25 AM

## 2013-08-08 LAB — BASIC METABOLIC PANEL
BUN: 28 mg/dL — ABNORMAL HIGH (ref 6–23)
Creatinine, Ser: 1.2 mg/dL (ref 0.50–1.35)
GFR calc Af Amer: 80 mL/min — ABNORMAL LOW (ref >90)
GFR calc non Af Amer: 69 mL/min — ABNORMAL LOW (ref >90)
Glucose, Bld: 118 mg/dL — ABNORMAL HIGH (ref 70–99)
Potassium: 3.6 mEq/L (ref 3.5–5.1)

## 2013-08-08 LAB — GLUCOSE, CAPILLARY
Glucose-Capillary: 94 mg/dL (ref 70–99)
Glucose-Capillary: 148 mg/dL — ABNORMAL HIGH (ref 70–99)

## 2013-08-08 MED ORDER — PANTOPRAZOLE SODIUM 40 MG PO TBEC: 40.0000 mg | DELAYED_RELEASE_TABLET | Freq: Every day | ORAL | Status: DC

## 2013-08-08 MED ORDER — ASPIRIN EC 81 MG PO TBEC: 81.0000 mg | DELAYED_RELEASE_TABLET | Freq: Every day | ORAL | Status: DC

## 2013-08-08 MED ORDER — INSULIN DETEMIR 100 UNIT/ML ~~LOC~~ SOLN: 23.0000 [IU] | Freq: Every day | SUBCUTANEOUS | Status: DC

## 2013-08-08 MED ORDER — OXYCODONE HCL 10 MG PO TABS: 10.0000 mg | ORAL_TABLET | ORAL | Status: DC | PRN

## 2013-08-08 MED ORDER — SENNOSIDES-DOCUSATE SODIUM 8.6-50 MG PO TABS: 1.0000 | ORAL_TABLET | Freq: Two times a day (BID) | ORAL | Status: DC

## 2013-08-08 MED ORDER — HYDRALAZINE HCL 10 MG PO TABS: 10.0000 mg | ORAL_TABLET | Freq: Four times a day (QID) | ORAL | Status: DC

## 2013-08-08 MED ORDER — SIMVASTATIN 40 MG PO TABS: 40.0000 mg | ORAL_TABLET | Freq: Every evening | ORAL | Status: DC

## 2013-08-08 MED ORDER — TAMSULOSIN HCL 0.4 MG PO CAPS: 0.4000 mg | ORAL_CAPSULE | Freq: Every day | ORAL | Status: DC

## 2013-08-08 MED ORDER — FUROSEMIDE 80 MG PO TABS: 80.0000 mg | ORAL_TABLET | Freq: Two times a day (BID) | ORAL | Status: DC

## 2013-08-08 MED ORDER — ALPRAZOLAM 0.25 MG PO TABS: 0.2500 mg | ORAL_TABLET | Freq: Two times a day (BID) | ORAL | Status: DC | PRN

## 2013-08-08 MED ORDER — LISINOPRIL 5 MG PO TABS: 5.0000 mg | ORAL_TABLET | Freq: Every day | ORAL | Status: DC

## 2013-08-08 MED ORDER — CARVEDILOL 25 MG PO TABS: 25.0000 mg | ORAL_TABLET | Freq: Two times a day (BID) | ORAL | Status: DC

## 2013-08-08 NOTE — Progress Notes (Addendum)
52 year old patient admitted to rehabilitation following a right BKA. Medical problems include hypertension and diabetes as well his peripheral vascular disease. Hospital course complicated by acute kidney injury and NSTEMI. He has a history of coronary artery disease and CABG is being considered post recovery for his triple-vessel disease.  Subjective/Complaints: Appreciate cardiology notes, ortho saw pt yest as well   Objective:  Vital Signs: Blood pressure 138/93, pulse 89, temperature 98.7 F (37.1 C), temperature source Oral, resp. rate 17, weight 118.616 kg (261 lb 8 oz), SpO2 97.00%. No results found. Results for orders placed during the hospital encounter of 07/26/13 (from the past 72 hour(s))  GLUCOSE, CAPILLARY     Status: Abnormal   Collection Time    08/05/13 11:51 AM      Result Value Range   Glucose-Capillary 112 (*) 70 - 99 mg/dL  GLUCOSE, CAPILLARY     Status: Abnormal   Collection Time    08/05/13  4:38 PM      Result Value Range   Glucose-Capillary 192 (*) 70 - 99 mg/dL  GLUCOSE, CAPILLARY     Status: Abnormal   Collection Time    08/05/13  9:18 PM      Result Value Range   Glucose-Capillary 218 (*) 70 - 99 mg/dL   Comment 1 Notify RN    GLUCOSE, CAPILLARY     Status: Abnormal   Collection Time    08/06/13  7:13 AM      Result Value Range   Glucose-Capillary 153 (*) 70 - 99 mg/dL   Comment 1 Notify RN    GLUCOSE, CAPILLARY     Status: None   Collection Time    08/06/13 11:20 AM      Result Value Range   Glucose-Capillary 94  70 - 99 mg/dL   Comment 1 Notify RN    GLUCOSE, CAPILLARY     Status: Abnormal   Collection Time    08/06/13  4:45 PM      Result Value Range   Glucose-Capillary 105 (*) 70 - 99 mg/dL   Comment 1 Notify RN    GLUCOSE, CAPILLARY     Status: Abnormal   Collection Time    08/06/13  9:09 PM      Result Value Range   Glucose-Capillary 163 (*) 70 - 99 mg/dL  BASIC METABOLIC PANEL     Status: Abnormal   Collection Time    08/07/13   5:50 AM      Result Value Range   Sodium 140  135 - 145 mEq/L   Potassium 4.1  3.5 - 5.1 mEq/L   Chloride 101  96 - 112 mEq/L   CO2 30  19 - 32 mEq/L   Glucose, Bld 125 (*) 70 - 99 mg/dL   BUN 29 (*) 6 - 23 mg/dL   Creatinine, Ser 1.02  0.50 - 1.35 mg/dL   Calcium 8.5  8.4 - 72.5 mg/dL   GFR calc non Af Amer 64 (*) >90 mL/min   GFR calc Af Amer 75 (*) >90 mL/min   Comment: (NOTE)     The eGFR has been calculated using the CKD EPI equation.     This calculation has not been validated in all clinical situations.     eGFR's persistently <90 mL/min signify possible Chronic Kidney     Disease.  GLUCOSE, CAPILLARY     Status: Abnormal   Collection Time    08/07/13  7:15 AM      Result Value Range  Glucose-Capillary 113 (*) 70 - 99 mg/dL   Comment 1 Notify RN    GLUCOSE, CAPILLARY     Status: Abnormal   Collection Time    08/07/13 11:13 AM      Result Value Range   Glucose-Capillary 122 (*) 70 - 99 mg/dL   Comment 1 Notify RN    GLUCOSE, CAPILLARY     Status: Abnormal   Collection Time    08/07/13  5:02 PM      Result Value Range   Glucose-Capillary 101 (*) 70 - 99 mg/dL   Comment 1 Notify RN    GLUCOSE, CAPILLARY     Status: Abnormal   Collection Time    08/07/13  7:57 PM      Result Value Range   Glucose-Capillary 145 (*) 70 - 99 mg/dL  BASIC METABOLIC PANEL     Status: Abnormal   Collection Time    08/08/13  5:15 AM      Result Value Range   Sodium 138  135 - 145 mEq/L   Potassium 3.6  3.5 - 5.1 mEq/L   Chloride 101  96 - 112 mEq/L   CO2 27  19 - 32 mEq/L   Glucose, Bld 118 (*) 70 - 99 mg/dL   BUN 28 (*) 6 - 23 mg/dL   Creatinine, Ser 1.61  0.50 - 1.35 mg/dL   Calcium 8.4  8.4 - 09.6 mg/dL   GFR calc non Af Amer 69 (*) >90 mL/min   GFR calc Af Amer 80 (*) >90 mL/min   Comment: (NOTE)     The eGFR has been calculated using the CKD EPI equation.     This calculation has not been validated in all clinical situations.     eGFR's persistently <90 mL/min signify  possible Chronic Kidney     Disease.  GLUCOSE, CAPILLARY     Status: None   Collection Time    08/08/13  7:14 AM      Result Value Range   Glucose-Capillary 94  70 - 99 mg/dL   Comment 1 Notify RN       HEENT: normal Cardio: RRR and no murmur Resp: CTA B/L and unlabored GI: BS positive and non distended Extremity:  No Edema Skin:   Wound R BK stump medial eschar loosening, no erythema tried bloody drainage on dressing, left distal foot status post digits 2 through 5 amputation. Small open area at midpoint distal no drainage no odor Neuro: Alert/Oriented, Anxious, Abnormal Motor 3-/5 R delt , bi, tri, grip, 4/5 R HF, 5/5 in L delt bi tri grip,4/5 L HF knee ext and 3- ankle DF and Tone:  Within Normal Limits Musc/Skel:  Extremity tender R stump limb guard Gen NAD   Assessment/Plan: 1. Functional deficits secondary to R BKA and deconditioning which require 3+ hours per day of interdisciplinary therapy in a comprehensive inpatient rehab setting. Physiatrist is providing close team supervision and 24 hour management of active medical problems listed below. Physiatrist and rehab team continue to assess barriers to discharge/monitor patient progress toward functional and medical goals. Stable for D/C today F/u PCP in 1-2 weeks F/u PM&R 3 weeks See D/C summary See D/C instructions FIM: FIM - Bathing Bathing Steps Patient Completed: Chest;Left lower leg (including foot);Right Arm;Left Arm;Abdomen;Front perineal area;Right upper leg;Left upper leg;Buttocks Bathing: 5: Set-up assist to: Obtain items  FIM - Upper Body Dressing/Undressing Upper body dressing/undressing steps patient completed: Thread/unthread right sleeve of pullover shirt/dresss;Pull shirt over trunk;Thread/unthread left sleeve of  pullover shirt/dress;Put head through opening of pull over shirt/dress Upper body dressing/undressing: 5: Set-up assist to: Obtain clothing/put away FIM - Lower Body Dressing/Undressing Lower  body dressing/undressing steps patient completed: Thread/unthread right pants leg;Thread/unthread left pants leg;Pull pants up/down;Don/Doff left sock Lower body dressing/undressing: 5: Set-up assist to: Don/Doff AFO/prosthesis/orthosis  FIM - Toileting Toileting steps completed by patient: Adjust clothing after toileting;Adjust clothing prior to toileting Toileting: 3: Mod-Patient completed 2 of 3 steps  FIM - Archivist Transfers Assistive Devices: Bedside commode;Sliding board Toilet Transfers: 5-To toilet/BSC: Supervision (verbal cues/safety issues);5-From toilet/BSC: Supervision (verbal cues/safety issues)  FIM - Banker Devices: Sliding board;Arm rests Bed/Chair Transfer: 6: Supine > Sit: No assist;6: Sit > Supine: No assist;5: Bed > Chair or W/C: Supervision (verbal cues/safety issues);5: Chair or W/C > Bed: Supervision (verbal cues/safety issues)  FIM - Locomotion: Wheelchair Distance: 50 Locomotion: Wheelchair: 2: Travels 50 - 149 ft with supervision, cueing or coaxing FIM - Locomotion: Ambulation Ambulation/Gait Assistance: Not tested (comment) Locomotion: Ambulation: 0: Activity did not occur  Comprehension Comprehension Mode: Auditory Comprehension: 6-Follows complex conversation/direction: With extra time/assistive device  Expression Expression Mode: Verbal Expression: 6-Expresses complex ideas: With extra time/assistive device  Social Interaction Social Interaction: 6-Interacts appropriately with others with medication or extra time (anti-anxiety, antidepressant).  Problem Solving Problem Solving: 6-Solves complex problems: With extra time  Memory Memory: 6-More than reasonable amt of time  Medical Problem List and Plan:  1. right BKA due to  Charcot foot/abscess complicated by wound dehiscence after fall, prior left CVA with residual right-sided weakness, cont dressing changes 2. DVT  Prophylaxis/Anticoagulation: SCDs left lower extremity  3. Pain Management: Presently Tylenol only. Will monitor with increased therapy  4. Mood: Xanax 0.25 mg twice a day as needed. Provide emotional support  5. Neuropsych: This patient is capable of making decisions on his own behalf.  6. Recent multi-toe amputation left foot February 2014. dressing changes followed by wound care nurse. Improving 7. ID. Sepsis/MSSA bacteremia. Ancef completed 8. CAD/NSTEMIx2. Continue aspirin therapy. Followup cardiothoracic surgery to consider CABG in the near future  9. Diastolic congestive heart failure. Oral Lasix therapy as directed for now. Cardiology services will continue to follow and adjust Lasix as needed. Check daily weights with I's and O's.  10. Diabetes mellitus peripheral neuropathy. Hemoglobin A1c 8.2 Levemir 23 units each bedtime. Check blood sugars a.c. and at bedtime  11. Hypertension. Hydralazine 25 mg every 6 hours, Coreg 25 mg twice a day. Monitor with increased mobility  12. Acute renal failure. Renal function improved with latest creatinine 1.27. Followup chemistries  13. Acute blood loss anemia. Latest hemoglobin 8.0 stable 14.  Urinary retention- resolved   LOS (Days) 13 A FACE TO FACE EVALUATION WAS PERFORMED  Albert [name] 08/08/2013, 8:37 AM

## 2013-08-08 NOTE — Progress Notes (Signed)
Pt discharged to home with wife.

## 2013-08-08 NOTE — Progress Notes (Signed)
Social Work Discharge Note Discharge Note  The overall goal for the admission was met for:   Discharge location: Yes-HOME WITH WIFE AND SON'S-24 HR CARE  Length of Stay: Yes-13 DAYS  Discharge activity level: Yes-MOD/I WHEELCHAIR LEVEL   Home/community participation: Yes  Services provided included: MD, RD, PT, OT, RN, Pharmacy and SW  Financial Services: Private Insurance: [facility]  Follow-up services arranged: Home Health: [facility]-PT, OT, RN, DME: [facility]-WHEELCHAIR, DROP-ARM BSC, TUB BENCH 30 TRANSFER BOARD and Patient/Family has no preference for HH/DME agencies  Comments (or additional information):FAMILY EDUCATION COMPLETED AND BOTH FEEL COMFORTABLE WITH HIS CARE-FAMILY TO PROVIDE 24 HR CARE  Patient/Family verbalized understanding of follow-up arrangements: Yes  Individual responsible for coordination of the follow-up plan: SELF & [name]-WIFE  Confirmed correct DME delivered: Lucy Chris 08/08/2013    Lucy Chris

## 2013-08-08 NOTE — Discharge Summary (Signed)
NAMEIMRAN, NUON NO.:  [id number]  MEDICAL RECORD NO.:  [id number]  LOCATION:  [id number]                        FACILITY:  [facility]  PHYSICIAN:  Erick Colace, M.D.DATE OF BIRTH:  11/10/1960  DATE OF ADMISSION:  07/26/2013 DATE OF DISCHARGE:  08/08/2013                              DISCHARGE SUMMARY   DISCHARGE DIAGNOSES: 1. 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 2. Sequential compression device for deep vein thrombosis prophylaxis. 3. Pain management. 4. Anxiety. 5. Recent multi toe amputation left foot February 2014. 6. Coronary artery disease-non ST-elevation myocardial infarction x2. 7. Diastolic congestive heart failure. 8. Diabetes mellitus peripheral neuropathy. 9. Hypertension. 10.Acute renal failure. 11.Acute blood loss anemia. 12.Urinary retention.  HISTORY OF PRESENT ILLNESS:  This is a 52 year old right-handed male multimedical history of diabetes mellitus, cerebrovascular accident, right-sided residual weakness, Charcot right lower extremity,  multiple amputations of toes left foot February 2014, admitted June 25, 2013, with nonhealing ulcer to the right foot and Charcot collapse right foot. Underwent irrigation and debridement of right foot plantar abscess June 25, 2013, per Dr. Magnus Ivan.  Postoperative hypotension and tachycardia.  Cardiology Service is consulted and noted elevated troponin levels of 10.  Echocardiogram with distal septal hypokinesis with overall low normal left ventricular ejection fraction.  Elevated troponin level likely related to sepsis.  Underwent cardiac catheterization.  Findings of triple-vessel coronary artery disease-N- STEMI secondary to demand ischemia.  The patient placed on medical management aspirin therapy.  Ongoing ischemic changes right  lower extremity.  He underwent right below-knee amputation on June 29, 2013, per Dr. Lajoyce Corners.  Cardiothoracic Surgery follow up to consider coronary artery bypass grafting after recovery from below-knee amputation.  TEE completed July 03, 2013, to rule out vegetation that was negative with ejection fraction 50%.  The patient remained on antibiotic therapy for sepsis, MSSA bacteremia for 28 day course.  Mr. Crumble was participating with therapies.  He was admitted to inpatient rehab services on July 03, 2013.  On July 11, 2013, noted increased shortness of breath, as well as weight gain of 10 pounds.  He received intravenous Lasix with improvement in shortness of breath.  He was maintained on antibiotic therapy.  Developed hypotension, received intravenous bolus.  The patient with a fall transferring from bed to commode noted bleeding from right below-knee amputation site requiring multiple dressing changes.  Follow up Orthopedic Services.  No further surgical intervention required by continued dressing changes and monitored.  Due to ongoing bouts of pulmonary edema as well as recent wound dehiscence, the patient was discharged to acute care services on July 14, 2013.  EKG shows sinus tachycardia and nonspecific ST changes, follow up Cardiology Services.  Follow up echocardiogram unchanged.  Elevated creatinine level 1.78-2.2 and monitored while on diuretic therapy.  The patient was readmitted back to rehab services for ongoing therapy.  PAST MEDICAL HISTORY:  See discharge diagnoses.  SOCIAL HISTORY:  Lives with his wife.  FUNCTIONAL HISTORY:  Prior to admission independent.  FUNCTIONAL STATUS:  Upon admission to rehab services +2total assist.  PHYSICAL EXAMINATION:  VITAL SIGNS:  Blood pressure 113/70, pulse 106, temperature 98, respirations 18. GENERAL:  This was an alert male, oriented x3. HEENT:  Pupils were round and reactive to light. LUNGS:  Clear to  auscultation. CARDIAC:  Normal rate, no murmur or gallop. ABDOMEN:  Soft, nontender.  Good bowel sounds. EXTREMITIES:  Below knee amputation site was dressed with substantial serosanguineous drainage as well as left foot dressing, seeping of drainage from recent toe amputations.  REHABILITATION HOSPITAL COURSE:  The patient was admitted to Inpatient Rehab Services with therapies initiated on a 3-hour daily basis consisting of physical therapy, occupational therapy, and 24-hour rehabilitation nursing.  The following issues were addressed during the patient's rehabilitation stay.  Pertaining to Mr. Pesta right below- knee amputation secondary to Charcot foot abscess complicated by wound dehiscence after a fall followed by Orthopedic Service, Dr. Lajoyce Corners.  His drainage had improved greatly.  He was now off all antibiotic therapy. It was advised per Orthopedic Services to follow up in the office.  The patient also with history of recent multiple toe amputations left foot. Again Dr. Lajoyce Corners would follow up in regard to this.  Pain management with the use of Tylenol is monitored.  He had no further bouts of chest pain or shortness of breath, however, with his diastolic congestive heart failure follow up remained with Cardiology Services.  He had been on intravenous Lasix had since been changed to 80 mg p.o. b.i.d. tolerating nicely.  He exhibited no signs of fluid overload.  He continued on his insulin therapy for diabetes mellitus, peripheral neuropathy.  Latest hemoglobin A1c of 8.2.  Blood pressure currently monitored closely as he remained on Coreg, his Lasix therapy as well as hydralazine with lisinopril.  He would follow up as noted as an outpatient.  Acute renal failure monitored closely while receiving diuresis.  Latest creatinine 1.27 much improved from 1.74.  Would follow up outpatient chemistries. The patient received weekly collaborative interdisciplinary team conferences to discuss  estimated length of stay, family teaching, any barriers to discharge.  He was modified independent to propel his wheelchair.  He can perform sit to stand in the parallel bars, pulling up and standing max assist achieving stand, minimal assist for static standing.  He still needed assistance for lower body activities of daily living.  Full family teaching was completed with his wife with recommendations of ongoing therapies at home as well as nursing care.  DISCHARGE MEDICATIONS: 1. Xanax 0.25 mg p.o. b.i.d. as needed. 2. Aspirin 81 mg p.o. daily. 3. Lipitor 40 mg p.o. daily. 4. Coreg 25 mg p.o. b.i.d. 5. Pepcid 20 mg p.o. b.i.d. 6. Lasix 80 mg p.o. b.i.d. 7. Hydralazine 10 mg p.o. every 6 hours. 8. Levemir 23 units subcutaneous at bedtime. 9. Lisinopril 5 mg p.o. daily. 10.Oxycodone immediate release 10 mg every 4 hours as needed severe     pain. 11.Flomax 0.4 mg p.o. at bedtime.  DIET:  Diabetic diet.  SPECIAL INSTRUCTIONS:  Dry dressing to below-knee amputation site change daily as needed.  Try dressing left foot as directed.  Special instructions, the patient would follow up Dr. Dorris Fetch of Cardiothoracic Surgery outpatient to discuss future coronary artery bypass grafting after recovery from below-knee amputation.  Follow up Dr. Claudette Laws at the outpatient rehab center as needed, Dr. Rollene Rotunda, Cardiology Services 2 weeks call for appointment, Dr. Aldean Baker, Orthopedic Services 2 weeks appointment to be made.     Mariam Dollar, P.A.   ______________________________ Victorino Sparrow.  Kirsteins, M.D.    DA/MEDQ  D:  08/07/2013  T:  08/08/2013  Job:  [id number]  cc:   Nadara Mustard, MD Rollene Rotunda, MD, [facility] Salvatore Decent. Dorris Fetch, M.D.

## 2013-08-08 NOTE — Progress Notes (Signed)
Subjective:  No chest pain or dyspnea. Rhythm stable on bedside exam. Renal function remaining stable on addition of ACEi.  Objective:  Vital Signs in the last 24 hours: Temp:  [98.1 F (36.7 C)-98.7 F (37.1 C)] 98.7 F (37.1 C) (10/23 0511) Pulse Rate:  [89-100] 89 (10/23 0733) Resp:  [16-17] 17 (10/23 0511) BP: (125-138)/(77-97) 138/93 mmHg (10/23 0733) SpO2:  [96 %-97 %] 97 % (10/23 0511)  Intake/Output from previous day: 10/22 0701 - 10/23 0700 In: 1080 [P.O.:1080] Out: 1150 [Urine:1150] Intake/Output from this shift:    . aspirin  81 mg Oral Daily  . atorvastatin  40 mg Oral q1800  . carvedilol  25 mg Oral BID WC  . collagenase   Topical Daily  . famotidine  20 mg Oral BID  . furosemide  80 mg Oral BID  . hydrALAZINE  10 mg Oral Q6H  . insulin aspart  0-15 Units Subcutaneous TID WC  . insulin detemir  23 Units Subcutaneous QHS  . lisinopril  5 mg Oral Daily  . polyethylene glycol  17 g Oral Daily  . senna-docusate  1 tablet Oral BID  . tamsulosin  0.4 mg Oral QPC supper      Physical Exam: The patient appears to be in no distress.  Head and neck exam reveals that the pupils are equal and reactive.  The extraocular movements are full.  There is no scleral icterus.  Mouth and pharynx are benign.  No lymphadenopathy.  No carotid bruits.  The jugular venous pressure is normal.  Thyroid is not enlarged or tender.  Chest reveals no rales or wheezes. Decreased breath sounds at bases. Heart reveals no abnormal lift or heave.  First and second heart sounds are normal.  There is no murmur gallop rub or click.  The abdomen is soft and nontender.  Bowel sounds are normoactive.  There is no hepatosplenomegaly or mass.  There are no abdominal bruits.  Extremities reveal R BKA. 2+ edema left leg.  Neurologic exam is normal strength and no lateralizing weakness.  No sensory deficits.  Integument reveals no rash  Lab Results: No results found for this basename: WBC,  HGB, PLT,  in the last 72 hours  Recent Labs  08/07/13 0550 08/08/13 0515  NA 140 138  K 4.1 3.6  CL 101 101  CO2 30 27  GLUCOSE 125* 118*  BUN 29* 28*  CREATININE 1.27 1.20   No results found for this basename: TROPONINI, CK, MB,  in the last 72 hours Hepatic Function Panel No results found for this basename: PROT, ALBUMIN, AST, ALT, ALKPHOS, BILITOT, BILIDIR, IBILI,  in the last 72 hours No results found for this basename: CHOL,  in the last 72 hours No results found for this basename: PROTIME,  in the last 72 hours  Imaging: Imaging results have been reviewed.  Chest xray improved.  Cardiac Studies: None. Assessment/Plan:  1. Recent sepsis/MSSA bacteremia: (adm 9/9->9/17->CIR) from gangrenous R foot s/p R BKA 06/29/13, TEE negative for vegetation 07/03/13  2. CAD: 3-vessel CAD by cath 06/28/13. He will need CABG when he recovers from present illness. Continue beta blocker, ASA and statin.  3. Ischemic cardiomyopathy with acute systolic CHF: LVEF 40-45% by echo on 07/15/13. Moderate mitral regurgitation by TEE 07/03/13 .  Switch to oral lasix and follow renal function. Will start low dose ACE. Continue hydralazine for afterload reduction.  4. Acute hypoxic respiratory failure: Improved  5. Acute renal failure Resolved. Follow renal function  closely with diuresis. Renal function continues to improve. 6. Anemia: Follow. Right BKA stump still has bloody drainage according to patient.  Plan: Okay for discharge today on current cardiac regimen. Followup with Dr. Antoine Poche in several weeks. Lisinopril can likely be titrated higher as outpatient.   LOS: 13 days    Albert [name] 08/08/2013, 7:48 AM

## 2013-08-08 NOTE — Progress Notes (Signed)
I have reviewed and agree with the treatment as reflected in this note. Albert [name], PT DPT  

## 2013-08-13 ENCOUNTER — Telehealth: Payer: Self-pay | Admitting: *Deleted

## 2013-08-13 NOTE — Telephone Encounter (Signed)
Just FYI Albert [name] fell out of his wheelchair onto his residual limb. Not injured. Did have some bleeding but otherwise no change. He had no other injuries.  HHRN thinks it needs to have some additional debridement--sees Dr Lajoyce Corners on Thursday

## 2013-08-14 NOTE — Telephone Encounter (Signed)
Make sure pt is wearing limb guard

## 2013-08-15 ENCOUNTER — Other Ambulatory Visit (HOSPITAL_COMMUNITY): Payer: Self-pay | Admitting: Orthopedic Surgery

## 2013-08-15 NOTE — Progress Notes (Signed)
Dr. Michelle Piper made aware of pt abnormal EKG, CXR, and echo. MD advised that I "put all of the studies on the front of the chart."  Anesthesia to review EKG, chest x ray, and cardiac studies on the day of surgery.

## 2013-08-15 NOTE — Telephone Encounter (Signed)
Notified Debbie PT and she said the [facility] says when he stands up if falls off, but he is seeing Dr Lajoyce Corners today and hopefully they will address it.

## 2013-08-15 NOTE — Progress Notes (Signed)
Several attempts were made to contact pt to complete pre-op assessment. Pre-op instructions (per pre-op check list) were left on pt voicemail.

## 2013-08-16 ENCOUNTER — Encounter (HOSPITAL_COMMUNITY): Payer: Self-pay | Admitting: *Deleted

## 2013-08-16 ENCOUNTER — Encounter (HOSPITAL_COMMUNITY)
Admission: RE | Disposition: A | Discharge status: Home / Self Care | Payer: Self-pay | Source: Ambulatory Visit | Attending: Orthopedic Surgery

## 2013-08-16 ENCOUNTER — Observation Stay (HOSPITAL_COMMUNITY)
Admission: RE | Admit: 2013-08-16 | Discharge: 2013-08-17 | Disposition: A | Discharge status: Home / Self Care | Payer: 59 | Source: Ambulatory Visit | Attending: Orthopedic Surgery | Admitting: Orthopedic Surgery

## 2013-08-16 ENCOUNTER — Ambulatory Visit (HOSPITAL_COMMUNITY): Payer: 59 | Admitting: Anesthesiology

## 2013-08-16 ENCOUNTER — Encounter (HOSPITAL_COMMUNITY): Payer: 59 | Admitting: Anesthesiology

## 2013-08-16 DIAGNOSIS — N289 Disorder of kidney and ureter, unspecified: Secondary | ICD-10-CM | POA: Insufficient documentation

## 2013-08-16 DIAGNOSIS — T8740 Infection of amputation stump, unspecified extremity: Principal | ICD-10-CM | POA: Insufficient documentation

## 2013-08-16 DIAGNOSIS — I739 Peripheral vascular disease, unspecified: Secondary | ICD-10-CM | POA: Insufficient documentation

## 2013-08-16 DIAGNOSIS — Z89511 Acquired absence of right leg below knee: Secondary | ICD-10-CM

## 2013-08-16 DIAGNOSIS — I252 Old myocardial infarction: Secondary | ICD-10-CM | POA: Insufficient documentation

## 2013-08-16 DIAGNOSIS — I2510 Atherosclerotic heart disease of native coronary artery without angina pectoris: Secondary | ICD-10-CM | POA: Insufficient documentation

## 2013-08-16 DIAGNOSIS — E1165 Type 2 diabetes mellitus with hyperglycemia: Secondary | ICD-10-CM

## 2013-08-16 DIAGNOSIS — E1149 Type 2 diabetes mellitus with other diabetic neurological complication: Secondary | ICD-10-CM | POA: Insufficient documentation

## 2013-08-16 DIAGNOSIS — I96 Gangrene, not elsewhere classified: Secondary | ICD-10-CM

## 2013-08-16 DIAGNOSIS — Y835 Amputation of limb(s) as the cause of abnormal reaction of the patient, or of later complication, without mention of misadventure at the time of the procedure: Secondary | ICD-10-CM | POA: Insufficient documentation

## 2013-08-16 DIAGNOSIS — I10 Essential (primary) hypertension: Secondary | ICD-10-CM | POA: Insufficient documentation

## 2013-08-16 DIAGNOSIS — E1142 Type 2 diabetes mellitus with diabetic polyneuropathy: Secondary | ICD-10-CM | POA: Insufficient documentation

## 2013-08-16 DIAGNOSIS — I509 Heart failure, unspecified: Secondary | ICD-10-CM | POA: Insufficient documentation

## 2013-08-16 HISTORY — PX: STUMP REVISION: SHX6102

## 2013-08-16 LAB — COMPREHENSIVE METABOLIC PANEL
AST: 11 U/L (ref 0–37)
Albumin: 3 g/dL — ABNORMAL LOW (ref 3.5–5.2)
Alkaline Phosphatase: 63 U/L (ref 39–117)
BUN: 26 mg/dL — ABNORMAL HIGH (ref 6–23)
Creatinine, Ser: 1.3 mg/dL (ref 0.50–1.35)
GFR calc Af Amer: 73 mL/min — ABNORMAL LOW (ref >90)
Potassium: 3.9 mEq/L (ref 3.5–5.1)
Total Protein: 6.6 g/dL (ref 6.0–8.3)

## 2013-08-16 LAB — CBC
HCT: 25.6 % — ABNORMAL LOW (ref 39.0–52.0)
MCH: 23.5 pg — ABNORMAL LOW (ref 26.0–34.0)
MCHC: 30.5 g/dL (ref 30.0–36.0)
MCV: 77.1 fL — ABNORMAL LOW (ref 78.0–100.0)
Platelets: 217 10*3/uL (ref 150–400)
RDW: 16.4 % — ABNORMAL HIGH (ref 11.5–15.5)
WBC: 11 10*3/uL — ABNORMAL HIGH (ref 4.0–10.5)

## 2013-08-16 LAB — PROTIME-INR
INR: 1.2 (ref 0.00–1.49)
Prothrombin Time: 14.9 seconds (ref 11.6–15.2)

## 2013-08-16 LAB — GLUCOSE, CAPILLARY
Glucose-Capillary: 145 mg/dL — ABNORMAL HIGH (ref 70–99)
Glucose-Capillary: 106 mg/dL — ABNORMAL HIGH (ref 70–99)

## 2013-08-16 LAB — APTT: aPTT: 33 seconds (ref 24–37)

## 2013-08-16 SURGERY — REVISION, AMPUTATION SITE
Anesthesia: General | Site: Leg Lower | Laterality: Right | Wound class: Dirty or Infected

## 2013-08-16 MED ORDER — POLYETHYLENE GLYCOL 3350 17 G PO PACK
17.0000 g | PACK | Freq: Every day | ORAL | Status: DC
  Administered 2013-08-16 – 2013-08-17 (×2): 17 g via ORAL
  Filled 2013-08-16 (×2): qty 1

## 2013-08-16 MED ORDER — CEFAZOLIN SODIUM-DEXTROSE 2-3 GM-% IV SOLR
2.0000 g | Freq: Four times a day (QID) | INTRAVENOUS | Status: AC
  Administered 2013-08-16 – 2013-08-17 (×3): 2 g via INTRAVENOUS
  Filled 2013-08-16 (×3): qty 50

## 2013-08-16 MED ORDER — METOCLOPRAMIDE HCL 5 MG/ML IJ SOLN: 5.0000 mg | Freq: Three times a day (TID) | INTRAMUSCULAR | Status: DC | PRN

## 2013-08-16 MED ORDER — LISINOPRIL 5 MG PO TABS
5.0000 mg | ORAL_TABLET | Freq: Every day | ORAL | Status: DC
  Filled 2013-08-16 (×2): qty 1

## 2013-08-16 MED ORDER — OXYCODONE HCL 5 MG PO TABS
5.0000 mg | ORAL_TABLET | ORAL | Status: DC | PRN
  Administered 2013-08-16 – 2013-08-17 (×6): 10 mg via ORAL
  Filled 2013-08-16 (×5): qty 2

## 2013-08-16 MED ORDER — PANTOPRAZOLE SODIUM 40 MG PO TBEC
40.0000 mg | DELAYED_RELEASE_TABLET | Freq: Every day | ORAL | Status: DC
  Administered 2013-08-17: 40 mg via ORAL
  Filled 2013-08-16: qty 1

## 2013-08-16 MED ORDER — HYDROMORPHONE HCL PF 1 MG/ML IJ SOLN
INTRAMUSCULAR | Status: AC
  Filled 2013-08-16: qty 1

## 2013-08-16 MED ORDER — INSULIN ASPART 100 UNIT/ML ~~LOC~~ SOLN
0.0000 [IU] | Freq: Three times a day (TID) | SUBCUTANEOUS | Status: DC
  Administered 2013-08-16 – 2013-08-17 (×2): 2 [IU] via SUBCUTANEOUS

## 2013-08-16 MED ORDER — FUROSEMIDE 80 MG PO TABS
80.0000 mg | ORAL_TABLET | Freq: Two times a day (BID) | ORAL | Status: DC
  Administered 2013-08-16 – 2013-08-17 (×2): 80 mg via ORAL
  Filled 2013-08-16 (×4): qty 1

## 2013-08-16 MED ORDER — CARVEDILOL 25 MG PO TABS
25.0000 mg | ORAL_TABLET | Freq: Two times a day (BID) | ORAL | Status: DC
  Administered 2013-08-17: 25 mg via ORAL
  Filled 2013-08-16 (×4): qty 1

## 2013-08-16 MED ORDER — SODIUM CHLORIDE 0.9 % IV SOLN
20.0000 mg | INTRAVENOUS | Status: DC | PRN
  Administered 2013-08-16: 50 ug/min via INTRAVENOUS

## 2013-08-16 MED ORDER — INSULIN ASPART 100 UNIT/ML ~~LOC~~ SOLN
4.0000 [IU] | Freq: Three times a day (TID) | SUBCUTANEOUS | Status: DC
  Administered 2013-08-16 – 2013-08-17 (×2): 4 [IU] via SUBCUTANEOUS

## 2013-08-16 MED ORDER — SIMVASTATIN 40 MG PO TABS
40.0000 mg | ORAL_TABLET | Freq: Every evening | ORAL | Status: DC
  Administered 2013-08-16: 40 mg via ORAL
  Filled 2013-08-16 (×2): qty 1

## 2013-08-16 MED ORDER — OXYCODONE HCL 5 MG PO TABS
ORAL_TABLET | ORAL | Status: AC
  Filled 2013-08-16: qty 2

## 2013-08-16 MED ORDER — EPHEDRINE SULFATE 50 MG/ML IJ SOLN
INTRAMUSCULAR | Status: DC | PRN
  Administered 2013-08-16: 15 mg via INTRAVENOUS

## 2013-08-16 MED ORDER — LACTATED RINGERS IV SOLN
INTRAVENOUS | Status: DC | PRN
  Administered 2013-08-16: 14:00:00 via INTRAVENOUS

## 2013-08-16 MED ORDER — INSULIN DETEMIR 100 UNIT/ML ~~LOC~~ SOLN
23.0000 [IU] | Freq: Every day | SUBCUTANEOUS | Status: DC
  Filled 2013-08-16 (×2): qty 0.23

## 2013-08-16 MED ORDER — ASPIRIN EC 81 MG PO TBEC
81.0000 mg | DELAYED_RELEASE_TABLET | Freq: Every day | ORAL | Status: DC
  Administered 2013-08-16 – 2013-08-17 (×2): 81 mg via ORAL
  Filled 2013-08-16 (×2): qty 1

## 2013-08-16 MED ORDER — METOCLOPRAMIDE HCL 10 MG PO TABS: 5.0000 mg | ORAL_TABLET | Freq: Three times a day (TID) | ORAL | Status: DC | PRN

## 2013-08-16 MED ORDER — FENTANYL CITRATE 0.05 MG/ML IJ SOLN
INTRAMUSCULAR | Status: DC | PRN
  Administered 2013-08-16: 50 ug via INTRAVENOUS

## 2013-08-16 MED ORDER — CLINDAMYCIN PHOSPHATE 900 MG/50ML IV SOLN
900.0000 mg | INTRAVENOUS | Status: AC
  Administered 2013-08-16: 600 mg via INTRAVENOUS
  Filled 2013-08-16: qty 50

## 2013-08-16 MED ORDER — MIDAZOLAM HCL 5 MG/5ML IJ SOLN
INTRAMUSCULAR | Status: DC | PRN
  Administered 2013-08-16: 1 mg via INTRAVENOUS

## 2013-08-16 MED ORDER — HYDROCODONE-ACETAMINOPHEN 5-325 MG PO TABS
1.0000 | ORAL_TABLET | ORAL | Status: DC | PRN
  Administered 2013-08-16: 2 via ORAL
  Filled 2013-08-16: qty 2

## 2013-08-16 MED ORDER — SODIUM CHLORIDE 0.9 % IV SOLN
INTRAVENOUS | Status: DC
  Administered 2013-08-16: 17:00:00 via INTRAVENOUS

## 2013-08-16 MED ORDER — ALPRAZOLAM 0.25 MG PO TABS: 0.2500 mg | ORAL_TABLET | Freq: Two times a day (BID) | ORAL | Status: DC | PRN

## 2013-08-16 MED ORDER — ONDANSETRON HCL 4 MG/2ML IJ SOLN: 4.0000 mg | Freq: Four times a day (QID) | INTRAMUSCULAR | Status: DC | PRN

## 2013-08-16 MED ORDER — ONDANSETRON HCL 4 MG PO TABS: 4.0000 mg | ORAL_TABLET | Freq: Four times a day (QID) | ORAL | Status: DC | PRN

## 2013-08-16 MED ORDER — LIDOCAINE HCL (CARDIAC) 20 MG/ML IV SOLN
INTRAVENOUS | Status: DC | PRN
  Administered 2013-08-16: 50 mg via INTRAVENOUS

## 2013-08-16 MED ORDER — PROPOFOL 10 MG/ML IV BOLUS
INTRAVENOUS | Status: DC | PRN
  Administered 2013-08-16: 150 mg via INTRAVENOUS

## 2013-08-16 MED ORDER — ONDANSETRON HCL 4 MG/2ML IJ SOLN
INTRAMUSCULAR | Status: DC | PRN
  Administered 2013-08-16: 4 mg via INTRAVENOUS

## 2013-08-16 MED ORDER — HYDRALAZINE HCL 10 MG PO TABS
10.0000 mg | ORAL_TABLET | Freq: Four times a day (QID) | ORAL | Status: DC
  Filled 2013-08-16 (×7): qty 1

## 2013-08-16 MED ORDER — PHENYLEPHRINE HCL 10 MG/ML IJ SOLN
INTRAMUSCULAR | Status: DC | PRN
  Administered 2013-08-16 (×3): 200 ug via INTRAVENOUS

## 2013-08-16 MED ORDER — ARTIFICIAL TEARS OP OINT
TOPICAL_OINTMENT | OPHTHALMIC | Status: DC | PRN
  Administered 2013-08-16: 1 via OPHTHALMIC

## 2013-08-16 MED ORDER — TAMSULOSIN HCL 0.4 MG PO CAPS
0.4000 mg | ORAL_CAPSULE | Freq: Every day | ORAL | Status: DC
  Administered 2013-08-16: 0.4 mg via ORAL
  Filled 2013-08-16 (×2): qty 1

## 2013-08-16 MED ORDER — 0.9 % SODIUM CHLORIDE (POUR BTL) OPTIME
TOPICAL | Status: DC | PRN
  Administered 2013-08-16 (×2): 1000 mL

## 2013-08-16 MED ORDER — HYDROMORPHONE HCL PF 1 MG/ML IJ SOLN
0.2500 mg | INTRAMUSCULAR | Status: DC | PRN
  Administered 2013-08-16 (×2): 0.5 mg via INTRAVENOUS

## 2013-08-16 MED ORDER — SENNOSIDES-DOCUSATE SODIUM 8.6-50 MG PO TABS
1.0000 | ORAL_TABLET | Freq: Two times a day (BID) | ORAL | Status: DC
  Administered 2013-08-16 – 2013-08-17 (×2): 1 via ORAL
  Filled 2013-08-16 (×2): qty 1

## 2013-08-16 MED ORDER — HYDROMORPHONE HCL PF 1 MG/ML IJ SOLN: 0.5000 mg | INTRAMUSCULAR | Status: DC | PRN

## 2013-08-16 MED ORDER — LACTATED RINGERS IV SOLN
INTRAVENOUS | Status: DC
  Administered 2013-08-16: 12:00:00 via INTRAVENOUS

## 2013-08-16 SURGICAL SUPPLY — 43 items
BANDAGE ESMARK 6X9 LF (GAUZE/BANDAGES/DRESSINGS) ×1 IMPLANT
BANDAGE GAUZE ELAST BULKY 4 IN (GAUZE/BANDAGES/DRESSINGS) ×2 IMPLANT
BLADE SAW RECIP 87.9 MT (BLADE) ×2 IMPLANT
BLADE SURG 21 STRL SS (BLADE) ×2 IMPLANT
BNDG COHESIVE 6X5 TAN STRL LF (GAUZE/BANDAGES/DRESSINGS) ×2 IMPLANT
BNDG ESMARK 6X9 LF (GAUZE/BANDAGES/DRESSINGS) ×2
CLOTH BEACON ORANGE TIMEOUT ST (SAFETY) ×2 IMPLANT
COVER SURGICAL LIGHT HANDLE (MISCELLANEOUS) ×2 IMPLANT
CUFF TOURNIQUET SINGLE 34IN LL (TOURNIQUET CUFF) IMPLANT
CUFF TOURNIQUET SINGLE 44IN (TOURNIQUET CUFF) IMPLANT
DRAIN PENROSE 1/2X12 LTX STRL (WOUND CARE) IMPLANT
DRAPE EXTREMITY T 121X128X90 (DRAPE) ×2 IMPLANT
DRAPE PROXIMA HALF (DRAPES) ×4 IMPLANT
DRAPE U-SHAPE 47X51 STRL (DRAPES) ×4 IMPLANT
DRSG ADAPTIC 3X8 NADH LF (GAUZE/BANDAGES/DRESSINGS) ×2 IMPLANT
DRSG PAD ABDOMINAL 8X10 ST (GAUZE/BANDAGES/DRESSINGS) ×6 IMPLANT
DURAPREP 26ML APPLICATOR (WOUND CARE) ×2 IMPLANT
ELECT REM PT RETURN 9FT ADLT (ELECTROSURGICAL) ×2
ELECTRODE REM PT RTRN 9FT ADLT (ELECTROSURGICAL) ×1 IMPLANT
GLOVE BIOGEL PI IND STRL 9 (GLOVE) ×1 IMPLANT
GLOVE BIOGEL PI INDICATOR 9 (GLOVE) ×1
GLOVE SURG ORTHO 9.0 STRL STRW (GLOVE) ×2 IMPLANT
GOWN PREVENTION PLUS XLARGE (GOWN DISPOSABLE) ×2 IMPLANT
GOWN SRG XL XLNG 56XLVL 4 (GOWN DISPOSABLE) ×1 IMPLANT
GOWN STRL NON-REIN XL XLG LVL4 (GOWN DISPOSABLE) ×1
KIT BASIN OR (CUSTOM PROCEDURE TRAY) ×2 IMPLANT
KIT ROOM TURNOVER OR (KITS) ×2 IMPLANT
MANIFOLD NEPTUNE II (INSTRUMENTS) ×2 IMPLANT
NS IRRIG 1000ML POUR BTL (IV SOLUTION) ×2 IMPLANT
PACK GENERAL/GYN (CUSTOM PROCEDURE TRAY) ×2 IMPLANT
PAD ARMBOARD 7.5X6 YLW CONV (MISCELLANEOUS) ×4 IMPLANT
SPONGE GAUZE 4X4 12PLY (GAUZE/BANDAGES/DRESSINGS) ×2 IMPLANT
SPONGE LAP 18X18 X RAY DECT (DISPOSABLE) IMPLANT
STAPLER VISISTAT 35W (STAPLE) IMPLANT
STOCKINETTE IMPERVIOUS LG (DRAPES) ×2 IMPLANT
SUT PDS AB 1 CT  36 (SUTURE)
SUT PDS AB 1 CT 36 (SUTURE) IMPLANT
SUT SILK 2 0 (SUTURE) ×1
SUT SILK 2-0 18XBRD TIE 12 (SUTURE) ×1 IMPLANT
TOWEL OR 17X24 6PK STRL BLUE (TOWEL DISPOSABLE) ×2 IMPLANT
TOWEL OR 17X26 10 PK STRL BLUE (TOWEL DISPOSABLE) ×2 IMPLANT
TUBE ANAEROBIC SPECIMEN COL (MISCELLANEOUS) IMPLANT
WATER STERILE IRR 1000ML POUR (IV SOLUTION) ×2 IMPLANT

## 2013-08-16 NOTE — Progress Notes (Signed)
08/16/13 1125  OBSTRUCTIVE SLEEP APNEA  Have you ever been diagnosed with sleep apnea through a sleep study? No  Do you snore loudly (loud enough to be heard through closed doors)?  0  Do you often feel tired, fatigued, or sleepy during the daytime? 0  Has anyone observed you stop breathing during your sleep? 0  Do you have, or are you being treated for high blood pressure? 1  BMI more than 35 kg/m2? 1  Age over 50 years old? 1  Neck circumference greater than 40 cm/18 inches? 0  Gender: 1  Obstructive Sleep Apnea Score 4  Score 4 or greater  Results sent to PCP

## 2013-08-16 NOTE — Transfer of Care (Signed)
Immediate Anesthesia Transfer of Care Note  Patient: Albert [name]  Procedure(s) Performed: Procedure(s): STUMP REVISION- right Below Knee Amputation (Right)  Patient Location: PACU  Anesthesia Type:General  Level of Consciousness: sedated  Airway & Oxygen Therapy: Patient Spontanous Breathing and Patient connected to nasal cannula oxygen  Post-op Assessment: Report given to PACU RN, Post -op Vital signs reviewed and stable and Patient moving all extremities X 4  Post vital signs: Reviewed and stable  Complications: No apparent anesthesia complications

## 2013-08-16 NOTE — H&P (Signed)
Albert [name] is an 52 y.o. male.   Chief Complaint: Necrotic wound right transtibial amputation status post fall HPI: Patient is a 52 year old gentleman who is status post transtibial amputation. He is undergone inpatient rehabilitation however when discharged to home he fell on his leg sustaining a dehiscence of the lateral aspect of the wound.  Past Medical History  Diagnosis Date  . Hypertension   . Diabetes mellitus without complication ~2000    last HbA1c ~9  . Hypercholesteremia   . CVA (cerebral infarction) 2011    R hand deficit  . Chronic sinusitis   . Allergic rhinitis   . Chronic cough   . Complication of anesthesia     couldn't swallow and talk  . Stroke     Past Surgical History  Procedure Laterality Date  . 4th toe amputation Left Jan. 2014    4th toe in [place]  . Tonsillectomy and adenoidectomy    . Penile prosthesis implant    . I&d extremity Left 12/08/2012    Procedure: IRRIGATION AND DEBRIDEMENT EXTREMITY;  Surgeon: Kathryne Hitch, MD;  Location: [facility] OR;  Service: Orthopedics;  Laterality: Left;  . Amputation Left 12/08/2012    Procedure: AMPUTATION DIGIT;  Surgeon: Kathryne Hitch, MD;  Location: [facility] OR;  Service: Orthopedics;  Laterality: Left;  3rd toe amputation possible 5th toe amputation  . I&d extremity Left 12/11/2012    Procedure: REPEAT I&D LEFT FOOT;  Surgeon: Kathryne Hitch, MD;  Location: [facility] OR;  Service: Orthopedics;  Laterality: Left;  . Amputation Left 12/11/2012    Procedure: Amputation of 2nd Toe;  Surgeon: Kathryne Hitch, MD;  Location: [facility] OR;  Service: Orthopedics;  Laterality: Left;  . Bypass graft popliteal to tibial Left 12/13/2012    Procedure: BYPASS GRAFT POPLITEAL TO TIBIAL;  Surgeon: Nada Libman, MD;  Location: [facility] OR;  Service: Vascular;  Laterality: Left;  Left Popliteal to Posterior Tibial Bypass Graft with reversed saphenous vein graft  . Incision and drainage Right 06/25/2013    Procedure: INCISION AND  DRAINAGE RIGHT FOOT;  Surgeon: Kathryne Hitch, MD;  Location: [facility] ORS;  Service: Orthopedics;  Laterality: Right;  . Amputation Right 06/29/2013    Procedure: AMPUTATION BELOW KNEE;  Surgeon: Nadara Mustard, MD;  Location: [facility] OR;  Service: Orthopedics;  Laterality: Right;  . Tee without cardioversion [name] 07/03/2013    Procedure: TRANSESOPHAGEAL ECHOCARDIOGRAM (TEE);  Surgeon: Laurey Morale, MD;  Location[facility] ENDOSCOPY;  Service: Cardiovascular;  Laterality: [name];    Family History  Problem Relation Age of Onset  . Diabetes    . Pancreatic cancer Mother   . Diabetes Mother   . Hyperlipidemia Mother   . Breast cancer Sister   . Depression Maternal Grandmother   . Heart disease Maternal Grandmother   . Depression Maternal Grandfather   . Heart disease Maternal Grandfather   . Depression Paternal Grandmother   . Heart disease Paternal Grandmother   . Depression Paternal Grandfather   . Heart disease Paternal Grandfather    Social History:  reports that he has never smoked. He has never used smokeless tobacco. He reports that he does not drink alcohol or use illicit drugs.  Allergies:  Allergies  Allergen Reactions  . Amoxicillin Nausea And Vomiting    No prescriptions prior to admission    No results found for this or any previous visit (from the past 48 hour(s)). No results found.  Review of Systems  All other systems reviewed and  are negative.    There were no vitals taken for this visit. Physical Exam  On examination patient has necrotic tissue with a large open wound over the lateral aspect of his transtibial amputation. Assessment/Plan Assessment: Traumatic necrotic wound right transtibial amputation status post wall.  Plan: Will plan for revision of the transtibial amputation. Risks and benefits were discussed including infection neurovascular injury nonhealing of the wound need for additional surgery. Patient states he understands and wished to proceed at this  time.  DUDA,MARCUS V 08/16/2013, 6:39 AM

## 2013-08-16 NOTE — Preoperative (Signed)
Beta Blockers   Reason not to administer Beta Blockers:Not Applicable 

## 2013-08-16 NOTE — Op Note (Signed)
OPERATIVE REPORT  DATE OF SURGERY: 08/16/2013  PATIENT:  Albert [name],  52 y.o. male  PRE-OPERATIVE DIAGNOSIS:  Abscess, Necrotic Right BKA  POST-OPERATIVE DIAGNOSIS:  Abscess, Necrotic Right BKA  PROCEDURE:  Procedure(s): STUMP REVISION- right Below Knee Amputation  SURGEON:  Surgeon(s): Nadara Mustard, MD  ANESTHESIA:   general  EBL:  min ML  SPECIMEN:  No Specimen  TOURNIQUET:  * No tourniquets in log *  PROCEDURE DETAILS: Patient is a 52 year old gentleman with diabetic insensate neuropathy status post partial amputation of his left foot status post transmetatarsal amputation the right he was fallen the right leg sustaining a necrotic open wound. Patient presents at this time for revision amputation. Risks and benefits were discussed including persistent infection nonhealing of the wound need for higher level amputation. Patient states he understands and wishes to proceed at this time. Description of procedure patient brought to the operating room and underwent a general anesthetic. After adequate levels of anesthesia were obtained patient's right lower extremity was prepped using DuraPrep draped into a sterile field. A fishmouth incision was made around the previous incision to incorporate the necrotic wound. Skin soft tissue muscle and bone were resected back to healthy viable bleeding tissue. The wound was irrigated with normal saline. The incision was closed using 2-0 nylon and staples. The wound was covered with Adaptic orthopedic sponges AB dressing Kerlix and Coban. Patient was extubated taken to the PACU in stable condition.  PLAN OF CARE: Admit for overnight observation  PATIENT DISPOSITION:  PACU - hemodynamically stable.   Nadara Mustard, MD 08/16/2013 2:45 PM

## 2013-08-16 NOTE — Anesthesia Preprocedure Evaluation (Addendum)
Anesthesia Evaluation  Patient identified by MRN, date of birth, ID band Patient awake    Reviewed: Allergy & Precautions, H&P , NPO status , Patient's Chart, lab work & pertinent test results  Airway Mallampati: II      Dental   Pulmonary pneumonia -,          Cardiovascular hypertension, + CAD, + Past MI, + Peripheral Vascular Disease and +CHF     Neuro/Psych CVA    GI/Hepatic negative GI ROS, Neg liver ROS,   Endo/Other  diabetes  Renal/GU Renal disease     Musculoskeletal   Abdominal   Peds  Hematology   Anesthesia Other Findings   Reproductive/Obstetrics                          Anesthesia Physical Anesthesia Plan  ASA: III  Anesthesia Plan: General   Post-op Pain Management:    Induction: Intravenous  Airway Management Planned: LMA  Additional Equipment:   Intra-op Plan:   Post-operative Plan: Extubation in OR  Informed Consent: I have reviewed the patients History and Physical, chart, labs and discussed the procedure including the risks, benefits and alternatives for the proposed anesthesia with the patient or authorized representative who has indicated his/her understanding and acceptance.   Dental advisory given  Plan Discussed with: CRNA, Anesthesiologist and Surgeon  Anesthesia Plan Comments:        Anesthesia Quick Evaluation

## 2013-08-16 NOTE — Anesthesia Procedure Notes (Signed)
Procedure Name: Intubation Date/Time: 08/16/2013 2:00 PM Performed by: Carmela Rima Pre-anesthesia Checklist: Patient identified, Timeout performed, Emergency Drugs available, Suction available and Patient being monitored Patient Re-evaluated:Patient Re-evaluated prior to inductionOxygen Delivery Method: Circle system utilized Preoxygenation: Pre-oxygenation with 100% oxygen Intubation Type: IV induction, Rapid sequence and Cricoid Pressure applied Ventilation: Mask ventilation without difficulty Laryngoscope Size: Mac and 3 Grade View: Grade II Tube type: Oral Tube size: 7.5 mm Number of attempts: 1 Placement Confirmation: ETT inserted through vocal cords under direct vision,  positive ETCO2 and breath sounds checked- equal and bilateral Secured at: 22 cm Tube secured with: Tape Dental Injury: Teeth and Oropharynx as per pre-operative assessment

## 2013-08-16 NOTE — Anesthesia Postprocedure Evaluation (Signed)
  Anesthesia Post-op Note  Patient: Albert [name]  Procedure(s) Performed: Procedure(s): STUMP REVISION- right Below Knee Amputation (Right)  Patient Location: PACU  Anesthesia Type:General  Level of Consciousness: awake  Airway and Oxygen Therapy: Patient Spontanous Breathing  Post-op Pain: mild  Post-op Assessment: Post-op Vital signs reviewed  Post-op Vital Signs: Reviewed  Complications: No apparent anesthesia complications

## 2013-08-16 NOTE — Progress Notes (Signed)
[facility]  Patient Status: Active (receiving services up to time of hospitalization)  [facility] is providing the following services: RN, PT and OT  If patient discharges after hours, please call [phone].   Albert [name] 08/16/2013, 4:25 PM

## 2013-08-17 LAB — GLUCOSE, CAPILLARY
Glucose-Capillary: 158 mg/dL — ABNORMAL HIGH (ref 70–99)
Glucose-Capillary: 127 mg/dL — ABNORMAL HIGH (ref 70–99)

## 2013-08-17 MED ORDER — OXYCODONE-ACETAMINOPHEN 10-325 MG PO TABS: 1.0000 | ORAL_TABLET | ORAL | Status: DC | PRN

## 2013-08-17 NOTE — Evaluation (Signed)
Physical Therapy Evaluation Patient Details Name: Albert [name] MRN: [id number] DOB: [birth year] Today's Date: 08/17/2013 Time: 4098-1191 PT Time Calculation (min): 26 min  PT Assessment / Plan / Recommendation History of Present Illness  Pt s/p R stump revision/BKA. Pt with history of R hemiplegia from CVA in 2011.  Clinical Impression  Patient is s/p R stump revision/BKA surgery resulting in functional limitations due to the deficits listed below (see PT Problem List). Pt demo'd safe slide board transfer with min guard and is safe to d/c home once medically stable. Patient will benefit from skilled PT to increase their independence and safety with mobility to allow discharge to the venue listed below. Pt to benefit from resumption of HHPT.     PT Assessment  Patient needs continued PT services    Follow Up Recommendations  Home health PT;Supervision/Assistance - 24 hour    Does the patient have the potential to tolerate intense rehabilitation      Barriers to Discharge        Equipment Recommendations  None recommended by PT    Recommendations for Other Services     Frequency Min 3X/week    Precautions / Restrictions Precautions Precautions: Fall Restrictions RLE Weight Bearing: Non weight bearing   Pertinent Vitals/Pain 5/10 R LE surgical pain      Mobility  Bed Mobility Bed Mobility: Supine to Sit Supine to Sit: 5: Supervision;With rails;HOB flat Sitting - Scoot to Edge of Bed: 5: Supervision;With rail Details for Bed Mobility Assistance: pt with definite use of rail Transfers Transfers: Lateral/Scoot Transfers Lateral/Scoot Transfers: 4: Min guard;With armrests removed;With slide board Details for Transfer Assistance: mod v/c's for slide board transfer, minA to hold chair . Ambulation/Gait Ambulation/Gait Assistance: Not tested (comment) Wheelchair Mobility Wheelchair Mobility: Yes Wheelchair Assistance: 5: Supervision Wheelchair Assistance Details  (indicate cue type and reason): pt demo'd how to lock w/c and how to position safely for transfer    Exercises     PT Diagnosis: Generalized weakness  PT Problem List: Decreased strength;Decreased activity tolerance;Decreased mobility PT Treatment Interventions: DME instruction;Functional mobility training;Therapeutic activities;Therapeutic exercise;Wheelchair mobility training     PT Goals(Current goals can be found in the care plan section) Acute Rehab PT Goals Patient Stated Goal: home PT Goal Formulation: With patient Time For Goal Achievement: 08/20/13 Potential to Achieve Goals: Good  Visit Information  Last PT Received On: 08/17/13 Assistance Needed: +1 History of Present Illness: Pt s/p R stump revision/BKA. Pt with history of R hemiplegia from CVA in 2011.       Prior Functioning  Home Living Family/patient expects to be discharged to:: Private residence Living Arrangements: Spouse/significant other;Children Available Help at Discharge: Family;Friend(s);Available 24 hours/day Type of Home: House Home Access: Ramped entrance Home Layout: Two level;Able to live on main level with bedroom/bathroom Home Equipment: Bedside commode;Wheelchair - manual Additional Comments: pt was at [facility] d/c'd home 2.5 weeks ago and received home health services Prior Function Level of Independence: Needs assistance Gait / Transfers Assistance Needed: w/c dependent ADL's / Homemaking Assistance Needed: assist with bathing/dressing and meal prep Communication Communication: No difficulties Dominant Hand: Left    Cognition  Cognition Arousal/Alertness: Awake/alert Behavior During Therapy: WFL for tasks assessed/performed Overall Cognitive Status: Within Functional Limits for tasks assessed    Extremity/Trunk Assessment Upper Extremity Assessment Upper Extremity Assessment: RUE deficits/detail RUE Deficits / Details: residual weakness from old CVA Lower Extremity Assessment RLE  Deficits / Details: s/p BKA with intact knee extension . Strength grossly 3/5 hip and knee  flexion LLE Deficits / Details: s/p 4 toe amputations (all but Great toe), otherwise intact strength/ROM   Balance Static Sitting Balance Static Sitting - Balance Support: Feet supported Static Sitting - Level of Assistance: 5: Stand by assistance Static Sitting - Comment/# of Minutes: 5 min  End of Session PT - End of Session Equipment Utilized During Treatment: Gait belt Activity Tolerance: Patient tolerated treatment well Patient left: in chair;with call bell/phone within reach Nurse Communication: Mobility status  GP Functional Assessment Tool Used: clinical judgement Functional Limitation: Mobility: Walking and moving around Mobility: Walking and Moving Around Current Status [phone]): At least 40 percent but less than 60 percent impaired, limited or restricted Mobility: Walking and Moving Around Goal Status [phone]): At least 20 percent but less than 40 percent impaired, limited or restricted   [name] 08/17/2013, 8:32 AM  Lewis Shock, PT, DPT Pager #: [phone] Office #: [phone]

## 2013-08-17 NOTE — Discharge Summary (Signed)
Physician Discharge Summary  Patient ID: Albert [name] MRN: [id number] DOB/AGE: Oct 24, 1960 52 y.o.  Admit date: 08/16/2013 Discharge date: 08/17/2013  Admission Diagnoses: Dehiscence right transtibial amputation status post fall  Discharge Diagnoses: Dehiscence right transtibial amputation status post fall Active Problems:   * No active hospital problems. *   Discharged Condition: stable  Hospital Course: Patient's hospital course was essentially unremarkable. He underwent revision of the amputation. Patient still had some mild ischemic changes but the tissue was viable. Patient postoperative course was essentially unremarkable he was discharged to home in stable condition.  Consults: None  Significant Diagnostic Studies: labs: Routine labs  Treatments: surgery: See operative note  Discharge Exam: Blood pressure 99/68, pulse 80, temperature 98.1 F (36.7 C), temperature source Oral, resp. rate 18, SpO2 99.00%. Incision/Wound: dressing clean dry and intact  Disposition: 70-Another Health Care Institution Not Defined   Future Appointments Provider Department Dept Phone   09/03/2013 1:30 PM Loreli Slot, MD [facility] [phone]       Medication List    ASK your doctor about these medications       ALPRAZolam 0.25 MG tablet  Commonly known as:  XANAX  Take 1 tablet (0.25 mg total) by mouth 2 (two) times daily as needed for anxiety.     aspirin EC 81 MG tablet  Take 1 tablet (81 mg total) by mouth daily.     carvedilol 25 MG tablet  Commonly known as:  COREG  Take 1 tablet (25 mg total) by mouth 2 (two) times daily with a meal.     furosemide 80 MG tablet  Commonly known as:  LASIX  Take 1 tablet (80 mg total) by mouth 2 (two) times daily.     hydrALAZINE 10 MG tablet  Commonly known as:  APRESOLINE  Take 1 tablet (10 mg total) by mouth every 6 (six) hours.     insulin detemir 100 UNIT/ML injection  Commonly  known as:  LEVEMIR  Inject 0.23 mLs (23 Units total) into the skin at bedtime.     lisinopril 5 MG tablet  Commonly known as:  PRINIVIL,ZESTRIL  Take 1 tablet (5 mg total) by mouth daily.     Oxycodone HCl 10 MG Tabs  Take 1 tablet (10 mg total) by mouth every 4 (four) hours as needed.     pantoprazole 40 MG tablet  Commonly known as:  PROTONIX  Take 1 tablet (40 mg total) by mouth daily.     polyethylene glycol packet  Commonly known as:  MIRALAX / GLYCOLAX  Take 17 g by mouth daily.     senna-docusate 8.6-50 MG per tablet  Commonly known as:  Senokot-S  Take 1 tablet by mouth 2 (two) times daily.     simvastatin 40 MG tablet  Commonly known as:  ZOCOR  Take 1 tablet (40 mg total) by mouth every evening.     tamsulosin 0.4 MG Caps capsule  Commonly known as:  FLOMAX  Take 1 capsule (0.4 mg total) by mouth daily after supper.           Follow-up Information   Follow up with DUDA,MARCUS V, MD In 1 week.   Specialty:  Orthopedic Surgery   Contact information:   [address] [place] [address] [phone]       Signed: Nadara Mustard 08/17/2013, 7:04 AM

## 2013-08-17 NOTE — Progress Notes (Signed)
   CARE MANAGEMENT NOTE 08/17/2013  Patient:  Albert [name], Albert [name]   Account Number:  [id number]  Date Initiated:  08/17/2013  Documentation initiated by:  [facility]  Subjective/Objective Assessment:   Dehiscence right transtibial amputation status post fall     Action/Plan:   home with wife   Anticipated DC Date:  08/17/2013   Anticipated DC Plan:  HOME W HOME HEALTH SERVICES      DC Planning Services  CM consult      [facility] Choice  Resumption Of Svcs/PTA Provider   Choice offered to / List presented to:             [facility] agency  [facility]   Status of service:  Completed, signed off Medicare Important Message given?   (If response is "NO", the following Medicare IM given date fields will be blank) Date Medicare IM given:   Date Additional Medicare IM given:    Discharge Disposition:  HOME W HOME HEALTH SERVICES  Per UR Regulation:    If discussed at Long Length of Stay Meetings, dates discussed:    Comments:

## 2013-08-18 NOTE — Care Management Note (Signed)
    Page 1 of 1   08/18/2013     6:03:08 PM   CARE MANAGEMENT NOTE 08/18/2013  Patient:  Albert [name], Albert [name]   Account Number:  [id number]  Date Initiated:  08/17/2013  Documentation initiated by:  [facility]  Subjective/Objective Assessment:   Dehiscence right transtibial amputation status post fall     Action/Plan:   home with wife   Anticipated DC Date:  08/17/2013   Anticipated DC Plan:  HOME W HOME HEALTH SERVICES      DC Planning Services  CM consult      [facility] Choice  Resumption Of Svcs/PTA Samiya Mervin   Choice offered to / List presented to:             [facility] agency  [facility]   Status of service:  Completed, signed off Medicare Important Message given?   (If response is "NO", the following Medicare IM given date fields will be blank) Date Medicare IM given:   Date Additional Medicare IM given:    Discharge Disposition:  HOME W HOME HEALTH SERVICES  Per UR Regulation:    If discussed at Long Length of Stay Meetings, dates discussed:    Comments:  08/18/13 18:00 CM called [facility] to verify Resumption of HH orders will begin 08/19/13 for pt.  [facility] confirmed Resumption of orders.  Freddy Jaksch, BSN, CM [phone].

## 2013-08-20 ENCOUNTER — Encounter (HOSPITAL_COMMUNITY): Payer: Self-pay | Admitting: Orthopedic Surgery

## 2013-08-22 ENCOUNTER — Other Ambulatory Visit: Payer: Self-pay

## 2013-08-27 ENCOUNTER — Telehealth: Payer: Self-pay

## 2013-08-27 NOTE — Telephone Encounter (Signed)
OK to order PT from [facility] 2x/wk for 3 weeks

## 2013-08-27 NOTE — Telephone Encounter (Signed)
Debbie a physical therapist with [facility] called to get verbal order for care.  She would like to see the patient 2 times a week for 3 weeks for therapy.

## 2013-08-28 NOTE — Telephone Encounter (Signed)
Verbal given to Debbie with [facility] to continue physical therapy.

## 2013-09-03 ENCOUNTER — Encounter: Payer: Self-pay | Admitting: Thoracic Surgery (Cardiothoracic Vascular Surgery)

## 2013-09-03 ENCOUNTER — Institutional Professional Consult (permissible substitution) (INDEPENDENT_AMBULATORY_CARE_PROVIDER_SITE_OTHER): Payer: 59 | Admitting: Thoracic Surgery (Cardiothoracic Vascular Surgery)

## 2013-09-03 VITALS — BP 119/82 | HR 90 | Resp 20 | Ht 71.0 in | Wt 268.0 lb

## 2013-09-03 DIAGNOSIS — E1065 Type 1 diabetes mellitus with hyperglycemia: Secondary | ICD-10-CM

## 2013-09-03 DIAGNOSIS — I639 Cerebral infarction, unspecified: Secondary | ICD-10-CM

## 2013-09-03 DIAGNOSIS — I739 Peripheral vascular disease, unspecified: Secondary | ICD-10-CM

## 2013-09-03 DIAGNOSIS — S98919A Complete traumatic amputation of unspecified foot, level unspecified, initial encounter: Secondary | ICD-10-CM

## 2013-09-03 DIAGNOSIS — IMO0002 Reserved for concepts with insufficient information to code with codable children: Secondary | ICD-10-CM

## 2013-09-03 DIAGNOSIS — I2510 Atherosclerotic heart disease of native coronary artery without angina pectoris: Secondary | ICD-10-CM

## 2013-09-03 DIAGNOSIS — Z89511 Acquired absence of right leg below knee: Secondary | ICD-10-CM

## 2013-09-03 DIAGNOSIS — S88119A Complete traumatic amputation at level between knee and ankle, unspecified lower leg, initial encounter: Secondary | ICD-10-CM

## 2013-09-03 DIAGNOSIS — Z89432 Acquired absence of left foot: Secondary | ICD-10-CM

## 2013-09-03 DIAGNOSIS — I6350 Cerebral infarction due to unspecified occlusion or stenosis of unspecified cerebral artery: Secondary | ICD-10-CM

## 2013-09-03 NOTE — Progress Notes (Signed)
PCP is Rollene Rotunda, MD Referring Provider is Kathleene Hazel*  Chief Complaint  Patient presents with  . Coronary Artery Disease    [facility] Pt cosult, surgical eval for CABG, Cardiac Cath 06/28/13, 2D Echo 07/15/13,PFT's 07/03/13    HPI: 52 year old male with severe complicated diabetes who was found to have three-vessel coronary disease on catheterization on September 12 presents for evaluation for possible surgery.  Mr. Townley is an unfortunate 52 year old gentleman with multiple severe medical problems including complicated diabetes, stroke, peripheral arterial disease, gangrene with sepsis requiring below knee amputation, coronary disease and ischemic cardiomyopathy. He was admitted back in September with septic shock due to gangrenous right leg. He had a right below-knee amputation. In this setting he had a non-Q-wave MI. He underwent cardiac catheterization which revealed severe three-vessel coronary disease. He was treated medically as he was not a candidate for surgical intervention at that time.  He eventually went to rehabilitation and subsequently has been discharged from rehabilitation. He did have a revision of his right below the knee amputation by Dr. Lajoyce Corners on 08/16/2013 for abscess and necrosis. He currently is receiving physical therapy 3 times a week at home.  He denies any chest pain. He does get short of breath with exertion. His physical activities limited by his right below knee amputation, left transmetatarsal amputation, and chronic right arm weakness due to his previous stroke.   Past Medical History  Diagnosis Date  . Hypertension   . Diabetes mellitus without complication ~2000    last HbA1c ~9  . Hypercholesteremia   . CVA (cerebral infarction) 2011    R hand deficit  . Chronic sinusitis   . Allergic rhinitis   . Chronic cough   . Complication of anesthesia     couldn't swallow and talk  . Stroke     Past Surgical History  Procedure Laterality Date  .  4th toe amputation Left Jan. 2014    4th toe in [place]  . Tonsillectomy and adenoidectomy    . Penile prosthesis implant    . I&d extremity Left 12/08/2012    Procedure: IRRIGATION AND DEBRIDEMENT EXTREMITY;  Surgeon: Kathryne Hitch, MD;  Location: [facility] OR;  Service: Orthopedics;  Laterality: Left;  . Amputation Left 12/08/2012    Procedure: AMPUTATION DIGIT;  Surgeon: Kathryne Hitch, MD;  Location: [facility] OR;  Service: Orthopedics;  Laterality: Left;  3rd toe amputation possible 5th toe amputation  . I&d extremity Left 12/11/2012    Procedure: REPEAT I&D LEFT FOOT;  Surgeon: Kathryne Hitch, MD;  Location: [facility] OR;  Service: Orthopedics;  Laterality: Left;  . Amputation Left 12/11/2012    Procedure: Amputation of 2nd Toe;  Surgeon: Kathryne Hitch, MD;  Location: [facility] OR;  Service: Orthopedics;  Laterality: Left;  . Bypass graft popliteal to tibial Left 12/13/2012    Procedure: BYPASS GRAFT POPLITEAL TO TIBIAL;  Surgeon: Nada Libman, MD;  Location: [facility] OR;  Service: Vascular;  Laterality: Left;  Left Popliteal to Posterior Tibial Bypass Graft with reversed saphenous vein graft  . Incision and drainage Right 06/25/2013    Procedure: INCISION AND DRAINAGE RIGHT FOOT;  Surgeon: Kathryne Hitch, MD;  Location: [facility] ORS;  Service: Orthopedics;  Laterality: Right;  . Amputation Right 06/29/2013    Procedure: AMPUTATION BELOW KNEE;  Surgeon: Nadara Mustard, MD;  Location: [facility] OR;  Service: Orthopedics;  Laterality: Right;  . Tee without cardioversion [name] 07/03/2013    Procedure: TRANSESOPHAGEAL ECHOCARDIOGRAM (TEE);  Surgeon: Eliot Ford  Shirlee Latch, MD;  Location: [facility] ENDOSCOPY;  Service: Cardiovascular;  Laterality: [name];  . Stump revision Right 08/16/2013    Procedure: STUMP REVISION- right Below Knee Amputation;  Surgeon: Nadara Mustard, MD;  Location: [facility] OR;  Service: Orthopedics;  Laterality: Right;    Family History  Problem Relation Age of Onset  . Diabetes    . Pancreatic cancer  Mother   . Diabetes Mother   . Hyperlipidemia Mother   . Breast cancer Sister   . Depression Maternal Grandmother   . Heart disease Maternal Grandmother   . Depression Maternal Grandfather   . Heart disease Maternal Grandfather   . Depression Paternal Grandmother   . Heart disease Paternal Grandmother   . Depression Paternal Grandfather   . Heart disease Paternal Grandfather     Social History History  Substance Use Topics  . Smoking status: Never Smoker   . Smokeless tobacco: Never Used  . Alcohol Use: No    Current Outpatient Prescriptions  Medication Sig Dispense Refill  . ALPRAZolam (XANAX) 0.25 MG tablet Take 1 tablet (0.25 mg total) by mouth 2 (two) times daily as needed for anxiety.  30 tablet  0  . aspirin EC 81 MG tablet Take 1 tablet (81 mg total) by mouth daily.      . carvedilol (COREG) 25 MG tablet Take 1 tablet (25 mg total) by mouth 2 (two) times daily with a meal.  60 tablet  1  . doxycycline (VIBRA-TABS) 100 MG tablet Take 100 mg by mouth 2 (two) times daily.       . furosemide (LASIX) 80 MG tablet Take 1 tablet (80 mg total) by mouth 2 (two) times daily.  60 tablet  1  . hydrALAZINE (APRESOLINE) 10 MG tablet Take 1 tablet (10 mg total) by mouth every 6 (six) hours.  120 tablet  1  . insulin detemir (LEVEMIR) 100 UNIT/ML injection Inject 0.23 mLs (23 Units total) into the skin at bedtime.  10 mL  12  . lisinopril (PRINIVIL,ZESTRIL) 5 MG tablet Take 1 tablet (5 mg total) by mouth daily.  30 tablet  1  . oxyCODONE 10 MG TABS Take 1 tablet (10 mg total) by mouth every 4 (four) hours as needed.  90 tablet  0  . oxyCODONE-acetaminophen (PERCOCET) 10-325 MG per tablet Take 1 tablet by mouth every 4 (four) hours as needed for pain.  60 tablet  0  . pantoprazole (PROTONIX) 40 MG tablet Take 1 tablet (40 mg total) by mouth daily.  30 tablet  1  . polyethylene glycol (MIRALAX / GLYCOLAX) packet Take 17 g by mouth daily.      Marland Kitchen senna-docusate (SENOKOT-S) 8.6-50 MG per tablet  Take 1 tablet by mouth 2 (two) times daily.      . simvastatin (ZOCOR) 40 MG tablet Take 1 tablet (40 mg total) by mouth every evening.  30 tablet  1  . tamsulosin (FLOMAX) 0.4 MG CAPS capsule Take 1 capsule (0.4 mg total) by mouth daily after supper.  30 capsule  1   No current facility-administered medications for this visit.    Allergies  Allergen Reactions  . Amoxicillin Nausea And Vomiting    Review of Systems  Constitutional: Positive for activity change (limited to wheelchair).  Respiratory: Positive for cough.   Cardiovascular: Positive for leg swelling. Negative for chest pain.  Neurological: Positive for speech difficulty, weakness (right arm secondary to stroke) and numbness.  All other systems reviewed and are negative.    BP  119/82  Pulse 90  Resp 20  Ht 5\' 11"  (1.803 m)  Wt 268 lb (121.564 kg)  BMI 37.39 kg/m2  SpO2 97% Physical Exam  Vitals reviewed. Constitutional: He is oriented to person, place, and time. No distress.  obese  HENT:  Head: Normocephalic and atraumatic.  Eyes: EOM are normal. Pupils are equal, round, and reactive to light.  Neck: Neck supple. No thyromegaly present.  Cardiovascular: Normal rate and regular rhythm.   Murmur (2/6 systolic murmur) heard. Pulmonary/Chest:  Diminished BS bilaterally, no wheezing or rales  Abdominal: Soft. There is no tenderness.  Musculoskeletal: He exhibits edema (3+ in right thigh, 3 + left lower leg).  Well healed surgical scars below knee on left, R BKA stump wrapped  Lymphadenopathy:    He has no cervical adenopathy.  Neurological: He is alert and oriented to person, place, and time.  Slurred speech, right arm weakness  Skin: Skin is warm and dry.     Diagnostic Tests: CARDIAC CATHETERIZATION 06/28/2013 Hemodynamic Findings:  Central aortic pressure: 90/61  Left ventricular pressure: 90/19/29  Angiographic Findings:  Left main: Short segment without obstruction.  Left Anterior Descending  Artery: Moderate caliber diffusely diseased vessel that courses to the apex. The proximal vessel has diffuse 40% stenosis. The mid vessel has a 70% stenosis followed by a 60% stenosis. The distal vessel has diffuse 90% stenosis. The diagonal is a moderate caliber vessel with ostial 99% stenosis with 99% disease extending into the mid vessel.  Circumflex Artery: Large caliber vessel with proximal 80-90% stenosis. There are three small to moderate caliber obtuse marginal branches. The first OM has mid 80% stenosis. The second OM has a proximal 99% stenosis. The third OM has diffuse 99% stenosis. The left sided posterolateral branch has 99% stenosis.  Right Coronary Artery: Small non-dominant vessel with mid 99% stenosis.  Left Ventricular Angiogram: LVEF=40%  Impression:  1. Triple vessel CAD  2. NSTEMI secondary to demand ischemia from sepsis with severe underlying CAD  3. Mild LV systolic dysfunction  Recommendations: Complex situation. The only option for coronary revascularization is CABG. He is not a favorable candidate for CABG at this time with necrotic right foot, sepsis. Even though not ideal, would favor medical management of CAD while undergoing treatment of his necrotic foot. Would proceed with right BKA this weekend. I have discussed the case with CT surgery who agrees that CABG is not a good option at this time. Will ask CT surgery to see after he recovers from his BKA. Continue ASA, beta blocker, statin.  Complications: None. The patient tolerated the procedure well.   TEE 07/03/2013 Study Conclusions  - Left ventricle: The cavity size was normal. Wall thickness was normal. Systolic function was mildly reduced. The estimated ejection fraction was in the range of 45% to 50%. Septal hypokinesis. - Aortic valve: No evidence of vegetation. There was no stenosis. - Aorta: Normal caliber aorta with grade III plaque in the arch and descending thoracic aorta. - Mitral valve: No evidence of  vegetation. Moderate regurgitation. Effective regurgitant orifice: 0.22cm^2 (PISA). - Left atrium: The atrium was mildly dilated. No evidence of thrombus in the atrial cavity or appendage. - Pulmonary veins: No systolic flow reversal in pulmonary vein doppler pattern. - Right ventricle: The cavity size was normal. Systolic function was normal. - Right atrium: Catheter tip in RA, no vegetation. No evidence of thrombus in the atrial cavity or appendage. - Atrial septum: No defect or patent foramen ovale was identified. Echo contrast study  showed no right-to-left atrial level shunt, at baseline or with provocation. - Tricuspid valve: No evidence of vegetation. - Pulmonic valve: No evidence of vegetation. - Pericardium, extracardiac: A trivial pericardial effusion was identified. Impressions:  - No evidence of endocarditis.    VEIN MAPPING 12/07/2012 GSV right leg 3.72 mm proximal calf, 4.17 mm proximal thigh, 7.67 mm origin GS of the left leg 3.32 mm left knee, 3.52 mm left thigh, 3.70 mm origin  PULMONARY FUNCTION TESTING 07/03/2013 FVC =1.99 (percent) 2.47 (47%) after bronchodilator FEV1= 1.40 (34%)  1.73 (42%) after bronchodilator  FEV1/FVC 70% FEF 25-75 0.90 25% Interpretation Severe airflow limitation. Cannot exclude coexisting restrictive disease. Significant improvement in air flow after bronchodilators  Impression: 51 year old gentleman with multiple severe medical problems including poorly controlled, complicated diabetes, peripheral vascular disease, and previous stroke. During a hospitalization for sepsis due to gangrene of his right foot he had a non-Q-wave MI. Cardiac catheterization revealed a left dominant circulation with three-vessel disease equivalent. Echocardiogram was reported his ejection fraction in the 40-50% range. He also has moderate mitral regurgitation.  The ideal treatment for his cardiac disease would be coronary bypass grafting, with possible mitral  valve annuloplasty depending on intraoperative findings. This would be an extremely high-risk endeavor in this individual, and I do not think he is a candidate for that surgery currently. He only recently has had his below-knee amputation stump revised and has not completely healed. He remains severely debilitated.  Should his overall condition improved dramatically, we could consider coronary bypass grafting. However even then it would be a difficult situation given the limitations of available conduit. He is not a candidate for bilateral mammaries due to his severe diabetes. He has severe edema in both lower extremities and I suspect his saphenous vein will be marginal at best. He has had peripheral bypass below the knee on the left side. Wound healing likely will be problematic at best. He has an equivocal Allen's test on the left hand and given his right arm weakness, the left hand is his dominant. Pulse in the right arm is weak.  Overall I think his best option at this point in time his medical therapy. We will refer him back to his cardiologist. I will plan to see him back after the first of the year to see if he has made sufficient progress that surgery is a consideration.

## 2013-09-06 ENCOUNTER — Telehealth: Payer: Self-pay

## 2013-09-06 NOTE — Telephone Encounter (Signed)
Debbie from [facility] called to get a verbal order to extend Physical Therapy 3 times a week for 2 weeks.

## 2013-09-09 NOTE — Telephone Encounter (Signed)
Okay to do PT 3 times per week

## 2013-09-09 NOTE — Telephone Encounter (Signed)
Message given to Debbie

## 2013-09-23 ENCOUNTER — Ambulatory Visit: Payer: 59 | Admitting: Nurse Practitioner

## 2013-10-01 ENCOUNTER — Encounter: Payer: Self-pay | Admitting: Nurse Practitioner

## 2013-10-01 ENCOUNTER — Ambulatory Visit (INDEPENDENT_AMBULATORY_CARE_PROVIDER_SITE_OTHER): Payer: 59 | Admitting: Nurse Practitioner

## 2013-10-01 VITALS — BP 120/90 | HR 95 | Ht 71.0 in | Wt 271.0 lb

## 2013-10-01 DIAGNOSIS — R0600 Dyspnea, unspecified: Secondary | ICD-10-CM

## 2013-10-01 DIAGNOSIS — R0609 Other forms of dyspnea: Secondary | ICD-10-CM

## 2013-10-01 DIAGNOSIS — R1110 Vomiting, unspecified: Secondary | ICD-10-CM

## 2013-10-01 DIAGNOSIS — I259 Chronic ischemic heart disease, unspecified: Secondary | ICD-10-CM

## 2013-10-01 LAB — CBC WITH DIFFERENTIAL/PLATELET
Basophils Absolute: 0 10*3/uL (ref 0.0–0.1)
Basophils Relative: 0.4 % (ref 0.0–3.0)
Eosinophils Absolute: 0.1 10*3/uL (ref 0.0–0.7)
Eosinophils Relative: 2 % (ref 0.0–5.0)
HCT: 29.4 % — ABNORMAL LOW (ref 39.0–52.0)
Hemoglobin: 9 g/dL — ABNORMAL LOW (ref 13.0–17.0)
Lymphocytes Relative: 21.8 % (ref 12.0–46.0)
Lymphs Abs: 1.4 10*3/uL (ref 0.7–4.0)
MCHC: 30.7 g/dL (ref 30.0–36.0)
MCV: 70.4 fl — ABNORMAL LOW (ref 78.0–100.0)
Monocytes Absolute: 0.5 10*3/uL (ref 0.1–1.0)
Monocytes Relative: 7.8 % (ref 3.0–12.0)
Neutro Abs: 4.4 10*3/uL (ref 1.4–7.7)
Neutrophils Relative %: 68 % (ref 43.0–77.0)
Platelets: 292 10*3/uL (ref 150.0–400.0)
RBC: 4.18 Mil/uL — ABNORMAL LOW (ref 4.22–5.81)
RDW: 18.9 % — ABNORMAL HIGH (ref 11.5–14.6)
WBC: 6.5 10*3/uL (ref 4.5–10.5)

## 2013-10-01 LAB — BASIC METABOLIC PANEL
BUN: 15 mg/dL (ref 6–23)
CO2: 24 mEq/L (ref 19–32)
Calcium: 9.1 mg/dL (ref 8.4–10.5)
Chloride: 104 mEq/L (ref 96–112)
Creatinine, Ser: 0.9 mg/dL (ref 0.4–1.5)
GFR: 95.84 mL/min (ref >60.00)
Glucose, Bld: 189 mg/dL — ABNORMAL HIGH (ref 70–99)
Potassium: 3.7 mEq/L (ref 3.5–5.1)
Sodium: 137 mEq/L (ref 135–145)

## 2013-10-01 LAB — BRAIN NATRIURETIC PEPTIDE: Pro B Natriuretic peptide (BNP): 1302 pg/mL — ABNORMAL HIGH (ref 0.0–100.0)

## 2013-10-01 LAB — HEPATIC FUNCTION PANEL
ALT: 13 U/L (ref 0–53)
AST: 13 U/L (ref 0–37)
Albumin: 3.8 g/dL (ref 3.5–5.2)
Alkaline Phosphatase: 56 U/L (ref 39–117)
Bilirubin, Direct: 0.1 mg/dL (ref 0.0–0.3)
Total Bilirubin: 0.6 mg/dL (ref 0.3–1.2)
Total Protein: 6.6 g/dL (ref 6.0–8.3)

## 2013-10-01 LAB — LIPASE: Lipase: 23 U/L (ref 11.0–59.0)

## 2013-10-01 LAB — TSH: TSH: 5.58 u[IU]/mL — ABNORMAL HIGH (ref 0.35–5.50)

## 2013-10-01 LAB — AMYLASE: Amylase: 28 U/L (ref 27–131)

## 2013-10-01 MED ORDER — HYDRALAZINE HCL 10 MG PO TABS: 10.0000 mg | ORAL_TABLET | Freq: Three times a day (TID) | ORAL | Status: DC

## 2013-10-01 MED ORDER — LOSARTAN POTASSIUM 25 MG PO TABS: 25.0000 mg | ORAL_TABLET | Freq: Every day | ORAL | Status: DC

## 2013-10-01 NOTE — Progress Notes (Signed)
Ryne Daniel Nones Date of Birth: Sep 24, 1961 Medical Record #[id number]  History of Present Illness: Mr. Wojcicki is seen back today for a post hospital visit. Seen for Dr. Swaziland. He is a 52 year old male with multiple medical issues. These include poorly controlled DM, SIRS/MSSA bacteremia from a gangrenous right foot with subsequent right BKA back in September of 2014. During his acute illness he got hypotensive and had positive troponins - was cathed - has severe 3VD with an EF of 40%. TEE showed moderate MR. Other issues include HLD, CVA, and HTN.  Seen last month by Dr. Dorris Fetch for consideration of CABG - he referred Mr. Boody back here  - his thoughts are as follow:  Impression:  52 year old gentleman with multiple severe medical problems including poorly controlled, complicated diabetes, peripheral vascular disease, and previous stroke. During a hospitalization for sepsis due to gangrene of his right foot he had a non-Q-wave MI. Cardiac catheterization revealed a left dominant circulation with three-vessel disease equivalent. Echocardiogram was reported his ejection fraction in the 40-50% range. He also has moderate mitral regurgitation.  The ideal treatment for his cardiac disease would be coronary bypass grafting, with possible mitral valve annuloplasty depending on intraoperative findings. This would be an extremely high-risk endeavor in this individual, and I do not think he is a candidate for that surgery currently. He only recently has had his below-knee amputation stump revised and has not completely healed. He remains severely debilitated.  Should his overall condition improved dramatically, we could consider coronary bypass grafting. However even then it would be a difficult situation given the limitations of available conduit. He is not a candidate for bilateral mammaries due to his severe diabetes. He has severe edema in both lower extremities and I suspect his saphenous vein will be  marginal at best. He has had peripheral bypass below the knee on the left side. Wound healing likely will be problematic at best. He has an equivocal Allen's test on the left hand and given his right arm weakness, the left hand is his dominant. Pulse in the right arm is weak.  Overall I think his best option at this point in time his medical therapy. We will refer him back to his cardiologist. I will plan to see him back after the first of the year to see if he has made sufficient progress that surgery is a consideration.    Comes in today. Here with his wife.  He has been home since the end of October. Has not seen a physician except for Dr. Dorris Fetch. His note was reviewed. He does not have a primary care doctor until February - will be establishing with Dr. Dwana Melena. He has been doing ok for the most part. No chest pain/discomfort. Not really short of breath. Lots of swelling in the left leg - tries to elevate. Lots of coughing - wonders if it is from his medicines. Has had no labs since discharge. Still seeing Dr. Lajoyce Corners for his amputation site - still with delayed healing. Tries to not use salt. Tolerating his medicines and seems to know what his medicines are - does admit that he is not taking the hydralazine 4 times a day. Wife notes that he "vomits bile" almost every morning. No abdominal pain - still has his gallbladder. Also needing a note for disability that he will not be returning to the work force.   Current Outpatient Prescriptions  Medication Sig Dispense Refill  . ALPRAZolam (XANAX) 0.25 MG tablet Take  1 tablet (0.25 mg total) by mouth 2 (two) times daily as needed for anxiety.  30 tablet  0  . carvedilol (COREG) 25 MG tablet Take 1 tablet (25 mg total) by mouth 2 (two) times daily with a meal.  60 tablet  1  . doxycycline (VIBRA-TABS) 100 MG tablet Take 100 mg by mouth 2 (two) times daily.       . furosemide (LASIX) 80 MG tablet Take 1 tablet (80 mg total) by mouth 2 (two) times daily.   60 tablet  1  . hydrALAZINE (APRESOLINE) 10 MG tablet Take 1 tablet (10 mg total) by mouth every 6 (six) hours.  120 tablet  1  . insulin detemir (LEVEMIR) 100 UNIT/ML injection Inject 0.23 mLs (23 Units total) into the skin at bedtime.  10 mL  12  . lisinopril (PRINIVIL,ZESTRIL) 5 MG tablet Take 1 tablet (5 mg total) by mouth daily.  30 tablet  1  . oxyCODONE 10 MG TABS Take 1 tablet (10 mg total) by mouth every 4 (four) hours as needed.  90 tablet  0  . oxyCODONE-acetaminophen (PERCOCET) 10-325 MG per tablet Take 1 tablet by mouth every 4 (four) hours as needed for pain.  60 tablet  0  . pantoprazole (PROTONIX) 40 MG tablet Take 1 tablet (40 mg total) by mouth daily.  30 tablet  1  . polyethylene glycol (MIRALAX / GLYCOLAX) packet Take 17 g by mouth daily.      Marland Kitchen senna-docusate (SENOKOT-S) 8.6-50 MG per tablet Take 1 tablet by mouth 2 (two) times daily.      . simvastatin (ZOCOR) 40 MG tablet Take 1 tablet (40 mg total) by mouth every evening.  30 tablet  1  . tamsulosin (FLOMAX) 0.4 MG CAPS capsule Take 1 capsule (0.4 mg total) by mouth daily after supper.  30 capsule  1  . aspirin EC 81 MG tablet Take 1 tablet (81 mg total) by mouth daily.       No current facility-administered medications for this visit.    Allergies  Allergen Reactions  . Amoxicillin Nausea And Vomiting    Past Medical History  Diagnosis Date  . Hypertension   . Diabetes mellitus without complication ~2000    last HbA1c ~9  . Hypercholesteremia   . CVA (cerebral infarction) 2011    R hand deficit  . Chronic sinusitis   . Allergic rhinitis   . Chronic cough   . Complication of anesthesia     couldn't swallow and talk  . Stroke     Past Surgical History  Procedure Laterality Date  . 4th toe amputation Left Jan. 2014    4th toe in [place]  . Tonsillectomy and adenoidectomy    . Penile prosthesis implant    . I&d extremity Left 12/08/2012    Procedure: IRRIGATION AND DEBRIDEMENT EXTREMITY;  Surgeon:  Kathryne Hitch, MD;  Location: [facility] OR;  Service: Orthopedics;  Laterality: Left;  . Amputation Left 12/08/2012    Procedure: AMPUTATION DIGIT;  Surgeon: Kathryne Hitch, MD;  Location: [facility] OR;  Service: Orthopedics;  Laterality: Left;  3rd toe amputation possible 5th toe amputation  . I&d extremity Left 12/11/2012    Procedure: REPEAT I&D LEFT FOOT;  Surgeon: Kathryne Hitch, MD;  Location: [facility] OR;  Service: Orthopedics;  Laterality: Left;  . Amputation Left 12/11/2012    Procedure: Amputation of 2nd Toe;  Surgeon: Kathryne Hitch, MD;  Location: [facility] OR;  Service: Orthopedics;  Laterality: Left;  .  Bypass graft popliteal to tibial Left 12/13/2012    Procedure: BYPASS GRAFT POPLITEAL TO TIBIAL;  Surgeon: Nada Libman, MD;  Location: [facility] OR;  Service: Vascular;  Laterality: Left;  Left Popliteal to Posterior Tibial Bypass Graft with reversed saphenous vein graft  . Incision and drainage Right 06/25/2013    Procedure: INCISION AND DRAINAGE RIGHT FOOT;  Surgeon: Kathryne Hitch, MD;  Location: [facility] ORS;  Service: Orthopedics;  Laterality: Right;  . Amputation Right 06/29/2013    Procedure: AMPUTATION BELOW KNEE;  Surgeon: Nadara Mustard, MD;  Location: [facility] OR;  Service: Orthopedics;  Laterality: Right;  . Tee without cardioversion [name] 07/03/2013    Procedure: TRANSESOPHAGEAL ECHOCARDIOGRAM (TEE);  Surgeon: Laurey Morale, MD;  Location: [facility] ENDOSCOPY;  Service: Cardiovascular;  Laterality: [name];  . Stump revision Right 08/16/2013    Procedure: STUMP REVISION- right Below Knee Amputation;  Surgeon: Nadara Mustard, MD;  Location: [facility] OR;  Service: Orthopedics;  Laterality: Right;    History  Smoking status  . Never Smoker   Smokeless tobacco  . Never Used    History  Alcohol Use No    Family History  Problem Relation Age of Onset  . Diabetes    . Pancreatic cancer Mother   . Diabetes Mother   . Hyperlipidemia Mother   . Breast cancer Sister   . Depression Maternal  Grandmother   . Heart disease Maternal Grandmother   . Depression Maternal Grandfather   . Heart disease Maternal Grandfather   . Depression Paternal Grandmother   . Heart disease Paternal Grandmother   . Depression Paternal Grandfather   . Heart disease Paternal Grandfather     Review of Systems: The review of systems is per the HPI.  All other systems were reviewed and are negative.  Physical Exam: BP 120/90  Pulse 95  Ht 5\' 11"  (1.803 m)  Wt 271 lb (122.925 kg)  BMI 37.81 kg/m2  SpO2 100% BP is 130/82 by me.   He appears to be chronically ill but pleasant and in no acute distress. He is in a wheelchair. Skin is warm and dry. Color is quite sallow.   HEENT is unremarkable. Normocephalic/atraumatic. PERRL. Sclera are nonicteric. Neck is supple. No masses. No JVD. Lungs are clear. Cardiac exam shows a regular rate and rhythm. Systolic murmur noted. Abdomen is soft. Left leg with over 2+ edema. Right BKA with dressing over the stump noted. Gait not tested. No gross neurologic deficits noted.  LABORATORY DATA: PENDING  Lab Results  Component Value Date   WBC 11.0* 08/16/2013   HGB 7.8* 08/16/2013   HCT 25.6* 08/16/2013   PLT 217 08/16/2013   GLUCOSE 160* 08/16/2013   CHOL 91 12/08/2012   TRIG 73 12/08/2012   HDL 22* 12/08/2012   LDLCALC 54 12/08/2012   ALT 14 08/16/2013   AST 11 08/16/2013   NA 140 08/16/2013   K 3.9 08/16/2013   CL 101 08/16/2013   CREATININE 1.30 08/16/2013   BUN 26* 08/16/2013   CO2 27 08/16/2013   INR 1.20 08/16/2013   HGBA1C 8.2* 06/25/2013    Procedure Performed:  1. Left Heart Catheterization 2. Selective Coronary Angiography 3. Left ventricular angiogram Operator: Verne Carrow, MD  Indication: 52 yo WM with uncontrolled DM and HTN, severe PAD admitted with septic shock secondary to necrotic right foot. No chest pain but troponin elevated in setting of profound hypotension. Plans for BKA on right but cardiac cath to exclude CAD before BKA.  Procedure Details:  The risks, benefits, complications, treatment options, and expected outcomes were discussed with the patient. The patient and/or family concurred with the proposed plan, giving informed consent. The patient was brought to the cath lab after IV hydration was begun and oral premedication was given. The patient was further sedated with Versed and Fentanyl. The left groin was prepped and draped in the usual manner. Using the modified Seldinger access technique, a 5 French sheath was placed in the left femoral artery. Standard diagnostic catheters were used to perform selective coronary angiography. A pigtail catheter was used to perform a left ventricular angiogram.  There were no immediate complications. The patient was taken to the recovery area in stable condition.  Hemodynamic Findings:  Central aortic pressure: 90/61  Left ventricular pressure: 90/19/29  Angiographic Findings:  Left main: Short segment without obstruction.  Left Anterior Descending Artery: Moderate caliber diffusely diseased vessel that courses to the apex. The proximal vessel has diffuse 40% stenosis. The mid vessel has a 70% stenosis followed by a 60% stenosis. The distal vessel has diffuse 90% stenosis. The diagonal is a moderate caliber vessel with ostial 99% stenosis with 99% disease extending into the mid vessel.  Circumflex Artery: Large caliber vessel with proximal 80-90% stenosis. There are three small to moderate caliber obtuse marginal branches. The first OM has mid 80% stenosis. The second OM has a proximal 99% stenosis. The third OM has diffuse 99% stenosis. The left sided posterolateral branch has 99% stenosis.  Right Coronary Artery: Small non-dominant vessel with mid 99% stenosis.  Left Ventricular Angiogram: LVEF=40%  Impression:  1. Triple vessel CAD  2. NSTEMI secondary to demand ischemia from sepsis with severe underlying CAD  3. Mild LV systolic dysfunction  Recommendations: Complex  situation. The only option for coronary revascularization is CABG. He is not a favorable candidate for CABG at this time with necrotic right foot, sepsis. Even though not ideal, would favor medical management of CAD while undergoing treatment of his necrotic foot. Would proceed with right BKA this weekend. I have discussed the case with CT surgery who agrees that CABG is not a good option at this time. Will ask CT surgery to see after he recovers from his BKA. Continue ASA, beta blocker, statin.  Complications: None. The patient tolerated the procedure well.   Assessment / Plan: 1. NSTEMI with severe 3VD with MR as well - currently not an operative candidate - I doubt he will ever be an operative candidate. No current chest pain.   2. Ischemic CM - with EF 40% - lots of swelling in the left leg - check labs today and include BNP. On fairly good dose of diuretic. Taking his hydralazine TID - will change to TID. Hope to increase in the future.   3. Cough - ?ace related - will stop and place on Losartan 25 mg a day   4. HTN - BP is 130/82 by me - ACE changed to ARB today  5. Anemia - he has had no labs since his discharge - will recheck today  6. Vomiting - ?etiology - check amylase/lipase - still has his gallbladder - ?if this is gastroparesis - unfortunately no PCP until February  7. PVD - with recent amputation - still with delayed healing and an ulcer on his left foot apparently  8. IDDM - uncontrolled  Will try to optimize his medicines. Check labs today. See him in 2 weeks. Overall prognosis looks quite tenuous to me.   Patient is  agreeable to this plan and will call if any problems develop in the interim.   Rosalio Macadamia, RN, ANP-C [facility] [address] [place], [place]  [address]

## 2013-10-01 NOTE — Patient Instructions (Signed)
Stop the Lisonopril  Start Losartan 25 mg daily (this is in the place of the Lisinopril) - this is at the drug store  Take the hydralazine just 3 times a day  We need to check labs today  I will see you in 2 weeks  Call the [facility] office at [phone] if you have any questions, problems or concerns.

## 2013-10-04 ENCOUNTER — Telehealth: Payer: Self-pay | Admitting: Nurse Practitioner

## 2013-10-04 NOTE — Telephone Encounter (Signed)
New Problem: ° °Pt is requesting a call back with his recent test results. °

## 2013-10-08 DIAGNOSIS — Z0279 Encounter for issue of other medical certificate: Secondary | ICD-10-CM

## 2013-10-10 ENCOUNTER — Other Ambulatory Visit: Payer: Self-pay | Admitting: Physical Medicine & Rehabilitation

## 2013-10-15 ENCOUNTER — Encounter: Payer: Self-pay | Admitting: Nurse Practitioner

## 2013-10-15 ENCOUNTER — Ambulatory Visit (INDEPENDENT_AMBULATORY_CARE_PROVIDER_SITE_OTHER): Payer: 59 | Admitting: Nurse Practitioner

## 2013-10-15 VITALS — BP 138/90 | HR 72 | Ht 71.0 in

## 2013-10-15 DIAGNOSIS — R112 Nausea with vomiting, unspecified: Secondary | ICD-10-CM

## 2013-10-15 DIAGNOSIS — I259 Chronic ischemic heart disease, unspecified: Secondary | ICD-10-CM

## 2013-10-15 NOTE — Patient Instructions (Signed)
We will get an abdominal ultrasound  We are sending you to the GI doctor  See me in 3 to 4 weeks on a day that Dr. Swaziland is here  Stay on your current medicines  Call the [facility] office at [phone] if you have any questions, problems or concerns.

## 2013-10-15 NOTE — Progress Notes (Signed)
Durell Daniel Nones Date of Birth: [birth year] Medical Record #[id number]  History of Present Illness: Mr. Eisenhardt is seen back today for a 2 week check. Seen for Dr. [place]. He is a 52 year old male with multiple medical issues. These include poorly controlled DM, SIRS/MSSA bacteremia from a gangrenous right foot with subsequent right BKA back in September of 2014. During his acute illness he got hypotensive and had positive troponins - was cathed - has severe 3VD with an EF of 40%. TEE showed moderate MR. Other issues include HLD, CVA, and HTN.   Seen in November by Dr. Dorris Fetch for consideration of CABG - he referred Mr. Dicenzo back here - his thoughts are as follow:   Impression:  52 year old gentleman with multiple severe medical problems including poorly controlled, complicated diabetes, peripheral vascular disease, and previous stroke. During a hospitalization for sepsis due to gangrene of his right foot he had a non-Q-wave MI. Cardiac catheterization revealed a left dominant circulation with three-vessel disease equivalent. Echocardiogram was reported his ejection fraction in the 40-50% range. He also has moderate mitral regurgitation.  The ideal treatment for his cardiac disease would be coronary bypass grafting, with possible mitral valve annuloplasty depending on intraoperative findings. This would be an extremely high-risk endeavor in this individual, and I do not think he is a candidate for that surgery currently. He only recently has had his below-knee amputation stump revised and has not completely healed. He remains severely debilitated.  Should his overall condition improved dramatically, we could consider coronary bypass grafting. However even then it would be a difficult situation given the limitations of available conduit. He is not a candidate for bilateral mammaries due to his severe diabetes. He has severe edema in both lower extremities and I suspect his saphenous vein will be  marginal at best. He has had peripheral bypass below the knee on the left side. Wound healing likely will be problematic at best. He has an equivocal Allen's test on the left hand and given his right arm weakness, the left hand is his dominant. Pulse in the right arm is weak.  Overall I think his best option at this point in time his medical therapy. We will refer him back to his cardiologist. I will plan to see him back after the first of the year to see if he has made sufficient progress that surgery is a consideration.   I saw him 2 weeks ago - multiple issues - which we tried to address. ACE changed to ARB due to cough. Hydralazine increased as well.   Comes back today - here with his wife. He has done ok over the past 2 weeks says "I'm still alive". Seeing Dr. Lajoyce Corners later today. Right BKA stump healing. Says the ulcer on the left foot has improved as well. Remains on antibiotics. Still with belly issues. His belly will hurt in a generalized fashion after eating and after taking medicines. Still throwing up as well. Still with a cough. Not able to weigh at all. BP is about the same at home.   Current Outpatient Prescriptions  Medication Sig Dispense Refill  . ALPRAZolam (XANAX) 0.25 MG tablet Take 1 tablet (0.25 mg total) by mouth 2 (two) times daily as needed for anxiety.  30 tablet  0  . aspirin EC 81 MG tablet Take 1 tablet (81 mg total) by mouth daily.      . carvedilol (COREG) 25 MG tablet Take 1 tablet (25 mg total) by mouth 2 (two)  times daily with a meal.  60 tablet  1  . doxycycline (VIBRA-TABS) 100 MG tablet Take 100 mg by mouth 2 (two) times daily.       . furosemide (LASIX) 80 MG tablet Take 1 tablet (80 mg total) by mouth 2 (two) times daily.  60 tablet  1  . hydrALAZINE (APRESOLINE) 10 MG tablet Take 1 tablet (10 mg total) by mouth 3 (three) times daily.  120 tablet  1  . insulin detemir (LEVEMIR) 100 UNIT/ML injection Inject 0.23 mLs (23 Units total) into the skin at bedtime.  10 mL   12  . losartan (COZAAR) 25 MG tablet Take 1 tablet (25 mg total) by mouth daily.  30 tablet  3  . oxyCODONE 10 MG TABS Take 1 tablet (10 mg total) by mouth every 4 (four) hours as needed.  90 tablet  0  . oxyCODONE-acetaminophen (PERCOCET) 10-325 MG per tablet Take 1 tablet by mouth every 4 (four) hours as needed for pain.  60 tablet  0  . pantoprazole (PROTONIX) 40 MG tablet Take 1 tablet (40 mg total) by mouth daily.  30 tablet  1  . polyethylene glycol (MIRALAX / GLYCOLAX) packet Take 17 g by mouth daily.      Marland Kitchen senna-docusate (SENOKOT-S) 8.6-50 MG per tablet Take 1 tablet by mouth 2 (two) times daily.      . simvastatin (ZOCOR) 40 MG tablet Take 1 tablet (40 mg total) by mouth every evening.  30 tablet  1  . tamsulosin (FLOMAX) 0.4 MG CAPS capsule Take 1 capsule (0.4 mg total) by mouth daily after supper.  30 capsule  1   No current facility-administered medications for this visit.    Allergies  Allergen Reactions  . Amoxicillin Nausea And Vomiting    Past Medical History  Diagnosis Date  . Hypertension   . Diabetes mellitus without complication ~2000    last HbA1c ~9  . Hypercholesteremia   . CVA (cerebral infarction) 2011    R hand deficit  . Chronic sinusitis   . Allergic rhinitis   . Chronic cough   . Complication of anesthesia     couldn't swallow and talk  . Stroke     Past Surgical History  Procedure Laterality Date  . 4th toe amputation Left Jan. 2014    4th toe in [place]  . Tonsillectomy and adenoidectomy    . Penile prosthesis implant    . I&d extremity Left 12/08/2012    Procedure: IRRIGATION AND DEBRIDEMENT EXTREMITY;  Surgeon: Kathryne Hitch, MD;  Location: [facility] OR;  Service: Orthopedics;  Laterality: Left;  . Amputation Left 12/08/2012    Procedure: AMPUTATION DIGIT;  Surgeon: Kathryne Hitch, MD;  Location: [facility] OR;  Service: Orthopedics;  Laterality: Left;  3rd toe amputation possible 5th toe amputation  . I&d extremity Left 12/11/2012     Procedure: REPEAT I&D LEFT FOOT;  Surgeon: Kathryne Hitch, MD;  Location: [facility] OR;  Service: Orthopedics;  Laterality: Left;  . Amputation Left 12/11/2012    Procedure: Amputation of 2nd Toe;  Surgeon: Kathryne Hitch, MD;  Location: [facility] OR;  Service: Orthopedics;  Laterality: Left;  . Bypass graft popliteal to tibial Left 12/13/2012    Procedure: BYPASS GRAFT POPLITEAL TO TIBIAL;  Surgeon: Nada Libman, MD;  Location: [facility] OR;  Service: Vascular;  Laterality: Left;  Left Popliteal to Posterior Tibial Bypass Graft with reversed saphenous vein graft  . Incision and drainage Right 06/25/2013    Procedure:  INCISION AND DRAINAGE RIGHT FOOT;  Surgeon: Kathryne Hitch, MD;  Location: [facility] ORS;  Service: Orthopedics;  Laterality: Right;  . Amputation Right 06/29/2013    Procedure: AMPUTATION BELOW KNEE;  Surgeon: Nadara Mustard, MD;  Location: [facility] OR;  Service: Orthopedics;  Laterality: Right;  . Tee without cardioversion [name] 07/03/2013    Procedure: TRANSESOPHAGEAL ECHOCARDIOGRAM (TEE);  Surgeon: Laurey Morale, MD;  Location: [facility] ENDOSCOPY;  Service: Cardiovascular;  Laterality: [name];  . Stump revision Right 08/16/2013    Procedure: STUMP REVISION- right Below Knee Amputation;  Surgeon: Nadara Mustard, MD;  Location: [facility] OR;  Service: Orthopedics;  Laterality: Right;    History  Smoking status  . Never Smoker   Smokeless tobacco  . Never Used    History  Alcohol Use No    Family History  Problem Relation Age of Onset  . Diabetes    . Pancreatic cancer Mother   . Diabetes Mother   . Hyperlipidemia Mother   . Breast cancer Sister   . Depression Maternal Grandmother   . Heart disease Maternal Grandmother   . Depression Maternal Grandfather   . Heart disease Maternal Grandfather   . Depression Paternal Grandmother   . Heart disease Paternal Grandmother   . Depression Paternal Grandfather   . Heart disease Paternal Grandfather     Review of Systems: The review of systems is  per the HPI.  All other systems were reviewed and are negative.  Physical Exam: BP 138/90  Pulse 72  Ht 5\' 11"  (1.803 m)  Wt 260 lb (117.935 kg)  BMI 36.28 kg/m2 Patient is very pleasant and in no acute distress. Still looks chronically ill but looks a little better today. Skin is warm and dry. Color is normal.  HEENT is unremarkable. Normocephalic/atraumatic. PERRL. Sclera are nonicteric. Neck is supple. No masses. No JVD. Lungs are clear. Cardiac exam shows a regular rate and rhythm. Rate still a little fast. Abdomen is soft. Right BKA. Left foot in an orthopedic boot. No gross neurologic deficits noted but has some weakness of the right arm.  LABORATORY DATA:  Lab Results  Component Value Date   WBC 6.5 10/01/2013   HGB 9.0* 10/01/2013   HCT 29.4* 10/01/2013   PLT 292.0 10/01/2013   GLUCOSE 189* 10/01/2013   CHOL 91 12/08/2012   TRIG 73 12/08/2012   HDL 22* 12/08/2012   LDLCALC 54 12/08/2012   ALT 13 10/01/2013   AST 13 10/01/2013   NA 137 10/01/2013   K 3.7 10/01/2013   CL 104 10/01/2013   CREATININE 0.9 10/01/2013   BUN 15 10/01/2013   CO2 24 10/01/2013   TSH 5.58* 10/01/2013   INR 1.20 08/16/2013   HGBA1C 8.2* 06/25/2013   Procedure Performed:  1. Left Heart Catheterization 2. Selective Coronary Angiography 3. Left ventricular angiogram Operator: Verne Carrow, MD  Indication: 52 yo WM with uncontrolled DM and HTN, severe PAD admitted with septic shock secondary to necrotic right foot. No chest pain but troponin elevated in setting of profound hypotension. Plans for BKA on right but cardiac cath to exclude CAD before BKA.  Procedure Details:  The risks, benefits, complications, treatment options, and expected outcomes were discussed with the patient. The patient and/or family concurred with the proposed plan, giving informed consent. The patient was brought to the cath lab after IV hydration was begun and oral premedication was given. The patient was further sedated  with Versed and Fentanyl. The left groin was prepped and draped  in the usual manner. Using the modified Seldinger access technique, a 5 French sheath was placed in the left femoral artery. Standard diagnostic catheters were used to perform selective coronary angiography. A pigtail catheter was used to perform a left ventricular angiogram.  There were no immediate complications. The patient was taken to the recovery area in stable condition.  Hemodynamic Findings:  Central aortic pressure: 90/61  Left ventricular pressure: 90/19/29  Angiographic Findings:  Left main: Short segment without obstruction.  Left Anterior Descending Artery: Moderate caliber diffusely diseased vessel that courses to the apex. The proximal vessel has diffuse 40% stenosis. The mid vessel has a 70% stenosis followed by a 60% stenosis. The distal vessel has diffuse 90% stenosis. The diagonal is a moderate caliber vessel with ostial 99% stenosis with 99% disease extending into the mid vessel.  Circumflex Artery: Large caliber vessel with proximal 80-90% stenosis. There are three small to moderate caliber obtuse marginal branches. The first OM has mid 80% stenosis. The second OM has a proximal 99% stenosis. The third OM has diffuse 99% stenosis. The left sided posterolateral branch has 99% stenosis.  Right Coronary Artery: Small non-dominant vessel with mid 99% stenosis.  Left Ventricular Angiogram: LVEF=40%  Impression:  1. Triple vessel CAD  2. NSTEMI secondary to demand ischemia from sepsis with severe underlying CAD  3. Mild LV systolic dysfunction  Recommendations: Complex situation. The only option for coronary revascularization is CABG. He is not a favorable candidate for CABG at this time with necrotic right foot, sepsis. Even though not ideal, would favor medical management of CAD while undergoing treatment of his necrotic foot. Would proceed with right BKA this weekend. I have discussed the case with CT surgery who agrees  that CABG is not a good option at this time. Will ask CT surgery to see after he recovers from his BKA. Continue ASA, beta blocker, statin.  Complications: None. The patient tolerated the procedure well.    Assessment / Plan:  1. NSTEMI with severe 3VD with MR as well - No current chest pain. Seeing Dr. Dorris Fetch next week - I suspect medical therapy will be advised.   2. Ischemic CM - with EF 40% - seems fairly compensated. Not able to stand to weigh.  3. Cough - ?ace related (or from his vomiting) - now on Losartan 25 mg a day but still coughing  4. HTN   5. Anemia - hgb 9 on last check  6. Vomiting - ?if this is gastroparesis or his gallbladder - will arrange for abdominal ultrasound and refer to GI - unfortunately no PCP until February  7. PVD - with recent amputation - seeing Dr. Lajoyce Corners later today  8. IDDM - uncontrolled   Overall prognosis looks quite tenuous to me. I am not sure how much of his medicine he is actually absorbing given his GI symptoms. Will check abdominal ultrasound, refer to GI. See him back in a month and hope to start titrating medicines then.   Patient is agreeable to this plan and will call if any problems develop in the interim.   Rosalio Macadamia, RN, ANP-C  [facility]  [address]  [place], [place] [address]

## 2013-10-18 ENCOUNTER — Ambulatory Visit: Payer: 59 | Admitting: Physician Assistant

## 2013-10-18 ENCOUNTER — Encounter: Payer: Self-pay | Admitting: Physician Assistant

## 2013-10-18 ENCOUNTER — Ambulatory Visit (HOSPITAL_COMMUNITY)
Admission: RE | Admit: 2013-10-18 | Discharge: 2013-10-18 | Disposition: A | Discharge status: Home / Self Care | Payer: 59 | Source: Ambulatory Visit | Attending: Nurse Practitioner | Admitting: Nurse Practitioner

## 2013-10-18 ENCOUNTER — Other Ambulatory Visit (INDEPENDENT_AMBULATORY_CARE_PROVIDER_SITE_OTHER): Payer: 59

## 2013-10-18 ENCOUNTER — Ambulatory Visit (INDEPENDENT_AMBULATORY_CARE_PROVIDER_SITE_OTHER): Payer: 59 | Admitting: Physician Assistant

## 2013-10-18 VITALS — BP 140/90 | HR 60

## 2013-10-18 DIAGNOSIS — R109 Unspecified abdominal pain: Secondary | ICD-10-CM | POA: Insufficient documentation

## 2013-10-18 DIAGNOSIS — R112 Nausea with vomiting, unspecified: Secondary | ICD-10-CM

## 2013-10-18 DIAGNOSIS — J90 Pleural effusion, not elsewhere classified: Secondary | ICD-10-CM | POA: Insufficient documentation

## 2013-10-18 DIAGNOSIS — K828 Other specified diseases of gallbladder: Secondary | ICD-10-CM | POA: Insufficient documentation

## 2013-10-18 LAB — COMPREHENSIVE METABOLIC PANEL
ALBUMIN: 3.8 g/dL (ref 3.5–5.2)
ALT: 10 U/L (ref 0–53)
AST: 11 U/L (ref 0–37)
Alkaline Phosphatase: 53 U/L (ref 39–117)
BUN: 18 mg/dL (ref 6–23)
CO2: 26 mEq/L (ref 19–32)
Calcium: 8.8 mg/dL (ref 8.4–10.5)
Chloride: 104 mEq/L (ref 96–112)
Creatinine, Ser: 0.9 mg/dL (ref 0.4–1.5)
GFR: 98.37 mL/min (ref >60.00)
GLUCOSE: 170 mg/dL — AB (ref 70–99)
POTASSIUM: 3.1 meq/L — AB (ref 3.5–5.1)
SODIUM: 138 meq/L (ref 135–145)
TOTAL PROTEIN: 6.5 g/dL (ref 6.0–8.3)
Total Bilirubin: 0.7 mg/dL (ref 0.3–1.2)

## 2013-10-18 LAB — CBC WITH DIFFERENTIAL/PLATELET
Basophils Absolute: 0 10*3/uL (ref 0.0–0.1)
Basophils Relative: 0.4 % (ref 0.0–3.0)
EOS ABS: 0.1 10*3/uL (ref 0.0–0.7)
Eosinophils Relative: 1.7 % (ref 0.0–5.0)
HCT: 29.9 % — ABNORMAL LOW (ref 39.0–52.0)
HEMOGLOBIN: 9.4 g/dL — AB (ref 13.0–17.0)
Lymphocytes Relative: 20.5 % (ref 12.0–46.0)
Lymphs Abs: 1.5 10*3/uL (ref 0.7–4.0)
MCHC: 31.3 g/dL (ref 30.0–36.0)
Monocytes Absolute: 0.6 10*3/uL (ref 0.1–1.0)
Monocytes Relative: 7.8 % (ref 3.0–12.0)
NEUTROS PCT: 69.6 % (ref 43.0–77.0)
Neutro Abs: 5.1 10*3/uL (ref 1.4–7.7)
Platelets: 312 10*3/uL (ref 150.0–400.0)
RBC: 4.46 Mil/uL (ref 4.22–5.81)
RDW: 19.3 % — ABNORMAL HIGH (ref 11.5–14.6)
WBC: 7.4 10*3/uL (ref 4.5–10.5)

## 2013-10-18 MED ORDER — PROMETHAZINE HCL 25 MG PO TABS: ORAL_TABLET | ORAL | Status: DC

## 2013-10-18 NOTE — Patient Instructions (Signed)
Please go to the basement level to have your labs drawn.  We sent a prescription to [address] for Phenergan ( Promethazine ) 25 mg.  Stop the Oxycodone and all pain meds for now.  Continue the Protonix 40 mg once daily. It is ok to take the heart medication with meals.  Call us back in 5-6 days with a progress report. If you are no better Amy will see you again in the office.

## 2013-10-18 NOTE — Progress Notes (Addendum)
Subjective:    Patient ID: Albert [name], male    DOB: 02-12-1961, 53 y.o.   MRN: [id number]  HPI  Tex is a pleasant 53 year old white male new to GI today referred by cardiology. He has had a complicated course over the past few months. He has poorly controlled insulin-dependent diabetes mellitus had an episode of serves/MS SA bacteremia from a gangrenous right foot in September of 2014 and this was followed by a right BKA. During that acute illness he had hypotension and positive troponins and was cathetered only to reveal severe 3 vessel disease with anemia off of 40%. He has also had a CVA and has history of hypertension. He was seen by CT S. for consideration of a CABG but was felt to be a poor candidate currently and therefore medical therapy is being offered. Patient says over the past 3 months he has had problems with daily nausea and vomiting. He seems to think that this is brought on by taking handfuls of pills. He says he very frequently wakes up nauseated and wall didn't vomit this and is he gets out of bed. His appetite overall has been decreased and he does have symptoms of early satiety. He really denies any abdominal pain other than soreness from vomiting. Is not having any problems with diarrhea melena or hematochezia. No fever or chills. No real heartburn or indigestion. He is able intermittently to keep food down but has intermittent episodes of vomiting after meals as well. He had been taking hydrocodone fairly regularly but says he has been able to back off on this recently. He is on Protonix 40 mg once daily denies any regular NSAID use currently. Most recent labs done on 10/01/2013 show WBC of 6.5 hemoglobin 9 hematocrit of 29.4 MCV of 70 platelets 292 creatinine was 0.9. Patient underwent upper or Donald ultrasound earlier this morning which showed a contracted gallbladder but no wall thickening or gallstones and was otherwise negative ultrasound.     Review of Systems    Constitutional: Positive for fatigue.  HENT: Positive for postnasal drip.   Eyes: Negative.   Respiratory: Negative.   Cardiovascular: Negative.   Gastrointestinal: Positive for nausea and vomiting.  Endocrine: Negative.   Genitourinary: Negative.   Musculoskeletal: Positive for arthralgias and gait problem.  Skin: Negative.   Allergic/Immunologic: Negative.   Neurological: Negative.   Hematological: Negative.   Psychiatric/Behavioral: Negative.    Outpatient Prescriptions Prior to Visit  Medication Sig Dispense Refill  . ALPRAZolam (XANAX) 0.25 MG tablet Take 1 tablet (0.25 mg total) by mouth 2 (two) times daily as needed for anxiety.  30 tablet  0  . carvedilol (COREG) 25 MG tablet Take 1 tablet (25 mg total) by mouth 2 (two) times daily with a meal.  60 tablet  1  . furosemide (LASIX) 80 MG tablet Take 1 tablet (80 mg total) by mouth 2 (two) times daily.  60 tablet  1  . hydrALAZINE (APRESOLINE) 10 MG tablet Take 1 tablet (10 mg total) by mouth 3 (three) times daily.  120 tablet  1  . insulin detemir (LEVEMIR) 100 UNIT/ML injection Inject 0.23 mLs (23 Units total) into the skin at bedtime.  10 mL  12  . losartan (COZAAR) 25 MG tablet Take 1 tablet (25 mg total) by mouth daily.  30 tablet  3  . oxyCODONE 10 MG TABS Take 1 tablet (10 mg total) by mouth every 4 (four) hours as needed.  90 tablet  0  .  oxyCODONE-acetaminophen (PERCOCET) 10-325 MG per tablet Take 1 tablet by mouth every 4 (four) hours as needed for pain.  60 tablet  0  . pantoprazole (PROTONIX) 40 MG tablet Take 1 tablet (40 mg total) by mouth daily.  30 tablet  1  . polyethylene glycol (MIRALAX / GLYCOLAX) packet Take 17 g by mouth daily.      Marland Kitchen senna-docusate (SENOKOT-S) 8.6-50 MG per tablet Take 1 tablet by mouth 2 (two) times daily.      . simvastatin (ZOCOR) 40 MG tablet Take 1 tablet (40 mg total) by mouth every evening.  30 tablet  1  . tamsulosin (FLOMAX) 0.4 MG CAPS capsule Take 1 capsule (0.4 mg total) by mouth  daily after supper.  30 capsule  1  . aspirin EC 81 MG tablet Take 1 tablet (81 mg total) by mouth daily.      Marland Kitchen doxycycline (VIBRA-TABS) 100 MG tablet Take 100 mg by mouth 2 (two) times daily.        No facility-administered medications prior to visit.   Allergies  Allergen Reactions  . Amoxicillin Nausea And Vomiting        Patient Active Problem List   Diagnosis Date Noted  . Acute systolic CHF (congestive heart failure) 08/03/2013  . Hx of right BKA 07/26/2013  . Anemia 07/15/2013  . Hyperkalemia 07/15/2013  . Hyposmolality and/or hyponatremia 07/15/2013  . Acute respiratory failure 07/15/2013  . Mitral regurgitation 07/15/2013  . Oliguria 07/15/2013  . Bacteremia due to Staphylococcus aureus 07/15/2013  . Hyperchloremia 07/15/2013  . Acute kidney failure 07/14/2013  . Hypotension, unspecified 07/14/2013  . Acute systolic heart failure 57/26/2035  . Acute pulmonary edema 07/11/2013  . Pneumonia, organism unspecified 07/11/2013  . Hypoxemia 07/11/2013  . Hx of BKA 07/04/2013  . Cardiomyopathy, ischemic 07/03/2013  . Coronary atherosclerosis of native coronary artery 07/01/2013  . NSTEMI (non-ST elevated myocardial infarction) 06/26/2013  . AKI (acute kidney injury) 06/25/2013  . Foot abscess, right 06/25/2013  . Shock circulatory 06/25/2013  . Dehydration with hyponatremia 06/25/2013  . Leukocytosis, unspecified 06/25/2013  . Sepsis(995.91) 06/25/2013  . Aftercare following surgery of the circulatory system, [place] 02/11/2013  . Peripheral vascular disease, unspecified 12/31/2012  . Diabetes mellitus out of control 12/17/2012  . Essential hypertension, benign 12/17/2012  . Atherosclerotic peripheral vascular disease 12/17/2012  . Gangrene 12/08/2012  . Hypercholesteremia    History  Substance Use Topics  . Smoking status: Never Smoker   . Smokeless tobacco: Never Used  . Alcohol Use: No   family history includes Breast cancer in his sister; Depression in his  maternal grandfather, maternal grandmother, paternal grandfather, and paternal grandmother; Diabetes in his mother and another family member; Heart disease in his maternal grandfather, maternal grandmother, paternal grandfather, and paternal grandmother; Hyperlipidemia in his mother; Pancreatic cancer in his mother.  Objective:   Physical Exam well-developed chronically ill-appearing white male in no acute distress, he is in a wheelchair and is status post BKA on the right. Blood pressure 140/90 pulse 60. HEENT; nontraumatic normocephalic EOMI PERRLA sclera anicteric, Supple; no JVD, Cardiovascular; regular rate and rhythm with S1-S2 no murmur or gallop, Pulmonary ;clear bilaterally, Abdomen ;soft basically nontender there is no palpable mass or hepatosplenomegaly no succussion splash, Rectal; exam not done, Extremities; status post recent right BKA, Psych; mood and affect normal and appropriate        Assessment & Plan:  #74  53 year old male insulin-dependent diabetic with diabetes x15 years presenting with 3 month history of  nausea and intermittent vomiting and frequent episodes of early morning nausea and vomiting.  Suspect he has diabetic gastroparesis perhaps also being exacerbated by oral analgesics. Rule out peptic ulcer disease #2 recent hospitalization with [place] and MSSA bacteremia September 2014 secondary to a gangrenous right foot now status post right BKA #3 severe three-vessel coronary artery disease-currently felt to be a poor candidate for CABG #4 history of CVA #5 hypertension  Plan; since ultrasound is unrevealing will start with management of gastroparesis. Have asked him to stop taking all pain medications. Start Phenergan 12.5-25 mg every 6 hours when necessary nausea which she has taken in the past and find helpful Continue Protonix 40 mg by mouth daily Schedule for CBC with differential and be met today Small frequent low roughage feedings Patient was told he may take his  cardiac meds with food, and to try taking Phenergan half an hour before meals. He is asked to call in one week with a progress report if he is better continue current management if not he will need EGD and possibly gastric emptying scan  Addendum: Reviewed and agree with initial management. Jerene Bears, MD

## 2013-10-22 ENCOUNTER — Other Ambulatory Visit: Payer: Self-pay | Admitting: *Deleted

## 2013-10-22 ENCOUNTER — Ambulatory Visit: Payer: 59 | Admitting: Thoracic Surgery (Cardiothoracic Vascular Surgery)

## 2013-10-22 DIAGNOSIS — D649 Anemia, unspecified: Secondary | ICD-10-CM

## 2013-10-22 NOTE — Telephone Encounter (Signed)
Follow Up ° °Pt returned call for results//SR  °

## 2013-11-04 ENCOUNTER — Encounter: Payer: Self-pay | Admitting: *Deleted

## 2013-11-15 ENCOUNTER — Ambulatory Visit: Payer: 59 | Admitting: Internal Medicine

## 2013-11-18 ENCOUNTER — Ambulatory Visit: Payer: 59 | Admitting: Nurse Practitioner

## 2013-12-10 ENCOUNTER — Ambulatory Visit: Payer: 59 | Admitting: Nurse Practitioner

## 2014-01-01 ENCOUNTER — Ambulatory Visit: Payer: 59 | Attending: Orthopedic Surgery | Admitting: Physical Therapy

## 2014-01-01 DIAGNOSIS — Z4789 Encounter for other orthopedic aftercare: Secondary | ICD-10-CM | POA: Insufficient documentation

## 2014-01-01 DIAGNOSIS — M6281 Muscle weakness (generalized): Secondary | ICD-10-CM | POA: Insufficient documentation

## 2014-01-01 DIAGNOSIS — R269 Unspecified abnormalities of gait and mobility: Secondary | ICD-10-CM | POA: Insufficient documentation

## 2014-01-03 ENCOUNTER — Encounter: Payer: Self-pay | Admitting: Nurse Practitioner

## 2014-01-03 ENCOUNTER — Ambulatory Visit (INDEPENDENT_AMBULATORY_CARE_PROVIDER_SITE_OTHER): Payer: 59 | Admitting: Nurse Practitioner

## 2014-01-03 ENCOUNTER — Other Ambulatory Visit: Payer: Self-pay | Admitting: Nurse Practitioner

## 2014-01-03 VITALS — BP 148/78 | HR 64 | Ht 71.0 in | Wt 223.0 lb

## 2014-01-03 DIAGNOSIS — I259 Chronic ischemic heart disease, unspecified: Secondary | ICD-10-CM

## 2014-01-03 DIAGNOSIS — E7800 Pure hypercholesterolemia, unspecified: Secondary | ICD-10-CM

## 2014-01-03 DIAGNOSIS — I10 Essential (primary) hypertension: Secondary | ICD-10-CM

## 2014-01-03 LAB — CBC
HCT: 34.4 % — ABNORMAL LOW (ref 39.0–52.0)
Hemoglobin: 10.6 g/dL — ABNORMAL LOW (ref 13.0–17.0)
MCHC: 30.8 g/dL (ref 30.0–36.0)
MCV: 69.9 fl — ABNORMAL LOW (ref 78.0–100.0)
Platelets: 280 10*3/uL (ref 150.0–400.0)
RBC: 4.92 Mil/uL (ref 4.22–5.81)
RDW: 23.9 % — ABNORMAL HIGH (ref 11.5–14.6)
WBC: 7.9 10*3/uL (ref 4.5–10.5)

## 2014-01-03 LAB — BASIC METABOLIC PANEL
BUN: 19 mg/dL (ref 6–23)
CO2: 24 mEq/L (ref 19–32)
Calcium: 9 mg/dL (ref 8.4–10.5)
Chloride: 101 mEq/L (ref 96–112)
Creatinine, Ser: 1 mg/dL (ref 0.4–1.5)
GFR: 84.67 mL/min (ref >60.00)
Glucose, Bld: 148 mg/dL — ABNORMAL HIGH (ref 70–99)
Potassium: 3.4 mEq/L — ABNORMAL LOW (ref 3.5–5.1)
Sodium: 136 mEq/L (ref 135–145)

## 2014-01-03 LAB — TSH: TSH: 2.4 u[IU]/mL (ref 0.35–5.50)

## 2014-01-03 MED ORDER — CARVEDILOL 25 MG PO TABS: 37.5000 mg | ORAL_TABLET | Freq: Two times a day (BID) | ORAL | Status: DC

## 2014-01-03 MED ORDER — POTASSIUM CHLORIDE ER 10 MEQ PO TBCR: 10.0000 meq | EXTENDED_RELEASE_TABLET | Freq: Two times a day (BID) | ORAL | Status: DC

## 2014-01-03 NOTE — Progress Notes (Signed)
Albert [name] Date of Birth: 1960-12-15 Medical Record #[id number]  History of Present Illness: Albert [name] is seen back today for a follow up visit. This is a 3 month check. Seen for Albert [name]. He is a 53 year old male with multiple medical issues. These include poorly controlled DM, SIRS/MSSA bacteremia from a gangrenous right foot with subsequent right BKA back in September of 2014. During his acute illness he got hypotensive and had positive troponins - was cathed - has severe 3VD with an EF of 40%. TEE showed moderate MR. Other issues include HLD, CVA, and HTN.   Seen in November by Albert [name] for consideration of CABG - he referred Albert [name] back here - his thoughts were as follows:   Impression:  53 year old gentleman with multiple severe medical problems including poorly controlled, complicated diabetes, peripheral vascular disease, and previous stroke. During a hospitalization for sepsis due to gangrene of his right foot he had a non-Q-wave MI. Cardiac catheterization revealed a left dominant circulation with three-vessel disease equivalent. Echocardiogram was reported his ejection fraction in the 40-50% range. He also has moderate mitral regurgitation.  The ideal treatment for his cardiac disease would be coronary bypass grafting, with possible mitral valve annuloplasty depending on intraoperative findings. This would be an extremely high-risk endeavor in this individual, and I do not think he is a candidate for that surgery currently. He only recently has had his below-knee amputation stump revised and has not completely healed. He remains severely debilitated.  Should his overall condition improved dramatically, we could consider coronary bypass grafting. However even then it would be a difficult situation given the limitations of available conduit. He is not a candidate for bilateral mammaries due to his severe diabetes. He has severe edema in both lower extremities and I suspect his  saphenous vein will be marginal at best. He has had peripheral bypass below the knee on the left side. Wound healing likely will be problematic at best. He has an equivocal Allen's test on the left hand and given his right arm weakness, the left hand is his dominant. Pulse in the right arm is weak.  Overall I think his best option at this point in time his medical therapy. We will refer him back to his cardiologist. I will plan to see him back after the first of the year to see if he has made sufficient progress that surgery is a consideration.   I last saw him back in December - seemed to be stable.   Comes back today. Here with a family member. Has cancelled several appointments with [place], GI and with [facility] due to the bad weather. He has recently been fitted with his prosthesis - walking some. No chest pain. Not short of breath. Not dizzy or lightheaded. No means of checking his BP at home. No more vomiting. He is very happy that he can be more mobile.    Current Outpatient Prescriptions  Medication Sig Dispense Refill  . ALPRAZolam (XANAX) 0.25 MG tablet Take 1 tablet (0.25 mg total) by mouth 2 (two) times daily as needed for anxiety.  30 tablet  0  . carvedilol (COREG) 25 MG tablet Take 1 tablet (25 mg total) by mouth 2 (two) times daily with a meal.  60 tablet  1  . furosemide (LASIX) 80 MG tablet Take 1 tablet (80 mg total) by mouth 2 (two) times daily.  60 tablet  1  . hydrALAZINE (APRESOLINE) 10 MG tablet Take 1 tablet (10  mg total) by mouth 3 (three) times daily.  120 tablet  1  . insulin detemir (LEVEMIR) 100 UNIT/ML injection Inject 0.23 mLs (23 Units total) into the skin at bedtime.  10 mL  12  . losartan (COZAAR) 25 MG tablet Take 1 tablet (25 mg total) by mouth daily.  30 tablet  3  . oxyCODONE 10 MG TABS Take 1 tablet (10 mg total) by mouth every 4 (four) hours as needed.  90 tablet  0  . oxyCODONE-acetaminophen (PERCOCET) 10-325 MG per tablet Take 1 tablet by mouth every 4 (four) hours  as needed for pain.  60 tablet  0  . pantoprazole (PROTONIX) 40 MG tablet Take 1 tablet (40 mg total) by mouth daily.  30 tablet  1  . polyethylene glycol (MIRALAX / GLYCOLAX) packet Take 17 g by mouth daily.      . promethazine (PHENERGAN) 25 MG tablet Take 1 tab every 6-8 hours for nausea.  40 tablet  1  . senna-docusate (SENOKOT-S) 8.6-50 MG per tablet Take 1 tablet by mouth 2 (two) times daily.      . simvastatin (ZOCOR) 40 MG tablet Take 1 tablet (40 mg total) by mouth every evening.  30 tablet  1  . tamsulosin (FLOMAX) 0.4 MG CAPS capsule Take 1 capsule (0.4 mg total) by mouth daily after supper.  30 capsule  1   No current facility-administered medications for this visit.    Allergies  Allergen Reactions  . Amoxicillin Nausea And Vomiting    Past Medical History  Diagnosis Date  . Hypertension   . Diabetes mellitus without complication [id number]    last HbA1c ~9  . Hypercholesteremia   . CVA (cerebral infarction) 2011    R hand deficit  . Chronic sinusitis   . Allergic rhinitis   . Chronic cough   . Complication of anesthesia     couldn't swallow and talk  . Stroke     Past Surgical History  Procedure Laterality Date  . 4th toe amputation Left Jan. 2014    4th toe in [place]  . Tonsillectomy and adenoidectomy    . Penile prosthesis implant    . I&d extremity Left 12/08/2012    Procedure: IRRIGATION AND DEBRIDEMENT EXTREMITY;  Surgeon: Albert Rossetti, MD;  Location: [facility];  Service: Orthopedics;  Laterality: Left;  . Amputation Left 12/08/2012    Procedure: AMPUTATION DIGIT;  Surgeon: Albert Rossetti, MD;  Location: [facility];  Service: Orthopedics;  Laterality: Left;  3rd toe amputation possible 5th toe amputation  . I&d extremity Left 12/11/2012    Procedure: REPEAT I&D LEFT FOOT;  Surgeon: Albert Rossetti, MD;  Location: [facility];  Service: Orthopedics;  Laterality: Left;  . Amputation Left 12/11/2012    Procedure: Amputation of 2nd Toe;  Surgeon: Albert Rossetti, MD;  Location: [facility];  Service: Orthopedics;  Laterality: Left;  . Bypass graft popliteal to tibial Left 12/13/2012    Procedure: BYPASS GRAFT POPLITEAL TO TIBIAL;  Surgeon: Serafina Mitchell, MD;  Location: [facility];  Service: Vascular;  Laterality: Left;  Left Popliteal to Posterior Tibial Bypass Graft with reversed saphenous vein graft  . Incision and drainage Right 06/25/2013    Procedure: INCISION AND DRAINAGE RIGHT FOOT;  Surgeon: Albert Rossetti, MD;  Location: [facility];  Service: Orthopedics;  Laterality: Right;  . Amputation Right 06/29/2013    Procedure: AMPUTATION BELOW KNEE;  Surgeon: Newt Minion, MD;  Location: [facility];  Service: Orthopedics;  Laterality: Right;  . Tee without cardioversion [name] 07/03/2013    Procedure: TRANSESOPHAGEAL ECHOCARDIOGRAM (TEE);  Surgeon: Larey Dresser, MD;  Location: [facility];  Service: Cardiovascular;  Laterality: [name];  . Stump revision Right 08/16/2013    Procedure: STUMP REVISION- right Below Knee Amputation;  Surgeon: Newt Minion, MD;  Location: [facility];  Service: Orthopedics;  Laterality: Right;    History  Smoking status  . Never Smoker   Smokeless tobacco  . Never Used    History  Alcohol Use No    Family History  Problem Relation Age of Onset  . Diabetes    . Pancreatic cancer Mother   . Diabetes Mother   . Hyperlipidemia Mother   . Breast cancer Sister   . Depression Maternal Grandmother   . Heart disease Maternal Grandmother   . Depression Maternal Grandfather   . Heart disease Maternal Grandfather   . Depression Paternal Grandmother   . Heart disease Paternal Grandmother   . Depression Paternal Grandfather   . Heart disease Paternal Grandfather     Review of Systems: The review of systems is per the HPI.  All other systems were reviewed and are negative.  Physical Exam: BP 148/78  Pulse 64  Ht 5\' 11"  (1.803 m)  Wt 223 lb (101.152 kg)  BMI 31.12 kg/m2 Patient is very pleasant and in no acute distress.  He actually walked using a walker today. Skin is warm and dry. Color still a little sallow. HEENT is unremarkable. Normocephalic/atraumatic. PERRL. Sclera are nonicteric. Neck is supple. No masses. No JVD. Lungs are clear. Cardiac exam shows a regular rate and rhythm. His rate is 96 by me. Soft S3 noted. Abdomen is soft. Extremities are without edema. Right BKA with prosthesis in place. Gait and ROM are intact. No gross neurologic deficits noted.  LABORATORY DATA: PENDING  Lab Results  Component Value Date   WBC 7.4 10/18/2013   HGB 9.4* 10/18/2013   HCT 29.9* 10/18/2013   PLT 312.0 10/18/2013   GLUCOSE 170* 10/18/2013   CHOL 91 12/08/2012   TRIG 73 12/08/2012   HDL 22* 12/08/2012   LDLCALC 54 12/08/2012   ALT 10 10/18/2013   AST 11 10/18/2013   NA 138 10/18/2013   K 3.1* 10/18/2013   CL 104 10/18/2013   CREATININE 0.9 10/18/2013   BUN 18 10/18/2013   CO2 26 10/18/2013   TSH 5.58* 10/01/2013   INR 1.20 08/16/2013   HGBA1C 8.2* 06/25/2013    Procedure Performed:  1. Left Heart Catheterization 2. Selective Coronary Angiography 3. Left ventricular angiogram Operator: Lauree Chandler, MD  Indication: 53 yo WM with uncontrolled DM and HTN, severe PAD admitted with septic shock secondary to necrotic right foot. No chest pain but troponin elevated in setting of profound hypotension. Plans for BKA on right but cardiac cath to exclude CAD before BKA.  Procedure Details:  The risks, benefits, complications, treatment options, and expected outcomes were discussed with the patient. The patient and/or family concurred with the proposed plan, giving informed consent. The patient was brought to the cath lab after IV hydration was begun and oral premedication was given. The patient was further sedated with Versed and Fentanyl. The left groin was prepped and draped in the usual manner. Using the modified Seldinger access technique, a 5 French sheath was placed in the left femoral artery. Standard diagnostic catheters were  used to perform selective coronary angiography. A pigtail catheter was used to perform a left ventricular angiogram.  There  were no immediate complications. The patient was taken to the recovery area in stable condition.  Hemodynamic Findings:  Central aortic pressure: 90/61  Left ventricular pressure: 90/19/29    Angiographic Findings:  Left main: Short segment without obstruction.  Left Anterior Descending Artery: Moderate caliber diffusely diseased vessel that courses to the apex. The proximal vessel has diffuse 40% stenosis. The mid vessel has a 70% stenosis followed by a 60% stenosis. The distal vessel has diffuse 90% stenosis. The diagonal is a moderate caliber vessel with ostial 99% stenosis with 99% disease extending into the mid vessel.  Circumflex Artery: Large caliber vessel with proximal 80-90% stenosis. There are three small to moderate caliber obtuse marginal branches. The first OM has mid 80% stenosis. The second OM has a proximal 99% stenosis. The third OM has diffuse 99% stenosis. The left sided posterolateral branch has 99% stenosis.  Right Coronary Artery: Small non-dominant vessel with mid 99% stenosis.  Left Ventricular Angiogram: LVEF=40%  Impression:  1. Triple vessel CAD  2. NSTEMI secondary to demand ischemia from sepsis with severe underlying CAD  3. Mild LV systolic dysfunction   Recommendations: Complex situation. The only option for coronary revascularization is CABG. He is not a favorable candidate for CABG at this time with necrotic right foot, sepsis. Even though not ideal, would favor medical management of CAD while undergoing treatment of his necrotic foot. Would proceed with right BKA this weekend. I have discussed the case with CT surgery who agrees that CABG is not a good option at this time. Will ask CT surgery to see after he recovers from his BKA. Continue ASA, beta blocker, statin.    Assessment / Plan:   1. NSTEMI with severe 3VD with MR as well - No  current chest pain. Managed medically. Now starting to be more mobile. Will increase his Coreg today. See him back in a month. I think we need to consider seeing Albert [name] again although I suspect medical management will be continued. He is not going to be able to return to work. I have given him a letter to that effect today for his short term disability.   2. Ischemic CM - with EF 40% - seems fairly compensated. Coreg increased today.  3. Cough - resolved  4. HTN - Coreg increased today  5. Anemia - hgb 9 on last check - rechecking today  6. Vomiting - resolved.  7. PVD - with prior amputation - now with prosthesis in place and ambulating  8. IDDM   See him back in a month. Consider seeing Albert [name] back on return visit. Coreg increased today. His overall prognosis does remain quite tenuous given his cardiac situation.   Patient is agreeable to this plan and will call if any problems develop in the interim.   Burtis Junes, RN, [facility] [address] [place], [place]  [id number] [phone]

## 2014-01-03 NOTE — Progress Notes (Signed)
Notes Recorded by Burtis Junes, NP on 01/03/2014 at [facility] to report. Labs are satisfactory but needs to start KCL 10 meq BID Recheck BMET on return visit.

## 2014-01-03 NOTE — Patient Instructions (Signed)
We need to check labs today  Increase the Coreg to 1 1/2 pills two times a day - I did send to the drug store  I will see you in a month.  Call the [place] office at [phone] if you have any questions, problems or concerns.

## 2014-01-06 ENCOUNTER — Encounter (HOSPITAL_COMMUNITY): Payer: Self-pay | Admitting: Emergency Medicine

## 2014-01-06 ENCOUNTER — Inpatient Hospital Stay (HOSPITAL_COMMUNITY)
Admission: EM | Admit: 2014-01-06 | Discharge: 2014-01-09 | DRG: 814 | Disposition: A | Discharge status: Home Health | Payer: 59 | Attending: Internal Medicine | Admitting: Internal Medicine

## 2014-01-06 ENCOUNTER — Emergency Department (HOSPITAL_COMMUNITY): Payer: 59

## 2014-01-06 DIAGNOSIS — Z794 Long term (current) use of insulin: Secondary | ICD-10-CM

## 2014-01-06 DIAGNOSIS — I5021 Acute systolic (congestive) heart failure: Secondary | ICD-10-CM

## 2014-01-06 DIAGNOSIS — Z800 Family history of malignant neoplasm of digestive organs: Secondary | ICD-10-CM

## 2014-01-06 DIAGNOSIS — E1165 Type 2 diabetes mellitus with hyperglycemia: Secondary | ICD-10-CM

## 2014-01-06 DIAGNOSIS — Z803 Family history of malignant neoplasm of breast: Secondary | ICD-10-CM

## 2014-01-06 DIAGNOSIS — I69959 Hemiplegia and hemiparesis following unspecified cerebrovascular disease affecting unspecified side: Secondary | ICD-10-CM

## 2014-01-06 DIAGNOSIS — J810 Acute pulmonary edema: Secondary | ICD-10-CM

## 2014-01-06 DIAGNOSIS — D7389 Other diseases of spleen: Principal | ICD-10-CM | POA: Diagnosis present

## 2014-01-06 DIAGNOSIS — I2589 Other forms of chronic ischemic heart disease: Secondary | ICD-10-CM | POA: Diagnosis present

## 2014-01-06 DIAGNOSIS — D735 Infarction of spleen: Secondary | ICD-10-CM

## 2014-01-06 DIAGNOSIS — IMO0002 Reserved for concepts with insufficient information to code with codable children: Secondary | ICD-10-CM

## 2014-01-06 DIAGNOSIS — R319 Hematuria, unspecified: Secondary | ICD-10-CM | POA: Diagnosis present

## 2014-01-06 DIAGNOSIS — S88119A Complete traumatic amputation at level between knee and ankle, unspecified lower leg, initial encounter: Secondary | ICD-10-CM

## 2014-01-06 DIAGNOSIS — IMO0001 Reserved for inherently not codable concepts without codable children: Secondary | ICD-10-CM | POA: Diagnosis present

## 2014-01-06 DIAGNOSIS — I5043 Acute on chronic combined systolic (congestive) and diastolic (congestive) heart failure: Secondary | ICD-10-CM | POA: Diagnosis present

## 2014-01-06 DIAGNOSIS — I255 Ischemic cardiomyopathy: Secondary | ICD-10-CM | POA: Diagnosis present

## 2014-01-06 DIAGNOSIS — D509 Iron deficiency anemia, unspecified: Secondary | ICD-10-CM | POA: Diagnosis present

## 2014-01-06 DIAGNOSIS — Z8249 Family history of ischemic heart disease and other diseases of the circulatory system: Secondary | ICD-10-CM

## 2014-01-06 DIAGNOSIS — Z66 Do not resuscitate: Secondary | ICD-10-CM | POA: Diagnosis present

## 2014-01-06 DIAGNOSIS — I2510 Atherosclerotic heart disease of native coronary artery without angina pectoris: Secondary | ICD-10-CM | POA: Diagnosis present

## 2014-01-06 DIAGNOSIS — D649 Anemia, unspecified: Secondary | ICD-10-CM

## 2014-01-06 DIAGNOSIS — I509 Heart failure, unspecified: Secondary | ICD-10-CM | POA: Diagnosis present

## 2014-01-06 DIAGNOSIS — I739 Peripheral vascular disease, unspecified: Secondary | ICD-10-CM | POA: Diagnosis present

## 2014-01-06 DIAGNOSIS — Z833 Family history of diabetes mellitus: Secondary | ICD-10-CM

## 2014-01-06 DIAGNOSIS — I10 Essential (primary) hypertension: Secondary | ICD-10-CM | POA: Diagnosis present

## 2014-01-06 DIAGNOSIS — E7800 Pure hypercholesterolemia, unspecified: Secondary | ICD-10-CM | POA: Diagnosis present

## 2014-01-06 LAB — URINALYSIS, ROUTINE W REFLEX MICROSCOPIC
Bilirubin Urine: NEGATIVE
Glucose, UA: NEGATIVE mg/dL
Ketones, ur: NEGATIVE mg/dL
Leukocytes, UA: NEGATIVE
Nitrite: NEGATIVE
Protein, ur: 100 mg/dL — AB
Specific Gravity, Urine: >1.03 — ABNORMAL HIGH (ref 1.005–1.030)
Urobilinogen, UA: 0.2 mg/dL (ref 0.0–1.0)
pH: 5.5 (ref 5.0–8.0)

## 2014-01-06 LAB — CBC WITH DIFFERENTIAL/PLATELET
Basophils Absolute: 0 10*3/uL (ref 0.0–0.1)
Basophils Relative: 0 % (ref 0–1)
Eosinophils Absolute: 0.1 10*3/uL (ref 0.0–0.7)
Eosinophils Relative: 1 % (ref 0–5)
HCT: 34.3 % — ABNORMAL LOW (ref 39.0–52.0)
Hemoglobin: 10.6 g/dL — ABNORMAL LOW (ref 13.0–17.0)
Lymphocytes Relative: 7 % — ABNORMAL LOW (ref 12–46)
Lymphs Abs: 0.8 10*3/uL (ref 0.7–4.0)
MCH: 21.7 pg — ABNORMAL LOW (ref 26.0–34.0)
MCHC: 30.9 g/dL (ref 30.0–36.0)
MCV: 70.1 fL — ABNORMAL LOW (ref 78.0–100.0)
Monocytes Absolute: 1.2 10*3/uL — ABNORMAL HIGH (ref 0.1–1.0)
Monocytes Relative: 11 % (ref 3–12)
Neutro Abs: 8.9 10*3/uL — ABNORMAL HIGH (ref 1.7–7.7)
Neutrophils Relative %: 81 % — ABNORMAL HIGH (ref 43–77)
Platelets: 196 10*3/uL (ref 150–400)
RBC: 4.89 MIL/uL (ref 4.22–5.81)
RDW: 21 % — ABNORMAL HIGH (ref 11.5–15.5)
WBC: 11 10*3/uL — ABNORMAL HIGH (ref 4.0–10.5)

## 2014-01-06 LAB — COMPREHENSIVE METABOLIC PANEL
ALT: 9 U/L (ref 0–53)
AST: 11 U/L (ref 0–37)
Albumin: 3.3 g/dL — ABNORMAL LOW (ref 3.5–5.2)
Alkaline Phosphatase: 69 U/L (ref 39–117)
BUN: 20 mg/dL (ref 6–23)
CO2: 29 mEq/L (ref 19–32)
Calcium: 9 mg/dL (ref 8.4–10.5)
Chloride: 96 mEq/L (ref 96–112)
Creatinine, Ser: 1 mg/dL (ref 0.50–1.35)
GFR calc Af Amer: >90 mL/min (ref >90)
GFR calc non Af Amer: 85 mL/min — ABNORMAL LOW (ref >90)
Glucose, Bld: 198 mg/dL — ABNORMAL HIGH (ref 70–99)
Potassium: 4.1 mEq/L (ref 3.7–5.3)
Sodium: 136 mEq/L — ABNORMAL LOW (ref 137–147)
Total Bilirubin: 0.9 mg/dL (ref 0.3–1.2)
Total Protein: 7.1 g/dL (ref 6.0–8.3)

## 2014-01-06 LAB — URINE MICROSCOPIC-ADD ON

## 2014-01-06 LAB — GLUCOSE, CAPILLARY: Glucose-Capillary: 142 mg/dL — ABNORMAL HIGH (ref 70–99)

## 2014-01-06 LAB — LIPASE, BLOOD: Lipase: 14 U/L (ref 11–59)

## 2014-01-06 MED ORDER — LOSARTAN POTASSIUM 50 MG PO TABS
25.0000 mg | ORAL_TABLET | Freq: Every day | ORAL | Status: DC
  Administered 2014-01-06 – 2014-01-09 (×4): 25 mg via ORAL
  Filled 2014-01-06 (×4): qty 1

## 2014-01-06 MED ORDER — INSULIN DETEMIR 100 UNIT/ML ~~LOC~~ SOLN
SUBCUTANEOUS | Status: AC
  Filled 2014-01-06: qty 1

## 2014-01-06 MED ORDER — INSULIN DETEMIR 100 UNIT/ML ~~LOC~~ SOLN
23.0000 [IU] | Freq: Every day | SUBCUTANEOUS | Status: DC
  Administered 2014-01-06 – 2014-01-08 (×3): 23 [IU] via SUBCUTANEOUS
  Filled 2014-01-06 (×4): qty 0.23

## 2014-01-06 MED ORDER — MORPHINE SULFATE 4 MG/ML IJ SOLN
8.0000 mg | Freq: Once | INTRAMUSCULAR | Status: AC
  Administered 2014-01-06: 8 mg via INTRAVENOUS
  Filled 2014-01-06: qty 2

## 2014-01-06 MED ORDER — SODIUM CHLORIDE 0.9 % IV SOLN: 250.0000 mL | INTRAVENOUS | Status: DC | PRN

## 2014-01-06 MED ORDER — SODIUM CHLORIDE 0.9 % IV SOLN
INTRAVENOUS | Status: DC
  Administered 2014-01-06: 14:00:00 via INTRAVENOUS

## 2014-01-06 MED ORDER — ALPRAZOLAM 0.25 MG PO TABS
0.2500 mg | ORAL_TABLET | Freq: Two times a day (BID) | ORAL | Status: DC | PRN
  Administered 2014-01-07: 0.25 mg via ORAL
  Filled 2014-01-06: qty 1

## 2014-01-06 MED ORDER — SODIUM CHLORIDE 0.9 % IJ SOLN
3.0000 mL | Freq: Two times a day (BID) | INTRAMUSCULAR | Status: DC
  Administered 2014-01-06 – 2014-01-09 (×4): 3 mL via INTRAVENOUS

## 2014-01-06 MED ORDER — POTASSIUM CHLORIDE CRYS ER 10 MEQ PO TBCR
10.0000 meq | EXTENDED_RELEASE_TABLET | Freq: Two times a day (BID) | ORAL | Status: DC
  Administered 2014-01-06 – 2014-01-09 (×6): 10 meq via ORAL
  Filled 2014-01-06 (×10): qty 1

## 2014-01-06 MED ORDER — CARVEDILOL 12.5 MG PO TABS
37.5000 mg | ORAL_TABLET | Freq: Two times a day (BID) | ORAL | Status: DC
  Administered 2014-01-06 – 2014-01-09 (×6): 37.5 mg via ORAL
  Filled 2014-01-06 (×6): qty 3

## 2014-01-06 MED ORDER — MORPHINE SULFATE 2 MG/ML IJ SOLN
2.0000 mg | INTRAMUSCULAR | Status: DC | PRN
  Administered 2014-01-07 – 2014-01-09 (×3): 2 mg via INTRAVENOUS
  Filled 2014-01-06 (×3): qty 1

## 2014-01-06 MED ORDER — FUROSEMIDE 80 MG PO TABS
80.0000 mg | ORAL_TABLET | Freq: Two times a day (BID) | ORAL | Status: DC
  Administered 2014-01-06 – 2014-01-07 (×3): 80 mg via ORAL
  Filled 2014-01-06 (×3): qty 1

## 2014-01-06 MED ORDER — ONDANSETRON HCL 4 MG/2ML IJ SOLN
4.0000 mg | Freq: Four times a day (QID) | INTRAMUSCULAR | Status: DC | PRN
  Administered 2014-01-07: 4 mg via INTRAVENOUS
  Filled 2014-01-06: qty 2

## 2014-01-06 MED ORDER — HEPARIN BOLUS VIA INFUSION
4000.0000 [IU] | Freq: Once | INTRAVENOUS | Status: AC
  Administered 2014-01-06: 4000 [IU] via INTRAVENOUS
  Filled 2014-01-06: qty 4000

## 2014-01-06 MED ORDER — SODIUM CHLORIDE 0.9 % IJ SOLN: 3.0000 mL | INTRAMUSCULAR | Status: DC | PRN

## 2014-01-06 MED ORDER — ONDANSETRON HCL 4 MG/2ML IJ SOLN
4.0000 mg | Freq: Once | INTRAMUSCULAR | Status: DC
Start: 2014-01-06 — End: 2014-01-06
  Filled 2014-01-06: qty 2

## 2014-01-06 MED ORDER — ONDANSETRON HCL 4 MG/2ML IJ SOLN
4.0000 mg | Freq: Once | INTRAMUSCULAR | Status: AC
  Administered 2014-01-06: 4 mg via INTRAVENOUS

## 2014-01-06 MED ORDER — TAMSULOSIN HCL 0.4 MG PO CAPS
0.4000 mg | ORAL_CAPSULE | Freq: Every day | ORAL | Status: DC
  Administered 2014-01-06 – 2014-01-08 (×3): 0.4 mg via ORAL
  Filled 2014-01-06 (×3): qty 1

## 2014-01-06 MED ORDER — IOHEXOL 300 MG/ML  SOLN
100.0000 mL | Freq: Once | INTRAMUSCULAR | Status: AC | PRN
  Administered 2014-01-06: 100 mL via INTRAVENOUS

## 2014-01-06 MED ORDER — ONDANSETRON HCL 4 MG PO TABS: 4.0000 mg | ORAL_TABLET | Freq: Four times a day (QID) | ORAL | Status: DC | PRN

## 2014-01-06 MED ORDER — SODIUM CHLORIDE 0.9 % IJ SOLN
3.0000 mL | Freq: Two times a day (BID) | INTRAMUSCULAR | Status: DC
  Administered 2014-01-06 – 2014-01-08 (×5): 3 mL via INTRAVENOUS

## 2014-01-06 MED ORDER — SIMVASTATIN 20 MG PO TABS
40.0000 mg | ORAL_TABLET | Freq: Every evening | ORAL | Status: DC
  Administered 2014-01-06 – 2014-01-08 (×3): 40 mg via ORAL
  Filled 2014-01-06 (×3): qty 2

## 2014-01-06 MED ORDER — HEPARIN (PORCINE) IN NACL 100-0.45 UNIT/ML-% IJ SOLN
2100.0000 [IU]/h | INTRAMUSCULAR | Status: DC
  Administered 2014-01-06 (×2): 1500 [IU]/h via INTRAVENOUS
  Administered 2014-01-07 (×2): 1800 [IU]/h via INTRAVENOUS
  Filled 2014-01-06 (×2): qty 250

## 2014-01-06 MED ORDER — INSULIN ASPART 100 UNIT/ML ~~LOC~~ SOLN
0.0000 [IU] | Freq: Three times a day (TID) | SUBCUTANEOUS | Status: DC
  Administered 2014-01-07 (×3): 2 [IU] via SUBCUTANEOUS
  Administered 2014-01-09: 1 [IU] via SUBCUTANEOUS

## 2014-01-06 MED ORDER — HYDRALAZINE HCL 10 MG PO TABS
10.0000 mg | ORAL_TABLET | Freq: Three times a day (TID) | ORAL | Status: DC
  Administered 2014-01-06 – 2014-01-09 (×9): 10 mg via ORAL
  Filled 2014-01-06 (×9): qty 1

## 2014-01-06 MED ORDER — INSULIN ASPART 100 UNIT/ML ~~LOC~~ SOLN
5.0000 [IU] | Freq: Three times a day (TID) | SUBCUTANEOUS | Status: DC
  Administered 2014-01-07 – 2014-01-09 (×6): 5 [IU] via SUBCUTANEOUS

## 2014-01-06 NOTE — ED Provider Notes (Signed)
CSN: [id number]     Arrival date & time 01/06/14  1211 History  This chart was scribed for Albert Manifold, MD by Ludger Nutting, ED Scribe. This patient was seen in room APA09/APA09 and the patient's care was started 1:38 PM.    Chief Complaint  Patient presents with  . Abdominal Pain      The history is provided by the patient. No language interpreter was used.    HPI Comments: Albert [name] is a 53 y.o. male who presents to the Emergency Department complaining of 3 days of constant left sided abdominal pain and left rib pain. He states the abdominal pain began first and then the left rib pain began the following day. He has associated nausea, dry-heaving, and occasional dysuria. He states deep breathing, palpation, and coughing worsens the pain. He has not taken any pain medications for his symptoms. He denies similar symptoms in the past. He denies cough, SOB, hematuria. He denies history of kidney stones or abdominal surgeries.     Past Medical History  Diagnosis Date  . Hypertension   . Diabetes mellitus without complication [id number]    last HbA1c ~9  . Hypercholesteremia   . CVA (cerebral infarction) 2011    R hand deficit  . Chronic sinusitis   . Allergic rhinitis   . Chronic cough   . Complication of anesthesia     couldn't swallow and talk  . Stroke    Past Surgical History  Procedure Laterality Date  . 4th toe amputation Left Jan. 2014    4th toe in [place]  . Tonsillectomy and adenoidectomy    . Penile prosthesis implant    . I&d extremity Left 12/08/2012    Procedure: IRRIGATION AND DEBRIDEMENT EXTREMITY;  Surgeon: Mcarthur Rossetti, MD;  Location: [place];  Service: Orthopedics;  Laterality: Left;  . Amputation Left 12/08/2012    Procedure: AMPUTATION DIGIT;  Surgeon: Mcarthur Rossetti, MD;  Location: [place];  Service: Orthopedics;  Laterality: Left;  3rd toe amputation possible 5th toe amputation  . I&d extremity Left 12/11/2012    Procedure: REPEAT I&D LEFT FOOT;   Surgeon: Mcarthur Rossetti, MD;  Location: [place];  Service: Orthopedics;  Laterality: Left;  . Amputation Left 12/11/2012    Procedure: Amputation of 2nd Toe;  Surgeon: Mcarthur Rossetti, MD;  Location: [place];  Service: Orthopedics;  Laterality: Left;  . Bypass graft popliteal to tibial Left 12/13/2012    Procedure: BYPASS GRAFT POPLITEAL TO TIBIAL;  Surgeon: Serafina Mitchell, MD;  Location: [facility];  Service: Vascular;  Laterality: Left;  Left Popliteal to Posterior Tibial Bypass Graft with reversed saphenous vein graft  . Incision and drainage Right 06/25/2013    Procedure: INCISION AND DRAINAGE RIGHT FOOT;  Surgeon: Mcarthur Rossetti, MD;  Location: [facility];  Service: Orthopedics;  Laterality: Right;  . Amputation Right 06/29/2013    Procedure: AMPUTATION BELOW KNEE;  Surgeon: Newt Minion, MD;  Location: [place];  Service: Orthopedics;  Laterality: Right;  . Tee without cardioversion [name] 07/03/2013    Procedure: TRANSESOPHAGEAL ECHOCARDIOGRAM (TEE);  Surgeon: Larey Dresser, MD;  Location: [place];  Service: Cardiovascular;  Laterality: [name];  . Stump revision Right 08/16/2013    Procedure: STUMP REVISION- right Below Knee Amputation;  Surgeon: Newt Minion, MD;  Location: [place];  Service: Orthopedics;  Laterality: Right;   Family History  Problem Relation Age of Onset  . Diabetes    . Pancreatic cancer Mother   .  Diabetes Mother   . Hyperlipidemia Mother   . Breast cancer Sister   . Depression Maternal Grandmother   . Heart disease Maternal Grandmother   . Depression Maternal Grandfather   . Heart disease Maternal Grandfather   . Depression Paternal Grandmother   . Heart disease Paternal Grandmother   . Depression Paternal Grandfather   . Heart disease Paternal Grandfather    History  Substance Use Topics  . Smoking status: Never Smoker   . Smokeless tobacco: Never Used  . Alcohol Use: No    Review of Systems  Respiratory: Negative for cough and shortness of  breath.   Gastrointestinal: Positive for nausea, vomiting and abdominal pain. Negative for diarrhea.  Genitourinary: Positive for dysuria. Negative for hematuria.  Musculoskeletal: Positive for arthralgias (left rib pain).  All other systems reviewed and are negative.      Allergies  Amoxicillin  Home Medications   Current Outpatient Rx  Name  Route  Sig  Dispense  Refill  . ALPRAZolam (XANAX) 0.25 MG tablet   Oral   Take 1 tablet (0.25 mg total) by mouth 2 (two) times daily as needed for anxiety.   30 tablet   0   . carvedilol (COREG) 25 MG tablet   Oral   Take 1.5 tablets (37.5 mg total) by mouth 2 (two) times daily with a meal.   90 tablet   2   . ferrous sulfate 325 (65 FE) MG EC tablet   Oral   Take 325 mg by mouth daily with breakfast.         . furosemide (LASIX) 80 MG tablet   Oral   Take 1 tablet (80 mg total) by mouth 2 (two) times daily.   60 tablet   1   . hydrALAZINE (APRESOLINE) 10 MG tablet   Oral   Take 1 tablet (10 mg total) by mouth 3 (three) times daily.   120 tablet   1   . insulin aspart (NOVOLOG) 100 UNIT/ML injection   Subcutaneous   Inject 5 Units into the skin 3 (three) times daily before meals.         . insulin detemir (LEVEMIR) 100 UNIT/ML injection   Subcutaneous   Inject 0.23 mLs (23 Units total) into the skin at bedtime.   10 mL   12   . Loratadine-Pseudoephedrine (CLARITIN-D 12 HOUR PO)   Oral   Take 1 tablet by mouth as needed (allergies).          . losartan (COZAAR) 25 MG tablet   Oral   Take 1 tablet (25 mg total) by mouth daily.   30 tablet   3   . pantoprazole (PROTONIX) 40 MG tablet   Oral   Take 1 tablet (40 mg total) by mouth daily.   30 tablet   1   . polyethylene glycol (MIRALAX / GLYCOLAX) packet   Oral   Take 17 g by mouth daily as needed for mild constipation.          . potassium chloride (K-DUR) 10 MEQ tablet   Oral   Take 1 tablet (10 mEq total) by mouth 2 (two) times daily.   60  tablet   3   . promethazine (PHENERGAN) 25 MG tablet      Take 1 tab every 6-8 hours for nausea.   40 tablet   1   . senna-docusate (SENOKOT-S) 8.6-50 MG per tablet   Oral   Take 1 tablet by mouth as needed.         Marland Kitchen  simvastatin (ZOCOR) 40 MG tablet   Oral   Take 1 tablet (40 mg total) by mouth every evening.   30 tablet   1   . tamsulosin (FLOMAX) 0.4 MG CAPS capsule   Oral   Take 1 capsule (0.4 mg total) by mouth daily after supper.   30 capsule   1    BP 133/97  Pulse 105  Temp(Src) 98.2 F (36.8 C) (Oral)  Resp 14  SpO2 99% Physical Exam  Nursing note and vitals reviewed. Constitutional: He is oriented to person, place, and time. He appears well-developed and well-nourished.  HENT:  Head: Normocephalic and atraumatic.  Eyes: EOM are normal.  Neck: Normal range of motion.  Cardiovascular: Normal rate, regular rhythm, normal heart sounds and intact distal pulses.   Pulmonary/Chest: Effort normal and breath sounds normal. No respiratory distress.  Abdominal: Soft. He exhibits no distension. There is tenderness (diffuse, worse on the left). There is guarding (voluntary ) and CVA tenderness (left). There is no rebound.  Musculoskeletal: Normal range of motion. He exhibits edema (pitting LLE ).  BKA on right   Neurological: He is alert and oriented to person, place, and time.  Skin: Skin is warm and dry.  Psychiatric: He has a normal mood and affect. Judgment normal.    ED Course  Procedures   DIAGNOSTIC STUDIES: Oxygen Saturation is 99% on RA, normal by my interpretation.    COORDINATION OF CARE: 1:49 PM Discussed treatment plan with pt at bedside and pt agreed to plan.  Labs Review Labs Reviewed  CBC WITH DIFFERENTIAL - Abnormal; Notable for the following:    WBC 11.0 (*)    Hemoglobin 10.6 (*)    HCT 34.3 (*)    MCV 70.1 (*)    MCH 21.7 (*)    RDW 21.0 (*)    All other components within normal limits  COMPREHENSIVE METABOLIC PANEL  LIPASE,  BLOOD  URINALYSIS, ROUTINE W REFLEX MICROSCOPIC   Imaging Review No results found.  Ct Abdomen Pelvis Wo Contrast  01/06/2014   CLINICAL DATA:  Left flank pain.  EXAM: CT ABDOMEN AND PELVIS WITHOUT CONTRAST  TECHNIQUE: Multidetector CT imaging of the abdomen and pelvis was performed following the standard protocol without intravenous contrast.  COMPARISON:  None.  FINDINGS: There are moderate to large bilateral pleural effusions. Compressive atelectasis in the lower lobes bilaterally. Small pericardial effusion. Heart is mildly enlarged.  Area of low density noted anteriorly and and inferiorly within the spleen. There is adjacent surrounding stranding near the tip of the spleen and extending into the left anterior para renal space. Small amount of perihepatic ascites adjacent to the liver tip. This is nonspecific. Contrast CT would be beneficial for further evaluation. Differential considerations would include infection, infarcts or trauma.  Liver, gallbladder, pancreas, adrenals and kidneys have an unremarkable unenhanced appearance. No renal or ureteral stones. No hydronephrosis.  Aorta and iliac vessels are calcified, non aneurysmal. Penile prosthesis noted. Trace free fluid in the cul-de-sac of the pelvis.  Stomach, large and small bowel grossly unremarkable.  No acute bony abnormality.  IMPRESSION: Large bilateral pleural effusions. Compressive atelectasis in the lower lobes. Small pericardial effusion.  Multiple areas of abnormal low-density within the spleen. Adjacent stranding. Differential considerations would include splenic infarcts, infection, or trauma. This area would be better evaluated and visualized with contrast CT.  Small amount of free fluid adjacent to the liver and in the cul-de-sac of the pelvis.  These results were called by telephone at the  time of interpretation on 01/06/2014 at 3:02 PM to Dr. Virgel [name] , who verbally acknowledged these results.   Electronically Signed   By: Rolm Baptise M.D.   On: 01/06/2014 15:11   Ct Angio Abdomen W/cm &/or Wo Contrast  01/08/2014   CLINICAL DATA:  Left upper abdominal pain, acute splenic infarctions  EXAM: CT ANGIOGRAPHY ABDOMEN  TECHNIQUE: Multidetector CT imaging of the abdomen and pelvis using the standard protocol during bolus administration of intravenous contrast. Multiplanar reconstructed images obtained and reviewed to evaluate the vascular anatomy.  CONTRAST:  120mL OMNIPAQUE IOHEXOL 350 MG/ML SOLN  COMPARISON:  01/06/2014  FINDINGS: Large dependent pleural effusions noted with bibasilar and lingula compressive atelectasis. The pleural effusions are slightly larger compared to the prior study. Small dependent pericardial effusion noted. No hiatal hernia.  Abdomen: Abdominal aorta demonstrates calcific atherosclerosis of the infrarenal segment and bifurcation without aneurysm, dissection or occlusion. No retroperitoneal hemorrhage. Celiac is widely patent. Celiac branches including the hepatic, left gastric, splenic arteries are all patent. SMA origin is widely patent. Main SMA trunk is patent in the central mesentery. Main renal arteries are well visualized and patent bilaterally. IMA is patent off of the inferior aorta.  Negative for significant mesenteric or renal vascular occlusive process. No identifiable embolic source to account for the evolving subacute splenic infarcts.  Nonvascular findings: Small amount of abdominal free fluid/ascites along the pericolic gutters bilaterally.  Liver, collapsed gallbladder, biliary system, pancreas, adrenal glands, and kidneys are within normal limits for age and demonstrate no acute process.  Similar wedge-shaped hypodense areas in the spleen compatible with evolving splenic infarcts.  No subcapsular acute hemorrhage or hematoma.  Negative for bowel obstruction, dilatation, ileus, or free air.  No abdominal fluid collection, abscess, or large hematoma.  Minor degenerative changes of the spine  diffusely.  IMPRESSION: Patent mesenteric vasculature without an identifiable embolic source to account for the subacute splenic infarcts.  Large pleural effusions with associated compressive bibasilar and lingular atelectasis. Trace pericardial effusion.  Small amount of intra-abdominal free fluid/ascites along the pericolic gutters as before, unchanged   Electronically Signed   By: Daryll Brod M.D.   On: 01/08/2014 16:20   Ct Abdomen Pelvis W Contrast  01/06/2014   CLINICAL DATA:  Left upper quadrant pain. Abnormal spleen on noncontrast study.  EXAM: CT ABDOMEN AND PELVIS WITH CONTRAST  TECHNIQUE: Multidetector CT imaging of the abdomen and pelvis was performed using the standard protocol following bolus administration of intravenous contrast.  CONTRAST:  117mL OMNIPAQUE IOHEXOL 300 MG/ML  SOLN  COMPARISON:  Noncontrast study earlier today.  FINDINGS: Large bilateral pleural effusions noted. There is compressive atelectasis in the lower lobes. Small pericardial effusion.  Abnormal areas of decreased enhancement noted within the spleen, both the medial superior pole, anterior pole and inferior pole. I suspect these represent areas of splenic infarction. Superior pole area is wedge-shaped on coronal imaging. There is surrounding stranding adjacent to the inferior pole of the spleen.  Small amount of perihepatic ascites and pelvic ascites. Liver, gallbladder, pancreas, adrenals and kidneys are unremarkable.  Stomach, large and small bowel are unremarkable. No free air or adenopathy. Urinary bladder is unremarkable. Penile prosthesis noted.  No acute bony abnormality.  Atherosclerotic calcifications throughout the infrarenal aorta and iliofemoral vessels.  IMPRESSION: Multiple areas of decreased enhancement throughout the spleen. These are most compatible with splenic infarcts. Exact source is not visualized. No other end organ infarcts noted in the abdomen.  Trace free fluid adjacent to the  liver and in the  pelvis.  Large bilateral pleural effusions with compressive atelectasis in the lower lobes. Small pericardial effusion.   Electronically Signed   By: Rolm Baptise M.D.   On: 01/06/2014 16:20   Dg Chest Port 1 View  01/07/2014   CLINICAL DATA:  pleural effusion  EXAM: PORTABLE CHEST - 1 VIEW  COMPARISON:  DG CHEST 2 VIEW dated 08/02/2013  FINDINGS: Low lung volumes. Cardiac silhouette is enlarged. There is blunting of the costophrenic angles. Areas of increased density projects within the lung bases. There is prominence of the interstitial markings and areas of peribronchial cuffing. Osseous structures unremarkable.  IMPRESSION: Interstitial infiltrates likely reflecting pulmonary edema.  Blunted costophrenic angles consistent with small effusions left greater than right  Stable cardiomegaly   Electronically Signed   By: Margaree Mackintosh M.D.   On: 01/07/2014 17:28    EKG Interpretation None      MDM   Final diagnoses:  Splenic infarct    53yM with L sided abdominal pain. Voluntary guarding L abdomen/flank and L CVA tenderness. No specific urinary complaints. No significant change in bowel habits. Consider UTI/pyelo/stone/diverticulitis/etc. Plan labs, UA, CT. Symptomatic tx and reassessment.   I personally preformed the services scribed in my presence. The recorded information has been reviewed is accurate. Albert Manifold, MD.   Albert Manifold, MD 01/10/14 [phone]

## 2014-01-06 NOTE — Progress Notes (Signed)
ANTICOAGULATION CONSULT NOTE - Initial Consult  Pharmacy Consult for Heparin Indication: splenic infarct, ? Due to embolic source  Allergies  Allergen Reactions  . Amoxicillin Nausea And Vomiting    Patient Measurements:   Last recorded weight = 101Kg  Vital Signs: Temp: 98.2 F (36.8 C) (03/23 1215) Temp src: Oral (03/23 1215) BP: 139/87 mmHg (03/23 1457) Pulse Rate: 101 (03/23 1457)  Labs:  Recent Labs  01/06/14 1300  HGB 10.6*  HCT 34.3*  PLT 196  CREATININE 1.00    The CrCl is unknown because both a height and weight (above a minimum accepted value) are required for this calculation.   Medical History: Past Medical History  Diagnosis Date  . Hypertension   . Diabetes mellitus without complication ~7096    last HbA1c ~9  . Hypercholesteremia   . CVA (cerebral infarction) 2011    R hand deficit  . Chronic sinusitis   . Allergic rhinitis   . Chronic cough   . Complication of anesthesia     couldn't swallow and talk  . Stroke     Medications:  Prescriptions prior to admission  Medication Sig Dispense Refill  . ALPRAZolam (XANAX) 0.25 MG tablet Take 1 tablet (0.25 mg total) by mouth 2 (two) times daily as needed for anxiety.  30 tablet  0  . carvedilol (COREG) 25 MG tablet Take 1.5 tablets (37.5 mg total) by mouth 2 (two) times daily with a meal.  90 tablet  2  . ferrous sulfate 325 (65 FE) MG EC tablet Take 325 mg by mouth daily with breakfast.      . furosemide (LASIX) 80 MG tablet Take 1 tablet (80 mg total) by mouth 2 (two) times daily.  60 tablet  1  . hydrALAZINE (APRESOLINE) 10 MG tablet Take 1 tablet (10 mg total) by mouth 3 (three) times daily.  120 tablet  1  . insulin aspart (NOVOLOG) 100 UNIT/ML injection Inject 5 Units into the skin 3 (three) times daily before meals.      . insulin detemir (LEVEMIR) 100 UNIT/ML injection Inject 0.23 mLs (23 Units total) into the skin at bedtime.  10 mL  12  . Loratadine-Pseudoephedrine (CLARITIN-D 12 HOUR PO)  Take 1 tablet by mouth as needed (allergies).       . losartan (COZAAR) 25 MG tablet Take 1 tablet (25 mg total) by mouth daily.  30 tablet  3  . pantoprazole (PROTONIX) 40 MG tablet Take 1 tablet (40 mg total) by mouth daily.  30 tablet  1  . polyethylene glycol (MIRALAX / GLYCOLAX) packet Take 17 g by mouth daily as needed for mild constipation.       . potassium chloride (K-DUR) 10 MEQ tablet Take 1 tablet (10 mEq total) by mouth 2 (two) times daily.  60 tablet  3  . promethazine (PHENERGAN) 25 MG tablet Take 1 tab every 6-8 hours for nausea.  40 tablet  1  . senna-docusate (SENOKOT-S) 8.6-50 MG per tablet Take 1 tablet by mouth as needed.      . simvastatin (ZOCOR) 40 MG tablet Take 1 tablet (40 mg total) by mouth every evening.  30 tablet  1  . tamsulosin (FLOMAX) 0.4 MG CAPS capsule Take 1 capsule (0.4 mg total) by mouth daily after supper.  30 capsule  1    Assessment: 53yo male presents to ED with cc of abdominal pain.  Pt has h/o PVD.  Asked to initiate Heparin IV for splenic infarct due to possible  embolic source.    Goal of Therapy:  Heparin level 0.3-0.7 units/ml Monitor platelets by anticoagulation protocol: Yes   Plan:  Heparin 4000 units bolus IV x 1 now Heparin infusion at 1500 units/hr Heparin level in 6-8 hours then daily CBC daily  Hart Robinsons A 01/06/2014,7:51 PM

## 2014-01-06 NOTE — ED Notes (Signed)
Pt c/o abd pain and nausea since Friday.

## 2014-01-06 NOTE — ED Notes (Signed)
Report given to [place], Therapist, sports. Pt ready for transport.

## 2014-01-06 NOTE — H&P (Signed)
PCP:   Minus Breeding, MD   Chief Complaint:  Abdominal pain  HPI:  53 year old male who  has a past medical history of Hypertension; Diabetes mellitus without complication (~1941); Hypercholesteremia; CVA (cerebral infarction) (2011); Chronic sinusitis; Allergic rhinitis; Chronic cough; Complication of anesthesia; and Stroke.  Patient presents to the ED with chief complaint of abdominal pain which started to his ago, located mainly in the left upper quadrant gets worse on deep breathing and coughing and hiccups. Not really require anything except pain medications. Which she received in the ED. Patient has significant history of peripheral vascular disease, right BKA, stroke with residual hemiparesis of right upper extremity. Patient also has a history of recurrent sepsis/emesis erythremia TEE done in September 2014 was negative for vegetation. Patient also has a history of congestive heart failure and is currently maintained on Lasix. Patient is followed by cardiology as outpatient. He denies recent trauma, no nausea or diarrhea. He has been complaining of some dysuria but denies fever. In the ED patient was found to have hematuria with several 10 RBCs per high-power field in the UA and some hyaline casts. CBC reveals schistocytes, giant platelets on the peripheral smear. He denies fever, no chest pain shortness of breath, no nausea vomiting or diarrhea.  Allergies:    Allergies  Allergen Reactions  . Amoxicillin Nausea And Vomiting      Past Medical History  Diagnosis Date  . Hypertension   . Diabetes mellitus without complication ~7408    last HbA1c ~9  . Hypercholesteremia   . CVA (cerebral infarction) 2011    R hand deficit  . Chronic sinusitis   . Allergic rhinitis   . Chronic cough   . Complication of anesthesia     couldn't swallow and talk  . Stroke     Past Surgical History  Procedure Laterality Date  . 4th toe amputation Left Jan. 2014    4th toe in [place]  .  Tonsillectomy and adenoidectomy    . Penile prosthesis implant    . I&d extremity Left 12/08/2012    Procedure: IRRIGATION AND DEBRIDEMENT EXTREMITY;  Surgeon: Mcarthur Rossetti, MD;  Location: [place];  Service: Orthopedics;  Laterality: Left;  . Amputation Left 12/08/2012    Procedure: AMPUTATION DIGIT;  Surgeon: Mcarthur Rossetti, MD;  Location: [place];  Service: Orthopedics;  Laterality: Left;  3rd toe amputation possible 5th toe amputation  . I&d extremity Left 12/11/2012    Procedure: REPEAT I&D LEFT FOOT;  Surgeon: Mcarthur Rossetti, MD;  Location: [place];  Service: Orthopedics;  Laterality: Left;  . Amputation Left 12/11/2012    Procedure: Amputation of 2nd Toe;  Surgeon: Mcarthur Rossetti, MD;  Location: [place];  Service: Orthopedics;  Laterality: Left;  . Bypass graft popliteal to tibial Left 12/13/2012    Procedure: BYPASS GRAFT POPLITEAL TO TIBIAL;  Surgeon: Serafina Mitchell, MD;  Location: [facility];  Service: Vascular;  Laterality: Left;  Left Popliteal to Posterior Tibial Bypass Graft with reversed saphenous vein graft  . Incision and drainage Right 06/25/2013    Procedure: INCISION AND DRAINAGE RIGHT FOOT;  Surgeon: Mcarthur Rossetti, MD;  Location: [facility];  Service: Orthopedics;  Laterality: Right;  . Amputation Right 06/29/2013    Procedure: AMPUTATION BELOW KNEE;  Surgeon: Newt Minion, MD;  Location: [place];  Service: Orthopedics;  Laterality: Right;  . Tee without cardioversion [name] 07/03/2013    Procedure: TRANSESOPHAGEAL ECHOCARDIOGRAM (TEE);  Surgeon: Larey Dresser, MD;  Location:  [facility];  Service: Cardiovascular;  Laterality: [name];  . Stump revision Right 08/16/2013    Procedure: STUMP REVISION- right Below Knee Amputation;  Surgeon: Newt Minion, MD;  Location: [place];  Service: Orthopedics;  Laterality: Right;    Prior to Admission medications   Medication Sig Start Date End Date Taking? Authorizing Provider  ALPRAZolam (XANAX) 0.25 MG tablet Take 1  tablet (0.25 mg total) by mouth 2 (two) times daily as needed for anxiety. 08/08/13  Yes Daniel J Angiulli, PA-C  carvedilol (COREG) 25 MG tablet Take 1.5 tablets (37.5 mg total) by mouth 2 (two) times daily with a meal. 01/03/14  Yes Burtis Junes, NP  ferrous sulfate 325 (65 FE) MG EC tablet Take 325 mg by mouth daily with breakfast.   Yes Historical Provider, MD  furosemide (LASIX) 80 MG tablet Take 1 tablet (80 mg total) by mouth 2 (two) times daily. 08/08/13  Yes Daniel J Angiulli, PA-C  hydrALAZINE (APRESOLINE) 10 MG tablet Take 1 tablet (10 mg total) by mouth 3 (three) times daily. 10/01/13  Yes Burtis Junes, NP  insulin aspart (NOVOLOG) 100 UNIT/ML injection Inject 5 Units into the skin 3 (three) times daily before meals.   Yes Historical Provider, MD  insulin detemir (LEVEMIR) 100 UNIT/ML injection Inject 0.23 mLs (23 Units total) into the skin at bedtime. 08/08/13  Yes Daniel J Angiulli, PA-C  Loratadine-Pseudoephedrine (CLARITIN-D 12 HOUR PO) Take 1 tablet by mouth as needed (allergies).    Yes Historical Provider, MD  losartan (COZAAR) 25 MG tablet Take 1 tablet (25 mg total) by mouth daily. 10/01/13  Yes Burtis Junes, NP  pantoprazole (PROTONIX) 40 MG tablet Take 1 tablet (40 mg total) by mouth daily. 08/08/13  Yes Daniel J Angiulli, PA-C  polyethylene glycol (MIRALAX / GLYCOLAX) packet Take 17 g by mouth daily as needed for mild constipation.    Yes Historical Provider, MD  potassium chloride (K-DUR) 10 MEQ tablet Take 1 tablet (10 mEq total) by mouth 2 (two) times daily. 01/03/14  Yes Burtis Junes, NP  promethazine (PHENERGAN) 25 MG tablet Take 1 tab every 6-8 hours for nausea. 10/18/13  Yes Amy S Esterwood, PA-C  senna-docusate (SENOKOT-S) 8.6-50 MG per tablet Take 1 tablet by mouth as needed. 08/08/13  Yes Daniel J Angiulli, PA-C  simvastatin (ZOCOR) 40 MG tablet Take 1 tablet (40 mg total) by mouth every evening. 08/08/13  Yes Daniel J Angiulli, PA-C  tamsulosin (FLOMAX) 0.4  MG CAPS capsule Take 1 capsule (0.4 mg total) by mouth daily after supper. 08/08/13  Yes Daniel J Angiulli, PA-C    Social History:  reports that he has never smoked. He has never used smokeless tobacco. He reports that he does not drink alcohol or use illicit drugs.  Family History  Problem Relation Age of Onset  . Diabetes    . Pancreatic cancer Mother   . Diabetes Mother   . Hyperlipidemia Mother   . Breast cancer Sister   . Depression Maternal Grandmother   . Heart disease Maternal Grandmother   . Depression Maternal Grandfather   . Heart disease Maternal Grandfather   . Depression Paternal Grandmother   . Heart disease Paternal Grandmother   . Depression Paternal Grandfather   . Heart disease Paternal Grandfather      All the positives are listed in BOLD  Review of Systems:  HEENT: Headache, blurred vision, runny nose, sore throat Neck: Hypothyroidism, hyperthyroidism,,lymphadenopathy Chest : Shortness of breath, history of COPD, Asthma  Heart : Chest pain, history of coronary arterey disease GI:  Nausea, vomiting, diarrhea, constipation, GERD  GU: Dysuria, urgency, frequency of urination, hematuria Neuro: Stroke, seizures, syncope Psych: Depression, anxiety, hallucinations   Physical Exam: Blood pressure 139/87, pulse 101, temperature 98.2 F (36.8 C), temperature source Oral, resp. rate 18, SpO2 94.00%. Constitutional:   Patient is a well-developed and well-nourished  male in no acute distress and cooperative with exam. Head: Normocephalic and atraumatic Mouth: Mucus membranes moist Eyes: PERRL, EOMI, conjunctivae normal Neck: Supple, No Thyromegaly Cardiovascular: RRR, S1 normal, S2 normal Pulmonary/Chest: CTAB, no wheezes, rales, or rhonchi Abdominal: Soft.  positive tenderness in the left upper quadrant , non-distended, bowel sounds are normal, no masses, organomegaly, or guarding present.  Neurological: A&O x3, Strenght is normal and symmetric bilaterally,  cranial nerve II-XII are grossly intact, no focal motor deficit, sensory intact to light touch bilaterally.  Extremities : No Cyanosis, Clubbing or Edema   Labs on Admission:  Results for orders placed during the hospital encounter of 01/06/14 (from the past 48 hour(s))  CBC WITH DIFFERENTIAL     Status: Abnormal   Collection Time    01/06/14  1:00 PM      Result Value Ref Range   WBC 11.0 (*) 4.0 - 10.5 K/uL   RBC 4.89  4.22 - 5.81 MIL/uL   Hemoglobin 10.6 (*) 13.0 - 17.0 g/dL   HCT 34.3 (*) 39.0 - 52.0 %   MCV 70.1 (*) 78.0 - 100.0 fL   MCH 21.7 (*) 26.0 - 34.0 pg   MCHC 30.9  30.0 - 36.0 g/dL   RDW 21.0 (*) 11.5 - 15.5 %   Platelets 196  150 - 400 K/uL   Neutrophils Relative % 81 (*) 43 - 77 %   Lymphocytes Relative 7 (*) 12 - 46 %   Monocytes Relative 11  3 - 12 %   Eosinophils Relative 1  0 - 5 %   Basophils Relative 0  0 - 1 %   Neutro Abs 8.9 (*) 1.7 - 7.7 K/uL   Lymphs Abs 0.8  0.7 - 4.0 K/uL   Monocytes Absolute 1.2 (*) 0.1 - 1.0 K/uL   Eosinophils Absolute 0.1  0.0 - 0.7 K/uL   Basophils Absolute 0.0  0.0 - 0.1 K/uL   RBC Morphology SCHISTOCYTES PRESENT (2-5/hpf)     WBC Morphology ATYPICAL LYMPHOCYTES     Smear Review PLATELET COUNT CONFIRMED BY SMEAR     Comment: LARGE PLATELETS PRESENT     GIANT PLATELETS SEEN  COMPREHENSIVE METABOLIC PANEL     Status: Abnormal   Collection Time    01/06/14  1:00 PM      Result Value Ref Range   Sodium 136 (*) 137 - 147 mEq/L   Potassium 4.1  3.7 - 5.3 mEq/L   Chloride 96  96 - 112 mEq/L   CO2 29  19 - 32 mEq/L   Glucose, Bld 198 (*) 70 - 99 mg/dL   BUN 20  6 - 23 mg/dL   Creatinine, Ser 1.00  0.50 - 1.35 mg/dL   Calcium 9.0  8.4 - 10.5 mg/dL   Total Protein 7.1  6.0 - 8.3 g/dL   Albumin 3.3 (*) 3.5 - 5.2 g/dL   AST 11  0 - 37 U/L   ALT 9  0 - 53 U/L   Alkaline Phosphatase 69  39 - 117 U/L   Total Bilirubin 0.9  0.3 - 1.2 mg/dL   GFR calc non  Af Amer 85 (*) >90 mL/min   GFR calc Af Amer >90  >90 mL/min   Comment:  (NOTE)     The eGFR has been calculated using the CKD EPI equation.     This calculation has not been validated in all clinical situations.     eGFR's persistently <90 mL/min signify possible Chronic Kidney     Disease.  LIPASE, BLOOD     Status: None   Collection Time    01/06/14  1:00 PM      Result Value Ref Range   Lipase 14  11 - 59 U/L  URINALYSIS, ROUTINE W REFLEX MICROSCOPIC     Status: Abnormal   Collection Time    01/06/14  2:55 PM      Result Value Ref Range   Color, Urine AMBER (*) YELLOW   Comment: BIOCHEMICALS MAY BE AFFECTED BY COLOR   APPearance CLEAR  CLEAR   Specific Gravity, Urine >1.030 (*) 1.005 - 1.030   pH 5.5  5.0 - 8.0   Glucose, UA NEGATIVE  NEGATIVE mg/dL   Hgb urine dipstick TRACE (*) NEGATIVE   Bilirubin Urine NEGATIVE  NEGATIVE   Ketones, ur NEGATIVE  NEGATIVE mg/dL   Protein, ur 100 (*) NEGATIVE mg/dL   Urobilinogen, UA 0.2  0.0 - 1.0 mg/dL   Nitrite NEGATIVE  NEGATIVE   Leukocytes, UA NEGATIVE  NEGATIVE  URINE MICROSCOPIC-ADD ON     Status: Abnormal   Collection Time    01/06/14  2:55 PM      Result Value Ref Range   WBC, UA 0-2  <3 WBC/hpf   RBC / HPF 7-10  <3 RBC/hpf   Bacteria, UA FEW (*) RARE   Casts HYALINE CASTS (*) NEGATIVE   Urine-Other MUCOUS PRESENT      Radiological Exams on Admission: Ct Abdomen Pelvis Wo Contrast  01/06/2014   CLINICAL DATA:  Left flank pain.  EXAM: CT ABDOMEN AND PELVIS WITHOUT CONTRAST  TECHNIQUE: Multidetector CT imaging of the abdomen and pelvis was performed following the standard protocol without intravenous contrast.  COMPARISON:  None.  FINDINGS: There are moderate to large bilateral pleural effusions. Compressive atelectasis in the lower lobes bilaterally. Small pericardial effusion. Heart is mildly enlarged.  Area of low density noted anteriorly and and inferiorly within the spleen. There is adjacent surrounding stranding near the tip of the spleen and extending into the left anterior para renal space.  Small amount of perihepatic ascites adjacent to the liver tip. This is nonspecific. Contrast CT would be beneficial for further evaluation. Differential considerations would include infection, infarcts or trauma.  Liver, gallbladder, pancreas, adrenals and kidneys have an unremarkable unenhanced appearance. No renal or ureteral stones. No hydronephrosis.  Aorta and iliac vessels are calcified, non aneurysmal. Penile prosthesis noted. Trace free fluid in the cul-de-sac of the pelvis.  Stomach, large and small bowel grossly unremarkable.  No acute bony abnormality.  IMPRESSION: Large bilateral pleural effusions. Compressive atelectasis in the lower lobes. Small pericardial effusion.  Multiple areas of abnormal low-density within the spleen. Adjacent stranding. Differential considerations would include splenic infarcts, infection, or trauma. This area would be better evaluated and visualized with contrast CT.  Small amount of free fluid adjacent to the liver and in the cul-de-sac of the pelvis.  These results were called by telephone at the time of interpretation on 01/06/2014 at 3:02 PM to Dr. [name] , who verbally acknowledged these results.   Electronically Signed   By: Rolm Baptise M.D.  On: 01/06/2014 15:11   Ct Abdomen Pelvis W Contrast  01/06/2014   CLINICAL DATA:  Left upper quadrant pain. Abnormal spleen on noncontrast study.  EXAM: CT ABDOMEN AND PELVIS WITH CONTRAST  TECHNIQUE: Multidetector CT imaging of the abdomen and pelvis was performed using the standard protocol following bolus administration of intravenous contrast.  CONTRAST:  160m OMNIPAQUE IOHEXOL 300 MG/ML  SOLN  COMPARISON:  Noncontrast study earlier today.  FINDINGS: Large bilateral pleural effusions noted. There is compressive atelectasis in the lower lobes. Small pericardial effusion.  Abnormal areas of decreased enhancement noted within the spleen, both the medial superior pole, anterior pole and inferior pole. I suspect these  represent areas of splenic infarction. Superior pole area is wedge-shaped on coronal imaging. There is surrounding stranding adjacent to the inferior pole of the spleen.  Small amount of perihepatic ascites and pelvic ascites. Liver, gallbladder, pancreas, adrenals and kidneys are unremarkable.  Stomach, large and small bowel are unremarkable. No free air or adenopathy. Urinary bladder is unremarkable. Penile prosthesis noted.  No acute bony abnormality.  Atherosclerotic calcifications throughout the infrarenal aorta and iliofemoral vessels.  IMPRESSION: Multiple areas of decreased enhancement throughout the spleen. These are most compatible with splenic infarcts. Exact source is not visualized. No other end organ infarcts noted in the abdomen.  Trace free fluid adjacent to the liver and in the pelvis.  Large bilateral pleural effusions with compressive atelectasis in the lower lobes. Small pericardial effusion.   Electronically Signed   By: KRolm BaptiseM.D.   On: 01/06/2014 16:20    Assessment/Plan Active Problems:   Cardiomyopathy, ischemic   Hx of BKA   Splenic infarct   Splenic infarction  Splenic infarction CT scan of the abdomen shows multiple areas of infarction. Called and discussed with Dr[facility] At this time not sure about the source of infarcts could be embolic versus myeloproliferative. We'll start the patient on heparin protocol and obtain hematology consultation in the morning to assess for possible myeloproliferative disorder. We'll also obtain 2-D echocardiogram to check for any vegetations on the valve, patient had a TEE in September 2014 he might need another TEE if 2-D echo is negative for any vegetation.  Diabetes mellitus Continue sliding scale insulin  Congestive heart failure Patient is euvolemic at this time, continue Lasix 80 mg twice a day  Anemia Patient has chronic anemia, and takes iron tablets.  Coronary artery  disease Continue Coreg, aspirin, statin  Code status: patient is DO NOT RESUSCITATE  Family discussion: no family at bedside    Time Spent on Admission: 734min  [facility] Pager: [phone]/23/2015, 7:04 PM  If 7PM-7AM, please contact night-coverage  [email or web address]  Password [email or web address]

## 2014-01-07 ENCOUNTER — Ambulatory Visit: Payer: 59 | Admitting: Physical Therapy

## 2014-01-07 ENCOUNTER — Inpatient Hospital Stay (HOSPITAL_COMMUNITY): Payer: 59

## 2014-01-07 DIAGNOSIS — J810 Acute pulmonary edema: Secondary | ICD-10-CM

## 2014-01-07 DIAGNOSIS — S88119A Complete traumatic amputation at level between knee and ankle, unspecified lower leg, initial encounter: Secondary | ICD-10-CM

## 2014-01-07 DIAGNOSIS — J90 Pleural effusion, not elsewhere classified: Secondary | ICD-10-CM

## 2014-01-07 DIAGNOSIS — I2510 Atherosclerotic heart disease of native coronary artery without angina pectoris: Secondary | ICD-10-CM

## 2014-01-07 DIAGNOSIS — D509 Iron deficiency anemia, unspecified: Secondary | ICD-10-CM

## 2014-01-07 DIAGNOSIS — Z8673 Personal history of transient ischemic attack (TIA), and cerebral infarction without residual deficits: Secondary | ICD-10-CM

## 2014-01-07 DIAGNOSIS — I2589 Other forms of chronic ischemic heart disease: Secondary | ICD-10-CM

## 2014-01-07 DIAGNOSIS — E119 Type 2 diabetes mellitus without complications: Secondary | ICD-10-CM

## 2014-01-07 LAB — COMPREHENSIVE METABOLIC PANEL
ALK PHOS: 67 U/L (ref 39–117)
ALT: 10 U/L (ref 0–53)
AST: 10 U/L (ref 0–37)
Albumin: 2.9 g/dL — ABNORMAL LOW (ref 3.5–5.2)
BUN: 21 mg/dL (ref 6–23)
CO2: 25 mEq/L (ref 19–32)
Calcium: 8.7 mg/dL (ref 8.4–10.5)
Chloride: 99 mEq/L (ref 96–112)
Creatinine, Ser: 0.9 mg/dL (ref 0.50–1.35)
GFR calc non Af Amer: >90 mL/min (ref >90)
GLUCOSE: 164 mg/dL — AB (ref 70–99)
Potassium: 4.3 mEq/L (ref 3.7–5.3)
Sodium: 137 mEq/L (ref 137–147)
Total Bilirubin: 0.6 mg/dL (ref 0.3–1.2)
Total Protein: 6.2 g/dL (ref 6.0–8.3)

## 2014-01-07 LAB — CARDIOLIPIN ANTIBODIES, IGG, IGM, IGA
Anticardiolipin IgA: 11 APL U/mL — ABNORMAL LOW (ref <22)
Anticardiolipin IgG: 15 GPL U/mL (ref <23)
Anticardiolipin IgM: 2 MPL U/mL — ABNORMAL LOW (ref <11)

## 2014-01-07 LAB — LUPUS ANTICOAGULANT PANEL
DRVVT: 42.8 secs (ref <42.9)
Lupus Anticoagulant: DETECTED — AB
PTT Lupus Anticoagulant: 47.9 secs — ABNORMAL HIGH (ref 28.0–43.0)
PTTLA 4:1 Mix: 44.6 secs — ABNORMAL HIGH (ref 28.0–43.0)
PTTLA Confirmation: 25.4 secs — ABNORMAL HIGH (ref <8.0)

## 2014-01-07 LAB — BETA-2-GLYCOPROTEIN I ABS, IGG/M/A
Beta-2 Glyco I IgG: 7 G Units (ref <20)
Beta-2-Glycoprotein I IgA: 7 A Units (ref <20)
Beta-2-Glycoprotein I IgM: 5 M Units (ref <20)

## 2014-01-07 LAB — RETICULOCYTES
RBC.: 4.54 MIL/uL (ref 4.22–5.81)
RETIC COUNT ABSOLUTE: 68.1 10*3/uL (ref 19.0–186.0)
RETIC CT PCT: 1.5 % (ref 0.4–3.1)

## 2014-01-07 LAB — CBC
HEMATOCRIT: 32 % — AB (ref 39.0–52.0)
HEMOGLOBIN: 9.9 g/dL — AB (ref 13.0–17.0)
MCH: 21.8 pg — AB (ref 26.0–34.0)
MCHC: 30.9 g/dL (ref 30.0–36.0)
MCV: 70.5 fL — AB (ref 78.0–100.0)
Platelets: 178 10*3/uL (ref 150–400)
RBC: 4.54 MIL/uL (ref 4.22–5.81)
RDW: 21 % — AB (ref 11.5–15.5)
WBC: 9.2 10*3/uL (ref 4.0–10.5)

## 2014-01-07 LAB — PROTHROMBIN GENE MUTATION

## 2014-01-07 LAB — FACTOR 5 LEIDEN

## 2014-01-07 LAB — PRO B NATRIURETIC PEPTIDE: PRO B NATRI PEPTIDE: 5530 pg/mL — AB (ref 0–125)

## 2014-01-07 LAB — GLUCOSE, CAPILLARY
GLUCOSE-CAPILLARY: 154 mg/dL — AB (ref 70–99)
Glucose-Capillary: 163 mg/dL — ABNORMAL HIGH (ref 70–99)
Glucose-Capillary: 154 mg/dL — ABNORMAL HIGH (ref 70–99)
Glucose-Capillary: 108 mg/dL — ABNORMAL HIGH (ref 70–99)

## 2014-01-07 LAB — IRON AND TIBC
Iron: 17 ug/dL — ABNORMAL LOW (ref 42–135)
Saturation Ratios: 7 % — ABNORMAL LOW (ref 20–55)
TIBC: 244 ug/dL (ref 215–435)
UIBC: 227 ug/dL (ref 125–400)

## 2014-01-07 LAB — FERRITIN: FERRITIN: 231 ng/mL (ref 22–322)

## 2014-01-07 LAB — PROTEIN C ACTIVITY: Protein C Activity: 114 % (ref 75–133)

## 2014-01-07 LAB — VITAMIN B12: VITAMIN B 12: 387 pg/mL (ref 211–911)

## 2014-01-07 LAB — FOLATE: Folate: 9.2 ng/mL

## 2014-01-07 LAB — PROTEIN S ACTIVITY: Protein S Activity: 75 % (ref 69–129)

## 2014-01-07 LAB — HEPARIN LEVEL (UNFRACTIONATED): Heparin Unfractionated: <0.1 IU/mL — ABNORMAL LOW (ref 0.30–0.70)

## 2014-01-07 LAB — ANTITHROMBIN III: AntiThromb III Func: 88 % (ref 75–120)

## 2014-01-07 MED ORDER — HEPARIN BOLUS VIA INFUSION
2000.0000 [IU] | Freq: Once | INTRAVENOUS | Status: AC
  Administered 2014-01-07: 2000 [IU] via INTRAVENOUS
  Filled 2014-01-07: qty 2000

## 2014-01-07 MED ORDER — FUROSEMIDE 10 MG/ML IJ SOLN
80.0000 mg | Freq: Two times a day (BID) | INTRAMUSCULAR | Status: DC
  Administered 2014-01-08 – 2014-01-09 (×3): 80 mg via INTRAVENOUS
  Filled 2014-01-07 (×3): qty 8

## 2014-01-07 MED ORDER — RIVAROXABAN 15 MG PO TABS
15.0000 mg | ORAL_TABLET | Freq: Two times a day (BID) | ORAL | Status: DC
Start: 2014-01-07 — End: 2014-01-09
  Administered 2014-01-07 – 2014-01-09 (×4): 15 mg via ORAL
  Filled 2014-01-07 (×4): qty 1

## 2014-01-07 MED ORDER — FERUMOXYTOL INJECTION 510 MG/17 ML
510.0000 mg | Freq: Once | INTRAVENOUS | Status: AC
  Administered 2014-01-07: 510 mg via INTRAVENOUS
  Filled 2014-01-07: qty 17

## 2014-01-07 NOTE — Care Management Note (Signed)
UR completed 

## 2014-01-07 NOTE — Progress Notes (Signed)
ANTICOAGULATION CONSULT NOTE - follow up  Pharmacy Consult for Heparin Indication: splenic infarct, ? Due to embolic source  Allergies  Allergen Reactions  . Amoxicillin Nausea And Vomiting   Patient Measurements: Height: 5\' 11"  (180.3 cm) Weight: 215 lb 1.6 oz (97.569 kg) IBW/kg (Calculated) : 75.3  Vital Signs: Temp: 97.6 F (36.4 C) (03/24 0522) Temp src: Oral (03/24 0522) BP: 114/78 mmHg (03/24 0522) Pulse Rate: 75 (03/24 0522)  Labs:  Recent Labs  01/06/14 1300 01/07/14 0505  HGB 10.6* 9.9*  HCT 34.3* 32.0*  PLT 196 178  HEPARINUNFRC  --  <0.10*  CREATININE 1.00 0.90    Estimated Creatinine Clearance: 115.6 ml/min (by C-G formula based on Cr of 0.9).  Medical History: Past Medical History  Diagnosis Date  . Hypertension   . Diabetes mellitus without complication ~3382    last HbA1c ~9  . Hypercholesteremia   . CVA (cerebral infarction) 2011    R hand deficit  . Chronic sinusitis   . Allergic rhinitis   . Chronic cough   . Complication of anesthesia     couldn't swallow and talk  . Stroke     Medications:  Prescriptions prior to admission  Medication Sig Dispense Refill  . ALPRAZolam (XANAX) 0.25 MG tablet Take 1 tablet (0.25 mg total) by mouth 2 (two) times daily as needed for anxiety.  30 tablet  0  . carvedilol (COREG) 25 MG tablet Take 1.5 tablets (37.5 mg total) by mouth 2 (two) times daily with a meal.  90 tablet  2  . ferrous sulfate 325 (65 FE) MG EC tablet Take 325 mg by mouth daily with breakfast.      . furosemide (LASIX) 80 MG tablet Take 1 tablet (80 mg total) by mouth 2 (two) times daily.  60 tablet  1  . hydrALAZINE (APRESOLINE) 10 MG tablet Take 1 tablet (10 mg total) by mouth 3 (three) times daily.  120 tablet  1  . insulin aspart (NOVOLOG) 100 UNIT/ML injection Inject 5 Units into the skin 3 (three) times daily before meals.      . insulin detemir (LEVEMIR) 100 UNIT/ML injection Inject 0.23 mLs (23 Units total) into the skin at  bedtime.  10 mL  12  . Loratadine-Pseudoephedrine (CLARITIN-D 12 HOUR PO) Take 1 tablet by mouth as needed (allergies).       . losartan (COZAAR) 25 MG tablet Take 1 tablet (25 mg total) by mouth daily.  30 tablet  3  . pantoprazole (PROTONIX) 40 MG tablet Take 1 tablet (40 mg total) by mouth daily.  30 tablet  1  . polyethylene glycol (MIRALAX / GLYCOLAX) packet Take 17 g by mouth daily as needed for mild constipation.       . potassium chloride (K-DUR) 10 MEQ tablet Take 1 tablet (10 mEq total) by mouth 2 (two) times daily.  60 tablet  3  . promethazine (PHENERGAN) 25 MG tablet Take 1 tab every 6-8 hours for nausea.  40 tablet  1  . senna-docusate (SENOKOT-S) 8.6-50 MG per tablet Take 1 tablet by mouth as needed.      . simvastatin (ZOCOR) 40 MG tablet Take 1 tablet (40 mg total) by mouth every evening.  30 tablet  1  . tamsulosin (FLOMAX) 0.4 MG CAPS capsule Take 1 capsule (0.4 mg total) by mouth daily after supper.  30 capsule  1    Assessment: 53yo obese male presents to ED with cc of abdominal pain.  Pt has h/o  PVD.  Asked to initiate Heparin IV for splenic infarct due to possible embolic source.  Heparin level is below goal.  Goal of Therapy:  Heparin level 0.3-0.7 units/ml Monitor platelets by anticoagulation protocol: Yes   Plan:   Heparin 2000 units rebolus IV x 1 now  Increase Heparin infusion to 1800 units/hr  Heparin level in 6-8 hours then daily  CBC daily  Hart Robinsons A 01/07/2014,7:46 AM

## 2014-01-07 NOTE — Progress Notes (Signed)
ANTICOAGULATION CONSULT NOTE - follow up  Pharmacy Consult for Heparin Indication: splenic infarct, ? Due to embolic source  Allergies  Allergen Reactions  . Amoxicillin Nausea And Vomiting   Patient Measurements: Height: 5\' 11"  (180.3 cm) Weight: 215 lb 1.6 oz (97.569 kg) IBW/kg (Calculated) : 75.3  Vital Signs: Temp: 97.7 F (36.5 C) (03/24 1441) Temp src: Oral (03/24 0522) BP: 104/68 mmHg (03/24 1441) Pulse Rate: 72 (03/24 1441)  Labs:  Recent Labs  01/06/14 1300 01/07/14 0505 01/07/14 1409  HGB 10.6* 9.9*  --   HCT 34.3* 32.0*  --   PLT 196 178  --   HEPARINUNFRC  --  <0.10* <0.10*  CREATININE 1.00 0.90  --    Estimated Creatinine Clearance: 115.6 ml/min (by C-G formula based on Cr of 0.9).  Medical History: Past Medical History  Diagnosis Date  . Hypertension   . Diabetes mellitus without complication ~2595    last HbA1c ~9  . Hypercholesteremia   . CVA (cerebral infarction) 2011    R hand deficit  . Chronic sinusitis   . Allergic rhinitis   . Chronic cough   . Complication of anesthesia     couldn't swallow and talk  . Stroke    Medications:  Prescriptions prior to admission  Medication Sig Dispense Refill  . ALPRAZolam (XANAX) 0.25 MG tablet Take 1 tablet (0.25 mg total) by mouth 2 (two) times daily as needed for anxiety.  30 tablet  0  . carvedilol (COREG) 25 MG tablet Take 1.5 tablets (37.5 mg total) by mouth 2 (two) times daily with a meal.  90 tablet  2  . ferrous sulfate 325 (65 FE) MG EC tablet Take 325 mg by mouth daily with breakfast.      . furosemide (LASIX) 80 MG tablet Take 1 tablet (80 mg total) by mouth 2 (two) times daily.  60 tablet  1  . hydrALAZINE (APRESOLINE) 10 MG tablet Take 1 tablet (10 mg total) by mouth 3 (three) times daily.  120 tablet  1  . insulin aspart (NOVOLOG) 100 UNIT/ML injection Inject 5 Units into the skin 3 (three) times daily before meals.      . insulin detemir (LEVEMIR) 100 UNIT/ML injection Inject 0.23 mLs  (23 Units total) into the skin at bedtime.  10 mL  12  . Loratadine-Pseudoephedrine (CLARITIN-D 12 HOUR PO) Take 1 tablet by mouth as needed (allergies).       . losartan (COZAAR) 25 MG tablet Take 1 tablet (25 mg total) by mouth daily.  30 tablet  3  . pantoprazole (PROTONIX) 40 MG tablet Take 1 tablet (40 mg total) by mouth daily.  30 tablet  1  . polyethylene glycol (MIRALAX / GLYCOLAX) packet Take 17 g by mouth daily as needed for mild constipation.       . potassium chloride (K-DUR) 10 MEQ tablet Take 1 tablet (10 mEq total) by mouth 2 (two) times daily.  60 tablet  3  . promethazine (PHENERGAN) 25 MG tablet Take 1 tab every 6-8 hours for nausea.  40 tablet  1  . senna-docusate (SENOKOT-S) 8.6-50 MG per tablet Take 1 tablet by mouth as needed.      . simvastatin (ZOCOR) 40 MG tablet Take 1 tablet (40 mg total) by mouth every evening.  30 tablet  1  . tamsulosin (FLOMAX) 0.4 MG CAPS capsule Take 1 capsule (0.4 mg total) by mouth daily after supper.  30 capsule  1   Assessment: 53yo obese male  presents to ED with cc of abdominal pain.  Pt has h/o PVD.  Asked to initiate Heparin IV for splenic infarct due to possible embolic source.  Heparin level remains below goal.  Goal of Therapy:  Heparin level 0.3-0.7 units/ml Monitor platelets by anticoagulation protocol: Yes   Plan:   Heparin 2000 units rebolus IV x 1 now  Increase Heparin infusion to 2100 units/hr  Heparin level in 6 hours then daily  CBC daily  Hart Robinsons A 01/07/2014,3:58 PM

## 2014-01-07 NOTE — Care Management Note (Addendum)
    Page 1 of 1   01/09/2014     2:36:16 PM   CARE MANAGEMENT NOTE 01/09/2014  Patient:  Albert [name], Albert [name]   Account Number:  [id number]  Date Initiated:  01/07/2014  Documentation initiated by:  Claretha Cooper  Subjective/Objective Assessment:   Pt states he lives at home with family. States he currently does not have [place] services. Does not anticipate needing any at DC.     Action/Plan:   Anticipated DC Date:  01/09/2014   Anticipated DC Plan:  [place]  CM consult      Choice offered to / List presented to:          [facility] arranged  HH-1 RN  [place].   Status of service:  Completed, signed off Medicare Important Message given?   (If response is "NO", the following Medicare IM given date fields will be blank) Date Medicare IM given:   Date Additional Medicare IM given:    Discharge Disposition:  [place]  Per UR Regulation:    If discussed at Long Length of Stay Meetings, dates discussed:    Comments:  01/09/14 Claretha Cooper RN BSN CM Pt agreed to [facility] RN for Heart Failure program  01/07/14 Claretha Cooper RN BSN CM

## 2014-01-07 NOTE — Consult Note (Signed)
[facility] Consultation Oncology  Name: Albert [name]      MRN: [id number]    Location: A320/A320-01  Date: 01/07/2014 Time:4:38 PM   REFERRING PHYSICIAN:  Oswald Hillock, MD  REASON FOR CONSULT:  Myeloproliferative disorder   DIAGNOSIS:  Splenic infarct  HISTORY OF PRESENT ILLNESS:   Albert [name] is a 53 year old man who presented to the ED with abdominal pain with a significant past medical history for ischemic cardiomyopathy, uncontrolled DM, peripheral vascular disease, right LE BKA secondary to DM and PVD, HTN, CAD, history of stroke, and others.   I personally reviewed and went over laboratory results with the patient.  The results are noted within this dictation.  I personally reviewed and went over radiographic studies with the patient.  The results are noted within this dictation.    Chart is reviewed.   The patient denies any history of blood clots.  He denies any B symptoms.  He does note LUQ discomfort that comes and goes.  Palpation increases the pain.    He has a history of a stroke that has residual effects including left UE weakness and minimal slurring of speech.   I have reviewed his history and his labs.  He does not appear to have a myeloproliferative disorder based off of labs today with a normal WBC and platelet count.  He is noted to have a microcytic anemia.  With this in mind, we will focus on his splenic infarct.    A hypercoag panel was ordered and drawn already.  He was started on IV heparin.    He is noted to be more sedentary due to his amputation and medical issues.    He denies any blood in his stools, black tarry stools, hematuria, hemoptysis, and B symptoms.   PAST MEDICAL HISTORY:   Past Medical History  Diagnosis Date  . Hypertension   . Diabetes mellitus without complication ~9371    last HbA1c ~9  . Hypercholesteremia   . CVA (cerebral infarction) 2011    R hand deficit  . Chronic sinusitis   . Allergic rhinitis   . Chronic  cough   . Complication of anesthesia     couldn't swallow and talk  . Stroke     ALLERGIES: Allergies  Allergen Reactions  . Amoxicillin Nausea And Vomiting      MEDICATIONS: I have reviewed the patient's current medications.     PAST SURGICAL HISTORY Past Surgical History  Procedure Laterality Date  . 4th toe amputation Left Jan. 2014    4th toe in [place]  . Tonsillectomy and adenoidectomy    . Penile prosthesis implant    . I&d extremity Left 12/08/2012    Procedure: IRRIGATION AND DEBRIDEMENT EXTREMITY;  Surgeon: Mcarthur Rossetti, MD;  Location: [place];  Service: Orthopedics;  Laterality: Left;  . Amputation Left 12/08/2012    Procedure: AMPUTATION DIGIT;  Surgeon: Mcarthur Rossetti, MD;  Location: [place];  Service: Orthopedics;  Laterality: Left;  3rd toe amputation possible 5th toe amputation  . I&d extremity Left 12/11/2012    Procedure: REPEAT I&D LEFT FOOT;  Surgeon: Mcarthur Rossetti, MD;  Location: [place];  Service: Orthopedics;  Laterality: Left;  . Amputation Left 12/11/2012    Procedure: Amputation of 2nd Toe;  Surgeon: Mcarthur Rossetti, MD;  Location: [place];  Service: Orthopedics;  Laterality: Left;  . Bypass graft popliteal to tibial Left 12/13/2012    Procedure: BYPASS GRAFT POPLITEAL TO TIBIAL;  Surgeon: Serafina Mitchell, MD;  Location: [facility] OR;  Service: Vascular;  Laterality: Left;  Left Popliteal to Posterior Tibial Bypass Graft with reversed saphenous vein graft  . Incision and drainage Right 06/25/2013    Procedure: INCISION AND DRAINAGE RIGHT FOOT;  Surgeon: Mcarthur Rossetti, MD;  Location: [facility];  Service: Orthopedics;  Laterality: Right;  . Amputation Right 06/29/2013    Procedure: AMPUTATION BELOW KNEE;  Surgeon: Newt Minion, MD;  Location: [place];  Service: Orthopedics;  Laterality: Right;  . Tee without cardioversion [name] 07/03/2013    Procedure: TRANSESOPHAGEAL ECHOCARDIOGRAM (TEE);  Surgeon: Larey Dresser, MD;  Location: [place];   Service: Cardiovascular;  Laterality: [name];  . Stump revision Right 08/16/2013    Procedure: STUMP REVISION- right Below Knee Amputation;  Surgeon: Newt Minion, MD;  Location: [place];  Service: Orthopedics;  Laterality: Right;    FAMILY HISTORY: Family History  Problem Relation Age of Onset  . Diabetes    . Pancreatic cancer Mother   . Diabetes Mother   . Hyperlipidemia Mother   . Breast cancer Sister   . Depression Maternal Grandmother   . Heart disease Maternal Grandmother   . Depression Maternal Grandfather   . Heart disease Maternal Grandfather   . Depression Paternal Grandmother   . Heart disease Paternal Grandmother   . Depression Paternal Grandfather   . Heart disease Paternal Grandfather     SOCIAL HISTORY:  reports that he has never smoked. He has never used smokeless tobacco. He reports that he does not drink alcohol or use illicit drugs.  PERFORMANCE STATUS: The patient's performance status is 3 - Symptomatic, >50% confined to bed  PHYSICAL EXAM: Most Recent Vital Signs: Blood pressure 127/87, pulse 72, temperature 97.7 F (36.5 C), temperature source Oral, resp. rate 18, height 5' 11"  (1.803 m), weight 215 lb 1.6 oz (97.569 kg), SpO2 95.00%. General appearance: alert, cooperative, appears older than stated age and chronically ill appearing Head: Normocephalic, without obvious abnormality, atraumatic Eyes: negative findings: lids and lashes normal, conjunctivae and sclerae normal and corneas clear Throat: lips, mucosa, and tongue normal; teeth and gums normal Heart: regular rate and rhythm Abdomen: abnormal findings:  marked tenderness in the LUQ Extremities: right BKA Skin: Skin color, texture, turgor normal. No rashes or lesions Neurologic: Right arm weakness with minimally slurred speech  LABORATORY DATA:  Results for orders placed during the hospital encounter of 01/06/14 (from the past 48 hour(s))  CBC WITH DIFFERENTIAL     Status: Abnormal   Collection  Time    01/06/14  1:00 PM      Result Value Ref Range   WBC 11.0 (*) 4.0 - 10.5 K/uL   RBC 4.89  4.22 - 5.81 MIL/uL   Hemoglobin 10.6 (*) 13.0 - 17.0 g/dL   HCT 34.3 (*) 39.0 - 52.0 %   MCV 70.1 (*) 78.0 - 100.0 fL   MCH 21.7 (*) 26.0 - 34.0 pg   MCHC 30.9  30.0 - 36.0 g/dL   RDW 21.0 (*) 11.5 - 15.5 %   Platelets 196  150 - 400 K/uL   Neutrophils Relative % 81 (*) 43 - 77 %   Lymphocytes Relative 7 (*) 12 - 46 %   Monocytes Relative 11  3 - 12 %   Eosinophils Relative 1  0 - 5 %   Basophils Relative 0  0 - 1 %   Neutro Abs 8.9 (*) 1.7 - 7.7 K/uL   Lymphs Abs 0.8  0.7 - 4.0 K/uL   Monocytes Absolute 1.2 (*) 0.1 - 1.0 K/uL   Eosinophils Absolute 0.1  0.0 - 0.7 K/uL   Basophils Absolute 0.0  0.0 - 0.1 K/uL   RBC Morphology SCHISTOCYTES PRESENT (2-5/hpf)     WBC Morphology ATYPICAL LYMPHOCYTES     Smear Review PLATELET COUNT CONFIRMED BY SMEAR     Comment: LARGE PLATELETS PRESENT     GIANT PLATELETS SEEN  COMPREHENSIVE METABOLIC PANEL     Status: Abnormal   Collection Time    01/06/14  1:00 PM      Result Value Ref Range   Sodium 136 (*) 137 - 147 mEq/L   Potassium 4.1  3.7 - 5.3 mEq/L   Chloride 96  96 - 112 mEq/L   CO2 29  19 - 32 mEq/L   Glucose, Bld 198 (*) 70 - 99 mg/dL   BUN 20  6 - 23 mg/dL   Creatinine, Ser 1.00  0.50 - 1.35 mg/dL   Calcium 9.0  8.4 - 10.5 mg/dL   Total Protein 7.1  6.0 - 8.3 g/dL   Albumin 3.3 (*) 3.5 - 5.2 g/dL   AST 11  0 - 37 U/L   ALT 9  0 - 53 U/L   Alkaline Phosphatase 69  39 - 117 U/L   Total Bilirubin 0.9  0.3 - 1.2 mg/dL   GFR calc non Af Amer 85 (*) >90 mL/min   GFR calc Af Amer >90  >90 mL/min   Comment: (NOTE)     The eGFR has been calculated using the CKD EPI equation.     This calculation has not been validated in all clinical situations.     eGFR's persistently <90 mL/min signify possible Chronic Kidney     Disease.  LIPASE, BLOOD     Status: None   Collection Time    01/06/14  1:00 PM      Result Value Ref Range   Lipase  14  11 - 59 U/L  URINALYSIS, ROUTINE W REFLEX MICROSCOPIC     Status: Abnormal   Collection Time    01/06/14  2:55 PM      Result Value Ref Range   Color, Urine AMBER (*) YELLOW   Comment: BIOCHEMICALS MAY BE AFFECTED BY COLOR   APPearance CLEAR  CLEAR   Specific Gravity, Urine >1.030 (*) 1.005 - 1.030   pH 5.5  5.0 - 8.0   Glucose, UA NEGATIVE  NEGATIVE mg/dL   Hgb urine dipstick TRACE (*) NEGATIVE   Bilirubin Urine NEGATIVE  NEGATIVE   Ketones, ur NEGATIVE  NEGATIVE mg/dL   Protein, ur 100 (*) NEGATIVE mg/dL   Urobilinogen, UA 0.2  0.0 - 1.0 mg/dL   Nitrite NEGATIVE  NEGATIVE   Leukocytes, UA NEGATIVE  NEGATIVE  URINE MICROSCOPIC-ADD ON     Status: Abnormal   Collection Time    01/06/14  2:55 PM      Result Value Ref Range   WBC, UA 0-2  <3 WBC/hpf   RBC / HPF 7-10  <3 RBC/hpf   Bacteria, UA FEW (*) RARE   Casts HYALINE CASTS (*) NEGATIVE   Urine-Other MUCOUS PRESENT    ANTITHROMBIN III     Status: None   Collection Time    01/06/14  5:16 PM      Result Value Ref Range   AntiThromb III Func 88  75 - 120 %   Comment: Performed at [place]  Status: None   Collection Time    01/06/14  5:16 PM      Result Value Ref Range   Protein C Activity 114  75 - 133 %   Comment: Performed at [place]     Status: None   Collection Time    01/06/14  5:16 PM      Result Value Ref Range   Protein S Activity 75  69 - 129 %   Comment: Performed at [place]     Status: Abnormal   Collection Time    01/06/14  5:16 PM      Result Value Ref Range   PTT Lupus Anticoagulant 47.9 (*) 28.0 - 43.0 secs   PTTLA Confirmation 25.4 (*) <8.0 secs   PTTLA 4:1 Mix 44.6 (*) 28.0 - 43.0 secs   Drvvt 42.8  <42.9 secs   Drvvt confirmation NOT APPL  <1.11 Ratio   dRVVT Incubated 1:1 Mix NOT APPL  <42.9 secs   Lupus Anticoagulant DETECTED (*) NOT DETECTED   Comment: Performed at [facility]  BETA-2-GLYCOPROTEIN I ABS, IGG/M/A     Status: None   Collection Time    01/06/14  5:16 PM      Result Value Ref Range   Beta-2 Glyco I IgG 7  <20 G Units   Beta-2-Glycoprotein I IgM 5  <20 M Units   Beta-2-Glycoprotein I IgA 7  <20 A Units   Comment: Performed at [facility]  FACTOR 5 LEIDEN     Status: None   Collection Time    01/06/14  5:16 PM      Result Value Ref Range   Recommendations-F5LEID: (NOTE)     Comment: ** NEGATIVE FOR FACTOR V MUTATION **     Reference Interval:     Negative for Factor V Mutation     Interpretation:     Factor V Leiden Gene Mutation (G1691A) is the most common genetic risk     factor for thrombosis and accounts for 94% of individuals classified     as Activated Protein C (APC) resistant.  Testing for Factor V Leiden     Mutation is recommended for all individuals with venous thrombosis or     history of a thrombic event.     Both heterozygotes and homozygotes are at risk for thrombosis, which     is 5-10 fold, and 50-100 fold respectively.     DNA testing for the V R2 polymorphism is recommended for Factor V     Leiden heterozygotes.  Presence of this polymorphism further increases     the risk of venous thrombosis in Factor V Leiden heterozygotes.     DNA from this patient was tested using a FDA approved assay for      Factor V Leiden.     Performed at [facility]     Status: None   Collection Time    01/06/14  5:16 PM      Result Value Ref Range   Recommendations-PTGENE: (NOTE)     Comment: **  NEGATIVE FOR PROTHROMBIN II GENE MUTATION  **     Reference Interval:     Negative for Prothrombin II Gene Mutation     Interpretation:     Prothrombin II 20210A Gene Mutation is a genetic risk factor resulting     in an increase risk for venous thrombosis (five-fold); Myocardial  infarction (two-fold); Cerebrovascular ischemic disease in young     patients (especially females using oral  contraceptives) and pulmonary     embolism.     Both heterozygous and homozygous carriers are at increased risk     although homozygosity is rare.  The incidence of heterozygous carriers     of this mutation is estimated to be 1 to 2.5% for individuals of     European and African descent.     DNA from this patient was tested using a FDA approved assay for            Factor II Prothrombin Mutation.          Performed at [place], IGG, IGM, IGA     Status: Abnormal   Collection Time    01/06/14  5:16 PM      Result Value Ref Range   Anticardiolipin IgG 15  <23 GPL U/mL   Anticardiolipin IgM 2 (*) <11 MPL U/mL   Anticardiolipin IgA 11 (*) <22 APL U/mL   Comment: (NOTE)     Reference Range:  Cardiolipin IgG       Normal                  <23       Low Positive (+)        23-35       Moderate Positive (+)   36-50       High Positive (+)       >50     Reference Range:  Cardiolipin IgM       Normal                  <11       Low Positive (+)        11-20       Moderate Positive (+)   21-30       High Positive (+)       >30     Reference Range:  Cardiolipin IgA       Normal                  <22       Low Positive (+)        22-35       Moderate Positive (+)   36-45       High Positive (+)       >45     Performed at [facility]  GLUCOSE, CAPILLARY     Status: Abnormal   Collection Time    01/06/14  9:25 PM      Result Value Ref Range   Glucose-Capillary 142 (*) 70 - 99 mg/dL   Comment 1 Notify RN    CBC     Status: Abnormal   Collection Time    01/07/14  5:05 AM      Result Value Ref Range   WBC 9.2  4.0 - 10.5 K/uL   RBC 4.54  4.22 - 5.81 MIL/uL   Hemoglobin 9.9 (*) 13.0 - 17.0 g/dL   HCT 32.0 (*) 39.0 - 52.0 %   MCV 70.5 (*) 78.0 - 100.0 fL   MCH 21.8 (*) 26.0 - 34.0 pg   MCHC 30.9  30.0 - 36.0 g/dL   RDW 21.0 (*) 11.5 - 15.5 %   Platelets 178  150 - 400 K/uL  COMPREHENSIVE METABOLIC PANEL     Status: Abnormal   Collection Time  01/07/14  5:05 AM      Result Value Ref Range   Sodium 137  137 - 147 mEq/L   Potassium 4.3  3.7 - 5.3 mEq/L   Chloride 99  96 - 112 mEq/L   CO2 25  19 - 32 mEq/L   Glucose, Bld 164 (*) 70 - 99 mg/dL   BUN 21  6 - 23 mg/dL   Creatinine, Ser 0.90  0.50 - 1.35 mg/dL   Calcium 8.7  8.4 - 10.5 mg/dL   Total Protein 6.2  6.0 - 8.3 g/dL   Albumin 2.9 (*) 3.5 - 5.2 g/dL   AST 10  0 - 37 U/L   ALT 10  0 - 53 U/L   Alkaline Phosphatase 67  39 - 117 U/L   Total Bilirubin 0.6  0.3 - 1.2 mg/dL   GFR calc non Af Amer >90  >90 mL/min   GFR calc Af Amer >90  >90 mL/min   Comment: (NOTE)     The eGFR has been calculated using the CKD EPI equation.     This calculation has not been validated in all clinical situations.     eGFR's persistently <90 mL/min signify possible Chronic Kidney     Disease.  HEPARIN LEVEL (UNFRACTIONATED)     Status: Abnormal   Collection Time    01/07/14  5:05 AM      Result Value Ref Range   Heparin Unfractionated <0.10 (*) 0.30 - 0.70 IU/mL   Comment:            IF HEPARIN RESULTS ARE BELOW     EXPECTED VALUES, AND PATIENT     DOSAGE HAS BEEN CONFIRMED,     SUGGEST FOLLOW UP TESTING     OF ANTITHROMBIN III LEVELS.  VITAMIN B12     Status: None   Collection Time    01/07/14  5:06 AM      Result Value Ref Range   Vitamin B-12 387  211 - 911 pg/mL   Comment: Performed at [place] [facility]     Status: Abnormal   Collection Time    01/07/14  5:06 AM      Result Value Ref Range   Iron 17 (*) 42 - 135 ug/dL   TIBC 244  215 - 435 ug/dL   Saturation Ratios 7 (*) 20 - 55 %   UIBC 227  125 - 400 ug/dL   Comment: Performed at [place]     Status: None   Collection Time    01/07/14  5:06 AM      Result Value Ref Range   Ferritin 231  22 - 322 ng/mL   Comment: Performed at [place]     Status: None   Collection Time    01/07/14  5:06 AM      Result Value Ref Range   Folate 9.2     Comment: (NOTE)      Reference Ranges            Deficient:       0.4 - 3.3 ng/mL            Indeterminate:   3.4 - 5.4 ng/mL            Normal:              > 5.4 ng/mL     Performed at [place]  Status: None   Collection Time    01/07/14  5:06 AM      Result Value Ref Range   Retic Ct Pct 1.5  0.4 - 3.1 %   RBC. 4.54  4.22 - 5.81 MIL/uL   Retic Count, Manual 68.1  19.0 - 186.0 K/uL  GLUCOSE, CAPILLARY     Status: Abnormal   Collection Time    01/07/14  7:42 AM      Result Value Ref Range   Glucose-Capillary 154 (*) 70 - 99 mg/dL   Comment 1 Notify RN    GLUCOSE, CAPILLARY     Status: Abnormal   Collection Time    01/07/14 11:37 AM      Result Value Ref Range   Glucose-Capillary 163 (*) 70 - 99 mg/dL   Comment 1 Notify RN    HEPARIN LEVEL (UNFRACTIONATED)     Status: Abnormal   Collection Time    01/07/14  2:09 PM      Result Value Ref Range   Heparin Unfractionated <0.10 (*) 0.30 - 0.70 IU/mL   Comment:            IF HEPARIN RESULTS ARE BELOW     EXPECTED VALUES, AND PATIENT     DOSAGE HAS BEEN CONFIRMED,     SUGGEST FOLLOW UP TESTING     OF ANTITHROMBIN III LEVELS.      RADIOGRAPHY: Ct Abdomen Pelvis Wo Contrast  01/06/2014   CLINICAL DATA:  Left flank pain.  EXAM: CT ABDOMEN AND PELVIS WITHOUT CONTRAST  TECHNIQUE: Multidetector CT imaging of the abdomen and pelvis was performed following the standard protocol without intravenous contrast.  COMPARISON:  None.  FINDINGS: There are moderate to large bilateral pleural effusions. Compressive atelectasis in the lower lobes bilaterally. Small pericardial effusion. Heart is mildly enlarged.  Area of low density noted anteriorly and and inferiorly within the spleen. There is adjacent surrounding stranding near the tip of the spleen and extending into the left anterior para renal space. Small amount of perihepatic ascites adjacent to the liver tip. This is nonspecific. Contrast CT would be beneficial for further  evaluation. Differential considerations would include infection, infarcts or trauma.  Liver, gallbladder, pancreas, adrenals and kidneys have an unremarkable unenhanced appearance. No renal or ureteral stones. No hydronephrosis.  Aorta and iliac vessels are calcified, non aneurysmal. Penile prosthesis noted. Trace free fluid in the cul-de-sac of the pelvis.  Stomach, large and small bowel grossly unremarkable.  No acute bony abnormality.  IMPRESSION: Large bilateral pleural effusions. Compressive atelectasis in the lower lobes. Small pericardial effusion.  Multiple areas of abnormal low-density within the spleen. Adjacent stranding. Differential considerations would include splenic infarcts, infection, or trauma. This area would be better evaluated and visualized with contrast CT.  Small amount of free fluid adjacent to the liver and in the cul-de-sac of the pelvis.  These results were called by telephone at the time of interpretation on 01/06/2014 at 3:02 PM to Dr. [name] , who verbally acknowledged these results.   Electronically Signed   By: Rolm Baptise M.D.   On: 01/06/2014 15:11   Ct Abdomen Pelvis W Contrast  01/06/2014   CLINICAL DATA:  Left upper quadrant pain. Abnormal spleen on noncontrast study.  EXAM: CT ABDOMEN AND PELVIS WITH CONTRAST  TECHNIQUE: Multidetector CT imaging of the abdomen and pelvis was performed using the standard protocol following bolus administration of intravenous contrast.  CONTRAST:  176m OMNIPAQUE IOHEXOL 300 MG/ML  SOLN  COMPARISON:  Noncontrast  study earlier today.  FINDINGS: Large bilateral pleural effusions noted. There is compressive atelectasis in the lower lobes. Small pericardial effusion.  Abnormal areas of decreased enhancement noted within the spleen, both the medial superior pole, anterior pole and inferior pole. I suspect these represent areas of splenic infarction. Superior pole area is wedge-shaped on coronal imaging. There is surrounding stranding  adjacent to the inferior pole of the spleen.  Small amount of perihepatic ascites and pelvic ascites. Liver, gallbladder, pancreas, adrenals and kidneys are unremarkable.  Stomach, large and small bowel are unremarkable. No free air or adenopathy. Urinary bladder is unremarkable. Penile prosthesis noted.  No acute bony abnormality.  Atherosclerotic calcifications throughout the infrarenal aorta and iliofemoral vessels.  IMPRESSION: Multiple areas of decreased enhancement throughout the spleen. These are most compatible with splenic infarcts. Exact source is not visualized. No other end organ infarcts noted in the abdomen.  Trace free fluid adjacent to the liver and in the pelvis.  Large bilateral pleural effusions with compressive atelectasis in the lower lobes. Small pericardial effusion.   Electronically Signed   By: Rolm Baptise M.D.   On: 01/06/2014 16:20       ASSESSMENT:  1. Splenic infarcts 2. Microcytic anemia, suspect element of iron deficiency with falsely normal ferritin 3. Bilateral pleural effusion, XPVD 4. CAD 5. Uncontrolled DM 6. Right below the knee amputation 7. History of stroke  Patient Active Problem List   Diagnosis Date Noted  . Splenic infarct 01/06/2014  . Splenic infarction 01/06/2014  . Acute systolic CHF (congestive heart failure) 08/03/2013  . Hx of right BKA 07/26/2013  . Anemia 07/15/2013  . Hyperkalemia 07/15/2013  . Hyposmolality and/or hyponatremia 07/15/2013  . Acute respiratory failure 07/15/2013  . Mitral regurgitation 07/15/2013  . Oliguria 07/15/2013  . Bacteremia due to Staphylococcus aureus 07/15/2013  . Hyperchloremia 07/15/2013  . Acute kidney failure 07/14/2013  . Hypotension, unspecified 07/14/2013  . Acute systolic heart failure 49/20/1007  . Acute pulmonary edema 07/11/2013  . Pneumonia, organism unspecified 07/11/2013  . Hypoxemia 07/11/2013  . Hx of BKA 07/04/2013  . Cardiomyopathy, ischemic 07/03/2013  . Coronary atherosclerosis of  native coronary artery 07/01/2013  . NSTEMI (non-ST elevated myocardial infarction) 06/26/2013  . AKI (acute kidney injury) 06/25/2013  . Foot abscess, right 06/25/2013  . Shock circulatory 06/25/2013  . Dehydration with hyponatremia 06/25/2013  . Leukocytosis, unspecified 06/25/2013  . Sepsis(995.91) 06/25/2013  . Aftercare following surgery of the circulatory system, [place] 02/11/2013  . Peripheral vascular disease, unspecified 12/31/2012  . Diabetes mellitus out of control 12/17/2012  . Essential hypertension, benign 12/17/2012  . Atherosclerotic peripheral vascular disease 12/17/2012  . Gangrene 12/08/2012  . Hypercholesteremia      PLAN:  1. I personally reviewed and went over laboratory results with the patient.  The results are noted within this dictation. 2. I personally reviewed and went over radiographic studies with the patient.  The results are noted within this dictation.   3. Chart reviewed 4. IV Feraheme 510 mg  5. Labs tomorrow: CBC diff 6. Labs today: Erythropoietin level 7. MRA abd w and wo contrast paying particular attention to superior mesenteric artery and splenic artery to evaluate for cause of splenic infarct 8.  Recommend considering transition to Xarelto 15 mg BID x 21 days and then 20 mg daily thereafter.  No absolute contraindication noted today on problem list and clinically.  9.  Recommend anticoagulation for 3-6 months, prefer 6 months of anticoagulation at this time. 10. 2D echo ordered  and we recommend completion of this test. 11. We will follow along as an inpatient and assist in patient care.   All questions were answered. The patient knows to call the clinic with any problems, questions or concerns. We can certainly see the patient much sooner if necessary.  Patient and plan discussed with Dr. Farrel Gobble and he is in agreement with the aforementioned.    Heraclio Seidman 01/07/2014

## 2014-01-07 NOTE — Progress Notes (Signed)
[facility] PROGRESS NOTE  OSHA ERRICO ONG:[id number] DOB: 01-Feb-1961 DOA: 01/06/2014 PCP: Minus Breeding, MD  Assessment/Plan: 1. Splenic infarcts. Appreciate hematology assistance. Patient is on heparin, will transition to Xarelto. Further imaging including MRI of the abdomen has been ordered. 2D Echocardiogram has also been ordered. 2. Diabetes. Continue outpatient regimen. 3. Congestive heart failure based on echocardiogram from 06/2013, patient had a low-normal ejection fraction. At this time, he does have evidence of volume overload. Will change Lasix to IV. Continue to monitor intake and output. 4. Anemia, likely iron deficiency. Patient will receive intravenous iron infusion. I suspect this is from nutritional deficiency. We'll check stool for occult blood 5. Coronary artery disease. Continue Coreg, aspirin statin.  Code Status: DNR Family Communication: discussed with patient Disposition Plan: discharge home once improved   Consultants:  hematology  Procedures:    Antibiotics:    HPI/Subjective: Feels that abdominal pain has improved since admission  Objective: Filed Vitals:   01/07/14 1558  BP: 127/87  Pulse:   Temp:   Resp:     Intake/Output Summary (Last 24 hours) at 01/07/14 1733 Last data filed at 01/07/14 1252  Gross per 24 hour  Intake      0 ml  Output   1200 ml  Net  -1200 ml   Filed Weights   01/06/14 1948  Weight: 97.569 kg (215 lb 1.6 oz)    Exam:   General:  NAD  Cardiovascular: S1, S2 RRR  Respiratory: diminished breath sounds at bases  Abdomen: soft, tender on left side, bs+  Musculoskeletal: RLE BKA, LLE 1-2+ edema    Data Reviewed: Basic Metabolic Panel:  Recent Labs Lab 01/03/14 1339 01/06/14 1300 01/07/14 0505  NA 136 136* 137  K 3.4* 4.1 4.3  CL 101 96 99  CO2 24 29 25   GLUCOSE 148* 198* 164*  BUN 19 20 21   CREATININE 1.0 1.00 0.90  CALCIUM 9.0 9.0 8.7   Liver Function Tests:  Recent Labs Lab  01/06/14 1300 01/07/14 0505  AST 11 10  ALT 9 10  ALKPHOS 69 67  BILITOT 0.9 0.6  PROT 7.1 6.2  ALBUMIN 3.3* 2.9*    Recent Labs Lab 01/06/14 1300  LIPASE 14   No results found for this basename: AMMONIA,  in the last 168 hours CBC:  Recent Labs Lab 01/03/14 1339 01/06/14 1300 01/07/14 0505  WBC 7.9 11.0* 9.2  NEUTROABS  --  8.9*  --   HGB 10.6* 10.6* 9.9*  HCT 34.4* 34.3* 32.0*  MCV 69.9* 70.1* 70.5*  PLT 280.0 196 178   Cardiac Enzymes: No results found for this basename: CKTOTAL, CKMB, CKMBINDEX, TROPONINI,  in the last 168 hours BNP (last 3 results)  Recent Labs  07/11/13 1630 07/26/13 10/01/13 1157  PROBNP 23575.0* 30099.0* 1302.0*   CBG:  Recent Labs Lab 01/06/14 2125 01/07/14 0742 01/07/14 1137 01/07/14 1700  GLUCAP 142* 154* 163* 154*    No results found for this or any previous visit (from the past 240 hour(s)).   Studies: Ct Abdomen Pelvis Wo Contrast  01/06/2014   CLINICAL DATA:  Left flank pain.  EXAM: CT ABDOMEN AND PELVIS WITHOUT CONTRAST  TECHNIQUE: Multidetector CT imaging of the abdomen and pelvis was performed following the standard protocol without intravenous contrast.  COMPARISON:  None.  FINDINGS: There are moderate to large bilateral pleural effusions. Compressive atelectasis in the lower lobes bilaterally. Small pericardial effusion. Heart is mildly enlarged.  Area of low density noted anteriorly and and inferiorly  within the spleen. There is adjacent surrounding stranding near the tip of the spleen and extending into the left anterior para renal space. Small amount of perihepatic ascites adjacent to the liver tip. This is nonspecific. Contrast CT would be beneficial for further evaluation. Differential considerations would include infection, infarcts or trauma.  Liver, gallbladder, pancreas, adrenals and kidneys have an unremarkable unenhanced appearance. No renal or ureteral stones. No hydronephrosis.  Aorta and iliac vessels are  calcified, non aneurysmal. Penile prosthesis noted. Trace free fluid in the cul-de-sac of the pelvis.  Stomach, large and small bowel grossly unremarkable.  No acute bony abnormality.  IMPRESSION: Large bilateral pleural effusions. Compressive atelectasis in the lower lobes. Small pericardial effusion.  Multiple areas of abnormal low-density within the spleen. Adjacent stranding. Differential considerations would include splenic infarcts, infection, or trauma. This area would be better evaluated and visualized with contrast CT.  Small amount of free fluid adjacent to the liver and in the cul-de-sac of the pelvis.  These results were called by telephone at the time of interpretation on 01/06/2014 at 3:02 PM to Dr. [name] , who verbally acknowledged these results.   Electronically Signed   By: Rolm Baptise M.D.   On: 01/06/2014 15:11   Ct Abdomen Pelvis W Contrast  01/06/2014   CLINICAL DATA:  Left upper quadrant pain. Abnormal spleen on noncontrast study.  EXAM: CT ABDOMEN AND PELVIS WITH CONTRAST  TECHNIQUE: Multidetector CT imaging of the abdomen and pelvis was performed using the standard protocol following bolus administration of intravenous contrast.  CONTRAST:  143mL OMNIPAQUE IOHEXOL 300 MG/ML  SOLN  COMPARISON:  Noncontrast study earlier today.  FINDINGS: Large bilateral pleural effusions noted. There is compressive atelectasis in the lower lobes. Small pericardial effusion.  Abnormal areas of decreased enhancement noted within the spleen, both the medial superior pole, anterior pole and inferior pole. I suspect these represent areas of splenic infarction. Superior pole area is wedge-shaped on coronal imaging. There is surrounding stranding adjacent to the inferior pole of the spleen.  Small amount of perihepatic ascites and pelvic ascites. Liver, gallbladder, pancreas, adrenals and kidneys are unremarkable.  Stomach, large and small bowel are unremarkable. No free air or adenopathy. Urinary bladder  is unremarkable. Penile prosthesis noted.  No acute bony abnormality.  Atherosclerotic calcifications throughout the infrarenal aorta and iliofemoral vessels.  IMPRESSION: Multiple areas of decreased enhancement throughout the spleen. These are most compatible with splenic infarcts. Exact source is not visualized. No other end organ infarcts noted in the abdomen.  Trace free fluid adjacent to the liver and in the pelvis.  Large bilateral pleural effusions with compressive atelectasis in the lower lobes. Small pericardial effusion.   Electronically Signed   By: Rolm Baptise M.D.   On: 01/06/2014 16:20   Dg Chest Port 1 View  01/07/2014   CLINICAL DATA:  pleural effusion  EXAM: PORTABLE CHEST - 1 VIEW  COMPARISON:  DG CHEST 2 VIEW dated 08/02/2013  FINDINGS: Low lung volumes. Cardiac silhouette is enlarged. There is blunting of the costophrenic angles. Areas of increased density projects within the lung bases. There is prominence of the interstitial markings and areas of peribronchial cuffing. Osseous structures unremarkable.  IMPRESSION: Interstitial infiltrates likely reflecting pulmonary edema.  Blunted costophrenic angles consistent with small effusions left greater than right  Stable cardiomegaly   Electronically Signed   By: Margaree Mackintosh M.D.   On: 01/07/2014 17:28    Scheduled Meds: . carvedilol  37.5 mg Oral BID WC  .  ferumoxytol  510 mg Intravenous Once  . furosemide  80 mg Oral BID  . hydrALAZINE  10 mg Oral 3 times per day  . insulin aspart  0-9 Units Subcutaneous TID WC  . insulin aspart  5 Units Subcutaneous TID AC  . insulin detemir  23 Units Subcutaneous QHS  . losartan  25 mg Oral Daily  . potassium chloride  10 mEq Oral BID  . simvastatin  40 mg Oral QPM  . sodium chloride  3 mL Intravenous Q12H  . sodium chloride  3 mL Intravenous Q12H  . tamsulosin  0.4 mg Oral QPC supper   Continuous Infusions: . heparin 2,100 Units/hr (01/07/14 1602)    Active Problems:    Cardiomyopathy, ischemic   Hx of BKA   Splenic infarct   Splenic infarction    Time spent: 48mins    MEMON,JEHANZEB  [facility] Pager [phone]. If 7PM-7AM, please contact night-coverage at [email or web address], password [facility] 01/07/2014, 5:33 PM  LOS: 1 day

## 2014-01-08 ENCOUNTER — Inpatient Hospital Stay (HOSPITAL_COMMUNITY): Payer: 59

## 2014-01-08 ENCOUNTER — Encounter: Payer: 59 | Admitting: Physical Therapy

## 2014-01-08 ENCOUNTER — Encounter (HOSPITAL_COMMUNITY): Payer: Self-pay | Admitting: Radiology

## 2014-01-08 DIAGNOSIS — I5021 Acute systolic (congestive) heart failure: Secondary | ICD-10-CM

## 2014-01-08 DIAGNOSIS — I319 Disease of pericardium, unspecified: Secondary | ICD-10-CM

## 2014-01-08 DIAGNOSIS — I509 Heart failure, unspecified: Secondary | ICD-10-CM

## 2014-01-08 LAB — HOMOCYSTEINE: Homocysteine: 12.9 umol/L (ref 4.0–15.4)

## 2014-01-08 LAB — PROTEIN S, TOTAL: Protein S Ag, Total: 81 % (ref 60–150)

## 2014-01-08 LAB — BASIC METABOLIC PANEL
BUN: 25 mg/dL — AB (ref 6–23)
CALCIUM: 8.7 mg/dL (ref 8.4–10.5)
CO2: 26 mEq/L (ref 19–32)
CREATININE: 1.04 mg/dL (ref 0.50–1.35)
Chloride: 101 mEq/L (ref 96–112)
GFR calc non Af Amer: 81 mL/min — ABNORMAL LOW (ref >90)
Glucose, Bld: 113 mg/dL — ABNORMAL HIGH (ref 70–99)
Potassium: 4 mEq/L (ref 3.7–5.3)
Sodium: 141 mEq/L (ref 137–147)

## 2014-01-08 LAB — CBC WITH DIFFERENTIAL/PLATELET
Basophils Absolute: 0 10*3/uL (ref 0.0–0.1)
Basophils Relative: 0 % (ref 0–1)
EOS ABS: 0.2 10*3/uL (ref 0.0–0.7)
EOS PCT: 2 % (ref 0–5)
HCT: 34.7 % — ABNORMAL LOW (ref 39.0–52.0)
Hemoglobin: 10.8 g/dL — ABNORMAL LOW (ref 13.0–17.0)
Lymphocytes Relative: 16 % (ref 12–46)
Lymphs Abs: 1.4 10*3/uL (ref 0.7–4.0)
MCH: 22.1 pg — AB (ref 26.0–34.0)
MCHC: 31.1 g/dL (ref 30.0–36.0)
MCV: 71 fL — AB (ref 78.0–100.0)
Monocytes Absolute: 1 10*3/uL (ref 0.1–1.0)
Monocytes Relative: 11 % (ref 3–12)
Neutro Abs: 6.5 10*3/uL (ref 1.7–7.7)
Neutrophils Relative %: 71 % (ref 43–77)
PLATELETS: 246 10*3/uL (ref 150–400)
RBC: 4.89 MIL/uL (ref 4.22–5.81)
RDW: 20.7 % — ABNORMAL HIGH (ref 11.5–15.5)
WBC: 9.1 10*3/uL (ref 4.0–10.5)

## 2014-01-08 LAB — GLUCOSE, CAPILLARY
GLUCOSE-CAPILLARY: 94 mg/dL (ref 70–99)
GLUCOSE-CAPILLARY: 167 mg/dL — AB (ref 70–99)
Glucose-Capillary: 85 mg/dL (ref 70–99)
Glucose-Capillary: 114 mg/dL — ABNORMAL HIGH (ref 70–99)
Glucose-Capillary: 135 mg/dL — ABNORMAL HIGH (ref 70–99)

## 2014-01-08 LAB — PROTEIN C, TOTAL: Protein C, Total: 78 % (ref 72–160)

## 2014-01-08 MED ORDER — IOHEXOL 350 MG/ML SOLN
100.0000 mL | Freq: Once | INTRAVENOUS | Status: AC | PRN
  Administered 2014-01-08: 100 mL via INTRAVENOUS

## 2014-01-08 NOTE — Plan of Care (Signed)
Albert Capers, LPN confirmed with Dr. Dayle Points office that pt does have metal in place from prior penile implant. MRI and doctor notified.  MRI cancelled and CT of Abd scheduled instead.

## 2014-01-08 NOTE — Progress Notes (Signed)
[facility] PROGRESS NOTE  Albert [name] NFA:[id number] DOB: [birth year] DOA: 01/06/2014 PCP: Minus Breeding, MD  Assessment/Plan: 1. Splenic infarcts. Appreciate hematology assistance. Patient is Xarelto for anticoagulation. CT angio of the abdomen did not reveal any source of patient's infarcts. 2D echo done today does not show any source of emboli. Abdominal pain is improving.  Hematology following. 2. Diabetes. Continue outpatient regimen. 3. Acute on chronic combined CHF.  EF done on echocardiogram shows EF of 40-45% with grade 2 diastolic dysfunction. Patient was showing signs of volume overload and so lasix was changed from po to IV.  He has had good urine output with IV lasix.  Continue current treatments for today. 4. Anemia, likely iron deficiency. Patient will receive intravenous iron infusion. I suspect this is from nutritional deficiency. We'll check stool for occult blood 5. Coronary artery disease. Continue Coreg, aspirin statin.  Code Status: DNR Family Communication: discussed with patient Disposition Plan: discharge home once improved   Consultants:  hematology  Procedures:    Antibiotics:    HPI/Subjective: Feeling better today.  Denies shortness of breath or chest pain.  Objective: Filed Vitals:   01/08/14 1333  BP: 122/78  Pulse: 84  Temp: 97.6 F (36.4 C)  Resp: 20    Intake/Output Summary (Last 24 hours) at 01/08/14 1805 Last data filed at 01/08/14 1228  Gross per 24 hour  Intake    480 ml  Output   2500 ml  Net  -2020 ml   Filed Weights   01/06/14 1948 01/08/14 0510  Weight: 97.569 kg (215 lb 1.6 oz) 98.839 kg (217 lb 14.4 oz)    Exam:   General:  NAD  Cardiovascular: S1, S2 RRR  Respiratory: diminished breath sounds at bases  Abdomen: soft, tender on left side, bs+  Musculoskeletal: RLE BKA, LLE 1+ edema    Data Reviewed: Basic Metabolic Panel:  Recent Labs Lab 01/03/14 1339 01/06/14 1300 01/07/14 0505  01/08/14 0529  NA 136 136* 137 141  K 3.4* 4.1 4.3 4.0  CL 101 96 99 101  CO2 24 29 25 26   GLUCOSE 148* 198* 164* 113*  BUN 19 20 21  25*  CREATININE 1.0 1.00 0.90 1.04  CALCIUM 9.0 9.0 8.7 8.7   Liver Function Tests:  Recent Labs Lab 01/06/14 1300 01/07/14 0505  AST 11 10  ALT 9 10  ALKPHOS 69 67  BILITOT 0.9 0.6  PROT 7.1 6.2  ALBUMIN 3.3* 2.9*    Recent Labs Lab 01/06/14 1300  LIPASE 14   No results found for this basename: AMMONIA,  in the last 168 hours CBC:  Recent Labs Lab 01/03/14 1339 01/06/14 1300 01/07/14 0505 01/08/14 0529  WBC 7.9 11.0* 9.2 9.1  NEUTROABS  --  8.9*  --  6.5  HGB 10.6* 10.6* 9.9* 10.8*  HCT 34.4* 34.3* 32.0* 34.7*  MCV 69.9* 70.1* 70.5* 71.0*  PLT 280.0 196 178 246   Cardiac Enzymes: No results found for this basename: CKTOTAL, CKMB, CKMBINDEX, TROPONINI,  in the last 168 hours BNP (last 3 results)  Recent Labs  07/26/13 10/01/13 1157 01/07/14 1730  PROBNP 30099.0* 1302.0* 5530.0*   CBG:  Recent Labs Lab 01/07/14 2141 01/08/14 0741 01/08/14 1156 01/08/14 1317 01/08/14 1652  GLUCAP 108* 85 114* 135* 94    No results found for this or any previous visit (from the past 240 hour(s)).   Studies: Ct Angio Abdomen W/cm &/or Wo Contrast  01/08/2014   CLINICAL DATA:  Left upper abdominal pain, acute  splenic infarctions  EXAM: CT ANGIOGRAPHY ABDOMEN  TECHNIQUE: Multidetector CT imaging of the abdomen and pelvis using the standard protocol during bolus administration of intravenous contrast. Multiplanar reconstructed images obtained and reviewed to evaluate the vascular anatomy.  CONTRAST:  155mL OMNIPAQUE IOHEXOL 350 MG/ML SOLN  COMPARISON:  01/06/2014  FINDINGS: Large dependent pleural effusions noted with bibasilar and lingula compressive atelectasis. The pleural effusions are slightly larger compared to the prior study. Small dependent pericardial effusion noted. No hiatal hernia.  Abdomen: Abdominal aorta demonstrates  calcific atherosclerosis of the infrarenal segment and bifurcation without aneurysm, dissection or occlusion. No retroperitoneal hemorrhage. Celiac is widely patent. Celiac branches including the hepatic, left gastric, splenic arteries are all patent. SMA origin is widely patent. Main SMA trunk is patent in the central mesentery. Main renal arteries are well visualized and patent bilaterally. IMA is patent off of the inferior aorta.  Negative for significant mesenteric or renal vascular occlusive process. No identifiable embolic source to account for the evolving subacute splenic infarcts.  Nonvascular findings: Small amount of abdominal free fluid/ascites along the pericolic gutters bilaterally.  Liver, collapsed gallbladder, biliary system, pancreas, adrenal glands, and kidneys are within normal limits for age and demonstrate no acute process.  Similar wedge-shaped hypodense areas in the spleen compatible with evolving splenic infarcts.  No subcapsular acute hemorrhage or hematoma.  Negative for bowel obstruction, dilatation, ileus, or free air.  No abdominal fluid collection, abscess, or large hematoma.  Minor degenerative changes of the spine diffusely.  IMPRESSION: Patent mesenteric vasculature without an identifiable embolic source to account for the subacute splenic infarcts.  Large pleural effusions with associated compressive bibasilar and lingular atelectasis. Trace pericardial effusion.  Small amount of intra-abdominal free fluid/ascites along the pericolic gutters as before, unchanged   Electronically Signed   By: Daryll Brod M.D.   On: 01/08/2014 16:20   Dg Chest Port 1 View  01/07/2014   CLINICAL DATA:  pleural effusion  EXAM: PORTABLE CHEST - 1 VIEW  COMPARISON:  DG CHEST 2 VIEW dated 08/02/2013  FINDINGS: Low lung volumes. Cardiac silhouette is enlarged. There is blunting of the costophrenic angles. Areas of increased density projects within the lung bases. There is prominence of the  interstitial markings and areas of peribronchial cuffing. Osseous structures unremarkable.  IMPRESSION: Interstitial infiltrates likely reflecting pulmonary edema.  Blunted costophrenic angles consistent with small effusions left greater than right  Stable cardiomegaly   Electronically Signed   By: Margaree Mackintosh M.D.   On: 01/07/2014 17:28    Scheduled Meds: . carvedilol  37.5 mg Oral BID WC  . furosemide  80 mg Intravenous BID  . hydrALAZINE  10 mg Oral 3 times per day  . insulin aspart  0-9 Units Subcutaneous TID WC  . insulin aspart  5 Units Subcutaneous TID AC  . insulin detemir  23 Units Subcutaneous QHS  . losartan  25 mg Oral Daily  . potassium chloride  10 mEq Oral BID  . Rivaroxaban  15 mg Oral BID WC  . simvastatin  40 mg Oral QPM  . sodium chloride  3 mL Intravenous Q12H  . sodium chloride  3 mL Intravenous Q12H  . tamsulosin  0.4 mg Oral QPC supper   Continuous Infusions:    Active Problems:   Cardiomyopathy, ischemic   Hx of BKA   Splenic infarct   Splenic infarction    Time spent: 67mins    MEMON,JEHANZEB  [facility] Pager [phone]. If 7PM-7AM, please contact night-coverage at [email or web address],  password [facility] 01/08/2014, 6:05 PM  LOS: 2 days

## 2014-01-08 NOTE — Clinical Documentation Improvement (Signed)
Please clarify acuity and type CHF. Thank you.  Possible Clinical Conditions?  Chronic Systolic Congestive Heart Failure Chronic Diastolic Congestive Heart Failure Chronic Systolic & Diastolic Congestive Heart Failure Acute Systolic Congestive Heart Failure Acute Diastolic Congestive Heart Failure Acute Systolic & Diastolic Congestive Heart Failure Acute on Chronic Systolic Congestive Heart Failure Acute on Chronic Diastolic Congestive Heart Failure Acute on Chronic Systolic & Diastolic Congestive Heart Failure Other Condition________________________________________ Cannot Clinically Determine  Supporting Information:  Risk Factors: H&P Assessment/Plan: "Congestive heart failure Patient is euvolemic at this time, continue Lasix 80 mg twice a day" History of Acute Systolic CHF (85/63/14) "currently maintained on Lasix." ( 80mg /bid) History of Diabetes and Hypertension  Diagnostics: 324 BNP: 5530.0 3/24 CXR: IMPRESSION:  Interstitial infiltrates likely reflecting pulmonary edema. Blunted costophrenic angles consistent with small effusions left  greater than right. Stable cardiomegaly 07/15/13 Echo:  EF 40-45%  Treatment: Echo ordered IV Lasix 80mg  bid I&O q shift  Thank You, Estella Husk ,RN Clinical Documentation Specialist:  [facility] [employer]

## 2014-01-08 NOTE — Discharge Instructions (Signed)
Information on my medicine - XARELTO (rivaroxaban)  This medication education was reviewed with me or my healthcare representative as part of my discharge preparation.  The pharmacist that spoke with me during my hospital stay was:  Biagio Borg, [place]? Xarelto was prescribed to treat blood clots that may have been found in the veins of your legs (deep vein thrombosis) or in your lungs (pulmonary embolism) and to reduce the risk of them occurring again.  What do you need to know about Xarelto? The starting dose is one 15 mg tablet taken TWICE daily with food for the FIRST 21 DAYS then on 01-28-14 the dose is changed to one 20 mg tablet taken ONCE A DAY with your evening meal.  DO NOT stop taking Xarelto without talking to the health care provider who prescribed the medication.  Refill your prescription for 20 mg tablets before you run out.  After discharge, you should have regular check-up appointments with your healthcare provider that is prescribing your Xarelto.  In the future your dose may need to be changed if your kidney function changes by a significant amount.  What do you do if you miss a dose? If you are taking Xarelto TWICE DAILY and you miss a dose, take it as soon as you remember. You may take two 15 mg tablets (total 30 mg) at the same time then resume your regularly scheduled 15 mg twice daily the next day.  If you are taking Xarelto ONCE DAILY and you miss a dose, take it as soon as you remember on the same day then continue your regularly scheduled once daily regimen the next day. Do not take two doses of Xarelto at the same time.   Important Safety Information Xarelto is a blood thinner medicine that can cause bleeding. You should call your healthcare provider right away if you experience any of the following:   Bleeding from an injury or your nose that does not stop.   Unusual colored urine (red or dark brown) or  unusual colored stools (red or black).   Unusual bruising for unknown reasons.   A serious fall or if you hit your head (even if there is no bleeding).  Some medicines may interact with Xarelto and might increase your risk of bleeding while on Xarelto. To help avoid this, consult your healthcare provider or pharmacist prior to using any new prescription or non-prescription medications, including herbals, vitamins, non-steroidal anti-inflammatory drugs (NSAIDs) and supplements.  This website has more information on Xarelto: [email or web address]

## 2014-01-09 DIAGNOSIS — R894 Abnormal immunological findings in specimens from other organs, systems and tissues: Secondary | ICD-10-CM

## 2014-01-09 DIAGNOSIS — I10 Essential (primary) hypertension: Secondary | ICD-10-CM

## 2014-01-09 DIAGNOSIS — I739 Peripheral vascular disease, unspecified: Secondary | ICD-10-CM

## 2014-01-09 LAB — BASIC METABOLIC PANEL
BUN: 25 mg/dL — ABNORMAL HIGH (ref 6–23)
CHLORIDE: 99 meq/L (ref 96–112)
CO2: 30 meq/L (ref 19–32)
CREATININE: 1.08 mg/dL (ref 0.50–1.35)
Calcium: 8.9 mg/dL (ref 8.4–10.5)
GFR calc Af Amer: >90 mL/min (ref >90)
GFR calc non Af Amer: 78 mL/min — ABNORMAL LOW (ref >90)
Glucose, Bld: 155 mg/dL — ABNORMAL HIGH (ref 70–99)
POTASSIUM: 3.7 meq/L (ref 3.7–5.3)
Sodium: 141 mEq/L (ref 137–147)

## 2014-01-09 LAB — CBC WITH DIFFERENTIAL/PLATELET
BASOS ABS: 0 10*3/uL (ref 0.0–0.1)
BASOS PCT: 0 % (ref 0–1)
Eosinophils Absolute: 0.3 10*3/uL (ref 0.0–0.7)
Eosinophils Relative: 3 % (ref 0–5)
HEMATOCRIT: 36.6 % — AB (ref 39.0–52.0)
HEMOGLOBIN: 11.2 g/dL — AB (ref 13.0–17.0)
LYMPHS PCT: 14 % (ref 12–46)
Lymphs Abs: 1.3 10*3/uL (ref 0.7–4.0)
MCH: 21.6 pg — ABNORMAL LOW (ref 26.0–34.0)
MCHC: 30.6 g/dL (ref 30.0–36.0)
MCV: 70.5 fL — ABNORMAL LOW (ref 78.0–100.0)
MONO ABS: 1.1 10*3/uL — AB (ref 0.1–1.0)
Monocytes Relative: 12 % (ref 3–12)
NEUTROS ABS: 6.2 10*3/uL (ref 1.7–7.7)
NEUTROS PCT: 71 % (ref 43–77)
Platelets: 301 10*3/uL (ref 150–400)
RBC: 5.19 MIL/uL (ref 4.22–5.81)
RDW: 20.6 % — AB (ref 11.5–15.5)
WBC: 8.8 10*3/uL (ref 4.0–10.5)

## 2014-01-09 LAB — GLUCOSE, CAPILLARY
GLUCOSE-CAPILLARY: 119 mg/dL — AB (ref 70–99)
GLUCOSE-CAPILLARY: 150 mg/dL — AB (ref 70–99)
Glucose-Capillary: 140 mg/dL — ABNORMAL HIGH (ref 70–99)

## 2014-01-09 MED ORDER — RIVAROXABAN 20 MG PO TABS: 20.0000 mg | ORAL_TABLET | Freq: Every day | ORAL | Status: DC

## 2014-01-09 MED ORDER — RIVAROXABAN 15 MG PO TABS: 15.0000 mg | ORAL_TABLET | Freq: Two times a day (BID) | ORAL | Status: DC

## 2014-01-09 MED ORDER — ASPIRIN EC 81 MG PO TBEC: 81.0000 mg | DELAYED_RELEASE_TABLET | Freq: Every day | ORAL | Status: AC

## 2014-01-09 MED ORDER — OXYCODONE-ACETAMINOPHEN 5-325 MG PO TABS: 1.0000 | ORAL_TABLET | ORAL | Status: DC | PRN

## 2014-01-09 NOTE — Progress Notes (Signed)
Pt discharged home via wheelchair.  Pt given prescriptions x3 and instructions about follow up appointments and home health.  Pt verbalized understanding.  Pt IV removed and covered with 4x4 and clear tape.

## 2014-01-09 NOTE — Discharge Summary (Signed)
Physician Discharge Summary  Albert [name] [id number] DOB: [birth year] DOA: 01/06/2014  PCP: Minus Breeding, MD  Admit date: 01/06/2014 Discharge date: 01/09/2014  Time spent: 40 minutes  Recommendations for Outpatient Follow-up:  1. Followup with hematology on 4/21 2. Followup with cardiology as scheduled 3. Patient has been set up with home health RN. 4. Followup primary care physician in the next one to 2 weeks  Discharge Diagnoses:  Active Problems:   Cardiomyopathy, ischemic   Hx of BKA   Splenic infarct   Splenic infarction acute on chronic combined CHF Positive lupus anticoagulant  Discharge Condition: improved  Diet recommendation: low salt  Filed Weights   01/06/14 1948 01/08/14 0510 01/09/14 0900  Weight: 97.569 kg (215 lb 1.6 oz) 98.839 kg (217 lb 14.4 oz) 96 kg (211 lb 10.3 oz)    History of present illness:  53 year old male who has a past medical history of Hypertension; Diabetes mellitus without complication ([id number]); Hypercholesteremia; CVA (cerebral infarction) (2011); Chronic sinusitis; Allergic rhinitis; Chronic cough; Complication of anesthesia; and Stroke.  Patient presents to the ED with chief complaint of abdominal pain which started to his ago, located mainly in the left upper quadrant gets worse on deep breathing and coughing and hiccups. Not really require anything except pain medications. Which she received in the ED.  Patient has significant history of peripheral vascular disease, right BKA, stroke with residual hemiparesis of right upper extremity. Patient also has a history of recurrent sepsis/emesis erythremia TEE done in September 2014 was negative for vegetation. Patient also has a history of congestive heart failure and is currently maintained on Lasix. Patient is followed by cardiology as outpatient.  He denies recent trauma, no nausea or diarrhea. He has been complaining of some dysuria but denies fever. In the ED patient was found to have  hematuria with several 10 RBCs per high-power field in the UA and some hyaline casts. CBC reveals schistocytes, giant platelets on the peripheral smear.  He denies fever, no chest pain shortness of breath, no nausea vomiting or diarrhea.   Hospital Course:  This patient was admitted to the hospital with left-sided abdominal pain. Imaging indicated splenic infarcts. He was seen by hematology who orders CT angiogram of the abdomen. This did not indicate any significant stenoses in his abdominal vasculature. Echocardiogram was also ordered which did not show any vegetation/thrombosis. Patient was started on anticoagulation. Hypercoagulable panel was sent and was positive for lupus anticoagulant. It is recommended that patient stay on anticoagulation for the next 3-6 months. Since lupus anticoagulants is occasionally a transient finding, this test will need to be repeated after 6 months of therapy.  Patient was also noted to have significant lower tract edema, pleural effusions as well as elevated BNP. His oral Lasix was changed to IV and the patient had good diuresis. His volume status is -7 L. The patient is not short of breath. Associated edema has improved. We will restart his oral Lasix. Case was discussed with cardiology recommendations were to continue baby aspirin along with anticoagulation. He'll followup with his cardiologist.  Procedures: Echo: - Procedure narrative: Transthoracic echocardiography. Image quality was suboptimal. The study was technically difficult, as a result of poor sound wave transmission. - Left ventricle: The cavity size was normal. Systolic function was difficult to assess due to poor endocardial visualization, but appeared to bemildly to moderately reduced. The estimated ejection fraction was in the range of 40% to 45%. Features are consistent with a pseudonormal left ventricular filling pattern, with concomitant  abnormal relaxation and increased filling pressure  (grade 2 diastolic dysfunction). Doppler parameters are consistent with both elevated ventricular end-diastolic filling pressure and elevated left atrial filling pressure. Severe asymmetric basal septal hypertrophy (1.77 cm). - Mitral valve: Moderately thickened leaflets . No mitral stenosis noted. Moderate regurgitation. - Left atrium: The atrium was moderately dilated. - Right ventricle: The cavity size was moderately dilated. Systolic function was mildly to moderately reduced. - Right atrium: The atrium was moderately dilated. Central venous pressure: 87mm Hg (est). - Atrial septum: No defect or patent foramen ovale was identified. - Tricuspid valve: Mildly thickened leaflets. Moderate regurgitation. - Pulmonic valve: Mildly thickened leaflets. Trivial regurgitation. - Pulmonary arteries: PA peak pressure: 58mm Hg (S). Moderate to severely elevated pulmonary pressures. - Pericardium, extracardiac: A small pericardial effusion was present (measuring 1.2 cm in maximal dimension), with no evidence of tamponade physiology. There was a left pleural effusion.   Consultations:  Hematology  Discharge Exam: Filed Vitals:   01/09/14 1417  BP: 109/72  Pulse: 79  Temp: 98.8 F (37.1 C)  Resp: 20    General: NAD Cardiovascular: S1, S2 RRR Respiratory: diminished breath sounds at bases  Discharge Instructions  Discharge Orders   Future Appointments Provider Department Dept Phone   01/13/2014 8:45 AM Isaias Cowman, [place] [phone]   01/15/2014 10:15 AM Willow Ora, PTA [place] [phone]   01/20/2014 10:15 AM Isaias Cowman, [place] [phone]   01/22/2014 10:15 AM Willow Ora, PTA [place] [phone]   01/27/2014 10:15 AM Isaias Cowman, [place] [phone]   01/29/2014 10:15 AM Willow Ora, PTA [place] [phone]   02/04/2014 11:00 AM Burtis Junes, NP [place] [facility] [phone]   02/04/2014 11:30 AM [facility] Provider [place] [phone]   Future Orders Complete By Expires   (HEART FAILURE PATIENTS) Call MD:  Anytime you have any of the following symptoms: 1) 3 pound weight gain in 24 hours or 5 pounds in 1 week 2) shortness of breath, with or without a dry hacking cough 3) swelling in the hands, feet or stomach 4) if you have to sleep on extra pillows at night in order to breathe.  As directed    Diet - low sodium heart healthy  As directed    Face-to-face encounter (required for Medicare/Medicaid patients)  As directed    Comments:     I Naimah Yingst certify that this patient is under my care and that I, or a nurse practitioner or physician's assistant working with me, had a face-to-face encounter that meets the physician face-to-face encounter requirements with this patient on 01/09/2014. The encounter with the patient was in whole, or in part for the following medical condition(s) which is the primary reason for home health care (List medical condition): admitted with splenic infarcts and chf exacerbation.  Would benefit from home health RN   Questions:     The encounter with the patient was in whole, or in part, for the following medical condition, which is the primary reason for home health care:  chf exacerbation   I certify that, based on my findings, the following services are medically necessary home health services:  Nursing   My clinical findings support the need for the above services:  Unable to leave home safely without assistance and/or assistive device   Further, I certify that my clinical findings support that this  patient is homebound due to:  Unable to leave home safely without assistance    Reason for Medically Necessary Home Health Services:  Skilled Nursing- Teaching of Disease Process/Symptom Management   Home Health  As directed    Questions:     To provide the following care/treatments:  RN   Increase activity slowly  As directed        Medication List         ALPRAZolam 0.25 MG tablet  Commonly known as:  XANAX  Take 1 tablet (0.25 mg total) by mouth 2 (two) times daily as needed for anxiety.     aspirin EC 81 MG tablet  Take 1 tablet (81 mg total) by mouth daily.     carvedilol 25 MG tablet  Commonly known as:  COREG  Take 1.5 tablets (37.5 mg total) by mouth 2 (two) times daily with a meal.     CLARITIN-D 12 HOUR PO  Take 1 tablet by mouth as needed (allergies).     ferrous sulfate 325 (65 FE) MG EC tablet  Take 325 mg by mouth daily with breakfast.     furosemide 80 MG tablet  Commonly known as:  LASIX  Take 1 tablet (80 mg total) by mouth 2 (two) times daily.     hydrALAZINE 10 MG tablet  Commonly known as:  APRESOLINE  Take 1 tablet (10 mg total) by mouth 3 (three) times daily.     insulin aspart 100 UNIT/ML injection  Commonly known as:  novoLOG  Inject 5 Units into the skin 3 (three) times daily before meals.     insulin detemir 100 UNIT/ML injection  Commonly known as:  LEVEMIR  Inject 0.23 mLs (23 Units total) into the skin at bedtime.     losartan 25 MG tablet  Commonly known as:  COZAAR  Take 1 tablet (25 mg total) by mouth daily.     oxyCODONE-acetaminophen 5-325 MG per tablet  Commonly known as:  ROXICET  Take 1-2 tablets by mouth every 4 (four) hours as needed for severe pain.     pantoprazole 40 MG tablet  Commonly known as:  PROTONIX  Take 1 tablet (40 mg total) by mouth daily.     polyethylene glycol packet  Commonly known as:  MIRALAX / GLYCOLAX  Take 17 g by mouth daily as needed for mild constipation.     potassium chloride 10 MEQ tablet  Commonly known as:  K-DUR  Take 1 tablet (10 mEq total) by mouth 2 (two) times  daily.     promethazine 25 MG tablet  Commonly known as:  PHENERGAN  Take 1 tab every 6-8 hours for nausea.     Rivaroxaban 15 MG Tabs tablet  Commonly known as:  XARELTO  Take 1 tablet (15 mg total) by mouth 2 (two) times daily with a meal. For 21 days     Rivaroxaban 20 MG Tabs tablet  Commonly known as:  XARELTO  Take 1 tablet (20 mg total) by mouth daily with supper. To begin once 15mg  tablets are complete     senna-docusate 8.6-50 MG per tablet  Commonly known as:  Senokot-S  Take 1 tablet by mouth as needed.     simvastatin 40 MG tablet  Commonly known as:  ZOCOR  Take 1 tablet (40 mg total) by mouth every evening.     tamsulosin 0.4 MG Caps capsule  Commonly known as:  FLOMAX  Take 1 capsule (0.4 mg total) by mouth daily after  supper.       Allergies  Allergen Reactions  . Amoxicillin Nausea And Vomiting       Follow-up Information   Follow up with [facility]. (CHF program / [facility] RN, will call to set up appt)    Contact information:   [address] [place] [address] [phone]      Follow up with [facility] On 02/04/2014. (MD appointment with Dr. Zigmund Daniel.  Appt time is 11:30 AM, please arrive at 11 AM.)    Contact information:   [address] [place] [address]       Follow up with Peter [place], MD. (in 2 weeks)    Specialty:  Cardiology   Contact information:   [address] [place] [address] [phone]        The results of significant diagnostics from this hospitalization (including imaging, microbiology, ancillary and laboratory) are listed below for reference.    Significant Diagnostic Studies: Ct Abdomen Pelvis Wo Contrast  01/06/2014   CLINICAL DATA:  Left flank pain.  EXAM: CT ABDOMEN AND PELVIS WITHOUT CONTRAST  TECHNIQUE: Multidetector CT imaging of the abdomen and pelvis was performed following the standard protocol without intravenous contrast.  COMPARISON:  None.  FINDINGS:  There are moderate to large bilateral pleural effusions. Compressive atelectasis in the lower lobes bilaterally. Small pericardial effusion. Heart is mildly enlarged.  Area of low density noted anteriorly and and inferiorly within the spleen. There is adjacent surrounding stranding near the tip of the spleen and extending into the left anterior para renal space. Small amount of perihepatic ascites adjacent to the liver tip. This is nonspecific. Contrast CT would be beneficial for further evaluation. Differential considerations would include infection, infarcts or trauma.  Liver, gallbladder, pancreas, adrenals and kidneys have an unremarkable unenhanced appearance. No renal or ureteral stones. No hydronephrosis.  Aorta and iliac vessels are calcified, non aneurysmal. Penile prosthesis noted. Trace free fluid in the cul-de-sac of the pelvis.  Stomach, large and small bowel grossly unremarkable.  No acute bony abnormality.  IMPRESSION: Large bilateral pleural effusions. Compressive atelectasis in the lower lobes. Small pericardial effusion.  Multiple areas of abnormal low-density within the spleen. Adjacent stranding. Differential considerations would include splenic infarcts, infection, or trauma. This area would be better evaluated and visualized with contrast CT.  Small amount of free fluid adjacent to the liver and in the cul-de-sac of the pelvis.  These results were called by telephone at the time of interpretation on 01/06/2014 at 3:02 PM to Dr. Raeford Razor , who verbally acknowledged these results.   Electronically Signed   By: Charlett Nose M.D.   On: 01/06/2014 15:11   Ct Angio Abdomen W/cm &/or Wo Contrast  01/08/2014   CLINICAL DATA:  Left upper abdominal pain, acute splenic infarctions  EXAM: CT ANGIOGRAPHY ABDOMEN  TECHNIQUE: Multidetector CT imaging of the abdomen and pelvis using the standard protocol during bolus administration of intravenous contrast. Multiplanar reconstructed images obtained and  reviewed to evaluate the vascular anatomy.  CONTRAST:  OMNIPAQUE IOHEXOL 350 MG/ML SOLN  COMPARISON:  01/06/2014  FINDINGS: Large dependent pleural effusions noted with bibasilar and lingula compressive atelectasis. The pleural effusions are slightly larger compared to the prior study. Small dependent pericardial effusion noted. No hiatal hernia.  Abdomen: Abdominal aorta demonstrates calcific atherosclerosis of the infrarenal segment and bifurcation without aneurysm, dissection or occlusion. No retroperitoneal hemorrhage. Celiac is widely patent. Celiac branches including the hepatic, left  gastric, splenic arteries are all patent. SMA origin is widely patent. Main SMA trunk is patent in the central mesentery. Main renal arteries are well visualized and patent bilaterally. IMA is patent off of the inferior aorta.  Negative for significant mesenteric or renal vascular occlusive process. No identifiable embolic source to account for the evolving subacute splenic infarcts.  Nonvascular findings: Small amount of abdominal free fluid/ascites along the pericolic gutters bilaterally.  Liver, collapsed gallbladder, biliary system, pancreas, adrenal glands, and kidneys are within normal limits for age and demonstrate no acute process.  Similar wedge-shaped hypodense areas in the spleen compatible with evolving splenic infarcts.  No subcapsular acute hemorrhage or hematoma.  Negative for bowel obstruction, dilatation, ileus, or free air.  No abdominal fluid collection, abscess, or large hematoma.  Minor degenerative changes of the spine diffusely.  IMPRESSION: Patent mesenteric vasculature without an identifiable embolic source to account for the subacute splenic infarcts.  Large pleural effusions with associated compressive bibasilar and lingular atelectasis. Trace pericardial effusion.  Small amount of intra-abdominal free fluid/ascites along the pericolic gutters as before, unchanged   Electronically Signed   By:  Daryll Brod M.D.   On: 01/08/2014 16:20   Ct Abdomen Pelvis W Contrast  01/06/2014   CLINICAL DATA:  Left upper quadrant pain. Abnormal spleen on noncontrast study.  EXAM: CT ABDOMEN AND PELVIS WITH CONTRAST  TECHNIQUE: Multidetector CT imaging of the abdomen and pelvis was performed using the standard protocol following bolus administration of intravenous contrast.  CONTRAST:  152mL OMNIPAQUE IOHEXOL 300 MG/ML  SOLN  COMPARISON:  Noncontrast study earlier today.  FINDINGS: Large bilateral pleural effusions noted. There is compressive atelectasis in the lower lobes. Small pericardial effusion.  Abnormal areas of decreased enhancement noted within the spleen, both the medial superior pole, anterior pole and inferior pole. I suspect these represent areas of splenic infarction. Superior pole area is wedge-shaped on coronal imaging. There is surrounding stranding adjacent to the inferior pole of the spleen.  Small amount of perihepatic ascites and pelvic ascites. Liver, gallbladder, pancreas, adrenals and kidneys are unremarkable.  Stomach, large and small bowel are unremarkable. No free air or adenopathy. Urinary bladder is unremarkable. Penile prosthesis noted.  No acute bony abnormality.  Atherosclerotic calcifications throughout the infrarenal aorta and iliofemoral vessels.  IMPRESSION: Multiple areas of decreased enhancement throughout the spleen. These are most compatible with splenic infarcts. Exact source is not visualized. No other end organ infarcts noted in the abdomen.  Trace free fluid adjacent to the liver and in the pelvis.  Large bilateral pleural effusions with compressive atelectasis in the lower lobes. Small pericardial effusion.   Electronically Signed   By: Rolm Baptise M.D.   On: 01/06/2014 16:20   Dg Chest Port 1 View  01/07/2014   CLINICAL DATA:  pleural effusion  EXAM: PORTABLE CHEST - 1 VIEW  COMPARISON:  DG CHEST 2 VIEW dated 08/02/2013  FINDINGS: Low lung volumes. Cardiac silhouette  is enlarged. There is blunting of the costophrenic angles. Areas of increased density projects within the lung bases. There is prominence of the interstitial markings and areas of peribronchial cuffing. Osseous structures unremarkable.  IMPRESSION: Interstitial infiltrates likely reflecting pulmonary edema.  Blunted costophrenic angles consistent with small effusions left greater than right  Stable cardiomegaly   Electronically Signed   By: Margaree Mackintosh M.D.   On: 01/07/2014 17:28    Microbiology: No results found for this or any previous visit (from the past 240 hour(s)).   Labs: Basic  Metabolic Panel:  Recent Labs Lab 01/03/14 1339 01/06/14 1300 01/07/14 0505 01/08/14 0529 01/09/14 0526  NA 136 136* 137 141 141  K 3.4* 4.1 4.3 4.0 3.7  CL 101 96 99 101 99  CO2 24 29 25 26 30   GLUCOSE 148* 198* 164* 113* 155*  BUN 19 20 21  25* 25*  CREATININE 1.0 1.00 0.90 1.04 1.08  CALCIUM 9.0 9.0 8.7 8.7 8.9   Liver Function Tests:  Recent Labs Lab 01/06/14 1300 01/07/14 0505  AST 11 10  ALT 9 10  ALKPHOS 69 67  BILITOT 0.9 0.6  PROT 7.1 6.2  ALBUMIN 3.3* 2.9*    Recent Labs Lab 01/06/14 1300  LIPASE 14   No results found for this basename: AMMONIA,  in the last 168 hours CBC:  Recent Labs Lab 01/03/14 1339 01/06/14 1300 01/07/14 0505 01/08/14 0529 01/09/14 0526  WBC 7.9 11.0* 9.2 9.1 8.8  NEUTROABS  --  8.9*  --  6.5 6.2  HGB 10.6* 10.6* 9.9* 10.8* 11.2*  HCT 34.4* 34.3* 32.0* 34.7* 36.6*  MCV 69.9* 70.1* 70.5* 71.0* 70.5*  PLT 280.0 196 178 246 301   Cardiac Enzymes: No results found for this basename: CKTOTAL, CKMB, CKMBINDEX, TROPONINI,  in the last 168 hours BNP: BNP (last 3 results)  Recent Labs  07/26/13 10/01/13 1157 01/07/14 1730  PROBNP 30099.0* 1302.0* 5530.0*   CBG:  Recent Labs Lab 01/08/14 1652 01/08/14 2039 01/09/14 0732 01/09/14 1147 01/09/14 1616  GLUCAP 94 167* 119* 150* 140*       Signed:  Marcel Sorter  [facility] 01/09/2014, 7:47 PM

## 2014-01-09 NOTE — Progress Notes (Signed)
Subjective: Patient is seen in bed.  He reports 1 episode of pain this AM before 9.  He was given morphine with excellent response.  He has not had any pain since the time of this dictation.    I personally reviewed and went over radiographic studies with the patient.  The results are noted within this dictation.  CT angio of abdomen was unimpressive.  This was performed to pay particular attention to the superior mesenteric artery and splenic artery to see if there was an explanation for his splenic infarcts.  2D echo was also performed and there was not vegetations noted.   I personally reviewed and went over radiographic studies with the patient.  The results are noted within this dictation.  His hypercoag panel was positive for lupus anticoagulant.  Sometimes these are transient and therefore, he should be tested in the future again.   He is planning for discharge from the hospital today.    I spent time reviewing Xarelto therapy.  He understands the dosing of this medication. We discussed the risks of this medication including bleeding and death.  We discussed the pros and cons of this medication.  He is advised to wear a seatbelt while in an automobile, he is educated to follow safety instruction when using power tools, he is to follow safe gun handling, etc.   Objective: Vital signs in last 24 hours: Temp:  [97.6 F (36.4 C)-98.1 F (36.7 C)] 98.1 F (36.7 C) (03/26 0417) Pulse Rate:  [76-84] 83 (03/26 0900) Resp:  [20] 20 (03/26 0417) BP: (112-157)/(78-87) 112/78 mmHg (03/26 0900) SpO2:  [90 %-94 %] 94 % (03/26 0417) Weight:  [211 lb 10.3 oz (96 kg)] 211 lb 10.3 oz (96 kg) (03/26 0900)  Intake/Output from previous day: 03/25 0800 - 03/26 0759 In: 483 [P.O.:480] Out: 3850 [Urine:3850] Intake/Output this shift: Total I/O In: 240 [P.O.:240] Out: 650 [Urine:650]  General appearance: alert, cooperative, appears older than stated age and chronically ill appearing  Lab Results:    Recent Labs  01/08/14 0529 01/09/14 0526  WBC 9.1 8.8  HGB 10.8* 11.2*  HCT 34.7* 36.6*  PLT 246 301   BMET  Recent Labs  01/08/14 0529 01/09/14 0526  NA 141 141  K 4.0 3.7  CL 101 99  CO2 26 30  GLUCOSE 113* 155*  BUN 25* 25*  CREATININE 1.04 1.08  CALCIUM 8.7 8.9    Studies/Results: Ct Angio Abdomen W/cm &/or Wo Contrast  01/08/2014   CLINICAL DATA:  Left upper abdominal pain, acute splenic infarctions  EXAM: CT ANGIOGRAPHY ABDOMEN  TECHNIQUE: Multidetector CT imaging of the abdomen and pelvis using the standard protocol during bolus administration of intravenous contrast. Multiplanar reconstructed images obtained and reviewed to evaluate the vascular anatomy.  CONTRAST:  1110mL OMNIPAQUE IOHEXOL 350 MG/ML SOLN  COMPARISON:  01/06/2014  FINDINGS: Large dependent pleural effusions noted with bibasilar and lingula compressive atelectasis. The pleural effusions are slightly larger compared to the prior study. Small dependent pericardial effusion noted. No hiatal hernia.  Abdomen: Abdominal aorta demonstrates calcific atherosclerosis of the infrarenal segment and bifurcation without aneurysm, dissection or occlusion. No retroperitoneal hemorrhage. Celiac is widely patent. Celiac branches including the hepatic, left gastric, splenic arteries are all patent. SMA origin is widely patent. Main SMA trunk is patent in the central mesentery. Main renal arteries are well visualized and patent bilaterally. IMA is patent off of the inferior aorta.  Negative for significant mesenteric or renal vascular occlusive process. No identifiable embolic source  to account for the evolving subacute splenic infarcts.  Nonvascular findings: Small amount of abdominal free fluid/ascites along the pericolic gutters bilaterally.  Liver, collapsed gallbladder, biliary system, pancreas, adrenal glands, and kidneys are within normal limits for age and demonstrate no acute process.  Similar wedge-shaped hypodense areas  in the spleen compatible with evolving splenic infarcts.  No subcapsular acute hemorrhage or hematoma.  Negative for bowel obstruction, dilatation, ileus, or free air.  No abdominal fluid collection, abscess, or large hematoma.  Minor degenerative changes of the spine diffusely.  IMPRESSION: Patent mesenteric vasculature without an identifiable embolic source to account for the subacute splenic infarcts.  Large pleural effusions with associated compressive bibasilar and lingular atelectasis. Trace pericardial effusion.  Small amount of intra-abdominal free fluid/ascites along the pericolic gutters as before, unchanged   Electronically Signed   By: Daryll Brod M.D.   On: 01/08/2014 16:20   Dg Chest Port 1 View  01/07/2014   CLINICAL DATA:  pleural effusion  EXAM: PORTABLE CHEST - 1 VIEW  COMPARISON:  DG CHEST 2 VIEW dated 08/02/2013  FINDINGS: Low lung volumes. Cardiac silhouette is enlarged. There is blunting of the costophrenic angles. Areas of increased density projects within the lung bases. There is prominence of the interstitial markings and areas of peribronchial cuffing. Osseous structures unremarkable.  IMPRESSION: Interstitial infiltrates likely reflecting pulmonary edema.  Blunted costophrenic angles consistent with small effusions left greater than right  Stable cardiomegaly   Electronically Signed   By: Margaree Mackintosh M.D.   On: 01/07/2014 17:28    Medications: I have reviewed the patient's current medications.  Assessment/Plan: 1. Splenic infarct in the setting of Lupus Anticoagulant positivity.  He is transitioned to Xarelto 15 mg BID x 21 days and then 20 mg daily.  2. Lupus anticoagulant positive 3. Microcytic anemia, suspect element of iron deficiency with falsely normal ferritin.  IV Feraheme 510 mg administered on 01/07/2014 4. PVD 5. CAD 7. Right below the knee amputation 8. History of stroke affecting right side of body with residual weakness of right arm and hand 9.  Uncontrolled DM 10. Recommendations:  Anticoagulation with Factor Xa-inhibitor for 6 months.  Near the completion of therapy, repeat lupus anticoagulant to confirm continuity of this abnormality (as it can be transient) and imaging study to confirm splenic infarct resolution.  He will need outpatient pain control for this infarct until resolution/improvement. 11. Follow-up at the [facility] on 02/04/2014 at 11:30 AM.  Please arrive at 11 AM.     Patient and plan discussed with Dr. Farrel Gobble and he is in agreement with the aforementioned.     LOS: 3 days    Crissie Aloi 01/09/2014

## 2014-01-09 NOTE — Progress Notes (Signed)
Pt appears to be resting comfortably, vss no c/o pain, low bed, hob self regulated, call bell at pt's side, will cont to monitor

## 2014-01-09 NOTE — Progress Notes (Signed)
Pt appears to be resting comfortably, vss, no c/o pain, low bed, hob self regulated, call bell at pt's side, will cont to montior

## 2014-01-10 LAB — ERYTHROPOIETIN: Erythropoietin: 40 m[IU]/mL — ABNORMAL HIGH (ref 2.6–18.5)

## 2014-01-13 ENCOUNTER — Ambulatory Visit: Payer: 59 | Admitting: Physical Therapy

## 2014-01-15 ENCOUNTER — Ambulatory Visit: Payer: 59 | Attending: Orthopedic Surgery | Admitting: Physical Therapy

## 2014-01-15 DIAGNOSIS — Z4789 Encounter for other orthopedic aftercare: Secondary | ICD-10-CM | POA: Insufficient documentation

## 2014-01-15 DIAGNOSIS — R269 Unspecified abnormalities of gait and mobility: Secondary | ICD-10-CM | POA: Insufficient documentation

## 2014-01-15 DIAGNOSIS — M6281 Muscle weakness (generalized): Secondary | ICD-10-CM | POA: Insufficient documentation

## 2014-01-20 ENCOUNTER — Ambulatory Visit: Payer: 59 | Admitting: Physical Therapy

## 2014-01-22 ENCOUNTER — Ambulatory Visit: Payer: 59 | Admitting: Physical Therapy

## 2014-01-27 ENCOUNTER — Ambulatory Visit: Payer: 59 | Admitting: Physical Therapy

## 2014-01-29 ENCOUNTER — Ambulatory Visit: Payer: 59 | Admitting: Physical Therapy

## 2014-02-03 ENCOUNTER — Ambulatory Visit: Payer: 59 | Admitting: Physical Therapy

## 2014-02-04 ENCOUNTER — Encounter (HOSPITAL_COMMUNITY): Payer: 59 | Attending: Hematology and Oncology

## 2014-02-04 ENCOUNTER — Ambulatory Visit: Payer: 59 | Admitting: Nurse Practitioner

## 2014-02-04 ENCOUNTER — Encounter (HOSPITAL_COMMUNITY): Payer: Self-pay

## 2014-02-04 VITALS — BP 109/59 | HR 73 | Temp 97.6°F | Resp 18 | Ht 71.0 in | Wt 212.2 lb

## 2014-02-04 DIAGNOSIS — I2589 Other forms of chronic ischemic heart disease: Secondary | ICD-10-CM

## 2014-02-04 DIAGNOSIS — D649 Anemia, unspecified: Secondary | ICD-10-CM

## 2014-02-04 DIAGNOSIS — D7389 Other diseases of spleen: Secondary | ICD-10-CM

## 2014-02-04 DIAGNOSIS — R894 Abnormal immunological findings in specimens from other organs, systems and tissues: Secondary | ICD-10-CM

## 2014-02-04 NOTE — Patient Instructions (Signed)
[facility] Discharge Instructions  RECOMMENDATIONS MADE BY THE CONSULTANT AND ANY TEST RESULTS WILL BE SENT TO YOUR REFERRING PHYSICIAN. Return in 3 months for a doctor visit and lab work. Follow up with your Cardiologist as scheduled.   Thank you for choosing [facility] to provide your oncology and hematology care.  To afford each patient quality time with our providers, please arrive at least 15 minutes before your scheduled appointment time.  With your help, our goal is to use those 15 minutes to complete the necessary work-up to ensure our physicians have the information they need to help with your evaluation and healthcare recommendations.    Effective January 1st, 2014, we ask that you re-schedule your appointment with our physicians should you arrive 10 or more minutes late for your appointment.  We strive to give you quality time with our providers, and arriving late affects you and other patients whose appointments are after yours.    Again, thank you for choosing [facility].  Our hope is that these requests will decrease the amount of time that you wait before being seen by our physicians.       _____________________________________________________________  Should you have questions after your visit to [facility], please contact our office at [phone] between the hours of 8:30 a.m. and 5:00 p.m.  Voicemails left after 4:30 p.m. will not be returned until the following business day.  For prescription refill requests, have your pharmacy contact our office with your prescription refill request.

## 2014-02-04 NOTE — Progress Notes (Signed)
[facility]  OFFICE PROGRESS NOTE  Minus Breeding, MD [phone] [address] [place] [address]  DIAGNOSIS: No diagnosis found.  Chief Complaint  Patient presents with  . Splenic infarction    CURRENT THERAPY: Xarelto on the right. He has had minimal nasal drip without sore throat or earache. He denies any persistent epistaxis or any hematuria, melena, or hematochezia. Appetite is good with no nausea, vomiting, diarrhea, constipation. He is making large volumes of urine taking 80 mg of Lasix twice a day. He has an appointment with his cardiologist tomorrow. mg daily.   INTERVAL HISTORY: Albert [name] 53 y.o. male returns for followup after recent hospitalization at which time splenic infarction was diagnosed. Patient was anticoagulated and currently takes Xarelto. He was also found to have large pleural effusions with ischemic cardiomyopathy, being followed by cardiology. He did manifest a positive lupus anticoagulant. Previous left upper quadrant pain has dissipated completely. He does feel less short of breath. He still has right upper extremity weakness and wears a prosthesis on the right lower extremity below the knee.Marland Kitchen He has had minimal nasal drip without sore throat or earache. He denies any persistent epistaxis or any hematuria, melena, or hematochezia. Appetite is good with no nausea, vomiting, diarrhea, constipation. He is making large volumes of urine taking 80 mg of Lasix twice a day. He has an appointment with his cardiologist tomorrow.     MEDICAL HISTORY: Past Medical History  Diagnosis Date  . Hypertension   . Diabetes mellitus without complication ~3474    last HbA1c ~9  . Hypercholesteremia   . CVA (cerebral infarction) 2011    R hand deficit  . Chronic sinusitis   . Allergic rhinitis   . Chronic cough   . Complication of anesthesia     couldn't swallow and talk  . Stroke     INTERIM HISTORY: has  Hypercholesteremia; Gangrene; Diabetes mellitus out of control; Essential hypertension, benign; Atherosclerotic peripheral vascular disease; Peripheral vascular disease, unspecified; Aftercare following surgery of the circulatory system, NEC; AKI (acute kidney injury); Foot abscess, right; Shock circulatory; Dehydration with hyponatremia; Leukocytosis, unspecified; Sepsis(995.91); NSTEMI (non-ST elevated myocardial infarction); Coronary atherosclerosis of native coronary artery; Cardiomyopathy, ischemic; Hx of BKA; Acute pulmonary edema; Pneumonia, organism unspecified; Hypoxemia; Acute systolic heart failure; Acute kidney failure; Hypotension, unspecified; Anemia; Hyperkalemia; Hyposmolality and/or hyponatremia; Acute respiratory failure; Mitral regurgitation; Oliguria; Bacteremia due to Staphylococcus aureus; Hyperchloremia; Hx of right BKA; Acute systolic CHF (congestive heart failure); Splenic infarct; and Splenic infarction on his problem list.    ALLERGIES:  is allergic to amoxicillin.  MEDICATIONS: has a current medication list which includes the following prescription(s): alprazolam, aspirin ec, carvedilol, ferrous sulfate, furosemide, hydralazine, insulin aspart, insulin detemir, loratadine-pseudoephedrine, losartan, oxycodone-acetaminophen, pantoprazole, polyethylene glycol, potassium chloride, promethazine, rivaroxaban, rivaroxaban, senna-docusate, simvastatin, and tamsulosin.  SURGICAL HISTORY:  Past Surgical History  Procedure Laterality Date  . 4th toe amputation Left Jan. 2014    4th toe in [place]  . Tonsillectomy and adenoidectomy    . Penile prosthesis implant    . I&d extremity Left 12/08/2012    Procedure: IRRIGATION AND DEBRIDEMENT EXTREMITY;  Surgeon: Mcarthur Rossetti, MD;  Location: [facility];  Service: Orthopedics;  Laterality: Left;  . Amputation Left 12/08/2012    Procedure: AMPUTATION DIGIT;  Surgeon: Mcarthur Rossetti, MD;  Location: [facility];  Service: Orthopedics;   Laterality: Left;  3rd toe amputation possible 5th toe amputation  . I&d extremity  Left 12/11/2012    Procedure: REPEAT I&D LEFT FOOT;  Surgeon: Kathryne Hitch, MD;  Location: [facility] OR;  Service: Orthopedics;  Laterality: Left;  . Amputation Left 12/11/2012    Procedure: Amputation of 2nd Toe;  Surgeon: Kathryne Hitch, MD;  Location: [facility] OR;  Service: Orthopedics;  Laterality: Left;  . Bypass graft popliteal to tibial Left 12/13/2012    Procedure: BYPASS GRAFT POPLITEAL TO TIBIAL;  Surgeon: Nada Libman, MD;  Location: [facility] OR;  Service: Vascular;  Laterality: Left;  Left Popliteal to Posterior Tibial Bypass Graft with reversed saphenous vein graft  . Incision and drainage Right 06/25/2013    Procedure: INCISION AND DRAINAGE RIGHT FOOT;  Surgeon: Kathryne Hitch, MD;  Location: [facility] ORS;  Service: Orthopedics;  Laterality: Right;  . Amputation Right 06/29/2013    Procedure: AMPUTATION BELOW KNEE;  Surgeon: Nadara Mustard, MD;  Location: [facility] OR;  Service: Orthopedics;  Laterality: Right;  . Tee without cardioversion [name] 07/03/2013    Procedure: TRANSESOPHAGEAL ECHOCARDIOGRAM (TEE);  Surgeon: Laurey Morale, MD;  Location: [facility] ENDOSCOPY;  Service: Cardiovascular;  Laterality: [name];  . Stump revision Right 08/16/2013    Procedure: STUMP REVISION- right Below Knee Amputation;  Surgeon: Nadara Mustard, MD;  Location: [facility] OR;  Service: Orthopedics;  Laterality: Right;    FAMILY HISTORY: family history includes Breast cancer in his sister; Depression in his maternal grandfather, maternal grandmother, paternal grandfather, and paternal grandmother; Diabetes in his mother and another family member; Heart disease in his maternal grandfather, maternal grandmother, paternal grandfather, and paternal grandmother; Hyperlipidemia in his mother; Pancreatic cancer in his mother.  SOCIAL HISTORY:  reports that he has never smoked. He has never used smokeless tobacco. He reports that he does not drink alcohol  or use illicit drugs.  REVIEW OF SYSTEMS:  Other than that discussed above is noncontributory.  PHYSICAL EXAMINATION: ECOG PERFORMANCE STATUS: 1 - Symptomatic but completely ambulatory  There were no vitals taken for this visit.  GENERAL:alert, no distress and comfortable SKIN: skin color, texture, turgor are normal, no rashes or significant lesions EYES: PERLA; Conjunctiva are pink and non-injected, sclera clear SINUSES: No redness or tenderness over maxillary or ethmoid sinuses OROPHARYNX:no exudate, no erythema on lips, buccal mucosa, or tongue. NECK: supple, thyroid normal size, non-tender, without nodularity. No masses CHEST: Bilateral gynecomastia with no spider angiomata. LYMPH:  no palpable lymphadenopathy in the cervical, axillary or inguinal LUNGS: Decreased breath sounds with dullness to percussion bilaterally. HEART: regular rate & rhythm and no murmurs. No S3. ABDOMEN:abdomen soft, non-tender and normal bowel sounds. No free fluid wave or shifting dullness. No left upper quadrant tenderness. MUSCULOSKELETAL:no cyanosis of digits and no clubbing. Range of motion normal. Right below the knee amputation with prosthesis in place. No evidence of purulence or bleeding at the stump. NEURO: Alert and oriented x3. Slightly slurred speech. Right upper extremity weakness with deformity.   LABORATORY DATA: Admission on 01/06/2014, Discharged on 01/09/2014  No results displayed because visit has over 200 results.      PATHOLOGY: Lupus anticoagulant was positive at the time of diagnosis of splenic infarct I  Urinalysis    Component Value Date/Time   COLORURINE AMBER* 01/06/2014 1455   APPEARANCEUR CLEAR 01/06/2014 1455   LABSPEC >1.030* 01/06/2014 1455   PHURINE 5.5 01/06/2014 1455   GLUCOSEU NEGATIVE 01/06/2014 1455   HGBUR TRACE* 01/06/2014 1455   BILIRUBINUR NEGATIVE 01/06/2014 1455   KETONESUR NEGATIVE 01/06/2014 1455   PROTEINUR 100* 01/06/2014 1455  UROBILINOGEN 0.2 01/06/2014  1455   NITRITE NEGATIVE 01/06/2014 1455   LEUKOCYTESUR NEGATIVE 01/06/2014 1455    RADIOGRAPHIC STUDIES: Ct Abdomen Pelvis Wo Contrast  01/06/2014   CLINICAL DATA:  Left flank pain.  EXAM: CT ABDOMEN AND PELVIS WITHOUT CONTRAST  TECHNIQUE: Multidetector CT imaging of the abdomen and pelvis was performed following the standard protocol without intravenous contrast.  COMPARISON:  None.  FINDINGS: There are moderate to large bilateral pleural effusions. Compressive atelectasis in the lower lobes bilaterally. Small pericardial effusion. Heart is mildly enlarged.  Area of low density noted anteriorly and and inferiorly within the spleen. There is adjacent surrounding stranding near the tip of the spleen and extending into the left anterior para renal space. Small amount of perihepatic ascites adjacent to the liver tip. This is nonspecific. Contrast CT would be beneficial for further evaluation. Differential considerations would include infection, infarcts or trauma.  Liver, gallbladder, pancreas, adrenals and kidneys have an unremarkable unenhanced appearance. No renal or ureteral stones. No hydronephrosis.  Aorta and iliac vessels are calcified, non aneurysmal. Penile prosthesis noted. Trace free fluid in the cul-de-sac of the pelvis.  Stomach, large and small bowel grossly unremarkable.  No acute bony abnormality.  IMPRESSION: Large bilateral pleural effusions. Compressive atelectasis in the lower lobes. Small pericardial effusion.  Multiple areas of abnormal low-density within the spleen. Adjacent stranding. Differential considerations would include splenic infarcts, infection, or trauma. This area would be better evaluated and visualized with contrast CT.  Small amount of free fluid adjacent to the liver and in the cul-de-sac of the pelvis.  These results were called by telephone at the time of interpretation on 01/06/2014 at 3:02 PM to Dr. [name] , who verbally acknowledged these results.   Electronically  Signed   By: Rolm Baptise M.D.   On: 01/06/2014 15:11   Ct Angio Abdomen W/cm &/or Wo Contrast  01/08/2014   CLINICAL DATA:  Left upper abdominal pain, acute splenic infarctions  EXAM: CT ANGIOGRAPHY ABDOMEN  TECHNIQUE: Multidetector CT imaging of the abdomen and pelvis using the standard protocol during bolus administration of intravenous contrast. Multiplanar reconstructed images obtained and reviewed to evaluate the vascular anatomy.  CONTRAST:  139mL OMNIPAQUE IOHEXOL 350 MG/ML SOLN  COMPARISON:  01/06/2014  FINDINGS: Large dependent pleural effusions noted with bibasilar and lingula compressive atelectasis. The pleural effusions are slightly larger compared to the prior study. Small dependent pericardial effusion noted. No hiatal hernia.  Abdomen: Abdominal aorta demonstrates calcific atherosclerosis of the infrarenal segment and bifurcation without aneurysm, dissection or occlusion. No retroperitoneal hemorrhage. Celiac is widely patent. Celiac branches including the hepatic, left gastric, splenic arteries are all patent. SMA origin is widely patent. Main SMA trunk is patent in the central mesentery. Main renal arteries are well visualized and patent bilaterally. IMA is patent off of the inferior aorta.  Negative for significant mesenteric or renal vascular occlusive process. No identifiable embolic source to account for the evolving subacute splenic infarcts.  Nonvascular findings: Small amount of abdominal free fluid/ascites along the pericolic gutters bilaterally.  Liver, collapsed gallbladder, biliary system, pancreas, adrenal glands, and kidneys are within normal limits for age and demonstrate no acute process.  Similar wedge-shaped hypodense areas in the spleen compatible with evolving splenic infarcts.  No subcapsular acute hemorrhage or hematoma.  Negative for bowel obstruction, dilatation, ileus, or free air.  No abdominal fluid collection, abscess, or large hematoma.  Minor degenerative changes of  the spine diffusely.  IMPRESSION: Patent mesenteric vasculature without an identifiable  embolic source to account for the subacute splenic infarcts.  Large pleural effusions with associated compressive bibasilar and lingular atelectasis. Trace pericardial effusion.  Small amount of intra-abdominal free fluid/ascites along the pericolic gutters as before, unchanged   Electronically Signed   By: Daryll Brod M.D.   On: 01/08/2014 16:20   Ct Abdomen Pelvis W Contrast  01/06/2014   CLINICAL DATA:  Left upper quadrant pain. Abnormal spleen on noncontrast study.  EXAM: CT ABDOMEN AND PELVIS WITH CONTRAST  TECHNIQUE: Multidetector CT imaging of the abdomen and pelvis was performed using the standard protocol following bolus administration of intravenous contrast.  CONTRAST:  130mL OMNIPAQUE IOHEXOL 300 MG/ML  SOLN  COMPARISON:  Noncontrast study earlier today.  FINDINGS: Large bilateral pleural effusions noted. There is compressive atelectasis in the lower lobes. Small pericardial effusion.  Abnormal areas of decreased enhancement noted within the spleen, both the medial superior pole, anterior pole and inferior pole. I suspect these represent areas of splenic infarction. Superior pole area is wedge-shaped on coronal imaging. There is surrounding stranding adjacent to the inferior pole of the spleen.  Small amount of perihepatic ascites and pelvic ascites. Liver, gallbladder, pancreas, adrenals and kidneys are unremarkable.  Stomach, large and small bowel are unremarkable. No free air or adenopathy. Urinary bladder is unremarkable. Penile prosthesis noted.  No acute bony abnormality.  Atherosclerotic calcifications throughout the infrarenal aorta and iliofemoral vessels.  IMPRESSION: Multiple areas of decreased enhancement throughout the spleen. These are most compatible with splenic infarcts. Exact source is not visualized. No other end organ infarcts noted in the abdomen.  Trace free fluid adjacent to the liver and  in the pelvis.  Large bilateral pleural effusions with compressive atelectasis in the lower lobes. Small pericardial effusion.   Electronically Signed   By: Rolm Baptise M.D.   On: 01/06/2014 16:20   Dg Chest Port 1 View  01/07/2014   CLINICAL DATA:  pleural effusion  EXAM: PORTABLE CHEST - 1 VIEW  COMPARISON:  DG CHEST 2 VIEW dated 08/02/2013  FINDINGS: Low lung volumes. Cardiac silhouette is enlarged. There is blunting of the costophrenic angles. Areas of increased density projects within the lung bases. There is prominence of the interstitial markings and areas of peribronchial cuffing. Osseous structures unremarkable.  IMPRESSION: Interstitial infiltrates likely reflecting pulmonary edema.  Blunted costophrenic angles consistent with small effusions left greater than right  Stable cardiomegaly   Electronically Signed   By: Margaree Mackintosh M.D.   On: 01/07/2014 17:28    ASSESSMENT:  #1. Splenic infarction with normal echocardiogram regarding valvular disorder and no evidence of mesenteric arterial atherosclerosis. #2. Positive lupus anticoagulant, either an acute phase reactant or chronic abnormality, to be reinvestigated at the conclusion of therapy with 6 full months of Xarelto. #3. Ischemic cardiomyopathy with ejection fraction of 40-45% associated with bilateral pleural effusions and small pericardial effusion, diary seen well. #4. Peripheral vascular disease, status post right BKA.   PLAN:  #1. The patient was reassured. He was told to continue Xarelto 20 mg daily and was told to call should he develop any bleeding it would not stopped. #2. Followup in 3 months with CBC and d-dimer.   All questions were answered. The patient knows to call the clinic with any problems, questions or concerns. We can certainly see the patient much sooner if necessary.   I spent 25 minutes counseling the patient face to face. The total time spent in the appointment was 30 minutes I.    Farrel Gobble,  MD 02/04/2014 11:19 AM  DISCLAIMER:  This note was dictated with voice recognition software.  Similar sounding words can inadvertently be transcribed inaccurately and may not be corrected upon review.

## 2014-02-04 NOTE — Addendum Note (Signed)
Addended by: Mellissa Kohut on: 02/04/2014 01:23 PM   Modules accepted: Orders

## 2014-02-05 ENCOUNTER — Encounter: Payer: Self-pay | Admitting: Nurse Practitioner

## 2014-02-05 ENCOUNTER — Ambulatory Visit (INDEPENDENT_AMBULATORY_CARE_PROVIDER_SITE_OTHER): Payer: 59 | Admitting: Nurse Practitioner

## 2014-02-05 VITALS — BP 104/64 | HR 76 | Ht 71.0 in | Wt 217.8 lb

## 2014-02-05 DIAGNOSIS — I5022 Chronic systolic (congestive) heart failure: Secondary | ICD-10-CM

## 2014-02-05 DIAGNOSIS — R0600 Dyspnea, unspecified: Secondary | ICD-10-CM

## 2014-02-05 DIAGNOSIS — I2589 Other forms of chronic ischemic heart disease: Secondary | ICD-10-CM

## 2014-02-05 DIAGNOSIS — R0989 Other specified symptoms and signs involving the circulatory and respiratory systems: Secondary | ICD-10-CM

## 2014-02-05 DIAGNOSIS — I255 Ischemic cardiomyopathy: Secondary | ICD-10-CM

## 2014-02-05 DIAGNOSIS — I259 Chronic ischemic heart disease, unspecified: Secondary | ICD-10-CM

## 2014-02-05 DIAGNOSIS — R0609 Other forms of dyspnea: Secondary | ICD-10-CM

## 2014-02-05 NOTE — Patient Instructions (Signed)
I would like to check lab today - go downstairs to [facility] on the first floor  Stay on your current medicines  Avoid salt  I will talk with Dr. Martinique about possible visit with the heart surgeon  I will see you in 3 months.  Call the [place] office at [phone] if you have any questions, problems or concerns.

## 2014-02-05 NOTE — Progress Notes (Addendum)
Albert [name] Date of Birth: 1961/08/26 Medical Record [id number]  History of Present Illness: Albert [name] is seen back today for a follow up visit. This is a 1 month check. Seen for Dr. Martinique. He is a 53 year old male with multiple medical issues. These include poorly controlled DM, SIRS/MSSA bacteremia from a gangrenous right foot with subsequent right BKA back in September of 2014. During his acute illness he got hypotensive and had positive troponins - was cathed - has severe 3VD with an EF of 40%. TEE showed moderate MR.   Other issues include HLD, CVA, and HTN.   Seen in November by Dr. Roxan Hockey for consideration of CABG - he referred Albert [name] back here - his thoughts were as follows:   Impression:  53 year old gentleman with multiple severe medical problems including poorly controlled, complicated diabetes, peripheral vascular disease, and previous stroke. During a hospitalization for sepsis due to gangrene of his right foot he had a non-Q-wave MI. Cardiac catheterization revealed a left dominant circulation with three-vessel disease equivalent. Echocardiogram was reported his ejection fraction in the 40-50% range. He also has moderate mitral regurgitation.  The ideal treatment for his cardiac disease would be coronary bypass grafting, with possible mitral valve annuloplasty depending on intraoperative findings. This would be an extremely high-risk endeavor in this individual, and I do not think he is a candidate for that surgery currently. He only recently has had his below-knee amputation stump revised and has not completely healed. He remains severely debilitated.  Should his overall condition improved dramatically, we could consider coronary bypass grafting. However even then it would be a difficult situation given the limitations of available conduit. He is not a candidate for bilateral mammaries due to his severe diabetes. He has severe edema in both lower extremities and I suspect  his saphenous vein will be marginal at best. He has had peripheral bypass below the knee on the left side. Wound healing likely will be problematic at best. He has an equivocal Allen's test on the left hand and given his right arm weakness, the left hand is his dominant. Pulse in the right arm is weak.  Overall I think his best option at this point in time his medical therapy. We will refer him back to his cardiologist. I will plan to see him back after the first of the year to see if he has made sufficient progress that surgery is a consideration.   I last saw him back in December - seemed to be stable.   Seen last month and was doing very well. Had his prosthesis and was walking.  Was admitted several days after his last visit with me - had a splenic infarct. Positive lupus anticoagulate. Now on aspirin and Xarelto. Seeing hematology in [place]. To go back there in 3 months.   Comes back today. Here alone. No more belly pain. No bleeding. Feels ok. Walking. Not short of breath. No cough. No scales at home to weigh on. Restricting his salt. No chest pain. Not lightheaded or dizzy.    Current Outpatient Prescriptions  Medication Sig Dispense Refill  . ALPRAZolam (XANAX) 0.25 MG tablet Take 1 tablet (0.25 mg total) by mouth 2 (two) times daily as needed for anxiety.  30 tablet  0  . aspirin EC 81 MG tablet Take 1 tablet (81 mg total) by mouth daily.  30 tablet  1  . carvedilol (COREG) 25 MG tablet Take 1.5 tablets (37.5 mg total) by mouth 2 (two)  times daily with a meal.  90 tablet  2  . ferrous sulfate 325 (65 FE) MG EC tablet Take 325 mg by mouth daily with breakfast.      . furosemide (LASIX) 80 MG tablet Take 1 tablet (80 mg total) by mouth 2 (two) times daily.  60 tablet  1  . hydrALAZINE (APRESOLINE) 10 MG tablet Take 1 tablet (10 mg total) by mouth 3 (three) times daily.  120 tablet  1  . insulin aspart (NOVOLOG) 100 UNIT/ML injection Inject 6 Units into the skin 3 (three) times daily  before meals.       . insulin detemir (LEVEMIR) 100 UNIT/ML injection Inject 0.23 mLs (23 Units total) into the skin at bedtime.  10 mL  12  . Loratadine-Pseudoephedrine (CLARITIN-D 12 HOUR PO) Take 1 tablet by mouth as needed (allergies).       . losartan (COZAAR) 25 MG tablet Take 1 tablet (25 mg total) by mouth daily.  30 tablet  3  . oxyCODONE-acetaminophen (ROXICET) 5-325 MG per tablet Take 1-2 tablets by mouth every 4 (four) hours as needed for severe pain.  30 tablet  0  . pantoprazole (PROTONIX) 40 MG tablet Take 1 tablet (40 mg total) by mouth daily.  30 tablet  1  . polyethylene glycol (MIRALAX / GLYCOLAX) packet Take 17 g by mouth daily as needed for mild constipation.       . potassium chloride (K-DUR) 10 MEQ tablet Take 1 tablet (10 mEq total) by mouth 2 (two) times daily.  60 tablet  3  . promethazine (PHENERGAN) 25 MG tablet Take 1 tab every 6-8 hours for nausea.  40 tablet  1  . Rivaroxaban (XARELTO) 20 MG TABS tablet Take 1 tablet (20 mg total) by mouth daily with supper. To begin once 15mg  tablets are complete  30 tablet  2  . senna-docusate (SENOKOT-S) 8.6-50 MG per tablet Take 1 tablet by mouth as needed.      . simvastatin (ZOCOR) 40 MG tablet Take 1 tablet (40 mg total) by mouth every evening.  30 tablet  1  . tamsulosin (FLOMAX) 0.4 MG CAPS capsule Take 1 capsule (0.4 mg total) by mouth daily after supper.  30 capsule  1   No current facility-administered medications for this visit.    Allergies  Allergen Reactions  . Amoxicillin Nausea And Vomiting    Past Medical History  Diagnosis Date  . Hypertension   . Diabetes mellitus without complication ~4967    last HbA1c ~9  . Hypercholesteremia   . CVA (cerebral infarction) 2011    R hand deficit  . Chronic sinusitis   . Allergic rhinitis   . Chronic cough   . Complication of anesthesia     couldn't swallow and talk  . Stroke   . Splenic infarct     setting of lupus anticoagulant    Past Surgical History    Procedure Laterality Date  . 4th toe amputation Left Jan. 2014    4th toe in [place]  . Tonsillectomy and adenoidectomy    . Penile prosthesis implant    . I&d extremity Left 12/08/2012    Procedure: IRRIGATION AND DEBRIDEMENT EXTREMITY;  Surgeon: Mcarthur Rossetti, MD;  Location: [facility];  Service: Orthopedics;  Laterality: Left;  . Amputation Left 12/08/2012    Procedure: AMPUTATION DIGIT;  Surgeon: Mcarthur Rossetti, MD;  Location: [facility];  Service: Orthopedics;  Laterality: Left;  3rd toe amputation possible 5th toe amputation  .  I&d extremity Left 12/11/2012    Procedure: REPEAT I&D LEFT FOOT;  Surgeon: Mcarthur Rossetti, MD;  Location: [facility];  Service: Orthopedics;  Laterality: Left;  . Amputation Left 12/11/2012    Procedure: Amputation of 2nd Toe;  Surgeon: Mcarthur Rossetti, MD;  Location: [facility];  Service: Orthopedics;  Laterality: Left;  . Bypass graft popliteal to tibial Left 12/13/2012    Procedure: BYPASS GRAFT POPLITEAL TO TIBIAL;  Surgeon: Serafina Mitchell, MD;  Location: [facility];  Service: Vascular;  Laterality: Left;  Left Popliteal to Posterior Tibial Bypass Graft with reversed saphenous vein graft  . Incision and drainage Right 06/25/2013    Procedure: INCISION AND DRAINAGE RIGHT FOOT;  Surgeon: Mcarthur Rossetti, MD;  Location: [facility];  Service: Orthopedics;  Laterality: Right;  . Amputation Right 06/29/2013    Procedure: AMPUTATION BELOW KNEE;  Surgeon: Newt Minion, MD;  Location: [facility];  Service: Orthopedics;  Laterality: Right;  . Tee without cardioversion [name] 07/03/2013    Procedure: TRANSESOPHAGEAL ECHOCARDIOGRAM (TEE);  Surgeon: Larey Dresser, MD;  Location: [facility];  Service: Cardiovascular;  Laterality: [name];  . Stump revision Right 08/16/2013    Procedure: STUMP REVISION- right Below Knee Amputation;  Surgeon: Newt Minion, MD;  Location: [facility];  Service: Orthopedics;  Laterality: Right;    History  Smoking status  . Never Smoker   Smokeless  tobacco  . Never Used    History  Alcohol Use No    Family History  Problem Relation Age of Onset  . Diabetes    . Pancreatic cancer Mother   . Diabetes Mother   . Hyperlipidemia Mother   . Breast cancer Sister   . Depression Maternal Grandmother   . Heart disease Maternal Grandmother   . Depression Maternal Grandfather   . Heart disease Maternal Grandfather   . Depression Paternal Grandmother   . Heart disease Paternal Grandmother   . Depression Paternal Grandfather   . Heart disease Paternal Grandfather     Review of Systems: The review of systems is per the HPI.  All other systems were reviewed and are negative.  Physical Exam: BP 104/64  Pulse 76  Ht 5\' 11"  (1.803 m)  Wt 217 lb 12.8 oz (98.793 kg)  BMI 30.39 kg/m2 Patient is very pleasant and in no acute distress. Looks chronically ill. Weight is up. Skin is warm and dry. Color is normal.  HEENT is unremarkable. Normocephalic/atraumatic. PERRL. Sclera are nonicteric. Neck is supple. No masses. No JVD. Lungs are clear today. Cardiac exam shows a regular rate and rhythm. Abdomen is soft. Extremities are without any edema. Gait and ROM are intact - he has his prosthesis and is walking with a walker. No gross neurologic deficits noted.  Wt Readings from Last 3 Encounters:  02/05/14 217 lb 12.8 oz (98.793 kg)  02/04/14 212 lb 3.2 oz (96.253 kg)  01/09/14 211 lb 10.3 oz (96 kg)     LABORATORY DATA: PENDING  Lab Results  Component Value Date   WBC 8.8 01/09/2014   HGB 11.2* 01/09/2014   HCT 36.6* 01/09/2014   PLT 301 01/09/2014   GLUCOSE 155* 01/09/2014   CHOL 91 12/08/2012   TRIG 73 12/08/2012   HDL 22* 12/08/2012   LDLCALC 54 12/08/2012   ALT 10 01/07/2014   AST 10 01/07/2014   NA 141 01/09/2014   K 3.7 01/09/2014   CL 99 01/09/2014   CREATININE 1.08 01/09/2014   BUN 25* 01/09/2014  CO2 30 01/09/2014   TSH 2.40 01/03/2014   INR 1.20 08/16/2013   HGBA1C 8.2* 06/25/2013   Procedure Performed:  1. Left Heart  Catheterization 2. Selective Coronary Angiography 3. Left ventricular angiogram Operator: Lauree Chandler, MD  Indication: 53 yo WM with uncontrolled DM and HTN, severe PAD admitted with septic shock secondary to necrotic right foot. No chest pain but troponin elevated in setting of profound hypotension. Plans for BKA on right but cardiac cath to exclude CAD before BKA.  Procedure Details:  The risks, benefits, complications, treatment options, and expected outcomes were discussed with the patient. The patient and/or family concurred with the proposed plan, giving informed consent. The patient was brought to the cath lab after IV hydration was begun and oral premedication was given. The patient was further sedated with Versed and Fentanyl. The left groin was prepped and draped in the usual manner. Using the modified Seldinger access technique, a 5 French sheath was placed in the left femoral artery. Standard diagnostic catheters were used to perform selective coronary angiography. A pigtail catheter was used to perform a left ventricular angiogram.  There were no immediate complications. The patient was taken to the recovery area in stable condition.  Hemodynamic Findings:  Central aortic pressure: 90/61  Left ventricular pressure: 90/19/29  Angiographic Findings:  Left main: Short segment without obstruction.  Left Anterior Descending Artery: Moderate caliber diffusely diseased vessel that courses to the apex. The proximal vessel has diffuse 40% stenosis. The mid vessel has a 70% stenosis followed by a 60% stenosis. The distal vessel has diffuse 90% stenosis. The diagonal is a moderate caliber vessel with ostial 99% stenosis with 99% disease extending into the mid vessel.  Circumflex Artery: Large caliber vessel with proximal 80-90% stenosis. There are three small to moderate caliber obtuse marginal branches. The first OM has mid 80% stenosis. The second OM has a proximal 99% stenosis. The third  OM has diffuse 99% stenosis. The left sided posterolateral branch has 99% stenosis.  Right Coronary Artery: Small non-dominant vessel with mid 99% stenosis.  Left Ventricular Angiogram: LVEF=40%  Impression:  1. Triple vessel CAD  2. NSTEMI secondary to demand ischemia from sepsis with severe underlying CAD  3. Mild LV systolic dysfunction  Recommendations: Complex situation. The only option for coronary revascularization is CABG. He is not a favorable candidate for CABG at this time with necrotic right foot, sepsis. Even though not ideal, would favor medical management of CAD while undergoing treatment of his necrotic foot. Would proceed with right BKA this weekend. I have discussed the case with CT surgery who agrees that CABG is not a good option at this time. Will ask CT surgery to see after he recovers from his BKA. Continue ASA, beta blocker, statin.     Echo Study Conclusions from March 2015  - Procedure narrative: Transthoracic echocardiography. Image quality was suboptimal. The study was technically difficult, as a result of poor sound wave transmission. - Left ventricle: The cavity size was normal. Systolic function was difficult to assess due to poor endocardial visualization, but appeared to bemildly to moderately reduced. The estimated ejection fraction was in the range of 40% to 45%. Features are consistent with a pseudonormal left ventricular filling pattern, with concomitant abnormal relaxation and increased filling pressure (grade 2 diastolic dysfunction). Doppler parameters are consistent with both elevated ventricular end-diastolic filling pressure and elevated left atrial filling pressure. Severe asymmetric basal septal hypertrophy (1.77 cm). - Mitral valve: Moderately thickened leaflets . No mitral stenosis  noted. Moderate regurgitation. - Left atrium: The atrium was moderately dilated. - Right ventricle: The cavity size was moderately dilated. Systolic function  was mildly to moderately reduced. - Right atrium: The atrium was moderately dilated. Central venous pressure: 25mm Hg (est). - Atrial septum: No defect or patent foramen ovale was identified. - Tricuspid valve: Mildly thickened leaflets. Moderate regurgitation. - Pulmonic valve: Mildly thickened leaflets. Trivial regurgitation. - Pulmonary arteries: PA peak pressure: 72mm Hg (S). Moderate to severely elevated pulmonary pressures. - Pericardium, extracardiac: A small pericardial effusion was present (measuring 1.2 cm in maximal dimension), with no evidence of tamponade physiology. There was a left pleural effusion.  Assessment / Plan:  1. NSTEMI with severe 3VD with MR as well - No current chest pain. Managed medically. Continues to be more mobile. On good medical regimen with no symptoms but has had a splenic infarct.  See him back in 3 month. Will discuss seeing Dr. Roxan Hockey again but I feel medical management will be continued in light of his other medical issues.   2. Ischemic CM - with EF 40% - seems fairly compensated. No change in his current regimen.  3. Recent splenic infarct - now on Xarelto and aspirin - has had no repeat labs - recheck today.   4. HTN - BP is great. Not dizzy or lightheaded  5. Anemia/+ lupus anticoagulant - seeing Hematology  6. PVD - with prior amputation - now with prosthesis in place and ambulating   8. IDDM - followed by Dr. Nevada Crane  See him back in 3 months. Will discuss with Dr. Martinique about going to see Dr. Roxan Hockey again. No change in his medicines. His overall prognosis does remain quite tenuous given his cardiac and general medical situation.   Patient is agreeable to this plan and will call if any problems develop in the interim.   Burtis Junes, RN, [facility]  [address] [place]  [place],  [address]  [phone]   Discussed with Dr. Martinique - will continue with medical  managemetnt and his current plan of care.

## 2014-02-06 LAB — CBC
HCT: 39.4 % (ref 37.5–51.0)
Hemoglobin: 12.7 g/dL (ref 12.6–17.7)
MCH: 22.8 pg — ABNORMAL LOW (ref 26.6–33.0)
MCHC: 32.2 g/dL (ref 31.5–35.7)
MCV: 71 fL — ABNORMAL LOW (ref 79–97)
Platelets: 261 10*3/uL (ref 150–379)
RBC: 5.58 x10E6/uL (ref 4.14–5.80)
RDW: 21.4 % — ABNORMAL HIGH (ref 12.3–15.4)
WBC: 9.5 10*3/uL (ref 3.4–10.8)

## 2014-02-06 LAB — BASIC METABOLIC PANEL
BUN/Creatinine Ratio: 31 — ABNORMAL HIGH (ref 9–20)
BUN: 44 mg/dL — ABNORMAL HIGH (ref 6–24)
CO2: 19 mmol/L (ref 18–29)
Calcium: 9.4 mg/dL (ref 8.7–10.2)
Chloride: 96 mmol/L — ABNORMAL LOW (ref 97–108)
Creatinine, Ser: 1.41 mg/dL — ABNORMAL HIGH (ref 0.76–1.27)
GFR calc Af Amer: 66 mL/min/{1.73_m2} (ref >59)
GFR calc non Af Amer: 57 mL/min/{1.73_m2} — ABNORMAL LOW (ref >59)
Glucose: 174 mg/dL — ABNORMAL HIGH (ref 65–99)
Potassium: 4.8 mmol/L (ref 3.5–5.2)
Sodium: 137 mmol/L (ref 134–144)

## 2014-02-06 LAB — BRAIN NATRIURETIC PEPTIDE: BNP: 103.9 pg/mL — ABNORMAL HIGH (ref 0.0–100.0)

## 2014-02-07 ENCOUNTER — Telehealth: Payer: Self-pay | Admitting: Nurse Practitioner

## 2014-02-07 ENCOUNTER — Ambulatory Visit: Payer: 59 | Admitting: Physical Therapy

## 2014-02-07 NOTE — Telephone Encounter (Signed)
New Problem:  Pt wants a call back with his recent test results

## 2014-02-07 NOTE — Telephone Encounter (Signed)
Pt aware of lab results and MD's recommendations. Pt will have labs done in his PCP's office in 3 weeks, and have them faxed to [place] when done.

## 2014-02-11 ENCOUNTER — Encounter: Payer: 59 | Admitting: Physical Therapy

## 2014-02-12 ENCOUNTER — Ambulatory Visit: Payer: 59 | Admitting: Physical Therapy

## 2014-02-13 ENCOUNTER — Ambulatory Visit: Payer: 59 | Admitting: Physical Therapy

## 2014-02-17 ENCOUNTER — Ambulatory Visit: Payer: 59 | Attending: Orthopedic Surgery | Admitting: Physical Therapy

## 2014-02-17 DIAGNOSIS — R269 Unspecified abnormalities of gait and mobility: Secondary | ICD-10-CM | POA: Insufficient documentation

## 2014-02-17 DIAGNOSIS — M6281 Muscle weakness (generalized): Secondary | ICD-10-CM | POA: Insufficient documentation

## 2014-02-17 DIAGNOSIS — Z4789 Encounter for other orthopedic aftercare: Secondary | ICD-10-CM | POA: Insufficient documentation

## 2014-02-19 ENCOUNTER — Ambulatory Visit: Payer: 59 | Admitting: Physical Therapy

## 2014-02-25 ENCOUNTER — Ambulatory Visit: Payer: 59 | Admitting: Physical Therapy

## 2014-02-27 ENCOUNTER — Encounter: Payer: 59 | Admitting: Physical Therapy

## 2014-02-28 ENCOUNTER — Encounter: Payer: 59 | Admitting: Physical Therapy

## 2014-03-04 ENCOUNTER — Ambulatory Visit: Payer: 59 | Admitting: Physical Therapy

## 2014-03-07 ENCOUNTER — Encounter: Payer: 59 | Admitting: Physical Therapy

## 2014-03-12 ENCOUNTER — Encounter: Payer: 59 | Admitting: Physical Therapy

## 2014-03-14 ENCOUNTER — Encounter: Payer: 59 | Admitting: Physical Therapy

## 2014-03-18 ENCOUNTER — Ambulatory Visit: Payer: 59 | Attending: Orthopedic Surgery | Admitting: Physical Therapy

## 2014-03-18 DIAGNOSIS — R269 Unspecified abnormalities of gait and mobility: Secondary | ICD-10-CM | POA: Insufficient documentation

## 2014-03-18 DIAGNOSIS — Z4789 Encounter for other orthopedic aftercare: Secondary | ICD-10-CM | POA: Insufficient documentation

## 2014-03-18 DIAGNOSIS — M6281 Muscle weakness (generalized): Secondary | ICD-10-CM | POA: Insufficient documentation

## 2014-03-21 ENCOUNTER — Encounter: Payer: 59 | Admitting: Physical Therapy

## 2014-03-25 ENCOUNTER — Encounter: Payer: 59 | Admitting: Physical Therapy

## 2014-03-26 ENCOUNTER — Encounter: Payer: 59 | Admitting: Physical Therapy

## 2014-03-27 ENCOUNTER — Encounter: Payer: 59 | Admitting: Physical Therapy

## 2014-04-02 ENCOUNTER — Encounter: Payer: 59 | Admitting: Physical Therapy

## 2014-04-30 ENCOUNTER — Encounter: Payer: Self-pay | Admitting: Nurse Practitioner

## 2014-04-30 ENCOUNTER — Ambulatory Visit (INDEPENDENT_AMBULATORY_CARE_PROVIDER_SITE_OTHER): Payer: 59 | Admitting: Nurse Practitioner

## 2014-04-30 VITALS — BP 130/80 | HR 80 | Ht 71.0 in | Wt 238.8 lb

## 2014-04-30 DIAGNOSIS — I2589 Other forms of chronic ischemic heart disease: Secondary | ICD-10-CM

## 2014-04-30 DIAGNOSIS — I259 Chronic ischemic heart disease, unspecified: Secondary | ICD-10-CM

## 2014-04-30 DIAGNOSIS — E7800 Pure hypercholesterolemia, unspecified: Secondary | ICD-10-CM

## 2014-04-30 DIAGNOSIS — I255 Ischemic cardiomyopathy: Secondary | ICD-10-CM

## 2014-04-30 DIAGNOSIS — I5022 Chronic systolic (congestive) heart failure: Secondary | ICD-10-CM

## 2014-04-30 NOTE — Patient Instructions (Signed)
I will see you in 4 months  Cut your eating back - more moderation  Stay on your current medicines  We will check labs when you come back  Call the [place] office at [phone] if you have any questions, problems or concerns.

## 2014-04-30 NOTE — Progress Notes (Addendum)
Albert [name] Date of Birth: 10-Apr-1961 Medical Record #[id number]  History of Present Illness: Albert [name] is seen back today for a follow up visit. This is a 3 month check. Seen for Dr. Martinique. He is a 53 year old male with multiple medical issues. These include poorly controlled DM, SIRS/MSSA bacteremia from a gangrenous right foot with subsequent right BKA back in September of 2014. During his acute illness he got hypotensive and had positive troponins - was cathed - has severe 3VD with an EF of 40%. TEE showed moderate MR.   Other issues include HLD, CVA, and HTN.   Seen in November by Dr. Roxan [name] for consideration of CABG - he referred Albert [name] back here - his thoughts were as follows:  Impression:  53 year old gentleman with multiple severe medical problems including poorly controlled, complicated diabetes, peripheral vascular disease, and previous stroke. During a hospitalization for sepsis due to gangrene of his right foot he had a non-Q-wave MI. Cardiac catheterization revealed a left dominant circulation with three-vessel disease equivalent. Echocardiogram was reported his ejection fraction in the 40-50% range. He also has moderate mitral regurgitation.  The ideal treatment for his cardiac disease would be coronary bypass grafting, with possible mitral valve annuloplasty depending on intraoperative findings. This would be an extremely high-risk endeavor in this individual, and I do not think he is a candidate for that surgery currently. He only recently has had his below-knee amputation stump revised and has not completely healed. He remains severely debilitated.  Should his overall condition improved dramatically, we could consider coronary bypass grafting. However even then it would be a difficult situation given the limitations of available conduit. He is not a candidate for bilateral mammaries due to his severe diabetes. He has severe edema in both lower extremities and I suspect  his saphenous vein will be marginal at best. He has had peripheral bypass below the knee on the left side. Wound healing likely will be problematic at best. He has an equivocal Allen's test on the left hand and given his right arm weakness, the left hand is his dominant. Pulse in the right arm is weak.  Overall I think his best option at this point in time his medical therapy. We will refer him back to his cardiologist. I will plan to see him back after the first of the year to see if he has made sufficient progress that surgery is a consideration.   I last saw him last in April - had just been discharged following a splenic infarct. Positive lupus anticoagulate. Now on aspirin and Xarelto. Seeing hematology in [place].  Comes back today. Here alone. No more belly pain. No bleeding. Feels ok. Walking some but no real exercise. Not short of breath. No cough. No scales at home to weigh on. Restricting his salt. No chest pain. Not lightheaded or dizzy. Admits that he is eating way too much and that his portions are "out of control". Balance still "off". Still waiting on disability to go thru - he is waiting on final outcome. [facility] Seeing hematology next week. Remains on his aspirin and Xarelto.   Current Outpatient Prescriptions  Medication Sig Dispense Refill  . ALPRAZolam (XANAX) 0.25 MG tablet Take 1 tablet (0.25 mg total) by mouth 2 (two) times daily as needed for anxiety.  30 tablet  0  . aspirin EC 81 MG tablet Take 1 tablet (81 mg total) by mouth daily.  Miller  tablet  1  . carvedilol (COREG) 25 MG tablet Take 1.5 tablets (37.5 mg total) by mouth 2 (two) times daily with a meal.  90 tablet  2  . furosemide (LASIX) 80 MG tablet Take 80 mg by mouth daily.      . hydrALAZINE (APRESOLINE) 10 MG tablet Take 1 tablet (10 mg total) by mouth 3 (three) times daily.  120 tablet  1  . insulin aspart (NOVOLOG) 100 UNIT/ML injection Inject 9 Units into the skin 3 (three) times  daily before meals.       . insulin detemir (LEVEMIR) 100 UNIT/ML injection Inject 25 Units into the skin at bedtime.      . Loratadine-Pseudoephedrine (CLARITIN-D 12 HOUR PO) Take 1 tablet by mouth as needed (allergies).       . losartan (COZAAR) 25 MG tablet Take 1 tablet (25 mg total) by mouth daily.  30 tablet  3  . oxyCODONE-acetaminophen (ROXICET) 5-325 MG per tablet Take 1-2 tablets by mouth every 4 (four) hours as needed for severe pain.  30 tablet  0  . pantoprazole (PROTONIX) 40 MG tablet Take 1 tablet (40 mg total) by mouth daily.  30 tablet  1  . polyethylene glycol (MIRALAX / GLYCOLAX) packet Take 17 g by mouth daily as needed for mild constipation.       . potassium chloride (K-DUR) 10 MEQ tablet Take 10 mEq by mouth daily.      . promethazine (PHENERGAN) 25 MG tablet Take 1 tab every 6-8 hours for nausea.  40 tablet  1  . Rivaroxaban (XARELTO) 20 MG TABS tablet Take 1 tablet (20 mg total) by mouth daily with supper. To begin once 15mg  tablets are complete  30 tablet  2  . senna-docusate (SENOKOT-S) 8.6-50 MG per tablet Take 1 tablet by mouth daily.       . simvastatin (ZOCOR) 40 MG tablet Take 1 tablet (40 mg total) by mouth every evening.  30 tablet  1  . tamsulosin (FLOMAX) 0.4 MG CAPS capsule Take 1 capsule (0.4 mg total) by mouth daily after supper.  30 capsule  1  . ferrous sulfate 325 (65 FE) MG EC tablet Take 325 mg by mouth daily with breakfast.       No current facility-administered medications for this visit.    Allergies  Allergen Reactions  . Amoxicillin Nausea And Vomiting    Past Medical History  Diagnosis Date  . Hypertension   . Diabetes mellitus without complication ~3614    last HbA1c ~9  . Hypercholesteremia   . CVA (cerebral infarction) 2011    R hand deficit  . Chronic sinusitis   . Allergic rhinitis   . Chronic cough   . Complication of anesthesia     couldn't swallow and talk  . Stroke   . Splenic infarct     setting of lupus anticoagulant      Past Surgical History  Procedure Laterality Date  . 4th toe amputation Left Jan. 2014    4th toe in [place]  . Tonsillectomy and adenoidectomy    . Penile prosthesis implant    . I&d extremity Left 12/08/2012    Procedure: IRRIGATION AND DEBRIDEMENT EXTREMITY;  Surgeon: Mcarthur Rossetti, MD;  Location: [facility];  Service: Orthopedics;  Laterality: Left;  . Amputation Left 12/08/2012    Procedure: AMPUTATION DIGIT;  Surgeon: Mcarthur Rossetti, MD;  Location: [facility];  Service: Orthopedics;  Laterality: Left;  3rd toe amputation possible 5th toe amputation  .  I&d extremity Left 12/11/2012    Procedure: REPEAT I&D LEFT FOOT;  Surgeon: Mcarthur Rossetti, MD;  Location: [facility];  Service: Orthopedics;  Laterality: Left;  . Amputation Left 12/11/2012    Procedure: Amputation of 2nd Toe;  Surgeon: Mcarthur Rossetti, MD;  Location: [facility];  Service: Orthopedics;  Laterality: Left;  . Bypass graft popliteal to tibial Left 12/13/2012    Procedure: BYPASS GRAFT POPLITEAL TO TIBIAL;  Surgeon: Serafina Mitchell, MD;  Location: [facility];  Service: Vascular;  Laterality: Left;  Left Popliteal to Posterior Tibial Bypass Graft with reversed saphenous vein graft  . Incision and drainage Right 06/25/2013    Procedure: INCISION AND DRAINAGE RIGHT FOOT;  Surgeon: Mcarthur Rossetti, MD;  Location: [facility];  Service: Orthopedics;  Laterality: Right;  . Amputation Right 06/29/2013    Procedure: AMPUTATION BELOW KNEE;  Surgeon: Newt Minion, MD;  Location: [facility];  Service: Orthopedics;  Laterality: Right;  . Tee without cardioversion [name] 07/03/2013    Procedure: TRANSESOPHAGEAL ECHOCARDIOGRAM (TEE);  Surgeon: Larey Dresser, MD;  Location: [facility];  Service: Cardiovascular;  Laterality: [name];  . Stump revision Right 08/16/2013    Procedure: STUMP REVISION- right Below Knee Amputation;  Surgeon: Newt Minion, MD;  Location: [facility];  Service: Orthopedics;  Laterality: Right;    History  Smoking status   . Never Smoker   Smokeless tobacco  . Never Used    History  Alcohol Use No    Family History  Problem Relation Age of Onset  . Diabetes    . Pancreatic cancer Mother   . Diabetes Mother   . Hyperlipidemia Mother   . Breast cancer Sister   . Depression Maternal Grandmother   . Heart disease Maternal Grandmother   . Depression Maternal Grandfather   . Heart disease Maternal Grandfather   . Depression Paternal Grandmother   . Heart disease Paternal Grandmother   . Depression Paternal Grandfather   . Heart disease Paternal Grandfather     Review of Systems: The review of systems is per the HPI.  All other systems were reviewed and are negative.  Physical Exam: BP 130/80  Pulse 80  Ht 5\' 11"  (1.803 m)  Wt 238 lb 12.8 oz (108.319 kg)  BMI 33.32 kg/m2 Patient is very pleasant and in no acute distress. He is obese. Weight is up 26 pounds. Skin is warm and dry. Color is normal.  HEENT is unremarkable. Normocephalic/atraumatic. PERRL. Sclera are nonicteric. Neck is supple. No masses. No JVD. Lungs are clear. Cardiac exam shows a regular rate and rhythm. Abdomen is soft. Extremities are without edema. Gait and ROM are intact. No gross neurologic deficits noted but has chronic right arm weakness. Right BKA - prosthesis in place. Using a cane.  Wt Readings from Last 3 Encounters:  04/30/14 238 lb 12.8 oz (108.319 kg)  02/05/14 217 lb 12.8 oz (98.793 kg)  02/04/14 212 lb 3.2 oz (96.253 kg)    LABORATORY DATA/PROCEDURES:  Lab Results  Component Value Date   WBC 9.5 02/05/2014   HGB 12.7 02/05/2014   HCT 39.4 02/05/2014   PLT 261 02/05/2014   GLUCOSE 174* 02/05/2014   CHOL 91 12/08/2012   TRIG 73 12/08/2012   HDL 22* 12/08/2012   LDLCALC 54 12/08/2012   ALT 10 01/07/2014   AST 10 01/07/2014   NA 137 02/05/2014   K 4.8 02/05/2014   CL 96* 02/05/2014   CREATININE 1.41* 02/05/2014   BUN 44* 02/05/2014  CO2 19 02/05/2014   TSH 2.40 01/03/2014   INR 1.20 08/16/2013   HGBA1C 8.2*  06/25/2013    BNP (last 3 results)  Recent Labs  07/26/13 10/01/13 1157 01/07/14 1730  PROBNP 30099.0* 1302.0* 5530.0*   Procedure Performed:  1. Left Heart Catheterization 2. Selective Coronary Angiography 3. Left ventricular angiogram Angiographic Findings:  Left main: Short segment without obstruction.  Left Anterior Descending Artery: Moderate caliber diffusely diseased vessel that courses to the apex. The proximal vessel has diffuse 40% stenosis. The mid vessel has a 70% stenosis followed by a 60% stenosis. The distal vessel has diffuse 90% stenosis. The diagonal is a moderate caliber vessel with ostial 99% stenosis with 99% disease extending into the mid vessel.  Circumflex Artery: Large caliber vessel with proximal 80-90% stenosis. There are three small to moderate caliber obtuse marginal branches. The first OM has mid 80% stenosis. The second OM has a proximal 99% stenosis. The third OM has diffuse 99% stenosis. The left sided posterolateral branch has 99% stenosis.  Right Coronary Artery: Small non-dominant vessel with mid 99% stenosis.  Left Ventricular Angiogram: LVEF=40%  Impression:  1. Triple vessel CAD  2. NSTEMI secondary to demand ischemia from sepsis with severe underlying CAD  3. Mild LV systolic dysfunction  Recommendations: Complex situation. The only option for coronary revascularization is CABG. He is not a favorable candidate for CABG at this time with necrotic right foot, sepsis. Even though not ideal, would favor medical management of CAD while undergoing treatment of his necrotic foot. Would proceed with right BKA this weekend. I have discussed the case with CT surgery who agrees that CABG is not a good option at this time. Will ask CT surgery to see after he recovers from his BKA. Continue ASA, beta blocker, statin.    Echo Study Conclusions from March 2015  - Procedure narrative: Transthoracic echocardiography. Image quality was suboptimal. The study was  technically difficult, as a result of poor sound wave transmission. - Left ventricle: The cavity size was normal. Systolic function was difficult to assess due to poor endocardial visualization, but appeared to bemildly to moderately reduced. The estimated ejection fraction was in the range of 40% to 45%. Features are consistent with a pseudonormal left ventricular filling pattern, with concomitant abnormal relaxation and increased filling pressure (grade 2 diastolic dysfunction). Doppler parameters are consistent with both elevated ventricular end-diastolic filling pressure and elevated left atrial filling pressure. Severe asymmetric basal septal hypertrophy (1.77 cm). - Mitral valve: Moderately thickened leaflets . No mitral stenosis noted. Moderate regurgitation. - Left atrium: The atrium was moderately dilated. - Right ventricle: The cavity size was moderately dilated. Systolic function was mildly to moderately reduced. - Right atrium: The atrium was moderately dilated. Central venous pressure: 79mm Hg (est). - Atrial septum: No defect or patent foramen ovale was identified. - Tricuspid valve: Mildly thickened leaflets. Moderate regurgitation. - Pulmonic valve: Mildly thickened leaflets. Trivial regurgitation. - Pulmonary arteries: PA peak pressure: 36mm Hg (S). Moderate to severely elevated pulmonary pressures. - Pericardium, extracardiac: A small pericardial effusion was present (measuring 1.2 cm in maximal dimension), with no evidence of tamponade physiology. There was a left pleural effusion.    Assessment / Plan:  1. NSTEMI with severe 3VD with MR as well - No current chest pain. Managed medically with no plans to refer for CABG after prior discussion/review with Dr. Martinique.  On good medical regimen with no symptoms but has had a splenic infarct. No change with current regimen. See  in 4 months.   2. Ischemic CM - with EF 40% - seems fairly compensated. No change in his  current regimen. His weight is up - he has been as high as 270 in the past - does not look to be volume overloaded and admits that his eating is out of control  3. Recent splenic infarct - now on Xarelto and aspirin - seeing hematology next week  4. HTN - BP is great. Not dizzy or lightheaded   5. Anemia/+ lupus anticoagulant - seeing Hematology   6. PVD - with prior amputation - now with prosthesis in place and ambulating   8. IDDM - followed by Dr. Nevada Crane   See back in 4 months.   Patient is agreeable to this plan and will call if any problems develop in the interim.   Burtis Junes, RN, [facility]  [address] [place]  [place], [place] [id number]  [phone]

## 2014-05-06 ENCOUNTER — Other Ambulatory Visit (HOSPITAL_COMMUNITY): Payer: 59

## 2014-05-06 ENCOUNTER — Ambulatory Visit (HOSPITAL_COMMUNITY): Payer: 59

## 2014-06-09 ENCOUNTER — Encounter (HOSPITAL_BASED_OUTPATIENT_CLINIC_OR_DEPARTMENT_OTHER): Payer: 59

## 2014-06-09 ENCOUNTER — Encounter (HOSPITAL_COMMUNITY): Payer: 59 | Attending: Hematology and Oncology

## 2014-06-09 ENCOUNTER — Encounter (HOSPITAL_COMMUNITY): Payer: Self-pay

## 2014-06-09 VITALS — BP 88/53 | HR 69 | Temp 98.2°F | Resp 18 | Wt 239.3 lb

## 2014-06-09 DIAGNOSIS — D7389 Other diseases of spleen: Secondary | ICD-10-CM | POA: Insufficient documentation

## 2014-06-09 DIAGNOSIS — N179 Acute kidney failure, unspecified: Secondary | ICD-10-CM | POA: Insufficient documentation

## 2014-06-09 DIAGNOSIS — S98139A Complete traumatic amputation of one unspecified lesser toe, initial encounter: Secondary | ICD-10-CM | POA: Diagnosis not present

## 2014-06-09 DIAGNOSIS — R29898 Other symptoms and signs involving the musculoskeletal system: Secondary | ICD-10-CM | POA: Diagnosis not present

## 2014-06-09 DIAGNOSIS — I5023 Acute on chronic systolic (congestive) heart failure: Secondary | ICD-10-CM | POA: Diagnosis not present

## 2014-06-09 DIAGNOSIS — Z794 Long term (current) use of insulin: Secondary | ICD-10-CM | POA: Diagnosis not present

## 2014-06-09 DIAGNOSIS — I69998 Other sequelae following unspecified cerebrovascular disease: Secondary | ICD-10-CM | POA: Diagnosis not present

## 2014-06-09 DIAGNOSIS — I509 Heart failure, unspecified: Secondary | ICD-10-CM | POA: Diagnosis not present

## 2014-06-09 DIAGNOSIS — Z79899 Other long term (current) drug therapy: Secondary | ICD-10-CM | POA: Insufficient documentation

## 2014-06-09 DIAGNOSIS — Z880 Allergy status to penicillin: Secondary | ICD-10-CM | POA: Diagnosis not present

## 2014-06-09 DIAGNOSIS — Z8249 Family history of ischemic heart disease and other diseases of the circulatory system: Secondary | ICD-10-CM | POA: Diagnosis not present

## 2014-06-09 DIAGNOSIS — J309 Allergic rhinitis, unspecified: Secondary | ICD-10-CM | POA: Insufficient documentation

## 2014-06-09 DIAGNOSIS — D68312 Antiphospholipid antibody with hemorrhagic disorder: Secondary | ICD-10-CM | POA: Diagnosis present

## 2014-06-09 DIAGNOSIS — E7800 Pure hypercholesterolemia, unspecified: Secondary | ICD-10-CM | POA: Insufficient documentation

## 2014-06-09 DIAGNOSIS — I129 Hypertensive chronic kidney disease with stage 1 through stage 4 chronic kidney disease, or unspecified chronic kidney disease: Secondary | ICD-10-CM | POA: Insufficient documentation

## 2014-06-09 DIAGNOSIS — I2589 Other forms of chronic ischemic heart disease: Secondary | ICD-10-CM | POA: Insufficient documentation

## 2014-06-09 DIAGNOSIS — Z7982 Long term (current) use of aspirin: Secondary | ICD-10-CM | POA: Insufficient documentation

## 2014-06-09 DIAGNOSIS — I2510 Atherosclerotic heart disease of native coronary artery without angina pectoris: Secondary | ICD-10-CM | POA: Insufficient documentation

## 2014-06-09 DIAGNOSIS — S88119A Complete traumatic amputation at level between knee and ankle, unspecified lower leg, initial encounter: Secondary | ICD-10-CM | POA: Insufficient documentation

## 2014-06-09 DIAGNOSIS — R34 Anuria and oliguria: Secondary | ICD-10-CM | POA: Insufficient documentation

## 2014-06-09 DIAGNOSIS — I739 Peripheral vascular disease, unspecified: Secondary | ICD-10-CM | POA: Insufficient documentation

## 2014-06-09 DIAGNOSIS — E878 Other disorders of electrolyte and fluid balance, not elsewhere classified: Secondary | ICD-10-CM | POA: Diagnosis not present

## 2014-06-09 DIAGNOSIS — I252 Old myocardial infarction: Secondary | ICD-10-CM | POA: Insufficient documentation

## 2014-06-09 DIAGNOSIS — D735 Infarction of spleen: Secondary | ICD-10-CM

## 2014-06-09 DIAGNOSIS — Z7901 Long term (current) use of anticoagulants: Secondary | ICD-10-CM | POA: Diagnosis not present

## 2014-06-09 DIAGNOSIS — R7881 Bacteremia: Secondary | ICD-10-CM | POA: Diagnosis not present

## 2014-06-09 DIAGNOSIS — A4901 Methicillin susceptible Staphylococcus aureus infection, unspecified site: Secondary | ICD-10-CM | POA: Insufficient documentation

## 2014-06-09 DIAGNOSIS — I70209 Unspecified atherosclerosis of native arteries of extremities, unspecified extremity: Secondary | ICD-10-CM | POA: Diagnosis not present

## 2014-06-09 DIAGNOSIS — I959 Hypotension, unspecified: Secondary | ICD-10-CM | POA: Diagnosis not present

## 2014-06-09 DIAGNOSIS — J90 Pleural effusion, not elsewhere classified: Secondary | ICD-10-CM | POA: Insufficient documentation

## 2014-06-09 DIAGNOSIS — IMO0001 Reserved for inherently not codable concepts without codable children: Secondary | ICD-10-CM | POA: Insufficient documentation

## 2014-06-09 DIAGNOSIS — N189 Chronic kidney disease, unspecified: Secondary | ICD-10-CM | POA: Diagnosis not present

## 2014-06-09 DIAGNOSIS — E875 Hyperkalemia: Secondary | ICD-10-CM | POA: Diagnosis not present

## 2014-06-09 DIAGNOSIS — J329 Chronic sinusitis, unspecified: Secondary | ICD-10-CM | POA: Diagnosis not present

## 2014-06-09 DIAGNOSIS — E1165 Type 2 diabetes mellitus with hyperglycemia: Secondary | ICD-10-CM

## 2014-06-09 DIAGNOSIS — Z833 Family history of diabetes mellitus: Secondary | ICD-10-CM | POA: Insufficient documentation

## 2014-06-09 DIAGNOSIS — I059 Rheumatic mitral valve disease, unspecified: Secondary | ICD-10-CM | POA: Insufficient documentation

## 2014-06-09 LAB — BASIC METABOLIC PANEL
Anion gap: 14 (ref 5–15)
BUN: 23 mg/dL (ref 6–23)
CO2: 23 meq/L (ref 19–32)
CREATININE: 1.22 mg/dL (ref 0.50–1.35)
Calcium: 8.8 mg/dL (ref 8.4–10.5)
Chloride: 100 mEq/L (ref 96–112)
GFR calc non Af Amer: 67 mL/min — ABNORMAL LOW (ref >90)
GFR, EST AFRICAN AMERICAN: 78 mL/min — AB (ref >90)
Glucose, Bld: 308 mg/dL — ABNORMAL HIGH (ref 70–99)
Potassium: 4.2 mEq/L (ref 3.7–5.3)
Sodium: 137 mEq/L (ref 137–147)

## 2014-06-09 LAB — CBC WITH DIFFERENTIAL/PLATELET
BASOS PCT: 1 % (ref 0–1)
Basophils Absolute: 0.1 10*3/uL (ref 0.0–0.1)
Eosinophils Absolute: 0.3 10*3/uL (ref 0.0–0.7)
Eosinophils Relative: 5 % (ref 0–5)
HEMATOCRIT: 33.2 % — AB (ref 39.0–52.0)
Hemoglobin: 11 g/dL — ABNORMAL LOW (ref 13.0–17.0)
LYMPHS PCT: 21 % (ref 12–46)
Lymphs Abs: 1.4 10*3/uL (ref 0.7–4.0)
MCH: 27.1 pg (ref 26.0–34.0)
MCHC: 33.1 g/dL (ref 30.0–36.0)
MCV: 81.8 fL (ref 78.0–100.0)
MONO ABS: 0.6 10*3/uL (ref 0.1–1.0)
Monocytes Relative: 8 % (ref 3–12)
NEUTROS PCT: 65 % (ref 43–77)
Neutro Abs: 4.5 10*3/uL (ref 1.7–7.7)
PLATELETS: 192 10*3/uL (ref 150–400)
RBC: 4.06 MIL/uL — ABNORMAL LOW (ref 4.22–5.81)
RDW: 12.6 % (ref 11.5–15.5)
WBC: 6.8 10*3/uL (ref 4.0–10.5)

## 2014-06-09 LAB — D-DIMER, QUANTITATIVE (NOT AT ARMC): D DIMER QUANT: 0.29 ug{FEU}/mL (ref 0.00–0.48)

## 2014-06-09 NOTE — Progress Notes (Signed)
[facility]  OFFICE PROGRESS NOTE  [place], Thedore Mins, MD  [place] [address]  DIAGNOSIS: Infarction of spleen - Plan: CBC with Differential, D-dimer, quantitative, Lupus anticoagulant panel, CT Abdomen W Contrast, Basic metabolic panel, Basic metabolic panel  Lupus anticoagulant (LAC) with hemorrhagic disorder - Plan: CBC with Differential, D-dimer, quantitative, Lupus anticoagulant panel, CT Abdomen W Contrast, Basic metabolic panel, Basic metabolic panel  Chief Complaint  Patient presents with  . Splenic infarct  . Lupus anticoagulant    CURRENT THERAPY: Xarelto  INTERVAL HISTORY: Albert [name] 53 y.o. male returns for followup of splenic infarction found at the time of hospitalization at which time Xarelto was started in the setting of ischemic cardiomyopathy and large pleural effusions with evidence of a lupus anticoagulant.  He continues to do well with no episodes of epistaxis, melena, hematochezia, hematuria, or hemoptysis. He continues to use a cane. Right-sided weakness is still present along with prosthesis for her right BKA with no stump redness or infection. He denies any chest pain, PND, orthopnea, palpitations, skin rash, headache, lower extremity swelling or redness, or seizures.  MEDICAL HISTORY: Past Medical History  Diagnosis Date  . Hypertension   . Diabetes mellitus without complication ~9381    last HbA1c ~9  . Hypercholesteremia   . CVA (cerebral infarction) 2011    R hand deficit  . Chronic sinusitis   . Allergic rhinitis   . Chronic cough   . Complication of anesthesia     couldn't swallow and talk  . Stroke   . Splenic infarct     setting of lupus anticoagulant    INTERIM HISTORY: has Hypercholesteremia; Gangrene; Diabetes mellitus out of control; Essential hypertension, benign; Atherosclerotic peripheral vascular disease; Peripheral vascular disease, unspecified; Aftercare following surgery  of the circulatory system, NEC; AKI (acute kidney injury); Foot abscess, right; Shock circulatory; Dehydration with hyponatremia; Leukocytosis, unspecified; Sepsis(995.91); NSTEMI (non-ST elevated myocardial infarction); Coronary atherosclerosis of native coronary artery; Cardiomyopathy, ischemic; Hx of BKA; Acute pulmonary edema; Pneumonia, organism unspecified; Hypoxemia; Acute systolic heart failure; Acute kidney failure; Hypotension, unspecified; Anemia; Hyperkalemia; Hyposmolality and/or hyponatremia; Acute respiratory failure; Mitral regurgitation; Oliguria; Bacteremia due to Staphylococcus aureus; Hyperchloremia; Hx of right BKA; Acute systolic CHF (congestive heart failure); Splenic infarct; and Splenic infarction on his problem list.    ALLERGIES:  is allergic to amoxicillin.  MEDICATIONS: has a current medication list which includes the following prescription(s): alprazolam, aspirin ec, carvedilol, ferrous sulfate, furosemide, hydralazine, insulin aspart, insulin detemir, loratadine-pseudoephedrine, losartan, oxycodone-acetaminophen, pantoprazole, polyethylene glycol, potassium chloride, promethazine, rivaroxaban, senna-docusate, simvastatin, and tamsulosin.  SURGICAL HISTORY:  Past Surgical History  Procedure Laterality Date  . 4th toe amputation Left Jan. 2014    4th toe in [place]  . Tonsillectomy and adenoidectomy    . Penile prosthesis implant    . I&d extremity Left 12/08/2012    Procedure: IRRIGATION AND DEBRIDEMENT EXTREMITY;  Surgeon: Mcarthur Rossetti, MD;  Location: [facility];  Service: Orthopedics;  Laterality: Left;  . Amputation Left 12/08/2012    Procedure: AMPUTATION DIGIT;  Surgeon: Mcarthur Rossetti, MD;  Location: [facility];  Service: Orthopedics;  Laterality: Left;  3rd toe amputation possible 5th toe amputation  . I&d extremity Left 12/11/2012    Procedure: REPEAT I&D LEFT FOOT;  Surgeon: Mcarthur Rossetti, MD;  Location: [facility];  Service: Orthopedics;  Laterality:  Left;  . Amputation Left 12/11/2012    Procedure: Amputation of 2nd  Toe;  Surgeon: Mcarthur Rossetti, MD;  Location: [facility];  Service: Orthopedics;  Laterality: Left;  . Bypass graft popliteal to tibial Left 12/13/2012    Procedure: BYPASS GRAFT POPLITEAL TO TIBIAL;  Surgeon: Serafina Mitchell, MD;  Location: [facility];  Service: Vascular;  Laterality: Left;  Left Popliteal to Posterior Tibial Bypass Graft with reversed saphenous vein graft  . Incision and drainage Right 06/25/2013    Procedure: INCISION AND DRAINAGE RIGHT FOOT;  Surgeon: Mcarthur Rossetti, MD;  Location: [facility];  Service: Orthopedics;  Laterality: Right;  . Amputation Right 06/29/2013    Procedure: AMPUTATION BELOW KNEE;  Surgeon: Newt Minion, MD;  Location: [facility];  Service: Orthopedics;  Laterality: Right;  . Tee without cardioversion [name] 07/03/2013    Procedure: TRANSESOPHAGEAL ECHOCARDIOGRAM (TEE);  Surgeon: Larey Dresser, MD;  Location: [facility];  Service: Cardiovascular;  Laterality: [name];  . Stump revision Right 08/16/2013    Procedure: STUMP REVISION- right Below Knee Amputation;  Surgeon: Newt Minion, MD;  Location: [facility];  Service: Orthopedics;  Laterality: Right;    FAMILY HISTORY: family history includes Breast cancer in his sister; Depression in his maternal grandfather, maternal grandmother, paternal grandfather, and paternal grandmother; Diabetes in his mother and another family member; Heart disease in his maternal grandfather, maternal grandmother, paternal grandfather, and paternal grandmother; Hyperlipidemia in his mother; Pancreatic cancer in his mother.  SOCIAL HISTORY:  reports that he has never smoked. He has never used smokeless tobacco. He reports that he does not drink alcohol or use illicit drugs.  REVIEW OF SYSTEMS:  Other than that discussed above is noncontributory.  PHYSICAL EXAMINATION: ECOG PERFORMANCE STATUS: 1 - Symptomatic but completely ambulatory  Blood pressure 88/53, pulse 69,  temperature 98.2 F (36.8 C), temperature source Oral, resp. rate 18, weight 239 lb 4.8 oz (108.546 kg).  GENERAL:alert, no distress and comfortable. No evidence of pulsus paradoxus is. SKIN: skin color, texture, turgor are normal, no rashes or significant lesions EYES: PERLA; Conjunctiva are pink and non-injected, sclera clear SINUSES: No redness or tenderness over maxillary or ethmoid sinuses OROPHARYNX:no exudate, no erythema on lips, buccal mucosa, or tongue. NECK: supple, thyroid normal size, non-tender, without nodularity. No masses CHEST: Increased AP diameter with no breast masses. LYMPH:  no palpable lymphadenopathy in the cervical, axillary or inguinal LUNGS:Decreased breath sounds at the base with dullness. HEART: regular rate & rhythm and no murmurs.. Grade 2/6 pansystolic murmur at the apex radiating to the left axilla. ABDOMEN:abdomen soft, non-tender and normal bowel sounds MUSCULOSKELETAL:no cyanosis of digits and no clubbing. Range of motion normal.. Right below the knee amputation with no evidence of stump redness or hemorrhage.  NEURO: alert & oriented x 3 with fluent speech, no focal motor/sensory deficits. Right upper extremity weakness with deformity, grade 3/5.   LABORATORY DATA: Appointment on 06/09/2014  Component Date Value Ref Range Status  . WBC 06/09/2014 6.8  4.0 - 10.5 K/uL Final  . RBC 06/09/2014 4.06* 4.22 - 5.81 MIL/uL Final  . Hemoglobin 06/09/2014 11.0* 13.0 - 17.0 g/dL Final  . HCT 06/09/2014 33.2* 39.0 - 52.0 % Final  . MCV 06/09/2014 81.8  78.0 - 100.0 fL Final  . MCH 06/09/2014 27.1  26.0 - 34.0 pg Final  . MCHC 06/09/2014 33.1  30.0 - 36.0 g/dL Final  . RDW 06/09/2014 12.6  11.5 - 15.5 % Final  . Platelets 06/09/2014 192  150 - 400 K/uL Final  . Neutrophils Relative % 06/09/2014 65  43 -  77 % Final  . Neutro Abs 06/09/2014 4.5  1.7 - 7.7 K/uL Final  . Lymphocytes Relative 06/09/2014 21  12 - 46 % Final  . Lymphs Abs 06/09/2014 1.4  0.7 - 4.0  K/uL Final  . Monocytes Relative 06/09/2014 8  3 - 12 % Final  . Monocytes Absolute 06/09/2014 0.6  0.1 - 1.0 K/uL Final  . Eosinophils Relative 06/09/2014 5  0 - 5 % Final  . Eosinophils Absolute 06/09/2014 0.3  0.0 - 0.7 K/uL Final  . Basophils Relative 06/09/2014 1  0 - 1 % Final  . Basophils Absolute 06/09/2014 0.1  0.0 - 0.1 K/uL Final  . D-Dimer, Quant 06/09/2014 0.29  0.00 - 0.48 ug/mL-FEU Final   Comment:                                 AT THE INHOUSE ESTABLISHED CUTOFF                          VALUE OF 0.48 ug/mL FEU,                          THIS ASSAY HAS BEEN DOCUMENTED                          IN THE LITERATURE TO HAVE                          A SENSITIVITY AND NEGATIVE                          PREDICTIVE VALUE OF AT LEAST                          98 TO 99%.  THE TEST RESULT                          SHOULD BE CORRELATED WITH                          AN ASSESSMENT OF THE CLINICAL                          PROBABILITY OF DVT / VTE.    PATHOLOGY: No new pathology.  Urinalysis    Component Value Date/Time   COLORURINE AMBER* 01/06/2014 1455   APPEARANCEUR CLEAR 01/06/2014 1455   LABSPEC >1.030* 01/06/2014 1455   PHURINE 5.5 01/06/2014 1455   GLUCOSEU NEGATIVE 01/06/2014 1455   HGBUR TRACE* 01/06/2014 1455   BILIRUBINUR NEGATIVE 01/06/2014 1455   KETONESUR NEGATIVE 01/06/2014 1455   PROTEINUR 100* 01/06/2014 1455   UROBILINOGEN 0.2 01/06/2014 1455   NITRITE NEGATIVE 01/06/2014 1455   LEUKOCYTESUR NEGATIVE 01/06/2014 1455    RADIOGRAPHIC STUDIES: No results found.  ASSESSMENT:  #1. Splenic infarction with normal echocardiogram regarding valvular disorder and no evidence of mesenteric arterial atherosclerosis.  #2. Positive lupus anticoagulant, either an acute phase reactant or chronic abnormality, to be reinvestigated at the conclusion of therapy with 6 full months of Xarelto.  #3. Ischemic cardiomyopathy with ejection fraction of 40-45% associated with bilateral pleural  effusions and small pericardial effusion, compensated.  #4. Peripheral vascular disease,  status post right BKA    PLAN:  #1. Continue Xarelto 20 mg daily. #2. Repeat CT of the abdomen with contrast in 3 weeks. #3. Followup in 4 weeks with CBC, d-dimer, and lupus anticoagulant.   All questions were answered. The patient knows to call the clinic with any problems, questions or concerns. We can certainly see the patient much sooner if necessary.   I spent 25 minutes counseling the patient face to face. The total time spent in the appointment was 30 minutes.    Doroteo Bradford, MD 06/09/2014 10:11 AM  DISCLAIMER:  This note was dictated with voice recognition software.  Similar sounding words can inadvertently be transcribed inaccurately and may not be corrected upon review.

## 2014-06-09 NOTE — Progress Notes (Signed)
LABS FOR LUPANC,DDIM,BMP,CBCD

## 2014-06-09 NOTE — Patient Instructions (Signed)
[facility] Discharge Instructions  RECOMMENDATIONS MADE BY THE CONSULTANT AND ANY TEST RESULTS WILL BE SENT TO YOUR REFERRING PHYSICIAN.  EXAM FINDINGS BY THE PHYSICIAN TODAY AND SIGNS OR SYMPTOMS TO REPORT TO CLINIC OR PRIMARY PHYSICIAN: Exam and findings as discussed by Dr. Barnet [place].  Will check some blood work today and will get a scan of your abdomen in 3 weeks to check the area of previous infarct and will see you back in the following week.     INSTRUCTIONS/FOLLOW-UP: Follow-up after scans.  Thank you for choosing [facility] to provide your oncology and hematology care.  To afford each patient quality time with our providers, please arrive at least 15 minutes before your scheduled appointment time.  With your help, our goal is to use those 15 minutes to complete the necessary work-up to ensure our physicians have the information they need to help with your evaluation and healthcare recommendations.    Effective January 1st, 2014, we ask that you re-schedule your appointment with our physicians should you arrive 10 or more minutes late for your appointment.  We strive to give you quality time with our providers, and arriving late affects you and other patients whose appointments are after yours.    Again, thank you for choosing [facility].  Our hope is that these requests will decrease the amount of time that you wait before being seen by our physicians.       _____________________________________________________________  Should you have questions after your visit to [facility], please contact our office at [phone] between the hours of 8:30 a.m. and 4:30 p.m.  Voicemails left after 4:30 p.m. will not be returned until the following business day.  For prescription refill requests, have your pharmacy contact our office with your prescription refill request.     _______________________________________________________________  We hope that we have given you very good care.  You may receive a patient satisfaction survey in the mail, please complete it and return it as soon as possible.  We value your feedback!  _______________________________________________________________  Have you asked about our STAR program?  STAR stands for Survivorship Training and Rehabilitation, and this is a nationally recognized cancer care program that focuses on survivorship and rehabilitation.  Cancer and cancer treatments may cause problems, such as, pain, making you feel tired and keeping you from doing the things that you need or want to do. Cancer rehabilitation can help. Our goal is to reduce these troubling effects and help you have the best quality of life possible.  You may receive a survey from a nurse that asks questions about your current state of health.  Based on the survey results, all eligible patients will be referred to the [facility] program for an evaluation so we can better serve you!  A frequently asked questions sheet is available upon request.

## 2014-06-10 LAB — LUPUS ANTICOAGULANT PANEL
DRVVT INCUBATED 1 1 MIX: 45.2 s — AB (ref <42.9)
DRVVT: 69.1 secs — ABNORMAL HIGH (ref <42.9)
Drvvt confirmation: 0.99 Ratio (ref <1.15)
Lupus Anticoagulant: NOT DETECTED
PTT LA: 58.8 s — AB (ref 28.0–43.0)
PTTLA 41 MIX: 50.8 s — AB (ref 28.0–43.0)
PTTLA CONFIRMATION: 1.3 s (ref <8.0)

## 2014-06-30 ENCOUNTER — Ambulatory Visit (HOSPITAL_COMMUNITY)
Admission: RE | Admit: 2014-06-30 | Discharge: 2014-06-30 | Disposition: A | Discharge status: Home / Self Care | Payer: 59 | Source: Ambulatory Visit | Attending: Hematology and Oncology | Admitting: Hematology and Oncology

## 2014-06-30 DIAGNOSIS — E119 Type 2 diabetes mellitus without complications: Secondary | ICD-10-CM | POA: Diagnosis not present

## 2014-06-30 DIAGNOSIS — D68312 Antiphospholipid antibody with hemorrhagic disorder: Secondary | ICD-10-CM | POA: Diagnosis not present

## 2014-06-30 DIAGNOSIS — D7389 Other diseases of spleen: Secondary | ICD-10-CM | POA: Insufficient documentation

## 2014-06-30 DIAGNOSIS — D735 Infarction of spleen: Secondary | ICD-10-CM

## 2014-06-30 MED ORDER — IOHEXOL 300 MG/ML  SOLN
100.0000 mL | Freq: Once | INTRAMUSCULAR | Status: AC | PRN
  Administered 2014-06-30: 100 mL via INTRAVENOUS

## 2014-07-07 ENCOUNTER — Encounter (HOSPITAL_COMMUNITY): Payer: 59 | Attending: Hematology and Oncology

## 2014-07-07 ENCOUNTER — Encounter (HOSPITAL_COMMUNITY): Payer: Self-pay

## 2014-07-07 ENCOUNTER — Encounter (HOSPITAL_BASED_OUTPATIENT_CLINIC_OR_DEPARTMENT_OTHER): Payer: 59

## 2014-07-07 VITALS — BP 109/60 | HR 64 | Temp 98.4°F | Resp 18 | Wt 239.0 lb

## 2014-07-07 DIAGNOSIS — E875 Hyperkalemia: Secondary | ICD-10-CM | POA: Diagnosis not present

## 2014-07-07 DIAGNOSIS — Z880 Allergy status to penicillin: Secondary | ICD-10-CM | POA: Diagnosis not present

## 2014-07-07 DIAGNOSIS — S88119A Complete traumatic amputation at level between knee and ankle, unspecified lower leg, initial encounter: Secondary | ICD-10-CM | POA: Diagnosis not present

## 2014-07-07 DIAGNOSIS — I059 Rheumatic mitral valve disease, unspecified: Secondary | ICD-10-CM | POA: Diagnosis not present

## 2014-07-07 DIAGNOSIS — I2589 Other forms of chronic ischemic heart disease: Secondary | ICD-10-CM | POA: Diagnosis not present

## 2014-07-07 DIAGNOSIS — D7389 Other diseases of spleen: Secondary | ICD-10-CM | POA: Insufficient documentation

## 2014-07-07 DIAGNOSIS — IMO0001 Reserved for inherently not codable concepts without codable children: Secondary | ICD-10-CM | POA: Diagnosis not present

## 2014-07-07 DIAGNOSIS — I129 Hypertensive chronic kidney disease with stage 1 through stage 4 chronic kidney disease, or unspecified chronic kidney disease: Secondary | ICD-10-CM | POA: Diagnosis not present

## 2014-07-07 DIAGNOSIS — D735 Infarction of spleen: Secondary | ICD-10-CM

## 2014-07-07 DIAGNOSIS — J329 Chronic sinusitis, unspecified: Secondary | ICD-10-CM | POA: Insufficient documentation

## 2014-07-07 DIAGNOSIS — E878 Other disorders of electrolyte and fluid balance, not elsewhere classified: Secondary | ICD-10-CM | POA: Diagnosis not present

## 2014-07-07 DIAGNOSIS — J90 Pleural effusion, not elsewhere classified: Secondary | ICD-10-CM | POA: Diagnosis not present

## 2014-07-07 DIAGNOSIS — J309 Allergic rhinitis, unspecified: Secondary | ICD-10-CM | POA: Diagnosis not present

## 2014-07-07 DIAGNOSIS — S98139A Complete traumatic amputation of one unspecified lesser toe, initial encounter: Secondary | ICD-10-CM | POA: Diagnosis not present

## 2014-07-07 DIAGNOSIS — I70209 Unspecified atherosclerosis of native arteries of extremities, unspecified extremity: Secondary | ICD-10-CM | POA: Diagnosis not present

## 2014-07-07 DIAGNOSIS — R29898 Other symptoms and signs involving the musculoskeletal system: Secondary | ICD-10-CM | POA: Diagnosis not present

## 2014-07-07 DIAGNOSIS — Z7901 Long term (current) use of anticoagulants: Secondary | ICD-10-CM | POA: Insufficient documentation

## 2014-07-07 DIAGNOSIS — R34 Anuria and oliguria: Secondary | ICD-10-CM | POA: Insufficient documentation

## 2014-07-07 DIAGNOSIS — N189 Chronic kidney disease, unspecified: Secondary | ICD-10-CM | POA: Diagnosis not present

## 2014-07-07 DIAGNOSIS — Z794 Long term (current) use of insulin: Secondary | ICD-10-CM | POA: Diagnosis not present

## 2014-07-07 DIAGNOSIS — N179 Acute kidney failure, unspecified: Secondary | ICD-10-CM | POA: Diagnosis not present

## 2014-07-07 DIAGNOSIS — Z8249 Family history of ischemic heart disease and other diseases of the circulatory system: Secondary | ICD-10-CM | POA: Insufficient documentation

## 2014-07-07 DIAGNOSIS — I252 Old myocardial infarction: Secondary | ICD-10-CM | POA: Insufficient documentation

## 2014-07-07 DIAGNOSIS — I959 Hypotension, unspecified: Secondary | ICD-10-CM | POA: Insufficient documentation

## 2014-07-07 DIAGNOSIS — Z833 Family history of diabetes mellitus: Secondary | ICD-10-CM | POA: Insufficient documentation

## 2014-07-07 DIAGNOSIS — A4901 Methicillin susceptible Staphylococcus aureus infection, unspecified site: Secondary | ICD-10-CM | POA: Diagnosis not present

## 2014-07-07 DIAGNOSIS — I739 Peripheral vascular disease, unspecified: Secondary | ICD-10-CM | POA: Diagnosis not present

## 2014-07-07 DIAGNOSIS — Z7982 Long term (current) use of aspirin: Secondary | ICD-10-CM | POA: Insufficient documentation

## 2014-07-07 DIAGNOSIS — I69998 Other sequelae following unspecified cerebrovascular disease: Secondary | ICD-10-CM | POA: Diagnosis not present

## 2014-07-07 DIAGNOSIS — E7800 Pure hypercholesterolemia, unspecified: Secondary | ICD-10-CM | POA: Diagnosis not present

## 2014-07-07 DIAGNOSIS — E1165 Type 2 diabetes mellitus with hyperglycemia: Secondary | ICD-10-CM

## 2014-07-07 DIAGNOSIS — I2510 Atherosclerotic heart disease of native coronary artery without angina pectoris: Secondary | ICD-10-CM | POA: Diagnosis not present

## 2014-07-07 DIAGNOSIS — R7881 Bacteremia: Secondary | ICD-10-CM | POA: Diagnosis not present

## 2014-07-07 DIAGNOSIS — I509 Heart failure, unspecified: Secondary | ICD-10-CM | POA: Diagnosis not present

## 2014-07-07 DIAGNOSIS — D68312 Antiphospholipid antibody with hemorrhagic disorder: Secondary | ICD-10-CM | POA: Diagnosis present

## 2014-07-07 DIAGNOSIS — Z79899 Other long term (current) drug therapy: Secondary | ICD-10-CM | POA: Insufficient documentation

## 2014-07-07 DIAGNOSIS — I5023 Acute on chronic systolic (congestive) heart failure: Secondary | ICD-10-CM | POA: Insufficient documentation

## 2014-07-07 LAB — CBC WITH DIFFERENTIAL/PLATELET
BASOS ABS: 0.1 10*3/uL (ref 0.0–0.1)
Basophils Relative: 1 % (ref 0–1)
Eosinophils Absolute: 0.4 10*3/uL (ref 0.0–0.7)
Eosinophils Relative: 5 % (ref 0–5)
HEMATOCRIT: 34.5 % — AB (ref 39.0–52.0)
HEMOGLOBIN: 11.3 g/dL — AB (ref 13.0–17.0)
LYMPHS PCT: 22 % (ref 12–46)
Lymphs Abs: 1.6 10*3/uL (ref 0.7–4.0)
MCH: 27.1 pg (ref 26.0–34.0)
MCHC: 32.8 g/dL (ref 30.0–36.0)
MCV: 82.7 fL (ref 78.0–100.0)
Monocytes Absolute: 0.7 10*3/uL (ref 0.1–1.0)
Monocytes Relative: 9 % (ref 3–12)
Neutro Abs: 4.7 10*3/uL (ref 1.7–7.7)
Neutrophils Relative %: 63 % (ref 43–77)
PLATELETS: 223 10*3/uL (ref 150–400)
RBC: 4.17 MIL/uL — AB (ref 4.22–5.81)
RDW: 12.9 % (ref 11.5–15.5)
WBC: 7.4 10*3/uL (ref 4.0–10.5)

## 2014-07-07 LAB — D-DIMER, QUANTITATIVE (NOT AT ARMC): D-Dimer, Quant: <0.27 ug/mL-FEU (ref 0.00–0.48)

## 2014-07-07 NOTE — Progress Notes (Signed)
LAS FOR CBCD,DDIM

## 2014-07-07 NOTE — Progress Notes (Signed)
[place]  OFFICE PROGRESS NOTE  [place], [facility], MD  [place] [address]  DIAGNOSIS: Infarction of spleen - Plan: CBC with Differential, D-dimer, quantitative  Chief Complaint  Patient presents with  . Splenic infarction  . Peripheral vascular disease    CURRENT THERAPY: Xarelto 20 mg daily  INTERVAL HISTORY: Albert [name] 53 y.o. male returns for followup of splenic infarction found at the time of hospitalization at which time Xarelto was started in the setting of ischemic cardiomyopathy and large pleural effusions with evidence of a lupus anticoagulant. Previous determination of lupus anticoagulant on 06/09/2014 was negative. He continues to do well despite right-sided hemiparesis. Appetite is good with no nausea, vomiting, with rare episodes of epistaxis. He denies any melena, or easy, hematuria, or hemoptysis. He denies any chest pain, PND, orthopnea, palpitations, lower extremity swelling or redness, skin rash, headache, or seizures.    MEDICAL HISTORY: Past Medical History  Diagnosis Date  . Hypertension   . Diabetes mellitus without complication ~3016    last HbA1c ~9  . Hypercholesteremia   . CVA (cerebral infarction) 2011    R hand deficit  . Chronic sinusitis   . Allergic rhinitis   . Chronic cough   . Complication of anesthesia     couldn't swallow and talk  . Stroke   . Splenic infarct     setting of lupus anticoagulant    INTERIM HISTORY: has Hypercholesteremia; Gangrene; Diabetes mellitus out of control; Essential hypertension, benign; Atherosclerotic peripheral vascular disease; Peripheral vascular disease, unspecified; Aftercare following surgery of the circulatory system, NEC; AKI (acute kidney injury); Foot abscess, right; Shock circulatory; Dehydration with hyponatremia; Leukocytosis, unspecified; Sepsis(995.91); NSTEMI (non-ST elevated myocardial infarction); Coronary atherosclerosis of native  coronary artery; Cardiomyopathy, ischemic; Hx of BKA; Acute pulmonary edema; Pneumonia, organism unspecified; Hypoxemia; Acute systolic heart failure; Acute kidney failure; Hypotension, unspecified; Anemia; Hyperkalemia; Hyposmolality and/or hyponatremia; Acute respiratory failure; Mitral regurgitation; Oliguria; Bacteremia due to Staphylococcus aureus; Hyperchloremia; Hx of right BKA; Acute systolic CHF (congestive heart failure); Splenic infarct; and Splenic infarction on his problem list.    ALLERGIES:  is allergic to amoxicillin.  MEDICATIONS: has a current medication list which includes the following prescription(s): alprazolam, aspirin ec, carvedilol, ferrous sulfate, furosemide, hydralazine, insulin aspart, insulin detemir, loratadine-pseudoephedrine, losartan, oxycodone-acetaminophen, pantoprazole, polyethylene glycol, potassium chloride, promethazine, rivaroxaban, senna-docusate, simvastatin, and tamsulosin.  SURGICAL HISTORY:  Past Surgical History  Procedure Laterality Date  . 4th toe amputation Left Jan. 2014    4th toe in [place]  . Tonsillectomy and adenoidectomy    . Penile prosthesis implant    . I&d extremity Left 12/08/2012    Procedure: IRRIGATION AND DEBRIDEMENT EXTREMITY;  Surgeon: Mcarthur Rossetti, MD;  Location: [place];  Service: Orthopedics;  Laterality: Left;  . Amputation Left 12/08/2012    Procedure: AMPUTATION DIGIT;  Surgeon: Mcarthur Rossetti, MD;  Location: [place];  Service: Orthopedics;  Laterality: Left;  3rd toe amputation possible 5th toe amputation  . I&d extremity Left 12/11/2012    Procedure: REPEAT I&D LEFT FOOT;  Surgeon: Mcarthur Rossetti, MD;  Location: [place];  Service: Orthopedics;  Laterality: Left;  . Amputation Left 12/11/2012    Procedure: Amputation of 2nd Toe;  Surgeon: Mcarthur Rossetti, MD;  Location: [place];  Service: Orthopedics;  Laterality: Left;  . Bypass graft popliteal to tibial Left 12/13/2012    Procedure: BYPASS GRAFT  POPLITEAL TO TIBIAL;  Surgeon: Serafina Mitchell, MD;  Location: [facility] OR;  Service: Vascular;  Laterality: Left;  Left Popliteal to Posterior Tibial Bypass Graft with reversed saphenous vein graft  . Incision and drainage Right 06/25/2013    Procedure: INCISION AND DRAINAGE RIGHT FOOT;  Surgeon: Mcarthur Rossetti, MD;  Location: [facility];  Service: Orthopedics;  Laterality: Right;  . Amputation Right 06/29/2013    Procedure: AMPUTATION BELOW KNEE;  Surgeon: Newt Minion, MD;  Location: [place];  Service: Orthopedics;  Laterality: Right;  . Tee without cardioversion [name] 07/03/2013    Procedure: TRANSESOPHAGEAL ECHOCARDIOGRAM (TEE);  Surgeon: Larey Dresser, MD;  Location: [place];  Service: Cardiovascular;  Laterality: [name];  . Stump revision Right 08/16/2013    Procedure: STUMP REVISION- right Below Knee Amputation;  Surgeon: Newt Minion, MD;  Location: [place];  Service: Orthopedics;  Laterality: Right;    FAMILY HISTORY: family history includes Breast cancer in his sister; Depression in his maternal grandfather, maternal grandmother, paternal grandfather, and paternal grandmother; Diabetes in his mother and another family member; Heart disease in his maternal grandfather, maternal grandmother, paternal grandfather, and paternal grandmother; Hyperlipidemia in his mother; Pancreatic cancer in his mother.  SOCIAL HISTORY:  reports that he has never smoked. He has never used smokeless tobacco. He reports that he does not drink alcohol or use illicit drugs.  REVIEW OF SYSTEMS:  Other than that discussed above is noncontributory.  PHYSICAL EXAMINATION: ECOG PERFORMANCE STATUS: 1 - Symptomatic but completely ambulatory  Blood pressure 109/60, pulse 64, temperature 98.4 F (36.9 C), temperature source Oral, resp. rate 18, weight 239 lb (108.41 kg), SpO2 100.00%.  GENERAL:alert, no distress and comfortable SKIN: skin color, texture, turgor are normal, no rashes or significant lesions EYES: PERLA;  Conjunctiva are pink and non-injected, sclera clear SINUSES: No redness or tenderness over maxillary or ethmoid sinuses OROPHARYNX:no exudate, no erythema on lips, buccal mucosa, or tongue. NECK: supple, thyroid normal size, non-tender, without nodularity. No masses CHEST: Normal AP diameter with no breast masses. LYMPH:  no palpable lymphadenopathy in the cervical, axillary or inguinal LUNGS: clear to auscultation and percussion with normal breathing effort HEART: regular rate & rhythm and no murmurs. ABDOMEN:abdomen soft, non-tender and normal bowel sounds. No hepatomegaly, ascites, or CVA tenderness. No left upper quadrant tenderness. No free fluid wave or shifting dullness. MUSCULOSKELETAL:no cyanosis of digits and no clubbing. Range of motion normal. Status post right BKA. NEURO: alert & oriented x 3 with fluent speech, no focal motor/sensory deficits. Right hemiparesis grade 3/5.   LABORATORY DATA: Office Visit on 06/09/2014  Component Date Value Ref Range Status  . Sodium 06/09/2014 137  137 - 147 mEq/L Final  . Potassium 06/09/2014 4.2  3.7 - 5.3 mEq/L Final  . Chloride 06/09/2014 100  96 - 112 mEq/L Final  . CO2 06/09/2014 23  19 - 32 mEq/L Final  . Glucose, Bld 06/09/2014 308* 70 - 99 mg/dL Final  . BUN 06/09/2014 23  6 - 23 mg/dL Final  . Creatinine, Ser 06/09/2014 1.22  0.50 - 1.35 mg/dL Final  . Calcium 06/09/2014 8.8  8.4 - 10.5 mg/dL Final  . GFR calc non Af Amer 06/09/2014 67* >90 mL/min Final  . GFR calc Af Amer 06/09/2014 78* >90 mL/min Final   Comment: (NOTE)                          The eGFR has been calculated using the CKD EPI equation.  This calculation has not been validated in all clinical situations.                          eGFR's persistently <90 mL/min signify possible Chronic Kidney                          Disease.  . Anion gap 06/09/2014 14  5 - 15 Final  Lab on 06/09/2014  Component Date Value Ref Range Status  . WBC  06/09/2014 6.8  4.0 - 10.5 K/uL Final  . RBC 06/09/2014 4.06* 4.22 - 5.81 MIL/uL Final  . Hemoglobin 06/09/2014 11.0* 13.0 - 17.0 g/dL Final  . HCT 06/09/2014 33.2* 39.0 - 52.0 % Final  . MCV 06/09/2014 81.8  78.0 - 100.0 fL Final  . MCH 06/09/2014 27.1  26.0 - 34.0 pg Final  . MCHC 06/09/2014 33.1  30.0 - 36.0 g/dL Final  . RDW 06/09/2014 12.6  11.5 - 15.5 % Final  . Platelets 06/09/2014 192  150 - 400 K/uL Final  . Neutrophils Relative % 06/09/2014 65  43 - 77 % Final  . Neutro Abs 06/09/2014 4.5  1.7 - 7.7 K/uL Final  . Lymphocytes Relative 06/09/2014 21  12 - 46 % Final  . Lymphs Abs 06/09/2014 1.4  0.7 - 4.0 K/uL Final  . Monocytes Relative 06/09/2014 8  3 - 12 % Final  . Monocytes Absolute 06/09/2014 0.6  0.1 - 1.0 K/uL Final  . Eosinophils Relative 06/09/2014 5  0 - 5 % Final  . Eosinophils Absolute 06/09/2014 0.3  0.0 - 0.7 K/uL Final  . Basophils Relative 06/09/2014 1  0 - 1 % Final  . Basophils Absolute 06/09/2014 0.1  0.0 - 0.1 K/uL Final  . D-Dimer, Quant 06/09/2014 0.29  0.00 - 0.48 ug/mL-FEU Final   Comment:                                 AT THE INHOUSE ESTABLISHED CUTOFF                          VALUE OF 0.48 ug/mL FEU,                          THIS ASSAY HAS BEEN DOCUMENTED                          IN THE LITERATURE TO HAVE                          A SENSITIVITY AND NEGATIVE                          PREDICTIVE VALUE OF AT LEAST                          98 TO 99%.  THE TEST RESULT                          SHOULD BE CORRELATED WITH                          AN ASSESSMENT OF THE  CLINICAL                          PROBABILITY OF DVT / VTE.  Marland Kitchen PTT Lupus Anticoagulant 06/09/2014 58.8* 28.0 - 43.0 secs Final  . PTTLA Confirmation 06/09/2014 1.3  <8.0 secs Final  . PTTLA 4:1 Mix 06/09/2014 50.8* 28.0 - 43.0 secs Final  . Drvvt 06/09/2014 69.1* <42.9 secs Final  . Drvvt confirmation 06/09/2014 0.99  <1.15 Ratio Final  . dRVVT Incubated 1:1 Mix 06/09/2014 45.2* <42.9 secs  Final  . Lupus Anticoagulant 06/09/2014 NOT DETECTED  NOT DETECTED Final   Performed at [place]: No new pathology.  Urinalysis    Component Value Date/Time   COLORURINE AMBER* 01/06/2014 1455   APPEARANCEUR CLEAR 01/06/2014 1455   LABSPEC >1.030* 01/06/2014 1455   PHURINE 5.5 01/06/2014 1455   GLUCOSEU NEGATIVE 01/06/2014 1455   HGBUR TRACE* 01/06/2014 1455   BILIRUBINUR NEGATIVE 01/06/2014 1455   KETONESUR NEGATIVE 01/06/2014 1455   PROTEINUR 100* 01/06/2014 1455   UROBILINOGEN 0.2 01/06/2014 1455   NITRITE NEGATIVE 01/06/2014 1455   LEUKOCYTESUR NEGATIVE 01/06/2014 1455    RADIOGRAPHIC STUDIES: Ct Abdomen W Contrast  06/30/2014   CLINICAL DATA:  Diabetes. Hemorrhagic disorder. Lupus anticoagulant.  EXAM: CT ABDOMEN WITH CONTRAST  TECHNIQUE: Multidetector CT imaging of the abdomen was performed using the standard protocol following bolus administration of intravenous contrast.  CONTRAST:  113m OMNIPAQUE IOHEXOL 300 MG/ML  SOLN  COMPARISON:  01/08/2014  FINDINGS: Resolution of previous bilateral pleural effusions. The lung bases appear clear.  No liver abnormality identified. The gallbladder is normal. No biliary dilatation. Normal appearance of the pancreas. There are areas of hypoattenuation and focal volume loss involving the spleen which likely reflect areas of previous splenic infarction. The adrenal glands are both normal. Normal appearance of the kidneys.  Calcified atherosclerotic disease involves the abdominal aorta. No aneurysm. There is no retroperitoneal or small bowel mesenteric adenopathy.  The stomach is normal. The small bowel loops have a normal course and caliber. The appendix is visualized and appears normal. Normal appearance of the visualized portions of the colon.  There is mild spondylosis within the lumbar spine.  IMPRESSION: 1. No acute findings identified within the abdomen. 2. Chronic splenic infarcts. 3. Atherosclerotic disease.   Electronically  Signed   By: TKerby MoorsM.D.   On: 06/30/2014 09:54    ASSESSMENT:  #1. Splenic infarction with normal echocardiogram regarding valvular disorder and no evidence of mesenteric arterial atherosclerosis.  #2. Positive lupus anticoagulant, probably representing an acute phase reactant #3. Ischemic cardiomyopathy with ejection fraction of 40-45% associated with bilateral pleural effusions and small pericardial effusion, compensated.  #4. Peripheral vascular disease, status post right BKA       PLAN:  #1. Discontinue Xarelto after current supply is exhausted. #2. Followup in 3 months with CBC, d-dimer, lupus anticoagulant.   All questions were answered. The patient knows to call the clinic with any problems, questions or concerns. We can certainly see the patient much sooner if necessary.   I spent 25 minutes counseling the patient face to face. The total time spent in the appointment was 30 minutes.    FDoroteo Bradford MD 07/07/2014 11:08 AM  DISCLAIMER:  This note was dictated with voice recognition software.  Similar sounding words can inadvertently be transcribed inaccurately and may not be corrected upon review.

## 2014-07-07 NOTE — Patient Instructions (Signed)
[facility] Discharge Instructions  RECOMMENDATIONS MADE BY THE CONSULTANT AND ANY TEST RESULTS WILL BE SENT TO YOUR REFERRING PHYSICIAN.  We will stop Xarelto after you complete what you have left at home.  Continue aspirin 81 mg daily.  Return in 3 months for office visit and blood work.   Thank you for choosing [facility] to provide your oncology and hematology care.  To afford each patient quality time with our providers, please arrive at least 15 minutes before your scheduled appointment time.  With your help, our goal is to use those 15 minutes to complete the necessary work-up to ensure our physicians have the information they need to help with your evaluation and healthcare recommendations.    Effective January 1st, 2014, we ask that you re-schedule your appointment with our physicians should you arrive 10 or more minutes late for your appointment.  We strive to give you quality time with our providers, and arriving late affects you and other patients whose appointments are after yours.    Again, thank you for choosing [facility].  Our hope is that these requests will decrease the amount of time that you wait before being seen by our physicians.       _____________________________________________________________  Should you have questions after your visit to [facility], please contact our office at [phone] between the hours of 8:30 a.m. and 4:30 p.m.  Voicemails left after 4:30 p.m. will not be returned until the following business day.  For prescription refill requests, have your pharmacy contact our office with your prescription refill request.    _______________________________________________________________  We hope that we have given you very good care.  You may receive a patient satisfaction survey in the mail, please complete it and return it as soon as possible.  We value your  feedback!  _______________________________________________________________  Have you asked about our STAR program?  STAR stands for Survivorship Training and Rehabilitation, and this is a nationally recognized cancer care program that focuses on survivorship and rehabilitation.  Cancer and cancer treatments may cause problems, such as, pain, making you feel tired and keeping you from doing the things that you need or want to do. Cancer rehabilitation can help. Our goal is to reduce these troubling effects and help you have the best quality of life possible.  You may receive a survey from a nurse that asks questions about your current state of health.  Based on the survey results, all eligible patients will be referred to the [facility] program for an evaluation so we can better serve you!  A frequently asked questions sheet is available upon request.

## 2014-07-16 ENCOUNTER — Other Ambulatory Visit: Payer: Self-pay | Admitting: Nurse Practitioner

## 2014-08-14 ENCOUNTER — Other Ambulatory Visit: Payer: Self-pay | Admitting: Nurse Practitioner

## 2014-08-18 ENCOUNTER — Other Ambulatory Visit: Payer: Self-pay

## 2014-08-22 ENCOUNTER — Other Ambulatory Visit (HOSPITAL_COMMUNITY): Payer: Self-pay | Admitting: Orthopedic Surgery

## 2014-08-28 ENCOUNTER — Encounter (HOSPITAL_COMMUNITY)
Admission: RE | Admit: 2014-08-28 | Discharge: 2014-08-28 | Disposition: A | Discharge status: Home / Self Care | Payer: 59 | Source: Ambulatory Visit | Attending: Orthopedic Surgery | Admitting: Orthopedic Surgery

## 2014-08-28 ENCOUNTER — Other Ambulatory Visit (HOSPITAL_COMMUNITY): Payer: 59

## 2014-08-28 ENCOUNTER — Encounter (HOSPITAL_COMMUNITY): Payer: Self-pay

## 2014-08-28 DIAGNOSIS — E780 Pure hypercholesterolemia: Secondary | ICD-10-CM | POA: Diagnosis not present

## 2014-08-28 DIAGNOSIS — Z7982 Long term (current) use of aspirin: Secondary | ICD-10-CM | POA: Diagnosis not present

## 2014-08-28 DIAGNOSIS — E1140 Type 2 diabetes mellitus with diabetic neuropathy, unspecified: Secondary | ICD-10-CM | POA: Diagnosis not present

## 2014-08-28 DIAGNOSIS — I252 Old myocardial infarction: Secondary | ICD-10-CM | POA: Diagnosis not present

## 2014-08-28 DIAGNOSIS — I2510 Atherosclerotic heart disease of native coronary artery without angina pectoris: Secondary | ICD-10-CM | POA: Diagnosis not present

## 2014-08-28 DIAGNOSIS — I739 Peripheral vascular disease, unspecified: Secondary | ICD-10-CM | POA: Diagnosis not present

## 2014-08-28 DIAGNOSIS — Z89429 Acquired absence of other toe(s), unspecified side: Secondary | ICD-10-CM | POA: Diagnosis not present

## 2014-08-28 DIAGNOSIS — D649 Anemia, unspecified: Secondary | ICD-10-CM | POA: Diagnosis not present

## 2014-08-28 DIAGNOSIS — R05 Cough: Secondary | ICD-10-CM | POA: Diagnosis not present

## 2014-08-28 DIAGNOSIS — J329 Chronic sinusitis, unspecified: Secondary | ICD-10-CM | POA: Diagnosis not present

## 2014-08-28 DIAGNOSIS — L97529 Non-pressure chronic ulcer of other part of left foot with unspecified severity: Secondary | ICD-10-CM | POA: Diagnosis not present

## 2014-08-28 DIAGNOSIS — K219 Gastro-esophageal reflux disease without esophagitis: Secondary | ICD-10-CM | POA: Diagnosis not present

## 2014-08-28 DIAGNOSIS — I10 Essential (primary) hypertension: Secondary | ICD-10-CM | POA: Diagnosis not present

## 2014-08-28 DIAGNOSIS — Z881 Allergy status to other antibiotic agents status: Secondary | ICD-10-CM | POA: Diagnosis not present

## 2014-08-28 DIAGNOSIS — I69851 Hemiplegia and hemiparesis following other cerebrovascular disease affecting right dominant side: Secondary | ICD-10-CM | POA: Diagnosis not present

## 2014-08-28 DIAGNOSIS — M868X7 Other osteomyelitis, ankle and foot: Secondary | ICD-10-CM | POA: Diagnosis present

## 2014-08-28 HISTORY — DX: Gastro-esophageal reflux disease without esophagitis: K21.9

## 2014-08-28 HISTORY — DX: Pneumonia, unspecified organism: J18.9

## 2014-08-28 HISTORY — DX: Acute myocardial infarction, unspecified: I21.9

## 2014-08-28 HISTORY — DX: Anemia, unspecified: D64.9

## 2014-08-28 LAB — COMPREHENSIVE METABOLIC PANEL
ALT: 22 U/L (ref 0–53)
ANION GAP: 14 (ref 5–15)
AST: 19 U/L (ref 0–37)
Albumin: 4.1 g/dL (ref 3.5–5.2)
Alkaline Phosphatase: 75 U/L (ref 39–117)
BUN: 29 mg/dL — ABNORMAL HIGH (ref 6–23)
CALCIUM: 9.6 mg/dL (ref 8.4–10.5)
CO2: 25 mEq/L (ref 19–32)
Chloride: 102 mEq/L (ref 96–112)
Creatinine, Ser: 1.19 mg/dL (ref 0.50–1.35)
GFR calc non Af Amer: 69 mL/min — ABNORMAL LOW (ref >90)
GFR, EST AFRICAN AMERICAN: 80 mL/min — AB (ref >90)
Glucose, Bld: 196 mg/dL — ABNORMAL HIGH (ref 70–99)
Potassium: 4.8 mEq/L (ref 3.7–5.3)
Sodium: 141 mEq/L (ref 137–147)
TOTAL PROTEIN: 7.4 g/dL (ref 6.0–8.3)
Total Bilirubin: 0.2 mg/dL — ABNORMAL LOW (ref 0.3–1.2)

## 2014-08-28 LAB — CBC
HCT: 37.8 % — ABNORMAL LOW (ref 39.0–52.0)
Hemoglobin: 12.3 g/dL — ABNORMAL LOW (ref 13.0–17.0)
MCH: 27.2 pg (ref 26.0–34.0)
MCHC: 32.5 g/dL (ref 30.0–36.0)
MCV: 83.6 fL (ref 78.0–100.0)
PLATELETS: 264 10*3/uL (ref 150–400)
RBC: 4.52 MIL/uL (ref 4.22–5.81)
RDW: 13.7 % (ref 11.5–15.5)
WBC: 8.4 10*3/uL (ref 4.0–10.5)

## 2014-08-28 MED ORDER — CLINDAMYCIN PHOSPHATE 900 MG/50ML IV SOLN
900.0000 mg | INTRAVENOUS | Status: AC
  Administered 2014-08-29: 900 mg via INTRAVENOUS
  Filled 2014-08-28: qty 50

## 2014-08-28 NOTE — Progress Notes (Signed)
Primary - dr. Thedore Mins hall Cardiologist - dr. Martinique Was supposed to see Albert gerhardt np tomorrow but had to cancel due to surgery.

## 2014-08-28 NOTE — Progress Notes (Signed)
Anesthesia Chart Review:  Patient is a 53 year old male scheduled for a left great toe amputation at [facility] tomorrow (first case) by Dr. Sharol Given.   History includes non-smoker, CAD/NSTEMI 06/2013 (not felt to be a candidate for CABG at that time; continued medical management recommended as of 04/30/14), HTN, DM2, PAD s/p FPBG hypercholesterolemia, CVA '11, splenic infarct (Xarelto discontinued following 07/07/14 hematology evaluation Dr. 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 Cardiologist is Dr. Martinique, last visit with Truitt Merle, NP on 04/30/14.  He was suppose to see her again tomorrow but canceled the appointment due to his surgery.    He was seen by CT surgeon Dr. Roxan Hockey on 09/03/13.  He wrote: Impression:  53 year old gentleman with multiple severe medical problems including poorly controlled, complicated diabetes, peripheral vascular disease, and previous stroke. During a hospitalization for sepsis due to gangrene of his right foot he had a non-Q-wave MI. Cardiac catheterization revealed a left dominant circulation with three-vessel disease equivalent. Echocardiogram was reported his ejection fraction in the 40-50% range. He also has moderate mitral regurgitation.  The ideal treatment for his cardiac disease would be coronary bypass grafting, with possible mitral valve annuloplasty depending on intraoperative findings. This would be an extremely high-risk endeavor in this individual, and I do not think he is a candidate for that surgery currently. He only recently has had his below-knee amputation stump revised and has not completely healed. He remains severely debilitated.  Should his overall condition improved dramatically, we could consider coronary bypass grafting. However even then it would be a difficult situation given the limitations of available conduit. He is not a candidate for bilateral mammaries due to his  severe diabetes. He has severe edema in both lower extremities and I suspect his saphenous vein will be marginal at best. He has had peripheral bypass below the knee on the left side. Wound healing likely will be problematic at best. He has an equivocal Allen's test on the left hand and given his right arm weakness, the left hand is his dominant. Pulse in the right arm is weak.  Overall I think his best option at this point in time his medical therapy. We will refer him back to his cardiologist. I will plan to see him back after the first of the year to see if he has made sufficient progress that surgery is a consideration.   EKG on 01/06/14: NSR, low voltage QRS, non-specific ST abnormality in lateral leads.    Echo on 01/08/14:  - Procedure narrative: Transthoracic echocardiography. Image quality was suboptimal. The study was technically difficult, as a result of poor sound wave transmission. - Left ventricle: The cavity size was normal. Systolic function was difficult to assess due to poor endocardial visualization, but appeared to bemildly to moderately reduced. The estimated ejection fraction was in the range of 40% to 45%. Features are consistent with a pseudonormal left ventricular filling pattern, with concomitant abnormal relaxation and increased filling pressure (grade 2 diastolic dysfunction). Doppler parameters are consistent with both elevated ventricular end-diastolic filling pressure and elevated left atrial filling pressure. Severe asymmetric basal septal hypertrophy (1.77 cm). - Mitral valve: Moderately thickened leaflets . No mitral stenosis noted. Moderate regurgitation. - Left atrium: The atrium was moderately dilated. - Right ventricle: The cavity size was moderately dilated. Systolic function was mildly to moderately reduced. - Right atrium: The atrium was moderately dilated. Central venous pressure: 26mm Hg (est). - Atrial  septum: No  defect or patent foramen ovale was identified. - Tricuspid valve: Mildly thickened leaflets. Moderate regurgitation. - Pulmonic valve: Mildly thickened leaflets. Trivial regurgitation. - Pulmonary arteries: PA peak pressure: 63mm Hg (S). Moderate to severely elevated pulmonary pressures. - Pericardium, extracardiac: A small pericardial effusion was present (measuring 1.2 cm in maximal dimension), with no evidence of tamponade physiology. There was a left pleural effusion.  Angiographic Findings: 06/28/13 Left main: Short segment without obstruction.  Left Anterior Descending Artery: Moderate caliber diffusely diseased vessel that courses to the apex. The proximal vessel has diffuse 40% stenosis. The mid vessel has a 70% stenosis followed by a 60% stenosis. The distal vessel has diffuse 90% stenosis. The diagonal is a moderate caliber vessel with ostial 99% stenosis with 99% disease extending into the mid vessel.  Circumflex Artery: Large caliber vessel with proximal 80-90% stenosis. There are three small to moderate caliber obtuse marginal branches. The first OM has mid 80% stenosis. The second OM has a proximal 99% stenosis. The third OM has diffuse 99% stenosis. The left sided posterolateral branch has 99% stenosis.  Right Coronary Artery: Small non-dominant vessel with mid 99% stenosis.  Left Ventricular Angiogram: LVEF=40%  Impression:  1. Triple vessel CAD  2. NSTEMI secondary to demand ischemia from sepsis with severe underlying CAD  3. Mild LV systolic dysfunction  Recommendations: Complex situation. The only option for coronary revascularization is CABG. He is not a favorable candidate for CABG at this time with necrotic right foot, sepsis. Even though not ideal, would favor medical management of CAD while undergoing treatment of his necrotic foot. Would proceed with right BKA this weekend. I have discussed the case with CT surgery who agrees that CABG is not a  good option at this time. Will ask CT surgery to see after he recovers from his BKA. Continue ASA, beta blocker, statin.   Carotid duplex 12/11/12: No significant extracranial carotid artery stenosisdemonstrated. Vertebrals are patent with antegrade flow.  1V CXR: Interstitial infiltrates likely reflecting pulmonary edema. Blunted costophrenic angles consistent with small effusions left greater than right. Stable cardiomegaly.  PFTs 07/03/13: FVC 1.99 (38%), FEV1 1.40 (34%),. Severe airflow limitation. Cannot exclude coexisting restrictive disease. Consider ordering lung volumes if clinicially indicated. Significant improvement in airflows after bronchodilator.     Preoperative labs noted.  Patient denied any new CV symptoms since his last cardiology visit.  Continued medical therapy for his CAD recommended at that time. Reviewed above with anesthesiologist Dr. Therisa Doyne.  If no acute changes would anticipate he could proceed, but would anticipate need for ankle block instead of GA.  George Hugh [facility] Short Stay Center/Anesthesiology Phone [phone] 08/28/2014 3:33 PM

## 2014-08-28 NOTE — Pre-Procedure Instructions (Signed)
Weston OSWALD POTT  08/28/2014   Your procedure is scheduled on:  Friday, November 13th  Report to [facility] Admitting at 530 AM.  Call this number if you have problems the morning of surgery: [phone]   Remember:   Do not eat food or drink liquids after midnight.   Take these medicines the morning of surgery with A SIP OF WATER: coreg, hydralazine, protonix, oxycodone if needed, xanax if needed   Do not wear jewelry.  Do not wear lotions, powders, or perfumes. You may wear deodorant.  Do not shave 48 hours prior to surgery. Men may shave face and neck.  Do not bring valuables to the hospital.  [facility] is not responsible  for any belongings or valuables.               Contacts, dentures or bridgework may not be worn into surgery.  Leave suitcase in the car. After surgery it may be brought to your room.  For patients admitted to the hospital, discharge time is determined by your  treatment team.               Patients discharged the day of surgery will not be allowed to drive home.  Please read over the following fact sheets that you were given: Pain Booklet, Coughing and Deep Breathing and Surgical Site Infection Prevention  [place] - Preparing for Surgery  Before surgery, you can play an important role.  Because skin is not sterile, your skin needs to be as free of germs as possible.  You can reduce the number of germs on you skin by washing with CHG (chlorahexidine gluconate) soap before surgery.  CHG is an antiseptic cleaner which kills germs and bonds with the skin to continue killing germs even after washing.  Please DO NOT use if you have an allergy to CHG or antibacterial soaps.  If your skin becomes reddened/irritated stop using the CHG and inform your nurse when you arrive at Short Stay.  Do not shave (including legs and underarms) for at least 48 hours prior to the first CHG shower.  You may shave your face.  Please follow these instructions  carefully:   1.  Shower with CHG Soap the night before surgery and the morning of Surgery.  2.  If you choose to wash your hair, wash your hair first as usual with your normal shampoo.  3.  After you shampoo, rinse your hair and body thoroughly to remove the shampoo.  4.  Use CHG as you would any other liquid soap.  You can apply CHG directly to the skin and wash gently with scrungie or a clean washcloth.  5.  Apply the CHG Soap to your body ONLY FROM THE NECK DOWN.  Do not use on open wounds or open sores.  Avoid contact with your eyes, ears, mouth and genitals (private parts).  Wash genitals (private parts) with your normal soap.  6.  Wash thoroughly, paying special attention to the area where your surgery will be performed.  7.  Thoroughly rinse your body with warm water from the neck down.  8.  DO NOT shower/wash with your normal soap after using and rinsing off the CHG Soap.  9.  Pat yourself dry with a clean towel.            10.  Wear clean pajamas.            11.  Place clean sheets on your bed the  night of your first shower and do not sleep with pets.  Day of Surgery  Do not apply any lotions/deoderants the morning of surgery.  Please wear clean clothes to the hospital/surgery center.

## 2014-08-29 ENCOUNTER — Encounter (HOSPITAL_COMMUNITY): Payer: Self-pay | Admitting: Certified Registered"

## 2014-08-29 ENCOUNTER — Encounter (HOSPITAL_COMMUNITY)
Admission: RE | Disposition: A | Discharge status: Home / Self Care | Payer: Self-pay | Source: Ambulatory Visit | Attending: Orthopedic Surgery

## 2014-08-29 ENCOUNTER — Ambulatory Visit: Payer: 59 | Admitting: Nurse Practitioner

## 2014-08-29 ENCOUNTER — Ambulatory Visit (HOSPITAL_COMMUNITY): Payer: 59 | Admitting: Certified Registered"

## 2014-08-29 ENCOUNTER — Ambulatory Visit (HOSPITAL_COMMUNITY): Payer: 59 | Admitting: Vascular Surgery

## 2014-08-29 ENCOUNTER — Ambulatory Visit (HOSPITAL_COMMUNITY)
Admission: RE | Admit: 2014-08-29 | Discharge: 2014-08-29 | Disposition: A | Discharge status: Home / Self Care | Payer: 59 | Source: Ambulatory Visit | Attending: Orthopedic Surgery | Admitting: Orthopedic Surgery

## 2014-08-29 DIAGNOSIS — M868X7 Other osteomyelitis, ankle and foot: Secondary | ICD-10-CM | POA: Diagnosis not present

## 2014-08-29 DIAGNOSIS — I10 Essential (primary) hypertension: Secondary | ICD-10-CM | POA: Insufficient documentation

## 2014-08-29 DIAGNOSIS — Z7982 Long term (current) use of aspirin: Secondary | ICD-10-CM | POA: Insufficient documentation

## 2014-08-29 DIAGNOSIS — M86172 Other acute osteomyelitis, left ankle and foot: Secondary | ICD-10-CM

## 2014-08-29 DIAGNOSIS — I252 Old myocardial infarction: Secondary | ICD-10-CM | POA: Insufficient documentation

## 2014-08-29 DIAGNOSIS — I739 Peripheral vascular disease, unspecified: Secondary | ICD-10-CM | POA: Insufficient documentation

## 2014-08-29 DIAGNOSIS — Z89429 Acquired absence of other toe(s), unspecified side: Secondary | ICD-10-CM | POA: Insufficient documentation

## 2014-08-29 DIAGNOSIS — K219 Gastro-esophageal reflux disease without esophagitis: Secondary | ICD-10-CM | POA: Insufficient documentation

## 2014-08-29 DIAGNOSIS — E1140 Type 2 diabetes mellitus with diabetic neuropathy, unspecified: Secondary | ICD-10-CM | POA: Insufficient documentation

## 2014-08-29 DIAGNOSIS — I69851 Hemiplegia and hemiparesis following other cerebrovascular disease affecting right dominant side: Secondary | ICD-10-CM | POA: Insufficient documentation

## 2014-08-29 DIAGNOSIS — D649 Anemia, unspecified: Secondary | ICD-10-CM | POA: Insufficient documentation

## 2014-08-29 DIAGNOSIS — E780 Pure hypercholesterolemia: Secondary | ICD-10-CM | POA: Insufficient documentation

## 2014-08-29 DIAGNOSIS — Z881 Allergy status to other antibiotic agents status: Secondary | ICD-10-CM | POA: Insufficient documentation

## 2014-08-29 DIAGNOSIS — R05 Cough: Secondary | ICD-10-CM | POA: Insufficient documentation

## 2014-08-29 DIAGNOSIS — J329 Chronic sinusitis, unspecified: Secondary | ICD-10-CM | POA: Insufficient documentation

## 2014-08-29 DIAGNOSIS — L97529 Non-pressure chronic ulcer of other part of left foot with unspecified severity: Secondary | ICD-10-CM | POA: Insufficient documentation

## 2014-08-29 DIAGNOSIS — I2510 Atherosclerotic heart disease of native coronary artery without angina pectoris: Secondary | ICD-10-CM | POA: Insufficient documentation

## 2014-08-29 HISTORY — PX: AMPUTATION: SHX166

## 2014-08-29 LAB — GLUCOSE, CAPILLARY
GLUCOSE-CAPILLARY: 162 mg/dL — AB (ref 70–99)
Glucose-Capillary: 160 mg/dL — ABNORMAL HIGH (ref 70–99)

## 2014-08-29 LAB — PROTIME-INR
INR: 1.06 (ref 0.00–1.49)
PROTHROMBIN TIME: 14 s (ref 11.6–15.2)

## 2014-08-29 LAB — APTT: aPTT: 31 seconds (ref 24–37)

## 2014-08-29 SURGERY — AMPUTATION DIGIT
Anesthesia: Regional | Site: Foot | Laterality: Left

## 2014-08-29 MED ORDER — FENTANYL CITRATE 0.05 MG/ML IJ SOLN
INTRAMUSCULAR | Status: DC | PRN
  Administered 2014-08-29: 50 ug via INTRAVENOUS

## 2014-08-29 MED ORDER — SUCCINYLCHOLINE CHLORIDE 20 MG/ML IJ SOLN
INTRAMUSCULAR | Status: AC
  Filled 2014-08-29: qty 1

## 2014-08-29 MED ORDER — PROPOFOL 10 MG/ML IV BOLUS
INTRAVENOUS | Status: DC | PRN
  Administered 2014-08-29: 30 mg via INTRAVENOUS

## 2014-08-29 MED ORDER — LIDOCAINE-EPINEPHRINE (PF) 1.5 %-1:200000 IJ SOLN
INTRAMUSCULAR | Status: DC | PRN
  Administered 2014-08-29: 10 mL via PERINEURAL

## 2014-08-29 MED ORDER — LACTATED RINGERS IV SOLN
INTRAVENOUS | Status: DC | PRN
  Administered 2014-08-29: 07:00:00 via INTRAVENOUS

## 2014-08-29 MED ORDER — ONDANSETRON HCL 4 MG/2ML IJ SOLN
INTRAMUSCULAR | Status: AC
  Filled 2014-08-29: qty 2

## 2014-08-29 MED ORDER — ARTIFICIAL TEARS OP OINT
TOPICAL_OINTMENT | OPHTHALMIC | Status: AC
  Filled 2014-08-29: qty 3.5

## 2014-08-29 MED ORDER — MIDAZOLAM HCL 5 MG/5ML IJ SOLN
INTRAMUSCULAR | Status: DC | PRN
  Administered 2014-08-29: 2 mg via INTRAVENOUS

## 2014-08-29 MED ORDER — LIDOCAINE HCL (CARDIAC) 20 MG/ML IV SOLN
INTRAVENOUS | Status: DC | PRN
  Administered 2014-08-29: 40 mg via INTRAVENOUS

## 2014-08-29 MED ORDER — OXYCODONE HCL 5 MG PO TABS: 5.0000 mg | ORAL_TABLET | Freq: Once | ORAL | Status: DC | PRN

## 2014-08-29 MED ORDER — PROPOFOL 10 MG/ML IV BOLUS
INTRAVENOUS | Status: AC
  Filled 2014-08-29: qty 20

## 2014-08-29 MED ORDER — OXYCODONE HCL 5 MG/5ML PO SOLN: 5.0000 mg | Freq: Once | ORAL | Status: DC | PRN

## 2014-08-29 MED ORDER — HYDROCODONE-ACETAMINOPHEN 5-325 MG PO TABS: 1.0000 | ORAL_TABLET | Freq: Four times a day (QID) | ORAL | Status: DC | PRN

## 2014-08-29 MED ORDER — LIDOCAINE HCL (CARDIAC) 20 MG/ML IV SOLN
INTRAVENOUS | Status: AC
  Filled 2014-08-29: qty 5

## 2014-08-29 MED ORDER — FENTANYL CITRATE 0.05 MG/ML IJ SOLN
INTRAMUSCULAR | Status: AC
  Filled 2014-08-29: qty 5

## 2014-08-29 MED ORDER — MIDAZOLAM HCL 2 MG/2ML IJ SOLN
INTRAMUSCULAR | Status: AC
  Filled 2014-08-29: qty 2

## 2014-08-29 MED ORDER — HYDROMORPHONE HCL 1 MG/ML IJ SOLN: 0.2500 mg | INTRAMUSCULAR | Status: DC | PRN

## 2014-08-29 MED ORDER — ROPIVACAINE HCL 5 MG/ML IJ SOLN
INTRAMUSCULAR | Status: DC | PRN
  Administered 2014-08-29: 20 mL via PERINEURAL

## 2014-08-29 SURGICAL SUPPLY — 30 items
BNDG COHESIVE 4X5 TAN STRL (GAUZE/BANDAGES/DRESSINGS) ×3 IMPLANT
BNDG ESMARK 4X9 LF (GAUZE/BANDAGES/DRESSINGS) ×3 IMPLANT
BNDG GAUZE ELAST 4 BULKY (GAUZE/BANDAGES/DRESSINGS) ×3 IMPLANT
COVER SURGICAL LIGHT HANDLE (MISCELLANEOUS) ×3 IMPLANT
DRAPE U-SHAPE 47X51 STRL (DRAPES) ×3 IMPLANT
DRSG ADAPTIC 3X8 NADH LF (GAUZE/BANDAGES/DRESSINGS) ×3 IMPLANT
DRSG PAD ABDOMINAL 8X10 ST (GAUZE/BANDAGES/DRESSINGS) ×3 IMPLANT
DURAPREP 26ML APPLICATOR (WOUND CARE) ×3 IMPLANT
ELECT REM PT RETURN 9FT ADLT (ELECTROSURGICAL) ×3
ELECTRODE REM PT RTRN 9FT ADLT (ELECTROSURGICAL) ×1 IMPLANT
GAUZE SPONGE 4X4 12PLY STRL (GAUZE/BANDAGES/DRESSINGS) ×3 IMPLANT
GLOVE BIOGEL PI IND STRL 7.0 (GLOVE) ×1 IMPLANT
GLOVE BIOGEL PI IND STRL 9 (GLOVE) ×2 IMPLANT
GLOVE BIOGEL PI INDICATOR 7.0 (GLOVE) ×2
GLOVE BIOGEL PI INDICATOR 9 (GLOVE) ×4
GLOVE SKINSENSE NS SZ8.0 LF (GLOVE) ×2
GLOVE SKINSENSE STRL SZ8.0 LF (GLOVE) ×1 IMPLANT
GLOVE SURG ORTHO 9.0 STRL STRW (GLOVE) ×6 IMPLANT
GOWN STRL REUS W/ TWL XL LVL3 (GOWN DISPOSABLE) ×2 IMPLANT
GOWN STRL REUS W/TWL XL LVL3 (GOWN DISPOSABLE) ×4
KIT BASIN OR (CUSTOM PROCEDURE TRAY) ×3 IMPLANT
KIT ROOM TURNOVER OR (KITS) ×3 IMPLANT
MANIFOLD NEPTUNE II (INSTRUMENTS) ×3 IMPLANT
NS IRRIG 1000ML POUR BTL (IV SOLUTION) ×3 IMPLANT
PACK ORTHO EXTREMITY (CUSTOM PROCEDURE TRAY) ×3 IMPLANT
PAD ARMBOARD 7.5X6 YLW CONV (MISCELLANEOUS) ×6 IMPLANT
SUCTION FRAZIER TIP 10 FR DISP (SUCTIONS) IMPLANT
SUT ETHILON 2 0 PSLX (SUTURE) ×3 IMPLANT
TOWEL OR 17X24 6PK STRL BLUE (TOWEL DISPOSABLE) ×3 IMPLANT
TOWEL OR 17X26 10 PK STRL BLUE (TOWEL DISPOSABLE) ×3 IMPLANT

## 2014-08-29 NOTE — Op Note (Signed)
08/29/2014  7:34 AM  PATIENT:  Albert [name]    PRE-OPERATIVE DIAGNOSIS:  Osteomyelitis and Ulcer Left Great Toe  POST-OPERATIVE DIAGNOSIS:  Same  PROCEDURE:  Left Great Toe Amputation at MTP  SURGEON:  Newt Minion, MD  PHYSICIAN ASSISTANT:None ANESTHESIA:   General  PREOPERATIVE INDICATIONS:  Albert [name] is a  53 y.o. male with a diagnosis of Osteomyelitis and Ulcer Left Great Toe who failed conservative measures and elected for surgical management.    The risks benefits and alternatives were discussed with the patient preoperatively including but not limited to the risks of infection, bleeding, nerve injury, cardiopulmonary complications, the need for revision surgery, among others, and the patient was willing to proceed.  OPERATIVE IMPLANTS: none  OPERATIVE FINDINGS: good petechial bleeding  OPERATIVE PROCEDURE: patient was brought to the operating room and underwent a popliteal block. After adequate levels of anesthesia obtained patient's left lower extremity was prepped using DuraPrep draped into a sterile field. A fishmouth incision was made just distal to the MTP joint. The toe was amputated through the MTP joint. The wound was irrigated with normal saline electrocautery was used for hemostasis. The incision was closed using 2-0 nylon. A sterile compressive dressing was applied. Patient was taken to the PACU in stable condition.

## 2014-08-29 NOTE — Transfer of Care (Signed)
Immediate Anesthesia Transfer of Care Note  Patient: Albert [name]  Procedure(s) Performed: Procedure(s): Left Great Toe Amputation at MTP (Left)  Patient Location: PACU  Anesthesia Type:Regional  Level of Consciousness: awake, alert  and oriented  Airway & Oxygen Therapy: Patient Spontanous Breathing  Post-op Assessment: Report given to PACU RN  Post vital signs: Reviewed and stable  Complications: No apparent anesthesia complications

## 2014-08-29 NOTE — Anesthesia Preprocedure Evaluation (Addendum)
Anesthesia Evaluation  Patient identified by MRN, date of birth, ID band Patient awake    Reviewed: Allergy & Precautions, H&P , NPO status , Patient's Chart, lab work & pertinent test results  History of Anesthesia Complications Negative for: history of anesthetic complications  Airway Mallampati: II  TM Distance: <3 FB Neck ROM: Full    Dental  (+) Teeth Intact, Dental Advisory Given   Pulmonary neg pulmonary ROS,  breath sounds clear to auscultation        Cardiovascular hypertension, Pt. on medications - angina+ CAD, + Past MI, + Peripheral Vascular Disease and +CHF Rhythm:Regular     Neuro/Psych CVA, Residual Symptoms negative psych ROS   GI/Hepatic Neg liver ROS, GERD-  Medicated and Controlled,  Endo/Other  diabetes, Type 2, Insulin Dependent  Renal/GU Renal InsufficiencyRenal disease     Musculoskeletal   Abdominal   Peds  Hematology   Anesthesia Other Findings   Reproductive/Obstetrics                         Anesthesia Physical Anesthesia Plan  ASA: IV  Anesthesia Plan: Regional   Post-op Pain Management: MAC Combined w/ Regional for Post-op pain   Induction:   Airway Management Planned: Natural Airway and Simple Face Mask  Additional Equipment: None  Intra-op Plan:   Post-operative Plan:   Informed Consent: I have reviewed the patients History and Physical, chart, labs and discussed the procedure including the risks, benefits and alternatives for the proposed anesthesia with the patient or authorized representative who has indicated his/her understanding and acceptance.   Dental advisory given  Plan Discussed with: CRNA and Surgeon  Anesthesia Plan Comments:        Anesthesia Quick Evaluation

## 2014-08-29 NOTE — Discharge Instructions (Signed)
Keep dressing clean dry and intact. °Minimize weightbearing left foot. °

## 2014-08-29 NOTE — Anesthesia Procedure Notes (Addendum)
Procedure Name: MAC Date/Time: 08/29/2014 7:23 AM Performed by: Barrington Ellison Pre-anesthesia Checklist: Emergency Drugs available, Patient identified, Suction available and Patient being monitored Patient Re-evaluated:Patient Re-evaluated prior to inductionOxygen Delivery Method: Nasal cannula    Anesthesia Regional Block:  Popliteal block  Pre-Anesthetic Checklist: ,, timeout performed, Correct Patient, Correct Site, Correct Laterality, Correct Procedure, Correct Position, site marked, Risks and benefits discussed,  Surgical consent,  Pre-op evaluation,  At surgeon's request and post-op pain management  Laterality: Lower and Left  Prep: chloraprep       Needles:  Injection technique: Single-shot  Needle Type: Echogenic Stimulator Needle          Additional Needles:  Procedures: ultrasound guided (picture in chart) and nerve stimulator Popliteal block  Nerve Stimulator or Paresthesia:  Response: planatrflexion, 0.4 mA,   Additional Responses:   Narrative:  Injection made incrementally with aspirations every 5 mL.  Performed by: Personally   Additional Notes: H+P and labs reviewed, risks and benefits discussed with patient, procedure tolerated well without complications

## 2014-08-29 NOTE — Anesthesia Postprocedure Evaluation (Signed)
  Anesthesia Post-op Note  Patient: Albert [name]  Procedure(s) Performed: Procedure(s): Left Great Toe Amputation at MTP (Left)  Patient Location: PACU  Anesthesia Type:MAC and Regional  Level of Consciousness: awake and alert   Airway and Oxygen Therapy: Patient Spontanous Breathing  Post-op Pain: none  Post-op Assessment: Post-op Vital signs reviewed, Patient's Cardiovascular Status Stable, Respiratory Function Stable, Patent Airway, No signs of Nausea or vomiting and Pain level controlled  Post-op Vital Signs: Reviewed and stable  Last Vitals:  Filed Vitals:   08/29/14 0929  BP:   Pulse: 60  Temp:   Resp:     Complications: No apparent anesthesia complications

## 2014-08-29 NOTE — Progress Notes (Signed)
Pt in [facility] waiting for transportation home

## 2014-08-29 NOTE — H&P (Signed)
Albert [name] is an 53 y.o. male.   Chief Complaint: osteomyelitis ulceration left great toe HPI: patient is a 53 year old gentleman diabetic insensate neuropathy who has failed conservative wound care for left foot great toe  Past Medical History  Diagnosis Date  . Hypertension   . Hypercholesteremia   . CVA (cerebral infarction) 2011    R hand deficit  . Chronic sinusitis   . Allergic rhinitis   . Chronic cough   . Complication of anesthesia     couldn't swallow and talk  . Splenic infarct     setting of lupus anticoagulant  . Myocardial infarction   . Stroke     right arm weakness  . Pneumonia     hx of walking  . Diabetes mellitus without complication ~4782    last HbA1c ~9 .... fasting 160s  . GERD (gastroesophageal reflux disease)   . Anemia     takes supplement    Past Surgical History  Procedure Laterality Date  . 4th toe amputation Left Jan. 2014    4th toe in [place]  . Tonsillectomy and adenoidectomy    . Penile prosthesis implant    . I&d extremity Left 12/08/2012    Procedure: IRRIGATION AND DEBRIDEMENT EXTREMITY;  Surgeon: Mcarthur Rossetti, MD;  Location: [place];  Service: Orthopedics;  Laterality: Left;  . Amputation Left 12/08/2012    Procedure: AMPUTATION DIGIT;  Surgeon: Mcarthur Rossetti, MD;  Location: [place];  Service: Orthopedics;  Laterality: Left;  3rd toe amputation possible 5th toe amputation  . I&d extremity Left 12/11/2012    Procedure: REPEAT I&D LEFT FOOT;  Surgeon: Mcarthur Rossetti, MD;  Location: [place];  Service: Orthopedics;  Laterality: Left;  . Amputation Left 12/11/2012    Procedure: Amputation of 2nd Toe;  Surgeon: Mcarthur Rossetti, MD;  Location: [place];  Service: Orthopedics;  Laterality: Left;  . Bypass graft popliteal to tibial Left 12/13/2012    Procedure: BYPASS GRAFT POPLITEAL TO TIBIAL;  Surgeon: Serafina Mitchell, MD;  Location: [facility];  Service: Vascular;  Laterality: Left;  Left Popliteal to Posterior Tibial Bypass  Graft with reversed saphenous vein graft  . Incision and drainage Right 06/25/2013    Procedure: INCISION AND DRAINAGE RIGHT FOOT;  Surgeon: Mcarthur Rossetti, MD;  Location: [facility];  Service: Orthopedics;  Laterality: Right;  . Amputation Right 06/29/2013    Procedure: AMPUTATION BELOW KNEE;  Surgeon: Newt Minion, MD;  Location: [place];  Service: Orthopedics;  Laterality: Right;  . Tee without cardioversion [name] 07/03/2013    Procedure: TRANSESOPHAGEAL ECHOCARDIOGRAM (TEE);  Surgeon: Larey Dresser, MD;  Location: [place];  Service: Cardiovascular;  Laterality: [name];  . Stump revision Right 08/16/2013    Procedure: STUMP REVISION- right Below Knee Amputation;  Surgeon: Newt Minion, MD;  Location: [place];  Service: Orthopedics;  Laterality: Right;  . Cardiac catheterization      Family History  Problem Relation Age of Onset  . Diabetes    . Pancreatic cancer Mother   . Diabetes Mother   . Hyperlipidemia Mother   . Breast cancer Sister   . Depression Maternal Grandmother   . Heart disease Maternal Grandmother   . Depression Maternal Grandfather   . Heart disease Maternal Grandfather   . Depression Paternal Grandmother   . Heart disease Paternal Grandmother   . Depression Paternal Grandfather   . Heart disease Paternal Grandfather    Social History:  reports that he has never  smoked. He has never used smokeless tobacco. He reports that he does not drink alcohol or use illicit drugs.  Allergies:  Allergies  Allergen Reactions  . Amoxicillin Nausea And Vomiting    Medications Prior to Admission  Medication Sig Dispense Refill  . aspirin EC 81 MG tablet Take 1 tablet (81 mg total) by mouth daily. 30 tablet 1  . carvedilol (COREG) 25 MG tablet TAKE 1 AND 1/2 TABLETS(37.5MG) BY MOUTH TWO TIMES DAILY WITH A MEAL 90 tablet 1  . ferrous sulfate 325 (65 FE) MG EC tablet Take 325 mg by mouth daily with breakfast.    . furosemide (LASIX) 80 MG tablet Take 80 mg by mouth daily.     . hydrALAZINE (APRESOLINE) 10 MG tablet Take 1 tablet (10 mg total) by mouth 3 (three) times daily. 120 tablet 1  . insulin aspart (NOVOLOG) 100 UNIT/ML injection Inject 12 Units into the skin 3 (three) times daily before meals.     . insulin detemir (LEVEMIR) 100 UNIT/ML injection Inject 30 Units into the skin at bedtime.     Marland Kitchen KLOR-CON 10 10 MEQ tablet TAKE 1 TABLET BY MOUTH TWICE A DAY (Patient taking differently: TAKE 1 TABLET BY MOUTH daily) 60 tablet 3  . Loratadine-Pseudoephedrine (CLARITIN-D 12 HOUR PO) Take 1 tablet by mouth as needed (allergies).     . losartan (COZAAR) 25 MG tablet Take 1 tablet (25 mg total) by mouth daily. 30 tablet 3  . oxyCODONE-acetaminophen (ROXICET) 5-325 MG per tablet Take 1-2 tablets by mouth every 4 (four) hours as needed for severe pain. 30 tablet 0  . pantoprazole (PROTONIX) 40 MG tablet Take 1 tablet (40 mg total) by mouth daily. 30 tablet 1  . polyethylene glycol (MIRALAX / GLYCOLAX) packet Take 17 g by mouth daily as needed for mild constipation.     . senna-docusate (SENOKOT-S) 8.6-50 MG per tablet Take 1 tablet by mouth daily.     . simvastatin (ZOCOR) 40 MG tablet Take 1 tablet (40 mg total) by mouth every evening. 30 tablet 1  . tamsulosin (FLOMAX) 0.4 MG CAPS capsule Take 1 capsule (0.4 mg total) by mouth daily after supper. 30 capsule 1  . ALPRAZolam (XANAX) 0.25 MG tablet Take 1 tablet (0.25 mg total) by mouth 2 (two) times daily as needed for anxiety. 30 tablet 0  . promethazine (PHENERGAN) 25 MG tablet Take 1 tab every 6-8 hours for nausea. (Patient taking differently: Take 25 mg by mouth every 6 (six) hours as needed for nausea. Take 1 tab every 6-8 hours for nausea.) 40 tablet 1  . Rivaroxaban (XARELTO) 20 MG TABS tablet Take 1 tablet (20 mg total) by mouth daily with supper. To begin once 41m tablets are complete 30 tablet 2    Results for orders placed or performed during the hospital encounter of 08/28/14 (from the past 48 hour(s))  CBC      Status: Abnormal   Collection Time: 08/28/14  2:21 PM  Result Value Ref Range   WBC 8.4 4.0 - 10.5 K/uL   RBC 4.52 4.22 - 5.81 MIL/uL   Hemoglobin 12.3 (L) 13.0 - 17.0 g/dL   HCT 37.8 (L) 39.0 - 52.0 %   MCV 83.6 78.0 - 100.0 fL   MCH 27.2 26.0 - 34.0 pg   MCHC 32.5 30.0 - 36.0 g/dL   RDW 13.7 11.5 - 15.5 %   Platelets 264 150 - 400 K/uL  Comprehensive metabolic panel     Status: Abnormal  Collection Time: 08/28/14  2:21 PM  Result Value Ref Range   Sodium 141 137 - 147 mEq/L   Potassium 4.8 3.7 - 5.3 mEq/L   Chloride 102 96 - 112 mEq/L   CO2 25 19 - 32 mEq/L   Glucose, Bld 196 (H) 70 - 99 mg/dL   BUN 29 (H) 6 - 23 mg/dL   Creatinine, Ser 1.19 0.50 - 1.35 mg/dL   Calcium 9.6 8.4 - 10.5 mg/dL   Total Protein 7.4 6.0 - 8.3 g/dL   Albumin 4.1 3.5 - 5.2 g/dL   AST 19 0 - 37 U/L   ALT 22 0 - 53 U/L   Alkaline Phosphatase 75 39 - 117 U/L   Total Bilirubin 0.2 (L) 0.3 - 1.2 mg/dL   GFR calc non Af Amer 69 (L) >90 mL/min   GFR calc Af Amer 80 (L) >90 mL/min    Comment: (NOTE) The eGFR has been calculated using the CKD EPI equation. This calculation has not been validated in all clinical situations. eGFR's persistently <90 mL/min signify possible Chronic Kidney Disease.    Anion gap 14 5 - 15   No results found.  Review of Systems  All other systems reviewed and are negative.   Blood pressure 109/79, pulse 59, temperature 97.8 F (36.6 C), SpO2 100 %. Physical Exam  On examination patient has ulceration with osteomyelitis left great toe Assessment/Plan Assessment: Osteomyelitis ulceration left great toe.  Plan: We'll plan for amputation of the left great toe at the MTP joint. Risk and benefits were discussed patient states he understands and wished to proceed at this time.  DUDA,MARCUS V 08/29/2014, 6:17 AM

## 2014-09-01 ENCOUNTER — Encounter (HOSPITAL_COMMUNITY): Payer: Self-pay | Admitting: Orthopedic Surgery

## 2014-09-25 ENCOUNTER — Encounter (HOSPITAL_COMMUNITY): Payer: Self-pay | Admitting: Cardiovascular Disease

## 2014-09-29 ENCOUNTER — Encounter: Payer: Self-pay | Admitting: Cardiology

## 2014-09-29 ENCOUNTER — Ambulatory Visit (INDEPENDENT_AMBULATORY_CARE_PROVIDER_SITE_OTHER): Payer: 59 | Admitting: Cardiology

## 2014-09-29 VITALS — BP 101/68 | HR 71 | Ht 71.0 in | Wt 244.4 lb

## 2014-09-29 DIAGNOSIS — E7800 Pure hypercholesterolemia, unspecified: Secondary | ICD-10-CM

## 2014-09-29 DIAGNOSIS — I5022 Chronic systolic (congestive) heart failure: Secondary | ICD-10-CM

## 2014-09-29 DIAGNOSIS — I340 Nonrheumatic mitral (valve) insufficiency: Secondary | ICD-10-CM

## 2014-09-29 DIAGNOSIS — I2510 Atherosclerotic heart disease of native coronary artery without angina pectoris: Secondary | ICD-10-CM

## 2014-09-29 MED ORDER — ATORVASTATIN CALCIUM 80 MG PO TABS: 80.0000 mg | ORAL_TABLET | Freq: Every day | ORAL | Status: DC

## 2014-09-29 NOTE — Progress Notes (Signed)
Clinical Summary Albert [name] is a 53 y.o.male seen today for follow up of the following medical problems, this is our first visit together.    1. Chronic systolic heart failure - ICM, LVEF 40-45% by echo 12/2013, grade II diastolic dysfunction - denies any SOB or DOE, though fairly sedentary lifestyle. Can have some LE edema, though mainly started after most recent toe amputation. No orthopnea, no PND - compliant with meds   2. Mitral regurgitation - moderate by TEE 06/2013 and by TTE 12/2013 - denies any significant symptoms  3. CAD - hx of demand ischemia 06/2013 in setting of sepsis with necrotic foot wound. - cath at that time 06/2013 LM patent, LAD 40% prox, 70% mid follow by 60% lesion, distal 90%. Diag 99%. LCX 80-90% prox, OM1 80%, OM2 99%, OM3 99 % (all OMs are small), RCA small non-dom mid 99%. LVEF 40% by LVgram.  - cath 06/2013 in setting of active leg infection, CAD was managed medically after discussions with CT surgery at that time, with plans to reevaluate once recurring infection issues resolved.  - denies any chest pain  4. PAD - followed by vascular and ortho - previous right BKA, previous multi toe amputations. Most recently 08/30/14 treated for osteo and ulcer of left great toe with amputation.   5. Hyperlipidemia - compliant with statin  Past Medical History  Diagnosis Date  . Hypertension   . Hypercholesteremia   . CVA (cerebral infarction) 2011    R hand deficit  . Chronic sinusitis   . Allergic rhinitis   . Chronic cough   . Complication of anesthesia     couldn't swallow and talk  . Splenic infarct     setting of lupus anticoagulant  . Myocardial infarction   . Stroke     right arm weakness  . Pneumonia     hx of walking  . Diabetes mellitus without complication ~2458    last HbA1c ~9 .... fasting 160s  . GERD (gastroesophageal reflux disease)   . Anemia     takes supplement     Allergies  Allergen Reactions  . Amoxicillin Nausea And  Vomiting     Current Outpatient Prescriptions  Medication Sig Dispense Refill  . ALPRAZolam (XANAX) 0.25 MG tablet Take 1 tablet (0.25 mg total) by mouth 2 (two) times daily as needed for anxiety. 30 tablet 0  . aspirin EC 81 MG tablet Take 1 tablet (81 mg total) by mouth daily. 30 tablet 1  . carvedilol (COREG) 25 MG tablet TAKE 1 AND 1/2 TABLETS(37.5MG ) BY MOUTH TWO TIMES DAILY WITH A MEAL 90 tablet 1  . ferrous sulfate 325 (65 FE) MG EC tablet Take 325 mg by mouth daily with breakfast.    . furosemide (LASIX) 80 MG tablet Take 80 mg by mouth daily.    . hydrALAZINE (APRESOLINE) 10 MG tablet Take 1 tablet (10 mg total) by mouth 3 (three) times daily. 120 tablet 1  . HYDROcodone-acetaminophen (NORCO) 5-325 MG per tablet Take 1 tablet by mouth every 6 (six) hours as needed. 30 tablet 0  . insulin aspart (NOVOLOG) 100 UNIT/ML injection Inject 12 Units into the skin 3 (three) times daily before meals.     . insulin detemir (LEVEMIR) 100 UNIT/ML injection Inject 30 Units into the skin at bedtime.     Marland Kitchen KLOR-CON 10 10 MEQ tablet TAKE 1 TABLET BY MOUTH TWICE A DAY (Patient taking differently: TAKE 1 TABLET BY MOUTH daily) 60 tablet 3  .  Loratadine-Pseudoephedrine (CLARITIN-D 12 HOUR PO) Take 1 tablet by mouth as needed (allergies).     . losartan (COZAAR) 25 MG tablet Take 1 tablet (25 mg total) by mouth daily. 30 tablet 3  . oxyCODONE-acetaminophen (ROXICET) 5-325 MG per tablet Take 1-2 tablets by mouth every 4 (four) hours as needed for severe pain. 30 tablet 0  . pantoprazole (PROTONIX) 40 MG tablet Take 1 tablet (40 mg total) by mouth daily. 30 tablet 1  . polyethylene glycol (MIRALAX / GLYCOLAX) packet Take 17 g by mouth daily as needed for mild constipation.     . promethazine (PHENERGAN) 25 MG tablet Take 1 tab every 6-8 hours for nausea. (Patient taking differently: Take 25 mg by mouth every 6 (six) hours as needed for nausea. Take 1 tab every 6-8 hours for nausea.) 40 tablet 1  .  Rivaroxaban (XARELTO) 20 MG TABS tablet Take 1 tablet (20 mg total) by mouth daily with supper. To begin once 15mg  tablets are complete 30 tablet 2  . senna-docusate (SENOKOT-S) 8.6-50 MG per tablet Take 1 tablet by mouth daily.     . simvastatin (ZOCOR) 40 MG tablet Take 1 tablet (40 mg total) by mouth every evening. 30 tablet 1  . tamsulosin (FLOMAX) 0.4 MG CAPS capsule Take 1 capsule (0.4 mg total) by mouth daily after supper. 30 capsule 1   No current facility-administered medications for this visit.     Past Surgical History  Procedure Laterality Date  . 4th toe amputation Left Jan. 2014    4th toe in [place]  . Tonsillectomy and adenoidectomy    . Penile prosthesis implant    . I&d extremity Left 12/08/2012    Procedure: IRRIGATION AND DEBRIDEMENT EXTREMITY;  Surgeon: Mcarthur Rossetti, MD;  Location: [facility];  Service: Orthopedics;  Laterality: Left;  . Amputation Left 12/08/2012    Procedure: AMPUTATION DIGIT;  Surgeon: Mcarthur Rossetti, MD;  Location: [facility];  Service: Orthopedics;  Laterality: Left;  3rd toe amputation possible 5th toe amputation  . I&d extremity Left 12/11/2012    Procedure: REPEAT I&D LEFT FOOT;  Surgeon: Mcarthur Rossetti, MD;  Location: [facility];  Service: Orthopedics;  Laterality: Left;  . Amputation Left 12/11/2012    Procedure: Amputation of 2nd Toe;  Surgeon: Mcarthur Rossetti, MD;  Location: [facility];  Service: Orthopedics;  Laterality: Left;  . Bypass graft popliteal to tibial Left 12/13/2012    Procedure: BYPASS GRAFT POPLITEAL TO TIBIAL;  Surgeon: Serafina Mitchell, MD;  Location[facility];  Service: Vascular;  Laterality: Left;  Left Popliteal to Posterior Tibial Bypass Graft with reversed saphenous vein graft  . Incision and drainage Right 06/25/2013    Procedure: INCISION AND DRAINAGE RIGHT FOOT;  Surgeon: Mcarthur Rossetti, MD;  Location: [facility];  Service: Orthopedics;  Laterality: Right;  . Amputation Right 06/29/2013    Procedure: AMPUTATION  BELOW KNEE;  Surgeon: Newt Minion, MD;  Location: [facility];  Service: Orthopedics;  Laterality: Right;  . Tee without cardioversion [name] 07/03/2013    Procedure: TRANSESOPHAGEAL ECHOCARDIOGRAM (TEE);  Surgeon: Larey Dresser, MD;  Location: [place];  Service: Cardiovascular;  Laterality: [name];  . Stump revision Right 08/16/2013    Procedure: STUMP REVISION- right Below Knee Amputation;  Surgeon: Newt Minion, MD;  Location: [facility];  Service: Orthopedics;  Laterality: Right;  . Cardiac catheterization    . Amputation Left 08/29/2014    Procedure: Left Great Toe Amputation at MTP;  Surgeon: Meridee Score  V, MD;  Location: [facility];  Service: Orthopedics;  Laterality: Left;  . Left heart catheterization with coronary angiogram [name] 06/28/2013    Procedure: LEFT HEART CATHETERIZATION WITH CORONARY ANGIOGRAM;  Surgeon: Burnell Blanks, MD;  Location: [facility];  Service: Cardiovascular;  Laterality: [name];     Allergies  Allergen Reactions  . Amoxicillin Nausea And Vomiting      Family History  Problem Relation Age of Onset  . Diabetes    . Pancreatic cancer Mother   . Diabetes Mother   . Hyperlipidemia Mother   . Breast cancer Sister   . Depression Maternal Grandmother   . Heart disease Maternal Grandmother   . Depression Maternal Grandfather   . Heart disease Maternal Grandfather   . Depression Paternal Grandmother   . Heart disease Paternal Grandmother   . Depression Paternal Grandfather   . Heart disease Paternal Grandfather      Social History Mr. Camps reports that he has never smoked. He has never used smokeless tobacco. Mr. Briles reports that he does not drink alcohol.   Review of Systems CONSTITUTIONAL: No weight loss, fever, chills, weakness or fatigue.  HEENT: Eyes: No visual loss, blurred vision, double vision or yellow sclerae.No hearing loss, sneezing, congestion, runny nose or sore throat.  SKIN: No rash or itching.  CARDIOVASCULAR: per HPI RESPIRATORY: No  shortness of breath, cough or sputum.  GASTROINTESTINAL: No anorexia, nausea, vomiting or diarrhea. No abdominal pain or blood.  GENITOURINARY: No burning on urination, no polyuria NEUROLOGICAL: No headache, dizziness, syncope, paralysis, ataxia, numbness or tingling in the extremities. No change in bowel or bladder control.  MUSCULOSKELETAL: No muscle, back pain, joint pain or stiffness.  LYMPHATICS: No enlarged nodes. No history of splenectomy.  PSYCHIATRIC: No history of depression or anxiety.  ENDOCRINOLOGIC: No reports of sweating, cold or heat intolerance. No polyuria or polydipsia.  Marland Kitchen   Physical Examination p 71 bp 101/68 Wt 244 lbs BMI 34 Gen: resting comfortably, no acute distress HEENT: no scleral icterus, pupils equal round and reactive, no palptable cervical adenopathy,  CV: RRR, no m/r/g, no JVD, no carotid bruits Resp: Clear to auscultation bilaterally GI: abdomen is soft, non-tender, non-distended, normal bowel sounds, no hepatosplenomegaly MSK: extremities are warm, trace left leg edema Skin: warm, no rash Neuro:  no focal deficits Psych: appropriate affect   Diagnostic Studies  06/2013 Cath Hemodynamic Findings: Central aortic pressure: 90/61 Left ventricular pressure: 90/19/29  Angiographic Findings:  Left main: Short segment without obstruction.   Left Anterior Descending Artery: Moderate caliber diffusely diseased vessel that courses to the apex. The proximal vessel has diffuse 40% stenosis. The mid vessel has a 70% stenosis followed by a 60% stenosis. The distal vessel has diffuse 90% stenosis. The diagonal is a moderate caliber vessel with ostial 99% stenosis with 99% disease extending into the mid vessel.   Circumflex Artery: Large caliber vessel with proximal 80-90% stenosis. There are three small to moderate caliber obtuse marginal branches. The first OM has mid 80% stenosis. The second OM has a proximal 99% stenosis. The third OM has diffuse 99%  stenosis. The left sided posterolateral Eisa Necaise has 99% stenosis.   Right Coronary Artery: Small non-dominant vessel with mid 99% stenosis.   Left Ventricular Angiogram: LVEF=40%  Impression: 1. Triple vessel CAD 2. NSTEMI secondary to demand ischemia from sepsis with severe underlying CAD 3. Mild LV systolic dysfunction  Recommendations: Complex situation. The only option for coronary revascularization is CABG. He is not a favorable candidate  for CABG at this time with necrotic right foot, sepsis. Even though not ideal, would favor medical management of CAD while undergoing treatment of his necrotic foot. Would proceed with right BKA this weekend. I have discussed the case with CT surgery who agrees that CABG is not a good option at this time. Will ask CT surgery to see after he recovers from his BKA. Continue ASA, beta blocker, statin.    Complications: None. The patient tolerated the procedure well.      Assessment and Plan  1. Chronic systolic heart failure - ICM, LVEF 40-45% by echo 12/2013 with grade II diastolic dysfunction - NYHA II. Currently appears euvolemic - further medication titration limted by soft bp's, he is at goal on beta blocker.  - continue current meds  2. CAD - triple vessel CAD managed medically. Seen by CT surgery, due to ongoing recurrent LE leg infections and multiple surgeries, as well as overall high risk recs were for medical therapy, reconsideration if other comorbidities significanlty improved - repeat toe amputation just last month, we will continue medical therapy for his CAD and consider setting him back up with CT surgery if other medical issues improve  3. Hyperlipidemia - due to known CAD/PAD change to high dose statin, start atorva 80mg  daily  4. PAD - per vascular and ortho  5. Mitral regurgitation - moderate by recent imaging, no clinic symptoms at this time - continue to follow    F/u 3 months  Arnoldo Lenis,  M.D.

## 2014-09-29 NOTE — Patient Instructions (Signed)
   Stop Simvastatin  Begin Atorvastatin 80mg  daily - new sent to pharm Continue all other medications.   Follow up in  3 months

## 2014-10-01 ENCOUNTER — Ambulatory Visit: Payer: 59 | Admitting: Nurse Practitioner

## 2014-10-02 ENCOUNTER — Other Ambulatory Visit (HOSPITAL_COMMUNITY): Payer: Self-pay

## 2014-10-02 DIAGNOSIS — D735 Infarction of spleen: Secondary | ICD-10-CM

## 2014-10-06 ENCOUNTER — Other Ambulatory Visit (HOSPITAL_COMMUNITY): Payer: 59

## 2014-10-06 ENCOUNTER — Ambulatory Visit (HOSPITAL_COMMUNITY): Payer: 59 | Admitting: Hematology & Oncology

## 2014-10-20 ENCOUNTER — Telehealth: Payer: Self-pay | Admitting: *Deleted

## 2014-10-20 MED ORDER — CARVEDILOL 25 MG PO TABS: 37.5000 mg | ORAL_TABLET | Freq: Two times a day (BID) | ORAL | Status: DC

## 2014-10-20 NOTE — Telephone Encounter (Signed)
Message left on voice mail - req Coreg refill.  Refill sent to pharm.    Attempted to notify patient - info left on voice mail.

## 2014-10-30 ENCOUNTER — Encounter (HOSPITAL_COMMUNITY): Payer: Self-pay | Admitting: *Deleted

## 2014-10-30 ENCOUNTER — Other Ambulatory Visit (HOSPITAL_COMMUNITY): Payer: Self-pay | Admitting: Orthopedic Surgery

## 2014-10-30 MED ORDER — CLINDAMYCIN PHOSPHATE 900 MG/50ML IV SOLN
900.0000 mg | INTRAVENOUS | Status: AC
  Administered 2014-10-31: 900 mg via INTRAVENOUS
  Filled 2014-10-30: qty 50

## 2014-10-30 NOTE — Progress Notes (Signed)
   10/30/14 1643  OBSTRUCTIVE SLEEP APNEA  Have you ever been diagnosed with sleep apnea through a sleep study? No  Do you snore loudly (loud enough to be heard through closed doors)?  0  Do you often feel tired, fatigued, or sleepy during the daytime? 1  Has anyone observed you stop breathing during your sleep? 0  Do you have, or are you being treated for high blood pressure? 1  BMI more than 35 kg/m2? 0  Age over 52 years old? 1  Gender: 1  Obstructive Sleep Apnea Score 4

## 2014-10-31 ENCOUNTER — Ambulatory Visit (HOSPITAL_COMMUNITY): Payer: 59 | Admitting: Anesthesiology

## 2014-10-31 ENCOUNTER — Ambulatory Visit (HOSPITAL_COMMUNITY)
Admission: RE | Admit: 2014-10-31 | Discharge: 2014-10-31 | Disposition: A | Discharge status: Home / Self Care | Payer: 59 | Source: Ambulatory Visit | Attending: Orthopedic Surgery | Admitting: Orthopedic Surgery

## 2014-10-31 ENCOUNTER — Encounter (HOSPITAL_COMMUNITY): Payer: Self-pay | Admitting: *Deleted

## 2014-10-31 ENCOUNTER — Encounter (HOSPITAL_COMMUNITY)
Admission: RE | Disposition: A | Discharge status: Home / Self Care | Payer: Self-pay | Source: Ambulatory Visit | Attending: Orthopedic Surgery

## 2014-10-31 DIAGNOSIS — E1140 Type 2 diabetes mellitus with diabetic neuropathy, unspecified: Secondary | ICD-10-CM | POA: Diagnosis not present

## 2014-10-31 DIAGNOSIS — Z794 Long term (current) use of insulin: Secondary | ICD-10-CM | POA: Insufficient documentation

## 2014-10-31 DIAGNOSIS — Z7982 Long term (current) use of aspirin: Secondary | ICD-10-CM | POA: Diagnosis not present

## 2014-10-31 DIAGNOSIS — K219 Gastro-esophageal reflux disease without esophagitis: Secondary | ICD-10-CM | POA: Diagnosis not present

## 2014-10-31 DIAGNOSIS — D649 Anemia, unspecified: Secondary | ICD-10-CM | POA: Insufficient documentation

## 2014-10-31 DIAGNOSIS — I252 Old myocardial infarction: Secondary | ICD-10-CM | POA: Diagnosis not present

## 2014-10-31 DIAGNOSIS — I10 Essential (primary) hypertension: Secondary | ICD-10-CM | POA: Insufficient documentation

## 2014-10-31 DIAGNOSIS — M868X7 Other osteomyelitis, ankle and foot: Secondary | ICD-10-CM | POA: Insufficient documentation

## 2014-10-31 DIAGNOSIS — L97519 Non-pressure chronic ulcer of other part of right foot with unspecified severity: Secondary | ICD-10-CM | POA: Insufficient documentation

## 2014-10-31 DIAGNOSIS — E780 Pure hypercholesterolemia: Secondary | ICD-10-CM | POA: Insufficient documentation

## 2014-10-31 DIAGNOSIS — E119 Type 2 diabetes mellitus without complications: Secondary | ICD-10-CM | POA: Diagnosis not present

## 2014-10-31 DIAGNOSIS — Z8673 Personal history of transient ischemic attack (TIA), and cerebral infarction without residual deficits: Secondary | ICD-10-CM | POA: Insufficient documentation

## 2014-10-31 DIAGNOSIS — M869 Osteomyelitis, unspecified: Secondary | ICD-10-CM

## 2014-10-31 HISTORY — DX: Osteomyelitis, unspecified: M86.9

## 2014-10-31 HISTORY — PX: AMPUTATION: SHX166

## 2014-10-31 LAB — PROTIME-INR
INR: 1.02 (ref 0.00–1.49)
Prothrombin Time: 13.5 seconds (ref 11.6–15.2)

## 2014-10-31 LAB — GLUCOSE, CAPILLARY
GLUCOSE-CAPILLARY: 131 mg/dL — AB (ref 70–99)
Glucose-Capillary: 127 mg/dL — ABNORMAL HIGH (ref 70–99)
Glucose-Capillary: 133 mg/dL — ABNORMAL HIGH (ref 70–99)
Glucose-Capillary: 130 mg/dL — ABNORMAL HIGH (ref 70–99)

## 2014-10-31 LAB — COMPREHENSIVE METABOLIC PANEL
ALK PHOS: 64 U/L (ref 39–117)
ALT: 33 U/L (ref 0–53)
AST: 30 U/L (ref 0–37)
Albumin: 4 g/dL (ref 3.5–5.2)
Anion gap: 10 (ref 5–15)
BILIRUBIN TOTAL: 0.8 mg/dL (ref 0.3–1.2)
BUN: 20 mg/dL (ref 6–23)
CO2: 22 mmol/L (ref 19–32)
Calcium: 9 mg/dL (ref 8.4–10.5)
Chloride: 107 mEq/L (ref 96–112)
Creatinine, Ser: 1.14 mg/dL (ref 0.50–1.35)
GFR calc Af Amer: 84 mL/min — ABNORMAL LOW (ref >90)
GFR calc non Af Amer: 73 mL/min — ABNORMAL LOW (ref >90)
Glucose, Bld: 140 mg/dL — ABNORMAL HIGH (ref 70–99)
Potassium: 4.4 mmol/L (ref 3.5–5.1)
SODIUM: 139 mmol/L (ref 135–145)
Total Protein: 6.7 g/dL (ref 6.0–8.3)

## 2014-10-31 LAB — CBC
HCT: 39.2 % (ref 39.0–52.0)
HEMOGLOBIN: 13 g/dL (ref 13.0–17.0)
MCH: 27 pg (ref 26.0–34.0)
MCHC: 33.2 g/dL (ref 30.0–36.0)
MCV: 81.5 fL (ref 78.0–100.0)
Platelets: 223 10*3/uL (ref 150–400)
RBC: 4.81 MIL/uL (ref 4.22–5.81)
RDW: 13.1 % (ref 11.5–15.5)
WBC: 8.1 10*3/uL (ref 4.0–10.5)

## 2014-10-31 LAB — APTT: APTT: 22 s — AB (ref 24–37)

## 2014-10-31 SURGERY — AMPUTATION, FOOT, PARTIAL
Anesthesia: General | Laterality: Left

## 2014-10-31 MED ORDER — 0.9 % SODIUM CHLORIDE (POUR BTL) OPTIME
TOPICAL | Status: DC | PRN
  Administered 2014-10-31: 1000 mL

## 2014-10-31 MED ORDER — LIDOCAINE HCL (CARDIAC) 20 MG/ML IV SOLN
INTRAVENOUS | Status: AC
  Filled 2014-10-31: qty 5

## 2014-10-31 MED ORDER — FENTANYL CITRATE 0.05 MG/ML IJ SOLN
INTRAMUSCULAR | Status: DC | PRN
  Administered 2014-10-31: 75 ug via INTRAVENOUS

## 2014-10-31 MED ORDER — PROPOFOL 10 MG/ML IV BOLUS
INTRAVENOUS | Status: AC
  Filled 2014-10-31: qty 20

## 2014-10-31 MED ORDER — ONDANSETRON HCL 4 MG/2ML IJ SOLN
INTRAMUSCULAR | Status: DC | PRN
  Administered 2014-10-31: 4 mg via INTRAVENOUS

## 2014-10-31 MED ORDER — HYDROMORPHONE HCL 1 MG/ML IJ SOLN: 0.2500 mg | INTRAMUSCULAR | Status: DC | PRN

## 2014-10-31 MED ORDER — LACTATED RINGERS IV SOLN
INTRAVENOUS | Status: DC | PRN
  Administered 2014-10-31: 15:00:00 via INTRAVENOUS

## 2014-10-31 MED ORDER — FENTANYL CITRATE 0.05 MG/ML IJ SOLN
INTRAMUSCULAR | Status: AC
  Filled 2014-10-31: qty 5

## 2014-10-31 MED ORDER — PROPOFOL 10 MG/ML IV BOLUS
INTRAVENOUS | Status: DC | PRN
  Administered 2014-10-31: 160 mg via INTRAVENOUS

## 2014-10-31 MED ORDER — LIDOCAINE HCL (CARDIAC) 20 MG/ML IV SOLN
INTRAVENOUS | Status: DC | PRN
  Administered 2014-10-31: 100 mg via INTRAVENOUS

## 2014-10-31 MED ORDER — GLYCOPYRROLATE 0.2 MG/ML IJ SOLN
INTRAMUSCULAR | Status: DC | PRN
  Administered 2014-10-31: 0.1 mg via INTRAVENOUS

## 2014-10-31 MED ORDER — LACTATED RINGERS IV SOLN
INTRAVENOUS | Status: DC
  Administered 2014-10-31: 11:00:00 via INTRAVENOUS

## 2014-10-31 MED ORDER — PROMETHAZINE HCL 25 MG/ML IJ SOLN: 6.2500 mg | INTRAMUSCULAR | Status: DC | PRN

## 2014-10-31 MED ORDER — MEPERIDINE HCL 25 MG/ML IJ SOLN: 6.2500 mg | INTRAMUSCULAR | Status: DC | PRN

## 2014-10-31 MED ORDER — FENTANYL CITRATE 0.05 MG/ML IJ SOLN: 25.0000 ug | INTRAMUSCULAR | Status: DC | PRN

## 2014-10-31 MED ORDER — MIDAZOLAM HCL 2 MG/2ML IJ SOLN
INTRAMUSCULAR | Status: AC
  Filled 2014-10-31: qty 2

## 2014-10-31 MED ORDER — EPHEDRINE SULFATE 50 MG/ML IJ SOLN
INTRAMUSCULAR | Status: DC | PRN
  Administered 2014-10-31 (×2): 10 mg via INTRAVENOUS

## 2014-10-31 MED ORDER — MIDAZOLAM HCL 5 MG/5ML IJ SOLN
INTRAMUSCULAR | Status: DC | PRN
  Administered 2014-10-31: 2 mg via INTRAVENOUS

## 2014-10-31 MED ORDER — HYDROCODONE-ACETAMINOPHEN 5-325 MG PO TABS: 1.0000 | ORAL_TABLET | Freq: Four times a day (QID) | ORAL | Status: DC | PRN

## 2014-10-31 SURGICAL SUPPLY — 29 items
BLADE SAW SGTL HD 18.5X60.5X1. (BLADE) ×3 IMPLANT
BLADE SURG 10 STRL SS (BLADE) ×3 IMPLANT
BNDG COHESIVE 4X5 TAN STRL (GAUZE/BANDAGES/DRESSINGS) ×3 IMPLANT
BNDG GAUZE ELAST 4 BULKY (GAUZE/BANDAGES/DRESSINGS) ×3 IMPLANT
COVER SURGICAL LIGHT HANDLE (MISCELLANEOUS) ×3 IMPLANT
DRAPE U-SHAPE 47X51 STRL (DRAPES) ×3 IMPLANT
DRSG ADAPTIC 3X8 NADH LF (GAUZE/BANDAGES/DRESSINGS) ×3 IMPLANT
DRSG PAD ABDOMINAL 8X10 ST (GAUZE/BANDAGES/DRESSINGS) ×3 IMPLANT
DURAPREP 26ML APPLICATOR (WOUND CARE) ×3 IMPLANT
ELECT REM PT RETURN 9FT ADLT (ELECTROSURGICAL) ×3
ELECTRODE REM PT RTRN 9FT ADLT (ELECTROSURGICAL) ×1 IMPLANT
GAUZE SPONGE 4X4 12PLY STRL (GAUZE/BANDAGES/DRESSINGS) ×3 IMPLANT
GLOVE BIOGEL PI IND STRL 9 (GLOVE) ×1 IMPLANT
GLOVE BIOGEL PI INDICATOR 9 (GLOVE) ×2
GLOVE SURG ORTHO 9.0 STRL STRW (GLOVE) ×3 IMPLANT
GOWN STRL REUS W/ TWL XL LVL3 (GOWN DISPOSABLE) ×3 IMPLANT
GOWN STRL REUS W/TWL XL LVL3 (GOWN DISPOSABLE) ×6
KIT BASIN OR (CUSTOM PROCEDURE TRAY) ×3 IMPLANT
KIT ROOM TURNOVER OR (KITS) ×3 IMPLANT
NS IRRIG 1000ML POUR BTL (IV SOLUTION) ×3 IMPLANT
PACK ORTHO EXTREMITY (CUSTOM PROCEDURE TRAY) ×3 IMPLANT
PAD ARMBOARD 7.5X6 YLW CONV (MISCELLANEOUS) ×6 IMPLANT
SPONGE GAUZE 4X4 12PLY STER LF (GAUZE/BANDAGES/DRESSINGS) ×3 IMPLANT
SPONGE LAP 18X18 X RAY DECT (DISPOSABLE) ×3 IMPLANT
SUT ETHILON 2 0 PSLX (SUTURE) ×9 IMPLANT
SUT VIC AB 2-0 CTB1 (SUTURE) IMPLANT
TOWEL OR 17X24 6PK STRL BLUE (TOWEL DISPOSABLE) ×3 IMPLANT
TOWEL OR 17X26 10 PK STRL BLUE (TOWEL DISPOSABLE) ×3 IMPLANT
WATER STERILE IRR 1000ML POUR (IV SOLUTION) IMPLANT

## 2014-10-31 NOTE — Anesthesia Postprocedure Evaluation (Signed)
  Anesthesia Post-op Note  Patient: Albert [name]  Procedure(s) Performed: Procedure(s): Midfoot Amputation (Left)  Patient Location: PACU  Anesthesia Type:GA combined with regional for post-op pain  Level of Consciousness: awake, alert , oriented and patient cooperative  Airway and Oxygen Therapy: Patient Spontanous Breathing  Post-op Pain: mild  Post-op Assessment: Post-op Vital signs reviewed, Patient's Cardiovascular Status Stable, Respiratory Function Stable, Patent Airway, No signs of Nausea or vomiting and Pain level controlled  Post-op Vital Signs: stable  Last Vitals:  Filed Vitals:   10/31/14 1645  BP:   Pulse: 79  Temp:   Resp: 15    Complications: No apparent anesthesia complications

## 2014-10-31 NOTE — Op Note (Signed)
10/31/2014  6:24 PM  PATIENT:  Albert [name]    PRE-OPERATIVE DIAGNOSIS:  Osteomyelitis 1st MT and 3rd MT Left Foot  POST-OPERATIVE DIAGNOSIS:  Same  PROCEDURE:  Midfoot Amputation  SURGEON:  Newt Minion, MD  PHYSICIAN ASSISTANT:None ANESTHESIA:   General  PREOPERATIVE INDICATIONS:  [name] is a  54 y.o. male with a diagnosis of Osteomyelitis 1st MT and 3rd MT Left Foot who failed conservative measures and elected for surgical management.    The risks benefits and alternatives were discussed with the patient preoperatively including but not limited to the risks of infection, bleeding, nerve injury, cardiopulmonary complications, the need for revision surgery, among others, and the patient was willing to proceed.  OPERATIVE IMPLANTS: None  OPERATIVE FINDINGS: Good petechial bleeding  OPERATIVE PROCEDURE: Patient was brought to the operating room and underwent a general anesthetic. After adequate levels of anesthesia were obtained patient's left lower extremity was prepped using DuraPrep draped into a sterile field. A timeout was called. A fishmouth incision was made around the ulcer of the first and third metatarsals. Oscillating saw was used to create a transmetatarsal amputation. Hemostasis was obtained. The wound was irrigated with normal saline. The incision was closed using 2-0 nylon. A sterile compressive dressing was applied. Patient was extubated taken to the PACU in stable condition.

## 2014-10-31 NOTE — Progress Notes (Signed)
Orthopedic Tech Progress Note Patient Details:  Albert [name] [birth year] [id number]  Ortho Devices Type of Ortho Device: Postop shoe/boot Ortho Device/Splint Location: LLE Ortho Device/Splint Interventions: Application, Ordered   Braulio Bosch 10/31/2014, 6:29 PM

## 2014-10-31 NOTE — Transfer of Care (Signed)
Immediate Anesthesia Transfer of Care Note  Patient: Albert [name]  Procedure(s) Performed: Procedure(s): Midfoot Amputation (Left)  Patient Location: PACU  Anesthesia Type:General  Level of Consciousness: awake and alert   Airway & Oxygen Therapy: Patient Spontanous Breathing and Patient connected to nasal cannula oxygen  Post-op Assessment: Report given to PACU RN and Post -op Vital signs reviewed and stable  Post vital signs: Reviewed and stable  Complications: No apparent anesthesia complications

## 2014-10-31 NOTE — Anesthesia Preprocedure Evaluation (Signed)
Anesthesia Evaluation  Patient identified by MRN, date of birth, ID band Patient awake    Reviewed: Allergy & Precautions, NPO status , Patient's Chart, lab work & pertinent test results, reviewed documented beta blocker date and time   Airway        Dental   Pulmonary          Cardiovascular hypertension, Pt. on medications  ECHO 2015 76-73% grade 2 diastolic dysfunction   Neuro/Psych    GI/Hepatic Neg liver ROS, GERD-  Medicated,  Endo/Other  diabetes, Type 2  Renal/GU GFR 80     Musculoskeletal   Abdominal   Peds  Hematology   Anesthesia Other Findings   Reproductive/Obstetrics                             Anesthesia Physical Anesthesia Plan  ASA: III  Anesthesia Plan: General   Post-op Pain Management:    Induction: Intravenous  Airway Management Planned: Oral ETT  Additional Equipment:   Intra-op Plan:   Post-operative Plan: Extubation in OR  Informed Consent: I have reviewed the patients History and Physical, chart, labs and discussed the procedure including the risks, benefits and alternatives for the proposed anesthesia with the patient or authorized representative who has indicated his/her understanding and acceptance.     Plan Discussed with:   Anesthesia Plan Comments:         Anesthesia Quick Evaluation

## 2014-10-31 NOTE — Anesthesia Procedure Notes (Signed)
Procedure Name: LMA Insertion Date/Time: 10/31/2014 3:22 PM Performed by: Ollen Bowl Pre-anesthesia Checklist: Patient identified, Emergency Drugs available, Suction available, Patient being monitored and Timeout performed Patient Re-evaluated:Patient Re-evaluated prior to inductionOxygen Delivery Method: Circle system utilized and Simple face mask Preoxygenation: Pre-oxygenation with 100% oxygen Intubation Type: IV induction LMA: LMA inserted LMA Size: 5.0 Number of attempts: 1 Airway Equipment and Method: Patient positioned with wedge pillow Placement Confirmation: positive ETCO2 and breath sounds checked- equal and bilateral Tube secured with: Tape Dental Injury: Teeth and Oropharynx as per pre-operative assessment

## 2014-10-31 NOTE — H&P (Signed)
Albert [name] is an 54 y.o. male.   Chief Complaint: Osteomyelitis right great toe with diabetic insensate neuropathy HPI: Patient is a 54 year old gentleman who is status post multiple foot salvage interventions who presents at this time with progressive ulceration osteomyelitis first metatarsal head right foot  Past Medical History  Diagnosis Date  . Hypertension   . Hypercholesteremia   . CVA (cerebral infarction) 2011    R hand deficit  . Chronic sinusitis   . Allergic rhinitis   . Chronic cough   . Complication of anesthesia     couldn't swallow and talk  . Splenic infarct     setting of lupus anticoagulant  . Myocardial infarction   . Stroke     right arm weakness  . Pneumonia     hx of walking  . Diabetes mellitus without complication ~0737    last HbA1c ~9 .... fasting 160s  . GERD (gastroesophageal reflux disease)   . Anemia     takes supplement  . Osteomyelitis     first and third metatarsal    Past Surgical History  Procedure Laterality Date  . 4th toe amputation Left Jan. 2014    4th toe in [place]  . Tonsillectomy and adenoidectomy    . Penile prosthesis implant    . I&d extremity Left 12/08/2012    Procedure: IRRIGATION AND DEBRIDEMENT EXTREMITY;  Surgeon: Mcarthur Rossetti, MD;  Location: [facility];  Service: Orthopedics;  Laterality: Left;  . Amputation Left 12/08/2012    Procedure: AMPUTATION DIGIT;  Surgeon: Mcarthur Rossetti, MD;  Location: [facility];  Service: Orthopedics;  Laterality: Left;  3rd toe amputation possible 5th toe amputation  . I&d extremity Left 12/11/2012    Procedure: REPEAT I&D LEFT FOOT;  Surgeon: Mcarthur Rossetti, MD;  Location: [facility];  Service: Orthopedics;  Laterality: Left;  . Amputation Left 12/11/2012    Procedure: Amputation of 2nd Toe;  Surgeon: Mcarthur Rossetti, MD;  Location: [facility] [facility];  Service: Orthopedics;  Laterality: Left;  . Bypass graft popliteal to tibial Left 12/13/2012    Procedure: BYPASS GRAFT POPLITEAL TO  TIBIAL;  Surgeon: Serafina Mitchell, MD;  Location[facility];  Service: Vascular;  Laterality: Left;  Left Popliteal to Posterior Tibial Bypass Graft with reversed saphenous vein graft  . Incision and drainage Right 06/25/2013    Procedure: INCISION AND DRAINAGE RIGHT FOOT;  Surgeon: Mcarthur Rossetti, MD;  Location: [facility];  Service: Orthopedics;  Laterality: Right;  . Amputation Right 06/29/2013    Procedure: AMPUTATION BELOW KNEE;  Surgeon: Newt Minion, MD;  Location: [place];  Service: Orthopedics;  Laterality: Right;  . Tee without cardioversion [name] 07/03/2013    Procedure: TRANSESOPHAGEAL ECHOCARDIOGRAM (TEE);  Surgeon: Larey Dresser, MD;  Location: [place];  Service: Cardiovascular;  Laterality: [name];  . Stump revision Right 08/16/2013    Procedure: STUMP REVISION- right Below Knee Amputation;  Surgeon: Newt Minion, MD;  Location: [place];  Service: Orthopedics;  Laterality: Right;  . Cardiac catheterization    . Amputation Left 08/29/2014    Procedure: Left Great Toe Amputation at MTP;  Surgeon: Newt Minion, MD;  Location: [place] [facility];  Service: Orthopedics;  Laterality: Left;  . Left heart catheterization with coronary angiogram [name] 06/28/2013    Procedure: LEFT HEART CATHETERIZATION WITH CORONARY ANGIOGRAM;  Surgeon: Burnell Blanks, MD;  Location: [facility];  Service: Cardiovascular;  Laterality: [name];    Family History  Problem Relation Age of  Onset  . Diabetes    . Pancreatic cancer Mother   . Diabetes Mother   . Hyperlipidemia Mother   . Breast cancer Sister   . Depression Maternal Grandmother   . Heart disease Maternal Grandmother   . Depression Maternal Grandfather   . Heart disease Maternal Grandfather   . Depression Paternal Grandmother   . Heart disease Paternal Grandmother   . Depression Paternal Grandfather   . Heart disease Paternal Grandfather    Social History:  reports that he has never smoked. He has never used smokeless tobacco. He reports that he does  not drink alcohol or use illicit drugs.  Allergies:  Allergies  Allergen Reactions  . Amoxicillin Nausea And Vomiting    No prescriptions prior to admission    No results found for this or any previous visit (from the past 48 hour(s)). No results found.  Review of Systems  All other systems reviewed and are negative.   There were no vitals taken for this visit. Physical Exam  On examination the first metatarsal head of the right foot is exposed there is osteomyelitis and ulceration. Assessment/Plan Assessment: Osteomyelitis ulceration first metatarsal head right foot.  Plan: We will plan for revision to a transmetatarsal amputation. Discussed and benefits were discussed patient states he understands and wished to proceed at this time.  Mysti Haley V 10/31/2014, 6:42 AM

## 2014-11-03 ENCOUNTER — Encounter (HOSPITAL_COMMUNITY): Payer: Self-pay | Admitting: Orthopedic Surgery

## 2014-12-25 ENCOUNTER — Ambulatory Visit (INDEPENDENT_AMBULATORY_CARE_PROVIDER_SITE_OTHER): Payer: 59 | Admitting: Cardiology

## 2014-12-25 ENCOUNTER — Encounter: Payer: Self-pay | Admitting: Cardiology

## 2014-12-25 VITALS — BP 106/72 | HR 67 | Ht 71.0 in | Wt 247.0 lb

## 2014-12-25 DIAGNOSIS — E7800 Pure hypercholesterolemia, unspecified: Secondary | ICD-10-CM

## 2014-12-25 DIAGNOSIS — I2510 Atherosclerotic heart disease of native coronary artery without angina pectoris: Secondary | ICD-10-CM

## 2014-12-25 DIAGNOSIS — I5021 Acute systolic (congestive) heart failure: Secondary | ICD-10-CM

## 2014-12-25 DIAGNOSIS — I340 Nonrheumatic mitral (valve) insufficiency: Secondary | ICD-10-CM

## 2014-12-25 DIAGNOSIS — I5022 Chronic systolic (congestive) heart failure: Secondary | ICD-10-CM

## 2014-12-25 NOTE — Patient Instructions (Signed)
Your physician recommends that you schedule a follow-up appointment in: 4 months with Dr. Harl Bowie  Your physician recommends that you continue on your current medications as directed. Please refer to the Current Medication list given to you today.  Thank you for choosing [facility]!!

## 2014-12-25 NOTE — Progress Notes (Signed)
Clinical Summary [name] is a 54 y.o.male seen today for follow up fo the following medical problems.   1. Chronic combined systolic/diastolic heart failure - ICM, LVEF 40-45% by echo 12/2013, grade II diastolic dysfunction - denies any SOB or DOE.  - compliant with meds. Working to limit salt intake,limiting NSAIDs  2. Mitral regurgitation - moderate by TEE 06/2013 and by TTE 12/2013 - denies any significant symptoms  3. CAD - hx of demand ischemia 06/2013 in setting of sepsis with necrotic foot wound. - cath at that time 06/2013 LM patent, LAD 40% prox, 70% mid follow by 60% lesion, distal 90%. Diag 99%. LCX 80-90% prox, OM1 80%, OM2 99%, OM3 99 % (all OMs are small), RCA small non-dom mid 99%. LVEF 40% by LVgram.  - CAD was managed medically after discussions with CT surgery at that time, with plans to reevaluate once recurring infection issues resolved.  - denies any recent chest pain  4. PAD - followed by vascular and ortho - previous right BKA, previous multiple toe amputations. 08/30/14 treated for osteo and ulcer of left great toe with amputation. Jan 2016 had left midfoot amputation.  - denies any current pain  5. Hyperlipidemia - compliant with statin - Lipid panel 12/2014 TC 92 TG 108 HDL 24 LDL 46 Past Medical History  Diagnosis Date  . Hypertension   . Hypercholesteremia   . CVA (cerebral infarction) 2011    R hand deficit  . Chronic sinusitis   . Allergic rhinitis   . Chronic cough   . Complication of anesthesia     couldn't swallow and talk  . Splenic infarct     setting of lupus anticoagulant  . Myocardial infarction   . Stroke     right arm weakness  . Pneumonia     hx of walking  . Diabetes mellitus without complication ~3557    last HbA1c ~9 .... fasting 160s  . GERD (gastroesophageal reflux disease)   . Anemia     takes supplement  . Osteomyelitis     first and third metatarsal     Allergies  Allergen Reactions  . Amoxicillin Nausea  And Vomiting     Current Outpatient Prescriptions  Medication Sig Dispense Refill  . ALPRAZolam (XANAX) 0.25 MG tablet Take 1 tablet (0.25 mg total) by mouth 2 (two) times daily as needed for anxiety. 30 tablet 0  . aspirin EC 81 MG tablet Take 1 tablet (81 mg total) by mouth daily. 30 tablet 1  . atorvastatin (LIPITOR) 80 MG tablet Take 1 tablet (80 mg total) by mouth daily. 30 tablet 6  . carvedilol (COREG) 25 MG tablet Take 1.5 tablets (37.5 mg total) by mouth 2 (two) times daily. 90 tablet 6  . cephALEXin (KEFLEX) 500 MG capsule Take 500 mg by mouth 3 (three) times daily.    . ferrous sulfate 325 (65 FE) MG EC tablet Take 325 mg by mouth daily with breakfast.    . furosemide (LASIX) 80 MG tablet Take 80 mg by mouth daily.    . hydrALAZINE (APRESOLINE) 10 MG tablet Take 1 tablet (10 mg total) by mouth 3 (three) times daily. 120 tablet 1  . HYDROcodone-acetaminophen (NORCO) 5-325 MG per tablet Take 1 tablet by mouth every 6 (six) hours as needed. (Patient taking differently: Take 1 tablet by mouth every 6 (six) hours as needed for moderate pain. ) 30 tablet 0  . HYDROcodone-acetaminophen (NORCO) 5-325 MG per tablet Take 1 tablet by  mouth every 6 (six) hours as needed. 30 tablet 0  . insulin aspart (NOVOLOG) 100 UNIT/ML injection Inject 14 Units into the skin 3 (three) times daily before meals.     . insulin detemir (LEVEMIR) 100 UNIT/ML injection Inject 34 Units into the skin at bedtime.     Marland Kitchen KLOR-CON 10 10 MEQ tablet TAKE 1 TABLET BY MOUTH TWICE A DAY (Patient taking differently: TAKE 1 TABLET BY MOUTH daily) 60 tablet 3  . Loratadine-Pseudoephedrine (CLARITIN-D 12 HOUR PO) Take 1 tablet by mouth as needed (allergies).     . losartan (COZAAR) 25 MG tablet Take 1 tablet (25 mg total) by mouth daily. 30 tablet 3  . pantoprazole (PROTONIX) 40 MG tablet Take 1 tablet (40 mg total) by mouth daily. 30 tablet 1  . polyethylene glycol (MIRALAX / GLYCOLAX) packet Take 17 g by mouth daily as needed  for mild constipation.     . potassium chloride (K-DUR,KLOR-CON) 10 MEQ tablet Take 10 mEq by mouth daily.    . promethazine (PHENERGAN) 25 MG tablet Take 1 tab every 6-8 hours for nausea. (Patient taking differently: Take 25 mg by mouth every 6 (six) hours as needed for nausea. Take 1 tab every 6-8 hours for nausea.) 40 tablet 1  . senna-docusate (SENOKOT-S) 8.6-50 MG per tablet Take 1 tablet by mouth daily as needed for mild constipation.     . tamsulosin (FLOMAX) 0.4 MG CAPS capsule Take 1 capsule (0.4 mg total) by mouth daily after supper. 30 capsule 1   No current facility-administered medications for this visit.     Past Surgical History  Procedure Laterality Date  . 4th toe amputation Left Jan. 2014    4th toe in [place]  . Tonsillectomy and adenoidectomy    . Penile prosthesis implant    . I&d extremity Left 12/08/2012    Procedure: IRRIGATION AND DEBRIDEMENT EXTREMITY;  Surgeon: Mcarthur Rossetti, MD;  Location: [place];  Service: Orthopedics;  Laterality: Left;  . Amputation Left 12/08/2012    Procedure: AMPUTATION DIGIT;  Surgeon: Mcarthur Rossetti, MD;  Location: [place];  Service: Orthopedics;  Laterality: Left;  3rd toe amputation possible 5th toe amputation  . I&d extremity Left 12/11/2012    Procedure: REPEAT I&D LEFT FOOT;  Surgeon: Mcarthur Rossetti, MD;  Location: [place];  Service: Orthopedics;  Laterality: Left;  . Amputation Left 12/11/2012    Procedure: Amputation of 2nd Toe;  Surgeon: Mcarthur Rossetti, MD;  Location: [place];  Service: Orthopedics;  Laterality: Left;  . Bypass graft popliteal to tibial Left 12/13/2012    Procedure: BYPASS GRAFT POPLITEAL TO TIBIAL;  Surgeon: Serafina Mitchell, MD;  Location: [facility];  Service: Vascular;  Laterality: Left;  Left Popliteal to Posterior Tibial Bypass Graft with reversed saphenous vein graft  . Incision and drainage Right 06/25/2013    Procedure: INCISION AND DRAINAGE RIGHT FOOT;  Surgeon: Mcarthur Rossetti, MD;   Location: [facility];  Service: Orthopedics;  Laterality: Right;  . Amputation Right 06/29/2013    Procedure: AMPUTATION BELOW KNEE;  Surgeon: Newt Minion, MD;  Location: [place];  Service: Orthopedics;  Laterality: Right;  . Tee without cardioversion [name] 07/03/2013    Procedure: TRANSESOPHAGEAL ECHOCARDIOGRAM (TEE);  Surgeon: Larey Dresser, MD;  Location: [place];  Service: Cardiovascular;  Laterality: [name];  . Stump revision Right 08/16/2013    Procedure: STUMP REVISION- right Below Knee Amputation;  Surgeon: Newt Minion, MD;  Location: [place];  Service: Orthopedics;  Laterality: Right;  . Cardiac catheterization    . Amputation Left 08/29/2014    Procedure: Left Great Toe Amputation at MTP;  Surgeon: Newt Minion, MD;  Location: [place];  Service: Orthopedics;  Laterality: Left;  . Left heart catheterization with coronary angiogram [name] 06/28/2013    Procedure: LEFT HEART CATHETERIZATION WITH CORONARY ANGIOGRAM;  Surgeon: Burnell Blanks, MD;  Location: [facility];  Service: Cardiovascular;  Laterality: [name];  . Amputation Left 10/31/2014    Procedure: Midfoot Amputation;  Surgeon: Newt Minion, MD;  Location: [place];  Service: Orthopedics;  Laterality: Left;     Allergies  Allergen Reactions  . Amoxicillin Nausea And Vomiting      Family History  Problem Relation Age of Onset  . Diabetes    . Pancreatic cancer Mother   . Diabetes Mother   . Hyperlipidemia Mother   . Breast cancer Sister   . Depression Maternal Grandmother   . Heart disease Maternal Grandmother   . Depression Maternal Grandfather   . Heart disease Maternal Grandfather   . Depression Paternal Grandmother   . Heart disease Paternal Grandmother   . Depression Paternal Grandfather   . Heart disease Paternal Grandfather      Social History Mr. Fraticelli reports that he has never smoked. He has never used smokeless tobacco. Mr. Olund reports that he does not drink alcohol.   Review of  Systems CONSTITUTIONAL: No weight loss, fever, chills, weakness or fatigue.  HEENT: Eyes: No visual loss, blurred vision, double vision or yellow sclerae.No hearing loss, sneezing, congestion, runny nose or sore throat.  SKIN: No rash or itching.  CARDIOVASCULAR: per HPI RESPIRATORY: No shortness of breath, cough or sputum.  GASTROINTESTINAL: No anorexia, nausea, vomiting or diarrhea. No abdominal pain or blood.  GENITOURINARY: No burning on urination, no polyuria NEUROLOGICAL: No headache, dizziness, syncope, paralysis, ataxia, numbness or tingling in the extremities. No change in bowel or bladder control.  MUSCULOSKELETAL: No muscle, back pain, joint pain or stiffness.  LYMPHATICS: No enlarged nodes. No history of splenectomy.  PSYCHIATRIC: No history of depression or anxiety.  ENDOCRINOLOGIC: No reports of sweating, cold or heat intolerance. No polyuria or polydipsia.  Marland Kitchen   Physical Examination p 67 bp 106/72 Wt 247 lbs BMI 34 Gen: resting comfortably, no acute distress HEENT: no scleral icterus, pupils equal round and reactive, no palptable cervical adenopathy,  CV: RRR, no m/r/g, no JVD, no carotid bruits Resp: Clear to auscultation bilaterally GI: abdomen is soft, non-tender, non-distended, normal bowel sounds, no hepatosplenomegaly MSK: extremities are warm, no edema.  Skin: warm, no rash Neuro:  no focal deficits Psych: appropriate affect   Diagnostic Studies 06/2013 Cath Hemodynamic Findings: Central aortic pressure: 90/61 Left ventricular pressure: 90/19/29  Angiographic Findings:  Left main: Short segment without obstruction.   Left Anterior Descending Artery: Moderate caliber diffusely diseased vessel that courses to the apex. The proximal vessel has diffuse 40% stenosis. The mid vessel has a 70% stenosis followed by a 60% stenosis. The distal vessel has diffuse 90% stenosis. The diagonal is a moderate caliber vessel with ostial 99% stenosis with 99% disease  extending into the mid vessel.   Circumflex Artery: Large caliber vessel with proximal 80-90% stenosis. There are three small to moderate caliber obtuse marginal branches. The first OM has mid 80% stenosis. The second OM has a proximal 99% stenosis. The third OM has diffuse 99% stenosis. The left sided posterolateral [place]ry has 99% stenosis.   Right Coronary Artery: Small non-dominant  vessel with mid 99% stenosis.   Left Ventricular Angiogram: LVEF=40%  Impression: 1. Triple vessel CAD 2. NSTEMI secondary to demand ischemia from sepsis with severe underlying CAD 3. Mild LV systolic dysfunction  Recommendations: Complex situation. The only option for coronary revascularization is CABG. He is not a favorable candidate for CABG at this time with necrotic right foot, sepsis. Even though not ideal, would favor medical management of CAD while undergoing treatment of his necrotic foot. Would proceed with right BKA this weekend. I have discussed the case with CT surgery who agrees that CABG is not a good option at this time. Will ask CT surgery to see after he recovers from his BKA. Continue ASA, beta blocker, statin.    Complications: None. The patient tolerated the procedure well.     11/2012 Carotid US Summary:  - No significant extracranial carotid artery stenosis demonstrated. Vertebrals are patent with antegrade flow. - ICA/CCA ratio= right = 0.94. left = 0.73. Other specific details can be found in the table(s) above.  Prepared and Electronically Authenticated by  Assessment and Plan  1. Chronic systolic heart failure - ICM, LVEF 40-45% by echo 12/2013 with grade II diastolic dysfunction - NYHA II. Currently appears euvolemic - further medication titration limted by soft bp's, he is at goal on beta blocker.  - continue current meds  2. CAD - triple vessel CAD managed medically. Seen by CT surgery, due to ongoing recurrent LE leg infections and multiple  surgeries, as well as overall high risk recs were for medical therapy, reconsideration if other comorbidities significanlty improved - repeat amputation Jan 2016, we will continue medical therapy for his CAD and consider setting him back up with CT surgery if other medical issues improve  3. Hyperlipidemia - continue high dose statin. Request pcp labs  4. PAD - per vascular and ortho  5. Mitral regurgitation - moderate by recent imaging, no clinical symptoms at this time - continue to follow    F/u 4 months   Arnoldo Lenis, M.D.

## 2014-12-29 ENCOUNTER — Other Ambulatory Visit (HOSPITAL_COMMUNITY): Payer: Self-pay | Admitting: Internal Medicine

## 2014-12-29 DIAGNOSIS — I2510 Atherosclerotic heart disease of native coronary artery without angina pectoris: Secondary | ICD-10-CM

## 2015-01-02 ENCOUNTER — Other Ambulatory Visit (HOSPITAL_COMMUNITY): Payer: 59

## 2015-03-20 ENCOUNTER — Ambulatory Visit (INDEPENDENT_AMBULATORY_CARE_PROVIDER_SITE_OTHER): Payer: 59 | Admitting: Urology

## 2015-03-20 ENCOUNTER — Ambulatory Visit (HOSPITAL_COMMUNITY)
Admission: RE | Admit: 2015-03-20 | Discharge: 2015-03-20 | Disposition: A | Discharge status: Home / Self Care | Payer: 59 | Source: Ambulatory Visit | Attending: Internal Medicine | Admitting: Internal Medicine

## 2015-03-20 DIAGNOSIS — N481 Balanitis: Secondary | ICD-10-CM

## 2015-03-20 DIAGNOSIS — N5314 Retrograde ejaculation: Secondary | ICD-10-CM

## 2015-03-20 DIAGNOSIS — I2510 Atherosclerotic heart disease of native coronary artery without angina pectoris: Secondary | ICD-10-CM

## 2015-03-20 DIAGNOSIS — I10 Essential (primary) hypertension: Secondary | ICD-10-CM | POA: Diagnosis not present

## 2015-04-08 ENCOUNTER — Other Ambulatory Visit: Payer: Self-pay

## 2015-04-08 MED ORDER — POTASSIUM CHLORIDE CRYS ER 10 MEQ PO TBCR: 10.0000 meq | EXTENDED_RELEASE_TABLET | Freq: Every day | ORAL | Status: AC

## 2015-04-17 ENCOUNTER — Ambulatory Visit (INDEPENDENT_AMBULATORY_CARE_PROVIDER_SITE_OTHER): Payer: Commercial Managed Care - HMO | Admitting: Urology

## 2015-04-17 DIAGNOSIS — N5314 Retrograde ejaculation: Secondary | ICD-10-CM | POA: Diagnosis not present

## 2015-04-17 DIAGNOSIS — N481 Balanitis: Secondary | ICD-10-CM

## 2015-04-24 DIAGNOSIS — F5221 Male erectile disorder: Secondary | ICD-10-CM | POA: Diagnosis not present

## 2015-04-24 DIAGNOSIS — D509 Iron deficiency anemia, unspecified: Secondary | ICD-10-CM | POA: Diagnosis not present

## 2015-04-24 DIAGNOSIS — E119 Type 2 diabetes mellitus without complications: Secondary | ICD-10-CM | POA: Diagnosis not present

## 2015-04-24 DIAGNOSIS — I25810 Atherosclerosis of coronary artery bypass graft(s) without angina pectoris: Secondary | ICD-10-CM | POA: Diagnosis not present

## 2015-05-04 ENCOUNTER — Encounter: Payer: Self-pay | Admitting: Cardiology

## 2015-05-04 ENCOUNTER — Ambulatory Visit (INDEPENDENT_AMBULATORY_CARE_PROVIDER_SITE_OTHER): Payer: Commercial Managed Care - HMO | Admitting: Cardiology

## 2015-05-04 ENCOUNTER — Encounter: Payer: Self-pay | Admitting: *Deleted

## 2015-05-04 VITALS — BP 146/84 | HR 64 | Ht 71.0 in | Wt 247.0 lb

## 2015-05-04 DIAGNOSIS — I2510 Atherosclerotic heart disease of native coronary artery without angina pectoris: Secondary | ICD-10-CM | POA: Diagnosis not present

## 2015-05-04 DIAGNOSIS — I340 Nonrheumatic mitral (valve) insufficiency: Secondary | ICD-10-CM | POA: Diagnosis not present

## 2015-05-04 DIAGNOSIS — I5022 Chronic systolic (congestive) heart failure: Secondary | ICD-10-CM | POA: Diagnosis not present

## 2015-05-04 DIAGNOSIS — E7800 Pure hypercholesterolemia, unspecified: Secondary | ICD-10-CM

## 2015-05-04 NOTE — Patient Instructions (Signed)
Your physician wants you to follow-up in 2 MONTHS WITH DR. [place]  Your physician recommends that you continue on your current medications as directed. Please refer to the Current Medication list given to you today.  WE WILL REQUEST RECORDS FROM DR. HALL  Thank you for choosing [facility]!!

## 2015-05-04 NOTE — Progress Notes (Addendum)
Patient ID: Albert [name], male   DOB: 22-Nov-1960, 54 y.o.   MRN: [id number]     Clinical Summary Albert [name] is a 54 y.o.male seen today for follow up of the following medical problems.   1. Chronic combined systolic/diastolic heart failure - ICM, LVEF 40-45% by echo 12/2013, grade II diastolic dysfunction - denies any SOB, no DOE, no LE edema.   2. Mitral regurgitation - moderate by TEE 06/2013 and by TTE 12/2013 - denies any significant symptoms  3. CAD - hx of demand ischemia 06/2013 in setting of sepsis with necrotic foot wound. - cath at that time 06/2013 LM patent, LAD 40% prox, 70% mid follow by 60% lesion, distal 90%. Diag 99%. LCX 80-90% prox, OM1 80%, OM2 99%, OM3 99 % (all OMs are small), RCA small non-dom mid 99%. LVEF 40% by LVgram.  - CAD was managed medically after discussions with CT surgery at that time, with plans to reevaluate once recurring infection issues resolved.   - denies any recent chest pain  4. PAD - followed by vascular and ortho - previous right BKA, previous multiple toe amputations. 08/30/14 treated for osteo and ulcer of left great toe with amputation. Jan 2016 had left midfoot amputation.  - has f/u with ortho later this week.   5. Hyperlipidemia - compliant with statin - Lipid panel 12/2014 TC 92 TG 108 HDL 24 LDL 46  6. HTN - does not check at home.   Past Medical History  Diagnosis Date  . Hypertension   . Hypercholesteremia   . CVA (cerebral infarction) 2011    R hand deficit  . Chronic sinusitis   . Allergic rhinitis   . Chronic cough   . Complication of anesthesia     couldn't swallow and talk  . Splenic infarct     setting of lupus anticoagulant  . Myocardial infarction   . Stroke     right arm weakness  . Pneumonia     hx of walking  . Diabetes mellitus without complication ~7124    last HbA1c ~9 .... fasting 160s  . GERD (gastroesophageal reflux disease)   . Anemia     takes supplement  . Osteomyelitis     first and  third metatarsal     Allergies  Allergen Reactions  . Amoxicillin Nausea And Vomiting     Current Outpatient Prescriptions  Medication Sig Dispense Refill  . aspirin EC 81 MG tablet Take 1 tablet (81 mg total) by mouth daily. 30 tablet 1  . atorvastatin (LIPITOR) 80 MG tablet Take 1 tablet (80 mg total) by mouth daily. 30 tablet 6  . carvedilol (COREG) 25 MG tablet Take 1.5 tablets (37.5 mg total) by mouth 2 (two) times daily. 90 tablet 6  . ferrous sulfate 325 (65 FE) MG EC tablet Take 325 mg by mouth daily with breakfast.    . furosemide (LASIX) 80 MG tablet Take 80 mg by mouth daily.    . hydrALAZINE (APRESOLINE) 10 MG tablet Take 1 tablet (10 mg total) by mouth 3 (three) times daily. 120 tablet 1  . HYDROcodone-acetaminophen (NORCO) 5-325 MG per tablet Take 1 tablet by mouth every 6 (six) hours as needed. 30 tablet 0  . insulin aspart (NOVOLOG) 100 UNIT/ML injection Inject 16 Units into the skin 3 (three) times daily before meals.     . insulin detemir (LEVEMIR) 100 UNIT/ML injection Inject 36 Units into the skin at bedtime.     Marland Kitchen KLOR-CON 10 10 MEQ  tablet Take 10 mEq by mouth daily.  3  . Loratadine-Pseudoephedrine (CLARITIN-D 12 HOUR PO) Take 1 tablet by mouth as needed (allergies).     . losartan (COZAAR) 25 MG tablet Take 1 tablet (25 mg total) by mouth daily. 30 tablet 3  . LYRICA 75 MG capsule Take 1 capsule by mouth 2 (two) times daily as needed.     . nitroGLYCERIN (NITRODUR - DOSED IN MG/24 HR) 0.2 mg/hr patch Place 0.2 mg onto the skin daily.    . pantoprazole (PROTONIX) 40 MG tablet Take 1 tablet (40 mg total) by mouth daily. 30 tablet 1  . polyethylene glycol (MIRALAX / GLYCOLAX) packet Take 17 g by mouth daily as needed for mild constipation.     . potassium chloride (K-DUR,KLOR-CON) 10 MEQ tablet Take 1 tablet (10 mEq total) by mouth daily. 30 tablet 6  . promethazine (PHENERGAN) 25 MG tablet Take 1 tab every 6-8 hours for nausea. (Patient taking differently: Take 25 mg  by mouth every 6 (six) hours as needed for nausea. Take 1 tab every 6-8 hours for nausea.) 40 tablet 1  . senna-docusate (SENOKOT-S) 8.6-50 MG per tablet Take 1 tablet by mouth daily as needed for mild constipation.     . tamsulosin (FLOMAX) 0.4 MG CAPS capsule Take 1 capsule (0.4 mg total) by mouth daily after supper. 30 capsule 1   No current facility-administered medications for this visit.     Past Surgical History  Procedure Laterality Date  . 4th toe amputation Left Jan. 2014    4th toe in [place]  . Tonsillectomy and adenoidectomy    . Penile prosthesis implant    . I&d extremity Left 12/08/2012    Procedure: IRRIGATION AND DEBRIDEMENT EXTREMITY;  Surgeon: Mcarthur Rossetti, MD;  Location: [place];  Service: Orthopedics;  Laterality: Left;  . Amputation Left 12/08/2012    Procedure: AMPUTATION DIGIT;  Surgeon: Mcarthur Rossetti, MD;  Location: [place];  Service: Orthopedics;  Laterality: Left;  3rd toe amputation possible 5th toe amputation  . I&d extremity Left 12/11/2012    Procedure: REPEAT I&D LEFT FOOT;  Surgeon: Mcarthur Rossetti, MD;  Location: [place];  Service: Orthopedics;  Laterality: Left;  . Amputation Left 12/11/2012    Procedure: Amputation of 2nd Toe;  Surgeon: Mcarthur Rossetti, MD;  Location: [place];  Service: Orthopedics;  Laterality: Left;  . Bypass graft popliteal to tibial Left 12/13/2012    Procedure: BYPASS GRAFT POPLITEAL TO TIBIAL;  Surgeon: Serafina Mitchell, MD;  [facility];  Service: Vascular;  Laterality: Left;  Left Popliteal to Posterior Tibial Bypass Graft with reversed saphenous vein graft  . Incision and drainage Right 06/25/2013    Procedure: INCISION AND DRAINAGE RIGHT FOOT;  Surgeon: Mcarthur Rossetti, MD;  Location: [facility];  Service: Orthopedics;  Laterality: Right;  . Amputation Right 06/29/2013    Procedure: AMPUTATION BELOW KNEE;  Surgeon: Newt Minion, MD;  Location: [place];  Service: Orthopedics;  Laterality: Right;  . Tee  without cardioversion [name] 07/03/2013    Procedure: TRANSESOPHAGEAL ECHOCARDIOGRAM (TEE);  Surgeon: Larey Dresser, MD;  Location: [place];  Service: Cardiovascular;  Laterality: [name];  . Stump revision Right 08/16/2013    Procedure: STUMP REVISION- right Below Knee Amputation;  Surgeon: Newt Minion, MD;  Location: [place];  Service: Orthopedics;  Laterality: Right;  . Cardiac catheterization    . Amputation Left 08/29/2014    Procedure: Left Great Toe Amputation at MTP;  Surgeon: Newt Minion, MD;  Location: [place];  Service: Orthopedics;  Laterality: Left;  . Left heart catheterization with coronary angiogram [name] 06/28/2013    Procedure: LEFT HEART CATHETERIZATION WITH CORONARY ANGIOGRAM;  Surgeon: Burnell Blanks, MD;  Location: [facility] CATH LAB;  Service: Cardiovascular;  Laterality: [name];  . Amputation Left 10/31/2014    Procedure: Midfoot Amputation;  Surgeon: Newt Minion, MD;  Location: [place];  Service: Orthopedics;  Laterality: Left;     Allergies  Allergen Reactions  . Amoxicillin Nausea And Vomiting      Family History  Problem Relation Age of Onset  . Diabetes    . Pancreatic cancer Mother   . Diabetes Mother   . Hyperlipidemia Mother   . Breast cancer Sister   . Depression Maternal Grandmother   . Heart disease Maternal Grandmother   . Depression Maternal Grandfather   . Heart disease Maternal Grandfather   . Depression Paternal Grandmother   . Heart disease Paternal Grandmother   . Depression Paternal Grandfather   . Heart disease Paternal Grandfather      Social History Albert [name] reports that he has never smoked. He has never used smokeless tobacco. Albert [name] reports that he does not drink alcohol.   Review of Systems CONSTITUTIONAL: No weight loss, fever, chills, weakness or fatigue.  HEENT: Eyes: No visual loss, blurred vision, double vision or yellow sclerae.No hearing loss, sneezing, congestion, runny nose or sore throat.  SKIN: No rash or itching.   CARDIOVASCULAR: per HPI RESPIRATORY: No shortness of breath, cough or sputum.  GASTROINTESTINAL: No anorexia, nausea, vomiting or diarrhea. No abdominal pain or blood.  GENITOURINARY: No burning on urination, no polyuria NEUROLOGICAL: No headache, dizziness, syncope, paralysis, ataxia, numbness or tingling in the extremities. No change in bowel or bladder control.  MUSCULOSKELETAL: No muscle, back pain, joint pain or stiffness.  LYMPHATICS: No enlarged nodes. No history of splenectomy.  PSYCHIATRIC: No history of depression or anxiety.  ENDOCRINOLOGIC: No reports of sweating, cold or heat intolerance. No polyuria or polydipsia.  Marland Kitchen   Physical Examination Filed Vitals:   05/04/15 1609  BP: 146/84  Pulse: 64   Filed Vitals:   05/04/15 1609  Height: 5\' 11"  (1.803 m)  Weight: 247 lb (112.038 kg)    Gen: resting comfortably, no acute distress HEENT: no scleral icterus, pupils equal round and reactive, no palptable cervical adenopathy,  CV: RRR, no m/r/g, no JVD Resp: Clear to auscultation bilaterally GI: abdomen is soft, non-tender, non-distended, normal bowel sounds, no hepatosplenomegaly MSK: extremities are warm, no edema.  Skin: warm, no rash Neuro:  no focal deficits Psych: appropriate affect   Diagnostic Studies  06/2013 Cath Hemodynamic Findings: Central aortic pressure: 90/61 Left ventricular pressure: 90/19/29  Angiographic Findings:  Left main: Short segment without obstruction.   Left Anterior Descending Artery: Moderate caliber diffusely diseased vessel that courses to the apex. The proximal vessel has diffuse 40% stenosis. The mid vessel has a 70% stenosis followed by a 60% stenosis. The distal vessel has diffuse 90% stenosis. The diagonal is a moderate caliber vessel with ostial 99% stenosis with 99% disease extending into the mid vessel.   Circumflex Artery: Large caliber vessel with proximal 80-90% stenosis. There are three small to moderate caliber obtuse  marginal branches. The first OM has mid 80% stenosis. The second OM has a proximal 99% stenosis. The third OM has diffuse 99% stenosis. The left sided posterolateral Intisar Claudio has 99% stenosis.   Right Coronary Artery: Small non-dominant  vessel with mid 99% stenosis.   Left Ventricular Angiogram: LVEF=40%  Impression: 1. Triple vessel CAD 2. NSTEMI secondary to demand ischemia from sepsis with severe underlying CAD 3. Mild LV systolic dysfunction  Recommendations: Complex situation. The only option for coronary revascularization is CABG. He is not a favorable candidate for CABG at this time with necrotic right foot, sepsis. Even though not ideal, would favor medical management of CAD while undergoing treatment of his necrotic foot. Would proceed with right BKA this weekend. I have discussed the case with CT surgery who agrees that CABG is not a good option at this time. Will ask CT surgery to see after he recovers from his BKA. Continue ASA, beta blocker, statin.    Complications: None. The patient tolerated the procedure well.     11/2012 Carotid US Summary:  - No significant extracranial carotid artery stenosis demonstrated. Vertebrals are patent with antegrade flow. - ICA/CCA ratio= right = 0.94. left = 0.73. Other specific details can be found in the table(s) above.  Prepared and Electronically Authenticated by  03/2015 Carotid US IMPRESSION: Mild atherosclerotic disease in the carotid arteries, right side greater the left. The peak systolic velocity in the proximal right internal carotid artery is slightly elevated. Estimated degree of stenosis in the right internal carotid artery is close to 50%.  Estimated degree of stenosis in the left internal carotid artery is less than 50%.  Patent vertebral arteries.   Assessment and Plan  1. Chronic systolic heart failure - ICM, LVEF 40-45% by echo 12/2013 with grade II diastolic dysfunction - NYHA II. Currently  appears euvolemic -  Continue current meds  2. CAD - triple vessel CAD managed medically. Seen by CT surgery, due to ongoing recurrent LE leg infections and multiple surgeries, as well as overall high risk recs were for medical therapy, reconsideration if other comorbidities significanlty improved - repeat amputation Jan 2016, we will continue medical therapy for his CAD and consider setting him back up with CT surgery if other medical issues improve  3. Hyperlipidemia - continue high dose statin.  4. PAD - per vascular and ortho  5. Mitral regurgitation - moderate by recent imaging, no clinical symptoms at this time - continue to follow  6. HTN - above goal based on his history of DM2. Last visit systolics in low 240X, historically have been in that range. Will follow bp next visit, if persistently elevated will tirtate up ARB.   F/u 2 months     Arnoldo Lenis, M.D.

## 2015-05-08 DIAGNOSIS — E1142 Type 2 diabetes mellitus with diabetic polyneuropathy: Secondary | ICD-10-CM | POA: Diagnosis not present

## 2015-05-08 DIAGNOSIS — M86272 Subacute osteomyelitis, left ankle and foot: Secondary | ICD-10-CM | POA: Diagnosis not present

## 2015-05-08 DIAGNOSIS — L97522 Non-pressure chronic ulcer of other part of left foot with fat layer exposed: Secondary | ICD-10-CM | POA: Diagnosis not present

## 2015-05-08 DIAGNOSIS — L02416 Cutaneous abscess of left lower limb: Secondary | ICD-10-CM | POA: Diagnosis not present

## 2015-05-08 DIAGNOSIS — Z89432 Acquired absence of left foot: Secondary | ICD-10-CM | POA: Diagnosis not present

## 2015-05-16 ENCOUNTER — Inpatient Hospital Stay (HOSPITAL_COMMUNITY)
Admission: EM | Admit: 2015-05-16 | Discharge: 2015-05-19 | DRG: 603 | Disposition: A | Discharge status: Home / Self Care | Payer: Commercial Managed Care - HMO | Attending: Internal Medicine | Admitting: Internal Medicine

## 2015-05-16 ENCOUNTER — Encounter (HOSPITAL_COMMUNITY): Payer: Self-pay | Admitting: *Deleted

## 2015-05-16 DIAGNOSIS — Z8673 Personal history of transient ischemic attack (TIA), and cerebral infarction without residual deficits: Secondary | ICD-10-CM | POA: Diagnosis not present

## 2015-05-16 DIAGNOSIS — E1369 Other specified diabetes mellitus with other specified complication: Secondary | ICD-10-CM | POA: Diagnosis present

## 2015-05-16 DIAGNOSIS — I10 Essential (primary) hypertension: Secondary | ICD-10-CM | POA: Diagnosis present

## 2015-05-16 DIAGNOSIS — L0390 Cellulitis, unspecified: Secondary | ICD-10-CM | POA: Diagnosis present

## 2015-05-16 DIAGNOSIS — I70209 Unspecified atherosclerosis of native arteries of extremities, unspecified extremity: Secondary | ICD-10-CM | POA: Diagnosis not present

## 2015-05-16 DIAGNOSIS — I6529 Occlusion and stenosis of unspecified carotid artery: Secondary | ICD-10-CM | POA: Diagnosis present

## 2015-05-16 DIAGNOSIS — I5042 Chronic combined systolic (congestive) and diastolic (congestive) heart failure: Secondary | ICD-10-CM | POA: Diagnosis not present

## 2015-05-16 DIAGNOSIS — I2510 Atherosclerotic heart disease of native coronary artery without angina pectoris: Secondary | ICD-10-CM | POA: Diagnosis not present

## 2015-05-16 DIAGNOSIS — E1151 Type 2 diabetes mellitus with diabetic peripheral angiopathy without gangrene: Secondary | ICD-10-CM | POA: Diagnosis present

## 2015-05-16 DIAGNOSIS — L97429 Non-pressure chronic ulcer of left heel and midfoot with unspecified severity: Secondary | ICD-10-CM | POA: Diagnosis not present

## 2015-05-16 DIAGNOSIS — Z89511 Acquired absence of right leg below knee: Secondary | ICD-10-CM

## 2015-05-16 DIAGNOSIS — E785 Hyperlipidemia, unspecified: Secondary | ICD-10-CM | POA: Diagnosis present

## 2015-05-16 DIAGNOSIS — I70244 Atherosclerosis of native arteries of left leg with ulceration of heel and midfoot: Secondary | ICD-10-CM | POA: Diagnosis not present

## 2015-05-16 DIAGNOSIS — I252 Old myocardial infarction: Secondary | ICD-10-CM | POA: Diagnosis not present

## 2015-05-16 DIAGNOSIS — R609 Edema, unspecified: Secondary | ICD-10-CM

## 2015-05-16 DIAGNOSIS — E1165 Type 2 diabetes mellitus with hyperglycemia: Secondary | ICD-10-CM | POA: Diagnosis present

## 2015-05-16 DIAGNOSIS — N179 Acute kidney failure, unspecified: Secondary | ICD-10-CM | POA: Diagnosis present

## 2015-05-16 DIAGNOSIS — L03116 Cellulitis of left lower limb: Principal | ICD-10-CM | POA: Diagnosis present

## 2015-05-16 DIAGNOSIS — Z89521 Acquired absence of right knee: Secondary | ICD-10-CM | POA: Diagnosis not present

## 2015-05-16 DIAGNOSIS — L02416 Cutaneous abscess of left lower limb: Secondary | ICD-10-CM | POA: Diagnosis not present

## 2015-05-16 DIAGNOSIS — IMO0002 Reserved for concepts with insufficient information to code with codable children: Secondary | ICD-10-CM | POA: Diagnosis present

## 2015-05-16 DIAGNOSIS — A419 Sepsis, unspecified organism: Secondary | ICD-10-CM | POA: Diagnosis not present

## 2015-05-16 LAB — I-STAT CG4 LACTIC ACID, ED: Lactic Acid, Venous: 1.29 mmol/L (ref 0.5–2.0)

## 2015-05-16 LAB — CBC WITH DIFFERENTIAL/PLATELET
BASOS ABS: 0 10*3/uL (ref 0.0–0.1)
Basophils Relative: 0 % (ref 0–1)
EOS ABS: 0.2 10*3/uL (ref 0.0–0.7)
Eosinophils Relative: 2 % (ref 0–5)
HCT: 36.5 % — ABNORMAL LOW (ref 39.0–52.0)
Hemoglobin: 12 g/dL — ABNORMAL LOW (ref 13.0–17.0)
Lymphocytes Relative: 16 % (ref 12–46)
Lymphs Abs: 1.6 10*3/uL (ref 0.7–4.0)
MCH: 26.7 pg (ref 26.0–34.0)
MCHC: 32.9 g/dL (ref 30.0–36.0)
MCV: 81.3 fL (ref 78.0–100.0)
Monocytes Absolute: 0.8 10*3/uL (ref 0.1–1.0)
Monocytes Relative: 8 % (ref 3–12)
Neutro Abs: 7.2 10*3/uL (ref 1.7–7.7)
Neutrophils Relative %: 74 % (ref 43–77)
PLATELETS: 207 10*3/uL (ref 150–400)
RBC: 4.49 MIL/uL (ref 4.22–5.81)
RDW: 13.7 % (ref 11.5–15.5)
WBC: 9.8 10*3/uL (ref 4.0–10.5)

## 2015-05-16 LAB — I-STAT CHEM 8, ED
BUN: 27 mg/dL — ABNORMAL HIGH (ref 6–20)
CREATININE: 1.4 mg/dL — AB (ref 0.61–1.24)
Calcium, Ion: 1.15 mmol/L (ref 1.12–1.23)
Chloride: 103 mmol/L (ref 101–111)
Glucose, Bld: 269 mg/dL — ABNORMAL HIGH (ref 65–99)
HEMATOCRIT: 39 % (ref 39.0–52.0)
Hemoglobin: 13.3 g/dL (ref 13.0–17.0)
POTASSIUM: 3.9 mmol/L (ref 3.5–5.1)
SODIUM: 140 mmol/L (ref 135–145)
TCO2: 20 mmol/L (ref 0–100)

## 2015-05-16 LAB — GLUCOSE, CAPILLARY: Glucose-Capillary: 211 mg/dL — ABNORMAL HIGH (ref 65–99)

## 2015-05-16 MED ORDER — ACETAMINOPHEN 650 MG RE SUPP
650.0000 mg | Freq: Four times a day (QID) | RECTAL | Status: DC | PRN
Start: 2015-05-16 — End: 2015-05-19

## 2015-05-16 MED ORDER — PIPERACILLIN-TAZOBACTAM 3.375 G IVPB
3.3750 g | Freq: Three times a day (TID) | INTRAVENOUS | Status: DC
  Administered 2015-05-17 – 2015-05-19 (×7): 3.375 g via INTRAVENOUS
  Filled 2015-05-16 (×11): qty 50

## 2015-05-16 MED ORDER — CLINDAMYCIN PHOSPHATE 900 MG/50ML IV SOLN
900.0000 mg | Freq: Once | INTRAVENOUS | Status: AC
  Administered 2015-05-16: 900 mg via INTRAVENOUS
  Filled 2015-05-16: qty 50

## 2015-05-16 MED ORDER — HYDROCODONE-ACETAMINOPHEN 5-325 MG PO TABS
1.0000 | ORAL_TABLET | ORAL | Status: DC | PRN
  Administered 2015-05-16 – 2015-05-17 (×3): 1 via ORAL
  Filled 2015-05-16 (×3): qty 1

## 2015-05-16 MED ORDER — CARVEDILOL 25 MG PO TABS
37.5000 mg | ORAL_TABLET | Freq: Two times a day (BID) | ORAL | Status: DC
  Administered 2015-05-16 – 2015-05-19 (×6): 37.5 mg via ORAL
  Filled 2015-05-16 (×12): qty 1

## 2015-05-16 MED ORDER — PROMETHAZINE HCL 25 MG PO TABS: 25.0000 mg | ORAL_TABLET | Freq: Four times a day (QID) | ORAL | Status: DC | PRN

## 2015-05-16 MED ORDER — POLYETHYLENE GLYCOL 3350 17 G PO PACK: 17.0000 g | PACK | Freq: Every day | ORAL | Status: DC | PRN

## 2015-05-16 MED ORDER — ATORVASTATIN CALCIUM 80 MG PO TABS
80.0000 mg | ORAL_TABLET | Freq: Every day | ORAL | Status: DC
  Administered 2015-05-17 – 2015-05-18 (×2): 80 mg via ORAL
  Filled 2015-05-16 (×2): qty 1

## 2015-05-16 MED ORDER — PIPERACILLIN-TAZOBACTAM 3.375 G IVPB 30 MIN
3.3750 g | Freq: Once | INTRAVENOUS | Status: AC
  Administered 2015-05-16: 3.375 g via INTRAVENOUS

## 2015-05-16 MED ORDER — SODIUM CHLORIDE 0.9 % IV SOLN
INTRAVENOUS | Status: DC
  Administered 2015-05-16 – 2015-05-18 (×3): via INTRAVENOUS
  Administered 2015-05-19: 100 mL/h via INTRAVENOUS

## 2015-05-16 MED ORDER — INSULIN ASPART 100 UNIT/ML ~~LOC~~ SOLN
5.0000 [IU] | Freq: Three times a day (TID) | SUBCUTANEOUS | Status: DC
  Administered 2015-05-17 – 2015-05-19 (×7): 5 [IU] via SUBCUTANEOUS

## 2015-05-16 MED ORDER — ACETAMINOPHEN 325 MG PO TABS: 650.0000 mg | ORAL_TABLET | Freq: Four times a day (QID) | ORAL | Status: DC | PRN

## 2015-05-16 MED ORDER — ONDANSETRON HCL 4 MG/2ML IJ SOLN: 4.0000 mg | Freq: Four times a day (QID) | INTRAMUSCULAR | Status: DC | PRN

## 2015-05-16 MED ORDER — ASPIRIN EC 81 MG PO TBEC
81.0000 mg | DELAYED_RELEASE_TABLET | Freq: Every day | ORAL | Status: DC
  Administered 2015-05-17 – 2015-05-19 (×3): 81 mg via ORAL
  Filled 2015-05-16 (×3): qty 1

## 2015-05-16 MED ORDER — ONDANSETRON HCL 4 MG PO TABS
4.0000 mg | ORAL_TABLET | Freq: Four times a day (QID) | ORAL | Status: DC | PRN
Start: 2015-05-16 — End: 2015-05-19

## 2015-05-16 MED ORDER — VANCOMYCIN HCL 10 G IV SOLR
2000.0000 mg | Freq: Once | INTRAVENOUS | Status: AC
  Administered 2015-05-16: 2000 mg via INTRAVENOUS
  Filled 2015-05-16: qty 2000

## 2015-05-16 MED ORDER — ENOXAPARIN SODIUM 40 MG/0.4ML ~~LOC~~ SOLN
40.0000 mg | SUBCUTANEOUS | Status: DC
  Administered 2015-05-16 – 2015-05-18 (×3): 40 mg via SUBCUTANEOUS
  Filled 2015-05-16 (×3): qty 0.4

## 2015-05-16 MED ORDER — SENNOSIDES-DOCUSATE SODIUM 8.6-50 MG PO TABS: 1.0000 | ORAL_TABLET | Freq: Every day | ORAL | Status: DC | PRN

## 2015-05-16 MED ORDER — SODIUM CHLORIDE 0.9 % IV BOLUS (SEPSIS)
1000.0000 mL | Freq: Once | INTRAVENOUS | Status: AC
Start: 2015-05-16 — End: 2015-05-16
  Administered 2015-05-16: 1000 mL via INTRAVENOUS

## 2015-05-16 MED ORDER — PANTOPRAZOLE SODIUM 40 MG PO TBEC
40.0000 mg | DELAYED_RELEASE_TABLET | Freq: Every day | ORAL | Status: DC
  Administered 2015-05-17 – 2015-05-19 (×3): 40 mg via ORAL
  Filled 2015-05-16 (×3): qty 1

## 2015-05-16 MED ORDER — INSULIN DETEMIR 100 UNIT/ML ~~LOC~~ SOLN
36.0000 [IU] | Freq: Every day | SUBCUTANEOUS | Status: DC
  Administered 2015-05-16: 36 [IU] via SUBCUTANEOUS
  Filled 2015-05-16 (×2): qty 0.36

## 2015-05-16 MED ORDER — VANCOMYCIN HCL IN DEXTROSE 750-5 MG/150ML-% IV SOLN
750.0000 mg | Freq: Two times a day (BID) | INTRAVENOUS | Status: DC
  Administered 2015-05-17 – 2015-05-19 (×5): 750 mg via INTRAVENOUS
  Filled 2015-05-16 (×7): qty 150

## 2015-05-16 NOTE — ED Provider Notes (Signed)
History   Chief Complaint  Patient presents with  . Leg Pain    HPI 54 year old male past history as below notable for diabetes, peripheral artery disease status post right BKA, status post left partial foot amputation who presents to ED with left shin pain and redness for the past 3 days. Patient reports having subjective fevers and chills during this time. Also reports having mild nausea but denies vomiting. Denies calf pain or pain in his leg elsewhere aside from the area of redness which is on the anterior portion of his shin. Denies chest pain, shortness of breath. Patient says his taking all his medications as instructed. Patient notes having a chronic ulcer to his heel but does not know any pain or redness surrounding this area. Onset was gradual. Sxs are progressively worsening. Pain is rated as moderate. Patient was prescribed Keflex by his orthopedist (who called in rx) for this on [place] and has been taking it but his symptoms have been worsened. No other complaints at this time.   Past medical/surgical history, social history, medications, allergies and FH have been reviewed with patient and/or in documentation. Furthermore, if pt family or friend(s) present, additional historical information was obtained from them.  Past Medical History  Diagnosis Date  . Hypertension   . Hypercholesteremia   . CVA (cerebral infarction) 2011    R hand deficit  . Chronic sinusitis   . Allergic rhinitis   . Chronic cough   . Complication of anesthesia     couldn't swallow and talk  . Splenic infarct     setting of lupus anticoagulant  . Myocardial infarction   . Stroke     right arm weakness  . Pneumonia     hx of walking  . Diabetes mellitus without complication ~1829    last HbA1c ~9 .... fasting 160s  . GERD (gastroesophageal reflux disease)   . Anemia     takes supplement  . Osteomyelitis     first and third metatarsal   Past Surgical History  Procedure Laterality Date  . 4th  toe amputation Left Jan. 2014    4th toe in [place]  . Tonsillectomy and adenoidectomy    . Penile prosthesis implant    . I&d extremity Left 12/08/2012    Procedure: IRRIGATION AND DEBRIDEMENT EXTREMITY;  Surgeon: Mcarthur Rossetti, MD;  Location: [place];  Service: Orthopedics;  Laterality: Left;  . Amputation Left 12/08/2012    Procedure: AMPUTATION DIGIT;  Surgeon: Mcarthur Rossetti, MD;  Location: [place];  Service: Orthopedics;  Laterality: Left;  3rd toe amputation possible 5th toe amputation  . I&d extremity Left 12/11/2012    Procedure: REPEAT I&D LEFT FOOT;  Surgeon: Mcarthur Rossetti, MD;  Location: [place];  Service: Orthopedics;  Laterality: Left;  . Amputation Left 12/11/2012    Procedure: Amputation of 2nd Toe;  Surgeon: Mcarthur Rossetti, MD;  Location: [place];  Service: Orthopedics;  Laterality: Left;  . Bypass graft popliteal to tibial Left 12/13/2012    Procedure: BYPASS GRAFT POPLITEAL TO TIBIAL;  Surgeon: Serafina Mitchell, MD;  [facility];  Service: Vascular;  Laterality: Left;  Left Popliteal to Posterior Tibial Bypass Graft with reversed saphenous vein graft  . Incision and drainage Right 06/25/2013    Procedure: INCISION AND DRAINAGE RIGHT FOOT;  Surgeon: Mcarthur Rossetti, MD;  Location: [facility];  Service: Orthopedics;  Laterality: Right;  . Amputation Right 06/29/2013    Procedure: AMPUTATION BELOW KNEE;  Surgeon: Beverely Low  Fernanda Drum, MD;  Location: [place];  Service: Orthopedics;  Laterality: Right;  . Tee without cardioversion [name] 07/03/2013    Procedure: TRANSESOPHAGEAL ECHOCARDIOGRAM (TEE);  Surgeon: Larey Dresser, MD;  Location: [place];  Service: Cardiovascular;  Laterality: [name];  . Stump revision Right 08/16/2013    Procedure: STUMP REVISION- right Below Knee Amputation;  Surgeon: Newt Minion, MD;  Location: [place];  Service: Orthopedics;  Laterality: Right;  . Cardiac catheterization    . Amputation Left 08/29/2014    Procedure: Left Great Toe  Amputation at MTP;  Surgeon: Newt Minion, MD;  Location: [place];  Service: Orthopedics;  Laterality: Left;  . Left heart catheterization with coronary angiogram [name] 06/28/2013    Procedure: LEFT HEART CATHETERIZATION WITH CORONARY ANGIOGRAM;  Surgeon: Burnell Blanks, MD;  Location: [facility] CATH LAB;  Service: Cardiovascular;  Laterality: [name];  . Amputation Left 10/31/2014    Procedure: Midfoot Amputation;  Surgeon: Newt Minion, MD;  Location: [place];  Service: Orthopedics;  Laterality: Left;   Family History  Problem Relation Age of Onset  . Diabetes    . Pancreatic cancer Mother   . Diabetes Mother   . Hyperlipidemia Mother   . Breast cancer Sister   . Depression Maternal Grandmother   . Heart disease Maternal Grandmother   . Depression Maternal Grandfather   . Heart disease Maternal Grandfather   . Depression Paternal Grandmother   . Heart disease Paternal Grandmother   . Depression Paternal Grandfather   . Heart disease Paternal Grandfather    History  Substance Use Topics  . Smoking status: Never Smoker   . Smokeless tobacco: Never Used  . Alcohol Use: No     Review of Systems Constitutional: - F/C, -fatigue.  HENT: - congestion, -rhinorrhea, -sore throat.   Eyes: - eye pain, -visual disturbance.  Respiratory: - cough, -SOB, -hemoptysis.   Cardiovascular: - CP, -palps.  Gastrointestinal: - N/V/D, -abd pain  Genitourinary: - flank pain, -dysuria, -frequency.  Musculoskeletal: - myalgia/arthritis, -joint swelling, -gait abnormality, -back pain, -neck pain/stiffness, + leg pain/swelling.  Skin: + rash  Neurological: - focal weakness, -lightheadedness, -dizziness, -numbness, -HA.  All other systems reviewed and are negative.   Physical Exam  Physical Exam  ED Triage Vitals  Enc Vitals Group     BP 05/16/15 1506 106/60 mmHg     Pulse Rate 05/16/15 1506 82     Resp 05/16/15 1506 16     Temp 05/16/15 1506 97.7 F (36.5 C)     Temp Source 05/16/15 1506 Oral      SpO2 05/16/15 1506 97 %     Weight 05/16/15 1506 250 lb (113.399 kg)     Height --      Head Cir --      Peak Flow --      Pain Score 05/16/15 1541 4     Pain Loc --      Pain Edu? --      Excl. in [place]? --    Constitutional: Patient is well appearing and in no acute distress Head: Normocephalic and atraumatic.  Eyes: Extraocular motion intact, no scleral icterus Mouth: MMM, OP clear Neck: Supple without meningismus, mass, or overt JVD Respiratory: No respiratory distress. Normal WOB. No w/r/g. CV: RRR, no obvious murmurs.  Pulses +2 and symmetric. Euvolemic Abdomen: Soft, NT, ND, no r/g. No mass.  MSK: Extremities are atraumatic without deformity, ROM intact. Left anterior shin is erythematous and tender to palpation. Patient has  no tenderness to the posterior calf. Patient is neurovascular intact distally. No crepitus. R BKA. Skin: Warm, dry, intact without rash Neuro: AAOx4, MAE 5/5 sym, no focal deficit noted   ED Course  Procedures   Labs Reviewed  CBC WITH DIFFERENTIAL/PLATELET - Abnormal; Notable for the following:    Hemoglobin 12.0 (*)    HCT 36.5 (*)    All other components within normal limits  GLUCOSE, CAPILLARY - Abnormal; Notable for the following:    Glucose-Capillary 211 (*)    All other components within normal limits  I-STAT CHEM 8, ED - Abnormal; Notable for the following:    BUN 27 (*)    Creatinine, Ser 1.40 (*)    Glucose, Bld 269 (*)    All other components within normal limits  CULTURE, BLOOD (ROUTINE X 2)  CULTURE, BLOOD (ROUTINE X 2)  HEMOGLOBIN A1C  CBC  COMPREHENSIVE METABOLIC PANEL  I-STAT CG4 LACTIC ACID, ED  I-STAT CG4 LACTIC ACID, ED   I personally reviewed and interpreted all labs.  No orders to display   I personally viewed above image(s) which were used in my medical decision making. Formal interpretations by Radiology.  MDM: Albert [name] is a 54 y.o. male with H&P as above who p/w CC: Left shin redness and pain.  Patient is  well appearing on arrival and nontoxic. Hemodynamically stable and in no apparent distress. Patient is afebrile. Patient cellulitis of his left anterior shin. No findings to raise c/f DVT at this time. Patient is given IV clindamycin and screening labs were sent. Patient will require admission given his extensive past medical history such as diabetes and peripheral artery disease. Patient remained stable on ED.  Old records reviewed (if available). Labs and imaging reviewed personally by myself and considered in medical decision making if ordered. Clinical Impression: 1. Cellulitis of left leg     Disposition: admit  Condition: Good  I have discussed the results, Dx and Tx plan with the pt(& family if present). He/she/they expressed understanding and agree(s) with the plan.  Pt seen in conjunction with Dr. Thresa Ross, [place] Emergency Medicine Resident - PGY-3      Kirstie Peri, MD 05/17/15 3009  Elnora Morrison, MD 05/19/15 [phone]

## 2015-05-16 NOTE — ED Notes (Signed)
Leg marked with purple pen surrounding the redness lower leg pain

## 2015-05-16 NOTE — Progress Notes (Addendum)
Pt admitted to room 519 from ed. Alert and oriented x 4 pt has right lower leg prothesis, walks with a straight cane. left lower leg red and swollen . Vss. cbg 211. Iv patent. Safety information given to patient. Pt. Verbalized understanding of safety instructions.

## 2015-05-16 NOTE — ED Notes (Signed)
Pt reports healing incision site from October. Pt states that he has had chills and reddness to left leg.

## 2015-05-16 NOTE — ED Notes (Signed)
Report given to rn on 5n 

## 2015-05-16 NOTE — ED Notes (Signed)
Diabetic bk amp rt leg  Lt toes  All  remoived kin the lkt fooit.  Surgery nov 2015.  Lt lower leg red and painful for 2 days.  Pt alert no distress.  Iv and iv meds hung

## 2015-05-16 NOTE — ED Notes (Signed)
Admitting doctor at the bedside 

## 2015-05-16 NOTE — Progress Notes (Signed)
ANTIBIOTIC CONSULT NOTE - INITIAL  Pharmacy Consult for vancomycin + zosyn Indication: wound infxn  Allergies  Allergen Reactions  . Amoxicillin Nausea And Vomiting    Patient Measurements: Weight: 250 lb (113.399 kg)  Vital Signs: Temp: 97.7 F (36.5 C) (07/30 1506) Temp Source: Oral (07/30 1506) BP: 129/60 mmHg (07/30 1815) Pulse Rate: 81 (07/30 1815) Intake/Output from previous day:   Intake/Output from this shift:    Labs:  Recent Labs  05/16/15 1713 05/16/15 1728  WBC 9.8  --   HGB 12.0* 13.3  PLT 207  --   CREATININE  --  1.40*   Estimated Creatinine Clearance: 79 mL/min (by C-G formula based on Cr of 1.4). No results for input(s): VANCOTROUGH, VANCOPEAK, VANCORANDOM, GENTTROUGH, GENTPEAK, GENTRANDOM, TOBRATROUGH, TOBRAPEAK, TOBRARND, AMIKACINPEAK, AMIKACINTROU, AMIKACIN in the last 72 hours.   Microbiology: No results found for this or any previous visit (from the past 720 hour(s)).  Medical History: Past Medical History  Diagnosis Date  . Hypertension   . Hypercholesteremia   . CVA (cerebral infarction) 2011    R hand deficit  . Chronic sinusitis   . Allergic rhinitis   . Chronic cough   . Complication of anesthesia     couldn't swallow and talk  . Splenic infarct     setting of lupus anticoagulant  . Myocardial infarction   . Stroke     right arm weakness  . Pneumonia     hx of walking  . Diabetes mellitus without complication ~2297    last HbA1c ~9 .... fasting 160s  . GERD (gastroesophageal reflux disease)   . Anemia     takes supplement  . Osteomyelitis     first and third metatarsal    Medications:  Anti-infectives    Start     Dose/Rate Route Frequency Ordered Stop   05/17/15 0800  vancomycin (VANCOCIN) IVPB 750 mg/150 ml premix     750 mg 150 mL/hr over 60 Minutes Intravenous Every 12 hours 05/16/15 1831     05/17/15 0100  piperacillin-tazobactam (ZOSYN) IVPB 3.375 g     3.375 g 12.5 mL/hr over 240 Minutes Intravenous Every 8  hours 05/16/15 1831     05/16/15 1845  piperacillin-tazobactam (ZOSYN) IVPB 3.375 g     3.375 g 100 mL/hr over 30 Minutes Intravenous  Once 05/16/15 1830     05/16/15 1845  vancomycin (VANCOCIN) 2,000 mg in sodium chloride 0.9 % 500 mL IVPB     2,000 mg 250 mL/hr over 120 Minutes Intravenous  Once 05/16/15 1830     05/16/15 1715  clindamycin (CLEOCIN) IVPB 900 mg     900 mg 100 mL/hr over 30 Minutes Intravenous  Once 05/16/15 1706 05/16/15 1829     Assessment: 11 yom presented to the ED with leg pain x 3 days. To start broad-spectrum antibiotics with vancomycin and zosyn for a wound infection. Pt is afebrile and WBC is WNL. Scr is slightly elevated at 1.4. Of note, pt has an amoxicillin intolerance but has tolerated zosyn in the past.   Vanc 7/30>> Zosyn 7/30>>  Goal of Therapy:  Vancomycin trough level 15-20 mcg/ml  Plan:  - Vancomycin 2gm IV x 1 then 750mg  IV Q12H - Zosyn 3.375gm IV Q8H (4 hr inf) - F/u renal fxn, C&S, clinical status and trough at [place], Rande Lawman 05/16/2015,6:31 PM

## 2015-05-16 NOTE — ED Notes (Signed)
Old stroke causing rt arm weakness.  Fine motor skills less

## 2015-05-16 NOTE — H&P (Signed)
[facility] History and Physical  Albert [name]:[id number] DOB: November 05, 1960 DOA: 05/16/2015  Referring physician: EDP PCP: Delphina Cahill, MD   Chief Complaint: Fevers, chills, left lower extremity redness and swelling  HPI: Albert [name] is a 54 y.o. male with PMH of DM, PAD, s/p R BKA and L mid foot amputation, multivessel CAD, ischemic cardiomyopathy EF 40-45%, HTN, Dyslipidemia Presents to the ER with the above complaints. Patient reports being in his usual state of health until Thursday. Through the day on Thursday he felt poorly was experiencing chills felt tired and sluggish and also slightly feverish he was concerned about small healed wound and call Dr. Sharol Given who called in some Keflex which she started Thursday night, later Friday evening he noticed redness pain and swelling involving his left lower extremity.  Hence got concerned last night and presented to the ER today. In the ER , he is afebrile, labs unremarkable except for mild AK I   Review of Systems: Positives bolded Constitutional:  No weight loss, night sweats, Fevers, chills, fatigue.  HEENT:  No headaches, Difficulty swallowing,Tooth/dental problems,Sore throat,  No sneezing, itching, ear ache, nasal congestion, post nasal drip,  Cardio-vascular:  No chest pain, Orthopnea, PND, swelling in lower extremities, anasarca, dizziness, palpitations  GI:  No heartburn, indigestion, abdominal pain, nausea, vomiting, diarrhea, change in bowel habits, loss of appetite  Resp:  No shortness of breath with exertion or at rest. No excess mucus, no productive cough, No non-productive cough, No coughing up of blood.No change in color of mucus.No wheezing.No chest wall deformity  Skin:  no rash or lesions.  GU:  no dysuria, change in color of urine, no urgency or frequency. No flank pain.  Musculoskeletal:  No joint pain or swelling. No decreased range of motion. No back pain.  Psych:  No change in mood or affect. No  depression or anxiety. No memory loss.   Past Medical History  Diagnosis Date  . Hypertension   . Hypercholesteremia   . CVA (cerebral infarction) 2011    R hand deficit  . Chronic sinusitis   . Allergic rhinitis   . Chronic cough   . Complication of anesthesia     couldn't swallow and talk  . Splenic infarct     setting of lupus anticoagulant  . Myocardial infarction   . Stroke     right arm weakness  . Pneumonia     hx of walking  . Diabetes mellitus without complication ~5400    last HbA1c ~9 .... fasting 160s  . GERD (gastroesophageal reflux disease)   . Anemia     takes supplement  . Osteomyelitis     first and third metatarsal   Past Surgical History  Procedure Laterality Date  . 4th toe amputation Left Jan. 2014    4th toe in [place]  . Tonsillectomy and adenoidectomy    . Penile prosthesis implant    . I&d extremity Left 12/08/2012    Procedure: IRRIGATION AND DEBRIDEMENT EXTREMITY;  Surgeon: Mcarthur Rossetti, MD;  Location: [place];  Service: Orthopedics;  Laterality: Left;  . Amputation Left 12/08/2012    Procedure: AMPUTATION DIGIT;  Surgeon: Mcarthur Rossetti, MD;  Location: [place];  Service: Orthopedics;  Laterality: Left;  3rd toe amputation possible 5th toe amputation  . I&d extremity Left 12/11/2012    Procedure: REPEAT I&D LEFT FOOT;  Surgeon: Mcarthur Rossetti, MD;  Location: [place];  Service: Orthopedics;  Laterality: Left;  . Amputation Left 12/11/2012  Procedure: Amputation of 2nd Toe;  Surgeon: Mcarthur Rossetti, MD;  Location: [place];  Service: Orthopedics;  Laterality: Left;  . Bypass graft popliteal to tibial Left 12/13/2012    Procedure: BYPASS GRAFT POPLITEAL TO TIBIAL;  Surgeon: Serafina Mitchell, MD;  [facility];  Service: Vascular;  Laterality: Left;  Left Popliteal to Posterior Tibial Bypass Graft with reversed saphenous vein graft  . Incision and drainage Right 06/25/2013    Procedure: INCISION AND DRAINAGE RIGHT FOOT;  Surgeon:  Mcarthur Rossetti, MD;  Location: [facility];  Service: Orthopedics;  Laterality: Right;  . Amputation Right 06/29/2013    Procedure: AMPUTATION BELOW KNEE;  Surgeon: Newt Minion, MD;  Location: [place];  Service: Orthopedics;  Laterality: Right;  . Tee without cardioversion [name] 07/03/2013    Procedure: TRANSESOPHAGEAL ECHOCARDIOGRAM (TEE);  Surgeon: Larey Dresser, MD;  Location: [place];  Service: Cardiovascular;  Laterality: [name];  . Stump revision Right 08/16/2013    Procedure: STUMP REVISION- right Below Knee Amputation;  Surgeon: Newt Minion, MD;  Location: [place];  Service: Orthopedics;  Laterality: Right;  . Cardiac catheterization    . Amputation Left 08/29/2014    Procedure: Left Great Toe Amputation at MTP;  Surgeon: Newt Minion, MD;  Location: [place];  Service: Orthopedics;  Laterality: Left;  . Left heart catheterization with coronary angiogram [name] 06/28/2013    Procedure: LEFT HEART CATHETERIZATION WITH CORONARY ANGIOGRAM;  Surgeon: Burnell Blanks, MD;  Location[facility];  Service: Cardiovascular;  Laterality: [name];  . Amputation Left 10/31/2014    Procedure: Midfoot Amputation;  Surgeon: Newt Minion, MD;  Location: [place];  Service: Orthopedics;  Laterality: Left;   Social History:  reports that he has never smoked. He has never used smokeless tobacco. He reports that he does not drink alcohol or use illicit drugs.  Allergies  Allergen Reactions  . Amoxicillin Nausea And Vomiting    Family History  Problem Relation Age of Onset  . Diabetes    . Pancreatic cancer Mother   . Diabetes Mother   . Hyperlipidemia Mother   . Breast cancer Sister   . Depression Maternal Grandmother   . Heart disease Maternal Grandmother   . Depression Maternal Grandfather   . Heart disease Maternal Grandfather   . Depression Paternal Grandmother   . Heart disease Paternal Grandmother   . Depression Paternal Grandfather   . Heart disease Paternal Grandfather     Prior to  Admission medications   Medication Sig Start Date End Date Taking? Authorizing Provider  aspirin EC 81 MG tablet Take 1 tablet (81 mg total) by mouth daily. 01/09/14   Kathie Dike, MD  atorvastatin (LIPITOR) 80 MG tablet Take 1 tablet (80 mg total) by mouth daily. 09/29/14   Arnoldo Lenis, MD  carvedilol (COREG) 25 MG tablet Take 1.5 tablets (37.5 mg total) by mouth 2 (two) times daily. 10/20/14   Arnoldo Lenis, MD  ferrous sulfate 325 (65 FE) MG EC tablet Take 325 mg by mouth daily with breakfast.    Historical Provider, MD  furosemide (LASIX) 80 MG tablet Take 80 mg by mouth daily. 08/08/13   Lavon Paganini Angiulli, PA-C  hydrALAZINE (APRESOLINE) 10 MG tablet Take 1 tablet (10 mg total) by mouth 3 (three) times daily. 10/01/13   Burtis Junes, NP  HYDROcodone-acetaminophen (NORCO) 5-325 MG per tablet Take 1 tablet by mouth every 6 (six) hours as needed. 10/31/14   Newt Minion,  MD  insulin aspart (NOVOLOG) 100 UNIT/ML injection Inject 16 Units into the skin 3 (three) times daily before meals.     Historical Provider, MD  insulin detemir (LEVEMIR) 100 UNIT/ML injection Inject 36 Units into the skin at bedtime.  08/08/13   Daniel J Angiulli, PA-C  Loratadine-Pseudoephedrine (CLARITIN-D 12 HOUR PO) Take 1 tablet by mouth as needed (allergies).     Historical Provider, MD  losartan (COZAAR) 25 MG tablet Take 1 tablet (25 mg total) by mouth daily. 10/01/13   Burtis Junes, NP  nitroGLYCERIN (NITRODUR - DOSED IN MG/24 HR) 0.2 mg/hr patch Place 0.2 mg onto the skin daily. 12/23/14   Historical Provider, MD  pantoprazole (PROTONIX) 40 MG tablet Take 1 tablet (40 mg total) by mouth daily. 08/08/13   Lavon Paganini Angiulli, PA-C  polyethylene glycol (MIRALAX / GLYCOLAX) packet Take 17 g by mouth daily as needed for mild constipation.     Historical Provider, MD  potassium chloride (K-DUR,KLOR-CON) 10 MEQ tablet Take 1 tablet (10 mEq total) by mouth daily. 04/08/15   Arnoldo Lenis, MD  promethazine  (PHENERGAN) 25 MG tablet Take 1 tab every 6-8 hours for nausea. Patient taking differently: Take 25 mg by mouth every 6 (six) hours as needed for nausea. Take 1 tab every 6-8 hours for nausea. 10/18/13   Amy S Esterwood, PA-C  senna-docusate (SENOKOT-S) 8.6-50 MG per tablet Take 1 tablet by mouth daily as needed for mild constipation.  08/08/13   Cathlyn Parsons, PA-C   Physical Exam: Filed Vitals:   05/16/15 1506 05/16/15 1730  BP: 106/60 121/63  Pulse: 82 76  Temp: 97.7 F (36.5 C)   TempSrc: Oral   Resp: 16   Weight: 113.399 kg (250 lb)   SpO2: 97% 98%    Wt Readings from Last 3 Encounters:  05/16/15 113.399 kg (250 lb)  05/04/15 112.038 kg (247 lb)  12/25/14 112.038 kg (247 lb)    General:  Appears calm and comfortable, no distress Eyes: PERRL, normal lids, irises & conjunctiva ENT: grossly normal lips & tongue Neck: no LAD, masses or thyromegaly Cardiovascular: RRR, no m/r/g.  Telemetry: SR, no arrhythmias  Respiratory: CTA bilaterally, no w/r/r. Normal respiratory effort. Abdomen: soft, ntnd Skin: no rash or induration seen on limited exam Musculoskeletal: Right BKA, wearing prosthesis, left lower extremity with left midfoot amputation . erythema, mild tenderness and swelling overlying the shin at the tibia . Small 1 x 1 cm wound near the heel with minimal purulence   amputation site well-healed Psychiatric: grossly normal mood and affect, speech fluent and appropriate Neurologic: grossly non-focal.          Labs on Admission:  Basic Metabolic Panel:  Recent Labs Lab 05/16/15 1728  NA 140  K 3.9  CL 103  GLUCOSE 269*  BUN 27*  CREATININE 1.40*   Liver Function Tests: No results for input(s): AST, ALT, ALKPHOS, BILITOT, PROT, ALBUMIN in the last 168 hours. No results for input(s): LIPASE, AMYLASE in the last 168 hours. No results for input(s): AMMONIA in the last 168 hours. CBC:  Recent Labs Lab 05/16/15 1713 05/16/15 1728  WBC 9.8  --   NEUTROABS  7.2  --   HGB 12.0* 13.3  HCT 36.5* 39.0  MCV 81.3  --   PLT 207  --    Cardiac Enzymes: No results for input(s): CKTOTAL, CKMB, CKMBINDEX, TROPONINI in the last 168 hours.  BNP (last 3 results) No results for input(s): BNP in the last  8760 hours.  ProBNP (last 3 results) No results for input(s): PROBNP in the last 8760 hours.  CBG: No results for input(s): GLUCAP in the last 168 hours.  Radiological Exams on Admission: No results found.  EKG: pending  Assessment/Plan  Cellulitis left lower extremity -Pretibial, Not involving the stump site -Admit, check blood cultures, start IV vancomycin and add Zosyn for gram-negative coverage, since he is to diabetic -IV fluids ,Monitor clinically Check  lactic acid  DM -Reports that last hemoglobin A1c was 8, will repeat now -Continue Levemir at home dose, sliding scale insulin, continue NovoLog meal coverage at lower dose  Chronic combined systolic/diastolic heart failure - ICM, LVEF 40-45% by echo 12/2013, grade II diastolic dysfunction -Compensated, hold Lasix due to sepsis , and mild AK I   Mild AK I -Likely due to sepsis, hold Lasix -Hydrated, monitor,   CAD - cath  06/2013 with multivessel CAD, currently on med mgt - continue aspirin, Coreg, statin   PAD - followed by vascular and Ortho- Dr. Ninfa Linden and Dr.Duda - previous right BKA, previous multiple toe amputations. 08/30/14 treated for osteo and ulcer of left great toe with amputation. Jan 2016 had left midfoot amputation.   Hyperlipidemia - continue statin  HTN -  stable, hold hydralazine, continue Coreg  Code Status: Full code DVT Prophylaxis: Lovenox Family Communication: None at bedside Disposition Plan: Home when improved  Time spent: 98min  Brizza Nathanson [facility] Pager [phone]

## 2015-05-17 ENCOUNTER — Inpatient Hospital Stay (HOSPITAL_COMMUNITY): Payer: Commercial Managed Care - HMO

## 2015-05-17 DIAGNOSIS — I2510 Atherosclerotic heart disease of native coronary artery without angina pectoris: Secondary | ICD-10-CM

## 2015-05-17 DIAGNOSIS — E1165 Type 2 diabetes mellitus with hyperglycemia: Secondary | ICD-10-CM

## 2015-05-17 DIAGNOSIS — L02416 Cutaneous abscess of left lower limb: Secondary | ICD-10-CM | POA: Diagnosis not present

## 2015-05-17 DIAGNOSIS — L03116 Cellulitis of left lower limb: Secondary | ICD-10-CM | POA: Diagnosis not present

## 2015-05-17 DIAGNOSIS — R609 Edema, unspecified: Secondary | ICD-10-CM

## 2015-05-17 DIAGNOSIS — Z89521 Acquired absence of right knee: Secondary | ICD-10-CM | POA: Diagnosis not present

## 2015-05-17 LAB — COMPREHENSIVE METABOLIC PANEL
ALT: 27 U/L (ref 17–63)
AST: 17 U/L (ref 15–41)
Albumin: 3.1 g/dL — ABNORMAL LOW (ref 3.5–5.0)
Alkaline Phosphatase: 80 U/L (ref 38–126)
Anion gap: 8 (ref 5–15)
BUN: 22 mg/dL — ABNORMAL HIGH (ref 6–20)
CALCIUM: 8.4 mg/dL — AB (ref 8.9–10.3)
CO2: 21 mmol/L — ABNORMAL LOW (ref 22–32)
CREATININE: 1.18 mg/dL (ref 0.61–1.24)
Chloride: 108 mmol/L (ref 101–111)
GFR calc Af Amer: >60 mL/min (ref >60)
GFR calc non Af Amer: >60 mL/min (ref >60)
GLUCOSE: 238 mg/dL — AB (ref 65–99)
Potassium: 3.9 mmol/L (ref 3.5–5.1)
SODIUM: 137 mmol/L (ref 135–145)
Total Bilirubin: 0.4 mg/dL (ref 0.3–1.2)
Total Protein: 6.3 g/dL — ABNORMAL LOW (ref 6.5–8.1)

## 2015-05-17 LAB — CBC
HCT: 36.8 % — ABNORMAL LOW (ref 39.0–52.0)
Hemoglobin: 12 g/dL — ABNORMAL LOW (ref 13.0–17.0)
MCH: 26.7 pg (ref 26.0–34.0)
MCHC: 32.6 g/dL (ref 30.0–36.0)
MCV: 82 fL (ref 78.0–100.0)
Platelets: 221 10*3/uL (ref 150–400)
RBC: 4.49 MIL/uL (ref 4.22–5.81)
RDW: 13.9 % (ref 11.5–15.5)
WBC: 9.5 10*3/uL (ref 4.0–10.5)

## 2015-05-17 LAB — GLUCOSE, CAPILLARY
GLUCOSE-CAPILLARY: 223 mg/dL — AB (ref 65–99)
Glucose-Capillary: 287 mg/dL — ABNORMAL HIGH (ref 65–99)
Glucose-Capillary: 267 mg/dL — ABNORMAL HIGH (ref 65–99)
Glucose-Capillary: 268 mg/dL — ABNORMAL HIGH (ref 65–99)

## 2015-05-17 MED ORDER — ZOLPIDEM TARTRATE 5 MG PO TABS
5.0000 mg | ORAL_TABLET | Freq: Once | ORAL | Status: AC
  Administered 2015-05-17: 5 mg via ORAL
  Filled 2015-05-17: qty 1

## 2015-05-17 MED ORDER — COLLAGENASE 250 UNIT/GM EX OINT
TOPICAL_OINTMENT | Freq: Every day | CUTANEOUS | Status: DC
  Administered 2015-05-17 – 2015-05-19 (×3): via TOPICAL
  Filled 2015-05-17: qty 30

## 2015-05-17 MED ORDER — INSULIN DETEMIR 100 UNIT/ML ~~LOC~~ SOLN
45.0000 [IU] | Freq: Every day | SUBCUTANEOUS | Status: DC
  Administered 2015-05-17 – 2015-05-18 (×2): 45 [IU] via SUBCUTANEOUS
  Filled 2015-05-17 (×3): qty 0.45

## 2015-05-17 NOTE — Consult Note (Signed)
Reason for Consult: Ulceration cellulitis left lower extremity Referring Physician: Dr. Nigel Sloop Albert [name] is an 54 y.o. male.  HPI: Patient is a 54 year old gentleman with diabetic insensate neuropathy well known to me who is status post a transtibial amputation the right and a midfoot amputation the left. Patient reported some increasing pain in his left lower extremity he was started on some Keflex at the end of the week. Patient states he was still symptomatic and presented to the emergency room was admitted for cellulitis to the left lower extremity. Patient states that the redness is resolving but he still has pain all the way up to his groin along the course of the greater saphenous vein.  Past Medical History  Diagnosis Date  . Hypertension   . Hypercholesteremia   . CVA (cerebral infarction) 2011    R hand deficit  . Chronic sinusitis   . Allergic rhinitis   . Chronic cough   . Complication of anesthesia     couldn't swallow and talk  . Splenic infarct     setting of lupus anticoagulant  . Myocardial infarction   . Stroke     right arm weakness  . Pneumonia     hx of walking  . Diabetes mellitus without complication ~5697    last HbA1c ~9 .... fasting 160s  . GERD (gastroesophageal reflux disease)   . Anemia     takes supplement  . Osteomyelitis     first and third metatarsal    Past Surgical History  Procedure Laterality Date  . 4th toe amputation Left Jan. 2014    4th toe in [place]  . Tonsillectomy and adenoidectomy    . Penile prosthesis implant    . I&d extremity Left 12/08/2012    Procedure: IRRIGATION AND DEBRIDEMENT EXTREMITY;  Surgeon: Mcarthur Rossetti, MD;  Location: [place];  Service: Orthopedics;  Laterality: Left;  . Amputation Left 12/08/2012    Procedure: AMPUTATION DIGIT;  Surgeon: Mcarthur Rossetti, MD;  Location: [place];  Service: Orthopedics;  Laterality: Left;  3rd toe amputation possible 5th toe amputation  . I&d extremity Left  12/11/2012    Procedure: REPEAT I&D LEFT FOOT;  Surgeon: Mcarthur Rossetti, MD;  Location: [place];  Service: Orthopedics;  Laterality: Left;  . Amputation Left 12/11/2012    Procedure: Amputation of 2nd Toe;  Surgeon: Mcarthur Rossetti, MD;  Location: [place];  Service: Orthopedics;  Laterality: Left;  . Bypass graft popliteal to tibial Left 12/13/2012    Procedure: BYPASS GRAFT POPLITEAL TO TIBIAL;  Surgeon: Serafina Mitchell, MD;  Location: [facility];  Service: Vascular;  Laterality: Left;  Left Popliteal to Posterior Tibial Bypass Graft with reversed saphenous vein graft  . Incision and drainage Right 06/25/2013    Procedure: INCISION AND DRAINAGE RIGHT FOOT;  Surgeon: Mcarthur Rossetti, MD;  Location: [facility];  Service: Orthopedics;  Laterality: Right;  . Amputation Right 06/29/2013    Procedure: AMPUTATION BELOW KNEE;  Surgeon: Newt Minion, MD;  Location: [place];  Service: Orthopedics;  Laterality: Right;  . Tee without cardioversion [name] 07/03/2013    Procedure: TRANSESOPHAGEAL ECHOCARDIOGRAM (TEE);  Surgeon: Larey Dresser, MD;  Location: [place];  Service: Cardiovascular;  Laterality: [name];  . Stump revision Right 08/16/2013    Procedure: STUMP REVISION- right Below Knee Amputation;  Surgeon: Newt Minion, MD;  Location: [place];  Service: Orthopedics;  Laterality: Right;  . Cardiac catheterization    . Amputation Left 08/29/2014  Procedure: Left Great Toe Amputation at MTP;  Surgeon: Newt Minion, MD;  Location: [place];  Service: Orthopedics;  Laterality: Left;  . Left heart catheterization with coronary angiogram [name] 06/28/2013    Procedure: LEFT HEART CATHETERIZATION WITH CORONARY ANGIOGRAM;  Surgeon: Burnell Blanks, MD;  Location: [facility] CATH LAB;  Service: Cardiovascular;  Laterality: [name];  . Amputation Left 10/31/2014    Procedure: Midfoot Amputation;  Surgeon: Newt Minion, MD;  Location: [place];  Service: Orthopedics;  Laterality: Left;    Family History  Problem  Relation Age of Onset  . Diabetes    . Pancreatic cancer Mother   . Diabetes Mother   . Hyperlipidemia Mother   . Breast cancer Sister   . Depression Maternal Grandmother   . Heart disease Maternal Grandmother   . Depression Maternal Grandfather   . Heart disease Maternal Grandfather   . Depression Paternal Grandmother   . Heart disease Paternal Grandmother   . Depression Paternal Grandfather   . Heart disease Paternal Grandfather     Social History:  reports that he has never smoked. He has never used smokeless tobacco. He reports that he does not drink alcohol or use illicit drugs.  Allergies:  Allergies  Allergen Reactions  . Amoxicillin Nausea And Vomiting    Medications: I have reviewed the patient's current medications.  Results for orders placed or performed during the hospital encounter of 05/16/15 (from the past 48 hour(s))  CBC with Differential     Status: Abnormal   Collection Time: 05/16/15  5:13 PM  Result Value Ref Range   WBC 9.8 4.0 - 10.5 K/uL   RBC 4.49 4.22 - 5.81 MIL/uL   Hemoglobin 12.0 (L) 13.0 - 17.0 g/dL   HCT 36.5 (L) 39.0 - 52.0 %   MCV 81.3 78.0 - 100.0 fL   MCH 26.7 26.0 - 34.0 pg   MCHC 32.9 30.0 - 36.0 g/dL   RDW 13.7 11.5 - 15.5 %   Platelets 207 150 - 400 K/uL   Neutrophils Relative % 74 43 - 77 %   Neutro Abs 7.2 1.7 - 7.7 K/uL   Lymphocytes Relative 16 12 - 46 %   Lymphs Abs 1.6 0.7 - 4.0 K/uL   Monocytes Relative 8 3 - 12 %   Monocytes Absolute 0.8 0.1 - 1.0 K/uL   Eosinophils Relative 2 0 - 5 %   Eosinophils Absolute 0.2 0.0 - 0.7 K/uL   Basophils Relative 0 0 - 1 %   Basophils Absolute 0.0 0.0 - 0.1 K/uL  I-stat Chem 8, ED     Status: Abnormal   Collection Time: 05/16/15  5:28 PM  Result Value Ref Range   Sodium 140 135 - 145 mmol/L   Potassium 3.9 3.5 - 5.1 mmol/L   Chloride 103 101 - 111 mmol/L   BUN 27 (H) 6 - 20 mg/dL   Creatinine, Ser 1.40 (H) 0.61 - 1.24 mg/dL   Glucose, Bld 269 (H) 65 - 99 mg/dL   Calcium, Ion 1.15  1.12 - 1.23 mmol/L   TCO2 20 0 - 100 mmol/L   Hemoglobin 13.3 13.0 - 17.0 g/dL   HCT 39.0 39.0 - 52.0 %  I-Stat CG4 Lactic Acid, ED     Status: None   Collection Time: 05/16/15  5:28 PM  Result Value Ref Range   Lactic Acid, Venous 1.29 0.5 - 2.0 mmol/L  Glucose, capillary     Status: Abnormal   Collection Time: 05/16/15  7:39  PM  Result Value Ref Range   Glucose-Capillary 211 (H) 65 - 99 mg/dL   Comment 1 Notify RN    Comment 2 Document in Chart   CBC     Status: Abnormal   Collection Time: 05/17/15  6:10 AM  Result Value Ref Range   WBC 9.5 4.0 - 10.5 K/uL   RBC 4.49 4.22 - 5.81 MIL/uL   Hemoglobin 12.0 (L) 13.0 - 17.0 g/dL   HCT 36.8 (L) 39.0 - 52.0 %   MCV 82.0 78.0 - 100.0 fL   MCH 26.7 26.0 - 34.0 pg   MCHC 32.6 30.0 - 36.0 g/dL   RDW 13.9 11.5 - 15.5 %   Platelets 221 150 - 400 K/uL  Comprehensive metabolic panel     Status: Abnormal   Collection Time: 05/17/15  6:10 AM  Result Value Ref Range   Sodium 137 135 - 145 mmol/L   Potassium 3.9 3.5 - 5.1 mmol/L   Chloride 108 101 - 111 mmol/L   CO2 21 (L) 22 - 32 mmol/L   Glucose, Bld 238 (H) 65 - 99 mg/dL   BUN 22 (H) 6 - 20 mg/dL   Creatinine, Ser 1.18 0.61 - 1.24 mg/dL   Calcium 8.4 (L) 8.9 - 10.3 mg/dL   Total Protein 6.3 (L) 6.5 - 8.1 g/dL   Albumin 3.1 (L) 3.5 - 5.0 g/dL   AST 17 15 - 41 U/L   ALT 27 17 - 63 U/L   Alkaline Phosphatase 80 38 - 126 U/L   Total Bilirubin 0.4 0.3 - 1.2 mg/dL   GFR calc non Af Amer >60 >60 mL/min   GFR calc Af Amer >60 >60 mL/min    Comment: (NOTE) The eGFR has been calculated using the CKD EPI equation. This calculation has not been validated in all clinical situations. eGFR's persistently <60 mL/min signify possible Chronic Kidney Disease.    Anion gap 8 5 - 15  Glucose, capillary     Status: Abnormal   Collection Time: 05/17/15  9:32 AM  Result Value Ref Range   Glucose-Capillary 287 (H) 65 - 99 mg/dL    No results found.  Review of Systems  All other systems reviewed  and are negative.  Blood pressure 128/64, pulse 75, temperature 98.5 F (36.9 C), temperature source Oral, resp. rate 18, height 5' 11"  (1.803 m), weight 111.8 kg (246 lb 7.6 oz), SpO2 98 %. Physical Exam On examination patient has a callus over the end of his transmetatarsal amputation but there is no cellulitis or tenderness to palpation. He has a very superficial Wagner grade 1 ulcer over the lateral border of the transmetatarsal amputation this has good granulation tissue no signs of infection no fluctuance. Examination the heel he does have a very small ischemic ulcer over the heel with fibrinous tissue at the base of the wound this is about 10 mm in diameter. There is no cellulitis around this area. Patient does have redness in the left leg. No significant venous stasis changes. He has a surgical incisions from where his greater saphenous vein was harvested for cardiac surgery. Patient is tender to palpation along the course the greater saphenous vein all the way up to his groin. Assessment/Plan: Assessment: Diabetic insensate neuropathy with decubitus left heel ulcer wider grade 1 ulcer lateral border of the transmetatarsal amputation with redness for the left lower extremity and pain along the course of the greater saphenous vein.  Plan: I agree with the current antibiotics regimen. I will  write orders for wound care and wound protection to the left foot. Patient may benefit from an ultrasound to rule out a DVT. I will follow-up in the office.  Coyle Stordahl V 05/17/2015, 10:01 AM

## 2015-05-17 NOTE — Progress Notes (Signed)
Utilization Review Completed.Albert [name] T7/31/2016  

## 2015-05-17 NOTE — Progress Notes (Signed)
Patient Demographics:    Albert [name], is a 54 y.o. male, DOB - Apr 18, 1961, KMQ:[id number]  Admit date - 05/16/2015   Admitting Physician Domenic Polite, MD  Outpatient Primary MD for the patient is Delphina Cahill, MD  LOS - 1   Chief Complaint  Patient presents with  . Leg Pain        Subjective:    Albert [name] today has, No headache, No chest pain, No abdominal pain - No Nausea, No new weakness tingling or numbness, No Cough - SOB. Mild pain and discomfort in the Left leg   Assessment  & Plan :     1. Sepsis due to left leg cellulitis in a diabetic patient. Also has open sore on the left foot kindly see Ortho-Note for details. Improved on empiric anti-biotics which are vancomycin and Zosyn, both will be continued, continue supportive care, appreciate input by orthopedics. Will rule out DVT with ultrasound.   2. CAD with chronic combined systolic and diastolic heart failure last EF of 40-45% in March 2015. Compensated, since the abscess has improved. IV fluids, resume Lasix 1-2 days. Continue aspirin, statin, Coreg for secondary prevention.   3.ARF due to #1 above. Resolved with hydration.   4.PAD. Currently no acute issues continue aspirin and statin for secondary prevention.    5. Dyslipidemia. On home dose statin    6.Essential hypertension. Stable on Coreg,   7. DM2 - will increase Levemir dose for better control continue sliding scale.  Lab Results  Component Value Date   HGBA1C 8.2* 06/25/2013    CBG (last 3)   Recent Labs  05/16/15 1939 05/17/15 0932  GLUCAP 211* 287*         Code Status : Full  Family Communication  : None  Disposition Plan  : TBD  Consults  :  Ortho Dr Sharol Given  Procedures  :   L Leg US  DVT Prophylaxis  :  Lovenox   Lab Results    Component Value Date   PLT 221 05/17/2015    Inpatient Medications  Scheduled Meds: . aspirin EC  81 mg Oral Daily  . atorvastatin  80 mg Oral q1800  . carvedilol  37.5 mg Oral BID  . collagenase   Topical Daily  . enoxaparin (LOVENOX) injection  40 mg Subcutaneous Q24H  . insulin aspart  5 Units Subcutaneous TID WC  . insulin detemir  36 Units Subcutaneous QHS  . pantoprazole  40 mg Oral Daily  . piperacillin-tazobactam (ZOSYN)  IV  3.375 g Intravenous Q8H  . vancomycin  750 mg Intravenous Q12H   Continuous Infusions: . sodium chloride 100 mL/hr at 05/16/15 2052   PRN Meds:.acetaminophen **OR** acetaminophen, HYDROcodone-acetaminophen, ondansetron **OR** ondansetron (ZOFRAN) IV, polyethylene glycol, promethazine, senna-docusate  Antibiotics  :    Anti-infectives    Start     Dose/Rate Route Frequency Ordered Stop   05/17/15 0800  vancomycin (VANCOCIN) IVPB 750 mg/150 ml premix     750 mg 150 mL/hr over 60 Minutes Intravenous Every 12 hours 05/16/15 1831     05/17/15 0100  piperacillin-tazobactam (ZOSYN) IVPB 3.375 g     3.375 g 12.5 mL/hr over 240 Minutes Intravenous Every 8 hours 05/16/15 1831     05/16/15 1845  piperacillin-tazobactam (ZOSYN) IVPB 3.375 g     3.375 g 100 mL/hr over 30 Minutes Intravenous  Once 05/16/15 1830 05/16/15 2115   05/16/15 1845  vancomycin (VANCOCIN) 2,000 mg in sodium chloride 0.9 % 500 mL IVPB     2,000 mg 250 mL/hr over 120 Minutes Intravenous  Once 05/16/15 1830 05/17/15 0017   05/16/15 1715  clindamycin (CLEOCIN) IVPB 900 mg     900 mg 100 mL/hr over 30 Minutes Intravenous  Once 05/16/15 1706 05/16/15 1829        Objective:   Filed Vitals:   05/16/15 1745 05/16/15 1815 05/16/15 1940 05/17/15 0450  BP: 108/68 129/60 139/74 128/64  Pulse: 72 81 87 75  Temp:   99 F (37.2 C) 98.5 F (36.9 C)  TempSrc:   Oral Oral  Resp:   18 18  Height:   5\' 11"  (1.803 m)   Weight:   111.8 kg (246 lb 7.6 oz)   SpO2: 98% 99% 99% 98%    Wt  Readings from Last 3 Encounters:  05/16/15 111.8 kg (246 lb 7.6 oz)  05/04/15 112.038 kg (247 lb)  12/25/14 112.038 kg (247 lb)     Intake/Output Summary (Last 24 hours) at 05/17/15 1009 Last data filed at 05/17/15 0523  Gross per 24 hour  Intake 2191.67 ml  Output   1100 ml  Net 1091.67 ml     Physical Exam  Awake Alert, Oriented X 3, No new F.N deficits, Normal affect [place].AT,PERRAL Supple Neck,No JVD, No cervical lymphadenopathy appriciated.  Symmetrical Chest wall movement, Good air movement bilaterally, CTAB RRR,No Gallops,Rubs or new Murmurs, No Parasternal Heave +ve B.Sounds, Abd Soft, No tenderness, No organomegaly appriciated, No rebound - guarding or rigidity. No Cyanosis, Clubbing or edema, No new Rash or bruise BKA stump appears stable, left old transmetatarsal amputation, left heel ulcer under bandage, left leg cellulitis with some warmth and redness below the knee, marked out and seems to be improving    Data Review:   Micro Results No results found for this or any previous visit (from the past 240 hour(s)).  Radiology Reports No results found.   CBC  Recent Labs Lab 05/16/15 1713 05/16/15 1728 05/17/15 0610  WBC 9.8  --  9.5  HGB 12.0* 13.3 12.0*  HCT 36.5* 39.0 36.8*  PLT 207  --  221  MCV 81.3  --  82.0  MCH 26.7  --  26.7  MCHC 32.9  --  32.6  RDW 13.7  --  13.9  LYMPHSABS 1.6  --   --   MONOABS 0.8  --   --   EOSABS 0.2  --   --   BASOSABS 0.0  --   --     Chemistries   Recent Labs Lab 05/16/15 1728 05/17/15 0610  NA 140 137  K 3.9 3.9  CL 103 108  CO2  --  21*  GLUCOSE 269* 238*  BUN 27* 22*  CREATININE 1.40* 1.18  CALCIUM  --  8.4*  AST  --  17  ALT  --  27  ALKPHOS  --  80  BILITOT  --  0.4   ------------------------------------------------------------------------------------------------------------------ estimated creatinine clearance is 93.1 mL/min (by C-G formula based on Cr of  1.18). ------------------------------------------------------------------------------------------------------------------ No results for input(s): HGBA1C in the last 72 hours. ------------------------------------------------------------------------------------------------------------------ No results for input(s): CHOL, HDL, LDLCALC, TRIG, CHOLHDL, LDLDIRECT in the last 72 hours. ------------------------------------------------------------------------------------------------------------------ No results for input(s): TSH, T4TOTAL, T3FREE, THYROIDAB in the last 72 hours.  Invalid input(s): FREET3 ------------------------------------------------------------------------------------------------------------------ No results for input(s): VITAMINB12, FOLATE, FERRITIN, TIBC, IRON, RETICCTPCT in the last 72 hours.  Coagulation profile No results for input(s): INR, PROTIME in the last 168 hours.  No results for input(s): DDIMER in the last 72 hours.  Cardiac Enzymes No results for input(s): CKMB, TROPONINI, MYOGLOBIN in the last 168 hours.  Invalid input(s): CK ------------------------------------------------------------------------------------------------------------------ Invalid input(s): POCBNP   Time Spent in minutes  35   Bashar Milam K M.D on 05/17/2015 at 10:09 AM  Between 7am to 7pm - Pager - [phone]  After 7pm go to [email or web address] - password [facility]  [facility] -  Office  [phone]

## 2015-05-17 NOTE — Progress Notes (Signed)
VASCULAR LAB PRELIMINARY  PRELIMINARY  PRELIMINARY  PRELIMINARY  Left lower extremity venous duplex completed.    Preliminary report:  There is no DVT or SVT noted in the left lower extremity. Incidentally, the arterial bypass graft appears patent.   Deroy Noah, RVT 05/17/2015, 4:33 PM

## 2015-05-18 LAB — HEMOGLOBIN A1C
Hgb A1c MFr Bld: 8.2 % — ABNORMAL HIGH (ref 4.8–5.6)
Mean Plasma Glucose: 189 mg/dL

## 2015-05-18 LAB — GLUCOSE, CAPILLARY
GLUCOSE-CAPILLARY: 206 mg/dL — AB (ref 65–99)
Glucose-Capillary: 163 mg/dL — ABNORMAL HIGH (ref 65–99)
Glucose-Capillary: 248 mg/dL — ABNORMAL HIGH (ref 65–99)

## 2015-05-18 NOTE — Progress Notes (Signed)
Orthopedic Tech Progress Note Patient Details:  Albert [name] [birth year] [id number] Left LE [employer] Ortho Devices Type of Ortho Device: Postop shoe/boot, Other (comment) Ortho Device/Splint Location: [facility] Ortho Device/Splint Interventions: Application   Albert [name] 05/18/2015, 6:18 AM

## 2015-05-18 NOTE — Progress Notes (Addendum)
Inpatient Diabetes Program Recommendations  AACE/ADA: New Consensus Statement on Inpatient Glycemic Control (2013)  Target Ranges:  Prepandial:   less than 140 mg/dL      Peak postprandial:   less than 180 mg/dL (1-2 hours)      Critically ill patients:  140 - 180 mg/dL   In addition to basal levemir and meal coverage, please consider using the correction scales per below: Inpatient Diabetes Program Recommendations Correction (SSI): Please consider addition of correction coverage of moderate tidwc and HS scales  Thank you Rosita Kea, RN, MSN, CDE  Diabetes Inpatient Program Office: [phone] Pager: [phone] 8:00 am to 5:00 pm

## 2015-05-18 NOTE — Progress Notes (Signed)
Patient Demographics:    Albert [name], is a 54 y.o. male, DOB - August 19, 1961, OTR:[id number]  Admit date - 05/16/2015   Admitting Physician Domenic Polite, MD  Outpatient Primary MD for the patient is Delphina Cahill, MD  LOS - 2   Chief Complaint  Patient presents with  . Leg Pain        Subjective:    Albert [name] today has, No headache, No chest pain, No abdominal pain - No Nausea, No new weakness tingling or numbness, No Cough - SOB. Mild pain and discomfort in the Left leg, feels better   Assessment  & Plan :     1. Sepsis due to left leg cellulitis in a diabetic patient. Also has open sore on the left foot kindly see Ortho-Note for details. Improved on empiric anti-biotics which are vancomycin and Zosyn, both will be continued, continue supportive care, appreciate input by orthopedics. Overall much improved venous duplex unremarkable.   2. CAD with chronic combined systolic and diastolic heart failure last EF of 40-45% in March 2015. Compensated, since the abscess has improved. IV fluids, resume Lasix 1-2 days. Continue aspirin, statin, Coreg for secondary prevention.   3.ARF due to #1 above. Resolved with hydration.   4.PAD. Currently no acute issues continue aspirin and statin for secondary prevention.    5. Dyslipidemia. On home dose statin    6.Essential hypertension. Stable on Coreg,   7. DM2 - will increase Levemir dose for better control continue sliding scale.  Lab Results  Component Value Date   HGBA1C 8.2* 06/25/2013    CBG (last 3)   Recent Labs  05/17/15 1737 05/17/15 2145 05/18/15 0752  GLUCAP 223* 268* 163*     Code Status : Full  Family Communication  : None  Disposition Plan  : TBD  Consults  :  Ortho Dr Sharol Given  Procedures  :   L Leg [place] No  DVT-SVT  DVT Prophylaxis  :  Lovenox   Lab Results  Component Value Date   PLT 221 05/17/2015    Inpatient Medications  Scheduled Meds: . aspirin EC  81 mg Oral Daily  . atorvastatin  80 mg Oral q1800  . carvedilol  37.5 mg Oral BID  . collagenase   Topical Daily  . enoxaparin (LOVENOX) injection  40 mg Subcutaneous Q24H  . insulin aspart  5 Units Subcutaneous TID WC  . insulin detemir  45 Units Subcutaneous QHS  . pantoprazole  40 mg Oral Daily  . piperacillin-tazobactam (ZOSYN)  IV  3.375 g Intravenous Q8H  . vancomycin  750 mg Intravenous Q12H   Continuous Infusions: . sodium chloride 100 mL/hr at 05/18/15 0740   PRN Meds:.acetaminophen **OR** acetaminophen, HYDROcodone-acetaminophen, ondansetron **OR** ondansetron (ZOFRAN) IV, polyethylene glycol, promethazine, senna-docusate  Antibiotics  :    Anti-infectives    Start     Dose/Rate Route Frequency Ordered Stop   05/17/15 0800  vancomycin (VANCOCIN) IVPB 750 mg/150 ml premix     750 mg 150 mL/hr over 60 Minutes Intravenous Every 12 hours 05/16/15 1831     05/17/15 0100  piperacillin-tazobactam (ZOSYN) IVPB 3.375 g     3.375 g 12.5 mL/hr over 240 Minutes Intravenous Every 8 hours 05/16/15 1831     05/16/15  1845  piperacillin-tazobactam (ZOSYN) IVPB 3.375 g     3.375 g 100 mL/hr over 30 Minutes Intravenous  Once 05/16/15 1830 05/16/15 2115   05/16/15 1845  vancomycin (VANCOCIN) 2,000 mg in sodium chloride 0.9 % 500 mL IVPB     2,000 mg 250 mL/hr over 120 Minutes Intravenous  Once 05/16/15 1830 05/17/15 0017   05/16/15 1715  clindamycin (CLEOCIN) IVPB 900 mg     900 mg 100 mL/hr over 30 Minutes Intravenous  Once 05/16/15 1706 05/16/15 1829        Objective:   Filed Vitals:   05/17/15 0450 05/17/15 1454 05/17/15 2112 05/18/15 0526  BP: 128/64 113/48 133/72 130/73  Pulse: 75 69 73 79  Temp: 98.5 F (36.9 C) 98.3 F (36.8 C) 98.6 F (37 C) 98 F (36.7 C)  TempSrc: Oral Oral Oral Oral  Resp: 18 18 18 18    Height:      Weight:      SpO2: 98% 98% 98% 97%    Wt Readings from Last 3 Encounters:  05/16/15 111.8 kg (246 lb 7.6 oz)  05/04/15 112.038 kg (247 lb)  12/25/14 112.038 kg (247 lb)     Intake/Output Summary (Last 24 hours) at 05/18/15 1105 Last data filed at 05/18/15 0817  Gross per 24 hour  Intake 3604.99 ml  Output   2100 ml  Net 1504.99 ml     Physical Exam  Awake Alert, Oriented X 3, No new F.N deficits, Normal affect [place].AT,PERRAL Supple Neck,No JVD, No cervical lymphadenopathy appriciated.  Symmetrical Chest wall movement, Good air movement bilaterally, CTAB RRR,No Gallops,Rubs or new Murmurs, No Parasternal Heave +ve B.Sounds, Abd Soft, No tenderness, No organomegaly appriciated, No rebound - guarding or rigidity. No Cyanosis, Clubbing or edema, No new Rash or bruise BKA stump appears stable, left old transmetatarsal amputation, left heel ulcer under bandage, left leg cellulitis with some warmth and redness below the knee, marked out and seems to be improving    Data Review:   Micro Results Recent Results (from the past 240 hour(s))  Culture, blood (routine x 2)     Status: None (Preliminary result)   Collection Time: 05/16/15  9:17 PM  Result Value Ref Range Status   Specimen Description BLOOD RIGHT HAND  Final   Special Requests IN PEDIATRIC BOTTLE 2CC  Final   Culture NO GROWTH < 24 HOURS  Final   Report Status PENDING  Incomplete  Culture, blood (routine x 2)     Status: None (Preliminary result)   Collection Time: 05/16/15  9:20 PM  Result Value Ref Range Status   Specimen Description BLOOD LEFT ARM  Final   Special Requests   Final    BOTTLES DRAWN AEROBIC AND ANAEROBIC 5CC BLUE AND 3CC RED   Culture NO GROWTH < 24 HOURS  Final   Report Status PENDING  Incomplete    Radiology Reports No results found.   CBC  Recent Labs Lab 05/16/15 1713 05/16/15 1728 05/17/15 0610  WBC 9.8  --  9.5  HGB 12.0* 13.3 12.0*  HCT 36.5* 39.0 36.8*  PLT 207   --  221  MCV 81.3  --  82.0  MCH 26.7  --  26.7  MCHC 32.9  --  32.6  RDW 13.7  --  13.9  LYMPHSABS 1.6  --   --   MONOABS 0.8  --   --   EOSABS 0.2  --   --   BASOSABS 0.0  --   --  Chemistries   Recent Labs Lab 05/16/15 1728 05/17/15 0610  NA 140 137  K 3.9 3.9  CL 103 108  CO2  --  21*  GLUCOSE 269* 238*  BUN 27* 22*  CREATININE 1.40* 1.18  CALCIUM  --  8.4*  AST  --  17  ALT  --  27  ALKPHOS  --  80  BILITOT  --  0.4   ------------------------------------------------------------------------------------------------------------------ estimated creatinine clearance is 93.1 mL/min (by C-G formula based on Cr of 1.18). ------------------------------------------------------------------------------------------------------------------ No results for input(s): HGBA1C in the last 72 hours. ------------------------------------------------------------------------------------------------------------------ No results for input(s): CHOL, HDL, LDLCALC, TRIG, CHOLHDL, LDLDIRECT in the last 72 hours. ------------------------------------------------------------------------------------------------------------------ No results for input(s): TSH, T4TOTAL, T3FREE, THYROIDAB in the last 72 hours.  Invalid input(s): FREET3 ------------------------------------------------------------------------------------------------------------------ No results for input(s): VITAMINB12, FOLATE, FERRITIN, TIBC, IRON, RETICCTPCT in the last 72 hours.  Coagulation profile No results for input(s): INR, PROTIME in the last 168 hours.  No results for input(s): DDIMER in the last 72 hours.  Cardiac Enzymes No results for input(s): CKMB, TROPONINI, MYOGLOBIN in the last 168 hours.  Invalid input(s): CK ------------------------------------------------------------------------------------------------------------------ Invalid input(s): POCBNP   Time Spent in minutes  35   SINGH,PRASHANT K M.D on  05/18/2015 at 11:05 AM  Between 7am to 7pm - Pager - [phone]  After 7pm go to [email or web address] - password [facility]  [facility] -  Office  [phone]

## 2015-05-18 NOTE — Consult Note (Signed)
Review of patient's chart, left heel wound has been addressed per ortho.  Patient is known to ortho service.  Wound care orders written per Dr. Sharol Given. WOC will not consult for this reason Para March RN,CWOCN [phone]

## 2015-05-18 NOTE — Care Management Note (Signed)
Case Management Note  Patient Details  Name: Albert [name] MRN: [id number] Date of Birth: 1961-06-03  Subjective/Objective:                 Patient from home alone, has R BKA and L foot in boot with infection. Patient has help from ex wife at home. Patient has prosthesis, cane, and wheelchair. Denies any needs at this time. Patient has received HH from [facility] in past and would prefer them if needed at discharge.    Action/Plan:  Will continue to follow.  Expected Discharge Date:                  Expected Discharge Plan:  Home/Self Care  In-House Referral:     Discharge planning Services  CM Consult  Post Acute Care Choice:    Choice offered to:  Patient  DME Arranged:    DME Agency:     HH Arranged:    [place] Agency:  [place]  Status of Service:  In process, will continue to follow  Medicare Important Message Given:    Date Medicare IM Given:    Medicare IM give by:    Date Additional Medicare IM Given:    Additional Medicare Important Message give by:     If discussed at [place] of Stay Meetings, dates discussed:    Additional Comments:  Carles Collet, RN 05/18/2015, 10:59 AM

## 2015-05-19 LAB — GLUCOSE, CAPILLARY: Glucose-Capillary: 260 mg/dL — ABNORMAL HIGH (ref 65–99)

## 2015-05-19 MED ORDER — DOXYCYCLINE HYCLATE 100 MG PO CAPS: 100.0000 mg | ORAL_CAPSULE | Freq: Two times a day (BID) | ORAL | Status: DC

## 2015-05-19 NOTE — Discharge Instructions (Signed)
Wear the PRAFO 24 hours a day left foot and ankle Santyl dressing change with dry dressing to left heel ulcer change daily.  Follow with Primary MD Delphina Cahill, MD in 7 days   Get CBC, CMP, 2 view Chest X ray checked  by Primary MD next visit.    Activity: As tolerated with Full fall precautions use walker/cane & assistance as needed   Disposition Home     Diet: Heart Healthy Low Carb  Accuchecks 4 times/day, Once in AM empty stomach and then before each meal. Log in all results and show them to your Prim.MD in 3 days. If any glucose reading is under 80 or above 300 call your Prim MD immidiately. Follow Low glucose instructions for glucose under 80 as instructed.    For Heart failure patients - Check your Weight same time everyday, if you gain over 2 pounds, or you develop in leg swelling, experience more shortness of breath or chest pain, call your Primary MD immediately. Follow Cardiac Low Salt Diet and 1.5 lit/day fluid restriction.   On your next visit with your primary care physician please Get Medicines reviewed and adjusted.   Please request your Prim.MD to go over all Hospital Tests and Procedure/Radiological results at the follow up, please get all Hospital records sent to your Prim MD by signing hospital release before you go home.   If you experience worsening of your admission symptoms, develop shortness of breath, life threatening emergency, suicidal or homicidal thoughts you must seek medical attention immediately by calling 911 or calling your MD immediately  if symptoms less severe.  You Must read complete instructions/literature along with all the possible adverse reactions/side effects for all the Medicines you take and that have been prescribed to you. Take any new Medicines after you have completely understood and accpet all the possible adverse reactions/side effects.   Do not drive, operating heavy machinery, perform activities at heights, swimming or  participation in water activities or provide baby sitting services if your were admitted for syncope or siezures until you have seen by Primary MD or a Neurologist and advised to do so again.  Do not drive when taking Pain medications.    Do not take more than prescribed Pain, Sleep and Anxiety Medications  Special Instructions: If you have smoked or chewed Tobacco  in the last 2 yrs please stop smoking, stop any regular Alcohol  and or any Recreational drug use.  Wear Seat belts while driving.   Please note  You were cared for by a hospitalist during your hospital stay. If you have any questions about your discharge medications or the care you received while you were in the hospital after you are discharged, you can call the unit and asked to speak with the hospitalist on call if the hospitalist that took care of you is not available. Once you are discharged, your primary care physician will handle any further medical issues. Please note that NO REFILLS for any discharge medications will be authorized once you are discharged, as it is imperative that you return to your primary care physician (or establish a relationship with a primary care physician if you do not have one) for your aftercare needs so that they can reassess your need for medications and monitor your lab values.

## 2015-05-19 NOTE — Discharge Summary (Signed)
Albert [name], is a 54 y.o. male  DOB [birth year]  MRN [id number].  Admission date:  05/16/2015  Admitting Physician  Domenic Polite, MD  Discharge Date:  05/19/2015   Primary MD  Delphina Cahill, MD  Recommendations for primary care physician for things to follow:   Monitor left leg cellulitis closely. Check CBC, CMP next visit.   Admission Diagnosis  Cellulitis of left leg [L03.116]   Discharge Diagnosis  Cellulitis of left leg [L03.116]     Active Problems:   Diabetes mellitus out of control   Atherosclerotic peripheral vascular disease   Coronary atherosclerosis of native coronary artery   Hx of BKA   Cellulitis of left leg   Cellulitis      Past Medical History  Diagnosis Date  . Hypertension   . Hypercholesteremia   . CVA (cerebral infarction) 2011    R hand deficit  . Chronic sinusitis   . Allergic rhinitis   . Chronic cough   . Complication of anesthesia     couldn't swallow and talk  . Splenic infarct     setting of lupus anticoagulant  . Myocardial infarction   . Stroke     right arm weakness  . Pneumonia     hx of walking  . Diabetes mellitus without complication ~1856    last HbA1c ~9 .... fasting 160s  . GERD (gastroesophageal reflux disease)   . Anemia     takes supplement  . Osteomyelitis     first and third metatarsal    Past Surgical History  Procedure Laterality Date  . 4th toe amputation Left Jan. 2014    4th toe in [place]  . Tonsillectomy and adenoidectomy    . Penile prosthesis implant    . I&d extremity Left 12/08/2012    Procedure: IRRIGATION AND DEBRIDEMENT EXTREMITY;  Surgeon: Mcarthur Rossetti, MD;  Location: [place];  Service: Orthopedics;  Laterality: Left;  . Amputation Left 12/08/2012    Procedure: AMPUTATION DIGIT;  Surgeon: Mcarthur Rossetti, MD;   Location: [place];  Service: Orthopedics;  Laterality: Left;  3rd toe amputation possible 5th toe amputation  . I&d extremity Left 12/11/2012    Procedure: REPEAT I&D LEFT FOOT;  Surgeon: Mcarthur Rossetti, MD;  Location: [place];  Service: Orthopedics;  Laterality: Left;  . Amputation Left 12/11/2012    Procedure: Amputation of 2nd Toe;  Surgeon: Mcarthur Rossetti, MD;  Location: [place];  Service: Orthopedics;  Laterality: Left;  . Bypass graft popliteal to tibial Left 12/13/2012    Procedure: BYPASS GRAFT POPLITEAL TO TIBIAL;  Surgeon: Serafina Mitchell, MD;  Location: [facility];  Service: Vascular;  Laterality: Left;  Left Popliteal to Posterior Tibial Bypass Graft with reversed saphenous vein graft  . Incision and drainage Right 06/25/2013    Procedure: INCISION AND DRAINAGE RIGHT FOOT;  Surgeon: Mcarthur Rossetti, MD;  Location: [facility];  Service: Orthopedics;  Laterality: Right;  . Amputation Right 06/29/2013    Procedure: AMPUTATION BELOW KNEE;  Surgeon:  Newt Minion, MD;  Location: [place];  Service: Orthopedics;  Laterality: Right;  . Tee without cardioversion [name] 07/03/2013    Procedure: TRANSESOPHAGEAL ECHOCARDIOGRAM (TEE);  Surgeon: Larey Dresser, MD;  Location: [place];  Service: Cardiovascular;  Laterality: [name];  . Stump revision Right 08/16/2013    Procedure: STUMP REVISION- right Below Knee Amputation;  Surgeon: Newt Minion, MD;  Location: [facility];  Service: Orthopedics;  Laterality: Right;  . Cardiac catheterization    . Amputation Left 08/29/2014    Procedure: Left Great Toe Amputation at MTP;  Surgeon: Newt Minion, MD;  Location: [place];  Service: Orthopedics;  Laterality: Left;  . Left heart catheterization with coronary angiogram [name] 06/28/2013    Procedure: LEFT HEART CATHETERIZATION WITH CORONARY ANGIOGRAM;  Surgeon: Burnell Blanks, MD;  Location: [facility] C[facility];  Service: Cardiovascular;  Laterality: [name];  . Amputation Left 10/31/2014    Procedure: Midfoot  Amputation;  Surgeon: Newt Minion, MD;  Location: [place];  Service: Orthopedics;  Laterality: Left;       HPI  from the history and physical done on the day of admission:    Albert [name] is a 54 y.o. male with PMH of DM, PAD, s/p R BKA and L mid foot amputation, multivessel CAD, ischemic cardiomyopathy EF 40-45%, HTN, Dyslipidemia Presents to the ER with the above complaints. Patient reports being in his usual state of health until Thursday. Through the day on Thursday he felt poorly was experiencing chills felt tired and sluggish and also slightly feverish he was concerned about small healed wound and call Dr. Sharol Given who called in some Keflex which she started Thursday night, later Friday evening he noticed redness pain and swelling involving his left lower extremity.  Hence got concerned last night and presented to the ER today. In the ER , he is afebrile, labs unremarkable except for mild AK I      Hospital Course:     1. Sepsis due to left leg cellulitis in a diabetic patient. Also has open sore on the left foot kindly see Ortho-Note for details. Improved on empiric anti-biotics which were vancomycin and Zosyn, clinically cellulitis completely resolved, ultrasound of the left lower extremity does not show any evidence of clot. Will be placed on 10 days of oral doxycycline along with outpatient follow-up with Dr. due to his primary orthopedic surgeon, he was seen by him in the hospital.   2. CAD with chronic combined systolic and diastolic heart failure last EF of 40-45% in March 2015. resume Lasix upon discharge. Continue aspirin, statin, Coreg for secondary prevention.   3.ARF due to #1 above. Resolved with hydration.   4.PAD. Currently no acute issues continue aspirin and statin for secondary prevention.    5. Dyslipidemia. On home dose statin    6.Essential hypertension. Stable on Coreg.   7. DM2 - continue home regimen and follow with PCP for better glycemic  control.  Lab Results  Component Value Date   HGBA1C 8.2* 05/16/2015    CBG (last 3)   Recent Labs  05/18/15 1120 05/18/15 1625 05/18/15 2133  GLUCAP 206* 248* 260*     Discharge Condition: Stable  Follow UP  Follow-up Information    Follow up with DUDA,MARCUS V, MD In 2 weeks.   Specialty:  Orthopedic Surgery   Contact information:   [place] [address] [phone]       Follow up with Delphina Cahill, MD. Schedule an appointment  as soon as possible for a visit in 1 week.   Specialty:  Internal Medicine   Contact information:    [place] [address] [phone]        Consults obtained - Ortho  Diet and Activity recommendation: See Discharge Instructions below  Discharge Instructions       Discharge Instructions    Discharge instructions    Complete by:  As directed   Wear the PRAFO 24 hours a day left foot and ankle Santyl dressing change with dry dressing to left heel ulcer change daily.  Follow with Primary MD Delphina Cahill, MD in 7 days   Get CBC, CMP, 2 view Chest X ray checked  by Primary MD next visit.    Activity: As tolerated with Full fall precautions use walker/cane & assistance as needed   Disposition Home     Diet: Heart Healthy Low Carb  Accuchecks 4 times/day, Once in AM empty stomach and then before each meal. Log in all results and show them to your Prim.MD in 3 days. If any glucose reading is under 80 or above 300 call your Prim MD immidiately. Follow Low glucose instructions for glucose under 80 as instructed.    For Heart failure patients - Check your Weight same time everyday, if you gain over 2 pounds, or you develop in leg swelling, experience more shortness of breath or chest pain, call your Primary MD immediately. Follow Cardiac Low Salt Diet and 1.5 lit/day fluid restriction.   On your next visit with your primary care physician please Get Medicines reviewed and adjusted.   Please  request your Prim.MD to go over all Hospital Tests and Procedure/Radiological results at the follow up, please get all Hospital records sent to your Prim MD by signing hospital release before you go home.   If you experience worsening of your admission symptoms, develop shortness of breath, life threatening emergency, suicidal or homicidal thoughts you must seek medical attention immediately by calling 911 or calling your MD immediately  if symptoms less severe.  You Must read complete instructions/literature along with all the possible adverse reactions/side effects for all the Medicines you take and that have been prescribed to you. Take any new Medicines after you have completely understood and accpet all the possible adverse reactions/side effects.   Do not drive, operating heavy machinery, perform activities at heights, swimming or participation in water activities or provide baby sitting services if your were admitted for syncope or siezures until you have seen by Primary MD or a Neurologist and advised to do so again.  Do not drive when taking Pain medications.    Do not take more than prescribed Pain, Sleep and Anxiety Medications  Special Instructions: If you have smoked or chewed Tobacco  in the last 2 yrs please stop smoking, stop any regular Alcohol  and or any Recreational drug use.  Wear Seat belts while driving.   Please note  You were cared for by a hospitalist during your hospital stay. If you have any questions about your discharge medications or the care you received while you were in the hospital after you are discharged, you can call the unit and asked to speak with the hospitalist on call if the hospitalist that took care of you is not available. Once you are discharged, your primary care physician will handle any further medical issues. Please note that NO REFILLS for any discharge medications will be authorized once you are discharged, as it  is imperative that you return  to your primary care physician (or establish a relationship with a primary care physician if you do not have one) for your aftercare needs so that they can reassess your need for medications and monitor your lab values.     Increase activity slowly    Complete by:  As directed              Discharge Medications       Medication List    TAKE these medications        aspirin EC 81 MG tablet  Take 1 tablet (81 mg total) by mouth daily.     atorvastatin 80 MG tablet  Commonly known as:  LIPITOR  Take 1 tablet (80 mg total) by mouth daily.     carvedilol 25 MG tablet  Commonly known as:  COREG  Take 1.5 tablets (37.5 mg total) by mouth 2 (two) times daily.     CLARITIN-D 12 HOUR PO  Take 1 tablet by mouth daily as needed (allergies).     clotrimazole-betamethasone cream  Commonly known as:  LOTRISONE  Apply topically 2 (two) times daily.     doxycycline 100 MG capsule  Commonly known as:  VIBRAMYCIN  Take 1 capsule (100 mg total) by mouth 2 (two) times daily.     furosemide 80 MG tablet  Commonly known as:  LASIX  Take 80 mg by mouth daily.     hydrALAZINE 10 MG tablet  Commonly known as:  APRESOLINE  Take 1 tablet (10 mg total) by mouth 3 (three) times daily.     insulin aspart 100 UNIT/ML injection  Commonly known as:  novoLOG  Inject 18 Units into the skin 3 (three) times daily before meals.     NOVOLOG FLEXPEN 100 UNIT/ML FlexPen  Generic drug:  insulin aspart  Inject 18 Units into the skin 3 (three) times daily.     insulin detemir 100 UNIT/ML injection  Commonly known as:  LEVEMIR  Inject 40 Units into the skin at bedtime.     LEVEMIR FLEXTOUCH 100 UNIT/ML Pen  Generic drug:  Insulin Detemir  Inject 40 Units into the skin daily at 10 pm.     KLOR-CON 10 10 MEQ tablet  Generic drug:  potassium chloride  Take 10 mEq by mouth daily.     losartan 25 MG tablet  Commonly known as:  COZAAR  Take 1 tablet (25 mg total) by mouth daily.     nitroGLYCERIN  0.2 mg/hr patch  Commonly known as:  NITRODUR - Dosed in mg/24 hr  Place 0.2 mg onto the skin 2 days.     pantoprazole 40 MG tablet  Commonly known as:  PROTONIX  Take 1 tablet (40 mg total) by mouth daily.     polyethylene glycol packet  Commonly known as:  MIRALAX / GLYCOLAX  Take 17 g by mouth daily as needed for mild constipation.     potassium chloride 10 MEQ tablet  Commonly known as:  K-DUR,KLOR-CON  Take 1 tablet (10 mEq total) by mouth daily.     promethazine 25 MG tablet  Commonly known as:  PHENERGAN  Take 1 tab every 6-8 hours for nausea.     senna-docusate 8.6-50 MG per tablet  Commonly known as:  Senokot-S  Take 1 tablet by mouth daily as needed for mild constipation.     tamsulosin 0.4 MG Caps capsule  Commonly known as:  FLOMAX  Take 0.4 mg by mouth every  evening.        Major procedures and Radiology Reports - PLEASE review detailed and final reports for all details, in brief -    No results found.  Micro Results      Recent Results (from the past 240 hour(s))  Culture, blood (routine x 2)     Status: None (Preliminary result)   Collection Time: 05/16/15  9:17 PM  Result Value Ref Range Status   Specimen Description BLOOD RIGHT HAND  Final   Special Requests IN PEDIATRIC BOTTLE 2CC  Final   Culture NO GROWTH 2 DAYS  Final   Report Status PENDING  Incomplete  Culture, blood (routine x 2)     Status: None (Preliminary result)   Collection Time: 05/16/15  9:20 PM  Result Value Ref Range Status   Specimen Description BLOOD LEFT ARM  Final   Special Requests   Final    BOTTLES DRAWN AEROBIC AND ANAEROBIC 5CC BLUE AND 3CC RED   Culture NO GROWTH 2 DAYS  Final   Report Status PENDING  Incomplete       Today   Subjective    Albert [name] today has no headache,no chest abdominal pain,no new weakness tingling or numbness, feels much better wants to go home today.    Objective   Blood pressure 158/87, pulse 85, temperature 98.3 F (36.8 C),  temperature source Oral, resp. rate 18, height 5\' 11"  (1.803 m), weight 111.8 kg (246 lb 7.6 oz), SpO2 98 %.   Intake/Output Summary (Last 24 hours) at 05/19/15 0856 Last data filed at 05/19/15 0855  Gross per 24 hour  Intake 4336.67 ml  Output   3250 ml  Net 1086.67 ml    Exam Awake Alert, Oriented x 3, No new F.N deficits, Normal affect [place].AT,PERRAL Supple Neck,No JVD, No cervical lymphadenopathy appriciated.  Symmetrical Chest wall movement, Good air movement bilaterally, CTAB RRR,No Gallops,Rubs or new Murmurs, No Parasternal Heave +ve B.Sounds, Abd Soft, Non tender, No organomegaly appriciated, No rebound -guarding or rigidity. No Cyanosis, Clubbing or edema, No new Rash or bruise BKA stump appears stable, left old transmetatarsal amputation, left heel ulcer under bandage, left leg cellulitis with some warmth and redness below the knee, marked out and seems to be improving    Data Review   CBC w Diff: Lab Results  Component Value Date   WBC 9.5 05/17/2015   WBC 9.5 02/05/2014   HGB 12.0* 05/17/2015   HCT 36.8* 05/17/2015   PLT 221 05/17/2015   LYMPHOPCT 16 05/16/2015   MONOPCT 8 05/16/2015   EOSPCT 2 05/16/2015   BASOPCT 0 05/16/2015    CMP: Lab Results  Component Value Date   NA 137 05/17/2015   NA 137 02/05/2014   K 3.9 05/17/2015   CL 108 05/17/2015   CO2 21* 05/17/2015   BUN 22* 05/17/2015   BUN 44* 02/05/2014   CREATININE 1.18 05/17/2015   PROT 6.3* 05/17/2015   ALBUMIN 3.1* 05/17/2015   BILITOT 0.4 05/17/2015   ALKPHOS 80 05/17/2015   AST 17 05/17/2015   ALT 27 05/17/2015  .   Total Time in preparing paper work, data evaluation and todays exam - 35 minutes  Thurnell Lose M.D on 05/19/2015 at 8:56 AM  [facility]   Office  [phone]

## 2015-05-21 LAB — CULTURE, BLOOD (ROUTINE X 2)
Culture: NO GROWTH
Culture: NO GROWTH

## 2015-06-01 DIAGNOSIS — Z89432 Acquired absence of left foot: Secondary | ICD-10-CM | POA: Diagnosis not present

## 2015-06-01 DIAGNOSIS — L02416 Cutaneous abscess of left lower limb: Secondary | ICD-10-CM | POA: Diagnosis not present

## 2015-06-01 DIAGNOSIS — L97522 Non-pressure chronic ulcer of other part of left foot with fat layer exposed: Secondary | ICD-10-CM | POA: Diagnosis not present

## 2015-06-01 DIAGNOSIS — M86272 Subacute osteomyelitis, left ankle and foot: Secondary | ICD-10-CM | POA: Diagnosis not present

## 2015-06-24 DIAGNOSIS — R944 Abnormal results of kidney function studies: Secondary | ICD-10-CM | POA: Diagnosis not present

## 2015-06-29 ENCOUNTER — Encounter: Payer: Self-pay | Admitting: *Deleted

## 2015-06-29 ENCOUNTER — Encounter: Payer: Self-pay | Admitting: Cardiology

## 2015-06-29 ENCOUNTER — Ambulatory Visit (INDEPENDENT_AMBULATORY_CARE_PROVIDER_SITE_OTHER): Payer: Medicare Other | Admitting: Cardiology

## 2015-06-29 VITALS — BP 113/75 | HR 66 | Ht 71.0 in | Wt 253.8 lb

## 2015-06-29 DIAGNOSIS — I10 Essential (primary) hypertension: Secondary | ICD-10-CM

## 2015-06-29 DIAGNOSIS — I340 Nonrheumatic mitral (valve) insufficiency: Secondary | ICD-10-CM

## 2015-06-29 DIAGNOSIS — E1142 Type 2 diabetes mellitus with diabetic polyneuropathy: Secondary | ICD-10-CM | POA: Diagnosis not present

## 2015-06-29 DIAGNOSIS — E7800 Pure hypercholesterolemia, unspecified: Secondary | ICD-10-CM

## 2015-06-29 DIAGNOSIS — I5022 Chronic systolic (congestive) heart failure: Secondary | ICD-10-CM

## 2015-06-29 DIAGNOSIS — L02416 Cutaneous abscess of left lower limb: Secondary | ICD-10-CM | POA: Diagnosis not present

## 2015-06-29 DIAGNOSIS — I2510 Atherosclerotic heart disease of native coronary artery without angina pectoris: Secondary | ICD-10-CM

## 2015-06-29 DIAGNOSIS — L97522 Non-pressure chronic ulcer of other part of left foot with fat layer exposed: Secondary | ICD-10-CM | POA: Diagnosis not present

## 2015-06-29 NOTE — Patient Instructions (Signed)
Your physician recommends that you schedule a follow-up appointment in: 2 MONTHS WITH DR. [place]  Your physician recommends that you continue on your current medications as directed. Please refer to the Current Medication list given to you today.  WE WILL REQUEST LABS/NOTES   Thank you for choosing [facility]!!

## 2015-06-29 NOTE — Progress Notes (Signed)
Patient ID: Albert [name], male   DOB: [birth year], 54 y.o.   MRN: [id number]     Clinical Summary Albert [name] is a 54 y.o.male seen today for follow up of the following medical problems.   1. Chronic combined systolic/diastolic heart failure - ICM, LVEF 40-45% by echo 12/2013, grade II diastolic dysfunction  - denies any SOB, no DOE, no LE edema.   2. Mitral regurgitation - moderate by TEE 06/2013 and by TTE 12/2013 - denies any significant symptoms  3. CAD - hx of demand ischemia 06/2013 in setting of sepsis with necrotic foot wound. - cath at that time 06/2013 LM patent, LAD 40% prox, 70% mid follow by 60% lesion, distal 90%. Diag 99%. LCX 80-90% prox, OM1 80%, OM2 99%, OM3 99 % (all OMs are small), RCA small non-dom mid 99%. LVEF 40% by LVgram.  - CAD was managed medically after discussions with CT surgery at that time, with plans to reevaluate once recurring infection issues resolved.   - denies any recent chest pain  4. PAD - followed by vascular and ortho - previous right BKA, previous multiple toe amputations. 08/30/14 treated for osteo and ulcer of left great toe with amputation. Jan 2016 had left midfoot amputation.  - reports foot is healing well.   5. Hyperlipidemia - compliant with statin - Lipid panel 12/2014 TC 92 TG 108 HDL 24 LDL 46  6. HTN - does not check at home.  - compliant with meds  7. Cellulitis - treated with abx during recent admission   Past Medical History  Diagnosis Date  . Hypertension   . Hypercholesteremia   . CVA (cerebral infarction) 2011    R hand deficit  . Chronic sinusitis   . Allergic rhinitis   . Chronic cough   . Complication of anesthesia     couldn't swallow and talk  . Splenic infarct     setting of lupus anticoagulant  . Myocardial infarction   . Stroke     right arm weakness  . Pneumonia     hx of walking  . Diabetes mellitus without complication ~9509    last HbA1c ~9 .... fasting 160s  . GERD (gastroesophageal  reflux disease)   . Anemia     takes supplement  . Osteomyelitis     first and third metatarsal     Allergies  Allergen Reactions  . Amoxicillin Nausea And Vomiting     Current Outpatient Prescriptions  Medication Sig Dispense Refill  . aspirin EC 81 MG tablet Take 1 tablet (81 mg total) by mouth daily. 30 tablet 1  . atorvastatin (LIPITOR) 80 MG tablet Take 1 tablet (80 mg total) by mouth daily. 30 tablet 6  . carvedilol (COREG) 25 MG tablet Take 1.5 tablets (37.5 mg total) by mouth 2 (two) times daily. 90 tablet 6  . clotrimazole-betamethasone (LOTRISONE) cream Apply topically 2 (two) times daily.  5  . doxycycline (VIBRAMYCIN) 100 MG capsule Take 1 capsule (100 mg total) by mouth 2 (two) times daily. 20 capsule 0  . furosemide (LASIX) 80 MG tablet Take 80 mg by mouth daily.    . hydrALAZINE (APRESOLINE) 10 MG tablet Take 1 tablet (10 mg total) by mouth 3 (three) times daily. 120 tablet 1  . insulin aspart (NOVOLOG) 100 UNIT/ML injection Inject 18 Units into the skin 3 (three) times daily before meals.     . insulin detemir (LEVEMIR) 100 UNIT/ML injection Inject 40 Units into the skin at bedtime.     Marland Kitchen  KLOR-CON 10 10 MEQ tablet Take 10 mEq by mouth daily.  6  . LEVEMIR FLEXTOUCH 100 UNIT/ML Pen Inject 40 Units into the skin daily at 10 pm.   5  . Loratadine-Pseudoephedrine (CLARITIN-D 12 HOUR PO) Take 1 tablet by mouth daily as needed (allergies).     . losartan (COZAAR) 25 MG tablet Take 1 tablet (25 mg total) by mouth daily. 30 tablet 3  . nitroGLYCERIN (NITRODUR - DOSED IN MG/24 HR) 0.2 mg/hr patch Place 0.2 mg onto the skin 2 days.     Marland Kitchen NOVOLOG FLEXPEN 100 UNIT/ML FlexPen Inject 18 Units into the skin 3 (three) times daily.   5  . pantoprazole (PROTONIX) 40 MG tablet Take 1 tablet (40 mg total) by mouth daily. 30 tablet 1  . polyethylene glycol (MIRALAX / GLYCOLAX) packet Take 17 g by mouth daily as needed for mild constipation.     . potassium chloride (K-DUR,KLOR-CON) 10 MEQ  tablet Take 1 tablet (10 mEq total) by mouth daily. 30 tablet 6  . promethazine (PHENERGAN) 25 MG tablet Take 1 tab every 6-8 hours for nausea. (Patient taking differently: Take 25 mg by mouth every 6 (six) hours as needed for nausea. Take 1 tab every 6-8 hours for nausea.) 40 tablet 1  . senna-docusate (SENOKOT-S) 8.6-50 MG per tablet Take 1 tablet by mouth daily as needed for mild constipation.     . tamsulosin (FLOMAX) 0.4 MG CAPS capsule Take 0.4 mg by mouth every evening.  5   No current facility-administered medications for this visit.     Past Surgical History  Procedure Laterality Date  . 4th toe amputation Left Jan. 2014    4th toe in [place]  . Tonsillectomy and adenoidectomy    . Penile prosthesis implant    . I&d extremity Left 12/08/2012    Procedure: IRRIGATION AND DEBRIDEMENT EXTREMITY;  Surgeon: Mcarthur Rossetti, MD;  Location: [place];  Service: Orthopedics;  Laterality: Left;  . Amputation Left 12/08/2012    Procedure: AMPUTATION DIGIT;  Surgeon: Mcarthur Rossetti, MD;  Location: [facility];  Service: Orthopedics;  Laterality: Left;  3rd toe amputation possible 5th toe amputation  . I&d extremity Left 12/11/2012    Procedure: REPEAT I&D LEFT FOOT;  Surgeon: Mcarthur Rossetti, MD;  Location: [place];  Service: Orthopedics;  Laterality: Left;  . Amputation Left 12/11/2012    Procedure: Amputation of 2nd Toe;  Surgeon: Mcarthur Rossetti, MD;  Location: [place];  Service: Orthopedics;  Laterality: Left;  . Bypass graft popliteal to tibial Left 12/13/2012    Procedure: BYPASS GRAFT POPLITEAL TO TIBIAL;  Surgeon: Serafina Mitchell, MD;  Location: [facility];  Service: Vascular;  Laterality: Left;  Left Popliteal to Posterior Tibial Bypass Graft with reversed saphenous vein graft  . Incision and drainage Right 06/25/2013    Procedure: INCISION AND DRAINAGE RIGHT FOOT;  Surgeon: Mcarthur Rossetti, MD;  Location: [facility];  Service: Orthopedics;  Laterality: Right;  . Amputation Right  06/29/2013    Procedure: AMPUTATION BELOW KNEE;  Surgeon: Newt Minion, MD;  Location: [place];  Service: Orthopedics;  Laterality: Right;  . Tee without cardioversion [name] 07/03/2013    Procedure: TRANSESOPHAGEAL ECHOCARDIOGRAM (TEE);  Surgeon: Larey Dresser, MD;  Location: [place];  Service: Cardiovascular;  Laterality: [name];  . Stump revision Right 08/16/2013    Procedure: STUMP REVISION- right Below Knee Amputation;  Surgeon: Newt Minion, MD;  Location: [place];  Service: Orthopedics;  Laterality: Right;  .  Cardiac catheterization    . Amputation Left 08/29/2014    Procedure: Left Great Toe Amputation at MTP;  Surgeon: Newt Minion, MD;  Location: [place];  Service: Orthopedics;  Laterality: Left;  . Left heart catheterization with coronary angiogram [name] 06/28/2013    Procedure: LEFT HEART CATHETERIZATION WITH CORONARY ANGIOGRAM;  Surgeon: Burnell Blanks, MD;  Location[facility];  Service: Cardiovascular;  Laterality: [name];  . Amputation Left 10/31/2014    Procedure: Midfoot Amputation;  Surgeon: Newt Minion, MD;  Location: [place];  Service: Orthopedics;  Laterality: Left;     Allergies  Allergen Reactions  . Amoxicillin Nausea And Vomiting      Family History  Problem Relation Age of Onset  . Diabetes    . Pancreatic cancer Mother   . Diabetes Mother   . Hyperlipidemia Mother   . Breast cancer Sister   . Depression Maternal Grandmother   . Heart disease Maternal Grandmother   . Depression Maternal Grandfather   . Heart disease Maternal Grandfather   . Depression Paternal Grandmother   . Heart disease Paternal Grandmother   . Depression Paternal Grandfather   . Heart disease Paternal Grandfather      Social History Albert [name] reports that he has never smoked. He has never used smokeless tobacco. Albert [name] reports that he does not drink alcohol.   Review of Systems CONSTITUTIONAL: No weight loss, fever, chills, weakness or fatigue.  HEENT: Eyes: No visual  loss, blurred vision, double vision or yellow sclerae.No hearing loss, sneezing, congestion, runny nose or sore throat.  SKIN: No rash or itching.  CARDIOVASCULAR: per HPI RESPIRATORY: No shortness of breath, cough or sputum.  GASTROINTESTINAL: No anorexia, nausea, vomiting or diarrhea. No abdominal pain or blood.  GENITOURINARY: No burning on urination, no polyuria NEUROLOGICAL: No headache, dizziness, syncope, paralysis, ataxia, numbness or tingling in the extremities. No change in bowel or bladder control.  MUSCULOSKELETAL: No muscle, back pain, joint pain or stiffness.  LYMPHATICS: No enlarged nodes. No history of splenectomy.  PSYCHIATRIC: No history of depression or anxiety.  ENDOCRINOLOGIC: No reports of sweating, cold or heat intolerance. No polyuria or polydipsia.  Marland Kitchen   Physical Examination Filed Vitals:   06/29/15 1611  BP: 113/75  Pulse: 66   Filed Vitals:   06/29/15 1611  Height: 5\' 11"  (1.803 m)  Weight: 253 lb 12.8 oz (115.123 kg)    Gen: resting comfortably, no acute distress HEENT: no scleral icterus, pupils equal round and reactive, no palptable cervical adenopathy,  CV: RRR, no m/r/g, no jvd. Right carotid bruit Resp: Clear to auscultation bilaterally GI: abdomen is soft, non-tender, non-distended, normal bowel sounds, no hepatosplenomegaly MSK: extremities are warm, no edema.  Skin: warm, no rash Neuro:  no focal deficits Psych: appropriate affect   Diagnostic Studies 06/2013 Cath Hemodynamic Findings: Central aortic pressure: 90/61 Left ventricular pressure: 90/19/29  Angiographic Findings:  Left main: Short segment without obstruction.   Left Anterior Descending Artery: Moderate caliber diffusely diseased vessel that courses to the apex. The proximal vessel has diffuse 40% stenosis. The mid vessel has a 70% stenosis followed by a 60% stenosis. The distal vessel has diffuse 90% stenosis. The diagonal is a moderate caliber vessel with ostial 99%  stenosis with 99% disease extending into the mid vessel.   Circumflex Artery: Large caliber vessel with proximal 80-90% stenosis. There are three small to moderate caliber obtuse marginal branches. The first OM has mid 80% stenosis. The second OM has a proximal  99% stenosis. The third OM has diffuse 99% stenosis. The left sided posterolateral branch has 99% stenosis.   Right Coronary Artery: Small non-dominant vessel with mid 99% stenosis.   Left Ventricular Angiogram: LVEF=40%  Impression: 1. Triple vessel CAD 2. NSTEMI secondary to demand ischemia from sepsis with severe underlying CAD 3. Mild LV systolic dysfunction  Recommendations: Complex situation. The only option for coronary revascularization is CABG. He is not a favorable candidate for CABG at this time with necrotic right foot, sepsis. Even though not ideal, would favor medical management of CAD while undergoing treatment of his necrotic foot. Would proceed with right BKA this weekend. I have discussed the case with CT surgery who agrees that CABG is not a good option at this time. Will ask CT surgery to see after he recovers from his BKA. Continue ASA, beta blocker, statin.    Complications: None. The patient tolerated the procedure well.     11/2012 Carotid US Summary:  - No significant extracranial carotid artery stenosis demonstrated. Vertebrals are patent with antegrade flow. - ICA/CCA ratio= right = 0.94. left = 0.73. Other specific details can be found in the table(s) above.  Prepared and Electronically Authenticated by  03/2015 Carotid US IMPRESSION: Mild atherosclerotic disease in the carotid arteries, right side greater the left. The peak systolic velocity in the proximal right internal carotid artery is slightly elevated. Estimated degree of stenosis in the right internal carotid artery is close to 50%.  Estimated degree of stenosis in the left internal carotid artery is less than  50%.  Patent vertebral arteries.     Assessment and Plan  1. Chronic systolic heart failure - ICM, LVEF 40-45% by echo 12/2013 with grade II diastolic dysfunction. NYHA II. Currently appears euvolemic - we will ontinue current meds  2. CAD - triple vessel CAD managed medically. Seen by CT surgery, due to ongoing recurrent LE leg infections and multiple surgeries, as well as overall high risk recs were for medical therapy, reconsideration if other comorbidities significanlty improved - continue current meds, continue to follow medically  3. Hyperlipidemia - continue high dose statin in setting of known CAD and PAD.  4. PAD - per vascular and ortho  5. Mitral regurgitation - moderate by recent imaging, no clinical symptoms at this time - continue to follow  6. HTN - at goal, continue current meds   F/u 2 months      Arnoldo Lenis, M.D.

## 2015-07-27 DIAGNOSIS — L97522 Non-pressure chronic ulcer of other part of left foot with fat layer exposed: Secondary | ICD-10-CM | POA: Diagnosis not present

## 2015-07-30 DIAGNOSIS — E782 Mixed hyperlipidemia: Secondary | ICD-10-CM | POA: Diagnosis not present

## 2015-07-30 DIAGNOSIS — E119 Type 2 diabetes mellitus without complications: Secondary | ICD-10-CM | POA: Diagnosis not present

## 2015-07-30 DIAGNOSIS — Z125 Encounter for screening for malignant neoplasm of prostate: Secondary | ICD-10-CM | POA: Diagnosis not present

## 2015-08-04 DIAGNOSIS — I25810 Atherosclerosis of coronary artery bypass graft(s) without angina pectoris: Secondary | ICD-10-CM | POA: Diagnosis not present

## 2015-08-04 DIAGNOSIS — E119 Type 2 diabetes mellitus without complications: Secondary | ICD-10-CM | POA: Diagnosis not present

## 2015-08-04 DIAGNOSIS — F5221 Male erectile disorder: Secondary | ICD-10-CM | POA: Diagnosis not present

## 2015-08-04 DIAGNOSIS — S98919A Complete traumatic amputation of unspecified foot, level unspecified, initial encounter: Secondary | ICD-10-CM | POA: Diagnosis not present

## 2015-08-13 DIAGNOSIS — L97522 Non-pressure chronic ulcer of other part of left foot with fat layer exposed: Secondary | ICD-10-CM | POA: Diagnosis not present

## 2015-08-13 DIAGNOSIS — Z89422 Acquired absence of other left toe(s): Secondary | ICD-10-CM | POA: Diagnosis not present

## 2015-08-13 DIAGNOSIS — M86272 Subacute osteomyelitis, left ankle and foot: Secondary | ICD-10-CM | POA: Diagnosis not present

## 2015-08-13 DIAGNOSIS — L02416 Cutaneous abscess of left lower limb: Secondary | ICD-10-CM | POA: Diagnosis not present

## 2015-08-17 IMAGING — CR DG FOOT COMPLETE 3+V*R*
3 series · 3 of 3 positions shown · non-contrast
Comparison: None.

CLINICAL DATA: Foot abscess; diabetes mellitus

RIGHT FOOT COMPLETE - 3+ VIEW

[x foot ap right]
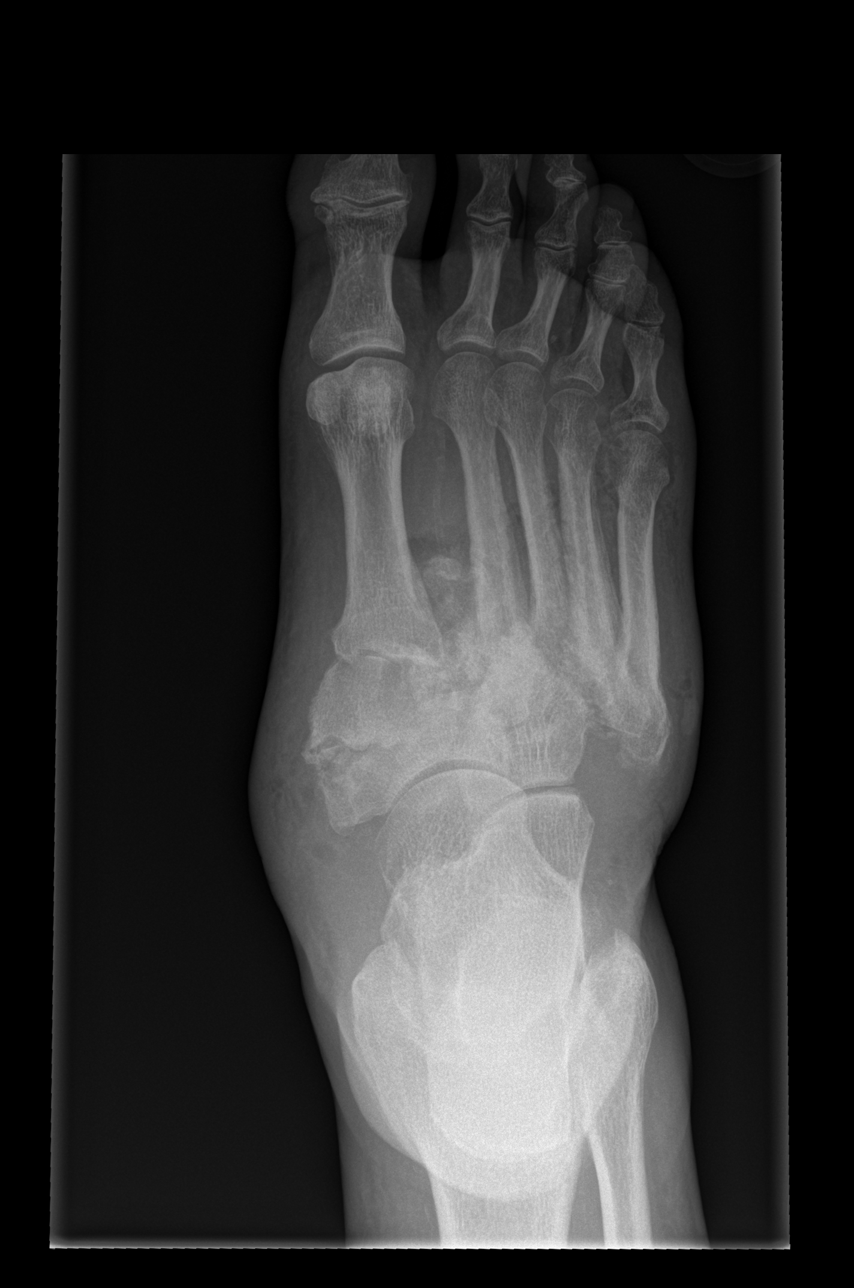

[x foot obl right]
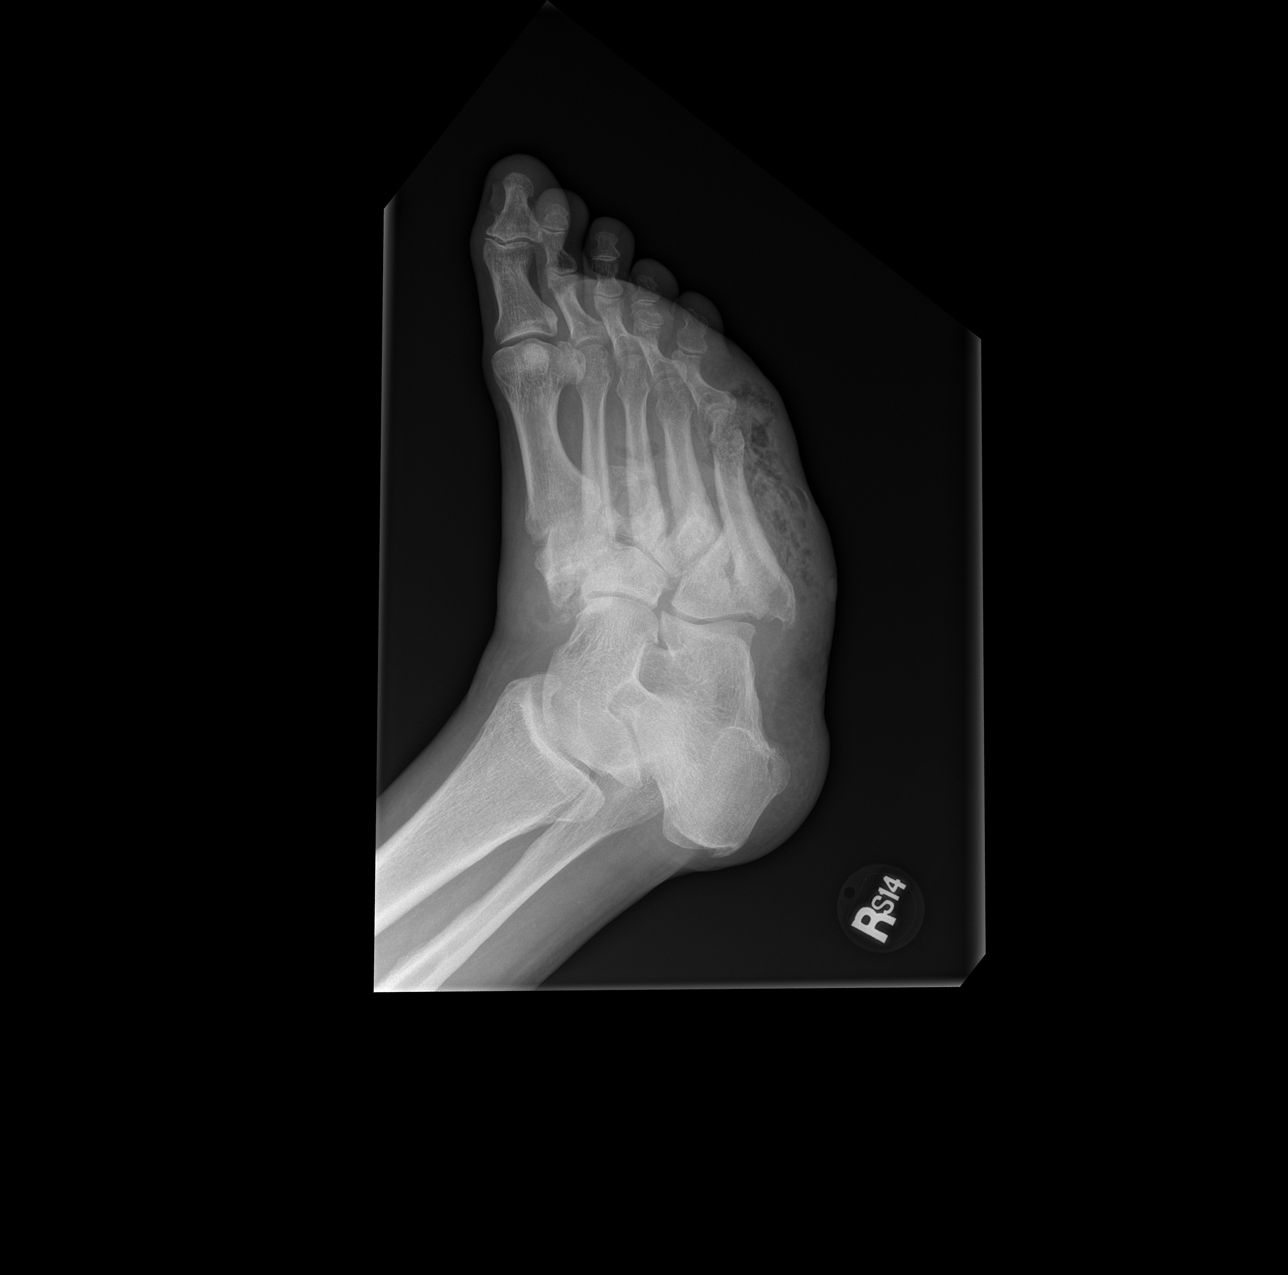

[x foot lat right]
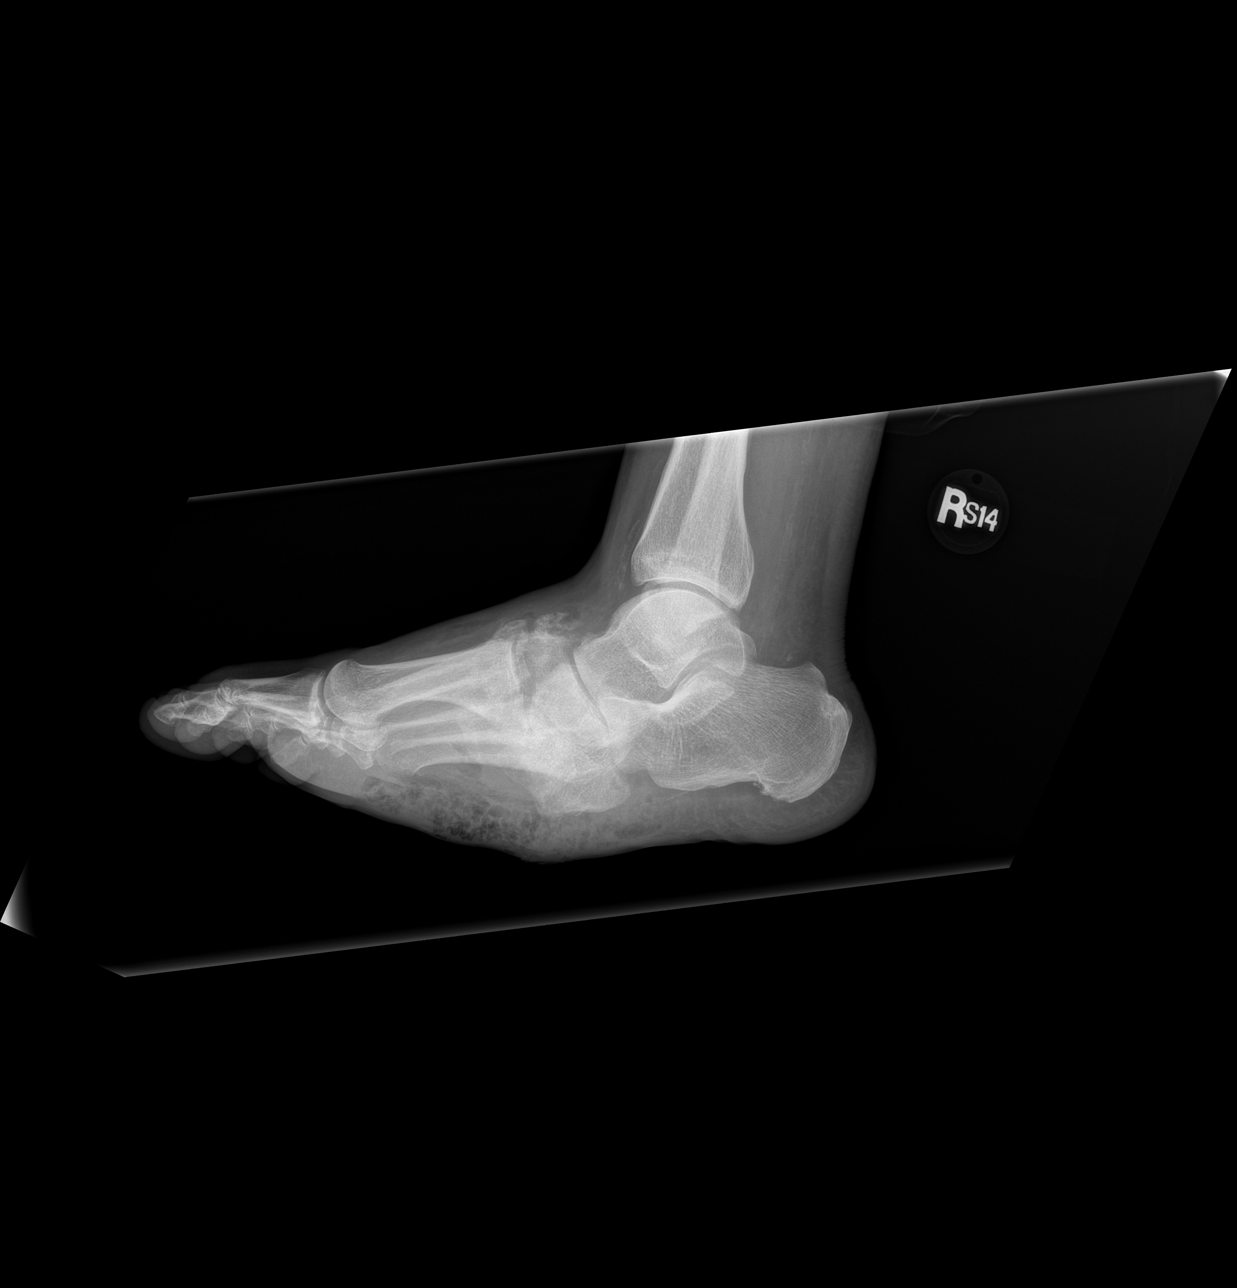

[3 of 3 positions shown; findings below may reference images not displayed]

FINDINGS: Frontal, oblique, lateral views were obtained.  There are
Charcot changes throughout the midfoot.  There is fragmentation in
the tarsal - metatarsal region with a divergent Lisfranc type
fracture in the mid foot between the first and second metatarsals.

There is extensive soft tissue air throughout the volar aspect of
the foot consistent with widespread abscess.  There is no well-
defined osteomyelitis on this study.

There is narrowing of all PIP and DIP joints.

There is pes planus.
IMPRESSION: Extensive soft tissue air consistent with abscess tracking along
the volar aspect of the foot somewhat diffusely but concentrated
primarily at the level of the metatarsals.

No overt osteomyelitis.

Charcot changes with fragmentation of the mid foot region.
Lisfranc type separation between the first and second metatarsals.

Pes planus.

Multilevel osteoarthritic change in the PIP and DIP joints.

## 2015-08-24 DIAGNOSIS — L02416 Cutaneous abscess of left lower limb: Secondary | ICD-10-CM | POA: Diagnosis not present

## 2015-08-24 DIAGNOSIS — L97522 Non-pressure chronic ulcer of other part of left foot with fat layer exposed: Secondary | ICD-10-CM | POA: Diagnosis not present

## 2015-08-31 ENCOUNTER — Encounter: Payer: Self-pay | Admitting: Cardiology

## 2015-08-31 ENCOUNTER — Ambulatory Visit (INDEPENDENT_AMBULATORY_CARE_PROVIDER_SITE_OTHER): Payer: Medicare Other | Admitting: Cardiology

## 2015-08-31 ENCOUNTER — Encounter: Payer: Self-pay | Admitting: *Deleted

## 2015-08-31 VITALS — BP 166/69 | HR 64 | Ht 71.0 in | Wt 253.0 lb

## 2015-08-31 DIAGNOSIS — I10 Essential (primary) hypertension: Secondary | ICD-10-CM

## 2015-08-31 DIAGNOSIS — I2510 Atherosclerotic heart disease of native coronary artery without angina pectoris: Secondary | ICD-10-CM | POA: Diagnosis not present

## 2015-08-31 DIAGNOSIS — I5022 Chronic systolic (congestive) heart failure: Secondary | ICD-10-CM | POA: Diagnosis not present

## 2015-08-31 MED ORDER — LOSARTAN POTASSIUM 50 MG PO TABS: 50.0000 mg | ORAL_TABLET | Freq: Every day | ORAL | Status: DC

## 2015-08-31 NOTE — Patient Instructions (Signed)
Your physician recommends that you schedule a follow-up appointment in: 3 months with Dr. Harl Bowie  Your physician has recommended you make the following change in your medication:   INCREASE LOSARTAN 25 [place] physician recommends that you return for lab work in: 2 WEEKS BMP  Thank you for choosing [facility]!!

## 2015-08-31 NOTE — Progress Notes (Signed)
Patient ID: Albert [name], male   DOB: 12/04/1960, 54 y.o.   MRN: [id number]     Clinical Summary [name] is a 54 y.o.male seen today for follow up of the following medical problems.   1. Chronic combined systolic/diastolic heart failure - ICM, LVEF 40-45% by echo 12/2013, grade II diastolic dysfunction  - no SOB or DOE. Can have some occasional leg swelling. No orthopnea, no PND - compliant with meds -weights stable at 253 lbs   2. Mitral regurgitation - moderate by TEE 06/2013 and by TTE 12/2013 - denies any significant symptoms  3. CAD - hx of demand ischemia 06/2013 in setting of sepsis with necrotic foot wound. - cath at that time 06/2013 LM patent, LAD 40% prox, 70% mid follow by 60% lesion, distal 90%. Diag 99%. LCX 80-90% prox, OM1 80%, OM2 99%, OM3 99 % (all OMs are small), RCA small non-dom mid 99%. LVEF 40% by LVgram.  - CAD was managed medically after discussions with CT surgery at that time, with plans to reevaluate once recurring infection issues resolved.   - denies any recent chest pain since last visit  4. PAD - followed by vascular and ortho - previous right BKA, previous multiple toe amputations. 08/30/14 treated for osteo and ulcer of left great toe with amputation. Jan 2016 had left midfoot amputation.  - reports foot is healing well.   - continues to deal with left foot sore. Followed by ortho.    5. Hyperlipidemia - compliant with statin - Lipid panel 12/2014 TC 92 TG 108 HDL 24 LDL 46  6. HTN - does not check at home.  - compliant with meds   Past Medical History  Diagnosis Date  . Hypertension   . Hypercholesteremia   . CVA (cerebral infarction) 2011    R hand deficit  . Chronic sinusitis   . Allergic rhinitis   . Chronic cough   . Complication of anesthesia     couldn't swallow and talk  . Splenic infarct     setting of lupus anticoagulant  . Myocardial infarction ([place])   . Stroke     right arm weakness  . Pneumonia     hx of  walking  . Diabetes mellitus without complication [id number]    last HbA1c ~9 .... fasting 160s  . GERD (gastroesophageal reflux disease)   . Anemia     takes supplement  . Osteomyelitis     first and third metatarsal     Allergies  Allergen Reactions  . Amoxicillin Nausea And Vomiting     Current Outpatient Prescriptions  Medication Sig Dispense Refill  . aspirin EC 81 MG tablet Take 1 tablet (81 mg total) by mouth daily. 30 tablet 1  . atorvastatin (LIPITOR) 80 MG tablet Take 1 tablet (80 mg total) by mouth daily. 30 tablet 6  . carvedilol (COREG) 25 MG tablet Take 25 mg by mouth daily.    . furosemide (LASIX) 80 MG tablet Take 80 mg by mouth daily.    . hydrALAZINE (APRESOLINE) 10 MG tablet Take 1 tablet (10 mg total) by mouth 3 (three) times daily. 120 tablet 1  . LEVEMIR FLEXTOUCH 100 UNIT/ML Pen Inject 40 Units into the skin daily at 10 pm.   5  . Loratadine-Pseudoephedrine (CLARITIN-D 12 HOUR PO) Take 1 tablet by mouth daily as needed (allergies).     . losartan (COZAAR) 25 MG tablet Take 1 tablet (25 mg total) by mouth daily. 30 tablet 3  .  nitroGLYCERIN (NITRODUR - DOSED IN MG/24 HR) 0.2 mg/hr patch Place 0.2 mg onto the skin 2 days.     Marland Kitchen NOVOLOG FLEXPEN 100 UNIT/ML FlexPen Inject 18 Units into the skin 3 (three) times daily.   5  . pantoprazole (PROTONIX) 40 MG tablet Take 1 tablet (40 mg total) by mouth daily. 30 tablet 1  . polyethylene glycol (MIRALAX / GLYCOLAX) packet Take 17 g by mouth daily as needed for mild constipation.     . potassium chloride (K-DUR,KLOR-CON) 10 MEQ tablet Take 1 tablet (10 mEq total) by mouth daily. 30 tablet 6  . promethazine (PHENERGAN) 25 MG tablet Take 1 tab every 6-8 hours for nausea. (Patient taking differently: Take 25 mg by mouth every 6 (six) hours as needed for nausea. Take 1 tab every 6-8 hours for nausea.) 40 tablet 1  . senna-docusate (SENOKOT-S) 8.6-50 MG per tablet Take 1 tablet by mouth daily as needed for mild constipation.       No current facility-administered medications for this visit.     Past Surgical History  Procedure Laterality Date  . 4th toe amputation Left Jan. 2014    4th toe in [place]  . Tonsillectomy and adenoidectomy    . Penile prosthesis implant    . I&d extremity Left 12/08/2012    Procedure: IRRIGATION AND DEBRIDEMENT EXTREMITY;  Surgeon: Albert Rossetti, MD;  Location: [facility];  Service: Orthopedics;  Laterality: Left;  . Amputation Left 12/08/2012    Procedure: AMPUTATION DIGIT;  Surgeon: Albert Rossetti, MD;  Location: [facility];  Service: Orthopedics;  Laterality: Left;  3rd toe amputation possible 5th toe amputation  . I&d extremity Left 12/11/2012    Procedure: REPEAT I&D LEFT FOOT;  Surgeon: Albert Rossetti, MD;  Location: [facility];  Service: Orthopedics;  Laterality: Left;  . Amputation Left 12/11/2012    Procedure: Amputation of 2nd Toe;  Surgeon: Albert Rossetti, MD;  Location: [facility];  Service: Orthopedics;  Laterality: Left;  . Bypass graft popliteal to tibial Left 12/13/2012    Procedure: BYPASS GRAFT POPLITEAL TO TIBIAL;  Surgeon: Albert Mitchell, MD;  Location: [facility];  Service: Vascular;  Laterality: Left;  Left Popliteal to Posterior Tibial Bypass Graft with reversed saphenous vein graft  . Incision and drainage Right 06/25/2013    Procedure: INCISION AND DRAINAGE RIGHT FOOT;  Surgeon: Albert Rossetti, MD;  Location: [facility];  Service: Orthopedics;  Laterality: Right;  . Amputation Right 06/29/2013    Procedure: AMPUTATION BELOW KNEE;  Surgeon: Albert Minion, MD;  Location: [facility];  Service: Orthopedics;  Laterality: Right;  . Tee without cardioversion [name] 07/03/2013    Procedure: TRANSESOPHAGEAL ECHOCARDIOGRAM (TEE);  Surgeon: Albert Dresser, MD;  Location: [facility];  Service: Cardiovascular;  Laterality: [name];  . Stump revision Right 08/16/2013    Procedure: STUMP REVISION- right Below Knee Amputation;  Surgeon: Albert Minion, MD;  Location: [facility];  Service:  Orthopedics;  Laterality: Right;  . Cardiac catheterization    . Amputation Left 08/29/2014    Procedure: Left Great Toe Amputation at MTP;  Surgeon: Albert Minion, MD;  Location: [facility];  Service: Orthopedics;  Laterality: Left;  . Left heart catheterization with coronary angiogram [name] 06/28/2013    Procedure: LEFT HEART CATHETERIZATION WITH CORONARY ANGIOGRAM;  Surgeon: Burnell Blanks, MD;  Location: [facility];  Service: Cardiovascular;  Laterality: [name];  . Amputation Left 10/31/2014    Procedure: Midfoot Amputation;  Surgeon: Albert Minion,  MD;  Location: [facility];  Service: Orthopedics;  Laterality: Left;     Allergies  Allergen Reactions  . Amoxicillin Nausea And Vomiting      Family History  Problem Relation Age of Onset  . Diabetes    . Pancreatic cancer Mother   . Diabetes Mother   . Hyperlipidemia Mother   . Breast cancer Sister   . Depression Maternal Grandmother   . Heart disease Maternal Grandmother   . Depression Maternal Grandfather   . Heart disease Maternal Grandfather   . Depression Paternal Grandmother   . Heart disease Paternal Grandmother   . Depression Paternal Grandfather   . Heart disease Paternal Grandfather      Social History Mr. Lebsack reports that he has never smoked. He has never used smokeless tobacco. Mr. Stclair reports that he does not drink alcohol.   Review of Systems CONSTITUTIONAL: No weight loss, fever, chills, weakness or fatigue.  HEENT: Eyes: No visual loss, blurred vision, double vision or yellow sclerae.No hearing loss, sneezing, congestion, runny nose or sore throat.  SKIN: No rash or itching.  CARDIOVASCULAR: per hpi RESPIRATORY: No shortness of breath, cough or sputum.  GASTROINTESTINAL: No anorexia, nausea, vomiting or diarrhea. No abdominal pain or blood.  GENITOURINARY: No burning on urination, no polyuria NEUROLOGICAL: No headache, dizziness, syncope, paralysis, ataxia, numbness or tingling in the extremities. No  change in bowel or bladder control.  MUSCULOSKELETAL: No muscle, back pain, joint pain or stiffness.  LYMPHATICS: No enlarged nodes. No history of splenectomy.  PSYCHIATRIC: No history of depression or anxiety.  ENDOCRINOLOGIC: No reports of sweating, cold or heat intolerance. No polyuria or polydipsia.  Marland Kitchen   Physical Examination Filed Vitals:   08/31/15 1605  BP: 166/69  Pulse: 64   Filed Vitals:   08/31/15 1605  Height: 5\' 11"  (1.803 m)  Weight: 253 lb (114.76 kg)    Gen: resting comfortably, no acute distress HEENT: no scleral icterus, pupils equal round and reactive, no palptable cervical adenopathy,  CV: RRR, no m/r/g, no jvd Resp: Clear to auscultation bilaterally GI: abdomen is soft, non-tender, non-distended, normal bowel sounds, no hepatosplenomegaly MSK: extremities are warm, no edema.  Skin: warm, no rash Neuro:  no focal deficits Psych: appropriate affect   Diagnostic Studies 06/2013 Cath Hemodynamic Findings: Central aortic pressure: 90/61 Left ventricular pressure: 90/19/29  Angiographic Findings:  Left main: Short segment without obstruction.   Left Anterior Descending Artery: Moderate caliber diffusely diseased vessel that courses to the apex. The proximal vessel has diffuse 40% stenosis. The mid vessel has a 70% stenosis followed by a 60% stenosis. The distal vessel has diffuse 90% stenosis. The diagonal is a moderate caliber vessel with ostial 99% stenosis with 99% disease extending into the mid vessel.   Circumflex Artery: Large caliber vessel with proximal 80-90% stenosis. There are three small to moderate caliber obtuse marginal branches. The first OM has mid 80% stenosis. The second OM has a proximal 99% stenosis. The third OM has diffuse 99% stenosis. The left sided posterolateral Shawnda Mauney has 99% stenosis.   Right Coronary Artery: Small non-dominant vessel with mid 99% stenosis.   Left Ventricular Angiogram: LVEF=40%  Impression: 1. Triple  vessel CAD 2. NSTEMI secondary to demand ischemia from sepsis with severe underlying CAD 3. Mild LV systolic dysfunction  Recommendations: Complex situation. The only option for coronary revascularization is CABG. He is not a favorable candidate for CABG at this time with necrotic right foot, sepsis. Even though not ideal, would favor medical  management of CAD while undergoing treatment of his necrotic foot. Would proceed with right BKA this weekend. I have discussed the case with CT surgery who agrees that CABG is not a good option at this time. Will ask CT surgery to see after he recovers from his BKA. Continue ASA, beta blocker, statin.    Complications: None. The patient tolerated the procedure well.     11/2012 Carotid US Summary:  - No significant extracranial carotid artery stenosis demonstrated. Vertebrals are patent with antegrade flow. - ICA/CCA ratio= right = 0.94. left = 0.73. Other specific details can be found in the table(s) above.  Prepared and Electronically Authenticated by  03/2015 Carotid US IMPRESSION: Mild atherosclerotic disease in the carotid arteries, right side greater the left. The peak systolic velocity in the proximal right internal carotid artery is slightly elevated. Estimated degree of stenosis in the right internal carotid artery is close to 50%.  Estimated degree of stenosis in the left internal carotid artery is less than 50%.  Patent vertebral arteries.       Assessment and Plan  1. Chronic systolic heart failure - ICM, LVEF 40-45% by echo 12/2013 with grade II diastolic dysfunction. NYHA II. Currently appears euvolemic - will increase losartan to 50mg  daily, check BMET in 2 weeks  2. CAD - triple vessel CAD managed medically. Seen by CT surgery, due to ongoing recurrent LE leg infections and multiple surgeries, as well as overall high risk recs were for medical therapy, reconsideration if other comorbidities  significanlty improved - continue current meds, continue to follow medically  3. Hyperlipidemia -at goal, continue high dose statin  4. PAD - per vascular and ortho  5. Mitral regurgitation - moderate by recent imaging, no clinical symptoms at this time - continue to follow  6. HTN - at goal, continue current meds     F/u 3 months  Arnoldo Lenis, MD

## 2015-09-15 ENCOUNTER — Encounter: Payer: Self-pay | Admitting: *Deleted

## 2015-09-16 DIAGNOSIS — I10 Essential (primary) hypertension: Secondary | ICD-10-CM | POA: Diagnosis not present

## 2015-09-18 ENCOUNTER — Telehealth: Payer: Self-pay | Admitting: *Deleted

## 2015-09-18 NOTE — Telephone Encounter (Signed)
Pt aware, routed to pcp 

## 2015-09-18 NOTE — Telephone Encounter (Signed)
-----   Message from Arnoldo Lenis, MD sent at 09/17/2015 10:14 AM EST ----- Labs look good  Zandra Abts MD

## 2015-09-21 DIAGNOSIS — M86272 Subacute osteomyelitis, left ankle and foot: Secondary | ICD-10-CM | POA: Diagnosis not present

## 2015-09-21 DIAGNOSIS — L97522 Non-pressure chronic ulcer of other part of left foot with fat layer exposed: Secondary | ICD-10-CM | POA: Diagnosis not present

## 2015-09-21 DIAGNOSIS — E1142 Type 2 diabetes mellitus with diabetic polyneuropathy: Secondary | ICD-10-CM | POA: Diagnosis not present

## 2015-09-21 DIAGNOSIS — Z89422 Acquired absence of other left toe(s): Secondary | ICD-10-CM | POA: Diagnosis not present

## 2015-10-23 DIAGNOSIS — L02416 Cutaneous abscess of left lower limb: Secondary | ICD-10-CM | POA: Diagnosis not present

## 2015-10-23 DIAGNOSIS — L97522 Non-pressure chronic ulcer of other part of left foot with fat layer exposed: Secondary | ICD-10-CM | POA: Diagnosis not present

## 2015-10-23 DIAGNOSIS — M86272 Subacute osteomyelitis, left ankle and foot: Secondary | ICD-10-CM | POA: Diagnosis not present

## 2015-10-29 DIAGNOSIS — E119 Type 2 diabetes mellitus without complications: Secondary | ICD-10-CM | POA: Diagnosis not present

## 2015-10-29 DIAGNOSIS — E782 Mixed hyperlipidemia: Secondary | ICD-10-CM | POA: Diagnosis not present

## 2015-11-03 DIAGNOSIS — S98919A Complete traumatic amputation of unspecified foot, level unspecified, initial encounter: Secondary | ICD-10-CM | POA: Diagnosis not present

## 2015-11-03 DIAGNOSIS — E876 Hypokalemia: Secondary | ICD-10-CM | POA: Diagnosis not present

## 2015-11-03 DIAGNOSIS — E119 Type 2 diabetes mellitus without complications: Secondary | ICD-10-CM | POA: Diagnosis not present

## 2015-11-30 DIAGNOSIS — Z89432 Acquired absence of left foot: Secondary | ICD-10-CM | POA: Diagnosis not present

## 2015-11-30 DIAGNOSIS — L97421 Non-pressure chronic ulcer of left heel and midfoot limited to breakdown of skin: Secondary | ICD-10-CM | POA: Diagnosis not present

## 2015-11-30 DIAGNOSIS — E1142 Type 2 diabetes mellitus with diabetic polyneuropathy: Secondary | ICD-10-CM | POA: Diagnosis not present

## 2015-12-07 ENCOUNTER — Ambulatory Visit (INDEPENDENT_AMBULATORY_CARE_PROVIDER_SITE_OTHER): Payer: Medicare Other | Admitting: Cardiology

## 2015-12-07 ENCOUNTER — Encounter: Payer: Self-pay | Admitting: Cardiology

## 2015-12-07 VITALS — BP 101/71 | HR 75 | Ht 71.0 in | Wt 257.0 lb

## 2015-12-07 DIAGNOSIS — I2510 Atherosclerotic heart disease of native coronary artery without angina pectoris: Secondary | ICD-10-CM | POA: Diagnosis not present

## 2015-12-07 DIAGNOSIS — I340 Nonrheumatic mitral (valve) insufficiency: Secondary | ICD-10-CM | POA: Diagnosis not present

## 2015-12-07 DIAGNOSIS — E7800 Pure hypercholesterolemia, unspecified: Secondary | ICD-10-CM | POA: Diagnosis not present

## 2015-12-07 DIAGNOSIS — I5022 Chronic systolic (congestive) heart failure: Secondary | ICD-10-CM

## 2015-12-07 NOTE — Progress Notes (Signed)
Patient ID: Albert [name], male   DOB: 10/14/1961, 55 y.o.   MRN: [id number]     Clinical Summary Albert [name] is a 55 y.o.male seen today for follow up of the following medical problems.   1. Chronic combined systolic/diastolic heart failure - ICM, LVEF 40-45% by echo 12/2013, grade II diastolic dysfunction - denies any SOE or DOE. Chronic LE edema that is controlled with lasix.   2. Mitral regurgitation - moderate by TEE 06/2013 and by TTE 12/2013 - denies any SOB,DOE, orthopnea, PND.   3. CAD - hx of demand ischemia 06/2013 in setting of sepsis with necrotic foot wound. - cath at that time 06/2013 LM patent, LAD 40% prox, 70% mid follow by 60% lesion, distal 90%. Diag 99%. LCX 80-90% prox, OM1 80%, OM2 99%, OM3 99 % (all OMs are small), RCA small non-dom mid 99%. LVEF 40% by LVgram.  - CAD was managed medically after discussions with CT surgery at that time, with plans to reevaluate once recurring infection issues resolved.   - denies any recent chest pain since last visit  4. PAD - followed by vascular and [facility] - previous right BKA, previous multiple toe amputations. 08/30/14 treated for osteo and ulcer of left great toe with amputation. Jan 2016 had left midfoot amputation.  - reports foot is healing well.   - continues to deal with left foot sore. Followed by ortho Dr Sharol Given.    5. Hyperlipidemia - compliant with statin - Lipid panel Jan 2017: TC 97 TG 114 HDL 25 LDL 49  6. HTN - does not check at home.  - compliant with meds  Past Medical History  Diagnosis Date  . Hypertension   . Hypercholesteremia   . CVA (cerebral infarction) 2011    R hand deficit  . Chronic sinusitis   . Allergic rhinitis   . Chronic cough   . Complication of anesthesia     couldn't swallow and talk  . Splenic infarct     setting of lupus anticoagulant  . Myocardial infarction ([place])   . Stroke [facility])     right arm weakness  . Pneumonia     hx of walking  . Diabetes mellitus  without complication ([place]) [id number]    last HbA1c ~9 .... fasting 160s  . GERD (gastroesophageal reflux disease)   . Anemia     takes supplement  . Osteomyelitis ([place])     first and third metatarsal     Allergies  Allergen Reactions  . Amoxicillin Nausea And Vomiting     Current Outpatient Prescriptions  Medication Sig Dispense Refill  . aspirin EC 81 MG tablet Take 1 tablet (81 mg total) by mouth daily. 30 tablet 1  . atorvastatin (LIPITOR) 80 MG tablet Take 1 tablet (80 mg total) by mouth daily. 30 tablet 6  . carvedilol (COREG) 25 MG tablet Take 25 mg by mouth 2 (two) times daily with a meal.     . furosemide (LASIX) 80 MG tablet Take 80 mg by mouth daily.    Marland Kitchen HUMALOG KWIKPEN 100 UNIT/ML KiwkPen Inject 18 Units into the skin 3 (three) times daily before meals.    . hydrALAZINE (APRESOLINE) 10 MG tablet Take 1 tablet (10 mg total) by mouth 3 (three) times daily. 120 tablet 1  . LEVEMIR FLEXTOUCH 100 UNIT/ML Pen Inject 40 Units into the skin daily at 10 pm.   5  . Loratadine-Pseudoephedrine (CLARITIN-D 12 HOUR PO) Take 1 tablet by mouth daily as needed (  allergies).     . losartan (COZAAR) 50 MG tablet Take 1 tablet (50 mg total) by mouth daily. 90 tablet 3  . nitroGLYCERIN (NITRODUR - DOSED IN MG/24 HR) 0.2 mg/hr patch Place 0.2 mg onto the skin 2 days.     . pantoprazole (PROTONIX) 40 MG tablet Take 1 tablet (40 mg total) by mouth daily. 30 tablet 1  . polyethylene glycol (MIRALAX / GLYCOLAX) packet Take 17 g by mouth daily as needed for mild constipation.     . potassium chloride (K-DUR,KLOR-CON) 10 MEQ tablet Take 1 tablet (10 mEq total) by mouth daily. 30 tablet 6  . promethazine (PHENERGAN) 25 MG tablet Take 1 tab every 6-8 hours for nausea. (Patient taking differently: Take 25 mg by mouth every 6 (six) hours as needed for nausea. Take 1 tab every 6-8 hours for nausea.) 40 tablet 1  . senna-docusate (SENOKOT-S) 8.6-50 MG per tablet Take 1 tablet by mouth daily as needed for mild  constipation.      No current facility-administered medications for this visit.     Past Surgical History  Procedure Laterality Date  . 4th toe amputation Left Jan. 2014    4th toe in [place]  . Tonsillectomy and adenoidectomy    . Penile prosthesis implant    . I&d extremity Left 12/08/2012    Procedure: IRRIGATION AND DEBRIDEMENT EXTREMITY;  Surgeon: Mcarthur Rossetti, MD;  Location: [place];  Service: Orthopedics;  Laterality: Left;  . Amputation Left 12/08/2012    Procedure: AMPUTATION DIGIT;  Surgeon: Mcarthur Rossetti, MD;  Location: [place];  Service: Orthopedics;  Laterality: Left;  3rd toe amputation possible 5th toe amputation  . I&d extremity Left 12/11/2012    Procedure: REPEAT I&D LEFT FOOT;  Surgeon: Mcarthur Rossetti, MD;  Location: [place];  Service: Orthopedics;  Laterality: Left;  . Amputation Left 12/11/2012    Procedure: Amputation of 2nd Toe;  Surgeon: Mcarthur Rossetti, MD;  Location: [place];  Service: Orthopedics;  Laterality: Left;  . Bypass graft popliteal to tibial Left 12/13/2012    Procedure: BYPASS GRAFT POPLITEAL TO TIBIAL;  Surgeon: Serafina Mitchell, MD;  Location: [facility];  Service: Vascular;  Laterality: Left;  Left Popliteal to Posterior Tibial Bypass Graft with reversed saphenous vein graft  . Incision and drainage Right 06/25/2013    Procedure: INCISION AND DRAINAGE RIGHT FOOT;  Surgeon: Mcarthur Rossetti, MD;  Location: [facility];  Service: Orthopedics;  Laterality: Right;  . Amputation Right 06/29/2013    Procedure: AMPUTATION BELOW KNEE;  Surgeon: Newt Minion, MD;  Location: [place];  Service: Orthopedics;  Laterality: Right;  . Tee without cardioversion [name] 07/03/2013    Procedure: TRANSESOPHAGEAL ECHOCARDIOGRAM (TEE);  Surgeon: Larey Dresser, MD;  Location: [place];  Service: Cardiovascular;  Laterality: [name];  . Stump revision Right 08/16/2013    Procedure: STUMP REVISION- right Below Knee Amputation;  Surgeon: Newt Minion, MD;  Location:  [place];  Service: Orthopedics;  Laterality: Right;  . Cardiac catheterization    . Amputation Left 08/29/2014    Procedure: Left Great Toe Amputation at MTP;  Surgeon: Newt Minion, MD;  Location: [place];  Service: Orthopedics;  Laterality: Left;  . Left heart catheterization with coronary angiogram [name] 06/28/2013    Procedure: LEFT HEART CATHETERIZATION WITH CORONARY ANGIOGRAM;  Surgeon: Burnell Blanks, MD;  Location: [facility];  Service: Cardiovascular;  Laterality: [name];  . Amputation Left 10/31/2014    Procedure: Midfoot Amputation;  Surgeon: Newt Minion, MD;  Location: [place];  Service: Orthopedics;  Laterality: Left;     Allergies  Allergen Reactions  . Amoxicillin Nausea And Vomiting      Family History  Problem Relation Age of Onset  . Diabetes    . Pancreatic cancer Mother   . Diabetes Mother   . Hyperlipidemia Mother   . Breast cancer Sister   . Depression Maternal Grandmother   . Heart disease Maternal Grandmother   . Depression Maternal Grandfather   . Heart disease Maternal Grandfather   . Depression Paternal Grandmother   . Heart disease Paternal Grandmother   . Depression Paternal Grandfather   . Heart disease Paternal Grandfather      Social History Albert [name] reports that he has never smoked. He has never used smokeless tobacco. Albert [name] reports that he does not drink alcohol.   Review of Systems CONSTITUTIONAL: No weight loss, fever, chills, weakness or fatigue.  HEENT: Eyes: No visual loss, blurred vision, double vision or yellow sclerae.No hearing loss, sneezing, congestion, runny nose or sore throat.  SKIN: No rash or itching.  CARDIOVASCULAR: per HPI RESPIRATORY: No shortness of breath, cough or sputum.  GASTROINTESTINAL: No anorexia, nausea, vomiting or diarrhea. No abdominal pain or blood.  GENITOURINARY: No burning on urination, no polyuria NEUROLOGICAL: No headache, dizziness, syncope, paralysis, ataxia, numbness or tingling in the  extremities. No change in bowel or bladder control.  MUSCULOSKELETAL: No muscle, back pain, joint pain or stiffness.  LYMPHATICS: No enlarged nodes. No history of splenectomy.  PSYCHIATRIC: No history of depression or anxiety.  ENDOCRINOLOGIC: No reports of sweating, cold or heat intolerance. No polyuria or polydipsia.  Marland Kitchen   Physical Examination Filed Vitals:   12/07/15 1547  BP: 101/71  Pulse: 75   Filed Vitals:   12/07/15 1547  Height: 5\' 11"  (1.803 m)  Weight: 257 lb (116.574 kg)    Gen: resting comfortably, no acute distress HEENT: no scleral icterus, pupils equal round and reactive, no palptable cervical adenopathy,  CV: RRR, no m/r/g, no jvd Resp: Clear to auscultation bilaterally GI: abdomen is soft, non-tender, non-distended, normal bowel sounds, no hepatosplenomegaly MSK: extremities are warm, no edema.  Skin: warm, no rash Neuro:  no focal deficits Psych: appropriate affect   Diagnostic Studies 06/2013 Cath Hemodynamic Findings: Central aortic pressure: 90/61 Left ventricular pressure: 90/19/29  Angiographic Findings:  Left main: Short segment without obstruction.   Left Anterior Descending Artery: Moderate caliber diffusely diseased vessel that courses to the apex. The proximal vessel has diffuse 40% stenosis. The mid vessel has a 70% stenosis followed by a 60% stenosis. The distal vessel has diffuse 90% stenosis. The diagonal is a moderate caliber vessel with ostial 99% stenosis with 99% disease extending into the mid vessel.   Circumflex Artery: Large caliber vessel with proximal 80-90% stenosis. There are three small to moderate caliber obtuse marginal branches. The first OM has mid 80% stenosis. The second OM has a proximal 99% stenosis. The third OM has diffuse 99% stenosis. The left sided posterolateral Tung Pustejovsky has 99% stenosis.   Right Coronary Artery: Small non-dominant vessel with mid 99% stenosis.   Left Ventricular Angiogram:  LVEF=40%  Impression: 1. Triple vessel CAD 2. NSTEMI secondary to demand ischemia from sepsis with severe underlying CAD 3. Mild LV systolic dysfunction  Recommendations: Complex situation. The only option for coronary revascularization is CABG. He is not a favorable candidate for CABG at this time with necrotic right foot, sepsis. Even though not  ideal, would favor medical management of CAD while undergoing treatment of his necrotic foot. Would proceed with right BKA this weekend. I have discussed the case with CT surgery who agrees that CABG is not a good option at this time. Will ask CT surgery to see after he recovers from his BKA. Continue ASA, beta blocker, statin.    Complications: None. The patient tolerated the procedure well.     11/2012 Carotid US Summary:  - No significant extracranial carotid artery stenosis demonstrated. Vertebrals are patent with antegrade flow. - ICA/CCA ratio= right = 0.94. left = 0.73. Other specific details can be found in the table(s) above.  Prepared and Electronically Authenticated by  03/2015 Carotid US IMPRESSION: Mild atherosclerotic disease in the carotid arteries, right side greater the left. The peak systolic velocity in the proximal right internal carotid artery is slightly elevated. Estimated degree of stenosis in the right internal carotid artery is close to 50%.  Estimated degree of stenosis in the left internal carotid artery is less than 50%.  Patent vertebral arteries.   12/07/15 Clinic EKG (performed and reviewed in clinic): NSR  Assessment and Plan  1. Chronic systolic heart failure - ICM, LVEF 40-45% by echo 12/2013 with grade II diastolic dysfunction. NYHA II. Currently appears euvolemic -continue current meds, no current symptoms  2. CAD - triple vessel CAD managed medically. Seen by CT surgery, due to ongoing recurrent LE leg infections and multiple surgeries, as well as overall high risk recs were  for medical therapy, reconsideration if other comorbidities significanlty improved - continue to monitor. Will request records from ortho  3. Hyperlipidemia -at goal, continue current meds  4. PAD - per vascular and ortho  5. Mitral regurgitation - moderate by recent imaging, no clinical symptoms at this time - we will continue to monitor  6. HTN - at goal, continue current meds    F/u 6 months    Arnoldo Lenis, M.D.

## 2015-12-07 NOTE — Patient Instructions (Signed)
Continue all current medications. Your physician wants you to follow up in: 6 months.  You will receive a reminder letter in the mail one-two months in advance.  If you don't receive a letter, please call our office to schedule the follow up appointment   

## 2015-12-28 DIAGNOSIS — Z89511 Acquired absence of right leg below knee: Secondary | ICD-10-CM | POA: Diagnosis not present

## 2015-12-28 DIAGNOSIS — Z89432 Acquired absence of left foot: Secondary | ICD-10-CM | POA: Diagnosis not present

## 2015-12-28 DIAGNOSIS — L97421 Non-pressure chronic ulcer of left heel and midfoot limited to breakdown of skin: Secondary | ICD-10-CM | POA: Diagnosis not present

## 2016-01-18 DIAGNOSIS — Z89432 Acquired absence of left foot: Secondary | ICD-10-CM | POA: Diagnosis not present

## 2016-01-18 DIAGNOSIS — Z89511 Acquired absence of right leg below knee: Secondary | ICD-10-CM | POA: Diagnosis not present

## 2016-01-18 DIAGNOSIS — L02416 Cutaneous abscess of left lower limb: Secondary | ICD-10-CM | POA: Diagnosis not present

## 2016-01-18 DIAGNOSIS — L97421 Non-pressure chronic ulcer of left heel and midfoot limited to breakdown of skin: Secondary | ICD-10-CM | POA: Diagnosis not present

## 2016-02-08 ENCOUNTER — Observation Stay (HOSPITAL_COMMUNITY)
Admission: EM | Admit: 2016-02-08 | Discharge: 2016-02-09 | Disposition: A | Discharge status: Home / Self Care | Payer: Medicare Other | Attending: Internal Medicine | Admitting: Internal Medicine

## 2016-02-08 ENCOUNTER — Emergency Department (HOSPITAL_COMMUNITY): Payer: Medicare Other

## 2016-02-08 ENCOUNTER — Encounter (HOSPITAL_COMMUNITY): Payer: Self-pay

## 2016-02-08 DIAGNOSIS — M79672 Pain in left foot: Secondary | ICD-10-CM | POA: Diagnosis not present

## 2016-02-08 DIAGNOSIS — IMO0002 Reserved for concepts with insufficient information to code with codable children: Secondary | ICD-10-CM | POA: Diagnosis present

## 2016-02-08 DIAGNOSIS — Z7982 Long term (current) use of aspirin: Secondary | ICD-10-CM | POA: Diagnosis not present

## 2016-02-08 DIAGNOSIS — N179 Acute kidney failure, unspecified: Secondary | ICD-10-CM | POA: Diagnosis not present

## 2016-02-08 DIAGNOSIS — L03116 Cellulitis of left lower limb: Principal | ICD-10-CM | POA: Insufficient documentation

## 2016-02-08 DIAGNOSIS — I10 Essential (primary) hypertension: Secondary | ICD-10-CM | POA: Diagnosis present

## 2016-02-08 DIAGNOSIS — Z79899 Other long term (current) drug therapy: Secondary | ICD-10-CM | POA: Insufficient documentation

## 2016-02-08 DIAGNOSIS — L02416 Cutaneous abscess of left lower limb: Secondary | ICD-10-CM | POA: Diagnosis not present

## 2016-02-08 DIAGNOSIS — E1369 Other specified diabetes mellitus with other specified complication: Secondary | ICD-10-CM | POA: Diagnosis present

## 2016-02-08 DIAGNOSIS — I5042 Chronic combined systolic (congestive) and diastolic (congestive) heart failure: Secondary | ICD-10-CM

## 2016-02-08 DIAGNOSIS — Z8673 Personal history of transient ischemic attack (TIA), and cerebral infarction without residual deficits: Secondary | ICD-10-CM | POA: Insufficient documentation

## 2016-02-08 DIAGNOSIS — N289 Disorder of kidney and ureter, unspecified: Secondary | ICD-10-CM | POA: Diagnosis not present

## 2016-02-08 DIAGNOSIS — E1165 Type 2 diabetes mellitus with hyperglycemia: Secondary | ICD-10-CM | POA: Diagnosis present

## 2016-02-08 DIAGNOSIS — I252 Old myocardial infarction: Secondary | ICD-10-CM | POA: Insufficient documentation

## 2016-02-08 DIAGNOSIS — E119 Type 2 diabetes mellitus without complications: Secondary | ICD-10-CM | POA: Insufficient documentation

## 2016-02-08 DIAGNOSIS — L02419 Cutaneous abscess of limb, unspecified: Secondary | ICD-10-CM

## 2016-02-08 DIAGNOSIS — M79662 Pain in left lower leg: Secondary | ICD-10-CM | POA: Diagnosis not present

## 2016-02-08 DIAGNOSIS — M7989 Other specified soft tissue disorders: Secondary | ICD-10-CM | POA: Diagnosis present

## 2016-02-08 DIAGNOSIS — L0390 Cellulitis, unspecified: Secondary | ICD-10-CM | POA: Diagnosis present

## 2016-02-08 DIAGNOSIS — L03119 Cellulitis of unspecified part of limb: Secondary | ICD-10-CM

## 2016-02-08 LAB — CBC WITH DIFFERENTIAL/PLATELET
BASOS PCT: 0 %
Basophils Absolute: 0 10*3/uL (ref 0.0–0.1)
Eosinophils Absolute: 0 10*3/uL (ref 0.0–0.7)
Eosinophils Relative: 0 %
HCT: 35.3 % — ABNORMAL LOW (ref 39.0–52.0)
Hemoglobin: 11.9 g/dL — ABNORMAL LOW (ref 13.0–17.0)
LYMPHS ABS: 1.7 10*3/uL (ref 0.7–4.0)
Lymphocytes Relative: 11 %
MCH: 27.2 pg (ref 26.0–34.0)
MCHC: 33.7 g/dL (ref 30.0–36.0)
MCV: 80.6 fL (ref 78.0–100.0)
Monocytes Absolute: 1 10*3/uL (ref 0.1–1.0)
Monocytes Relative: 6 %
NEUTROS PCT: 83 %
Neutro Abs: 12.5 10*3/uL — ABNORMAL HIGH (ref 1.7–7.7)
PLATELETS: 196 10*3/uL (ref 150–400)
RBC: 4.38 MIL/uL (ref 4.22–5.81)
RDW: 13.6 % (ref 11.5–15.5)
WBC: 15.2 10*3/uL — AB (ref 4.0–10.5)

## 2016-02-08 LAB — CBC
HCT: 34.2 % — ABNORMAL LOW (ref 39.0–52.0)
Hemoglobin: 11.3 g/dL — ABNORMAL LOW (ref 13.0–17.0)
MCH: 27 pg (ref 26.0–34.0)
MCHC: 33 g/dL (ref 30.0–36.0)
MCV: 81.8 fL (ref 78.0–100.0)
Platelets: 196 10*3/uL (ref 150–400)
RBC: 4.18 MIL/uL — ABNORMAL LOW (ref 4.22–5.81)
RDW: 13.8 % (ref 11.5–15.5)
WBC: 10.8 10*3/uL — ABNORMAL HIGH (ref 4.0–10.5)

## 2016-02-08 LAB — CREATININE, SERUM
Creatinine, Ser: 1.62 mg/dL — ABNORMAL HIGH (ref 0.61–1.24)
GFR calc Af Amer: 54 mL/min — ABNORMAL LOW (ref >60)
GFR calc non Af Amer: 47 mL/min — ABNORMAL LOW (ref >60)

## 2016-02-08 LAB — GLUCOSE, CAPILLARY: Glucose-Capillary: 195 mg/dL — ABNORMAL HIGH (ref 65–99)

## 2016-02-08 LAB — BASIC METABOLIC PANEL
ANION GAP: 10 (ref 5–15)
BUN: 28 mg/dL — ABNORMAL HIGH (ref 6–20)
CHLORIDE: 102 mmol/L (ref 101–111)
CO2: 24 mmol/L (ref 22–32)
CREATININE: 1.64 mg/dL — AB (ref 0.61–1.24)
Calcium: 8.7 mg/dL — ABNORMAL LOW (ref 8.9–10.3)
GFR calc non Af Amer: 46 mL/min — ABNORMAL LOW (ref >60)
GFR, EST AFRICAN AMERICAN: 54 mL/min — AB (ref >60)
Glucose, Bld: 239 mg/dL — ABNORMAL HIGH (ref 65–99)
Potassium: 4.1 mmol/L (ref 3.5–5.1)
Sodium: 136 mmol/L (ref 135–145)

## 2016-02-08 MED ORDER — POLYETHYLENE GLYCOL 3350 17 G PO PACK: 17.0000 g | PACK | Freq: Every day | ORAL | Status: DC | PRN

## 2016-02-08 MED ORDER — ATORVASTATIN CALCIUM 40 MG PO TABS
80.0000 mg | ORAL_TABLET | Freq: Every day | ORAL | Status: DC
  Administered 2016-02-08 – 2016-02-09 (×2): 80 mg via ORAL
  Filled 2016-02-08 (×2): qty 2

## 2016-02-08 MED ORDER — NITROGLYCERIN 0.2 MG/HR TD PT24
0.2000 mg | MEDICATED_PATCH | TRANSDERMAL | Status: DC
  Administered 2016-02-09: 0.2 mg via TRANSDERMAL
  Filled 2016-02-08: qty 1

## 2016-02-08 MED ORDER — INSULIN ASPART 100 UNIT/ML ~~LOC~~ SOLN: 0.0000 [IU] | Freq: Every day | SUBCUTANEOUS | Status: DC

## 2016-02-08 MED ORDER — CLINDAMYCIN PHOSPHATE 600 MG/50ML IV SOLN
600.0000 mg | Freq: Three times a day (TID) | INTRAVENOUS | Status: DC
  Administered 2016-02-08 – 2016-02-09 (×3): 600 mg via INTRAVENOUS
  Filled 2016-02-08 (×7): qty 50

## 2016-02-08 MED ORDER — ASPIRIN EC 81 MG PO TBEC
81.0000 mg | DELAYED_RELEASE_TABLET | Freq: Every day | ORAL | Status: DC
  Administered 2016-02-08 – 2016-02-09 (×2): 81 mg via ORAL
  Filled 2016-02-08 (×2): qty 1

## 2016-02-08 MED ORDER — HYDRALAZINE HCL 10 MG PO TABS
10.0000 mg | ORAL_TABLET | Freq: Three times a day (TID) | ORAL | Status: DC
  Administered 2016-02-08 – 2016-02-09 (×3): 10 mg via ORAL
  Filled 2016-02-08 (×3): qty 1

## 2016-02-08 MED ORDER — FUROSEMIDE 80 MG PO TABS
80.0000 mg | ORAL_TABLET | Freq: Every day | ORAL | Status: DC
  Administered 2016-02-08 – 2016-02-09 (×2): 80 mg via ORAL
  Filled 2016-02-08 (×2): qty 1

## 2016-02-08 MED ORDER — SENNOSIDES-DOCUSATE SODIUM 8.6-50 MG PO TABS: 1.0000 | ORAL_TABLET | Freq: Every evening | ORAL | Status: DC | PRN

## 2016-02-08 MED ORDER — POTASSIUM CHLORIDE CRYS ER 10 MEQ PO TBCR
10.0000 meq | EXTENDED_RELEASE_TABLET | Freq: Every day | ORAL | Status: DC
  Administered 2016-02-08 – 2016-02-09 (×2): 10 meq via ORAL
  Filled 2016-02-08 (×2): qty 1

## 2016-02-08 MED ORDER — ONDANSETRON HCL 4 MG/2ML IJ SOLN: 4.0000 mg | Freq: Four times a day (QID) | INTRAMUSCULAR | Status: DC | PRN

## 2016-02-08 MED ORDER — ONDANSETRON HCL 4 MG PO TABS: 4.0000 mg | ORAL_TABLET | Freq: Four times a day (QID) | ORAL | Status: DC | PRN

## 2016-02-08 MED ORDER — OXYCODONE HCL 5 MG PO TABS: 5.0000 mg | ORAL_TABLET | ORAL | Status: DC | PRN

## 2016-02-08 MED ORDER — INSULIN ASPART 100 UNIT/ML ~~LOC~~ SOLN
0.0000 [IU] | Freq: Three times a day (TID) | SUBCUTANEOUS | Status: DC
  Administered 2016-02-08: 8 [IU] via SUBCUTANEOUS
  Administered 2016-02-09: 3 [IU] via SUBCUTANEOUS
  Administered 2016-02-09: 5 [IU] via SUBCUTANEOUS

## 2016-02-08 MED ORDER — PANTOPRAZOLE SODIUM 40 MG PO TBEC
40.0000 mg | DELAYED_RELEASE_TABLET | Freq: Every day | ORAL | Status: DC
Start: 2016-02-08 — End: 2016-02-09
  Administered 2016-02-08 – 2016-02-09 (×2): 40 mg via ORAL
  Filled 2016-02-08 (×2): qty 1

## 2016-02-08 MED ORDER — VANCOMYCIN HCL IN DEXTROSE 1-5 GM/200ML-% IV SOLN
1000.0000 mg | Freq: Once | INTRAVENOUS | Status: AC
  Administered 2016-02-08: 1000 mg via INTRAVENOUS
  Filled 2016-02-08: qty 200

## 2016-02-08 MED ORDER — ENOXAPARIN SODIUM 40 MG/0.4ML ~~LOC~~ SOLN
40.0000 mg | SUBCUTANEOUS | Status: DC
  Administered 2016-02-08: 40 mg via SUBCUTANEOUS
  Filled 2016-02-08: qty 0.4

## 2016-02-08 MED ORDER — ACETAMINOPHEN 325 MG PO TABS: 650.0000 mg | ORAL_TABLET | Freq: Four times a day (QID) | ORAL | Status: DC | PRN

## 2016-02-08 MED ORDER — CARVEDILOL 12.5 MG PO TABS
25.0000 mg | ORAL_TABLET | Freq: Two times a day (BID) | ORAL | Status: DC
  Administered 2016-02-08 – 2016-02-09 (×2): 25 mg via ORAL
  Filled 2016-02-08: qty 2
  Filled 2016-02-08: qty 8

## 2016-02-08 MED ORDER — LOSARTAN POTASSIUM 50 MG PO TABS
50.0000 mg | ORAL_TABLET | Freq: Every day | ORAL | Status: DC
  Administered 2016-02-08 – 2016-02-09 (×2): 50 mg via ORAL
  Filled 2016-02-08 (×2): qty 1

## 2016-02-08 MED ORDER — INSULIN DETEMIR 100 UNIT/ML ~~LOC~~ SOLN
40.0000 [IU] | Freq: Every day | SUBCUTANEOUS | Status: DC
  Administered 2016-02-08: 40 [IU] via SUBCUTANEOUS
  Filled 2016-02-08 (×2): qty 0.4

## 2016-02-08 MED ORDER — SODIUM CHLORIDE 0.9 % IV SOLN
INTRAVENOUS | Status: AC
  Administered 2016-02-08: 17:00:00 via INTRAVENOUS

## 2016-02-08 MED ORDER — SENNOSIDES-DOCUSATE SODIUM 8.6-50 MG PO TABS: 1.0000 | ORAL_TABLET | Freq: Every day | ORAL | Status: DC | PRN

## 2016-02-08 MED ORDER — ACETAMINOPHEN 650 MG RE SUPP: 650.0000 mg | Freq: Four times a day (QID) | RECTAL | Status: DC | PRN

## 2016-02-08 MED ORDER — LORATADINE 10 MG PO TABS
10.0000 mg | ORAL_TABLET | Freq: Every day | ORAL | Status: DC
  Administered 2016-02-08 – 2016-02-09 (×2): 10 mg via ORAL
  Filled 2016-02-08 (×2): qty 1

## 2016-02-08 MED ORDER — SODIUM CHLORIDE 0.9 % IV BOLUS (SEPSIS)
1000.0000 mL | Freq: Once | INTRAVENOUS | Status: AC
  Administered 2016-02-08: 1000 mL via INTRAVENOUS

## 2016-02-08 MED ORDER — INSULIN ASPART 100 UNIT/ML ~~LOC~~ SOLN
4.0000 [IU] | Freq: Three times a day (TID) | SUBCUTANEOUS | Status: DC
Start: 2016-02-08 — End: 2016-02-09
  Administered 2016-02-08 – 2016-02-09 (×3): 4 [IU] via SUBCUTANEOUS

## 2016-02-08 NOTE — ED Notes (Signed)
Pt complain of swelling and redness to left lower leg that started yesterday. States he has had cellulitis in the same leg before

## 2016-02-08 NOTE — H&P (Signed)
History and Physical    Albert [name] [id number] DOB: [birth year] DOA: 02/08/2016  Referring MD/NP/PA: Evalee Jefferson, PA PCP: Wende Neighbors, MD  Patient coming from: Home  Chief Complaint: Left leg redness  HPI: Albert [name] is a 55 y.o. male with multiple medical comorbidities including hypertension, hyperlipidemia, insulin-dependent diabetes, peripheral vascular disease status post right BKA and left transmetatarsal amputations who presents to the hospital today with a 24-hour history of left lower extremity redness and swelling. He has also had some subjective fever and chills. He presents to the hospital today where he is indeed found to have what appears to be a left lower extremity cellulitis. No signs of osteomyelitis on plain film. He also has acute renal failure with a creatinine of 1.6. Last known creatinine was 1.1 in July 2016. We have been asked to admit him for further evaluation and management.   Past Medical History  Diagnosis Date  . Hypertension   . Hypercholesteremia   . CVA (cerebral infarction) 2011    R hand deficit  . Chronic sinusitis   . Allergic rhinitis   . Chronic cough   . Complication of anesthesia     couldn't swallow and talk  . Splenic infarct     setting of lupus anticoagulant  . Myocardial infarction ([place])   . Stroke [facility])     right arm weakness  . Pneumonia     hx of walking  . Diabetes mellitus without complication ([place]) [id number]    last HbA1c ~9 .... fasting 160s  . GERD (gastroesophageal reflux disease)   . Anemia     takes supplement  . Osteomyelitis ([place])     first and third metatarsal    Past Surgical History  Procedure Laterality Date  . 4th toe amputation Left Jan. 2014    4th toe in [place]  . Tonsillectomy and adenoidectomy    . Penile prosthesis implant    . I&d extremity Left 12/08/2012    Procedure: IRRIGATION AND DEBRIDEMENT EXTREMITY;  Surgeon: Mcarthur Rossetti, MD;  Location: [place];  Service: Orthopedics;  Laterality:  Left;  . Amputation Left 12/08/2012    Procedure: AMPUTATION DIGIT;  Surgeon: Mcarthur Rossetti, MD;  Location: [place];  Service: Orthopedics;  Laterality: Left;  3rd toe amputation possible 5th toe amputation  . I&d extremity Left 12/11/2012    Procedure: REPEAT I&D LEFT FOOT;  Surgeon: Mcarthur Rossetti, MD;  Location: [place];  Service: Orthopedics;  Laterality: Left;  . Amputation Left 12/11/2012    Procedure: Amputation of 2nd Toe;  Surgeon: Mcarthur Rossetti, MD;  Location: [place];  Service: Orthopedics;  Laterality: Left;  . Bypass graft popliteal to tibial Left 12/13/2012    Procedure: BYPASS GRAFT POPLITEAL TO TIBIAL;  Surgeon: Serafina Mitchell, MD;  [facility];  Service: Vascular;  Laterality: Left;  Left Popliteal to Posterior Tibial Bypass Graft with reversed saphenous vein graft  . Incision and drainage Right 06/25/2013    Procedure: INCISION AND DRAINAGE RIGHT FOOT;  Surgeon: Mcarthur Rossetti, MD;  Location: [facility];  Service: Orthopedics;  Laterality: Right;  . Amputation Right 06/29/2013    Procedure: AMPUTATION BELOW KNEE;  Surgeon: Newt Minion, MD;  Location: [place];  Service: Orthopedics;  Laterality: Right;  . Tee without cardioversion [name] 07/03/2013    Procedure: TRANSESOPHAGEAL ECHOCARDIOGRAM (TEE);  Surgeon: Larey Dresser, MD;  Location: [facility] ENDOS[facility];  Service: Cardiovascular;  Laterality: [name];  . Stump revision Right 08/16/2013  Procedure: STUMP REVISION- right Below Knee Amputation;  Surgeon: Newt Minion, MD;  Location: [place];  Service: Orthopedics;  Laterality: Right;  . Cardiac catheterization    . Amputation Left 08/29/2014    Procedure: Left Great Toe Amputation at MTP;  Surgeon: Newt Minion, MD;  Location: [place];  Service: Orthopedics;  Laterality: Left;  . Left heart catheterization with coronary angiogram [name] 06/28/2013    Procedure: LEFT HEART CATHETERIZATION WITH CORONARY ANGIOGRAM;  Surgeon: Burnell Blanks, MD;  Location: [facility] CATH  LAB;  Service: Cardiovascular;  Laterality: [name];  . Amputation Left 10/31/2014    Procedure: Midfoot Amputation;  Surgeon: Newt Minion, MD;  Location: [place];  Service: Orthopedics;  Laterality: Left;     reports that he has never smoked. He has never used smokeless tobacco. He reports that he does not drink alcohol or use illicit drugs.  Allergies  Allergen Reactions  . Amoxicillin Nausea And Vomiting    Family History  Problem Relation Age of Onset  . Diabetes    . Pancreatic cancer Mother   . Diabetes Mother   . Hyperlipidemia Mother   . Breast cancer Sister   . Depression Maternal Grandmother   . Heart disease Maternal Grandmother   . Depression Maternal Grandfather   . Heart disease Maternal Grandfather   . Depression Paternal Grandmother   . Heart disease Paternal Grandmother   . Depression Paternal Grandfather   . Heart disease Paternal Grandfather     Prior to Admission medications   Medication Sig Start Date End Date Taking? Authorizing Provider  aspirin EC 81 MG tablet Take 1 tablet (81 mg total) by mouth daily. 01/09/14  Yes Kathie Dike, MD  atorvastatin (LIPITOR) 80 MG tablet Take 1 tablet (80 mg total) by mouth daily. 09/29/14  Yes Arnoldo Lenis, MD  carvedilol (COREG) 25 MG tablet Take 25 mg by mouth 2 (two) times daily with a meal.    Yes Historical Provider, MD  furosemide (LASIX) 80 MG tablet Take 80 mg by mouth daily. 08/08/13  Yes Daniel J Angiulli, PA-C  HUMALOG KWIKPEN 100 UNIT/ML KiwkPen Inject 18 Units into the skin 3 (three) times daily before meals. 07/06/15  Yes Historical Provider, MD  hydrALAZINE (APRESOLINE) 10 MG tablet Take 1 tablet (10 mg total) by mouth 3 (three) times daily. 10/01/13  Yes Burtis Junes, NP  LEVEMIR FLEXTOUCH 100 UNIT/ML Pen Inject 40 Units into the skin daily at 10 pm.  04/23/15  Yes Historical Provider, MD  Loratadine-Pseudoephedrine (CLARITIN-D 12 HOUR PO) Take 1 tablet by mouth daily as needed (allergies).    Yes  Historical Provider, MD  losartan (COZAAR) 50 MG tablet Take 1 tablet (50 mg total) by mouth daily. 08/31/15  Yes Arnoldo Lenis, MD  nitroGLYCERIN (NITRODUR - DOSED IN MG/24 HR) 0.2 mg/hr patch Place 0.2 mg onto the skin 2 days.  12/23/14  Yes Historical Provider, MD  pantoprazole (PROTONIX) 40 MG tablet Take 1 tablet (40 mg total) by mouth daily. 08/08/13  Yes Daniel J Angiulli, PA-C  polyethylene glycol (MIRALAX / GLYCOLAX) packet Take 17 g by mouth daily as needed for mild constipation.    Yes Historical Provider, MD  potassium chloride (K-DUR,KLOR-CON) 10 MEQ tablet Take 1 tablet (10 mEq total) by mouth daily. 04/08/15  Yes Arnoldo Lenis, MD  promethazine (PHENERGAN) 25 MG tablet Take 1 tab every 6-8 hours for nausea. Patient taking differently: Take 25 mg by mouth every 6 (six) hours as needed  for nausea. Take 1 tab every 6-8 hours for nausea. 10/18/13  Yes Amy S Esterwood, PA-C  senna-docusate (SENOKOT-S) 8.6-50 MG per tablet Take 1 tablet by mouth daily as needed for mild constipation.  08/08/13  Yes Lavon Paganini Angiulli, PA-C    Review of Systems:  Constitutional: Denies diaphoresis, appetite change and fatigue.  HEENT: Denies photophobia, eye pain, redness, hearing loss, ear pain, congestion, sore throat, rhinorrhea, sneezing, mouth sores, trouble swallowing, neck pain, neck stiffness and tinnitus.   Respiratory: Denies SOB, DOE, cough, chest tightness,  and wheezing.   Cardiovascular: Denies chest pain, palpitations and leg swelling.  Gastrointestinal: Denies nausea, vomiting, abdominal pain, diarrhea, constipation, blood in stool and abdominal distention.  Genitourinary: Denies dysuria, urgency, frequency, hematuria, flank pain and difficulty urinating.  Endocrine: Denies: hot or cold intolerance, sweats, changes in hair or nails, polyuria, polydipsia. Musculoskeletal: Denies myalgias, back pain, joint swelling, arthralgias and gait problem.  Skin: Denies pallor, rash and wound.    Neurological: Denies dizziness, seizures, syncope, weakness, light-headedness, numbness and headaches.  Hematological: Denies adenopathy. Easy bruising, personal or family bleeding history  Psychiatric/Behavioral: Denies suicidal ideation, mood changes, confusion, nervousness, sleep disturbance and agitation    Physical Exam: Filed Vitals:   02/08/16 1230 02/08/16 1300 02/08/16 1313 02/08/16 1353  BP: 122/68 106/67 106/67 90/72  Pulse: 85 86 84 85  Temp:   98 F (36.7 C) 98.2 F (36.8 C)  TempSrc:   Oral Oral  Resp:   18 18  Height:    5\' 11"  (1.803 m)  Weight:    119.205 kg (262 lb 12.8 oz)  SpO2: 99% 97% 97% 97%      Constitutional: NAD, calm, comfortable Eyes: PERRL, lids and conjunctivae normal ENMT: Mucous membranes are moist. Posterior pharynx clear of any exudate or lesions.Normal dentition.  Neck: normal, supple, no masses, no thyromegaly Respiratory: clear to auscultation bilaterally, no wheezing, no crackles. Normal respiratory effort. No accessory muscle use.  Cardiovascular: Regular rate and rhythm, no murmurs / rubs / gallops. No extremity edema. 2+ pedal pulses. No carotid bruits.  Abdomen: no tenderness, no masses palpated. No hepatosplenomegaly. Bowel sounds positive.  Musculoskeletal: Right with below-knee amputation, left with transmetatarsal amputation; redness that begins at the ankle and extends all the way up to right below the knee. Skin: no rashes, lesions, ulcers. No induration Neurologic: CN 2-12 grossly intact. Sensation intact, DTR normal. Strength 5/5 in all 4.  Psychiatric: Normal judgment and insight. Alert and oriented x 3. Normal mood.    Labs on Admission: I have personally reviewed following labs and imaging studies  CBC:  Recent Labs Lab 02/08/16 0920  WBC 15.2*  NEUTROABS 12.5*  HGB 11.9*  HCT 35.3*  MCV 80.6  PLT 123456   Basic Metabolic Panel:  Recent Labs Lab 02/08/16 0920  NA 136  K 4.1  CL 102  CO2 24  GLUCOSE 239*   BUN 28*  CREATININE 1.64*  CALCIUM 8.7*   GFR: Estimated Creatinine Clearance: 68.4 mL/min (by C-G formula based on Cr of 1.64). Liver Function Tests: No results for input(s): AST, ALT, ALKPHOS, BILITOT, PROT, ALBUMIN in the last 168 hours. No results for input(s): LIPASE, AMYLASE in the last 168 hours. No results for input(s): AMMONIA in the last 168 hours. Coagulation Profile: No results for input(s): INR, PROTIME in the last 168 hours. Cardiac Enzymes: No results for input(s): CKTOTAL, CKMB, CKMBINDEX, TROPONINI in the last 168 hours. BNP (last 3 results) No results for input(s): PROBNP in the last  8760 hours. HbA1C: No results for input(s): HGBA1C in the last 72 hours. CBG: No results for input(s): GLUCAP in the last 168 hours. Lipid Profile: No results for input(s): CHOL, HDL, LDLCALC, TRIG, CHOLHDL, LDLDIRECT in the last 72 hours. Thyroid Function Tests: No results for input(s): TSH, T4TOTAL, FREET4, T3FREE, THYROIDAB in the last 72 hours. Anemia Panel: No results for input(s): VITAMINB12, FOLATE, FERRITIN, TIBC, IRON, RETICCTPCT in the last 72 hours. Urine analysis:    Component Value Date/Time   COLORURINE AMBER* 01/06/2014 1455   APPEARANCEUR CLEAR 01/06/2014 1455   LABSPEC >1.030* 01/06/2014 1455   PHURINE 5.5 01/06/2014 1455   GLUCOSEU NEGATIVE 01/06/2014 1455   HGBUR TRACE* 01/06/2014 1455   BILIRUBINUR NEGATIVE 01/06/2014 1455   KETONESUR NEGATIVE 01/06/2014 1455   PROTEINUR 100* 01/06/2014 1455   UROBILINOGEN 0.2 01/06/2014 1455   NITRITE NEGATIVE 01/06/2014 1455   LEUKOCYTESUR NEGATIVE 01/06/2014 1455   Sepsis Labs: @LABRCNTIP (procalcitonin:4,lacticidven:4) )No results found for this or any previous visit (from the past 240 hour(s)).   Radiological Exams on Admission: Dg Tibia/fibula Left  02/08/2016  CLINICAL DATA:  Redness and pain lower left leg without injury/ EXAM: LEFT TIBIA AND FIBULA - 2 VIEW COMPARISON:  None. FINDINGS: Calcification of the  posterior tibial artery. No fracture or dislocation. Surgical clips in the medial soft tissues of the lower extremity. IMPRESSION: No acute findings Electronically Signed   By: Skipper Cliche M.D.   On: 02/08/2016 12:00   Dg Foot Complete Left  02/08/2016  CLINICAL DATA:  Redness and pain lower left leg without injury/diabetic EXAM: LEFT FOOT - COMPLETE 3+ VIEW COMPARISON:  None. FINDINGS: Status post amputation of all of the toes beyond the bases of the metatarsals. No acute osseous or soft tissue abnormalities identified. Calcification distal anterior and posterior tibial arteries. Small heel spur. IMPRESSION: No acute findings Electronically Signed   By: Skipper Cliche M.D.   On: 02/08/2016 12:01     Assessment/Plan Principal Problem:   Cellulitis Active Problems:   Diabetes mellitus out of control ([place])   Essential hypertension, benign   ARF (acute renal failure) (HCC)   Chronic combined systolic and diastolic CHF (congestive heart failure) (H[facility])    Left lower extremity cellulitis -We'll start on clindamycin per order set. -Blood cultures have been requested.  Acute renal failure -Give gentle IV fluids, mindful of congestive heart failure, recheck renal function in a.m.  Insulin-dependent diabetes mellitus -Check hemoglobin A1c, continue Levemir, place on moderate sliding scale insulin while in the hospital.  Chronic combined CHF  -Appears compensated at present.  Hypertension -Well-controlled, continue home medications.   DVT prophylaxis: Lovenox  Code Status: Full code  Family Communication: Patient only  Disposition Plan: Anticipate discharge home in 24-48 hours  Consults called: None  Admission status: Observation    Time Spent: 75 minutes  Lelon Frohlich MD [facility] Pager [phone]  If 7PM-7AM, please contact night-coverage [email or web address] Password [email or web address]  02/08/2016, 4:21 PM

## 2016-02-08 NOTE — ED Provider Notes (Signed)
CSN: [id number]     Arrival date & time 02/08/16  0807 History   First MD Initiated Contact with Patient 02/08/16 [phone]     Chief Complaint  Patient presents with  . Leg Swelling     (Consider location/radiation/quality/duration/timing/severity/associated sxs/prior Treatment) The history is provided by the patient.   Albert [name] is a 55 y.o. male with a history of diabetes and prior osteomyelitis of left toes requiring amputation  Presenting with a 1 day history of suspected return of cellulitis in his left lower leg.  He endorses feeling yesterday, denied any specific symptoms including no fevers or localizing pain until he awoke this morning.  He noticed redness and tenderness on his entire left lower leg concerning for return of infection.  He reports his blood sugars have not been well controlled, he has not checked his CBG today but has been compliant with his home medications.    Past Medical History  Diagnosis Date  . Hypertension   . Hypercholesteremia   . CVA (cerebral infarction) 2011    R hand deficit  . Chronic sinusitis   . Allergic rhinitis   . Chronic cough   . Complication of anesthesia     couldn't swallow and talk  . Splenic infarct     setting of lupus anticoagulant  . Myocardial infarction ([place])   . Stroke [facility])     right arm weakness  . Pneumonia     hx of walking  . Diabetes mellitus without complication ([place]) [id number]    last HbA1c ~9 .... fasting 160s  . GERD (gastroesophageal reflux disease)   . Anemia     takes supplement  . Osteomyelitis ([place])     first and third metatarsal   Past Surgical History  Procedure Laterality Date  . 4th toe amputation Left Jan. 2014    4th toe in [place]  . Tonsillectomy and adenoidectomy    . Penile prosthesis implant    . I&d extremity Left 12/08/2012    Procedure: IRRIGATION AND DEBRIDEMENT EXTREMITY;  Surgeon: Mcarthur Rossetti, MD;  Location: [place];  Service: Orthopedics;  Laterality: Left;  . Amputation  Left 12/08/2012    Procedure: AMPUTATION DIGIT;  Surgeon: Mcarthur Rossetti, MD;  Location: [place];  Service: Orthopedics;  Laterality: Left;  3rd toe amputation possible 5th toe amputation  . I&d extremity Left 12/11/2012    Procedure: REPEAT I&D LEFT FOOT;  Surgeon: Mcarthur Rossetti, MD;  Location: [place];  Service: Orthopedics;  Laterality: Left;  . Amputation Left 12/11/2012    Procedure: Amputation of 2nd Toe;  Surgeon: Mcarthur Rossetti, MD;  Location: [place];  Service: Orthopedics;  Laterality: Left;  . Bypass graft popliteal to tibial Left 12/13/2012    Procedure: BYPASS GRAFT POPLITEAL TO TIBIAL;  Surgeon: Serafina Mitchell, MD;  [facility];  Service: Vascular;  Laterality: Left;  Left Popliteal to Posterior Tibial Bypass Graft with reversed saphenous vein graft  . Incision and drainage Right 06/25/2013    Procedure: INCISION AND DRAINAGE RIGHT FOOT;  Surgeon: Mcarthur Rossetti, MD;  [facility];  Service: Orthopedics;  Laterality: Right;  . Amputation Right 06/29/2013    Procedure: AMPUTATION BELOW KNEE;  Surgeon: Newt Minion, MD;  Location: [place];  Service: Orthopedics;  Laterality: Right;  . Tee without cardioversion [name] 07/03/2013    Procedure: TRANSESOPHAGEAL ECHOCARDIOGRAM (TEE);  Surgeon: Larey Dresser, MD;  Location: [place];  Service: Cardiovascular;  Laterality: [name];  . Stump  revision Right 08/16/2013    Procedure: STUMP REVISION- right Below Knee Amputation;  Surgeon: Newt Minion, MD;  Location: [place];  Service: Orthopedics;  Laterality: Right;  . Cardiac catheterization    . Amputation Left 08/29/2014    Procedure: Left Great Toe Amputation at MTP;  Surgeon: Newt Minion, MD;  Location: [place];  Service: Orthopedics;  Laterality: Left;  . Left heart catheterization with coronary angiogram [name] 06/28/2013    Procedure: LEFT HEART CATHETERIZATION WITH CORONARY ANGIOGRAM;  Surgeon: Burnell Blanks, MD;  Location: [facility] CATH LAB;  Service:  Cardiovascular;  Laterality: [name];  . Amputation Left 10/31/2014    Procedure: Midfoot Amputation;  Surgeon: Newt Minion, MD;  Location: [place];  Service: Orthopedics;  Laterality: Left;   Family History  Problem Relation Age of Onset  . Diabetes    . Pancreatic cancer Mother   . Diabetes Mother   . Hyperlipidemia Mother   . Breast cancer Sister   . Depression Maternal Grandmother   . Heart disease Maternal Grandmother   . Depression Maternal Grandfather   . Heart disease Maternal Grandfather   . Depression Paternal Grandmother   . Heart disease Paternal Grandmother   . Depression Paternal Grandfather   . Heart disease Paternal Grandfather    Social History  Substance Use Topics  . Smoking status: Never Smoker   . Smokeless tobacco: Never Used  . Alcohol Use: No    Review of Systems  Constitutional: Positive for fatigue. Negative for fever and chills.  Respiratory: Negative for shortness of breath and wheezing.   Cardiovascular: Negative.   Gastrointestinal: Negative.  Negative for nausea and vomiting.  Genitourinary: Negative.   Musculoskeletal: Positive for arthralgias.  Skin: Positive for color change.  Neurological: Negative for numbness.      Allergies  Amoxicillin  Home Medications   Prior to Admission medications   Medication Sig Start Date End Date Taking? Authorizing Provider  aspirin EC 81 MG tablet Take 1 tablet (81 mg total) by mouth daily. 01/09/14  Yes Kathie Dike, MD  atorvastatin (LIPITOR) 80 MG tablet Take 1 tablet (80 mg total) by mouth daily. 09/29/14  Yes Arnoldo Lenis, MD  carvedilol (COREG) 25 MG tablet Take 25 mg by mouth 2 (two) times daily with a meal.    Yes Historical Provider, MD  furosemide (LASIX) 80 MG tablet Take 80 mg by mouth daily. 08/08/13  Yes Daniel J Angiulli, PA-C  HUMALOG KWIKPEN 100 UNIT/ML KiwkPen Inject 18 Units into the skin 3 (three) times daily before meals. 07/06/15  Yes Historical Provider, MD  hydrALAZINE  (APRESOLINE) 10 MG tablet Take 1 tablet (10 mg total) by mouth 3 (three) times daily. 10/01/13  Yes Burtis Junes, NP  LEVEMIR FLEXTOUCH 100 UNIT/ML Pen Inject 40 Units into the skin daily at 10 pm.  04/23/15  Yes Historical Provider, MD  Loratadine-Pseudoephedrine (CLARITIN-D 12 HOUR PO) Take 1 tablet by mouth daily as needed (allergies).    Yes Historical Provider, MD  losartan (COZAAR) 50 MG tablet Take 1 tablet (50 mg total) by mouth daily. 08/31/15  Yes Arnoldo Lenis, MD  nitroGLYCERIN (NITRODUR - DOSED IN MG/24 HR) 0.2 mg/hr patch Place 0.2 mg onto the skin 2 days.  12/23/14  Yes Historical Provider, MD  pantoprazole (PROTONIX) 40 MG tablet Take 1 tablet (40 mg total) by mouth daily. 08/08/13  Yes Daniel J Angiulli, PA-C  polyethylene glycol (MIRALAX / GLYCOLAX) packet Take 17 g by mouth daily as needed  for mild constipation.    Yes Historical Provider, MD  potassium chloride (K-DUR,KLOR-CON) 10 MEQ tablet Take 1 tablet (10 mEq total) by mouth daily. 04/08/15  Yes Arnoldo Lenis, MD  promethazine (PHENERGAN) 25 MG tablet Take 1 tab every 6-8 hours for nausea. Patient taking differently: Take 25 mg by mouth every 6 (six) hours as needed for nausea. Take 1 tab every 6-8 hours for nausea. 10/18/13  Yes Amy S Esterwood, PA-C  senna-docusate (SENOKOT-S) 8.6-50 MG per tablet Take 1 tablet by mouth daily as needed for mild constipation.  08/08/13  Yes Daniel J Angiulli, PA-C   BP 104/66 mmHg  Pulse 82  Temp(Src) 98 F (36.7 C) (Oral)  Resp 20  Ht 5\' 11"  (1.803 m)  Wt 117.935 kg  BMI 36.28 kg/m2  SpO2 100% Physical Exam  Constitutional: He appears well-developed and well-nourished. No distress.  HENT:  Head: Normocephalic.  Neck: Neck supple.  Cardiovascular: Normal rate.   Pulmonary/Chest: Effort normal. He has no wheezes.  Musculoskeletal: Normal range of motion. He exhibits no edema.  Skin: There is erythema.  Tender erythema left lower leg, dorsally to pretibial area.  Foot is  without erythema.  There is a  scab along his distal foot incision line with a trace of blood on the dressing.    ED Course  Procedures (including critical care time) Labs Review Labs Reviewed  BASIC METABOLIC PANEL - Abnormal; Notable for the following:    Glucose, Bld 239 (*)    BUN 28 (*)    Creatinine, Ser 1.64 (*)    Calcium 8.7 (*)    GFR calc non Af Amer 46 (*)    GFR calc Af Amer 54 (*)    All other components within normal limits  CBC WITH DIFFERENTIAL/PLATELET - Abnormal; Notable for the following:    WBC 15.2 (*)    Hemoglobin 11.9 (*)    HCT 35.3 (*)    Neutro Abs 12.5 (*)    All other components within normal limits    Imaging Review Dg Tibia/fibula Left  02/08/2016  CLINICAL DATA:  Redness and pain lower left leg without injury/ EXAM: LEFT TIBIA AND FIBULA - 2 VIEW COMPARISON:  None. FINDINGS: Calcification of the posterior tibial artery. No fracture or dislocation. Surgical clips in the medial soft tissues of the lower extremity. IMPRESSION: No acute findings Electronically Signed   By: Skipper Cliche M.D.   On: 02/08/2016 12:00   Dg Foot Complete Left  02/08/2016  CLINICAL DATA:  Redness and pain lower left leg without injury/diabetic EXAM: LEFT FOOT - COMPLETE 3+ VIEW COMPARISON:  None. FINDINGS: Status post amputation of all of the toes beyond the bases of the metatarsals. No acute osseous or soft tissue abnormalities identified. Calcification distal anterior and posterior tibial arteries. Small heel spur. IMPRESSION: No acute findings Electronically Signed   By: Skipper Cliche M.D.   On: 02/08/2016 12:01   I have personally reviewed and evaluated these images and lab results as part of my medical decision-making.   EKG Interpretation None      MDM   Final diagnoses:  Cellulitis and abscess of leg  Acute renal insufficiency    Pt given IV fluids, vancomycin.  Obs admission . Discussed with Dr. Jerilee Hoh.  Temp admission orders placed.    Evalee Jefferson,  PA-C 02/08/16 [place], MD 02/09/16 [phone]

## 2016-02-08 NOTE — ED Notes (Signed)
Attempted to call report x 2, RN unavailable.

## 2016-02-08 NOTE — ED Notes (Signed)
PA at bedside.

## 2016-02-08 NOTE — ED Notes (Signed)
Attempted to call report, RN unavailable.

## 2016-02-09 DIAGNOSIS — L03116 Cellulitis of left lower limb: Secondary | ICD-10-CM | POA: Diagnosis not present

## 2016-02-09 DIAGNOSIS — N179 Acute kidney failure, unspecified: Secondary | ICD-10-CM | POA: Diagnosis not present

## 2016-02-09 LAB — BASIC METABOLIC PANEL
ANION GAP: 10 (ref 5–15)
BUN: 24 mg/dL — ABNORMAL HIGH (ref 6–20)
CALCIUM: 8.5 mg/dL — AB (ref 8.9–10.3)
CHLORIDE: 105 mmol/L (ref 101–111)
CO2: 25 mmol/L (ref 22–32)
Creatinine, Ser: 1.33 mg/dL — ABNORMAL HIGH (ref 0.61–1.24)
GFR calc non Af Amer: 60 mL/min — ABNORMAL LOW (ref >60)
Glucose, Bld: 202 mg/dL — ABNORMAL HIGH (ref 65–99)
Potassium: 4 mmol/L (ref 3.5–5.1)
Sodium: 140 mmol/L (ref 135–145)

## 2016-02-09 LAB — GLUCOSE, CAPILLARY
Glucose-Capillary: 178 mg/dL — ABNORMAL HIGH (ref 65–99)
Glucose-Capillary: 208 mg/dL — ABNORMAL HIGH (ref 65–99)

## 2016-02-09 LAB — CBC
HEMATOCRIT: 36.5 % — AB (ref 39.0–52.0)
HEMOGLOBIN: 12 g/dL — AB (ref 13.0–17.0)
MCH: 27 pg (ref 26.0–34.0)
MCHC: 32.9 g/dL (ref 30.0–36.0)
MCV: 82 fL (ref 78.0–100.0)
Platelets: 198 10*3/uL (ref 150–400)
RBC: 4.45 MIL/uL (ref 4.22–5.81)
RDW: 13.7 % (ref 11.5–15.5)
WBC: 9.6 10*3/uL (ref 4.0–10.5)

## 2016-02-09 LAB — HEMOGLOBIN A1C
Hgb A1c MFr Bld: 9.1 % — ABNORMAL HIGH (ref 4.8–5.6)
Mean Plasma Glucose: 214 mg/dL

## 2016-02-09 LAB — HIV ANTIBODY (ROUTINE TESTING W REFLEX): HIV Screen 4th Generation wRfx: NONREACTIVE

## 2016-02-09 MED ORDER — DOXYCYCLINE HYCLATE 100 MG PO TABS: 100.0000 mg | ORAL_TABLET | Freq: Two times a day (BID) | ORAL | Status: DC

## 2016-02-09 NOTE — Progress Notes (Signed)
Pt IV removed, tolerated well. Reviewed discharge information with pt and answered questions.  Pt taken to lobby via wheelchair by NT.

## 2016-02-09 NOTE — Care Management Note (Signed)
Case Management Note  Patient Details  Name: Albert [name] MRN: [id number] Date of Birth: 1961-05-03  Subjective/Objective:    Spoke with patient who is oriented. Sitting on side of bed. Patient stated that he has all equipment and support needed at home . Family assists him to appointments. Denies issues with medications.                 Action/Plan:Home with self care.  Expected Discharge Date:                  Expected Discharge Plan:  Home/Self Care  In-House Referral:     Discharge planning Services  CM Consult  Post Acute Care Choice:    Choice offered to:     DME Arranged:    DME Agency:     HH Arranged:    [place] Agency:     Status of Service:  Completed, signed off  Medicare Important Message Given:    Date Medicare IM Given:    Medicare IM give by:    Date Additional Medicare IM Given:    Additional Medicare Important Message give by:     If discussed at [place] of Stay Meetings, dates discussed:    Additional Comments:  Corrin, Hubbart, RN 02/09/2016, 1:40 PM

## 2016-02-09 NOTE — Care Management Obs Status (Signed)
[place] NOTIFICATION   Patient Details  Name: Albert [name] MRN: [id number] Date of Birth: 04-12-1961   Medicare Observation Status Notification Given:  Other (see comment) (Less than 24 hours)    Eldredge, Kluttz, RN 02/09/2016, 12:27 PM

## 2016-02-09 NOTE — Discharge Summary (Signed)
Physician Discharge Summary  Albert [name] [id number] DOB: 07-Jul-1961 DOA: 02/08/2016  PCP: Wende Neighbors, MD  Admit date: 02/08/2016 Discharge date: 02/09/2016  Time spent: 45 minutes  Recommendations for Outpatient Follow-up:  -Will be discharged home today. -Advised to follow up with PCP in 2 weeks. -Will be discharged on a 14 day course of clindamycin for his LLE cellulitis.   Discharge Diagnoses:  Principal Problem:   Cellulitis Active Problems:   Diabetes mellitus out of control [facility])   Essential hypertension, benign   ARF (acute renal failure) (HCC)   Chronic combined systolic and diastolic CHF (congestive heart failure) ([place])   Discharge Condition: Stable and improved  Filed Weights   02/08/16 0823 02/08/16 1353 02/09/16 0500  Weight: 117.935 kg (260 lb) 119.205 kg (262 lb 12.8 oz) 114.579 kg (252 lb 9.6 oz)    History of present illness:  Albert [name] is a 55 y.o. male with multiple medical comorbidities including hypertension, hyperlipidemia, insulin-dependent diabetes, peripheral vascular disease status post right BKA and left transmetatarsal amputations who presents to the hospital today with a 24-hour history of left lower extremity redness and swelling. He has also had some subjective fever and chills. He presents to the hospital today where he is indeed found to have what appears to be a left lower extremity cellulitis. No signs of osteomyelitis on plain film. He also has acute renal failure with a creatinine of 1.6. Last known creatinine was 1.1 in July 2016. We have been asked to admit him for further evaluation and management  Hospital Course:   Left lower extremity cellulitis -Improved with clindamycin provided in the hospital. -Remains afebrile and without signs of systemic toxicity. -Will discharge home today with a 14 day course of clindamycin.  Acute renal failure -Improved with IV fluids, creatinine 1.3 on discharge from 1.6 on  admission.  Insulin-dependent diabetes mellitus -Fair control  Hypertension -Well-controlled, continue home medications.  Chronic combined CHF -Has been compensated this admission  Procedures:  None   Consultations:  None  Discharge Instructions  Discharge Instructions    Diet - low sodium heart healthy    Complete by:  As directed      Increase activity slowly    Complete by:  As directed             Medication List    TAKE these medications        aspirin EC 81 MG tablet  Take 1 tablet (81 mg total) by mouth daily.     atorvastatin 80 MG tablet  Commonly known as:  LIPITOR  Take 1 tablet (80 mg total) by mouth daily.     carvedilol 25 MG tablet  Commonly known as:  COREG  Take 25 mg by mouth 2 (two) times daily with a meal.     CLARITIN-D 12 HOUR PO  Take 1 tablet by mouth daily as needed (allergies).     doxycycline 100 MG tablet  Commonly known as:  VIBRA-TABS  Take 1 tablet (100 mg total) by mouth 2 (two) times daily.  Notes to Patient:  **New Prescription**Take as directed**     furosemide 80 MG tablet  Commonly known as:  LASIX  Take 80 mg by mouth daily.     HUMALOG KWIKPEN 100 UNIT/ML KiwkPen  Generic drug:  insulin lispro  Inject 18 Units into the skin 3 (three) times daily before meals.  Notes to Patient:  Use as directed      hydrALAZINE 10 MG  tablet  Commonly known as:  APRESOLINE  Take 1 tablet (10 mg total) by mouth 3 (three) times daily.  Notes to Patient:  Take 3 times a day, AM dose given.     LEVEMIR FLEXTOUCH 100 UNIT/ML Pen  Generic drug:  Insulin Detemir  Inject 40 Units into the skin daily at 10 pm.  Notes to Patient:  Use as directed     losartan 50 MG tablet  Commonly known as:  COZAAR  Take 1 tablet (50 mg total) by mouth daily.     nitroGLYCERIN 0.2 mg/hr patch  Commonly known as:  NITRODUR - Dosed in mg/24 hr  Place 0.2 mg onto the skin every 3 (three) days. Applies to foot/ankle area to increase blood flow   Notes to Patient:  Use as directed, new patch applied 02/09/2016 (Tuesday)     pantoprazole 40 MG tablet  Commonly known as:  PROTONIX  Take 1 tablet (40 mg total) by mouth daily.     polyethylene glycol packet  Commonly known as:  MIRALAX / GLYCOLAX  Take 17 g by mouth daily as needed for mild constipation.     potassium chloride 10 MEQ tablet  Commonly known as:  K-DUR,KLOR-CON  Take 1 tablet (10 mEq total) by mouth daily.     promethazine 25 MG tablet  Commonly known as:  PHENERGAN  Take 1 tab every 6-8 hours for nausea.     senna-docusate 8.6-50 MG tablet  Commonly known as:  Senokot-S  Take 1 tablet by mouth daily as needed for mild constipation.       Allergies  Allergen Reactions  . Amoxicillin Nausea And Vomiting       Follow-up Information    Follow up with Wende Neighbors, MD. Schedule an appointment as soon as possible for a visit in 2 weeks.   Specialty:  Internal Medicine   Contact information:   [place] [address] [phone]        The results of significant diagnostics from this hospitalization (including imaging, microbiology, ancillary and laboratory) are listed below for reference.    Significant Diagnostic Studies: Dg Tibia/fibula Left  02/08/2016  CLINICAL DATA:  Redness and pain lower left leg without injury/ EXAM: LEFT TIBIA AND FIBULA - 2 VIEW COMPARISON:  None. FINDINGS: Calcification of the posterior tibial artery. No fracture or dislocation. Surgical clips in the medial soft tissues of the lower extremity. IMPRESSION: No acute findings Electronically Signed   By: Skipper Cliche M.D.   On: 02/08/2016 12:00   Dg Foot Complete Left  02/08/2016  CLINICAL DATA:  Redness and pain lower left leg without injury/diabetic EXAM: LEFT FOOT - COMPLETE 3+ VIEW COMPARISON:  None. FINDINGS: Status post amputation of all of the toes beyond the bases of the metatarsals. No acute osseous or soft tissue abnormalities identified. Calcification  distal anterior and posterior tibial arteries. Small heel spur. IMPRESSION: No acute findings Electronically Signed   By: Skipper Cliche M.D.   On: 02/08/2016 12:01    Microbiology: Recent Results (from the past 240 hour(s))  Culture, blood (routine x 2)     Status: None (Preliminary result)   Collection Time: 02/08/16  4:40 PM  Result Value Ref Range Status   Specimen Description BLOOD RIGHT ARM  Final   Special Requests BOTTLES DRAWN AEROBIC AND ANAEROBIC 6CC  Final   Culture NO GROWTH < 24 HOURS  Final   Report Status PENDING  Incomplete  Culture, blood (routine x 2)  Status: None (Preliminary result)   Collection Time: 02/08/16  4:44 PM  Result Value Ref Range Status   Specimen Description BLOOD RIGHT ARM  Final   Special Requests BOTTLES DRAWN AEROBIC AND ANAEROBIC 6CC  Final   Culture NO GROWTH < 24 HOURS  Final   Report Status PENDING  Incomplete     Labs: Basic Metabolic Panel:  Recent Labs Lab 02/08/16 0920 02/08/16 1640 02/09/16 0520  NA 136  --  140  K 4.1  --  4.0  CL 102  --  105  CO2 24  --  25  GLUCOSE 239*  --  202*  BUN 28*  --  24*  CREATININE 1.64* 1.62* 1.33*  CALCIUM 8.7*  --  8.5*   Liver Function Tests: No results for input(s): AST, ALT, ALKPHOS, BILITOT, PROT, ALBUMIN in the last 168 hours. No results for input(s): LIPASE, AMYLASE in the last 168 hours. No results for input(s): AMMONIA in the last 168 hours. CBC:  Recent Labs Lab 02/08/16 0920 02/08/16 1640 02/09/16 0520  WBC 15.2* 10.8* 9.6  NEUTROABS 12.5*  --   --   HGB 11.9* 11.3* 12.0*  HCT 35.3* 34.2* 36.5*  MCV 80.6 81.8 82.0  PLT 196 196 198   Cardiac Enzymes: No results for input(s): CKTOTAL, CKMB, CKMBINDEX, TROPONINI in the last 168 hours. BNP: BNP (last 3 results) No results for input(s): BNP in the last 8760 hours.  ProBNP (last 3 results) No results for input(s): PROBNP in the last 8760 hours.  CBG:  Recent Labs Lab 02/08/16 2035 02/09/16 0751  02/09/16 1117  GLUCAP 195* 178* 208*       Signed:  Lelon Frohlich  [facility] Pager: [phone] 02/09/2016, 3:10 PM

## 2016-02-09 NOTE — Progress Notes (Signed)
Inpatient Diabetes Program Recommendations  AACE/ADA: New Consensus Statement on Inpatient Glycemic Control (2015)  Target Ranges:  Prepandial:   less than 140 mg/dL      Peak postprandial:   less than 180 mg/dL (1-2 hours)      Critically ill patients:  140 - 180 mg/dL   Review of Glycemic Control Results for Albert [name], Albert [name] (MRN [id number]) as of 02/09/2016 12:24  Ref. Range 02/08/2016 09:20  Hemoglobin A1C Latest Ref Range: 4.8-5.6 % 9.1 (H)  Results for Albert [name], Albert [name] (MRN [id number]) as of 02/09/2016 12:24  Ref. Range 02/08/2016 20:35 02/09/2016 07:51 02/09/2016 11:17  Glucose-Capillary Latest Ref Range: 65-99 mg/dL 195 (H) 178 (H) 208 (H)   Diabetes history: DM Type 2 Outpatient Diabetes medications: Levemir 40 units q hs + Humalog 18 units tid meal coverage Current orders for Inpatient glycemic control: Levemir 40 units q hs + Humalog 4 units tid with meal coverage  Inpatient Diabetes Program Recommendations:  Noted A1c 9.1. Please consider increase in meal coverage to 8 units tid (hold if eats < 50 %)).   Thank you, Albert [name]. Albert Malerba, RN, MSN, CDE Inpatient Glycemic Control Team Team Pager [phone] (8am-5pm) 02/09/2016 12:28 PM

## 2016-02-13 LAB — CULTURE, BLOOD (ROUTINE X 2)
CULTURE: NO GROWTH
Culture: NO GROWTH

## 2016-02-19 DIAGNOSIS — E1142 Type 2 diabetes mellitus with diabetic polyneuropathy: Secondary | ICD-10-CM | POA: Diagnosis not present

## 2016-02-19 DIAGNOSIS — L97522 Non-pressure chronic ulcer of other part of left foot with fat layer exposed: Secondary | ICD-10-CM | POA: Diagnosis not present

## 2016-02-24 DIAGNOSIS — E782 Mixed hyperlipidemia: Secondary | ICD-10-CM | POA: Diagnosis not present

## 2016-02-24 DIAGNOSIS — I10 Essential (primary) hypertension: Secondary | ICD-10-CM | POA: Diagnosis not present

## 2016-02-24 DIAGNOSIS — E119 Type 2 diabetes mellitus without complications: Secondary | ICD-10-CM | POA: Diagnosis not present

## 2016-02-26 DIAGNOSIS — E1165 Type 2 diabetes mellitus with hyperglycemia: Secondary | ICD-10-CM | POA: Diagnosis not present

## 2016-02-26 DIAGNOSIS — E876 Hypokalemia: Secondary | ICD-10-CM | POA: Diagnosis not present

## 2016-02-26 DIAGNOSIS — Z0000 Encounter for general adult medical examination without abnormal findings: Secondary | ICD-10-CM | POA: Diagnosis not present

## 2016-02-26 DIAGNOSIS — I2510 Atherosclerotic heart disease of native coronary artery without angina pectoris: Secondary | ICD-10-CM | POA: Diagnosis not present

## 2016-02-26 DIAGNOSIS — S98919A Complete traumatic amputation of unspecified foot, level unspecified, initial encounter: Secondary | ICD-10-CM | POA: Diagnosis not present

## 2016-03-18 DIAGNOSIS — L97522 Non-pressure chronic ulcer of other part of left foot with fat layer exposed: Secondary | ICD-10-CM | POA: Diagnosis not present

## 2016-03-18 DIAGNOSIS — E1142 Type 2 diabetes mellitus with diabetic polyneuropathy: Secondary | ICD-10-CM | POA: Diagnosis not present

## 2016-03-18 DIAGNOSIS — Z89432 Acquired absence of left foot: Secondary | ICD-10-CM | POA: Diagnosis not present

## 2016-05-19 DIAGNOSIS — S98312D Complete traumatic amputation of left midfoot, subsequent encounter: Secondary | ICD-10-CM | POA: Diagnosis not present

## 2016-05-19 DIAGNOSIS — Z89511 Acquired absence of right leg below knee: Secondary | ICD-10-CM | POA: Diagnosis not present

## 2016-05-19 DIAGNOSIS — E1142 Type 2 diabetes mellitus with diabetic polyneuropathy: Secondary | ICD-10-CM | POA: Diagnosis not present

## 2016-05-19 DIAGNOSIS — Z89432 Acquired absence of left foot: Secondary | ICD-10-CM | POA: Diagnosis not present

## 2016-05-25 DIAGNOSIS — E1165 Type 2 diabetes mellitus with hyperglycemia: Secondary | ICD-10-CM | POA: Diagnosis not present

## 2016-05-25 DIAGNOSIS — E782 Mixed hyperlipidemia: Secondary | ICD-10-CM | POA: Diagnosis not present

## 2016-05-25 DIAGNOSIS — D509 Iron deficiency anemia, unspecified: Secondary | ICD-10-CM | POA: Diagnosis not present

## 2016-06-06 DIAGNOSIS — D508 Other iron deficiency anemias: Secondary | ICD-10-CM | POA: Diagnosis not present

## 2016-06-06 DIAGNOSIS — I2510 Atherosclerotic heart disease of native coronary artery without angina pectoris: Secondary | ICD-10-CM | POA: Diagnosis not present

## 2016-06-06 DIAGNOSIS — I10 Essential (primary) hypertension: Secondary | ICD-10-CM | POA: Diagnosis not present

## 2016-06-06 DIAGNOSIS — E1165 Type 2 diabetes mellitus with hyperglycemia: Secondary | ICD-10-CM | POA: Diagnosis not present

## 2016-06-13 ENCOUNTER — Other Ambulatory Visit: Payer: Self-pay | Admitting: Cardiology

## 2016-06-22 DIAGNOSIS — H2513 Age-related nuclear cataract, bilateral: Secondary | ICD-10-CM | POA: Diagnosis not present

## 2016-06-22 DIAGNOSIS — E103293 Type 1 diabetes mellitus with mild nonproliferative diabetic retinopathy without macular edema, bilateral: Secondary | ICD-10-CM | POA: Diagnosis not present

## 2016-06-22 DIAGNOSIS — E1065 Type 1 diabetes mellitus with hyperglycemia: Secondary | ICD-10-CM | POA: Diagnosis not present

## 2016-06-22 DIAGNOSIS — Z794 Long term (current) use of insulin: Secondary | ICD-10-CM | POA: Diagnosis not present

## 2016-09-19 ENCOUNTER — Other Ambulatory Visit: Payer: Self-pay | Admitting: *Deleted

## 2016-09-19 MED ORDER — LOSARTAN POTASSIUM 50 MG PO TABS: 50.0000 mg | ORAL_TABLET | Freq: Every day | ORAL | 0 refills | Status: DC

## 2016-10-05 DIAGNOSIS — D509 Iron deficiency anemia, unspecified: Secondary | ICD-10-CM | POA: Diagnosis not present

## 2016-10-05 DIAGNOSIS — E782 Mixed hyperlipidemia: Secondary | ICD-10-CM | POA: Diagnosis not present

## 2016-10-05 DIAGNOSIS — E1165 Type 2 diabetes mellitus with hyperglycemia: Secondary | ICD-10-CM | POA: Diagnosis not present

## 2016-10-07 DIAGNOSIS — I10 Essential (primary) hypertension: Secondary | ICD-10-CM | POA: Diagnosis not present

## 2016-10-07 DIAGNOSIS — Z0000 Encounter for general adult medical examination without abnormal findings: Secondary | ICD-10-CM | POA: Diagnosis not present

## 2016-10-07 DIAGNOSIS — R110 Nausea: Secondary | ICD-10-CM | POA: Diagnosis not present

## 2016-10-07 DIAGNOSIS — E1165 Type 2 diabetes mellitus with hyperglycemia: Secondary | ICD-10-CM | POA: Diagnosis not present

## 2016-10-07 DIAGNOSIS — E782 Mixed hyperlipidemia: Secondary | ICD-10-CM | POA: Diagnosis not present

## 2016-10-28 ENCOUNTER — Ambulatory Visit: Payer: Medicare Other | Admitting: Cardiology

## 2016-11-18 ENCOUNTER — Ambulatory Visit (INDEPENDENT_AMBULATORY_CARE_PROVIDER_SITE_OTHER): Payer: Medicare Other | Admitting: Orthopedic Surgery

## 2016-11-18 ENCOUNTER — Encounter (INDEPENDENT_AMBULATORY_CARE_PROVIDER_SITE_OTHER): Payer: Self-pay | Admitting: Orthopedic Surgery

## 2016-11-18 VITALS — Ht 71.0 in | Wt 252.0 lb

## 2016-11-18 DIAGNOSIS — L97421 Non-pressure chronic ulcer of left heel and midfoot limited to breakdown of skin: Secondary | ICD-10-CM | POA: Insufficient documentation

## 2016-11-18 MED ORDER — NITROGLYCERIN 0.2 MG/HR TD PT24: 0.2000 mg | MEDICATED_PATCH | Freq: Every day | TRANSDERMAL | 12 refills | Status: DC

## 2016-11-18 NOTE — Addendum Note (Signed)
Addended by: Meridee Score on: 11/18/2016 11:11 AM   Modules accepted: Orders

## 2016-11-18 NOTE — Progress Notes (Addendum)
Office Visit Note   Patient: Albert [name]           Date of Birth: [birth year]           MRN: [id number] Visit Date: 11/18/2016              Requested by: Celene Squibb, MD [address] [place], [place] [address] PCP: Wende Neighbors, MD  Chief Complaint  Patient presents with  . Right Leg - Follow-up    Right below the knee amputation  . Left Foot - Follow-up    Left transmet amputation     HPI: Patient is in a prosthetic on the right and this is doing well. pts has a history of a left transmet amputation and states that there is some callus build up that he would ike to have trimmed.     Assessment & Plan: Visit Diagnoses:  1. Midfoot ulcer, left, limited to breakdown of skin ([place])     Plan: Continue protective shoe wear continue wound care follow-up for reevaluation of the wound plantar aspect left midfoot amputation  Follow-Up Instructions: Return in about 3 months (around 02/15/2017).   Ortho Exam Examination patient is alert oriented no adenopathy well-dressed normal affect normal respiratory effort he does have an antalgic gait he is well fitting prosthesis of the right lower extremity left lower extremity he has a new ulcer over the midfoot amputation. After informed consent a 10 blade knife was used to debride the skin and soft tissue back to bleeding viable granulation tissue this was touched with silver nitrate a dry dressing was applied ulcer is 15 x 20 mm an 0.1 mm deep there is no exposed tendon or bone no cellulitis no drainage  Imaging: No results found.  Orders:  No orders of the defined types were placed in this encounter.  No orders of the defined types were placed in this encounter.    Procedures: No procedures performed  Clinical Data: No additional findings.  Subjective: Review of Systems  Objective: Vital Signs: Ht 5\' 11"  (1.803 m)   Wt 252 lb (114.3 kg)   BMI 35.15 kg/m   Specialty Comments:  No specialty comments available.  PMFS  History: Patient Active Problem List   Diagnosis Date Noted  . Midfoot ulcer, left, limited to breakdown of skin ([place]) 11/18/2016  . ARF (acute renal failure) ([place]) 02/08/2016  . Chronic combined systolic and diastolic CHF (congestive heart failure) ([place]) 02/08/2016  . Cellulitis of left leg 05/16/2015  . Cellulitis 05/16/2015  . Splenic infarct 01/06/2014  . Splenic infarction 01/06/2014  . Acute systolic CHF (congestive heart failure) ([place]) 08/03/2013  . Hx of right BKA ([place]) 07/26/2013  . Anemia 07/15/2013  . Hyperkalemia 07/15/2013  . Hyposmolality and/or hyponatremia 07/15/2013  . Acute respiratory failure ([place]) 07/15/2013  . Mitral regurgitation 07/15/2013  . Oliguria 07/15/2013  . Bacteremia due to Staphylococcus aureus 07/15/2013  . Hyperchloremia 07/15/2013  . Acute kidney failure ([place]) 07/14/2013  . Hypotension, unspecified 07/14/2013  . Acute systolic heart failure ([place]) 07/12/2013  . Acute pulmonary edema ([place]) 07/11/2013  . Pneumonia, organism unspecified(486) 07/11/2013  . Hypoxemia 07/11/2013  . Hx of BKA ([place]) 07/04/2013  . Cardiomyopathy, ischemic 07/03/2013  . Coronary atherosclerosis of native coronary artery 07/01/2013  . NSTEMI (non-ST elevated myocardial infarction) ([place]) 06/26/2013  . AKI (acute kidney injury) ([place]) 06/25/2013  . Foot abscess, right 06/25/2013  . Shock circulatory ([place]) 06/25/2013  . Dehydration with hyponatremia 06/25/2013  . Leukocytosis, unspecified  06/25/2013  . Sepsis(995.91) 06/25/2013  . Aftercare following surgery of the circulatory system, [place] 02/11/2013  . Peripheral vascular disease, unspecified 12/31/2012  . Diabetes mellitus out of control ([place]) 12/17/2012  . Essential hypertension, benign 12/17/2012  . Atherosclerotic peripheral vascular disease ([place]) 12/17/2012  . Gangrene ([place]) 12/08/2012  . Hypercholesteremia    Past Medical History:  Diagnosis Date  . Allergic rhinitis   . Anemia    takes supplement  . Chronic  cough   . Chronic sinusitis   . Complication of anesthesia    couldn't swallow and talk  . CVA (cerebral infarction) 2011   R hand deficit  . Diabetes mellitus without complication ([place]) [id number]   last HbA1c ~9 .... fasting 160s  . GERD (gastroesophageal reflux disease)   . Hypercholesteremia   . Hypertension   . Myocardial infarction   . Osteomyelitis ([place])    first and third metatarsal  . Pneumonia    hx of walking  . Splenic infarct    setting of lupus anticoagulant  . Stroke [facility])    right arm weakness    Family History  Problem Relation Age of Onset  . Diabetes    . Pancreatic cancer Mother   . Diabetes Mother   . Hyperlipidemia Mother   . Breast cancer Sister   . Depression Maternal Grandmother   . Heart disease Maternal Grandmother   . Depression Maternal Grandfather   . Heart disease Maternal Grandfather   . Depression Paternal Grandmother   . Heart disease Paternal Grandmother   . Depression Paternal Grandfather   . Heart disease Paternal Grandfather     Past Surgical History:  Procedure Laterality Date  . 4th Toe Amputation Left Jan. 2014   4th toe in [place]  . AMPUTATION Left 12/08/2012   Procedure: AMPUTATION DIGIT;  Surgeon: Mcarthur Rossetti, MD;  Location: [place];  Service: Orthopedics;  Laterality: Left;  3rd toe amputation possible 5th toe amputation  . AMPUTATION Left 12/11/2012   Procedure: Amputation of 2nd Toe;  Surgeon: Mcarthur Rossetti, MD;  Location: [place];  Service: Orthopedics;  Laterality: Left;  . AMPUTATION Right 06/29/2013   Procedure: AMPUTATION BELOW KNEE;  Surgeon: Newt Minion, MD;  Location: [place];  Service: Orthopedics;  Laterality: Right;  . AMPUTATION Left 08/29/2014   Procedure: Left Great Toe Amputation at MTP;  Surgeon: Newt Minion, MD;  Location: [place];  Service: Orthopedics;  Laterality: Left;  . AMPUTATION Left 10/31/2014   Procedure: Midfoot Amputation;  Surgeon: Newt Minion, MD;  Location: [place];  Service:  Orthopedics;  Laterality: Left;  . BYPASS GRAFT POPLITEAL TO TIBIAL Left 12/13/2012   Procedure: BYPASS GRAFT POPLITEAL TO TIBIAL;  Surgeon: Serafina Mitchell, MD;  Location: [facility];  Service: Vascular;  Laterality: Left;  Left Popliteal to Posterior Tibial Bypass Graft with reversed saphenous vein graft  . CARDIAC CATHETERIZATION    . I&D EXTREMITY Left 12/08/2012   Procedure: IRRIGATION AND DEBRIDEMENT EXTREMITY;  Surgeon: Mcarthur Rossetti, MD;  Location: [place];  Service: Orthopedics;  Laterality: Left;  . I&D EXTREMITY Left 12/11/2012   Procedure: REPEAT I&D LEFT FOOT;  Surgeon: Mcarthur Rossetti, MD;  Location: [place];  Service: Orthopedics;  Laterality: Left;  . INCISION AND DRAINAGE Right 06/25/2013   Procedure: INCISION AND DRAINAGE RIGHT FOOT;  Surgeon: Mcarthur Rossetti, MD;  Location: [facility];  Service: Orthopedics;  Laterality: Right;  . LEFT HEART CATHETERIZATION WITH CORONARY ANGIOGRAM [name] 06/28/2013  Procedure: LEFT HEART CATHETERIZATION WITH CORONARY ANGIOGRAM;  Surgeon: Burnell Blanks, MD;  Location: [facility] CATH LAB;  Service: Cardiovascular;  Laterality: [name];  . PENILE PROSTHESIS IMPLANT    . STUMP REVISION Right 08/16/2013   Procedure: STUMP REVISION- right Below Knee Amputation;  Surgeon: Newt Minion, MD;  Location: [place];  Service: Orthopedics;  Laterality: Right;  . TEE WITHOUT CARDIOVERSION [name] 07/03/2013   Procedure: TRANSESOPHAGEAL ECHOCARDIOGRAM (TEE);  Surgeon: Larey Dresser, MD;  Location: [place];  Service: Cardiovascular;  Laterality: [name];  . TONSILLECTOMY AND ADENOIDECTOMY     Social History   Occupational History  . 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 Smoking status: Never Smoker  . Smokeless tobacco: Never Used  . Alcohol use No  . Drug use: No  . Sexual activity: No

## 2016-11-25 ENCOUNTER — Other Ambulatory Visit: Payer: Self-pay | Admitting: Cardiology

## 2016-12-12 ENCOUNTER — Ambulatory Visit (INDEPENDENT_AMBULATORY_CARE_PROVIDER_SITE_OTHER): Payer: Medicare Other | Admitting: Cardiology

## 2016-12-12 ENCOUNTER — Encounter: Payer: Self-pay | Admitting: Cardiology

## 2016-12-12 VITALS — BP 116/76 | HR 71 | Ht 71.0 in | Wt 264.2 lb

## 2016-12-12 DIAGNOSIS — I340 Nonrheumatic mitral (valve) insufficiency: Secondary | ICD-10-CM

## 2016-12-12 DIAGNOSIS — E782 Mixed hyperlipidemia: Secondary | ICD-10-CM | POA: Diagnosis not present

## 2016-12-12 DIAGNOSIS — I2510 Atherosclerotic heart disease of native coronary artery without angina pectoris: Secondary | ICD-10-CM

## 2016-12-12 DIAGNOSIS — I10 Essential (primary) hypertension: Secondary | ICD-10-CM

## 2016-12-12 DIAGNOSIS — I5022 Chronic systolic (congestive) heart failure: Secondary | ICD-10-CM

## 2016-12-12 NOTE — Patient Instructions (Signed)
Your physician wants you to follow-up in: 6 MONTHS WITH DR. BRANCH You will receive a reminder letter in the mail two months in advance. If you don't receive a letter, please call our office to schedule the follow-up appointment.  Your physician recommends that you continue on your current medications as directed. Please refer to the Current Medication list given to you today.  Your physician has requested that you have an echocardiogram. Echocardiography is a painless test that uses sound waves to create images of your heart. It provides your doctor with information about the size and shape of your heart and how well your heart's chambers and valves are working. This procedure takes approximately one hour. There are no restrictions for this procedure.  Thank you for choosing [facility]!!   

## 2016-12-12 NOTE — Progress Notes (Signed)
Clinical Summary Mr. Ziel is a 56 y.o.male seen today for follow up of the following medical problems.   1. Chronic combined systolic/diastolic heart failure - ICM, LVEF 40-45% by echo 12/2013, grade II diastolic dysfunction  - no recent SOB or DOE. No recent LE edema - compliant with meds  2. Mitral regurgitation - moderate by TEE 06/2013 and by TTE 12/2013 - no recent symptoms.   3. CAD - hx of demand ischemia 06/2013 in setting of sepsis with necrotic foot wound. - cath at that time 06/2013 LM patent, LAD 40% prox, 70% mid follow by 60% lesion, distal 90%. Diag 99%. LCX 80-90% prox, OM1 80%, OM2 99%, OM3 99 % (all OMs are small), RCA small non-dom mid 99%. LVEF 40% by LVgram.  - CAD was managed medically after discussions with CT surgery at that time, with plans to reevaluate once recurring infection issues resolved.   - denies any recent chest pain since last visit - he reports his leg wound has finally healed, however would not want to consider heart surgery at this time.   4. PAD - followed by vascular and [facility] - previous right BKA, previous multiple toe amputations. 08/30/14 treated for osteo and ulcer of left great toe with amputation. Jan 2016 had left midfoot amputation.  - Followed by ortho Dr Sharol Given.   - reports left foot has healed.   5. Hyperlipidemia - compliant with statin - Lipid panel 09/2016 TC 91 HDL 23 TG 180 LDL 42  6. HTN - does not check at home.  - compliant with meds   Past Medical History:  Diagnosis Date  . Allergic rhinitis   . Anemia    takes supplement  . Chronic cough   . Chronic sinusitis   . Complication of anesthesia    couldn't swallow and talk  . CVA (cerebral infarction) 2011   R hand deficit  . Diabetes mellitus without complication ([place]) [id number]   last HbA1c ~9 .... fasting 160s  . GERD (gastroesophageal reflux disease)   . Hypercholesteremia   . Hypertension   . Myocardial infarction   . Osteomyelitis  ([place])    first and third metatarsal  . Pneumonia    hx of walking  . Splenic infarct    setting of lupus anticoagulant  . Stroke [facility])    right arm weakness     Allergies  Allergen Reactions  . Amoxicillin Nausea And Vomiting     Current Outpatient Prescriptions  Medication Sig Dispense Refill  . aspirin EC 81 MG tablet Take 1 tablet (81 mg total) by mouth daily. 30 tablet 1  . atorvastatin (LIPITOR) 80 MG tablet Take 1 tablet (80 mg total) by mouth daily. 30 tablet 6  . carvedilol (COREG) 25 MG tablet Take 25 mg by mouth 2 (two) times daily with a meal.     . doxycycline (VIBRA-TABS) 100 MG tablet Take 1 tablet (100 mg total) by mouth 2 (two) times daily. 28 tablet 0  . furosemide (LASIX) 80 MG tablet Take 80 mg by mouth daily.    Marland Kitchen HUMALOG KWIKPEN 100 UNIT/ML KiwkPen Inject 18 Units into the skin 3 (three) times daily before meals.    . hydrALAZINE (APRESOLINE) 10 MG tablet Take 1 tablet (10 mg total) by mouth 3 (three) times daily. 120 tablet 1  . LEVEMIR FLEXTOUCH 100 UNIT/ML Pen Inject 40 Units into the skin daily at 10 pm.   5  . Loratadine-Pseudoephedrine (CLARITIN-D 12 HOUR PO) Take  1 tablet by mouth daily as needed (allergies).     . losartan (COZAAR) 50 MG tablet TAKE 1 TABLET BY MOUTH  DAILY 90 tablet 0  . nitroGLYCERIN (NITRODUR - DOSED IN MG/24 HR) 0.2 mg/hr patch Place 0.2 mg onto the skin every 3 (three) days. Applies to foot/ankle area to increase blood flow    . nitroGLYCERIN (NITRODUR - DOSED IN MG/24 HR) 0.2 mg/hr patch Place 1 patch (0.2 mg total) onto the skin daily. 30 patch 12  . pantoprazole (PROTONIX) 40 MG tablet Take 1 tablet (40 mg total) by mouth daily. 30 tablet 1  . polyethylene glycol (MIRALAX / GLYCOLAX) packet Take 17 g by mouth daily as needed for mild constipation.     . potassium chloride (K-DUR,KLOR-CON) 10 MEQ tablet Take 1 tablet (10 mEq total) by mouth daily. 30 tablet 6  . promethazine (PHENERGAN) 25 MG tablet Take 1 tab every 6-8 hours for  nausea. (Patient taking differently: Take 25 mg by mouth every 6 (six) hours as needed for nausea. Take 1 tab every 6-8 hours for nausea.) 40 tablet 1  . senna-docusate (SENOKOT-S) 8.6-50 MG per tablet Take 1 tablet by mouth daily as needed for mild constipation.      No current facility-administered medications for this visit.      Past Surgical History:  Procedure Laterality Date  . 4th Toe Amputation Left Jan. 2014   4th toe in [place]  . AMPUTATION Left 12/08/2012   Procedure: AMPUTATION DIGIT;  Surgeon: Mcarthur Rossetti, MD;  Location: [place];  Service: Orthopedics;  Laterality: Left;  3rd toe amputation possible 5th toe amputation  . AMPUTATION Left 12/11/2012   Procedure: Amputation of 2nd Toe;  Surgeon: Mcarthur Rossetti, MD;  Location: [place];  Service: Orthopedics;  Laterality: Left;  . AMPUTATION Right 06/29/2013   Procedure: AMPUTATION BELOW KNEE;  Surgeon: Newt Minion, MD;  Location: [place];  Service: Orthopedics;  Laterality: Right;  . AMPUTATION Left 08/29/2014   Procedure: Left Great Toe Amputation at MTP;  Surgeon: Newt Minion, MD;  Location: [place];  Service: Orthopedics;  Laterality: Left;  . AMPUTATION Left 10/31/2014   Procedure: Midfoot Amputation;  Surgeon: Newt Minion, MD;  Location: [place];  Service: Orthopedics;  Laterality: Left;  . BYPASS GRAFT POPLITEAL TO TIBIAL Left 12/13/2012   Procedure: BYPASS GRAFT POPLITEAL TO TIBIAL;  Surgeon: Serafina Mitchell, MD;  [facility];  Service: Vascular;  Laterality: Left;  Left Popliteal to Posterior Tibial Bypass Graft with reversed saphenous vein graft  . CARDIAC CATHETERIZATION    . I&D EXTREMITY Left 12/08/2012   Procedure: IRRIGATION AND DEBRIDEMENT EXTREMITY;  Surgeon: Mcarthur Rossetti, MD;  Location: [place];  Service: Orthopedics;  Laterality: Left;  . I&D EXTREMITY Left 12/11/2012   Procedure: REPEAT I&D LEFT FOOT;  Surgeon: Mcarthur Rossetti, MD;  Location: [place];  Service: Orthopedics;  Laterality:  Left;  . INCISION AND DRAINAGE Right 06/25/2013   Procedure: INCISION AND DRAINAGE RIGHT FOOT;  Surgeon: Mcarthur Rossetti, MD;  [facility];  Service: Orthopedics;  Laterality: Right;  . LEFT HEART CATHETERIZATION WITH CORONARY ANGIOGRAM [name] 06/28/2013   Procedure: LEFT HEART CATHETERIZATION WITH CORONARY ANGIOGRAM;  Surgeon: Burnell Blanks, MD;  Location: [facility] CATH LAB;  Service: Cardiovascular;  Laterality: [name];  . PENILE PROSTHESIS IMPLANT    . STUMP REVISION Right 08/16/2013   Procedure: STUMP REVISION- right Below Knee Amputation;  Surgeon: Newt Minion, MD;  Location: [facility];  Service: Orthopedics;  Laterality: Right;  . TEE WITHOUT CARDIOVERSION [name] 07/03/2013   Procedure: TRANSESOPHAGEAL ECHOCARDIOGRAM (TEE);  Surgeon: Larey Dresser, MD;  Location: [place];  Service: Cardiovascular;  Laterality: [name];  . TONSILLECTOMY AND ADENOIDECTOMY       Allergies  Allergen Reactions  . Amoxicillin Nausea And Vomiting      Family History  Problem Relation Age of Onset  . Diabetes    . Pancreatic cancer Mother   . Diabetes Mother   . Hyperlipidemia Mother   . Breast cancer Sister   . Depression Maternal Grandmother   . Heart disease Maternal Grandmother   . Depression Maternal Grandfather   . Heart disease Maternal Grandfather   . Depression Paternal Grandmother   . Heart disease Paternal Grandmother   . Depression Paternal Grandfather   . Heart disease Paternal Grandfather      Social History Mr. Lacourt reports that he has never smoked. He has never used smokeless tobacco. Mr. Amesquita reports that he does not drink alcohol.   Review of Systems CONSTITUTIONAL: No weight loss, fever, chills, weakness or fatigue.  HEENT: Eyes: No visual loss, blurred vision, double vision or yellow sclerae.No hearing loss, sneezing, congestion, runny nose or sore throat.  SKIN: No rash or itching.  CARDIOVASCULAR: per HPI RESPIRATORY: No shortness of breath, cough or sputum.    GASTROINTESTINAL: No anorexia, nausea, vomiting or diarrhea. No abdominal pain or blood.  GENITOURINARY: No burning on urination, no polyuria NEUROLOGICAL: No headache, dizziness, syncope, paralysis, ataxia, numbness or tingling in the extremities. No change in bowel or bladder control.  MUSCULOSKELETAL: No muscle, back pain, joint pain or stiffness.  LYMPHATICS: No enlarged nodes. No history of splenectomy.  PSYCHIATRIC: No history of depression or anxiety.  ENDOCRINOLOGIC: No reports of sweating, cold or heat intolerance. No polyuria or polydipsia.  Marland Kitchen   Physical Examination Vitals:   12/12/16 1016  BP: 116/76  Pulse: 71   Vitals:   12/12/16 1016  Weight: 264 lb 3.2 oz (119.8 kg)  Height: 5\' 11"  (1.803 m)    Gen: resting comfortably, no acute distress HEENT: no scleral icterus, pupils equal round and reactive, no palptable cervical adenopathy,  CV: RRR, no m/r/g, no jvd Resp: Clear to auscultation bilaterally GI: abdomen is soft, non-tender, non-distended, normal bowel sounds, no hepatosplenomegaly MSK: extremities are warm, no edema.  Skin: warm, no rash Neuro:  no focal deficits Psych: appropriate affect   Diagnostic Studies 06/2013 Cath Hemodynamic Findings: Central aortic pressure: 90/61 Left ventricular pressure: 90/19/29  Angiographic Findings:  Left main: Short segment without obstruction.   Left Anterior Descending Artery: Moderate caliber diffusely diseased vessel that courses to the apex. The proximal vessel has diffuse 40% stenosis. The mid vessel has a 70% stenosis followed by a 60% stenosis. The distal vessel has diffuse 90% stenosis. The diagonal is a moderate caliber vessel with ostial 99% stenosis with 99% disease extending into the mid vessel.   Circumflex Artery: Large caliber vessel with proximal 80-90% stenosis. There are three small to moderate caliber obtuse marginal branches. The first OM has mid 80% stenosis. The second OM has a proximal  99% stenosis. The third OM has diffuse 99% stenosis. The left sided posterolateral Mirakle Tomlin has 99% stenosis.   Right Coronary Artery: Small non-dominant vessel with mid 99% stenosis.   Left Ventricular Angiogram: LVEF=40%  Impression: 1. Triple vessel CAD 2. NSTEMI secondary to demand ischemia from sepsis with severe underlying CAD 3. Mild LV systolic dysfunction  Recommendations:  Complex situation. The only option for coronary revascularization is CABG. He is not a favorable candidate for CABG at this time with necrotic right foot, sepsis. Even though not ideal, would favor medical management of CAD while undergoing treatment of his necrotic foot. Would proceed with right BKA this weekend. I have discussed the case with CT surgery who agrees that CABG is not a good option at this time. Will ask CT surgery to see after he recovers from his BKA. Continue ASA, beta blocker, statin.    Complications: None. The patient tolerated the procedure well.     11/2012 Carotid US Summary:  - No significant extracranial carotid artery stenosis demonstrated. Vertebrals are patent with antegrade flow. - ICA/CCA ratio= right = 0.94. left = 0.73. Other specific details can be found in the table(s) above.  Prepared and Electronically Authenticated by  03/2015 Carotid US IMPRESSION: Mild atherosclerotic disease in the carotid arteries, right side greater the left. The peak systolic velocity in the proximal right internal carotid artery is slightly elevated. Estimated degree of stenosis in the right internal carotid artery is close to 50%.  Estimated degree of stenosis in the left internal carotid artery is less than 50%.  Patent vertebral arteries.   12/07/15 Clinic EKG (performed and reviewed in clinic): NSR    Assessment and Plan  1. Chronic systolic heart failure - ICM, LVEF 40-45% by echo 12/2013 with grade II diastolic dysfunction. NYHA II.  - we will repeat  echo. Continue current meds  2. CAD - triple vessel CAD managed medically. Seen by CT surgery, due to ongoing recurrent LE leg infections and multiple surgeries, as well as overall high risk recs were for medical therapy, reconsideration if other comorbidities significanlty improved - no recent symptoms. He is not in favor of considering CABG at this time. Given he has done well and has no symptoms reasonable to continue medical therapy at this time.  -EKG in clinic show SR, no ischemic changes  3. Hyperlipidemia - LDL at goal, he is on high dose statin in setting of CAD and PAD. Counsled on dietary and lifestyle modification to improve TGs and HDL  4. PAD - per vascular and ortho  5. Mitral regurgitation - moderate by recent imaging in 2015 - we will repeat echo  6. HTN - at goal, he will continue current meds    F/u 6 months   Arnoldo Lenis, M.D.

## 2017-01-04 ENCOUNTER — Other Ambulatory Visit: Payer: Medicare Other

## 2017-01-25 ENCOUNTER — Telehealth: Payer: Self-pay

## 2017-01-25 NOTE — Telephone Encounter (Signed)
Pt received letter from DS to be triaged. Please call [phone]

## 2017-01-26 ENCOUNTER — Ambulatory Visit (INDEPENDENT_AMBULATORY_CARE_PROVIDER_SITE_OTHER): Payer: Medicare Other

## 2017-01-26 ENCOUNTER — Other Ambulatory Visit: Payer: Self-pay

## 2017-01-26 DIAGNOSIS — I340 Nonrheumatic mitral (valve) insufficiency: Secondary | ICD-10-CM

## 2017-01-26 LAB — ECHOCARDIOGRAM COMPLETE
EERAT: 11.47
EWDT: 198 ms
FS: 25 % — AB (ref 28–44)
IVS/LV PW RATIO, ED: 0.91
LA ID, A-P, ES: 34 mm
LA diam index: 1.36 cm/m2
LA vol A4C: 69.1 ml
LA vol index: 28 mL/m2
LAVOL: 69.8 mL
LDCA: 3.8 cm2
LEFT ATRIUM END SYS DIAM: 34 mm
LV TDI E'LATERAL: 7.74
LV TDI E'MEDIAL: 6.67
LV e' LATERAL: 7.74 cm/s
LVEEAVG: 11.47
LVEEMED: 11.47
LVOT SV: 78 mL
LVOT VTI: 20.4 cm
LVOTD: 22 mm
LVOTPV: 93.8 cm/s
MV Dec: 198
MV pk A vel: 102 m/s
MV pk E vel: 88.8 m/s
MVPG: 3 mmHg
PW: 10.3 mm — AB (ref 0.6–1.1)
RV LATERAL S' VELOCITY: 8.51 cm/s
RV TAPSE: 20.1 mm
Reg peak vel: 228 cm/s
TR max vel: 228 cm/s

## 2017-02-01 ENCOUNTER — Telehealth: Payer: Self-pay

## 2017-02-01 NOTE — Telephone Encounter (Signed)
Patient called back to schedule tcs

## 2017-02-01 NOTE — Telephone Encounter (Signed)
PATIENT CALLED BACK TO SCHEDULE TCS

## 2017-02-01 NOTE — Telephone Encounter (Signed)
Pt is on road. Will call back between 1:30-4:30 pm today.

## 2017-02-03 ENCOUNTER — Ambulatory Visit (INDEPENDENT_AMBULATORY_CARE_PROVIDER_SITE_OTHER): Payer: Medicare Other | Admitting: Orthopedic Surgery

## 2017-02-03 DIAGNOSIS — Z89432 Acquired absence of left foot: Secondary | ICD-10-CM

## 2017-02-03 NOTE — Progress Notes (Signed)
Office Visit Note   Patient: Albert [name]           Date of Birth: Aug 22, 1961           MRN: [id number] Visit Date: 02/03/2017              Requested by: Celene Squibb, MD [address] [place], [place] [address] PCP: Wende Neighbors, MD  No chief complaint on file.     HPI: 56 year old gentleman who presents today for evaluation of ulceration beneath his left transmetatarsal amputation. Has been doing daily dry dressing changes with Kerlix. Today presents full weightbearing in a extra-depth shoe.  Assessment & Plan: Visit Diagnoses:  1. History of transmetatarsal amputation of left foot ([place])     Plan: Him more months with reevaluation of his left foot. Continue with daily wound care. Recommended antibacterial ointment dressing changes daily.  Follow-Up Instructions: Return in about 2 months (around 04/05/2017).   Ortho Exam  Patient is alert, oriented, no adenopathy, well-dressed, normal affect, normal respiratory effort. Laminations left transmetatarsal amputation is well-healed. There is a plantar ulceration underneath the amputation this is 2 cm in diameter with central ulceration 4 mm in diameter and 3 mm deep. There is no purulence no erythema no maceration no odor there what is hypertrophic callus this was part with 10 blade knife back to viable tissue.  Imaging: No results found.  Labs: Lab Results  Component Value Date   HGBA1C 9.1 (H) 02/08/2016   HGBA1C 8.2 (H) 05/16/2015   HGBA1C 8.2 (H) 06/25/2013   REPTSTATUS 02/13/2016 FINAL 02/08/2016   CULT NO GROWTH 5 DAYS 02/08/2016   LABORGA STAPHYLOCOCCUS AUREUS 06/26/2013    Orders:  No orders of the defined types were placed in this encounter.  No orders of the defined types were placed in this encounter.    Procedures: No procedures performed  Clinical Data: No additional findings.  ROS:  All other systems negative, except as noted in the HPI. Review of Systems  Constitutional: Negative for chills  and fever.  Cardiovascular: Negative for leg swelling.  Skin: Positive for wound. Negative for color change.    Objective: Vital Signs: There were no vitals taken for this visit.  Specialty Comments:  No specialty comments available.  PMFS History: Patient Active Problem List   Diagnosis Date Noted  . Midfoot ulcer, left, limited to breakdown of skin ([place]) 11/18/2016  . ARF (acute renal failure) ([place]) 02/08/2016  . Chronic combined systolic and diastolic CHF (congestive heart failure) ([place]) 02/08/2016  . Splenic infarct 01/06/2014  . Acute systolic CHF (congestive heart failure) ([place]) 08/03/2013  . Hx of right BKA ([place]) 07/26/2013  . Anemia 07/15/2013  . Hyperkalemia 07/15/2013  . Hyposmolality and/or hyponatremia 07/15/2013  . Acute respiratory failure ([place]) 07/15/2013  . Mitral regurgitation 07/15/2013  . Oliguria 07/15/2013  . Bacteremia due to Staphylococcus aureus 07/15/2013  . Hyperchloremia 07/15/2013  . Acute kidney failure ([place]) 07/14/2013  . Hypotension, unspecified 07/14/2013  . Acute systolic heart failure ([place]) 07/12/2013  . Acute pulmonary edema ([place]) 07/11/2013  . Pneumonia, organism unspecified(486) 07/11/2013  . Hypoxemia 07/11/2013  . Cardiomyopathy, ischemic 07/03/2013  . Coronary atherosclerosis of native coronary artery 07/01/2013  . NSTEMI (non-ST elevated myocardial infarction) ([place]) 06/26/2013  . AKI (acute kidney injury) ([place]) 06/25/2013  . Foot abscess, right 06/25/2013  . Shock circulatory ([place]) 06/25/2013  . Dehydration with hyponatremia 06/25/2013  . Leukocytosis, unspecified 06/25/2013  . Sepsis(995.91) 06/25/2013  . Aftercare following surgery of  the circulatory system, [place] 02/11/2013  . Peripheral vascular disease, unspecified ([place]) 12/31/2012  . Diabetes mellitus out of control ([place]) 12/17/2012  . Essential hypertension, benign 12/17/2012  . Atherosclerotic peripheral vascular disease ([place]) 12/17/2012  . Hypercholesteremia    Past  Medical History:  Diagnosis Date  . Allergic rhinitis   . Anemia    takes supplement  . Chronic cough   . Chronic sinusitis   . Complication of anesthesia    couldn't swallow and talk  . CVA (cerebral infarction) 2011   R hand deficit  . Diabetes mellitus without complication ([place]) ~7026   last HbA1c ~9 .... fasting 160s  . GERD (gastroesophageal reflux disease)   . Hypercholesteremia   . Hypertension   . Myocardial infarction   . Osteomyelitis ([place])    first and third metatarsal  . Pneumonia    hx of walking  . Splenic infarct    setting of lupus anticoagulant  . Stroke [facility])    right arm weakness    Family History  Problem Relation Age of Onset  . Diabetes    . Pancreatic cancer Mother   . Diabetes Mother   . Hyperlipidemia Mother   . Breast cancer Sister   . Depression Maternal Grandmother   . Heart disease Maternal Grandmother   . Depression Maternal Grandfather   . Heart disease Maternal Grandfather   . Depression Paternal Grandmother   . Heart disease Paternal Grandmother   . Depression Paternal Grandfather   . Heart disease Paternal Grandfather     Past Surgical History:  Procedure Laterality Date  . 4th Toe Amputation Left Jan. 2014   4th toe in [place]  . AMPUTATION Left 12/08/2012   Procedure: AMPUTATION DIGIT;  Surgeon: Mcarthur Rossetti, MD;  Location: [place];  Service: Orthopedics;  Laterality: Left;  3rd toe amputation possible 5th toe amputation  . AMPUTATION Left 12/11/2012   Procedure: Amputation of 2nd Toe;  Surgeon: Mcarthur Rossetti, MD;  Location: [place];  Service: Orthopedics;  Laterality: Left;  . AMPUTATION Right 06/29/2013   Procedure: AMPUTATION BELOW KNEE;  Surgeon: Newt Minion, MD;  Location: [place];  Service: Orthopedics;  Laterality: Right;  . AMPUTATION Left 08/29/2014   Procedure: Left Great Toe Amputation at MTP;  Surgeon: Newt Minion, MD;  Location: [place];  Service: Orthopedics;  Laterality: Left;  . AMPUTATION Left  10/31/2014   Procedure: Midfoot Amputation;  Surgeon: Newt Minion, MD;  Location: [facility];  Service: Orthopedics;  Laterality: Left;  . BYPASS GRAFT POPLITEAL TO TIBIAL Left 12/13/2012   Procedure: BYPASS GRAFT POPLITEAL TO TIBIAL;  Surgeon: Serafina Mitchell, MD;  [facility];  Service: Vascular;  Laterality: Left;  Left Popliteal to Posterior Tibial Bypass Graft with reversed saphenous vein graft  . CARDIAC CATHETERIZATION    . I&D EXTREMITY Left 12/08/2012   Procedure: IRRIGATION AND DEBRIDEMENT EXTREMITY;  Surgeon: Mcarthur Rossetti, MD;  Location: [place];  Service: Orthopedics;  Laterality: Left;  . I&D EXTREMITY Left 12/11/2012   Procedure: REPEAT I&D LEFT FOOT;  Surgeon: Mcarthur Rossetti, MD;  Location: [place];  Service: Orthopedics;  Laterality: Left;  . INCISION AND DRAINAGE Right 06/25/2013   Procedure: INCISION AND DRAINAGE RIGHT FOOT;  Surgeon: Mcarthur Rossetti, MD;  [facility];  Service: Orthopedics;  Laterality: Right;  . LEFT HEART CATHETERIZATION WITH CORONARY ANGIOGRAM [name] 06/28/2013   Procedure: LEFT HEART CATHETERIZATION WITH CORONARY ANGIOGRAM;  Surgeon: Burnell Blanks, MD;  Location:  [place] [facility]ATH LAB;  Service: Cardiovascular;  Laterality: [name];  . PENILE PROSTHESIS IMPLANT    . STUMP REVISION Right 08/16/2013   Procedure: STUMP REVISION- right Below Knee Amputation;  Surgeon: Newt Minion, MD;  Location: [place];  Service: Orthopedics;  Laterality: Right;  . TEE WITHOUT CARDIOVERSION [name] 07/03/2013   Procedure: TRANSESOPHAGEAL ECHOCARDIOGRAM (TEE);  Surgeon: Larey Dresser, MD;  Location: [place];  Service: Cardiovascular;  Laterality: [name];  . TONSILLECTOMY AND ADENOIDECTOMY     Social History   Occupational History  . 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 Smoking status: Never Smoker  . Smokeless tobacco: Never Used  . Alcohol use No  . Drug use: No  . Sexual activity: No

## 2017-02-07 ENCOUNTER — Telehealth: Payer: Self-pay | Admitting: *Deleted

## 2017-02-07 NOTE — Telephone Encounter (Signed)
-----   Message from Arnoldo Lenis, MD sent at 02/06/2017  3:02 PM EDT ----- Echo shows heart function has improved, now in the low normal range  Zandra Abts MD

## 2017-02-07 NOTE — Telephone Encounter (Signed)
forward

## 2017-02-07 NOTE — Telephone Encounter (Signed)
Ok to schedule. Have him take half nis normal dose of insulin (both Humalog and Levamir) the night before and none the morning of.

## 2017-02-07 NOTE — Telephone Encounter (Signed)
Pt aware - routed to pcp  

## 2017-02-07 NOTE — Telephone Encounter (Signed)
Gastroenterology Pre-Procedure Review  Request Date: Requesting Physician:   PATIENT REVIEW QUESTIONS: The patient responded to the following health history questions as indicated:    1. Diabetes Melitis: YES 2. Joint replacements in the past 12 months: NO 3. Major health problems in the past 3 months: NO 4. Has an artificial valve or MVP: NO 5. Has a defibrillator: NO 6. Has been advised in past to take antibiotics in advance of a procedure like teeth cleaning:NO 7. Family history of colon cancer: NO 8. Alcohol Use: NO 9. History of sleep apnea: NO 10. History of coronary artery or other vascular stents placed within the last 12 months: NO    MEDICATIONS & ALLERGIES:    Patient reports the following regarding taking any blood thinners:   Plavix? NO Aspirin? YES Coumadin? NO Brilinta? NO Xarelto? NO Eliquis? NO Pradaxa? NO Savaysa? NO Effient? NO  Patient confirms/reports the following medications:  Current Outpatient Prescriptions  Medication Sig Dispense Refill  . aspirin EC 81 MG tablet Take 1 tablet (81 mg total) by mouth daily. 30 tablet 1  . atorvastatin (LIPITOR) 80 MG tablet Take 1 tablet (80 mg total) by mouth daily. 30 tablet 6  . carvedilol (COREG) 25 MG tablet Take 25 mg by mouth 2 (two) times daily with a meal.     . furosemide (LASIX) 80 MG tablet Take 80 mg by mouth daily.    Marland Kitchen HUMALOG KWIKPEN 100 UNIT/ML KiwkPen Inject 22 Units into the skin 3 (three) times daily before meals.     . hydrALAZINE (APRESOLINE) 10 MG tablet Take 1 tablet (10 mg total) by mouth 3 (three) times daily. 120 tablet 1  . LEVEMIR FLEXTOUCH 100 UNIT/ML Pen Inject 52 Units into the skin daily at 10 pm.   5  . losartan (COZAAR) 50 MG tablet TAKE 1 TABLET BY MOUTH  DAILY 90 tablet 0  . nitroGLYCERIN (NITRODUR - DOSED IN MG/24 HR) 0.2 mg/hr patch Place 0.2 mg onto the skin every 3 (three) days. Applies to foot/ankle area to increase blood flow    . pantoprazole (PROTONIX) 40 MG tablet Take 1  tablet (40 mg total) by mouth daily. 30 tablet 1  . potassium chloride (K-DUR,KLOR-CON) 10 MEQ tablet Take 1 tablet (10 mEq total) by mouth daily. 30 tablet 6  . senna-docusate (SENOKOT-S) 8.6-50 MG per tablet Take 1 tablet by mouth daily as needed for mild constipation.     . Loratadine-Pseudoephedrine (CLARITIN-D 12 HOUR PO) Take 1 tablet by mouth daily as needed (allergies).     . polyethylene glycol (MIRALAX / GLYCOLAX) packet Take 17 g by mouth daily as needed for mild constipation.     . promethazine (PHENERGAN) 25 MG tablet Take 1 tab every 6-8 hours for nausea. (Patient not taking: Reported on 02/07/2017) 40 tablet 1   No current facility-administered medications for this visit.     Patient confirms/reports the following allergies:  Allergies  Allergen Reactions  . Amoxicillin Nausea And Vomiting    No orders of the defined types were placed in this encounter.   AUTHORIZATION INFORMATION Primary Insurance: [place] ,  [place] #[id number]: ,  Group #: [id number] Pre-Cert / Josem Kaufmann required:  Pre-Cert / Auth #:    SCHEDULE INFORMATION: Procedure has been scheduled as follows:  Date: , Time:   Location:   This Gastroenterology Pre-Precedure Review Form is being routed to the following provider(s):  Dr.FIELDS

## 2017-02-08 ENCOUNTER — Other Ambulatory Visit: Payer: Self-pay

## 2017-02-08 DIAGNOSIS — Z1211 Encounter for screening for malignant neoplasm of colon: Secondary | ICD-10-CM

## 2017-02-08 MED ORDER — PEG 3350-KCL-NA BICARB-NACL 420 G PO SOLR: 4000.0000 mL | ORAL | 0 refills | Status: DC

## 2017-02-08 NOTE — Telephone Encounter (Signed)
NO PA is needed for TCS 

## 2017-02-08 NOTE — Telephone Encounter (Signed)
Pt is set up for 04/14/17 @ 10:00 am. He is aware and instructions are going out in the mail.

## 2017-02-16 ENCOUNTER — Ambulatory Visit (INDEPENDENT_AMBULATORY_CARE_PROVIDER_SITE_OTHER): Payer: Medicare Other | Admitting: Orthopedic Surgery

## 2017-02-21 ENCOUNTER — Other Ambulatory Visit: Payer: Self-pay

## 2017-02-21 MED ORDER — PEG 3350-KCL-NA BICARB-NACL 420 G PO SOLR: 4000.0000 mL | ORAL | 0 refills | Status: DC

## 2017-02-27 ENCOUNTER — Other Ambulatory Visit: Payer: Self-pay | Admitting: Cardiology

## 2017-03-22 ENCOUNTER — Ambulatory Visit (INDEPENDENT_AMBULATORY_CARE_PROVIDER_SITE_OTHER): Payer: Medicare Other | Admitting: Family

## 2017-03-22 DIAGNOSIS — Z89432 Acquired absence of left foot: Secondary | ICD-10-CM

## 2017-03-22 DIAGNOSIS — L97421 Non-pressure chronic ulcer of left heel and midfoot limited to breakdown of skin: Secondary | ICD-10-CM | POA: Diagnosis not present

## 2017-03-22 MED ORDER — MUPIROCIN 2 % EX OINT: 1.0000 "application " | TOPICAL_OINTMENT | Freq: Two times a day (BID) | CUTANEOUS | 6 refills | Status: DC

## 2017-03-22 MED ORDER — DOXYCYCLINE HYCLATE 100 MG PO TABS: 100.0000 mg | ORAL_TABLET | Freq: Two times a day (BID) | ORAL | 0 refills | Status: DC

## 2017-03-22 MED ORDER — MUPIROCIN 2 % EX OINT: 1.0000 "application " | TOPICAL_OINTMENT | Freq: Every day | CUTANEOUS | 6 refills | Status: DC

## 2017-03-22 NOTE — Progress Notes (Signed)
Office Visit Note   Patient: Albert [name]           Date of Birth: 1961/06/03           MRN: [id number] Visit Date: 03/22/2017              Requested by: Celene Squibb, MD [address] [place], [place] [address] PCP: Celene Squibb, MD  No chief complaint on file.     HPI: The patient is a 56 year old gentleman who presents today for evaluation of callused ulceration beneath his left transmetatarsal amputation. Has been full weightbearing in a extra-depth shoe. Has not been doing wound care. Wonders what he can do to prevent build up of callus in future.   Assessment & Plan: Visit Diagnoses:  1. Midfoot ulcer, left, limited to breakdown of skin ([place])   2. History of transmetatarsal amputation of left foot ([place])     Plan:  Begin daily dial soap cleansing. Recommended antibacterial ointment dressing changes daily. Have also called in a prescription for Doxycycline. Will have him follow up in 2 weeks. Provided a felt pressure relieving donut for shoe wear. Minimize weight bearing.   Follow-Up Instructions: Return in about 2 weeks (around 04/05/2017).   Ortho Exam  Patient is alert, oriented, no adenopathy, well-dressed, normal affect, normal respiratory effort. Laminations left transmetatarsal amputation is well-healed. There is a plantar ulceration underneath the amputation beneath 4th distal MT. this is 2 cm in diameter with central ulceration 5 mm in diameter and 1 mm deep. There is no purulence no erythema no maceration no odor there is hypertrophic callus this was pared with 10 blade knife back to viable tissue. There is no drainage today.   Imaging: No results found.  Labs: Lab Results  Component Value Date   HGBA1C 9.1 (H) 02/08/2016   HGBA1C 8.2 (H) 05/16/2015   HGBA1C 8.2 (H) 06/25/2013   REPTSTATUS 02/13/2016 FINAL 02/08/2016   CULT NO GROWTH 5 DAYS 02/08/2016   LABORGA STAPHYLOCOCCUS AUREUS 06/26/2013    Orders:  No orders of the defined types were placed  in this encounter.  Meds ordered this encounter  Medications  . DISCONTD: mupirocin ointment (BACTROBAN) 2 %    Sig: Apply 1 application topically 2 (two) times daily.    Dispense:  22 g    Refill:  6  . DISCONTD: doxycycline (VIBRA-TABS) 100 MG tablet    Sig: Take 1 tablet (100 mg total) by mouth 2 (two) times daily.    Dispense:  28 tablet    Refill:  0  . mupirocin ointment (BACTROBAN) 2 %    Sig: Apply 1 application topically daily.    Dispense:  22 g    Refill:  6  . doxycycline (VIBRA-TABS) 100 MG tablet    Sig: Take 1 tablet (100 mg total) by mouth 2 (two) times daily.    Dispense:  28 tablet    Refill:  0     Procedures: No procedures performed  Clinical Data: No additional findings.  ROS:  All other systems negative, except as noted in the HPI. Review of Systems  Constitutional: Negative for chills and fever.  Cardiovascular: Negative for leg swelling.  Skin: Positive for wound. Negative for color change.    Objective: Vital Signs: There were no vitals taken for this visit.  Specialty Comments:  No specialty comments available.  PMFS History: Patient Active Problem List   Diagnosis Date Noted  . Midfoot ulcer, left, limited to breakdown of  skin ([place]) 11/18/2016  . ARF (acute renal failure) ([place]) 02/08/2016  . Chronic combined systolic and diastolic CHF (congestive heart failure) ([place]) 02/08/2016  . Splenic infarct 01/06/2014  . Acute systolic CHF (congestive heart failure) ([place]) 08/03/2013  . Hx of right BKA ([place]) 07/26/2013  . Anemia 07/15/2013  . Hyperkalemia 07/15/2013  . Hyposmolality and/or hyponatremia 07/15/2013  . Acute respiratory failure ([place]) 07/15/2013  . Mitral regurgitation 07/15/2013  . Oliguria 07/15/2013  . Bacteremia due to Staphylococcus aureus 07/15/2013  . Hyperchloremia 07/15/2013  . Acute kidney failure ([place]) 07/14/2013  . Hypotension, unspecified 07/14/2013  . Acute systolic heart failure ([place]) 07/12/2013  . Acute  pulmonary edema ([place]) 07/11/2013  . Pneumonia, organism unspecified(486) 07/11/2013  . Hypoxemia 07/11/2013  . Cardiomyopathy, ischemic 07/03/2013  . Coronary atherosclerosis of native coronary artery 07/01/2013  . NSTEMI (non-ST elevated myocardial infarction) ([place]) 06/26/2013  . AKI (acute kidney injury) ([place]) 06/25/2013  . Shock circulatory ([place]) 06/25/2013  . Dehydration with hyponatremia 06/25/2013  . Leukocytosis, unspecified 06/25/2013  . Sepsis(995.91) 06/25/2013  . Aftercare following surgery of the circulatory system, [place] 02/11/2013  . Peripheral vascular disease, unspecified ([place]) 12/31/2012  . Diabetes mellitus out of control ([place]) 12/17/2012  . Essential hypertension, benign 12/17/2012  . Atherosclerotic peripheral vascular disease ([place]) 12/17/2012  . Hypercholesteremia    Past Medical History:  Diagnosis Date  . Allergic rhinitis   . Anemia    takes supplement  . Chronic cough   . Chronic sinusitis   . Complication of anesthesia    couldn't swallow and talk  . CVA (cerebral infarction) 2011   R hand deficit  . Diabetes mellitus without complication ([place]) ~9518   last HbA1c ~9 .... fasting 160s  . GERD (gastroesophageal reflux disease)   . Hypercholesteremia   . Hypertension   . Myocardial infarction   . Osteomyelitis ([place])    first and third metatarsal  . Pneumonia    hx of walking  . Splenic infarct    setting of lupus anticoagulant  . Stroke [facility])    right arm weakness    Family History  Problem Relation Age of Onset  . Diabetes Unknown   . Pancreatic cancer Mother   . Diabetes Mother   . Hyperlipidemia Mother   . Breast cancer Sister   . Depression Maternal Grandmother   . Heart disease Maternal Grandmother   . Depression Maternal Grandfather   . Heart disease Maternal Grandfather   . Depression Paternal Grandmother   . Heart disease Paternal Grandmother   . Depression Paternal Grandfather   . Heart disease Paternal Grandfather     Past  Surgical History:  Procedure Laterality Date  . 4th Toe Amputation Left Jan. 2014   4th toe in [place]  . AMPUTATION Left 12/08/2012   Procedure: AMPUTATION DIGIT;  Surgeon: Mcarthur Rossetti, MD;  Location: [place];  Service: Orthopedics;  Laterality: Left;  3rd toe amputation possible 5th toe amputation  . AMPUTATION Left 12/11/2012   Procedure: Amputation of 2nd Toe;  Surgeon: Mcarthur Rossetti, MD;  Location: [place];  Service: Orthopedics;  Laterality: Left;  . AMPUTATION Right 06/29/2013   Procedure: AMPUTATION BELOW KNEE;  Surgeon: Newt Minion, MD;  Location: [place];  Service: Orthopedics;  Laterality: Right;  . AMPUTATION Left 08/29/2014   Procedure: Left Great Toe Amputation at MTP;  Surgeon: Newt Minion, MD;  Location: [place];  Service: Orthopedics;  Laterality: Left;  . AMPUTATION Left 10/31/2014   Procedure: Midfoot Amputation;  Surgeon: Newt Minion, MD;  Location: [place];  Service: Orthopedics;  Laterality: Left;  . BYPASS GRAFT POPLITEAL TO TIBIAL Left 12/13/2012   Procedure: BYPASS GRAFT POPLITEAL TO TIBIAL;  Surgeon: Serafina Mitchell, MD;  [facility];  Service: Vascular;  Laterality: Left;  Left Popliteal to Posterior Tibial Bypass Graft with reversed saphenous vein graft  . CARDIAC CATHETERIZATION    . I&D EXTREMITY Left 12/08/2012   Procedure: IRRIGATION AND DEBRIDEMENT EXTREMITY;  Surgeon: Mcarthur Rossetti, MD;  Location: [place];  Service: Orthopedics;  Laterality: Left;  . I&D EXTREMITY Left 12/11/2012   Procedure: REPEAT I&D LEFT FOOT;  Surgeon: Mcarthur Rossetti, MD;  Location: [place];  Service: Orthopedics;  Laterality: Left;  . INCISION AND DRAINAGE Right 06/25/2013   Procedure: INCISION AND DRAINAGE RIGHT FOOT;  Surgeon: Mcarthur Rossetti, MD;  [facility];  Service: Orthopedics;  Laterality: Right;  . LEFT HEART CATHETERIZATION WITH CORONARY ANGIOGRAM [name] 06/28/2013   Procedure: LEFT HEART CATHETERIZATION WITH CORONARY ANGIOGRAM;  Surgeon:  Burnell Blanks, MD;  [facility] C[facility];  Service: Cardiovascular;  Laterality: [name];  . PENILE PROSTHESIS IMPLANT    . STUMP REVISION Right 08/16/2013   Procedure: STUMP REVISION- right Below Knee Amputation;  Surgeon: Newt Minion, MD;  Location: [place];  Service: Orthopedics;  Laterality: Right;  . TEE WITHOUT CARDIOVERSION [name] 07/03/2013   Procedure: TRANSESOPHAGEAL ECHOCARDIOGRAM (TEE);  Surgeon: Larey Dresser, MD;  Location: [place];  Service: Cardiovascular;  Laterality: [name];  . TONSILLECTOMY AND ADENOIDECTOMY     Social History   Occupational History  . 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 Smoking status: Never Smoker  . Smokeless tobacco: Never Used  . Alcohol use No  . Drug use: No  . Sexual activity: No

## 2017-04-03 ENCOUNTER — Ambulatory Visit (INDEPENDENT_AMBULATORY_CARE_PROVIDER_SITE_OTHER): Payer: Medicare Other | Admitting: Orthopedic Surgery

## 2017-04-03 ENCOUNTER — Telehealth: Payer: Self-pay

## 2017-04-03 ENCOUNTER — Encounter (INDEPENDENT_AMBULATORY_CARE_PROVIDER_SITE_OTHER): Payer: Self-pay | Admitting: Orthopedic Surgery

## 2017-04-03 VITALS — Ht 71.0 in | Wt 264.0 lb

## 2017-04-03 DIAGNOSIS — Z89432 Acquired absence of left foot: Secondary | ICD-10-CM

## 2017-04-03 DIAGNOSIS — L97421 Non-pressure chronic ulcer of left heel and midfoot limited to breakdown of skin: Secondary | ICD-10-CM | POA: Diagnosis not present

## 2017-04-03 NOTE — Telephone Encounter (Signed)
Pt called with questions about his prep. He said his instructions were for Trilyte and his bottle says Gavilyte. I told him they are the same. He asked about his diabetic meds and I told him we were aware and his instructions are on his sheet for diabetic meds. He is aware also that we do not hold ASA for screening colonoscopies.

## 2017-04-03 NOTE — Progress Notes (Signed)
Office Visit Note   Patient: Albert [name]           Date of Birth: 1961-06-09           MRN: [id number] Visit Date: 04/03/2017              Requested by: Celene Squibb, MD [address] [place], [place] [address] PCP: Celene Squibb, MD  Chief Complaint  Patient presents with  . Left Foot - Follow-up      HPI: Patient is a 56 year old gentleman diabetic insensate neuropathy transmetatarsal amputation the left who presents in follow-up for an ulcer. He has just about completed his doxycycline he is using a nitroglycerin patch to improve microcirculation he has extra-depth shoes with custom orthotics  Assessment & Plan: Visit Diagnoses:  1. Midfoot ulcer, left, limited to breakdown of skin ([place])   2. History of transmetatarsal amputation of left foot ([place])     Plan: Ulcer debridement of skin and soft tissue plan to continue protective shoe wear and continue with a Band-Aid and antibiotic ointment  Follow-Up Instructions: Return in about 4 weeks (around 05/01/2017).   Ortho Exam  Patient is alert, oriented, no adenopathy, well-dressed, normal affect, normal respiratory effort. Examination patient has an antalgic gait. He has a persistent Wagner grade 1 ulcer left transmetatarsal amputation. After informed consent a 10 blade knife was used to debride the skin and soft tissue back to healthy viable granulation tissue. The ulcer is 10 mm in diameter the granulation tissue was 5 mm in diameter 0.1 mm deep. This does not probe to bone or tendon no drainage no odor. Iodosorb was a Band-Aid applied.  Imaging: No results found.  Labs: Lab Results  Component Value Date   HGBA1C 9.1 (H) 02/08/2016   HGBA1C 8.2 (H) 05/16/2015   HGBA1C 8.2 (H) 06/25/2013   REPTSTATUS 02/13/2016 FINAL 02/08/2016   CULT NO GROWTH 5 DAYS 02/08/2016   LABORGA STAPHYLOCOCCUS AUREUS 06/26/2013    Orders:  No orders of the defined types were placed in this encounter.  No orders of the defined types  were placed in this encounter.    Procedures: No procedures performed  Clinical Data: No additional findings.  ROS:  All other systems negative, except as noted in the HPI. Review of Systems  Objective: Vital Signs: Ht 5\' 11"  (1.803 m)   Wt 264 lb (119.7 kg)   BMI 36.82 kg/m   Specialty Comments:  No specialty comments available.  PMFS History: Patient Active Problem List   Diagnosis Date Noted  . Midfoot ulcer, left, limited to breakdown of skin ([place]) 11/18/2016  . ARF (acute renal failure) ([place]) 02/08/2016  . Chronic combined systolic and diastolic CHF (congestive heart failure) ([place]) 02/08/2016  . Splenic infarct 01/06/2014  . Acute systolic CHF (congestive heart failure) ([place]) 08/03/2013  . Hx of right BKA ([place]) 07/26/2013  . Anemia 07/15/2013  . Hyperkalemia 07/15/2013  . Hyposmolality and/or hyponatremia 07/15/2013  . Acute respiratory failure ([place]) 07/15/2013  . Mitral regurgitation 07/15/2013  . Oliguria 07/15/2013  . Bacteremia due to Staphylococcus aureus 07/15/2013  . Hyperchloremia 07/15/2013  . Acute kidney failure ([place]) 07/14/2013  . Hypotension, unspecified 07/14/2013  . Acute systolic heart failure ([place]) 07/12/2013  . Acute pulmonary edema ([place]) 07/11/2013  . Pneumonia, organism unspecified(486) 07/11/2013  . Hypoxemia 07/11/2013  . Cardiomyopathy, ischemic 07/03/2013  . Coronary atherosclerosis of native coronary artery 07/01/2013  . NSTEMI (non-ST elevated myocardial infarction) ([place]) 06/26/2013  . AKI (acute kidney  injury) ([place]) 06/25/2013  . Shock circulatory ([place]) 06/25/2013  . Dehydration with hyponatremia 06/25/2013  . Leukocytosis, unspecified 06/25/2013  . Sepsis(995.91) 06/25/2013  . Aftercare following surgery of the circulatory system, [place] 02/11/2013  . Peripheral vascular disease, unspecified ([place]) 12/31/2012  . Diabetes mellitus out of control ([place]) 12/17/2012  . Essential hypertension, benign 12/17/2012  . Atherosclerotic  peripheral vascular disease ([place]) 12/17/2012  . Hypercholesteremia    Past Medical History:  Diagnosis Date  . Allergic rhinitis   . Anemia    takes supplement  . Chronic cough   . Chronic sinusitis   . Complication of anesthesia    couldn't swallow and talk  . CVA (cerebral infarction) 2011   R hand deficit  . Diabetes mellitus without complication ([place]) ~1829   last HbA1c ~9 .... fasting 160s  . GERD (gastroesophageal reflux disease)   . Hypercholesteremia   . Hypertension   . Myocardial infarction ([place])   . Osteomyelitis ([place])    first and third metatarsal  . Pneumonia    hx of walking  . Splenic infarct    setting of lupus anticoagulant  . Stroke [facility])    right arm weakness    Family History  Problem Relation Age of Onset  . Diabetes Unknown   . Pancreatic cancer Mother   . Diabetes Mother   . Hyperlipidemia Mother   . Breast cancer Sister   . Depression Maternal Grandmother   . Heart disease Maternal Grandmother   . Depression Maternal Grandfather   . Heart disease Maternal Grandfather   . Depression Paternal Grandmother   . Heart disease Paternal Grandmother   . Depression Paternal Grandfather   . Heart disease Paternal Grandfather     Past Surgical History:  Procedure Laterality Date  . 4th Toe Amputation Left Jan. 2014   4th toe in [place]  . AMPUTATION Left 12/08/2012   Procedure: AMPUTATION DIGIT;  Surgeon: Mcarthur Rossetti, MD;  Location: [place];  Service: Orthopedics;  Laterality: Left;  3rd toe amputation possible 5th toe amputation  . AMPUTATION Left 12/11/2012   Procedure: Amputation of 2nd Toe;  Surgeon: Mcarthur Rossetti, MD;  Location: [place];  Service: Orthopedics;  Laterality: Left;  . AMPUTATION Right 06/29/2013   Procedure: AMPUTATION BELOW KNEE;  Surgeon: Newt Minion, MD;  Location: [place];  Service: Orthopedics;  Laterality: Right;  . AMPUTATION Left 08/29/2014   Procedure: Left Great Toe Amputation at MTP;  Surgeon: Newt Minion,  MD;  Location: [place];  Service: Orthopedics;  Laterality: Left;  . AMPUTATION Left 10/31/2014   Procedure: Midfoot Amputation;  Surgeon: Newt Minion, MD;  Location: [place];  Service: Orthopedics;  Laterality: Left;  . BYPASS GRAFT POPLITEAL TO TIBIAL Left 12/13/2012   Procedure: BYPASS GRAFT POPLITEAL TO TIBIAL;  Surgeon: Serafina Mitchell, MD;  [facility];  Service: Vascular;  Laterality: Left;  Left Popliteal to Posterior Tibial Bypass Graft with reversed saphenous vein graft  . CARDIAC CATHETERIZATION    . I&D EXTREMITY Left 12/08/2012   Procedure: IRRIGATION AND DEBRIDEMENT EXTREMITY;  Surgeon: Mcarthur Rossetti, MD;  Location: [place];  Service: Orthopedics;  Laterality: Left;  . I&D EXTREMITY Left 12/11/2012   Procedure: REPEAT I&D LEFT FOOT;  Surgeon: Mcarthur Rossetti, MD;  Location: [place];  Service: Orthopedics;  Laterality: Left;  . INCISION AND DRAINAGE Right 06/25/2013   Procedure: INCISION AND DRAINAGE RIGHT FOOT;  Surgeon: Mcarthur Rossetti, MD;  [facility];  Service:  Orthopedics;  Laterality: Right;  . LEFT HEART CATHETERIZATION WITH CORONARY ANGIOGRAM [name] 06/28/2013   Procedure: LEFT HEART CATHETERIZATION WITH CORONARY ANGIOGRAM;  Surgeon: Burnell Blanks, MD;  Location[facility] LAB;  Service: Cardiovascular;  Laterality: [name];  . PENILE PROSTHESIS IMPLANT    . STUMP REVISION Right 08/16/2013   Procedure: STUMP REVISION- right Below Knee Amputation;  Surgeon: Newt Minion, MD;  Location: [place];  Service: Orthopedics;  Laterality: Right;  . TEE WITHOUT CARDIOVERSION [name] 07/03/2013   Procedure: TRANSESOPHAGEAL ECHOCARDIOGRAM (TEE);  Surgeon: Larey Dresser, MD;  Location: [place];  Service: Cardiovascular;  Laterality: [name];  . TONSILLECTOMY AND ADENOIDECTOMY     Social History   Occupational History  . 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 Smoking status: Never Smoker  . Smokeless tobacco:  Never Used  . Alcohol use No  . Drug use: No  . Sexual activity: No

## 2017-04-04 NOTE — Telephone Encounter (Signed)
Noted, no further recommendations at this time. 

## 2017-04-10 ENCOUNTER — Ambulatory Visit (INDEPENDENT_AMBULATORY_CARE_PROVIDER_SITE_OTHER): Payer: Medicare Other | Admitting: Family

## 2017-04-10 DIAGNOSIS — D509 Iron deficiency anemia, unspecified: Secondary | ICD-10-CM | POA: Diagnosis not present

## 2017-04-10 DIAGNOSIS — E1165 Type 2 diabetes mellitus with hyperglycemia: Secondary | ICD-10-CM | POA: Diagnosis not present

## 2017-04-12 DIAGNOSIS — I10 Essential (primary) hypertension: Secondary | ICD-10-CM | POA: Diagnosis not present

## 2017-04-12 DIAGNOSIS — I2510 Atherosclerotic heart disease of native coronary artery without angina pectoris: Secondary | ICD-10-CM | POA: Diagnosis not present

## 2017-04-12 DIAGNOSIS — E782 Mixed hyperlipidemia: Secondary | ICD-10-CM | POA: Diagnosis not present

## 2017-04-12 DIAGNOSIS — K219 Gastro-esophageal reflux disease without esophagitis: Secondary | ICD-10-CM | POA: Diagnosis not present

## 2017-04-12 DIAGNOSIS — E1165 Type 2 diabetes mellitus with hyperglycemia: Secondary | ICD-10-CM | POA: Diagnosis not present

## 2017-04-14 ENCOUNTER — Ambulatory Visit (HOSPITAL_COMMUNITY)
Admission: RE | Admit: 2017-04-14 | Discharge: 2017-04-14 | Disposition: A | Discharge status: Home / Self Care | Payer: Medicare Other | Source: Ambulatory Visit | Attending: Gastroenterology | Admitting: Gastroenterology

## 2017-04-14 ENCOUNTER — Encounter (HOSPITAL_COMMUNITY)
Admission: RE | Disposition: A | Discharge status: Home / Self Care | Payer: Self-pay | Source: Ambulatory Visit | Attending: Gastroenterology

## 2017-04-14 ENCOUNTER — Encounter (HOSPITAL_COMMUNITY): Payer: Self-pay | Admitting: *Deleted

## 2017-04-14 DIAGNOSIS — D122 Benign neoplasm of ascending colon: Secondary | ICD-10-CM | POA: Diagnosis not present

## 2017-04-14 DIAGNOSIS — Z79899 Other long term (current) drug therapy: Secondary | ICD-10-CM | POA: Diagnosis not present

## 2017-04-14 DIAGNOSIS — Z8673 Personal history of transient ischemic attack (TIA), and cerebral infarction without residual deficits: Secondary | ICD-10-CM | POA: Diagnosis not present

## 2017-04-14 DIAGNOSIS — Z1212 Encounter for screening for malignant neoplasm of rectum: Secondary | ICD-10-CM

## 2017-04-14 DIAGNOSIS — K648 Other hemorrhoids: Secondary | ICD-10-CM | POA: Diagnosis not present

## 2017-04-14 DIAGNOSIS — E119 Type 2 diabetes mellitus without complications: Secondary | ICD-10-CM | POA: Insufficient documentation

## 2017-04-14 DIAGNOSIS — K219 Gastro-esophageal reflux disease without esophagitis: Secondary | ICD-10-CM | POA: Diagnosis not present

## 2017-04-14 DIAGNOSIS — I10 Essential (primary) hypertension: Secondary | ICD-10-CM | POA: Insufficient documentation

## 2017-04-14 DIAGNOSIS — K644 Residual hemorrhoidal skin tags: Secondary | ICD-10-CM | POA: Insufficient documentation

## 2017-04-14 DIAGNOSIS — E7800 Pure hypercholesterolemia, unspecified: Secondary | ICD-10-CM | POA: Insufficient documentation

## 2017-04-14 DIAGNOSIS — Z7982 Long term (current) use of aspirin: Secondary | ICD-10-CM | POA: Insufficient documentation

## 2017-04-14 DIAGNOSIS — Z794 Long term (current) use of insulin: Secondary | ICD-10-CM | POA: Diagnosis not present

## 2017-04-14 DIAGNOSIS — Z1211 Encounter for screening for malignant neoplasm of colon: Secondary | ICD-10-CM

## 2017-04-14 DIAGNOSIS — I2510 Atherosclerotic heart disease of native coronary artery without angina pectoris: Secondary | ICD-10-CM | POA: Insufficient documentation

## 2017-04-14 DIAGNOSIS — I252 Old myocardial infarction: Secondary | ICD-10-CM | POA: Diagnosis not present

## 2017-04-14 HISTORY — DX: Atherosclerotic heart disease of native coronary artery without angina pectoris: I25.10

## 2017-04-14 HISTORY — PX: COLONOSCOPY: SHX5424

## 2017-04-14 HISTORY — PX: POLYPECTOMY: SHX5525

## 2017-04-14 LAB — GLUCOSE, CAPILLARY: GLUCOSE-CAPILLARY: 104 mg/dL — AB (ref 65–99)

## 2017-04-14 SURGERY — COLONOSCOPY
Anesthesia: Moderate Sedation

## 2017-04-14 MED ORDER — MEPERIDINE HCL 100 MG/ML IJ SOLN
INTRAMUSCULAR | Status: DC | PRN
  Administered 2017-04-14 (×4): 25 mg via INTRAVENOUS

## 2017-04-14 MED ORDER — MIDAZOLAM HCL 5 MG/5ML IJ SOLN
INTRAMUSCULAR | Status: DC | PRN
  Administered 2017-04-14: 2 mg via INTRAVENOUS
  Administered 2017-04-14: 1 mg via INTRAVENOUS
  Administered 2017-04-14: 2 mg via INTRAVENOUS

## 2017-04-14 MED ORDER — MEPERIDINE HCL 100 MG/ML IJ SOLN
INTRAMUSCULAR | Status: AC
  Filled 2017-04-14: qty 2

## 2017-04-14 MED ORDER — SODIUM CHLORIDE 0.9 % IV SOLN: INTRAVENOUS | Status: DC

## 2017-04-14 MED ORDER — STERILE WATER FOR IRRIGATION IR SOLN
Status: DC | PRN
  Administered 2017-04-14: 11:00:00

## 2017-04-14 MED ORDER — MIDAZOLAM HCL 5 MG/5ML IJ SOLN
INTRAMUSCULAR | Status: AC
  Filled 2017-04-14: qty 10

## 2017-04-14 NOTE — Discharge Instructions (Signed)
You had 1 polyp removed. You have internal hemorrhoids.   CONTINUE YOUR WEIGHT LOSS EFFORTS. LOSE TEN POUNDS.  DRINK WATER TO KEEP YOUR URINE LIGHT YELLOW.  FOLLOW A HIGH FIBER DIET. AVOID ITEMS THAT CAUSE BLOATING & GAS. SEE INFO BELOW.  YOUR BIOPSY RESULTS WILL BE AVAILABLE IN MY CHART AFTER JUL 4 AND MY OFFICE WILL CONTACT YOU IN 10-14 DAYS WITH YOUR RESULTS.   Next colonoscopy in 5-10 years.    Colonoscopy Care After Read the instructions outlined below and refer to this sheet in the next week. These discharge instructions provide you with general information on caring for yourself after you leave the hospital. While your treatment has been planned according to the most current medical practices available, unavoidable complications occasionally occur. If you have any problems or questions after discharge, call DR. Saran Laviolette, [phone].  ACTIVITY  You may resume your regular activity, but move at a slower pace for the next 24 hours.   Take frequent rest periods for the next 24 hours.   Walking will help get rid of the air and reduce the bloated feeling in your belly (abdomen).   No driving for 24 hours (because of the medicine (anesthesia) used during the test).   You may shower.   Do not sign any important legal documents or operate any machinery for 24 hours (because of the anesthesia used during the test).    NUTRITION  Drink plenty of fluids.   You may resume your normal diet as instructed by your doctor.   Begin with a light meal and progress to your normal diet. Heavy or fried foods are harder to digest and may make you feel sick to your stomach (nauseated).   Avoid alcoholic beverages for 24 hours or as instructed.    MEDICATIONS  You may resume your normal medications.   WHAT YOU CAN EXPECT TODAY  Some feelings of bloating in the abdomen.   Passage of more gas than usual.   Spotting of blood in your stool or on the toilet paper  .  IF YOU HAD  POLYPS REMOVED DURING THE COLONOSCOPY:  Eat a soft diet IF YOU HAVE NAUSEA, BLOATING, ABDOMINAL PAIN, OR VOMITING.    FINDING OUT THE RESULTS OF YOUR TEST Not all test results are available during your visit. DR. Oneida Alar WILL CALL YOU WITHIN 14 DAYS OF YOUR PROCEDUE WITH YOUR RESULTS. Do not assume everything is normal if you have not heard from DR. Gertie Broerman, CALL HER OFFICE AT [phone].  SEEK IMMEDIATE MEDICAL ATTENTION AND CALL THE OFFICE: [phone] IF:  You have more than a spotting of blood in your stool.   Your belly is swollen (abdominal distention).   You are nauseated or vomiting.   You have a temperature over 101F.   You have abdominal pain or discomfort that is severe or gets worse throughout the day.   High-Fiber Diet A high-fiber diet changes your normal diet to include more whole grains, legumes, fruits, and vegetables. Changes in the diet involve replacing refined carbohydrates with unrefined foods. The calorie level of the diet is essentially unchanged. The Dietary Reference Intake (recommended amount) for adult males is 38 grams per day. For adult females, it is 25 grams per day. Pregnant and lactating women should consume 28 grams of fiber per day. Fiber is the intact part of a plant that is not broken down during digestion. Functional fiber is fiber that has been isolated from the plant to provide a beneficial effect in the body.  PURPOSE  Increase stool bulk.   Ease and regulate bowel movements.   Lower cholesterol.   REDUCE RISK OF COLON CANCER  INDICATIONS THAT YOU NEED MORE FIBER  Constipation and hemorrhoids.   Uncomplicated diverticulosis (intestine condition) and irritable bowel syndrome.   Weight management.   As a protective measure against hardening of the arteries (atherosclerosis), diabetes, and cancer.   GUIDELINES FOR INCREASING FIBER IN THE DIET  Start adding fiber to the diet slowly. A gradual increase of about 5 more grams (2 slices  of whole-wheat bread, 2 servings of most fruits or vegetables, or 1 bowl of high-fiber cereal) per day is best. Too rapid an increase in fiber may result in constipation, flatulence, and bloating.   Drink enough water and fluids to keep your urine clear or pale yellow. Water, juice, or caffeine-free drinks are recommended. Not drinking enough fluid may cause constipation.   Eat a variety of high-fiber foods rather than one type of fiber.   Try to increase your intake of fiber through using high-fiber foods rather than fiber pills or supplements that contain small amounts of fiber.   The goal is to change the types of food eaten. Do not supplement your present diet with high-fiber foods, but replace foods in your present diet.   INCLUDE A VARIETY OF FIBER SOURCES  Replace refined and processed grains with whole grains, canned fruits with fresh fruits, and incorporate other fiber sources. White rice, white breads, and most bakery goods contain little or no fiber.   Brown whole-grain rice, buckwheat oats, and many fruits and vegetables are all good sources of fiber. These include: broccoli, Brussels sprouts, cabbage, cauliflower, beets, sweet potatoes, white potatoes (skin on), carrots, tomatoes, eggplant, squash, berries, fresh fruits, and dried fruits.   Cereals appear to be the richest source of fiber. Cereal fiber is found in whole grains and bran. Bran is the fiber-rich outer coat of cereal grain, which is largely removed in refining. In whole-grain cereals, the bran remains. In breakfast cereals, the largest amount of fiber is found in those with "bran" in their names. The fiber content is sometimes indicated on the label.   You may need to include additional fruits and vegetables each day.   In baking, for 1 cup white flour, you may use the following substitutions:   1 cup whole-wheat flour minus 2 tablespoons.   1/2 cup white flour plus 1/2 cup whole-wheat flour.   Polyps, Colon  A  polyp is extra tissue that grows inside your body. Colon polyps grow in the large intestine. The large intestine, also called the colon, is part of your digestive system. It is a long, hollow tube at the end of your digestive tract where your body makes and stores stool. Most polyps are not dangerous. They are benign. This means they are not cancerous. But over time, some types of polyps can turn into cancer. Polyps that are smaller than a pea are usually not harmful. But larger polyps could someday become or may already be cancerous. To be safe, doctors remove all polyps and test them.   PREVENTION There is not one sure way to prevent polyps. You might be able to lower your risk of getting them if you:  Eat more fruits and vegetables and less fatty food.   Do not smoke.   Avoid alcohol.   Exercise every day.   Lose weight if you are overweight.   Eating more calcium and folate can also lower your risk of  getting polyps. Some foods that are rich in calcium are milk, cheese, and broccoli. Some foods that are rich in folate are chickpeas, kidney beans, and spinach.   Hemorrhoids Hemorrhoids are dilated (enlarged) veins around the rectum. Sometimes clots will form in the veins. This makes them swollen and painful. These are called thrombosed hemorrhoids. Causes of hemorrhoids include:  Constipation.   Straining to have a bowel movement.   HEAVY LIFTING  HOME CARE INSTRUCTIONS  Eat a well balanced diet and drink 6 to 8 glasses of water every day to avoid constipation. You may also use a bulk laxative.   Avoid straining to have bowel movements.   Keep anal area dry and clean.   Do not use a donut shaped pillow or sit on the toilet for long periods. This increases blood pooling and pain.   Move your bowels when your body has the urge; this will require less straining and will decrease pain and pressure.

## 2017-04-14 NOTE — H&P (Signed)
Primary Care Physician:  Celene Squibb, MD Primary Gastroenterologist:  Dr. Oneida Alar  Pre-Procedure History & Physical: HPI:  Albert [name] is a 56 y.o. male here for COLON CANCER SCREENING.  Past Medical History:  Diagnosis Date  . Allergic rhinitis   . Anemia    takes supplement  . Chronic cough   . Chronic sinusitis   . Complication of anesthesia    couldn't swallow and talk  . Coronary artery disease   . CVA (cerebral infarction) 2011   R hand deficit  . Diabetes mellitus without complication ([place]) ~6295   last HbA1c ~9 .... fasting 160s  . GERD (gastroesophageal reflux disease)   . Hypercholesteremia   . Hypertension   . Myocardial infarction ([place])   . Osteomyelitis ([place])    first and third metatarsal  . Pneumonia    hx of walking  . Splenic infarct    setting of lupus anticoagulant  . Stroke [facility])    right arm weakness    Past Surgical History:  Procedure Laterality Date  . 4th Toe Amputation Left Jan. 2014   4th toe in [place]  . AMPUTATION Left 12/08/2012   Procedure: AMPUTATION DIGIT;  Surgeon: Mcarthur Rossetti, MD;  Location: [place];  Service: Orthopedics;  Laterality: Left;  3rd toe amputation possible 5th toe amputation  . AMPUTATION Left 12/11/2012   Procedure: Amputation of 2nd Toe;  Surgeon: Mcarthur Rossetti, MD;  Location: [place];  Service: Orthopedics;  Laterality: Left;  . AMPUTATION Right 06/29/2013   Procedure: AMPUTATION BELOW KNEE;  Surgeon: Newt Minion, MD;  Location: [place];  Service: Orthopedics;  Laterality: Right;  . AMPUTATION Left 08/29/2014   Procedure: Left Great Toe Amputation at MTP;  Surgeon: Newt Minion, MD;  Location: [place];  Service: Orthopedics;  Laterality: Left;  . AMPUTATION Left 10/31/2014   Procedure: Midfoot Amputation;  Surgeon: Newt Minion, MD;  Location: [place];  Service: Orthopedics;  Laterality: Left;  . BYPASS GRAFT POPLITEAL TO TIBIAL Left 12/13/2012   Procedure: BYPASS GRAFT POPLITEAL TO TIBIAL;  Surgeon:  Serafina Mitchell, MD;  Location: [facility];  Service: Vascular;  Laterality: Left;  Left Popliteal to Posterior Tibial Bypass Graft with reversed saphenous vein graft  . CARDIAC CATHETERIZATION    . I&D EXTREMITY Left 12/08/2012   Procedure: IRRIGATION AND DEBRIDEMENT EXTREMITY;  Surgeon: Mcarthur Rossetti, MD;  Location: [place];  Service: Orthopedics;  Laterality: Left;  . I&D EXTREMITY Left 12/11/2012   Procedure: REPEAT I&D LEFT FOOT;  Surgeon: Mcarthur Rossetti, MD;  Location: [place];  Service: Orthopedics;  Laterality: Left;  . INCISION AND DRAINAGE Right 06/25/2013   Procedure: INCISION AND DRAINAGE RIGHT FOOT;  Surgeon: Mcarthur Rossetti, MD;  Location: [facility];  Service: Orthopedics;  Laterality: Right;  . LEFT HEART CATHETERIZATION WITH CORONARY ANGIOGRAM [name] 06/28/2013   Procedure: LEFT HEART CATHETERIZATION WITH CORONARY ANGIOGRAM;  Surgeon: Burnell Blanks, MD;  Location: [facility] CATH LAB;  Service: Cardiovascular;  Laterality: [name];  . PENILE PROSTHESIS IMPLANT    . STUMP REVISION Right 08/16/2013   Procedure: STUMP REVISION- right Below Knee Amputation;  Surgeon: Newt Minion, MD;  Location: [place];  Service: Orthopedics;  Laterality: Right;  . TEE WITHOUT CARDIOVERSION [name] 07/03/2013   Procedure: TRANSESOPHAGEAL ECHOCARDIOGRAM (TEE);  Surgeon: Larey Dresser, MD;  Location: [place];  Service: Cardiovascular;  Laterality: [name];  . TONSILLECTOMY AND ADENOIDECTOMY      Prior to Admission medications  Medication Sig Start Date End Date Taking? Authorizing Provider  aspirin EC 81 MG tablet Take 1 tablet (81 mg total) by mouth daily. 01/09/14  Yes Kathie Dike, MD  atorvastatin (LIPITOR) 80 MG tablet Take 1 tablet (80 mg total) by mouth daily. Patient taking differently: Take 80 mg by mouth every evening.  09/29/14  Yes BranchAlphonse Guild, MD  carvedilol (COREG) 25 MG tablet Take 25 mg by mouth 2 (two) times daily.    Yes [provider]  furosemide (LASIX) 80 MG  tablet Take 80 mg by mouth daily. 08/08/13  Yes Angiulli, Lavon Paganini, PA-C  HUMALOG KWIKPEN 100 UNIT/ML KiwkPen Inject 22 Units into the skin 3 (three) times daily before meals.  07/06/15  Yes [provider]  hydrALAZINE (APRESOLINE) 10 MG tablet Take 1 tablet (10 mg total) by mouth 3 (three) times daily. 10/01/13  Yes Burtis Junes, NP  LEVEMIR FLEXTOUCH 100 UNIT/ML Pen Inject 52 Units into the skin at bedtime.  04/23/15  Yes [provider]  losartan (COZAAR) 50 MG tablet TAKE 1 TABLET BY MOUTH  DAILY 02/27/17  Yes Branch, Alphonse Guild, MD  mupirocin ointment (BACTROBAN) 2 % Apply 1 application topically daily. Patient taking differently: Apply 1 application topically daily. Apply to left foot 03/22/17  Yes Dondra Prader R, NP  naproxen sodium (ANAPROX) 220 MG tablet Take 220 mg by mouth daily as needed (pain).   Yes [provider]  nitroGLYCERIN (NITRODUR - DOSED IN MG/24 HR) 0.2 mg/hr patch Place 0.2 mg onto the skin every 3 (three) days. Applies to foot/ankle area to increase blood flow 12/23/14  Yes [provider]  pantoprazole (PROTONIX) 40 MG tablet Take 1 tablet (40 mg total) by mouth daily. 08/08/13  Yes Angiulli, Lavon Paganini, PA-C  polyethylene glycol-electrolytes (TRILYTE) 420 g solution Take 4,000 mLs by mouth as directed. 02/21/17  Yes Rourk, Cristopher Estimable, MD  potassium chloride (K-DUR,KLOR-CON) 10 MEQ tablet Take 1 tablet (10 mEq total) by mouth daily. 04/08/15  Yes Branch, Alphonse Guild, MD  doxycycline (VIBRA-TABS) 100 MG tablet Take 1 tablet (100 mg total) by mouth 2 (two) times daily. Patient not taking: Reported on 04/11/2017 03/22/17   Suzan Slick, NP  polyethylene glycol [facility] / Floria Raveling) packet Take 17 g by mouth daily as needed for mild constipation.     [provider]  promethazine (PHENERGAN) 25 MG tablet Take 1 tab every 6-8 hours for nausea. Patient taking differently: Take 25 mg by mouth every 6 (six) hours as needed for nausea or vomiting.   10/18/13   Esterwood, Amy S, PA-C  senna-docusate (SENOKOT-S) 8.6-50 MG per tablet Take 1 tablet by mouth daily as needed for mild constipation.  08/08/13   Angiulli, Lavon Paganini, PA-C    Allergies as of 02/08/2017 - Review Complete 02/07/2017  Allergen Reaction Noted  . Amoxicillin Nausea And Vomiting 12/07/2012    Family History  Problem Relation Age of Onset  . Pancreatic cancer Mother   . Diabetes Mother   . Hyperlipidemia Mother   . Diabetes Unknown   . Breast cancer Sister   . Depression Maternal Grandmother   . Heart disease Maternal Grandmother   . Depression Maternal Grandfather   . Heart disease Maternal Grandfather   . Depression Paternal Grandmother   . Heart disease Paternal Grandmother   . Depression Paternal Grandfather   . Heart disease Paternal Grandfather     Social History   Social History  . Marital status: Divorced  Spouse name: [name]  . Number of children: 2  . Years of education: [name]   Occupational History  . 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 Smoking status: Never Smoker  . Smokeless tobacco: Never Used  . Alcohol use No  . Drug use: No  . Sexual activity: No   Other Topics Concern  . Not on file   Social History Narrative   Lives in [place]   Wife: Judeen Hammans    Review of Systems: See HPI, otherwise negative ROS   Physical Exam: BP 119/77   Pulse 72   Temp 98.1 F (36.7 C) (Oral)   Resp 11   Ht 5\' 11"  (1.803 m)   Wt 262 lb (118.8 kg)   SpO2 100%   BMI 36.54 kg/m  General:   Alert,  pleasant and cooperative in NAD Head:  Normocephalic and atraumatic. Neck:  Supple; Lungs:  Clear throughout to auscultation.    Heart:  Regular rate and rhythm. Abdomen:  Soft, nontender and nondistended. Normal bowel sounds, without guarding, and without rebound.   Neurologic:  Alert and  oriented x4;  grossly normal neurologically.  Impression/Plan:     SCREENING  Plan:  1. TCS TODAY.  DISCUSSED PROCEDURE, BENEFITS, & RISKS: < 1% chance of medication reaction, bleeding, perforation, or rupture of spleen/liver.

## 2017-04-14 NOTE — Op Note (Signed)
[facility] Patient Name: Albert [name] Procedure Date: 04/14/2017 10:37 AM MRN: [id number] Date of Birth: 01-12-1961 Attending MD: Barney Drain , MD CSN: [id number] Age: 56 Admit Type: Outpatient Procedure:                Colonoscopy WITH COLD FORCEPS POLYPECTOMY Indications:              Screening for colorectal malignant neoplasm Providers:                Barney Drain, MD, Otis Peak B. Sharon Seller, RN, Randa Spike, Technician Referring MD:             Edwinna Areola. [place] Crane MD Medicines:                Meperidine 100 mg IV, Midazolam 5 mg IV Complications:            No immediate complications. Estimated Blood Loss:     Estimated blood loss was minimal. Procedure:                Pre-Anesthesia Assessment:                           - Prior to the procedure, a History and Physical                            was performed, and patient medications and                            allergies were reviewed. The patient's tolerance of                            previous anesthesia was also reviewed. The risks                            and benefits of the procedure and the sedation                            options and risks were discussed with the patient.                            All questions were answered, and informed consent                            was obtained. Prior Anticoagulants: The patient has                            taken aspirin, last dose was 2 days prior to                            procedure. ASA Grade Assessment: II - A patient                            with mild systemic disease. After reviewing the  risks and benefits, the patient was deemed in                            satisfactory condition to undergo the procedure.                            After obtaining informed consent, the colonoscope                            was passed under direct vision. Throughout the                            procedure, the  patient's blood pressure, pulse, and                            oxygen saturations were monitored continuously. The                            EC-[id number] ([id number]) scope was introduced through                            the anus and advanced to the the cecum, identified                            by appendiceal orifice and ileocecal valve. The                            colonoscopy was technically difficult and complex                            due to a tortuous colon. Successful completion of                            the procedure was aided by changing the patient to                            a supine position, using manual pressure and                            COLOWRAP. The patient tolerated the procedure well.                            The quality of the bowel preparation was good. The                            ileocecal valve, appendiceal orifice, and rectum                            were photographed. Scope In: 10:54:35 AM Scope Out: 11:10:44 AM Scope Withdrawal Time: 0 hours 10 minutes 4 seconds  Total Procedure Duration: 0 hours 16 minutes 9 seconds  Findings:      A 4 mm polyp was found in the mid ascending colon. The polyp was  sessile. The polyp was removed with a cold biopsy forceps. Resection and       retrieval were complete.      The recto-sigmoid colon, sigmoid colon and transverse colon were       moderately redundant.      External and internal hemorrhoids were found during retroflexion. The       hemorrhoids were mild and moderate. Impression:               - One 4 mm polyp in the mid ascending colon,                            removed with a cold biopsy forceps. Resected and                            retrieved.                           - Redundant colon.                           - External and internal hemorrhoids. Moderate Sedation:      Moderate (conscious) sedation was administered by the endoscopy nurse       and supervised by the endoscopist.  The following parameters were       monitored: oxygen saturation, heart rate, blood pressure, and response       to care. Total physician intraservice time was 29 minutes. Recommendation:           - Repeat colonoscopy in 5-10 years for surveillance.                           - High fiber diet.                           - Continue present medications.                           - Await pathology results.                           - Patient has a contact number available for                            emergencies. The signs and symptoms of potential                            delayed complications were discussed with the                            patient. Return to normal activities tomorrow.                            Written discharge instructions were provided to the                            patient. Procedure Code(s):        --- Professional ---  46962, Colonoscopy, flexible; with biopsy, single                            or multiple                           99152, Moderate sedation services provided by the                            same physician or other qualified health care                            professional performing the diagnostic or                            therapeutic service that the sedation supports,                            requiring the presence of an independent trained                            observer to assist in the monitoring of the                            patient's level of consciousness and physiological                            status; initial 15 minutes of intraservice time,                            patient age 10 years or older                           [phone], Moderate sedation services; each additional                            15 minutes intraservice time Diagnosis Code(s):        --- Professional ---                           Z12.11, Encounter for screening for malignant                            neoplasm of  colon                           D12.2, Benign neoplasm of ascending colon                           K64.8, Other hemorrhoids                           Q43.8, Other specified congenital malformations of                            intestine CPT copyright 2016 American Medical  Association. All rights reserved. The codes documented in this report are preliminary and upon coder review may  be revised to meet current compliance requirements. Barney Drain, MD Barney Drain, MD 04/14/2017 11:17:32 AM This report has been signed electronically. Number of Addenda: 0

## 2017-04-17 ENCOUNTER — Encounter (HOSPITAL_COMMUNITY): Payer: Self-pay | Admitting: Gastroenterology

## 2017-04-21 NOTE — Progress Notes (Signed)
Pt is aware.  

## 2017-05-01 ENCOUNTER — Ambulatory Visit (INDEPENDENT_AMBULATORY_CARE_PROVIDER_SITE_OTHER): Payer: Medicare Other | Admitting: Family

## 2017-05-01 ENCOUNTER — Encounter (INDEPENDENT_AMBULATORY_CARE_PROVIDER_SITE_OTHER): Payer: Self-pay | Admitting: Family

## 2017-05-01 VITALS — Ht 71.0 in | Wt 262.0 lb

## 2017-05-01 DIAGNOSIS — E1165 Type 2 diabetes mellitus with hyperglycemia: Secondary | ICD-10-CM | POA: Diagnosis not present

## 2017-05-01 DIAGNOSIS — L97509 Non-pressure chronic ulcer of other part of unspecified foot with unspecified severity: Secondary | ICD-10-CM | POA: Diagnosis not present

## 2017-05-01 DIAGNOSIS — Z794 Long term (current) use of insulin: Secondary | ICD-10-CM

## 2017-05-01 DIAGNOSIS — I739 Peripheral vascular disease, unspecified: Secondary | ICD-10-CM | POA: Diagnosis not present

## 2017-05-01 DIAGNOSIS — IMO0002 Reserved for concepts with insufficient information to code with codable children: Secondary | ICD-10-CM

## 2017-05-01 DIAGNOSIS — L97421 Non-pressure chronic ulcer of left heel and midfoot limited to breakdown of skin: Secondary | ICD-10-CM

## 2017-05-01 DIAGNOSIS — E11621 Type 2 diabetes mellitus with foot ulcer: Secondary | ICD-10-CM

## 2017-05-01 NOTE — Progress Notes (Signed)
Office Visit Note   Patient: Albert [name]           Date of Birth: 1960-11-20           MRN: [id number] Visit Date: 05/01/2017              Requested by: Celene Squibb, MD [address] [place], [place] [address] PCP: Celene Squibb, MD  Chief Complaint  Patient presents with  . Left Foot - Follow-up    Left Foot Transmetatarsal Amputation       HPI: Patient is a 56 year old gentleman with diabetic insensate neuropathy transmetatarsal amputation on the left who presents in follow-up for an ulcer. He is using a nitroglycerin patch to improve microcirculation he has extra-depth shoes with custom orthotics. Has been applying neosporin dressings daily.  Assessment & Plan: Visit Diagnoses:  1. Midfoot ulcer, left, limited to breakdown of skin ([place])   2. Peripheral vascular disease, unspecified ([place])   3. Uncontrolled type 2 diabetes mellitus with foot ulcer, with long-term current use of insulin ([place])     Plan: Ulcer debridement of skin and soft tissue plan to continue protective shoe wear and continue with a Band-Aid and antibiotic ointment  Follow-Up Instructions: No Follow-up on file.   Ortho Exam  Patient is alert, oriented, no adenopathy, well-dressed, normal affect, normal respiratory effort. Examination patient has an antalgic gait. He has a persistent Wagner grade 1 ulcer left transmetatarsal amputation. After informed consent a 10 blade knife was used to debride the skin and soft tissue back to healthy viable granulation tissue. The callus is 2 cm in diameter, central ulceration is 5 mm in diameter. This does not probe to bone or tendon no drainage no odor. Iodosorb was a Band-Aid applied.  Imaging: No results found.  Labs: Lab Results  Component Value Date   HGBA1C 9.1 (H) 02/08/2016   HGBA1C 8.2 (H) 05/16/2015   HGBA1C 8.2 (H) 06/25/2013   REPTSTATUS 02/13/2016 FINAL 02/08/2016   CULT NO GROWTH 5 DAYS 02/08/2016   LABORGA STAPHYLOCOCCUS AUREUS 06/26/2013     Orders:  No orders of the defined types were placed in this encounter.  No orders of the defined types were placed in this encounter.    Procedures: No procedures performed  Clinical Data: No additional findings.  ROS:  All other systems negative, except as noted in the HPI. Review of Systems  Constitutional: Negative for chills and fever.  Skin: Positive for wound.    Objective: Vital Signs: Ht 5\' 11"  (1.803 m)   Wt 262 lb (118.8 kg)   BMI 36.54 kg/m   Specialty Comments:  No specialty comments available.  PMFS History: Patient Active Problem List   Diagnosis Date Noted  . Special screening for malignant neoplasms, colon   . Midfoot ulcer, left, limited to breakdown of skin ([place]) 11/18/2016  . ARF (acute renal failure) ([place]) 02/08/2016  . Chronic combined systolic and diastolic CHF (congestive heart failure) ([place]) 02/08/2016  . Splenic infarct 01/06/2014  . Acute systolic CHF (congestive heart failure) ([place]) 08/03/2013  . Hx of right BKA ([place]) 07/26/2013  . Anemia 07/15/2013  . Hyperkalemia 07/15/2013  . Hyposmolality and/or hyponatremia 07/15/2013  . Acute respiratory failure ([place]) 07/15/2013  . Mitral regurgitation 07/15/2013  . Oliguria 07/15/2013  . Bacteremia due to Staphylococcus aureus 07/15/2013  . Hyperchloremia 07/15/2013  . Acute kidney failure ([place]) 07/14/2013  . Hypotension, unspecified 07/14/2013  . Acute systolic heart failure ([place]) 07/12/2013  . Acute pulmonary edema (  [place]) 07/11/2013  . Pneumonia, organism unspecified(486) 07/11/2013  . Hypoxemia 07/11/2013  . Cardiomyopathy, ischemic 07/03/2013  . Coronary atherosclerosis of native coronary artery 07/01/2013  . NSTEMI (non-ST elevated myocardial infarction) ([place]) 06/26/2013  . AKI (acute kidney injury) ([place]) 06/25/2013  . Shock circulatory ([place]) 06/25/2013  . Dehydration with hyponatremia 06/25/2013  . Leukocytosis, unspecified 06/25/2013  . Sepsis(995.91) 06/25/2013  . Aftercare  following surgery of the circulatory system, [place] 02/11/2013  . Peripheral vascular disease, unspecified ([place]) 12/31/2012  . Diabetes mellitus out of control ([place]) 12/17/2012  . Essential hypertension, benign 12/17/2012  . Atherosclerotic peripheral vascular disease ([place]) 12/17/2012  . Hypercholesteremia    Past Medical History:  Diagnosis Date  . Allergic rhinitis   . Anemia    takes supplement  . Chronic cough   . Chronic sinusitis   . Complication of anesthesia    couldn't swallow and talk  . Coronary artery disease   . CVA (cerebral infarction) 2011   R hand deficit  . Diabetes mellitus without complication ([place]) ~3559   last HbA1c ~9 .... fasting 160s  . GERD (gastroesophageal reflux disease)   . Hypercholesteremia   . Hypertension   . Myocardial infarction ([place])   . Osteomyelitis ([place])    first and third metatarsal  . Pneumonia    hx of walking  . Splenic infarct    setting of lupus anticoagulant  . Stroke [facility])    right arm weakness    Family History  Problem Relation Age of Onset  . Pancreatic cancer Mother   . Diabetes Mother   . Hyperlipidemia Mother   . Diabetes Unknown   . Breast cancer Sister   . Depression Maternal Grandmother   . Heart disease Maternal Grandmother   . Depression Maternal Grandfather   . Heart disease Maternal Grandfather   . Depression Paternal Grandmother   . Heart disease Paternal Grandmother   . Depression Paternal Grandfather   . Heart disease Paternal Grandfather     Past Surgical History:  Procedure Laterality Date  . 4th Toe Amputation Left Jan. 2014   4th toe in [place]  . AMPUTATION Left 12/08/2012   Procedure: AMPUTATION DIGIT;  Surgeon: Mcarthur Rossetti, MD;  Location: [facility];  Service: Orthopedics;  Laterality: Left;  3rd toe amputation possible 5th toe amputation  . AMPUTATION Left 12/11/2012   Procedure: Amputation of 2nd Toe;  Surgeon: Mcarthur Rossetti, MD;  Location: [facility];  Service: Orthopedics;   Laterality: Left;  . AMPUTATION Right 06/29/2013   Procedure: AMPUTATION BELOW KNEE;  Surgeon: Newt Minion, MD;  Location: [place];  Service: Orthopedics;  Laterality: Right;  . AMPUTATION Left 08/29/2014   Procedure: Left Great Toe Amputation at MTP;  Surgeon: Newt Minion, MD;  Location: [place];  Service: Orthopedics;  Laterality: Left;  . AMPUTATION Left 10/31/2014   Procedure: Midfoot Amputation;  Surgeon: Newt Minion, MD;  Location: [facility];  Service: Orthopedics;  Laterality: Left;  . BYPASS GRAFT POPLITEAL TO TIBIAL Left 12/13/2012   Procedure: BYPASS GRAFT POPLITEAL TO TIBIAL;  Surgeon: Serafina Mitchell, MD;  [facility];  Service: Vascular;  Laterality: Left;  Left Popliteal to Posterior Tibial Bypass Graft with reversed saphenous vein graft  . CARDIAC CATHETERIZATION    . COLONOSCOPY [name] 04/14/2017   Procedure: COLONOSCOPY;  Surgeon: Danie Binder, MD;  [facility];  Service: Endoscopy;  Laterality: [name];  1000  . I&D EXTREMITY Left 12/08/2012   Procedure: IRRIGATION AND  DEBRIDEMENT EXTREMITY;  Surgeon: Mcarthur Rossetti, MD;  Location: [place];  Service: Orthopedics;  Laterality: Left;  . I&D EXTREMITY Left 12/11/2012   Procedure: REPEAT I&D LEFT FOOT;  Surgeon: Mcarthur Rossetti, MD;  Location: [place];  Service: Orthopedics;  Laterality: Left;  . INCISION AND DRAINAGE Right 06/25/2013   Procedure: INCISION AND DRAINAGE RIGHT FOOT;  Surgeon: Mcarthur Rossetti, MD;  [facility];  Service: Orthopedics;  Laterality: Right;  . LEFT HEART CATHETERIZATION WITH CORONARY ANGIOGRAM [name] 06/28/2013   Procedure: LEFT HEART CATHETERIZATION WITH CORONARY ANGIOGRAM;  Surgeon: Burnell Blanks, MD;  [facility];  Service: Cardiovascular;  Laterality: [name];  . PENILE PROSTHESIS IMPLANT    . POLYPECTOMY  04/14/2017   Procedure: POLYPECTOMY;  Surgeon: Danie Binder, MD;  [facility];  Service: Endoscopy;;  colon  . STUMP REVISION Right 08/16/2013    Procedure: STUMP REVISION- right Below Knee Amputation;  Surgeon: Newt Minion, MD;  Location: [place];  Service: Orthopedics;  Laterality: Right;  . TEE WITHOUT CARDIOVERSION [name] 07/03/2013   Procedure: TRANSESOPHAGEAL ECHOCARDIOGRAM (TEE);  Surgeon: Larey Dresser, MD;  Location: [place];  Service: Cardiovascular;  Laterality: [name];  . TONSILLECTOMY AND ADENOIDECTOMY     Social History   Occupational History  . 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 Smoking status: Never Smoker  . Smokeless tobacco: Never Used  . Alcohol use No  . Drug use: No  . Sexual activity: No

## 2017-06-01 ENCOUNTER — Other Ambulatory Visit: Payer: Self-pay | Admitting: Cardiology

## 2017-06-12 ENCOUNTER — Ambulatory Visit (INDEPENDENT_AMBULATORY_CARE_PROVIDER_SITE_OTHER): Payer: Medicare Other | Admitting: Family

## 2017-06-12 DIAGNOSIS — Z89432 Acquired absence of left foot: Secondary | ICD-10-CM | POA: Insufficient documentation

## 2017-06-12 DIAGNOSIS — L97421 Non-pressure chronic ulcer of left heel and midfoot limited to breakdown of skin: Secondary | ICD-10-CM | POA: Diagnosis not present

## 2017-06-12 NOTE — Progress Notes (Signed)
Office Visit Note   Patient: Albert [name]           Date of Birth: 05-03-1961           MRN: [id number] Visit Date: 06/12/2017              Requested by: Albert Squibb, MD [address] [place],  [address] PCP: Albert Squibb, MD  No chief complaint on file.     HPI: Patient is a 56 year old gentleman with diabetic insensate neuropathy transmetatarsal amputation on the left who presents in follow-up for an ulcer. He is using a nitroglycerin patch to improve microcirculation he has extra-depth shoes with custom orthotics. Has been applying roll gauze dry dressings daily. Assessment & Plan: Visit Diagnoses:  1. Midfoot ulcer, left, limited to breakdown of skin (Albert [name])   2. History of transmetatarsal amputation of left foot (Albert [name])     Plan: Ulcer debridement of skin and soft tissue plan to continue protective shoe wear and continue with a Band-Aid and antibiotic ointment  Follow-Up Instructions: Return in about 2 months (around 08/12/2017).   Ortho Exam  Patient is alert, oriented, no adenopathy, well-dressed, normal affect, normal respiratory effort. Examination patient has an antalgic gait. He has a persistent Wagner grade 1 ulcer left transmetatarsal amputation. After informed consent a 10 blade knife was used to debride the skin and soft tissue back to healthy viable granulation tissue. The callus is 15 mm in diameter, central ulceration is 3 mm in diameter. This does not probe to bone or tendon no drainage no odor. Iodosorb was a Band-Aid applied.  Imaging: No results found.  Labs: Lab Results  Component Value Date   HGBA1C 9.1 (H) 02/08/2016   HGBA1C 8.2 (H) 05/16/2015   HGBA1C 8.2 (H) 06/25/2013   REPTSTATUS 02/13/2016 FINAL 02/08/2016   CULT NO GROWTH 5 DAYS 02/08/2016   LABORGA STAPHYLOCOCCUS AUREUS 06/26/2013    Orders:  No orders of the defined types were placed in this encounter.  No orders of the defined types were placed in this encounter.    Procedures: No procedures performed  Clinical Data: No additional findings.  ROS:  All other systems negative, except as noted in the HPI. Review of Systems  Constitutional: Negative for chills and fever.  Skin: Positive for wound.    Objective: Vital Signs: There were no vitals taken for this visit.  Specialty Comments:  No specialty comments available.  PMFS History: Patient Active Problem List   Diagnosis Date Noted  . History of transmetatarsal amputation of left foot (Albert [name]) 06/12/2017  . Special screening for malignant neoplasms, colon   . Midfoot ulcer, left, limited to breakdown of skin (Albert [name]) 11/18/2016  . ARF (acute renal failure) (Albert [name]) 02/08/2016  . Chronic combined systolic and diastolic CHF (congestive heart failure) (Albert [name]) 02/08/2016  . Splenic infarct 01/06/2014  . Acute systolic CHF (congestive heart failure) (Albert [name]) 08/03/2013  . Hx of right BKA (Albert [name]) 07/26/2013  . Anemia 07/15/2013  . Hyperkalemia 07/15/2013  . Hyposmolality and/or [name] 07/15/2013  . Acute respiratory failure ([place]) 07/15/2013  . Mitral regurgitation 07/15/2013  . Oliguria 07/15/2013  . Bacteremia due to Staphylococcus aureus 07/15/2013  . Hyperchloremia 07/15/2013  . Acute kidney failure (Albert [name]) 07/14/2013  . Hypotension, unspecified 07/14/2013  . Acute systolic heart failure (Albert [name]) 07/12/2013  . Acute pulmonary edema (Albert [name]) 07/11/2013  . Pneumonia, organism unspecified(486) 07/11/2013  . Hypoxemia 07/11/2013  . Cardiomyopathy, ischemic 07/03/2013  . Coronary atherosclerosis of native coronary artery 07/01/2013  .  NSTEMI (non-ST elevated myocardial infarction) (Albert [name]) 06/26/2013  . AKI (acute kidney injury) (Albert [name]) 06/25/2013  . Shock circulatory (Albert [name]) 06/25/2013  . Albert [name] 06/25/2013  . Leukocytosis, unspecified 06/25/2013  . Sepsis(995.91) 06/25/2013  . Aftercare following surgery of the circulatory system, Albert [name] 02/11/2013  . Peripheral vascular disease,  unspecified (Albert [name]) 12/31/2012  . Diabetes mellitus out of control (Albert [name]) 12/17/2012  . Essential hypertension, benign 12/17/2012  . Atherosclerotic peripheral vascular disease (Albert [name]) 12/17/2012  . Hypercholesteremia    Past Medical History:  Diagnosis Date  . Allergic rhinitis   . Anemia    takes supplement  . Chronic cough   . Chronic sinusitis   . Complication of anesthesia    couldn't swallow and talk  . Coronary artery disease   . CVA (cerebral infarction) 2011   R hand deficit  . Diabetes mellitus without complication (Albert [name]) ~5462   last HbA1c ~9 .... fasting 160s  . GERD (gastroesophageal reflux disease)   . Hypercholesteremia   . Hypertension   . Myocardial infarction (Albert [name])   . Osteomyelitis (Albert [name])    first and third metatarsal  . Pneumonia    hx of walking  . Splenic infarct    setting of lupus anticoagulant  . Stroke Albert [name])    right arm weakness    Family History  Problem Relation Age of Onset  . Pancreatic cancer Mother   . Diabetes Mother   . Hyperlipidemia Mother   . Diabetes Unknown   . Breast cancer Sister   . Depression Maternal Grandmother   . Heart disease Maternal Grandmother   . Depression Maternal Grandfather   . Heart disease Maternal Grandfather   . Depression Paternal Grandmother   . Heart disease Paternal Grandmother   . Depression Paternal Grandfather   . Heart disease Paternal Grandfather     Past Surgical History:  Procedure Laterality Date  . 4th Toe Amputation Left Jan. 2014   4th toe in [place]  . AMPUTATION Left 12/08/2012   Procedure: AMPUTATION DIGIT;  Surgeon: Mcarthur Rossetti, MD;  Location: [place];  Service: Orthopedics;  Laterality: Left;  3rd toe amputation possible 5th toe amputation  . AMPUTATION Left 12/11/2012   Procedure: Amputation of 2nd Toe;  Surgeon: Mcarthur Rossetti, MD;  Location: [facility];  Service: Orthopedics;  Laterality: Left;  . AMPUTATION Right 06/29/2013   Procedure: AMPUTATION BELOW KNEE;  Surgeon: Newt Minion, MD;  Location: [place];  Service: Orthopedics;  Laterality: Right;  . AMPUTATION Left 08/29/2014   Procedure: Left Great Toe Amputation at MTP;  Surgeon: Newt Minion, MD;  Location: [facility];  Service: Orthopedics;  Laterality: Left;  . AMPUTATION Left 10/31/2014   Procedure: Midfoot Amputation;  Surgeon: Newt Minion, MD;  Location: [facility];  Service: Orthopedics;  Laterality: Left;  . BYPASS GRAFT POPLITEAL TO TIBIAL Left 12/13/2012   Procedure: BYPASS GRAFT POPLITEAL TO TIBIAL;  Surgeon: Serafina Mitchell, MD;  [facility];  Service: Vascular;  Laterality: Left;  Left Popliteal to Posterior Tibial Bypass Graft with reversed saphenous vein graft  . CARDIAC CATHETERIZATION    . COLONOSCOPY [name] 04/14/2017   Procedure: COLONOSCOPY;  Surgeon: Danie Binder, MD;  [facility];  Service: Endoscopy;  Laterality: [name];  1000  . I&D EXTREMITY Left 12/08/2012   Procedure: IRRIGATION AND DEBRIDEMENT EXTREMITY;  Surgeon: Mcarthur Rossetti, MD;  Location: [facility];  Service: Orthopedics;  Laterality: Left;  . I&D EXTREMITY Left 12/11/2012   Procedure: REPEAT  I&D LEFT FOOT;  Surgeon: Mcarthur Rossetti, MD;  Location: [place];  Service: Orthopedics;  Laterality: Left;  . INCISION AND DRAINAGE Right 06/25/2013   Procedure: INCISION AND DRAINAGE RIGHT FOOT;  Surgeon: Mcarthur Rossetti, MD;  [facility];  Service: Orthopedics;  Laterality: Right;  . LEFT HEART CATHETERIZATION WITH CORONARY ANGIOGRAM [name] 06/28/2013   Procedure: LEFT HEART CATHETERIZATION WITH CORONARY ANGIOGRAM;  Surgeon: Burnell Blanks, MD;  Location: [facility]ATH LAB;  Service: Cardiovascular;  Laterality: [name];  . PENILE PROSTHESIS IMPLANT    . POLYPECTOMY  04/14/2017   Procedure: POLYPECTOMY;  Surgeon: Danie Binder, MD;  [facility];  Service: Endoscopy;;  colon  . STUMP REVISION Right 08/16/2013   Procedure: STUMP REVISION- right Below Knee Amputation;  Surgeon: Newt Minion, MD;  Location: [place];   Service: Orthopedics;  Laterality: Right;  . TEE WITHOUT CARDIOVERSION [name] 07/03/2013   Procedure: TRANSESOPHAGEAL ECHOCARDIOGRAM (TEE);  Surgeon: Larey Dresser, MD;  Location: [place];  Service: Cardiovascular;  Laterality: [name];  . TONSILLECTOMY AND ADENOIDECTOMY     Social History   Occupational History  . 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 Smoking status: Never Smoker  . Smokeless tobacco: Never Used  . Alcohol use No  . Drug use: No  . Sexual activity: No

## 2017-08-10 ENCOUNTER — Encounter (INDEPENDENT_AMBULATORY_CARE_PROVIDER_SITE_OTHER): Payer: Self-pay | Admitting: Family

## 2017-08-10 ENCOUNTER — Ambulatory Visit (INDEPENDENT_AMBULATORY_CARE_PROVIDER_SITE_OTHER): Payer: Medicare Other | Admitting: Family

## 2017-08-10 DIAGNOSIS — L97421 Non-pressure chronic ulcer of left heel and midfoot limited to breakdown of skin: Secondary | ICD-10-CM

## 2017-08-10 DIAGNOSIS — Z89432 Acquired absence of left foot: Secondary | ICD-10-CM | POA: Diagnosis not present

## 2017-08-10 DIAGNOSIS — I739 Peripheral vascular disease, unspecified: Secondary | ICD-10-CM

## 2017-08-10 NOTE — Progress Notes (Signed)
Office Visit Note   Patient: Albert [name]           Date of Birth: [birth year]           MRN: [id number] Visit Date: 08/10/2017              Requested by: Celene Squibb, MD [address] [place], [place] [address] PCP: Celene Squibb, MD  Chief Complaint  Patient presents with  . Left Foot - Follow-up      HPI: Patient is a 56 year old gentleman with diabetic insensate neuropathy and transmetatarsal amputation on the left who presents in follow-up for an ulcer. He is using a nitroglycerin patch to improve microcirculation he has extra-depth shoes with custom orthotics. Has been applying roll gauze dry dressings daily.  Assessment & Plan: Visit Diagnoses:  No diagnosis found.  Plan: Ulcer debridement of skin and soft tissue plan to continue protective shoe wear and continue with a Band-Aid and antibiotic ointment  Follow-Up Instructions: No Follow-up on file.   Ortho Exam  Patient is alert, oriented, no adenopathy, well-dressed, normal affect, normal respiratory effort. Examination patient has an antalgic gait. He has a persistent Wagner grade 1 ulcer left transmetatarsal amputation. After informed consent a 10 blade knife was used to debride the skin and soft tissue back to healthy viable granulation tissue. The callus is 15 mm in diameter, central ulceration is 3 mm in diameter. This does not probe to bone or tendon no drainage no odor. Mupirocin and a Band-Aid applied.  Imaging: No results found.  Labs: Lab Results  Component Value Date   HGBA1C 9.1 (H) 02/08/2016   HGBA1C 8.2 (H) 05/16/2015   HGBA1C 8.2 (H) 06/25/2013   REPTSTATUS 02/13/2016 FINAL 02/08/2016   CULT NO GROWTH 5 DAYS 02/08/2016   LABORGA STAPHYLOCOCCUS AUREUS 06/26/2013    Orders:  No orders of the defined types were placed in this encounter.  No orders of the defined types were placed in this encounter.    Procedures: No procedures performed  Clinical Data: No additional  findings.  ROS:  All other systems negative, except as noted in the HPI. Review of Systems  Constitutional: Negative for chills and fever.  Skin: Positive for wound.    Objective: Vital Signs: There were no vitals taken for this visit.  Specialty Comments:  No specialty comments available.  PMFS History: Patient Active Problem List   Diagnosis Date Noted  . History of transmetatarsal amputation of left foot ([place]) 06/12/2017  . Special screening for malignant neoplasms, colon   . Midfoot ulcer, left, limited to breakdown of skin ([place]) 11/18/2016  . ARF (acute renal failure) ([place]) 02/08/2016  . Chronic combined systolic and diastolic CHF (congestive heart failure) ([place]) 02/08/2016  . Splenic infarct 01/06/2014  . Acute systolic CHF (congestive heart failure) ([place]) 08/03/2013  . Hx of right BKA ([place]) 07/26/2013  . Anemia 07/15/2013  . Hyperkalemia 07/15/2013  . Hyposmolality and/or hyponatremia 07/15/2013  . Acute respiratory failure ([place]) 07/15/2013  . Mitral regurgitation 07/15/2013  . Oliguria 07/15/2013  . Bacteremia due to Staphylococcus aureus 07/15/2013  . Hyperchloremia 07/15/2013  . Acute kidney failure ([place]) 07/14/2013  . Hypotension, unspecified 07/14/2013  . Acute systolic heart failure ([place]) 07/12/2013  . Acute pulmonary edema ([place]) 07/11/2013  . Pneumonia, organism unspecified(486) 07/11/2013  . Hypoxemia 07/11/2013  . Cardiomyopathy, ischemic 07/03/2013  . Coronary atherosclerosis of native coronary artery 07/01/2013  . NSTEMI (non-ST elevated myocardial infarction) ([place]) 06/26/2013  . AKI (acute kidney  injury) ([place]) 06/25/2013  . Shock circulatory ([place]) 06/25/2013  . Dehydration with hyponatremia 06/25/2013  . Leukocytosis, unspecified 06/25/2013  . Sepsis(995.91) 06/25/2013  . Aftercare following surgery of the circulatory system, [place] 02/11/2013  . Peripheral vascular disease, unspecified ([place]) 12/31/2012  . Diabetes mellitus out of control ([place])  12/17/2012  . Essential hypertension, benign 12/17/2012  . Atherosclerotic peripheral vascular disease ([place]) 12/17/2012  . Hypercholesteremia    Past Medical History:  Diagnosis Date  . Allergic rhinitis   . Anemia    takes supplement  . Chronic cough   . Chronic sinusitis   . Complication of anesthesia    couldn't swallow and talk  . Coronary artery disease   . CVA (cerebral infarction) 2011   R hand deficit  . Diabetes mellitus without complication ([place]) ~1610   last HbA1c ~9 .... fasting 160s  . GERD (gastroesophageal reflux disease)   . Hypercholesteremia   . Hypertension   . Myocardial infarction ([place])   . Osteomyelitis ([place])    first and third metatarsal  . Pneumonia    hx of walking  . Splenic infarct    setting of lupus anticoagulant  . Stroke [facility])    right arm weakness    Family History  Problem Relation Age of Onset  . Pancreatic cancer Mother   . Diabetes Mother   . Hyperlipidemia Mother   . Diabetes Unknown   . Breast cancer Sister   . Depression Maternal Grandmother   . Heart disease Maternal Grandmother   . Depression Maternal Grandfather   . Heart disease Maternal Grandfather   . Depression Paternal Grandmother   . Heart disease Paternal Grandmother   . Depression Paternal Grandfather   . Heart disease Paternal Grandfather     Past Surgical History:  Procedure Laterality Date  . 4th Toe Amputation Left Jan. 2014   4th toe in [place]  . AMPUTATION Left 12/08/2012   Procedure: AMPUTATION DIGIT;  Surgeon: Mcarthur Rossetti, MD;  Location: [place];  Service: Orthopedics;  Laterality: Left;  3rd toe amputation possible 5th toe amputation  . AMPUTATION Left 12/11/2012   Procedure: Amputation of 2nd Toe;  Surgeon: Mcarthur Rossetti, MD;  Location: [place];  Service: Orthopedics;  Laterality: Left;  . AMPUTATION Right 06/29/2013   Procedure: AMPUTATION BELOW KNEE;  Surgeon: Newt Minion, MD;  Location: [place];  Service: Orthopedics;  Laterality: Right;   . AMPUTATION Left 08/29/2014   Procedure: Left Great Toe Amputation at MTP;  Surgeon: Newt Minion, MD;  Location: [place];  Service: Orthopedics;  Laterality: Left;  . AMPUTATION Left 10/31/2014   Procedure: Midfoot Amputation;  Surgeon: Newt Minion, MD;  Location: [place];  Service: Orthopedics;  Laterality: Left;  . BYPASS GRAFT POPLITEAL TO TIBIAL Left 12/13/2012   Procedure: BYPASS GRAFT POPLITEAL TO TIBIAL;  Surgeon: Serafina Mitchell, MD;  [facility];  Service: Vascular;  Laterality: Left;  Left Popliteal to Posterior Tibial Bypass Graft with reversed saphenous vein graft  . CARDIAC CATHETERIZATION    . COLONOSCOPY [name] 04/14/2017   Procedure: COLONOSCOPY;  Surgeon: Danie Binder, MD;  [facility];  Service: Endoscopy;  Laterality: [name];  1000  . I&D EXTREMITY Left 12/08/2012   Procedure: IRRIGATION AND DEBRIDEMENT EXTREMITY;  Surgeon: Mcarthur Rossetti, MD;  Location: [place];  Service: Orthopedics;  Laterality: Left;  . I&D EXTREMITY Left 12/11/2012   Procedure: REPEAT I&D LEFT FOOT;  Surgeon: Mcarthur Rossetti, MD;  Location: [facility]  OR;  Service: Orthopedics;  Laterality: Left;  . INCISION AND DRAINAGE Right 06/25/2013   Procedure: INCISION AND DRAINAGE RIGHT FOOT;  Surgeon: Mcarthur Rossetti, MD;  [facility];  Service: Orthopedics;  Laterality: Right;  . LEFT HEART CATHETERIZATION WITH CORONARY ANGIOGRAM [name] 06/28/2013   Procedure: LEFT HEART CATHETERIZATION WITH CORONARY ANGIOGRAM;  Surgeon: Burnell Blanks, MD;  [facility] LAB;  Service: Cardiovascular;  Laterality: [name];  . PENILE PROSTHESIS IMPLANT    . POLYPECTOMY  04/14/2017   Procedure: POLYPECTOMY;  Surgeon: Danie Binder, MD;  [facility];  Service: Endoscopy;;  colon  . STUMP REVISION Right 08/16/2013   Procedure: STUMP REVISION- right Below Knee Amputation;  Surgeon: Newt Minion, MD;  Location: [place];  Service: Orthopedics;  Laterality: Right;  . TEE WITHOUT CARDIOVERSION  [name] 07/03/2013   Procedure: TRANSESOPHAGEAL ECHOCARDIOGRAM (TEE);  Surgeon: Larey Dresser, MD;  Location: [place];  Service: Cardiovascular;  Laterality: [name];  . TONSILLECTOMY AND ADENOIDECTOMY     Social History   Occupational History  . 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 Smoking status: Never Smoker  . Smokeless tobacco: Never Used  . Alcohol use No  . Drug use: No  . Sexual activity: No

## 2017-08-14 ENCOUNTER — Ambulatory Visit (INDEPENDENT_AMBULATORY_CARE_PROVIDER_SITE_OTHER): Payer: Medicare Other | Admitting: Family

## 2017-09-19 ENCOUNTER — Ambulatory Visit: Payer: Medicare Other | Admitting: Cardiology

## 2017-09-20 ENCOUNTER — Ambulatory Visit (INDEPENDENT_AMBULATORY_CARE_PROVIDER_SITE_OTHER): Payer: Medicare Other | Admitting: Family

## 2017-09-20 ENCOUNTER — Encounter (INDEPENDENT_AMBULATORY_CARE_PROVIDER_SITE_OTHER): Payer: Self-pay | Admitting: Family

## 2017-09-20 DIAGNOSIS — L97421 Non-pressure chronic ulcer of left heel and midfoot limited to breakdown of skin: Secondary | ICD-10-CM | POA: Diagnosis not present

## 2017-09-20 DIAGNOSIS — Z89432 Acquired absence of left foot: Secondary | ICD-10-CM | POA: Diagnosis not present

## 2017-09-20 DIAGNOSIS — M1611 Unilateral primary osteoarthritis, right hip: Secondary | ICD-10-CM | POA: Diagnosis not present

## 2017-09-20 NOTE — Progress Notes (Signed)
Office Visit Note   Patient: Albert [name]           Date of Birth: [birth year]           MRN: [id number] Visit Date: 08/10/2017              Requested by: Albert Squibb, MD [address] [place], [place] [address] PCP: Albert Squibb, MD  Chief Complaint  Patient presents with  . Left Foot - Follow-up      HPI: Patient is a 56 year old gentleman with diabetic insensate neuropathy and transmetatarsal amputation on the left who presents in follow-up for an ulcer. He is using a nitroglycerin patch to improve microcirculation he has extra-depth shoes with custom orthotics. Has been applying roll gauze dry dressings daily.  Today presents concerned for worsening, has had some bloody drainage the last few days and worse callus.  Assessment & Plan: Visit Diagnoses:  No diagnosis found.  Plan: Ulcer debridement of skin and soft tissue plan to continue protective shoe wear and continue with a Band-Aid and antibiotic ointment  Follow-Up Instructions: No Follow-up on file.   Ortho Exam  Patient is alert, oriented, no adenopathy, well-dressed, normal affect, normal respiratory effort. Examination patient has an antalgic gait. He has a persistent Wagner grade 1 ulcer left transmetatarsal amputation. Build up of hypertrophic callus with some hematoma as well. After informed consent a 10 blade knife was used to debride the skin and soft tissue back to healthy viable granulation tissue. The callus is 2 cm in diameter, central ulceration is 3 mm in diameter. Does not probe today. no drainage no odor. Mupirocin and a Band-Aid applied.  Imaging: No results found.  Labs: Lab Results  Component Value Date   HGBA1C 9.1 (H) 02/08/2016   HGBA1C 8.2 (H) 05/16/2015   HGBA1C 8.2 (H) 06/25/2013   REPTSTATUS 02/13/2016 FINAL 02/08/2016   CULT NO GROWTH 5 DAYS 02/08/2016   LABORGA STAPHYLOCOCCUS AUREUS 06/26/2013    Orders:  No orders of the defined types were placed in this encounter.  No  orders of the defined types were placed in this encounter.    Procedures: No procedures performed  Clinical Data: No additional findings.  ROS:  All other systems negative, except as noted in the HPI. Review of Systems  Constitutional: Negative for chills and fever.  Skin: Positive for wound.    Objective: Vital Signs: There were no vitals taken for this visit.  Specialty Comments:  No specialty comments available.  PMFS History: Patient Active Problem List   Diagnosis Date Noted  . History of transmetatarsal amputation of left foot ([place]) 06/12/2017  . Special screening for malignant neoplasms, colon   . Midfoot ulcer, left, limited to breakdown of skin ([place]) 11/18/2016  . ARF (acute renal failure) ([place]) 02/08/2016  . Chronic combined systolic and diastolic CHF (congestive heart failure) ([place]) 02/08/2016  . Splenic infarct 01/06/2014  . Acute systolic CHF (congestive heart failure) ([place]) 08/03/2013  . Hx of right BKA ([place]) 07/26/2013  . Anemia 07/15/2013  . Hyperkalemia 07/15/2013  . Hyposmolality and/or hyponatremia 07/15/2013  . Acute respiratory failure ([place]) 07/15/2013  . Mitral regurgitation 07/15/2013  . Oliguria 07/15/2013  . Bacteremia due to Staphylococcus aureus 07/15/2013  . Hyperchloremia 07/15/2013  . Acute kidney failure ([place]) 07/14/2013  . Hypotension, unspecified 07/14/2013  . Acute systolic heart failure ([place]) 07/12/2013  . Acute pulmonary edema ([place]) 07/11/2013  . Pneumonia, organism unspecified(486) 07/11/2013  . Hypoxemia 07/11/2013  . Cardiomyopathy, ischemic  07/03/2013  . Coronary atherosclerosis of native coronary artery 07/01/2013  . NSTEMI (non-ST elevated myocardial infarction) ([place]) 06/26/2013  . AKI (acute kidney injury) ([place]) 06/25/2013  . Shock circulatory ([place]) 06/25/2013  . Dehydration with hyponatremia 06/25/2013  . Leukocytosis, unspecified 06/25/2013  . Sepsis(995.91) 06/25/2013  . Aftercare following surgery of the  circulatory system, [place] 02/11/2013  . Peripheral vascular disease, unspecified ([place]) 12/31/2012  . Diabetes mellitus out of control ([place]) 12/17/2012  . Essential hypertension, benign 12/17/2012  . Atherosclerotic peripheral vascular disease ([place]) 12/17/2012  . Hypercholesteremia    Past Medical History:  Diagnosis Date  . Allergic rhinitis   . Anemia    takes supplement  . Chronic cough   . Chronic sinusitis   . Complication of anesthesia    couldn't swallow and talk  . Coronary artery disease   . CVA (cerebral infarction) 2011   R hand deficit  . Diabetes mellitus without complication ([place]) ~2841   last HbA1c ~9 .... fasting 160s  . GERD (gastroesophageal reflux disease)   . Hypercholesteremia   . Hypertension   . Myocardial infarction ([place])   . Osteomyelitis ([place])    first and third metatarsal  . Pneumonia    hx of walking  . Splenic infarct    setting of lupus anticoagulant  . Stroke [facility])    right arm weakness    Family History  Problem Relation Age of Onset  . Pancreatic cancer Mother   . Diabetes Mother   . Hyperlipidemia Mother   . Diabetes Unknown   . Breast cancer Sister   . Depression Maternal Grandmother   . Heart disease Maternal Grandmother   . Depression Maternal Grandfather   . Heart disease Maternal Grandfather   . Depression Paternal Grandmother   . Heart disease Paternal Grandmother   . Depression Paternal Grandfather   . Heart disease Paternal Grandfather     Past Surgical History:  Procedure Laterality Date  . 4th Toe Amputation Left Jan. 2014   4th toe in [place]  . AMPUTATION Left 12/08/2012   Procedure: AMPUTATION DIGIT;  Surgeon: Mcarthur Rossetti, MD;  Location: [place];  Service: Orthopedics;  Laterality: Left;  3rd toe amputation possible 5th toe amputation  . AMPUTATION Left 12/11/2012   Procedure: Amputation of 2nd Toe;  Surgeon: Mcarthur Rossetti, MD;  Location: [place];  Service: Orthopedics;  Laterality: Left;  . AMPUTATION  Right 06/29/2013   Procedure: AMPUTATION BELOW KNEE;  Surgeon: Newt Minion, MD;  Location: [place];  Service: Orthopedics;  Laterality: Right;  . AMPUTATION Left 08/29/2014   Procedure: Left Great Toe Amputation at MTP;  Surgeon: Newt Minion, MD;  Location: [place];  Service: Orthopedics;  Laterality: Left;  . AMPUTATION Left 10/31/2014   Procedure: Midfoot Amputation;  Surgeon: Newt Minion, MD;  Location: [place];  Service: Orthopedics;  Laterality: Left;  . BYPASS GRAFT POPLITEAL TO TIBIAL Left 12/13/2012   Procedure: BYPASS GRAFT POPLITEAL TO TIBIAL;  Surgeon: Serafina Mitchell, MD;  [facility];  Service: Vascular;  Laterality: Left;  Left Popliteal to Posterior Tibial Bypass Graft with reversed saphenous vein graft  . CARDIAC CATHETERIZATION    . COLONOSCOPY [name] 04/14/2017   Procedure: COLONOSCOPY;  Surgeon: Danie Binder, MD;  [facility];  Service: Endoscopy;  Laterality: [name];  1000  . I&D EXTREMITY Left 12/08/2012   Procedure: IRRIGATION AND DEBRIDEMENT EXTREMITY;  Surgeon: Mcarthur Rossetti, MD;  Location: [place];  Service: Orthopedics;  Laterality: Left;  . I&D EXTREMITY Left 12/11/2012   Procedure: REPEAT I&D LEFT FOOT;  Surgeon: Mcarthur Rossetti, MD;  Location: [place];  Service: Orthopedics;  Laterality: Left;  . INCISION AND DRAINAGE Right 06/25/2013   Procedure: INCISION AND DRAINAGE RIGHT FOOT;  Surgeon: Mcarthur Rossetti, MD;  [facility];  Service: Orthopedics;  Laterality: Right;  . LEFT HEART CATHETERIZATION WITH CORONARY ANGIOGRAM [name] 06/28/2013   Procedure: LEFT HEART CATHETERIZATION WITH CORONARY ANGIOGRAM;  Surgeon: Burnell Blanks, MD;  Location: [facility] CATH LAB;  Service: Cardiovascular;  Laterality: [name];  . PENILE PROSTHESIS IMPLANT    . POLYPECTOMY  04/14/2017   Procedure: POLYPECTOMY;  Surgeon: Danie Binder, MD;  [facility];  Service: Endoscopy;;  colon  . STUMP REVISION Right 08/16/2013   Procedure: STUMP REVISION- right  Below Knee Amputation;  Surgeon: Newt Minion, MD;  Location: [place];  Service: Orthopedics;  Laterality: Right;  . TEE WITHOUT CARDIOVERSION [name] 07/03/2013   Procedure: TRANSESOPHAGEAL ECHOCARDIOGRAM (TEE);  Surgeon: Larey Dresser, MD;  Location: [place];  Service: Cardiovascular;  Laterality: [name];

## 2017-10-12 ENCOUNTER — Ambulatory Visit (INDEPENDENT_AMBULATORY_CARE_PROVIDER_SITE_OTHER): Payer: Medicare Other | Admitting: Family

## 2017-10-12 DIAGNOSIS — D509 Iron deficiency anemia, unspecified: Secondary | ICD-10-CM | POA: Diagnosis not present

## 2017-10-12 DIAGNOSIS — I10 Essential (primary) hypertension: Secondary | ICD-10-CM | POA: Diagnosis not present

## 2017-10-12 DIAGNOSIS — E1165 Type 2 diabetes mellitus with hyperglycemia: Secondary | ICD-10-CM | POA: Diagnosis not present

## 2017-10-12 DIAGNOSIS — E876 Hypokalemia: Secondary | ICD-10-CM | POA: Diagnosis not present

## 2017-10-13 ENCOUNTER — Ambulatory Visit (INDEPENDENT_AMBULATORY_CARE_PROVIDER_SITE_OTHER): Payer: Medicare Other | Admitting: Family

## 2017-10-16 DIAGNOSIS — I2510 Atherosclerotic heart disease of native coronary artery without angina pectoris: Secondary | ICD-10-CM | POA: Diagnosis not present

## 2017-10-16 DIAGNOSIS — E1165 Type 2 diabetes mellitus with hyperglycemia: Secondary | ICD-10-CM | POA: Diagnosis not present

## 2017-10-16 DIAGNOSIS — Z0001 Encounter for general adult medical examination with abnormal findings: Secondary | ICD-10-CM | POA: Diagnosis not present

## 2017-10-19 ENCOUNTER — Ambulatory Visit (INDEPENDENT_AMBULATORY_CARE_PROVIDER_SITE_OTHER): Payer: Medicare Other | Admitting: Orthopedic Surgery

## 2017-10-19 ENCOUNTER — Encounter (INDEPENDENT_AMBULATORY_CARE_PROVIDER_SITE_OTHER): Payer: Self-pay | Admitting: Family

## 2017-10-19 DIAGNOSIS — L97421 Non-pressure chronic ulcer of left heel and midfoot limited to breakdown of skin: Secondary | ICD-10-CM

## 2017-10-19 DIAGNOSIS — Z89432 Acquired absence of left foot: Secondary | ICD-10-CM

## 2017-10-19 DIAGNOSIS — S88111A Complete traumatic amputation at level between knee and ankle, right lower leg, initial encounter: Secondary | ICD-10-CM

## 2017-10-19 NOTE — Progress Notes (Signed)
Office Visit Note   Patient: Albert [name]           Date of Birth: 11-14-1960           MRN: [id number] Visit Date: 10/19/2017              Requested by: Celene Squibb, MD [place], [place] [address] PCP: Celene Squibb, MD  Chief Complaint  Patient presents with  . Right Foot - Wound Check      HPI: Patient is a 57 year old gentleman status post transtibial amputation the right with a loose fitting prosthesis as well as a recurrent ulceration left transmetatarsal amputation.  Patient states he noticed blood draining from the wound for about a week.  Assessment & Plan: Visit Diagnoses:  1. Midfoot ulcer, left, limited to breakdown of skin ([place])   2. History of transmetatarsal amputation of left foot ([place])   3. Amputated below knee, right ([place])     Plan: The ulcer was debrided of skin and soft tissue back to healthy viable tissue a dry dressing was applied on the left.  Patient will need to follow-up with biotech for new prosthetic supplies and may require a new socket.  Reevaluate in 2 weeks.  Discussed the importance of protected weightbearing on the left.  Patient states that his insert on the left is new.  Examination patient has an antalgic gait he has a loose fitting prosthesis on the right.  Follow-Up Instructions: Return in about 2 weeks (around 11/02/2017).   Ortho Exam  Patient is alert, oriented, no adenopathy, well-dressed, normal affect, normal respiratory effort. Left foot has a good dorsalis pedis pulse his foot is plantigrade no Achilles contracture.  He has a large ulcer on the residual limb of the transmetatarsal amputation of the left foot.  After informed consent a 10 blade knife was used to debride skin and soft tissue back to healthy viable granulation tissue this was touched with silver nitrate.  The ulcer is 2 x 3 cm in diameter and 3 mm deep.  There is good healthy tissue no exposed bone or tendon no drainage no odor no signs of  infection.  Imaging: No results found. No images are attached to the encounter.  Labs: Lab Results  Component Value Date   HGBA1C 9.1 (H) 02/08/2016   HGBA1C 8.2 (H) 05/16/2015   HGBA1C 8.2 (H) 06/25/2013   REPTSTATUS 02/13/2016 FINAL 02/08/2016   CULT NO GROWTH 5 DAYS 02/08/2016   LABORGA STAPHYLOCOCCUS AUREUS 06/26/2013    @LABSALLVALUES (HGBA1)@  There is no height or weight on file to calculate BMI.  Orders:  No orders of the defined types were placed in this encounter.  No orders of the defined types were placed in this encounter.    Procedures: No procedures performed  Clinical Data: No additional findings.  ROS:  All other systems negative, except as noted in the HPI. Review of Systems  Objective: Vital Signs: There were no vitals taken for this visit.  Specialty Comments:  No specialty comments available.  PMFS History: Patient Active Problem List   Diagnosis Date Noted  . Amputated below knee, right ([place]) 10/19/2017  . History of transmetatarsal amputation of left foot ([place]) 06/12/2017  . Special screening for malignant neoplasms, colon   . Midfoot ulcer, left, limited to breakdown of skin ([place]) 11/18/2016  . ARF (acute renal failure) ([place]) 02/08/2016  . Chronic combined systolic and diastolic CHF (congestive heart failure) ([place]) 02/08/2016  . Splenic  infarct 01/06/2014  . Acute systolic CHF (congestive heart failure) ([place]) 08/03/2013  . Hx of right BKA ([place]) 07/26/2013  . Anemia 07/15/2013  . Hyperkalemia 07/15/2013  . Hyposmolality and/or hyponatremia 07/15/2013  . Acute respiratory failure ([place]) 07/15/2013  . Mitral regurgitation 07/15/2013  . Oliguria 07/15/2013  . Bacteremia due to Staphylococcus aureus 07/15/2013  . Hyperchloremia 07/15/2013  . Acute kidney failure ([place]) 07/14/2013  . Hypotension, unspecified 07/14/2013  . Acute systolic heart failure ([place]) 07/12/2013  . Acute pulmonary edema ([place]) 07/11/2013  . Pneumonia, organism  unspecified(486) 07/11/2013  . Hypoxemia 07/11/2013  . Cardiomyopathy, ischemic 07/03/2013  . Coronary atherosclerosis of native coronary artery 07/01/2013  . NSTEMI (non-ST elevated myocardial infarction) ([place]) 06/26/2013  . AKI (acute kidney injury) ([place]) 06/25/2013  . Shock circulatory ([place]) 06/25/2013  . Dehydration with hyponatremia 06/25/2013  . Leukocytosis, unspecified 06/25/2013  . Sepsis(995.91) 06/25/2013  . Aftercare following surgery of the circulatory system, [place] 02/11/2013  . Peripheral vascular disease, unspecified ([place]) 12/31/2012  . Diabetes mellitus out of control ([place]) 12/17/2012  . Essential hypertension, benign 12/17/2012  . Atherosclerotic peripheral vascular disease ([place]) 12/17/2012  . Hypercholesteremia    Past Medical History:  Diagnosis Date  . Allergic rhinitis   . Anemia    takes supplement  . Chronic cough   . Chronic sinusitis   . Complication of anesthesia    couldn't swallow and talk  . Coronary artery disease   . CVA (cerebral infarction) 2011   R hand deficit  . Diabetes mellitus without complication ([place]) ~8756   last HbA1c ~9 .... fasting 160s  . GERD (gastroesophageal reflux disease)   . Hypercholesteremia   . Hypertension   . Myocardial infarction ([place])   . Osteomyelitis ([place])    first and third metatarsal  . Pneumonia    hx of walking  . Splenic infarct    setting of lupus anticoagulant  . Stroke [facility])    right arm weakness    Family History  Problem Relation Age of Onset  . Pancreatic cancer Mother   . Diabetes Mother   . Hyperlipidemia Mother   . Diabetes Unknown   . Breast cancer Sister   . Depression Maternal Grandmother   . Heart disease Maternal Grandmother   . Depression Maternal Grandfather   . Heart disease Maternal Grandfather   . Depression Paternal Grandmother   . Heart disease Paternal Grandmother   . Depression Paternal Grandfather   . Heart disease Paternal Grandfather     Past Surgical History:   Procedure Laterality Date  . 4th Toe Amputation Left Jan. 2014   4th toe in [place]  . AMPUTATION Left 12/08/2012   Procedure: AMPUTATION DIGIT;  Surgeon: Mcarthur Rossetti, MD;  Location: [facility];  Service: Orthopedics;  Laterality: Left;  3rd toe amputation possible 5th toe amputation  . AMPUTATION Left 12/11/2012   Procedure: Amputation of 2nd Toe;  Surgeon: Mcarthur Rossetti, MD;  Location: [place];  Service: Orthopedics;  Laterality: Left;  . AMPUTATION Right 06/29/2013   Procedure: AMPUTATION BELOW KNEE;  Surgeon: Newt Minion, MD;  Location: [facility];  Service: Orthopedics;  Laterality: Right;  . AMPUTATION Left 08/29/2014   Procedure: Left Great Toe Amputation at MTP;  Surgeon: Newt Minion, MD;  Location: [place];  Service: Orthopedics;  Laterality: Left;  . AMPUTATION Left 10/31/2014   Procedure: Midfoot Amputation;  Surgeon: Newt Minion, MD;  Location: [facility];  Service: Orthopedics;  Laterality: Left;  . BYPASS GRAFT  POPLITEAL TO TIBIAL Left 12/13/2012   Procedure: BYPASS GRAFT POPLITEAL TO TIBIAL;  Surgeon: Serafina Mitchell, MD;  [facility];  Service: Vascular;  Laterality: Left;  Left Popliteal to Posterior Tibial Bypass Graft with reversed saphenous vein graft  . CARDIAC CATHETERIZATION    . COLONOSCOPY [name] 04/14/2017   Procedure: COLONOSCOPY;  Surgeon: Danie Binder, MD;  [facility];  Service: Endoscopy;  Laterality: [name];  1000  . I&D EXTREMITY Left 12/08/2012   Procedure: IRRIGATION AND DEBRIDEMENT EXTREMITY;  Surgeon: Mcarthur Rossetti, MD;  Location: [facility];  Service: Orthopedics;  Laterality: Left;  . I&D EXTREMITY Left 12/11/2012   Procedure: REPEAT I&D LEFT FOOT;  Surgeon: Mcarthur Rossetti, MD;  Location: [facility];  Service: Orthopedics;  Laterality: Left;  . INCISION AND DRAINAGE Right 06/25/2013   Procedure: INCISION AND DRAINAGE RIGHT FOOT;  Surgeon: Mcarthur Rossetti, MD;  Location: [facility];  Service: Orthopedics;  Laterality: Right;  . LEFT  HEART CATHETERIZATION WITH CORONARY ANGIOGRAM [name] 06/28/2013   Procedure: LEFT HEART CATHETERIZATION WITH CORONARY ANGIOGRAM;  Surgeon: Burnell Blanks, MD;  Location: [facility] C[facility];  Service: Cardiovascular;  Laterality: [name];  . PENILE PROSTHESIS IMPLANT    . POLYPECTOMY  04/14/2017   Procedure: POLYPECTOMY;  Surgeon: Danie Binder, MD;  [facility];  Service: Endoscopy;;  colon  . STUMP REVISION Right 08/16/2013   Procedure: STUMP REVISION- right Below Knee Amputation;  Surgeon: Newt Minion, MD;  Location: [facility];  Service: Orthopedics;  Laterality: Right;  . TEE WITHOUT CARDIOVERSION [name] 07/03/2013   Procedure: TRANSESOPHAGEAL ECHOCARDIOGRAM (TEE);  Surgeon: Larey Dresser, MD;  Location: [facility];  Service: Cardiovascular;  Laterality: [name];  . TONSILLECTOMY AND ADENOIDECTOMY     Social History   Occupational History  . 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 Smoking status: Never Smoker  . Smokeless tobacco: Never Used  Substance and Sexual Activity  . Alcohol use: No    Alcohol/week: 0.0 oz  . Drug use: No  . Sexual activity: No

## 2017-10-20 ENCOUNTER — Encounter: Payer: Self-pay | Admitting: *Deleted

## 2017-10-23 ENCOUNTER — Encounter: Payer: Self-pay | Admitting: Cardiology

## 2017-10-23 ENCOUNTER — Ambulatory Visit: Payer: Medicare Other | Admitting: Cardiology

## 2017-10-23 VITALS — BP 136/81 | HR 60 | Ht 71.0 in | Wt 274.0 lb

## 2017-10-23 DIAGNOSIS — I5022 Chronic systolic (congestive) heart failure: Secondary | ICD-10-CM | POA: Diagnosis not present

## 2017-10-23 DIAGNOSIS — E782 Mixed hyperlipidemia: Secondary | ICD-10-CM | POA: Diagnosis not present

## 2017-10-23 DIAGNOSIS — I340 Nonrheumatic mitral (valve) insufficiency: Secondary | ICD-10-CM

## 2017-10-23 DIAGNOSIS — I2510 Atherosclerotic heart disease of native coronary artery without angina pectoris: Secondary | ICD-10-CM

## 2017-10-23 NOTE — Patient Instructions (Signed)
Medication Instructions:  Your physician recommends that you continue on your current medications as directed. Please refer to the Current Medication list given to you today.  Labwork: NONE  Testing/Procedures: NONE  Follow-Up: Your physician wants you to follow-up in: 6 MONTHS WITH DR. BRANCH. You will receive a reminder letter in the mail two months in advance. If you don't receive a letter, please call our office to schedule the follow-up appointment.  Any Other Special Instructions Will Be Listed Below (If Applicable).  If you need a refill on your cardiac medications before your next appointment, please call your pharmacy. 

## 2017-10-23 NOTE — Progress Notes (Signed)
Clinical Summary Mr. Scialdone is a 57 y.o.male seen today for follow up of the following medical problems.   1. Chronic combined systolic/diastolic heart failure - ICM, LVEF 40-45% by echo 12/2013, grade II diastolic dysfunction - 01/1739 LVEF 81%, grade I diastolic dysfunction    - no recent SOB/DOE. Occasional ankle swelling,resolves with extra lasix - compliant with meds.   2. Mitral regurgitation - moderate by TEE 06/2013 and by TTE 12/2013 - 01/2017 only trival MR.  - he denies any recent symptoms.   3. CAD - hx of demand ischemia 06/2013 in setting of sepsis with necrotic foot wound. - cath at that time 06/2013 LM patent, LAD 40% prox, 70% mid follow by 60% lesion, distal 90%. Diag 99%. LCX 80-90% prox, OM1 80%, OM2 99%, OM3 99 % (all OMs are small), RCA small non-dom mid 99%. LVEF 40% by LVgram.  - CAD was managed medically after discussions with CT surgery at that time, with plans to reevaluate once recurring infection issues resolved.   - denies any chest pain - most recently has not been interested in reconsidering surgery  4. PAD - followed by vascular and [facility] - previous right BKA, previous multiple toe amputations. 08/30/14 treated for osteo and ulcer of left great toe with amputation. Jan 2016 had left midfoot amputation.  - Followed by ortho Dr Sharol Given.   - reports left foot has healed.   5. Hyperlipidemia 09/2017 TC 94 TG 170 HDL 25 LDL 35 - compliant with meds  6. HTN - compliant with meds  7. Carotid stenosis - 03/2015 mild bilateral disease  Past Medical History:  Diagnosis Date  . Allergic rhinitis   . Anemia    takes supplement  . Chronic cough   . Chronic sinusitis   . Complication of anesthesia    couldn't swallow and talk  . Coronary artery disease   . CVA (cerebral infarction) 2011   R hand deficit  . Diabetes mellitus without complication ([place]) ~4481   last HbA1c ~9 .... fasting 160s  . GERD (gastroesophageal reflux  disease)   . Hypercholesteremia   . Hypertension   . Myocardial infarction ([place])   . Osteomyelitis ([place])    first and third metatarsal  . Pneumonia    hx of walking  . Splenic infarct    setting of lupus anticoagulant  . Stroke [facility])    right arm weakness     Allergies  Allergen Reactions  . Amoxicillin Nausea And Vomiting    Has patient had a PCN reaction causing immediate rash, facial/tongue/throat swelling, SOB or lightheadedness with hypotension: No Has patient had a PCN reaction causing severe rash involving mucus membranes or skin necrosis: No Has patient had a PCN reaction that required hospitalization: No Has patient had a PCN reaction occurring within the last 10 years: Yes If all of the above answers are "NO", then may proceed with Cephalosporin use.      Current Outpatient Medications  Medication Sig Dispense Refill  . aspirin EC 81 MG tablet Take 1 tablet (81 mg total) by mouth daily. 30 tablet 1  . atorvastatin (LIPITOR) 80 MG tablet Take 1 tablet (80 mg total) by mouth daily. (Patient taking differently: Take 80 mg by mouth every evening. ) 30 tablet 6  . carvedilol (COREG) 25 MG tablet Take 25 mg by mouth 2 (two) times daily.     . furosemide (LASIX) 80 MG tablet Take 80 mg by mouth daily.    Marland Kitchen  HUMALOG KWIKPEN 100 UNIT/ML KiwkPen Inject 22 Units into the skin 3 (three) times daily before meals.     . hydrALAZINE (APRESOLINE) 10 MG tablet Take 1 tablet (10 mg total) by mouth 3 (three) times daily. 120 tablet 1  . LEVEMIR FLEXTOUCH 100 UNIT/ML Pen Inject 52 Units into the skin at bedtime.   5  . losartan (COZAAR) 50 MG tablet TAKE 1 TABLET BY MOUTH  DAILY 90 tablet 3  . mupirocin ointment (BACTROBAN) 2 % Apply 1 application topically daily. (Patient taking differently: Apply 1 application topically daily. Apply to left foot) 22 g 6  . naproxen sodium (ANAPROX) 220 MG tablet Take 220 mg by mouth daily as needed (pain).    . nitroGLYCERIN (NITRODUR - DOSED IN MG/24  HR) 0.2 mg/hr patch Place 0.2 mg onto the skin every 3 (three) days. Applies to foot/ankle area to increase blood flow    . pantoprazole (PROTONIX) 40 MG tablet Take 1 tablet (40 mg total) by mouth daily. 30 tablet 1  . polyethylene glycol (MIRALAX / GLYCOLAX) packet Take 17 g by mouth daily as needed for mild constipation.     . potassium chloride (K-DUR,KLOR-CON) 10 MEQ tablet Take 1 tablet (10 mEq total) by mouth daily. 30 tablet 6  . promethazine (PHENERGAN) 25 MG tablet Take 1 tab every 6-8 hours for nausea. (Patient taking differently: Take 25 mg by mouth every 6 (six) hours as needed for nausea or vomiting. ) 40 tablet 1  . senna-docusate (SENOKOT-S) 8.6-50 MG per tablet Take 1 tablet by mouth daily as needed for mild constipation.      No current facility-administered medications for this visit.      Past Surgical History:  Procedure Laterality Date  . 4th Toe Amputation Left Jan. 2014   4th toe in [place]  . AMPUTATION Left 12/08/2012   Procedure: AMPUTATION DIGIT;  Surgeon: Mcarthur Rossetti, MD;  Location: [place];  Service: Orthopedics;  Laterality: Left;  3rd toe amputation possible 5th toe amputation  . AMPUTATION Left 12/11/2012   Procedure: Amputation of 2nd Toe;  Surgeon: Mcarthur Rossetti, MD;  Location: [place];  Service: Orthopedics;  Laterality: Left;  . AMPUTATION Right 06/29/2013   Procedure: AMPUTATION BELOW KNEE;  Surgeon: Newt Minion, MD;  Location: [place];  Service: Orthopedics;  Laterality: Right;  . AMPUTATION Left 08/29/2014   Procedure: Left Great Toe Amputation at MTP;  Surgeon: Newt Minion, MD;  Location: [place];  Service: Orthopedics;  Laterality: Left;  . AMPUTATION Left 10/31/2014   Procedure: Midfoot Amputation;  Surgeon: Newt Minion, MD;  Location: [place];  Service: Orthopedics;  Laterality: Left;  . BYPASS GRAFT POPLITEAL TO TIBIAL Left 12/13/2012   Procedure: BYPASS GRAFT POPLITEAL TO TIBIAL;  Surgeon: Serafina Mitchell, MD;  Location: [facility];  Service:  Vascular;  Laterality: Left;  Left Popliteal to Posterior Tibial Bypass Graft with reversed saphenous vein graft  . CARDIAC CATHETERIZATION    . COLONOSCOPY [name] 04/14/2017   Procedure: COLONOSCOPY;  Surgeon: Danie Binder, MD;  Location: [facility];  Service: Endoscopy;  Laterality: [name];  1000  . I&D EXTREMITY Left 12/08/2012   Procedure: IRRIGATION AND DEBRIDEMENT EXTREMITY;  Surgeon: Mcarthur Rossetti, MD;  Location: [place];  Service: Orthopedics;  Laterality: Left;  . I&D EXTREMITY Left 12/11/2012   Procedure: REPEAT I&D LEFT FOOT;  Surgeon: Mcarthur Rossetti, MD;  Location: [place];  Service: Orthopedics;  Laterality: Left;  . INCISION AND DRAINAGE  Right 06/25/2013   Procedure: INCISION AND DRAINAGE RIGHT FOOT;  Surgeon: Mcarthur Rossetti, MD;  Location: [facility];  Service: Orthopedics;  Laterality: Right;  . LEFT HEART CATHETERIZATION WITH CORONARY ANGIOGRAM [name] 06/28/2013   Procedure: LEFT HEART CATHETERIZATION WITH CORONARY ANGIOGRAM;  Surgeon: Burnell Blanks, MD;  Location[facility] C[facility];  Service: Cardiovascular;  Laterality: [name];  . PENILE PROSTHESIS IMPLANT    . POLYPECTOMY  04/14/2017   Procedure: POLYPECTOMY;  Surgeon: Danie Binder, MD;  [facility]: [facility];  Service: Endoscopy;;  colon  . STUMP REVISION Right 08/16/2013   Procedure: STUMP REVISION- right Below Knee Amputation;  Surgeon: Newt Minion, MD;  Location: [place];  Service: Orthopedics;  Laterality: Right;  . TEE WITHOUT CARDIOVERSION [name] 07/03/2013   Procedure: TRANSESOPHAGEAL ECHOCARDIOGRAM (TEE);  Surgeon: Larey Dresser, MD;  Location: [place];  Service: Cardiovascular;  Laterality: [name];  . TONSILLECTOMY AND ADENOIDECTOMY       Allergies  Allergen Reactions  . Amoxicillin Nausea And Vomiting    Has patient had a PCN reaction causing immediate rash, facial/tongue/throat swelling, SOB or lightheadedness with hypotension: No Has patient had a PCN reaction causing severe rash involving mucus  membranes or skin necrosis: No Has patient had a PCN reaction that required hospitalization: No Has patient had a PCN reaction occurring within the last 10 years: Yes If all of the above answers are "NO", then may proceed with Cephalosporin use.       Family History  Problem Relation Age of Onset  . Pancreatic cancer Mother   . Diabetes Mother   . Hyperlipidemia Mother   . Diabetes Unknown   . Breast cancer Sister   . Depression Maternal Grandmother   . Heart disease Maternal Grandmother   . Depression Maternal Grandfather   . Heart disease Maternal Grandfather   . Depression Paternal Grandmother   . Heart disease Paternal Grandmother   . Depression Paternal Grandfather   . Heart disease Paternal Grandfather      Social History Mr. Brazie reports that  has never smoked. he has never used smokeless tobacco. Mr. Ramirez reports that he does not drink alcohol.   Review of Systems CONSTITUTIONAL: No weight loss, fever, chills, weakness or fatigue.  HEENT: Eyes: No visual loss, blurred vision, double vision or yellow sclerae.No hearing loss, sneezing, congestion, runny nose or sore throat.  SKIN: No rash or itching.  CARDIOVASCULAR: per hpi RESPIRATORY: No shortness of breath, cough or sputum.  GASTROINTESTINAL: No anorexia, nausea, vomiting or diarrhea. No abdominal pain or blood.  GENITOURINARY: No burning on urination, no polyuria NEUROLOGICAL: No headache, dizziness, syncope, paralysis, ataxia, numbness or tingling in the extremities. No change in bowel or bladder control.  MUSCULOSKELETAL: No muscle, back pain, joint pain or stiffness.  LYMPHATICS: No enlarged nodes. No history of splenectomy.  PSYCHIATRIC: No history of depression or anxiety.  ENDOCRINOLOGIC: No reports of sweating, cold or heat intolerance. No polyuria or polydipsia.  Marland Kitchen   Physical Examination Vitals:   10/23/17 0953  BP: 136/81  Pulse: 60  SpO2: 98%   Vitals:   10/23/17 0953  Weight: 274 lb  (124.3 kg)  Height: 5\' 11"  (1.803 m)    Gen: resting comfortably, no acute distress HEENT: no scleral icterus, pupils equal round and reactive, no palptable cervical adenopathy,  CV: RRR, no m/r/g, no jvd Resp: Clear to auscultation bilaterally GI: abdomen is soft, non-tender, non-distended, normal bowel sounds, no hepatosplenomegaly MSK: extremities are warm, no edema.  Skin:  warm, no rash Neuro:  no focal deficits Psych: appropriate affect   Diagnostic Studies 06/2013 Cath Hemodynamic Findings: Central aortic pressure: 90/61 Left ventricular pressure: 90/19/29  Angiographic Findings:  Left main: Short segment without obstruction.   Left Anterior Descending Artery: Moderate caliber diffusely diseased vessel that courses to the apex. The proximal vessel has diffuse 40% stenosis. The mid vessel has a 70% stenosis followed by a 60% stenosis. The distal vessel has diffuse 90% stenosis. The diagonal is a moderate caliber vessel with ostial 99% stenosis with 99% disease extending into the mid vessel.   Circumflex Artery: Large caliber vessel with proximal 80-90% stenosis. There are three small to moderate caliber obtuse marginal branches. The first OM has mid 80% stenosis. The second OM has a proximal 99% stenosis. The third OM has diffuse 99% stenosis. The left sided posterolateral [place] has 99% stenosis.   Right Coronary Artery: Small non-dominant vessel with mid 99% stenosis.   Left Ventricular Angiogram: LVEF=40%  Impression: 1. Triple vessel CAD 2. NSTEMI secondary to demand ischemia from sepsis with severe underlying CAD 3. Mild LV systolic dysfunction  Recommendations: Complex situation. The only option for coronary revascularization is CABG. He is not a favorable candidate for CABG at this time with necrotic right foot, sepsis. Even though not ideal, would favor medical management of CAD while undergoing treatment of his necrotic foot. Would proceed with right BKA  this weekend. I have discussed the case with CT surgery who agrees that CABG is not a good option at this time. Will ask CT surgery to see after he recovers from his BKA. Continue ASA, beta blocker, statin.    Complications:None. The patient tolerated the procedure well.     11/2012 Carotid US Summary:  - No significant extracranial carotid artery stenosis demonstrated. Vertebrals are patent with antegrade flow. - ICA/CCA ratio= right = 0.94. left = 0.73. Other specific details can be found in the table(s) above.  Prepared and Electronically Authenticated by  03/2015 Carotid US IMPRESSION: Mild atherosclerotic disease in the carotid arteries, right side greater the left. The peak systolic velocity in the proximal right internal carotid artery is slightly elevated. Estimated degree of stenosis in the right internal carotid artery is close to 50%.  Estimated degree of stenosis in the left internal carotid artery is less than 50%.  Patent vertebral arteries.   12/07/15 Clinic EKG (performed and reviewed in clinic): NSR  01/2017 echo  Study Conclusions  - Left ventricle: The cavity size was normal. Wall thickness was at   the upper limits of normal. The estimated ejection fraction was   50%. Doppler parameters are consistent with abnormal left   ventricular relaxation (grade 1 diastolic dysfunction). - Aortic valve: Mildly calcified annulus. Trileaflet. - Mitral valve: Calcified annulus. Mildly thickened leaflets .   There was trivial regurgitation. - Tricuspid valve: There was trivial regurgitation. Peak RV-RA   gradient (S): 21 mm Hg. - Pulmonary arteries: Systolic pressure could not be accurately   estimated. - Pericardium, extracardiac: A trivial pericardial effusion was   identified.  Impressions:  - Overall limited images. Upper normal LV wall thickness with LVEF   approximately 50% based on best views. Cannot exclude periapical   wall  motion abnormalities. Would suggest a limited study with   Definity contrast. Calcified mitral annulus with mildly thickened   leaflets and trivial mitral regurgitation. Trivial tricuspid   regurgitation with RV-RA gradient 21 mmHg. Trivial pericardial   effusion.    Assessment and Plan  1. Chronic systolic heart failure - LVEF has improved over time and is currently low normal - he is asymptoamtic, continue current meds  2. CAD - triple vessel CAD managed medically. Seen by CT surgery, due to ongoing recurrent LE leg infections and multiple surgeries, as well as overall high risk recs were for medical therapy, reconsideration if other comorbidities significanlty improved - no recent symptoms. He is not in favor of considering CABG at this time. Given he has done well and has no symptoms reasonable to continue medical therapy at this time.   - continue current meds  3. Hyperlipidemia - LDL at goal, he is on high dose statin in setting of CAD and PAD. - continue current meds  4. PAD - per vascular and ortho  5. Mitral regurgitation - has improved with improved LV function consistent with functional MR, most recently trivial by echo - continue to monitor  6. HTN -he is  at goal, continue current meds      Arnoldo Lenis, M.D.

## 2017-10-25 DIAGNOSIS — Z135 Encounter for screening for eye and ear disorders: Secondary | ICD-10-CM | POA: Diagnosis not present

## 2017-10-26 ENCOUNTER — Encounter: Payer: Self-pay | Admitting: Cardiology

## 2017-10-31 ENCOUNTER — Ambulatory Visit: Payer: Medicare Other | Admitting: General Surgery

## 2017-10-31 VITALS — BP 123/83 | HR 80 | Temp 98.0°F | Resp 18 | Ht 71.0 in | Wt 270.0 lb

## 2017-10-31 DIAGNOSIS — L723 Sebaceous cyst: Secondary | ICD-10-CM | POA: Diagnosis not present

## 2017-11-01 ENCOUNTER — Encounter: Payer: Self-pay | Admitting: General Surgery

## 2017-11-01 NOTE — H&P (Addendum)
[facility] History and Physical  Reason for Referral: Cyst on neck  Referring Physician:  Dr. Wende Neighbors   Chief Complaint    Cyst      Albert [name] is a 57 y.o. male.  HPI: Mr. 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 The patient presented to his PCP for routine evaluation, and was noted to have a large cyst on the back right side of his neck that he reports has been there for over 10 years, and has grown slowly over that time. He reports that years ago the cyst was removed in the office of a dermatologist, but that this was very painful, and then the cyst grew back and has become progressively larger.  At the time of the initial removal, the cyst was inflamed and had ruptured per the patients report, but he denies any further symptoms of infection in the area and no drainage.   The patient was seen by his Cardiologist Dr. Harl Bowie last week, and no changes were made to his regimen. He had underwent an ECHO 01/2017 that had an improved EF compared to his previous ECHO.  He has a history of CAD and demand ischemia with known 4 vessel disease, and this has been managed medically for some time now given this history of diabetic foot infections that precluded any CT surgery.  The patient has not bee interested in pursing any cardiac surgery, and denies any chest pain or shortness or breath, and ambulates with a cane. He has had toe amputations since the discovery of his 4 vessel disease in 2014, and had general anesthesia with an LMA 10/2014.     He was accompanied today by his sister.   Past Medical History:  Diagnosis Date  . Allergic rhinitis   . Anemia    takes supplement  . Chronic cough   . Chronic sinusitis   . Complication of anesthesia    couldn't swallow and talk  . Coronary  artery disease   . CVA (cerebral infarction) 2011   R hand deficit  . Diabetes mellitus without complication ([place]) ~7628   last HbA1c ~9 .... fasting 160s  . GERD (gastroesophageal reflux disease)   . Hypercholesteremia   . Hypertension   . Myocardial infarction ([place])   . Osteomyelitis ([place])    first and third metatarsal  . Pneumonia    hx of walking  . Splenic infarct    setting of lupus anticoagulant  . Stroke [facility])    right arm weakness    Past Surgical History:  Procedure Laterality Date  . 4th Toe Amputation Left Jan. 2014   4th toe in [place]  . AMPUTATION Left 12/08/2012   Procedure: AMPUTATION DIGIT;  Surgeon: Mcarthur Rossetti, MD;  Location: [place];  Service: Orthopedics;  Laterality: Left;  3rd toe amputation possible 5th toe amputation  . AMPUTATION Left 12/11/2012   Procedure: Amputation of 2nd Toe;  Surgeon: Mcarthur Rossetti, MD;  Location: [place];  Service: Orthopedics;  Laterality: Left;  . AMPUTATION Right 06/29/2013   Procedure: AMPUTATION BELOW KNEE;  Surgeon: Newt Minion, MD;  Location: [place];  Service: Orthopedics;  Laterality: Right;  . AMPUTATION Left 08/29/2014   Procedure: Left Great Toe Amputation at MTP;  Surgeon: Beverely Low  Fernanda Drum, MD;  Location: [place];  Service: Orthopedics;  Laterality: Left;  . AMPUTATION Left 10/31/2014   Procedure: Midfoot Amputation;  Surgeon: Newt Minion, MD;  Location: [place];  Service: Orthopedics;  Laterality: Left;  . BYPASS GRAFT POPLITEAL TO TIBIAL Left 12/13/2012   Procedure: BYPASS GRAFT POPLITEAL TO TIBIAL;  Surgeon: Serafina Mitchell, MD;  [facility];  Service: Vascular;  Laterality: Left;  Left Popliteal to Posterior Tibial Bypass Graft with reversed saphenous vein graft  . CARDIAC CATHETERIZATION    . COLONOSCOPY [name] 04/14/2017   Procedure: COLONOSCOPY;  Surgeon: Danie Binder, MD;  [facility];  Service: Endoscopy;  Laterality: [name];  1000  . I&D EXTREMITY Left 12/08/2012   Procedure: IRRIGATION AND  DEBRIDEMENT EXTREMITY;  Surgeon: Mcarthur Rossetti, MD;  Location: [place];  Service: Orthopedics;  Laterality: Left;  . I&D EXTREMITY Left 12/11/2012   Procedure: REPEAT I&D LEFT FOOT;  Surgeon: Mcarthur Rossetti, MD;  Location: [place];  Service: Orthopedics;  Laterality: Left;  . INCISION AND DRAINAGE Right 06/25/2013   Procedure: INCISION AND DRAINAGE RIGHT FOOT;  Surgeon: Mcarthur Rossetti, MD;  [facility];  Service: Orthopedics;  Laterality: Right;  . LEFT HEART CATHETERIZATION WITH CORONARY ANGIOGRAM [name] 06/28/2013   Procedure: LEFT HEART CATHETERIZATION WITH CORONARY ANGIOGRAM;  Surgeon: Burnell Blanks, MD;  Location: [facility];  Service: Cardiovascular;  Laterality: [name];  . PENILE PROSTHESIS IMPLANT    . POLYPECTOMY  04/14/2017   Procedure: POLYPECTOMY;  Surgeon: Danie Binder, MD;  [facility];  Service: Endoscopy;;  colon  . STUMP REVISION Right 08/16/2013   Procedure: STUMP REVISION- right Below Knee Amputation;  Surgeon: Newt Minion, MD;  Location: [place];  Service: Orthopedics;  Laterality: Right;  . TEE WITHOUT CARDIOVERSION [name] 07/03/2013   Procedure: TRANSESOPHAGEAL ECHOCARDIOGRAM (TEE);  Surgeon: Larey Dresser, MD;  Location: [place];  Service: Cardiovascular;  Laterality: [name];  . TONSILLECTOMY AND ADENOIDECTOMY      Family History  Problem Relation Age of Onset  . Pancreatic cancer Mother   . Diabetes Mother   . Hyperlipidemia Mother   . Diabetes Unknown   . Breast cancer Sister   . Depression Maternal Grandmother   . Heart disease Maternal Grandmother   . Depression Maternal Grandfather   . Heart disease Maternal Grandfather   . Depression Paternal Grandmother   . Heart disease Paternal Grandmother   . Depression Paternal Grandfather   . Heart disease Paternal Grandfather     Social History   Tobacco Use  . Smoking status: Never Smoker  . Smokeless tobacco: Never Used  Substance Use Topics  . Alcohol use: No     Alcohol/week: 0.0 oz  . Drug use: No    Medications: I have reviewed the patient's current medications. Allergies as of 10/31/2017      Reactions   Amoxicillin Nausea And Vomiting   Has patient had a PCN reaction causing immediate rash, facial/tongue/throat swelling, SOB or lightheadedness with hypotension: No Has patient had a PCN reaction causing severe rash involving mucus membranes or skin necrosis: No Has patient had a PCN reaction that required hospitalization: No Has patient had a PCN reaction occurring within the last 10 years: Yes If all of the above answers are "NO", then may proceed with Cephalosporin use.      Medication List        Accurate as of 10/31/17 11:59 PM. Always use your most recent  med list.          aspirin EC 81 MG tablet Take 1 tablet (81 mg total) by mouth daily.   atorvastatin 80 MG tablet Commonly known as:  LIPITOR Take 1 tablet (80 mg total) by mouth daily.   carvedilol 25 MG tablet Commonly known as:  COREG Take 25 mg by mouth 2 (two) times daily.   furosemide 80 MG tablet Commonly known as:  LASIX Take 80 mg by mouth daily.   HUMALOG KWIKPEN 100 UNIT/ML KiwkPen Generic drug:  insulin lispro Inject 22 Units into the skin 3 (three) times daily before meals.   hydrALAZINE 10 MG tablet Commonly known as:  APRESOLINE Take 1 tablet (10 mg total) by mouth 3 (three) times daily.   LEVEMIR FLEXTOUCH 100 UNIT/ML Pen Generic drug:  Insulin Detemir Inject 52 Units into the skin at bedtime.   losartan 50 MG tablet Commonly known as:  COZAAR TAKE 1 TABLET BY MOUTH  DAILY   mupirocin ointment 2 % Commonly known as:  BACTROBAN Apply 1 application topically daily.   naproxen sodium 220 MG tablet Commonly known as:  ALEVE Take 220 mg by mouth daily as needed (pain).   nitroGLYCERIN 0.2 mg/hr patch Commonly known as:  NITRODUR - Dosed in mg/24 hr Place 0.2 mg onto the skin every 3 (three) days. Applies to foot/ankle area to increase blood  flow   pantoprazole 40 MG tablet Commonly known as:  PROTONIX Take 1 tablet (40 mg total) by mouth daily.   polyethylene glycol packet Commonly known as:  MIRALAX / GLYCOLAX Take 17 g by mouth daily as needed for mild constipation.   potassium chloride 10 MEQ tablet Commonly known as:  K-DUR,KLOR-CON Take 1 tablet (10 mEq total) by mouth daily.   promethazine 25 MG tablet Commonly known as:  PHENERGAN Take 1 tab every 6-8 hours for nausea.   senna-docusate 8.6-50 MG tablet Commonly known as:  Senokot-S Take 1 tablet by mouth daily as needed for mild constipation.       ROS:  A comprehensive review of systems was negative except for: Ears, nose, mouth, throat, and face: positive for sinusitis Cardiovascular: positive for HTN Cyst on back of neck  Blood pressure 123/83, pulse 80, temperature 98 F (36.7 C), resp. rate 18, height 5\' 11"  (1.803 m), weight 270 lb (122.5 kg). Physical Exam  Constitutional: He is oriented to person, place, and time and well-developed, well-nourished, and in no distress.  HENT:  Head: Normocephalic.  Right posterior neck (right of midline), 4cm, mobile and fluctuant mass consistent with cyst, no erythema or drainage  Eyes: Pupils are equal, round, and reactive to light.  Neck: Normal range of motion.  Cardiovascular: Normal rate and regular rhythm.  Pulmonary/Chest: Effort normal and breath sounds normal.  Abdominal: Soft. He exhibits no distension. There is no tenderness.  Musculoskeletal:  Right arm weakness, right BKA with prosthesis in place  Neurological: He is alert and oriented to person, place, and time.  Skin: Skin is warm and dry.  Psychiatric: Mood, memory, affect and judgment normal.  Vitals reviewed.   Results: None   Assessment & Plan:  KAIDE GAGE is a 57 y.o. male with a large recurrent cyst on his neck, likely an epidermoid/ sebaceous type cyst given his prior description. It is right of midline and mobile, making  any thing neurologic less likely.  The area is not tender, but he is annoyed by people continuously asking him about it, and telling him  he needs to get it checked out.  He wants to get it removed, and does not want to undergo a local procedure in the office like previously.   -OR for sebaceous cyst excision, will be able to have the patient on his left side in order to access  -Discussed using local anesthesia, and that anesthesia would determine the best form of anesthesia for him given him history and risk, etc but will likely be able to just have some sedation  -The patient has recently seen Dr. Harl Bowie and has no cardiac symptoms. I will notify Dr. Harl Bowie of the plans pending any concern from him with proceeding with the excision     All questions were answered to the satisfaction of the patient and family.  The risk and benefits of sebaceous cyst removal were discussed including but not limited to bleeding, infection, inability to remove the complete cyst, recurrence, rupture.  We also discussed the possibility that this is a lipoma versus a mass or a lymph node, and that it could be a cancer, but that we would not know until the final pathology.  He understands that if it were a cancer, then more surgery/ investigation would have to be performed following the removal of the cyst/ mass.  After careful consideration, [facility] has decided to proceed.    Virl Cagey 11/01/2017, 11:46 AM

## 2017-11-01 NOTE — Progress Notes (Addendum)
[facility] History and Physical  Reason for Referral: Cyst on neck  Referring Physician:  Dr. Wende Neighbors   Chief Complaint    Cyst      Albert [name] is a 57 y.o. male.  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 The patient presented to his PCP for routine evaluation, and was noted to have a large cyst on the back right side of his neck that he reports has been there for over 10 years, and has grown slowly over that time. He reports that years ago the cyst was removed in the office of a dermatologist, but that this was very painful, and then the cyst grew back and has become progressively larger.  At the time of the initial removal, the cyst was inflamed and had ruptured per the patients report, but he denies any further symptoms of infection in the area and no drainage.   The patient was seen by his Cardiologist Dr. Harl Bowie last week, and no changes were made to his regimen. He had underwent an ECHO 01/2017 that had an improved EF compared to his previous ECHO.  He has a history of CAD and demand ischemia with known 4 vessel disease, and this has been managed medically for some time now given this history of diabetic foot infections that precluded any CT surgery.  The patient has not bee interested in pursing any cardiac surgery, and denies any chest pain or shortness or breath, and ambulates with a cane. He has had toe amputations since the discovery of his 4 vessel disease in 2014, and had general anesthesia with an LMA 10/2014.     He was accompanied today by his sister.   Past Medical History:  Diagnosis Date  . Allergic rhinitis   . Anemia    takes supplement  . Chronic cough   . Chronic sinusitis   . Complication of anesthesia    couldn't swallow and talk  . Coronary  artery disease   . CVA (cerebral infarction) 2011   R hand deficit  . Diabetes mellitus without complication ([place]) ~3716   last HbA1c ~9 .... fasting 160s  . GERD (gastroesophageal reflux disease)   . Hypercholesteremia   . Hypertension   . Myocardial infarction ([place])   . Osteomyelitis ([place])    first and third metatarsal  . Pneumonia    hx of walking  . Splenic infarct    setting of lupus anticoagulant  . Stroke [facility])    right arm weakness    Past Surgical History:  Procedure Laterality Date  . 4th Toe Amputation Left Jan. 2014   4th toe in [place]  . AMPUTATION Left 12/08/2012   Procedure: AMPUTATION DIGIT;  Surgeon: Mcarthur Rossetti, MD;  Location: [place];  Service: Orthopedics;  Laterality: Left;  3rd toe amputation possible 5th toe amputation  . AMPUTATION Left 12/11/2012   Procedure: Amputation of 2nd Toe;  Surgeon: Mcarthur Rossetti, MD;  Location: [place];  Service: Orthopedics;  Laterality: Left;  . AMPUTATION Right 06/29/2013   Procedure: AMPUTATION BELOW KNEE;  Surgeon: Newt Minion, MD;  Location: [place];  Service: Orthopedics;  Laterality: Right;  . AMPUTATION Left 08/29/2014   Procedure: Left Great Toe Amputation at MTP;  Surgeon: Beverely Low  Fernanda Drum, MD;  Location: [place];  Service: Orthopedics;  Laterality: Left;  . AMPUTATION Left 10/31/2014   Procedure: Midfoot Amputation;  Surgeon: Newt Minion, MD;  Location: [place];  Service: Orthopedics;  Laterality: Left;  . BYPASS GRAFT POPLITEAL TO TIBIAL Left 12/13/2012   Procedure: BYPASS GRAFT POPLITEAL TO TIBIAL;  Surgeon: Serafina Mitchell, MD;  [facility];  Service: Vascular;  Laterality: Left;  Left Popliteal to Posterior Tibial Bypass Graft with reversed saphenous vein graft  . CARDIAC CATHETERIZATION    . COLONOSCOPY [name] 04/14/2017   Procedure: COLONOSCOPY;  Surgeon: Danie Binder, MD;  [facility];  Service: Endoscopy;  Laterality: [name];  1000  . I&D EXTREMITY Left 12/08/2012   Procedure: IRRIGATION AND  DEBRIDEMENT EXTREMITY;  Surgeon: Mcarthur Rossetti, MD;  Location: [place];  Service: Orthopedics;  Laterality: Left;  . I&D EXTREMITY Left 12/11/2012   Procedure: REPEAT I&D LEFT FOOT;  Surgeon: Mcarthur Rossetti, MD;  Location: [place];  Service: Orthopedics;  Laterality: Left;  . INCISION AND DRAINAGE Right 06/25/2013   Procedure: INCISION AND DRAINAGE RIGHT FOOT;  Surgeon: Mcarthur Rossetti, MD;  [facility];  Service: Orthopedics;  Laterality: Right;  . LEFT HEART CATHETERIZATION WITH CORONARY ANGIOGRAM [name] 06/28/2013   Procedure: LEFT HEART CATHETERIZATION WITH CORONARY ANGIOGRAM;  Surgeon: Burn[place] Blanks, MD;  Location: [facility] C[facility];  Service: Cardiovascular;  Laterality: [name];  . PENILE PROSTHESIS IMPLANT    . POLYPECTOMY  04/14/2017   Procedure: POLYPECTOMY;  Surgeon: Danie Binder, MD;  [facility];  Service: Endoscopy;;  colon  . STUMP REVISION Right 08/16/2013   Procedure: STUMP REVISION- right Below Knee Amputation;  Surgeon: Newt Minion, MD;  Location: [place];  Service: Orthopedics;  Laterality: Right;  . TEE WITHOUT CARDIOVERSION [name] 07/03/2013   Procedure: TRANSESOPHAGEAL ECHOCARDIOGRAM (TEE);  Surgeon: Larey Dresser, MD;  Location: [place];  Service: Cardiovascular;  Laterality: [name];  . TONSILLECTOMY AND ADENOIDECTOMY      Family History  Problem Relation Age of Onset  . Pancreatic cancer Mother   . Diabetes Mother   . Hyperlipidemia Mother   . Diabetes Unknown   . Breast cancer Sister   . Depression Maternal Grandmother   . Heart disease Maternal Grandmother   . Depression Maternal Grandfather   . Heart disease Maternal Grandfather   . Depression Paternal Grandmother   . Heart disease Paternal Grandmother   . Depression Paternal Grandfather   . Heart disease Paternal Grandfather     Social History   Tobacco Use  . Smoking status: Never Smoker  . Smokeless tobacco: Never Used  Substance Use Topics  . Alcohol use: No     Alcohol/week: 0.0 oz  . Drug use: No    Medications: I have reviewed the patient's current medications. Allergies as of 10/31/2017      Reactions   Amoxicillin Nausea And Vomiting   Has patient had a PCN reaction causing immediate rash, facial/tongue/throat swelling, SOB or lightheadedness with hypotension: No Has patient had a PCN reaction causing severe rash involving mucus membranes or skin necrosis: No Has patient had a PCN reaction that required hospitalization: No Has patient had a PCN reaction occurring within the last 10 years: Yes If all of the above answers are "NO", then may proceed with Cephalosporin use.      Medication List        Accurate as of 10/31/17 11:59 PM. Always use your most recent  med list.          aspirin EC 81 MG tablet Take 1 tablet (81 mg total) by mouth daily.   atorvastatin 80 MG tablet Commonly known as:  LIPITOR Take 1 tablet (80 mg total) by mouth daily.   carvedilol 25 MG tablet Commonly known as:  COREG Take 25 mg by mouth 2 (two) times daily.   furosemide 80 MG tablet Commonly known as:  LASIX Take 80 mg by mouth daily.   HUMALOG KWIKPEN 100 UNIT/ML KiwkPen Generic drug:  insulin lispro Inject 22 Units into the skin 3 (three) times daily before meals.   hydrALAZINE 10 MG tablet Commonly known as:  APRESOLINE Take 1 tablet (10 mg total) by mouth 3 (three) times daily.   LEVEMIR FLEXTOUCH 100 UNIT/ML Pen Generic drug:  Insulin Detemir Inject 52 Units into the skin at bedtime.   losartan 50 MG tablet Commonly known as:  COZAAR TAKE 1 TABLET BY MOUTH  DAILY   mupirocin ointment 2 % Commonly known as:  BACTROBAN Apply 1 application topically daily.   naproxen sodium 220 MG tablet Commonly known as:  ALEVE Take 220 mg by mouth daily as needed (pain).   nitroGLYCERIN 0.2 mg/hr patch Commonly known as:  NITRODUR - Dosed in mg/24 hr Place 0.2 mg onto the skin every 3 (three) days. Applies to foot/ankle area to increase blood  flow   pantoprazole 40 MG tablet Commonly known as:  PROTONIX Take 1 tablet (40 mg total) by mouth daily.   polyethylene glycol packet Commonly known as:  MIRALAX / GLYCOLAX Take 17 g by mouth daily as needed for mild constipation.   potassium chloride 10 MEQ tablet Commonly known as:  K-DUR,KLOR-CON Take 1 tablet (10 mEq total) by mouth daily.   promethazine 25 MG tablet Commonly known as:  PHENERGAN Take 1 tab every 6-8 hours for nausea.   senna-docusate 8.6-50 MG tablet Commonly known as:  Senokot-S Take 1 tablet by mouth daily as needed for mild constipation.       ROS:  A comprehensive review of systems was negative except for: Ears, nose, mouth, throat, and face: positive for sinusitis Cardiovascular: positive for HTN Cyst on back of neck  Blood pressure 123/83, pulse 80, temperature 98 F (36.7 C), resp. rate 18, height 5\' 11"  (1.803 m), weight 270 lb (122.5 kg). Physical Exam  Constitutional: He is oriented to person, place, and time and well-developed, well-nourished, and in no distress.  HENT:  Head: Normocephalic.  Right posterior neck (right of midline), 4cm, mobile and fluctuant mass consistent with cyst, no erythema or drainage  Eyes: Pupils are equal, round, and reactive to light.  Neck: Normal range of motion.  Cardiovascular: Normal rate and regular rhythm.  Pulmonary/Chest: Effort normal and breath sounds normal.  Abdominal: Soft. He exhibits no distension. There is no tenderness.  Musculoskeletal:  Right arm weakness, right BKA with prosthesis in place  Neurological: He is alert and oriented to person, place, and time.  Skin: Skin is warm and dry.  Psychiatric: Mood, memory, affect and judgment normal.  Vitals reviewed.   Results: None   Assessment & Plan:  DERICK SEMINARA is a 57 y.o. male with a large recurrent cyst on his neck, likely an epidermoid/ sebaceous type cyst given his prior description. It is right of midline and mobile, making  any thing neurologic less likely.  The area is not tender, but he is annoyed by people continuously asking him about it, and telling him  he needs to get it checked out.  He wants to get it removed, and does not want to undergo a local procedure in the office like previously.   -OR for sebaceous cyst excision, will be able to have the patient on his left side in order to access  -Discussed using local anesthesia, and that anesthesia would determine the best form of anesthesia for him given him history and risk, etc but will likely be able to just have some sedation  -The patient has recently seen Dr. Harl Bowie and has no cardiac symptoms. I will notify Dr. Harl Bowie of the plans pending any concern from him with proceeding with the excision     All questions were answered to the satisfaction of the patient and family.  The risk and benefits of sebaceous cyst removal were discussed including but not limited to bleeding, infection, inability to remove the complete cyst, recurrence, rupture.  We also discussed the possibility that this is a lipoma versus a mass or a lymph node, and that it could be a cancer, but that we would not know until the final pathology.  He understands that if it were a cancer, then more surgery/ investigation would have to be performed following the removal of the cyst/ mass.  After careful consideration, Albert [name] has decided to proceed.    Virl Cagey 11/01/2017, 11:46 AM

## 2017-11-06 ENCOUNTER — Telehealth: Payer: Self-pay | Admitting: General Surgery

## 2017-11-06 NOTE — Telephone Encounter (Signed)
-----   Message from Arnoldo Lenis, MD sent at 11/06/2017  4:09 PM EST ----- Regarding: RE: Surgery Pending for Cyst removal  Im ok proceeding with the procedure, he has many chronic problems but they have been stable. Ok if you need to hold aspirin a period of time  Zandra Abts MD ----- Message ----- From: Virl Cagey, MD Sent: 11/01/2017  12:06 PM To: Gunnar Bulla, MD Subject: Surgery Pending for Cyst removal               Dr. Harl Bowie,  I have a patient that you saw last week (doing well) that was seen by me for a sebaceous cyst removal on the back of his neck.  He has no symptoms, and otherwise seems to be doing well for him.  I wanted to touch base with you prior to his procedure, which likely will consist of local anesthesia/ sedation but always has the possibility of needing more given that the cyst is 4cm in size and the location.    Would you want him to come back to your office for any risk stratification/ or want to order anything prior to proceeding with excision.  Thanks, Curlene Labrum, MD [facility] [address] [place], [place] [address] [phone] (office)

## 2017-11-08 NOTE — Progress Notes (Signed)
Patient scheduled for surgical procedure on 11/15/2017.  Has severe CAD, which is being treated medically.  Last cardiology office visit was in 2 of 2018.  Dr Patsey Berthold made aware and is willing to proceed, as planned.  Will emphasize taking Coreg, Losartan and a new Nitroglycerin patch the am of surgery.

## 2017-11-08 NOTE — Patient Instructions (Addendum)
Albert [name]  11/08/2017     @PREFPERIOPPHARMACY @   Your procedure is scheduled on 11/15/2017.  Report to [facility] at 6:15 A.M.  Call this number if you have problems the morning of surgery:  [phone]   Remember:  Do not eat food or drink liquids after midnight.  Take these medicines the morning of surgery with A SIP OF WATER Coreg, Losartan, Protonix and place NEW nitroglycerin patch  Levemir - 1/29  PM 50% of usual dose = 27 units             Humalog -1/29 Take usual dose of 24 units before each meal, however do not take bedtime dose                    1/30 Check your sugar when you get up.  If above 220 take 1/2 of your usual coverage dose                                Do not wear jewelry, make-up or nail polish.  Do not wear lotions, powders, or perfumes, or deodorant.  Do not shave 48 hours prior to surgery.  Men may shave face and neck.  Do not bring valuables to the hospital.  [facility] is not responsible for any belongings or valuables.  Contacts, dentures or bridgework may not be worn into surgery.  Leave your suitcase in the car.  After surgery it may be brought to your room.  For patients admitted to the hospital, discharge time will be determined by your treatment team.  Patients discharged the day of surgery will not be allowed to drive home.   Please read over the following fact sheets that you were given. Surgical Site Infection Prevention and Anesthesia Post-op Instructions     PATIENT INSTRUCTIONS POST-ANESTHESIA  IMMEDIATELY FOLLOWING SURGERY:  Do not drive or operate machinery for the first twenty four hours after surgery.  Do not make any important decisions for twenty four hours after surgery or while taking narcotic pain medications or sedatives.  If you develop intractable nausea and vomiting or a severe headache please notify your doctor immediately.  FOLLOW-UP:  Please make an appointment with your surgeon as instructed. You do not need to  follow up with anesthesia unless specifically instructed to do so.  WOUND CARE INSTRUCTIONS (if applicable):  Keep a dry clean dressing on the anesthesia/puncture wound site if there is drainage.  Once the wound has quit draining you may leave it open to air.  Generally you should leave the bandage intact for twenty four hours unless there is drainage.  If the epidural site drains for more than 36-48 hours please call the anesthesia department.  QUESTIONS?:  Please feel free to call your physician or the hospital operator if you have any questions, and they will be happy to assist you.      Excision of Skin Lesions Excision of a skin lesion refers to the removal of a section of skin by making small cuts (incisions) in the skin. This procedure may be done to remove a cancerous (malignant) or noncancerous (benign) growth on the skin. It is typically done to treat or prevent cancer or infection. It may also be done to improve cosmetic appearance. The procedure may be done to remove:  Cancerous growths, such as basal cell carcinoma, squamous cell carcinoma, or melanoma.  Noncancerous growths, such as a cyst  or lipoma.  Growths, such as moles or skin tags, which may be removed for cosmetic reasons.  Various excision or surgical techniques may be used depending on your condition, the location of the lesion, and your overall health. Tell a health care provider about:  Any allergies you have.  All medicines you are taking, including vitamins, herbs, eye drops, creams, and over-the-counter medicines.  Any problems you or family members have had with anesthetic medicines.  Any blood disorders you have.  Any surgeries you have had.  Any medical conditions you have.  Whether you are pregnant or may be pregnant. What are the risks? Generally, this is a safe procedure. However, problems may occur, including:  Bleeding.  Infection.  Scarring.  Recurrence of the cyst, lipoma, or  cancer.  Changes in skin sensation or appearance, such as discoloration or swelling.  Reaction to the anesthetics.  Allergic reaction to surgical materials or ointments.  Damage to nerves, blood vessels, muscles, or other structures.  Continued pain.  What happens before the procedure?  Ask your health care provider about: ? Changing or stopping your regular medicines. This is especially important if you are taking diabetes medicines or blood thinners. ? Taking medicines such as aspirin and ibuprofen. These medicines can thin your blood. Do not take these medicines before your procedure if your health care provider instructs you not to.  You may be asked to take certain medicines.  You may be asked to stop smoking.  You may have an exam or testing.  Plan to have someone take you home after the procedure.  Plan to have someone help you with activities during recovery. What happens during the procedure?  To reduce your risk of infection: ? Your health care team will wash or sanitize their hands. ? Your skin will be washed with soap.  You will be given a medicine to numb the area (local anesthetic).  One of the following excision techniques will be performed.  At the end of any of these procedures, antibiotic ointment will be applied as needed. Each of the following techniques may vary among health care providers and hospitals. Complete Surgical Excision The area of skin that needs to be removed will be marked with a pen. Using a small scalpel or scissors, the surgeon will gently cut around and under the lesion until it is completely removed. The lesion will be placed in a fluid and sent to the lab for examination. If necessary, bleeding will be controlled with a device that delivers heat (electrocautery). The edges of the wound may be stitched (sutured) together, and a bandage (dressing) will be applied. This procedure may be performed to treat a cancerous growth or a  noncancerous cyst or lesion. Excision of a Cyst The surgeon will make an incision on the cyst. The entire cyst will be removed through the incision. The incision may be closed with sutures. Shave Excision During shave excision, the surgeon will use a small blade or an electrically heated loop instrument to shave off the lesion. This may be done to remove a mole or a skin tag. The wound will usually be left to heal on its own without sutures. Punch Excision During punch excision, the surgeon will use a small tool that is like a cookie cutter or a hole punch to cut a circle shape out of the skin. The outer edges of the skin will be sutured together. This may be done to remove a mole or a scar or to perform a  biopsy of the lesion. Mohs Micrographic Surgery During Mohs micrographic surgery, layers of the lesion will be removed with a scalpel or a loop instrument and will be examined right away under a microscope. Layers will be removed until all of the abnormal or cancerous tissue has been removed. This procedure is minimally invasive, and it ensures the best cosmetic outcome. It involves the removal of as little normal tissue as possible. Mohs is usually done to treat skin cancer, such as basal cell carcinoma or squamous cell carcinoma, particularly on the face and ears. Depending on the size of the surgical wound, it may be sutured closed. What happens after the procedure?  Return to your normal activities as told by your health care provider.  Talk with your health care provider to discuss any test results, treatment options, and if necessary, the need for more tests. This information is not intended to replace advice given to you by your health care provider. Make sure you discuss any questions you have with your health care provider. Document Released: 12/28/2009 Document Revised: 03/10/2016 Document Reviewed: 11/19/2014 Elsevier Interactive Patient Education  [employer].

## 2017-11-09 ENCOUNTER — Encounter (INDEPENDENT_AMBULATORY_CARE_PROVIDER_SITE_OTHER): Payer: Self-pay | Admitting: Orthopedic Surgery

## 2017-11-09 ENCOUNTER — Ambulatory Visit (INDEPENDENT_AMBULATORY_CARE_PROVIDER_SITE_OTHER): Payer: Medicare Other | Admitting: Orthopedic Surgery

## 2017-11-09 DIAGNOSIS — Z89432 Acquired absence of left foot: Secondary | ICD-10-CM

## 2017-11-09 DIAGNOSIS — L97421 Non-pressure chronic ulcer of left heel and midfoot limited to breakdown of skin: Secondary | ICD-10-CM

## 2017-11-09 DIAGNOSIS — I739 Peripheral vascular disease, unspecified: Secondary | ICD-10-CM | POA: Diagnosis not present

## 2017-11-09 NOTE — Progress Notes (Signed)
Office Visit Note   Patient: Albert [name]           Date of Birth: Dec 12, 1960           MRN: [id number] Visit Date: 11/09/2017              Requested by: Celene Squibb, MD [place], [place] [address] PCP: Celene Squibb, MD  No chief complaint on file.     HPI: Patient is a 57 year old gentleman status post transtibial amputation the right with a recurrent ulceration left transmetatarsal amputation. Today this is healed. Does continue with dry dressings to the left foot.   Assessment & Plan: Visit Diagnoses:  1. Peripheral vascular disease, unspecified ([place])   2. Midfoot ulcer, left, limited to breakdown of skin ([place])   3. History of transmetatarsal amputation of left foot ([place])     Plan: The ulcer was debrided of skin and soft tissue back to healthy viable tissue a dry dressing was applied on the left.  Discussed the importance of protected weightbearing on the left. Patient to follow up in office as needed for paring of callus.  Follow-Up Instructions: Return if symptoms worsen or fail to improve.   Ortho Exam  Patient is alert, oriented, no adenopathy, well-dressed, normal affect, normal respiratory effort. Left foot has a good dorsalis pedis pulse his foot is plantigrade no Achilles contracture.  He has a large callused ulceration on the residual limb of the transmetatarsal amputation of the left foot.  After informed consent a 10 blade knife was used to debride skin and soft tissue back to healthy viable tissue. No open area. no drainage no odor no signs of infection.  Imaging: No results found. No images are attached to the encounter.  Labs: Lab Results  Component Value Date   HGBA1C 9.1 (H) 02/08/2016   HGBA1C 8.2 (H) 05/16/2015   HGBA1C 8.2 (H) 06/25/2013   REPTSTATUS 02/13/2016 FINAL 02/08/2016   CULT NO GROWTH 5 DAYS 02/08/2016   LABORGA STAPHYLOCOCCUS AUREUS 06/26/2013    @LABSALLVALUES (HGBA1)@  There is no height or weight on file to  calculate BMI.  Orders:  No orders of the defined types were placed in this encounter.  No orders of the defined types were placed in this encounter.    Procedures: No procedures performed  Clinical Data: No additional findings.  ROS:  All other systems negative, except as noted in the HPI. Review of Systems  Constitutional: Negative for chills and fever.  Cardiovascular: Positive for leg swelling.  Skin: Positive for color change. Negative for wound.    Objective: Vital Signs: There were no vitals taken for this visit.  Specialty Comments:  No specialty comments available.  PMFS History: Patient Active Problem List   Diagnosis Date Noted  . Amputated below knee, right ([place]) 10/19/2017  . History of transmetatarsal amputation of left foot ([place]) 06/12/2017  . Special screening for malignant neoplasms, colon   . Midfoot ulcer, left, limited to breakdown of skin ([place]) 11/18/2016  . ARF (acute renal failure) ([place]) 02/08/2016  . Chronic combined systolic and diastolic CHF (congestive heart failure) ([place]) 02/08/2016  . Splenic infarct 01/06/2014  . Acute systolic CHF (congestive heart failure) ([place]) 08/03/2013  . Hx of right BKA ([place]) 07/26/2013  . Anemia 07/15/2013  . Hyperkalemia 07/15/2013  . Hyposmolality and/or hyponatremia 07/15/2013  . Acute respiratory failure ([place]) 07/15/2013  . Mitral regurgitation 07/15/2013  . Oliguria 07/15/2013  . Bacteremia due to Staphylococcus aureus  07/15/2013  . Hyperchloremia 07/15/2013  . Acute kidney failure ([place]) 07/14/2013  . Hypotension, unspecified 07/14/2013  . Acute systolic heart failure ([place]) 07/12/2013  . Acute pulmonary edema ([place]) 07/11/2013  . Pneumonia, organism unspecified(486) 07/11/2013  . Hypoxemia 07/11/2013  . Cardiomyopathy, ischemic 07/03/2013  . Coronary atherosclerosis of native coronary artery 07/01/2013  . NSTEMI (non-ST elevated myocardial infarction) ([place]) 06/26/2013  . AKI (acute kidney injury)  ([place]) 06/25/2013  . Shock circulatory ([place]) 06/25/2013  . Dehydration with hyponatremia 06/25/2013  . Leukocytosis, unspecified 06/25/2013  . Sepsis(995.91) 06/25/2013  . Aftercare following surgery of the circulatory system, [place] 02/11/2013  . Peripheral vascular disease, unspecified ([place]) 12/31/2012  . Diabetes mellitus out of control ([place]) 12/17/2012  . Essential hypertension, benign 12/17/2012  . Atherosclerotic peripheral vascular disease ([place]) 12/17/2012  . Hypercholesteremia    Past Medical History:  Diagnosis Date  . Allergic rhinitis   . Anemia    takes supplement  . Chronic cough   . Chronic sinusitis   . Complication of anesthesia    couldn't swallow and talk  . Coronary artery disease   . CVA (cerebral infarction) 2011   R hand deficit  . Diabetes mellitus without complication ([place]) ~3295   last HbA1c ~9 .... fasting 160s  . GERD (gastroesophageal reflux disease)   . Hypercholesteremia   . Hypertension   . Myocardial infarction ([place])   . Osteomyelitis ([place])    first and third metatarsal  . Pneumonia    hx of walking  . Splenic infarct    setting of lupus anticoagulant  . Stroke [facility])    right arm weakness    Family History  Problem Relation Age of Onset  . Pancreatic cancer Mother   . Diabetes Mother   . Hyperlipidemia Mother   . Diabetes Unknown   . Breast cancer Sister   . Depression Maternal Grandmother   . Heart disease Maternal Grandmother   . Depression Maternal Grandfather   . Heart disease Maternal Grandfather   . Depression Paternal Grandmother   . Heart disease Paternal Grandmother   . Depression Paternal Grandfather   . Heart disease Paternal Grandfather     Past Surgical History:  Procedure Laterality Date  . 4th Toe Amputation Left Jan. 2014   4th toe in [place]  . AMPUTATION Left 12/08/2012   Procedure: AMPUTATION DIGIT;  Surgeon: Mcarthur Rossetti, MD;  Location: [facility];  Service: Orthopedics;  Laterality: Left;  3rd toe amputation  possible 5th toe amputation  . AMPUTATION Left 12/11/2012   Procedure: Amputation of 2nd Toe;  Surgeon: Mcarthur Rossetti, MD;  Location: [facility];  Service: Orthopedics;  Laterality: Left;  . AMPUTATION Right 06/29/2013   Procedure: AMPUTATION BELOW KNEE;  Surgeon: Newt Minion, MD;  Location: [facility];  Service: Orthopedics;  Laterality: Right;  . AMPUTATION Left 08/29/2014   Procedure: Left Great Toe Amputation at MTP;  Surgeon: Newt Minion, MD;  Location: [facility];  Service: Orthopedics;  Laterality: Left;  . AMPUTATION Left 10/31/2014   Procedure: Midfoot Amputation;  Surgeon: Newt Minion, MD;  Location: [facility];  Service: Orthopedics;  Laterality: Left;  . BYPASS GRAFT POPLITEAL TO TIBIAL Left 12/13/2012   Procedure: BYPASS GRAFT POPLITEAL TO TIBIAL;  Surgeon: Serafina Mitchell, MD;  Location: [facility];  Service: Vascular;  Laterality: Left;  Left Popliteal to Posterior Tibial Bypass Graft with reversed saphenous vein graft  . CARDIAC CATHETERIZATION    . COLONOSCOPY [name] 04/14/2017   Procedure: COLONOSCOPY;  Surgeon: Danie Binder, MD;  [facility];  Service: Endoscopy;  Laterality: [name];  1000  . I&D EXTREMITY Left 12/08/2012   Procedure: IRRIGATION AND DEBRIDEMENT EXTREMITY;  Surgeon: Mcarthur Rossetti, MD;  Location: [facility];  Service: Orthopedics;  Laterality: Left;  . I&D EXTREMITY Left 12/11/2012   Procedure: REPEAT I&D LEFT FOOT;  Surgeon: Mcarthur Rossetti, MD;  Location: [facility];  Service: Orthopedics;  Laterality: Left;  . INCISION AND DRAINAGE Right 06/25/2013   Procedure: INCISION AND DRAINAGE RIGHT FOOT;  Surgeon: Mcarthur Rossetti, MD;  Location: [facility];  Service: Orthopedics;  Laterality: Right;  . LEFT HEART CATHETERIZATION WITH CORONARY ANGIOGRAM [name] 06/28/2013   Procedure: LEFT HEART CATHETERIZATION WITH CORONARY ANGIOGRAM;  Surgeon: Burnell Blanks, MD;  Location: [facility] C[facility] LAB;  Service: Cardiovascular;  Laterality: [name];  . PENILE PROSTHESIS IMPLANT    .  POLYPECTOMY  04/14/2017   Procedure: POLYPECTOMY;  Surgeon: Danie Binder, MD;  [facility];  Service: Endoscopy;;  colon  . STUMP REVISION Right 08/16/2013   Procedure: STUMP REVISION- right Below Knee Amputation;  Surgeon: Newt Minion, MD;  Location: [facility];  Service: Orthopedics;  Laterality: Right;  . TEE WITHOUT CARDIOVERSION [name] 07/03/2013   Procedure: TRANSESOPHAGEAL ECHOCARDIOGRAM (TEE);  Surgeon: Larey Dresser, MD;  Location: [facility];  Service: Cardiovascular;  Laterality: [name];  . TONSILLECTOMY AND ADENOIDECTOMY     Social History   Occupational History  . 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 Smoking status: Never Smoker  . Smokeless tobacco: Never Used  Substance and Sexual Activity  . Alcohol use: No    Alcohol/week: 0.0 oz  . Drug use: No  . Sexual activity: No

## 2017-11-10 ENCOUNTER — Other Ambulatory Visit: Payer: Self-pay

## 2017-11-10 ENCOUNTER — Encounter (HOSPITAL_COMMUNITY): Payer: Self-pay

## 2017-11-10 ENCOUNTER — Encounter (HOSPITAL_COMMUNITY)
Admission: RE | Admit: 2017-11-10 | Discharge: 2017-11-10 | Disposition: A | Discharge status: Home / Self Care | Payer: Medicare Other | Source: Ambulatory Visit | Attending: General Surgery | Admitting: General Surgery

## 2017-11-10 DIAGNOSIS — Z01818 Encounter for other preprocedural examination: Secondary | ICD-10-CM | POA: Diagnosis not present

## 2017-11-10 HISTORY — DX: Cardiac murmur, unspecified: R01.1

## 2017-11-10 LAB — BASIC METABOLIC PANEL
Anion gap: 11 (ref 5–15)
BUN: 23 mg/dL — AB (ref 6–20)
CHLORIDE: 107 mmol/L (ref 101–111)
CO2: 20 mmol/L — ABNORMAL LOW (ref 22–32)
CREATININE: 1.3 mg/dL — AB (ref 0.61–1.24)
Calcium: 8.6 mg/dL — ABNORMAL LOW (ref 8.9–10.3)
GFR calc Af Amer: >60 mL/min (ref >60)
GFR calc non Af Amer: >60 mL/min (ref >60)
GLUCOSE: 145 mg/dL — AB (ref 65–99)
Potassium: 3.9 mmol/L (ref 3.5–5.1)
Sodium: 138 mmol/L (ref 135–145)

## 2017-11-10 LAB — CBC
HCT: 42 % (ref 39.0–52.0)
HEMOGLOBIN: 13.1 g/dL (ref 13.0–17.0)
MCH: 26.2 pg (ref 26.0–34.0)
MCHC: 31.2 g/dL (ref 30.0–36.0)
MCV: 84 fL (ref 78.0–100.0)
PLATELETS: 228 10*3/uL (ref 150–400)
RBC: 5 MIL/uL (ref 4.22–5.81)
RDW: 13.7 % (ref 11.5–15.5)
WBC: 8.8 10*3/uL (ref 4.0–10.5)

## 2017-11-10 LAB — HEMOGLOBIN A1C
HEMOGLOBIN A1C: 7.7 % — AB (ref 4.8–5.6)
Mean Plasma Glucose: 174.29 mg/dL

## 2017-11-10 LAB — GLUCOSE, CAPILLARY: Glucose-Capillary: 132 mg/dL — ABNORMAL HIGH (ref 65–99)

## 2017-11-10 NOTE — Pre-Procedure Instructions (Signed)
HgbA1C routed to PCP. 

## 2017-11-14 NOTE — Anesthesia Preprocedure Evaluation (Addendum)
Anesthesia Evaluation  Patient identified by MRN, date of birth, ID band Patient awake    Reviewed: reviewed documented beta blocker date and time   History of Anesthesia Complications (+) history of anesthetic complications (woke up after surgery feeling week and felt like her couldn't talk or move.  Uncomfortable, recovered uneventfully)  Airway Mallampati: I  TM Distance: >3 FB Neck ROM: Full    Dental  (+) Teeth Intact   Pulmonary    Pulmonary exam normal        Cardiovascular Exercise Tolerance: Poor METS: 3 - Mets hypertension, Pt. on medications and Pt. on home beta blockers + CAD, + Past MI (in context of sepsis 2014, has improved CAD medically managed), + Peripheral Vascular Disease and +CHF (improved, stable)  Normal cardiovascular exam+ Valvular Problems/Murmurs MR  Rhythm:Regular Rate:Normal  Echocardiography (March 2015).    The tricuspid valve showed moderate regurgitation. The mitral valve showed moderate regurgitation.  EF was 45% and PA pressure was 63 (systolic).  - Overall limited images. Upper normal LV wall thickness with LVEF   approximately 50% based on best views. Cannot exclude periapical   wall motion abnormalities  - no recent symptoms. He is not in favor of considering CABG at this time. Given he has done well and has no symptoms reasonable to continue medical therapy at this time   (Jan 2019--Cardiology)  Normal sinus rhythm Low voltage QRS   (Jan 2019)   Neuro/Psych    GI/Hepatic GERD  Medicated and Controlled,  Endo/Other  diabetes, Poorly Controlled, Type 2Results for NICHOLIS, STEPANEK (MRN [id number]) as of 11/15/2017 06:03  11/10/2017 08:33 Hemoglobin A1C: 7.7 (H)   Renal/GU Renal disease (stable)Results for CARLIN, ATTRIDGE (MRN [id number]) as of 11/15/2017 06:03  11/10/2017 08:33 Sodium: 138 Potassium: 3.9 Chloride: 107 CO2: 20 (L) Glucose: 145 (H) Mean Plasma Glucose: 174.29 BUN:  23 (H) Creatinine: 1.30 (H)      Musculoskeletal   Abdominal (+) + obese,   Peds  Hematology  (+) Blood dyscrasia (stable), anemia , Results for DAM, ASHRAF (MRN [id number]) as of 11/15/2017 06:03  11/10/2017 08:33 WBC: 8.8 RBC: 5.00 Hemoglobin: 13.1 HCT: 42.0 MCV: 84.0 MCH: 26.2 MCHC: 31.2 RDW: 13.7 Platelets: 228    Anesthesia Other Findings   Reproductive/Obstetrics                         Anesthesia Physical Anesthesia Plan  ASA: IV  Anesthesia Plan: General   Post-op Pain Management:    Induction:   PONV Risk Score and Plan: Ondansetron and Midazolam  Airway Management Planned: LMA  Additional Equipment:   Intra-op Plan:   Post-operative Plan: Extubation in OR  Informed Consent: I have reviewed the patients History and Physical, chart, labs and discussed the procedure including the risks, benefits and alternatives for the proposed anesthesia with the patient or authorized representative who has indicated his/her understanding and acceptance.   Dental advisory given  Plan Discussed with: CRNA and Surgeon  Anesthesia Plan Comments:         Anesthesia Quick Evaluation

## 2017-11-15 ENCOUNTER — Ambulatory Visit (HOSPITAL_COMMUNITY): Payer: Medicare Other | Admitting: Anesthesiology

## 2017-11-15 ENCOUNTER — Ambulatory Visit (HOSPITAL_COMMUNITY)
Admission: RE | Admit: 2017-11-15 | Discharge: 2017-11-15 | Disposition: A | Discharge status: Home / Self Care | Payer: Medicare Other | Source: Ambulatory Visit | Attending: General Surgery | Admitting: General Surgery

## 2017-11-15 ENCOUNTER — Encounter (HOSPITAL_COMMUNITY): Payer: Self-pay

## 2017-11-15 ENCOUNTER — Encounter (HOSPITAL_COMMUNITY)
Admission: RE | Disposition: A | Discharge status: Home / Self Care | Payer: Self-pay | Source: Ambulatory Visit | Attending: General Surgery

## 2017-11-15 DIAGNOSIS — I252 Old myocardial infarction: Secondary | ICD-10-CM | POA: Insufficient documentation

## 2017-11-15 DIAGNOSIS — Z79899 Other long term (current) drug therapy: Secondary | ICD-10-CM | POA: Diagnosis not present

## 2017-11-15 DIAGNOSIS — L723 Sebaceous cyst: Secondary | ICD-10-CM | POA: Insufficient documentation

## 2017-11-15 DIAGNOSIS — Z880 Allergy status to penicillin: Secondary | ICD-10-CM | POA: Insufficient documentation

## 2017-11-15 DIAGNOSIS — Z7982 Long term (current) use of aspirin: Secondary | ICD-10-CM | POA: Diagnosis not present

## 2017-11-15 DIAGNOSIS — I110 Hypertensive heart disease with heart failure: Secondary | ICD-10-CM | POA: Diagnosis not present

## 2017-11-15 DIAGNOSIS — K219 Gastro-esophageal reflux disease without esophagitis: Secondary | ICD-10-CM | POA: Insufficient documentation

## 2017-11-15 DIAGNOSIS — Z89422 Acquired absence of other left toe(s): Secondary | ICD-10-CM | POA: Insufficient documentation

## 2017-11-15 DIAGNOSIS — Z8673 Personal history of transient ischemic attack (TIA), and cerebral infarction without residual deficits: Secondary | ICD-10-CM | POA: Insufficient documentation

## 2017-11-15 DIAGNOSIS — Z89412 Acquired absence of left great toe: Secondary | ICD-10-CM | POA: Insufficient documentation

## 2017-11-15 DIAGNOSIS — L720 Epidermal cyst: Secondary | ICD-10-CM | POA: Diagnosis not present

## 2017-11-15 DIAGNOSIS — E7800 Pure hypercholesterolemia, unspecified: Secondary | ICD-10-CM | POA: Insufficient documentation

## 2017-11-15 DIAGNOSIS — Q188 Other specified congenital malformations of face and neck: Secondary | ICD-10-CM | POA: Diagnosis not present

## 2017-11-15 DIAGNOSIS — E119 Type 2 diabetes mellitus without complications: Secondary | ICD-10-CM | POA: Insufficient documentation

## 2017-11-15 DIAGNOSIS — I509 Heart failure, unspecified: Secondary | ICD-10-CM | POA: Insufficient documentation

## 2017-11-15 DIAGNOSIS — Z89511 Acquired absence of right leg below knee: Secondary | ICD-10-CM | POA: Diagnosis not present

## 2017-11-15 DIAGNOSIS — I2510 Atherosclerotic heart disease of native coronary artery without angina pectoris: Secondary | ICD-10-CM | POA: Insufficient documentation

## 2017-11-15 DIAGNOSIS — Z794 Long term (current) use of insulin: Secondary | ICD-10-CM | POA: Diagnosis not present

## 2017-11-15 HISTORY — PX: MASS EXCISION: SHX2000

## 2017-11-15 LAB — GLUCOSE, CAPILLARY
GLUCOSE-CAPILLARY: 145 mg/dL — AB (ref 65–99)
Glucose-Capillary: 187 mg/dL — ABNORMAL HIGH (ref 65–99)

## 2017-11-15 SURGERY — EXCISION MASS
Anesthesia: General | Site: Neck

## 2017-11-15 MED ORDER — POVIDONE-IODINE 10 % OINT PACKET: TOPICAL_OINTMENT | CUTANEOUS | Status: DC | PRN

## 2017-11-15 MED ORDER — LACTATED RINGERS IV SOLN
INTRAVENOUS | Status: DC
  Administered 2017-11-15 (×2): via INTRAVENOUS

## 2017-11-15 MED ORDER — PROPOFOL 10 MG/ML IV BOLUS
INTRAVENOUS | Status: DC | PRN
  Administered 2017-11-15: 170 mg via INTRAVENOUS

## 2017-11-15 MED ORDER — CEFAZOLIN SODIUM-DEXTROSE 1-4 GM/50ML-% IV SOLN
INTRAVENOUS | Status: AC
  Filled 2017-11-15: qty 50

## 2017-11-15 MED ORDER — POVIDONE-IODINE 10 % EX OINT
TOPICAL_OINTMENT | CUTANEOUS | Status: AC
  Filled 2017-11-15: qty 1

## 2017-11-15 MED ORDER — CEFAZOLIN SODIUM-DEXTROSE 2-4 GM/100ML-% IV SOLN
INTRAVENOUS | Status: AC
  Filled 2017-11-15: qty 100

## 2017-11-15 MED ORDER — PROPOFOL 10 MG/ML IV BOLUS
INTRAVENOUS | Status: AC
  Filled 2017-11-15: qty 20

## 2017-11-15 MED ORDER — HEMOSTATIC AGENTS (NO CHARGE) OPTIME
TOPICAL | Status: DC | PRN
  Administered 2017-11-15: 1 via TOPICAL

## 2017-11-15 MED ORDER — FENTANYL CITRATE (PF) 100 MCG/2ML IJ SOLN
INTRAMUSCULAR | Status: DC | PRN
  Administered 2017-11-15: 25 ug via INTRAVENOUS
  Administered 2017-11-15: 50 ug via INTRAVENOUS
  Administered 2017-11-15: 25 ug via INTRAVENOUS

## 2017-11-15 MED ORDER — OXYCODONE HCL 5 MG PO TABS: 5.0000 mg | ORAL_TABLET | Freq: Once | ORAL | Status: DC | PRN

## 2017-11-15 MED ORDER — CHLORHEXIDINE GLUCONATE CLOTH 2 % EX PADS: 6.0000 | MEDICATED_PAD | Freq: Once | CUTANEOUS | Status: DC

## 2017-11-15 MED ORDER — BUPIVACAINE HCL (PF) 0.5 % IJ SOLN
INTRAMUSCULAR | Status: AC
  Filled 2017-11-15: qty 30

## 2017-11-15 MED ORDER — HYDROMORPHONE HCL 1 MG/ML IJ SOLN
0.2500 mg | INTRAMUSCULAR | Status: DC | PRN
  Administered 2017-11-15: 0.5 mg via INTRAVENOUS
  Filled 2017-11-15: qty 0.5

## 2017-11-15 MED ORDER — BUPIVACAINE HCL (PF) 0.5 % IJ SOLN
INTRAMUSCULAR | Status: DC | PRN
  Administered 2017-11-15: 15 mL

## 2017-11-15 MED ORDER — FENTANYL CITRATE (PF) 100 MCG/2ML IJ SOLN
INTRAMUSCULAR | Status: AC
  Filled 2017-11-15: qty 2

## 2017-11-15 MED ORDER — ONDANSETRON 4 MG PO TBDP
4.0000 mg | ORAL_TABLET | Freq: Once | ORAL | Status: AC
  Administered 2017-11-15: 4 mg via ORAL
  Filled 2017-11-15: qty 1

## 2017-11-15 MED ORDER — OXYCODONE HCL 5 MG PO TABS: 5.0000 mg | ORAL_TABLET | ORAL | 0 refills | Status: DC | PRN

## 2017-11-15 MED ORDER — OXYCODONE HCL 5 MG/5ML PO SOLN: 5.0000 mg | Freq: Once | ORAL | Status: DC | PRN

## 2017-11-15 MED ORDER — LIDOCAINE HCL 1 % IJ SOLN
INTRAMUSCULAR | Status: DC | PRN
  Administered 2017-11-15: 50 mg via INTRADERMAL

## 2017-11-15 MED ORDER — 0.9 % SODIUM CHLORIDE (POUR BTL) OPTIME
TOPICAL | Status: DC | PRN
  Administered 2017-11-15: 1000 mL

## 2017-11-15 MED ORDER — DEXTROSE 5 % IV SOLN
3.0000 g | INTRAVENOUS | Status: AC
  Administered 2017-11-15: 3 g via INTRAVENOUS
  Filled 2017-11-15: qty 3000

## 2017-11-15 MED ORDER — EPHEDRINE SULFATE 50 MG/ML IJ SOLN
INTRAMUSCULAR | Status: DC | PRN
  Administered 2017-11-15 (×4): 10 mg via INTRAVENOUS

## 2017-11-15 MED ORDER — MIDAZOLAM HCL 2 MG/2ML IJ SOLN
1.0000 mg | Freq: Once | INTRAMUSCULAR | Status: AC | PRN
  Administered 2017-11-15: 2 mg via INTRAVENOUS
  Filled 2017-11-15: qty 2

## 2017-11-15 SURGICAL SUPPLY — 47 items
BAG HAMPER (MISCELLANEOUS) ×3 IMPLANT
BENZOIN TINCTURE PRP APPL 2/3 (GAUZE/BANDAGES/DRESSINGS) ×3 IMPLANT
CHLORAPREP W/TINT 10.5 ML (MISCELLANEOUS) ×3 IMPLANT
CLOSURE WOUND 1/4 X3 (GAUZE/BANDAGES/DRESSINGS) ×1
CLOTH BEACON ORANGE TIMEOUT ST (SAFETY) ×3 IMPLANT
COVER LIGHT HANDLE STERIS (MISCELLANEOUS) ×6 IMPLANT
DECANTER SPIKE VIAL GLASS SM (MISCELLANEOUS) ×3 IMPLANT
DERMABOND ADVANCED (GAUZE/BANDAGES/DRESSINGS)
DERMABOND ADVANCED .7 DNX12 (GAUZE/BANDAGES/DRESSINGS) IMPLANT
DRAPE EENT ADH APERT 31X51 STR (DRAPES) IMPLANT
DRSG TEGADERM 4X4.75 (GAUZE/BANDAGES/DRESSINGS) ×3 IMPLANT
ELECT NEEDLE TIP 2.8 STRL (NEEDLE) ×3 IMPLANT
ELECT REM PT RETURN 9FT ADLT (ELECTROSURGICAL) ×3
ELECTRODE REM PT RTRN 9FT ADLT (ELECTROSURGICAL) ×1 IMPLANT
FORMALIN 10 PREFIL 120ML (MISCELLANEOUS) ×3 IMPLANT
GAUZE SPONGE 4X4 12PLY STRL (GAUZE/BANDAGES/DRESSINGS) ×6 IMPLANT
GLOVE BIO SURGEON STRL SZ 6.5 (GLOVE) ×2 IMPLANT
GLOVE BIO SURGEON STRL SZ7 (GLOVE) ×3 IMPLANT
GLOVE BIO SURGEONS STRL SZ 6.5 (GLOVE) ×1
GLOVE BIOGEL PI IND STRL 6.5 (GLOVE) ×1 IMPLANT
GLOVE BIOGEL PI IND STRL 7.0 (GLOVE) ×2 IMPLANT
GLOVE BIOGEL PI INDICATOR 6.5 (GLOVE) ×2
GLOVE BIOGEL PI INDICATOR 7.0 (GLOVE) ×4
GOWN STRL REUS W/ TWL XL LVL3 (GOWN DISPOSABLE) ×1 IMPLANT
GOWN STRL REUS W/TWL LRG LVL3 (GOWN DISPOSABLE) ×3 IMPLANT
GOWN STRL REUS W/TWL XL LVL3 (GOWN DISPOSABLE) ×2
HEMOSTAT ARISTA ABSORB 1G (MISCELLANEOUS) ×3 IMPLANT
KIT ROOM TURNOVER APOR (KITS) ×3 IMPLANT
MANIFOLD NEPTUNE II (INSTRUMENTS) ×3 IMPLANT
NEEDLE 22X1 1/2 OR ONLY (MISCELLANEOUS)
NEEDLE 22X1.5 STRL (OR ONLY) (MISCELLANEOUS) IMPLANT
NEEDLE HYPO 25X1 1.5 SAFETY (NEEDLE) ×3 IMPLANT
NS IRRIG 1000ML POUR BTL (IV SOLUTION) ×3 IMPLANT
PACK MINOR (CUSTOM PROCEDURE TRAY) IMPLANT
PAD ARMBOARD 7.5X6 YLW CONV (MISCELLANEOUS) ×3 IMPLANT
SET BASIN LINEN APH (SET/KITS/TRAYS/PACK) ×3 IMPLANT
SPONGE GAUZE 2X2 8PLY STER LF (GAUZE/BANDAGES/DRESSINGS) ×1
SPONGE GAUZE 2X2 8PLY STRL LF (GAUZE/BANDAGES/DRESSINGS) ×2 IMPLANT
STRIP CLOSURE SKIN 1/4X3 (GAUZE/BANDAGES/DRESSINGS) ×2 IMPLANT
SUT ETHILON 3 0 FSL (SUTURE) IMPLANT
SUT MNCRL AB 4-0 PS2 18 (SUTURE) ×3 IMPLANT
SUT PROLENE 4 0 PS 2 18 (SUTURE) IMPLANT
SUT VIC AB 3-0 SH 27 (SUTURE) ×2
SUT VIC AB 3-0 SH 27X BRD (SUTURE) ×1 IMPLANT
SUT VIC AB 4-0 PS2 27 (SUTURE) IMPLANT
SYR BULB IRRIGATION 50ML (SYRINGE) ×3 IMPLANT
SYR CONTROL 10ML LL (SYRINGE) ×3 IMPLANT

## 2017-11-15 NOTE — Op Note (Signed)
[facility] Operative Note  11/15/17  Preoperative Diagnosis: Sebaceous cyst on right posterior neck    Postoperative Diagnosis: Same   Procedure(s) Performed: Excision of cyst 5-6cm   Surgeon: Lanell Matar. Constance Haw, MD   Assistants: No qualified resident was available   Anesthesia: General endotracheal   Anesthesiologist: Mikey College, MD    Specimens: 1.) Cyst from neck 2.) Fibrotic tissue from under cyst    Estimated Blood Loss: Minimal   Blood Replacement: None    Complications: None   Wound Class: Clean    Operative Indications: Albert [name] is a 57 yo with a history of a sebaceous cyst on his right posterior neck for over 10 years following an office attempt at removal that resulted in the cyst returning.  He says that it has never drained or ruptured, or become infected, but it causes him some discomfort and distress from people asking him about the area.  After a discussion of the risk and benefits including but not limited to bleeding, infection, risk of recurrence, or risk that pathology demonstrates something other than a cyst that will require further surgery, he opted to proceed   Findings: Large cyst 5-6cm with keratinous contents    Procedure: The patient was taken to the operating room and general anesthesia was induced. Intravenous antibiotics were administered per protocol. The patient was placed in the left lateral decubitus position on a bean bag, and all pressure points were padded and the patient secured. The right posterior neck was prepared and draped in the usual sterile fashion.   The borders of the cyst were marked, and a natural skin crease was used to make an incision over the central portion of the area.  The incision was carried down through the subcutaneous tissue with electrocautery, and a hemostat to ensure that the cyst wall remained intact.  The wall was fibrotic and just beneath the dermis in the superficial plane, and the cyst  wall was carefully dissected off the dermis.  The cyst did rupture, but the wall was able to be grasped with alis clamps, and the spillage of the keratin contents was controlled.  The cyst extended down to the trapezius, and there was invaginations of the cyst in the caudal and cephalad direction.  After great care, the cyst was removed from the cavity mostly intact except for the wall that had split on the superficial surface.  The cavity was palpated, and there was an area of fibrotic tissue on the inferior surface and this was excised and sent separately for pathology.  The cavity was irrigated, and cautery was used throughout the surface to help ensure that no residual capsule would reform a cyst.  Arista was placed into the field.  The deep layer was partially closed due to cosmetic defects that would be created from closing the space completely.  The dermal layer was closed with 3-0 interrupted Vicryl, and the skin was closed with a running 4-0 Monocryl.  Steristrips were applied over the incision, and gauze and a Tegaderm    Final inspection revealed acceptable hemostasis. All counts were correct at the end of the case. The patient was awakened from anesthesia and extubate without complication.  The patient went to the PACU in stable condition.   Curlene Labrum, MD [facility] [address] [place], [place] [address] [phone] (office)

## 2017-11-15 NOTE — Interval H&P Note (Signed)
History and Physical Interval Note:  11/15/2017 7:09 AM  Albert [name]  has presented today for surgery, with the diagnosis of 4cm sebaceous cyst on neck  The various methods of treatment have been discussed with the patient and family. After consideration of risks, benefits and other options for treatment, the patient has consented to  Procedure(s): EXCISION 4CM CYST ON NECK ([name]) as a surgical intervention .  The patient's history has been reviewed, patient examined, no change in status, stable for surgery.  I have reviewed the patient's chart and labs.  Questions were answered to the patient's satisfaction.    Area marked. No additional questions. No new complaints.  See Telephone encounter for the Epic correspondence between Dr. Harl Bowie, Cardiology and myself.   Virl Cagey

## 2017-11-15 NOTE — Discharge Instructions (Signed)
Discharge Instructions: Shower per your regular routine. You can remove the clear bandage and gauze on 11/17/2017 (Friday).  Take tylenol and ibuprofen as needed for pain control, alternating every 4-6 hours.  Take Roxicodone for breakthrough pain. Take colace for constipation related to narcotic pain medication. Do not pick at the steristrips on your incision, these will remain in place until they are starting to peel off on their own. It is likely that you will form a seroma in the cavity where the cyst was, but do not be alarmed as this is likely given the size of the cavity. This will decrease in size and shrink down over time.   Epidermal Cyst Removal, Care After Refer to this sheet in the next few weeks. These instructions provide you with information about caring for yourself after your procedure. Your health care provider may also give you more specific instructions. Your treatment has been planned according to current medical practices, but problems sometimes occur. Call your health care provider if you have any problems or questions after your procedure. What can I expect after the procedure? After the procedure, it is common to have:  Soreness in the area where your cyst was removed.  Tightness or itching from your skin sutures.  Follow these instructions at home:  Take medicines only as directed by your health care provider.  If you were prescribed an antibiotic medicine, finish all of it even if you start to feel better.  Use antibiotic ointment as directed by your health care provider. Follow the instructions carefully.  There are many different ways to close and cover an incision, including stitches (sutures), skin glue, and adhesive strips. Follow your health care provider's instructions about: ? Incision care. ? Bandage (dressing) changes and removal. ? Incision closure removal.  Keep the bandage (dressing) dry until your health care provider says that it can be removed.  Take sponge baths only. Ask your health care provider when you can start showering or taking a bath.  After your dressing is off, check your incision every day for signs of infection. Watch for: ? Redness, swelling, or pain. ? Fluid, blood, or pus.  You can return to your normal activities. Do not do anything that stretches or puts pressure on your incision.  You can return to your normal diet.  Keep all follow-up visits as directed by your health care provider. This is important. Contact a health care provider if:  You have a fever.  Your incision bleeds.  You have redness, swelling, or pain in the incision area.  You have fluid, blood, or pus coming from your incision.  Your cyst comes back after surgery. This information is not intended to replace advice given to you by your health care provider. Make sure you discuss any questions you have with your health care provider. Document Released: 10/24/2014 Document Revised: 03/10/2016 Document Reviewed: 06/18/2014 Elsevier Interactive Patient Education  2018 [employer].   Seroma A seroma is a collection of fluid on the body that looks like swelling or a mass. Seromas form where tissue has been injured or cut. Seromas vary in size. Some are small and painless. Others may become large and cause pain or discomfort. Many seromas go away on their own as the fluid is naturally absorbed by the body, and some seromas need to be drained. What are the causes? Seromas form as the result of damage to tissue or the removal of tissue. This tissue damage may occur during surgery or because of an  injury or trauma. When tissue is disrupted or removed, empty space is created. The bodys natural defense system (immune system) causes fluid to enter the empty space and form a seroma. What are the signs or symptoms? Symptoms of this condition include:  Swelling at the site of a surgical cut (incision) or an injury.  Drainage of clear fluid at the  surgery or injury site.  Discomfort or pain.  How is this diagnosed? This condition is diagnosed based on your symptoms, your medical history, and a physical exam. During the exam, your health care provider will press on the seroma. You may also have tests, including:  Blood tests.  Imaging tests, such as an ultrasound or CT scan.  How is this treated? Some seromas go away (resolve) on their own. Your health care provider may monitor you to make sure the seroma does not cause any complications. If your seroma does not resolve on its own, treatment may include:  Using a needle to drain the fluid from the seroma (needle aspiration).  Inserting a flexible tube (catheter) to drain the fluid.  Applying a bandage (dressing), such as an elastic bandage or binder.  Antibiotic medicines, if the seroma becomes infected.  In rare cases, surgery may be done to remove the seroma and repair the area. Follow these instructions at home:  If you were prescribed an antibiotic medicine, take it as told by your health care provider. Do not stop taking the antibiotic even if you start to feel better.  Return to your normal activities as told by your health care provider. Ask your health care provider what activities are safe for you.  Take over-the-counter and prescription medicines only as told by your health care provider.  Check your seroma every day for signs of infection. Check for: ? Redness or pain. ? Fluid or pus. ? More swelling. ? Warmth.  Keep all follow-up visits as told by your health care provider. This is important. Contact a health care provider if:  You have a fever.  You have redness or pain at the site of the seroma.  You have fluid or pus coming from the seroma.  Your seroma is more swollen or is getting bigger.  Your seroma is warm to the touch. This information is not intended to replace advice given to you by your health care provider. Make sure you discuss any  questions you have with your health care provider. Document Released: 01/28/2013 Document Revised: 07/15/2016 Document Reviewed: 07/15/2016 Elsevier Interactive Patient Education  [employer].

## 2017-11-15 NOTE — Anesthesia Postprocedure Evaluation (Signed)
Anesthesia Post Note  Patient: Albert [name]  Procedure(s) Performed: EXCISION 4CM CYST ON NECK ([name] Neck)  Patient location during evaluation: PACU Anesthesia Type: General Level of consciousness: awake and alert, oriented and patient cooperative Pain management: pain level controlled Vital Signs Assessment: post-procedure vital signs reviewed and stable Respiratory status: spontaneous breathing and respiratory function stable Cardiovascular status: blood pressure returned to baseline and stable Postop Assessment: no headache, no backache and no apparent nausea or vomiting Anesthetic complications: no     Last Vitals:  Vitals:   11/15/17 0715 11/15/17 0720  BP: (!) 145/90 121/79  Pulse:    Resp: 12 18  Temp:    SpO2: 96% 96%    Last Pain:  Vitals:   11/15/17 0653  TempSrc: Oral  PainSc: 0-No pain                 Tyreek Clabo

## 2017-11-15 NOTE — Transfer of Care (Signed)
Immediate Anesthesia Transfer of Care Note  Patient: Albert [name]  Procedure(s) Performed: EXCISION 4CM CYST ON NECK ([name] Neck)  Patient Location: PACU  Anesthesia Type:General  Level of Consciousness: awake, alert , oriented and patient cooperative  Airway & Oxygen Therapy: Patient Spontanous Breathing and Patient connected to face mask oxygen  Post-op Assessment: Report given to RN and Post -op Vital signs reviewed and stable  Post vital signs: Reviewed and stable  Last Vitals:  Vitals:   11/15/17 0715 11/15/17 0720  BP: (!) 145/90 121/79  Pulse:    Resp: 12 18  Temp:    SpO2: 96% 96%    Last Pain:  Vitals:   11/15/17 0653  TempSrc: Oral  PainSc: 0-No pain         Complications: No apparent anesthesia complications

## 2017-11-16 ENCOUNTER — Encounter (HOSPITAL_COMMUNITY): Payer: Self-pay | Admitting: General Surgery

## 2017-11-30 ENCOUNTER — Ambulatory Visit: Payer: Medicare Other | Admitting: General Surgery

## 2017-12-05 ENCOUNTER — Encounter: Payer: Self-pay | Admitting: General Surgery

## 2017-12-05 ENCOUNTER — Ambulatory Visit (INDEPENDENT_AMBULATORY_CARE_PROVIDER_SITE_OTHER): Payer: Self-pay | Admitting: General Surgery

## 2017-12-05 VITALS — BP 145/76 | HR 79 | Temp 97.5°F | Resp 18 | Ht 71.0 in | Wt 272.0 lb

## 2017-12-05 DIAGNOSIS — L723 Sebaceous cyst: Secondary | ICD-10-CM

## 2017-12-05 NOTE — Progress Notes (Signed)
[facility] Note   HPI:  57 y.o. Male presents to clinic for post-op follow-up evaluation after a cyst removal on the back of his neck. Patient reports some pain this weekend but overall he has been good.  Review of Systems:  No fevers or chills No drainage All other review of systems: otherwise negative   Pathology: Diagnosis 1. Cyst, excision - BENIGN EPIDERMAL CYST - NO EVIDENCE OF MALIGNANCY. 2. Soft tissue, biopsy, fibrotic area below cyst - BENIGN COLLAGENOUS CONNECTIVE TISSUE AND ADIPOSE TISSUE. - NO EVIDENCE OF MALIGNANCY.  Vital Signs:  BP (!) 145/76   Pulse 79   Temp (!) 97.5 F (36.4 C)   Resp 18   Ht 5\' 11"  (1.803 m)   Wt 272 lb (123.4 kg)   BMI 37.94 kg/m    Physical Exam:  Physical Exam  Constitutional: He is well-developed, well-nourished, and in no distress.  HENT:  Head: Normocephalic.  Posterior neck incision c/d/i, some dry blood, no induration or erythema   Pulmonary/Chest: Effort normal.  Musculoskeletal: Normal range of motion.    Laboratory studies: None   Imaging:  None   Assessment:  57 y.o. yo Male s/p excision of a cyst. Pathology benign. Healing well.  Plan:  - PRN     All of the above recommendations were discussed with the patient and patient's family, and all of patient's and family's questions were answered to their expressed satisfaction.  Curlene Labrum, MD [facility] [address] [place], [place] [address] [phone] (office)  ]

## 2018-01-12 DIAGNOSIS — E1165 Type 2 diabetes mellitus with hyperglycemia: Secondary | ICD-10-CM | POA: Diagnosis not present

## 2018-01-12 DIAGNOSIS — I10 Essential (primary) hypertension: Secondary | ICD-10-CM | POA: Diagnosis not present

## 2018-01-12 DIAGNOSIS — E782 Mixed hyperlipidemia: Secondary | ICD-10-CM | POA: Diagnosis not present

## 2018-01-16 DIAGNOSIS — I2510 Atherosclerotic heart disease of native coronary artery without angina pectoris: Secondary | ICD-10-CM | POA: Diagnosis not present

## 2018-01-16 DIAGNOSIS — E1165 Type 2 diabetes mellitus with hyperglycemia: Secondary | ICD-10-CM | POA: Diagnosis not present

## 2018-01-16 DIAGNOSIS — I10 Essential (primary) hypertension: Secondary | ICD-10-CM | POA: Diagnosis not present

## 2018-01-16 DIAGNOSIS — S98919A Complete traumatic amputation of unspecified foot, level unspecified, initial encounter: Secondary | ICD-10-CM | POA: Diagnosis not present

## 2018-02-05 ENCOUNTER — Encounter (INDEPENDENT_AMBULATORY_CARE_PROVIDER_SITE_OTHER): Payer: Self-pay | Admitting: Orthopedic Surgery

## 2018-02-05 ENCOUNTER — Ambulatory Visit (INDEPENDENT_AMBULATORY_CARE_PROVIDER_SITE_OTHER): Payer: Medicare Other | Admitting: Orthopedic Surgery

## 2018-02-05 DIAGNOSIS — L97421 Non-pressure chronic ulcer of left heel and midfoot limited to breakdown of skin: Secondary | ICD-10-CM | POA: Diagnosis not present

## 2018-02-05 DIAGNOSIS — S88111A Complete traumatic amputation at level between knee and ankle, right lower leg, initial encounter: Secondary | ICD-10-CM

## 2018-02-05 DIAGNOSIS — L308 Other specified dermatitis: Secondary | ICD-10-CM

## 2018-02-05 NOTE — Progress Notes (Signed)
Office Visit Note   Patient: Albert [name]           Date of Birth: 1961/05/01           MRN: [id number] Visit Date: 02/05/2018              Requested by: Celene Squibb, MD [place], [place] [id number] PCP: Celene Squibb, MD  Chief Complaint  Patient presents with  . Left Foot - Follow-up  . Right Leg - Follow-up      HPI: Patient is a 42 right transtibial amputation with a dermatitis of the medial aspect the residual limb on the right.  Patient states he is tried alcohol without relief.  Patient also has a Waggoner grade 1 ulcer on the left transmetatarsal amputation.  Assessment & Plan: Visit Diagnoses:  1. Midfoot ulcer, left, limited to breakdown of skin ([place])   2. Amputated below knee, right ([place])   3. Dermatitis associated with moisture     Plan: Patient was given a prescription for a biotech for  Follow-Up Instructions: Return in about 1 month (around 03/05/2018).   Ortho Exam  Patient is alert, oriented, no adenopathy, well-dressed, normal affect, normal respiratory effort. Examination patient has a circular rash about 2 cm in diameter of the medial aspect of his transtibial amputation.  This may be a fungal rash.  There is no cellulitis no tenderness.  No ulcers on the residual limb.  Dementia left transmetatarsal amputation patient has a large Waggoner grade 1 ulcer.  After informed consent a 10 blade knife was used to debride the skin and soft tissue back to healthy viable granulation tissue there is no abscess no exposed bone or tendon.  Silver nitrate was used for hemostasis.  The ulcer is 2 cm in diameter.  Iodosorb 4 x 4 gauze and a Band-Aid was applied.  Imaging: No results found. No images are attached to the encounter.  Labs: Lab Results  Component Value Date   HGBA1C 7.7 (H) 11/10/2017   HGBA1C 9.1 (H) 02/08/2016   HGBA1C 8.2 (H) 05/16/2015   REPTSTATUS 02/13/2016 FINAL 02/08/2016   CULT NO GROWTH 5 DAYS 02/08/2016   LABORGA  STAPHYLOCOCCUS AUREUS 06/26/2013    @LABSALLVALUES (HGBA1)@  There is no height or weight on file to calculate BMI.  Orders:  No orders of the defined types were placed in this encounter.  No orders of the defined types were placed in this encounter.    Procedures: No procedures performed  Clinical Data: No additional findings.  ROS:  All other systems negative, except as noted in the HPI. Review of Systems  Objective: Vital Signs: There were no vitals taken for this visit.  Specialty Comments:  No specialty comments available.  PMFS History: Patient Active Problem List   Diagnosis Date Noted  . Sebaceous cyst   . Amputated below knee, right ([place]) 10/19/2017  . History of transmetatarsal amputation of left foot ([place]) 06/12/2017  . Special screening for malignant neoplasms, colon   . Midfoot ulcer, left, limited to breakdown of skin ([place]) 11/18/2016  . ARF (acute renal failure) ([place]) 02/08/2016  . Chronic combined systolic and diastolic CHF (congestive heart failure) ([place]) 02/08/2016  . Splenic infarct 01/06/2014  . Acute systolic CHF (congestive heart failure) ([place]) 08/03/2013  . Hx of right BKA ([place]) 07/26/2013  . Anemia 07/15/2013  . Hyperkalemia 07/15/2013  . Hyposmolality and/or hyponatremia 07/15/2013  . Acute respiratory failure ([place]) 07/15/2013  . Mitral regurgitation 07/15/2013  .  Oliguria 07/15/2013  . Bacteremia due to Staphylococcus aureus 07/15/2013  . Hyperchloremia 07/15/2013  . Acute kidney failure ([place]) 07/14/2013  . Hypotension, unspecified 07/14/2013  . Acute systolic heart failure ([place]) 07/12/2013  . Acute pulmonary edema ([place]) 07/11/2013  . Pneumonia, organism unspecified(486) 07/11/2013  . Hypoxemia 07/11/2013  . Cardiomyopathy, ischemic 07/03/2013  . Coronary atherosclerosis of native coronary artery 07/01/2013  . NSTEMI (non-ST elevated myocardial infarction) ([place]) 06/26/2013  . AKI (acute kidney injury) ([place]) 06/25/2013  . Shock  circulatory ([place]) 06/25/2013  . Dehydration with hyponatremia 06/25/2013  . Leukocytosis, unspecified 06/25/2013  . Sepsis(995.91) 06/25/2013  . Aftercare following surgery of the circulatory system, [place] 02/11/2013  . Peripheral vascular disease, unspecified ([place]) 12/31/2012  . Diabetes mellitus out of control ([place]) 12/17/2012  . Essential hypertension, benign 12/17/2012  . Atherosclerotic peripheral vascular disease ([place]) 12/17/2012  . Hypercholesteremia    Past Medical History:  Diagnosis Date  . Allergic rhinitis   . Anemia    takes supplement  . Chronic cough   . Chronic sinusitis   . Complication of anesthesia    couldn't swallow and talk  . Coronary artery disease   . CVA (cerebral infarction) 2011   R hand deficit  . Diabetes mellitus without complication ([place]) ~4268   last HbA1c ~9 .... fasting 160s  . GERD (gastroesophageal reflux disease)   . Heart murmur   . Hypercholesteremia   . Hypertension   . Myocardial infarction ([place]) 2014  . Osteomyelitis ([place])    first and third metatarsal  . Pneumonia    hx of walking  . Splenic infarct    setting of lupus anticoagulant  . Stroke [facility]) 2011   right arm weakness    Family History  Problem Relation Age of Onset  . Pancreatic cancer Mother   . Diabetes Mother   . Hyperlipidemia Mother   . Diabetes Unknown   . Breast cancer Sister   . Depression Maternal Grandmother   . Heart disease Maternal Grandmother   . Depression Maternal Grandfather   . Heart disease Maternal Grandfather   . Depression Paternal Grandmother   . Heart disease Paternal Grandmother   . Depression Paternal Grandfather   . Heart disease Paternal Grandfather     Past Surgical History:  Procedure Laterality Date  . 4th Toe Amputation Left Jan. 2014   4th toe in [place]  . AMPUTATION Left 12/08/2012   Procedure: AMPUTATION DIGIT;  Surgeon: Mcarthur Rossetti, MD;  Location: [place];  Service: Orthopedics;  Laterality: Left;  3rd toe amputation  possible 5th toe amputation  . AMPUTATION Left 12/11/2012   Procedure: Amputation of 2nd Toe;  Surgeon: Mcarthur Rossetti, MD;  Location: [facility];  Service: Orthopedics;  Laterality: Left;  . AMPUTATION Right 06/29/2013   Procedure: AMPUTATION BELOW KNEE;  Surgeon: Newt Minion, MD;  Location: [facility];  Service: Orthopedics;  Laterality: Right;  . AMPUTATION Left 08/29/2014   Procedure: Left Great Toe Amputation at MTP;  Surgeon: Newt Minion, MD;  Location: [facility];  Service: Orthopedics;  Laterality: Left;  . AMPUTATION Left 10/31/2014   Procedure: Midfoot Amputation;  Surgeon: Newt Minion, MD;  Location: [place];  Service: Orthopedics;  Laterality: Left;  . BYPASS GRAFT POPLITEAL TO TIBIAL Left 12/13/2012   Procedure: BYPASS GRAFT POPLITEAL TO TIBIAL;  Surgeon: Serafina Mitchell, MD;  [facility];  Service: Vascular;  Laterality: Left;  Left Popliteal to Posterior Tibial Bypass Graft with reversed saphenous vein graft  .  CARDIAC CATHETERIZATION    . COLONOSCOPY [name] 04/14/2017   Procedure: COLONOSCOPY;  Surgeon: Danie Binder, MD;  [facility];  Service: Endoscopy;  Laterality: [name];  1000  . I&D EXTREMITY Left 12/08/2012   Procedure: IRRIGATION AND DEBRIDEMENT EXTREMITY;  Surgeon: Mcarthur Rossetti, MD;  Location: [facility];  Service: Orthopedics;  Laterality: Left;  . I&D EXTREMITY Left 12/11/2012   Procedure: REPEAT I&D LEFT FOOT;  Surgeon: Mcarthur Rossetti, MD;  Location: [facility];  Service: Orthopedics;  Laterality: Left;  . INCISION AND DRAINAGE Right 06/25/2013   Procedure: INCISION AND DRAINAGE RIGHT FOOT;  Surgeon: Mcarthur Rossetti, MD;  Location: [facility];  Service: Orthopedics;  Laterality: Right;  . LEFT HEART CATHETERIZATION WITH CORONARY ANGIOGRAM [name] 06/28/2013   Procedure: LEFT HEART CATHETERIZATION WITH CORONARY ANGIOGRAM;  Surgeon: Burnell Blanks, MD;  Location: [facility];  Service: Cardiovascular;  Laterality: [name];  . MASS EXCISION [name] 11/15/2017    Procedure: EXCISION 4CM CYST ON NECK;  Surgeon: Virl Cagey, MD;  Location: [facility];  Service: General;  Laterality: [name];  . PENILE PROSTHESIS IMPLANT    . POLYPECTOMY  04/14/2017   Procedure: POLYPECTOMY;  Surgeon: Danie Binder, MD;  [facility];  Service: Endoscopy;;  colon  . STUMP REVISION Right 08/16/2013   Procedure: STUMP REVISION- right Below Knee Amputation;  Surgeon: Newt Minion, MD;  Location: [facility];  Service: Orthopedics;  Laterality: Right;  . TEE WITHOUT CARDIOVERSION [name] 07/03/2013   Procedure: TRANSESOPHAGEAL ECHOCARDIOGRAM (TEE);  Surgeon: Larey Dresser, MD;  Location: [place];  Service: Cardiovascular;  Laterality: [name];  . TONSILLECTOMY AND ADENOIDECTOMY     Social History   Occupational History  . 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 Smoking status: Never Smoker  . Smokeless tobacco: Never Used  Substance and Sexual Activity  . Alcohol use: No    Alcohol/week: 0.0 oz  . Drug use: No  . Sexual activity: Never

## 2018-03-08 ENCOUNTER — Ambulatory Visit (INDEPENDENT_AMBULATORY_CARE_PROVIDER_SITE_OTHER): Payer: Medicare Other | Admitting: Orthopedic Surgery

## 2018-04-02 ENCOUNTER — Encounter (INDEPENDENT_AMBULATORY_CARE_PROVIDER_SITE_OTHER): Payer: Self-pay | Admitting: Orthopedic Surgery

## 2018-04-02 ENCOUNTER — Ambulatory Visit (INDEPENDENT_AMBULATORY_CARE_PROVIDER_SITE_OTHER): Payer: Medicare Other | Admitting: Orthopedic Surgery

## 2018-04-02 VITALS — Ht 71.0 in | Wt 272.0 lb

## 2018-04-02 DIAGNOSIS — L97421 Non-pressure chronic ulcer of left heel and midfoot limited to breakdown of skin: Secondary | ICD-10-CM

## 2018-04-02 DIAGNOSIS — Z89432 Acquired absence of left foot: Secondary | ICD-10-CM

## 2018-04-02 NOTE — Progress Notes (Signed)
Office Visit Note   Patient: Albert [name]           Date of Birth: [birth year]           MRN: [id number] Visit Date: 04/02/2018              Requested by: Celene Squibb, MD [place], [place] [address] PCP: Celene Squibb, MD  Chief Complaint  Patient presents with  . Left Foot - Follow-up      HPI: Patient is a 57 year old gentleman who presents with a left foot midfoot amputation with a chronic ulcer over the midfoot.  Patient is currently wearing extra-depth shoes custom orthotics.  He is status post a right transtibial amputation.  Has no complaints from his right leg.  Assessment & Plan: Visit Diagnoses:  1. Midfoot ulcer, left, limited to breakdown of skin ([place])   2. History of transmetatarsal amputation of left foot ([place])     Plan: Recommended routine follow-up for ulcer evaluation.  Patient will continue with his daily dressing changes to protect the wound.  Follow-Up Instructions: Return in about 2 months (around 06/02/2018).   Ortho Exam  Patient is alert, oriented, no adenopathy, well-dressed, normal affect, normal respiratory effort. Examination patient has good custom orthotics extra-depth shoes he has dorsiflexion to neutral with no significant heel cord contracture.  He has an ulcer beneath the third metatarsal.  After informed consent a 10 blade knife was used to debride the skin and soft tissue back to healthy viable tissue.  The ulcer is 15 mm in diameter and 2 mm deep.  Silver nitrate was used for hemostasis.  A dressing was applied.  There is no redness no cellulitis there is good granulation tissue this was touched with silver nitrate there is no signs of infection.  Imaging: No results found. No images are attached to the encounter.  Labs: Lab Results  Component Value Date   HGBA1C 7.7 (H) 11/10/2017   HGBA1C 9.1 (H) 02/08/2016   HGBA1C 8.2 (H) 05/16/2015   REPTSTATUS 02/13/2016 FINAL 02/08/2016   CULT NO GROWTH 5 DAYS 02/08/2016   LABORGA STAPHYLOCOCCUS AUREUS 06/26/2013     Lab Results  Component Value Date   ALBUMIN 3.1 (L) 05/17/2015   ALBUMIN 4.0 10/31/2014   ALBUMIN 4.1 08/28/2014    Body mass index is 37.94 kg/m.  Orders:  No orders of the defined types were placed in this encounter.  No orders of the defined types were placed in this encounter.    Procedures: No procedures performed  Clinical Data: No additional findings.  ROS:  All other systems negative, except as noted in the HPI. Review of Systems  Objective: Vital Signs: Ht 5\' 11"  (1.803 m)   Wt 272 lb (123.4 kg)   BMI 37.94 kg/m   Specialty Comments:  No specialty comments available.  PMFS History: Patient Active Problem List   Diagnosis Date Noted  . Sebaceous cyst   . Amputated below knee, right ([place]) 10/19/2017  . History of transmetatarsal amputation of left foot ([place]) 06/12/2017  . Special screening for malignant neoplasms, colon   . Midfoot ulcer, left, limited to breakdown of skin ([place]) 11/18/2016  . ARF (acute renal failure) ([place]) 02/08/2016  . Chronic combined systolic and diastolic CHF (congestive heart failure) ([place]) 02/08/2016  . Splenic infarct 01/06/2014  . Acute systolic CHF (congestive heart failure) ([place]) 08/03/2013  . Hx of right BKA ([place]) 07/26/2013  . Anemia 07/15/2013  . Hyperkalemia 07/15/2013  .  Hyposmolality and/or hyponatremia 07/15/2013  . Acute respiratory failure ([place]) 07/15/2013  . Mitral regurgitation 07/15/2013  . Oliguria 07/15/2013  . Bacteremia due to Staphylococcus aureus 07/15/2013  . Hyperchloremia 07/15/2013  . Acute kidney failure ([place]) 07/14/2013  . Hypotension, unspecified 07/14/2013  . Acute systolic heart failure ([place]) 07/12/2013  . Acute pulmonary edema ([place]) 07/11/2013  . Pneumonia, organism unspecified(486) 07/11/2013  . Hypoxemia 07/11/2013  . Cardiomyopathy, ischemic 07/03/2013  . Coronary atherosclerosis of native coronary artery 07/01/2013  . NSTEMI (non-ST  elevated myocardial infarction) ([place]) 06/26/2013  . AKI (acute kidney injury) ([place]) 06/25/2013  . Shock circulatory ([place]) 06/25/2013  . Dehydration with hyponatremia 06/25/2013  . Leukocytosis, unspecified 06/25/2013  . Sepsis(995.91) 06/25/2013  . Aftercare following surgery of the circulatory system, [place] 02/11/2013  . Peripheral vascular disease, unspecified ([place]) 12/31/2012  . Diabetes mellitus out of control ([place]) 12/17/2012  . Essential hypertension, benign 12/17/2012  . Atherosclerotic peripheral vascular disease ([place]) 12/17/2012  . Hypercholesteremia    Past Medical History:  Diagnosis Date  . Allergic rhinitis   . Anemia    takes supplement  . Chronic cough   . Chronic sinusitis   . Complication of anesthesia    couldn't swallow and talk  . Coronary artery disease   . CVA (cerebral infarction) 2011   R hand deficit  . Diabetes mellitus without complication ([place]) ~8850   last HbA1c ~9 .... fasting 160s  . GERD (gastroesophageal reflux disease)   . Heart murmur   . Hypercholesteremia   . Hypertension   . Myocardial infarction ([place]) 2014  . Osteomyelitis ([place])    first and third metatarsal  . Pneumonia    hx of walking  . Splenic infarct    setting of lupus anticoagulant  . Stroke [facility]) 2011   right arm weakness    Family History  Problem Relation Age of Onset  . Pancreatic cancer Mother   . Diabetes Mother   . Hyperlipidemia Mother   . Diabetes Unknown   . Breast cancer Sister   . Depression Maternal Grandmother   . Heart disease Maternal Grandmother   . Depression Maternal Grandfather   . Heart disease Maternal Grandfather   . Depression Paternal Grandmother   . Heart disease Paternal Grandmother   . Depression Paternal Grandfather   . Heart disease Paternal Grandfather     Past Surgical History:  Procedure Laterality Date  . 4th Toe Amputation Left Jan. 2014   4th toe in [place]  . AMPUTATION Left 12/08/2012   Procedure: AMPUTATION DIGIT;  Surgeon:  Mcarthur Rossetti, MD;  Location: [place];  Service: Orthopedics;  Laterality: Left;  3rd toe amputation possible 5th toe amputation  . AMPUTATION Left 12/11/2012   Procedure: Amputation of 2nd Toe;  Surgeon: Mcarthur Rossetti, MD;  Location: [place];  Service: Orthopedics;  Laterality: Left;  . AMPUTATION Right 06/29/2013   Procedure: AMPUTATION BELOW KNEE;  Surgeon: Newt Minion, MD;  Location: [place];  Service: Orthopedics;  Laterality: Right;  . AMPUTATION Left 08/29/2014   Procedure: Left Great Toe Amputation at MTP;  Surgeon: Newt Minion, MD;  Location: [place];  Service: Orthopedics;  Laterality: Left;  . AMPUTATION Left 10/31/2014   Procedure: Midfoot Amputation;  Surgeon: Newt Minion, MD;  Location: [place];  Service: Orthopedics;  Laterality: Left;  . BYPASS GRAFT POPLITEAL TO TIBIAL Left 12/13/2012   Procedure: BYPASS GRAFT POPLITEAL TO TIBIAL;  Surgeon: Serafina Mitchell, MD;  Location: [place];  Service:  Vascular;  Laterality: Left;  Left Popliteal to Posterior Tibial Bypass Graft with reversed saphenous vein graft  . CARDIAC CATHETERIZATION    . COLONOSCOPY [name] 04/14/2017   Procedure: COLONOSCOPY;  Surgeon: Danie Binder, MD;  [facility];  Service: Endoscopy;  Laterality: [name];  1000  . I&D EXTREMITY Left 12/08/2012   Procedure: IRRIGATION AND DEBRIDEMENT EXTREMITY;  Surgeon: Mcarthur Rossetti, MD;  Location: [place];  Service: Orthopedics;  Laterality: Left;  . I&D EXTREMITY Left 12/11/2012   Procedure: REPEAT I&D LEFT FOOT;  Surgeon: Mcarthur Rossetti, MD;  Location: [place];  Service: Orthopedics;  Laterality: Left;  . INCISION AND DRAINAGE Right 06/25/2013   Procedure: INCISION AND DRAINAGE RIGHT FOOT;  Surgeon: Mcarthur Rossetti, MD;  [facility];  Service: Orthopedics;  Laterality: Right;  . LEFT HEART CATHETERIZATION WITH CORONARY ANGIOGRAM [name] 06/28/2013   Procedure: LEFT HEART CATHETERIZATION WITH CORONARY ANGIOGRAM;  Surgeon: Burnell Blanks,  MD;  [facility];  Service: Cardiovascular;  Laterality: [name];  . MASS EXCISION [name] 11/15/2017   Procedure: EXCISION 4CM CYST ON NECK;  Surgeon: Virl Cagey, MD;  [facility];  Service: General;  Laterality: [name];  . PENILE PROSTHESIS IMPLANT    . POLYPECTOMY  04/14/2017   Procedure: POLYPECTOMY;  Surgeon: Danie Binder, MD;  [facility];  Service: Endoscopy;;  colon  . STUMP REVISION Right 08/16/2013   Procedure: STUMP REVISION- right Below Knee Amputation;  Surgeon: Newt Minion, MD;  Location: [place];  Service: Orthopedics;  Laterality: Right;  . TEE WITHOUT CARDIOVERSION [name] 07/03/2013   Procedure: TRANSESOPHAGEAL ECHOCARDIOGRAM (TEE);  Surgeon: Larey Dresser, MD;  Location: [place];  Service: Cardiovascular;  Laterality: [name];  . TONSILLECTOMY AND ADENOIDECTOMY     Social History   Occupational History  . 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 Smoking status: Never Smoker  . Smokeless tobacco: Never Used  Substance and Sexual Activity  . Alcohol use: No    Alcohol/week: 0.0 oz  . Drug use: No  . Sexual activity: Never

## 2018-04-09 ENCOUNTER — Telehealth: Payer: Self-pay | Admitting: *Deleted

## 2018-04-09 NOTE — Telephone Encounter (Signed)
Received call from [facility] nurse to report 8 lb weight gain in 2 days for patient. No other symptoms reported. Awaiting weight data.  Patient contacted and states that his baseline weight is around 263 lbs. Patient said that his weight today is 268 lbs & yesterday 265 lbs. Patient said that on 04/06/18 weight was 261 lbs. Patient confirmed that he is taking furosemide 80 mg daily & K+ 10 meq daily. Patient denies having swelling in LE, denies sob, chest pain and denies problems sleeping. Patient said he feels find and is doing is normal routine without difficulties.   Denies adding salt to foods and denies drinking extra fluids.  Admitted to eating popcorn at the movie theater on Friday night. Also said that he has been sitting in church a lot and he retains fluid when he does a lot of sitting.

## 2018-04-09 NOTE — Telephone Encounter (Signed)
Take lasix 80mg  bid today and tomorrow, update [place] on weights Wed   Zandra Abts MD

## 2018-04-09 NOTE — Telephone Encounter (Signed)
Patient informed and verbalized understanding of plan. 

## 2018-04-10 ENCOUNTER — Telehealth: Payer: Self-pay | Admitting: *Deleted

## 2018-04-10 NOTE — Telephone Encounter (Signed)
Patient made aware and voiced understanding - updated medication list

## 2018-04-10 NOTE — Telephone Encounter (Signed)
Nurse from [facility] called and says pt weight is down to baseline loss of 7lbs overnight - took Lasix 80 mg bid yesterday and took morning dose of lasix today - wanted to know if extra dose of lasix was needed this afternoon with weight loss and c/o leg cramps - currently on potassium 10 meq daily

## 2018-04-10 NOTE — Telephone Encounter (Signed)
No, can go back to his prior dosing of lasix, verify he had been on lasix 80mg  just once daily. On his own for weight gain can take extra 40mg  in the evenings as needed.    Zandra Abts MD

## 2018-04-20 DIAGNOSIS — E119 Type 2 diabetes mellitus without complications: Secondary | ICD-10-CM | POA: Diagnosis not present

## 2018-04-20 DIAGNOSIS — E782 Mixed hyperlipidemia: Secondary | ICD-10-CM | POA: Diagnosis not present

## 2018-04-20 DIAGNOSIS — R110 Nausea: Secondary | ICD-10-CM | POA: Diagnosis not present

## 2018-04-20 DIAGNOSIS — E876 Hypokalemia: Secondary | ICD-10-CM | POA: Diagnosis not present

## 2018-04-23 DIAGNOSIS — E782 Mixed hyperlipidemia: Secondary | ICD-10-CM | POA: Diagnosis not present

## 2018-04-23 DIAGNOSIS — R944 Abnormal results of kidney function studies: Secondary | ICD-10-CM | POA: Diagnosis not present

## 2018-04-23 DIAGNOSIS — I10 Essential (primary) hypertension: Secondary | ICD-10-CM | POA: Diagnosis not present

## 2018-04-23 DIAGNOSIS — R110 Nausea: Secondary | ICD-10-CM | POA: Diagnosis not present

## 2018-04-23 DIAGNOSIS — E1165 Type 2 diabetes mellitus with hyperglycemia: Secondary | ICD-10-CM | POA: Diagnosis not present

## 2018-05-02 ENCOUNTER — Ambulatory Visit (INDEPENDENT_AMBULATORY_CARE_PROVIDER_SITE_OTHER): Payer: Medicare Other | Admitting: Family

## 2018-05-02 ENCOUNTER — Encounter (INDEPENDENT_AMBULATORY_CARE_PROVIDER_SITE_OTHER): Payer: Self-pay | Admitting: Family

## 2018-05-02 DIAGNOSIS — Z89432 Acquired absence of left foot: Secondary | ICD-10-CM

## 2018-05-02 DIAGNOSIS — L97421 Non-pressure chronic ulcer of left heel and midfoot limited to breakdown of skin: Secondary | ICD-10-CM

## 2018-05-02 NOTE — Progress Notes (Signed)
Office Visit Note   Patient: Albert [name]           Date of Birth: 1961-05-28           MRN: [id number] Visit Date: 05/02/2018              Requested by: Celene Squibb, MD [place], [place] [address] PCP: Celene Squibb, MD  Chief Complaint  Patient presents with  . Left Foot - Follow-up      HPI: Patient is a 57 year old gentleman who presents with a left foot midfoot amputation with a chronic ulcer over the midfoot.  Patient is currently wearing extra-depth shoes custom orthotics.  He is status post a right transtibial amputation.  Has no complaints from his right leg.  Assessment & Plan: Visit Diagnoses:  No diagnosis found.  Plan: Recommended routine follow-up for ulcer evaluation.  Patient will continue with his daily dressing changes to protect the wound. Recommended he follow with [facility] for modifications to orthotic to offload the ulcer.  Follow-Up Instructions: No follow-ups on file.   Ortho Exam  Patient is alert, oriented, no adenopathy, well-dressed, normal affect, normal respiratory effort. Examination patient has good custom orthotics extra-depth shoes he has dorsiflexion to neutral with no significant heel cord contracture.  He has an ulcer beneath the third metatarsal distally.  After informed consent a 10 blade knife was used to debride the skin and soft tissue back to healthy viable tissue.  The ulcer is 15 mm in diameter and 1 mm deep.  A dressing was applied. There is no redness no cellulitis. there is no signs of infection.  Imaging: No results found. No images are attached to the encounter.  Labs: Lab Results  Component Value Date   HGBA1C 7.7 (H) 11/10/2017   HGBA1C 9.1 (H) 02/08/2016   HGBA1C 8.2 (H) 05/16/2015   REPTSTATUS 02/13/2016 FINAL 02/08/2016   CULT NO GROWTH 5 DAYS 02/08/2016   LABORGA STAPHYLOCOCCUS AUREUS 06/26/2013     Lab Results  Component Value Date   ALBUMIN 3.1 (L) 05/17/2015   ALBUMIN 4.0 10/31/2014   ALBUMIN 4.1 08/28/2014    There is no height or weight on file to calculate BMI.  Orders:  No orders of the defined types were placed in this encounter.  No orders of the defined types were placed in this encounter.    Procedures: No procedures performed  Clinical Data: No additional findings.  ROS:  All other systems negative, except as noted in the HPI. Review of Systems  Constitutional: Negative for chills and fever.  Skin: Positive for color change. Negative for wound.    Objective: Vital Signs: There were no vitals taken for this visit.  Specialty Comments:  No specialty comments available.  PMFS History: Patient Active Problem List   Diagnosis Date Noted  . Sebaceous cyst   . Amputated below knee, right ([place]) 10/19/2017  . History of transmetatarsal amputation of left foot ([place]) 06/12/2017  . Special screening for malignant neoplasms, colon   . Midfoot ulcer, left, limited to breakdown of skin ([place]) 11/18/2016  . ARF (acute renal failure) ([place]) 02/08/2016  . Chronic combined systolic and diastolic CHF (congestive heart failure) ([place]) 02/08/2016  . Splenic infarct 01/06/2014  . Acute systolic CHF (congestive heart failure) ([place]) 08/03/2013  . Hx of right BKA ([place]) 07/26/2013  . Anemia 07/15/2013  . Hyperkalemia 07/15/2013  . Hyposmolality and/or hyponatremia 07/15/2013  . Acute respiratory failure ([place]) 07/15/2013  . Mitral  regurgitation 07/15/2013  . Oliguria 07/15/2013  . Bacteremia due to Staphylococcus aureus 07/15/2013  . Hyperchloremia 07/15/2013  . Acute kidney failure ([place]) 07/14/2013  . Hypotension, unspecified 07/14/2013  . Acute systolic heart failure ([place]) 07/12/2013  . Acute pulmonary edema ([place]) 07/11/2013  . Pneumonia, organism unspecified(486) 07/11/2013  . Hypoxemia 07/11/2013  . Cardiomyopathy, ischemic 07/03/2013  . Coronary atherosclerosis of native coronary artery 07/01/2013  . NSTEMI (non-ST elevated myocardial infarction) ([place])  06/26/2013  . AKI (acute kidney injury) ([place]) 06/25/2013  . Shock circulatory ([place]) 06/25/2013  . Dehydration with hyponatremia 06/25/2013  . Leukocytosis, unspecified 06/25/2013  . Sepsis(995.91) 06/25/2013  . Aftercare following surgery of the circulatory system, [place] 02/11/2013  . Peripheral vascular disease, unspecified ([place]) 12/31/2012  . Diabetes mellitus out of control ([place]) 12/17/2012  . Essential hypertension, benign 12/17/2012  . Atherosclerotic peripheral vascular disease ([place]) 12/17/2012  . Hypercholesteremia    Past Medical History:  Diagnosis Date  . Allergic rhinitis   . Anemia    takes supplement  . Chronic cough   . Chronic sinusitis   . Complication of anesthesia    couldn't swallow and talk  . Coronary artery disease   . CVA (cerebral infarction) 2011   R hand deficit  . Diabetes mellitus without complication ([place]) ~1517   last HbA1c ~9 .... fasting 160s  . GERD (gastroesophageal reflux disease)   . Heart murmur   . Hypercholesteremia   . Hypertension   . Myocardial infarction ([place]) 2014  . Osteomyelitis ([place])    first and third metatarsal  . Pneumonia    hx of walking  . Splenic infarct    setting of lupus anticoagulant  . Stroke [facility]) 2011   right arm weakness    Family History  Problem Relation Age of Onset  . Pancreatic cancer Mother   . Diabetes Mother   . Hyperlipidemia Mother   . Diabetes Unknown   . Breast cancer Sister   . Depression Maternal Grandmother   . Heart disease Maternal Grandmother   . Depression Maternal Grandfather   . Heart disease Maternal Grandfather   . Depression Paternal Grandmother   . Heart disease Paternal Grandmother   . Depression Paternal Grandfather   . Heart disease Paternal Grandfather     Past Surgical History:  Procedure Laterality Date  . 4th Toe Amputation Left Jan. 2014   4th toe in [place]  . AMPUTATION Left 12/08/2012   Procedure: AMPUTATION DIGIT;  Surgeon: Mcarthur Rossetti, MD;  Location:  [place];  Service: Orthopedics;  Laterality: Left;  3rd toe amputation possible 5th toe amputation  . AMPUTATION Left 12/11/2012   Procedure: Amputation of 2nd Toe;  Surgeon: Mcarthur Rossetti, MD;  Location: [place];  Service: Orthopedics;  Laterality: Left;  . AMPUTATION Right 06/29/2013   Procedure: AMPUTATION BELOW KNEE;  Surgeon: Newt Minion, MD;  Location: [place];  Service: Orthopedics;  Laterality: Right;  . AMPUTATION Left 08/29/2014   Procedure: Left Great Toe Amputation at MTP;  Surgeon: Newt Minion, MD;  Location: [place];  Service: Orthopedics;  Laterality: Left;  . AMPUTATION Left 10/31/2014   Procedure: Midfoot Amputation;  Surgeon: Newt Minion, MD;  Location: [place];  Service: Orthopedics;  Laterality: Left;  . BYPASS GRAFT POPLITEAL TO TIBIAL Left 12/13/2012   Procedure: BYPASS GRAFT POPLITEAL TO TIBIAL;  Surgeon: Serafina Mitchell, MD;  Location: [place] [facility];  Service: Vascular;  Laterality: Left;  Left Popliteal to Posterior Tibial Bypass Graft with reversed  saphenous vein graft  . CARDIAC CATHETERIZATION    . COLONOSCOPY [name] 04/14/2017   Procedure: COLONOSCOPY;  Surgeon: Danie Binder, MD;  [facility];  Service: Endoscopy;  Laterality: [name];  1000  . I&D EXTREMITY Left 12/08/2012   Procedure: IRRIGATION AND DEBRIDEMENT EXTREMITY;  Surgeon: Mcarthur Rossetti, MD;  Location: [place];  Service: Orthopedics;  Laterality: Left;  . I&D EXTREMITY Left 12/11/2012   Procedure: REPEAT I&D LEFT FOOT;  Surgeon: Mcarthur Rossetti, MD;  Location: [place];  Service: Orthopedics;  Laterality: Left;  . INCISION AND DRAINAGE Right 06/25/2013   Procedure: INCISION AND DRAINAGE RIGHT FOOT;  Surgeon: Mcarthur Rossetti, MD;  [facility];  Service: Orthopedics;  Laterality: Right;  . LEFT HEART CATHETERIZATION WITH CORONARY ANGIOGRAM [name] 06/28/2013   Procedure: LEFT HEART CATHETERIZATION WITH CORONARY ANGIOGRAM;  Surgeon: Burnell Blanks, MD;  Location: [facility] CATH LAB;  Service:  Cardiovascular;  Laterality: [name];  . MASS EXCISION [name] 11/15/2017   Procedure: EXCISION 4CM CYST ON NECK;  Surgeon: Virl Cagey, MD;  [facility];  Service: General;  Laterality: [name];  . PENILE PROSTHESIS IMPLANT    . POLYPECTOMY  04/14/2017   Procedure: POLYPECTOMY;  Surgeon: Danie Binder, MD;  [facility];  Service: Endoscopy;;  colon  . STUMP REVISION Right 08/16/2013   Procedure: STUMP REVISION- right Below Knee Amputation;  Surgeon: Newt Minion, MD;  Location: [place];  Service: Orthopedics;  Laterality: Right;  . TEE WITHOUT CARDIOVERSION [name] 07/03/2013   Procedure: TRANSESOPHAGEAL ECHOCARDIOGRAM (TEE);  Surgeon: Larey Dresser, MD;  Location: [place];  Service: Cardiovascular;  Laterality: [name];  . TONSILLECTOMY AND ADENOIDECTOMY     Social History   Occupational History  . 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 Smoking status: Never Smoker  . Smokeless tobacco: Never Used  Substance and Sexual Activity  . Alcohol use: No    Alcohol/week: 0.0 oz  . Drug use: No  . Sexual activity: Never

## 2018-05-16 ENCOUNTER — Other Ambulatory Visit: Payer: Self-pay | Admitting: Cardiology

## 2018-06-04 ENCOUNTER — Encounter (INDEPENDENT_AMBULATORY_CARE_PROVIDER_SITE_OTHER): Payer: Self-pay | Admitting: Orthopedic Surgery

## 2018-06-04 ENCOUNTER — Ambulatory Visit (INDEPENDENT_AMBULATORY_CARE_PROVIDER_SITE_OTHER): Payer: Medicare Other | Admitting: Orthopedic Surgery

## 2018-06-04 VITALS — Ht 71.0 in | Wt 272.0 lb

## 2018-06-04 DIAGNOSIS — L97421 Non-pressure chronic ulcer of left heel and midfoot limited to breakdown of skin: Secondary | ICD-10-CM | POA: Diagnosis not present

## 2018-06-04 DIAGNOSIS — Z89432 Acquired absence of left foot: Secondary | ICD-10-CM | POA: Diagnosis not present

## 2018-06-04 NOTE — Progress Notes (Signed)
Office Visit Note   Patient: Albert [name]           Date of Birth: 29-Nov-1960           MRN: [id number] Visit Date: 06/04/2018              Requested by: Celene Squibb, MD [place], [place] [id number] PCP: Celene Squibb, MD  Chief Complaint  Patient presents with  . Left Foot - Follow-up    10/31/14  Left Midfoot Amputation      HPI: Patient is a 57 year old gentleman who is seen for follow-up of a Wegner grade 1 ulcer left transmetatarsal amputation.  Patient states that his shoe wear is over 70 years old.  Assessment & Plan: Visit Diagnoses:  1. Impingement of right ankle joint     Plan: Patient is given a prescription for biotech for extra-depth shoes orthotic spacer and a carbon plate.  He will continue with dry dressing changes daily discussed that he does not need the Santyl ointment dressing changes.  Follow-Up Instructions: Return in about 3 weeks (around 06/25/2018).   Ortho Exam  Patient is alert, oriented, no adenopathy, well-dressed, normal affect, normal respiratory effort. Examination patient has a good dorsalis pedis pulse his foot is plantigrade there is no significant venous swelling there are no plantar ulcers he does have an ulcer over the distal aspect of the transmetatarsal amputation.  After informed consent a 10 blade knife was used to debride the skin and soft tissue back to healthy viable tissue.  The ulcer is 2 cm in diameter 3 mm deep there is no exposed bone or tendon no drainage no cellulitis no odor.  Imaging: No results found. No images are attached to the encounter.  Labs: Lab Results  Component Value Date   HGBA1C 7.7 (H) 11/10/2017   HGBA1C 9.1 (H) 02/08/2016   HGBA1C 8.2 (H) 05/16/2015   REPTSTATUS 02/13/2016 FINAL 02/08/2016   CULT NO GROWTH 5 DAYS 02/08/2016   LABORGA STAPHYLOCOCCUS AUREUS 06/26/2013     Lab Results  Component Value Date   ALBUMIN 3.1 (L) 05/17/2015   ALBUMIN 4.0 10/31/2014   ALBUMIN 4.1  08/28/2014    Body mass index is 37.94 kg/m.  Orders:  No orders of the defined types were placed in this encounter.  No orders of the defined types were placed in this encounter.    Procedures: No procedures performed  Clinical Data: No additional findings.  ROS:  All other systems negative, except as noted in the HPI. Review of Systems  Objective: Vital Signs: Ht 5\' 11"  (1.803 m)   Wt 272 lb (123.4 kg)   BMI 37.94 kg/m   Specialty Comments:  No specialty comments available.  PMFS History: Patient Active Problem List   Diagnosis Date Noted  . Sebaceous cyst   . Amputated below knee, right ([place]) 10/19/2017  . History of transmetatarsal amputation of left foot ([place]) 06/12/2017  . Special screening for malignant neoplasms, colon   . Midfoot ulcer, left, limited to breakdown of skin ([place]) 11/18/2016  . ARF (acute renal failure) ([place]) 02/08/2016  . Chronic combined systolic and diastolic CHF (congestive heart failure) ([place]) 02/08/2016  . Splenic infarct 01/06/2014  . Acute systolic CHF (congestive heart failure) ([place]) 08/03/2013  . Hx of right BKA ([place]) 07/26/2013  . Anemia 07/15/2013  . Hyperkalemia 07/15/2013  . Hyposmolality and/or hyponatremia 07/15/2013  . Acute respiratory failure ([place]) 07/15/2013  . Mitral regurgitation 07/15/2013  .  Oliguria 07/15/2013  . Bacteremia due to Staphylococcus aureus 07/15/2013  . Hyperchloremia 07/15/2013  . Acute kidney failure ([place]) 07/14/2013  . Hypotension, unspecified 07/14/2013  . Acute systolic heart failure ([place]) 07/12/2013  . Acute pulmonary edema ([place]) 07/11/2013  . Pneumonia, organism unspecified(486) 07/11/2013  . Hypoxemia 07/11/2013  . Cardiomyopathy, ischemic 07/03/2013  . Coronary atherosclerosis of native coronary artery 07/01/2013  . NSTEMI (non-ST elevated myocardial infarction) ([place]) 06/26/2013  . AKI (acute kidney injury) ([place]) 06/25/2013  . Shock circulatory ([place]) 06/25/2013  . Dehydration with  hyponatremia 06/25/2013  . Leukocytosis, unspecified 06/25/2013  . Sepsis(995.91) 06/25/2013  . Aftercare following surgery of the circulatory system, [place] 02/11/2013  . Peripheral vascular disease, unspecified ([place]) 12/31/2012  . Diabetes mellitus out of control ([place]) 12/17/2012  . Essential hypertension, benign 12/17/2012  . Atherosclerotic peripheral vascular disease ([place]) 12/17/2012  . Hypercholesteremia    Past Medical History:  Diagnosis Date  . Allergic rhinitis   . Anemia    takes supplement  . Chronic cough   . Chronic sinusitis   . Complication of anesthesia    couldn't swallow and talk  . Coronary artery disease   . CVA (cerebral infarction) 2011   R hand deficit  . Diabetes mellitus without complication ([place]) ~8588   last HbA1c ~9 .... fasting 160s  . GERD (gastroesophageal reflux disease)   . Heart murmur   . Hypercholesteremia   . Hypertension   . Myocardial infarction ([place]) 2014  . Osteomyelitis ([place])    first and third metatarsal  . Pneumonia    hx of walking  . Splenic infarct    setting of lupus anticoagulant  . Stroke [facility]) 2011   right arm weakness    Family History  Problem Relation Age of Onset  . Pancreatic cancer Mother   . Diabetes Mother   . Hyperlipidemia Mother   . Diabetes Unknown   . Breast cancer Sister   . Depression Maternal Grandmother   . Heart disease Maternal Grandmother   . Depression Maternal Grandfather   . Heart disease Maternal Grandfather   . Depression Paternal Grandmother   . Heart disease Paternal Grandmother   . Depression Paternal Grandfather   . Heart disease Paternal Grandfather     Past Surgical History:  Procedure Laterality Date  . 4th Toe Amputation Left Jan. 2014   4th toe in [place]  . AMPUTATION Left 12/08/2012   Procedure: AMPUTATION DIGIT;  Surgeon: Mcarthur Rossetti, MD;  Location: [facility];  Service: Orthopedics;  Laterality: Left;  3rd toe amputation possible 5th toe amputation  . AMPUTATION Left  12/11/2012   Procedure: Amputation of 2nd Toe;  Surgeon: Mcarthur Rossetti, MD;  Location: [place];  Service: Orthopedics;  Laterality: Left;  . AMPUTATION Right 06/29/2013   Procedure: AMPUTATION BELOW KNEE;  Surgeon: Newt Minion, MD;  Location: [place];  Service: Orthopedics;  Laterality: Right;  . AMPUTATION Left 08/29/2014   Procedure: Left Great Toe Amputation at MTP;  Surgeon: Newt Minion, MD;  Location: [place];  Service: Orthopedics;  Laterality: Left;  . AMPUTATION Left 10/31/2014   Procedure: Midfoot Amputation;  Surgeon: Newt Minion, MD;  Location: [place];  Service: Orthopedics;  Laterality: Left;  . BYPASS GRAFT POPLITEAL TO TIBIAL Left 12/13/2012   Procedure: BYPASS GRAFT POPLITEAL TO TIBIAL;  Surgeon: Serafina Mitchell, MD;  [facility]: [facility];  Service: Vascular;  Laterality: Left;  Left Popliteal to Posterior Tibial Bypass Graft with reversed saphenous vein graft  .  CARDIAC CATHETERIZATION    . COLONOSCOPY [name] 04/14/2017   Procedure: COLONOSCOPY;  Surgeon: Danie Binder, MD;  [facility];  Service: Endoscopy;  Laterality: [name];  1000  . I&D EXTREMITY Left 12/08/2012   Procedure: IRRIGATION AND DEBRIDEMENT EXTREMITY;  Surgeon: Mcarthur Rossetti, MD;  Location: [facility];  Service: Orthopedics;  Laterality: Left;  . I&D EXTREMITY Left 12/11/2012   Procedure: REPEAT I&D LEFT FOOT;  Surgeon: Mcarthur Rossetti, MD;  Location: [place];  Service: Orthopedics;  Laterality: Left;  . INCISION AND DRAINAGE Right 06/25/2013   Procedure: INCISION AND DRAINAGE RIGHT FOOT;  Surgeon: Mcarthur Rossetti, MD;  Location: [facility];  Service: Orthopedics;  Laterality: Right;  . LEFT HEART CATHETERIZATION WITH CORONARY ANGIOGRAM [name] 06/28/2013   Procedure: LEFT HEART CATHETERIZATION WITH CORONARY ANGIOGRAM;  Surgeon: Burnell Blanks, MD;  Location: [facility] CATH [facility];  Service: Cardiovascular;  Laterality: [name];  . MASS EXCISION [name] 11/15/2017   Procedure: EXCISION 4CM CYST ON NECK;  Surgeon:  Virl Cagey, MD;  Location: [facility];  Service: General;  Laterality: [name];  . PENILE PROSTHESIS IMPLANT    . POLYPECTOMY  04/14/2017   Procedure: POLYPECTOMY;  Surgeon: Danie Binder, MD;  [facility];  Service: Endoscopy;;  colon  . STUMP REVISION Right 08/16/2013   Procedure: STUMP REVISION- right Below Knee Amputation;  Surgeon: Newt Minion, MD;  Location: [place];  Service: Orthopedics;  Laterality: Right;  . TEE WITHOUT CARDIOVERSION [name] 07/03/2013   Procedure: TRANSESOPHAGEAL ECHOCARDIOGRAM (TEE);  Surgeon: Larey Dresser, MD;  Location: [place];  Service: Cardiovascular;  Laterality: [name];  . TONSILLECTOMY AND ADENOIDECTOMY     Social History   Occupational History  . 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 Smoking status: Never Smoker  . Smokeless tobacco: Never Used  Substance and Sexual Activity  . Alcohol use: No    Alcohol/week: 0.0 standard drinks  . Drug use: No  . Sexual activity: Never

## 2018-06-05 ENCOUNTER — Ambulatory Visit: Payer: Medicare Other | Admitting: Urology

## 2018-06-05 DIAGNOSIS — N481 Balanitis: Secondary | ICD-10-CM | POA: Diagnosis not present

## 2018-06-05 DIAGNOSIS — N5201 Erectile dysfunction due to arterial insufficiency: Secondary | ICD-10-CM | POA: Diagnosis not present

## 2018-07-23 ENCOUNTER — Other Ambulatory Visit: Payer: Self-pay | Admitting: Cardiology

## 2018-08-03 DIAGNOSIS — E1165 Type 2 diabetes mellitus with hyperglycemia: Secondary | ICD-10-CM | POA: Diagnosis not present

## 2018-08-03 DIAGNOSIS — E782 Mixed hyperlipidemia: Secondary | ICD-10-CM | POA: Diagnosis not present

## 2018-08-03 DIAGNOSIS — I10 Essential (primary) hypertension: Secondary | ICD-10-CM | POA: Diagnosis not present

## 2018-08-03 DIAGNOSIS — D509 Iron deficiency anemia, unspecified: Secondary | ICD-10-CM | POA: Diagnosis not present

## 2018-08-10 DIAGNOSIS — K219 Gastro-esophageal reflux disease without esophagitis: Secondary | ICD-10-CM | POA: Diagnosis not present

## 2018-08-10 DIAGNOSIS — E1165 Type 2 diabetes mellitus with hyperglycemia: Secondary | ICD-10-CM | POA: Diagnosis not present

## 2018-08-10 DIAGNOSIS — I10 Essential (primary) hypertension: Secondary | ICD-10-CM | POA: Diagnosis not present

## 2018-08-10 DIAGNOSIS — I5032 Chronic diastolic (congestive) heart failure: Secondary | ICD-10-CM | POA: Diagnosis not present

## 2018-08-10 DIAGNOSIS — E782 Mixed hyperlipidemia: Secondary | ICD-10-CM | POA: Diagnosis not present

## 2018-08-10 DIAGNOSIS — Z0000 Encounter for general adult medical examination without abnormal findings: Secondary | ICD-10-CM | POA: Diagnosis not present

## 2018-08-10 DIAGNOSIS — R944 Abnormal results of kidney function studies: Secondary | ICD-10-CM | POA: Diagnosis not present

## 2018-08-10 DIAGNOSIS — I2510 Atherosclerotic heart disease of native coronary artery without angina pectoris: Secondary | ICD-10-CM | POA: Diagnosis not present

## 2018-08-13 ENCOUNTER — Ambulatory Visit (INDEPENDENT_AMBULATORY_CARE_PROVIDER_SITE_OTHER): Payer: Medicare Other | Admitting: Orthopedic Surgery

## 2018-08-15 ENCOUNTER — Telehealth: Payer: Self-pay | Admitting: *Deleted

## 2018-08-15 NOTE — Telephone Encounter (Signed)
Home health monitoring nurse called to report pt had gained around 6lbs over the last 3 days - spoke with pt who says weight this morning is 260lbs - denies any SOB/swelling or any symptoms says he feels great - has upcoming appt with Dr Harl Bowie 11/4

## 2018-08-15 NOTE — Telephone Encounter (Signed)
Verify taking 80mg  daily of lasix, if so take additional 40mg  in the evening for 2 days to help bring down weight. Keep current appt   J Marchele Decock MD

## 2018-08-17 ENCOUNTER — Encounter: Payer: Self-pay | Admitting: *Deleted

## 2018-08-20 ENCOUNTER — Encounter: Payer: Self-pay | Admitting: Cardiology

## 2018-08-20 ENCOUNTER — Ambulatory Visit (INDEPENDENT_AMBULATORY_CARE_PROVIDER_SITE_OTHER): Payer: Medicare Other | Admitting: Cardiology

## 2018-08-20 VITALS — BP 123/83 | HR 63 | Ht 71.0 in | Wt 265.4 lb

## 2018-08-20 DIAGNOSIS — I10 Essential (primary) hypertension: Secondary | ICD-10-CM

## 2018-08-20 DIAGNOSIS — E782 Mixed hyperlipidemia: Secondary | ICD-10-CM

## 2018-08-20 DIAGNOSIS — I5022 Chronic systolic (congestive) heart failure: Secondary | ICD-10-CM

## 2018-08-20 DIAGNOSIS — I2510 Atherosclerotic heart disease of native coronary artery without angina pectoris: Secondary | ICD-10-CM | POA: Diagnosis not present

## 2018-08-20 NOTE — Patient Instructions (Signed)

## 2018-08-20 NOTE — Progress Notes (Signed)
Clinical Summary [name] is a 57 y.o.male seen today for follow up of the following medical problems.   1. Chronic combined systolic/diastolic heart failure - ICM, LVEF 40-45% by echo 12/2013, grade II diastolic dysfunction - 06/7672 LVEF 41%, grade I diastolic dysfunction     - reported recent weight gain at home. Up 6 lbs over 3 days. Was to take additional lasix 40mg  in the evening x 2 days. Home scale back to 262 lbs.  - no recent chest pain. No SOB or DOE.    2. CAD - hx of demand ischemia 06/2013 in setting of sepsis with necrotic foot wound. - cath at that time 06/2013 LM patent, LAD 40% prox, 70% mid follow by 60% lesion, distal 90%. Diag 99%. LCX 80-90% prox, OM1 80%, OM2 99%, OM3 99 % (all OMs are small), RCA small non-dom mid 99%. LVEF 40% by LVgram.  - CAD was managed medically after discussions with CT surgery at that time, with plans to reevaluate once recurring infection issues resolved.   - most recently has not been interested in reconsidering surgery - no recent chest pain or SOB.   4. PAD - followed by vascular and [facility] - previous right BKA, previous multiple toe amputations. 08/30/14 treated for osteo and ulcer of left great toe with amputation. Jan 2016 had left midfoot amputation.  - Followed by ortho Dr Sharol Given.     5. Hyperlipidemia 07/2017 TC 88 TG 85 LDL 43 HDL 28  - compliant with statin  6. HTN - compliant with meds  7. Carotid stenosis - 03/2015 mild bilateral disease    SH: he is getting married this  weekend. Honeymoon planned in [place]    Past Medical History:  Diagnosis Date  . Allergic rhinitis   . Anemia    takes supplement  . Chronic cough   . Chronic sinusitis   . Complication of anesthesia    couldn't swallow and talk  . Coronary artery disease   . CVA (cerebral infarction) 2011   R hand deficit  . Diabetes mellitus without complication ([place]) ~9379   last HbA1c ~9 .... fasting 160s  . GERD  (gastroesophageal reflux disease)   . Heart murmur   . Hypercholesteremia   . Hypertension   . Myocardial infarction ([place]) 2014  . Osteomyelitis ([place])    first and third metatarsal  . Pneumonia    hx of walking  . Splenic infarct    setting of lupus anticoagulant  . Stroke [facility]) 2011   right arm weakness     Allergies  Allergen Reactions  . Amoxicillin Nausea And Vomiting    Has patient had a PCN reaction causing immediate rash, facial/tongue/throat swelling, SOB or lightheadedness with hypotension: No Has patient had a PCN reaction causing severe rash involving mucus membranes or skin necrosis: No Has patient had a PCN reaction that required hospitalization: No Has patient had a PCN reaction occurring within the last 10 years: Yes If all of the above answers are "NO", then may proceed with Cephalosporin use.      Current Outpatient Medications  Medication Sig Dispense Refill  . aspirin EC 81 MG tablet Take 1 tablet (81 mg total) by mouth daily. 30 tablet 1  . atorvastatin (LIPITOR) 80 MG tablet Take 80 mg by mouth daily.    . carvedilol (COREG) 25 MG tablet Take 25 mg by mouth 2 (two) times daily.     . furosemide (LASIX) 80 MG tablet Take  80 mg by mouth daily. May take additional 40 mg as needed for weight gain    . HUMALOG KWIKPEN 100 UNIT/ML KiwkPen Inject 24 Units into the skin 3 (three) times daily before meals.     . hydrALAZINE (APRESOLINE) 10 MG tablet Take 1 tablet (10 mg total) by mouth 3 (three) times daily. 120 tablet 1  . LEVEMIR FLEXTOUCH 100 UNIT/ML Pen Inject 54 Units into the skin at bedtime.   5  . losartan (COZAAR) 50 MG tablet TAKE 1 TABLET BY MOUTH  DAILY 90 tablet 0  . mupirocin ointment (BACTROBAN) 2 % Apply 1 application topically daily. (Patient taking differently: Apply 1 application topically once a week. Apply to left foot) 22 g 6  . naproxen sodium (ANAPROX) 220 MG tablet Take 220 mg by mouth daily as needed (pain).    . nitroGLYCERIN (NITRODUR -  DOSED IN MG/24 HR) 0.2 mg/hr patch Place 0.2 mg onto the skin once a week. Applies to foot/ankle area to increase blood flow    . oxyCODONE (ROXICODONE) 5 MG immediate release tablet Take 1 tablet (5 mg total) by mouth every 4 (four) hours as needed for severe pain or breakthrough pain. 20 tablet 0  . pantoprazole (PROTONIX) 40 MG tablet Take 1 tablet (40 mg total) by mouth daily. 30 tablet 1  . polyethylene glycol (MIRALAX / GLYCOLAX) packet Take 17 g by mouth daily as needed for mild constipation.     . potassium chloride (K-DUR,KLOR-CON) 10 MEQ tablet Take 1 tablet (10 mEq total) by mouth daily. 30 tablet 6  . senna-docusate (SENOKOT-S) 8.6-50 MG per tablet Take 1 tablet by mouth daily as needed for mild constipation.      No current facility-administered medications for this visit.      Past Surgical History:  Procedure Laterality Date  . 4th Toe Amputation Left Jan. 2014   4th toe in [place]  . AMPUTATION Left 12/08/2012   Procedure: AMPUTATION DIGIT;  Surgeon: Mcarthur Rossetti, MD;  Location: [place];  Service: Orthopedics;  Laterality: Left;  3rd toe amputation possible 5th toe amputation  . AMPUTATION Left 12/11/2012   Procedure: Amputation of 2nd Toe;  Surgeon: Mcarthur Rossetti, MD;  Location: [place];  Service: Orthopedics;  Laterality: Left;  . AMPUTATION Right 06/29/2013   Procedure: AMPUTATION BELOW KNEE;  Surgeon: Newt Minion, MD;  Location: [place];  Service: Orthopedics;  Laterality: Right;  . AMPUTATION Left 08/29/2014   Procedure: Left Great Toe Amputation at MTP;  Surgeon: Newt Minion, MD;  Location: [place];  Service: Orthopedics;  Laterality: Left;  . AMPUTATION Left 10/31/2014   Procedure: Midfoot Amputation;  Surgeon: Newt Minion, MD;  Location: [place];  Service: Orthopedics;  Laterality: Left;  . BYPASS GRAFT POPLITEAL TO TIBIAL Left 12/13/2012   Procedure: BYPASS GRAFT POPLITEAL TO TIBIAL;  Surgeon: Serafina Mitchell, MD;  Location: [facility];  Service: Vascular;   Laterality: Left;  Left Popliteal to Posterior Tibial Bypass Graft with reversed saphenous vein graft  . CARDIAC CATHETERIZATION    . COLONOSCOPY [name] 04/14/2017   Procedure: COLONOSCOPY;  Surgeon: Danie Binder, MD;  [facility]: [facility];  Service: Endoscopy;  Laterality: [name];  1000  . I&D EXTREMITY Left 12/08/2012   Procedure: IRRIGATION AND DEBRIDEMENT EXTREMITY;  Surgeon: Mcarthur Rossetti, MD;  Location: [place];  Service: Orthopedics;  Laterality: Left;  . I&D EXTREMITY Left 12/11/2012   Procedure: REPEAT I&D LEFT FOOT;  Surgeon: Mcarthur Rossetti, MD;  Location: [place];  Service: Orthopedics;  Laterality: Left;  . INCISION AND DRAINAGE Right 06/25/2013   Procedure: INCISION AND DRAINAGE RIGHT FOOT;  Surgeon: Mcarthur Rossetti, MD;  Location: [facility];  Service: Orthopedics;  Laterality: Right;  . LEFT HEART CATHETERIZATION WITH CORONARY ANGIOGRAM [name] 06/28/2013   Procedure: LEFT HEART CATHETERIZATION WITH CORONARY ANGIOGRAM;  Surgeon: Burnell Blanks, MD;  Location[facility];  Service: Cardiovascular;  Laterality: [name];  . MASS EXCISION [name] 11/15/2017   Procedure: EXCISION 4CM CYST ON NECK;  Surgeon: Virl Cagey, MD;  Location: [facility];  Service: General;  Laterality: [name];  . PENILE PROSTHESIS IMPLANT    . POLYPECTOMY  04/14/2017   Procedure: POLYPECTOMY;  Surgeon: Danie Binder, MD;  [facility];  Service: Endoscopy;;  colon  . STUMP REVISION Right 08/16/2013   Procedure: STUMP REVISION- right Below Knee Amputation;  Surgeon: Newt Minion, MD;  Location: [place];  Service: Orthopedics;  Laterality: Right;  . TEE WITHOUT CARDIOVERSION [name] 07/03/2013   Procedure: TRANSESOPHAGEAL ECHOCARDIOGRAM (TEE);  Surgeon: Larey Dresser, MD;  Location: [place];  Service: Cardiovascular;  Laterality: [name];  . TONSILLECTOMY AND ADENOIDECTOMY       Allergies  Allergen Reactions  . Amoxicillin Nausea And Vomiting    Has patient had a PCN reaction causing  immediate rash, facial/tongue/throat swelling, SOB or lightheadedness with hypotension: No Has patient had a PCN reaction causing severe rash involving mucus membranes or skin necrosis: No Has patient had a PCN reaction that required hospitalization: No Has patient had a PCN reaction occurring within the last 10 years: Yes If all of the above answers are "NO", then may proceed with Cephalosporin use.       Family History  Problem Relation Age of Onset  . Pancreatic cancer Mother   . Diabetes Mother   . Hyperlipidemia Mother   . Diabetes Unknown   . Breast cancer Sister   . Depression Maternal Grandmother   . Heart disease Maternal Grandmother   . Depression Maternal Grandfather   . Heart disease Maternal Grandfather   . Depression Paternal Grandmother   . Heart disease Paternal Grandmother   . Depression Paternal Grandfather   . Heart disease Paternal Grandfather      Social History Mr. Madero reports that he has never smoked. He has never used smokeless tobacco. Mr. Bady reports that he does not drink alcohol.   Review of Systems CONSTITUTIONAL: No weight loss, fever, chills, weakness or fatigue.  HEENT: Eyes: No visual loss, blurred vision, double vision or yellow sclerae.No hearing loss, sneezing, congestion, runny nose or sore throat.  SKIN: No rash or itching.  CARDIOVASCULAR: per hpi RESPIRATORY: No shortness of breath, cough or sputum.  GASTROINTESTINAL: No anorexia, nausea, vomiting or diarrhea. No abdominal pain or blood.  GENITOURINARY: No burning on urination, no polyuria NEUROLOGICAL: No headache, dizziness, syncope, paralysis, ataxia, numbness or tingling in the extremities. No change in bowel or bladder control.  MUSCULOSKELETAL: No muscle, back pain, joint pain or stiffness.  LYMPHATICS: No enlarged nodes. No history of splenectomy.  PSYCHIATRIC: No history of depression or anxiety.  ENDOCRINOLOGIC: No reports of sweating, cold or heat intolerance. No  polyuria or polydipsia.  Marland Kitchen   Physical Examination Vitals:   08/20/18 1011  BP: 123/83  Pulse: 63   Vitals:   08/20/18 1011  Weight: 265 lb 6.4 oz (120.4 kg)  Height: 5\' 11"  (1.803 m)    Gen: resting comfortably, no acute distress HEENT:  no scleral icterus, pupils equal round and reactive, no palptable cervical adenopathy,  CV: RRR, no m/r/g, no jvd. Bilateral carotid bruits Resp: Clear to auscultation bilaterally GI: abdomen is soft, non-tender, non-distended, normal bowel sounds, no hepatosplenomegaly MSK: extremities are warm, no edema.  Skin: warm, no rash Neuro:  no focal deficits Psych: appropriate affect   Diagnostic Studies 06/2013 Cath Hemodynamic Findings: Central aortic pressure: 90/61 Left ventricular pressure: 90/19/29  Angiographic Findings:  Left main: Short segment without obstruction.   Left Anterior Descending Artery: Moderate caliber diffusely diseased vessel that courses to the apex. The proximal vessel has diffuse 40% stenosis. The mid vessel has a 70% stenosis followed by a 60% stenosis. The distal vessel has diffuse 90% stenosis. The diagonal is a moderate caliber vessel with ostial 99% stenosis with 99% disease extending into the mid vessel.   Circumflex Artery: Large caliber vessel with proximal 80-90% stenosis. There are three small to moderate caliber obtuse marginal branches. The first OM has mid 80% stenosis. The second OM has a proximal 99% stenosis. The third OM has diffuse 99% stenosis. The left sided posterolateral Marybel Alcott has 99% stenosis.   Right Coronary Artery: Small non-dominant vessel with mid 99% stenosis.   Left Ventricular Angiogram: LVEF=40%  Impression: 1. Triple vessel CAD 2. NSTEMI secondary to demand ischemia from sepsis with severe underlying CAD 3. Mild LV systolic dysfunction  Recommendations: Complex situation. The only option for coronary revascularization is CABG. He is not a favorable candidate for CABG at  this time with necrotic right foot, sepsis. Even though not ideal, would favor medical management of CAD while undergoing treatment of his necrotic foot. Would proceed with right BKA this weekend. I have discussed the case with CT surgery who agrees that CABG is not a good option at this time. Will ask CT surgery to see after he recovers from his BKA. Continue ASA, beta blocker, statin.    Complications:None. The patient tolerated the procedure well.     11/2012 Carotid US Summary:  - No significant extracranial carotid artery stenosis demonstrated. Vertebrals are patent with antegrade flow. - ICA/CCA ratio= right = 0.94. left = 0.73. Other specific details can be found in the table(s) above.  Prepared and Electronically Authenticated by  03/2015 Carotid US IMPRESSION: Mild atherosclerotic disease in the carotid arteries, right side greater the left. The peak systolic velocity in the proximal right internal carotid artery is slightly elevated. Estimated degree of stenosis in the right internal carotid artery is close to 50%.  Estimated degree of stenosis in the left internal carotid artery is less than 50%.  Patent vertebral arteries.   12/07/15 Clinic EKG (performed and reviewed in clinic): NSR  01/2017 echo  Study Conclusions  - Left ventricle: The cavity size was normal. Wall thickness was at the upper limits of normal. The estimated ejection fraction was 50%. Doppler parameters are consistent with abnormal left ventricular relaxation (grade 1 diastolic dysfunction). - Aortic valve: Mildly calcified annulus. Trileaflet. - Mitral valve: Calcified annulus. Mildly thickened leaflets . There was trivial regurgitation. - Tricuspid valve: There was trivial regurgitation. Peak RV-RA gradient (S): 21 mm Hg. - Pulmonary arteries: Systolic pressure could not be accurately estimated. - Pericardium, extracardiac: A trivial pericardial effusion  was identified.  Impressions:  - Overall limited images. Upper normal LV wall thickness with LVEF approximately 50% based on best views. Cannot exclude periapical wall motion abnormalities. Would suggest a limited study with Definity contrast. Calcified mitral annulus with mildly thickened leaflets and trivial mitral  regurgitation. Trivial tricuspid regurgitation with RV-RA gradient 21 mmHg. Trivial pericardial effusion.    Assessment and Plan  1. Chronic systolic heart failure - LVEF has normalized based on most recent echo - occasional weight gain responds to additional diuretic dosign, continue current regimen.   2. CAD - triple vessel CAD managed medically. Seen by CT surgery, due to ongoing recurrent LE leg infections and multiple surgeries, as well as overall high risk recs were for medical therapy, reconsideration if other comorbidities significanlty improved - no recent symptoms. He is not in favor of considering CABG at this time. Given he has done well and has no symptoms reasonable to continue medical therapy at this time.   -we will continue current medical therapy at this time   3. Hyperlipidemia -at goal, continue statin   4. HTN - bp at goal, continue current meds      Arnoldo Lenis, M.D.

## 2018-08-20 NOTE — Telephone Encounter (Signed)
Pt was seen by Dr Harl Bowie today 11/4

## 2018-09-03 ENCOUNTER — Ambulatory Visit (INDEPENDENT_AMBULATORY_CARE_PROVIDER_SITE_OTHER): Payer: Medicare Other | Admitting: Physician Assistant

## 2018-09-03 ENCOUNTER — Encounter (INDEPENDENT_AMBULATORY_CARE_PROVIDER_SITE_OTHER): Payer: Self-pay | Admitting: Orthopedic Surgery

## 2018-09-03 VITALS — Ht 71.0 in | Wt 265.4 lb

## 2018-09-03 DIAGNOSIS — Z794 Long term (current) use of insulin: Secondary | ICD-10-CM

## 2018-09-03 DIAGNOSIS — E1142 Type 2 diabetes mellitus with diabetic polyneuropathy: Secondary | ICD-10-CM | POA: Diagnosis not present

## 2018-09-03 DIAGNOSIS — Z89432 Acquired absence of left foot: Secondary | ICD-10-CM

## 2018-09-03 DIAGNOSIS — L97421 Non-pressure chronic ulcer of left heel and midfoot limited to breakdown of skin: Secondary | ICD-10-CM

## 2018-09-03 NOTE — Progress Notes (Signed)
Office Visit Note   Patient: Albert [name]           Date of Birth: 1960-12-17           MRN: [id number] Visit Date: 09/03/2018              Requested by: Celene Squibb, MD [place], [place] [address] PCP: Celene Squibb, MD  Chief Complaint  Patient presents with  . Left Foot - Follow-up    Left foot TMA amp f/u appt; callus       HPI: The patient is a 57 year old male who is here today with his wife for follow-up of his midfoot ulcers over the plantar surface.  He previously undergone a transmetatarsal amputation of the left foot.  He reports continued callus formation over the midfoot over the distal residual limb.  Assessment & Plan: Visit Diagnoses:  1. Midfoot ulcer, left, limited to breakdown of skin ([place])   2. History of transmetatarsal amputation of left foot ([place])   3. Type 2 diabetes mellitus with diabetic polyneuropathy, with long-term current use of insulin (HCC)     Plan: The callus of the midfoot was debrided sharply with a #10 blade knife and the patient tolerated this well.  We did discuss Achilles stretching to try to keep the pressure off his residual limb.  He is working with [employer] on perhaps getting some new orthotics but reports that he is going to wait till January to do this as he has a large out-of-pocket expense for this.  Follow-Up Instructions: Return in about 3 months (around 12/04/2018).   Ortho Exam  Patient is alert, oriented, no adenopathy, well-dressed, normal affect, normal respiratory effort. The left foot transmetatarsal amputation is well-healed.  He does have some callus over the distal residual limb as well as over the midfoot area over the second and fourth metatarsal area.  After informed consent the callus was debrided with a #10 blade knife and the patient tolerated this well.  There are no signs of cellulitis or infection currently.  He has good pedal pulses.  He does have a somewhat tight Achilles and was instructed in  Achilles stretches.  He is wearing a nitroglycerin patch to the foot.  Imaging: No results found. No images are attached to the encounter.  Labs: Lab Results  Component Value Date   HGBA1C 7.7 (H) 11/10/2017   HGBA1C 9.1 (H) 02/08/2016   HGBA1C 8.2 (H) 05/16/2015   REPTSTATUS 02/13/2016 FINAL 02/08/2016   CULT NO GROWTH 5 DAYS 02/08/2016   LABORGA STAPHYLOCOCCUS AUREUS 06/26/2013     Lab Results  Component Value Date   ALBUMIN 3.1 (L) 05/17/2015   ALBUMIN 4.0 10/31/2014   ALBUMIN 4.1 08/28/2014    Body mass index is 37.02 kg/m.  Orders:  No orders of the defined types were placed in this encounter.  No orders of the defined types were placed in this encounter.    Procedures: No procedures performed  Clinical Data: No additional findings.  ROS:  All other systems negative, except as noted in the HPI. Review of Systems  Objective: Vital Signs: Ht 5\' 11"  (1.803 m)   Wt 265 lb 6.4 oz (120.4 kg)   BMI 37.02 kg/m   Specialty Comments:  No specialty comments available.  PMFS History: Patient Active Problem List   Diagnosis Date Noted  . Sebaceous cyst   . Amputated below knee, right ([place]) 10/19/2017  . History of transmetatarsal amputation of left  foot ([place]) 06/12/2017  . Special screening for malignant neoplasms, colon   . Midfoot ulcer, left, limited to breakdown of skin ([place]) 11/18/2016  . ARF (acute renal failure) ([place]) 02/08/2016  . Chronic combined systolic and diastolic CHF (congestive heart failure) ([place]) 02/08/2016  . Splenic infarct 01/06/2014  . Acute systolic CHF (congestive heart failure) ([place]) 08/03/2013  . Hx of right BKA ([place]) 07/26/2013  . Anemia 07/15/2013  . Hyperkalemia 07/15/2013  . Hyposmolality and/or hyponatremia 07/15/2013  . Acute respiratory failure ([place]) 07/15/2013  . Mitral regurgitation 07/15/2013  . Oliguria 07/15/2013  . Bacteremia due to Staphylococcus aureus 07/15/2013  . Hyperchloremia 07/15/2013  . Acute kidney  failure ([place]) 07/14/2013  . Hypotension, unspecified 07/14/2013  . Acute systolic heart failure ([place]) 07/12/2013  . Acute pulmonary edema ([place]) 07/11/2013  . Pneumonia, organism unspecified(486) 07/11/2013  . Hypoxemia 07/11/2013  . Cardiomyopathy, ischemic 07/03/2013  . Coronary atherosclerosis of native coronary artery 07/01/2013  . NSTEMI (non-ST elevated myocardial infarction) ([place]) 06/26/2013  . AKI (acute kidney injury) ([place]) 06/25/2013  . Shock circulatory ([place]) 06/25/2013  . Dehydration with hyponatremia 06/25/2013  . Leukocytosis, unspecified 06/25/2013  . Sepsis(995.91) 06/25/2013  . Aftercare following surgery of the circulatory system, [place] 02/11/2013  . Peripheral vascular disease, unspecified ([place]) 12/31/2012  . Diabetes mellitus out of control ([place]) 12/17/2012  . Essential hypertension, benign 12/17/2012  . Atherosclerotic peripheral vascular disease ([place]) 12/17/2012  . Hypercholesteremia    Past Medical History:  Diagnosis Date  . Allergic rhinitis   . Anemia    takes supplement  . Chronic cough   . Chronic sinusitis   . Complication of anesthesia    couldn't swallow and talk  . Coronary artery disease   . CVA (cerebral infarction) 2011   R hand deficit  . Diabetes mellitus without complication ([place]) ~3810   last HbA1c ~9 .... fasting 160s  . GERD (gastroesophageal reflux disease)   . Heart murmur   . Hypercholesteremia   . Hypertension   . Myocardial infarction ([place]) 2014  . Osteomyelitis ([place])    first and third metatarsal  . Pneumonia    hx of walking  . Splenic infarct    setting of lupus anticoagulant  . Stroke [facility]) 2011   right arm weakness    Family History  Problem Relation Age of Onset  . Pancreatic cancer Mother   . Diabetes Mother   . Hyperlipidemia Mother   . Diabetes Unknown   . Breast cancer Sister   . Depression Maternal Grandmother   . Heart disease Maternal Grandmother   . Depression Maternal Grandfather   . Heart disease  Maternal Grandfather   . Depression Paternal Grandmother   . Heart disease Paternal Grandmother   . Depression Paternal Grandfather   . Heart disease Paternal Grandfather     Past Surgical History:  Procedure Laterality Date  . 4th Toe Amputation Left Jan. 2014   4th toe in [place]  . AMPUTATION Left 12/08/2012   Procedure: AMPUTATION DIGIT;  Surgeon: Mcarthur Rossetti, MD;  Location: [facility];  Service: Orthopedics;  Laterality: Left;  3rd toe amputation possible 5th toe amputation  . AMPUTATION Left 12/11/2012   Procedure: Amputation of 2nd Toe;  Surgeon: Mcarthur Rossetti, MD;  Location: [place];  Service: Orthopedics;  Laterality: Left;  . AMPUTATION Right 06/29/2013   Procedure: AMPUTATION BELOW KNEE;  Surgeon: Newt Minion, MD;  Location: [place];  Service: Orthopedics;  Laterality: Right;  . AMPUTATION Left 08/29/2014  Procedure: Left Great Toe Amputation at MTP;  Surgeon: Newt Minion, MD;  Location: [place];  Service: Orthopedics;  Laterality: Left;  . AMPUTATION Left 10/31/2014   Procedure: Midfoot Amputation;  Surgeon: Newt Minion, MD;  Location: [place];  Service: Orthopedics;  Laterality: Left;  . BYPASS GRAFT POPLITEAL TO TIBIAL Left 12/13/2012   Procedure: BYPASS GRAFT POPLITEAL TO TIBIAL;  Surgeon: Serafina Mitchell, MD;  [facility];  Service: Vascular;  Laterality: Left;  Left Popliteal to Posterior Tibial Bypass Graft with reversed saphenous vein graft  . CARDIAC CATHETERIZATION    . COLONOSCOPY [name] 04/14/2017   Procedure: COLONOSCOPY;  Surgeon: Danie Binder, MD;  [facility];  Service: Endoscopy;  Laterality: [name];  1000  . I&D EXTREMITY Left 12/08/2012   Procedure: IRRIGATION AND DEBRIDEMENT EXTREMITY;  Surgeon: Mcarthur Rossetti, MD;  Location: [place];  Service: Orthopedics;  Laterality: Left;  . I&D EXTREMITY Left 12/11/2012   Procedure: REPEAT I&D LEFT FOOT;  Surgeon: Mcarthur Rossetti, MD;  Location: [place];  Service: Orthopedics;  Laterality:  Left;  . INCISION AND DRAINAGE Right 06/25/2013   Procedure: INCISION AND DRAINAGE RIGHT FOOT;  Surgeon: Mcarthur Rossetti, MD;  [facility];  Service: Orthopedics;  Laterality: Right;  . LEFT HEART CATHETERIZATION WITH CORONARY ANGIOGRAM [name] 06/28/2013   Procedure: LEFT HEART CATHETERIZATION WITH CORONARY ANGIOGRAM;  Surgeon: Burnell Blanks, MD;  Location: [facility] LAB;  Service: Cardiovascular;  Laterality: [name];  . MASS EXCISION [name] 11/15/2017   Procedure: EXCISION 4CM CYST ON NECK;  Surgeon: Virl Cagey, MD;  Location[facility];  Service: General;  Laterality: [name];  . PENILE PROSTHESIS IMPLANT    . POLYPECTOMY  04/14/2017   Procedure: POLYPECTOMY;  Surgeon: Danie Binder, MD;  [facility];  Service: Endoscopy;;  colon  . STUMP REVISION Right 08/16/2013   Procedure: STUMP REVISION- right Below Knee Amputation;  Surgeon: Newt Minion, MD;  Location: [place];  Service: Orthopedics;  Laterality: Right;  . TEE WITHOUT CARDIOVERSION [name] 07/03/2013   Procedure: TRANSESOPHAGEAL ECHOCARDIOGRAM (TEE);  Surgeon: Larey Dresser, MD;  Location: [place];  Service: Cardiovascular;  Laterality: [name];  . TONSILLECTOMY AND ADENOIDECTOMY     Social History   Occupational History  . 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 Smoking status: Never Smoker  . Smokeless tobacco: Never Used  Substance and Sexual Activity  . Alcohol use: No    Alcohol/week: 0.0 standard drinks  . Drug use: No  . Sexual activity: Never

## 2018-09-14 ENCOUNTER — Emergency Department (HOSPITAL_COMMUNITY)
Admission: EM | Admit: 2018-09-14 | Discharge: 2018-09-14 | Disposition: A | Discharge status: Home / Self Care | Payer: Medicare Other | Attending: Emergency Medicine | Admitting: Emergency Medicine

## 2018-09-14 ENCOUNTER — Encounter (HOSPITAL_COMMUNITY): Payer: Self-pay | Admitting: Emergency Medicine

## 2018-09-14 ENCOUNTER — Emergency Department (HOSPITAL_COMMUNITY): Payer: Medicare Other

## 2018-09-14 ENCOUNTER — Other Ambulatory Visit: Payer: Self-pay

## 2018-09-14 DIAGNOSIS — Z794 Long term (current) use of insulin: Secondary | ICD-10-CM | POA: Insufficient documentation

## 2018-09-14 DIAGNOSIS — I10 Essential (primary) hypertension: Secondary | ICD-10-CM | POA: Diagnosis not present

## 2018-09-14 DIAGNOSIS — J209 Acute bronchitis, unspecified: Secondary | ICD-10-CM | POA: Diagnosis not present

## 2018-09-14 DIAGNOSIS — J069 Acute upper respiratory infection, unspecified: Secondary | ICD-10-CM | POA: Diagnosis not present

## 2018-09-14 DIAGNOSIS — Z79899 Other long term (current) drug therapy: Secondary | ICD-10-CM | POA: Insufficient documentation

## 2018-09-14 DIAGNOSIS — Z7982 Long term (current) use of aspirin: Secondary | ICD-10-CM | POA: Diagnosis not present

## 2018-09-14 DIAGNOSIS — R05 Cough: Secondary | ICD-10-CM | POA: Diagnosis not present

## 2018-09-14 DIAGNOSIS — J40 Bronchitis, not specified as acute or chronic: Secondary | ICD-10-CM | POA: Diagnosis not present

## 2018-09-14 DIAGNOSIS — J019 Acute sinusitis, unspecified: Secondary | ICD-10-CM

## 2018-09-14 DIAGNOSIS — E119 Type 2 diabetes mellitus without complications: Secondary | ICD-10-CM | POA: Diagnosis not present

## 2018-09-14 MED ORDER — AZITHROMYCIN 250 MG PO TABS
500.0000 mg | ORAL_TABLET | Freq: Once | ORAL | Status: AC
  Administered 2018-09-14: 500 mg via ORAL
  Filled 2018-09-14: qty 2

## 2018-09-14 MED ORDER — ACETAMINOPHEN 500 MG PO TABS
1000.0000 mg | ORAL_TABLET | Freq: Once | ORAL | Status: AC
  Administered 2018-09-14: 1000 mg via ORAL
  Filled 2018-09-14: qty 2

## 2018-09-14 MED ORDER — AZITHROMYCIN 250 MG PO TABS: ORAL_TABLET | ORAL | 0 refills | Status: DC

## 2018-09-14 MED ORDER — CEPHALEXIN 500 MG PO CAPS
500.0000 mg | ORAL_CAPSULE | Freq: Once | ORAL | Status: AC
  Administered 2018-09-14: 500 mg via ORAL
  Filled 2018-09-14: qty 1

## 2018-09-14 MED ORDER — HYDROCOD POLST-CPM POLST ER 10-8 MG/5ML PO SUER
5.0000 mL | Freq: Once | ORAL | Status: AC
Start: 2018-09-14 — End: 2018-09-14
  Administered 2018-09-14: 5 mL via ORAL
  Filled 2018-09-14: qty 5

## 2018-09-14 MED ORDER — HYDROCODONE-HOMATROPINE 5-1.5 MG/5ML PO SYRP: 5.0000 mL | ORAL_SOLUTION | Freq: Four times a day (QID) | ORAL | 0 refills | Status: DC | PRN

## 2018-09-14 MED ORDER — ONDANSETRON HCL 4 MG PO TABS
4.0000 mg | ORAL_TABLET | Freq: Once | ORAL | Status: AC
  Administered 2018-09-14: 4 mg via ORAL
  Filled 2018-09-14: qty 1

## 2018-09-14 MED ORDER — PSEUDOEPHEDRINE HCL 60 MG PO TABS
60.0000 mg | ORAL_TABLET | Freq: Once | ORAL | Status: AC
  Administered 2018-09-14: 60 mg via ORAL
  Filled 2018-09-14: qty 1

## 2018-09-14 MED ORDER — LORATADINE-PSEUDOEPHEDRINE ER 5-120 MG PO TB12: 1.0000 | ORAL_TABLET | Freq: Two times a day (BID) | ORAL | 0 refills | Status: DC

## 2018-09-14 NOTE — ED Provider Notes (Signed)
[facility] EMERGENCY DEPARTMENT Provider Note   CSN: [id number] Arrival date & time: 09/14/18  1649     History   Chief Complaint Chief Complaint  Patient presents with  . Cough    HPI Albert [name] is a 57 y.o. male.  Patient is a 57 year old male who presents to the emergency department with a complaint of cough and congestion.  Patient states this problem started about 3 days ago.  He has had congestion.  Coughing up yellowish colored phlegm.  He has had some body aches as well.  It is of note that the patient is diabetic.  He has not had any recent operations or procedures involving the chest or lung areas.  He denies any hemoptysis.  He states he did notice a few flecks of blood when he blew his nose 1 of the 3 days, but only a single occurrence.  No unusual rash appreciated.  Patient denies of having been out of the country recently.  He has been hesitant to use any over-the-counter medications because of his diabetes status.  The history is provided by the patient.    Past Medical History:  Diagnosis Date  . Allergic rhinitis   . Anemia    takes supplement  . Chronic cough   . Chronic sinusitis   . Complication of anesthesia    couldn't swallow and talk  . Coronary artery disease   . CVA (cerebral infarction) 2011   R hand deficit  . Diabetes mellitus without complication ([place]) ~7619   last HbA1c ~9 .... fasting 160s  . GERD (gastroesophageal reflux disease)   . Heart murmur   . Hypercholesteremia   . Hypertension   . Myocardial infarction ([place]) 2014  . Osteomyelitis ([place])    first and third metatarsal  . Pneumonia    hx of walking  . Splenic infarct    setting of lupus anticoagulant  . Stroke [facility]) 2011   right arm weakness    Patient Active Problem List   Diagnosis Date Noted  . Sebaceous cyst   . Amputated below knee, right ([place]) 10/19/2017  . History of transmetatarsal amputation of left foot ([place]) 06/12/2017  . Special screening for  malignant neoplasms, colon   . Midfoot ulcer, left, limited to breakdown of skin ([place]) 11/18/2016  . ARF (acute renal failure) ([place]) 02/08/2016  . Chronic combined systolic and diastolic CHF (congestive heart failure) ([place]) 02/08/2016  . Splenic infarct 01/06/2014  . Acute systolic CHF (congestive heart failure) ([place]) 08/03/2013  . Hx of right BKA ([place]) 07/26/2013  . Anemia 07/15/2013  . Hyperkalemia 07/15/2013  . Hyposmolality and/or hyponatremia 07/15/2013  . Acute respiratory failure ([place]) 07/15/2013  . Mitral regurgitation 07/15/2013  . Oliguria 07/15/2013  . Bacteremia due to Staphylococcus aureus 07/15/2013  . Hyperchloremia 07/15/2013  . Acute kidney failure ([place]) 07/14/2013  . Hypotension, unspecified 07/14/2013  . Acute systolic heart failure ([place]) 07/12/2013  . Acute pulmonary edema ([place]) 07/11/2013  . Pneumonia, organism unspecified(486) 07/11/2013  . Hypoxemia 07/11/2013  . Cardiomyopathy, ischemic 07/03/2013  . Coronary atherosclerosis of native coronary artery 07/01/2013  . NSTEMI (non-ST elevated myocardial infarction) ([place]) 06/26/2013  . AKI (acute kidney injury) ([place]) 06/25/2013  . Shock circulatory ([place]) 06/25/2013  . Dehydration with hyponatremia 06/25/2013  . Leukocytosis, unspecified 06/25/2013  . Sepsis(995.91) 06/25/2013  . Aftercare following surgery of the circulatory system, [place] 02/11/2013  . Peripheral vascular disease, unspecified ([place]) 12/31/2012  . Diabetes mellitus out of control ([place]) 12/17/2012  . Essential  hypertension, benign 12/17/2012  . Atherosclerotic peripheral vascular disease ([place]) 12/17/2012  . Hypercholesteremia     Past Surgical History:  Procedure Laterality Date  . 4th Toe Amputation Left Jan. 2014   4th toe in [place]  . AMPUTATION Left 12/08/2012   Procedure: AMPUTATION DIGIT;  Surgeon: Mcarthur Rossetti, MD;  Location: [place];  Service: Orthopedics;  Laterality: Left;  3rd toe amputation possible 5th toe amputation  .  AMPUTATION Left 12/11/2012   Procedure: Amputation of 2nd Toe;  Surgeon: Mcarthur Rossetti, MD;  Location: [place];  Service: Orthopedics;  Laterality: Left;  . AMPUTATION Right 06/29/2013   Procedure: AMPUTATION BELOW KNEE;  Surgeon: Newt Minion, MD;  Location: [place];  Service: Orthopedics;  Laterality: Right;  . AMPUTATION Left 08/29/2014   Procedure: Left Great Toe Amputation at MTP;  Surgeon: Newt Minion, MD;  Location: [place];  Service: Orthopedics;  Laterality: Left;  . AMPUTATION Left 10/31/2014   Procedure: Midfoot Amputation;  Surgeon: Newt Minion, MD;  Location: [place];  Service: Orthopedics;  Laterality: Left;  . BYPASS GRAFT POPLITEAL TO TIBIAL Left 12/13/2012   Procedure: BYPASS GRAFT POPLITEAL TO TIBIAL;  Surgeon: Serafina Mitchell, MD;  [facility];  Service: Vascular;  Laterality: Left;  Left Popliteal to Posterior Tibial Bypass Graft with reversed saphenous vein graft  . CARDIAC CATHETERIZATION    . COLONOSCOPY [name] 04/14/2017   Procedure: COLONOSCOPY;  Surgeon: Danie Binder, MD;  [facility];  Service: Endoscopy;  Laterality: [name];  1000  . I&D EXTREMITY Left 12/08/2012   Procedure: IRRIGATION AND DEBRIDEMENT EXTREMITY;  Surgeon: Mcarthur Rossetti, MD;  Location: [place];  Service: Orthopedics;  Laterality: Left;  . I&D EXTREMITY Left 12/11/2012   Procedure: REPEAT I&D LEFT FOOT;  Surgeon: Mcarthur Rossetti, MD;  Location: [place];  Service: Orthopedics;  Laterality: Left;  . INCISION AND DRAINAGE Right 06/25/2013   Procedure: INCISION AND DRAINAGE RIGHT FOOT;  Surgeon: Mcarthur Rossetti, MD;  [facility];  Service: Orthopedics;  Laterality: Right;  . LEFT HEART CATHETERIZATION WITH CORONARY ANGIOGRAM [name] 06/28/2013   Procedure: LEFT HEART CATHETERIZATION WITH CORONARY ANGIOGRAM;  Surgeon: Burnell Blanks, MD;  Location: [facility] CATH LAB;  Service: Cardiovascular;  Laterality: [name];  . MASS EXCISION [name] 11/15/2017   Procedure: EXCISION 4CM CYST ON  NECK;  Surgeon: Virl Cagey, MD;  [facility];  Service: General;  Laterality: [name];  . PENILE PROSTHESIS IMPLANT    . POLYPECTOMY  04/14/2017   Procedure: POLYPECTOMY;  Surgeon: Danie Binder, MD;  [facility];  Service: Endoscopy;;  colon  . STUMP REVISION Right 08/16/2013   Procedure: STUMP REVISION- right Below Knee Amputation;  Surgeon: Newt Minion, MD;  Location: [place];  Service: Orthopedics;  Laterality: Right;  . TEE WITHOUT CARDIOVERSION [name] 07/03/2013   Procedure: TRANSESOPHAGEAL ECHOCARDIOGRAM (TEE);  Surgeon: Larey Dresser, MD;  Location: [place];  Service: Cardiovascular;  Laterality: [name];  . TONSILLECTOMY AND ADENOIDECTOMY          Home Medications    Prior to Admission medications   Medication Sig Start Date End Date Taking? Authorizing Provider  aspirin EC 81 MG tablet Take 1 tablet (81 mg total) by mouth daily. 01/09/14   Kathie Dike, MD  atorvastatin (LIPITOR) 80 MG tablet Take 80 mg by mouth daily.    [provider]  carvedilol (COREG) 25 MG tablet Take 25 mg by mouth 2 (two) times  daily.     [provider]  furosemide (LASIX) 80 MG tablet Take 80 mg by mouth daily. May take additional 40 mg as needed for weight gain 08/08/13   Angiulli, Lavon Paganini, PA-C  HUMALOG KWIKPEN 100 UNIT/ML KiwkPen Inject 24 Units into the skin 3 (three) times daily before meals.  07/06/15   [provider]  hydrALAZINE (APRESOLINE) 10 MG tablet Take 1 tablet (10 mg total) by mouth 3 (three) times daily. 10/01/13   Burtis Junes, NP  losartan (COZAAR) 50 MG tablet TAKE 1 TABLET BY MOUTH  DAILY 07/23/18   Arnoldo Lenis, MD  mupirocin ointment (BACTROBAN) 2 % Apply 1 application topically daily. Patient taking differently: Apply 1 application topically once a week. Apply to left foot 03/22/17   Suzan Slick, NP  naproxen sodium (ANAPROX) 220 MG tablet Take 220 mg by mouth daily as needed (pain).    [provider]    nitroGLYCERIN (NITRODUR - DOSED IN MG/24 HR) 0.2 mg/hr patch Place 0.2 mg onto the skin once a week. Applies to foot/ankle area to increase blood flow 12/23/14   [provider]  pantoprazole (PROTONIX) 40 MG tablet Take 1 tablet (40 mg total) by mouth daily. 08/08/13   Angiulli, Lavon Paganini, PA-C  potassium chloride (K-DUR,KLOR-CON) 10 MEQ tablet Take 1 tablet (10 mEq total) by mouth daily. 04/08/15   Arnoldo Lenis, MD  senna-docusate (SENOKOT-S) 8.6-50 MG per tablet Take 1 tablet by mouth daily as needed for mild constipation.  08/08/13   Angiulli, Lavon Paganini, PA-C  TRESIBA FLEXTOUCH 100 UNIT/ML SOPN FlexTouch Pen Inject 57 Units into the skin daily. 07/23/18   [provider]  TRUVADA 200-300 MG tablet Take 1 tablet by mouth daily. 07/20/18   [provider]    Family History Family History  Problem Relation Age of Onset  . Pancreatic cancer Mother   . Diabetes Mother   . Hyperlipidemia Mother   . Diabetes Unknown   . Breast cancer Sister   . Depression Maternal Grandmother   . Heart disease Maternal Grandmother   . Depression Maternal Grandfather   . Heart disease Maternal Grandfather   . Depression Paternal Grandmother   . Heart disease Paternal Grandmother   . Depression Paternal Grandfather   . Heart disease Paternal Grandfather     Social History Social History   Tobacco Use  . Smoking status: Never Smoker  . Smokeless tobacco: Never Used  Substance Use Topics  . Alcohol use: No    Alcohol/week: 0.0 standard drinks  . Drug use: No     Allergies   Amoxicillin   Review of Systems Review of Systems  Constitutional: Positive for activity change.       All ROS Neg except as noted in HPI  HENT: Positive for congestion. Negative for nosebleeds.   Eyes: Negative for photophobia and discharge.  Respiratory: Positive for cough. Negative for shortness of breath and wheezing.   Cardiovascular: Negative for chest pain and palpitations.   Gastrointestinal: Negative for abdominal pain and blood in stool.  Genitourinary: Negative for dysuria, frequency and hematuria.  Musculoskeletal: Positive for myalgias. Negative for arthralgias, back pain and neck pain.  Skin: Negative.   Neurological: Negative for dizziness, seizures and speech difficulty.  Psychiatric/Behavioral: Negative for confusion and hallucinations.     Physical Exam Updated Vital Signs BP (!) 142/72 (BP Location: Left Arm)   Pulse 89   Temp (!) 100.4 F (38 C) (Oral)   Resp 20  Ht 5\' 11"  (1.803 m)   Wt 119.3 kg   SpO2 93%   BMI 36.68 kg/m   Physical Exam  Constitutional: He is oriented to person, place, and time. He appears well-developed and well-nourished.  Non-toxic appearance.  HENT:  Head: Normocephalic.  Right Ear: Tympanic membrane and external ear normal.  Left Ear: Tympanic membrane and external ear normal.  Nasal congestion present.  Eyes: Pupils are equal, round, and reactive to light. EOM and lids are normal.  Neck: Normal range of motion. Neck supple. Carotid bruit is not present.  Cardiovascular: Normal rate, regular rhythm, normal heart sounds, intact distal pulses and normal pulses.  Pulmonary/Chest: No respiratory distress. He has no wheezes.  There is symmetrical rise and fall of the chest.  The patient speaks in complete sentences.  Coarse breath sounds heard throughout the lung fields.  Scattered rhonchi heard occasionally.  Abdominal: Soft. Bowel sounds are normal. There is no tenderness. There is no guarding.  Musculoskeletal: Normal range of motion.  Lymphadenopathy:       Head (right side): No submandibular adenopathy present.       Head (left side): No submandibular adenopathy present.    He has no cervical adenopathy.  Neurological: He is alert and oriented to person, place, and time. He has normal strength. No cranial nerve deficit or sensory deficit.  Skin: Skin is warm and dry.  Psychiatric: He has a normal mood and  affect. His speech is normal.  Nursing note and vitals reviewed.    ED Treatments / Results  Labs (all labs ordered are listed, but only abnormal results are displayed) Labs Reviewed - No data to display  EKG None  Radiology No results found.  Procedures Procedures (including critical care time)  Medications Ordered in ED Medications - No data to display   Initial Impression / Assessment and Plan / ED Course  I have reviewed the triage vital signs and the nursing notes.  Pertinent labs & imaging results that were available during my care of the patient were reviewed by me and considered in my medical decision making (see chart for details).       Final Clinical Impressions(s) / ED Diagnoses MDM  Temperature elevated at 100.4, vital signs otherwise within normal limits.  Pulse oximetry is 93% on room air.  Congestion noted on the examination.  Patient speaks in complete sentences.  Chest x-ray shows mild cardiomegaly, linear atelectasis or scarring at the right base, no pneumothorax, and no consolidation.  No effusion noted.  Given the patient's diabetic status and suspected bronchitis.  Patient will be treated with Hycodan for cough, Claritin-D for congestion, Zithromax.  Patient will use Tylenol extra strength for fever or aching.  Have asked the patient increase fluids.  I have asked him to monitor his glucose carefully.  Of also asked the patient to see his primary physician or return to the emergency department if any changes in his condition, worsening of his condition, problems, or concerns.  Patient is in agreement with this plan.   Final diagnoses:  Bronchitis  Acute sinusitis, recurrence not specified, unspecified location  Upper respiratory tract infection, unspecified type    ED Discharge Orders         Ordered    azithromycin (ZITHROMAX) 250 MG tablet     09/14/18 1751    HYDROcodone-homatropine (HYCODAN) 5-1.5 MG/5ML syrup  Every 6 hours PRN      09/14/18 1751    loratadine-pseudoephedrine (CLARITIN-D 12 HOUR) 5-120 MG tablet  2 times daily     09/14/18 1751           Lily Kocher, PA-C 09/14/18 1832    Maudie Flakes, MD 09/14/18 2029

## 2018-09-14 NOTE — ED Triage Notes (Signed)
Patient complaining of cough and congestion x 3 days. States he is coughing up yellow colored sputum.

## 2018-09-14 NOTE — Discharge Instructions (Addendum)
Temperature is elevated at 100.4.  Your oxygen level is within normal limits, but a little bit lower than your usual numbers.  Please practice cough and deep breathing when possible.  Your chest x-ray is negative for pneumonia, collapsed lung, or other emergent changes.  There is question of some mild bronchitis changes present.  Please increase fluids.  Use Tylenol extra strength every 4 hours for fever, and/or aching.  Use Zithromax daily starting November 30.  Use Claritin-D 2 times daily or every 12 hours.  Use Hycodan for cough. This medication may cause drowsiness. Please do not drink, drive, or participate in activity that requires concentration while taking this medication. Monitor your glucose closely during this problem.

## 2018-09-24 DIAGNOSIS — H6121 Impacted cerumen, right ear: Secondary | ICD-10-CM | POA: Diagnosis not present

## 2018-09-24 DIAGNOSIS — Z23 Encounter for immunization: Secondary | ICD-10-CM | POA: Diagnosis not present

## 2018-10-17 ENCOUNTER — Other Ambulatory Visit: Payer: Self-pay

## 2018-10-17 ENCOUNTER — Emergency Department (HOSPITAL_COMMUNITY)
Admission: EM | Admit: 2018-10-17 | Discharge: 2018-10-17 | Disposition: A | Discharge status: Home / Self Care | Payer: Medicare Other | Attending: Emergency Medicine | Admitting: Emergency Medicine

## 2018-10-17 ENCOUNTER — Emergency Department (HOSPITAL_COMMUNITY): Payer: Medicare Other

## 2018-10-17 ENCOUNTER — Encounter (HOSPITAL_COMMUNITY): Payer: Self-pay | Admitting: Emergency Medicine

## 2018-10-17 DIAGNOSIS — E119 Type 2 diabetes mellitus without complications: Secondary | ICD-10-CM | POA: Diagnosis not present

## 2018-10-17 DIAGNOSIS — I2510 Atherosclerotic heart disease of native coronary artery without angina pectoris: Secondary | ICD-10-CM | POA: Insufficient documentation

## 2018-10-17 DIAGNOSIS — Z7982 Long term (current) use of aspirin: Secondary | ICD-10-CM | POA: Insufficient documentation

## 2018-10-17 DIAGNOSIS — J069 Acute upper respiratory infection, unspecified: Secondary | ICD-10-CM | POA: Diagnosis not present

## 2018-10-17 DIAGNOSIS — Z794 Long term (current) use of insulin: Secondary | ICD-10-CM | POA: Insufficient documentation

## 2018-10-17 DIAGNOSIS — R05 Cough: Secondary | ICD-10-CM | POA: Diagnosis not present

## 2018-10-17 DIAGNOSIS — Z79899 Other long term (current) drug therapy: Secondary | ICD-10-CM | POA: Diagnosis not present

## 2018-10-17 DIAGNOSIS — B9789 Other viral agents as the cause of diseases classified elsewhere: Secondary | ICD-10-CM | POA: Diagnosis not present

## 2018-10-17 DIAGNOSIS — I10 Essential (primary) hypertension: Secondary | ICD-10-CM | POA: Insufficient documentation

## 2018-10-17 MED ORDER — ACETAMINOPHEN 325 MG PO TABS
650.0000 mg | ORAL_TABLET | Freq: Once | ORAL | Status: AC
  Administered 2018-10-17: 650 mg via ORAL
  Filled 2018-10-17: qty 2

## 2018-10-17 MED ORDER — OSELTAMIVIR PHOSPHATE 75 MG PO CAPS: 75.0000 mg | ORAL_CAPSULE | Freq: Two times a day (BID) | ORAL | 0 refills | Status: DC

## 2018-10-17 NOTE — ED Notes (Signed)
EDP at bedside  

## 2018-10-17 NOTE — ED Provider Notes (Signed)
Medical screening examination/treatment/procedure(s) were conducted as a shared visit with non-physician practitioner(s) and myself.  I personally evaluated the patient during the encounter.  None   Patient seen by me along with physician assistant.  Patient with onset of upper respiratory may be flulike symptoms starting on Monday.  Patient did have the flu shot this year.  Work-up here negative for pneumonia.  It is appropriate to treat him with Tamiflu.  Based on the onset of his symptoms.  Prescription provided patient will return for any new or worse symptoms.  Nontoxic no acute distress here.  On blood pressure being elevated at 158/89 follow-up with his regular doctor for that will be appropriate.  Patient does have a significant history of peripheral vascular disease.  No sniffing complaints with that today.  He is alert and oriented lungs were clear bilaterally no wheezing at the time I listened.  Patient not tachycardic.  No fevers here today.  Oxygen saturations was 96%.  Results for orders placed or performed during the hospital encounter of 11/15/17  Glucose, capillary  Result Value Ref Range   Glucose-Capillary 187 (H) 65 - 99 mg/dL  Glucose, capillary  Result Value Ref Range   Glucose-Capillary 145 (H) 65 - 99 mg/dL   Dg Chest 2 View  Result Date: 10/17/2018 CLINICAL DATA:  58 year old male with history of sinus congestion and mild cough for the past week, worsening yesterday evening. EXAM: CHEST - 2 VIEW COMPARISON:  Chest x-ray 09/14/2018. FINDINGS: Lung volumes are normal. Mild linear scarring in the right lung base, stable compared to the prior study. No consolidative airspace disease. No pleural effusions. No pneumothorax. No pulmonary nodule or mass noted. Pulmonary vasculature and the cardiomediastinal silhouette are within normal limits. IMPRESSION: No radiographic evidence of acute cardiopulmonary disease. Electronically Signed   By: Vinnie Langton M.D.   On:  10/17/2018 10:47      Fredia Sorrow, MD 10/17/18 1209

## 2018-10-17 NOTE — ED Provider Notes (Signed)
[facility] EMERGENCY DEPARTMENT Provider Note   CSN: [id number] Arrival date & time: 10/17/18  4580     History   Chief Complaint Chief Complaint  Patient presents with  . Nasal Congestion    HPI Albert [name] is a 58 y.o. male with multiple comorbidities presenting today for rhinorrhea, congestion and cough.  Patient states that his symptoms began approximately 2-3 days ago and have been gradually worsening since time of onset, particularly on Monday, 2 days ago.  Patient states that he has had clear nasal discharge with occasional bloody streaks for the past 2 days.  He denies sinus pain/pressure.  Patient also endorses mild nonproductive cough for the past 4 days, denies hemoptysis.  Patient denies fever, vision changes, chest pain/back pain, shortness of breath, abdominal pain, nausea/vomiting, diarrhea, sore throat/feeling of throat closing or any additional concerns today.  HPI  Past Medical History:  Diagnosis Date  . Allergic rhinitis   . Anemia    takes supplement  . Chronic cough   . Chronic sinusitis   . Complication of anesthesia    couldn't swallow and talk  . Coronary artery disease   . CVA (cerebral infarction) 2011   R hand deficit  . Diabetes mellitus without complication ([place]) ~9983   last HbA1c ~9 .... fasting 160s  . GERD (gastroesophageal reflux disease)   . Heart murmur   . Hypercholesteremia   . Hypertension   . Myocardial infarction ([place]) 2014  . Osteomyelitis ([place])    first and third metatarsal  . Pneumonia    hx of walking  . Splenic infarct    setting of lupus anticoagulant  . Stroke [facility]) 2011   right arm weakness    Patient Active Problem List   Diagnosis Date Noted  . Sebaceous cyst   . Amputated below knee, right ([place]) 10/19/2017  . History of transmetatarsal amputation of left foot ([place]) 06/12/2017  . Special screening for malignant neoplasms, colon   . Midfoot ulcer, left, limited to breakdown of skin ([place]) 11/18/2016  .  ARF (acute renal failure) ([place]) 02/08/2016  . Chronic combined systolic and diastolic CHF (congestive heart failure) ([place]) 02/08/2016  . Splenic infarct 01/06/2014  . Acute systolic CHF (congestive heart failure) ([place]) 08/03/2013  . Hx of right BKA ([place]) 07/26/2013  . Anemia 07/15/2013  . Hyperkalemia 07/15/2013  . Hyposmolality and/or hyponatremia 07/15/2013  . Acute respiratory failure ([place]) 07/15/2013  . Mitral regurgitation 07/15/2013  . Oliguria 07/15/2013  . Bacteremia due to Staphylococcus aureus 07/15/2013  . Hyperchloremia 07/15/2013  . Acute kidney failure ([place]) 07/14/2013  . Hypotension, unspecified 07/14/2013  . Acute systolic heart failure ([place]) 07/12/2013  . Acute pulmonary edema ([place]) 07/11/2013  . Pneumonia, organism unspecified(486) 07/11/2013  . Hypoxemia 07/11/2013  . Cardiomyopathy, ischemic 07/03/2013  . Coronary atherosclerosis of native coronary artery 07/01/2013  . NSTEMI (non-ST elevated myocardial infarction) ([place]) 06/26/2013  . AKI (acute kidney injury) ([place]) 06/25/2013  . Shock circulatory ([place]) 06/25/2013  . Dehydration with hyponatremia 06/25/2013  . Leukocytosis, unspecified 06/25/2013  . Sepsis(995.91) 06/25/2013  . Aftercare following surgery of the circulatory system, [place] 02/11/2013  . Peripheral vascular disease, unspecified ([place]) 12/31/2012  . Diabetes mellitus out of control ([place]) 12/17/2012  . Essential hypertension, benign 12/17/2012  . Atherosclerotic peripheral vascular disease ([place]) 12/17/2012  . Hypercholesteremia     Past Surgical History:  Procedure Laterality Date  . 4th Toe Amputation Left Jan. 2014   4th toe in [place]  . AMPUTATION Left 12/08/2012  Procedure: AMPUTATION DIGIT;  Surgeon: Mcarthur Rossetti, MD;  Location: [place];  Service: Orthopedics;  Laterality: Left;  3rd toe amputation possible 5th toe amputation  . AMPUTATION Left 12/11/2012   Procedure: Amputation of 2nd Toe;  Surgeon: Mcarthur Rossetti, MD;   Location: [place];  Service: Orthopedics;  Laterality: Left;  . AMPUTATION Right 06/29/2013   Procedure: AMPUTATION BELOW KNEE;  Surgeon: Newt Minion, MD;  Location: [place];  Service: Orthopedics;  Laterality: Right;  . AMPUTATION Left 08/29/2014   Procedure: Left Great Toe Amputation at MTP;  Surgeon: Newt Minion, MD;  Location: [place];  Service: Orthopedics;  Laterality: Left;  . AMPUTATION Left 10/31/2014   Procedure: Midfoot Amputation;  Surgeon: Newt Minion, MD;  Location: [place];  Service: Orthopedics;  Laterality: Left;  . BYPASS GRAFT POPLITEAL TO TIBIAL Left 12/13/2012   Procedure: BYPASS GRAFT POPLITEAL TO TIBIAL;  Surgeon: Serafina Mitchell, MD;  Location: [facility];  Service: Vascular;  Laterality: Left;  Left Popliteal to Posterior Tibial Bypass Graft with reversed saphenous vein graft  . CARDIAC CATHETERIZATION    . COLONOSCOPY [name] 04/14/2017   Procedure: COLONOSCOPY;  Surgeon: Danie Binder, MD;  [facility];  Service: Endoscopy;  Laterality: [name];  1000  . I&D EXTREMITY Left 12/08/2012   Procedure: IRRIGATION AND DEBRIDEMENT EXTREMITY;  Surgeon: Mcarthur Rossetti, MD;  Location: [place];  Service: Orthopedics;  Laterality: Left;  . I&D EXTREMITY Left 12/11/2012   Procedure: REPEAT I&D LEFT FOOT;  Surgeon: Mcarthur Rossetti, MD;  Location: [place];  Service: Orthopedics;  Laterality: Left;  . INCISION AND DRAINAGE Right 06/25/2013   Procedure: INCISION AND DRAINAGE RIGHT FOOT;  Surgeon: Mcarthur Rossetti, MD;  Location: [facility];  Service: Orthopedics;  Laterality: Right;  . LEFT HEART CATHETERIZATION WITH CORONARY ANGIOGRAM [name] 06/28/2013   Procedure: LEFT HEART CATHETERIZATION WITH CORONARY ANGIOGRAM;  Surgeon: Burnell Blanks, MD;  Location: [facility] C[facility] LAB;  Service: Cardiovascular;  Laterality: [name];  . MASS EXCISION [name] 11/15/2017   Procedure: EXCISION 4CM CYST ON NECK;  Surgeon: Virl Cagey, MD;  Location: [facility];  Service: General;  Laterality: [name];  .  PENILE PROSTHESIS IMPLANT    . POLYPECTOMY  04/14/2017   Procedure: POLYPECTOMY;  Surgeon: Danie Binder, MD;  [facility];  Service: Endoscopy;;  colon  . STUMP REVISION Right 08/16/2013   Procedure: STUMP REVISION- right Below Knee Amputation;  Surgeon: Newt Minion, MD;  Location: [place];  Service: Orthopedics;  Laterality: Right;  . TEE WITHOUT CARDIOVERSION [name] 07/03/2013   Procedure: TRANSESOPHAGEAL ECHOCARDIOGRAM (TEE);  Surgeon: Larey Dresser, MD;  Location: [place];  Service: Cardiovascular;  Laterality: [name];  . TONSILLECTOMY AND ADENOIDECTOMY        Home Medications    Prior to Admission medications   Medication Sig Start Date End Date Taking? Authorizing Provider  aspirin EC 81 MG tablet Take 1 tablet (81 mg total) by mouth daily. 01/09/14  Yes Kathie Dike, MD  atorvastatin (LIPITOR) 80 MG tablet Take 80 mg by mouth daily.   Yes [provider]  carvedilol (COREG) 25 MG tablet Take 25 mg by mouth 2 (two) times daily.    Yes [provider]  furosemide (LASIX) 80 MG tablet Take 80 mg by mouth daily. May take additional 40 mg as needed for weight gain 08/08/13  Yes Angiulli, Lavon Paganini, PA-C  HUMALOG KWIKPEN 100 UNIT/ML KiwkPen Inject 24 Units into the  skin 3 (three) times daily before meals.  07/06/15  Yes [provider]  hydrALAZINE (APRESOLINE) 10 MG tablet Take 1 tablet (10 mg total) by mouth 3 (three) times daily. 10/01/13  Yes Burtis Junes, NP  losartan (COZAAR) 50 MG tablet TAKE 1 TABLET BY MOUTH  DAILY 07/23/18  Yes Branch, Alphonse Guild, MD  mupirocin ointment (BACTROBAN) 2 % Apply 1 application topically daily. Patient taking differently: Apply 1 application topically once a week. Apply to left foot 03/22/17  Yes Dondra Prader R, NP  naproxen sodium (ANAPROX) 220 MG tablet Take 220 mg by mouth daily as needed (pain).   Yes [provider]  nitroGLYCERIN (NITRODUR - DOSED IN MG/24 HR) 0.2 mg/hr patch Place 0.2 mg onto the  skin once a week. Applies to foot/ankle area to increase blood flow 12/23/14  Yes [provider]  pantoprazole (PROTONIX) 40 MG tablet Take 1 tablet (40 mg total) by mouth daily. 08/08/13  Yes Angiulli, Lavon Paganini, PA-C  potassium chloride (K-DUR,KLOR-CON) 10 MEQ tablet Take 1 tablet (10 mEq total) by mouth daily. 04/08/15  Yes BranchAlphonse Guild, MD  senna-docusate (SENOKOT-S) 8.6-50 MG per tablet Take 1 tablet by mouth daily as needed for mild constipation.  08/08/13  Yes Angiulli, Lavon Paganini, PA-C  TRESIBA FLEXTOUCH 100 UNIT/ML SOPN FlexTouch Pen Inject 57 Units into the skin daily. 07/23/18  Yes [provider]  TRUVADA 200-300 MG tablet Take 1 tablet by mouth daily. 07/20/18  Yes [provider]  azithromycin (ZITHROMAX) 250 MG tablet 1 po daily with food Patient not taking: Reported on 10/17/2018 09/14/18   Lily Kocher, PA-C  HYDROcodone-homatropine [facility]) 5-1.5 MG/5ML syrup Take 5 mLs by mouth every 6 (six) hours as needed. Patient not taking: Reported on 10/17/2018 09/14/18   Lily Kocher, PA-C  loratadine-pseudoephedrine (CLARITIN-D 12 HOUR) 5-120 MG tablet Take 1 tablet by mouth 2 (two) times daily. Patient not taking: Reported on 10/17/2018 09/14/18   Lily Kocher, PA-C  oseltamivir (TAMIFLU) 75 MG capsule Take 1 capsule (75 mg total) by mouth every 12 (twelve) hours. 10/17/18   Deliah Boston, PA-C    Family History Family History  Problem Relation Age of Onset  . Pancreatic cancer Mother   . Diabetes Mother   . Hyperlipidemia Mother   . Diabetes Other   . Breast cancer Sister   . Depression Maternal Grandmother   . Heart disease Maternal Grandmother   . Depression Maternal Grandfather   . Heart disease Maternal Grandfather   . Depression Paternal Grandmother   . Heart disease Paternal Grandmother   . Depression Paternal Grandfather   . Heart disease Paternal Grandfather     Social History Social History   Tobacco Use  . Smoking status: Never  Smoker  . Smokeless tobacco: Never Used  Substance Use Topics  . Alcohol use: No    Alcohol/week: 0.0 standard drinks  . Drug use: No     Allergies   Amoxicillin   Review of Systems Review of Systems  Constitutional: Negative.  Negative for chills and fever.  HENT: Positive for nosebleeds and rhinorrhea. Negative for drooling, facial swelling, sore throat, trouble swallowing and voice change.   Respiratory: Positive for cough. Negative for shortness of breath.   Cardiovascular: Negative.  Negative for chest pain and leg swelling.  Gastrointestinal: Negative.  Negative for abdominal pain, diarrhea, nausea and vomiting.  Musculoskeletal: Negative.  Negative for arthralgias and myalgias.  Neurological: Negative.  Negative for dizziness, weakness and headaches.  All  other systems reviewed and are negative.  Physical Exam Updated Vital Signs BP (!) 158/89 (BP Location: Right Arm)   Pulse 97   Temp 98.5 F (36.9 C) (Oral)   Resp 18   Ht 5\' 11"  (1.803 m)   Wt 119.3 kg   SpO2 96%   BMI 36.68 kg/m   Physical Exam Constitutional:      General: He is not in acute distress.    Appearance: He is well-developed. He is obese. He is not ill-appearing or diaphoretic.  HENT:     Head: Normocephalic and atraumatic.     Right Ear: Tympanic membrane, ear canal and external ear normal.     Left Ear: Tympanic membrane, ear canal and external ear normal.     Nose: Congestion and rhinorrhea present. Rhinorrhea is clear.     Right Sinus: No maxillary sinus tenderness or frontal sinus tenderness.     Left Sinus: No maxillary sinus tenderness or frontal sinus tenderness.     Mouth/Throat:     Mouth: Mucous membranes are moist.     Pharynx: Oropharynx is clear.     Comments: The patient has normal phonation and is in control of secretions. No stridor.  Midline uvula without edema. Soft palate rises symmetrically. No tonsillar erythema, swelling or exudates. Tongue protrusion is normal, floor of  mouth is soft. No trismus. No creptius on neck palpation. No gingival erythema or fluctuance noted. Mucus membranes moist. Eyes:     General: Vision grossly intact. Gaze aligned appropriately.     Extraocular Movements: Extraocular movements intact.     Conjunctiva/sclera: Conjunctivae normal.     Pupils: Pupils are equal, round, and reactive to light.  Neck:     Musculoskeletal: Full passive range of motion without pain, normal range of motion and neck supple. No edema or erythema.     Trachea: Trachea normal. No tracheal deviation.  Cardiovascular:     Rate and Rhythm: Normal rate and regular rhythm.     Heart sounds: Normal heart sounds.  Pulmonary:     Effort: Pulmonary effort is normal. No respiratory distress.     Breath sounds: Normal breath sounds and air entry.  Chest:     Chest wall: No tenderness.  Abdominal:     General: Bowel sounds are normal.     Palpations: Abdomen is soft.     Tenderness: There is no abdominal tenderness. There is no guarding or rebound.  Musculoskeletal: Normal range of motion.  Feet:     Right foot:     Amputation: Right leg is amputated below knee.     Left foot:     Amputation: Left leg is amputated below ankle.  Skin:    General: Skin is warm and dry.  Neurological:     Mental Status: He is alert and oriented to person, place, and time.     GCS: GCS eye subscore is 4. GCS verbal subscore is 5. GCS motor subscore is 6.  Psychiatric:        Mood and Affect: Mood normal.        Behavior: Behavior normal.      ED Treatments / Results  Labs (all labs ordered are listed, but only abnormal results are displayed) Labs Reviewed - No data to display  EKG None  Radiology Dg Chest 2 View  Result Date: 10/17/2018 CLINICAL DATA:  58 year old male with history of sinus congestion and mild cough for the past week, worsening yesterday evening. EXAM: CHEST - 2 VIEW  COMPARISON:  Chest x-ray 09/14/2018. FINDINGS: Lung volumes are normal. Mild linear  scarring in the right lung base, stable compared to the prior study. No consolidative airspace disease. No pleural effusions. No pneumothorax. No pulmonary nodule or mass noted. Pulmonary vasculature and the cardiomediastinal silhouette are within normal limits. IMPRESSION: No radiographic evidence of acute cardiopulmonary disease. Electronically Signed   By: Vinnie Langton M.D.   On: 10/17/2018 10:47    Procedures Procedures (including critical care time)  Medications Ordered in ED Medications  acetaminophen (TYLENOL) tablet 650 mg (650 mg Oral Given 10/17/18 1036)     Initial Impression / Assessment and Plan / ED Course  I have reviewed the triage vital signs and the nursing notes.  Pertinent labs & imaging results that were available during my care of the patient were reviewed by me and considered in my medical decision making (see chart for details).    90:68 AM: 58 year old male with multiple comorbidities presenting today with his wife for rhinorrhea and nonproductive cough for approximately 2-3 days, worsening Monday.  No history of fever, hemoptysis, sinus pressure/pain or other symptoms to report.  Patient resting comfortably in no acute distress.  Vital signs stable on arrival.  Case discussed with Dr. Rogene Houston, to see patient. -------------------------- Chest x-ray negative  Patient symptoms today consistent with viral upper respiratory tract infection.  Patient does have multiple comorbidities and seems to be near the window for Tamiflu.  Patient was seen and evaluated by Dr. Rogene Houston who advises starting patient on Tamiflu for his symptoms.  Risks versus benefits of Tamiflu discussed with patient and his wife, they agree to treatment with Tamiflu.  Prescription given.  Encouraged oral rehydration and rest.    At this time there does not appear to be any evidence of an acute emergency medical condition and the patient appears stable for discharge with appropriate outpatient  follow up. Diagnosis was discussed with patient who verbalizes understanding of care plan and is agreeable to discharge. I have discussed return precautions with patient and wife who verbalize understanding of return precautions. Patient strongly encouraged to follow-up with their PCP this week. All questions answered.  Patient also seen and evaluated by Dr. Rogene Houston today who agrees with plan of care, Tamiflu and discharge at this time.  Note: Portions of this report may have been transcribed using voice recognition software. Every effort was made to ensure accuracy; however, inadvertent computerized transcription errors may still be present. Final Clinical Impressions(s) / ED Diagnoses   Final diagnoses:  Viral upper respiratory tract infection    ED Discharge Orders         Ordered    oseltamivir (TAMIFLU) 75 MG capsule  Every 12 hours     10/17/18 1203           Gari Crown 10/17/18 1710    Fredia Sorrow, MD 10/22/18 1134

## 2018-10-17 NOTE — Discharge Instructions (Signed)
You have been diagnosed today with Viral Upper Respiratory Tract Infection   At this time there does not appear to be the presence of an emergent medical condition, however there is always the potential for conditions to change. Please read and follow the below instructions.  Please return to the Emergency Department immediately for any new or worsening symptoms or if your symptoms do not improve within 2 days. Please be sure to follow up with your Primary Care Provider this week regarding your visit today; please call their office to schedule an appointment even if you are feeling better for a follow-up visit. You may begin using the medication Tamiflu as prescribed to potentially shorten the course of your illness.  Get help right away if: You have shortness of breath. You have severe or persistent: Headache. Ear pain. Sinus pain. Chest pain. You have chronic lung disease along with any of the following: Wheezing. Prolonged cough. Coughing up blood. A change in your usual mucus. You have a stiff neck. You have changes in your: Vision. Hearing. Thinking. Mood. You are getting worse instead of better. You have a fever or chills. Your mucus is brown or red. You have yellow or brown discharge coming from your nose. You have pain in your face, especially when you bend forward. You have swollen neck glands. You have pain while swallowing. You have white areas in the back of your throat.  Please read the additional information packets attached to your discharge summary.  Do not take your medicine if  develop an itchy rash, swelling in your mouth or lips, or difficulty breathing.

## 2018-10-17 NOTE — ED Provider Notes (Signed)
      Fredia Sorrow, MD 10/17/18 [phone]

## 2018-10-17 NOTE — ED Notes (Signed)
Pt transported to xray 

## 2018-10-17 NOTE — ED Triage Notes (Signed)
Pt states he has had a lot of sinus congestion and a little cough for about a week worsening last night.

## 2018-11-15 DIAGNOSIS — I10 Essential (primary) hypertension: Secondary | ICD-10-CM | POA: Diagnosis not present

## 2018-11-15 DIAGNOSIS — E1165 Type 2 diabetes mellitus with hyperglycemia: Secondary | ICD-10-CM | POA: Diagnosis not present

## 2018-11-15 DIAGNOSIS — E782 Mixed hyperlipidemia: Secondary | ICD-10-CM | POA: Diagnosis not present

## 2018-11-15 DIAGNOSIS — D509 Iron deficiency anemia, unspecified: Secondary | ICD-10-CM | POA: Diagnosis not present

## 2018-11-19 DIAGNOSIS — I10 Essential (primary) hypertension: Secondary | ICD-10-CM | POA: Diagnosis not present

## 2018-11-19 DIAGNOSIS — E1165 Type 2 diabetes mellitus with hyperglycemia: Secondary | ICD-10-CM | POA: Diagnosis not present

## 2018-11-20 DIAGNOSIS — I2510 Atherosclerotic heart disease of native coronary artery without angina pectoris: Secondary | ICD-10-CM | POA: Diagnosis not present

## 2018-11-20 DIAGNOSIS — I5032 Chronic diastolic (congestive) heart failure: Secondary | ICD-10-CM | POA: Diagnosis not present

## 2018-11-20 DIAGNOSIS — Z89511 Acquired absence of right leg below knee: Secondary | ICD-10-CM | POA: Diagnosis not present

## 2018-11-20 DIAGNOSIS — E1165 Type 2 diabetes mellitus with hyperglycemia: Secondary | ICD-10-CM | POA: Diagnosis not present

## 2018-11-20 DIAGNOSIS — N182 Chronic kidney disease, stage 2 (mild): Secondary | ICD-10-CM | POA: Diagnosis not present

## 2018-12-02 ENCOUNTER — Other Ambulatory Visit: Payer: Self-pay | Admitting: Cardiology

## 2018-12-03 ENCOUNTER — Ambulatory Visit (INDEPENDENT_AMBULATORY_CARE_PROVIDER_SITE_OTHER): Payer: Medicare Other | Admitting: Orthopedic Surgery

## 2018-12-27 ENCOUNTER — Emergency Department (HOSPITAL_COMMUNITY): Payer: Medicare Other

## 2018-12-27 ENCOUNTER — Encounter (HOSPITAL_COMMUNITY): Payer: Self-pay

## 2018-12-27 ENCOUNTER — Other Ambulatory Visit: Payer: Self-pay

## 2018-12-27 ENCOUNTER — Emergency Department (HOSPITAL_COMMUNITY)
Admission: EM | Admit: 2018-12-27 | Discharge: 2018-12-27 | Disposition: A | Discharge status: Home / Self Care | Payer: Medicare Other | Source: Home / Self Care | Attending: Emergency Medicine | Admitting: Emergency Medicine

## 2018-12-27 DIAGNOSIS — N183 Chronic kidney disease, stage 3 (moderate): Secondary | ICD-10-CM | POA: Diagnosis not present

## 2018-12-27 DIAGNOSIS — R05 Cough: Secondary | ICD-10-CM | POA: Diagnosis not present

## 2018-12-27 DIAGNOSIS — Z89511 Acquired absence of right leg below knee: Secondary | ICD-10-CM | POA: Diagnosis not present

## 2018-12-27 DIAGNOSIS — Z7982 Long term (current) use of aspirin: Secondary | ICD-10-CM | POA: Insufficient documentation

## 2018-12-27 DIAGNOSIS — Z794 Long term (current) use of insulin: Secondary | ICD-10-CM | POA: Diagnosis not present

## 2018-12-27 DIAGNOSIS — I5042 Chronic combined systolic (congestive) and diastolic (congestive) heart failure: Secondary | ICD-10-CM

## 2018-12-27 DIAGNOSIS — I252 Old myocardial infarction: Secondary | ICD-10-CM | POA: Diagnosis not present

## 2018-12-27 DIAGNOSIS — Z803 Family history of malignant neoplasm of breast: Secondary | ICD-10-CM | POA: Diagnosis not present

## 2018-12-27 DIAGNOSIS — I2510 Atherosclerotic heart disease of native coronary artery without angina pectoris: Secondary | ICD-10-CM

## 2018-12-27 DIAGNOSIS — I110 Hypertensive heart disease with heart failure: Secondary | ICD-10-CM | POA: Insufficient documentation

## 2018-12-27 DIAGNOSIS — Z89422 Acquired absence of other left toe(s): Secondary | ICD-10-CM | POA: Diagnosis not present

## 2018-12-27 DIAGNOSIS — E119 Type 2 diabetes mellitus without complications: Secondary | ICD-10-CM

## 2018-12-27 DIAGNOSIS — E1122 Type 2 diabetes mellitus with diabetic chronic kidney disease: Secondary | ICD-10-CM | POA: Diagnosis not present

## 2018-12-27 DIAGNOSIS — L03116 Cellulitis of left lower limb: Secondary | ICD-10-CM | POA: Diagnosis not present

## 2018-12-27 DIAGNOSIS — Z79899 Other long term (current) drug therapy: Secondary | ICD-10-CM

## 2018-12-27 DIAGNOSIS — I70202 Unspecified atherosclerosis of native arteries of extremities, left leg: Secondary | ICD-10-CM | POA: Diagnosis not present

## 2018-12-27 DIAGNOSIS — I129 Hypertensive chronic kidney disease with stage 1 through stage 4 chronic kidney disease, or unspecified chronic kidney disease: Secondary | ICD-10-CM | POA: Diagnosis not present

## 2018-12-27 DIAGNOSIS — Z833 Family history of diabetes mellitus: Secondary | ICD-10-CM | POA: Diagnosis not present

## 2018-12-27 DIAGNOSIS — E785 Hyperlipidemia, unspecified: Secondary | ICD-10-CM | POA: Diagnosis not present

## 2018-12-27 DIAGNOSIS — E1151 Type 2 diabetes mellitus with diabetic peripheral angiopathy without gangrene: Secondary | ICD-10-CM | POA: Diagnosis not present

## 2018-12-27 DIAGNOSIS — E1165 Type 2 diabetes mellitus with hyperglycemia: Secondary | ICD-10-CM | POA: Diagnosis not present

## 2018-12-27 DIAGNOSIS — Z8673 Personal history of transient ischemic attack (TIA), and cerebral infarction without residual deficits: Secondary | ICD-10-CM | POA: Diagnosis not present

## 2018-12-27 DIAGNOSIS — M7740 Metatarsalgia, unspecified foot: Secondary | ICD-10-CM | POA: Diagnosis not present

## 2018-12-27 DIAGNOSIS — J069 Acute upper respiratory infection, unspecified: Secondary | ICD-10-CM | POA: Insufficient documentation

## 2018-12-27 DIAGNOSIS — Z800 Family history of malignant neoplasm of digestive organs: Secondary | ICD-10-CM | POA: Diagnosis not present

## 2018-12-27 DIAGNOSIS — I70209 Unspecified atherosclerosis of native arteries of extremities, unspecified extremity: Secondary | ICD-10-CM | POA: Diagnosis not present

## 2018-12-27 DIAGNOSIS — Z8349 Family history of other endocrine, nutritional and metabolic diseases: Secondary | ICD-10-CM | POA: Diagnosis not present

## 2018-12-27 LAB — CBC WITH DIFFERENTIAL/PLATELET
Abs Immature Granulocytes: 0.1 10*3/uL — ABNORMAL HIGH (ref 0.00–0.07)
Basophils Absolute: 0 10*3/uL (ref 0.0–0.1)
Basophils Relative: 0 %
Eosinophils Absolute: 0 10*3/uL (ref 0.0–0.5)
Eosinophils Relative: 0 %
HCT: 41.4 % (ref 39.0–52.0)
Hemoglobin: 13.4 g/dL (ref 13.0–17.0)
Immature Granulocytes: 1 %
LYMPHS PCT: 8 %
Lymphs Abs: 1 10*3/uL (ref 0.7–4.0)
MCH: 28 pg (ref 26.0–34.0)
MCHC: 32.4 g/dL (ref 30.0–36.0)
MCV: 86.6 fL (ref 80.0–100.0)
Monocytes Absolute: 0.9 10*3/uL (ref 0.1–1.0)
Monocytes Relative: 8 %
NEUTROS ABS: 10.4 10*3/uL — AB (ref 1.7–7.7)
Neutrophils Relative %: 83 %
Platelets: 195 10*3/uL (ref 150–400)
RBC: 4.78 MIL/uL (ref 4.22–5.81)
RDW: 14.2 % (ref 11.5–15.5)
WBC: 12.4 10*3/uL — ABNORMAL HIGH (ref 4.0–10.5)
nRBC: 0 % (ref 0.0–0.2)

## 2018-12-27 MED ORDER — BENZONATATE 100 MG PO CAPS: 100.0000 mg | ORAL_CAPSULE | Freq: Three times a day (TID) | ORAL | 0 refills | Status: DC

## 2018-12-27 NOTE — ED Triage Notes (Signed)
Pt presents to ED with complaints of body aches, fever, chills, and cough which started on Monday. Pt has not had any med for fever today.

## 2018-12-27 NOTE — ED Notes (Signed)
Patient verbalizes understanding of discharge instructions, prescriptions, and follow up care if needed. Patient ambulatory out of department at this time.

## 2018-12-27 NOTE — ED Notes (Signed)
Patient c/o fever and weakness X4 days. Denies productive cough. [id number]

## 2018-12-27 NOTE — ED Provider Notes (Signed)
[facility] EMERGENCY DEPARTMENT Provider Note   CSN: [id number] Arrival date & time: 12/27/18  1656    History   Chief Complaint Chief Complaint  Patient presents with  . Flu like symptoms    HPI Albert [name] is a 58 y.o. male.     Patient presents to ED with fever, chills, cough, body aches that began on Monday. He was feeling better on Wednesday, but awoke today with return of chills and fever. No abdominal pain, nausea, vomiting, diarrhea. No recent travel or sick contacts. He had his flu shot this season.  The history is provided by the patient. No language interpreter was used.  URI  Presenting symptoms: cough and rhinorrhea   Severity:  Mild Duration:  4 days Chronicity:  New Associated symptoms: myalgias   Risk factors: chronic cardiac disease   Risk factors: no recent travel and no sick contacts     Past Medical History:  Diagnosis Date  . Allergic rhinitis   . Anemia    takes supplement  . Chronic cough   . Chronic sinusitis   . Complication of anesthesia    couldn't swallow and talk  . Coronary artery disease   . CVA (cerebral infarction) 2011   R hand deficit  . Diabetes mellitus without complication ([place]) ~2505   last HbA1c ~9 .... fasting 160s  . GERD (gastroesophageal reflux disease)   . Heart murmur   . Hypercholesteremia   . Hypertension   . Myocardial infarction ([place]) 2014  . Osteomyelitis ([place])    first and third metatarsal  . Pneumonia    hx of walking  . Splenic infarct    setting of lupus anticoagulant  . Stroke [facility]) 2011   right arm weakness    Patient Active Problem List   Diagnosis Date Noted  . Sebaceous cyst   . Amputated below knee, right ([place]) 10/19/2017  . History of transmetatarsal amputation of left foot ([place]) 06/12/2017  . Special screening for malignant neoplasms, colon   . Midfoot ulcer, left, limited to breakdown of skin ([place]) 11/18/2016  . ARF (acute renal failure) ([place]) 02/08/2016  . Chronic combined  systolic and diastolic CHF (congestive heart failure) ([place]) 02/08/2016  . Splenic infarct 01/06/2014  . Acute systolic CHF (congestive heart failure) ([place]) 08/03/2013  . Hx of right BKA ([place]) 07/26/2013  . Anemia 07/15/2013  . Hyperkalemia 07/15/2013  . Hyposmolality and/or hyponatremia 07/15/2013  . Acute respiratory failure ([place]) 07/15/2013  . Mitral regurgitation 07/15/2013  . Oliguria 07/15/2013  . Bacteremia due to Staphylococcus aureus 07/15/2013  . Hyperchloremia 07/15/2013  . Acute kidney failure ([place]) 07/14/2013  . Hypotension, unspecified 07/14/2013  . Acute systolic heart failure ([place]) 07/12/2013  . Acute pulmonary edema ([place]) 07/11/2013  . Pneumonia, organism unspecified(486) 07/11/2013  . Hypoxemia 07/11/2013  . Cardiomyopathy, ischemic 07/03/2013  . Coronary atherosclerosis of native coronary artery 07/01/2013  . NSTEMI (non-ST elevated myocardial infarction) ([place]) 06/26/2013  . AKI (acute kidney injury) ([place]) 06/25/2013  . Shock circulatory ([place]) 06/25/2013  . Dehydration with hyponatremia 06/25/2013  . Leukocytosis, unspecified 06/25/2013  . Sepsis(995.91) 06/25/2013  . Aftercare following surgery of the circulatory system, [place] 02/11/2013  . Peripheral vascular disease, unspecified ([place]) 12/31/2012  . Diabetes mellitus out of control ([place]) 12/17/2012  . Essential hypertension, benign 12/17/2012  . Atherosclerotic peripheral vascular disease ([place]) 12/17/2012  . Hypercholesteremia     Past Surgical History:  Procedure Laterality Date  . 4th Toe Amputation Left Jan. 2014   4th toe  in [place]  . AMPUTATION Left 12/08/2012   Procedure: AMPUTATION DIGIT;  Surgeon: Mcarthur Rossetti, MD;  Location: [place];  Service: Orthopedics;  Laterality: Left;  3rd toe amputation possible 5th toe amputation  . AMPUTATION Left 12/11/2012   Procedure: Amputation of 2nd Toe;  Surgeon: Mcarthur Rossetti, MD;  Location: [place];  Service: Orthopedics;  Laterality: Left;  .  AMPUTATION Right 06/29/2013   Procedure: AMPUTATION BELOW KNEE;  Surgeon: Newt Minion, MD;  Location: [place];  Service: Orthopedics;  Laterality: Right;  . AMPUTATION Left 08/29/2014   Procedure: Left Great Toe Amputation at MTP;  Surgeon: Newt Minion, MD;  Location: [place];  Service: Orthopedics;  Laterality: Left;  . AMPUTATION Left 10/31/2014   Procedure: Midfoot Amputation;  Surgeon: Newt Minion, MD;  Location: [place];  Service: Orthopedics;  Laterality: Left;  . BYPASS GRAFT POPLITEAL TO TIBIAL Left 12/13/2012   Procedure: BYPASS GRAFT POPLITEAL TO TIBIAL;  Surgeon: Serafina Mitchell, MD;  Location: [facility];  Service: Vascular;  Laterality: Left;  Left Popliteal to Posterior Tibial Bypass Graft with reversed saphenous vein graft  . CARDIAC CATHETERIZATION    . COLONOSCOPY [name] 04/14/2017   Procedure: COLONOSCOPY;  Surgeon: Danie Binder, MD;  [facility];  Service: Endoscopy;  Laterality: [name];  1000  . I&D EXTREMITY Left 12/08/2012   Procedure: IRRIGATION AND DEBRIDEMENT EXTREMITY;  Surgeon: Mcarthur Rossetti, MD;  Location: [place];  Service: Orthopedics;  Laterality: Left;  . I&D EXTREMITY Left 12/11/2012   Procedure: REPEAT I&D LEFT FOOT;  Surgeon: Mcarthur Rossetti, MD;  Location: [place];  Service: Orthopedics;  Laterality: Left;  . INCISION AND DRAINAGE Right 06/25/2013   Procedure: INCISION AND DRAINAGE RIGHT FOOT;  Surgeon: Mcarthur Rossetti, MD;  Location: [facility];  Service: Orthopedics;  Laterality: Right;  . LEFT HEART CATHETERIZATION WITH CORONARY ANGIOGRAM [name] 06/28/2013   Procedure: LEFT HEART CATHETERIZATION WITH CORONARY ANGIOGRAM;  Surgeon: Burnell Blanks, MD;  Location: [facility] CATH LAB;  Service: Cardiovascular;  Laterality: [name];  . MASS EXCISION [name] 11/15/2017   Procedure: EXCISION 4CM CYST ON NECK;  Surgeon: Virl Cagey, MD;  Location: [facility];  Service: General;  Laterality: [name];  . PENILE PROSTHESIS IMPLANT    . POLYPECTOMY  04/14/2017    Procedure: POLYPECTOMY;  Surgeon: Danie Binder, MD;  [facility];  Service: Endoscopy;;  colon  . STUMP REVISION Right 08/16/2013   Procedure: STUMP REVISION- right Below Knee Amputation;  Surgeon: Newt Minion, MD;  Location: [place];  Service: Orthopedics;  Laterality: Right;  . TEE WITHOUT CARDIOVERSION [name] 07/03/2013   Procedure: TRANSESOPHAGEAL ECHOCARDIOGRAM (TEE);  Surgeon: Larey Dresser, MD;  Location: [place];  Service: Cardiovascular;  Laterality: [name];  . TONSILLECTOMY AND ADENOIDECTOMY          Home Medications    Prior to Admission medications   Medication Sig Start Date End Date Taking? Authorizing Provider  aspirin EC 81 MG tablet Take 1 tablet (81 mg total) by mouth daily. 01/09/14   Kathie Dike, MD  atorvastatin (LIPITOR) 80 MG tablet Take 80 mg by mouth daily.    [provider]  azithromycin (ZITHROMAX) 250 MG tablet 1 po daily with food Patient not taking: Reported on 10/17/2018 09/14/18   Lily Kocher, PA-C  carvedilol (COREG) 25 MG tablet Take 25 mg by mouth 2 (two) times daily.     [provider]  furosemide (LASIX) 80 MG tablet  Take 80 mg by mouth daily. May take additional 40 mg as needed for weight gain 08/08/13   Angiulli, Lavon Paganini, PA-C  HUMALOG KWIKPEN 100 UNIT/ML KiwkPen Inject 24 Units into the skin 3 (three) times daily before meals.  07/06/15   [provider]  hydrALAZINE (APRESOLINE) 10 MG tablet Take 1 tablet (10 mg total) by mouth 3 (three) times daily. 10/01/13   Burtis Junes, NP  HYDROcodone-homatropine (HYCODAN) 5-1.5 MG/5ML syrup Take 5 mLs by mouth every 6 (six) hours as needed. Patient not taking: Reported on 10/17/2018 09/14/18   Lily Kocher, PA-C  loratadine-pseudoephedrine (CLARITIN-D 12 HOUR) 5-120 MG tablet Take 1 tablet by mouth 2 (two) times daily. Patient not taking: Reported on 10/17/2018 09/14/18   Lily Kocher, PA-C  losartan (COZAAR) 50 MG tablet TAKE 1 TABLET BY MOUTH  DAILY  12/03/18   Arnoldo Lenis, MD  mupirocin ointment (BACTROBAN) 2 % Apply 1 application topically daily. Patient taking differently: Apply 1 application topically once a week. Apply to left foot 03/22/17   Suzan Slick, NP  naproxen sodium (ANAPROX) 220 MG tablet Take 220 mg by mouth daily as needed (pain).    [provider]  nitroGLYCERIN (NITRODUR - DOSED IN MG/24 HR) 0.2 mg/hr patch Place 0.2 mg onto the skin once a week. Applies to foot/ankle area to increase blood flow 12/23/14   [provider]  oseltamivir (TAMIFLU) 75 MG capsule Take 1 capsule (75 mg total) by mouth every 12 (twelve) hours. 10/17/18   Nuala Alpha A, PA-C  pantoprazole (PROTONIX) 40 MG tablet Take 1 tablet (40 mg total) by mouth daily. 08/08/13   Angiulli, Lavon Paganini, PA-C  potassium chloride (K-DUR,KLOR-CON) 10 MEQ tablet Take 1 tablet (10 mEq total) by mouth daily. 04/08/15   Arnoldo Lenis, MD  senna-docusate (SENOKOT-S) 8.6-50 MG per tablet Take 1 tablet by mouth daily as needed for mild constipation.  08/08/13   Angiulli, Lavon Paganini, PA-C  TRESIBA FLEXTOUCH 100 UNIT/ML SOPN FlexTouch Pen Inject 57 Units into the skin daily. 07/23/18   [provider]  TRUVADA 200-300 MG tablet Take 1 tablet by mouth daily. 07/20/18   [provider]    Family History Family History  Problem Relation Age of Onset  . Pancreatic cancer Mother   . Diabetes Mother   . Hyperlipidemia Mother   . Diabetes Other   . Breast cancer Sister   . Depression Maternal Grandmother   . Heart disease Maternal Grandmother   . Depression Maternal Grandfather   . Heart disease Maternal Grandfather   . Depression Paternal Grandmother   . Heart disease Paternal Grandmother   . Depression Paternal Grandfather   . Heart disease Paternal Grandfather     Social History Social History   Tobacco Use  . Smoking status: Never Smoker  . Smokeless tobacco: Never Used  Substance Use Topics  . Alcohol use: No     Alcohol/week: 0.0 standard drinks  . Drug use: No     Allergies   Amoxicillin   Review of Systems Review of Systems  HENT: Positive for rhinorrhea.   Respiratory: Positive for cough.   Musculoskeletal: Positive for myalgias.  All other systems reviewed and are negative.    Physical Exam Updated Vital Signs BP 101/67 (BP Location: Left Arm)   Pulse (!) 111   Temp (!) 102.7 F (39.3 C) (Oral)   Resp 18   Ht 5\' 11"  (1.803 m)   Wt 118.8 kg   SpO2 98%  BMI 36.54 kg/m   Physical Exam Vitals signs and nursing note reviewed.  Constitutional:      Appearance: Normal appearance. He is not ill-appearing.  HENT:     Head: Normocephalic.     Right Ear: Tympanic membrane normal.     Left Ear: Tympanic membrane normal.     Nose: Rhinorrhea present.     Mouth/Throat:     Mouth: Mucous membranes are moist.     Pharynx: Oropharynx is clear.  Eyes:     Conjunctiva/sclera: Conjunctivae normal.  Cardiovascular:     Rate and Rhythm: Regular rhythm. Tachycardia present.  Pulmonary:     Effort: Pulmonary effort is normal.  Abdominal:     General: Bowel sounds are normal.     Palpations: Abdomen is soft.  Lymphadenopathy:     Cervical: No cervical adenopathy.  Skin:    General: Skin is warm and dry.     Findings: No rash.  Neurological:     Mental Status: He is alert and oriented to person, place, and time.  Psychiatric:        Mood and Affect: Mood normal.      ED Treatments / Results  Labs (all labs ordered are listed, but only abnormal results are displayed) Labs Reviewed  CBC WITH DIFFERENTIAL/PLATELET - Abnormal; Notable for the following components:      Result Value   WBC 12.4 (*)    Neutro Abs 10.4 (*)    Abs Immature Granulocytes 0.10 (*)    All other components within normal limits  CBC WITH DIFFERENTIAL/PLATELET   Mild elevation in white count.  EKG None  Radiology Dg Chest 2 View  Result Date: 12/27/2018 CLINICAL DATA:  Fever and cough EXAM:  CHEST - 2 VIEW COMPARISON:  10/17/2018 FINDINGS: The heart size and mediastinal contours are within normal limits. Both lungs are clear. The visualized skeletal structures are unremarkable. IMPRESSION: No active cardiopulmonary disease. Electronically Signed   By: Ulyses Jarred M.D.   On: 12/27/2018 19:18    Procedures Procedures (including critical care time)  Medications Ordered in ED Medications - No data to display   Initial Impression / Assessment and Plan / ED Course  I have reviewed the triage vital signs and the nursing notes.  Pertinent labs & imaging results that were available during my care of the patient were reviewed by me and considered in my medical decision making (see chart for details).        Pt symptoms consistent with URI, likely viral. CXR negative for acute infiltrate. Pt will be discharged with symptomatic treatment.  Discussed return precautions.  Pt is hemodynamically stable & in NAD prior to discharge.  Final Clinical Impressions(s) / ED Diagnoses   Final diagnoses:  Upper respiratory tract infection, unspecified type    ED Discharge Orders         Ordered    benzonatate (TESSALON) 100 MG capsule  Every 8 hours     12/27/18 2041           Etta Quill, NP 12/27/18 2049    Francine Graven, DO 12/28/18 2316

## 2018-12-29 ENCOUNTER — Emergency Department (HOSPITAL_COMMUNITY): Payer: Medicare Other

## 2018-12-29 ENCOUNTER — Encounter (HOSPITAL_COMMUNITY): Payer: Self-pay | Admitting: Emergency Medicine

## 2018-12-29 ENCOUNTER — Other Ambulatory Visit: Payer: Self-pay

## 2018-12-29 ENCOUNTER — Inpatient Hospital Stay (HOSPITAL_COMMUNITY)
Admission: EM | Admit: 2018-12-29 | Discharge: 2019-01-01 | DRG: 603 | Disposition: A | Discharge status: Home / Self Care | Payer: Medicare Other | Attending: Internal Medicine | Admitting: Internal Medicine

## 2018-12-29 DIAGNOSIS — Z794 Long term (current) use of insulin: Secondary | ICD-10-CM

## 2018-12-29 DIAGNOSIS — E785 Hyperlipidemia, unspecified: Secondary | ICD-10-CM | POA: Diagnosis present

## 2018-12-29 DIAGNOSIS — L03116 Cellulitis of left lower limb: Secondary | ICD-10-CM | POA: Diagnosis not present

## 2018-12-29 DIAGNOSIS — IMO0002 Reserved for concepts with insufficient information to code with codable children: Secondary | ICD-10-CM | POA: Diagnosis present

## 2018-12-29 DIAGNOSIS — I2510 Atherosclerotic heart disease of native coronary artery without angina pectoris: Secondary | ICD-10-CM | POA: Diagnosis present

## 2018-12-29 DIAGNOSIS — E1151 Type 2 diabetes mellitus with diabetic peripheral angiopathy without gangrene: Secondary | ICD-10-CM | POA: Diagnosis present

## 2018-12-29 DIAGNOSIS — Z8673 Personal history of transient ischemic attack (TIA), and cerebral infarction without residual deficits: Secondary | ICD-10-CM | POA: Diagnosis not present

## 2018-12-29 DIAGNOSIS — I252 Old myocardial infarction: Secondary | ICD-10-CM | POA: Diagnosis not present

## 2018-12-29 DIAGNOSIS — Z800 Family history of malignant neoplasm of digestive organs: Secondary | ICD-10-CM | POA: Diagnosis not present

## 2018-12-29 DIAGNOSIS — E1165 Type 2 diabetes mellitus with hyperglycemia: Secondary | ICD-10-CM | POA: Diagnosis present

## 2018-12-29 DIAGNOSIS — Z7982 Long term (current) use of aspirin: Secondary | ICD-10-CM

## 2018-12-29 DIAGNOSIS — I129 Hypertensive chronic kidney disease with stage 1 through stage 4 chronic kidney disease, or unspecified chronic kidney disease: Secondary | ICD-10-CM | POA: Diagnosis present

## 2018-12-29 DIAGNOSIS — E1122 Type 2 diabetes mellitus with diabetic chronic kidney disease: Secondary | ICD-10-CM | POA: Diagnosis present

## 2018-12-29 DIAGNOSIS — Z89511 Acquired absence of right leg below knee: Secondary | ICD-10-CM

## 2018-12-29 DIAGNOSIS — I70209 Unspecified atherosclerosis of native arteries of extremities, unspecified extremity: Secondary | ICD-10-CM | POA: Diagnosis present

## 2018-12-29 DIAGNOSIS — Z833 Family history of diabetes mellitus: Secondary | ICD-10-CM | POA: Diagnosis not present

## 2018-12-29 DIAGNOSIS — I6529 Occlusion and stenosis of unspecified carotid artery: Secondary | ICD-10-CM | POA: Diagnosis present

## 2018-12-29 DIAGNOSIS — Z8349 Family history of other endocrine, nutritional and metabolic diseases: Secondary | ICD-10-CM

## 2018-12-29 DIAGNOSIS — I10 Essential (primary) hypertension: Secondary | ICD-10-CM | POA: Diagnosis not present

## 2018-12-29 DIAGNOSIS — S91302A Unspecified open wound, left foot, initial encounter: Secondary | ICD-10-CM | POA: Diagnosis not present

## 2018-12-29 DIAGNOSIS — Z89422 Acquired absence of other left toe(s): Secondary | ICD-10-CM | POA: Diagnosis not present

## 2018-12-29 DIAGNOSIS — L03119 Cellulitis of unspecified part of limb: Secondary | ICD-10-CM | POA: Diagnosis not present

## 2018-12-29 DIAGNOSIS — Z803 Family history of malignant neoplasm of breast: Secondary | ICD-10-CM

## 2018-12-29 DIAGNOSIS — E1369 Other specified diabetes mellitus with other specified complication: Secondary | ICD-10-CM | POA: Diagnosis present

## 2018-12-29 DIAGNOSIS — M7740 Metatarsalgia, unspecified foot: Secondary | ICD-10-CM | POA: Diagnosis not present

## 2018-12-29 DIAGNOSIS — N183 Chronic kidney disease, stage 3 (moderate): Secondary | ICD-10-CM | POA: Diagnosis present

## 2018-12-29 DIAGNOSIS — E11628 Type 2 diabetes mellitus with other skin complications: Secondary | ICD-10-CM | POA: Diagnosis not present

## 2018-12-29 DIAGNOSIS — I70202 Unspecified atherosclerosis of native arteries of extremities, left leg: Secondary | ICD-10-CM | POA: Diagnosis not present

## 2018-12-29 DIAGNOSIS — D72829 Elevated white blood cell count, unspecified: Secondary | ICD-10-CM | POA: Diagnosis present

## 2018-12-29 LAB — CBC
HCT: 40.8 % (ref 39.0–52.0)
Hemoglobin: 12.7 g/dL — ABNORMAL LOW (ref 13.0–17.0)
MCH: 27.1 pg (ref 26.0–34.0)
MCHC: 31.1 g/dL (ref 30.0–36.0)
MCV: 87.2 fL (ref 80.0–100.0)
Platelets: 205 10*3/uL (ref 150–400)
RBC: 4.68 MIL/uL (ref 4.22–5.81)
RDW: 14.2 % (ref 11.5–15.5)
WBC: 12.3 10*3/uL — ABNORMAL HIGH (ref 4.0–10.5)
nRBC: 0 % (ref 0.0–0.2)

## 2018-12-29 LAB — BASIC METABOLIC PANEL
Anion gap: 11 (ref 5–15)
BUN: 33 mg/dL — ABNORMAL HIGH (ref 6–20)
CO2: 24 mmol/L (ref 22–32)
Calcium: 7.9 mg/dL — ABNORMAL LOW (ref 8.9–10.3)
Chloride: 101 mmol/L (ref 98–111)
Creatinine, Ser: 1.44 mg/dL — ABNORMAL HIGH (ref 0.61–1.24)
GFR calc Af Amer: >60 mL/min (ref >60)
GFR calc non Af Amer: 54 mL/min — ABNORMAL LOW (ref >60)
Glucose, Bld: 192 mg/dL — ABNORMAL HIGH (ref 70–99)
Potassium: 3.9 mmol/L (ref 3.5–5.1)
Sodium: 136 mmol/L (ref 135–145)

## 2018-12-29 LAB — GLUCOSE, CAPILLARY: Glucose-Capillary: 275 mg/dL — ABNORMAL HIGH (ref 70–99)

## 2018-12-29 LAB — CBG MONITORING, ED: Glucose-Capillary: 171 mg/dL — ABNORMAL HIGH (ref 70–99)

## 2018-12-29 MED ORDER — SODIUM CHLORIDE 0.9% FLUSH
3.0000 mL | Freq: Two times a day (BID) | INTRAVENOUS | Status: DC
  Administered 2018-12-30 – 2019-01-01 (×5): 3 mL via INTRAVENOUS

## 2018-12-29 MED ORDER — HYDRALAZINE HCL 10 MG PO TABS
10.0000 mg | ORAL_TABLET | Freq: Three times a day (TID) | ORAL | Status: DC
  Administered 2018-12-29 – 2019-01-01 (×6): 10 mg via ORAL
  Filled 2018-12-29 (×7): qty 1

## 2018-12-29 MED ORDER — SULFAMETHOXAZOLE-TRIMETHOPRIM 800-160 MG PO TABS
1.0000 | ORAL_TABLET | Freq: Once | ORAL | Status: AC
  Administered 2018-12-29: 1 via ORAL
  Filled 2018-12-29: qty 1

## 2018-12-29 MED ORDER — MUPIROCIN 2 % EX OINT
1.0000 "application " | TOPICAL_OINTMENT | CUTANEOUS | Status: DC
  Administered 2018-12-30 – 2018-12-31 (×2): 1 via TOPICAL
  Filled 2018-12-29 (×2): qty 22

## 2018-12-29 MED ORDER — LOSARTAN POTASSIUM 50 MG PO TABS
50.0000 mg | ORAL_TABLET | Freq: Every day | ORAL | Status: DC
  Administered 2018-12-30 – 2019-01-01 (×2): 50 mg via ORAL
  Filled 2018-12-29 (×3): qty 1

## 2018-12-29 MED ORDER — INSULIN GLARGINE 100 UNIT/ML ~~LOC~~ SOLN
57.0000 [IU] | Freq: Every day | SUBCUTANEOUS | Status: DC
  Administered 2018-12-30: 57 [IU] via SUBCUTANEOUS
  Filled 2018-12-29 (×2): qty 0.57

## 2018-12-29 MED ORDER — INSULIN ASPART 100 UNIT/ML ~~LOC~~ SOLN
0.0000 [IU] | Freq: Three times a day (TID) | SUBCUTANEOUS | Status: DC
  Administered 2018-12-30 – 2018-12-31 (×3): 2 [IU] via SUBCUTANEOUS
  Administered 2018-12-31: 5 [IU] via SUBCUTANEOUS
  Administered 2019-01-01: 2 [IU] via SUBCUTANEOUS

## 2018-12-29 MED ORDER — CEPHALEXIN 500 MG PO CAPS
500.0000 mg | ORAL_CAPSULE | Freq: Once | ORAL | Status: AC
  Administered 2018-12-29: 500 mg via ORAL
  Filled 2018-12-29: qty 1

## 2018-12-29 MED ORDER — PANTOPRAZOLE SODIUM 40 MG PO TBEC
40.0000 mg | DELAYED_RELEASE_TABLET | Freq: Every day | ORAL | Status: DC
  Administered 2018-12-30 – 2019-01-01 (×3): 40 mg via ORAL
  Filled 2018-12-29 (×3): qty 1

## 2018-12-29 MED ORDER — SENNOSIDES-DOCUSATE SODIUM 8.6-50 MG PO TABS: 1.0000 | ORAL_TABLET | Freq: Every day | ORAL | Status: DC | PRN

## 2018-12-29 MED ORDER — EMTRICITABINE-TENOFOVIR AF 200-25 MG PO TABS
1.0000 | ORAL_TABLET | Freq: Every day | ORAL | Status: DC
  Administered 2018-12-30 – 2019-01-01 (×3): 1 via ORAL
  Filled 2018-12-29 (×4): qty 1

## 2018-12-29 MED ORDER — CARVEDILOL 12.5 MG PO TABS
25.0000 mg | ORAL_TABLET | Freq: Two times a day (BID) | ORAL | Status: DC
  Administered 2018-12-30 – 2019-01-01 (×5): 25 mg via ORAL
  Filled 2018-12-29 (×5): qty 2

## 2018-12-29 MED ORDER — SODIUM CHLORIDE 0.9% FLUSH: 3.0000 mL | INTRAVENOUS | Status: DC | PRN

## 2018-12-29 MED ORDER — FUROSEMIDE 80 MG PO TABS
80.0000 mg | ORAL_TABLET | Freq: Every day | ORAL | Status: DC
  Administered 2018-12-30 – 2019-01-01 (×3): 80 mg via ORAL
  Filled 2018-12-29 (×3): qty 1

## 2018-12-29 MED ORDER — INSULIN ASPART 100 UNIT/ML ~~LOC~~ SOLN
24.0000 [IU] | Freq: Three times a day (TID) | SUBCUTANEOUS | Status: DC
  Administered 2018-12-30 (×3): 24 [IU] via SUBCUTANEOUS

## 2018-12-29 MED ORDER — SODIUM CHLORIDE 0.9 % IV SOLN: 250.0000 mL | INTRAVENOUS | Status: DC | PRN

## 2018-12-29 MED ORDER — OSELTAMIVIR PHOSPHATE 75 MG PO CAPS
75.0000 mg | ORAL_CAPSULE | Freq: Two times a day (BID) | ORAL | Status: DC
  Administered 2018-12-29 – 2019-01-01 (×6): 75 mg via ORAL
  Filled 2018-12-29 (×6): qty 1

## 2018-12-29 MED ORDER — ASPIRIN EC 81 MG PO TBEC
81.0000 mg | DELAYED_RELEASE_TABLET | Freq: Every day | ORAL | Status: DC
  Administered 2018-12-30 – 2019-01-01 (×3): 81 mg via ORAL
  Filled 2018-12-29 (×3): qty 1

## 2018-12-29 MED ORDER — POTASSIUM CHLORIDE CRYS ER 10 MEQ PO TBCR
10.0000 meq | EXTENDED_RELEASE_TABLET | Freq: Every day | ORAL | Status: DC
  Administered 2018-12-30 – 2019-01-01 (×3): 10 meq via ORAL
  Filled 2018-12-29 (×3): qty 1

## 2018-12-29 MED ORDER — ATORVASTATIN CALCIUM 40 MG PO TABS
80.0000 mg | ORAL_TABLET | Freq: Every day | ORAL | Status: DC
  Administered 2018-12-30 – 2018-12-31 (×2): 80 mg via ORAL
  Filled 2018-12-29 (×2): qty 2

## 2018-12-29 MED ORDER — CEFAZOLIN SODIUM-DEXTROSE 2-4 GM/100ML-% IV SOLN
2.0000 g | Freq: Three times a day (TID) | INTRAVENOUS | Status: AC
  Administered 2018-12-29 – 2019-01-01 (×9): 2 g via INTRAVENOUS
  Filled 2018-12-29 (×9): qty 100

## 2018-12-29 MED ORDER — ENOXAPARIN SODIUM 60 MG/0.6ML ~~LOC~~ SOLN
60.0000 mg | SUBCUTANEOUS | Status: DC
  Administered 2018-12-29: 60 mg via SUBCUTANEOUS
  Filled 2018-12-29: qty 0.6

## 2018-12-29 MED ORDER — BENZONATATE 100 MG PO CAPS
100.0000 mg | ORAL_CAPSULE | Freq: Three times a day (TID) | ORAL | Status: DC
  Administered 2018-12-29 – 2019-01-01 (×8): 100 mg via ORAL
  Filled 2018-12-29 (×9): qty 1

## 2018-12-29 MED ORDER — NITROGLYCERIN 0.2 MG/HR TD PT24
0.2000 mg | MEDICATED_PATCH | TRANSDERMAL | Status: DC
  Filled 2018-12-29: qty 1

## 2018-12-29 NOTE — ED Notes (Signed)
Patient transported to X-ray 

## 2018-12-29 NOTE — H&P (Addendum)
History and Physical  Albert [name]:[id number] DOB: Feb 07, 1961 DOA: 12/29/2018  Referring physician: Dr. Debby Bud PCP: Celene Squibb, MD  Outpatient Specialists: None Patient coming from: Home & is able to ambulate yes  Chief Complaint: Redness and swelling of his left lower extremity x1 day  HPI: Albert [name] is a 58 y.o. male with medical history significant for type 2 diabetes mellitus, MI, multiple osteomyelitis with multiple amputation, nonhemorrhagic CVA, coronary artery disease GERD hypertension hyperlipidemia who presented to the emergency room today for evaluation redness of his left leg with pain and swelling which he noted today while he was vacuuming.  He stated that he he has not had this kind of redness in his leg for over 3 years.  Prior to that he had flulike symptoms and was treatedas a flu with Tamiflu he said that his flu symptoms are improved still has a little cough.  He was going to be treated as outpatient with oral antibiotics but due to his extensive history of cellulitis osteomyelitis and amputations he did not feel comfortable going home.  So he is being admitted for IV antibiotics   ED Course: Patient was attempted to be treated as outpatient with oral antibiotics but he was very nervous because of his extensive history.  Review of Systems: Pt complains of he had fever earlier around Thursday which he thought may have been due to the flu he said he was aching felt like he had been run by a truck  Pt denies any chest pain or shortness of breath diarrhea or abdominal pain.  Review of systems are otherwise negative   Past Medical History:  Diagnosis Date  . Allergic rhinitis   . Anemia    takes supplement  . Chronic cough   . Chronic sinusitis   . Complication of anesthesia    couldn't swallow and talk  . Coronary artery disease   . CVA (cerebral infarction) 2011   R hand deficit  . Diabetes mellitus without complication ([place]) ~8119   last  HbA1c ~9 .... fasting 160s  . GERD (gastroesophageal reflux disease)   . Heart murmur   . Hypercholesteremia   . Hypertension   . Myocardial infarction ([place]) 2014  . Osteomyelitis ([place])    first and third metatarsal  . Pneumonia    hx of walking  . Splenic infarct    setting of lupus anticoagulant  . Stroke [facility]) 2011   right arm weakness   Past Surgical History:  Procedure Laterality Date  . 4th Toe Amputation Left Jan. 2014   4th toe in [place]  . AMPUTATION Left 12/08/2012   Procedure: AMPUTATION DIGIT;  Surgeon: Mcarthur Rossetti, MD;  Location: [place];  Service: Orthopedics;  Laterality: Left;  3rd toe amputation possible 5th toe amputation  . AMPUTATION Left 12/11/2012   Procedure: Amputation of 2nd Toe;  Surgeon: Mcarthur Rossetti, MD;  Location: [place];  Service: Orthopedics;  Laterality: Left;  . AMPUTATION Right 06/29/2013   Procedure: AMPUTATION BELOW KNEE;  Surgeon: Newt Minion, MD;  Location: [place];  Service: Orthopedics;  Laterality: Right;  . AMPUTATION Left 08/29/2014   Procedure: Left Great Toe Amputation at MTP;  Surgeon: Newt Minion, MD;  Location: [place];  Service: Orthopedics;  Laterality: Left;  . AMPUTATION Left 10/31/2014   Procedure: Midfoot Amputation;  Surgeon: Newt Minion, MD;  Location: [place];  Service: Orthopedics;  Laterality: Left;  . BYPASS GRAFT POPLITEAL TO TIBIAL Left 12/13/2012  Procedure: BYPASS GRAFT POPLITEAL TO TIBIAL;  Surgeon: Serafina Mitchell, MD;  [facility];  Service: Vascular;  Laterality: Left;  Left Popliteal to Posterior Tibial Bypass Graft with reversed saphenous vein graft  . CARDIAC CATHETERIZATION    . COLONOSCOPY [name] 04/14/2017   Procedure: COLONOSCOPY;  Surgeon: Danie Binder, MD;  [facility];  Service: Endoscopy;  Laterality: [name];  1000  . I&D EXTREMITY Left 12/08/2012   Procedure: IRRIGATION AND DEBRIDEMENT EXTREMITY;  Surgeon: Mcarthur Rossetti, MD;  Location: [place];  Service: Orthopedics;   Laterality: Left;  . I&D EXTREMITY Left 12/11/2012   Procedure: REPEAT I&D LEFT FOOT;  Surgeon: Mcarthur Rossetti, MD;  Location: [place];  Service: Orthopedics;  Laterality: Left;  . INCISION AND DRAINAGE Right 06/25/2013   Procedure: INCISION AND DRAINAGE RIGHT FOOT;  Surgeon: Mcarthur Rossetti, MD;  Location: [facility];  Service: Orthopedics;  Laterality: Right;  . LEFT HEART CATHETERIZATION WITH CORONARY ANGIOGRAM [name] 06/28/2013   Procedure: LEFT HEART CATHETERIZATION WITH CORONARY ANGIOGRAM;  Surgeon: Burnell Blanks, MD;  Location: [facility] CATH LAB;  Service: Cardiovascular;  Laterality: [name];  . MASS EXCISION [name] 11/15/2017   Procedure: EXCISION 4CM CYST ON NECK;  Surgeon: Virl Cagey, MD;  Location: [facility];  Service: General;  Laterality: [name];  . PENILE PROSTHESIS IMPLANT    . POLYPECTOMY  04/14/2017   Procedure: POLYPECTOMY;  Surgeon: Danie Binder, MD;  [facility];  Service: Endoscopy;;  colon  . STUMP REVISION Right 08/16/2013   Procedure: STUMP REVISION- right Below Knee Amputation;  Surgeon: Newt Minion, MD;  Location: [place];  Service: Orthopedics;  Laterality: Right;  . TEE WITHOUT CARDIOVERSION [name] 07/03/2013   Procedure: TRANSESOPHAGEAL ECHOCARDIOGRAM (TEE);  Surgeon: Larey Dresser, MD;  Location: [place];  Service: Cardiovascular;  Laterality: [name];  . TONSILLECTOMY AND ADENOIDECTOMY      Social History:  reports that he has never smoked. He has never used smokeless tobacco. He reports that he does not drink alcohol or use drugs.   Allergies  Allergen Reactions  . Amoxicillin Nausea And Vomiting    Has patient had a PCN reaction causing immediate rash, facial/tongue/throat swelling, SOB or lightheadedness with hypotension: No Has patient had a PCN reaction causing severe rash involving mucus membranes or skin necrosis: No Has patient had a PCN reaction that required hospitalization: No Has patient had a PCN reaction occurring within the last  10 years: Yes If all of the above answers are "NO", then may proceed with Cephalosporin use.     Family History  Problem Relation Age of Onset  . Pancreatic cancer Mother   . Diabetes Mother   . Hyperlipidemia Mother   . Diabetes Other   . Breast cancer Sister   . Depression Maternal Grandmother   . Heart disease Maternal Grandmother   . Depression Maternal Grandfather   . Heart disease Maternal Grandfather   . Depression Paternal Grandmother   . Heart disease Paternal Grandmother   . Depression Paternal Grandfather   . Heart disease Paternal Grandfather       Prior to Admission medications   Medication Sig Start Date End Date Taking? Authorizing Provider  aspirin EC 81 MG tablet Take 1 tablet (81 mg total) by mouth daily. 01/09/14  Yes Kathie Dike, MD  atorvastatin (LIPITOR) 80 MG tablet Take 80 mg by mouth daily.   Yes [provider]  benzonatate (TESSALON) 100 MG capsule Take 1 capsule (100 mg  total) by mouth every 8 (eight) hours. 12/27/18  Yes Etta Quill, NP  carvedilol (COREG) 25 MG tablet Take 25 mg by mouth 2 (two) times daily.    Yes [provider]  furosemide (LASIX) 80 MG tablet Take 80 mg by mouth daily. May take additional 40 mg as needed for weight gain 08/08/13  Yes Angiulli, Lavon Paganini, PA-C  HUMALOG KWIKPEN 100 UNIT/ML KiwkPen Inject 24 Units into the skin 3 (three) times daily before meals.  07/06/15  Yes [provider]  hydrALAZINE (APRESOLINE) 10 MG tablet Take 1 tablet (10 mg total) by mouth 3 (three) times daily. 10/01/13  Yes Burtis Junes, NP  losartan (COZAAR) 50 MG tablet TAKE 1 TABLET BY MOUTH  DAILY 12/03/18  Yes Branch, Alphonse Guild, MD  mupirocin ointment (BACTROBAN) 2 % Apply 1 application topically daily. Patient taking differently: Apply 1 application topically once a week. Apply to left foot 03/22/17  Yes Dondra Prader R, NP  naproxen sodium (ANAPROX) 220 MG tablet Take 220 mg by mouth daily as needed (pain).   Yes  [provider]  nitroGLYCERIN (NITRODUR - DOSED IN MG/24 HR) 0.2 mg/hr patch Place 0.2 mg onto the skin once a week. Applies to foot/ankle area to increase blood flow 12/23/14  Yes [provider]  pantoprazole (PROTONIX) 40 MG tablet Take 1 tablet (40 mg total) by mouth daily. 08/08/13  Yes Angiulli, Lavon Paganini, PA-C  potassium chloride (K-DUR,KLOR-CON) 10 MEQ tablet Take 1 tablet (10 mEq total) by mouth daily. 04/08/15  Yes Branch, Alphonse Guild, MD  TRESIBA FLEXTOUCH 100 UNIT/ML SOPN FlexTouch Pen Inject 57 Units into the skin daily. 07/23/18  Yes [provider]  TRUVADA 200-300 MG tablet Take 1 tablet by mouth daily. 07/20/18  Yes [provider]  azithromycin (ZITHROMAX) 250 MG tablet 1 po daily with food Patient not taking: Reported on 10/17/2018 09/14/18   Lily Kocher, PA-C  HYDROcodone-homatropine [facility]) 5-1.5 MG/5ML syrup Take 5 mLs by mouth every 6 (six) hours as needed. Patient not taking: Reported on 10/17/2018 09/14/18   Lily Kocher, PA-C  oseltamivir (TAMIFLU) 75 MG capsule Take 1 capsule (75 mg total) by mouth every 12 (twelve) hours. Patient not taking: Reported on 12/29/2018 10/17/18   Nuala Alpha A, PA-C  senna-docusate (SENOKOT-S) 8.6-50 MG per tablet Take 1 tablet by mouth daily as needed for mild constipation.  08/08/13   Angiulli, Lavon Paganini, PA-C    Physical Exam: BP 134/81   Pulse 83   Temp 99 F (37.2 C) (Oral)   Resp 14   Ht 5\' 11"  (1.803 m)   Wt 118.8 kg   SpO2 99%   BMI 36.54 kg/m   Exam:  . General: 58 y.o. year-old male well developed well nourished in no acute distress.  Alert and oriented x3. . Cardiovascular: Regular rate and rhythm with no rubs or gallops.  No thyromegaly or JVD noted.   Marland Kitchen Respiratory: Clear to auscultation with no wheezes or rales. Good inspiratory effort. . Abdomen: Soft nontender nondistended with normal bowel sounds x4 quadrants. . Musculoskeletal: Left lower extremity trace edema.  Left forefoot  amputation, right below-knee amputation, prosthetic leg in place . Skin: Shallow ulcerative lesions noted on the left lower shin.  Extensive very erythematous and warmness noted in the left leg covering just above the ankle to just below the knee . Psychiatry: Mood is appropriate for condition and setting           Labs on Admission:  Basic  Metabolic Panel: Recent Labs  Lab 12/29/18 1136  NA 136  K 3.9  CL 101  CO2 24  GLUCOSE 192*  BUN 33*  CREATININE 1.44*  CALCIUM 7.9*   Liver Function Tests: No results for input(s): AST, ALT, ALKPHOS, BILITOT, PROT, ALBUMIN in the last 168 hours. No results for input(s): LIPASE, AMYLASE in the last 168 hours. No results for input(s): AMMONIA in the last 168 hours. CBC: Recent Labs  Lab 12/27/18 1958 12/29/18 1136  WBC 12.4* 12.3*  NEUTROABS 10.4*  --   HGB 13.4 12.7*  HCT 41.4 40.8  MCV 86.6 87.2  PLT 195 205   Cardiac Enzymes: No results for input(s): CKTOTAL, CKMB, CKMBINDEX, TROPONINI in the last 168 hours.  BNP (last 3 results) No results for input(s): BNP in the last 8760 hours.  ProBNP (last 3 results) No results for input(s): PROBNP in the last 8760 hours.  CBG: Recent Labs  Lab 12/29/18 1438  GLUCAP 171*    Radiological Exams on Admission: Dg Chest 2 View  Result Date: 12/27/2018 CLINICAL DATA:  Fever and cough EXAM: CHEST - 2 VIEW COMPARISON:  10/17/2018 FINDINGS: The heart size and mediastinal contours are within normal limits. Both lungs are clear. The visualized skeletal structures are unremarkable. IMPRESSION: No active cardiopulmonary disease. Electronically Signed   By: Ulyses Jarred M.D.   On: 12/27/2018 19:18   Dg Tibia/fibula Left  Result Date: 12/29/2018 CLINICAL DATA:  Redness in lower leg yesterday. History of cellulitis. EXAM: LEFT TIBIA AND FIBULA - 2 VIEW COMPARISON:  None. FINDINGS: Vascular calcifications. No bony erosions, fractures, or dislocations. IMPRESSION: Vascular calcifications. No  acute bony abnormalities. No soft tissue air noted. Electronically Signed   By: Dorise Bullion III M.D   On: 12/29/2018 13:09   Dg Foot Complete Left  Result Date: 12/29/2018 CLINICAL DATA:  Redness and lower leg yesterday. History of cellulitis. EXAM: LEFT FOOT - COMPLETE 3+ VIEW COMPARISON:  None. FINDINGS: Patient is status post amputation of the distal foot. The remaining first, third, fourth, and fifth metatarsals are not significantly changed. There is some mild increased irregularity in the distal remaining second metatarsal. There is also increased attenuation in the adjacent soft tissues, likely soft tissue calcification. I suspect these findings are chronic rather than acute osteomyelitis. MRI could better assess this region. There may be some distal soft tissue swelling. Definitive soft tissue gas. Vascular calcifications noted. IMPRESSION: 1. Mild increased irregularity of the remaining distal second metatarsal. I favor a chronic process with no definitive evidence of osteomyelitis. An MRI could better assess if clinical concern persists. Electronically Signed   By: Dorise Bullion III M.D   On: 12/29/2018 13:11    EKG: Independently reviewed.  None  Assessment/Plan Present on Admission: . Leukocytosis . Essential hypertension, benign . Coronary atherosclerosis of native coronary artery . Diabetes mellitus out of control ([place]) . Atherosclerotic peripheral vascular disease ([place]) . Cellulitis in diabetic foot (HCC)  Principal Problem:   Cellulitis in diabetic foot ([place]) Active Problems:   Diabetes mellitus out of control [facility])   Essential hypertension, benign   Atherosclerotic peripheral vascular disease (H[facility])   Leukocytosis   Coronary atherosclerosis of native coronary artery  1.  Cellulitis of the left lower extremity with possible life-threatening due to his extensive history.  He has been started on Ancef.  IV  2.  Diabetes mellitus, uncontrolled continue current  management PER home insulin  3.  Mild leukocytosis continue IV antibiotics  4.  Hypertension continue medication  per home  5.Coronary artery disease stable denies any chest pain continue  6.  Chronic kidney disease stage I patient is on p.o. Lasix his current creatinine is 1.44    Severity of Illness: The appropriate patient status for this patient is INPATIENT. Inpatient status is judged to be reasonable and necessary in order to provide the required intensity of service to ensure the patient's safety. The patient's presenting symptoms, physical exam findings, and initial radiographic and laboratory data in the context of their chronic comorbidities is felt to place them at high risk for further clinical deterioration. Furthermore, it is not anticipated that the patient will be medically stable for discharge from the hospital within 2 midnights of admission. The following factors support the patient status of inpatient.   " The patient's presenting symptoms include erythema and warmness with cellulitis of the left leg patient with extensive history of amputation and diabetic foot he has lost his right foot he has a below-knee amputation and has a recent  left forefoot amputation.  With history of diabetes he is at increased risk of losing the limb.  So he is being admitted inpatient for IV antibiotics " The worrisome physical exam findings include extreme redness and cellulitis and warmness of the remaining partially amputated left lower limb "  * I certify that at the point of admission it is my clinical judgment that the patient will require inpatient hospital care spanning beyond 2 midnights from the point of admission due to high intensity of service, high risk for further deterioration and high frequency of surveillance required.*    DVT prophylaxis: Lovenox  Code Status: Full  Family Communication: Wife Mechele Claude at bedside  Disposition Plan: Home when stable  Consults called: None   Admission status: INPatient    Cristal Deer MD [facility] Pager [phone]  If 7PM-7AM, please contact night-coverage [email or web address] Password [facility]  12/29/2018, 5:35 PM

## 2018-12-29 NOTE — ED Provider Notes (Signed)
[facility] Emergency Department Provider Note MRN:  [id number]  Arrival date & time: 12/29/18     Chief Complaint   Cellulitis   History of Present Illness   Albert [name] is a 58 y.o. year-old male with a history of diabetes, MI, osteomyelitis presenting to the ED with chief complaint of cellulitis.  Patient explains that 1 week ago he began having subjective fevers, sore throat, cough.  These symptoms have since resolved.  Noticed some discomfort in the left leg when he was vacuuming the house this morning, rolled up his pant leg and noticed that his leg was red and swollen.  Has had issues with leg infections in the past related to his diabetes, has had multiple amputations.  Denies continued fever, no chest pain or shortness of breath, no abdominal pain.  Pain in the leg is mild, constant, worse with motion.  Review of Systems  A complete 10 system review of systems was obtained and all systems are negative except as noted in the HPI and PMH.   Patient's Health History    Past Medical History:  Diagnosis Date  . Allergic rhinitis   . Anemia    takes supplement  . Chronic cough   . Chronic sinusitis   . Complication of anesthesia    couldn't swallow and talk  . Coronary artery disease   . CVA (cerebral infarction) 2011   R hand deficit  . Diabetes mellitus without complication ([place]) ~3329   last HbA1c ~9 .... fasting 160s  . GERD (gastroesophageal reflux disease)   . Heart murmur   . Hypercholesteremia   . Hypertension   . Myocardial infarction ([place]) 2014  . Osteomyelitis ([place])    first and third metatarsal  . Pneumonia    hx of walking  . Splenic infarct    setting of lupus anticoagulant  . Stroke [facility]) 2011   right arm weakness    Past Surgical History:  Procedure Laterality Date  . 4th Toe Amputation Left Jan. 2014   4th toe in [place]  . AMPUTATION Left 12/08/2012   Procedure: AMPUTATION DIGIT;  Surgeon: Mcarthur Rossetti, MD;   Location: [place];  Service: Orthopedics;  Laterality: Left;  3rd toe amputation possible 5th toe amputation  . AMPUTATION Left 12/11/2012   Procedure: Amputation of 2nd Toe;  Surgeon: Mcarthur Rossetti, MD;  Location: [place];  Service: Orthopedics;  Laterality: Left;  . AMPUTATION Right 06/29/2013   Procedure: AMPUTATION BELOW KNEE;  Surgeon: Newt Minion, MD;  Location: [place];  Service: Orthopedics;  Laterality: Right;  . AMPUTATION Left 08/29/2014   Procedure: Left Great Toe Amputation at MTP;  Surgeon: Newt Minion, MD;  Location: [place];  Service: Orthopedics;  Laterality: Left;  . AMPUTATION Left 10/31/2014   Procedure: Midfoot Amputation;  Surgeon: Newt Minion, MD;  Location: [place];  Service: Orthopedics;  Laterality: Left;  . BYPASS GRAFT POPLITEAL TO TIBIAL Left 12/13/2012   Procedure: BYPASS GRAFT POPLITEAL TO TIBIAL;  Surgeon: Serafina Mitchell, MD;  [facility]: [facility];  Service: Vascular;  Laterality: Left;  Left Popliteal to Posterior Tibial Bypass Graft with reversed saphenous vein graft  . CARDIAC CATHETERIZATION    . COLONOSCOPY [name] 04/14/2017   Procedure: COLONOSCOPY;  Surgeon: Danie Binder, MD;  [facility];  Service: Endoscopy;  Laterality: [name];  1000  . I&D EXTREMITY Left 12/08/2012   Procedure: IRRIGATION AND DEBRIDEMENT EXTREMITY;  Surgeon: Mcarthur Rossetti, MD;  Location:  [place] OR;  Service: Orthopedics;  Laterality: Left;  . I&D EXTREMITY Left 12/11/2012   Procedure: REPEAT I&D LEFT FOOT;  Surgeon: Mcarthur Rossetti, MD;  Location: [place];  Service: Orthopedics;  Laterality: Left;  . INCISION AND DRAINAGE Right 06/25/2013   Procedure: INCISION AND DRAINAGE RIGHT FOOT;  Surgeon: Mcarthur Rossetti, MD;  Location: [facility];  Service: Orthopedics;  Laterality: Right;  . LEFT HEART CATHETERIZATION WITH CORONARY ANGIOGRAM [name] 06/28/2013   Procedure: LEFT HEART CATHETERIZATION WITH CORONARY ANGIOGRAM;  Surgeon: Burnell Blanks, MD;  Location: [facility] CATH LAB;   Service: Cardiovascular;  Laterality: [name];  . MASS EXCISION [name] 11/15/2017   Procedure: EXCISION 4CM CYST ON NECK;  Surgeon: Virl Cagey, MD;  Location: [facility];  Service: General;  Laterality: [name];  . PENILE PROSTHESIS IMPLANT    . POLYPECTOMY  04/14/2017   Procedure: POLYPECTOMY;  Surgeon: Danie Binder, MD;  [facility];  Service: Endoscopy;;  colon  . STUMP REVISION Right 08/16/2013   Procedure: STUMP REVISION- right Below Knee Amputation;  Surgeon: Newt Minion, MD;  Location: [place];  Service: Orthopedics;  Laterality: Right;  . TEE WITHOUT CARDIOVERSION [name] 07/03/2013   Procedure: TRANSESOPHAGEAL ECHOCARDIOGRAM (TEE);  Surgeon: Larey Dresser, MD;  Location: [place];  Service: Cardiovascular;  Laterality: [name];  . TONSILLECTOMY AND ADENOIDECTOMY      Family History  Problem Relation Age of Onset  . Pancreatic cancer Mother   . Diabetes Mother   . Hyperlipidemia Mother   . Diabetes Other   . Breast cancer Sister   . Depression Maternal Grandmother   . Heart disease Maternal Grandmother   . Depression Maternal Grandfather   . Heart disease Maternal Grandfather   . Depression Paternal Grandmother   . Heart disease Paternal Grandmother   . Depression Paternal Grandfather   . Heart disease Paternal Grandfather     Social History   Socioeconomic History  . Marital status: Divorced    Spouse name: Not on file  . Number of children: 2  . Years of education: Not on file  . Highest education level: Not on file  Occupational History  . Occupation: Programmer, systems: [employer]    Comment: Disabled  Social Needs  . Financial resource strain: Not on file  . Food insecurity:    Worry: Not on file    Inability: Not on file  . Transportation needs:    Medical: Not on file    Non-medical: Not on file  Tobacco Use  . Smoking status: Never Smoker  . Smokeless tobacco: Never Used  Substance and Sexual Activity  . Alcohol use:  No    Alcohol/week: 0.0 standard drinks  . Drug use: No  . Sexual activity: Never  Lifestyle  . Physical activity:    Days per week: Not on file    Minutes per session: Not on file  . Stress: Not on file  Relationships  . Social connections:    Talks on phone: Not on file    Gets together: Not on file    Attends religious service: Not on file    Active member of club or organization: Not on file    Attends meetings of clubs or organizations: Not on file    Relationship status: Not on file  . Intimate partner violence:    Fear of current or ex partner: Not on file    Emotionally abused: Not on file  Physically abused: Not on file    Forced sexual activity: Not on file  Other Topics Concern  . Not on file  Social History Narrative   Lives in [place]   Wife: Judeen Hammans     Physical Exam  Vital Signs and Nursing Notes reviewed Vitals:   12/29/18 1113 12/29/18 1303  BP: 110/76 102/69  Pulse: 85 75  Resp: 19 14  Temp: 98.6 F (37 C) 99 F (37.2 C)  SpO2: 97% 97%    CONSTITUTIONAL: Chronically ill-appearing, NAD NEURO:  Alert and oriented x 3, no focal deficits EYES:  eyes equal and reactive ENT/NECK:  no LAD, no JVD CARDIO: Regular rate, well-perfused, normal S1 and S2 PULM:  CTAB no wheezing or rhonchi GI/GU:  normal bowel sounds, non-distended, non-tender MSK/SPINE:  No gross deformities, no edema; right low the knee amputation, midfoot amputation on the left SKIN: Erythema and mild edema to the left shin. PSYCH:  Appropriate speech and behavior  Diagnostic and Interventional Summary    Labs Reviewed  CBC - Abnormal; Notable for the following components:      Result Value   WBC 12.3 (*)    Hemoglobin 12.7 (*)    All other components within normal limits  BASIC METABOLIC PANEL - Abnormal; Notable for the following components:   Glucose, Bld 192 (*)    BUN 33 (*)    Creatinine, Ser 1.44 (*)    Calcium 7.9 (*)    GFR calc non Af Amer 54 (*)    All other  components within normal limits  CBG MONITORING, ED - Abnormal; Notable for the following components:   Glucose-Capillary 171 (*)    All other components within normal limits    DG Tibia/Fibula Left  Final Result    DG Foot Complete Left  Final Result      Medications  sulfamethoxazole-trimethoprim (BACTRIM DS,SEPTRA DS) 800-160 MG per tablet 1 tablet (1 tablet Oral Given 12/29/18 1153)  cephALEXin (KEFLEX) capsule 500 mg (500 mg Oral Given 12/29/18 1153)     Procedures Critical Care  ED Course and Medical Decision Making  I have reviewed the triage vital signs and the nursing notes.  Pertinent labs & imaging results that were available during my care of the patient were reviewed by me and considered in my medical decision making (see below for details).  Consistent with cellulitis in a high risk individual, history of diabetes, reported history of PAD per patient, multiple amputations in the past.  However patient is nontoxic, afebrile, normal vital signs, if labs are generally reassuring and x-rays do not show any obvious signs of osteomyelitis, would be candidate for attempted outpatient management with oral antibiotics and close PCP follow-up.  Labs reveal mild leukocytosis, x-ray reveals abnormality of the distal portion of the second metatarsal.  Shared decision-making with patient, who is very nervous about the infection given his history of amputations.  Patient would prefer inpatient management.  Accepted by hospitalist service for further care.  Barth Kirks. [place]onia Small, [place] [email or web address]  Final Clinical Impressions(s) / ED Diagnoses     ICD-10-CM   1. Cellulitis of left lower extremity L03.116     ED Discharge Orders    None         Maudie Flakes, MD 12/29/18 [phone]

## 2018-12-29 NOTE — ED Triage Notes (Signed)
Pt states redness, swelling and pain to left lower leg since Thursday.

## 2018-12-30 LAB — CBC
HCT: 38.7 % — ABNORMAL LOW (ref 39.0–52.0)
Hemoglobin: 12 g/dL — ABNORMAL LOW (ref 13.0–17.0)
MCH: 27.3 pg (ref 26.0–34.0)
MCHC: 31 g/dL (ref 30.0–36.0)
MCV: 88.2 fL (ref 80.0–100.0)
Platelets: 218 10*3/uL (ref 150–400)
RBC: 4.39 MIL/uL (ref 4.22–5.81)
RDW: 14.1 % (ref 11.5–15.5)
WBC: 9.9 10*3/uL (ref 4.0–10.5)
nRBC: 0 % (ref 0.0–0.2)

## 2018-12-30 LAB — HEMOGLOBIN A1C
Hgb A1c MFr Bld: 7.5 % — ABNORMAL HIGH (ref 4.8–5.6)
Mean Plasma Glucose: 168.55 mg/dL

## 2018-12-30 LAB — GLUCOSE, CAPILLARY
Glucose-Capillary: 155 mg/dL — ABNORMAL HIGH (ref 70–99)
Glucose-Capillary: 92 mg/dL (ref 70–99)
Glucose-Capillary: 188 mg/dL — ABNORMAL HIGH (ref 70–99)
Glucose-Capillary: 210 mg/dL — ABNORMAL HIGH (ref 70–99)

## 2018-12-30 LAB — BASIC METABOLIC PANEL
Anion gap: 9 (ref 5–15)
BUN: 28 mg/dL — ABNORMAL HIGH (ref 6–20)
CO2: 26 mmol/L (ref 22–32)
Calcium: 7.7 mg/dL — ABNORMAL LOW (ref 8.9–10.3)
Chloride: 102 mmol/L (ref 98–111)
Creatinine, Ser: 1.47 mg/dL — ABNORMAL HIGH (ref 0.61–1.24)
GFR calc Af Amer: >60 mL/min (ref >60)
GFR calc non Af Amer: 53 mL/min — ABNORMAL LOW (ref >60)
Glucose, Bld: 143 mg/dL — ABNORMAL HIGH (ref 70–99)
Potassium: 3.7 mmol/L (ref 3.5–5.1)
Sodium: 137 mmol/L (ref 135–145)

## 2018-12-30 MED ORDER — INSULIN GLARGINE 100 UNIT/ML ~~LOC~~ SOLN
57.0000 [IU] | Freq: Every day | SUBCUTANEOUS | Status: DC
  Filled 2018-12-30: qty 0.57

## 2018-12-30 MED ORDER — ENOXAPARIN SODIUM 40 MG/0.4ML ~~LOC~~ SOLN
40.0000 mg | SUBCUTANEOUS | Status: DC
  Administered 2018-12-30 – 2018-12-31 (×2): 40 mg via SUBCUTANEOUS
  Filled 2018-12-30 (×2): qty 0.4

## 2018-12-30 MED ORDER — NITROGLYCERIN 0.2 MG/HR TD PT24
0.2000 mg | MEDICATED_PATCH | TRANSDERMAL | Status: DC
  Administered 2018-12-30: 0.2 mg via TRANSDERMAL
  Filled 2018-12-30: qty 1

## 2018-12-30 NOTE — Progress Notes (Signed)
Left leg stump cleaned w/ saline applied bactroban, 2x2s, 2 guaze, and kerlix. Left stump w/ redness and warmth noted. No drainage noted. Will continue to monitor.

## 2018-12-30 NOTE — Progress Notes (Signed)
PROGRESS NOTE  Albert [name] BWG:[id number] DOB: [birth year] DOA: 12/29/2018 PCP: Celene Squibb, MD  HPI/Recap of past 24 hours:  Albert [name] is a 58 y.o. male with medical history significant for type 2 diabetes mellitus, MI, multiple osteomyelitis with multiple amputation, nonhemorrhagic CVA, coronary artery disease GERD hypertension hyperlipidemia who presented to the emergency room today for evaluation redness of his left leg with pain and swelling which he noted today while he was vacuuming.  He stated that he he has not had this kind of redness in his leg for over 3 years.  He was admitted for cellulitis of the left leg for IV antibiotics due to increased risk of losing his limb.  His right BKA and status post left  forefoot amputation  Subjective: Patient seen and examined at bedside he denies any fever or chills had a good night is getting his IV antibiotics  Assessment/Plan: Principal Problem:   Cellulitis in diabetic foot (HCC) Active Problems:   Diabetes mellitus out of control [facility])   Essential hypertension, benign   Atherosclerotic peripheral vascular disease (HCC)   Leukocytosis   Coronary atherosclerosis of native coronary artery   1.  Cellulitis of the left lower extremity with possible life-threatening due to his extensive history.  He has been started on Ancef.  IV  2.  Diabetes mellitus, uncontrolled continue current management PER home insulin  3.  Mild leukocytosis, improving continue IV antibiotics  4.  Hypertension continue medication per home  5.Coronary artery disease stable denies any chest pain continue  6.  Chronic kidney disease stage I patient is on p.o. Lasix his current creatinine is 1.44 Code Status: Full  Severity of Illness: The appropriate patient status for this patient is INPATIENT. Inpatient status is judged to be reasonable and necessary in order to provide the required intensity of service to ensure the patient's safety. The  patient's presenting symptoms, physical exam findings, and initial radiographic and laboratory data in the context of their chronic comorbidities is felt to place them at high risk for further clinical deterioration. Furthermore, it is not anticipated that the patient will be medically stable for discharge from the hospital within 2 midnights of admission. The following factors support the patient status of inpatient.   "  Continues to need IV antibiotics due to his cellulitis with limb threatening risk * I certify that at the point of admission it is my clinical judgment that the patient will require inpatient hospital care spanning beyond 2 midnights from the point of admission due to high intensity of service, high risk for further deterioration and high frequency of surveillance required.*    Family Communication: None at bedside  Disposition Plan: Home after antibiotics   Consultants:  None  Procedures:  None  Antimicrobials:  Ancef IV  DVT prophylaxis: Lovenox   Objective: Vitals:   12/29/18 1900 12/29/18 2042 12/30/18 0534 12/30/18 1537  BP: 125/76 (!) 141/76 122/81 108/62  Pulse:  86 75 72  Resp:  20 18 18   Temp:  99 F (37.2 C) 99.5 F (37.5 C) 98.7 F (37.1 C)  TempSrc:  Oral Oral Oral  SpO2:  100% 98% 96%  Weight:  71.5 kg    Height:  6\' 1"  (1.854 m)      Intake/Output Summary (Last 24 hours) at 12/30/2018 1736 Last data filed at 12/30/2018 0901 Gross per 24 hour  Intake 340 ml  Output 700 ml  Net -360 ml   Filed Weights   12/29/18  1113 12/29/18 2042  Weight: 118.8 kg 71.5 kg   Body mass index is 20.8 kg/m.  Exam:   General: 58 y.o. year-old male well developed well nourished in no acute distress.  Alert and oriented x3.  Cardiovascular: Regular rate and rhythm with no rubs or gallops.  No thyromegaly or JVD noted.    Respiratory: Clear to auscultation with no wheezes or rales. Good inspiratory effort.  Abdomen: Soft nontender nondistended  with normal bowel sounds x4 quadrants.  Musculoskeletal: No lower extremity edema. 2/4 pulses in all 4 extremities.  Skin:Extensive very erythematous and warmness noted in the left leg covering just above the ankle to just below the knee.  They are warm and redness is just slightly improved still significant left leg stump has some lesion no drainage apparently patient has been using nitroglycerin patch to apply to it to him improve circulation  Psychiatry: Mood is appropriate for condition and setting    Data Reviewed: CBC: Recent Labs  Lab 12/27/18 1958 12/29/18 1136 12/30/18 0614  WBC 12.4* 12.3* 9.9  NEUTROABS 10.4*  --   --   HGB 13.4 12.7* 12.0*  HCT 41.4 40.8 38.7*  MCV 86.6 87.2 88.2  PLT 195 205 474   Basic Metabolic Panel: Recent Labs  Lab 12/29/18 1136 12/30/18 0614  NA 136 137  K 3.9 3.7  CL 101 102  CO2 24 26  GLUCOSE 192* 143*  BUN 33* 28*  CREATININE 1.44* 1.47*  CALCIUM 7.9* 7.7*   GFR: Estimated Creatinine Clearance: 56.7 mL/min (A) (by C-G formula based on SCr of 1.47 mg/dL (H)). Liver Function Tests: No results for input(s): AST, ALT, ALKPHOS, BILITOT, PROT, ALBUMIN in the last 168 hours. No results for input(s): LIPASE, AMYLASE in the last 168 hours. No results for input(s): AMMONIA in the last 168 hours. Coagulation Profile: No results for input(s): INR, PROTIME in the last 168 hours. Cardiac Enzymes: No results for input(s): CKTOTAL, CKMB, CKMBINDEX, TROPONINI in the last 168 hours. BNP (last 3 results) No results for input(s): PROBNP in the last 8760 hours. HbA1C: Recent Labs    12/29/18 2139  HGBA1C 7.5*   CBG: Recent Labs  Lab 12/29/18 1438 12/29/18 2044 12/30/18 0732 12/30/18 1125 12/30/18 1605  GLUCAP 171* 275* 155* 92 188*   Lipid Profile: No results for input(s): CHOL, HDL, LDLCALC, TRIG, CHOLHDL, LDLDIRECT in the last 72 hours. Thyroid Function Tests: No results for input(s): TSH, T4TOTAL, FREET4, T3FREE, THYROIDAB in  the last 72 hours. Anemia Panel: No results for input(s): VITAMINB12, FOLATE, FERRITIN, TIBC, IRON, RETICCTPCT in the last 72 hours. Urine analysis:    Component Value Date/Time   COLORURINE AMBER (A) 01/06/2014 1455   APPEARANCEUR CLEAR 01/06/2014 1455   LABSPEC >1.030 (H) 01/06/2014 1455   PHURINE 5.5 01/06/2014 1455   GLUCOSEU NEGATIVE 01/06/2014 1455   HGBUR TRACE (A) 01/06/2014 1455   BILIRUBINUR NEGATIVE 01/06/2014 1455   KETONESUR NEGATIVE 01/06/2014 1455   PROTEINUR 100 (A) 01/06/2014 1455   UROBILINOGEN 0.2 01/06/2014 1455   NITRITE NEGATIVE 01/06/2014 1455   LEUKOCYTESUR NEGATIVE 01/06/2014 1455   Sepsis Labs: @LABRCNTIP (procalcitonin:4,lacticidven:4)  )No results found for this or any previous visit (from the past 240 hour(s)).    Studies: No results found.  Scheduled Meds:  aspirin EC  81 mg Oral Daily   atorvastatin  80 mg Oral q1800   benzonatate  100 mg Oral Q8H   carvedilol  25 mg Oral BID WC   emtricitabine-tenofovir AF  1 tablet Oral Daily  enoxaparin (LOVENOX) injection  40 mg Subcutaneous Q24H   furosemide  80 mg Oral Daily   hydrALAZINE  10 mg Oral TID   insulin aspart  0-9 Units Subcutaneous TID WC   insulin aspart  24 Units Subcutaneous TID AC   insulin glargine  57 Units Subcutaneous Daily   losartan  50 mg Oral Daily   mupirocin ointment  1 application Topical Weekly   nitroGLYCERIN  0.2 mg Transdermal Q Thu   oseltamivir  75 mg Oral Q12H   pantoprazole  40 mg Oral Daily   potassium chloride  10 mEq Oral Daily   sodium chloride flush  3 mL Intravenous Q12H    Continuous Infusions:  sodium chloride      ceFAZolin (ANCEF) IV 2 g (12/30/18 1555)     LOS: 1 day     Cristal Deer, MD [facility]  To reach me or the doctor on call, go to: [email or web address] Password [email or web address]  12/30/2018, 5:36 PM

## 2018-12-30 NOTE — Progress Notes (Signed)
Inpatient Diabetes Program Recommendations  AACE/ADA: New Consensus Statement on Inpatient Glycemic Control (2015)  Target Ranges:  Prepandial:   less than 140 mg/dL      Peak postprandial:   less than 180 mg/dL (1-2 hours)      Critically ill patients:  140 - 180 mg/dL   Lab Results  Component Value Date   GLUCAP 188 (H) 12/30/2018   HGBA1C 7.5 (H) 12/29/2018    Review of Glycemic Control  Diabetes history: DM2 Outpatient Diabetes medications: Humalog 24 units tidwc, Tresiba 57 units QD Current orders for Inpatient glycemic control: Lantus 57 units QD, Novolog 24 units tidwc + 0-9 units tidwc  HgbA1C - 7.5% Lantus not given this am - states "Rescheduled." Order still says Lantus 57 units QD. Sent secure chat to RN for clarification.  Consider decreasing both basal and bolus insulin to avoid hypos.  Inpatient Diabetes Program Recommendations:     Decrease Lantus to 30 units QHS Decrease Novolog to 15 units tidwc  Titrate with goal of 140-180 mg/dL.  Will follow.  Thank you. Lorenda Peck, RD, LDN, CDE Inpatient Diabetes Coordinator [phone]

## 2018-12-31 ENCOUNTER — Inpatient Hospital Stay (HOSPITAL_COMMUNITY): Payer: Medicare Other

## 2018-12-31 DIAGNOSIS — I2510 Atherosclerotic heart disease of native coronary artery without angina pectoris: Secondary | ICD-10-CM

## 2018-12-31 DIAGNOSIS — L03116 Cellulitis of left lower limb: Principal | ICD-10-CM

## 2018-12-31 DIAGNOSIS — I10 Essential (primary) hypertension: Secondary | ICD-10-CM

## 2018-12-31 LAB — GLUCOSE, CAPILLARY
GLUCOSE-CAPILLARY: 184 mg/dL — AB (ref 70–99)
Glucose-Capillary: 117 mg/dL — ABNORMAL HIGH (ref 70–99)
Glucose-Capillary: 254 mg/dL — ABNORMAL HIGH (ref 70–99)
Glucose-Capillary: 182 mg/dL — ABNORMAL HIGH (ref 70–99)

## 2018-12-31 LAB — BASIC METABOLIC PANEL
Anion gap: 9 (ref 5–15)
BUN: 23 mg/dL — ABNORMAL HIGH (ref 6–20)
CO2: 22 mmol/L (ref 22–32)
Calcium: 8.2 mg/dL — ABNORMAL LOW (ref 8.9–10.3)
Chloride: 106 mmol/L (ref 98–111)
Creatinine, Ser: 1.33 mg/dL — ABNORMAL HIGH (ref 0.61–1.24)
GFR calc non Af Amer: 59 mL/min — ABNORMAL LOW (ref >60)
Glucose, Bld: 192 mg/dL — ABNORMAL HIGH (ref 70–99)
Potassium: 3.8 mmol/L (ref 3.5–5.1)
Sodium: 137 mmol/L (ref 135–145)

## 2018-12-31 LAB — CBC
HCT: 38.9 % — ABNORMAL LOW (ref 39.0–52.0)
Hemoglobin: 12.2 g/dL — ABNORMAL LOW (ref 13.0–17.0)
MCH: 27.1 pg (ref 26.0–34.0)
MCHC: 31.4 g/dL (ref 30.0–36.0)
MCV: 86.4 fL (ref 80.0–100.0)
Platelets: 254 10*3/uL (ref 150–400)
RBC: 4.5 MIL/uL (ref 4.22–5.81)
RDW: 14.2 % (ref 11.5–15.5)
WBC: 9.6 10*3/uL (ref 4.0–10.5)
nRBC: 0 % (ref 0.0–0.2)

## 2018-12-31 LAB — INFLUENZA PANEL BY PCR (TYPE A & B)
Influenza A By PCR: NEGATIVE
Influenza B By PCR: NEGATIVE

## 2018-12-31 LAB — SEDIMENTATION RATE: Sed Rate: 45 mm/hr — ABNORMAL HIGH (ref 0–16)

## 2018-12-31 LAB — C-REACTIVE PROTEIN: CRP: 10 mg/dL — ABNORMAL HIGH (ref <1.0)

## 2018-12-31 MED ORDER — INSULIN ASPART 100 UNIT/ML ~~LOC~~ SOLN
12.0000 [IU] | Freq: Three times a day (TID) | SUBCUTANEOUS | Status: DC
  Administered 2018-12-31 – 2019-01-01 (×2): 12 [IU] via SUBCUTANEOUS

## 2018-12-31 MED ORDER — INSULIN GLARGINE 100 UNIT/ML ~~LOC~~ SOLN
30.0000 [IU] | Freq: Every day | SUBCUTANEOUS | Status: DC
  Administered 2018-12-31: 30 [IU] via SUBCUTANEOUS
  Filled 2018-12-31 (×2): qty 0.3

## 2018-12-31 NOTE — Progress Notes (Signed)
PROGRESS NOTE  Albert [name] VQX:[id number] DOB: 1961-08-17 DOA: 12/29/2018 PCP: Celene Squibb, MD  Brief History:  58 year old male with a history of diabetes mellitus, peripheral vascular disease, osteomyelitis of the foot status post right BKA, hypertension presenting with redness, pain, and erythema of the left lower extremity from his ankle up to his lower knee area.  He denies any recent injury or trauma.  Patient visited the emergency department on 12/27/2018 with myalgias, coughing, and sore throat.  He was discharged home in stable condition.  He stated that he was diagnosed with influenza, but this was not noted in the medical record.  The patient denies any continued fever, chest pain, shortness breath, coughing, sore throat.  Because of increasing pain and erythema in his left lower extremity, the patient was admitted for further evaluation and treatment in the setting of his severe diabetic foot infections in the past.  Assessment/Plan: Cellulitis left lower extremity -Continue cefazolin -WBC improving -Noted some drainage in his left foot amputation site -MRI left foot -CRP -ESR  Diabetes mellitus type 2, uncontrolled with hyperglycemia -12/29/2018 hemoglobin A1c 7.5 -Reduce Lantus to 30 units -Reduce pre-meal insulin to 12 units -NovoLog sliding scale  Essential hypertension -Continue carvedilol, hydralazine, losartan  CKD stage II -Baseline creatinine 1.1-1.4 -A.m. BMP  Hyperlipidemia -Continue statin  Coronary artery disease -No chest pain presently -Continue aspirin, Lipitor, carvedilol      Disposition Plan:   Home 1-2 days pending MRI foot results Family Communication:   Family at bedside  Consultants:  none  Code Status:  FULL   DVT Prophylaxis:   [place] Lovenox   Procedures: As Listed in Progress Note Above  Antibiotics: Cefazolin 3/14>>>       Subjective: Patient feels that his erythema and pain in his left lower extremity are  improving.  He noted some drainage in his left foot on the day of admission.  He denies any recent injury or trauma.  He denies any fevers, chills, shortness of breath, coughing, hemoptysis, sore throat.  There is no vomiting, diarrhea, abdominal pain.  Objective: Vitals:   12/30/18 1537 12/30/18 2135 12/31/18 0549 12/31/18 0907  BP: 108/62 126/81 115/65 (!) 111/59  Pulse: 72 73 78   Resp: 18 18 18    Temp: 98.7 F (37.1 C) 99 F (37.2 C) 98.4 F (36.9 C)   TempSrc: Oral Oral Oral   SpO2: 96% 99% 97%   Weight:      Height:        Intake/Output Summary (Last 24 hours) at 12/31/2018 1236 Last data filed at 12/31/2018 0643 Gross per 24 hour  Intake 480 ml  Output 901 ml  Net -421 ml   Weight change:  Exam:   General:  Pt is alert, follows commands appropriately, not in acute distress  HEENT: No icterus, No thrush, No neck mass, [place]/AT  Cardiovascular: RRR, S1/S2, no rubs, no gallops  Respiratory: CTA bilaterally, no wheezing, no crackles, no rhonchi  Abdomen: Soft/+BS, non tender, non distended, no guarding  Extremities: Right BKA without any erythema, drainage, open wounds.  Left lower extremity--see pictures below         Data Reviewed: I have personally reviewed following labs and imaging studies Basic Metabolic Panel: Recent Labs  Lab 12/29/18 1136 12/30/18 0614  NA 136 137  K 3.9 3.7  CL 101 102  CO2 24 26  GLUCOSE 192* 143*  BUN 33* 28*  CREATININE 1.44* 1.47*  CALCIUM 7.9* 7.7*   Liver Function Tests: No results for input(s): AST, ALT, ALKPHOS, BILITOT, PROT, ALBUMIN in the last 168 hours. No results for input(s): LIPASE, AMYLASE in the last 168 hours. No results for input(s): AMMONIA in the last 168 hours. Coagulation Profile: No results for input(s): INR, PROTIME in the last 168 hours. CBC: Recent Labs  Lab 12/27/18 1958 12/29/18 1136 12/30/18 0614  WBC 12.4* 12.3* 9.9  NEUTROABS 10.4*  --   --   HGB 13.4 12.7* 12.0*  HCT 41.4 40.8  38.7*  MCV 86.6 87.2 88.2  PLT 195 205 218   Cardiac Enzymes: No results for input(s): CKTOTAL, CKMB, CKMBINDEX, TROPONINI in the last 168 hours. BNP: Invalid input(s): POCBNP CBG: Recent Labs  Lab 12/30/18 1125 12/30/18 1605 12/30/18 2135 12/31/18 0752 12/31/18 1142  GLUCAP 92 188* 210* 117* 184*   HbA1C: Recent Labs    12/29/18 2139  HGBA1C 7.5*   Urine analysis:    Component Value Date/Time   COLORURINE AMBER (A) 01/06/2014 1455   APPEARANCEUR CLEAR 01/06/2014 1455   LABSPEC >1.030 (H) 01/06/2014 1455   PHURINE 5.5 01/06/2014 1455   GLUCOSEU NEGATIVE 01/06/2014 1455   HGBUR TRACE (A) 01/06/2014 1455   BILIRUBINUR NEGATIVE 01/06/2014 1455   KETONESUR NEGATIVE 01/06/2014 1455   PROTEINUR 100 (A) 01/06/2014 1455   UROBILINOGEN 0.2 01/06/2014 1455   NITRITE NEGATIVE 01/06/2014 1455   LEUKOCYTESUR NEGATIVE 01/06/2014 1455   Sepsis Labs: @LABRCNTIP (procalcitonin:4,lacticidven:4) ) Recent Results (from the past 240 hour(s))  Culture, blood (routine x 2)     Status: None (Preliminary result)   Collection Time: 12/29/18  9:39 PM  Result Value Ref Range Status   Specimen Description BLOOD LEFT ARM  Final   Special Requests   Final    BOTTLES DRAWN AEROBIC ONLY Blood Culture adequate volume   Culture   Final    NO GROWTH 2 DAYS Performed at [facility], [address], [place], [place] [address]    Report Status PENDING  Incomplete  Culture, blood (routine x 2)     Status: None (Preliminary result)   Collection Time: 12/29/18  9:45 PM  Result Value Ref Range Status   Specimen Description BLOOD RIGHT HAND  Final   Special Requests   Final    BOTTLES DRAWN AEROBIC ONLY Blood Culture adequate volume   Culture   Final    NO GROWTH 2 DAYS Performed at [facility], [address], [place], [place] [address]    Report Status PENDING  Incomplete     Scheduled Meds: . aspirin EC  81 mg Oral Daily  . atorvastatin  80 mg Oral q1800  . benzonatate  100 mg Oral Q8H   . carvedilol  25 mg Oral BID WC  . emtricitabine-tenofovir AF  1 tablet Oral Daily  . enoxaparin (LOVENOX) injection  40 mg Subcutaneous Q24H  . furosemide  80 mg Oral Daily  . hydrALAZINE  10 mg Oral TID  . insulin aspart  0-9 Units Subcutaneous TID WC  . insulin aspart  12 Units Subcutaneous TID AC  . insulin glargine  30 Units Subcutaneous QHS  . losartan  50 mg Oral Daily  . mupirocin ointment  1 application Topical Weekly  . nitroGLYCERIN  0.2 mg Transdermal Q Thu  . oseltamivir  75 mg Oral Q12H  . pantoprazole  40 mg Oral Daily  . potassium chloride  10 mEq Oral Daily  . sodium chloride flush  3 mL Intravenous Q12H   Continuous  Infusions: . sodium chloride    .  ceFAZolin (ANCEF) IV 2 g (12/31/18 0600)    Procedures/Studies: Dg Chest 2 View  Result Date: 12/27/2018 CLINICAL DATA:  Fever and cough EXAM: CHEST - 2 VIEW COMPARISON:  10/17/2018 FINDINGS: The heart size and mediastinal contours are within normal limits. Both lungs are clear. The visualized skeletal structures are unremarkable. IMPRESSION: No active cardiopulmonary disease. Electronically Signed   By: Ulyses Jarred M.D.   On: 12/27/2018 19:18   Dg Tibia/fibula Left  Result Date: 12/29/2018 CLINICAL DATA:  Redness in lower leg yesterday. History of cellulitis. EXAM: LEFT TIBIA AND FIBULA - 2 VIEW COMPARISON:  None. FINDINGS: Vascular calcifications. No bony erosions, fractures, or dislocations. IMPRESSION: Vascular calcifications. No acute bony abnormalities. No soft tissue air noted. Electronically Signed   By: Dorise Bullion III M.D   On: 12/29/2018 13:09   Dg Foot Complete Left  Result Date: 12/29/2018 CLINICAL DATA:  Redness and lower leg yesterday. History of cellulitis. EXAM: LEFT FOOT - COMPLETE 3+ VIEW COMPARISON:  None. FINDINGS: Patient is status post amputation of the distal foot. The remaining first, third, fourth, and fifth metatarsals are not significantly changed. There is some mild increased  irregularity in the distal remaining second metatarsal. There is also increased attenuation in the adjacent soft tissues, likely soft tissue calcification. I suspect these findings are chronic rather than acute osteomyelitis. MRI could better assess this region. There may be some distal soft tissue swelling. Definitive soft tissue gas. Vascular calcifications noted. IMPRESSION: 1. Mild increased irregularity of the remaining distal second metatarsal. I favor a chronic process with no definitive evidence of osteomyelitis. An MRI could better assess if clinical concern persists. Electronically Signed   By: Dorise Bullion III M.D   On: 12/29/2018 13:11    Orson Eva, DO  [facility] Pager [phone]  If 7PM-7AM, please contact night-coverage [email or web address] Password [facility] 12/31/2018, 12:36 PM   LOS: 2 days

## 2019-01-01 DIAGNOSIS — I70209 Unspecified atherosclerosis of native arteries of extremities, unspecified extremity: Secondary | ICD-10-CM

## 2019-01-01 LAB — HIV ANTIBODY (ROUTINE TESTING W REFLEX): HIV Screen 4th Generation wRfx: NONREACTIVE

## 2019-01-01 LAB — GLUCOSE, CAPILLARY
GLUCOSE-CAPILLARY: 83 mg/dL (ref 70–99)
Glucose-Capillary: 178 mg/dL — ABNORMAL HIGH (ref 70–99)

## 2019-01-01 MED ORDER — CEPHALEXIN 500 MG PO CAPS: 500.0000 mg | ORAL_CAPSULE | Freq: Four times a day (QID) | ORAL | 0 refills | Status: DC

## 2019-01-01 MED ORDER — CEPHALEXIN 500 MG PO CAPS: 500.0000 mg | ORAL_CAPSULE | Freq: Four times a day (QID) | ORAL | Status: DC

## 2019-01-01 NOTE — Progress Notes (Signed)
Pat's IV catheter removed and intact. Pt's IV site clean dry and intact. Discharge instructions were reviewed and discussed with patient's significant other. All questions were answered and no further questions at this time. Pt escorted by RN

## 2019-01-01 NOTE — Care Management Important Message (Signed)
Important Message  Patient Details  Name: AARIC DOLPH MRN: [id number] Date of Birth: 1961/01/07   Medicare Important Message Given:  Yes    Adra Shepler, Chauncey Reading, RN 01/01/2019, 11:31 AM

## 2019-01-01 NOTE — Progress Notes (Signed)
Patient is discharged but states that his wife is unable to pick him up until 4.30.

## 2019-01-01 NOTE — Discharge Summary (Addendum)
Physician Discharge Summary  Albert [name] [id number]:[id number] DOB: 1961-01-01 DOA: 12/29/2018  PCP: Celene Squibb, MD  Admit date: 12/29/2018 Discharge date: 01/01/2019  Admitted From: Home Disposition:  Home   Recommendations for Outpatient Follow-up:  1. Follow up with PCP in 1-2 weeks 2. Please obtain BMP/CBC in one week   Discharge Condition: Stable CODE STATUS: FULL Diet recommendation: Heart Healthy / Carb Modified   Brief/Interim Summary: 58 year old male with a history of diabetes mellitus, peripheral vascular disease, osteomyelitis of the foot status post right BKA, hypertension presenting with redness, pain, and erythema of the left lower extremity from his ankle up to his lower knee area.  He denies any recent injury or trauma.  Patient visited the emergency department on 12/27/2018 with myalgias, coughing, and sore throat.  He was discharged home in stable condition.  He stated that he was diagnosed with influenza, but this was not noted in the medical record.  The patient denies any continued fever, chest pain, shortness breath, coughing, sore throat.  Because of increasing pain and erythema in his left lower extremity, the patient was admitted for further evaluation and treatment in the setting of his severe diabetic foot infections in the past.  Influenza PCR was negative-->d/c oseltamivir MRI left foot neg for abscess or osteomyelitis  Discharge Diagnoses:   Cellulitis left lower extremity -Continue cefazolin during hospitalization -WBC improving -Noted some drainage in his left foot amputation site -MRI left foot--no abscess, no osteomyelitis, no septic joint -CRP--10.0 -ESR--45 -d/c home with cephalexin x 4 more days to complete 1 week of tx  Diabetes mellitus type 2, uncontrolled with hyperglycemia -12/29/2018 hemoglobin A1c 7.5 -Reduce Lantus to 30 units -Reduce pre-meal insulin to 12 units -NovoLog sliding scale -resume home doses insulin after  d/c  Essential hypertension -Continue carvedilol, hydralazine, losartan  CKD stage III -Baseline creatinine 1.1-1.4 -A.m. BMP -serum creatinine 1.33 on day of d/c  HIV prophylaxis -pt on daily truvada at home -spouse is HIV positive -close monitoring of renal function in pt with CKD 3  Hyperlipidemia -Continue statin  Coronary artery disease -No chest pain presently -Continue aspirin, Lipitor, carvedilol   Discharge Instructions   Allergies as of 01/01/2019      Reactions   Amoxicillin Nausea And Vomiting   Has patient had a PCN reaction causing immediate rash, facial/tongue/throat swelling, SOB or lightheadedness with hypotension: No Has patient had a PCN reaction causing severe rash involving mucus membranes or skin necrosis: No Has patient had a PCN reaction that required hospitalization: No Has patient had a PCN reaction occurring within the last 10 years: Yes If all of the above answers are "NO", then may proceed with Cephalosporin use.      Medication List    STOP taking these medications   azithromycin 250 MG tablet Commonly known as:  Zithromax   HYDROcodone-homatropine 5-1.5 MG/5ML syrup Commonly known as:  HYCODAN   naproxen sodium 220 MG tablet Commonly known as:  ALEVE   oseltamivir 75 MG capsule Commonly known as:  TAMIFLU     TAKE these medications   aspirin EC 81 MG tablet Take 1 tablet (81 mg total) by mouth daily.   atorvastatin 80 MG tablet Commonly known as:  LIPITOR Take 80 mg by mouth daily.   benzonatate 100 MG capsule Commonly known as:  TESSALON Take 1 capsule (100 mg total) by mouth every 8 (eight) hours.   carvedilol 25 MG tablet Commonly known as:  COREG Take 25 mg by mouth 2 (two)  times daily.   cephALEXin 500 MG capsule Commonly known as:  KEFLEX Take 1 capsule (500 mg total) by mouth every 6 (six) hours.   furosemide 80 MG tablet Commonly known as:  LASIX Take 80 mg by mouth daily. May take additional 40 mg as  needed for weight gain   HumaLOG KwikPen 100 UNIT/ML KwikPen Generic drug:  insulin lispro Inject 24 Units into the skin 3 (three) times daily before meals.   hydrALAZINE 10 MG tablet Commonly known as:  APRESOLINE Take 1 tablet (10 mg total) by mouth 3 (three) times daily.   losartan 50 MG tablet Commonly known as:  COZAAR TAKE 1 TABLET BY MOUTH  DAILY   mupirocin ointment 2 % Commonly known as:  Bactroban Apply 1 application topically daily. What changed:    when to take this  additional instructions   nitroGLYCERIN 0.2 mg/hr patch Commonly known as:  NITRODUR - Dosed in mg/24 hr Place 0.2 mg onto the skin once a week. Applies to foot/ankle area to increase blood flow   pantoprazole 40 MG tablet Commonly known as:  PROTONIX Take 1 tablet (40 mg total) by mouth daily.   potassium chloride 10 MEQ tablet Commonly known as:  K-DUR,KLOR-CON Take 1 tablet (10 mEq total) by mouth daily.   senna-docusate 8.6-50 MG tablet Commonly known as:  Senokot-S Take 1 tablet by mouth daily as needed for mild constipation.   Tyler Aas FlexTouch 100 UNIT/ML Sopn FlexTouch Pen Generic drug:  insulin degludec Inject 57 Units into the skin daily.   Truvada 200-300 MG tablet Generic drug:  emtricitabine-tenofovir Take 1 tablet by mouth daily.       Allergies  Allergen Reactions   Amoxicillin Nausea And Vomiting    Has patient had a PCN reaction causing immediate rash, facial/tongue/throat swelling, SOB or lightheadedness with hypotension: No Has patient had a PCN reaction causing severe rash involving mucus membranes or skin necrosis: No Has patient had a PCN reaction that required hospitalization: No Has patient had a PCN reaction occurring within the last 10 years: Yes If all of the above answers are "NO", then may proceed with Cephalosporin use.     Consultations:  none   Procedures/Studies: Dg Chest 2 View  Result Date: 12/27/2018 CLINICAL DATA:  Fever and cough EXAM:  CHEST - 2 VIEW COMPARISON:  10/17/2018 FINDINGS: The heart size and mediastinal contours are within normal limits. Both lungs are clear. The visualized skeletal structures are unremarkable. IMPRESSION: No active cardiopulmonary disease. Electronically Signed   By: Ulyses Jarred M.D.   On: 12/27/2018 19:18   Dg Tibia/fibula Left  Result Date: 12/29/2018 CLINICAL DATA:  Redness in lower leg yesterday. History of cellulitis. EXAM: LEFT TIBIA AND FIBULA - 2 VIEW COMPARISON:  None. FINDINGS: Vascular calcifications. No bony erosions, fractures, or dislocations. IMPRESSION: Vascular calcifications. No acute bony abnormalities. No soft tissue air noted. Electronically Signed   By: Dorise Bullion III M.D   On: 12/29/2018 13:09   Mr Foot Left Wo Contrast  Result Date: 12/31/2018 CLINICAL DATA:  History of prior transmetatarsal amputation for osteomyelitis. Open wound on the stump for 2 months. EXAM: MRI OF THE LEFT FOOT WITHOUT CONTRAST TECHNIQUE: Multiplanar, multisequence MR imaging of the left foot was performed. No intravenous contrast was administered. COMPARISON:  Plain films left foot 12/29/2018. FINDINGS: Bones/Joint/Cartilage The patient is status post transmetatarsal amputation as seen on the prior examination. No marrow signal abnormality suggestive of osteomyelitis is identified. There is extensive subchondral edema about the calcaneocuboid  joint which is likely due for age advanced degenerative disease. Ligaments Intact. Muscles and Tendons No intramuscular fluid collection or evidence of tenosynovitis. Soft tissues There is some subcutaneous edema about the stump. Skin wound is noted. No abscess. IMPRESSION: Skin wound on the stump without underlying abscess, septic joint or evidence of osteomyelitis. Marrow edema about the calcaneocuboid joint has an appearance most consistent with degenerative disease. Electronically Signed   By: Inge Rise M.D.   On: 12/31/2018 13:04   Dg Foot Complete  Left  Result Date: 12/29/2018 CLINICAL DATA:  Redness and lower leg yesterday. History of cellulitis. EXAM: LEFT FOOT - COMPLETE 3+ VIEW COMPARISON:  None. FINDINGS: Patient is status post amputation of the distal foot. The remaining first, third, fourth, and fifth metatarsals are not significantly changed. There is some mild increased irregularity in the distal remaining second metatarsal. There is also increased attenuation in the adjacent soft tissues, likely soft tissue calcification. I suspect these findings are chronic rather than acute osteomyelitis. MRI could better assess this region. There may be some distal soft tissue swelling. Definitive soft tissue gas. Vascular calcifications noted. IMPRESSION: 1. Mild increased irregularity of the remaining distal second metatarsal. I favor a chronic process with no definitive evidence of osteomyelitis. An MRI could better assess if clinical concern persists. Electronically Signed   By: Dorise Bullion III M.D   On: 12/29/2018 13:11        Discharge Exam: Vitals:   01/01/19 0523 01/01/19 0935  BP: 132/80 132/90  Pulse: 75   Resp: 16   Temp: 98.1 F (36.7 C)   SpO2: 97%    Vitals:   12/31/18 1441 12/31/18 2213 01/01/19 0523 01/01/19 0935  BP: 117/74 130/74 132/80 132/90  Pulse: 67 70 75   Resp: 18 16 16    Temp: 99.1 F (37.3 C) 98.2 F (36.8 C) 98.1 F (36.7 C)   TempSrc: Oral Oral Oral   SpO2: 95% 99% 97%   Weight:      Height:        General: Pt is alert, awake, not in acute distress Cardiovascular: RRR, S1/S2 +, no rubs, no gallops Respiratory: CTA bilaterally, no wheezing, no rhonchi Abdominal: Soft, NT, ND, bowel sounds + Extremities: no edema, no cyanosis   The results of significant diagnostics from this hospitalization (including imaging, microbiology, ancillary and laboratory) are listed below for reference.    Significant Diagnostic Studies: Dg Chest 2 View  Result Date: 12/27/2018 CLINICAL DATA:  Fever and cough  EXAM: CHEST - 2 VIEW COMPARISON:  10/17/2018 FINDINGS: The heart size and mediastinal contours are within normal limits. Both lungs are clear. The visualized skeletal structures are unremarkable. IMPRESSION: No active cardiopulmonary disease. Electronically Signed   By: Ulyses Jarred M.D.   On: 12/27/2018 19:18   Dg Tibia/fibula Left  Result Date: 12/29/2018 CLINICAL DATA:  Redness in lower leg yesterday. History of cellulitis. EXAM: LEFT TIBIA AND FIBULA - 2 VIEW COMPARISON:  None. FINDINGS: Vascular calcifications. No bony erosions, fractures, or dislocations. IMPRESSION: Vascular calcifications. No acute bony abnormalities. No soft tissue air noted. Electronically Signed   By: Dorise Bullion III M.D   On: 12/29/2018 13:09   Mr Foot Left Wo Contrast  Result Date: 12/31/2018 CLINICAL DATA:  History of prior transmetatarsal amputation for osteomyelitis. Open wound on the stump for 2 months. EXAM: MRI OF THE LEFT FOOT WITHOUT CONTRAST TECHNIQUE: Multiplanar, multisequence MR imaging of the left foot was performed. No intravenous contrast was administered. COMPARISON:  Plain  films left foot 12/29/2018. FINDINGS: Bones/Joint/Cartilage The patient is status post transmetatarsal amputation as seen on the prior examination. No marrow signal abnormality suggestive of osteomyelitis is identified. There is extensive subchondral edema about the calcaneocuboid joint which is likely due for age advanced degenerative disease. Ligaments Intact. Muscles and Tendons No intramuscular fluid collection or evidence of tenosynovitis. Soft tissues There is some subcutaneous edema about the stump. Skin wound is noted. No abscess. IMPRESSION: Skin wound on the stump without underlying abscess, septic joint or evidence of osteomyelitis. Marrow edema about the calcaneocuboid joint has an appearance most consistent with degenerative disease. Electronically Signed   By: Inge Rise M.D.   On: 12/31/2018 13:04   Dg Foot Complete  Left  Result Date: 12/29/2018 CLINICAL DATA:  Redness and lower leg yesterday. History of cellulitis. EXAM: LEFT FOOT - COMPLETE 3+ VIEW COMPARISON:  None. FINDINGS: Patient is status post amputation of the distal foot. The remaining first, third, fourth, and fifth metatarsals are not significantly changed. There is some mild increased irregularity in the distal remaining second metatarsal. There is also increased attenuation in the adjacent soft tissues, likely soft tissue calcification. I suspect these findings are chronic rather than acute osteomyelitis. MRI could better assess this region. There may be some distal soft tissue swelling. Definitive soft tissue gas. Vascular calcifications noted. IMPRESSION: 1. Mild increased irregularity of the remaining distal second metatarsal. I favor a chronic process with no definitive evidence of osteomyelitis. An MRI could better assess if clinical concern persists. Electronically Signed   By: Dorise Bullion III M.D   On: 12/29/2018 13:11     Microbiology: Recent Results (from the past 240 hour(s))  Culture, blood (routine x 2)     Status: None (Preliminary result)   Collection Time: 12/29/18  9:39 PM  Result Value Ref Range Status   Specimen Description BLOOD LEFT ARM  Final   Special Requests   Final    BOTTLES DRAWN AEROBIC ONLY Blood Culture adequate volume   Culture   Final    NO GROWTH 3 DAYS Performed at [facility], [address] [place], [place] [address]    Report Status PENDING  Incomplete  Culture, blood (routine x 2)     Status: None (Preliminary result)   Collection Time: 12/29/18  9:45 PM  Result Value Ref Range Status   Specimen Description BLOOD RIGHT HAND  Final   Special Requests   Final    BOTTLES DRAWN AEROBIC ONLY Blood Culture adequate volume   Culture   Final    NO GROWTH 3 DAYS Performed at [facility], [address], [place], [place] [address]    Report Status PENDING  Incomplete     Labs: Basic Metabolic  Panel: Recent Labs  Lab 12/29/18 1136 12/30/18 0614 12/31/18 1312  NA 136 137 137  K 3.9 3.7 3.8  CL 101 102 106  CO2 24 26 22   GLUCOSE 192* 143* 192*  BUN 33* 28* 23*  CREATININE 1.44* 1.47* 1.33*  CALCIUM 7.9* 7.7* 8.2*   Liver Function Tests: No results for input(s): AST, ALT, ALKPHOS, BILITOT, PROT, ALBUMIN in the last 168 hours. No results for input(s): LIPASE, AMYLASE in the last 168 hours. No results for input(s): AMMONIA in the last 168 hours. CBC: Recent Labs  Lab 12/27/18 1958 12/29/18 1136 12/30/18 0614 12/31/18 1312  WBC 12.4* 12.3* 9.9 9.6  NEUTROABS 10.4*  --   --   --   HGB 13.4 12.7* 12.0* 12.2*  HCT  41.4 40.8 38.7* 38.9*  MCV 86.6 87.2 88.2 86.4  PLT 195 205 218 254   Cardiac Enzymes: No results for input(s): CKTOTAL, CKMB, CKMBINDEX, TROPONINI in the last 168 hours. BNP: Invalid input(s): POCBNP CBG: Recent Labs  Lab 12/31/18 0752 12/31/18 1142 12/31/18 1622 12/31/18 2220 01/01/19 0734  GLUCAP 117* 184* 254* 182* 83    Time coordinating discharge:  36 minutes  Signed:  Orson Eva, DO [facility] Pager: [phone] 01/01/2019, 10:44 AM

## 2019-01-02 DIAGNOSIS — L03116 Cellulitis of left lower limb: Secondary | ICD-10-CM | POA: Diagnosis not present

## 2019-01-03 LAB — CULTURE, BLOOD (ROUTINE X 2)
Culture: NO GROWTH
Culture: NO GROWTH
Special Requests: ADEQUATE
Special Requests: ADEQUATE

## 2019-01-07 ENCOUNTER — Encounter (INDEPENDENT_AMBULATORY_CARE_PROVIDER_SITE_OTHER): Payer: Self-pay | Admitting: Orthopedic Surgery

## 2019-01-07 ENCOUNTER — Ambulatory Visit (INDEPENDENT_AMBULATORY_CARE_PROVIDER_SITE_OTHER): Payer: Medicare Other | Admitting: Physician Assistant

## 2019-01-07 ENCOUNTER — Other Ambulatory Visit: Payer: Self-pay

## 2019-01-07 VITALS — Ht 73.0 in | Wt 157.0 lb

## 2019-01-07 DIAGNOSIS — S88111A Complete traumatic amputation at level between knee and ankle, right lower leg, initial encounter: Secondary | ICD-10-CM

## 2019-01-07 DIAGNOSIS — E1142 Type 2 diabetes mellitus with diabetic polyneuropathy: Secondary | ICD-10-CM | POA: Diagnosis not present

## 2019-01-07 DIAGNOSIS — Z794 Long term (current) use of insulin: Secondary | ICD-10-CM | POA: Diagnosis not present

## 2019-01-07 DIAGNOSIS — L97421 Non-pressure chronic ulcer of left heel and midfoot limited to breakdown of skin: Secondary | ICD-10-CM

## 2019-01-07 DIAGNOSIS — Z89432 Acquired absence of left foot: Secondary | ICD-10-CM | POA: Diagnosis not present

## 2019-01-07 NOTE — Progress Notes (Signed)
Office Visit Note   Patient: Albert [name]           Date of Birth: 1961/09/06           MRN: [id number] Visit Date: 01/07/2019              Requested by: Celene Squibb, MD [address] [place], [place] [address] PCP: Celene Squibb, MD  Chief Complaint  Patient presents with   Left Foot - Pain      HPI: The patient is a 58 year old gentleman with a history of a left foot transmetatarsal amputation in 2016, who was recently admitted to the hospital in mid March of this year with cellulitis of the left lower extremity.  He reports that he developed some drainage over the distal transmetatarsal amputation site.  He does have custom shoes and inserts from [employer] but these are old and he is not been able to pursue getting new ones as yet due to cost.  He reports that he still had some minimal drainage from the distal ulcer over the transmetatarsal amputation site.  Assessment & Plan: Visit Diagnoses:  1. Midfoot ulcer, left, limited to breakdown of skin ([place])   2. History of transmetatarsal amputation of left foot ([place])   3. Type 2 diabetes mellitus with diabetic polyneuropathy, with long-term current use of insulin ([place])   4. Amputated below knee, right ([place])     Plan: left foot ulcer was debrided with a #10 blade knife and the patient tolerated this well. Will apply iodosorb ointment to the ulcer daily after washing with Dial soap and water. Off load the foot as much as possible and elevate as much as possible.  Follow up in about 10 days or sooner if difficulties in the interim.   Follow-Up Instructions: Return in about 10 days (around 01/17/2019).   Ortho Exam  Patient is alert, oriented, no adenopathy, well-dressed, normal affect, normal respiratory effort. There is ulcer and callus over the left transmetatarsal amputation residual limb distally.  There is fat layer exposed as well as callus.  This was debrided with a #10 blade knife and hemostasis was achieved with silver  nitrate.  The patient tolerated this well.  The residual wound is 1.5 x 1 x 0.3 cm.  He does have good palpable dorsalis pedis and posterior tibialis pulses.  There are no signs of cellulitis in the foot currently or in the left lower extremity.  He is wearing compression.  Imaging: No results found.   Labs: Lab Results  Component Value Date   HGBA1C 7.5 (H) 12/29/2018   HGBA1C 7.7 (H) 11/10/2017   HGBA1C 9.1 (H) 02/08/2016   ESRSEDRATE 45 (H) 12/31/2018   CRP 10.0 (H) 12/31/2018   REPTSTATUS 01/03/2019 FINAL 12/29/2018   CULT  12/29/2018    NO GROWTH 5 DAYS Performed at [facility], [address], [place], [place] [address]    [place] 06/26/2013     Lab Results  Component Value Date   ALBUMIN 3.1 (L) 05/17/2015   ALBUMIN 4.0 10/31/2014   ALBUMIN 4.1 08/28/2014    Body mass index is 20.71 kg/m.  Orders:  No orders of the defined types were placed in this encounter.  No orders of the defined types were placed in this encounter.    Procedures: No procedures performed  Clinical Data: No additional findings.  ROS:  All other systems negative, except as noted in the HPI. Review of Systems  Objective: Vital Signs:  Ht 6\' 1"  (1.854 m)    Wt 157 lb (71.2 kg)    BMI 20.71 kg/m   Specialty Comments:  No specialty comments available.  PMFS History: Patient Active Problem List   Diagnosis Date Noted   Cellulitis of left lower extremity    Cellulitis in diabetic foot ([place]) 12/29/2018   Sebaceous cyst    Amputated below knee, right ([place]) 10/19/2017   History of transmetatarsal amputation of left foot ([place]) 06/12/2017   Special screening for malignant neoplasms, colon    Midfoot ulcer, left, limited to breakdown of skin ([place]) 11/18/2016   ARF (acute renal failure) ([place]) 02/08/2016   Chronic combined systolic and diastolic CHF (congestive heart failure) ([place]) 02/08/2016   Splenic infarct 79/89/2119   Acute systolic CHF (congestive  heart failure) ([place]) 08/03/2013   Hx of right BKA ([place]) 07/26/2013   Anemia 07/15/2013   Hyperkalemia 07/15/2013   Hyposmolality and/or hyponatremia 07/15/2013   Acute respiratory failure ([place]) 07/15/2013   Mitral regurgitation 07/15/2013   Oliguria 07/15/2013   Bacteremia due to Staphylococcus aureus 07/15/2013   Hyperchloremia 07/15/2013   Acute kidney failure ([place]) 07/14/2013   Hypotension, unspecified 41/74/0814   Acute systolic heart failure ([place]) 07/12/2013   Acute pulmonary edema ([place]) 07/11/2013   Pneumonia, organism unspecified(486) 07/11/2013   Hypoxemia 07/11/2013   Cardiomyopathy, ischemic 07/03/2013   Coronary atherosclerosis of native coronary artery 07/01/2013   NSTEMI (non-ST elevated myocardial infarction) ([place]) 06/26/2013   AKI (acute kidney injury) ([place]) 06/25/2013   Shock circulatory ([place]) 06/25/2013   Dehydration with hyponatremia 06/25/2013   Leukocytosis 06/25/2013   Sepsis(995.91) 06/25/2013   Aftercare following surgery of the circulatory system, NEC 02/11/2013   Peripheral vascular disease, unspecified ([place]) 12/31/2012   Diabetes mellitus out of control ([place]) 12/17/2012   Essential hypertension, benign 12/17/2012   Atherosclerotic peripheral vascular disease ([place]) 12/17/2012   Hypercholesteremia    Past Medical History:  Diagnosis Date   Allergic rhinitis    Anemia    takes supplement   Chronic cough    Chronic sinusitis    Complication of anesthesia    couldn't swallow and talk   Coronary artery disease    CVA (cerebral infarction) 2011   R hand deficit   Diabetes mellitus without complication ([place]) ~4818   last HbA1c ~9 .... fasting 160s   GERD (gastroesophageal reflux disease)    Heart murmur    Hypercholesteremia    Hypertension    Myocardial infarction ([place]) 2014   Osteomyelitis ([place])    first and third metatarsal   Pneumonia    hx of walking   Splenic infarct    setting of lupus  anticoagulant   Stroke [facility]) 2011   right arm weakness    Family History  Problem Relation Age of Onset   Pancreatic cancer Mother    Diabetes Mother    Hyperlipidemia Mother    Diabetes Other    Breast cancer Sister    Depression Maternal Grandmother    Heart disease Maternal Grandmother    Depression Maternal Grandfather    Heart disease Maternal Grandfather    Depression Paternal Grandmother    Heart disease Paternal Grandmother    Depression Paternal Grandfather    Heart disease Paternal Grandfather     Past Surgical History:  Procedure Laterality Date   4th Toe Amputation Left Jan. 2014   4th toe in [place]   AMPUTATION Left 12/08/2012   Procedure: AMPUTATION DIGIT;  Surgeon: Mcarthur Rossetti, MD;  Location: [place];  Service: Orthopedics;  Laterality: Left;  3rd toe amputation possible 5th toe amputation   AMPUTATION Left 12/11/2012   Procedure: Amputation of 2nd Toe;  Surgeon: Mcarthur Rossetti, MD;  Location: [place];  Service: Orthopedics;  Laterality: Left;   AMPUTATION Right 06/29/2013   Procedure: AMPUTATION BELOW KNEE;  Surgeon: Newt Minion, MD;  Location: [place];  Service: Orthopedics;  Laterality: Right;   AMPUTATION Left 08/29/2014   Procedure: Left Great Toe Amputation at MTP;  Surgeon: Newt Minion, MD;  Location: [place];  Service: Orthopedics;  Laterality: Left;   AMPUTATION Left 10/31/2014   Procedure: Midfoot Amputation;  Surgeon: Newt Minion, MD;  Location: [place];  Service: Orthopedics;  Laterality: Left;   BYPASS GRAFT POPLITEAL TO TIBIAL Left 12/13/2012   Procedure: BYPASS GRAFT POPLITEAL TO TIBIAL;  Surgeon: Serafina Mitchell, MD;  Location: [place];  Service: Vascular;  Laterality: Left;  Left Popliteal to Posterior Tibial Bypass Graft with reversed saphenous vein graft   CARDIAC CATHETERIZATION     COLONOSCOPY [name] 04/14/2017   Procedure: COLONOSCOPY;  Surgeon: Danie Binder, MD;  [facility];  Service: Endoscopy;   Laterality: [name];  1000   I&D EXTREMITY Left 12/08/2012   Procedure: IRRIGATION AND DEBRIDEMENT EXTREMITY;  Surgeon: Mcarthur Rossetti, MD;  Location: [place];  Service: Orthopedics;  Laterality: Left;   I&D EXTREMITY Left 12/11/2012   Procedure: REPEAT I&D LEFT FOOT;  Surgeon: Mcarthur Rossetti, MD;  Location: [place];  Service: Orthopedics;  Laterality: Left;   INCISION AND DRAINAGE Right 06/25/2013   Procedure: INCISION AND DRAINAGE RIGHT FOOT;  Surgeon: Mcarthur Rossetti, MD;  [facility];  Service: Orthopedics;  Laterality: Right;   LEFT HEART CATHETERIZATION WITH CORONARY ANGIOGRAM [name] 06/28/2013   Procedure: LEFT HEART CATHETERIZATION WITH CORONARY ANGIOGRAM;  Surgeon: Burnell Blanks, MD;  Location: [facility] CATH LAB;  Service: Cardiovascular;  Laterality: [name];   MASS EXCISION [name] 11/15/2017   Procedure: EXCISION 4CM CYST ON NECK;  Surgeon: Virl Cagey, MD;  [facility];  Service: General;  Laterality: [name];   PENILE PROSTHESIS IMPLANT     POLYPECTOMY  04/14/2017   Procedure: POLYPECTOMY;  Surgeon: Danie Binder, MD;  [facility];  Service: Endoscopy;;  colon   STUMP REVISION Right 08/16/2013   Procedure: STUMP REVISION- right Below Knee Amputation;  Surgeon: Newt Minion, MD;  Location: [place];  Service: Orthopedics;  Laterality: Right;   TEE WITHOUT CARDIOVERSION [name] 07/03/2013   Procedure: TRANSESOPHAGEAL ECHOCARDIOGRAM (TEE);  Surgeon: Larey Dresser, MD;  Location: [facility] ENDOSCOPY;  Service: Cardiovascular;  Laterality: [name];   TONSILLECTOMY AND ADENOIDECTOMY     Social History   Occupational History   Occupation: Print production planner labor    Employer: [employer]    Comment: Disabled  Tobacco Use   Smoking status: Never Smoker   Smokeless tobacco: Never Used  Substance and Sexual Activity   Alcohol use: No    Alcohol/week: 0.0 standard drinks   Drug use: No   Sexual activity: Never

## 2019-01-08 ENCOUNTER — Encounter (INDEPENDENT_AMBULATORY_CARE_PROVIDER_SITE_OTHER): Payer: Self-pay | Admitting: Physician Assistant

## 2019-01-08 DIAGNOSIS — R110 Nausea: Secondary | ICD-10-CM | POA: Diagnosis not present

## 2019-01-08 DIAGNOSIS — L03116 Cellulitis of left lower limb: Secondary | ICD-10-CM | POA: Diagnosis not present

## 2019-01-08 DIAGNOSIS — N182 Chronic kidney disease, stage 2 (mild): Secondary | ICD-10-CM | POA: Diagnosis not present

## 2019-01-08 DIAGNOSIS — L84 Corns and callosities: Secondary | ICD-10-CM | POA: Diagnosis not present

## 2019-01-08 DIAGNOSIS — I5032 Chronic diastolic (congestive) heart failure: Secondary | ICD-10-CM | POA: Diagnosis not present

## 2019-01-16 ENCOUNTER — Ambulatory Visit (INDEPENDENT_AMBULATORY_CARE_PROVIDER_SITE_OTHER): Payer: Self-pay | Admitting: Family

## 2019-01-17 ENCOUNTER — Ambulatory Visit (INDEPENDENT_AMBULATORY_CARE_PROVIDER_SITE_OTHER): Payer: Medicare Other | Admitting: Orthopedic Surgery

## 2019-01-24 DIAGNOSIS — E1165 Type 2 diabetes mellitus with hyperglycemia: Secondary | ICD-10-CM | POA: Diagnosis not present

## 2019-01-24 DIAGNOSIS — Z89511 Acquired absence of right leg below knee: Secondary | ICD-10-CM | POA: Diagnosis not present

## 2019-01-24 DIAGNOSIS — I2510 Atherosclerotic heart disease of native coronary artery without angina pectoris: Secondary | ICD-10-CM | POA: Diagnosis not present

## 2019-01-24 DIAGNOSIS — K219 Gastro-esophageal reflux disease without esophagitis: Secondary | ICD-10-CM | POA: Diagnosis not present

## 2019-02-20 DIAGNOSIS — Z0000 Encounter for general adult medical examination without abnormal findings: Secondary | ICD-10-CM | POA: Diagnosis not present

## 2019-02-21 DIAGNOSIS — M545 Low back pain: Secondary | ICD-10-CM | POA: Diagnosis not present

## 2019-02-27 DIAGNOSIS — M545 Low back pain: Secondary | ICD-10-CM | POA: Diagnosis not present

## 2019-02-28 ENCOUNTER — Other Ambulatory Visit: Payer: Self-pay | Admitting: Internal Medicine

## 2019-02-28 DIAGNOSIS — M545 Low back pain, unspecified: Secondary | ICD-10-CM

## 2019-02-28 DIAGNOSIS — E1165 Type 2 diabetes mellitus with hyperglycemia: Secondary | ICD-10-CM | POA: Diagnosis not present

## 2019-02-28 DIAGNOSIS — I5032 Chronic diastolic (congestive) heart failure: Secondary | ICD-10-CM | POA: Diagnosis not present

## 2019-02-28 DIAGNOSIS — E782 Mixed hyperlipidemia: Secondary | ICD-10-CM | POA: Diagnosis not present

## 2019-02-28 DIAGNOSIS — I10 Essential (primary) hypertension: Secondary | ICD-10-CM | POA: Diagnosis not present

## 2019-03-01 ENCOUNTER — Ambulatory Visit (HOSPITAL_COMMUNITY)
Admission: RE | Admit: 2019-03-01 | Discharge: 2019-03-01 | Disposition: A | Discharge status: Home / Self Care | Payer: Medicare Other | Source: Ambulatory Visit | Attending: Internal Medicine | Admitting: Internal Medicine

## 2019-03-01 ENCOUNTER — Encounter: Payer: Self-pay | Admitting: Physician Assistant

## 2019-03-01 ENCOUNTER — Other Ambulatory Visit: Payer: Self-pay

## 2019-03-01 ENCOUNTER — Ambulatory Visit: Payer: Medicare Other | Admitting: Physician Assistant

## 2019-03-01 VITALS — Ht 73.0 in | Wt 157.0 lb

## 2019-03-01 DIAGNOSIS — M545 Low back pain, unspecified: Secondary | ICD-10-CM

## 2019-03-01 DIAGNOSIS — L97421 Non-pressure chronic ulcer of left heel and midfoot limited to breakdown of skin: Secondary | ICD-10-CM

## 2019-03-01 DIAGNOSIS — E1142 Type 2 diabetes mellitus with diabetic polyneuropathy: Secondary | ICD-10-CM

## 2019-03-01 DIAGNOSIS — Z89511 Acquired absence of right leg below knee: Secondary | ICD-10-CM | POA: Diagnosis not present

## 2019-03-01 DIAGNOSIS — Z89432 Acquired absence of left foot: Secondary | ICD-10-CM | POA: Diagnosis not present

## 2019-03-01 DIAGNOSIS — Z794 Long term (current) use of insulin: Secondary | ICD-10-CM

## 2019-03-01 DIAGNOSIS — S88111A Complete traumatic amputation at level between knee and ankle, right lower leg, initial encounter: Secondary | ICD-10-CM

## 2019-03-01 DIAGNOSIS — M5136 Other intervertebral disc degeneration, lumbar region: Secondary | ICD-10-CM | POA: Diagnosis not present

## 2019-03-01 MED ORDER — DOXYCYCLINE HYCLATE 100 MG PO CAPS: 100.0000 mg | ORAL_CAPSULE | Freq: Two times a day (BID) | ORAL | 1 refills | Status: DC

## 2019-03-01 MED ORDER — PENTOXIFYLLINE ER 400 MG PO TBCR: 400.0000 mg | EXTENDED_RELEASE_TABLET | Freq: Three times a day (TID) | ORAL | 11 refills | Status: DC

## 2019-03-01 NOTE — Progress Notes (Signed)
Office Visit Note   Patient: Albert [name]           Date of Birth: Aug 27, 1961           MRN: [id number] Visit Date: 03/01/2019              Requested by: Celene Squibb, MD [place], [place] [address] PCP: Celene Squibb, MD  Chief Complaint  Patient presents with  . Left Foot - Routine Post Op    Left foot transmet ulcer f/u      HPI: The patient is a 58 yo gentleman who is seen for follow up of a Wagoner grade 1 ulcer over his left transmetatarsal amputation.  He has been using some iodosorb to the area over the distal residual foot. He reports increased odor and drainage over the past couple of weeks. He has difficulty getting into the office due to his wife's work schedule.  He is using a NTG patch to the left foot daily. He is not currently on antibiotics.    Assessment & Plan: Visit Diagnoses:  1. History of transmetatarsal amputation of left foot ([place])   2. Midfoot ulcer, left, limited to breakdown of skin ([place])   3. Type 2 diabetes mellitus with diabetic polyneuropathy, with long-term current use of insulin ([place])   4. Amputated below knee, right ([place])     Plan: After informed consent, the left foot plantar/distal ulceration was debrided with a #10 blade knife to healthy appearing tissue.  Start Doxycycline 100 mg BID, start Trental 400 mg TID. Continue NTG patch to left foot. Continue iodosorb to ulcer daily after washing with Dial soap and water. Continue compression sock.  Follow up in 2 weeks.   Follow-Up Instructions: Return in about 2 weeks (around 03/15/2019).   Ortho Exam  Patient is alert, oriented, no adenopathy, well-dressed, normal affect, normal respiratory effort. After informed consent, the left foot ulcer was debrided with a #10 Blade knife to healthy bleeding tissue and hemostasis was achieved with silver nitrate. He has palpable pedal pulses. He has a NTG patch in place. There are no signs of cellulitis of the foot, but there is some  odor and tan drainage on the dressings . The ulcer is ~ 2 x 1 x 0.1 cm. No visible bone or tendon and no deep tracking of the ulcer.     Imaging: No results found. No images are attached to the encounter.  Labs: Lab Results  Component Value Date   HGBA1C 7.5 (H) 12/29/2018   HGBA1C 7.7 (H) 11/10/2017   HGBA1C 9.1 (H) 02/08/2016   ESRSEDRATE 45 (H) 12/31/2018   CRP 10.0 (H) 12/31/2018   REPTSTATUS 01/03/2019 FINAL 12/29/2018   CULT  12/29/2018    NO GROWTH 5 DAYS Performed at [facility], [address] [place], [place] [address]    [place] 06/26/2013     Lab Results  Component Value Date   ALBUMIN 3.1 (L) 05/17/2015   ALBUMIN 4.0 10/31/2014   ALBUMIN 4.1 08/28/2014    Body mass index is 20.71 kg/m.  Orders:  No orders of the defined types were placed in this encounter.  Meds ordered this encounter  Medications  . pentoxifylline (TRENTAL) 400 MG CR tablet    Sig: Take 1 tablet (400 mg total) by mouth 3 (three) times daily with meals.    Dispense:  90 tablet    Refill:  11  . doxycycline (VIBRAMYCIN) 100 MG capsule  Sig: Take 1 capsule (100 mg total) by mouth 2 (two) times daily.    Dispense:  28 capsule    Refill:  1     Procedures: No procedures performed  Clinical Data: No additional findings.  ROS:  All other systems negative, except as noted in the HPI. Review of Systems  Objective: Vital Signs: Ht 6\' 1"  (1.854 m)   Wt 157 lb (71.2 kg)   BMI 20.71 kg/m   Specialty Comments:  No specialty comments available.  PMFS History: Patient Active Problem List   Diagnosis Date Noted  . Cellulitis of left lower extremity   . Cellulitis in diabetic foot ([place]) 12/29/2018  . Sebaceous cyst   . Amputated below knee, right ([place]) 10/19/2017  . History of transmetatarsal amputation of left foot ([place]) 06/12/2017  . Special screening for malignant neoplasms, colon   . Midfoot ulcer, left, limited to breakdown of skin ([place])  11/18/2016  . ARF (acute renal failure) ([place]) 02/08/2016  . Chronic combined systolic and diastolic CHF (congestive heart failure) ([place]) 02/08/2016  . Splenic infarct 01/06/2014  . Acute systolic CHF (congestive heart failure) ([place]) 08/03/2013  . Hx of right BKA ([place]) 07/26/2013  . Anemia 07/15/2013  . Hyperkalemia 07/15/2013  . Hyposmolality and/or hyponatremia 07/15/2013  . Acute respiratory failure ([place]) 07/15/2013  . Mitral regurgitation 07/15/2013  . Oliguria 07/15/2013  . Bacteremia due to Staphylococcus aureus 07/15/2013  . Hyperchloremia 07/15/2013  . Acute kidney failure ([place]) 07/14/2013  . Hypotension, unspecified 07/14/2013  . Acute systolic heart failure ([place]) 07/12/2013  . Acute pulmonary edema ([place]) 07/11/2013  . Pneumonia, organism unspecified(486) 07/11/2013  . Hypoxemia 07/11/2013  . Cardiomyopathy, ischemic 07/03/2013  . Coronary atherosclerosis of native coronary artery 07/01/2013  . NSTEMI (non-ST elevated myocardial infarction) ([place]) 06/26/2013  . AKI (acute kidney injury) ([place]) 06/25/2013  . Shock circulatory ([place]) 06/25/2013  . Dehydration with hyponatremia 06/25/2013  . Leukocytosis 06/25/2013  . Sepsis(995.91) 06/25/2013  . Aftercare following surgery of the circulatory system, [place] 02/11/2013  . Peripheral vascular disease, unspecified ([place]) 12/31/2012  . Diabetes mellitus out of control ([place]) 12/17/2012  . Essential hypertension, benign 12/17/2012  . Atherosclerotic peripheral vascular disease ([place]) 12/17/2012  . Hypercholesteremia    Past Medical History:  Diagnosis Date  . Allergic rhinitis   . Anemia    takes supplement  . Chronic cough   . Chronic sinusitis   . Complication of anesthesia    couldn't swallow and talk  . Coronary artery disease   . CVA (cerebral infarction) 2011   R hand deficit  . Diabetes mellitus without complication ([place]) ~0109   last HbA1c ~9 .... fasting 160s  . GERD (gastroesophageal reflux disease)   . Heart murmur    . Hypercholesteremia   . Hypertension   . Myocardial infarction ([place]) 2014  . Osteomyelitis ([place])    first and third metatarsal  . Pneumonia    hx of walking  . Splenic infarct    setting of lupus anticoagulant  . Stroke [facility]) 2011   right arm weakness    Family History  Problem Relation Age of Onset  . Pancreatic cancer Mother   . Diabetes Mother   . Hyperlipidemia Mother   . Diabetes Other   . Breast cancer Sister   . Depression Maternal Grandmother   . Heart disease Maternal Grandmother   . Depression Maternal Grandfather   . Heart disease Maternal Grandfather   . Depression Paternal Grandmother   . Heart disease Paternal Grandmother   .  Depression Paternal Grandfather   . Heart disease Paternal Grandfather     Past Surgical History:  Procedure Laterality Date  . 4th Toe Amputation Left Jan. 2014   4th toe in [place]  . AMPUTATION Left 12/08/2012   Procedure: AMPUTATION DIGIT;  Surgeon: Mcarthur Rossetti, MD;  Location: [place];  Service: Orthopedics;  Laterality: Left;  3rd toe amputation possible 5th toe amputation  . AMPUTATION Left 12/11/2012   Procedure: Amputation of 2nd Toe;  Surgeon: Mcarthur Rossetti, MD;  Location: [place];  Service: Orthopedics;  Laterality: Left;  . AMPUTATION Right 06/29/2013   Procedure: AMPUTATION BELOW KNEE;  Surgeon: Newt Minion, MD;  Location: [place];  Service: Orthopedics;  Laterality: Right;  . AMPUTATION Left 08/29/2014   Procedure: Left Great Toe Amputation at MTP;  Surgeon: Newt Minion, MD;  Location: [place];  Service: Orthopedics;  Laterality: Left;  . AMPUTATION Left 10/31/2014   Procedure: Midfoot Amputation;  Surgeon: Newt Minion, MD;  Location: [place];  Service: Orthopedics;  Laterality: Left;  . BYPASS GRAFT POPLITEAL TO TIBIAL Left 12/13/2012   Procedure: BYPASS GRAFT POPLITEAL TO TIBIAL;  Surgeon: Serafina Mitchell, MD;  [facility];  Service: Vascular;  Laterality: Left;  Left Popliteal to Posterior Tibial Bypass  Graft with reversed saphenous vein graft  . CARDIAC CATHETERIZATION    . COLONOSCOPY [name] 04/14/2017   Procedure: COLONOSCOPY;  Surgeon: Danie Binder, MD;  [facility];  Service: Endoscopy;  Laterality: [name];  1000  . I&D EXTREMITY Left 12/08/2012   Procedure: IRRIGATION AND DEBRIDEMENT EXTREMITY;  Surgeon: Mcarthur Rossetti, MD;  Location: [place];  Service: Orthopedics;  Laterality: Left;  . I&D EXTREMITY Left 12/11/2012   Procedure: REPEAT I&D LEFT FOOT;  Surgeon: Mcarthur Rossetti, MD;  Location: [place];  Service: Orthopedics;  Laterality: Left;  . INCISION AND DRAINAGE Right 06/25/2013   Procedure: INCISION AND DRAINAGE RIGHT FOOT;  Surgeon: Mcarthur Rossetti, MD;  [facility];  Service: Orthopedics;  Laterality: Right;  . LEFT HEART CATHETERIZATION WITH CORONARY ANGIOGRAM [name] 06/28/2013   Procedure: LEFT HEART CATHETERIZATION WITH CORONARY ANGIOGRAM;  Surgeon: Burnell Blanks, MD;  Location: [facility] CATH LAB;  Service: Cardiovascular;  Laterality: [name];  . MASS EXCISION [name] 11/15/2017   Procedure: EXCISION 4CM CYST ON NECK;  Surgeon: Virl Cagey, MD;  Location[facility];  Service: General;  Laterality: [name];  . PENILE PROSTHESIS IMPLANT    . POLYPECTOMY  04/14/2017   Procedure: POLYPECTOMY;  Surgeon: Danie Binder, MD;  [facility];  Service: Endoscopy;;  colon  . STUMP REVISION Right 08/16/2013   Procedure: STUMP REVISION- right Below Knee Amputation;  Surgeon: Newt Minion, MD;  Location: [place];  Service: Orthopedics;  Laterality: Right;  . TEE WITHOUT CARDIOVERSION [name] 07/03/2013   Procedure: TRANSESOPHAGEAL ECHOCARDIOGRAM (TEE);  Surgeon: Larey Dresser, MD;  Location: [place];  Service: Cardiovascular;  Laterality: [name];  . TONSILLECTOMY AND ADENOIDECTOMY     Social History   Occupational History  . 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 Smoking status: Never  Smoker  . Smokeless tobacco: Never Used  Substance and Sexual Activity  . Alcohol use: No    Alcohol/week: 0.0 standard drinks  . Drug use: No  . Sexual activity: Never

## 2019-03-18 ENCOUNTER — Ambulatory Visit: Payer: Medicare Other | Admitting: Orthopedic Surgery

## 2019-03-26 ENCOUNTER — Encounter: Payer: Self-pay | Admitting: Orthopedic Surgery

## 2019-03-26 ENCOUNTER — Ambulatory Visit (INDEPENDENT_AMBULATORY_CARE_PROVIDER_SITE_OTHER): Payer: Medicare Other | Admitting: Orthopedic Surgery

## 2019-03-26 ENCOUNTER — Other Ambulatory Visit: Payer: Self-pay

## 2019-03-26 VITALS — Ht 73.0 in | Wt 157.0 lb

## 2019-03-26 DIAGNOSIS — L97421 Non-pressure chronic ulcer of left heel and midfoot limited to breakdown of skin: Secondary | ICD-10-CM

## 2019-03-26 NOTE — Progress Notes (Signed)
Office Visit Note   Patient: Albert [name]           Date of Birth: 1961/07/22           MRN: [id number] Visit Date: 03/26/2019              Requested by: Celene Squibb, MD [place], [place] [address] PCP: Celene Squibb, MD  Chief Complaint  Patient presents with  . Left Foot - Follow-up      HPI: The patient is a 58 yo gentleman who is seen for follow up of a Wagoner grade 1 ulcer over his left transmetatarsal amputation.  He has been using some iodosorb to the area over the distal residual foot. No concerns voiced. Using trental. He is using a NTG patch to the left foot daily. Completed doxycycline.   Assessment & Plan: Visit Diagnoses:  1. Midfoot ulcer, left, limited to breakdown of skin ([place])     Plan: After informed consent, the left foot plantar/distal ulceration was debrided with a #10 blade knife to healthy appearing tissue.  May discontinue the nitroglycerin patch. Continue close monitoring of foot. Follow up in office as needed.  Follow-Up Instructions: Return if symptoms worsen or fail to improve.   Ortho Exam  Patient is alert, oriented, no adenopathy, well-dressed, normal affect, normal respiratory effort. After informed consent, the left foot ulcer was debrided with a #10 Blade knife to healthy tissue and hemostasis was achieved with silver nitrate. He has palpable pedal pulses. He has a NTG patch in place. There are no signs of cellulitis of the foot, but there is some odor and tan drainage on the dressings . No open wound. No drainage. No erythema. No sign of infection.   Imaging: No results found. No images are attached to the encounter.  Labs: Lab Results  Component Value Date   HGBA1C 7.5 (H) 12/29/2018   HGBA1C 7.7 (H) 11/10/2017   HGBA1C 9.1 (H) 02/08/2016   ESRSEDRATE 45 (H) 12/31/2018   CRP 10.0 (H) 12/31/2018   REPTSTATUS 01/03/2019 FINAL 12/29/2018   CULT  12/29/2018    NO GROWTH 5 DAYS Performed at [facility],  [address], [place], [place] [address]    [place] 06/26/2013     Lab Results  Component Value Date   ALBUMIN 3.1 (L) 05/17/2015   ALBUMIN 4.0 10/31/2014   ALBUMIN 4.1 08/28/2014    Body mass index is 20.71 kg/m.  Orders:  No orders of the defined types were placed in this encounter.  No orders of the defined types were placed in this encounter.    Procedures: No procedures performed  Clinical Data: No additional findings.  ROS:  All other systems negative, except as noted in the HPI. Review of Systems  Constitutional: Negative for chills and fever.  Skin: Negative for color change and wound.    Objective: Vital Signs: Ht 6\' 1"  (1.854 m)   Wt 157 lb (71.2 kg)   BMI 20.71 kg/m   Specialty Comments:  No specialty comments available.  PMFS History: Patient Active Problem List   Diagnosis Date Noted  . Cellulitis of left lower extremity   . Cellulitis in diabetic foot ([place]) 12/29/2018  . Sebaceous cyst   . Amputated below knee, right ([place]) 10/19/2017  . History of transmetatarsal amputation of left foot ([place]) 06/12/2017  . Special screening for malignant neoplasms, colon   . Midfoot ulcer, left, limited to breakdown of skin ([place]) 11/18/2016  .  ARF (acute renal failure) ([place]) 02/08/2016  . Chronic combined systolic and diastolic CHF (congestive heart failure) ([place]) 02/08/2016  . Splenic infarct 01/06/2014  . Acute systolic CHF (congestive heart failure) ([place]) 08/03/2013  . Hx of right BKA ([place]) 07/26/2013  . Anemia 07/15/2013  . Hyperkalemia 07/15/2013  . Hyposmolality and/or hyponatremia 07/15/2013  . Acute respiratory failure ([place]) 07/15/2013  . Mitral regurgitation 07/15/2013  . Oliguria 07/15/2013  . Bacteremia due to Staphylococcus aureus 07/15/2013  . Hyperchloremia 07/15/2013  . Acute kidney failure ([place]) 07/14/2013  . Hypotension, unspecified 07/14/2013  . Acute systolic heart failure ([place]) 07/12/2013  . Acute pulmonary edema  ([place]) 07/11/2013  . Pneumonia, organism unspecified(486) 07/11/2013  . Hypoxemia 07/11/2013  . Cardiomyopathy, ischemic 07/03/2013  . Coronary atherosclerosis of native coronary artery 07/01/2013  . NSTEMI (non-ST elevated myocardial infarction) ([place]) 06/26/2013  . AKI (acute kidney injury) ([place]) 06/25/2013  . Shock circulatory ([place]) 06/25/2013  . Dehydration with hyponatremia 06/25/2013  . Leukocytosis 06/25/2013  . Sepsis(995.91) 06/25/2013  . Aftercare following surgery of the circulatory system, [place] 02/11/2013  . Peripheral vascular disease, unspecified ([place]) 12/31/2012  . Diabetes mellitus out of control ([place]) 12/17/2012  . Essential hypertension, benign 12/17/2012  . Atherosclerotic peripheral vascular disease ([place]) 12/17/2012  . Hypercholesteremia    Past Medical History:  Diagnosis Date  . Allergic rhinitis   . Anemia    takes supplement  . Chronic cough   . Chronic sinusitis   . Complication of anesthesia    couldn't swallow and talk  . Coronary artery disease   . CVA (cerebral infarction) 2011   R hand deficit  . Diabetes mellitus without complication ([place]) ~2355   last HbA1c ~9 .... fasting 160s  . GERD (gastroesophageal reflux disease)   . Heart murmur   . Hypercholesteremia   . Hypertension   . Myocardial infarction ([place]) 2014  . Osteomyelitis ([place])    first and third metatarsal  . Pneumonia    hx of walking  . Splenic infarct    setting of lupus anticoagulant  . Stroke [facility]) 2011   right arm weakness    Family History  Problem Relation Age of Onset  . Pancreatic cancer Mother   . Diabetes Mother   . Hyperlipidemia Mother   . Diabetes Other   . Breast cancer Sister   . Depression Maternal Grandmother   . Heart disease Maternal Grandmother   . Depression Maternal Grandfather   . Heart disease Maternal Grandfather   . Depression Paternal Grandmother   . Heart disease Paternal Grandmother   . Depression Paternal Grandfather   . Heart disease  Paternal Grandfather     Past Surgical History:  Procedure Laterality Date  . 4th Toe Amputation Left Jan. 2014   4th toe in [place]  . AMPUTATION Left 12/08/2012   Procedure: AMPUTATION DIGIT;  Surgeon: Mcarthur Rossetti, MD;  Location: [place];  Service: Orthopedics;  Laterality: Left;  3rd toe amputation possible 5th toe amputation  . AMPUTATION Left 12/11/2012   Procedure: Amputation of 2nd Toe;  Surgeon: Mcarthur Rossetti, MD;  Location: [place];  Service: Orthopedics;  Laterality: Left;  . AMPUTATION Right 06/29/2013   Procedure: AMPUTATION BELOW KNEE;  Surgeon: Newt Minion, MD;  Location: [place];  Service: Orthopedics;  Laterality: Right;  . AMPUTATION Left 08/29/2014   Procedure: Left Great Toe Amputation at MTP;  Surgeon: Newt Minion, MD;  Location: [place];  Service: Orthopedics;  Laterality: Left;  . AMPUTATION Left  10/31/2014   Procedure: Midfoot Amputation;  Surgeon: Newt Minion, MD;  Location: [place];  Service: Orthopedics;  Laterality: Left;  . BYPASS GRAFT POPLITEAL TO TIBIAL Left 12/13/2012   Procedure: BYPASS GRAFT POPLITEAL TO TIBIAL;  Surgeon: Serafina Mitchell, MD;  [facility];  Service: Vascular;  Laterality: Left;  Left Popliteal to Posterior Tibial Bypass Graft with reversed saphenous vein graft  . CARDIAC CATHETERIZATION    . COLONOSCOPY [name] 04/14/2017   Procedure: COLONOSCOPY;  Surgeon: Danie Binder, MD;  [facility];  Service: Endoscopy;  Laterality: [name];  1000  . I&D EXTREMITY Left 12/08/2012   Procedure: IRRIGATION AND DEBRIDEMENT EXTREMITY;  Surgeon: Mcarthur Rossetti, MD;  Location: [place];  Service: Orthopedics;  Laterality: Left;  . I&D EXTREMITY Left 12/11/2012   Procedure: REPEAT I&D LEFT FOOT;  Surgeon: Mcarthur Rossetti, MD;  Location: [place];  Service: Orthopedics;  Laterality: Left;  . INCISION AND DRAINAGE Right 06/25/2013   Procedure: INCISION AND DRAINAGE RIGHT FOOT;  Surgeon: Mcarthur Rossetti, MD;  [facility];   Service: Orthopedics;  Laterality: Right;  . LEFT HEART CATHETERIZATION WITH CORONARY ANGIOGRAM [name] 06/28/2013   Procedure: LEFT HEART CATHETERIZATION WITH CORONARY ANGIOGRAM;  Surgeon: Burnell Blanks, MD;  Location: [facility] CATH LAB;  Service: Cardiovascular;  Laterality: [name];  . MASS EXCISION [name] 11/15/2017   Procedure: EXCISION 4CM CYST ON NECK;  Surgeon: Virl Cagey, MD;  Location: [facility];  Service: General;  Laterality: [name];  . PENILE PROSTHESIS IMPLANT    . POLYPECTOMY  04/14/2017   Procedure: POLYPECTOMY;  Surgeon: Danie Binder, MD;  [facility];  Service: Endoscopy;;  colon  . STUMP REVISION Right 08/16/2013   Procedure: STUMP REVISION- right Below Knee Amputation;  Surgeon: Newt Minion, MD;  Location: [place];  Service: Orthopedics;  Laterality: Right;  . TEE WITHOUT CARDIOVERSION [name] 07/03/2013   Procedure: TRANSESOPHAGEAL ECHOCARDIOGRAM (TEE);  Surgeon: Larey Dresser, MD;  Location: [place];  Service: Cardiovascular;  Laterality: [name];  . TONSILLECTOMY AND ADENOIDECTOMY     Social History   Occupational History  . 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 Smoking status: Never Smoker  . Smokeless tobacco: Never Used  Substance and Sexual Activity  . Alcohol use: No    Alcohol/week: 0.0 standard drinks  . Drug use: No  . Sexual activity: Never

## 2019-03-28 ENCOUNTER — Other Ambulatory Visit: Payer: Self-pay

## 2019-03-28 NOTE — Patient Outreach (Signed)
[facility]) Care Management  03/28/2019  Albert [name] Feb 02, 1961 [id number]   Medication Adherence call to Mr. Shawne Eskelson Hippa Identifiers Verify spoke with patient he is past due on Atorvastatin 80 mg patient explain he is only taking 1 tablet every other day per doctor new instructions and has plenty at this time. Mr. Fulford is showing past due under [place].  [place] Management Direct Dial [phone]  Fax [phone] [email or web address]

## 2019-04-05 DIAGNOSIS — I5032 Chronic diastolic (congestive) heart failure: Secondary | ICD-10-CM | POA: Diagnosis not present

## 2019-04-05 DIAGNOSIS — I10 Essential (primary) hypertension: Secondary | ICD-10-CM | POA: Diagnosis not present

## 2019-04-05 DIAGNOSIS — E782 Mixed hyperlipidemia: Secondary | ICD-10-CM | POA: Diagnosis not present

## 2019-04-05 DIAGNOSIS — E1165 Type 2 diabetes mellitus with hyperglycemia: Secondary | ICD-10-CM | POA: Diagnosis not present

## 2019-04-08 ENCOUNTER — Ambulatory Visit (INDEPENDENT_AMBULATORY_CARE_PROVIDER_SITE_OTHER): Payer: Medicare Other | Admitting: Cardiology

## 2019-04-08 ENCOUNTER — Encounter: Payer: Self-pay | Admitting: Cardiology

## 2019-04-08 ENCOUNTER — Encounter: Payer: Self-pay | Admitting: *Deleted

## 2019-04-08 ENCOUNTER — Other Ambulatory Visit: Payer: Self-pay

## 2019-04-08 VITALS — BP 94/64 | HR 62 | Ht 71.0 in | Wt 268.0 lb

## 2019-04-08 DIAGNOSIS — E782 Mixed hyperlipidemia: Secondary | ICD-10-CM

## 2019-04-08 DIAGNOSIS — I2510 Atherosclerotic heart disease of native coronary artery without angina pectoris: Secondary | ICD-10-CM

## 2019-04-08 DIAGNOSIS — I5022 Chronic systolic (congestive) heart failure: Secondary | ICD-10-CM | POA: Diagnosis not present

## 2019-04-08 DIAGNOSIS — I10 Essential (primary) hypertension: Secondary | ICD-10-CM

## 2019-04-08 NOTE — Patient Instructions (Signed)

## 2019-04-08 NOTE — Progress Notes (Signed)
Clinical Summary Albert [name] is a 58 y.o.male seen today for follow up of the following medical problems.   1. Chronic combined systolic/diastolic heart failure - ICM, LVEF 40-45% by echo 12/2013, grade II diastolic dysfunction - 02/3613 LVEF 43%, grade I diastolic dysfunction    - no recent SOB/DOE. No recent edema.  - compliant with meds  2. CAD - hx of demand ischemia 06/2013 in setting of sepsis with necrotic foot wound. - cath at that time 06/2013 LM patent, LAD 40% prox, 70% mid follow by 60% lesion, distal 90%. Diag 99%. LCX 80-90% prox, OM1 80%, OM2 99%, OM3 99 % (all OMs are small), RCA small non-dom mid 99%. LVEF 40% by LVgram.  - CAD was managed medically after discussions with CT surgery at that time, with plans to reevaluate once recurring infection issues resolved.   - no recent chest pani.   4. PAD - followed by vascular and [facility] - previous right BKA, previous multiple toe amputations. 08/30/14 treated for osteo and ulcer of left great toe with amputation. Jan 2016 had left midfoot amputation.  - Followed by ortho Dr Sharol Given.     5. Hyperlipidemia 07/2017 TC 88 TG 85 LDL 43 HDL 28   - upcoming labs with pcp - compliant with statin  6. HTN -he is compliant with meds  7. Carotid stenosis - 03/2015 mild bilateraldisease    SH: recently married, honeymoon in [place]   Past Medical History:  Diagnosis Date  . Allergic rhinitis   . Anemia    takes supplement  . Chronic cough   . Chronic sinusitis   . Complication of anesthesia    couldn't swallow and talk  . Coronary artery disease   . CVA (cerebral infarction) 2011   R hand deficit  . Diabetes mellitus without complication ([place]) ~1540   last HbA1c ~9 .... fasting 160s  . GERD (gastroesophageal reflux disease)   . Heart murmur   . Hypercholesteremia   . Hypertension   . Myocardial infarction ([place]) 2014  . Osteomyelitis ([place])    first and third metatarsal  .  Pneumonia    hx of walking  . Splenic infarct    setting of lupus anticoagulant  . Stroke [facility]) 2011   right arm weakness     Allergies  Allergen Reactions  . Amoxicillin Nausea And Vomiting    Has patient had a PCN reaction causing immediate rash, facial/tongue/throat swelling, SOB or lightheadedness with hypotension: No Has patient had a PCN reaction causing severe rash involving mucus membranes or skin necrosis: No Has patient had a PCN reaction that required hospitalization: No Has patient had a PCN reaction occurring within the last 10 years: Yes If all of the above answers are "NO", then may proceed with Cephalosporin use.      Current Outpatient Medications  Medication Sig Dispense Refill  . aspirin EC 81 MG tablet Take 1 tablet (81 mg total) by mouth daily. 30 tablet 1  . atorvastatin (LIPITOR) 80 MG tablet Take 80 mg by mouth daily.    . benzonatate (TESSALON) 100 MG capsule Take 1 capsule (100 mg total) by mouth every 8 (eight) hours. 21 capsule 0  . carvedilol (COREG) 25 MG tablet Take 25 mg by mouth 2 (two) times daily.     . cephALEXin (KEFLEX) 500 MG capsule Take 1 capsule (500 mg total) by mouth every 6 (six) hours. 16 capsule 0  . doxycycline (VIBRAMYCIN) 100 MG capsule Take 1  capsule (100 mg total) by mouth 2 (two) times daily. 28 capsule 1  . furosemide (LASIX) 80 MG tablet Take 80 mg by mouth daily. May take additional 40 mg as needed for weight gain    . HUMALOG KWIKPEN 100 UNIT/ML KiwkPen Inject 24 Units into the skin 3 (three) times daily before meals.     . hydrALAZINE (APRESOLINE) 10 MG tablet Take 1 tablet (10 mg total) by mouth 3 (three) times daily. 120 tablet 1  . losartan (COZAAR) 50 MG tablet TAKE 1 TABLET BY MOUTH  DAILY 90 tablet 0  . mupirocin ointment (BACTROBAN) 2 % Apply 1 application topically daily. (Patient taking differently: Apply 1 application topically once a week. Apply to left foot) 22 g 6  . nitroGLYCERIN (NITRODUR - DOSED IN MG/24 HR)  0.2 mg/hr patch Place 0.2 mg onto the skin once a week. Applies to foot/ankle area to increase blood flow    . pantoprazole (PROTONIX) 40 MG tablet Take 1 tablet (40 mg total) by mouth daily. 30 tablet 1  . pentoxifylline (TRENTAL) 400 MG CR tablet Take 1 tablet (400 mg total) by mouth 3 (three) times daily with meals. 90 tablet 11  . potassium chloride (K-DUR,KLOR-CON) 10 MEQ tablet Take 1 tablet (10 mEq total) by mouth daily. 30 tablet 6  . senna-docusate (SENOKOT-S) 8.6-50 MG per tablet Take 1 tablet by mouth daily as needed for mild constipation.     . TRESIBA FLEXTOUCH 100 UNIT/ML SOPN FlexTouch Pen Inject 57 Units into the skin daily.  6  . TRUVADA 200-300 MG tablet Take 1 tablet by mouth daily.  5   No current facility-administered medications for this visit.      Past Surgical History:  Procedure Laterality Date  . 4th Toe Amputation Left Jan. 2014   4th toe in [place]  . AMPUTATION Left 12/08/2012   Procedure: AMPUTATION DIGIT;  Surgeon: Mcarthur Rossetti, MD;  Location: [place];  Service: Orthopedics;  Laterality: Left;  3rd toe amputation possible 5th toe amputation  . AMPUTATION Left 12/11/2012   Procedure: Amputation of 2nd Toe;  Surgeon: Mcarthur Rossetti, MD;  Location: [place];  Service: Orthopedics;  Laterality: Left;  . AMPUTATION Right 06/29/2013   Procedure: AMPUTATION BELOW KNEE;  Surgeon: Newt Minion, MD;  Location: [place];  Service: Orthopedics;  Laterality: Right;  . AMPUTATION Left 08/29/2014   Procedure: Left Great Toe Amputation at MTP;  Surgeon: Newt Minion, MD;  Location: [place];  Service: Orthopedics;  Laterality: Left;  . AMPUTATION Left 10/31/2014   Procedure: Midfoot Amputation;  Surgeon: Newt Minion, MD;  Location: [place];  Service: Orthopedics;  Laterality: Left;  . BYPASS GRAFT POPLITEAL TO TIBIAL Left 12/13/2012   Procedure: BYPASS GRAFT POPLITEAL TO TIBIAL;  Surgeon: Serafina Mitchell, MD;  [facility];  Service: Vascular;  Laterality: Left;  Left  Popliteal to Posterior Tibial Bypass Graft with reversed saphenous vein graft  . CARDIAC CATHETERIZATION    . COLONOSCOPY [name] 04/14/2017   Procedure: COLONOSCOPY;  Surgeon: Danie Binder, MD;  [facility];  Service: Endoscopy;  Laterality: [name];  1000  . I&D EXTREMITY Left 12/08/2012   Procedure: IRRIGATION AND DEBRIDEMENT EXTREMITY;  Surgeon: Mcarthur Rossetti, MD;  Location: [place];  Service: Orthopedics;  Laterality: Left;  . I&D EXTREMITY Left 12/11/2012   Procedure: REPEAT I&D LEFT FOOT;  Surgeon: Mcarthur Rossetti, MD;  Location: [place];  Service: Orthopedics;  Laterality: Left;  . INCISION  AND DRAINAGE Right 06/25/2013   Procedure: INCISION AND DRAINAGE RIGHT FOOT;  Surgeon: Mcarthur Rossetti, MD;  [facility];  Service: Orthopedics;  Laterality: Right;  . LEFT HEART CATHETERIZATION WITH CORONARY ANGIOGRAM [name] 06/28/2013   Procedure: LEFT HEART CATHETERIZATION WITH CORONARY ANGIOGRAM;  Surgeon: Burnell Blanks, MD;  Location: [facility] CATH LAB;  Service: Cardiovascular;  Laterality: [name];  . MASS EXCISION [name] 11/15/2017   Procedure: EXCISION 4CM CYST ON NECK;  Surgeon: Virl Cagey, MD;  Location: [facility];  Service: General;  Laterality: [name];  . PENILE PROSTHESIS IMPLANT    . POLYPECTOMY  04/14/2017   Procedure: POLYPECTOMY;  Surgeon: Danie Binder, MD;  [facility];  Service: Endoscopy;;  colon  . STUMP REVISION Right 08/16/2013   Procedure: STUMP REVISION- right Below Knee Amputation;  Surgeon: Newt Minion, MD;  Location: [place];  Service: Orthopedics;  Laterality: Right;  . TEE WITHOUT CARDIOVERSION [name] 07/03/2013   Procedure: TRANSESOPHAGEAL ECHOCARDIOGRAM (TEE);  Surgeon: Larey Dresser, MD;  Location: [place];  Service: Cardiovascular;  Laterality: [name];  . TONSILLECTOMY AND ADENOIDECTOMY       Allergies  Allergen Reactions  . Amoxicillin Nausea And Vomiting    Has patient had a PCN reaction causing immediate rash,  facial/tongue/throat swelling, SOB or lightheadedness with hypotension: No Has patient had a PCN reaction causing severe rash involving mucus membranes or skin necrosis: No Has patient had a PCN reaction that required hospitalization: No Has patient had a PCN reaction occurring within the last 10 years: Yes If all of the above answers are "NO", then may proceed with Cephalosporin use.       Family History  Problem Relation Age of Onset  . Pancreatic cancer Mother   . Diabetes Mother   . Hyperlipidemia Mother   . Diabetes Other   . Breast cancer Sister   . Depression Maternal Grandmother   . Heart disease Maternal Grandmother   . Depression Maternal Grandfather   . Heart disease Maternal Grandfather   . Depression Paternal Grandmother   . Heart disease Paternal Grandmother   . Depression Paternal Grandfather   . Heart disease Paternal Grandfather      Social History Mr. Pavek reports that he has never smoked. He has never used smokeless tobacco. Mr. Moreland reports no history of alcohol use.   Review of Systems CONSTITUTIONAL: No weight loss, fever, chills, weakness or fatigue.  HEENT: Eyes: No visual loss, blurred vision, double vision or yellow sclerae.No hearing loss, sneezing, congestion, runny nose or sore throat.  SKIN: No rash or itching.  CARDIOVASCULAR: per hpi RESPIRATORY: No shortness of breath, cough or sputum.  GASTROINTESTINAL: No anorexia, nausea, vomiting or diarrhea. No abdominal pain or blood.  GENITOURINARY: No burning on urination, no polyuria NEUROLOGICAL: No headache, dizziness, syncope, paralysis, ataxia, numbness or tingling in the extremities. No change in bowel or bladder control.  MUSCULOSKELETAL: No muscle, back pain, joint pain or stiffness.  LYMPHATICS: No enlarged nodes. No history of splenectomy.  PSYCHIATRIC: No history of depression or anxiety.  ENDOCRINOLOGIC: No reports of sweating, cold or heat intolerance. No polyuria or polydipsia.  Marland Kitchen    Physical Examination Today's Vitals   04/08/19 1536  BP: 94/64  Pulse: 62  SpO2: 98%  Weight: 268 lb (121.6 kg)  Height: 5\' 11"  (1.803 m)   Body mass index is 37.38 kg/m.  Gen: resting comfortably, no acute distress HEENT: no scleral icterus, pupils equal round and reactive, no palptable cervical adenopathy,  CV: RRR, no m/r/g, no jvd Resp: Clear to auscultation bilaterally GI: abdomen is soft, non-tender, non-distended, normal bowel sounds, no hepatosplenomegaly MSK: extremities are warm, no edema.  Skin: warm, no rash Neuro:  no focal deficits Psych: appropriate affect   Diagnostic Studies 06/2013 Cath Hemodynamic Findings: Central aortic pressure: 90/61 Left ventricular pressure: 90/19/29  Angiographic Findings:  Left main: Short segment without obstruction.   Left Anterior Descending Artery: Moderate caliber diffusely diseased vessel that courses to the apex. The proximal vessel has diffuse 40% stenosis. The mid vessel has a 70% stenosis followed by a 60% stenosis. The distal vessel has diffuse 90% stenosis. The diagonal is a moderate caliber vessel with ostial 99% stenosis with 99% disease extending into the mid vessel.   Circumflex Artery: Large caliber vessel with proximal 80-90% stenosis. There are three small to moderate caliber obtuse marginal branches. The first OM has mid 80% stenosis. The second OM has a proximal 99% stenosis. The third OM has diffuse 99% stenosis. The left sided posterolateral Aki Burdin has 99% stenosis.   Right Coronary Artery: Small non-dominant vessel with mid 99% stenosis.   Left Ventricular Angiogram: LVEF=40%  Impression: 1. Triple vessel CAD 2. NSTEMI secondary to demand ischemia from sepsis with severe underlying CAD 3. Mild LV systolic dysfunction  Recommendations: Complex situation. The only option for coronary revascularization is CABG. He is not a favorable candidate for CABG at this time with necrotic right foot,  sepsis. Even though not ideal, would favor medical management of CAD while undergoing treatment of his necrotic foot. Would proceed with right BKA this weekend. I have discussed the case with CT surgery who agrees that CABG is not a good option at this time. Will ask CT surgery to see after he recovers from his BKA. Continue ASA, beta blocker, statin.    Complications:None. The patient tolerated the procedure well.     11/2012 Carotid US Summary:  - No significant extracranial carotid artery stenosis demonstrated. Vertebrals are patent with antegrade flow. - ICA/CCA ratio= right = 0.94. left = 0.73. Other specific details can be found in the table(s) above.  Prepared and Electronically Authenticated by  03/2015 Carotid US IMPRESSION: Mild atherosclerotic disease in the carotid arteries, right side greater the left. The peak systolic velocity in the proximal right internal carotid artery is slightly elevated. Estimated degree of stenosis in the right internal carotid artery is close to 50%.  Estimated degree of stenosis in the left internal carotid artery is less than 50%.  Patent vertebral arteries.   12/07/15 Clinic EKG (performed and reviewed in clinic): NSR  01/2017 echo Study Conclusions  - Left ventricle: The cavity size was normal. Wall thickness was at the upper limits of normal. The estimated ejection fraction was 50%. Doppler parameters are consistent with abnormal left ventricular relaxation (grade 1 diastolic dysfunction). - Aortic valve: Mildly calcified annulus. Trileaflet. - Mitral valve: Calcified annulus. Mildly thickened leaflets . There was trivial regurgitation. - Tricuspid valve: There was trivial regurgitation. Peak RV-RA gradient (S): 21 mm Hg. - Pulmonary arteries: Systolic pressure could not be accurately estimated. - Pericardium, extracardiac: A trivial pericardial effusion was identified.  Impressions:   - Overall limited images. Upper normal LV wall thickness with LVEF approximately 50% based on best views. Cannot exclude periapical wall motion abnormalities. Would suggest a limited study with Definity contrast. Calcified mitral annulus with mildly thickened leaflets and trivial mitral regurgitation. Trivial tricuspid regurgitation with RV-RA gradient 21 mmHg. Trivial pericardial effusion.    Assessment and  Plan  1. Chronic systolic heart failure -LVEF has normalized based on most recent echo - no recent symptoms, continue current meds  2. CAD -no recentsymptoms, continue current meds   3. Hyperlipidemia -continue statin   4. HTN - manual recheck 110/75, at goal - continue current meds   F/u 6 months. Request labs from pcp      Arnoldo Lenis, M.D

## 2019-04-16 DIAGNOSIS — E1165 Type 2 diabetes mellitus with hyperglycemia: Secondary | ICD-10-CM | POA: Diagnosis not present

## 2019-04-16 DIAGNOSIS — I10 Essential (primary) hypertension: Secondary | ICD-10-CM | POA: Diagnosis not present

## 2019-04-16 DIAGNOSIS — D509 Iron deficiency anemia, unspecified: Secondary | ICD-10-CM | POA: Diagnosis not present

## 2019-04-16 DIAGNOSIS — D649 Anemia, unspecified: Secondary | ICD-10-CM | POA: Diagnosis not present

## 2019-04-16 DIAGNOSIS — E782 Mixed hyperlipidemia: Secondary | ICD-10-CM | POA: Diagnosis not present

## 2019-04-16 DIAGNOSIS — N182 Chronic kidney disease, stage 2 (mild): Secondary | ICD-10-CM | POA: Diagnosis not present

## 2019-04-23 DIAGNOSIS — I2510 Atherosclerotic heart disease of native coronary artery without angina pectoris: Secondary | ICD-10-CM | POA: Diagnosis not present

## 2019-04-23 DIAGNOSIS — I10 Essential (primary) hypertension: Secondary | ICD-10-CM | POA: Diagnosis not present

## 2019-04-23 DIAGNOSIS — N182 Chronic kidney disease, stage 2 (mild): Secondary | ICD-10-CM | POA: Diagnosis not present

## 2019-04-23 DIAGNOSIS — E1165 Type 2 diabetes mellitus with hyperglycemia: Secondary | ICD-10-CM | POA: Diagnosis not present

## 2019-04-23 DIAGNOSIS — I5032 Chronic diastolic (congestive) heart failure: Secondary | ICD-10-CM | POA: Diagnosis not present

## 2019-04-29 ENCOUNTER — Other Ambulatory Visit: Payer: Self-pay

## 2019-04-29 DIAGNOSIS — E1165 Type 2 diabetes mellitus with hyperglycemia: Secondary | ICD-10-CM | POA: Diagnosis not present

## 2019-04-29 DIAGNOSIS — I5032 Chronic diastolic (congestive) heart failure: Secondary | ICD-10-CM | POA: Diagnosis not present

## 2019-04-29 DIAGNOSIS — I10 Essential (primary) hypertension: Secondary | ICD-10-CM | POA: Diagnosis not present

## 2019-04-29 DIAGNOSIS — E782 Mixed hyperlipidemia: Secondary | ICD-10-CM | POA: Diagnosis not present

## 2019-04-29 NOTE — Patient Outreach (Signed)
[facility]) Care Management  04/29/2019  Albert [name] [birth year] [id number]  Medication Adherence call to [name] HIPPA Compliant Voice message left with a call back number. Last month I spoke with patient he explain he is only taking 1 tablet every other day on Atorvastatin 80 mg. [name] is showing past due under [place].   [place] Management Direct Dial [phone]  Fax [phone] [email or web address]

## 2019-05-06 ENCOUNTER — Other Ambulatory Visit (INDEPENDENT_AMBULATORY_CARE_PROVIDER_SITE_OTHER): Payer: Self-pay | Admitting: Orthopedic Surgery

## 2019-06-06 DIAGNOSIS — E782 Mixed hyperlipidemia: Secondary | ICD-10-CM | POA: Diagnosis not present

## 2019-06-06 DIAGNOSIS — E1165 Type 2 diabetes mellitus with hyperglycemia: Secondary | ICD-10-CM | POA: Diagnosis not present

## 2019-06-06 DIAGNOSIS — I5032 Chronic diastolic (congestive) heart failure: Secondary | ICD-10-CM | POA: Diagnosis not present

## 2019-06-06 DIAGNOSIS — I10 Essential (primary) hypertension: Secondary | ICD-10-CM | POA: Diagnosis not present

## 2019-06-27 DIAGNOSIS — E782 Mixed hyperlipidemia: Secondary | ICD-10-CM | POA: Diagnosis not present

## 2019-06-27 DIAGNOSIS — E1165 Type 2 diabetes mellitus with hyperglycemia: Secondary | ICD-10-CM | POA: Diagnosis not present

## 2019-06-27 DIAGNOSIS — I10 Essential (primary) hypertension: Secondary | ICD-10-CM | POA: Diagnosis not present

## 2019-06-27 DIAGNOSIS — I5032 Chronic diastolic (congestive) heart failure: Secondary | ICD-10-CM | POA: Diagnosis not present

## 2019-07-18 DIAGNOSIS — I10 Essential (primary) hypertension: Secondary | ICD-10-CM | POA: Diagnosis not present

## 2019-07-18 DIAGNOSIS — E782 Mixed hyperlipidemia: Secondary | ICD-10-CM | POA: Diagnosis not present

## 2019-07-18 DIAGNOSIS — I5032 Chronic diastolic (congestive) heart failure: Secondary | ICD-10-CM | POA: Diagnosis not present

## 2019-07-18 DIAGNOSIS — E1165 Type 2 diabetes mellitus with hyperglycemia: Secondary | ICD-10-CM | POA: Diagnosis not present

## 2019-08-26 DIAGNOSIS — D509 Iron deficiency anemia, unspecified: Secondary | ICD-10-CM | POA: Diagnosis not present

## 2019-08-26 DIAGNOSIS — I10 Essential (primary) hypertension: Secondary | ICD-10-CM | POA: Diagnosis not present

## 2019-08-26 DIAGNOSIS — E1165 Type 2 diabetes mellitus with hyperglycemia: Secondary | ICD-10-CM | POA: Diagnosis not present

## 2019-08-26 DIAGNOSIS — E782 Mixed hyperlipidemia: Secondary | ICD-10-CM | POA: Diagnosis not present

## 2019-08-26 DIAGNOSIS — D649 Anemia, unspecified: Secondary | ICD-10-CM | POA: Diagnosis not present

## 2019-09-04 DIAGNOSIS — E1165 Type 2 diabetes mellitus with hyperglycemia: Secondary | ICD-10-CM | POA: Diagnosis not present

## 2019-09-04 DIAGNOSIS — Z0001 Encounter for general adult medical examination with abnormal findings: Secondary | ICD-10-CM | POA: Diagnosis not present

## 2019-09-04 DIAGNOSIS — I10 Essential (primary) hypertension: Secondary | ICD-10-CM | POA: Diagnosis not present

## 2019-09-04 DIAGNOSIS — I2510 Atherosclerotic heart disease of native coronary artery without angina pectoris: Secondary | ICD-10-CM | POA: Diagnosis not present

## 2019-09-04 DIAGNOSIS — N182 Chronic kidney disease, stage 2 (mild): Secondary | ICD-10-CM | POA: Diagnosis not present

## 2019-10-03 DIAGNOSIS — I130 Hypertensive heart and chronic kidney disease with heart failure and stage 1 through stage 4 chronic kidney disease, or unspecified chronic kidney disease: Secondary | ICD-10-CM | POA: Diagnosis not present

## 2019-10-03 DIAGNOSIS — N182 Chronic kidney disease, stage 2 (mild): Secondary | ICD-10-CM | POA: Diagnosis not present

## 2019-10-03 DIAGNOSIS — I5032 Chronic diastolic (congestive) heart failure: Secondary | ICD-10-CM | POA: Diagnosis not present

## 2019-10-03 DIAGNOSIS — I10 Essential (primary) hypertension: Secondary | ICD-10-CM | POA: Diagnosis not present

## 2019-10-03 DIAGNOSIS — E1165 Type 2 diabetes mellitus with hyperglycemia: Secondary | ICD-10-CM | POA: Diagnosis not present

## 2019-10-07 ENCOUNTER — Emergency Department (HOSPITAL_COMMUNITY)
Admission: EM | Admit: 2019-10-07 | Discharge: 2019-10-07 | Disposition: A | Discharge status: Home / Self Care | Payer: Medicare Other | Attending: Emergency Medicine | Admitting: Emergency Medicine

## 2019-10-07 ENCOUNTER — Other Ambulatory Visit: Payer: Self-pay

## 2019-10-07 ENCOUNTER — Encounter (HOSPITAL_COMMUNITY): Payer: Self-pay | Admitting: *Deleted

## 2019-10-07 DIAGNOSIS — Z79899 Other long term (current) drug therapy: Secondary | ICD-10-CM | POA: Insufficient documentation

## 2019-10-07 DIAGNOSIS — Z794 Long term (current) use of insulin: Secondary | ICD-10-CM | POA: Diagnosis not present

## 2019-10-07 DIAGNOSIS — N3001 Acute cystitis with hematuria: Secondary | ICD-10-CM | POA: Diagnosis not present

## 2019-10-07 DIAGNOSIS — I110 Hypertensive heart disease with heart failure: Secondary | ICD-10-CM | POA: Diagnosis not present

## 2019-10-07 DIAGNOSIS — I5042 Chronic combined systolic (congestive) and diastolic (congestive) heart failure: Secondary | ICD-10-CM | POA: Insufficient documentation

## 2019-10-07 DIAGNOSIS — E119 Type 2 diabetes mellitus without complications: Secondary | ICD-10-CM | POA: Diagnosis not present

## 2019-10-07 DIAGNOSIS — Z7982 Long term (current) use of aspirin: Secondary | ICD-10-CM | POA: Diagnosis not present

## 2019-10-07 DIAGNOSIS — R300 Dysuria: Secondary | ICD-10-CM | POA: Diagnosis present

## 2019-10-07 LAB — URINALYSIS, ROUTINE W REFLEX MICROSCOPIC
Glucose, UA: NEGATIVE mg/dL
Ketones, ur: NEGATIVE mg/dL
Nitrite: NEGATIVE
Protein, ur: >300 mg/dL — AB
Specific Gravity, Urine: 1.02 (ref 1.005–1.030)
pH: 6.5 (ref 5.0–8.0)

## 2019-10-07 LAB — URINALYSIS, MICROSCOPIC (REFLEX)

## 2019-10-07 MED ORDER — CEFTRIAXONE SODIUM 1 G IJ SOLR
1.0000 g | Freq: Once | INTRAMUSCULAR | Status: AC
  Administered 2019-10-07: 1 g via INTRAMUSCULAR
  Filled 2019-10-07: qty 10

## 2019-10-07 MED ORDER — CEPHALEXIN 500 MG PO CAPS: 500.0000 mg | ORAL_CAPSULE | Freq: Four times a day (QID) | ORAL | 0 refills | Status: DC

## 2019-10-07 MED ORDER — LIDOCAINE HCL (PF) 1 % IJ SOLN
INTRAMUSCULAR | Status: AC
  Filled 2019-10-07: qty 2

## 2019-10-07 NOTE — Discharge Instructions (Addendum)
Drink plenty of water.  Take the antibiotic as directed until its finished.  Follow-up with your primary doctor for recheck after you have completed the antibiotic.  You may take Tylenol 500 mg 1 every 4 hours if needed for pain or fever.  Return to the ER for any worsening symptoms such as increasing pain, high fever, or vomiting.

## 2019-10-07 NOTE — ED Provider Notes (Signed)
[facility] EMERGENCY DEPARTMENT Provider Note   CSN: [id number] Arrival date & time: 10/07/19  1924     History Chief Complaint  Patient presents with  . Dysuria    EWALD DUCKER is a 58 y.o. male.  HPI      HILERY WALDBILLIG is a 58 y.o. male who presents to the Emergency Department complaining of burning with urination and urinary hesitancy.  Symptoms began yesterday.  States this morning when he urinated his urine appeared normal yellow color, but this evening his urine has turned dark and bloody.  His symptoms have been associated with generalized fatigue, sweats, and chills.  He states that he feels like he has a urinary tract infection although he denies having previous ones.  No abdominal pain, nausea, vomiting, or flank pain.  no hx of kidney stones.  He states he has a monogamous relationship with his wife and denies pain to his testicles, penile discharge or swelling of the scrotum.  Past Medical History:  Diagnosis Date  . Allergic rhinitis   . Anemia    takes supplement  . Chronic cough   . Chronic sinusitis   . Complication of anesthesia    couldn't swallow and talk  . Coronary artery disease   . CVA (cerebral infarction) 2011   R hand deficit  . Diabetes mellitus without complication ([place]) [id number]   last HbA1c ~9 .... fasting 160s  . GERD (gastroesophageal reflux disease)   . Heart murmur   . Hypercholesteremia   . Hypertension   . Myocardial infarction ([place]) 2014  . Osteomyelitis ([place])    first and third metatarsal  . Pneumonia    hx of walking  . Splenic infarct    setting of lupus anticoagulant  . Stroke [facility]) 2011   right arm weakness    Patient Active Problem List   Diagnosis Date Noted  . Cellulitis of left lower extremity   . Cellulitis in diabetic foot ([place]) 12/29/2018  . Sebaceous cyst   . Amputated below knee, right ([place]) 10/19/2017  . History of transmetatarsal amputation of left foot ([place]) 06/12/2017  . Special screening for  malignant neoplasms, colon   . Midfoot ulcer, left, limited to breakdown of skin ([place]) 11/18/2016  . ARF (acute renal failure) ([place]) 02/08/2016  . Chronic combined systolic and diastolic CHF (congestive heart failure) ([place]) 02/08/2016  . Splenic infarct 01/06/2014  . Acute systolic CHF (congestive heart failure) ([place]) 08/03/2013  . Hx of right BKA ([place]) 07/26/2013  . Anemia 07/15/2013  . Hyperkalemia 07/15/2013  . Hyposmolality and/or hyponatremia 07/15/2013  . Acute respiratory failure ([place]) 07/15/2013  . Mitral regurgitation 07/15/2013  . Oliguria 07/15/2013  . Bacteremia due to Staphylococcus aureus 07/15/2013  . Hyperchloremia 07/15/2013  . Acute kidney failure ([place]) 07/14/2013  . Hypotension, unspecified 07/14/2013  . Acute systolic heart failure ([place]) 07/12/2013  . Acute pulmonary edema ([place]) 07/11/2013  . Pneumonia, organism unspecified(486) 07/11/2013  . Hypoxemia 07/11/2013  . Cardiomyopathy, ischemic 07/03/2013  . Coronary atherosclerosis of native coronary artery 07/01/2013  . NSTEMI (non-ST elevated myocardial infarction) ([place]) 06/26/2013  . AKI (acute kidney injury) ([place]) 06/25/2013  . Shock circulatory ([place]) 06/25/2013  . Dehydration with hyponatremia 06/25/2013  . Leukocytosis 06/25/2013  . Sepsis(995.91) 06/25/2013  . Aftercare following surgery of the circulatory system, [place] 02/11/2013  . Peripheral vascular disease, unspecified ([place]) 12/31/2012  . Diabetes mellitus out of control ([place]) 12/17/2012  . Essential hypertension, benign 12/17/2012  . Atherosclerotic peripheral vascular disease ([place]) 12/17/2012  .  Hypercholesteremia     Past Surgical History:  Procedure Laterality Date  . 4th Toe Amputation Left Jan. 2014   4th toe in [place]  . AMPUTATION Left 12/08/2012   Procedure: AMPUTATION DIGIT;  Surgeon: Mcarthur Rossetti, MD;  Location: [place];  Service: Orthopedics;  Laterality: Left;  3rd toe amputation possible 5th toe amputation  . AMPUTATION Left  12/11/2012   Procedure: Amputation of 2nd Toe;  Surgeon: Mcarthur Rossetti, MD;  Location: [place];  Service: Orthopedics;  Laterality: Left;  . AMPUTATION Right 06/29/2013   Procedure: AMPUTATION BELOW KNEE;  Surgeon: Newt Minion, MD;  Location: [place];  Service: Orthopedics;  Laterality: Right;  . AMPUTATION Left 08/29/2014   Procedure: Left Great Toe Amputation at MTP;  Surgeon: Newt Minion, MD;  Location: [place];  Service: Orthopedics;  Laterality: Left;  . AMPUTATION Left 10/31/2014   Procedure: Midfoot Amputation;  Surgeon: Newt Minion, MD;  Location: [place];  Service: Orthopedics;  Laterality: Left;  . BYPASS GRAFT POPLITEAL TO TIBIAL Left 12/13/2012   Procedure: BYPASS GRAFT POPLITEAL TO TIBIAL;  Surgeon: Serafina Mitchell, MD;  Location: [facility];  Service: Vascular;  Laterality: Left;  Left Popliteal to Posterior Tibial Bypass Graft with reversed saphenous vein graft  . CARDIAC CATHETERIZATION    . COLONOSCOPY [name] 04/14/2017   Procedure: COLONOSCOPY;  Surgeon: Danie Binder, MD;  [facility];  Service: Endoscopy;  Laterality: [name];  1000  . I & D EXTREMITY Left 12/08/2012   Procedure: IRRIGATION AND DEBRIDEMENT EXTREMITY;  Surgeon: Mcarthur Rossetti, MD;  Location: [place];  Service: Orthopedics;  Laterality: Left;  . I & D EXTREMITY Left 12/11/2012   Procedure: REPEAT I&D LEFT FOOT;  Surgeon: Mcarthur Rossetti, MD;  Location: [place];  Service: Orthopedics;  Laterality: Left;  . INCISION AND DRAINAGE Right 06/25/2013   Procedure: INCISION AND DRAINAGE RIGHT FOOT;  Surgeon: Mcarthur Rossetti, MD;  Location: [facility];  Service: Orthopedics;  Laterality: Right;  . LEFT HEART CATHETERIZATION WITH CORONARY ANGIOGRAM [name] 06/28/2013   Procedure: LEFT HEART CATHETERIZATION WITH CORONARY ANGIOGRAM;  Surgeon: Burnell Blanks, MD;  Location: [facility] CATH LAB;  Service: Cardiovascular;  Laterality: [name];  . MASS EXCISION [name] 11/15/2017   Procedure: EXCISION 4CM CYST ON NECK;   Surgeon: Virl Cagey, MD;  Location: [facility];  Service: General;  Laterality: [name];  . PENILE PROSTHESIS IMPLANT    . POLYPECTOMY  04/14/2017   Procedure: POLYPECTOMY;  Surgeon: Danie Binder, MD;  [facility];  Service: Endoscopy;;  colon  . STUMP REVISION Right 08/16/2013   Procedure: STUMP REVISION- right Below Knee Amputation;  Surgeon: Newt Minion, MD;  Location: [place];  Service: Orthopedics;  Laterality: Right;  . TEE WITHOUT CARDIOVERSION [name] 07/03/2013   Procedure: TRANSESOPHAGEAL ECHOCARDIOGRAM (TEE);  Surgeon: Larey Dresser, MD;  Location: [place];  Service: Cardiovascular;  Laterality: [name];  . TONSILLECTOMY AND ADENOIDECTOMY         Family History  Problem Relation Age of Onset  . Pancreatic cancer Mother   . Diabetes Mother   . Hyperlipidemia Mother   . Diabetes Other   . Breast cancer Sister   . Depression Maternal Grandmother   . Heart disease Maternal Grandmother   . Depression Maternal Grandfather   . Heart disease Maternal Grandfather   . Depression Paternal Grandmother   . Heart disease Paternal Grandmother   . Depression Paternal Grandfather   . Heart  disease Paternal Grandfather     Social History   Tobacco Use  . Smoking status: Never Smoker  . Smokeless tobacco: Never Used  Substance Use Topics  . Alcohol use: No    Alcohol/week: 0.0 standard drinks  . Drug use: No    Home Medications Prior to Admission medications   Medication Sig Start Date End Date Taking? Authorizing Provider  aspirin EC 81 MG tablet Take 1 tablet (81 mg total) by mouth daily. 01/09/14   Kathie Dike, MD  atorvastatin (LIPITOR) 80 MG tablet Take 80 mg by mouth daily.    [provider]  carvedilol (COREG) 25 MG tablet Take 25 mg by mouth 2 (two) times daily.     [provider]  furosemide (LASIX) 80 MG tablet Take 80 mg by mouth daily. May take additional 40 mg as needed for weight gain 08/08/13   Angiulli, Lavon Paganini, PA-C   hydrALAZINE (APRESOLINE) 10 MG tablet Take 1 tablet (10 mg total) by mouth 3 (three) times daily. 10/01/13   Burtis Junes, NP  losartan (COZAAR) 50 MG tablet TAKE 1 TABLET BY MOUTH  DAILY 12/03/18   Arnoldo Lenis, MD  metFORMIN (GLUCOPHAGE-XR) 500 MG 24 hr tablet Take 1 tablet by mouth daily. 01/29/19   [provider]  mupirocin ointment (BACTROBAN) 2 % Apply 1 application topically daily. Patient taking differently: Apply 1 application topically once a week. Apply to left foot 03/22/17   Suzan Slick, NP  nitroGLYCERIN (NITRODUR - DOSED IN MG/24 HR) 0.2 mg/hr patch APPLY 1 PATCH ONTO SKIN  DAILY 05/06/19   Newt Minion, MD  pantoprazole (PROTONIX) 40 MG tablet Take 1 tablet (40 mg total) by mouth daily. 08/08/13   Angiulli, Lavon Paganini, PA-C  pentoxifylline (TRENTAL) 400 MG CR tablet Take 1 tablet (400 mg total) by mouth 3 (three) times daily with meals. 03/01/19   Rayburn, Neta Mends, PA-C  potassium chloride (K-DUR,KLOR-CON) 10 MEQ tablet Take 1 tablet (10 mEq total) by mouth daily. 04/08/15   Arnoldo Lenis, MD  TRESIBA FLEXTOUCH 100 UNIT/ML SOPN FlexTouch Pen Inject 57 Units into the skin daily. 07/23/18   [provider]  TRUVADA 200-300 MG tablet Take 1 tablet by mouth daily. 07/20/18   [provider]    Allergies    Amoxicillin  Review of Systems   Review of Systems  Constitutional: Negative for activity change, appetite change, chills and fever.  Respiratory: Negative for chest tightness and shortness of breath.   Cardiovascular: Negative for chest pain.  Gastrointestinal: Negative for abdominal pain, nausea and vomiting.  Genitourinary: Positive for dysuria, frequency, hematuria and urgency. Negative for decreased urine volume, difficulty urinating, discharge, flank pain, penile swelling, scrotal swelling and testicular pain.  Musculoskeletal: Negative for back pain.  Skin: Negative for rash.  Neurological: Negative for dizziness, weakness  and numbness.  Hematological: Negative for adenopathy.  Psychiatric/Behavioral: Negative for confusion.    Physical Exam Updated Vital Signs BP (!) 138/95 (BP Location: Left Arm)   Pulse 98   Temp 99.3 F (37.4 C) (Oral)   Resp 16   Ht 5\' 11"  (1.803 m)   Wt 121.1 kg   SpO2 99%   BMI 37.24 kg/m   Physical Exam Vitals and nursing note reviewed.  Constitutional:      Appearance: Normal appearance. He is not ill-appearing or toxic-appearing.  Cardiovascular:     Rate and Rhythm: Normal rate and regular rhythm.     Pulses: Normal pulses.  Pulmonary:  Effort: Pulmonary effort is normal.     Breath sounds: Normal breath sounds.  Abdominal:     General: There is no distension.     Palpations: Abdomen is soft.     Tenderness: There is no abdominal tenderness. There is no right CVA tenderness or left CVA tenderness.  Musculoskeletal:     Cervical back: Normal range of motion.     Left lower leg: No edema.     Comments: Right BKA.  Patient is wearing a prosthesis.  Skin:    General: Skin is warm.     Findings: No rash.  Neurological:     Mental Status: He is alert.     Sensory: No sensory deficit.     Motor: No weakness.     Comments: CN II-XII grossly intact     ED Results / Procedures / Treatments   Labs (all labs ordered are listed, but only abnormal results are displayed) Labs Reviewed  URINALYSIS, ROUTINE W REFLEX MICROSCOPIC - Abnormal; Notable for the following components:      Result Value   Color, Urine AMBER (*)    APPearance CLOUDY (*)    Hgb urine dipstick LARGE (*)    Bilirubin Urine SMALL (*)    Protein, ur >300 (*)    Leukocytes,Ua MODERATE (*)    All other components within normal limits  URINALYSIS, MICROSCOPIC (REFLEX) - Abnormal; Notable for the following components:   Bacteria, UA RARE (*)    All other components within normal limits  URINE CULTURE  GC/CHLAMYDIA PROBE AMP ([place]) NOT AT [facility]    EKG None  Radiology No results  found.  Procedures Procedures (including critical care time)  Medications Ordered in ED Medications  cefTRIAXone (ROCEPHIN) injection 1 g (has no administration in time range)    ED Course  I have reviewed the triage vital signs and the nursing notes.  Pertinent labs & imaging results that were available during my care of the patient were reviewed by me and considered in my medical decision making (see chart for details).    MDM Rules/Calculators/A&P                      Patient with symptoms of dysuria and hematuria.  Urinalysis is consistent with UTI.  He is well-appearing and nontoxic.  No CVA tenderness but endorses low-grade fever and chills.  His symptoms may be related to early developing pyelonephritis.  No history of kidney stones.  Patient denies new sexual partners, clinical suspicion for STI is low.  He will be treated here with Rocephin and prescribed Keflex.  Urine culture is pending.  He agrees to close follow-up with his PCP next week for recheck to ensure resolution.  Strict return precautions were also discussed.   Final Clinical Impression(s) / ED Diagnoses Final diagnoses:  Acute cystitis with hematuria    Rx / DC Orders ED Discharge Orders    None       Bufford Lope 10/07/19 2155    [name], MD 10/19/19 [id number]

## 2019-10-07 NOTE — ED Triage Notes (Signed)
Pt c/o burning with urination that started yesterday and states today he noticed some blood in his urine

## 2019-10-08 LAB — GC/CHLAMYDIA PROBE AMP (~~LOC~~) NOT AT ARMC

## 2019-10-10 LAB — URINE CULTURE: Culture: >=100000 — AB

## 2019-10-21 DIAGNOSIS — N390 Urinary tract infection, site not specified: Secondary | ICD-10-CM | POA: Diagnosis not present

## 2019-10-21 DIAGNOSIS — E1165 Type 2 diabetes mellitus with hyperglycemia: Secondary | ICD-10-CM | POA: Diagnosis not present

## 2019-11-12 DIAGNOSIS — I5032 Chronic diastolic (congestive) heart failure: Secondary | ICD-10-CM | POA: Diagnosis not present

## 2019-11-12 DIAGNOSIS — E1165 Type 2 diabetes mellitus with hyperglycemia: Secondary | ICD-10-CM | POA: Diagnosis not present

## 2019-11-12 DIAGNOSIS — I10 Essential (primary) hypertension: Secondary | ICD-10-CM | POA: Diagnosis not present

## 2019-11-12 DIAGNOSIS — E782 Mixed hyperlipidemia: Secondary | ICD-10-CM | POA: Diagnosis not present

## 2019-11-12 DIAGNOSIS — I110 Hypertensive heart disease with heart failure: Secondary | ICD-10-CM | POA: Diagnosis not present

## 2019-11-13 DIAGNOSIS — Z135 Encounter for screening for eye and ear disorders: Secondary | ICD-10-CM | POA: Diagnosis not present

## 2019-11-20 ENCOUNTER — Other Ambulatory Visit: Payer: Self-pay

## 2019-11-20 ENCOUNTER — Other Ambulatory Visit: Payer: Self-pay | Admitting: Urology

## 2019-11-20 DIAGNOSIS — N481 Balanitis: Secondary | ICD-10-CM

## 2019-11-20 MED ORDER — CLOTRIMAZOLE-BETAMETHASONE 1-0.05 % EX CREA: 1.0000 "application " | TOPICAL_CREAM | Freq: Two times a day (BID) | CUTANEOUS | 2 refills | Status: DC

## 2019-11-20 NOTE — Telephone Encounter (Signed)
Patient called and states he needs a refill on a few medications. He states he hasnt been seen since 2019 but he can schedule an appointment. He requested a nurse return his call.

## 2019-11-20 NOTE — Telephone Encounter (Signed)
Prescription refilled for pt and appt created and given to pt. Voiced understanding to keep scheduled appt

## 2019-12-04 DIAGNOSIS — E7849 Other hyperlipidemia: Secondary | ICD-10-CM | POA: Diagnosis not present

## 2019-12-04 DIAGNOSIS — I129 Hypertensive chronic kidney disease with stage 1 through stage 4 chronic kidney disease, or unspecified chronic kidney disease: Secondary | ICD-10-CM | POA: Diagnosis not present

## 2019-12-04 DIAGNOSIS — E1165 Type 2 diabetes mellitus with hyperglycemia: Secondary | ICD-10-CM | POA: Diagnosis not present

## 2019-12-04 DIAGNOSIS — I5032 Chronic diastolic (congestive) heart failure: Secondary | ICD-10-CM | POA: Diagnosis not present

## 2019-12-04 DIAGNOSIS — I10 Essential (primary) hypertension: Secondary | ICD-10-CM | POA: Diagnosis not present

## 2019-12-16 DIAGNOSIS — L03116 Cellulitis of left lower limb: Secondary | ICD-10-CM | POA: Diagnosis not present

## 2019-12-16 DIAGNOSIS — I110 Hypertensive heart disease with heart failure: Secondary | ICD-10-CM | POA: Diagnosis not present

## 2019-12-16 DIAGNOSIS — Z0000 Encounter for general adult medical examination without abnormal findings: Secondary | ICD-10-CM | POA: Diagnosis not present

## 2019-12-16 DIAGNOSIS — R110 Nausea: Secondary | ICD-10-CM | POA: Diagnosis not present

## 2019-12-16 DIAGNOSIS — K219 Gastro-esophageal reflux disease without esophagitis: Secondary | ICD-10-CM | POA: Diagnosis not present

## 2019-12-16 DIAGNOSIS — I5032 Chronic diastolic (congestive) heart failure: Secondary | ICD-10-CM | POA: Diagnosis not present

## 2019-12-16 DIAGNOSIS — L84 Corns and callosities: Secondary | ICD-10-CM | POA: Diagnosis not present

## 2019-12-16 DIAGNOSIS — I5030 Unspecified diastolic (congestive) heart failure: Secondary | ICD-10-CM | POA: Diagnosis not present

## 2019-12-16 DIAGNOSIS — E1165 Type 2 diabetes mellitus with hyperglycemia: Secondary | ICD-10-CM | POA: Diagnosis not present

## 2019-12-16 DIAGNOSIS — D508 Other iron deficiency anemias: Secondary | ICD-10-CM | POA: Diagnosis not present

## 2019-12-19 DIAGNOSIS — N182 Chronic kidney disease, stage 2 (mild): Secondary | ICD-10-CM | POA: Diagnosis not present

## 2019-12-19 DIAGNOSIS — E1165 Type 2 diabetes mellitus with hyperglycemia: Secondary | ICD-10-CM | POA: Diagnosis not present

## 2019-12-19 DIAGNOSIS — I2510 Atherosclerotic heart disease of native coronary artery without angina pectoris: Secondary | ICD-10-CM | POA: Diagnosis not present

## 2019-12-19 DIAGNOSIS — Z23 Encounter for immunization: Secondary | ICD-10-CM | POA: Diagnosis not present

## 2019-12-19 DIAGNOSIS — I10 Essential (primary) hypertension: Secondary | ICD-10-CM | POA: Diagnosis not present

## 2019-12-30 ENCOUNTER — Telehealth: Payer: Self-pay | Admitting: Cardiology

## 2019-12-30 NOTE — Telephone Encounter (Signed)

## 2020-01-10 ENCOUNTER — Telehealth: Payer: Self-pay | Admitting: Physician Assistant

## 2020-01-10 ENCOUNTER — Encounter: Payer: Self-pay | Admitting: Physician Assistant

## 2020-01-10 ENCOUNTER — Other Ambulatory Visit: Payer: Self-pay

## 2020-01-10 ENCOUNTER — Ambulatory Visit (INDEPENDENT_AMBULATORY_CARE_PROVIDER_SITE_OTHER): Payer: Medicare Other | Admitting: Physician Assistant

## 2020-01-10 VITALS — Ht 71.0 in | Wt 267.0 lb

## 2020-01-10 DIAGNOSIS — Z89511 Acquired absence of right leg below knee: Secondary | ICD-10-CM | POA: Diagnosis not present

## 2020-01-10 DIAGNOSIS — I739 Peripheral vascular disease, unspecified: Secondary | ICD-10-CM | POA: Diagnosis not present

## 2020-01-10 DIAGNOSIS — L97421 Non-pressure chronic ulcer of left heel and midfoot limited to breakdown of skin: Secondary | ICD-10-CM | POA: Diagnosis not present

## 2020-01-10 DIAGNOSIS — S88111A Complete traumatic amputation at level between knee and ankle, right lower leg, initial encounter: Secondary | ICD-10-CM

## 2020-01-10 NOTE — Telephone Encounter (Signed)
Patient called from [facility]. The reps asked for additional script for Diabetis shoe w/ transmet insert on left. Fax number is [phone]. [employer] number is [phone]. Please send ASAP.

## 2020-01-10 NOTE — Progress Notes (Signed)
Office Visit Note   Patient: Albert [name]           Date of Birth: 13-Nov-1960           MRN: [id number] Visit Date: 01/10/2020              Requested by: Celene Squibb, MD [place],  [place] [id number] PCP: Celene Squibb, MD  Chief Complaint  Patient presents with  . Right Leg - Follow-up      HPI: This is a pleasant 59 year old gentleman who is 7 years status post right below-knee amputation revision.  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  He has been wearing orthopedic shoes.  The shoes are now worn out and the Velcro is no longer functioning.  He also needs new prosthetic supplies as the silicone liners that he currently has he has had for several years and have worn through especially in the front that makes contact with the socket of the tibia  Assessment & Plan: Visit Diagnoses: No diagnosis found.  Plan: New prescription was provided for prosthetic supplies and diabetic shoes.  He may follow-up as needed  Follow-Up Instructions: No follow-ups on file.   Ortho Exam  Patient is alert, oriented, no adenopathy, well-dressed, normal affect, normal respiratory effort.  Focused exam his amputation stump is intact without any signs of wear.  His silicone sleeve does have a hole in it making it not fit properly and have proper contact with the socket he has sit [place] cannot collapse of his left foot.  The shoes he wears to compensate for this have now worn out and again he is not had new shoes in several years it is medically necessary for him to obtain new prosthetic supplies and new diabetic shoes to prevent any skin breakdown in his left foot and his right amputation stump  Imaging: No results found. No images are attached to the encounter.  Labs: Lab Results  Component Value Date   HGBA1C 7.5 (H) 12/29/2018   HGBA1C 7.7 (H) 11/10/2017   HGBA1C 9.1 (H) 02/08/2016   ESRSEDRATE 45 (H) 12/31/2018   CRP 10.0 (H)  12/31/2018   REPTSTATUS 10/10/2019 FINAL 10/07/2019   CULT >=100,000 COLONIES/mL PROTEUS MIRABILIS (A) 10/07/2019   LABORGA PROTEUS MIRABILIS (A) 10/07/2019     Lab Results  Component Value Date   ALBUMIN 3.1 (L) 05/17/2015   ALBUMIN 4.0 10/31/2014   ALBUMIN 4.1 08/28/2014    Lab Results  Component Value Date   MG 2.3 07/19/2013   MG 2.4 07/18/2013   MG 2.4 07/17/2013   No results found for: VD25OH  No results found for: PREALBUMIN CBC EXTENDED Latest Ref Rng & Units 12/31/2018 12/30/2018 12/29/2018  WBC 4.0 - 10.5 K/uL 9.6 9.9 12.3(H)  RBC 4.22 - 5.81 MIL/uL 4.50 4.39 4.68  HGB 13.0 - 17.0 g/dL 12.2(L) 12.0(L) 12.7(L)  HCT 39.0 - 52.0 % 38.9(L) 38.7(L) 40.8  PLT 150 - 400 K/uL 254 218 205  NEUTROABS 1.7 - 7.7 K/uL - - -  LYMPHSABS 0.7 - 4.0 K/uL - - -     Body mass index is 37.24 kg/m.  Orders:  No orders of the defined types were placed in this encounter.  No orders of the defined types were placed in this encounter.    Procedures: No procedures performed  Clinical Data: No additional findings.  ROS:  All other systems negative, except as noted in the  HPI. Review of Systems  Objective: Vital Signs: Ht 5\' 11"  (1.803 m)   Wt 267 lb (121.1 kg)   BMI 37.24 kg/m   Specialty Comments:  No specialty comments available.  PMFS History: Patient Active Problem List   Diagnosis Date Noted  . Cellulitis of left lower extremity   . Cellulitis in diabetic foot ([place]) 12/29/2018  . Sebaceous cyst   . Amputated below knee, right ([place]) 10/19/2017  . History of transmetatarsal amputation of left foot ([place]) 06/12/2017  . Special screening for malignant neoplasms, colon   . Midfoot ulcer, left, limited to breakdown of skin ([place]) 11/18/2016  . ARF (acute renal failure) ([place]) 02/08/2016  . Chronic combined systolic and diastolic CHF (congestive heart failure) ([place]) 02/08/2016  . Splenic infarct 01/06/2014  . Acute systolic CHF (congestive heart failure) ([place])  08/03/2013  . Hx of right BKA ([place]) 07/26/2013  . Anemia 07/15/2013  . Hyperkalemia 07/15/2013  . Hyposmolality and/or hyponatremia 07/15/2013  . Acute respiratory failure ([place]) 07/15/2013  . Mitral regurgitation 07/15/2013  . Oliguria 07/15/2013  . Bacteremia due to Staphylococcus aureus 07/15/2013  . Hyperchloremia 07/15/2013  . Acute kidney failure ([place]) 07/14/2013  . Hypotension, unspecified 07/14/2013  . Acute systolic heart failure ([place]) 07/12/2013  . Acute pulmonary edema ([place]) 07/11/2013  . Pneumonia, organism unspecified(486) 07/11/2013  . Hypoxemia 07/11/2013  . Cardiomyopathy, ischemic 07/03/2013  . Coronary atherosclerosis of native coronary artery 07/01/2013  . NSTEMI (non-ST elevated myocardial infarction) ([place]) 06/26/2013  . AKI (acute kidney injury) ([place]) 06/25/2013  . Shock circulatory ([place]) 06/25/2013  . Dehydration with hyponatremia 06/25/2013  . Leukocytosis 06/25/2013  . Sepsis(995.91) 06/25/2013  . Aftercare following surgery of the circulatory system, [place] 02/11/2013  . Peripheral vascular disease, unspecified ([place]) 12/31/2012  . Diabetes mellitus out of control ([place]) 12/17/2012  . Essential hypertension, benign 12/17/2012  . Atherosclerotic peripheral vascular disease ([place]) 12/17/2012  . Hypercholesteremia    Past Medical History:  Diagnosis Date  . Allergic rhinitis   . Anemia    takes supplement  . Chronic cough   . Chronic sinusitis   . Complication of anesthesia    couldn't swallow and talk  . Coronary artery disease   . CVA (cerebral infarction) 2011   R hand deficit  . Diabetes mellitus without complication ([place]) XX123456   last HbA1c ~9 .... fasting 160s  . GERD (gastroesophageal reflux disease)   . Heart murmur   . Hypercholesteremia   . Hypertension   . Myocardial infarction ([place]) 2014  . Osteomyelitis ([place])    first and third metatarsal  . Pneumonia    hx of walking  . Splenic infarct    setting of lupus anticoagulant  . Stroke  [facility]) 2011   right arm weakness    Family History  Problem Relation Age of Onset  . Pancreatic cancer Mother   . Diabetes Mother   . Hyperlipidemia Mother   . Diabetes Other   . Breast cancer Sister   . Depression Maternal Grandmother   . Heart disease Maternal Grandmother   . Depression Maternal Grandfather   . Heart disease Maternal Grandfather   . Depression Paternal Grandmother   . Heart disease Paternal Grandmother   . Depression Paternal Grandfather   . Heart disease Paternal Grandfather     Past Surgical History:  Procedure Laterality Date  . 4th Toe Amputation Left Jan. 2014   4th toe in [place]  . AMPUTATION Left 12/08/2012   Procedure: AMPUTATION DIGIT;  Surgeon: Lind Guest  Ninfa Linden, MD;  Location: [facility];  Service: Orthopedics;  Laterality: Left;  3rd toe amputation possible 5th toe amputation  . AMPUTATION Left 12/11/2012   Procedure: Amputation of 2nd Toe;  Surgeon: Mcarthur Rossetti, MD;  Location: [facility];  Service: Orthopedics;  Laterality: Left;  . AMPUTATION Right 06/29/2013   Procedure: AMPUTATION BELOW KNEE;  Surgeon: Newt Minion, MD;  Location: [facility];  Service: Orthopedics;  Laterality: Right;  . AMPUTATION Left 08/29/2014   Procedure: Left Great Toe Amputation at MTP;  Surgeon: Newt Minion, MD;  Location: [place];  Service: Orthopedics;  Laterality: Left;  . AMPUTATION Left 10/31/2014   Procedure: Midfoot Amputation;  Surgeon: Newt Minion, MD;  Location: [place];  Service: Orthopedics;  Laterality: Left;  . BYPASS GRAFT POPLITEAL TO TIBIAL Left 12/13/2012   Procedure: BYPASS GRAFT POPLITEAL TO TIBIAL;  Surgeon: Serafina Mitchell, MD;  [facility];  Service: Vascular;  Laterality: Left;  Left Popliteal to Posterior Tibial Bypass Graft with reversed saphenous vein graft  . CARDIAC CATHETERIZATION    . COLONOSCOPY [name] 04/14/2017   Procedure: COLONOSCOPY;  Surgeon: Danie Binder, MD;  [facility];  Service: Endoscopy;  Laterality: [name];  1000  . I  & D EXTREMITY Left 12/08/2012   Procedure: IRRIGATION AND DEBRIDEMENT EXTREMITY;  Surgeon: Mcarthur Rossetti, MD;  Location: [place];  Service: Orthopedics;  Laterality: Left;  . I & D EXTREMITY Left 12/11/2012   Procedure: REPEAT I&D LEFT FOOT;  Surgeon: Mcarthur Rossetti, MD;  Location: [place];  Service: Orthopedics;  Laterality: Left;  . INCISION AND DRAINAGE Right 06/25/2013   Procedure: INCISION AND DRAINAGE RIGHT FOOT;  Surgeon: Mcarthur Rossetti, MD;  [facility];  Service: Orthopedics;  Laterality: Right;  . LEFT HEART CATHETERIZATION WITH CORONARY ANGIOGRAM [name] 06/28/2013   Procedure: LEFT HEART CATHETERIZATION WITH CORONARY ANGIOGRAM;  Surgeon: Burnell Blanks, MD;  Location: [facility] CATH LAB;  Service: Cardiovascular;  Laterality: [name];  . MASS EXCISION [name] 11/15/2017   Procedure: EXCISION 4CM CYST ON NECK;  Surgeon: Virl Cagey, MD;  [facility];  Service: General;  Laterality: [name];  . PENILE PROSTHESIS IMPLANT    . POLYPECTOMY  04/14/2017   Procedure: POLYPECTOMY;  Surgeon: Danie Binder, MD;  [facility];  Service: Endoscopy;;  colon  . STUMP REVISION Right 08/16/2013   Procedure: STUMP REVISION- right Below Knee Amputation;  Surgeon: Newt Minion, MD;  Location: [place];  Service: Orthopedics;  Laterality: Right;  . TEE WITHOUT CARDIOVERSION [name] 07/03/2013   Procedure: TRANSESOPHAGEAL ECHOCARDIOGRAM (TEE);  Surgeon: Larey Dresser, MD;  Location: [place];  Service: Cardiovascular;  Laterality: [name];  . TONSILLECTOMY AND ADENOIDECTOMY     Social History   Occupational History  . 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 Smoking status: Never Smoker  . Smokeless tobacco: Never Used  Substance and Sexual Activity  . Alcohol use: No    Alcohol/week: 0.0 standard drinks  . Drug use: No  . Sexual activity: Never

## 2020-01-13 NOTE — Telephone Encounter (Signed)
Can you please fax thanks

## 2020-01-13 NOTE — Telephone Encounter (Signed)
Can you advise? 

## 2020-01-13 NOTE — Telephone Encounter (Signed)
faxed

## 2020-01-14 ENCOUNTER — Telehealth (INDEPENDENT_AMBULATORY_CARE_PROVIDER_SITE_OTHER): Payer: Medicare Other | Admitting: Cardiology

## 2020-01-14 ENCOUNTER — Encounter: Payer: Self-pay | Admitting: *Deleted

## 2020-01-14 ENCOUNTER — Encounter: Payer: Self-pay | Admitting: Cardiology

## 2020-01-14 VITALS — Ht 71.0 in | Wt 259.0 lb

## 2020-01-14 DIAGNOSIS — I5022 Chronic systolic (congestive) heart failure: Secondary | ICD-10-CM

## 2020-01-14 DIAGNOSIS — I10 Essential (primary) hypertension: Secondary | ICD-10-CM

## 2020-01-14 DIAGNOSIS — I2510 Atherosclerotic heart disease of native coronary artery without angina pectoris: Secondary | ICD-10-CM

## 2020-01-14 DIAGNOSIS — E782 Mixed hyperlipidemia: Secondary | ICD-10-CM

## 2020-01-14 NOTE — Progress Notes (Signed)
Virtual Visit via Telephone Note   This visit type was conducted due to national recommendations for restrictions regarding the COVID-19 Pandemic (e.g. social distancing) in an effort to limit this patient's exposure and mitigate transmission in our community.  Due to his co-morbid illnesses, this patient is at least at moderate risk for complications without adequate follow up.  This format is felt to be most appropriate for this patient at this time.  The patient did not have access to video technology/had technical difficulties with video requiring transitioning to audio format only (telephone).  All issues noted in this document were discussed and addressed.  No physical exam could be performed with this format.  Please refer to the patient's chart for his  consent to telehealth for [facility].   The patient was identified using 2 identifiers.  Date:  01/14/2020   ID:  Albert [name], DOB [birth year], MRN [id number]  Patient Location: Home Provider Location: Office  PCP:  Celene Squibb, MD  Cardiologist:  Carlyle Dolly, MD  Electrophysiologist:  None   Evaluation Performed:  Follow-Up Visit  Chief Complaint:  Follow up  History of Present Illness:    Albert [name] is a 59 y.o. male  seen today for follow up of the following medical problems.   1. Chronic combined systolic/diastolic heart failure - ICM, LVEF 40-45% by echo 12/2013, grade II diastolic dysfunction - [id number] LVEF [id number], grade I diastolic dysfunction    -no recent SOB/DOE. Occasional edema controlled with lasix. Weights down from 268 to 259 lbs by home scale. Normal range 259-261 lbs.  - compliant with meds  2. CAD - hx of demand ischemia 06/2013 in setting of sepsis with necrotic foot wound. - cath at that time 06/2013 LM patent, LAD 40% prox, 70% mid follow by 60% lesion, distal 90%. Diag 99%. LCX 80-90% prox, OM1 80%, OM2 99%, OM3 99 % (all OMs are small), RCA small non-dom mid 99%. LVEF 40% by LVgram.   - CAD was managed medically after discussions with CT surgery at that time, with plans to reevaluate once recurring infection issues resolved.   - no recent chest pain.   4. PAD - followed by vascular and [facility] - previous right BKA, previous multiple toe amputations. 08/30/14 treated for osteo and ulcer of left great toe with amputation. Jan 2016 had left midfoot amputation.  - Followed by ortho Dr Sharol Given.     5. Hyperlipidemia - labs followed by pcp - compliant with statin    6. HTN -compliant with meds  7. Carotid stenosis - 03/2015 mild bilateraldisease    [id number] married, honeymoon in [place] and [place] covid vaccine 2 weeks ago   The patient does not have symptoms concerning for COVID-19 infection (fever, chills, cough, or new shortness of breath).    Past Medical History:  Diagnosis Date  . Allergic rhinitis   . Anemia    takes supplement  . Chronic cough   . Chronic sinusitis   . Complication of anesthesia    couldn't swallow and talk  . Coronary artery disease   . CVA (cerebral infarction) 2011   R hand deficit  . Diabetes mellitus without complication ([place]) XX123456   last HbA1c ~9 .... fasting 160s  . GERD (gastroesophageal reflux disease)   . Heart murmur   . Hypercholesteremia   . Hypertension   . Myocardial infarction ([place]) 2014  . Osteomyelitis ([place])    first and third metatarsal  . Pneumonia  hx of walking  . Splenic infarct    setting of lupus anticoagulant  . Stroke [facility]) 2011   right arm weakness   Past Surgical History:  Procedure Laterality Date  . 4th Toe Amputation Left Jan. 2014   4th toe in [place]  . AMPUTATION Left 12/08/2012   Procedure: AMPUTATION DIGIT;  Surgeon: Mcarthur Rossetti, MD;  Location: [facility];  Service: Orthopedics;  Laterality: Left;  3rd toe amputation possible 5th toe amputation  . AMPUTATION Left 12/11/2012   Procedure: Amputation of 2nd Toe;  Surgeon: Mcarthur Rossetti, MD;  Location: [place];  Service: Orthopedics;  Laterality: Left;  . AMPUTATION Right 06/29/2013   Procedure: AMPUTATION BELOW KNEE;  Surgeon: Newt Minion, MD;  Location: [place];  Service: Orthopedics;  Laterality: Right;  . AMPUTATION Left 08/29/2014   Procedure: Left Great Toe Amputation at MTP;  Surgeon: Newt Minion, MD;  Location: [place];  Service: Orthopedics;  Laterality: Left;  . AMPUTATION Left 10/31/2014   Procedure: Midfoot Amputation;  Surgeon: Newt Minion, MD;  Location: [place];  Service: Orthopedics;  Laterality: Left;  . BYPASS GRAFT POPLITEAL TO TIBIAL Left 12/13/2012   Procedure: BYPASS GRAFT POPLITEAL TO TIBIAL;  Surgeon: Serafina Mitchell, MD;  [facility];  Service: Vascular;  Laterality: Left;  Left Popliteal to Posterior Tibial Bypass Graft with reversed saphenous vein graft  . CARDIAC CATHETERIZATION    . COLONOSCOPY [name] 04/14/2017   Procedure: COLONOSCOPY;  Surgeon: Danie Binder, MD;  [facility];  Service: Endoscopy;  Laterality: [name];  1000  . I & D EXTREMITY Left 12/08/2012   Procedure: IRRIGATION AND DEBRIDEMENT EXTREMITY;  Surgeon: Mcarthur Rossetti, MD;  Location: [place];  Service: Orthopedics;  Laterality: Left;  . I & D EXTREMITY Left 12/11/2012   Procedure: REPEAT I&D LEFT FOOT;  Surgeon: Mcarthur Rossetti, MD;  Location: [place];  Service: Orthopedics;  Laterality: Left;  . INCISION AND DRAINAGE Right 06/25/2013   Procedure: INCISION AND DRAINAGE RIGHT FOOT;  Surgeon: Mcarthur Rossetti, MD;  [facility];  Service: Orthopedics;  Laterality: Right;  . LEFT HEART CATHETERIZATION WITH CORONARY ANGIOGRAM [name] 06/28/2013   Procedure: LEFT HEART CATHETERIZATION WITH CORONARY ANGIOGRAM;  Surgeon: Burnell Blanks, MD;  Location: [facility] CATH LAB;  Service: Cardiovascular;  Laterality: [name];  . MASS EXCISION [name] 11/15/2017   Procedure: EXCISION 4CM CYST ON NECK;  Surgeon: Virl Cagey, MD;  [facility];  Service: General;   Laterality: [name];  . PENILE PROSTHESIS IMPLANT    . POLYPECTOMY  04/14/2017   Procedure: POLYPECTOMY;  Surgeon: Danie Binder, MD;  [facility];  Service: Endoscopy;;  colon  . STUMP REVISION Right 08/16/2013   Procedure: STUMP REVISION- right Below Knee Amputation;  Surgeon: Newt Minion, MD;  Location: [place];  Service: Orthopedics;  Laterality: Right;  . TEE WITHOUT CARDIOVERSION [name] 07/03/2013   Procedure: TRANSESOPHAGEAL ECHOCARDIOGRAM (TEE);  Surgeon: Larey Dresser, MD;  Location: [place];  Service: Cardiovascular;  Laterality: [name];  . TONSILLECTOMY AND ADENOIDECTOMY       No outpatient medications have been marked as taking for the 01/14/20 encounter (Appointment) with Arnoldo Lenis, MD.     Allergies:   Amoxicillin   Social History   Tobacco Use  . Smoking status: Never Smoker  . Smokeless tobacco: Never Used  Substance Use Topics  . Alcohol use: No    Alcohol/week: 0.0 standard drinks  .  Drug use: No     Family Hx: The patient's family history includes Breast cancer in his sister; Depression in his maternal grandfather, maternal grandmother, paternal grandfather, and paternal grandmother; Diabetes in his mother and another family member; Heart disease in his maternal grandfather, maternal grandmother, paternal grandfather, and paternal grandmother; Hyperlipidemia in his mother; Pancreatic cancer in his mother.  ROS:   Please see the history of present illness.     All other systems reviewed and are negative.   Prior CV studies:   The following studies were reviewed today:  06/2013 Cath Hemodynamic Findings: Central aortic pressure: 90/61 Left ventricular pressure: 90/19/29  Angiographic Findings:  Left main: Short segment without obstruction.   Left Anterior Descending Artery: Moderate caliber diffusely diseased vessel that courses to the apex. The proximal vessel has diffuse 40% stenosis. The mid vessel has a 70% stenosis followed by  a 60% stenosis. The distal vessel has diffuse 90% stenosis. The diagonal is a moderate caliber vessel with ostial 99% stenosis with 99% disease extending into the mid vessel.   Circumflex Artery: Large caliber vessel with proximal 80-90% stenosis. There are three small to moderate caliber obtuse marginal branches. The first OM has mid 80% stenosis. The second OM has a proximal 99% stenosis. The third OM has diffuse 99% stenosis. The left sided posterolateral Takira Sherrin has 99% stenosis.   Right Coronary Artery: Small non-dominant vessel with mid 99% stenosis.   Left Ventricular Angiogram: LVEF=40%  Impression: 1. Triple vessel CAD 2. NSTEMI secondary to demand ischemia from sepsis with severe underlying CAD 3. Mild LV systolic dysfunction  Recommendations: Complex situation. The only option for coronary revascularization is CABG. He is not a favorable candidate for CABG at this time with necrotic right foot, sepsis. Even though not ideal, would favor medical management of CAD while undergoing treatment of his necrotic foot. Would proceed with right BKA this weekend. I have discussed the case with CT surgery who agrees that CABG is not a good option at this time. Will ask CT surgery to see after he recovers from his BKA. Continue ASA, beta blocker, statin.    Complications:None. The patient tolerated the procedure well.     11/2012 Carotid US Summary:  - No significant extracranial carotid artery stenosis demonstrated. Vertebrals are patent with antegrade flow. - ICA/CCA ratio= right = 0.94. left = 0.73. Other specific details can be found in the table(s) above.  Prepared and Electronically Authenticated by  03/2015 Carotid US IMPRESSION: Mild atherosclerotic disease in the carotid arteries, right side greater the left. The peak systolic velocity in the proximal right internal carotid artery is slightly elevated. Estimated degree of stenosis in the right  internal carotid artery is close to 50%.  Estimated degree of stenosis in the left internal carotid artery is less than 50%.  Patent vertebral arteries.   12/07/15 Clinic EKG (performed and reviewed in clinic): NSR  01/2017 echo Study Conclusions  - Left ventricle: The cavity size was normal. Wall thickness was at the upper limits of normal. The estimated ejection fraction was 50%. Doppler parameters are consistent with abnormal left ventricular relaxation (grade 1 diastolic dysfunction). - Aortic valve: Mildly calcified annulus. Trileaflet. - Mitral valve: Calcified annulus. Mildly thickened leaflets . There was trivial regurgitation. - Tricuspid valve: There was trivial regurgitation. Peak RV-RA gradient (S): 21 mm Hg. - Pulmonary arteries: Systolic pressure could not be accurately estimated. - Pericardium, extracardiac: A trivial pericardial effusion was identified.  Impressions:  - Overall limited images. Upper normal  LV wall thickness with LVEF approximately 50% based on best views. Cannot exclude periapical wall motion abnormalities. Would suggest a limited study with Definity contrast. Calcified mitral annulus with mildly thickened leaflets and trivial mitral regurgitation. Trivial tricuspid regurgitation with RV-RA gradient 21 mmHg. Trivial pericardial effusion.  Labs/Other Tests and Data Reviewed:    EKG:  No ECG reviewed.  Recent Labs: No results found for requested labs within last 8760 hours.   Recent Lipid Panel Lab Results  Component Value Date/Time   CHOL 91 12/08/2012 05:55 AM   TRIG 73 12/08/2012 05:55 AM   HDL 22 (L) 12/08/2012 05:55 AM   CHOLHDL 4.1 12/08/2012 05:55 AM   LDLCALC 54 12/08/2012 05:55 AM    Wt Readings from Last 3 Encounters:  01/10/20 267 lb (121.1 kg)  10/07/19 267 lb (121.1 kg)  04/08/19 268 lb (121.6 kg)     Objective:    Vital Signs:   Today's Vitals   01/14/20 0828  Weight: 259 lb  (117.5 kg)  Height: 5\' 11"  (1.803 m)   Body mass index is 36.12 kg/m. Normal affect. Normal speech pattern and tone. Comfortable, no apparent distress. No audible signs of SOB or wheezing.   ASSESSMENT & PLAN:    1. Chronic systolic heart failure -LVEFhas normalized based on most recent echo -denies any symptoms, continue current meds  2. CAD -no symptoms, continue current meds   3. Hyperlipidemia - request pcp labs, continue staitn   4. HTN - continue current meds  COVID-19 Education: The signs and symptoms of COVID-19 were discussed with the patient and how to seek care for testing (follow up with PCP or arrange E-visit).  The importance of social distancing was discussed today.  Time:   Today, I have spent 23 minutes with the patient with telehealth technology discussing the above problems.     Medication Adjustments/Labs and Tests Ordered: Current medicines are reviewed at length with the patient today.  Concerns regarding medicines are outlined above.   Tests Ordered: No orders of the defined types were placed in this encounter.   Medication Changes: No orders of the defined types were placed in this encounter.   Follow Up:  Either In Person or Virtual in 6 month(s)  Signed, Carlyle Dolly, MD  01/14/2020 8:20 AM    [place]

## 2020-01-14 NOTE — Patient Instructions (Signed)

## 2020-01-23 ENCOUNTER — Telehealth: Payer: Self-pay | Admitting: Orthopedic Surgery

## 2020-01-23 NOTE — Telephone Encounter (Signed)
Unable to locate original. Need a new one please

## 2020-01-23 NOTE — Telephone Encounter (Signed)
Do we still have this rx for [employer] that we can refax?

## 2020-01-23 NOTE — Telephone Encounter (Signed)
Patient called stating [employer] never got our fax and he would like for [place] to try it again. The reps asked for additional script for Diabetis shoe w/ transmet insert on left. Fax number is [phone]. [employer] number is [phone]. Please send ASAP.

## 2020-01-23 NOTE — Telephone Encounter (Signed)
Can you please write another rx [employer] did not receive rx and unable to locate here in office.

## 2020-01-28 ENCOUNTER — Ambulatory Visit: Payer: Medicare Other | Admitting: Urology

## 2020-02-18 DIAGNOSIS — I130 Hypertensive heart and chronic kidney disease with heart failure and stage 1 through stage 4 chronic kidney disease, or unspecified chronic kidney disease: Secondary | ICD-10-CM | POA: Diagnosis not present

## 2020-02-18 DIAGNOSIS — E1122 Type 2 diabetes mellitus with diabetic chronic kidney disease: Secondary | ICD-10-CM | POA: Diagnosis not present

## 2020-02-18 DIAGNOSIS — I5032 Chronic diastolic (congestive) heart failure: Secondary | ICD-10-CM | POA: Diagnosis not present

## 2020-02-18 DIAGNOSIS — Z23 Encounter for immunization: Secondary | ICD-10-CM | POA: Diagnosis not present

## 2020-02-18 DIAGNOSIS — N182 Chronic kidney disease, stage 2 (mild): Secondary | ICD-10-CM | POA: Diagnosis not present

## 2020-02-19 DIAGNOSIS — Z89511 Acquired absence of right leg below knee: Secondary | ICD-10-CM | POA: Diagnosis not present

## 2020-03-25 DIAGNOSIS — E1165 Type 2 diabetes mellitus with hyperglycemia: Secondary | ICD-10-CM | POA: Diagnosis not present

## 2020-03-25 DIAGNOSIS — D649 Anemia, unspecified: Secondary | ICD-10-CM | POA: Diagnosis not present

## 2020-03-25 DIAGNOSIS — D508 Other iron deficiency anemias: Secondary | ICD-10-CM | POA: Diagnosis not present

## 2020-03-25 DIAGNOSIS — D509 Iron deficiency anemia, unspecified: Secondary | ICD-10-CM | POA: Diagnosis not present

## 2020-03-25 DIAGNOSIS — E1122 Type 2 diabetes mellitus with diabetic chronic kidney disease: Secondary | ICD-10-CM | POA: Diagnosis not present

## 2020-04-01 DIAGNOSIS — N182 Chronic kidney disease, stage 2 (mild): Secondary | ICD-10-CM | POA: Diagnosis not present

## 2020-04-01 DIAGNOSIS — Z0001 Encounter for general adult medical examination with abnormal findings: Secondary | ICD-10-CM | POA: Diagnosis not present

## 2020-04-01 DIAGNOSIS — E1165 Type 2 diabetes mellitus with hyperglycemia: Secondary | ICD-10-CM | POA: Diagnosis not present

## 2020-04-01 DIAGNOSIS — I2510 Atherosclerotic heart disease of native coronary artery without angina pectoris: Secondary | ICD-10-CM | POA: Diagnosis not present

## 2020-04-01 DIAGNOSIS — I10 Essential (primary) hypertension: Secondary | ICD-10-CM | POA: Diagnosis not present

## 2020-04-06 DIAGNOSIS — I129 Hypertensive chronic kidney disease with stage 1 through stage 4 chronic kidney disease, or unspecified chronic kidney disease: Secondary | ICD-10-CM | POA: Diagnosis not present

## 2020-04-06 DIAGNOSIS — Z0001 Encounter for general adult medical examination with abnormal findings: Secondary | ICD-10-CM | POA: Diagnosis not present

## 2020-04-06 DIAGNOSIS — E1122 Type 2 diabetes mellitus with diabetic chronic kidney disease: Secondary | ICD-10-CM | POA: Diagnosis not present

## 2020-04-06 DIAGNOSIS — N182 Chronic kidney disease, stage 2 (mild): Secondary | ICD-10-CM | POA: Diagnosis not present

## 2020-04-06 DIAGNOSIS — E785 Hyperlipidemia, unspecified: Secondary | ICD-10-CM | POA: Diagnosis not present

## 2020-05-15 DIAGNOSIS — E785 Hyperlipidemia, unspecified: Secondary | ICD-10-CM | POA: Diagnosis not present

## 2020-05-15 DIAGNOSIS — I129 Hypertensive chronic kidney disease with stage 1 through stage 4 chronic kidney disease, or unspecified chronic kidney disease: Secondary | ICD-10-CM | POA: Diagnosis not present

## 2020-05-15 DIAGNOSIS — Z0001 Encounter for general adult medical examination with abnormal findings: Secondary | ICD-10-CM | POA: Diagnosis not present

## 2020-05-15 DIAGNOSIS — E1122 Type 2 diabetes mellitus with diabetic chronic kidney disease: Secondary | ICD-10-CM | POA: Diagnosis not present

## 2020-05-15 DIAGNOSIS — N182 Chronic kidney disease, stage 2 (mild): Secondary | ICD-10-CM | POA: Diagnosis not present

## 2020-05-27 DIAGNOSIS — E785 Hyperlipidemia, unspecified: Secondary | ICD-10-CM | POA: Diagnosis not present

## 2020-05-27 DIAGNOSIS — N182 Chronic kidney disease, stage 2 (mild): Secondary | ICD-10-CM | POA: Diagnosis not present

## 2020-05-27 DIAGNOSIS — E1122 Type 2 diabetes mellitus with diabetic chronic kidney disease: Secondary | ICD-10-CM | POA: Diagnosis not present

## 2020-05-27 DIAGNOSIS — I129 Hypertensive chronic kidney disease with stage 1 through stage 4 chronic kidney disease, or unspecified chronic kidney disease: Secondary | ICD-10-CM | POA: Diagnosis not present

## 2020-05-27 DIAGNOSIS — Z0001 Encounter for general adult medical examination with abnormal findings: Secondary | ICD-10-CM | POA: Diagnosis not present

## 2020-06-30 DIAGNOSIS — N182 Chronic kidney disease, stage 2 (mild): Secondary | ICD-10-CM | POA: Diagnosis not present

## 2020-06-30 DIAGNOSIS — E1122 Type 2 diabetes mellitus with diabetic chronic kidney disease: Secondary | ICD-10-CM | POA: Diagnosis not present

## 2020-06-30 DIAGNOSIS — E785 Hyperlipidemia, unspecified: Secondary | ICD-10-CM | POA: Diagnosis not present

## 2020-06-30 DIAGNOSIS — Z0001 Encounter for general adult medical examination with abnormal findings: Secondary | ICD-10-CM | POA: Diagnosis not present

## 2020-06-30 DIAGNOSIS — I129 Hypertensive chronic kidney disease with stage 1 through stage 4 chronic kidney disease, or unspecified chronic kidney disease: Secondary | ICD-10-CM | POA: Diagnosis not present

## 2020-07-10 DIAGNOSIS — E1122 Type 2 diabetes mellitus with diabetic chronic kidney disease: Secondary | ICD-10-CM | POA: Diagnosis not present

## 2020-07-10 DIAGNOSIS — L84 Corns and callosities: Secondary | ICD-10-CM | POA: Diagnosis not present

## 2020-07-10 DIAGNOSIS — E1165 Type 2 diabetes mellitus with hyperglycemia: Secondary | ICD-10-CM | POA: Diagnosis not present

## 2020-07-10 DIAGNOSIS — R110 Nausea: Secondary | ICD-10-CM | POA: Diagnosis not present

## 2020-07-10 DIAGNOSIS — I5032 Chronic diastolic (congestive) heart failure: Secondary | ICD-10-CM | POA: Diagnosis not present

## 2020-07-10 DIAGNOSIS — I110 Hypertensive heart disease with heart failure: Secondary | ICD-10-CM | POA: Diagnosis not present

## 2020-07-10 DIAGNOSIS — L03116 Cellulitis of left lower limb: Secondary | ICD-10-CM | POA: Diagnosis not present

## 2020-07-16 DIAGNOSIS — I10 Essential (primary) hypertension: Secondary | ICD-10-CM | POA: Diagnosis not present

## 2020-07-16 DIAGNOSIS — N182 Chronic kidney disease, stage 2 (mild): Secondary | ICD-10-CM | POA: Diagnosis not present

## 2020-07-16 DIAGNOSIS — E1165 Type 2 diabetes mellitus with hyperglycemia: Secondary | ICD-10-CM | POA: Diagnosis not present

## 2020-07-16 DIAGNOSIS — I2510 Atherosclerotic heart disease of native coronary artery without angina pectoris: Secondary | ICD-10-CM | POA: Diagnosis not present

## 2020-07-16 DIAGNOSIS — Z23 Encounter for immunization: Secondary | ICD-10-CM | POA: Diagnosis not present

## 2020-07-28 ENCOUNTER — Encounter: Payer: Self-pay | Admitting: Cardiology

## 2020-07-28 ENCOUNTER — Other Ambulatory Visit: Payer: Self-pay | Admitting: Cardiology

## 2020-07-28 ENCOUNTER — Ambulatory Visit (INDEPENDENT_AMBULATORY_CARE_PROVIDER_SITE_OTHER): Payer: Medicare Other | Admitting: Cardiology

## 2020-07-28 VITALS — BP 110/78 | HR 66 | Ht 71.0 in | Wt 266.8 lb

## 2020-07-28 DIAGNOSIS — I10 Essential (primary) hypertension: Secondary | ICD-10-CM

## 2020-07-28 DIAGNOSIS — E782 Mixed hyperlipidemia: Secondary | ICD-10-CM

## 2020-07-28 DIAGNOSIS — I6523 Occlusion and stenosis of bilateral carotid arteries: Secondary | ICD-10-CM

## 2020-07-28 DIAGNOSIS — I5022 Chronic systolic (congestive) heart failure: Secondary | ICD-10-CM

## 2020-07-28 DIAGNOSIS — I2510 Atherosclerotic heart disease of native coronary artery without angina pectoris: Secondary | ICD-10-CM | POA: Diagnosis not present

## 2020-07-28 NOTE — Patient Instructions (Signed)
Your physician wants you to follow-up in: [place] will receive a reminder letter in the mail two months in advance. If you don't receive a letter, please call our office to schedule the follow-up appointment.  Your physician recommends that you continue on your current medications as directed. Please refer to the Current Medication list given to you today.  Your physician has requested that you have a carotid duplex. This test is an ultrasound of the carotid arteries in your neck. It looks at blood flow through these arteries that supply the brain with blood. Allow one hour for this exam. There are no restrictions or special instructions.  Thank you for choosing [facility]!!

## 2020-07-28 NOTE — Progress Notes (Signed)
Clinical Summary Albert [name] is a 59 y.o.male  seen today for follow up of the following medical problems.   1. Chronic combined systolic/diastolic heart failure - ICM, LVEF 40-45% by echo 12/2013, grade II diastolic dysfunction - 01/346 LVEF 42%, grade I diastolic dysfunction     - no SOB or DOE. Occasional LE edema, takes extra lasix and resolved.  - home weights usually around 259-263 lbs which is his baseline.  - compliant with meds  2. CAD - hx of demand ischemia 06/2013 in setting of sepsis with necrotic foot wound. - cath at that time 06/2013 LM patent, LAD 40% prox, 70% mid follow by 60% lesion, distal 90%. Diag 99%. LCX 80-90% prox, OM1 80%, OM2 99%, OM3 99 % (all OMs are small), RCA small non-dom mid 99%. LVEF 40% by LVgram.  - CAD was managed medically after discussions with CT surgery at that time, with plans to reevaluate once recurring infection issues resolved.   - denies any chest pains.   4. PAD - followed by vascular and [facility] - previous right BKA, previous multiple toe amputations. 08/30/14 treated for osteo and ulcer of left great toe with amputation. Jan 2016 had left midfoot amputation.  - Followed by ortho Dr Sharol Given.     5. Hyperlipidemia - labs followed by pcp - compliant with statin  - 06/2020 TC 106 TG 106 HDL 29 LDL 57   6. HTN -he is compliant with meds  7. Carotid stenosis - 03/2015 mild bilateraldisease    SH: Had johnson and johnson covid vaccine Albert [name] with recent severe case of viral meningitis, still recovering Brother in law recently passed from stroke.     Past Medical History:  Diagnosis Date  . Allergic rhinitis   . Anemia    takes supplement  . Chronic cough   . Chronic sinusitis   . Complication of anesthesia    couldn't swallow and talk  . Coronary artery disease   . CVA (cerebral infarction) 2011   R hand deficit  . Diabetes mellitus without complication ([place]) ~5956   last HbA1c ~9  .... fasting 160s  . GERD (gastroesophageal reflux disease)   . Heart murmur   . Hypercholesteremia   . Hypertension   . Myocardial infarction ([place]) 2014  . Osteomyelitis ([place])    first and third metatarsal  . Pneumonia    hx of walking  . Splenic infarct    setting of lupus anticoagulant  . Stroke [facility]) 2011   right arm weakness     Allergies  Allergen Reactions  . Amoxicillin Nausea And Vomiting    Has patient had a PCN reaction causing immediate rash, facial/tongue/throat swelling, SOB or lightheadedness with hypotension: No Has patient had a PCN reaction causing severe rash involving mucus membranes or skin necrosis: No Has patient had a PCN reaction that required hospitalization: No Has patient had a PCN reaction occurring within the last 10 years: Yes If all of the above answers are "NO", then may proceed with Cephalosporin use.      Current Outpatient Medications  Medication Sig Dispense Refill  . aspirin EC 81 MG tablet Take 1 tablet (81 mg total) by mouth daily. 30 tablet 1  . atorvastatin (LIPITOR) 80 MG tablet Take 80 mg by mouth every other day.     . carvedilol (COREG) 25 MG tablet Take 25 mg by mouth 2 (two) times daily.     . clotrimazole-betamethasone (LOTRISONE) cream Apply 1 application topically 2 (  two) times daily. 30 g 2  . furosemide (LASIX) 80 MG tablet Take 80 mg by mouth daily. May take additional 40 mg as needed for weight gain    . hydrALAZINE (APRESOLINE) 10 MG tablet Take 1 tablet (10 mg total) by mouth 3 (three) times daily. 120 tablet 1  . Insulin Lispro (HUMALOG KWIKPEN [place]) Inject 25 Units into the skin in the morning, at noon, and at bedtime.    Marland Kitchen losartan (COZAAR) 50 MG tablet TAKE 1 TABLET BY MOUTH  DAILY 90 tablet 0  . metFORMIN (GLUCOPHAGE-XR) 500 MG 24 hr tablet Take 1 tablet by mouth in the morning and at bedtime.     . mupirocin ointment (BACTROBAN) 2 % Apply 1 application topically daily. (Patient taking differently: Apply 1 application  topically once a week. Apply to left foot) 22 g 6  . nitroGLYCERIN (NITRODUR - DOSED IN MG/24 HR) 0.2 mg/hr patch APPLY 1 PATCH ONTO SKIN  DAILY 90 patch 3  . pantoprazole (PROTONIX) 40 MG tablet Take 1 tablet (40 mg total) by mouth daily. 30 tablet 1  . pentoxifylline (TRENTAL) 400 MG CR tablet Take 1 tablet (400 mg total) by mouth 3 (three) times daily with meals. 90 tablet 11  . potassium chloride (K-DUR,KLOR-CON) 10 MEQ tablet Take 1 tablet (10 mEq total) by mouth daily. 30 tablet 6  . TRESIBA FLEXTOUCH 100 UNIT/ML SOPN FlexTouch Pen Inject 57 Units into the skin daily.  6  . TRUVADA 200-300 MG tablet Take 1 tablet by mouth daily.  5   No current facility-administered medications for this visit.     Past Surgical History:  Procedure Laterality Date  . 4th Toe Amputation Left Jan. 2014   4th toe in [place]  . AMPUTATION Left 12/08/2012   Procedure: AMPUTATION DIGIT;  Surgeon: Mcarthur Rossetti, MD;  Location: [place];  Service: Orthopedics;  Laterality: Left;  3rd toe amputation possible 5th toe amputation  . AMPUTATION Left 12/11/2012   Procedure: Amputation of 2nd Toe;  Surgeon: Mcarthur Rossetti, MD;  Location: [place];  Service: Orthopedics;  Laterality: Left;  . AMPUTATION Right 06/29/2013   Procedure: AMPUTATION BELOW KNEE;  Surgeon: Newt Minion, MD;  Location: [place];  Service: Orthopedics;  Laterality: Right;  . AMPUTATION Left 08/29/2014   Procedure: Left Great Toe Amputation at MTP;  Surgeon: Newt Minion, MD;  Location: [place];  Service: Orthopedics;  Laterality: Left;  . AMPUTATION Left 10/31/2014   Procedure: Midfoot Amputation;  Surgeon: Newt Minion, MD;  Location: [place];  Service: Orthopedics;  Laterality: Left;  . BYPASS GRAFT POPLITEAL TO TIBIAL Left 12/13/2012   Procedure: BYPASS GRAFT POPLITEAL TO TIBIAL;  Surgeon: Serafina Mitchell, MD;  Location: [facility];  Service: Vascular;  Laterality: Left;  Left Popliteal to Posterior Tibial Bypass Graft with reversed saphenous  vein graft  . CARDIAC CATHETERIZATION    . COLONOSCOPY [name] 04/14/2017   Procedure: COLONOSCOPY;  Surgeon: Danie Binder, MD;  [facility];  Service: Endoscopy;  Laterality: [name];  1000  . I & D EXTREMITY Left 12/08/2012   Procedure: IRRIGATION AND DEBRIDEMENT EXTREMITY;  Surgeon: Mcarthur Rossetti, MD;  Location: [place];  Service: Orthopedics;  Laterality: Left;  . I & D EXTREMITY Left 12/11/2012   Procedure: REPEAT I&D LEFT FOOT;  Surgeon: Mcarthur Rossetti, MD;  Location: [place];  Service: Orthopedics;  Laterality: Left;  . INCISION AND DRAINAGE Right 06/25/2013   Procedure: INCISION AND DRAINAGE RIGHT  FOOT;  Surgeon: Mcarthur Rossetti, MD;  Location: [facility];  Service: Orthopedics;  Laterality: Right;  . LEFT HEART CATHETERIZATION WITH CORONARY ANGIOGRAM [name] 06/28/2013   Procedure: LEFT HEART CATHETERIZATION WITH CORONARY ANGIOGRAM;  Surgeon: Burnell Blanks, MD;  Location: [facility] C[facility];  Service: Cardiovascular;  Laterality: [name];  . MASS EXCISION [name] 11/15/2017   Procedure: EXCISION 4CM CYST ON NECK;  Surgeon: Virl Cagey, MD;  Location: [facility];  Service: General;  Laterality: [name];  . PENILE PROSTHESIS IMPLANT    . POLYPECTOMY  04/14/2017   Procedure: POLYPECTOMY;  Surgeon: Danie Binder, MD;  [facility];  Service: Endoscopy;;  colon  . STUMP REVISION Right 08/16/2013   Procedure: STUMP REVISION- right Below Knee Amputation;  Surgeon: Newt Minion, MD;  Location: [place];  Service: Orthopedics;  Laterality: Right;  . TEE WITHOUT CARDIOVERSION [name] 07/03/2013   Procedure: TRANSESOPHAGEAL ECHOCARDIOGRAM (TEE);  Surgeon: Larey Dresser, MD;  Location: [place];  Service: Cardiovascular;  Laterality: [name];  . TONSILLECTOMY AND ADENOIDECTOMY       Allergies  Allergen Reactions  . Amoxicillin Nausea And Vomiting    Has patient had a PCN reaction causing immediate rash, facial/tongue/throat swelling, SOB or lightheadedness with hypotension:  No Has patient had a PCN reaction causing severe rash involving mucus membranes or skin necrosis: No Has patient had a PCN reaction that required hospitalization: No Has patient had a PCN reaction occurring within the last 10 years: Yes If all of the above answers are "NO", then may proceed with Cephalosporin use.       Family History  Problem Relation Age of Onset  . Pancreatic cancer Mother   . Diabetes Mother   . Hyperlipidemia Mother   . Diabetes Other   . Breast cancer Sister   . Depression Maternal Grandmother   . Heart disease Maternal Grandmother   . Depression Maternal Grandfather   . Heart disease Maternal Grandfather   . Depression Paternal Grandmother   . Heart disease Paternal Grandmother   . Depression Paternal Grandfather   . Heart disease Paternal Grandfather      Social History Albert [name] reports that he has never smoked. He has never used smokeless tobacco. Albert [name] reports no history of alcohol use.   Review of Systems CONSTITUTIONAL: No weight loss, fever, chills, weakness or fatigue.  HEENT: Eyes: No visual loss, blurred vision, double vision or yellow sclerae.No hearing loss, sneezing, congestion, runny nose or sore throat.  SKIN: No rash or itching.  CARDIOVASCULAR: per hpi RESPIRATORY: No shortness of breath, cough or sputum.  GASTROINTESTINAL: No anorexia, nausea, vomiting or diarrhea. No abdominal pain or blood.  GENITOURINARY: No burning on urination, no polyuria NEUROLOGICAL: No headache, dizziness, syncope, paralysis, ataxia, numbness or tingling in the extremities. No change in bowel or bladder control.  MUSCULOSKELETAL: No muscle, back pain, joint pain or stiffness.  LYMPHATICS: No enlarged nodes. No history of splenectomy.  PSYCHIATRIC: No history of depression or anxiety.  ENDOCRINOLOGIC: No reports of sweating, cold or heat intolerance. No polyuria or polydipsia.  Marland Kitchen   Physical Examination Today's Vitals   07/28/20 0904  BP: 110/78   Pulse: 66  SpO2: 98%  Weight: 266 lb 12.8 oz (121 kg)  Height: 5\' 11"  (1.803 m)   Body mass index is 37.21 kg/m.  Gen: resting comfortably, no acute distress HEENT: no scleral icterus, pupils equal round and reactive, no palptable cervical adenopathy,  CV: RRR, no m/r/g, no jvd Resp: Clear  to auscultation bilaterally GI: abdomen is soft, non-tender, non-distended, normal bowel sounds, no hepatosplenomegaly MSK: extremities are warm, no edema.  Skin: warm, no rash Neuro:  no focal deficits Psych: appropriate affect   Diagnostic Studies 06/2013 Cath Hemodynamic Findings: Central aortic pressure: 90/61 Left ventricular pressure: 90/19/29  Angiographic Findings:  Left main: Short segment without obstruction.   Left Anterior Descending Artery: Moderate caliber diffusely diseased vessel that courses to the apex. The proximal vessel has diffuse 40% stenosis. The mid vessel has a 70% stenosis followed by a 60% stenosis. The distal vessel has diffuse 90% stenosis. The diagonal is a moderate caliber vessel with ostial 99% stenosis with 99% disease extending into the mid vessel.   Circumflex Artery: Large caliber vessel with proximal 80-90% stenosis. There are three small to moderate caliber obtuse marginal branches. The first OM has mid 80% stenosis. The second OM has a proximal 99% stenosis. The third OM has diffuse 99% stenosis. The left sided posterolateral [facility] [place] has 99% stenosis.   Right Coronary Artery: Small non-dominant vessel with mid 99% stenosis.   Left Ventricular Angiogram: LVEF=40%  Impression: 1. Triple vessel CAD 2. NSTEMI secondary to demand ischemia from sepsis with severe underlying CAD 3. Mild LV systolic dysfunction  Recommendations: Complex situation. The only option for coronary revascularization is CABG. He is not a favorable candidate for CABG at this time with necrotic right foot, sepsis. Even though not ideal, would favor medical management of  CAD while undergoing treatment of his necrotic foot. Would proceed with right BKA this weekend. I have discussed the case with CT surgery who agrees that CABG is not a good option at this time. Will ask CT surgery to see after he recovers from his BKA. Continue ASA, beta blocker, statin.    Complications:None. The patient tolerated the procedure well.     11/2012 Carotid US Summary:  - No significant extracranial carotid artery stenosis demonstrated. Vertebrals are patent with antegrade flow. - ICA/CCA ratio= right = 0.94. left = 0.73. Other specific details can be found in the table(s) above.  Prepared and Electronically Authenticated by  03/2015 Carotid US IMPRESSION: Mild atherosclerotic disease in the carotid arteries, right side greater the left. The peak systolic velocity in the proximal right internal carotid artery is slightly elevated. Estimated degree of stenosis in the right internal carotid artery is close to 50%.  Estimated degree of stenosis in the left internal carotid artery is less than 50%.  Patent vertebral arteries.   12/07/15 Clinic EKG (performed and reviewed in clinic): NSR  01/2017 echo Study Conclusions  - Left ventricle: The cavity size was normal. Wall thickness was at the upper limits of normal. The estimated ejection fraction was 50%. Doppler parameters are consistent with abnormal left ventricular relaxation (grade 1 diastolic dysfunction). - Aortic valve: Mildly calcified annulus. Trileaflet. - Mitral valve: Calcified annulus. Mildly thickened leaflets . There was trivial regurgitation. - Tricuspid valve: There was trivial regurgitation. Peak RV-RA gradient (S): 21 mm Hg. - Pulmonary arteries: Systolic pressure could not be accurately estimated. - Pericardium, extracardiac: A trivial pericardial effusion was identified.  Impressions:  - Overall limited images. Upper normal LV wall thickness  with LVEF approximately 50% based on best views. Cannot exclude periapical wall motion abnormalities. Would suggest a limited study with Definity contrast. Calcified mitral annulus with mildly thickened leaflets and trivial mitral regurgitation. Trivial tricuspid regurgitation with RV-RA gradient 21 mmHg. Trivial pericardial effusion.    Assessment and Plan  1. Chronic systolic heart failure -LVEFhas  normalized based on most recent echo No symptoms, continue current meds  2. CAD -denies any recent symptoms, continue current meds - EKG today show SR, no acute ischemic chagnes   3. Hyperlipidemia - at goal, continue statin   4. HTN -at goal, continue current meds      Arnoldo Lenis, M.D.

## 2020-07-28 NOTE — Addendum Note (Signed)
Addended by: Julian Hy T on: 07/28/2020 09:47 AM   Modules accepted: Orders

## 2020-07-31 DIAGNOSIS — I10 Essential (primary) hypertension: Secondary | ICD-10-CM | POA: Diagnosis not present

## 2020-07-31 DIAGNOSIS — I110 Hypertensive heart disease with heart failure: Secondary | ICD-10-CM | POA: Diagnosis not present

## 2020-07-31 DIAGNOSIS — I5032 Chronic diastolic (congestive) heart failure: Secondary | ICD-10-CM | POA: Diagnosis not present

## 2020-07-31 DIAGNOSIS — E1165 Type 2 diabetes mellitus with hyperglycemia: Secondary | ICD-10-CM | POA: Diagnosis not present

## 2020-07-31 DIAGNOSIS — E7849 Other hyperlipidemia: Secondary | ICD-10-CM | POA: Diagnosis not present

## 2020-08-11 ENCOUNTER — Ambulatory Visit (INDEPENDENT_AMBULATORY_CARE_PROVIDER_SITE_OTHER): Payer: Medicare Other

## 2020-08-11 DIAGNOSIS — I6523 Occlusion and stenosis of bilateral carotid arteries: Secondary | ICD-10-CM

## 2020-08-12 ENCOUNTER — Telehealth: Payer: Self-pay | Admitting: *Deleted

## 2020-08-12 NOTE — Telephone Encounter (Signed)
-----   Message from Arnoldo Lenis, MD sent at 08/11/2020  1:52 PM EDT ----- Carotid US shows just mild bilaterlal disease   Zandra Abts MD

## 2020-08-12 NOTE — Telephone Encounter (Signed)
Pt voiced understanding

## 2020-08-26 ENCOUNTER — Telehealth: Payer: Self-pay | Admitting: *Deleted

## 2020-08-26 DIAGNOSIS — I110 Hypertensive heart disease with heart failure: Secondary | ICD-10-CM | POA: Diagnosis not present

## 2020-08-26 DIAGNOSIS — I5032 Chronic diastolic (congestive) heart failure: Secondary | ICD-10-CM | POA: Diagnosis not present

## 2020-08-26 DIAGNOSIS — E7849 Other hyperlipidemia: Secondary | ICD-10-CM | POA: Diagnosis not present

## 2020-08-26 DIAGNOSIS — I10 Essential (primary) hypertension: Secondary | ICD-10-CM | POA: Diagnosis not present

## 2020-08-26 DIAGNOSIS — E1165 Type 2 diabetes mellitus with hyperglycemia: Secondary | ICD-10-CM | POA: Diagnosis not present

## 2020-08-26 NOTE — Telephone Encounter (Signed)
-----   Message from Arnoldo Lenis, MD sent at 08/25/2020 10:32 AM EST ----- Carotid US shows just mild bilateral blockages, continue to monitor.    Zandra Abts MD

## 2020-08-27 DIAGNOSIS — R110 Nausea: Secondary | ICD-10-CM | POA: Diagnosis not present

## 2020-08-27 DIAGNOSIS — I110 Hypertensive heart disease with heart failure: Secondary | ICD-10-CM | POA: Diagnosis not present

## 2020-08-27 DIAGNOSIS — L84 Corns and callosities: Secondary | ICD-10-CM | POA: Diagnosis not present

## 2020-08-27 DIAGNOSIS — E1165 Type 2 diabetes mellitus with hyperglycemia: Secondary | ICD-10-CM | POA: Diagnosis not present

## 2020-08-27 DIAGNOSIS — I5032 Chronic diastolic (congestive) heart failure: Secondary | ICD-10-CM | POA: Diagnosis not present

## 2020-08-27 DIAGNOSIS — L03116 Cellulitis of left lower limb: Secondary | ICD-10-CM | POA: Diagnosis not present

## 2020-08-27 DIAGNOSIS — N390 Urinary tract infection, site not specified: Secondary | ICD-10-CM | POA: Diagnosis not present

## 2020-08-27 DIAGNOSIS — R300 Dysuria: Secondary | ICD-10-CM | POA: Diagnosis not present

## 2020-08-27 NOTE — Telephone Encounter (Signed)
Pt aware.

## 2020-10-16 DIAGNOSIS — I5032 Chronic diastolic (congestive) heart failure: Secondary | ICD-10-CM | POA: Diagnosis not present

## 2020-10-16 DIAGNOSIS — E1165 Type 2 diabetes mellitus with hyperglycemia: Secondary | ICD-10-CM | POA: Diagnosis not present

## 2020-10-16 DIAGNOSIS — I110 Hypertensive heart disease with heart failure: Secondary | ICD-10-CM | POA: Diagnosis not present

## 2020-10-16 DIAGNOSIS — I10 Essential (primary) hypertension: Secondary | ICD-10-CM | POA: Diagnosis not present

## 2020-10-16 DIAGNOSIS — E7849 Other hyperlipidemia: Secondary | ICD-10-CM | POA: Diagnosis not present

## 2020-10-26 DIAGNOSIS — L84 Corns and callosities: Secondary | ICD-10-CM | POA: Diagnosis not present

## 2020-10-26 DIAGNOSIS — E1165 Type 2 diabetes mellitus with hyperglycemia: Secondary | ICD-10-CM | POA: Diagnosis not present

## 2020-10-26 DIAGNOSIS — I110 Hypertensive heart disease with heart failure: Secondary | ICD-10-CM | POA: Diagnosis not present

## 2020-10-26 DIAGNOSIS — E1122 Type 2 diabetes mellitus with diabetic chronic kidney disease: Secondary | ICD-10-CM | POA: Diagnosis not present

## 2020-10-26 DIAGNOSIS — R110 Nausea: Secondary | ICD-10-CM | POA: Diagnosis not present

## 2020-10-26 DIAGNOSIS — I5032 Chronic diastolic (congestive) heart failure: Secondary | ICD-10-CM | POA: Diagnosis not present

## 2020-10-26 DIAGNOSIS — L03116 Cellulitis of left lower limb: Secondary | ICD-10-CM | POA: Diagnosis not present

## 2020-10-29 DIAGNOSIS — E782 Mixed hyperlipidemia: Secondary | ICD-10-CM | POA: Diagnosis not present

## 2020-10-29 DIAGNOSIS — E1165 Type 2 diabetes mellitus with hyperglycemia: Secondary | ICD-10-CM | POA: Diagnosis not present

## 2020-10-29 DIAGNOSIS — K219 Gastro-esophageal reflux disease without esophagitis: Secondary | ICD-10-CM | POA: Diagnosis not present

## 2020-10-29 DIAGNOSIS — N182 Chronic kidney disease, stage 2 (mild): Secondary | ICD-10-CM | POA: Diagnosis not present

## 2020-10-29 DIAGNOSIS — I5032 Chronic diastolic (congestive) heart failure: Secondary | ICD-10-CM | POA: Diagnosis not present

## 2020-10-29 DIAGNOSIS — L84 Corns and callosities: Secondary | ICD-10-CM | POA: Diagnosis not present

## 2020-10-29 DIAGNOSIS — Z89511 Acquired absence of right leg below knee: Secondary | ICD-10-CM | POA: Diagnosis not present

## 2020-10-29 DIAGNOSIS — I10 Essential (primary) hypertension: Secondary | ICD-10-CM | POA: Diagnosis not present

## 2020-10-29 DIAGNOSIS — D649 Anemia, unspecified: Secondary | ICD-10-CM | POA: Diagnosis not present

## 2020-10-29 DIAGNOSIS — I2510 Atherosclerotic heart disease of native coronary artery without angina pectoris: Secondary | ICD-10-CM | POA: Diagnosis not present

## 2020-12-10 DIAGNOSIS — L84 Corns and callosities: Secondary | ICD-10-CM | POA: Diagnosis not present

## 2020-12-10 DIAGNOSIS — I110 Hypertensive heart disease with heart failure: Secondary | ICD-10-CM | POA: Diagnosis not present

## 2020-12-10 DIAGNOSIS — N390 Urinary tract infection, site not specified: Secondary | ICD-10-CM | POA: Diagnosis not present

## 2020-12-10 DIAGNOSIS — L03116 Cellulitis of left lower limb: Secondary | ICD-10-CM | POA: Diagnosis not present

## 2020-12-10 DIAGNOSIS — I5032 Chronic diastolic (congestive) heart failure: Secondary | ICD-10-CM | POA: Diagnosis not present

## 2020-12-10 DIAGNOSIS — R3915 Urgency of urination: Secondary | ICD-10-CM | POA: Diagnosis not present

## 2020-12-10 DIAGNOSIS — N3001 Acute cystitis with hematuria: Secondary | ICD-10-CM | POA: Diagnosis not present

## 2020-12-10 DIAGNOSIS — R110 Nausea: Secondary | ICD-10-CM | POA: Diagnosis not present

## 2020-12-10 DIAGNOSIS — R300 Dysuria: Secondary | ICD-10-CM | POA: Diagnosis not present

## 2020-12-14 DIAGNOSIS — E1165 Type 2 diabetes mellitus with hyperglycemia: Secondary | ICD-10-CM | POA: Diagnosis not present

## 2020-12-14 DIAGNOSIS — I10 Essential (primary) hypertension: Secondary | ICD-10-CM | POA: Diagnosis not present

## 2020-12-14 DIAGNOSIS — E876 Hypokalemia: Secondary | ICD-10-CM | POA: Diagnosis not present

## 2021-01-13 DIAGNOSIS — E876 Hypokalemia: Secondary | ICD-10-CM | POA: Diagnosis not present

## 2021-01-13 DIAGNOSIS — E1165 Type 2 diabetes mellitus with hyperglycemia: Secondary | ICD-10-CM | POA: Diagnosis not present

## 2021-01-13 DIAGNOSIS — I10 Essential (primary) hypertension: Secondary | ICD-10-CM | POA: Diagnosis not present

## 2021-01-21 DIAGNOSIS — I110 Hypertensive heart disease with heart failure: Secondary | ICD-10-CM | POA: Diagnosis not present

## 2021-01-21 DIAGNOSIS — D649 Anemia, unspecified: Secondary | ICD-10-CM | POA: Diagnosis not present

## 2021-01-21 DIAGNOSIS — E1165 Type 2 diabetes mellitus with hyperglycemia: Secondary | ICD-10-CM | POA: Diagnosis not present

## 2021-01-21 DIAGNOSIS — I5032 Chronic diastolic (congestive) heart failure: Secondary | ICD-10-CM | POA: Diagnosis not present

## 2021-01-21 DIAGNOSIS — N182 Chronic kidney disease, stage 2 (mild): Secondary | ICD-10-CM | POA: Diagnosis not present

## 2021-01-26 DIAGNOSIS — L84 Corns and callosities: Secondary | ICD-10-CM | POA: Diagnosis not present

## 2021-01-26 DIAGNOSIS — I2510 Atherosclerotic heart disease of native coronary artery without angina pectoris: Secondary | ICD-10-CM | POA: Diagnosis not present

## 2021-01-26 DIAGNOSIS — D649 Anemia, unspecified: Secondary | ICD-10-CM | POA: Diagnosis not present

## 2021-01-26 DIAGNOSIS — I5032 Chronic diastolic (congestive) heart failure: Secondary | ICD-10-CM | POA: Diagnosis not present

## 2021-01-26 DIAGNOSIS — E1165 Type 2 diabetes mellitus with hyperglycemia: Secondary | ICD-10-CM | POA: Diagnosis not present

## 2021-01-26 DIAGNOSIS — N182 Chronic kidney disease, stage 2 (mild): Secondary | ICD-10-CM | POA: Diagnosis not present

## 2021-01-26 DIAGNOSIS — I10 Essential (primary) hypertension: Secondary | ICD-10-CM | POA: Diagnosis not present

## 2021-01-26 DIAGNOSIS — K219 Gastro-esophageal reflux disease without esophagitis: Secondary | ICD-10-CM | POA: Diagnosis not present

## 2021-01-26 DIAGNOSIS — Z89511 Acquired absence of right leg below knee: Secondary | ICD-10-CM | POA: Diagnosis not present

## 2021-01-26 DIAGNOSIS — E782 Mixed hyperlipidemia: Secondary | ICD-10-CM | POA: Diagnosis not present

## 2021-01-27 ENCOUNTER — Other Ambulatory Visit: Payer: Self-pay

## 2021-01-27 ENCOUNTER — Encounter: Payer: Self-pay | Admitting: Cardiology

## 2021-01-27 ENCOUNTER — Ambulatory Visit (INDEPENDENT_AMBULATORY_CARE_PROVIDER_SITE_OTHER): Payer: Medicare Other | Admitting: Cardiology

## 2021-01-27 VITALS — BP 118/80 | HR 72 | Ht 71.0 in | Wt 263.8 lb

## 2021-01-27 DIAGNOSIS — E782 Mixed hyperlipidemia: Secondary | ICD-10-CM | POA: Diagnosis not present

## 2021-01-27 DIAGNOSIS — I10 Essential (primary) hypertension: Secondary | ICD-10-CM

## 2021-01-27 DIAGNOSIS — I5022 Chronic systolic (congestive) heart failure: Secondary | ICD-10-CM | POA: Diagnosis not present

## 2021-01-27 DIAGNOSIS — I2510 Atherosclerotic heart disease of native coronary artery without angina pectoris: Secondary | ICD-10-CM

## 2021-01-27 NOTE — Progress Notes (Signed)
Clinical Summary [name] is a 60 y.o.male seen today for follow up of the following medical problems.   1. Chronic combined systolic/diastolic heart failure - ICM, LVEF 40-45% by echo 12/2013, grade II diastolic dysfunction - 02/2840 LVEF 32%, grade I diastolic dysfunction     - no recent SOB/DOE. No recent edema - compliant with meds - home weights 257-258 lbs and stable  2. CAD - hx of demand ischemia 06/2013 in setting of sepsis with necrotic foot wound. - cath at that time 06/2013 LM patent, LAD 40% prox, 70% mid follow by 60% lesion, distal 90%. Diag 99%. LCX 80-90% prox, OM1 80%, OM2 99%, OM3 99 % (all OMs are small), RCA small non-dom mid 99%. LVEF 40% by LVgram.  - CAD was managed medically after discussions with CT surgery at that time, with plans to reevaluate once recurring infection issues resolved.   - no recent chest pain  4. PAD - followed by vascular and [facility] - previous right BKA, previous multiple toe amputations. 08/30/14 treated for osteo and ulcer of left great toe with amputation. Jan 2016 had left midfoot amputation.  - Followed by ortho Dr Albert [name].     5. Hyperlipidemia  - 06/2020 TC 106 TG 106 HDL 29 LDL 57 -01/2021 TC 101 TG 78 HDL 26 LDL 59 - compliant with statin  6. HTN - he is compliant with meds  7. Carotid stenosis  07/2020 mild bilateral disease  SH: Had johnson and johnson covid vaccine [place] with recent severe case of viral meningitis, still recovering Brother in law recently passed from stroke.    Past Medical History:  Diagnosis Date  . Allergic rhinitis   . Anemia    takes supplement  . Chronic cough   . Chronic sinusitis   . Complication of anesthesia    couldn't swallow and talk  . Coronary artery disease   . CVA (cerebral infarction) 2011   R hand deficit  . Diabetes mellitus without complication ([place]) ~4401   last HbA1c ~9 .... fasting 160s  . GERD (gastroesophageal reflux disease)    . Heart murmur   . Hypercholesteremia   . Hypertension   . Myocardial infarction ([place]) 2014  . Osteomyelitis ([place])    first and third metatarsal  . Pneumonia    hx of walking  . Splenic infarct    setting of lupus anticoagulant  . Stroke [facility]) 2011   right arm weakness     Allergies  Allergen Reactions  . Amoxicillin Nausea And Vomiting    Has patient had a PCN reaction causing immediate rash, facial/tongue/throat swelling, SOB or lightheadedness with hypotension: No Has patient had a PCN reaction causing severe rash involving mucus membranes or skin necrosis: No Has patient had a PCN reaction that required hospitalization: No Has patient had a PCN reaction occurring within the last 10 years: Yes If all of the above answers are "NO", then may proceed with Cephalosporin use.      Current Outpatient Medications  Medication Sig Dispense Refill  . aspirin EC 81 MG tablet Take 1 tablet (81 mg total) by mouth daily. 30 tablet 1  . atorvastatin (LIPITOR) 80 MG tablet Take 80 mg by mouth every other day.     . carvedilol (COREG) 25 MG tablet Take 25 mg by mouth 2 (two) times daily.     . clotrimazole-betamethasone (LOTRISONE) cream Apply 1 application topically 2 (two) times daily. 30 g 2  . furosemide (LASIX) 80 MG  tablet Take 80 mg by mouth daily. May take additional 40 mg as needed for weight gain    . hydrALAZINE (APRESOLINE) 10 MG tablet Take 1 tablet (10 mg total) by mouth 3 (three) times daily. 120 tablet 1  . Insulin Lispro (HUMALOG KWIKPEN [place]) Inject 25 Units into the skin in the morning, at noon, and at bedtime.    Marland Kitchen losartan (COZAAR) 50 MG tablet TAKE 1 TABLET BY MOUTH  DAILY 90 tablet 0  . metFORMIN (GLUCOPHAGE-XR) 500 MG 24 hr tablet Take 1 tablet by mouth in the morning and at bedtime.     . mupirocin ointment (BACTROBAN) 2 % Apply 1 application topically daily. (Patient taking differently: Apply 1 application topically once a week. Apply to left foot) 22 g 6  .  nitroGLYCERIN (NITRODUR - DOSED IN MG/24 HR) 0.2 mg/hr patch APPLY 1 PATCH ONTO SKIN  DAILY 90 patch 3  . pantoprazole (PROTONIX) 40 MG tablet Take 1 tablet (40 mg total) by mouth daily. 30 tablet 1  . pentoxifylline (TRENTAL) 400 MG CR tablet Take 1 tablet (400 mg total) by mouth 3 (three) times daily with meals. 90 tablet 11  . potassium chloride (K-DUR,KLOR-CON) 10 MEQ tablet Take 1 tablet (10 mEq total) by mouth daily. 30 tablet 6  . TRESIBA FLEXTOUCH 100 UNIT/ML SOPN FlexTouch Pen Inject 57 Units into the skin daily.  6  . TRUVADA 200-300 MG tablet Take 1 tablet by mouth daily.  5   No current facility-administered medications for this visit.     Past Surgical History:  Procedure Laterality Date  . 4th Toe Amputation Left Jan. 2014   4th toe in [place]  . AMPUTATION Left 12/08/2012   Procedure: AMPUTATION DIGIT;  Surgeon: Mcarthur Rossetti, MD;  Location: [facility];  Service: Orthopedics;  Laterality: Left;  3rd toe amputation possible 5th toe amputation  . AMPUTATION Left 12/11/2012   Procedure: Amputation of 2nd Toe;  Surgeon: Mcarthur Rossetti, MD;  Location: [place];  Service: Orthopedics;  Laterality: Left;  . AMPUTATION Right 06/29/2013   Procedure: AMPUTATION BELOW KNEE;  Surgeon: Newt Minion, MD;  Location: [place];  Service: Orthopedics;  Laterality: Right;  . AMPUTATION Left 08/29/2014   Procedure: Left Great Toe Amputation at MTP;  Surgeon: Newt Minion, MD;  Location: [facility];  Service: Orthopedics;  Laterality: Left;  . AMPUTATION Left 10/31/2014   Procedure: Midfoot Amputation;  Surgeon: Newt Minion, MD;  Location: [place];  Service: Orthopedics;  Laterality: Left;  . BYPASS GRAFT POPLITEAL TO TIBIAL Left 12/13/2012   Procedure: BYPASS GRAFT POPLITEAL TO TIBIAL;  Surgeon: Serafina Mitchell, MD;  [facility]: [facility];  Service: Vascular;  Laterality: Left;  Left Popliteal to Posterior Tibial Bypass Graft with reversed saphenous vein graft  . CARDIAC CATHETERIZATION    . COLONOSCOPY  [name] 04/14/2017   Procedure: COLONOSCOPY;  Surgeon: Danie Binder, MD;  [facility];  Service: Endoscopy;  Laterality: [name];  1000  . I & D EXTREMITY Left 12/08/2012   Procedure: IRRIGATION AND DEBRIDEMENT EXTREMITY;  Surgeon: Mcarthur Rossetti, MD;  Location: [place];  Service: Orthopedics;  Laterality: Left;  . I & D EXTREMITY Left 12/11/2012   Procedure: REPEAT I&D LEFT FOOT;  Surgeon: Mcarthur Rossetti, MD;  Location: [place];  Service: Orthopedics;  Laterality: Left;  . INCISION AND DRAINAGE Right 06/25/2013   Procedure: INCISION AND DRAINAGE RIGHT FOOT;  Surgeon: Mcarthur Rossetti, MD;  Location: [facility];  Service: Orthopedics;  Laterality: Right;  . LEFT HEART CATHETERIZATION WITH CORONARY ANGIOGRAM [name] 06/28/2013   Procedure: LEFT HEART CATHETERIZATION WITH CORONARY ANGIOGRAM;  Surgeon: Burnell Blanks, MD;  Location: [facility];  Service: Cardiovascular;  Laterality: [name];  . MASS EXCISION [name] 11/15/2017   Procedure: EXCISION 4CM CYST ON NECK;  Surgeon: Virl Cagey, MD;  Location: [facility];  Service: General;  Laterality: [name];  . PENILE PROSTHESIS IMPLANT    . POLYPECTOMY  04/14/2017   Procedure: POLYPECTOMY;  Surgeon: Danie Binder, MD;  [facility];  Service: Endoscopy;;  colon  . STUMP REVISION Right 08/16/2013   Procedure: STUMP REVISION- right Below Knee Amputation;  Surgeon: Newt Minion, MD;  Location: [place] [facility];  Service: Orthopedics;  Laterality: Right;  . TEE WITHOUT CARDIOVERSION [name] 07/03/2013   Procedure: TRANSESOPHAGEAL ECHOCARDIOGRAM (TEE);  Surgeon: Larey Dresser, MD;  Location: [facility] [facility];  Service: Cardiovascular;  Laterality: [name];  . TONSILLECTOMY AND ADENOIDECTOMY       Allergies  Allergen Reactions  . Amoxicillin Nausea And Vomiting    Has patient had a PCN reaction causing immediate rash, facial/tongue/throat swelling, SOB or lightheadedness with hypotension: No Has patient had a PCN reaction causing severe rash  involving mucus membranes or skin necrosis: No Has patient had a PCN reaction that required hospitalization: No Has patient had a PCN reaction occurring within the last 10 years: Yes If all of the above answers are "NO", then may proceed with Cephalosporin use.       Family History  Problem Relation Age of Onset  . Pancreatic cancer Mother   . Diabetes Mother   . Hyperlipidemia Mother   . Diabetes Other   . Breast cancer Sister   . Depression Maternal Grandmother   . Heart disease Maternal Grandmother   . Depression Maternal Grandfather   . Heart disease Maternal Grandfather   . Depression Paternal Grandmother   . Heart disease Paternal Grandmother   . Depression Paternal Grandfather   . Heart disease Paternal Grandfather      Social History Mr. Sliney reports that he has never smoked. He has never used smokeless tobacco. Mr. Trimpe reports no history of alcohol use.   Review of Systems CONSTITUTIONAL: No weight loss, fever, chills, weakness or fatigue.  HEENT: Eyes: No visual loss, blurred vision, double vision or yellow sclerae.No hearing loss, sneezing, congestion, runny nose or sore throat.  SKIN: No rash or itching.  CARDIOVASCULAR: per hpi RESPIRATORY: No shortness of breath, cough or sputum.  GASTROINTESTINAL: No anorexia, nausea, vomiting or diarrhea. No abdominal pain or blood.  GENITOURINARY: No burning on urination, no polyuria NEUROLOGICAL: No headache, dizziness, syncope, paralysis, ataxia, numbness or tingling in the extremities. No change in bowel or bladder control.  MUSCULOSKELETAL: No muscle, back pain, joint pain or stiffness.  LYMPHATICS: No enlarged nodes. No history of splenectomy.  PSYCHIATRIC: No history of depression or anxiety.  ENDOCRINOLOGIC: No reports of sweating, cold or heat intolerance. No polyuria or polydipsia.  Marland Kitchen   Physical Examination Today's Vitals   01/27/21 0953  BP: 118/80  Pulse: 72  SpO2: 98%  Weight: 263 lb 12.8 oz (119.7  kg)  Height: 5\' 11"  (1.803 m)   Body mass index is 36.79 kg/m.  Gen: resting comfortably, no acute distress HEENT: no scleral icterus, pupils equal round and reactive, no palptable cervical adenopathy,  CV: RRR, no m/r/g no jvd Resp: Clear to auscultation bilaterally GI: abdomen is soft, non-tender, non-distended, normal bowel sounds,  no hepatosplenomegaly MSK: extremities are warm, no edema.  Skin: warm, no rash Neuro:  no focal deficits Psych: appropriate affect   Diagnostic Studies 06/2013 Cath Hemodynamic Findings: Central aortic pressure: 90/61 Left ventricular pressure: 90/19/29  Angiographic Findings:  Left main: Short segment without obstruction.   Left Anterior Descending Artery: Moderate caliber diffusely diseased vessel that courses to the apex. The proximal vessel has diffuse 40% stenosis. The mid vessel has a 70% stenosis followed by a 60% stenosis. The distal vessel has diffuse 90% stenosis. The diagonal is a moderate caliber vessel with ostial 99% stenosis with 99% disease extending into the mid vessel.   Circumflex Artery: Large caliber vessel with proximal 80-90% stenosis. There are three small to moderate caliber obtuse marginal branches. The first OM has mid 80% stenosis. The second OM has a proximal 99% stenosis. The third OM has diffuse 99% stenosis. The left sided posterolateral Lakela Kuba has 99% stenosis.   Right Coronary Artery: Small non-dominant vessel with mid 99% stenosis.   Left Ventricular Angiogram: LVEF=40%  Impression: 1. Triple vessel CAD 2. NSTEMI secondary to demand ischemia from sepsis with severe underlying CAD 3. Mild LV systolic dysfunction  Recommendations: Complex situation. The only option for coronary revascularization is CABG. He is not a favorable candidate for CABG at this time with necrotic right foot, sepsis. Even though not ideal, would favor medical management of CAD while undergoing treatment of his necrotic foot.  Would proceed with right BKA this weekend. I have discussed the case with CT surgery who agrees that CABG is not a good option at this time. Will ask CT surgery to see after he recovers from his BKA. Continue ASA, beta blocker, statin.    Complications:None. The patient tolerated the procedure well.     11/2012 Carotid US Summary:  - No significant extracranial carotid artery stenosis demonstrated. Vertebrals are patent with antegrade flow. - ICA/CCA ratio= right = 0.94. left = 0.73. Other specific details can be found in the table(s) above.  Prepared and Electronically Authenticated by  03/2015 Carotid US IMPRESSION: Mild atherosclerotic disease in the carotid arteries, right side greater the left. The peak systolic velocity in the proximal right internal carotid artery is slightly elevated. Estimated degree of stenosis in the right internal carotid artery is close to 50%.  Estimated degree of stenosis in the left internal carotid artery is less than 50%.  Patent vertebral arteries.   12/07/15 Clinic EKG (performed and reviewed in clinic): NSR  01/2017 echo Study Conclusions  - Left ventricle: The cavity size was normal. Wall thickness was at the upper limits of normal. The estimated ejection fraction was 50%. Doppler parameters are consistent with abnormal left ventricular relaxation (grade 1 diastolic dysfunction). - Aortic valve: Mildly calcified annulus. Trileaflet. - Mitral valve: Calcified annulus. Mildly thickened leaflets . There was trivial regurgitation. - Tricuspid valve: There was trivial regurgitation. Peak RV-RA gradient (S): 21 mm Hg. - Pulmonary arteries: Systolic pressure could not be accurately estimated. - Pericardium, extracardiac: A trivial pericardial effusion was identified.  Impressions:  - Overall limited images. Upper normal LV wall thickness with LVEF approximately 50% based on best views.  Cannot exclude periapical wall motion abnormalities. Would suggest a limited study with Definity contrast. Calcified mitral annulus with mildly thickened leaflets and trivial mitral regurgitation. Trivial tricuspid regurgitation with RV-RA gradient 21 mmHg. Trivial pericardial effusion.     Assessment and Plan  1. Chronic systolic heart failure -LVEFhas normalized based on most recent echo - no symptoms, appears euvolemic -  continue current meds  2. CAD -no symptoms, continue current meds   3. Hyperlipidemia - lipids at goal, continue statin   4. HTN -bp is at goal, continue current meds  F/u 6 months     Arnoldo Lenis, M.D.

## 2021-01-27 NOTE — Patient Instructions (Signed)
Medication Instructions:  Continue all current medications.   Labwork: none  Testing/Procedures: none  Follow-Up: 6 months   Any Other Special Instructions Will Be Listed Below (If Applicable).   If you need a refill on your cardiac medications before your next appointment, please call your pharmacy.  

## 2021-04-08 DIAGNOSIS — R309 Painful micturition, unspecified: Secondary | ICD-10-CM | POA: Diagnosis not present

## 2021-04-08 DIAGNOSIS — R300 Dysuria: Secondary | ICD-10-CM | POA: Insufficient documentation

## 2021-04-22 IMAGING — DX LUMBAR SPINE - 2-3 VIEW
3 series · 3 of 3 positions shown · non-contrast
Comparison: CT [facility] 06/30/2014

CLINICAL DATA: Back pain.

EXAM:
LUMBAR SPINE - 2-3 VIEW

[l-spine ap]
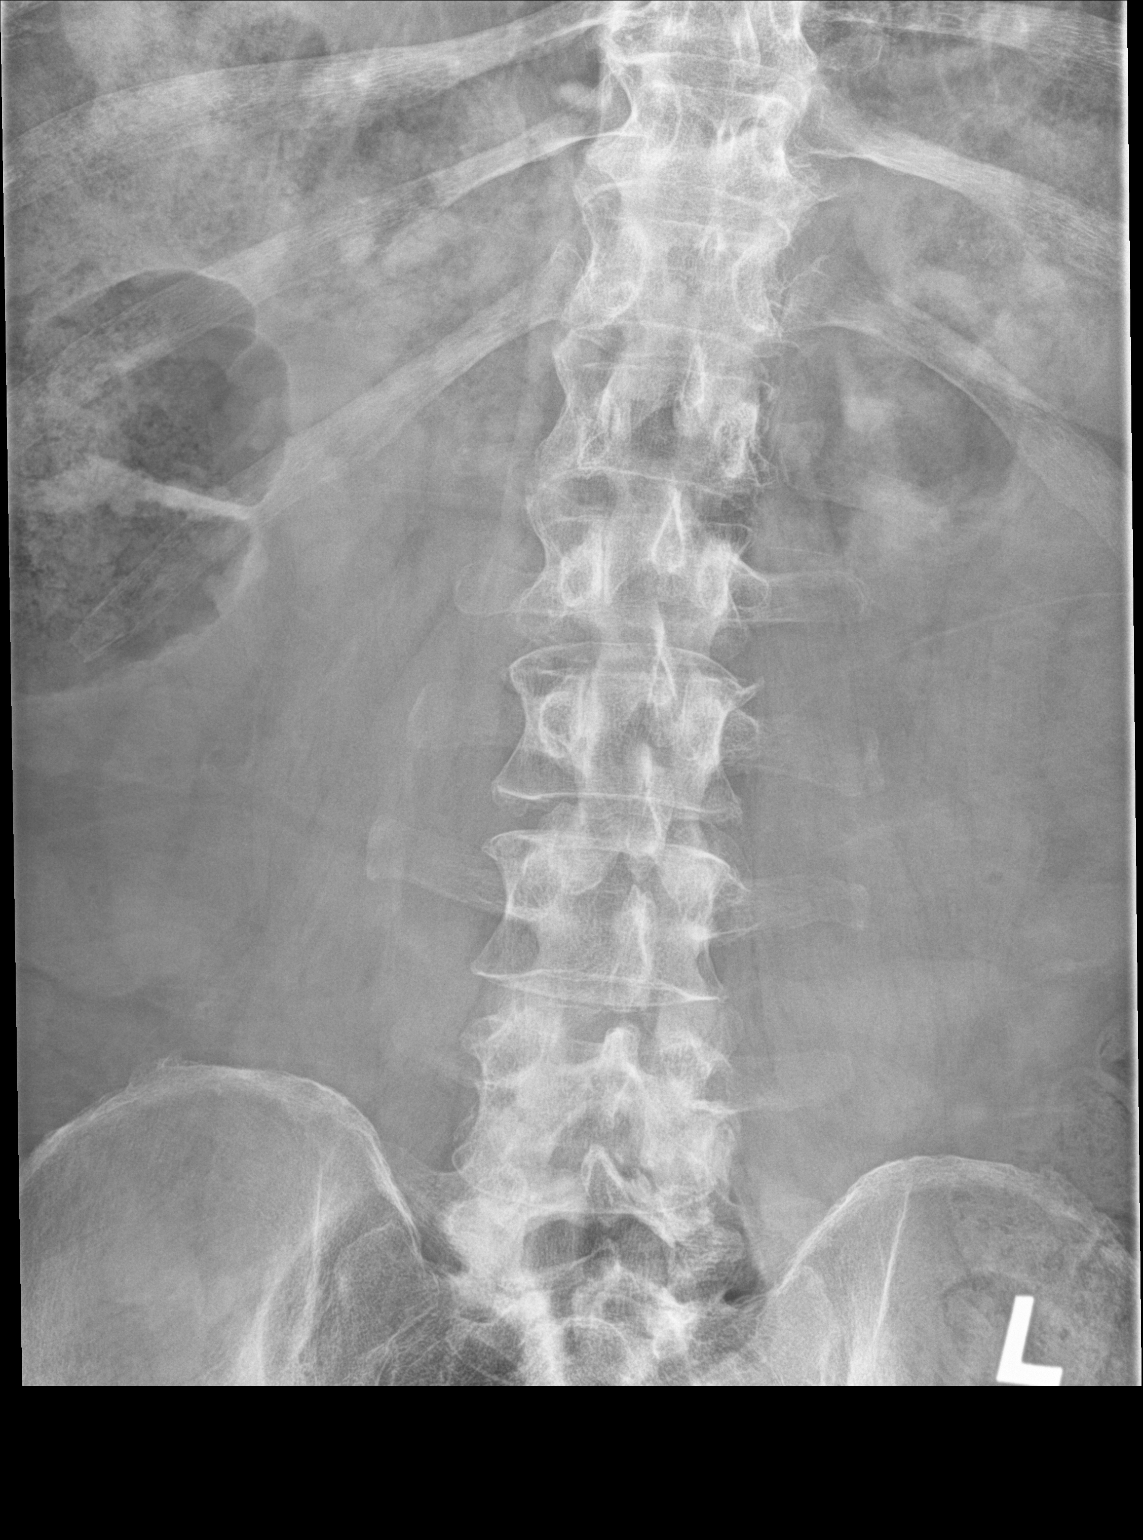

[l-spine lat (1 of 2)]
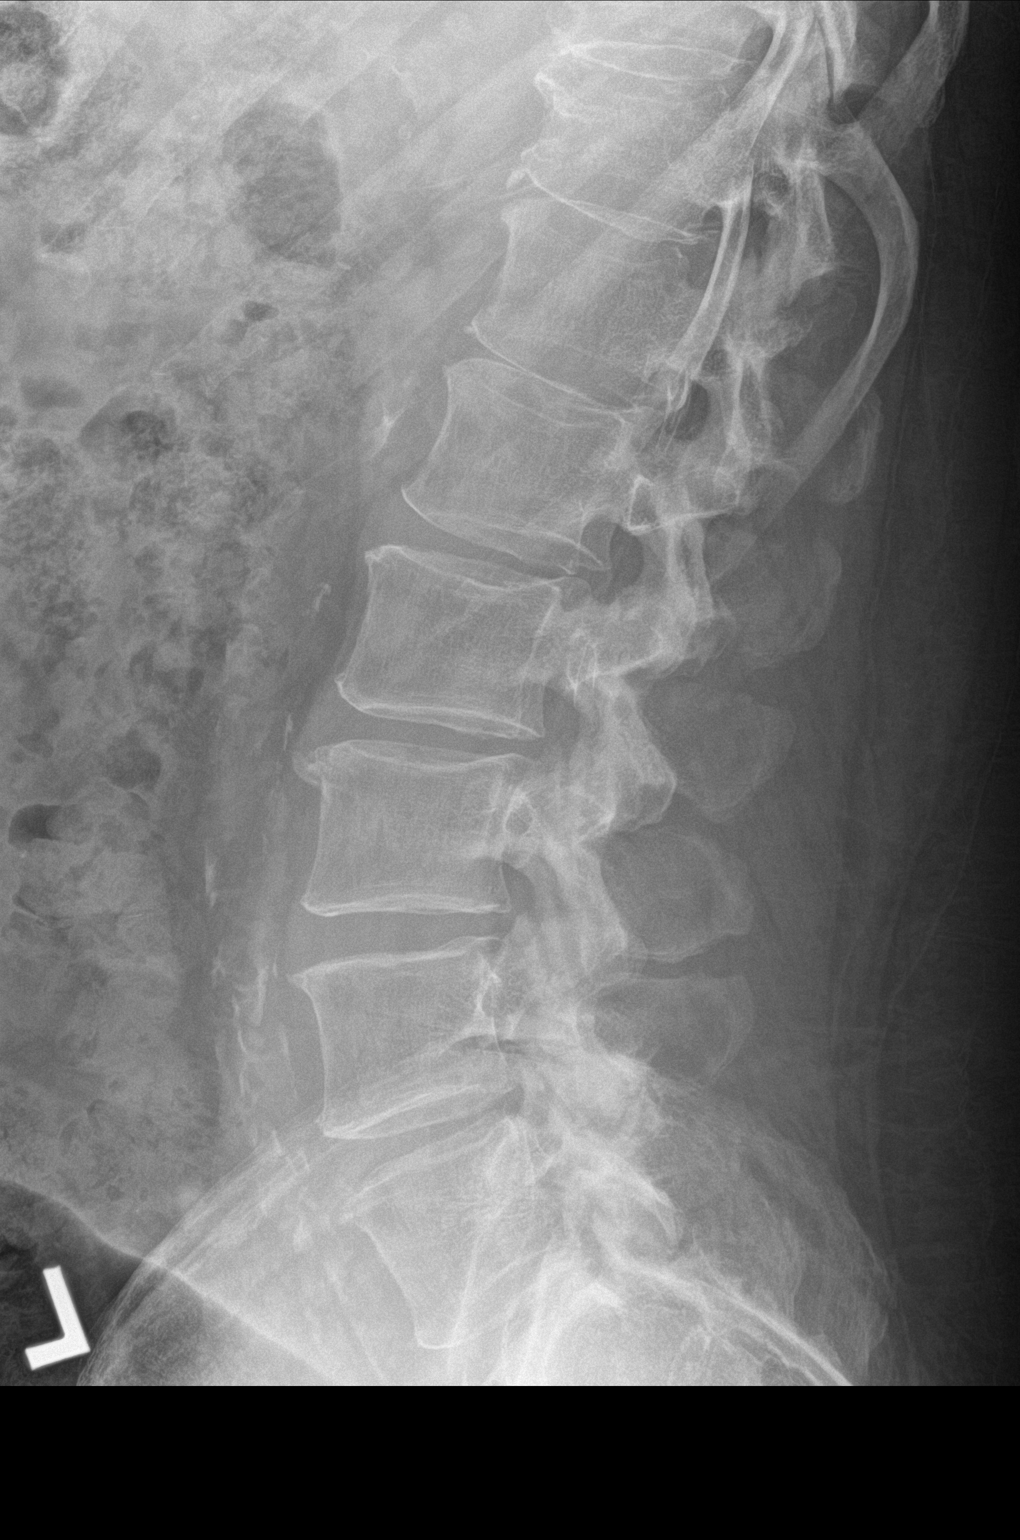

[l-spine lat (2 of 2)]
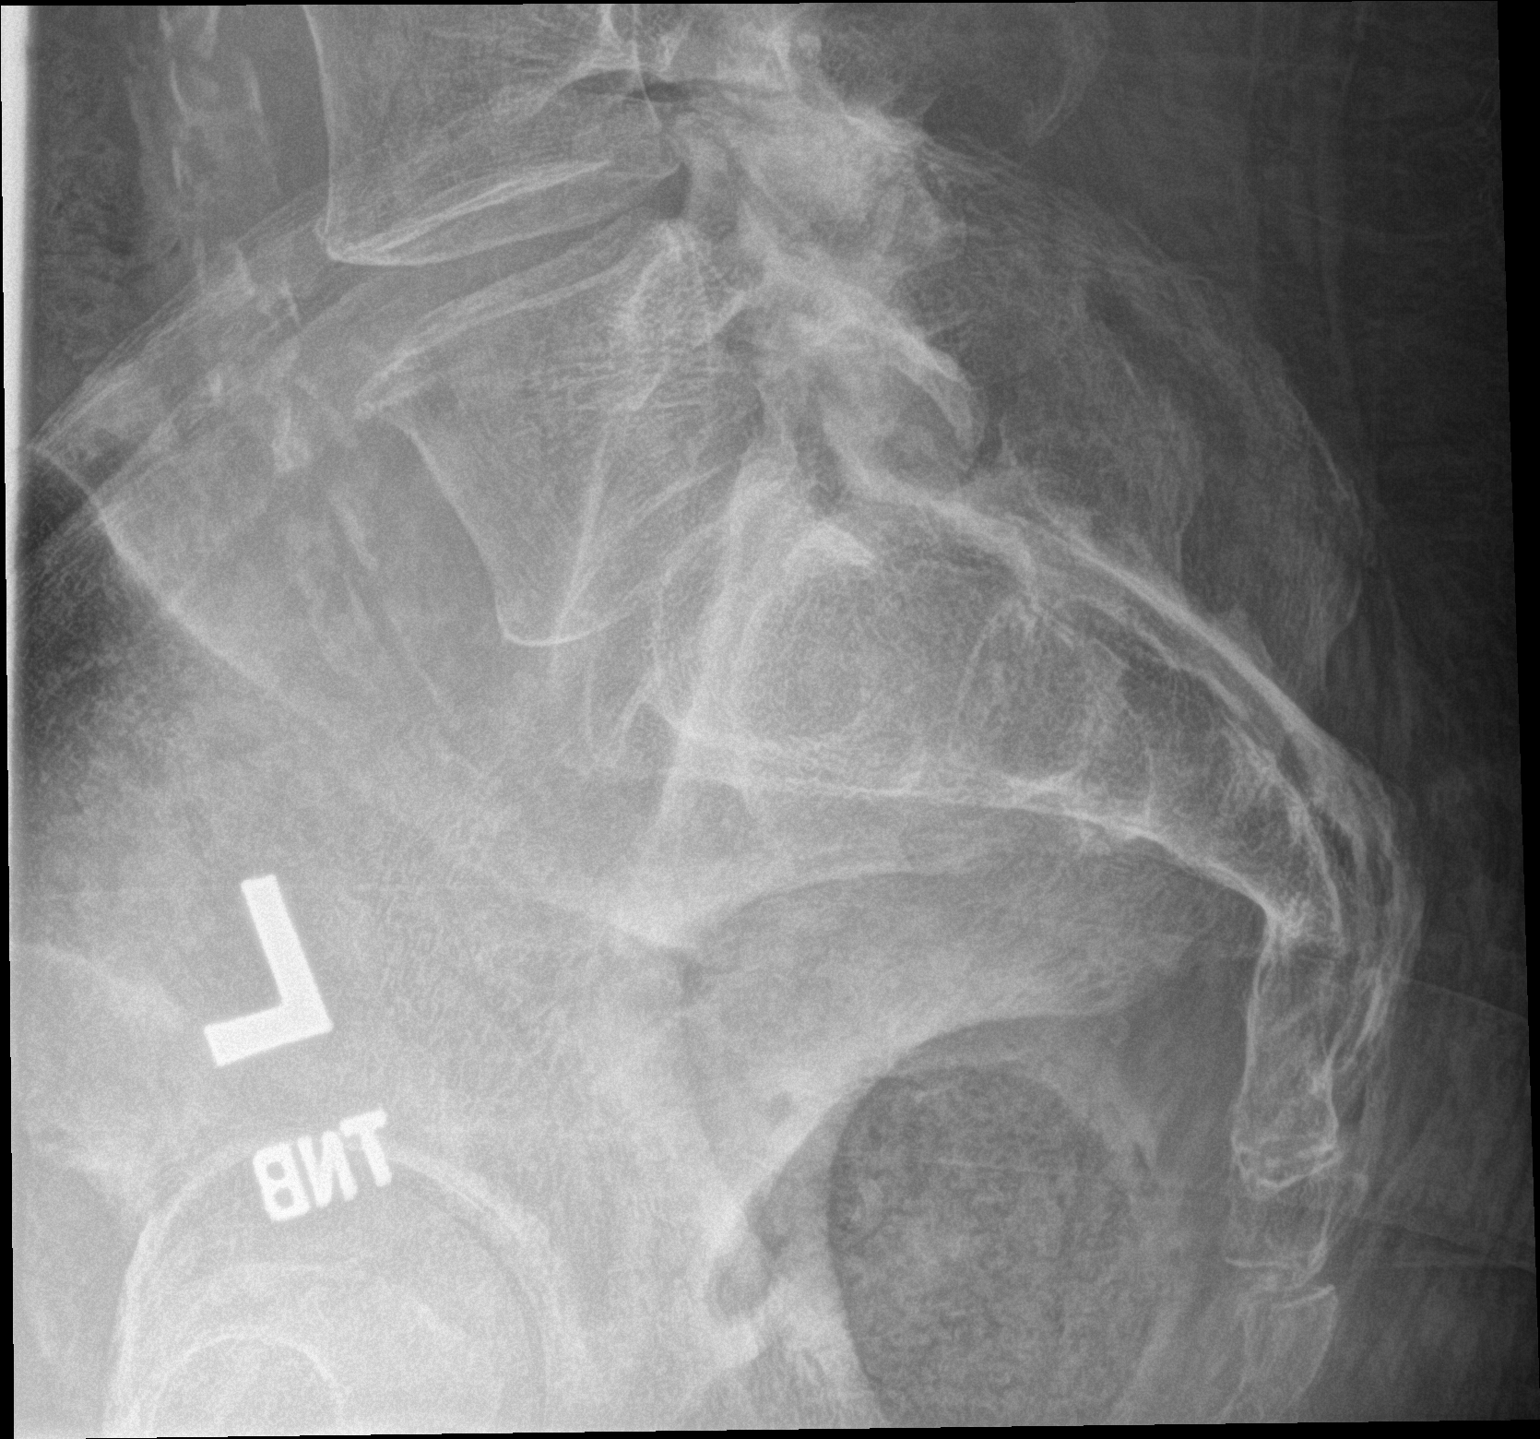

[3 of 3 positions shown; findings below may reference images not displayed]

FINDINGS: Normal alignment of the lumbar spine. The vertebral body heights are
well preserved. Multi level ventral endplate spurring identified
within the lumbar spine. No fractures. Aortic atherosclerosis.
IMPRESSION: 1. Mild degenerative disc disease.
2.  Aortic Atherosclerosis ([id number]-VAV.V).

.

## 2021-04-28 DIAGNOSIS — E1165 Type 2 diabetes mellitus with hyperglycemia: Secondary | ICD-10-CM | POA: Insufficient documentation

## 2021-04-28 DIAGNOSIS — N182 Chronic kidney disease, stage 2 (mild): Secondary | ICD-10-CM | POA: Insufficient documentation

## 2021-04-28 DIAGNOSIS — D509 Iron deficiency anemia, unspecified: Secondary | ICD-10-CM | POA: Insufficient documentation

## 2021-04-28 DIAGNOSIS — E782 Mixed hyperlipidemia: Secondary | ICD-10-CM | POA: Insufficient documentation

## 2021-04-29 DIAGNOSIS — N189 Chronic kidney disease, unspecified: Secondary | ICD-10-CM | POA: Diagnosis not present

## 2021-04-29 DIAGNOSIS — E1165 Type 2 diabetes mellitus with hyperglycemia: Secondary | ICD-10-CM | POA: Diagnosis not present

## 2021-04-29 DIAGNOSIS — E785 Hyperlipidemia, unspecified: Secondary | ICD-10-CM | POA: Diagnosis not present

## 2021-05-04 DIAGNOSIS — Z89511 Acquired absence of right leg below knee: Secondary | ICD-10-CM | POA: Diagnosis not present

## 2021-05-04 DIAGNOSIS — I10 Essential (primary) hypertension: Secondary | ICD-10-CM | POA: Diagnosis not present

## 2021-05-04 DIAGNOSIS — K219 Gastro-esophageal reflux disease without esophagitis: Secondary | ICD-10-CM | POA: Diagnosis not present

## 2021-05-04 DIAGNOSIS — Z0001 Encounter for general adult medical examination with abnormal findings: Secondary | ICD-10-CM | POA: Diagnosis not present

## 2021-05-04 DIAGNOSIS — E1165 Type 2 diabetes mellitus with hyperglycemia: Secondary | ICD-10-CM | POA: Diagnosis not present

## 2021-05-04 DIAGNOSIS — Z9189 Other specified personal risk factors, not elsewhere classified: Secondary | ICD-10-CM | POA: Diagnosis not present

## 2021-05-04 DIAGNOSIS — D649 Anemia, unspecified: Secondary | ICD-10-CM | POA: Diagnosis not present

## 2021-05-04 DIAGNOSIS — I2510 Atherosclerotic heart disease of native coronary artery without angina pectoris: Secondary | ICD-10-CM | POA: Diagnosis not present

## 2021-05-04 DIAGNOSIS — N182 Chronic kidney disease, stage 2 (mild): Secondary | ICD-10-CM | POA: Diagnosis not present

## 2021-05-04 DIAGNOSIS — I5032 Chronic diastolic (congestive) heart failure: Secondary | ICD-10-CM | POA: Diagnosis not present

## 2021-05-16 DIAGNOSIS — I10 Essential (primary) hypertension: Secondary | ICD-10-CM | POA: Diagnosis not present

## 2021-05-16 DIAGNOSIS — E7800 Pure hypercholesterolemia, unspecified: Secondary | ICD-10-CM | POA: Diagnosis not present

## 2021-06-03 ENCOUNTER — Ambulatory Visit: Payer: Medicare Other | Admitting: Urology

## 2021-06-17 ENCOUNTER — Other Ambulatory Visit: Payer: Self-pay

## 2021-06-17 ENCOUNTER — Ambulatory Visit (INDEPENDENT_AMBULATORY_CARE_PROVIDER_SITE_OTHER): Payer: Medicare Other | Admitting: Urology

## 2021-06-17 VITALS — BP 108/71 | HR 69

## 2021-06-17 DIAGNOSIS — R310 Gross hematuria: Secondary | ICD-10-CM | POA: Diagnosis not present

## 2021-06-17 DIAGNOSIS — K629 Disease of anus and rectum, unspecified: Secondary | ICD-10-CM

## 2021-06-17 DIAGNOSIS — N481 Balanitis: Secondary | ICD-10-CM

## 2021-06-17 DIAGNOSIS — N390 Urinary tract infection, site not specified: Secondary | ICD-10-CM | POA: Diagnosis not present

## 2021-06-17 LAB — URINALYSIS, ROUTINE W REFLEX MICROSCOPIC
Bilirubin, UA: NEGATIVE
Glucose, UA: NEGATIVE
Ketones, UA: NEGATIVE
Leukocytes,UA: NEGATIVE
Nitrite, UA: NEGATIVE
Protein,UA: NEGATIVE
RBC, UA: NEGATIVE
Specific Gravity, UA: 1.015 (ref 1.005–1.030)
Urobilinogen, Ur: 0.2 mg/dL (ref 0.2–1.0)
pH, UA: 6 (ref 5.0–7.5)

## 2021-06-17 MED ORDER — CLOTRIMAZOLE-BETAMETHASONE 1-0.05 % EX CREA: 1.0000 "application " | TOPICAL_CREAM | Freq: Two times a day (BID) | CUTANEOUS | 2 refills | Status: AC

## 2021-06-17 NOTE — Progress Notes (Signed)
Subjective: 1. Recurrent UTI   2. Gross hematuria   3. Balanitis   4. Anal lesion      Consult requested by Dr. Wende Neighbors.  Albert [name] is a former patient who is sent back by Dr. Nevada Crane for recurrent UTI's.  He has previously been seen for balanitis but that has resolved.   He has had UTI's about every 3-4 months since late 2021.  His UA today is clear.   He was last treated in June.  He will have dysuria and malodorous urine with frequency and urgency.   He has had hematuria with the first episode.  He has had no fever with the UTI's.  He has been given Macrobid.   He has no history of stones.   He has an IPP but has no problems with that.  His UA today is clear. His IPSS is 3.   He has started cranberry extract tabs about 5 months ago. He had a CT in 2015.  The IPP reservoir is anterior to the bladder.  ROS:  ROS  Allergies  Allergen Reactions   Amoxicillin Nausea And Vomiting    Has patient had a PCN reaction causing immediate rash, facial/tongue/throat swelling, SOB or lightheadedness with hypotension: No Has patient had a PCN reaction causing severe rash involving mucus membranes or skin necrosis: No Has patient had a PCN reaction that required hospitalization: No Has patient had a PCN reaction occurring within the last 10 years: Yes If all of the above answers are "NO", then may proceed with Cephalosporin use.     Past Medical History:  Diagnosis Date   Allergic rhinitis    Anemia    takes supplement   Chronic cough    Chronic sinusitis    Complication of anesthesia    couldn't swallow and talk   Coronary artery disease    CVA (cerebral infarction) 2011   R hand deficit   Diabetes mellitus without complication ([place]) [id number]   last HbA1c ~9 .... fasting 160s   GERD (gastroesophageal reflux disease)    Heart murmur    Hypercholesteremia    Hypertension    Myocardial infarction ([place]) 2014   Osteomyelitis ([place])    first and third metatarsal   Pneumonia    hx of walking    Splenic infarct    setting of lupus anticoagulant   Stroke [facility]) 2011   right arm weakness    Past Surgical History:  Procedure Laterality Date   4th Toe Amputation Left Jan. 2014   4th toe in [place]   AMPUTATION Left 12/08/2012   Procedure: AMPUTATION DIGIT;  Surgeon: Mcarthur Rossetti, MD;  Location: [place];  Service: Orthopedics;  Laterality: Left;  3rd toe amputation possible 5th toe amputation   AMPUTATION Left 12/11/2012   Procedure: Amputation of 2nd Toe;  Surgeon: Mcarthur Rossetti, MD;  Location: [place];  Service: Orthopedics;  Laterality: Left;   AMPUTATION Right 06/29/2013   Procedure: AMPUTATION BELOW KNEE;  Surgeon: Newt Minion, MD;  Location: [place];  Service: Orthopedics;  Laterality: Right;   AMPUTATION Left 08/29/2014   Procedure: Left Great Toe Amputation at MTP;  Surgeon: Newt Minion, MD;  Location: [place];  Service: Orthopedics;  Laterality: Left;   AMPUTATION Left 10/31/2014   Procedure: Midfoot Amputation;  Surgeon: Newt Minion, MD;  Location: [place];  Service: Orthopedics;  Laterality: Left;   BYPASS GRAFT POPLITEAL TO TIBIAL Left 12/13/2012   Procedure: BYPASS GRAFT POPLITEAL TO TIBIAL;  Surgeon:  Serafina Mitchell, MD;  Location: [facility] OR;  Service: Vascular;  Laterality: Left;  Left Popliteal to Posterior Tibial Bypass Graft with reversed saphenous vein graft   CARDIAC CATHETERIZATION     COLONOSCOPY [name] 04/14/2017   Procedure: COLONOSCOPY;  Surgeon: Danie Binder, MD;  [facility];  Service: Endoscopy;  Laterality: [name];  1000   I & D EXTREMITY Left 12/08/2012   Procedure: IRRIGATION AND DEBRIDEMENT EXTREMITY;  Surgeon: Mcarthur Rossetti, MD;  Location: [place];  Service: Orthopedics;  Laterality: Left;   I & D EXTREMITY Left 12/11/2012   Procedure: REPEAT I&D LEFT FOOT;  Surgeon: Mcarthur Rossetti, MD;  Location: [place];  Service: Orthopedics;  Laterality: Left;   INCISION AND DRAINAGE Right 06/25/2013   Procedure: INCISION AND DRAINAGE RIGHT  FOOT;  Surgeon: Mcarthur Rossetti, MD;  Location: [facility];  Service: Orthopedics;  Laterality: Right;   LEFT HEART CATHETERIZATION WITH CORONARY ANGIOGRAM [name] 06/28/2013   Procedure: LEFT HEART CATHETERIZATION WITH CORONARY ANGIOGRAM;  Surgeon: Burnell Blanks, MD;  Location: [facility] CATH LAB;  Service: Cardiovascular;  Laterality: [name];   MASS EXCISION [name] 11/15/2017   Procedure: EXCISION 4CM CYST ON NECK;  Surgeon: Virl Cagey, MD;  Location: [facility];  Service: General;  Laterality: [name];   PENILE PROSTHESIS IMPLANT     POLYPECTOMY  04/14/2017   Procedure: POLYPECTOMY;  Surgeon: Danie Binder, MD;  [facility];  Service: Endoscopy;;  colon   STUMP REVISION Right 08/16/2013   Procedure: STUMP REVISION- right Below Knee Amputation;  Surgeon: Newt Minion, MD;  Location: [place];  Service: Orthopedics;  Laterality: Right;   TEE WITHOUT CARDIOVERSION [name] 07/03/2013   Procedure: TRANSESOPHAGEAL ECHOCARDIOGRAM (TEE);  Surgeon: Larey Dresser, MD;  Location: [facility] ENDOSCOPY;  Service: Cardiovascular;  Laterality: [name];   TONSILLECTOMY AND ADENOIDECTOMY      Social History   Socioeconomic History   Marital status: Divorced    Spouse name: Not on file   Number of children: 2   Years of education: Not on file   Highest education level: Not on file  Occupational History   Occupation: Print production planner labor    Employer: [employer]    Comment: Disabled  Tobacco Use   Smoking status: Never   Smokeless tobacco: Never  Vaping Use   Vaping Use: Never used  Substance and Sexual Activity   Alcohol use: No    Alcohol/week: 0.0 standard drinks   Drug use: No   Sexual activity: Never  Other Topics Concern   Not on file  Social History Narrative   Lives in [place]   Wife: Engineer, structural   Social Determinants of Health   Financial Resource Strain: Not on file  Food Insecurity: Not on file  Transportation Needs: Not on file  Physical Activity: Not on file  Stress: Not on  file  Social Connections: Not on file  Intimate Partner Violence: Not on file    Family History  Problem Relation Age of Onset   Pancreatic cancer Mother    Diabetes Mother    Hyperlipidemia Mother    Diabetes Other    Breast cancer Sister    Depression Maternal Grandmother    Heart disease Maternal Grandmother    Depression Maternal Grandfather    Heart disease Maternal Grandfather    Depression Paternal Grandmother    Heart disease Paternal Grandmother    Depression Paternal Grandfather    Heart disease Paternal Grandfather     Anti-infectives:  Anti-infectives (From admission, onward)    None       Current Outpatient Medications  Medication Sig Dispense Refill   aspirin EC 81 MG tablet Take 1 tablet (81 mg total) by mouth daily. 30 tablet 1   atorvastatin (LIPITOR) 80 MG tablet Take 80 mg by mouth every other day.      carvedilol (COREG) 25 MG tablet Take 25 mg by mouth 2 (two) times daily.      furosemide (LASIX) 80 MG tablet Take 80 mg by mouth daily. May take additional 40 mg as needed for weight gain     HUMALOG KWIKPEN 100 UNIT/ML KwikPen SMARTSIG:20 Unit(s) SUB-Q 3 Times Daily     hydrALAZINE (APRESOLINE) 10 MG tablet Take 1 tablet (10 mg total) by mouth 3 (three) times daily. 120 tablet 1   Insulin Lispro (HUMALOG KWIKPEN ) Inject 25 Units into the skin in the morning, at noon, and at bedtime.     losartan (COZAAR) 50 MG tablet TAKE 1 TABLET BY MOUTH  DAILY 90 tablet 0   metFORMIN (GLUCOPHAGE-XR) 500 MG 24 hr tablet Take 1 tablet by mouth in the morning and at bedtime.      mupirocin ointment (BACTROBAN) 2 % Apply 1 application topically daily. (Patient taking differently: Apply 1 application topically once a week. Apply to left foot) 22 g 6   nitroGLYCERIN (NITRODUR - DOSED IN MG/24 HR) 0.2 mg/hr patch APPLY 1 PATCH ONTO SKIN  DAILY 90 patch 3   pantoprazole (PROTONIX) 40 MG tablet Take 1 tablet (40 mg total) by mouth daily. 30 tablet 1   pentoxifylline  (TRENTAL) 400 MG CR tablet Take 1 tablet (400 mg total) by mouth 3 (three) times daily with meals. 90 tablet 11   potassium chloride (K-DUR,KLOR-CON) 10 MEQ tablet Take 1 tablet (10 mEq total) by mouth daily. 30 tablet 6   potassium chloride (KLOR-CON) 10 MEQ tablet Take 10 mEq by mouth 2 (two) times daily.     TRESIBA FLEXTOUCH 100 UNIT/ML SOPN FlexTouch Pen Inject 57 Units into the skin daily.  6   TRUVADA 200-300 MG tablet Take 1 tablet by mouth daily.  5   clotrimazole-betamethasone (LOTRISONE) cream Apply 1 application topically 2 (two) times daily. 30 g 2   No current facility-administered medications for this visit.     Objective: Vital signs in last 24 hours: BP 108/71   Pulse 69   Intake/Output from previous day: No intake/output data recorded. Intake/Output this shift: '@IOTHISSHIFT'$ @   Physical Exam Vitals reviewed.  Constitutional:      Appearance: Normal appearance. He is obese.  Cardiovascular:     Rate and Rhythm: Normal rate and regular rhythm.     Heart sounds: Normal heart sounds.  Pulmonary:     Effort: Pulmonary effort is normal.     Breath sounds: Normal breath sounds.  Abdominal:     Palpations: Abdomen is soft.     Tenderness: There is no abdominal tenderness.     Hernia: No hernia is present.  Genitourinary:    Comments: Uncirc phallus with an  inflated IPP without evidence of infection.  Meatus with very mild acquired hypospadius. Scrotum normal.  AMS pump on the right. Testes and epdidymis normal.  AP without lesions. NST with rough thickened area posteriorly in the canal.  possible posterior fissure.  Prostate 2+ benign.  SV non-palpable.  Musculoskeletal:     Comments: Right BKA.   Skin:    General: Skin is warm and dry.  Neurological:  Mental Status: He is alert.     Comments: Right hemiparesis.   Psychiatric:        Mood and Affect: Mood normal.        Behavior: Behavior normal.    Lab Results:  Results for orders placed or  performed in visit on 06/17/21 (from the past 24 hour(s))  Urinalysis, Routine w reflex microscopic     Status: None   Collection Time: 06/17/21 11:07 AM  Result Value Ref Range   Specific Gravity, UA 1.015 1.005 - 1.030   pH, UA 6.0 5.0 - 7.5   Color, UA Yellow Yellow   Appearance Ur Clear Clear   Leukocytes,UA Negative Negative   Protein,UA Negative Negative/Trace   Glucose, UA Negative Negative   Ketones, UA Negative Negative   RBC, UA Negative Negative   Bilirubin, UA Negative Negative   Urobilinogen, Ur 0.2 0.2 - 1.0 mg/dL   Nitrite, UA Negative Negative   Microscopic Examination Comment    Narrative   Performed at:  [facility] [address] [place], [place]  [id number] Lab Director: [place], Phone:  [phone]    BMET No results for input(s): NA, K, CL, CO2, GLUCOSE, BUN, CREATININE, CALCIUM in the last 72 hours. PT/INR No results for input(s): LABPROT, INR in the last 72 hours. ABG No results for input(s): PHART, HCO3 in the last 72 hours.  Invalid input(s): PCO2, PO2  Studies/Results: No results found.   Assessment/Plan: Recurrent UTI's with some hematuria.   I am going to get a renal US and have him return in 3 months since he is clear today.  Hopefully we can get an accurate PVR with the [place], but his IPP reservoir is anterior to the bladder.  Balanitis.   I have refilled the clotrimazole/betamethazone cream.  ED.  He keeps his IPP inflated but I recommended he deflate it between uses.  Anal lesion.  I will have him see GI.    Meds ordered this encounter  Medications   clotrimazole-betamethasone (LOTRISONE) cream    Sig: Apply 1 application topically 2 (two) times daily.    Dispense:  30 g    Refill:  2     Orders Placed This Encounter  Procedures   US RENAL    Please check pre and post void bladder volumes.  He has a penile prosthesis anterior to the bladder which could impact the ability to image the bladder.    Standing  Status:   Future    Standing Expiration Date:   06/17/2022    Order Specific Question:   Reason for Exam (SYMPTOM  OR DIAGNOSIS REQUIRED)    Answer:   hematuria    Order Specific Question:   Preferred imaging location?    Answer:   [facility]   Urinalysis, Routine w reflex microscopic   Ambulatory referral to Gastroenterology    Referral Priority:   Routine    Referral Type:   Consultation    Referral Reason:   Specialty Services Required    Number of Visits Requested:   1     Return in about 3 months (around 09/16/2021).    CC: Dr. Wende Neighbors.      Albert [name] 06/17/2021 [phone]

## 2021-06-17 NOTE — Progress Notes (Signed)
Urological Symptom Review  Patient is experiencing the following symptoms: Frequent urination Urinary tract infection Erection problems (male only)   Review of Systems  Gastrointestinal (upper)  : Negative for upper GI symptoms  Gastrointestinal (lower) : Negative for lower GI symptoms  Constitutional : Negative for symptoms  Skin: Negative for skin symptoms  Eyes: Negative for eye symptoms  Ear/Nose/Throat : Negative for Ear/Nose/Throat symptoms  Hematologic/Lymphatic: Negative for Hematologic/Lymphatic symptoms  Cardiovascular : Negative for cardiovascular symptoms  Respiratory : Negative for respiratory symptoms  Endocrine: Negative for endocrine symptoms  Musculoskeletal: Negative for musculoskeletal symptoms  Neurological: Negative for neurological symptoms  Psychologic: Negative for psychiatric symptoms

## 2021-08-05 DIAGNOSIS — E1165 Type 2 diabetes mellitus with hyperglycemia: Secondary | ICD-10-CM | POA: Diagnosis not present

## 2021-08-05 DIAGNOSIS — E782 Mixed hyperlipidemia: Secondary | ICD-10-CM | POA: Diagnosis not present

## 2021-08-11 DIAGNOSIS — I10 Essential (primary) hypertension: Secondary | ICD-10-CM | POA: Diagnosis not present

## 2021-08-11 DIAGNOSIS — I5032 Chronic diastolic (congestive) heart failure: Secondary | ICD-10-CM | POA: Diagnosis not present

## 2021-08-11 DIAGNOSIS — Z89511 Acquired absence of right leg below knee: Secondary | ICD-10-CM | POA: Diagnosis not present

## 2021-08-11 DIAGNOSIS — E1165 Type 2 diabetes mellitus with hyperglycemia: Secondary | ICD-10-CM | POA: Diagnosis not present

## 2021-08-11 DIAGNOSIS — Z23 Encounter for immunization: Secondary | ICD-10-CM | POA: Diagnosis not present

## 2021-08-11 DIAGNOSIS — K219 Gastro-esophageal reflux disease without esophagitis: Secondary | ICD-10-CM | POA: Diagnosis not present

## 2021-08-11 DIAGNOSIS — I2510 Atherosclerotic heart disease of native coronary artery without angina pectoris: Secondary | ICD-10-CM | POA: Diagnosis not present

## 2021-08-11 DIAGNOSIS — Z9189 Other specified personal risk factors, not elsewhere classified: Secondary | ICD-10-CM | POA: Diagnosis not present

## 2021-08-11 DIAGNOSIS — D649 Anemia, unspecified: Secondary | ICD-10-CM | POA: Diagnosis not present

## 2021-08-11 DIAGNOSIS — N182 Chronic kidney disease, stage 2 (mild): Secondary | ICD-10-CM | POA: Diagnosis not present

## 2021-08-16 ENCOUNTER — Ambulatory Visit: Payer: Medicare Other | Admitting: Cardiology

## 2021-08-16 DIAGNOSIS — E7800 Pure hypercholesterolemia, unspecified: Secondary | ICD-10-CM | POA: Diagnosis not present

## 2021-08-16 DIAGNOSIS — I10 Essential (primary) hypertension: Secondary | ICD-10-CM | POA: Diagnosis not present

## 2021-08-18 ENCOUNTER — Ambulatory Visit (INDEPENDENT_AMBULATORY_CARE_PROVIDER_SITE_OTHER): Payer: Medicare Other | Admitting: Cardiology

## 2021-08-18 ENCOUNTER — Encounter: Payer: Self-pay | Admitting: Cardiology

## 2021-08-18 VITALS — BP 138/90 | HR 68 | Ht 71.0 in | Wt 264.6 lb

## 2021-08-18 DIAGNOSIS — I10 Essential (primary) hypertension: Secondary | ICD-10-CM

## 2021-08-18 DIAGNOSIS — E782 Mixed hyperlipidemia: Secondary | ICD-10-CM | POA: Diagnosis not present

## 2021-08-18 DIAGNOSIS — I2510 Atherosclerotic heart disease of native coronary artery without angina pectoris: Secondary | ICD-10-CM | POA: Diagnosis not present

## 2021-08-18 DIAGNOSIS — I5022 Chronic systolic (congestive) heart failure: Secondary | ICD-10-CM | POA: Diagnosis not present

## 2021-08-18 NOTE — Patient Instructions (Signed)

## 2021-08-18 NOTE — Progress Notes (Signed)
Clinical Summary [name] is a 60 y.o.maleseen today for follow up of the following medical problems.    1. Chronic combined systolic/diastolic heart failure - ICM, LVEF 40-45% by echo 12/2013, grade II diastolic dysfunction - 0/8144 LVEF 81%, grade I diastolic dysfunction    - no recent SOB/DOE - no recent edema. Home weights 257-263 - compliant with meds   2. CAD - hx of demand ischemia 06/2013 in setting of sepsis with necrotic foot wound. - cath at that time 06/2013 LM patent, LAD 40% prox, 70% mid follow by 60% lesion, distal 90%. Diag 99%. LCX 80-90% prox, OM1 80%, OM2 99%, OM3 99 % (all OMs are small), RCA small non-dom mid 99%. LVEF 40% by LVgram.   - CAD was managed medically after discussions with CT surgery at that time, with plans to reevaluate once recurring infection issues resolved.     - denies any chest pains.    4. PAD - followed by vascular and [facility] - previous right BKA, previous multiple toe amputations. 08/30/14 treated for osteo and ulcer of left great toe with amputation. Jan 2016 had left midfoot amputation.   - Followed by ortho Dr Sharol Given.        5. Hyperlipidemia   - 06/2020 TC 106 TG 106 HDL 29 LDL 57 -01/2021 TC 101 TG 78 HDL 26 LDL 59 04/2021 TC 99 TG 84 HDL 26 LDL 56 - he is on atorvastatin 80mg  daily   6. HTN - remains compliant with meds   7. Carotid stenosis  07/2020 mild bilateral disease   SH:  [place] with recent severe case of viral meningitis, still recovering Brother in Sports coach recently passed from stroke.    Past Medical History:  Diagnosis Date   Allergic rhinitis    Anemia    takes supplement   Chronic cough    Chronic sinusitis    Complication of anesthesia    couldn't swallow and talk   Coronary artery disease    CVA (cerebral infarction) 2011   R hand deficit   Diabetes mellitus without complication ([place]) ~8563   last HbA1c ~9 .... fasting 160s   GERD (gastroesophageal reflux disease)    Heart murmur     Hypercholesteremia    Hypertension    Myocardial infarction ([place]) 2014   Osteomyelitis ([place])    first and third metatarsal   Pneumonia    hx of walking   Splenic infarct    setting of lupus anticoagulant   Stroke [facility]) 2011   right arm weakness     Allergies  Allergen Reactions   Amoxicillin Nausea And Vomiting    Has patient had a PCN reaction causing immediate rash, facial/tongue/throat swelling, SOB or lightheadedness with hypotension: No Has patient had a PCN reaction causing severe rash involving mucus membranes or skin necrosis: No Has patient had a PCN reaction that required hospitalization: No Has patient had a PCN reaction occurring within the last 10 years: Yes If all of the above answers are "NO", then may proceed with Cephalosporin use.      Current Outpatient Medications  Medication Sig Dispense Refill   aspirin EC 81 MG tablet Take 1 tablet (81 mg total) by mouth daily. 30 tablet 1   atorvastatin (LIPITOR) 80 MG tablet Take 80 mg by mouth every other day.      carvedilol (COREG) 25 MG tablet Take 25 mg by mouth 2 (two) times daily.      clotrimazole-betamethasone (LOTRISONE) cream Apply  1 application topically 2 (two) times daily. 30 g 2   furosemide (LASIX) 80 MG tablet Take 80 mg by mouth daily. May take additional 40 mg as needed for weight gain     HUMALOG KWIKPEN 100 UNIT/ML KwikPen SMARTSIG:20 Unit(s) SUB-Q 3 Times Daily     hydrALAZINE (APRESOLINE) 10 MG tablet Take 1 tablet (10 mg total) by mouth 3 (three) times daily. 120 tablet 1   Insulin Lispro (HUMALOG KWIKPEN [place]) Inject 25 Units into the skin in the morning, at noon, and at bedtime.     losartan (COZAAR) 50 MG tablet TAKE 1 TABLET BY MOUTH  DAILY 90 tablet 0   metFORMIN (GLUCOPHAGE-XR) 500 MG 24 hr tablet Take 1 tablet by mouth in the morning and at bedtime.      mupirocin ointment (BACTROBAN) 2 % Apply 1 application topically daily. (Patient taking differently: Apply 1 application topically once a  week. Apply to left foot) 22 g 6   nitroGLYCERIN (NITRODUR - DOSED IN MG/24 HR) 0.2 mg/hr patch APPLY 1 PATCH ONTO SKIN  DAILY 90 patch 3   pantoprazole (PROTONIX) 40 MG tablet Take 1 tablet (40 mg total) by mouth daily. 30 tablet 1   pentoxifylline (TRENTAL) 400 MG CR tablet Take 1 tablet (400 mg total) by mouth 3 (three) times daily with meals. 90 tablet 11   potassium chloride (K-DUR,KLOR-CON) 10 MEQ tablet Take 1 tablet (10 mEq total) by mouth daily. 30 tablet 6   potassium chloride (KLOR-CON) 10 MEQ tablet Take 10 mEq by mouth 2 (two) times daily.     TRESIBA FLEXTOUCH 100 UNIT/ML SOPN FlexTouch Pen Inject 57 Units into the skin daily.  6   TRUVADA 200-300 MG tablet Take 1 tablet by mouth daily.  5   No current facility-administered medications for this visit.     Past Surgical History:  Procedure Laterality Date   4th Toe Amputation Left Jan. 2014   4th toe in [place]   AMPUTATION Left 12/08/2012   Procedure: AMPUTATION DIGIT;  Surgeon: Mcarthur Rossetti, MD;  Location: [place];  Service: Orthopedics;  Laterality: Left;  3rd toe amputation possible 5th toe amputation   AMPUTATION Left 12/11/2012   Procedure: Amputation of 2nd Toe;  Surgeon: Mcarthur Rossetti, MD;  Location: [place];  Service: Orthopedics;  Laterality: Left;   AMPUTATION Right 06/29/2013   Procedure: AMPUTATION BELOW KNEE;  Surgeon: Newt Minion, MD;  Location: [place];  Service: Orthopedics;  Laterality: Right;   AMPUTATION Left 08/29/2014   Procedure: Left Great Toe Amputation at MTP;  Surgeon: Newt Minion, MD;  Location: [place];  Service: Orthopedics;  Laterality: Left;   AMPUTATION Left 10/31/2014   Procedure: Midfoot Amputation;  Surgeon: Newt Minion, MD;  Location: [place];  Service: Orthopedics;  Laterality: Left;   BYPASS GRAFT POPLITEAL TO TIBIAL Left 12/13/2012   Procedure: BYPASS GRAFT POPLITEAL TO TIBIAL;  Surgeon: Serafina Mitchell, MD;  Location: [place];  Service: Vascular;  Laterality: Left;  Left Popliteal  to Posterior Tibial Bypass Graft with reversed saphenous vein graft   CARDIAC CATHETERIZATION     COLONOSCOPY [name] 04/14/2017   Procedure: COLONOSCOPY;  Surgeon: Danie Binder, MD;  [facility];  Service: Endoscopy;  Laterality: [name];  1000   I & D EXTREMITY Left 12/08/2012   Procedure: IRRIGATION AND DEBRIDEMENT EXTREMITY;  Surgeon: Mcarthur Rossetti, MD;  Location: [place];  Service: Orthopedics;  Laterality: Left;   I & D EXTREMITY Left  12/11/2012   Procedure: REPEAT I&D LEFT FOOT;  Surgeon: Mcarthur Rossetti, MD;  Location: [place];  Service: Orthopedics;  Laterality: Left;   INCISION AND DRAINAGE Right 06/25/2013   Procedure: INCISION AND DRAINAGE RIGHT FOOT;  Surgeon: Mcarthur Rossetti, MD;  Location: [facility];  Service: Orthopedics;  Laterality: Right;   LEFT HEART CATHETERIZATION WITH CORONARY ANGIOGRAM [name] 06/28/2013   Procedure: LEFT HEART CATHETERIZATION WITH CORONARY ANGIOGRAM;  Surgeon: Burnell Blanks, MD;  Location: [facility] C[facility] LAB;  Service: Cardiovascular;  Laterality: [name];   MASS EXCISION [name] 11/15/2017   Procedure: EXCISION 4CM CYST ON NECK;  Surgeon: Virl Cagey, MD;  Location: [facility];  Service: General;  Laterality: [name];   PENILE PROSTHESIS IMPLANT     POLYPECTOMY  04/14/2017   Procedure: POLYPECTOMY;  Surgeon: Danie Binder, MD;  [facility];  Service: Endoscopy;;  colon   STUMP REVISION Right 08/16/2013   Procedure: STUMP REVISION- right Below Knee Amputation;  Surgeon: Newt Minion, MD;  Location: [place];  Service: Orthopedics;  Laterality: Right;   TEE WITHOUT CARDIOVERSION [name] 07/03/2013   Procedure: TRANSESOPHAGEAL ECHOCARDIOGRAM (TEE);  Surgeon: Larey Dresser, MD;  [facility] END[facility]COPY;  Service: Cardiovascular;  Laterality: [name];   TONSILLECTOMY AND ADENOIDECTOMY       Allergies  Allergen Reactions   Amoxicillin Nausea And Vomiting    Has patient had a PCN reaction causing immediate rash, facial/tongue/throat swelling,  SOB or lightheadedness with hypotension: No Has patient had a PCN reaction causing severe rash involving mucus membranes or skin necrosis: No Has patient had a PCN reaction that required hospitalization: No Has patient had a PCN reaction occurring within the last 10 years: Yes If all of the above answers are "NO", then may proceed with Cephalosporin use.       Family History  Problem Relation Age of Onset   Pancreatic cancer Mother    Diabetes Mother    Hyperlipidemia Mother    Diabetes Other    Breast cancer Sister    Depression Maternal Grandmother    Heart disease Maternal Grandmother    Depression Maternal Grandfather    Heart disease Maternal Grandfather    Depression Paternal Grandmother    Heart disease Paternal Grandmother    Depression Paternal Grandfather    Heart disease Paternal Grandfather      Social History Mr. Schinke reports that he has never smoked. He has never used smokeless tobacco. Mr. Lubitz reports no history of alcohol use.   Review of Systems CONSTITUTIONAL: No weight loss, fever, chills, weakness or fatigue.  HEENT: Eyes: No visual loss, blurred vision, double vision or yellow sclerae.No hearing loss, sneezing, congestion, runny nose or sore throat.  SKIN: No rash or itching.  CARDIOVASCULAR: per hpi RESPIRATORY: No shortness of breath, cough or sputum.  GASTROINTESTINAL: No anorexia, nausea, vomiting or diarrhea. No abdominal pain or blood.  GENITOURINARY: No burning on urination, no polyuria NEUROLOGICAL: No headache, dizziness, syncope, paralysis, ataxia, numbness or tingling in the extremities. No change in bowel or bladder control.  MUSCULOSKELETAL: No muscle, back pain, joint pain or stiffness.  LYMPHATICS: No enlarged nodes. No history of splenectomy.  PSYCHIATRIC: No history of depression or anxiety.  ENDOCRINOLOGIC: No reports of sweating, cold or heat intolerance. No polyuria or polydipsia.  Marland Kitchen   Physical Examination Today's Vitals    08/18/21 0930  BP: 138/90  Pulse: 68  SpO2: 98%  Weight: 264 lb 9.6 oz (120 kg)  Height: 5\' 11"  (1.803  m)   Body mass index is 36.9 kg/m.  Gen: resting comfortably, no acute distress HEENT: no scleral icterus, pupils equal round and reactive, no palptable cervical adenopathy,  CV: RRR, no m/r/g no jvd Resp: Clear to auscultation bilaterally GI: abdomen is soft, non-tender, non-distended, normal bowel sounds, no hepatosplenomegaly MSK: extremities are warm, no edema.  Skin: warm, no rash Neuro:  no focal deficits Psych: appropriate affect   Diagnostic Studies  06/2013 Cath Hemodynamic Findings: Central aortic pressure: 90/61 Left ventricular pressure: 90/19/29   Angiographic Findings:   Left main: Short segment without obstruction.     Left Anterior Descending Artery: Moderate caliber diffusely diseased vessel that courses to the apex. The proximal vessel has diffuse 40% stenosis. The mid vessel has a 70% stenosis followed by a 60% stenosis. The distal vessel has diffuse 90% stenosis. The diagonal is a moderate caliber vessel with ostial 99% stenosis with 99% disease extending into the mid vessel.     Circumflex Artery: Large caliber vessel with proximal 80-90% stenosis. There are three small to moderate caliber obtuse marginal branches. The first OM has mid 80% stenosis. The second OM has a proximal 99% stenosis. The third OM has diffuse 99% stenosis. The left sided posterolateral Stepanie Graver has 99% stenosis.     Right Coronary Artery: Small non-dominant vessel with mid 99% stenosis.    Left Ventricular Angiogram: LVEF=40%   Impression: 1. Triple vessel CAD 2. NSTEMI secondary to demand ischemia from sepsis with severe underlying CAD 3. Mild LV systolic dysfunction   Recommendations: Complex situation. The only option for coronary revascularization is CABG. He is not a favorable candidate for CABG at this time with necrotic right foot, sepsis. Even though not ideal, would  favor medical management of CAD while undergoing treatment of his necrotic foot. Would proceed with right BKA this weekend. I have discussed the case with CT surgery who agrees that CABG is not a good option at this time. Will ask CT surgery to see after he recovers from his BKA. Continue ASA, beta blocker, statin.          Complications:  None. The patient tolerated the procedure well.                11/2012 Carotid US Summary:  - No significant extracranial carotid artery stenosis   demonstrated. Vertebrals are patent with antegrade flow. - ICA/CCA ratio= right = 0.94. left = 0.73. Other specific details can be found in the table(s) above.    Prepared and Electronically Authenticated by   03/2015 Carotid US IMPRESSION: Mild atherosclerotic disease in the carotid arteries, right side greater the left. The peak systolic velocity in the proximal right internal carotid artery is slightly elevated. Estimated degree of stenosis in the right internal carotid artery is close to 50%.   Estimated degree of stenosis in the left internal carotid artery is less than 50%.   Patent vertebral arteries.     12/07/15 Clinic EKG (performed and reviewed in clinic): NSR   01/2017 echo   Study Conclusions   - Left ventricle: The cavity size was normal. Wall thickness was at   the upper limits of normal. The estimated ejection fraction was   50%. Doppler parameters are consistent with abnormal left   ventricular relaxation (grade 1 diastolic dysfunction). - Aortic valve: Mildly calcified annulus. Trileaflet. - Mitral valve: Calcified annulus. Mildly thickened leaflets .   There was trivial regurgitation. - Tricuspid valve: There was trivial regurgitation. Peak RV-RA   gradient (S):  21 mm Hg. - Pulmonary arteries: Systolic pressure could not be accurately   estimated. - Pericardium, extracardiac: A trivial pericardial effusion was   identified.   Impressions:   - Overall limited images. Upper  normal LV wall thickness with LVEF   approximately 50% based on best views. Cannot exclude periapical   wall motion abnormalities. Would suggest a limited study with   Definity contrast. Calcified mitral annulus with mildly thickened   leaflets and trivial mitral regurgitation. Trivial tricuspid   regurgitation with RV-RA gradient 21 mmHg. Trivial pericardial   effusion.   Assessment and Plan  1. Chronic systolic heart failure - LVEF has normalized based on most recent echo - doing well without symptoms, cotinue current meds   2. CAD -no recent symptoms, continue current meds     3. Hyperlipidemia -LDL is at goal, continue atorvastatin.      4. HTN -bp elevated today, at recent urology appt was at goal - continue to monitor at this time.       Arnoldo Lenis, M.D.

## 2021-09-13 ENCOUNTER — Ambulatory Visit (HOSPITAL_COMMUNITY)
Admission: RE | Admit: 2021-09-13 | Discharge: 2021-09-13 | Disposition: A | Discharge status: Home / Self Care | Payer: Medicare Other | Source: Ambulatory Visit | Attending: Urology | Admitting: Urology

## 2021-09-13 ENCOUNTER — Other Ambulatory Visit: Payer: Self-pay

## 2021-09-13 DIAGNOSIS — R310 Gross hematuria: Secondary | ICD-10-CM | POA: Insufficient documentation

## 2021-09-13 DIAGNOSIS — N390 Urinary tract infection, site not specified: Secondary | ICD-10-CM | POA: Insufficient documentation

## 2021-09-13 DIAGNOSIS — N3289 Other specified disorders of bladder: Secondary | ICD-10-CM | POA: Diagnosis not present

## 2021-09-15 DIAGNOSIS — E7800 Pure hypercholesterolemia, unspecified: Secondary | ICD-10-CM | POA: Diagnosis not present

## 2021-09-15 DIAGNOSIS — I10 Essential (primary) hypertension: Secondary | ICD-10-CM | POA: Diagnosis not present

## 2021-09-16 ENCOUNTER — Encounter: Payer: Self-pay | Admitting: Urology

## 2021-09-16 ENCOUNTER — Other Ambulatory Visit: Payer: Self-pay

## 2021-09-16 ENCOUNTER — Other Ambulatory Visit: Payer: Self-pay | Admitting: Orthopaedic Surgery

## 2021-09-16 ENCOUNTER — Ambulatory Visit (INDEPENDENT_AMBULATORY_CARE_PROVIDER_SITE_OTHER): Payer: Medicare Other | Admitting: Urology

## 2021-09-16 VITALS — BP 113/72 | HR 84

## 2021-09-16 DIAGNOSIS — N390 Urinary tract infection, site not specified: Secondary | ICD-10-CM

## 2021-09-16 DIAGNOSIS — R339 Retention of urine, unspecified: Secondary | ICD-10-CM | POA: Diagnosis not present

## 2021-09-16 LAB — URINALYSIS, ROUTINE W REFLEX MICROSCOPIC
Bilirubin, UA: NEGATIVE
Glucose, UA: NEGATIVE
Ketones, UA: NEGATIVE
Nitrite, UA: NEGATIVE
Protein,UA: NEGATIVE
RBC, UA: NEGATIVE
Specific Gravity, UA: 1.015 (ref 1.005–1.030)
Urobilinogen, Ur: 0.2 mg/dL (ref 0.2–1.0)
pH, UA: 5.5 (ref 5.0–7.5)

## 2021-09-16 LAB — MICROSCOPIC EXAMINATION
Bacteria, UA: NONE SEEN
RBC, Urine: NONE SEEN /hpf (ref 0–2)
Renal Epithel, UA: NONE SEEN /hpf

## 2021-09-16 NOTE — Progress Notes (Signed)
Subjective: 1. Recurrent UTI   2. Incomplete bladder emptying      Consult requested by Dr. Wende Neighbors.  Albert [name] is a former patient who is sent back by Dr. [place] Crane for recurrent UTI's.  He has previously been seen for balanitis but that has resolved.   He has had UTI's about every 3-4 months since late 2021.  His UA today is clear.   He was last treated in June.  He will have dysuria and malodorous urine with frequency and urgency.   He has had hematuria with the first episode.  He has had no fever with the UTI's.  He has been given Macrobid.   He has no history of stones.   He has an IPP but has no problems with that.  His UA today is clear. His IPSS is 3.   He has started cranberry extract tabs about 5 months ago. He had a CT in 2015.  The IPP reservoir is anterior to the bladder.   09/16/21: [name] returns today in f/u for his history of recurrent UTI's.  He has been doing well without recurrence since July.  His UA today has only 6-10 WBC's.  He has no dysuria, hematuria or pain.  He had a renal US prior to this visit and had normal kidneys but a small amount of layered debris in the bladder.  His PVR is 168ml. He remains on Cranberry Extract tabs.  His IPSS is stable at 4.  ROS:  ROS  Allergies  Allergen Reactions   Amoxicillin Nausea And Vomiting    Has patient had a PCN reaction causing immediate rash, facial/tongue/throat swelling, SOB or lightheadedness with hypotension: No Has patient had a PCN reaction causing severe rash involving mucus membranes or skin necrosis: No Has patient had a PCN reaction that required hospitalization: No Has patient had a PCN reaction occurring within the last 10 years: Yes If all of the above answers are "NO", then may proceed with Cephalosporin use.     Past Medical History:  Diagnosis Date   Allergic rhinitis    Anemia    takes supplement   Chronic cough    Chronic sinusitis    Complication of anesthesia    couldn't swallow and talk    Coronary artery disease    CVA (cerebral infarction) 2011   R hand deficit   Diabetes mellitus without complication ([place]) ~2409   last HbA1c ~9 .... fasting 160s   GERD (gastroesophageal reflux disease)    Heart murmur    Hypercholesteremia    Hypertension    Myocardial infarction ([place]) 2014   Osteomyelitis ([place])    first and third metatarsal   Pneumonia    hx of walking   Splenic infarct    setting of lupus anticoagulant   Stroke [facility]) 2011   right arm weakness    Past Surgical History:  Procedure Laterality Date   4th Toe Amputation Left Jan. 2014   4th toe in [place]   AMPUTATION Left 12/08/2012   Procedure: AMPUTATION DIGIT;  Surgeon: Mcarthur Rossetti, MD;  Location: [place];  Service: Orthopedics;  Laterality: Left;  3rd toe amputation possible 5th toe amputation   AMPUTATION Left 12/11/2012   Procedure: Amputation of 2nd Toe;  Surgeon: Mcarthur Rossetti, MD;  Location: [place];  Service: Orthopedics;  Laterality: Left;   AMPUTATION Right 06/29/2013   Procedure: AMPUTATION BELOW KNEE;  Surgeon: Newt Minion, MD;  Location: [place];  Service: Orthopedics;  Laterality: Right;  AMPUTATION Left 08/29/2014   Procedure: Left Great Toe Amputation at MTP;  Surgeon: Newt Minion, MD;  Location: [place];  Service: Orthopedics;  Laterality: Left;   AMPUTATION Left 10/31/2014   Procedure: Midfoot Amputation;  Surgeon: Newt Minion, MD;  Location: [place];  Service: Orthopedics;  Laterality: Left;   BYPASS GRAFT POPLITEAL TO TIBIAL Left 12/13/2012   Procedure: BYPASS GRAFT POPLITEAL TO TIBIAL;  Surgeon: Serafina Mitchell, MD;  Location: [place];  Service: Vascular;  Laterality: Left;  Left Popliteal to Posterior Tibial Bypass Graft with reversed saphenous vein graft   CARDIAC CATHETERIZATION     COLONOSCOPY [name] 04/14/2017   Procedure: COLONOSCOPY;  Surgeon: Danie Binder, MD;  [facility];  Service: Endoscopy;  Laterality: [name];  1000   I & D EXTREMITY Left 12/08/2012   Procedure:  IRRIGATION AND DEBRIDEMENT EXTREMITY;  Surgeon: Mcarthur Rossetti, MD;  Location: [place];  Service: Orthopedics;  Laterality: Left;   I & D EXTREMITY Left 12/11/2012   Procedure: REPEAT I&D LEFT FOOT;  Surgeon: Mcarthur Rossetti, MD;  Location: [place];  Service: Orthopedics;  Laterality: Left;   INCISION AND DRAINAGE Right 06/25/2013   Procedure: INCISION AND DRAINAGE RIGHT FOOT;  Surgeon: Mcarthur Rossetti, MD;  [facility];  Service: Orthopedics;  Laterality: Right;   LEFT HEART CATHETERIZATION WITH CORONARY ANGIOGRAM [name] 06/28/2013   Procedure: LEFT HEART CATHETERIZATION WITH CORONARY ANGIOGRAM;  Surgeon: Burnell Blanks, MD;  Location: [facility] CATH LAB;  Service: Cardiovascular;  Laterality: [name];   MASS EXCISION [name] 11/15/2017   Procedure: EXCISION 4CM CYST ON NECK;  Surgeon: Virl Cagey, MD;  [facility];  Service: General;  Laterality: [name];   PENILE PROSTHESIS IMPLANT     POLYPECTOMY  04/14/2017   Procedure: POLYPECTOMY;  Surgeon: Danie Binder, MD;  [facility];  Service: Endoscopy;;  colon   STUMP REVISION Right 08/16/2013   Procedure: STUMP REVISION- right Below Knee Amputation;  Surgeon: Newt Minion, MD;  Location: [place];  Service: Orthopedics;  Laterality: Right;   TEE WITHOUT CARDIOVERSION [name] 07/03/2013   Procedure: TRANSESOPHAGEAL ECHOCARDIOGRAM (TEE);  Surgeon: Larey Dresser, MD;  Location: [facility] ENDOSCOPY;  Service: Cardiovascular;  Laterality: [name];   TONSILLECTOMY AND ADENOIDECTOMY      Social History   Socioeconomic History   Marital status: Divorced    Spouse name: Not on file   Number of children: 2   Years of education: Not on file   Highest education level: Not on file  Occupational History   Occupation: Print production planner labor    Employer: [employer]    Comment: Disabled  Tobacco Use   Smoking status: Never   Smokeless tobacco: Never  Vaping Use   Vaping Use: Never used  Substance and Sexual Activity    Alcohol use: No    Alcohol/week: 0.0 standard drinks   Drug use: No   Sexual activity: Never  Other Topics Concern   Not on file  Social History Narrative   Lives in [place]   Wife: Engineer, structural   Social Determinants of Health   Financial Resource Strain: Not on file  Food Insecurity: Not on file  Transportation Needs: Not on file  Physical Activity: Not on file  Stress: Not on file  Social Connections: Not on file  Intimate Partner Violence: Not on file    Family History  Problem Relation Age of Onset   Pancreatic cancer Mother    Diabetes Mother  Hyperlipidemia Mother    Diabetes Other    Breast cancer Sister    Depression Maternal Grandmother    Heart disease Maternal Grandmother    Depression Maternal Grandfather    Heart disease Maternal Grandfather    Depression Paternal Grandmother    Heart disease Paternal Grandmother    Depression Paternal Grandfather    Heart disease Paternal Grandfather     Anti-infectives: Anti-infectives (From admission, onward)    None       Current Outpatient Medications  Medication Sig Dispense Refill   aspirin EC 81 MG tablet Take 1 tablet (81 mg total) by mouth daily. 30 tablet 1   atorvastatin (LIPITOR) 80 MG tablet Take 80 mg by mouth every other day.      carvedilol (COREG) 25 MG tablet Take 25 mg by mouth 2 (two) times daily.      clotrimazole-betamethasone (LOTRISONE) cream Apply 1 application topically 2 (two) times daily. 30 g 2   furosemide (LASIX) 80 MG tablet Take 80 mg by mouth daily. May take additional 40 mg as needed for weight gain     hydrALAZINE (APRESOLINE) 10 MG tablet Take 1 tablet (10 mg total) by mouth 3 (three) times daily. (Patient taking differently: Take 10 mg by mouth 2 (two) times daily.) 120 tablet 1   Insulin Lispro (HUMALOG KWIKPEN [place]) Inject 25 Units into the skin in the morning, at noon, and at bedtime.     losartan (COZAAR) 50 MG tablet TAKE 1 TABLET BY MOUTH  DAILY 90 tablet 0   metFORMIN  (GLUCOPHAGE-XR) 500 MG 24 hr tablet Take 1 tablet by mouth in the morning and at bedtime.      mupirocin ointment (BACTROBAN) 2 % Apply 1 application topically daily. (Patient taking differently: Apply 1 application topically once a week. Apply to left foot) 22 g 6   nitroGLYCERIN (NITRODUR - DOSED IN MG/24 HR) 0.2 mg/hr patch APPLY 1 PATCH ONTO SKIN  DAILY 90 patch 3   pantoprazole (PROTONIX) 40 MG tablet Take 1 tablet (40 mg total) by mouth daily. (Patient taking differently: Take 40 mg by mouth every other day.) 30 tablet 1   pentoxifylline (TRENTAL) 400 MG CR tablet Take 1 tablet (400 mg total) by mouth 3 (three) times daily with meals. (Patient taking differently: Take 400 mg by mouth 2 (two) times daily with a meal.) 90 tablet 11   potassium chloride (K-DUR,KLOR-CON) 10 MEQ tablet Take 1 tablet (10 mEq total) by mouth daily. 30 tablet 6   TRESIBA FLEXTOUCH 100 UNIT/ML SOPN FlexTouch Pen Inject 57 Units into the skin daily.  6   No current facility-administered medications for this visit.     Objective: Vital signs in last 24 hours: BP 113/72   Pulse 84   Intake/Output from previous day: No intake/output data recorded. Intake/Output this shift: @IOTHISSHIFT @   Physical Exam  Lab Results:  Results for orders placed or performed in visit on 09/16/21 (from the past 24 hour(s))  Urinalysis, Routine w reflex microscopic     Status: Abnormal   Collection Time: 09/16/21 10:23 AM  Result Value Ref Range   Specific Gravity, UA 1.015 1.005 - 1.030   pH, UA 5.5 5.0 - 7.5   Color, UA Yellow Yellow   Appearance Ur Clear Clear   Leukocytes,UA 1+ (A) Negative   Protein,UA Negative Negative/Trace   Glucose, UA Negative Negative   Ketones, UA Negative Negative   RBC, UA Negative Negative   Bilirubin, UA Negative Negative   Urobilinogen,  Ur 0.2 0.2 - 1.0 mg/dL   Nitrite, UA Negative Negative   Microscopic Examination See below:    Narrative   Performed at:  [facility] [address] [place], [place]  [id number] Lab Director: Mina Marble MT, Phone:  [phone]  Microscopic Examination     Status: Abnormal   Collection Time: 09/16/21 10:23 AM   Urine  Result Value Ref Range   WBC, UA 6-10 (A) 0 - 5 /hpf   RBC None seen 0 - 2 /hpf   Epithelial Cells (non renal) 0-10 0 - 10 /hpf   Renal Epithel, UA None seen None seen /hpf   Bacteria, UA None seen None seen/Few   Narrative   Performed at:  [facility] [address], [place], [place]  [id number] Lab Director: [place], Phone:  [phone]     BMET No results for input(s): NA, K, CL, CO2, GLUCOSE, BUN, CREATININE, CALCIUM in the last 72 hours. PT/INR No results for input(s): LABPROT, INR in the last 72 hours. ABG No results for input(s): PHART, HCO3 in the last 72 hours.  Invalid input(s): PCO2, PO2  Studies/Results: No results found.   Assessment/Plan: Recurrent UTI's.   He is asymptomatic with mild pyuria on UA today.  The renal US showed normal kidneys but some layered debris and a PVR of 192ml.  I have recommended he double void.  I will just have him return in 6 months with an attempt at a PVR.   Hopefully we can get an accurate PVR with the [place], but his IPP reservoir is anterior to the bladder.  Balanitis.   He has no report issues.  ED.  No issues with the IPP.     No orders of the defined types were placed in this encounter.    Orders Placed This Encounter  Procedures   Microscopic Examination   Urinalysis, Routine w reflex microscopic     Return in about 3 months (around 12/15/2021) for with PVR.    CC: Dr. Wende Neighbors.      Irine Seal 09/16/2021 [phone] Patient ID: Annia Belt, male   DOB: October 19, 1960, 60 y.o.   MRN: [id number]

## 2021-09-16 NOTE — Progress Notes (Signed)
Urological Symptom Review  Patient is experiencing the following symptoms: Urinary tract infection   Review of Systems  Gastrointestinal (upper)  : Negative for upper GI symptoms  Gastrointestinal (lower) : Negative for lower GI symptoms  Constitutional : Negative for symptoms  Skin: Negative for skin symptoms  Eyes: Negative for eye symptoms  Ear/Nose/Throat : Negative for Ear/Nose/Throat symptoms  Hematologic/Lymphatic: Negative for Hematologic/Lymphatic symptoms  Cardiovascular : Negative for cardiovascular symptoms  Respiratory : Negative for respiratory symptoms  Endocrine: Negative for endocrine symptoms  Musculoskeletal: Negative for musculoskeletal symptoms  Neurological: Negative for neurological symptoms  Psychologic: Negative for psychiatric symptoms

## 2021-10-28 ENCOUNTER — Ambulatory Visit (INDEPENDENT_AMBULATORY_CARE_PROVIDER_SITE_OTHER): Payer: Medicare Other | Admitting: Orthopaedic Surgery

## 2021-10-28 ENCOUNTER — Other Ambulatory Visit: Payer: Self-pay

## 2021-10-28 ENCOUNTER — Encounter: Payer: Self-pay | Admitting: Orthopaedic Surgery

## 2021-10-28 VITALS — Ht 71.0 in | Wt 270.0 lb

## 2021-10-28 DIAGNOSIS — M25572 Pain in left ankle and joints of left foot: Secondary | ICD-10-CM

## 2021-10-28 DIAGNOSIS — Z89511 Acquired absence of right leg below knee: Secondary | ICD-10-CM

## 2021-10-28 DIAGNOSIS — L84 Corns and callosities: Secondary | ICD-10-CM | POA: Insufficient documentation

## 2021-10-28 MED ORDER — NITROGLYCERIN 0.2 MG/HR TD PT24: MEDICATED_PATCH | TRANSDERMAL | 3 refills | Status: AC

## 2021-10-28 NOTE — Progress Notes (Signed)
Office Visit Note   Patient: Albert [name]           Date of Birth: 1961/05/21           MRN: [id number] Visit Date: 10/28/2021              Requested by: Celene Squibb, MD [place],  [place] [address] PCP: Celene Squibb, MD   Assessment & Plan: Visit Diagnoses:  1. Hx of right BKA ([place])   2. Foot callus     Plan: After discussion informed consent using sterile 10 blade thick calluses debrided down to soft skin at the tip of his left transmit.  We discussed foot care to his wife can help him he is had a stroke in the past some difficulty reaching down the bottom of his foot.  Still using compression stocking.  Prescription written for [facility] either new pad or liner revision.  Follow-up here as needed.  Follow-Up Instructions: No follow-ups on file.   Orders:  No orders of the defined types were placed in this encounter.  Meds ordered this encounter  Medications   nitroGLYCERIN (NITRODUR - DOSED IN MG/24 HR) 0.2 mg/hr patch    Sig: APPLY 1 PATCH ONTO SKIN  DAILY    Dispense:  90 patch    Refill:  3      Procedures: No procedures performed   Clinical Data: No additional findings.   Subjective: Chief Complaint  Patient presents with   Right Leg - Follow-up    Needs rx for prosthetic and supplies   left foot callus    HPI test test 61 year old male seen with problems with right BKA stump irritation over the tibial crest.  He had previous buildup anterior liner placed in his socket over a year ago the cushion is now worn out getting hard firm and is having to put a small dressings over the tibial crest to prevent skin problems.  He did have some problems with the skin but it gradually healed up keeping his stump off.  He now needs to be seen by the prosthetic company for either adding a new pad or possible liner change since his stump continues to shrink.  Patient's other problem has been his left foot with tender callus at the tip post  transmetatarsal amputation.  He has not been doing any trimming and he has to centimeter thick callus and is requesting this to be trimmed.  Patient seen Dr. Sharol Given in the past but now lives in [place] area and prefers to have follow-up here since he has transportation problems.  Review of Systems plus for diabetes all other systems noncontributory to HPI.  He has had cardiac problems non-STEMI in the past.   Objective: Vital Signs: Ht 5\' 11"  (1.803 m)    Wt 270 lb (122.5 kg)    BMI 37.66 kg/m   Physical Exam Constitutional:      Appearance: He is well-developed.  HENT:     Head: Normocephalic and atraumatic.     Right Ear: External ear normal.     Left Ear: External ear normal.  Eyes:     Pupils: Pupils are equal, round, and reactive to light.  Neck:     Thyroid: No thyromegaly.     Trachea: No tracheal deviation.  Cardiovascular:     Rate and Rhythm: Normal rate.  Pulmonary:     Effort: Pulmonary effort is normal.     Breath sounds: No wheezing.  Abdominal:     General: Bowel sounds are normal.     Palpations: Abdomen is soft.  Musculoskeletal:     Cervical back: Neck supple.  Skin:    General: Skin is warm and dry.     Capillary Refill: Capillary refill takes less than 2 seconds.  Neurological:     Mental Status: He is alert and oriented to person, place, and time.  Psychiatric:        Behavior: Behavior normal.        Thought Content: Thought content normal.        Judgment: Judgment normal.    Ortho Exam tip of right BKA stump looks good.  He has bandages attached over the iliac crest and the pad inside his socket is gotten hard over compression from greater than a year use.  Left transmit without cellulitis no plantar foot lesions he has a thick 1 to 2 cm callus.  Specialty Comments:  No specialty comments available.  Imaging: No results found.   PMFS History: Patient Active Problem List   Diagnosis Date Noted   Foot callus 10/28/2021   Cellulitis of left lower  extremity    Cellulitis in diabetic foot ([place]) 12/29/2018   Sebaceous cyst    Amputated below knee, right ([place]) 10/19/2017   History of transmetatarsal amputation of left foot ([place]) 06/12/2017   Special screening for malignant neoplasms, colon    Midfoot ulcer, left, limited to breakdown of skin ([place]) 11/18/2016   ARF (acute renal failure) ([place]) 02/08/2016   Chronic combined systolic and diastolic CHF (congestive heart failure) ([place]) 02/08/2016   Splenic infarct 38/45/3646   Acute systolic CHF (congestive heart failure) ([place]) 08/03/2013   Hx of right BKA ([place]) 07/26/2013   Anemia 07/15/2013   Hyperkalemia 07/15/2013   Hyposmolality and/or hyponatremia 07/15/2013   Acute respiratory failure ([place]) 07/15/2013   Mitral regurgitation 07/15/2013   Oliguria 07/15/2013   Bacteremia due to Staphylococcus aureus 07/15/2013   Hyperchloremia 07/15/2013   Acute kidney failure ([place]) 07/14/2013   Hypotension, unspecified 80/32/1224   Acute systolic heart failure ([place]) 07/12/2013   Acute pulmonary edema ([place]) 07/11/2013   Pneumonia, organism unspecified([place]) 07/11/2013   Hypoxemia 07/11/2013   Cardiomyopathy, ischemic 07/03/2013   Coronary atherosclerosis of native coronary artery 07/01/2013   NSTEMI (non-ST elevated myocardial infarction) ([place]) 06/26/2013   AKI (acute kidney injury) ([place]) 06/25/2013   Shock circulatory ([place]) 06/25/2013   Dehydration with hyponatremia 06/25/2013   Leukocytosis 06/25/2013   Sepsis(995.91) 06/25/2013   Aftercare following surgery of the circulatory system, NEC 02/11/2013   Peripheral vascular disease, unspecified ([place]) 12/31/2012   Diabetes mellitus out of control 12/17/2012   Essential hypertension, benign 12/17/2012   Atherosclerotic peripheral vascular disease ([place]) 12/17/2012   Hypercholesteremia    Past Medical History:  Diagnosis Date   Allergic rhinitis    Anemia    takes supplement   Chronic cough    Chronic sinusitis    Complication of  anesthesia    couldn't swallow and talk   Coronary artery disease    CVA (cerebral infarction) 2011   R hand deficit   Diabetes mellitus without complication ([place]) ~8250   last HbA1c ~9 .... fasting 160s   GERD (gastroesophageal reflux disease)    Heart murmur    Hypercholesteremia    Hypertension    Myocardial infarction ([place]) 2014   Osteomyelitis ([place])    first and third metatarsal   Pneumonia    hx of walking   Splenic infarct  setting of lupus anticoagulant   Stroke [facility]) 2011   right arm weakness    Family History  Problem Relation Age of Onset   Pancreatic cancer Mother    Diabetes Mother    Hyperlipidemia Mother    Diabetes Other    Breast cancer Sister    Depression Maternal Grandmother    Heart disease Maternal Grandmother    Depression Maternal Grandfather    Heart disease Maternal Grandfather    Depression Paternal Grandmother    Heart disease Paternal Grandmother    Depression Paternal Grandfather    Heart disease Paternal Grandfather     Past Surgical History:  Procedure Laterality Date   4th Toe Amputation Left Jan. 2014   4th toe in [place]   AMPUTATION Left 12/08/2012   Procedure: AMPUTATION DIGIT;  Surgeon: Mcarthur Rossetti, MD;  Location: [place];  Service: Orthopedics;  Laterality: Left;  3rd toe amputation possible 5th toe amputation   AMPUTATION Left 12/11/2012   Procedure: Amputation of 2nd Toe;  Surgeon: Mcarthur Rossetti, MD;  Location: [place];  Service: Orthopedics;  Laterality: Left;   AMPUTATION Right 06/29/2013   Procedure: AMPUTATION BELOW KNEE;  Surgeon: Newt Minion, MD;  Location: [place];  Service: Orthopedics;  Laterality: Right;   AMPUTATION Left 08/29/2014   Procedure: Left Great Toe Amputation at MTP;  Surgeon: Newt Minion, MD;  Location: [place];  Service: Orthopedics;  Laterality: Left;   AMPUTATION Left 10/31/2014   Procedure: Midfoot Amputation;  Surgeon: Newt Minion, MD;  Location: [place];  Service: Orthopedics;   Laterality: Left;   BYPASS GRAFT POPLITEAL TO TIBIAL Left 12/13/2012   Procedure: BYPASS GRAFT POPLITEAL TO TIBIAL;  Surgeon: Serafina Mitchell, MD;  Location: [place];  Service: Vascular;  Laterality: Left;  Left Popliteal to Posterior Tibial Bypass Graft with reversed saphenous vein graft   CARDIAC CATHETERIZATION     COLONOSCOPY [name] 04/14/2017   Procedure: COLONOSCOPY;  Surgeon: Danie Binder, MD;  [facility];  Service: Endoscopy;  Laterality: [name];  1000   I & D EXTREMITY Left 12/08/2012   Procedure: IRRIGATION AND DEBRIDEMENT EXTREMITY;  Surgeon: Mcarthur Rossetti, MD;  Location: [place];  Service: Orthopedics;  Laterality: Left;   I & D EXTREMITY Left 12/11/2012   Procedure: REPEAT I&D LEFT FOOT;  Surgeon: Mcarthur Rossetti, MD;  Location: [place];  Service: Orthopedics;  Laterality: Left;   INCISION AND DRAINAGE Right 06/25/2013   Procedure: INCISION AND DRAINAGE RIGHT FOOT;  Surgeon: Mcarthur Rossetti, MD;  [facility];  Service: Orthopedics;  Laterality: Right;   LEFT HEART CATHETERIZATION WITH CORONARY ANGIOGRAM [name] 06/28/2013   Procedure: LEFT HEART CATHETERIZATION WITH CORONARY ANGIOGRAM;  Surgeon: Burnell Blanks, MD;  Location: [facility] CATH LAB;  Service: Cardiovascular;  Laterality: [name];   MASS EXCISION [name] 11/15/2017   Procedure: EXCISION 4CM CYST ON NECK;  Surgeon: Virl Cagey, MD;  [facility];  Service: General;  Laterality: [name];   PENILE PROSTHESIS IMPLANT     POLYPECTOMY  04/14/2017   Procedure: POLYPECTOMY;  Surgeon: Danie Binder, MD;  [facility];  Service: Endoscopy;;  colon   STUMP REVISION Right 08/16/2013   Procedure: STUMP REVISION- right Below Knee Amputation;  Surgeon: Newt Minion, MD;  Location: [place];  Service: Orthopedics;  Laterality: Right;   TEE WITHOUT CARDIOVERSION [name] 07/03/2013   Procedure: TRANSESOPHAGEAL ECHOCARDIOGRAM (TEE);  Surgeon: Larey Dresser, MD;  Location: [place];  Service: Cardiovascular;   Laterality: [name];   TONSILLECTOMY AND ADENOIDECTOMY     Social History   Occupational History   Occupation: Programmer, systems: [employer]    Comment: Disabled  Tobacco Use   Smoking status: Never   Smokeless tobacco: Never  Vaping Use   Vaping Use: Never used  Substance and Sexual Activity   Alcohol use: No    Alcohol/week: 0.0 standard drinks   Drug use: No   Sexual activity: Never

## 2021-11-11 DIAGNOSIS — E782 Mixed hyperlipidemia: Secondary | ICD-10-CM | POA: Diagnosis not present

## 2021-11-11 DIAGNOSIS — E1165 Type 2 diabetes mellitus with hyperglycemia: Secondary | ICD-10-CM | POA: Diagnosis not present

## 2021-11-17 DIAGNOSIS — I5042 Chronic combined systolic (congestive) and diastolic (congestive) heart failure: Secondary | ICD-10-CM | POA: Diagnosis not present

## 2021-11-17 DIAGNOSIS — Z89511 Acquired absence of right leg below knee: Secondary | ICD-10-CM | POA: Diagnosis not present

## 2021-11-17 DIAGNOSIS — I2510 Atherosclerotic heart disease of native coronary artery without angina pectoris: Secondary | ICD-10-CM | POA: Diagnosis not present

## 2021-11-17 DIAGNOSIS — N182 Chronic kidney disease, stage 2 (mild): Secondary | ICD-10-CM | POA: Diagnosis not present

## 2021-11-17 DIAGNOSIS — D649 Anemia, unspecified: Secondary | ICD-10-CM | POA: Diagnosis not present

## 2021-11-17 DIAGNOSIS — E1165 Type 2 diabetes mellitus with hyperglycemia: Secondary | ICD-10-CM | POA: Diagnosis not present

## 2021-11-17 DIAGNOSIS — I10 Essential (primary) hypertension: Secondary | ICD-10-CM | POA: Diagnosis not present

## 2021-11-17 DIAGNOSIS — L989 Disorder of the skin and subcutaneous tissue, unspecified: Secondary | ICD-10-CM | POA: Insufficient documentation

## 2021-11-17 DIAGNOSIS — N529 Male erectile dysfunction, unspecified: Secondary | ICD-10-CM | POA: Insufficient documentation

## 2021-11-17 DIAGNOSIS — K219 Gastro-esophageal reflux disease without esophagitis: Secondary | ICD-10-CM | POA: Diagnosis not present

## 2021-11-17 DIAGNOSIS — Z9189 Other specified personal risk factors, not elsewhere classified: Secondary | ICD-10-CM | POA: Diagnosis not present

## 2021-11-17 DIAGNOSIS — E782 Mixed hyperlipidemia: Secondary | ICD-10-CM | POA: Diagnosis not present

## 2021-11-17 DIAGNOSIS — N390 Urinary tract infection, site not specified: Secondary | ICD-10-CM | POA: Insufficient documentation

## 2021-11-18 ENCOUNTER — Encounter: Payer: Self-pay | Admitting: Cardiology

## 2021-11-22 ENCOUNTER — Encounter: Payer: Self-pay | Admitting: *Deleted

## 2021-12-23 ENCOUNTER — Ambulatory Visit: Payer: Medicare Other | Admitting: Urology

## 2022-01-18 ENCOUNTER — Ambulatory Visit: Payer: Medicare Other

## 2022-02-17 DIAGNOSIS — Z89511 Acquired absence of right leg below knee: Secondary | ICD-10-CM | POA: Diagnosis not present

## 2022-02-24 ENCOUNTER — Ambulatory Visit (INDEPENDENT_AMBULATORY_CARE_PROVIDER_SITE_OTHER): Payer: Medicare Other | Admitting: Cardiology

## 2022-02-24 ENCOUNTER — Encounter: Payer: Self-pay | Admitting: Cardiology

## 2022-02-24 VITALS — BP 104/60 | HR 70 | Ht 71.0 in | Wt 258.6 lb

## 2022-02-24 DIAGNOSIS — I6523 Occlusion and stenosis of bilateral carotid arteries: Secondary | ICD-10-CM | POA: Diagnosis not present

## 2022-02-24 DIAGNOSIS — I5022 Chronic systolic (congestive) heart failure: Secondary | ICD-10-CM | POA: Diagnosis not present

## 2022-02-24 DIAGNOSIS — I2510 Atherosclerotic heart disease of native coronary artery without angina pectoris: Secondary | ICD-10-CM | POA: Diagnosis not present

## 2022-02-24 DIAGNOSIS — E782 Mixed hyperlipidemia: Secondary | ICD-10-CM | POA: Diagnosis not present

## 2022-02-24 DIAGNOSIS — I10 Essential (primary) hypertension: Secondary | ICD-10-CM | POA: Diagnosis not present

## 2022-02-24 NOTE — Patient Instructions (Signed)
Medication Instructions:  Your physician recommends that you continue on your current medications as directed. Please refer to the Current Medication list given to you today.  Labwork: none  Testing/Procedures: Your physician has requested that you have a carotid duplex. This test is an ultrasound of the carotid arteries in your neck. It looks at blood flow through these arteries that supply the brain with blood. Allow one hour for this exam. There are no restrictions or special instructions.  Follow-Up: Your physician recommends that you schedule a follow-up appointment in: 6 months  Any Other Special Instructions Will Be Listed Below (If Applicable).  If you need a refill on your cardiac medications before your next appointment, please call your pharmacy. 

## 2022-02-24 NOTE — Progress Notes (Signed)
? ? ? ?Clinical Summary ?[name] is a 61 y.o.male seen today for follow up of the following medical problems.  ?  ?1. Chronic combined systolic/diastolic heart failure ?- ICM, LVEF 40-45% by echo 12/2013, grade II diastolic dysfunction ?- 01/2017 LVEF 70%, grade I diastolic dysfunction ?  ?  ?- no recent SOB/DOE ?- compliant with meds ?  ?2. CAD ?- hx of demand ischemia 06/2013 in setting of sepsis with necrotic foot wound. ?- cath at that time 06/2013 LM patent, LAD 40% prox, 70% mid follow by 60% lesion, distal 90%. Diag 99%. LCX 80-90% prox, OM1 80%, OM2 99%, OM3 99 % (all OMs are small), RCA small non-dom mid 99%. LVEF 40% by LVgram.   ?- CAD was managed medically after discussions with CT surgery at that time, with plans to reevaluate once recurring infection issues resolved.   ?  ?-no recent chest pains ?  ?4. PAD ?- followed by vascular and [facility] ?- previous right BKA, previous multiple toe amputations. 08/30/14 treated for osteo and ulcer of left great toe with amputation. Jan 2016 had left midfoot amputation.   ?- Followed by ortho Dr Sharol Given.  ?  ?  ?  ?5. Hyperlipidemia ? - he is on atorvastatin '80mg'$  daily ? ?Jan 2023 TC 107 TG 111 HDL 27 LDL 59 ?- upcoming labs with pcp ?  ?6. HTN ?- compliant with meds ?  ?7. Carotid stenosis ? 07/2020 mild bilateral disease ?  ?SH:  ?[place] with recent severe case of viral meningitis, still recovering ?Brother in law recently passed from stroke ?He is a Public house manager ? ?Past Medical History:  ?Diagnosis Date  ? Allergic rhinitis   ? Anemia   ? takes supplement  ? Chronic cough   ? Chronic sinusitis   ? Complication of anesthesia   ? couldn't swallow and talk  ? Coronary artery disease   ? CVA (cerebral infarction) 2011  ? R hand deficit  ? Diabetes mellitus without complication ([place]) ~0174  ? last HbA1c ~9 .... fasting 160s  ? GERD (gastroesophageal reflux disease)   ? Heart murmur   ? Hypercholesteremia   ? Hypertension   ? Myocardial infarction [facility]) 2014  ?  Osteomyelitis ([place])   ? first and third metatarsal  ? Pneumonia   ? hx of walking  ? Splenic infarct   ? setting of lupus anticoagulant  ? Stroke [facility]) 2011  ? right arm weakness  ? ? ? ?Allergies  ?Allergen Reactions  ? Amoxicillin Nausea And Vomiting  ?  Has patient had a PCN reaction causing immediate rash, facial/tongue/throat swelling, SOB or lightheadedness with hypotension: No ?Has patient had a PCN reaction causing severe rash involving mucus membranes or skin necrosis: No ?Has patient had a PCN reaction that required hospitalization: No ?Has patient had a PCN reaction occurring within the last 10 years: Yes ?If all of the above answers are "NO", then may proceed with Cephalosporin use. ?  ? ? ? ?Current Outpatient Medications  ?Medication Sig Dispense Refill  ? aspirin EC 81 MG tablet Take 1 tablet (81 mg total) by mouth daily. 30 tablet 1  ? atorvastatin (LIPITOR) 80 MG tablet Take 80 mg by mouth every other day.     ? carvedilol (COREG) 25 MG tablet Take 25 mg by mouth 2 (two) times daily.     ? clotrimazole-betamethasone (LOTRISONE) cream Apply 1 application topically 2 (two) times daily. 30 g 2  ? furosemide (LASIX) 80 MG tablet Take  80 mg by mouth daily. May take additional 40 mg as needed for weight gain    ? hydrALAZINE (APRESOLINE) 10 MG tablet Take 1 tablet (10 mg total) by mouth 3 (three) times daily. (Patient taking differently: Take 10 mg by mouth 2 (two) times daily.) 120 tablet 1  ? Insulin Lispro (HUMALOG KWIKPEN [place]) Inject 25 Units into the skin in the morning, at noon, and at bedtime.    ? losartan (COZAAR) 50 MG tablet TAKE 1 TABLET BY MOUTH  DAILY 90 tablet 0  ? metFORMIN (GLUCOPHAGE-XR) 500 MG 24 hr tablet Take 1 tablet by mouth in the morning and at bedtime.     ? mupirocin ointment (BACTROBAN) 2 % Apply 1 application topically daily. (Patient taking differently: Apply 1 application topically once a week. Apply to left foot) 22 g 6  ? nitroGLYCERIN (NITRODUR - DOSED IN MG/24 HR) 0.2  mg/hr patch APPLY 1 PATCH ONTO SKIN  DAILY 90 patch 3  ? pantoprazole (PROTONIX) 40 MG tablet Take 1 tablet (40 mg total) by mouth daily. (Patient taking differently: Take 40 mg by mouth every other day.) 30 tablet 1  ? pentoxifylline (TRENTAL) 400 MG CR tablet Take 1 tablet (400 mg total) by mouth 3 (three) times daily with meals. (Patient taking differently: Take 400 mg by mouth 2 (two) times daily with a meal.) 90 tablet 11  ? potassium chloride (K-DUR,KLOR-CON) 10 MEQ tablet Take 1 tablet (10 mEq total) by mouth daily. 30 tablet 6  ? TRESIBA FLEXTOUCH 100 UNIT/ML SOPN FlexTouch Pen Inject 57 Units into the skin daily.  6  ? ?No current facility-administered medications for this visit.  ? ? ? ?Past Surgical History:  ?Procedure Laterality Date  ? 4th Toe Amputation Left Jan. 2014  ? 4th toe in [place]  ? AMPUTATION Left 12/08/2012  ? Procedure: AMPUTATION DIGIT;  Surgeon: Mcarthur Rossetti, MD;  Location: [place];  Service: Orthopedics;  Laterality: Left;  3rd toe amputation possible 5th toe amputation  ? AMPUTATION Left 12/11/2012  ? Procedure: Amputation of 2nd Toe;  Surgeon: Mcarthur Rossetti, MD;  Location: [place];  Service: Orthopedics;  Laterality: Left;  ? AMPUTATION Right 06/29/2013  ? Procedure: AMPUTATION BELOW KNEE;  Surgeon: Newt Minion, MD;  Location: [place];  Service: Orthopedics;  Laterality: Right;  ? AMPUTATION Left 08/29/2014  ? Procedure: Left Great Toe Amputation at MTP;  Surgeon: Newt Minion, MD;  Location: [place];  Service: Orthopedics;  Laterality: Left;  ? AMPUTATION Left 10/31/2014  ? Procedure: Midfoot Amputation;  Surgeon: Newt Minion, MD;  Location: [place];  Service: Orthopedics;  Laterality: Left;  ? BYPASS GRAFT POPLITEAL TO TIBIAL Left 12/13/2012  ? Procedure: BYPASS GRAFT POPLITEAL TO TIBIAL;  Surgeon: Serafina Mitchell, MD;  [facility];  Service: Vascular;  Laterality: Left;  Left Popliteal to Posterior Tibial Bypass Graft with reversed saphenous vein graft  ? CARDIAC  CATHETERIZATION    ? COLONOSCOPY [name] 04/14/2017  ? Procedure: COLONOSCOPY;  Surgeon: Danie Binder, MD;  [facility];  Service: Endoscopy;  Laterality: [name];  1000  ? I & D EXTREMITY Left 12/08/2012  ? Procedure: IRRIGATION AND DEBRIDEMENT EXTREMITY;  Surgeon: Mcarthur Rossetti, MD;  Location: [place];  Service: Orthopedics;  Laterality: Left;  ? I & D EXTREMITY Left 12/11/2012  ? Procedure: REPEAT I&D LEFT FOOT;  Surgeon: Mcarthur Rossetti, MD;  Location: [place];  Service: Orthopedics;  Laterality: Left;  ? INCISION AND  DRAINAGE Right 06/25/2013  ? Procedure: INCISION AND DRAINAGE RIGHT FOOT;  Surgeon: Mcarthur Rossetti, MD;  [facility];  Service: Orthopedics;  Laterality: Right;  ? LEFT HEART CATHETERIZATION WITH CORONARY ANGIOGRAM [name] 06/28/2013  ? Procedure: LEFT HEART CATHETERIZATION WITH CORONARY ANGIOGRAM;  Surgeon: Burnell Blanks, MD;  Location: [facility] CATH LAB;  Service: Cardiovascular;  Laterality: [name];  ? MASS EXCISION [name] 11/15/2017  ? Procedure: EXCISION 4CM CYST ON NECK;  Surgeon: Virl Cagey, MD;  [facility];  Service: General;  Laterality: [name];  ? PENILE PROSTHESIS IMPLANT    ? POLYPECTOMY  04/14/2017  ? Procedure: POLYPECTOMY;  Surgeon: Danie Binder, MD;  [facility];  Service: Endoscopy;;  colon  ? STUMP REVISION Right 08/16/2013  ? Procedure: STUMP REVISION- right Below Knee Amputation;  Surgeon: Newt Minion, MD;  Location: [place];  Service: Orthopedics;  Laterality: Right;  ? TEE WITHOUT CARDIOVERSION [name] 07/03/2013  ? Procedure: TRANSESOPHAGEAL ECHOCARDIOGRAM (TEE);  Surgeon: Larey Dresser, MD;  Location: [place];  Service: Cardiovascular;  Laterality: [name];  ? TONSILLECTOMY AND ADENOIDECTOMY    ? ? ? ?Allergies  ?Allergen Reactions  ? Amoxicillin Nausea And Vomiting  ?  Has patient had a PCN reaction causing immediate rash, facial/tongue/throat swelling, SOB or lightheadedness with hypotension: No ?Has patient had a PCN reaction  causing severe rash involving mucus membranes or skin necrosis: No ?Has patient had a PCN reaction that required hospitalization: No ?Has patient had a PCN reaction occurring within the last 10 years: Yes ?If all of the a

## 2022-02-25 DIAGNOSIS — H25813 Combined forms of age-related cataract, bilateral: Secondary | ICD-10-CM | POA: Diagnosis not present

## 2022-02-25 DIAGNOSIS — Z7984 Long term (current) use of oral hypoglycemic drugs: Secondary | ICD-10-CM | POA: Diagnosis not present

## 2022-02-25 DIAGNOSIS — H524 Presbyopia: Secondary | ICD-10-CM | POA: Diagnosis not present

## 2022-02-25 DIAGNOSIS — Z794 Long term (current) use of insulin: Secondary | ICD-10-CM | POA: Diagnosis not present

## 2022-02-25 DIAGNOSIS — E119 Type 2 diabetes mellitus without complications: Secondary | ICD-10-CM | POA: Diagnosis not present

## 2022-03-09 ENCOUNTER — Ambulatory Visit (INDEPENDENT_AMBULATORY_CARE_PROVIDER_SITE_OTHER): Payer: Medicare Other

## 2022-03-09 DIAGNOSIS — I6523 Occlusion and stenosis of bilateral carotid arteries: Secondary | ICD-10-CM | POA: Diagnosis not present

## 2022-03-10 ENCOUNTER — Telehealth: Payer: Self-pay | Admitting: *Deleted

## 2022-03-10 NOTE — Telephone Encounter (Signed)
Laurine Blazer, LPN  11/14/2079 38:87 AM EDT Back to Top    Notified, copy to pcp.

## 2022-03-10 NOTE — Telephone Encounter (Signed)
-----   Message from Arnoldo Lenis, MD sent at 03/09/2022  2:58 PM EDT ----- Carotid US shows mild blockages on both sides, we will continue to monitor   Zandra Abts MD

## 2022-03-16 DIAGNOSIS — E782 Mixed hyperlipidemia: Secondary | ICD-10-CM | POA: Diagnosis not present

## 2022-03-16 DIAGNOSIS — I129 Hypertensive chronic kidney disease with stage 1 through stage 4 chronic kidney disease, or unspecified chronic kidney disease: Secondary | ICD-10-CM | POA: Diagnosis not present

## 2022-03-16 DIAGNOSIS — N182 Chronic kidney disease, stage 2 (mild): Secondary | ICD-10-CM | POA: Diagnosis not present

## 2022-03-16 DIAGNOSIS — E1165 Type 2 diabetes mellitus with hyperglycemia: Secondary | ICD-10-CM | POA: Diagnosis not present

## 2022-03-23 DIAGNOSIS — E782 Mixed hyperlipidemia: Secondary | ICD-10-CM | POA: Diagnosis not present

## 2022-03-23 DIAGNOSIS — D649 Anemia, unspecified: Secondary | ICD-10-CM | POA: Diagnosis not present

## 2022-03-23 DIAGNOSIS — K219 Gastro-esophageal reflux disease without esophagitis: Secondary | ICD-10-CM | POA: Insufficient documentation

## 2022-03-23 DIAGNOSIS — Z9189 Other specified personal risk factors, not elsewhere classified: Secondary | ICD-10-CM | POA: Diagnosis not present

## 2022-03-23 DIAGNOSIS — I10 Essential (primary) hypertension: Secondary | ICD-10-CM | POA: Diagnosis not present

## 2022-03-23 DIAGNOSIS — I2510 Atherosclerotic heart disease of native coronary artery without angina pectoris: Secondary | ICD-10-CM | POA: Diagnosis not present

## 2022-03-23 DIAGNOSIS — I5042 Chronic combined systolic (congestive) and diastolic (congestive) heart failure: Secondary | ICD-10-CM | POA: Diagnosis not present

## 2022-03-23 DIAGNOSIS — E1165 Type 2 diabetes mellitus with hyperglycemia: Secondary | ICD-10-CM | POA: Diagnosis not present

## 2022-03-23 DIAGNOSIS — N182 Chronic kidney disease, stage 2 (mild): Secondary | ICD-10-CM | POA: Diagnosis not present

## 2022-03-23 DIAGNOSIS — Z89511 Acquired absence of right leg below knee: Secondary | ICD-10-CM | POA: Diagnosis not present

## 2022-04-07 ENCOUNTER — Encounter: Payer: Self-pay | Admitting: *Deleted

## 2022-04-13 ENCOUNTER — Ambulatory Visit: Payer: Medicare Other

## 2022-05-04 ENCOUNTER — Encounter: Payer: Self-pay | Admitting: *Deleted

## 2022-05-04 NOTE — Patient Instructions (Signed)
Procedure: Colonoscopy  Estimated body mass index is 36.4 kg/m as calculated from the following:   Height as of this encounter: '5\' 11"'$  (1.803 m).   Weight as of this encounter: 261 lb (118.4 kg).   Have you had a colonoscopy before?  04/14/17, Dr. Oneida Alar  Do you have family history of colon cancer  no  Previous colonoscopy with polyps removed? yes  Do you have a history colorectal cancer?   no  Are you diabetic?  Yes type 2  Do you have a prosthetic or mechanical heart valve? no  Do you have a pacemaker/defibrillator?   no  Have you had endocarditis/atrial fibrillation?  no  Do you use supplemental oxygen/CPAP?  no  Have you had joint replacement within the last 12 months?  no  Do you tend to be constipated or have to use laxatives?  no   Do you have history of alcohol use? If yes, how much and how often.  no  Do you have history or are you using drugs? If yes, what do are you  using?  no  Have you ever had a stroke/heart attack?  Yes 2011 stroke, heart attack 2015  Have you ever had a heart or other vascular stent placed,?no  Do you take weight loss medication? no  Do you take any blood-thinning medications such as: (Plavix, aspirin, Coumadin, Aggrenox, Brilinta, Xarelto, Eliquis, Pradaxa, Savaysa or Effient) yes asa '81mg'$   If yes we need the name, milligram, dosage and who is prescribing doctor:               Current Outpatient Medications  Medication Sig Dispense Refill   aspirin EC 81 MG tablet Take 1 tablet (81 mg total) by mouth daily. 30 tablet 1   atorvastatin (LIPITOR) 80 MG tablet Take 80 mg by mouth every other day.      carvedilol (COREG) 25 MG tablet Take 25 mg by mouth 2 (two) times daily.      clotrimazole-betamethasone (LOTRISONE) cream Apply 1 application topically 2 (two) times daily. 30 g 2   furosemide (LASIX) 80 MG tablet Take 80 mg by mouth daily. May take additional 40 mg as needed for weight gain     hydrALAZINE (APRESOLINE) 10 MG tablet Take  1 tablet (10 mg total) by mouth 3 (three) times daily. (Patient taking differently: Take 10 mg by mouth 2 (two) times daily.) 120 tablet 1   Insulin Lispro (HUMALOG KWIKPEN [place]) Inject 25 Units into the skin in the morning, at noon, and at bedtime.     losartan (COZAAR) 50 MG tablet TAKE 1 TABLET BY MOUTH  DAILY 90 tablet 0   metFORMIN (GLUCOPHAGE-XR) 500 MG 24 hr tablet Take 1 tablet by mouth in the morning and at bedtime.      mupirocin ointment (BACTROBAN) 2 % Apply 1 application topically daily. (Patient taking differently: Apply 1 application  topically once a week. Apply to left foot) 22 g 6   nitroGLYCERIN (NITRODUR - DOSED IN MG/24 HR) 0.2 mg/hr patch APPLY 1 PATCH ONTO SKIN  DAILY 90 patch 3   pantoprazole (PROTONIX) 40 MG tablet Take 1 tablet (40 mg total) by mouth daily. (Patient taking differently: Take 40 mg by mouth every other day.) 30 tablet 1   pentoxifylline (TRENTAL) 400 MG CR tablet Take 1 tablet (400 mg total) by mouth 3 (three) times daily with meals. (Patient taking differently: Take 400 mg by mouth 2 (two) times daily with a meal.) 90 tablet 11  potassium chloride (K-DUR,KLOR-CON) 10 MEQ tablet Take 1 tablet (10 mEq total) by mouth daily. 30 tablet 6   TRESIBA FLEXTOUCH 100 UNIT/ML SOPN FlexTouch Pen Inject 57 Units into the skin daily.  6   No current facility-administered medications for this visit.    Allergies  Allergen Reactions   Amoxicillin Nausea And Vomiting    Has patient had a PCN reaction causing immediate rash, facial/tongue/throat swelling, SOB or lightheadedness with hypotension: No Has patient had a PCN reaction causing severe rash involving mucus membranes or skin necrosis: No Has patient had a PCN reaction that required hospitalization: No Has patient had a PCN reaction occurring within the last 10 years: Yes If all of the above answers are "NO", then may proceed with Cephalosporin use.

## 2022-05-05 NOTE — Progress Notes (Signed)
Please schedule. Thanks! 

## 2022-05-06 ENCOUNTER — Encounter: Payer: Self-pay | Admitting: Internal Medicine

## 2022-06-16 DIAGNOSIS — N182 Chronic kidney disease, stage 2 (mild): Secondary | ICD-10-CM | POA: Diagnosis not present

## 2022-06-16 DIAGNOSIS — E782 Mixed hyperlipidemia: Secondary | ICD-10-CM | POA: Diagnosis not present

## 2022-06-16 DIAGNOSIS — E1165 Type 2 diabetes mellitus with hyperglycemia: Secondary | ICD-10-CM | POA: Diagnosis not present

## 2022-06-16 DIAGNOSIS — I129 Hypertensive chronic kidney disease with stage 1 through stage 4 chronic kidney disease, or unspecified chronic kidney disease: Secondary | ICD-10-CM | POA: Diagnosis not present

## 2022-06-24 DIAGNOSIS — E782 Mixed hyperlipidemia: Secondary | ICD-10-CM | POA: Diagnosis not present

## 2022-06-24 DIAGNOSIS — E1165 Type 2 diabetes mellitus with hyperglycemia: Secondary | ICD-10-CM | POA: Diagnosis not present

## 2022-07-01 ENCOUNTER — Encounter: Payer: Self-pay | Admitting: Internal Medicine

## 2022-07-01 DIAGNOSIS — I2510 Atherosclerotic heart disease of native coronary artery without angina pectoris: Secondary | ICD-10-CM | POA: Diagnosis not present

## 2022-07-01 DIAGNOSIS — D649 Anemia, unspecified: Secondary | ICD-10-CM | POA: Diagnosis not present

## 2022-07-01 DIAGNOSIS — N182 Chronic kidney disease, stage 2 (mild): Secondary | ICD-10-CM | POA: Diagnosis not present

## 2022-07-01 DIAGNOSIS — I10 Essential (primary) hypertension: Secondary | ICD-10-CM | POA: Diagnosis not present

## 2022-07-01 DIAGNOSIS — Z89511 Acquired absence of right leg below knee: Secondary | ICD-10-CM | POA: Diagnosis not present

## 2022-07-01 DIAGNOSIS — E1369 Other specified diabetes mellitus with other specified complication: Secondary | ICD-10-CM | POA: Diagnosis not present

## 2022-07-01 DIAGNOSIS — I5042 Chronic combined systolic (congestive) and diastolic (congestive) heart failure: Secondary | ICD-10-CM | POA: Diagnosis not present

## 2022-07-01 DIAGNOSIS — Z9189 Other specified personal risk factors, not elsewhere classified: Secondary | ICD-10-CM | POA: Diagnosis not present

## 2022-07-01 DIAGNOSIS — I739 Peripheral vascular disease, unspecified: Secondary | ICD-10-CM | POA: Diagnosis not present

## 2022-07-01 DIAGNOSIS — Z0001 Encounter for general adult medical examination with abnormal findings: Secondary | ICD-10-CM | POA: Diagnosis not present

## 2022-07-01 DIAGNOSIS — Z23 Encounter for immunization: Secondary | ICD-10-CM | POA: Diagnosis not present

## 2022-07-03 NOTE — Progress Notes (Unsigned)
GI Office Note    Referring Provider: Celene Squibb, MD Primary Care Physician:  Celene Squibb, MD  Primary Gastroenterologist: Dr. Abbey Chatters (previously Dr. Oneida Alar)  Chief Complaint   No chief complaint on file.    History of Present Illness   PAETON STUDER is a 61 y.o. male presenting today at the request of [place] Crane, Edwinna Areola, MD for ***evaluation prior to scheduling surveillance colonoscopy.  Last colonoscopy June 2018: 4 mm polyp in the mid ascending colon, rectosigmoid, sigmoid, transverse colon moderately redundant.  External and internal hemorrhoids found on retroflexion.  Pathology revealing tubular adenoma.Advise repeat colonoscopy in 5-10 years and follow high-fiber diet.  Today:     Current Outpatient Medications  Medication Sig Dispense Refill   aspirin EC 81 MG tablet Take 1 tablet (81 mg total) by mouth daily. 30 tablet 1   atorvastatin (LIPITOR) 80 MG tablet Take 80 mg by mouth every other day.      carvedilol (COREG) 25 MG tablet Take 25 mg by mouth 2 (two) times daily.      clotrimazole-betamethasone (LOTRISONE) cream Apply 1 application topically 2 (two) times daily. 30 g 2   furosemide (LASIX) 80 MG tablet Take 80 mg by mouth daily. May take additional 40 mg as needed for weight gain     hydrALAZINE (APRESOLINE) 10 MG tablet Take 1 tablet (10 mg total) by mouth 3 (three) times daily. (Patient taking differently: Take 10 mg by mouth 2 (two) times daily.) 120 tablet 1   Insulin Lispro (HUMALOG KWIKPEN [place]) Inject 25 Units into the skin in the morning, at noon, and at bedtime.     losartan (COZAAR) 50 MG tablet TAKE 1 TABLET BY MOUTH  DAILY 90 tablet 0   metFORMIN (GLUCOPHAGE-XR) 500 MG 24 hr tablet Take 1 tablet by mouth in the morning and at bedtime.      mupirocin ointment (BACTROBAN) 2 % Apply 1 application topically daily. (Patient taking differently: Apply 1 application  topically once a week. Apply to left foot) 22 g 6   nitroGLYCERIN (NITRODUR - DOSED IN MG/24  HR) 0.2 mg/hr patch APPLY 1 PATCH ONTO SKIN  DAILY 90 patch 3   pantoprazole (PROTONIX) 40 MG tablet Take 1 tablet (40 mg total) by mouth daily. (Patient taking differently: Take 40 mg by mouth every other day.) 30 tablet 1   pentoxifylline (TRENTAL) 400 MG CR tablet Take 1 tablet (400 mg total) by mouth 3 (three) times daily with meals. (Patient taking differently: Take 400 mg by mouth 2 (two) times daily with a meal.) 90 tablet 11   potassium chloride (K-DUR,KLOR-CON) 10 MEQ tablet Take 1 tablet (10 mEq total) by mouth daily. 30 tablet 6   TRESIBA FLEXTOUCH 100 UNIT/ML SOPN FlexTouch Pen Inject 57 Units into the skin daily.  6   No current facility-administered medications for this visit.    Past Medical History:  Diagnosis Date   Allergic rhinitis    Anemia    takes supplement   Chronic cough    Chronic sinusitis    Complication of anesthesia    couldn't swallow and talk   Coronary artery disease    CVA (cerebral infarction) 2011   R hand deficit   Diabetes mellitus without complication ([place]) ~6160   last HbA1c ~9 .... fasting 160s   GERD (gastroesophageal reflux disease)    Heart murmur    Hypercholesteremia    Hypertension    Myocardial infarction ([place]) 2014   Osteomyelitis ([place])  first and third metatarsal   Pneumonia    hx of walking   Splenic infarct    setting of lupus anticoagulant   Stroke [facility]) 2011   right arm weakness    Past Surgical History:  Procedure Laterality Date   4th Toe Amputation Left Jan. 2014   4th toe in [place]   AMPUTATION Left 12/08/2012   Procedure: AMPUTATION DIGIT;  Surgeon: Mcarthur Rossetti, MD;  Location: [place];  Service: Orthopedics;  Laterality: Left;  3rd toe amputation possible 5th toe amputation   AMPUTATION Left 12/11/2012   Procedure: Amputation of 2nd Toe;  Surgeon: Mcarthur Rossetti, MD;  Location: [place];  Service: Orthopedics;  Laterality: Left;   AMPUTATION Right 06/29/2013   Procedure: AMPUTATION BELOW KNEE;   Surgeon: Newt Minion, MD;  Location: [place];  Service: Orthopedics;  Laterality: Right;   AMPUTATION Left 08/29/2014   Procedure: Left Great Toe Amputation at MTP;  Surgeon: Newt Minion, MD;  Location: [place];  Service: Orthopedics;  Laterality: Left;   AMPUTATION Left 10/31/2014   Procedure: Midfoot Amputation;  Surgeon: Newt Minion, MD;  Location: [place];  Service: Orthopedics;  Laterality: Left;   BYPASS GRAFT POPLITEAL TO TIBIAL Left 12/13/2012   Procedure: BYPASS GRAFT POPLITEAL TO TIBIAL;  Surgeon: Serafina Mitchell, MD;  Location: [place];  Service: Vascular;  Laterality: Left;  Left Popliteal to Posterior Tibial Bypass Graft with reversed saphenous vein graft   CARDIAC CATHETERIZATION     COLONOSCOPY [name] 04/14/2017   Procedure: COLONOSCOPY;  Surgeon: Danie Binder, MD;  [facility];  Service: Endoscopy;  Laterality: [name];  1000   I & D EXTREMITY Left 12/08/2012   Procedure: IRRIGATION AND DEBRIDEMENT EXTREMITY;  Surgeon: Mcarthur Rossetti, MD;  Location: [place];  Service: Orthopedics;  Laterality: Left;   I & D EXTREMITY Left 12/11/2012   Procedure: REPEAT I&D LEFT FOOT;  Surgeon: Mcarthur Rossetti, MD;  Location: [place];  Service: Orthopedics;  Laterality: Left;   INCISION AND DRAINAGE Right 06/25/2013   Procedure: INCISION AND DRAINAGE RIGHT FOOT;  Surgeon: Mcarthur Rossetti, MD;  [facility];  Service: Orthopedics;  Laterality: Right;   LEFT HEART CATHETERIZATION WITH CORONARY ANGIOGRAM [name] 06/28/2013   Procedure: LEFT HEART CATHETERIZATION WITH CORONARY ANGIOGRAM;  Surgeon: Burnell Blanks, MD;  Location: [facility] CATH LAB;  Service: Cardiovascular;  Laterality: [name];   MASS EXCISION [name] 11/15/2017   Procedure: EXCISION 4CM CYST ON NECK;  Surgeon: Virl Cagey, MD;  [facility]: [facility];  Service: General;  Laterality: [name];   PENILE PROSTHESIS IMPLANT     POLYPECTOMY  04/14/2017   Procedure: POLYPECTOMY;  Surgeon: Danie Binder, MD;  [facility];   Service: Endoscopy;;  colon   STUMP REVISION Right 08/16/2013   Procedure: STUMP REVISION- right Below Knee Amputation;  Surgeon: Newt Minion, MD;  Location: [place];  Service: Orthopedics;  Laterality: Right;   TEE WITHOUT CARDIOVERSION [name] 07/03/2013   Procedure: TRANSESOPHAGEAL ECHOCARDIOGRAM (TEE);  Surgeon: Larey Dresser, MD;  Location: [facility] ENDOSCOPY;  Service: Cardiovascular;  Laterality: [name];   TONSILLECTOMY AND ADENOIDECTOMY      Family History  Problem Relation Age of Onset   Pancreatic cancer Mother    Diabetes Mother    Hyperlipidemia Mother    Diabetes Other    Breast cancer Sister    Depression Maternal Grandmother    Heart disease Maternal Grandmother    Depression Maternal Grandfather  Heart disease Maternal Grandfather    Depression Paternal Grandmother    Heart disease Paternal Grandmother    Depression Paternal Grandfather    Heart disease Paternal Grandfather     Allergies as of 07/04/2022 - Review Complete 02/24/2022  Allergen Reaction Noted   Amoxicillin Nausea And Vomiting 12/07/2012    Social History   Socioeconomic History   Marital status: Divorced    Spouse name: Not on file   Number of children: 2   Years of education: Not on file   Highest education level: Not on file  Occupational History   Occupation: Print production planner labor    Employer: [employer]    Comment: Disabled  Tobacco Use   Smoking status: Never   Smokeless tobacco: Never  Vaping Use   Vaping Use: Never used  Substance and Sexual Activity   Alcohol use: No    Alcohol/week: 0.0 standard drinks of alcohol   Drug use: No   Sexual activity: Never  Other Topics Concern   Not on file  Social History Narrative   Lives in [place]   Wife: Engineer, structural   Social Determinants of Health   Financial Resource Strain: Not on file  Food Insecurity: Not on file  Transportation Needs: Not on file  Physical Activity: Not on file  Stress: Not on file  Social Connections: Not on  file  Intimate Partner Violence: Not on file     Review of Systems   Gen: Denies any fever, chills, fatigue, weight loss, lack of appetite.  CV: Denies chest pain, heart palpitations, peripheral edema, syncope.  Resp: Denies shortness of breath at rest or with exertion. Denies wheezing or cough.  GI: see HPI GU : Denies urinary burning, urinary frequency, urinary hesitancy MS: Denies joint pain, muscle weakness, cramps, or limitation of movement.  Derm: Denies rash, itching, dry skin Psych: Denies depression, anxiety, memory loss, and confusion Heme: Denies bruising, bleeding, and enlarged lymph nodes.   Physical Exam   There were no vitals taken for this visit.  General:   Alert and oriented. Pleasant and cooperative. Well-nourished and well-developed.  Head:  Normocephalic and atraumatic. Eyes:  Without icterus, sclera clear and conjunctiva pink.  Ears:  Normal auditory acuity. Mouth:  No deformity or lesions, oral mucosa pink.  Lungs:  Clear to auscultation bilaterally. No wheezes, rales, or rhonchi. No distress.  Heart:  S1, S2 present without murmurs appreciated.  Abdomen:  +BS, soft, non-tender and non-distended. No HSM noted. No guarding or rebound. No masses appreciated.  Rectal:  Deferred  Msk:  Symmetrical without gross deformities. Normal posture. Extremities:  Without edema. Neurologic:  Alert and  oriented x4;  grossly normal neurologically. Skin:  Intact without significant lesions or rashes. Psych:  Alert and cooperative. Normal mood and affect.   Assessment   Albert [name] is a 61 y.o. male with a history of anemia, CAD, CVA in 2011, diabetes, GERD, HLD, HTN, MI in 2014*** presenting today for evaluation prior to scheduling surveillance colonoscopy.  History of colon polyps: Last colonoscopy in June 2018 with a single tubular adenoma removed and redundant colon.  He was advised repeat in 5 to 10 years.   PLAN   *** Proceed with colonoscopy by Dr.  Abbey Chatters  in near future: the risks, benefits, and alternatives have been discussed with the patient in detail. The patient states understanding and desires to proceed.  ASA 3 Hold metformin the night prior to the morning of procedure Insulin: *** Take half your normal  dose of Tresiba the day prior to your procedure.     Venetia Night, MSN, FNP-BC, AGACNP-BC [facility]

## 2022-07-04 ENCOUNTER — Encounter: Payer: Self-pay | Admitting: Gastroenterology

## 2022-07-04 ENCOUNTER — Telehealth: Payer: Self-pay | Admitting: *Deleted

## 2022-07-04 ENCOUNTER — Encounter: Payer: Self-pay | Admitting: *Deleted

## 2022-07-04 ENCOUNTER — Ambulatory Visit (INDEPENDENT_AMBULATORY_CARE_PROVIDER_SITE_OTHER): Payer: Medicare Other | Admitting: Gastroenterology

## 2022-07-04 VITALS — BP 105/68 | HR 76 | Temp 97.2°F | Ht 71.0 in | Wt 257.6 lb

## 2022-07-04 DIAGNOSIS — E1169 Type 2 diabetes mellitus with other specified complication: Secondary | ICD-10-CM

## 2022-07-04 DIAGNOSIS — Z794 Long term (current) use of insulin: Secondary | ICD-10-CM | POA: Diagnosis not present

## 2022-07-04 DIAGNOSIS — Z86010 Personal history of colonic polyps: Secondary | ICD-10-CM | POA: Diagnosis not present

## 2022-07-04 MED ORDER — PEG 3350-KCL-NA BICARB-NACL 420 G PO SOLR: 4000.0000 mL | Freq: Once | ORAL | 0 refills | Status: AC

## 2022-07-04 NOTE — Telephone Encounter (Signed)
PA approved via [facility]. [id number], DOS: Aug 08, 2022 - Oct 16, 2022

## 2022-07-04 NOTE — Telephone Encounter (Signed)
Called pt and provided pre-op appt details. He voiced understanding

## 2022-07-04 NOTE — Patient Instructions (Addendum)
We are scheduling you for colonoscopy in the near future with Dr. Abbey Chatters.  You will receive separate written instructions in the mail but in summary regarding your medications: You need to hold your metformin the night prior to and the morning of your procedure You should take half of your normal dose of Tresiba the night prior to your procedure You will be on a clear liquid diet the day prior to your procedure, therefore you may take your insulin as you normally do with meals but you should monitor your blood sugar more frequently to avoid any hypoglycemic events.  If you need instruction regarding your blood sugars and your insulin, please reach out to the office.  As we discussed you will need to be accompanied by someone to your colonoscopy.  For your preop appointment you may attend this alone, and arrange transportation as you normally do.   It was a pleasure to see you today. I want to create trusting relationships with patients. If you receive a survey regarding your visit,  I greatly appreciate you taking time to fill this out on paper or through your MyChart. I value your feedback.  Venetia Night, MSN, FNP-BC, AGACNP-BC [facility]

## 2022-07-04 NOTE — Addendum Note (Signed)
Addended by: Cheron Every on: 07/04/2022 11:14 AM   Modules accepted: Orders

## 2022-08-04 ENCOUNTER — Encounter (HOSPITAL_COMMUNITY)
Admission: RE | Admit: 2022-08-04 | Discharge: 2022-08-04 | Disposition: A | Discharge status: Home / Self Care | Payer: Medicare Other | Source: Ambulatory Visit | Attending: Internal Medicine | Admitting: Internal Medicine

## 2022-08-04 ENCOUNTER — Other Ambulatory Visit (HOSPITAL_COMMUNITY): Payer: Medicare Other

## 2022-08-08 ENCOUNTER — Ambulatory Visit (HOSPITAL_COMMUNITY): Admission: RE | Admit: 2022-08-08 | Payer: Medicare Other | Source: Ambulatory Visit

## 2022-08-08 ENCOUNTER — Encounter (HOSPITAL_COMMUNITY): Admission: RE | Payer: Self-pay | Source: Ambulatory Visit

## 2022-08-08 ENCOUNTER — Telehealth: Payer: Self-pay | Admitting: *Deleted

## 2022-08-08 SURGERY — COLONOSCOPY WITH PROPOFOL
Anesthesia: Monitor Anesthesia Care

## 2022-08-08 NOTE — Telephone Encounter (Signed)
Patient left VM he is scheduled for procedure today and his ride cancelled on him. He did not want to reschedule until 2024. Made endo aware.  Called pt and made aware received message and procedure cancelled. He said he will call in 2024 when he is ready to r/s.

## 2022-08-17 ENCOUNTER — Encounter: Payer: Self-pay | Admitting: Cardiology

## 2022-08-17 ENCOUNTER — Ambulatory Visit: Payer: Medicare Other | Attending: Cardiology | Admitting: Cardiology

## 2022-08-17 VITALS — BP 112/74 | HR 62 | Ht 71.0 in | Wt 256.2 lb

## 2022-08-17 DIAGNOSIS — I6523 Occlusion and stenosis of bilateral carotid arteries: Secondary | ICD-10-CM

## 2022-08-17 DIAGNOSIS — I2510 Atherosclerotic heart disease of native coronary artery without angina pectoris: Secondary | ICD-10-CM

## 2022-08-17 DIAGNOSIS — I10 Essential (primary) hypertension: Secondary | ICD-10-CM

## 2022-08-17 DIAGNOSIS — I5022 Chronic systolic (congestive) heart failure: Secondary | ICD-10-CM | POA: Diagnosis not present

## 2022-08-17 DIAGNOSIS — E782 Mixed hyperlipidemia: Secondary | ICD-10-CM

## 2022-08-17 NOTE — Patient Instructions (Signed)
Medication Instructions:  Continue all current medications.   Labwork: none  Testing/Procedures: none  Follow-Up: 6 months   Any Other Special Instructions Will Be Listed Below (If Applicable).   If you need a refill on your cardiac medications before your next appointment, please call your pharmacy.  

## 2022-08-17 NOTE — Progress Notes (Signed)
Clinical Summary Mr. Dershem is a 61 y.o.male seen today for follow up of the following medical problems.    1. Chronic combined systolic/diastolic heart failure - ICM, LVEF 40-45% by echo 12/2013, grade II diastolic dysfunction - 0/6269 LVEF 48%, grade I diastolic dysfunction     - no recent SOB, DOE - compliant with meds   2. CAD - hx of demand ischemia 06/2013 in setting of sepsis with necrotic foot wound. - cath at that time 06/2013 LM patent, LAD 40% prox, 70% mid follow by 60% lesion, distal 90%. Diag 99%. LCX 80-90% prox, OM1 80%, OM2 99%, OM3 99 % (all OMs are small), RCA small non-dom mid 99%. LVEF 40% by LVgram.   - CAD was managed medically after discussions with CT surgery at that time, with plans to reevaluate once recurring infection issues resolved.     - no recent chest pains.    4. PAD - followed by vascular and [facility] - previous right BKA, previous multiple toe amputations. 08/30/14 treated for osteo and ulcer of left great toe with amputation. Jan 2016 had left midfoot amputation.   - Followed by ortho Dr Sharol Given.        5. Hyperlipidemia  - he is on atorvastatin '80mg'$  daily   Jan 2023 TC 107 TG 111 HDL 27 LDL 59 - upcoming labs with pcp   06/2022 TC 97 TG 85 HDL 28 LDL 52 - he is on atorvastatin '80mg'$  daily.    6. HTN - compliant with meds   7. Carotid stenosis  07/2020 mild bilateral disease - 02/2022 carotid US: bialteral 1-39% disease   SH:  [place] with recent severe case of viral meningitis, still recovering Brother in Sports coach recently passed from stroke He is a Public house manager Past Medical History:  Diagnosis Date   Allergic rhinitis    Anemia    takes supplement   Chronic cough    Chronic sinusitis    Complication of anesthesia    couldn't swallow and talk   Coronary artery disease    CVA (cerebral infarction) 2011   R hand deficit   Diabetes mellitus without complication ([place]) ~5462   last HbA1c ~9 .... fasting 160s   GERD  (gastroesophageal reflux disease)    Heart murmur    Hypercholesteremia    Hypertension    Myocardial infarction ([place]) 2014   Osteomyelitis ([place])    first and third metatarsal   Pneumonia    hx of walking   Splenic infarct    setting of lupus anticoagulant   Stroke [facility]) 2011   right arm weakness     Allergies  Allergen Reactions   Amoxicillin Nausea And Vomiting    Has patient had a PCN reaction causing immediate rash, facial/tongue/throat swelling, SOB or lightheadedness with hypotension: No Has patient had a PCN reaction causing severe rash involving mucus membranes or skin necrosis: No Has patient had a PCN reaction that required hospitalization: No Has patient had a PCN reaction occurring within the last 10 years: Yes If all of the above answers are "NO", then may proceed with Cephalosporin use.      Current Outpatient Medications  Medication Sig Dispense Refill   aspirin EC 81 MG tablet Take 1 tablet (81 mg total) by mouth daily. 30 tablet 1   atorvastatin (LIPITOR) 80 MG tablet Take 80 mg by mouth every other day.      carvedilol (COREG) 25 MG tablet Take 25 mg by mouth 2 (  two) times daily.      clotrimazole-betamethasone (LOTRISONE) cream Apply 1 application topically 2 (two) times daily. (Patient taking differently: Apply 1 application  topically daily as needed (rash).) 30 g 2   furosemide (LASIX) 80 MG tablet Take 80 mg by mouth daily.     hydrALAZINE (APRESOLINE) 10 MG tablet Take 1 tablet (10 mg total) by mouth 3 (three) times daily. (Patient taking differently: Take 10 mg by mouth 2 (two) times daily.) 120 tablet 1   insulin lispro (HUMALOG) 100 UNIT/ML KwikPen Inject 25 Units into the skin 3 (three) times daily before meals.     losartan (COZAAR) 50 MG tablet TAKE 1 TABLET BY MOUTH  DAILY 90 tablet 0   metFORMIN (GLUCOPHAGE-XR) 500 MG 24 hr tablet Take 500 mg by mouth in the morning and at bedtime.     Multiple Vitamins-Minerals (MENS 50+ MULTIVITAMIN) TABS Take  1 tablet by mouth daily.     nitroGLYCERIN (NITRODUR - DOSED IN MG/24 HR) 0.2 mg/hr patch APPLY 1 PATCH ONTO SKIN  DAILY 90 patch 3   pentoxifylline (TRENTAL) 400 MG CR tablet Take 1 tablet (400 mg total) by mouth 3 (three) times daily with meals. 90 tablet 11   potassium chloride (K-DUR,KLOR-CON) 10 MEQ tablet Take 1 tablet (10 mEq total) by mouth daily. (Patient taking differently: Take 10 mEq by mouth every other day.) 30 tablet 6   TRESIBA FLEXTOUCH 100 UNIT/ML SOPN FlexTouch Pen Inject 57 Units into the skin at bedtime.  6   No current facility-administered medications for this visit.     Past Surgical History:  Procedure Laterality Date   4th Toe Amputation Left Jan. 2014   4th toe in [place]   AMPUTATION Left 12/08/2012   Procedure: AMPUTATION DIGIT;  Surgeon: Mcarthur Rossetti, MD;  Location: [place];  Service: Orthopedics;  Laterality: Left;  3rd toe amputation possible 5th toe amputation   AMPUTATION Left 12/11/2012   Procedure: Amputation of 2nd Toe;  Surgeon: Mcarthur Rossetti, MD;  Location: [place];  Service: Orthopedics;  Laterality: Left;   AMPUTATION Right 06/29/2013   Procedure: AMPUTATION BELOW KNEE;  Surgeon: Newt Minion, MD;  Location: [place];  Service: Orthopedics;  Laterality: Right;   AMPUTATION Left 08/29/2014   Procedure: Left Great Toe Amputation at MTP;  Surgeon: Newt Minion, MD;  Location: [place];  Service: Orthopedics;  Laterality: Left;   AMPUTATION Left 10/31/2014   Procedure: Midfoot Amputation;  Surgeon: Newt Minion, MD;  Location: [place];  Service: Orthopedics;  Laterality: Left;   BYPASS GRAFT POPLITEAL TO TIBIAL Left 12/13/2012   Procedure: BYPASS GRAFT POPLITEAL TO TIBIAL;  Surgeon: Serafina Mitchell, MD;  Location: [place];  Service: Vascular;  Laterality: Left;  Left Popliteal to Posterior Tibial Bypass Graft with reversed saphenous vein graft   CARDIAC CATHETERIZATION     COLONOSCOPY [name] 04/14/2017   Procedure: COLONOSCOPY;  Surgeon: Danie Binder,  MD;  [facility];  Service: Endoscopy;  Laterality: [name];  1000   I & D EXTREMITY Left 12/08/2012   Procedure: IRRIGATION AND DEBRIDEMENT EXTREMITY;  Surgeon: Mcarthur Rossetti, MD;  Location: [place];  Service: Orthopedics;  Laterality: Left;   I & D EXTREMITY Left 12/11/2012   Procedure: REPEAT I&D LEFT FOOT;  Surgeon: Mcarthur Rossetti, MD;  Location: [place];  Service: Orthopedics;  Laterality: Left;   INCISION AND DRAINAGE Right 06/25/2013   Procedure: INCISION AND DRAINAGE RIGHT FOOT;  Surgeon: Mcarthur Rossetti,  MD;  Location: [facility];  Service: Orthopedics;  Laterality: Right;   LEFT HEART CATHETERIZATION WITH CORONARY ANGIOGRAM [name] 06/28/2013   Procedure: LEFT HEART CATHETERIZATION WITH CORONARY ANGIOGRAM;  Surgeon: Burnell Blanks, MD;  [facility];  Service: Cardiovascular;  Laterality: [name];   MASS EXCISION [name] 11/15/2017   Procedure: EXCISION 4CM CYST ON NECK;  Surgeon: Virl Cagey, MD;  Location: [facility];  Service: General;  Laterality: [name];   PENILE PROSTHESIS IMPLANT     POLYPECTOMY  04/14/2017   Procedure: POLYPECTOMY;  Surgeon: Danie Binder, MD;  [facility];  Service: Endoscopy;;  colon   STUMP REVISION Right 08/16/2013   Procedure: STUMP REVISION- right Below Knee Amputation;  Surgeon: Newt Minion, MD;  Location: [place];  Service: Orthopedics;  Laterality: Right;   TEE WITHOUT CARDIOVERSION [name] 07/03/2013   Procedure: TRANSESOPHAGEAL ECHOCARDIOGRAM (TEE);  Surgeon: Larey Dresser, MD;  Location[facility] ENDOSCOPY;  Service: Cardiovascular;  Laterality: [name];   TONSILLECTOMY AND ADENOIDECTOMY       Allergies  Allergen Reactions   Amoxicillin Nausea And Vomiting    Has patient had a PCN reaction causing immediate rash, facial/tongue/throat swelling, SOB or lightheadedness with hypotension: No Has patient had a PCN reaction causing severe rash involving mucus membranes or skin necrosis: No Has patient had a PCN reaction that  required hospitalization: No Has patient had a PCN reaction occurring within the last 10 years: Yes If all of the above answers are "NO", then may proceed with Cephalosporin use.       Family History  Problem Relation Age of Onset   Pancreatic cancer Mother    Diabetes Mother    Hyperlipidemia Mother    Breast cancer Sister    Depression Maternal Grandmother    Heart disease Maternal Grandmother    Depression Maternal Grandfather    Heart disease Maternal Grandfather    Depression Paternal Grandmother    Heart disease Paternal Grandmother    Depression Paternal Grandfather    Heart disease Paternal Grandfather    Diabetes Other    Colon cancer Neg Hx      Social History Mr. Scialdone reports that he has never smoked. He has never used smokeless tobacco. Mr. Lemen reports no history of alcohol use.   Review of Systems CONSTITUTIONAL: No weight loss, fever, chills, weakness or fatigue.  HEENT: Eyes: No visual loss, blurred vision, double vision or yellow sclerae.No hearing loss, sneezing, congestion, runny nose or sore throat.  SKIN: No rash or itching.  CARDIOVASCULAR: per hpi RESPIRATORY: No shortness of breath, cough or sputum.  GASTROINTESTINAL: No anorexia, nausea, vomiting or diarrhea. No abdominal pain or blood.  GENITOURINARY: No burning on urination, no polyuria NEUROLOGICAL: No headache, dizziness, syncope, paralysis, ataxia, numbness or tingling in the extremities. No change in bowel or bladder control.  MUSCULOSKELETAL: No muscle, back pain, joint pain or stiffness.  LYMPHATICS: No enlarged nodes. No history of splenectomy.  PSYCHIATRIC: No history of depression or anxiety.  ENDOCRINOLOGIC: No reports of sweating, cold or heat intolerance. No polyuria or polydipsia.  Marland Kitchen   Physical Examination Today's Vitals   08/17/22 1037  BP: 112/74  Pulse: 62  SpO2: 98%  Weight: 256 lb 3.2 oz (116.2 kg)  Height: '5\' 11"'$  (1.803 m)   Body mass index is 35.73  kg/m.  Gen: resting comfortably, no acute distress HEENT: no scleral icterus, pupils equal round and reactive, no palptable cervical adenopathy,  CV: RRR, no m/r/g no jvd Resp:  Clear to auscultation bilaterally GI: abdomen is soft, non-tender, non-distended, normal bowel sounds, no hepatosplenomegaly MSK: extremities are warm, no edema.  Skin: warm, no rash Neuro:  no focal deficits Psych: appropriate affect   Diagnostic Studies   06/2013 Cath Hemodynamic Findings: Central aortic pressure: 90/61 Left ventricular pressure: 90/19/29   Angiographic Findings:   Left main: Short segment without obstruction.     Left Anterior Descending Artery: Moderate caliber diffusely diseased vessel that courses to the apex. The proximal vessel has diffuse 40% stenosis. The mid vessel has a 70% stenosis followed by a 60% stenosis. The distal vessel has diffuse 90% stenosis. The diagonal is a moderate caliber vessel with ostial 99% stenosis with 99% disease extending into the mid vessel.     Circumflex Artery: Large caliber vessel with proximal 80-90% stenosis. There are three small to moderate caliber obtuse marginal branches. The first OM has mid 80% stenosis. The second OM has a proximal 99% stenosis. The third OM has diffuse 99% stenosis. The left sided posterolateral [place] Nunnery has 99% stenosis.     Right Coronary Artery: Small non-dominant vessel with mid 99% stenosis.    Left Ventricular Angiogram: LVEF=40%   Impression: 1. Triple vessel CAD 2. NSTEMI secondary to demand ischemia from sepsis with severe underlying CAD 3. Mild LV systolic dysfunction   Recommendations: Complex situation. The only option for coronary revascularization is CABG. He is not a favorable candidate for CABG at this time with necrotic right foot, sepsis. Even though not ideal, would favor medical management of CAD while undergoing treatment of his necrotic foot. Would proceed with right BKA this weekend. I have discussed  the case with CT surgery who agrees that CABG is not a good option at this time. Will ask CT surgery to see after he recovers from his BKA. Continue ASA, beta blocker, statin.          Complications:  None. The patient tolerated the procedure well.                11/2012 Carotid US Summary:  - No significant extracranial carotid artery stenosis   demonstrated. Vertebrals are patent with antegrade flow. - ICA/CCA ratio= right = 0.94. left = 0.73. Other specific details can be found in the table(s) above.    Prepared and Electronically Authenticated by   03/2015 Carotid US IMPRESSION: Mild atherosclerotic disease in the carotid arteries, right side greater the left. The peak systolic velocity in the proximal right internal carotid artery is slightly elevated. Estimated degree of stenosis in the right internal carotid artery is close to 50%.   Estimated degree of stenosis in the left internal carotid artery is less than 50%.   Patent vertebral arteries.     12/07/15 Clinic EKG (performed and reviewed in clinic): NSR   01/2017 echo   Study Conclusions   - Left ventricle: The cavity size was normal. Wall thickness was at   the upper limits of normal. The estimated ejection fraction was   50%. Doppler parameters are consistent with abnormal left   ventricular relaxation (grade 1 diastolic dysfunction). - Aortic valve: Mildly calcified annulus. Trileaflet. - Mitral valve: Calcified annulus. Mildly thickened leaflets .   There was trivial regurgitation. - Tricuspid valve: There was trivial regurgitation. Peak RV-RA   gradient (S): 21 mm Hg. - Pulmonary arteries: Systolic pressure could not be accurately   estimated. - Pericardium, extracardiac: A trivial pericardial effusion was   identified.   Impressions:   - Overall limited images. Upper  normal LV wall thickness with LVEF   approximately 50% based on best views. Cannot exclude periapical   wall motion abnormalities. Would  suggest a limited study with   Definity contrast. Calcified mitral annulus with mildly thickened   leaflets and trivial mitral regurgitation. Trivial tricuspid   regurgitation with RV-RA gradient 21 mmHg. Trivial pericardial   effusion.   02/2022 carotid US Summary:  Right Carotid: Velocities in the right ICA are consistent with a 1-39%  stenosis.   Left Carotid: Velocities in the left ICA are consistent with a 1-39%  stenosis.   Vertebrals:  Bilateral vertebral arteries demonstrate antegrade flow.  Subclavians: Normal flow hemodynamics were seen in bilateral subclavian               arteries.      Assessment and Plan   1. Chronic systolic heart failure - LVEF has normalized based on most recent echo - no symptoms, continue current meds   2. CAD - no symptoms, we will continue current meds - EKG today shows SR, chronic RBBB, no acute ischemic changes     3. Hyperlipidemia -LDL is at goal, continue current meds     4. HTN - his bp is at goal, continue current meds   5. Carotid stenosis -mild bilateral disease by recent US, continue to monitor.   F/u 6 months     Arnoldo Lenis, M.D.,

## 2022-08-26 ENCOUNTER — Ambulatory Visit: Payer: Medicare Other | Admitting: Cardiology

## 2022-10-26 DIAGNOSIS — E1369 Other specified diabetes mellitus with other specified complication: Secondary | ICD-10-CM | POA: Diagnosis not present

## 2022-10-26 DIAGNOSIS — E782 Mixed hyperlipidemia: Secondary | ICD-10-CM | POA: Diagnosis not present

## 2022-11-01 ENCOUNTER — Encounter: Payer: Self-pay | Admitting: Internal Medicine

## 2022-11-01 DIAGNOSIS — E1151 Type 2 diabetes mellitus with diabetic peripheral angiopathy without gangrene: Secondary | ICD-10-CM | POA: Diagnosis not present

## 2022-11-01 DIAGNOSIS — Z89511 Acquired absence of right leg below knee: Secondary | ICD-10-CM | POA: Diagnosis not present

## 2022-11-01 DIAGNOSIS — E1169 Type 2 diabetes mellitus with other specified complication: Secondary | ICD-10-CM | POA: Diagnosis not present

## 2022-11-01 DIAGNOSIS — E1369 Other specified diabetes mellitus with other specified complication: Secondary | ICD-10-CM | POA: Diagnosis not present

## 2022-11-01 DIAGNOSIS — I5042 Chronic combined systolic (congestive) and diastolic (congestive) heart failure: Secondary | ICD-10-CM | POA: Diagnosis not present

## 2022-11-01 DIAGNOSIS — D649 Anemia, unspecified: Secondary | ICD-10-CM | POA: Diagnosis not present

## 2022-11-01 DIAGNOSIS — I130 Hypertensive heart and chronic kidney disease with heart failure and stage 1 through stage 4 chronic kidney disease, or unspecified chronic kidney disease: Secondary | ICD-10-CM | POA: Diagnosis not present

## 2022-11-01 DIAGNOSIS — N182 Chronic kidney disease, stage 2 (mild): Secondary | ICD-10-CM | POA: Diagnosis not present

## 2022-11-01 DIAGNOSIS — I2510 Atherosclerotic heart disease of native coronary artery without angina pectoris: Secondary | ICD-10-CM | POA: Diagnosis not present

## 2022-11-01 DIAGNOSIS — E1122 Type 2 diabetes mellitus with diabetic chronic kidney disease: Secondary | ICD-10-CM | POA: Diagnosis not present

## 2022-11-01 DIAGNOSIS — R52 Pain, unspecified: Secondary | ICD-10-CM | POA: Insufficient documentation

## 2023-02-14 DIAGNOSIS — E1369 Other specified diabetes mellitus with other specified complication: Secondary | ICD-10-CM | POA: Diagnosis not present

## 2023-02-14 DIAGNOSIS — E782 Mixed hyperlipidemia: Secondary | ICD-10-CM | POA: Diagnosis not present

## 2023-02-21 DIAGNOSIS — I10 Essential (primary) hypertension: Secondary | ICD-10-CM | POA: Diagnosis not present

## 2023-02-21 DIAGNOSIS — N182 Chronic kidney disease, stage 2 (mild): Secondary | ICD-10-CM | POA: Diagnosis not present

## 2023-02-21 DIAGNOSIS — I130 Hypertensive heart and chronic kidney disease with heart failure and stage 1 through stage 4 chronic kidney disease, or unspecified chronic kidney disease: Secondary | ICD-10-CM | POA: Diagnosis not present

## 2023-02-21 DIAGNOSIS — Z89511 Acquired absence of right leg below knee: Secondary | ICD-10-CM | POA: Diagnosis not present

## 2023-02-21 DIAGNOSIS — E782 Mixed hyperlipidemia: Secondary | ICD-10-CM | POA: Diagnosis not present

## 2023-02-21 DIAGNOSIS — E1369 Other specified diabetes mellitus with other specified complication: Secondary | ICD-10-CM | POA: Diagnosis not present

## 2023-02-21 DIAGNOSIS — I5042 Chronic combined systolic (congestive) and diastolic (congestive) heart failure: Secondary | ICD-10-CM | POA: Diagnosis not present

## 2023-02-21 DIAGNOSIS — E1169 Type 2 diabetes mellitus with other specified complication: Secondary | ICD-10-CM | POA: Diagnosis not present

## 2023-02-21 DIAGNOSIS — E1122 Type 2 diabetes mellitus with diabetic chronic kidney disease: Secondary | ICD-10-CM | POA: Diagnosis not present

## 2023-02-21 DIAGNOSIS — I2510 Atherosclerotic heart disease of native coronary artery without angina pectoris: Secondary | ICD-10-CM | POA: Diagnosis not present

## 2023-02-21 DIAGNOSIS — E1151 Type 2 diabetes mellitus with diabetic peripheral angiopathy without gangrene: Secondary | ICD-10-CM | POA: Diagnosis not present

## 2023-02-21 DIAGNOSIS — D631 Anemia in chronic kidney disease: Secondary | ICD-10-CM | POA: Diagnosis not present

## 2023-02-27 ENCOUNTER — Encounter: Payer: Self-pay | Admitting: *Deleted

## 2023-02-28 DIAGNOSIS — Z7984 Long term (current) use of oral hypoglycemic drugs: Secondary | ICD-10-CM | POA: Diagnosis not present

## 2023-02-28 DIAGNOSIS — H25813 Combined forms of age-related cataract, bilateral: Secondary | ICD-10-CM | POA: Diagnosis not present

## 2023-02-28 DIAGNOSIS — Z794 Long term (current) use of insulin: Secondary | ICD-10-CM | POA: Diagnosis not present

## 2023-02-28 DIAGNOSIS — E119 Type 2 diabetes mellitus without complications: Secondary | ICD-10-CM | POA: Diagnosis not present

## 2023-03-22 ENCOUNTER — Ambulatory Visit: Payer: Medicare Other | Attending: Cardiology | Admitting: Cardiology

## 2023-03-22 ENCOUNTER — Encounter: Payer: Self-pay | Admitting: Cardiology

## 2023-03-22 VITALS — BP 102/60 | HR 71 | Ht 71.0 in | Wt 259.4 lb

## 2023-03-22 DIAGNOSIS — I5022 Chronic systolic (congestive) heart failure: Secondary | ICD-10-CM

## 2023-03-22 DIAGNOSIS — I10 Essential (primary) hypertension: Secondary | ICD-10-CM

## 2023-03-22 DIAGNOSIS — I6523 Occlusion and stenosis of bilateral carotid arteries: Secondary | ICD-10-CM

## 2023-03-22 DIAGNOSIS — I2510 Atherosclerotic heart disease of native coronary artery without angina pectoris: Secondary | ICD-10-CM | POA: Diagnosis not present

## 2023-03-22 NOTE — Patient Instructions (Signed)
Medication Instructions:  Continue all current medications.   Labwork: none  Testing/Procedures: none  Follow-Up: 6 months   Any Other Special Instructions Will Be Listed Below (If Applicable).   If you need a refill on your cardiac medications before your next appointment, please call your pharmacy.  

## 2023-03-22 NOTE — Progress Notes (Signed)
Clinical Summary Albert [name] is a 62 y.o.male seen today for follow up of the following medical problems.      1. Chronic combined systolic/diastolic heart failure - ICM, LVEF 40-45% by echo 12/2013, grade II diastolic dysfunction - 01/2017 LVEF 50%, grade I diastolic dysfunction     - no SOB, DOE, no edema - compliant with meds   2. CAD - hx of demand ischemia 06/2013 in setting of sepsis with necrotic foot wound. - cath at that time 06/2013 LM patent, LAD 40% prox, 70% mid follow by 60% lesion, distal 90%. Diag 99%. LCX 80-90% prox, OM1 80%, OM2 99%, OM3 99 % (all OMs are small), RCA small non-dom mid 99%. LVEF 40% by LVgram.   - CAD was managed medically after discussions with CT surgery at that time, with plans to reevaluate once recurring infection issues resolved.     - no chest pains.    4. PAD - followed by vascular and [facility] - previous right BKA, previous multiple toe amputations. 08/30/14 treated for osteo and ulcer of left great toe with amputation. Jan 2016 had left midfoot amputation.   - Followed by ortho Dr Lajoyce Corners.        5. Hyperlipidemia - he is on atorvastatin 80mg  daily.  01/2023 TC 83 TG 100 HDL 28 LDL 36     6. HTN - compliant with meds   7. 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 fasting 160s   GERD (gastroesophageal reflux disease)    Heart murmur    Hypercholesteremia    Hypertension     Myocardial infarction (HCC) 2014   Osteomyelitis (HCC)    first and third metatarsal   Pneumonia    hx of walking   Splenic infarct    setting of lupus anticoagulant   Stroke [facility]) 2011   right arm weakness     Allergies  Allergen Reactions   Amoxicillin Nausea And Vomiting    Has patient had a PCN reaction causing immediate rash, facial/tongue/throat swelling, SOB or lightheadedness with hypotension: No Has patient had a PCN reaction causing severe rash involving mucus membranes or skin necrosis: No Has patient had a PCN reaction that required hospitalization: No Has patient had a PCN reaction occurring within the last 10 years: Yes If all of the above answers are "NO", then may proceed with Cephalosporin use.      Current Outpatient Medications  Medication Sig Dispense Refill   aspirin EC 81 MG tablet Take 1 tablet (81 mg total) by mouth daily. 30 tablet 1   atorvastatin (LIPITOR) 80 MG tablet Take 80 mg by mouth every other day.      carvedilol (COREG) 25 MG tablet Take 25 mg by mouth 2 (two) times daily.      clotrimazole-betamethasone (LOTRISONE) cream Apply 1 application topically 2 (two) times daily. (  Patient taking differently: Apply 1 application  topically daily as needed (rash).) 30 g 2   furosemide (LASIX) 80 MG tablet Take 80 mg by mouth daily.     hydrALAZINE (APRESOLINE) 10 MG tablet Take 1 tablet (10 mg total) by mouth 3 (three) times daily. (Patient taking differently: Take 10 mg by mouth 2 (two) times daily.) 120 tablet 1   insulin lispro (HUMALOG) 100 UNIT/ML KwikPen Inject 25 Units into the skin 3 (three) times daily before meals.     losartan (COZAAR) 50 MG tablet TAKE 1 TABLET BY MOUTH  DAILY 90 tablet 0   metFORMIN (GLUCOPHAGE-XR) 500 MG 24 hr tablet Take 500 mg by mouth in the morning and at bedtime.     Multiple Vitamins-Minerals (MENS 50+ MULTIVITAMIN) TABS Take 1 tablet by mouth daily.     nitroGLYCERIN (NITRODUR - DOSED IN MG/24 HR) 0.2 mg/hr patch  APPLY 1 PATCH ONTO SKIN  DAILY 90 patch 3   pentoxifylline (TRENTAL) 400 MG CR tablet Take 1 tablet (400 mg total) by mouth 3 (three) times daily with meals. 90 tablet 11   potassium chloride (K-DUR,KLOR-CON) 10 MEQ tablet Take 1 tablet (10 mEq total) by mouth daily. (Patient taking differently: Take 10 mEq by mouth every other day.) 30 tablet 6   TRESIBA FLEXTOUCH 100 UNIT/ML SOPN FlexTouch Pen Inject 57 Units into the skin at bedtime.  6   No current facility-administered medications for this visit.     Past Surgical History:  Procedure Laterality Date   4th Toe Amputation Left Jan. 2014   4th toe in [place]   AMPUTATION Left 12/08/2012   Procedure: AMPUTATION DIGIT;  Surgeon: Kathryne Hitch, MD;  Location: [facility] OR;  Service: Orthopedics;  Laterality: Left;  3rd toe amputation possible 5th toe amputation   AMPUTATION Left 12/11/2012   Procedure: Amputation of 2nd Toe;  Surgeon: Kathryne Hitch, MD;  Location: [facility] OR;  Service: Orthopedics;  Laterality: Left;   AMPUTATION Right 06/29/2013   Procedure: AMPUTATION BELOW KNEE;  Surgeon: Nadara Mustard, MD;  Location: [facility] OR;  Service: Orthopedics;  Laterality: Right;   AMPUTATION Left 08/29/2014   Procedure: Left Great Toe Amputation at MTP;  Surgeon: Nadara Mustard, MD;  Location: [facility] OR;  Service: Orthopedics;  Laterality: Left;   AMPUTATION Left 10/31/2014   Procedure: Midfoot Amputation;  Surgeon: Nadara Mustard, MD;  Location: [facility] OR;  Service: Orthopedics;  Laterality: Left;   BYPASS GRAFT POPLITEAL TO TIBIAL Left 12/13/2012   Procedure: BYPASS GRAFT POPLITEAL TO TIBIAL;  Surgeon: Nada Libman, MD;  Location: [facility] OR;  Service: Vascular;  Laterality: Left;  Left Popliteal to Posterior Tibial Bypass Graft with reversed saphenous vein graft   CARDIAC CATHETERIZATION     COLONOSCOPY [name] 04/14/2017   Procedure: COLONOSCOPY;  Surgeon: West Bali, MD;  Location: [facility] SUITE;  Service: Endoscopy;  Laterality: [name];  1000   I & D  EXTREMITY Left 12/08/2012   Procedure: IRRIGATION AND DEBRIDEMENT EXTREMITY;  Surgeon: Kathryne Hitch, MD;  Location: [facility] OR;  Service: Orthopedics;  Laterality: Left;   I & D EXTREMITY Left 12/11/2012   Procedure: REPEAT I&D LEFT FOOT;  Surgeon: Kathryne Hitch, MD;  Location: [facility] OR;  Service: Orthopedics;  Laterality: Left;   INCISION AND DRAINAGE Right 06/25/2013   Procedure: INCISION AND DRAINAGE RIGHT FOOT;  Surgeon: Kathryne Hitch, MD;  Location: [facility] ORS;  Service: Orthopedics;  Laterality: Right;   LEFT HEART CATHETERIZATION WITH CORONARY ANGIOGRAM  [name] 06/28/2013   Procedure: LEFT HEART CATHETERIZATION WITH CORONARY ANGIOGRAM;  Surgeon: Kathleene Hazel, MD;  Location: [facility] CATH LAB;  Service: Cardiovascular;  Laterality: [name];   MASS EXCISION [name] 11/15/2017   Procedure: EXCISION 4CM CYST ON NECK;  Surgeon: Lucretia Roers, MD;  Location: [facility] ORS;  Service: General;  Laterality: [name];   PENILE PROSTHESIS IMPLANT     POLYPECTOMY  04/14/2017   Procedure: POLYPECTOMY;  Surgeon: West Bali, MD;  Location: [facility] SUITE;  Service: Endoscopy;;  colon   STUMP REVISION Right 08/16/2013   Procedure: STUMP REVISION- right Below Knee Amputation;  Surgeon: Nadara Mustard, MD;  Location: [facility] OR;  Service: Orthopedics;  Laterality: Right;   TEE WITHOUT CARDIOVERSION [name] 07/03/2013   Procedure: TRANSESOPHAGEAL ECHOCARDIOGRAM (TEE);  Surgeon: Laurey Morale, MD;  Location: [facility] ENDOSCOPY;  Service: Cardiovascular;  Laterality: [name];   TONSILLECTOMY AND ADENOIDECTOMY       Allergies  Allergen Reactions   Amoxicillin Nausea And Vomiting    Has patient had a PCN reaction causing immediate rash, facial/tongue/throat swelling, SOB or lightheadedness with hypotension: No Has patient had a PCN reaction causing severe rash involving mucus membranes or skin necrosis: No Has patient had a PCN reaction that required hospitalization: No Has patient had a PCN reaction occurring within the last  10 years: Yes If all of the above answers are "NO", then may proceed with Cephalosporin use.       Family History  Problem Relation Age of Onset   Pancreatic cancer Mother    Diabetes Mother    Hyperlipidemia Mother    Breast cancer Sister    Depression Maternal Grandmother    Heart disease Maternal Grandmother    Depression Maternal Grandfather    Heart disease Maternal Grandfather    Depression Paternal Grandmother    Heart disease Paternal Grandmother    Depression Paternal Grandfather    Heart disease Paternal Grandfather    Diabetes Other    Colon cancer Neg Hx      Social History Albert [name] reports that he has never smoked. He has never used smokeless tobacco. Albert [name] reports no history of alcohol use.   Review of Systems CONSTITUTIONAL: No weight loss, fever, chills, weakness or fatigue.  HEENT: Eyes: No visual loss, blurred vision, double vision or yellow sclerae.No hearing loss, sneezing, congestion, runny nose or sore throat.  SKIN: No rash or itching.  CARDIOVASCULAR: per hpi RESPIRATORY: No shortness of breath, cough or sputum.  GASTROINTESTINAL: No anorexia, nausea, vomiting or diarrhea. No abdominal pain or blood.  GENITOURINARY: No burning on urination, no polyuria NEUROLOGICAL: No headache, dizziness, syncope, paralysis, ataxia, numbness or tingling in the extremities. No change in bowel or bladder control.  MUSCULOSKELETAL: No muscle, back pain, joint pain or stiffness.  LYMPHATICS: No enlarged nodes. No history of splenectomy.  PSYCHIATRIC: No history of depression or anxiety.  ENDOCRINOLOGIC: No reports of sweating, cold or heat intolerance. No polyuria or polydipsia.  Marland Kitchen   Physical Examination Today's Vitals   03/22/23 0909  BP: 102/60  Pulse: 71  SpO2: 98%  Weight: 259 lb 6.4 oz (117.7 kg)  Height: 5\' 11"  (1.803 m)   Body mass index is 36.18 kg/m.  Gen: resting comfortably, no acute distress HEENT: no scleral icterus, pupils equal round  and reactive, no palptable cervical adenopathy,  CV: RRR, no m/rg, no jvd Resp: Clear to auscultation bilaterally GI: abdomen is soft, non-tender, non-distended, normal bowel sounds, no hepatosplenomegaly MSK: extremities are warm,  no edema.  Skin: warm, no rash Neuro:  no focal deficits Psych: appropriate affect   Diagnostic Studies 06/2013 Cath Hemodynamic Findings: Central aortic pressure: 90/61 Left ventricular pressure: 90/19/29   Angiographic Findings:   Left main: Short segment without obstruction.     Left Anterior Descending Artery: Moderate caliber diffusely diseased vessel that courses to the apex. The proximal vessel has diffuse 40% stenosis. The mid vessel has a 70% stenosis followed by a 60% stenosis. The distal vessel has diffuse 90% stenosis. The diagonal is a moderate caliber vessel with ostial 99% stenosis with 99% disease extending into the mid vessel.     Circumflex Artery: Large caliber vessel with proximal 80-90% stenosis. There are three small to moderate caliber obtuse marginal branches. The first OM has mid 80% stenosis. The second OM has a proximal 99% stenosis. The third OM has diffuse 99% stenosis. The left sided posterolateral Cerissa Zeiger has 99% stenosis.     Right Coronary Artery: Small non-dominant vessel with mid 99% stenosis.    Left Ventricular Angiogram: LVEF=40%   Impression: 1. Triple vessel CAD 2. NSTEMI secondary to demand ischemia from sepsis with severe underlying CAD 3. Mild LV systolic dysfunction   Recommendations: Complex situation. The only option for coronary revascularization is CABG. He is not a favorable candidate for CABG at this time with necrotic right foot, sepsis. Even though not ideal, would favor medical management of CAD while undergoing treatment of his necrotic foot. Would proceed with right BKA this weekend. I have discussed the case with CT surgery who agrees that CABG is not a good option at this time. Will ask CT surgery to  see after he recovers from his BKA. Continue ASA, beta blocker, statin.          Complications:  None. The patient tolerated the procedure well.                11/2012 Carotid US Summary:  - No significant extracranial carotid artery stenosis   demonstrated. Vertebrals are patent with antegrade flow. - ICA/CCA ratio= right = 0.94. left = 0.73. Other specific details can be found in the table(s) above.    Prepared and Electronically Authenticated by   03/2015 Carotid US IMPRESSION: Mild atherosclerotic disease in the carotid arteries, right side greater the left. The peak systolic velocity in the proximal right internal carotid artery is slightly elevated. Estimated degree of stenosis in the right internal carotid artery is close to 50%.   Estimated degree of stenosis in the left internal carotid artery is less than 50%.   Patent vertebral arteries.     12/07/15 Clinic EKG (performed and reviewed in clinic): NSR   01/2017 echo   Study Conclusions   - Left ventricle: The cavity size was normal. Wall thickness was at   the upper limits of normal. The estimated ejection fraction was   50%. Doppler parameters are consistent with abnormal left   ventricular relaxation (grade 1 diastolic dysfunction). - Aortic valve: Mildly calcified annulus. Trileaflet. - Mitral valve: Calcified annulus. Mildly thickened leaflets .   There was trivial regurgitation. - Tricuspid valve: There was trivial regurgitation. Peak RV-RA   gradient (S): 21 mm Hg. - Pulmonary arteries: Systolic pressure could not be accurately   estimated. - Pericardium, extracardiac: A trivial pericardial effusion was   identified.   Impressions:   - Overall limited images. Upper normal LV wall thickness with LVEF   approximately 50% based on best views. Cannot exclude periapical   wall motion  abnormalities. Would suggest a limited study with   Definity contrast. Calcified mitral annulus with mildly thickened    leaflets and trivial mitral regurgitation. Trivial tricuspid   regurgitation with RV-RA gradient 21 mmHg. Trivial pericardial   effusion.     02/2022 carotid US Summary:  Right Carotid: Velocities in the right ICA are consistent with a 1-39%  stenosis.   Left Carotid: Velocities in the left ICA are consistent with a 1-39%  stenosis.   Vertebrals:  Bilateral vertebral arteries demonstrate antegrade flow.  Subclavians: Normal flow hemodynamics were seen in bilateral subclavian               arteries.       Assessment and Plan   1. Chronic systolic heart failure - LVEF has normalized based on most recent echo - denies any recent symptmos, conitnue current meds   2. CAD - no recent symptoms, cotinue current meds     3. Hyperlipidemia - LDL remains at goal, continue atorvastatin     4. HTN - bp is at goal, continue current meds   5. Carotid stenosis -mild bilateral disease, continue to monitor at this time. Can repeat US next year   F/u 6 months     Antoine Poche, M.D.

## 2023-04-05 NOTE — Progress Notes (Unsigned)
GI Office Note    Referring Provider: Benita Stabile, MD Primary Care Physician:  Benita Stabile, MD Primary Gastroenterologist: Hennie Duos. Marletta Lor, DO  Date:  04/06/2023  ID:  Albert [name], DOB 1960-12-22, MRN [id number]   Chief Complaint   Chief Complaint  Patient presents with   Colonoscopy   History of Present Illness  Albert [name] is a 62 y.o. male with a history of anemia, CAD, CVA in 2011, diabetes, GERD HLD, HTN, MI 2014 presenting today for follow-up to reschedule colonoscopy.  Last colonoscopy June 2018:  -4 mm polyp in the mid ascending colon. -Rectosigmoid, sigmoid, transverse colon moderately redundant.   -External and internal hemorrhoids found on retroflexion -Pathology revealing tubular adenoma.  -Advised repeat colonoscopy in 5-10 years and follow high-fiber diet.   Seen in office 07/04/22 for evaluation prior to scheduling colonoscopy. No change in bowel habits, melena, brbpr, abdominal pain, weight loss, lack of appetite, early satiety. BG under good control.Occasional peripheral edema treated with lasix. Recently stopped PPI . No dysphagia.  Scheduled for colonoscopy with Dr. Marletta Lor  Patient difficulties with transportation with getting someone to take him to his with his procedure in October and stated he would call back in 2024 when he was ready to reschedule.  Most recently seen by cardiology, Dr. Wyline Mood on 03/22/2023.  Patient with no complaints of shortness of breath or dyspnea on exertion.  Also with no edema and has been compliant with medications. Advised repeat US next year to monitor carotid stenosis. Echo in 2018 with almost normalized EF.   Today:  Denies any melena, BRBPR, constipation, diarrhea, abdominal pain, lack of appetite, early satiety, nausea, vomiting, dysphagia, unintentional weight loss, shortness of breath, chest pain, lower extremity edema, syncope, or falls.   BM daily and is soft. No straining.  Takes lasix once daily. No urination  issues. No vision changes. Went to eye doctor in May and vision is stable.   Current Outpatient Medications  Medication Sig Dispense Refill   aspirin EC 81 MG tablet Take 1 tablet (81 mg total) by mouth daily. 30 tablet 1   atorvastatin (LIPITOR) 80 MG tablet Take 80 mg by mouth every other day.      carvedilol (COREG) 25 MG tablet Take 25 mg by mouth 2 (two) times daily.      clotrimazole-betamethasone (LOTRISONE) cream Apply 1 application topically 2 (two) times daily. (Patient taking differently: Apply 1 application  topically daily as needed (rash).) 30 g 2   furosemide (LASIX) 80 MG tablet Take 80 mg by mouth daily.     gabapentin (NEURONTIN) 300 MG capsule Take 1 capsule 3 times a day by oral route as needed.     hydrALAZINE (APRESOLINE) 10 MG tablet Take 1 tablet by mouth 2 (two) times daily.     insulin lispro (HUMALOG) 100 UNIT/ML KwikPen Inject 25 Units into the skin 3 (three) times daily before meals.     losartan (COZAAR) 50 MG tablet TAKE 1 TABLET BY MOUTH  DAILY 90 tablet 0   metFORMIN (GLUCOPHAGE-XR) 500 MG 24 hr tablet Take 500 mg by mouth in the morning and at bedtime.     Multiple Vitamins-Minerals (MENS 50+ MULTIVITAMIN) TABS Take 1 tablet by mouth daily.     nitroGLYCERIN (NITRODUR - DOSED IN MG/24 HR) 0.2 mg/hr patch APPLY 1 PATCH ONTO SKIN  DAILY 90 patch 3   pentoxifylline (TRENTAL) 400 MG CR tablet Take 1 tablet (400 mg total) by mouth 3 (three) times  daily with meals. 90 tablet 11   potassium chloride (K-DUR,KLOR-CON) 10 MEQ tablet Take 1 tablet (10 mEq total) by mouth daily. 30 tablet 6   TRESIBA FLEXTOUCH 100 UNIT/ML SOPN FlexTouch Pen Inject 57 Units into the skin at bedtime.  6   No current facility-administered medications for this visit.    Past Medical History:  Diagnosis Date   Allergic rhinitis    Anemia    takes supplement   Chronic cough    Chronic sinusitis    Complication of anesthesia    couldn't swallow and talk   Coronary artery disease    CVA  (cerebral infarction) 2011   R hand deficit   Diabetes mellitus without complication (HCC) ~2000   last HbA1c ~9 .... fasting 160s   GERD (gastroesophageal reflux disease)    Heart murmur    Hypercholesteremia    Hypertension    Myocardial infarction (HCC) 2014   Osteomyelitis (HCC)    first and third metatarsal   Pneumonia    hx of walking   Splenic infarct    setting of lupus anticoagulant   Stroke [facility]) 2011   right arm weakness    Past Surgical History:  Procedure Laterality Date   4th Toe Amputation Left Jan. 2014   4th toe in [place]   AMPUTATION Left 12/08/2012   Procedure: AMPUTATION DIGIT;  Surgeon: Kathryne Hitch, MD;  Location: [facility] OR;  Service: Orthopedics;  Laterality: Left;  3rd toe amputation possible 5th toe amputation   AMPUTATION Left 12/11/2012   Procedure: Amputation of 2nd Toe;  Surgeon: Kathryne Hitch, MD;  Location: [facility] OR;  Service: Orthopedics;  Laterality: Left;   AMPUTATION Right 06/29/2013   Procedure: AMPUTATION BELOW KNEE;  Surgeon: Nadara Mustard, MD;  Location: [facility] OR;  Service: Orthopedics;  Laterality: Right;   AMPUTATION Left 08/29/2014   Procedure: Left Great Toe Amputation at MTP;  Surgeon: Nadara Mustard, MD;  Location: [facility] OR;  Service: Orthopedics;  Laterality: Left;   AMPUTATION Left 10/31/2014   Procedure: Midfoot Amputation;  Surgeon: Nadara Mustard, MD;  Location: [facility] OR;  Service: Orthopedics;  Laterality: Left;   BYPASS GRAFT POPLITEAL TO TIBIAL Left 12/13/2012   Procedure: BYPASS GRAFT POPLITEAL TO TIBIAL;  Surgeon: Nada Libman, MD;  Location: [facility] OR;  Service: Vascular;  Laterality: Left;  Left Popliteal to Posterior Tibial Bypass Graft with reversed saphenous vein graft   CARDIAC CATHETERIZATION     COLONOSCOPY [name] 04/14/2017   Procedure: COLONOSCOPY;  Surgeon: West Bali, MD;  Location: [facility] ENDO SUITE;  Service: Endoscopy;  Laterality: [name];  1000   I & D EXTREMITY Left 12/08/2012   Procedure: IRRIGATION AND DEBRIDEMENT  EXTREMITY;  Surgeon: Kathryne Hitch, MD;  Location: [facility] OR;  Service: Orthopedics;  Laterality: Left;   I & D EXTREMITY Left 12/11/2012   Procedure: REPEAT I&D LEFT FOOT;  Surgeon: Kathryne Hitch, MD;  Location: [facility] OR;  Service: Orthopedics;  Laterality: Left;   INCISION AND DRAINAGE Right 06/25/2013   Procedure: INCISION AND DRAINAGE RIGHT FOOT;  Surgeon: Kathryne Hitch, MD;  Location: [facility] ORS;  Service: Orthopedics;  Laterality: Right;   LEFT HEART CATHETERIZATION WITH CORONARY ANGIOGRAM [name] 06/28/2013   Procedure: LEFT HEART CATHETERIZATION WITH CORONARY ANGIOGRAM;  Surgeon: Kathleene Hazel, MD;  Location: [facility] CATH LAB;  Service: Cardiovascular;  Laterality: [name];   MASS EXCISION [name] 11/15/2017   Procedure: EXCISION 4CM CYST ON NECK;  Surgeon: Lucretia Roers, MD;  Location:  [facility] ORS;  Service: General;  Laterality: [name];   PENILE PROSTHESIS IMPLANT     POLYPECTOMY  04/14/2017   Procedure: POLYPECTOMY;  Surgeon: West Bali, MD;  Location: [facility] ENDO SUITE;  Service: Endoscopy;;  colon   STUMP REVISION Right 08/16/2013   Procedure: STUMP REVISION- right Below Knee Amputation;  Surgeon: Nadara Mustard, MD;  Location: [facility] OR;  Service: Orthopedics;  Laterality: Right;   TEE WITHOUT CARDIOVERSION [name] 07/03/2013   Procedure: TRANSESOPHAGEAL ECHOCARDIOGRAM (TEE);  Surgeon: Laurey Morale, MD;  Location: [facility] ENDOSCOPY;  Service: Cardiovascular;  Laterality: [name];   TONSILLECTOMY AND ADENOIDECTOMY      Family History  Problem Relation Age of Onset   Pancreatic cancer Mother    Diabetes Mother    Hyperlipidemia Mother    Breast cancer Sister    Depression Maternal Grandmother    Heart disease Maternal Grandmother    Depression Maternal Grandfather    Heart disease Maternal Grandfather    Depression Paternal Grandmother    Heart disease Paternal Grandmother    Depression Paternal Grandfather    Heart disease Paternal Grandfather    Diabetes Other    Colon cancer Neg Hx      Allergies as of 04/06/2023 - Review Complete 04/06/2023  Allergen Reaction Noted   Amoxicillin Nausea And Vomiting 12/07/2012    Social History   Socioeconomic History   Marital status: Divorced    Spouse name: Not on file   Number of children: 2   Years of education: Not on file   Highest education level: Not on file  Occupational History   Occupation: IT consultant labor    Employer: [employer]    Comment: Disabled  Tobacco Use   Smoking status: Never   Smokeless tobacco: Never  Vaping Use   Vaping Use: Never used  Substance and Sexual Activity   Alcohol use: No    Alcohol/week: 0.0 standard drinks of alcohol   Drug use: No   Sexual activity: Not Currently  Other Topics Concern   Not on file  Social History Narrative   Lives in [place]   Wife: Primary school teacher   Social Determinants of Health   Financial Resource Strain: Not on file  Food Insecurity: Not on file  Transportation Needs: Not on file  Physical Activity: Not on file  Stress: Not on file  Social Connections: Not on file     Review of Systems   Gen: Denies fever, chills, anorexia. Denies fatigue, weakness, weight loss.  CV: Denies chest pain, palpitations, syncope, peripheral edema, and claudication. Resp: Denies dyspnea at rest, cough, wheezing, coughing up blood, and pleurisy. GI: See HPI Derm: Denies rash, itching, dry skin Psych: Denies depression, anxiety, memory loss, confusion. No homicidal or suicidal ideation.  Heme: Denies bruising, bleeding, and enlarged lymph nodes.   Physical Exam   BP 110/73 (BP Location: Left Arm, Patient Position: Sitting, Cuff Size: Large)   Pulse 77   Temp 98 F (36.7 C) (Oral)   Ht 5\' 11"  (1.803 m)   Wt 254 lb 9.6 oz (115.5 kg)   SpO2 98%   BMI 35.51 kg/m   General:   Alert and oriented. No distress noted. Pleasant and cooperative.  Head:  Normocephalic and atraumatic. Eyes:  Conjuctiva clear without scleral icterus. Lungs:  Clear to  auscultation bilaterally. No wheezes, rales, or rhonchi. No distress.  Heart:  S1, S2 present without murmurs appreciated.  Abdomen:  +BS, soft, non-tender and non-distended. No rebound or guarding. No HSM or  masses noted. Rectal: deferred Msk:  Normal posture. Weak gait. Uses single point cane.  Extremities:  Without edema. Neurologic:  Alert and  oriented x4 Psych:  Alert and cooperative. Normal mood and affect.   Assessment  Albert [name] is a 62 y.o. male with a history of CAD, CVA in 2011, GERD, HLD, HTN, MI in 2014, and diabetes presenting today to discuss rescheduling colonoscopy.   History of colon polyps: Last colonoscopy in June 2018 with redundant colon and single tubular adenoma.  Advised repeat in 5-10 years.  No alarm symptoms present.  Denies any upper or lower GI symptoms at this time.  Follows regularly with cardiology and was last seen 03/22/2023 and has stable EF.  Patient agreeable to proceed with surveillance colonoscopy given his last one was rescheduled as his ride's vehicle was having issues the morning of his procedure.  He is can work on finding a new ride and have his son stay with him for his procedure.  PLAN   Proceed with colonoscopy with propofol by Dr. Marletta Lor  in near future: the risks, benefits, and alternatives have been discussed with the patient in detail. The patient states understanding and desires to proceed. ASA 3 (prefers a Monday or Tuesday first AM appointment), will get son to be there with him for procedure.  Hold metformin the night prior to and the morning of procedure Monitor blood sugars every 4-6 hours while taking prep and on clear liquids.  Take half your normal dose of Tresiba the night prior to your procedure Reinforced that he needs to have chaperone with him the day of the procedure.    Brooke Bonito, MSN, FNP-BC, AGACNP-BC [facility]

## 2023-04-05 NOTE — H&P (View-Only) (Signed)
GI Office Note    Referring Provider: Benita Stabile, MD Primary Care Physician:  Benita Stabile, MD Primary Gastroenterologist: Hennie Duos. Marletta Lor, DO  Date:  04/06/2023  ID:  Albert [name], DOB 1960-12-22, MRN [id number]   Chief Complaint   Chief Complaint  Patient presents with   Colonoscopy   History of Present Illness  Albert [name] is a 62 y.o. male with a history of anemia, CAD, CVA in 2011, diabetes, GERD HLD, HTN, MI 2014 presenting today for follow-up to reschedule colonoscopy.  Last colonoscopy June 2018:  -4 mm polyp in the mid ascending colon. -Rectosigmoid, sigmoid, transverse colon moderately redundant.   -External and internal hemorrhoids found on retroflexion -Pathology revealing tubular adenoma.  -Advised repeat colonoscopy in 5-10 years and follow high-fiber diet.   Seen in office 07/04/22 for evaluation prior to scheduling colonoscopy. No change in bowel habits, melena, brbpr, abdominal pain, weight loss, lack of appetite, early satiety. BG under good control.Occasional peripheral edema treated with lasix. Recently stopped PPI . No dysphagia.  Scheduled for colonoscopy with Dr. Marletta Lor  Patient difficulties with transportation with getting someone to take him to his with his procedure in October and stated he would call back in 2024 when he was ready to reschedule.  Most recently seen by cardiology, Dr. Wyline Mood on 03/22/2023.  Patient with no complaints of shortness of breath or dyspnea on exertion.  Also with no edema and has been compliant with medications. Advised repeat US next year to monitor carotid stenosis. Echo in 2018 with almost normalized EF.   Today:  Denies any melena, BRBPR, constipation, diarrhea, abdominal pain, lack of appetite, early satiety, nausea, vomiting, dysphagia, unintentional weight loss, shortness of breath, chest pain, lower extremity edema, syncope, or falls.   BM daily and is soft. No straining.  Takes lasix once daily. No urination  issues. No vision changes. Went to eye doctor in May and vision is stable.   Current Outpatient Medications  Medication Sig Dispense Refill   aspirin EC 81 MG tablet Take 1 tablet (81 mg total) by mouth daily. 30 tablet 1   atorvastatin (LIPITOR) 80 MG tablet Take 80 mg by mouth every other day.      carvedilol (COREG) 25 MG tablet Take 25 mg by mouth 2 (two) times daily.      clotrimazole-betamethasone (LOTRISONE) cream Apply 1 application topically 2 (two) times daily. (Patient taking differently: Apply 1 application  topically daily as needed (rash).) 30 g 2   furosemide (LASIX) 80 MG tablet Take 80 mg by mouth daily.     gabapentin (NEURONTIN) 300 MG capsule Take 1 capsule 3 times a day by oral route as needed.     hydrALAZINE (APRESOLINE) 10 MG tablet Take 1 tablet by mouth 2 (two) times daily.     insulin lispro (HUMALOG) 100 UNIT/ML KwikPen Inject 25 Units into the skin 3 (three) times daily before meals.     losartan (COZAAR) 50 MG tablet TAKE 1 TABLET BY MOUTH  DAILY 90 tablet 0   metFORMIN (GLUCOPHAGE-XR) 500 MG 24 hr tablet Take 500 mg by mouth in the morning and at bedtime.     Multiple Vitamins-Minerals (MENS 50+ MULTIVITAMIN) TABS Take 1 tablet by mouth daily.     nitroGLYCERIN (NITRODUR - DOSED IN MG/24 HR) 0.2 mg/hr patch APPLY 1 PATCH ONTO SKIN  DAILY 90 patch 3   pentoxifylline (TRENTAL) 400 MG CR tablet Take 1 tablet (400 mg total) by mouth 3 (three) times  daily with meals. 90 tablet 11   potassium chloride (K-DUR,KLOR-CON) 10 MEQ tablet Take 1 tablet (10 mEq total) by mouth daily. 30 tablet 6   TRESIBA FLEXTOUCH 100 UNIT/ML SOPN FlexTouch Pen Inject 57 Units into the skin at bedtime.  6   No current facility-administered medications for this visit.    Past Medical History:  Diagnosis Date   Allergic rhinitis    Anemia    takes supplement   Chronic cough    Chronic sinusitis    Complication of anesthesia    couldn't swallow and talk   Coronary artery disease    CVA  (cerebral infarction) 2011   R hand deficit   Diabetes mellitus without complication (HCC) ~2000   last HbA1c ~9 .... fasting 160s   GERD (gastroesophageal reflux disease)    Heart murmur    Hypercholesteremia    Hypertension    Myocardial infarction (HCC) 2014   Osteomyelitis (HCC)    first and third metatarsal   Pneumonia    hx of walking   Splenic infarct    setting of lupus anticoagulant   Stroke [facility]) 2011   right arm weakness    Past Surgical History:  Procedure Laterality Date   4th Toe Amputation Left Jan. 2014   4th toe in [place]   AMPUTATION Left 12/08/2012   Procedure: AMPUTATION DIGIT;  Surgeon: Kathryne Hitch, MD;  Location: [facility] OR;  Service: Orthopedics;  Laterality: Left;  3rd toe amputation possible 5th toe amputation   AMPUTATION Left 12/11/2012   Procedure: Amputation of 2nd Toe;  Surgeon: Kathryne Hitch, MD;  Location: [facility] OR;  Service: Orthopedics;  Laterality: Left;   AMPUTATION Right 06/29/2013   Procedure: AMPUTATION BELOW KNEE;  Surgeon: Nadara Mustard, MD;  Location: [facility] OR;  Service: Orthopedics;  Laterality: Right;   AMPUTATION Left 08/29/2014   Procedure: Left Great Toe Amputation at MTP;  Surgeon: Nadara Mustard, MD;  Location: [facility] OR;  Service: Orthopedics;  Laterality: Left;   AMPUTATION Left 10/31/2014   Procedure: Midfoot Amputation;  Surgeon: Nadara Mustard, MD;  Location: [facility] OR;  Service: Orthopedics;  Laterality: Left;   BYPASS GRAFT POPLITEAL TO TIBIAL Left 12/13/2012   Procedure: BYPASS GRAFT POPLITEAL TO TIBIAL;  Surgeon: Nada Libman, MD;  Location: [facility] OR;  Service: Vascular;  Laterality: Left;  Left Popliteal to Posterior Tibial Bypass Graft with reversed saphenous vein graft   CARDIAC CATHETERIZATION     COLONOSCOPY [name] 04/14/2017   Procedure: COLONOSCOPY;  Surgeon: West Bali, MD;  Location: [facility] SUITE;  Service: Endoscopy;  Laterality: [name];  1000   I & D EXTREMITY Left 12/08/2012   Procedure: IRRIGATION AND DEBRIDEMENT  EXTREMITY;  Surgeon: Kathryne Hitch, MD;  Location: [facility] OR;  Service: Orthopedics;  Laterality: Left;   I & D EXTREMITY Left 12/11/2012   Procedure: REPEAT I&D LEFT FOOT;  Surgeon: Kathryne Hitch, MD;  Location: [facility] OR;  Service: Orthopedics;  Laterality: Left;   INCISION AND DRAINAGE Right 06/25/2013   Procedure: INCISION AND DRAINAGE RIGHT FOOT;  Surgeon: Kathryne Hitch, MD;  Location: [facility] ORS;  Service: Orthopedics;  Laterality: Right;   LEFT HEART CATHETERIZATION WITH CORONARY ANGIOGRAM [name] 06/28/2013   Procedure: LEFT HEART CATHETERIZATION WITH CORONARY ANGIOGRAM;  Surgeon: Kathleene Hazel, MD;  Location: [facility] CATH LAB;  Service: Cardiovascular;  Laterality: [name];   MASS EXCISION [name] 11/15/2017   Procedure: EXCISION 4CM CYST ON NECK;  Surgeon: Lucretia Roers, MD;  Location:  [facility] ORS;  Service: General;  Laterality: [name];   PENILE PROSTHESIS IMPLANT     POLYPECTOMY  04/14/2017   Procedure: POLYPECTOMY;  Surgeon: West Bali, MD;  Location: [facility] SUITE;  Service: Endoscopy;;  colon   STUMP REVISION Right 08/16/2013   Procedure: STUMP REVISION- right Below Knee Amputation;  Surgeon: Nadara Mustard, MD;  Location: [facility] OR;  Service: Orthopedics;  Laterality: Right;   TEE WITHOUT CARDIOVERSION [name] 07/03/2013   Procedure: TRANSESOPHAGEAL ECHOCARDIOGRAM (TEE);  Surgeon: Laurey Morale, MD;  Location: [facility] ENDOSCOPY;  Service: Cardiovascular;  Laterality: [name];   TONSILLECTOMY AND ADENOIDECTOMY      Family History  Problem Relation Age of Onset   Pancreatic cancer Mother    Diabetes Mother    Hyperlipidemia Mother    Breast cancer Sister    Depression Maternal Grandmother    Heart disease Maternal Grandmother    Depression Maternal Grandfather    Heart disease Maternal Grandfather    Depression Paternal Grandmother    Heart disease Paternal Grandmother    Depression Paternal Grandfather    Heart disease Paternal Grandfather    Diabetes Other    Colon cancer Neg Hx      Allergies as of 04/06/2023 - Review Complete 04/06/2023  Allergen Reaction Noted   Amoxicillin Nausea And Vomiting 12/07/2012    Social History   Socioeconomic History   Marital status: Divorced    Spouse name: Not on file   Number of children: 2   Years of education: Not on file   Highest education level: Not on file  Occupational History   Occupation: IT consultant labor    Employer: [employer]    Comment: Disabled  Tobacco Use   Smoking status: Never   Smokeless tobacco: Never  Vaping Use   Vaping Use: Never used  Substance and Sexual Activity   Alcohol use: No    Alcohol/week: 0.0 standard drinks of alcohol   Drug use: No   Sexual activity: Not Currently  Other Topics Concern   Not on file  Social History Narrative   Lives in [place]   Wife: Primary school teacher   Social Determinants of Health   Financial Resource Strain: Not on file  Food Insecurity: Not on file  Transportation Needs: Not on file  Physical Activity: Not on file  Stress: Not on file  Social Connections: Not on file     Review of Systems   Gen: Denies fever, chills, anorexia. Denies fatigue, weakness, weight loss.  CV: Denies chest pain, palpitations, syncope, peripheral edema, and claudication. Resp: Denies dyspnea at rest, cough, wheezing, coughing up blood, and pleurisy. GI: See HPI Derm: Denies rash, itching, dry skin Psych: Denies depression, anxiety, memory loss, confusion. No homicidal or suicidal ideation.  Heme: Denies bruising, bleeding, and enlarged lymph nodes.   Physical Exam   BP 110/73 (BP Location: Left Arm, Patient Position: Sitting, Cuff Size: Large)   Pulse 77   Temp 98 F (36.7 C) (Oral)   Ht 5\' 11"  (1.803 m)   Wt 254 lb 9.6 oz (115.5 kg)   SpO2 98%   BMI 35.51 kg/m   General:   Alert and oriented. No distress noted. Pleasant and cooperative.  Head:  Normocephalic and atraumatic. Eyes:  Conjuctiva clear without scleral icterus. Lungs:  Clear to  auscultation bilaterally. No wheezes, rales, or rhonchi. No distress.  Heart:  S1, S2 present without murmurs appreciated.  Abdomen:  +BS, soft, non-tender and non-distended. No rebound or guarding. No HSM or  masses noted. Rectal: deferred Msk:  Normal posture. Weak gait. Uses single point cane.  Extremities:  Without edema. Neurologic:  Alert and  oriented x4 Psych:  Alert and cooperative. Normal mood and affect.   Assessment  MITSUO EGLOFF is a 62 y.o. male with a history of CAD, CVA in 2011, GERD, HLD, HTN, MI in 2014, and diabetes presenting today to discuss rescheduling colonoscopy.   History of colon polyps: Last colonoscopy in June 2018 with redundant colon and single tubular adenoma.  Advised repeat in 5-10 years.  No alarm symptoms present.  Denies any upper or lower GI symptoms at this time.  Follows regularly with cardiology and was last seen 03/22/2023 and has stable EF.  Patient agreeable to proceed with surveillance colonoscopy given his last one was rescheduled as his ride's vehicle was having issues the morning of his procedure.  He is can work on finding a new ride and have his son stay with him for his procedure.  PLAN   Proceed with colonoscopy with propofol by Dr. Marletta Lor  in near future: the risks, benefits, and alternatives have been discussed with the patient in detail. The patient states understanding and desires to proceed. ASA 3 (prefers a Monday or Tuesday first AM appointment), will get son to be there with him for procedure.  Hold metformin the night prior to and the morning of procedure Monitor blood sugars every 4-6 hours while taking prep and on clear liquids.  Take half your normal dose of Tresiba the night prior to your procedure Reinforced that he needs to have chaperone with him the day of the procedure.    Brooke Bonito, MSN, FNP-BC, AGACNP-BC [facility]

## 2023-04-06 ENCOUNTER — Other Ambulatory Visit: Payer: Self-pay | Admitting: *Deleted

## 2023-04-06 ENCOUNTER — Encounter: Payer: Self-pay | Admitting: Gastroenterology

## 2023-04-06 ENCOUNTER — Encounter: Payer: Self-pay | Admitting: *Deleted

## 2023-04-06 ENCOUNTER — Telehealth: Payer: Self-pay | Admitting: *Deleted

## 2023-04-06 ENCOUNTER — Ambulatory Visit (INDEPENDENT_AMBULATORY_CARE_PROVIDER_SITE_OTHER): Payer: Medicare PPO | Admitting: Gastroenterology

## 2023-04-06 VITALS — BP 110/73 | HR 77 | Temp 98.0°F | Ht 71.0 in | Wt 254.6 lb

## 2023-04-06 DIAGNOSIS — Z86010 Personal history of colonic polyps: Secondary | ICD-10-CM | POA: Diagnosis not present

## 2023-04-06 DIAGNOSIS — Z01818 Encounter for other preprocedural examination: Secondary | ICD-10-CM

## 2023-04-06 MED ORDER — PEG 3350-KCL-NA BICARB-NACL 420 G PO SOLR: 4000.0000 mL | Freq: Once | ORAL | 0 refills | Status: AC

## 2023-04-06 NOTE — Telephone Encounter (Signed)
[employer] PA: Approved Authorization #[id number]  Tracking #[id number] Dates of service 05/01/2023 - 07/31/2023

## 2023-04-06 NOTE — Patient Instructions (Addendum)
It was a pleasure to see you again today!  We are scheduling you for colonoscopy with Dr. Marletta Lor in the near future.  Just this last time he received separate detailed written instructions regarding medication adjustments and when to start your prep.  As we discussed it is just important that you have a chaperone with me the entire time for your procedure, you may use public transportation or a different ride if needed as long as you have a chaperone the entire time.   I want to create trusting relationships with patients. If you receive a survey regarding your visit,  I greatly appreciate you taking time to fill this out on paper or through your MyChart. I value your feedback.  Brooke Bonito, MSN, FNP-BC, AGACNP-BC [facility]

## 2023-04-07 ENCOUNTER — Telehealth: Payer: Self-pay | Admitting: *Deleted

## 2023-04-07 NOTE — Telephone Encounter (Signed)
Pt informed of pre-op date of 04/26/23 at 9 am at [facility].

## 2023-04-25 NOTE — Patient Instructions (Addendum)
Albert [name]  04/25/2023     @PREFPERIOPPHARMACY @   Your procedure is scheduled on  04/21/2023.   Report to [facility] at  0800  A.M.   Call this number if you have problems the morning of surgery:  [phone]  If you experience any cold or flu symptoms such as cough, fever, chills, shortness of breath, etc. between now and your scheduled surgery, please notify us at the above number.   Remember:  Follow the diet and prep instructions given to you by the office.        If you take your insulin at night, Take 1/2 (28.5 units) he night before your procedure.     DO NOT take any medications for diabetes the morning of your procedure.    Take these medicines the morning of surgery with A SIP OF WATER                            carvedilol, gabapentin.     Do not wear jewelry, make-up or nail polish, including gel polish,  artificial nails, or any other type of covering on natural nails (fingers and  toes).  Do not wear lotions, powders, or perfumes, or deodorant.  Do not shave 48 hours prior to surgery.  Men may shave face and neck.  Do not bring valuables to the hospital.  [facility] is not responsible for any belongings or valuables.  Contacts, dentures or bridgework may not be worn into surgery.  Leave your suitcase in the car.  After surgery it may be brought to your room.  For patients admitted to the hospital, discharge time will be determined by your treatment team.  Patients discharged the day of surgery will not be allowed to drive home and must have someone with them for 24 hours.    Special instructions:   DO NOT smoke tobacco or vape for 24 hours before your procedure.  Please read over the following fact sheets that you were given. Anesthesia Post-op Instructions and Care and Recovery After Surgery        Colonoscopy, Adult, Care After The following information offers guidance on how to care for yourself after your procedure. Your health  care provider may also give you more specific instructions. If you have problems or questions, contact your health care provider. What can I expect after the procedure? After the procedure, it is common to have: A small amount of blood in your stool for 24 hours after the procedure. Some gas. Mild cramping or bloating of your abdomen. Follow these instructions at home: Eating and drinking  Drink enough fluid to keep your urine pale yellow. Follow instructions from your health care provider about eating or drinking restrictions. Resume your normal diet as told by your health care provider. Avoid heavy or fried foods that are hard to digest. Activity Rest as told by your health care provider. Avoid sitting for a long time without moving. Get up to take short walks every 1-2 hours. This is important to improve blood flow and breathing. Ask for help if you feel weak or unsteady. Return to your normal activities as told by your health care provider. Ask your health care provider what activities are safe for you. Managing cramping and bloating  Try walking around when you have cramps or feel bloated. If directed, apply heat to your abdomen as told by your health care provider. Use the heat  source that your health care provider recommends, such as a moist heat pack or a heating pad. Place a towel between your skin and the heat source. Leave the heat on for 20-30 minutes. Remove the heat if your skin turns bright red. This is especially important if you are unable to feel pain, heat, or cold. You have a greater risk of getting burned. General instructions If you were given a sedative during the procedure, it can affect you for several hours. Do not drive or operate machinery until your health care provider says that it is safe. For the first 24 hours after the procedure: Do not sign important documents. Do not drink alcohol. Do your regular daily activities at a slower pace than normal. Eat soft  foods that are easy to digest. Take over-the-counter and prescription medicines only as told by your health care provider. Keep all follow-up visits. This is important. Contact a health care provider if: You have blood in your stool 2-3 days after the procedure. Get help right away if: You have more than a small spotting of blood in your stool. You have large blood clots in your stool. You have swelling of your abdomen. You have nausea or vomiting. You have a fever. You have increasing pain in your abdomen that is not relieved with medicine. These symptoms may be an emergency. Get help right away. Call 911. Do not wait to see if the symptoms will go away. Do not drive yourself to the hospital. Summary After the procedure, it is common to have a small amount of blood in your stool. You may also have mild cramping and bloating of your abdomen. If you were given a sedative during the procedure, it can affect you for several hours. Do not drive or operate machinery until your health care provider says that it is safe. Get help right away if you have a lot of blood in your stool, nausea or vomiting, a fever, or increased pain in your abdomen. This information is not intended to replace advice given to you by your health care provider. Make sure you discuss any questions you have with your health care provider. Document Revised: 11/15/2022 Document Reviewed: 05/26/2021 Elsevier Patient Education  2024 [employer] Monitored Anesthesia Care, Care After The following information offers guidance on how to care for yourself after your procedure. Your health care provider may also give you more specific instructions. If you have problems or questions, contact your health care provider. What can I expect after the procedure? After the procedure, it is common to have: Tiredness. Little or no memory about what happened during or after the procedure. Impaired judgment when it comes to making  decisions. Nausea or vomiting. Some trouble with balance. Follow these instructions at home: For the time period you were told by your health care provider:  Rest. Do not participate in activities where you could fall or become injured. Do not drive or use machinery. Do not drink alcohol. Do not take sleeping pills or medicines that cause drowsiness. Do not make important decisions or sign legal documents. Do not take care of children on your own. Medicines Take over-the-counter and prescription medicines only as told by your health care provider. If you were prescribed antibiotics, take them as told by your health care provider. Do not stop using the antibiotic even if you start to feel better. Eating and drinking Follow instructions from your health care provider about what you may eat and drink. Drink enough fluid to keep  your urine pale yellow. If you vomit: Drink clear fluids slowly and in small amounts as you are able. Clear fluids include water, ice chips, low-calorie sports drinks, and fruit juice that has water added to it (diluted fruit juice). Eat light and bland foods in small amounts as you are able. These foods include bananas, applesauce, rice, lean meats, toast, and crackers. General instructions  Have a responsible adult stay with you for the time you are told. It is important to have someone help care for you until you are awake and alert. If you have sleep apnea, surgery and some medicines can increase your risk for breathing problems. Follow instructions from your health care provider about wearing your sleep device: When you are sleeping. This includes during daytime naps. While taking prescription pain medicines, sleeping medicines, or medicines that make you drowsy. Do not use any products that contain nicotine or tobacco. These products include cigarettes, chewing tobacco, and vaping devices, such as e-cigarettes. If you need help quitting, ask your health care  provider. Contact a health care provider if: You feel nauseous or vomit every time you eat or drink. You feel light-headed. You are still sleepy or having trouble with balance after 24 hours. You get a rash. You have a fever. You have redness or swelling around the IV site. Get help right away if: You have trouble breathing. You have new confusion after you get home. These symptoms may be an emergency. Get help right away. Call 911. Do not wait to see if the symptoms will go away. Do not drive yourself to the hospital. This information is not intended to replace advice given to you by your health care provider. Make sure you discuss any questions you have with your health care provider. Document Revised: 02/28/2022 Document Reviewed: 02/28/2022 Elsevier Patient Education  2024 [employer].

## 2023-04-26 ENCOUNTER — Encounter (HOSPITAL_COMMUNITY): Payer: Self-pay

## 2023-04-26 ENCOUNTER — Encounter (HOSPITAL_COMMUNITY)
Admission: RE | Admit: 2023-04-26 | Discharge: 2023-04-26 | Disposition: A | Discharge status: Home / Self Care | Payer: Medicare PPO | Source: Ambulatory Visit | Attending: Internal Medicine | Admitting: Internal Medicine

## 2023-04-26 VITALS — BP 110/73 | HR 77 | Temp 98.0°F | Resp 18 | Ht 71.0 in | Wt 254.6 lb

## 2023-04-26 DIAGNOSIS — Z79899 Other long term (current) drug therapy: Secondary | ICD-10-CM | POA: Diagnosis not present

## 2023-04-26 DIAGNOSIS — D649 Anemia, unspecified: Secondary | ICD-10-CM | POA: Diagnosis not present

## 2023-04-26 DIAGNOSIS — Z01812 Encounter for preprocedural laboratory examination: Secondary | ICD-10-CM | POA: Diagnosis not present

## 2023-04-26 LAB — CBC WITH DIFFERENTIAL/PLATELET
Abs Immature Granulocytes: 0.03 10*3/uL (ref 0.00–0.07)
Basophils Absolute: 0.1 10*3/uL (ref 0.0–0.1)
Basophils Relative: 1 %
Eosinophils Absolute: 0.4 10*3/uL (ref 0.0–0.5)
Eosinophils Relative: 4 %
HCT: 38.7 % — ABNORMAL LOW (ref 39.0–52.0)
Hemoglobin: 12.5 g/dL — ABNORMAL LOW (ref 13.0–17.0)
Immature Granulocytes: 0 %
Lymphocytes Relative: 22 %
Lymphs Abs: 2.2 10*3/uL (ref 0.7–4.0)
MCH: 27.2 pg (ref 26.0–34.0)
MCHC: 32.3 g/dL (ref 30.0–36.0)
MCV: 84.1 fL (ref 80.0–100.0)
Monocytes Absolute: 0.9 10*3/uL (ref 0.1–1.0)
Monocytes Relative: 9 %
Neutro Abs: 6.7 10*3/uL (ref 1.7–7.7)
Neutrophils Relative %: 64 %
Platelets: 289 10*3/uL (ref 150–400)
RBC: 4.6 MIL/uL (ref 4.22–5.81)
RDW: 13.3 % (ref 11.5–15.5)
WBC: 10.3 10*3/uL (ref 4.0–10.5)
nRBC: 0 % (ref 0.0–0.2)

## 2023-04-26 LAB — BASIC METABOLIC PANEL
Anion gap: 11 (ref 5–15)
BUN: 29 mg/dL — ABNORMAL HIGH (ref 6–20)
CO2: 21 mmol/L — ABNORMAL LOW (ref 22–32)
Calcium: 8.7 mg/dL — ABNORMAL LOW (ref 8.9–10.3)
Chloride: 103 mmol/L (ref 98–111)
Creatinine, Ser: 1.2 mg/dL (ref 0.61–1.24)
GFR, Estimated: >60 mL/min (ref >60)
Glucose, Bld: 172 mg/dL — ABNORMAL HIGH (ref 70–99)
Potassium: 3.8 mmol/L (ref 3.5–5.1)
Sodium: 135 mmol/L (ref 135–145)

## 2023-05-01 ENCOUNTER — Ambulatory Visit (HOSPITAL_COMMUNITY)
Admission: RE | Admit: 2023-05-01 | Discharge: 2023-05-01 | Disposition: A | Discharge status: Home / Self Care | Payer: Medicare PPO | Attending: Internal Medicine | Admitting: Internal Medicine

## 2023-05-01 ENCOUNTER — Encounter (HOSPITAL_COMMUNITY)
Admission: RE | Disposition: A | Discharge status: Home / Self Care | Payer: Self-pay | Source: Home / Self Care | Attending: Internal Medicine

## 2023-05-01 ENCOUNTER — Ambulatory Visit (HOSPITAL_COMMUNITY): Payer: Medicare PPO | Admitting: Anesthesiology

## 2023-05-01 DIAGNOSIS — Z79899 Other long term (current) drug therapy: Secondary | ICD-10-CM | POA: Diagnosis not present

## 2023-05-01 DIAGNOSIS — Z7984 Long term (current) use of oral hypoglycemic drugs: Secondary | ICD-10-CM | POA: Insufficient documentation

## 2023-05-01 DIAGNOSIS — K648 Other hemorrhoids: Secondary | ICD-10-CM

## 2023-05-01 DIAGNOSIS — K219 Gastro-esophageal reflux disease without esophagitis: Secondary | ICD-10-CM | POA: Insufficient documentation

## 2023-05-01 DIAGNOSIS — D122 Benign neoplasm of ascending colon: Secondary | ICD-10-CM

## 2023-05-01 DIAGNOSIS — I110 Hypertensive heart disease with heart failure: Secondary | ICD-10-CM | POA: Diagnosis not present

## 2023-05-01 DIAGNOSIS — Z1211 Encounter for screening for malignant neoplasm of colon: Secondary | ICD-10-CM

## 2023-05-01 DIAGNOSIS — E1151 Type 2 diabetes mellitus with diabetic peripheral angiopathy without gangrene: Secondary | ICD-10-CM | POA: Insufficient documentation

## 2023-05-01 DIAGNOSIS — E7800 Pure hypercholesterolemia, unspecified: Secondary | ICD-10-CM | POA: Diagnosis not present

## 2023-05-01 DIAGNOSIS — I69351 Hemiplegia and hemiparesis following cerebral infarction affecting right dominant side: Secondary | ICD-10-CM | POA: Insufficient documentation

## 2023-05-01 DIAGNOSIS — I509 Heart failure, unspecified: Secondary | ICD-10-CM | POA: Diagnosis not present

## 2023-05-01 DIAGNOSIS — Z89511 Acquired absence of right leg below knee: Secondary | ICD-10-CM | POA: Insufficient documentation

## 2023-05-01 DIAGNOSIS — E785 Hyperlipidemia, unspecified: Secondary | ICD-10-CM | POA: Diagnosis not present

## 2023-05-01 DIAGNOSIS — I2510 Atherosclerotic heart disease of native coronary artery without angina pectoris: Secondary | ICD-10-CM

## 2023-05-01 DIAGNOSIS — D126 Benign neoplasm of colon, unspecified: Secondary | ICD-10-CM

## 2023-05-01 DIAGNOSIS — Z794 Long term (current) use of insulin: Secondary | ICD-10-CM | POA: Insufficient documentation

## 2023-05-01 DIAGNOSIS — Z86010 Personal history of colonic polyps: Secondary | ICD-10-CM | POA: Insufficient documentation

## 2023-05-01 DIAGNOSIS — Z7982 Long term (current) use of aspirin: Secondary | ICD-10-CM | POA: Diagnosis not present

## 2023-05-01 DIAGNOSIS — I252 Old myocardial infarction: Secondary | ICD-10-CM | POA: Insufficient documentation

## 2023-05-01 DIAGNOSIS — K635 Polyp of colon: Secondary | ICD-10-CM | POA: Diagnosis not present

## 2023-05-01 HISTORY — PX: POLYPECTOMY: SHX5525

## 2023-05-01 HISTORY — PX: COLONOSCOPY WITH PROPOFOL: SHX5780

## 2023-05-01 LAB — GLUCOSE, CAPILLARY: Glucose-Capillary: 127 mg/dL — ABNORMAL HIGH (ref 70–99)

## 2023-05-01 SURGERY — COLONOSCOPY WITH PROPOFOL
Anesthesia: General

## 2023-05-01 MED ORDER — PROPOFOL 10 MG/ML IV BOLUS
INTRAVENOUS | Status: DC | PRN
  Administered 2023-05-01: 20 mg via INTRAVENOUS
  Administered 2023-05-01: 60 mg via INTRAVENOUS

## 2023-05-01 MED ORDER — LACTATED RINGERS IV SOLN: INTRAVENOUS | Status: DC

## 2023-05-01 MED ORDER — PROPOFOL 500 MG/50ML IV EMUL
INTRAVENOUS | Status: DC | PRN
  Administered 2023-05-01: 150 ug/kg/min via INTRAVENOUS

## 2023-05-01 NOTE — Discharge Instructions (Addendum)
  Colonoscopy Discharge Instructions  Read the instructions outlined below and refer to this sheet in the next few weeks. These discharge instructions provide you with general information on caring for yourself after you leave the hospital. Your doctor may also give you specific instructions. While your treatment has been planned according to the most current medical practices available, unavoidable complications occasionally occur.   ACTIVITY You may resume your regular activity, but move at a slower pace for the next 24 hours.  Take frequent rest periods for the next 24 hours.  Walking will help get rid of the air and reduce the bloated feeling in your belly (abdomen).  No driving for 24 hours (because of the medicine (anesthesia) used during the test).   Do not sign any important legal documents or operate any machinery for 24 hours (because of the anesthesia used during the test).  NUTRITION Drink plenty of fluids.  You may resume your normal diet as instructed by your doctor.  Begin with a light meal and progress to your normal diet. Heavy or fried foods are harder to digest and may make you feel sick to your stomach (nauseated).  Avoid alcoholic beverages for 24 hours or as instructed.  MEDICATIONS You may resume your normal medications unless your doctor tells you otherwise.  WHAT YOU CAN EXPECT TODAY Some feelings of bloating in the abdomen.  Passage of more gas than usual.  Spotting of blood in your stool or on the toilet paper.  IF YOU HAD POLYPS REMOVED DURING THE COLONOSCOPY: No aspirin products for 7 days or as instructed.  No alcohol for 7 days or as instructed.  Eat a soft diet for the next 24 hours.  FINDING OUT THE RESULTS OF YOUR TEST Not all test results are available during your visit. If your test results are not back during the visit, make an appointment with your caregiver to find out the results. Do not assume everything is normal if you have not heard from your  caregiver or the medical facility. It is important for you to follow up on all of your test results.  SEEK IMMEDIATE MEDICAL ATTENTION IF: You have more than a spotting of blood in your stool.  Your belly is swollen (abdominal distention).  You are nauseated or vomiting.  You have a temperature over 101.  You have abdominal pain or discomfort that is severe or gets worse throughout the day.   Your colonoscopy revealed 1 polyp(s) which I removed successfully. Await pathology results, my office will contact you. I recommend repeating colonoscopy in 5 years for surveillance purposes. Otherwise follow up with GI as needed.    I hope you have a great rest of your week!  Charles K. Carver, D.O. Gastroenterology and Hepatology [facility]  

## 2023-05-01 NOTE — Transfer of Care (Signed)
Immediate Anesthesia Transfer of Care Note  Patient: Albert [name]  Procedure(s) Performed: COLONOSCOPY WITH PROPOFOL POLYPECTOMY  Patient Location: PACU  Anesthesia Type:General  Level of Consciousness: awake, alert , oriented, and patient cooperative  Airway & Oxygen Therapy: Patient Spontanous Breathing  Post-op Assessment: Report given to RN, Post -op Vital signs reviewed and stable, and Patient moving all extremities X 4  Post vital signs: stable  Last Vitals:  Vitals Value Taken Time  BP 100/62 05/01/23 1050  Temp 36.6 C 05/01/23 1050  Pulse 70 05/01/23 1050  Resp 13 05/01/23 1050  SpO2 97 % 05/01/23 1050    Last Pain:  Vitals:   05/01/23 1050  TempSrc: Oral  PainSc: 0-No pain         Complications: No notable events documented.

## 2023-05-01 NOTE — Op Note (Signed)
[facility] Patient Name: Albert [name] Procedure Date: 05/01/2023 10:14 AM MRN: [id number] Date of Birth: 1961/10/17 Attending MD: Hennie Duos. Marletta Lor , [place], [id number] CSN: [id number] Age: 62 Admit Type: Outpatient Procedure:                Colonoscopy Indications:              Surveillance: Personal history of adenomatous                            polyps on last colonoscopy > 5 years ago Providers:                Hennie Duos. Marletta Lor, DO, Angelica Ran, Kristine L.                            Jessee Avers, Technician Referring MD:              Medicines:                See the Anesthesia note for documentation of the                            administered medications Complications:            No immediate complications. Estimated Blood Loss:     Estimated blood loss was minimal. Procedure:                Pre-Anesthesia Assessment:                           - The anesthesia plan was to use monitored                            anesthesia care (MAC).                           After obtaining informed consent, the colonoscope                            was passed under direct vision. Throughout the                            procedure, the patient's blood pressure, pulse, and                            oxygen saturations were monitored continuously. The                            [id number]-HQ190L ([id number]) scope was introduced through                            the anus and advanced to the the cecum, identified                            by appendiceal orifice and ileocecal valve. The                            colonoscopy was  performed without difficulty. The                            patient tolerated the procedure well. The quality                            of the bowel preparation was evaluated using the                            BBPS [facility] Bowel Preparation Scale) with scores                            of: Right Colon = 2 (minor amount of residual                            staining,  small fragments of stool and/or opaque                            liquid, but mucosa seen well), Transverse Colon = 3                            (entire mucosa seen well with no residual staining,                            small fragments of stool or opaque liquid) and Left                            Colon = 3 (entire mucosa seen well with no residual                            staining, small fragments of stool or opaque                            liquid). The total BBPS score equals 8. The quality                            of the bowel preparation was good. Scope In: 10:28:31 AM Scope Out: 10:46:28 AM Scope Withdrawal Time: 0 hours 12 minutes 52 seconds  Total Procedure Duration: 0 hours 17 minutes 57 seconds  Findings:      Non-bleeding internal hemorrhoids were found during endoscopy.      A 5 mm polyp was found in the ascending colon. The polyp was sessile.       The polyp was removed with a cold snare. Resection and retrieval were       complete.      The exam was otherwise without abnormality. Impression:               - Non-bleeding internal hemorrhoids.                           - One 5 mm polyp in the ascending colon, removed  with a cold snare. Resected and retrieved.                           - The examination was otherwise normal. Moderate Sedation:      Per Anesthesia Care Recommendation:           - Patient has a contact number available for                            emergencies. The signs and symptoms of potential                            delayed complications were discussed with the                            patient. Return to normal activities tomorrow.                            Written discharge instructions were provided to the                            patient.                           - Resume previous diet.                           - Continue present medications.                           - Await pathology results.                            - Repeat colonoscopy in 5 years for surveillance.                           - Return to GI clinic PRN. Procedure Code(s):        --- Professional ---                           [phone], Colonoscopy, flexible; with removal of                            tumor(s), polyp(s), or other lesion(s) by snare                            technique Diagnosis Code(s):        --- Professional ---                           Z86.010, Personal history of colonic polyps                           D12.2, Benign neoplasm of ascending colon                           K64.8, Other hemorrhoids CPT copyright  2022 American Medical Association. All rights reserved. The codes documented in this report are preliminary and upon coder review may  be revised to meet current compliance requirements. Hennie Duos. Marletta Lor, DO Hennie Duos. Marletta Lor, DO 05/01/2023 10:52:17 AM This report has been signed electronically. Number of Addenda: 0

## 2023-05-01 NOTE — Anesthesia Postprocedure Evaluation (Signed)
Anesthesia Post Note  Patient: Albert [name]  Procedure(s) Performed: COLONOSCOPY WITH PROPOFOL POLYPECTOMY  Patient location during evaluation: Phase II Anesthesia Type: General Level of consciousness: awake and alert and oriented Pain management: pain level controlled Vital Signs Assessment: post-procedure vital signs reviewed and stable Respiratory status: spontaneous breathing, nonlabored ventilation and respiratory function stable Cardiovascular status: blood pressure returned to baseline and stable Postop Assessment: no apparent nausea or vomiting Anesthetic complications: no  No notable events documented.   Last Vitals:  Vitals:   05/01/23 0915 05/01/23 1050  BP:  100/62  Pulse: 73 70  Resp: (!) 8 13  Temp:  36.6 C  SpO2: 100% 97%    Last Pain:  Vitals:   05/01/23 1050  TempSrc: Oral  PainSc: 0-No pain                 Tanveer Dobberstein C Signora Zucco

## 2023-05-01 NOTE — Interval H&P Note (Signed)
History and Physical Interval Note:  05/01/2023 10:14 AM  Albert [name]  has presented today for surgery, with the diagnosis of history of colon polyps.  The various methods of treatment have been discussed with the patient and family. After consideration of risks, benefits and other options for treatment, the patient has consented to  Procedure(s) with comments: COLONOSCOPY WITH PROPOFOL ([name]) - 10:00 am, asa 3 as a surgical intervention.  The patient's history has been reviewed, patient examined, no change in status, stable for surgery.  I have reviewed the patient's chart and labs.  Questions were answered to the patient's satisfaction.     Lanelle Bal

## 2023-05-01 NOTE — Anesthesia Preprocedure Evaluation (Signed)
Anesthesia Evaluation  Patient identified by MRN, date of birth, ID band Patient awake    Reviewed: Allergy & Precautions, H&P , NPO status , Patient's Chart, lab work & pertinent test results, reviewed documented beta blocker date and time   History of Anesthesia Complications (+) history of anesthetic complications (couldn't swallow and talk)  Airway Mallampati: III  TM Distance: >3 FB Neck ROM: Full    Dental  (+) Dental Advisory Given, Missing   Pulmonary pneumonia   Pulmonary exam normal breath sounds clear to auscultation       Cardiovascular hypertension, Pt. on medications and Pt. on home beta blockers + CAD, + Past MI, + Peripheral Vascular Disease and +CHF  Normal cardiovascular exam+ Valvular Problems/Murmurs  Rhythm:Regular Rate:Normal     Neuro/Psych CVA, Residual Symptoms  negative psych ROS   GI/Hepatic Neg liver ROS,GERD  Medicated,,  Endo/Other  diabetes, Well Controlled, Type 2, Oral Hypoglycemic Agents, Insulin Dependent    Renal/GU Renal InsufficiencyRenal disease  negative genitourinary   Musculoskeletal negative musculoskeletal ROS (+)    Abdominal   Peds negative pediatric ROS (+)  Hematology  (+) Blood dyscrasia, anemia   Anesthesia Other Findings   Reproductive/Obstetrics negative OB ROS                             Anesthesia Physical Anesthesia Plan  ASA: 3  Anesthesia Plan: General   Post-op Pain Management: Minimal or no pain anticipated   Induction: Intravenous  PONV Risk Score and Plan: 1 and Treatment may vary due to age or medical condition  Airway Management Planned: Nasal Cannula and Natural Airway  Additional Equipment:   Intra-op Plan:   Post-operative Plan:   Informed Consent: I have reviewed the patients History and Physical, chart, labs and discussed the procedure including the risks, benefits and alternatives for the proposed  anesthesia with the patient or authorized representative who has indicated his/her understanding and acceptance.     Dental advisory given  Plan Discussed with: CRNA and Surgeon  Anesthesia Plan Comments:        Anesthesia Quick Evaluation

## 2023-05-02 LAB — SURGICAL PATHOLOGY

## 2023-05-04 ENCOUNTER — Encounter (HOSPITAL_COMMUNITY): Payer: Self-pay | Admitting: Internal Medicine

## 2023-05-31 DIAGNOSIS — E1369 Other specified diabetes mellitus with other specified complication: Secondary | ICD-10-CM | POA: Diagnosis not present

## 2023-05-31 DIAGNOSIS — Z125 Encounter for screening for malignant neoplasm of prostate: Secondary | ICD-10-CM | POA: Diagnosis not present

## 2023-05-31 DIAGNOSIS — E782 Mixed hyperlipidemia: Secondary | ICD-10-CM | POA: Diagnosis not present

## 2023-06-06 DIAGNOSIS — E1165 Type 2 diabetes mellitus with hyperglycemia: Secondary | ICD-10-CM | POA: Diagnosis not present

## 2023-06-06 DIAGNOSIS — Z89511 Acquired absence of right leg below knee: Secondary | ICD-10-CM | POA: Diagnosis not present

## 2023-06-06 DIAGNOSIS — D649 Anemia, unspecified: Secondary | ICD-10-CM | POA: Diagnosis not present

## 2023-06-06 DIAGNOSIS — I5042 Chronic combined systolic (congestive) and diastolic (congestive) heart failure: Secondary | ICD-10-CM | POA: Diagnosis not present

## 2023-06-06 DIAGNOSIS — E1369 Other specified diabetes mellitus with other specified complication: Secondary | ICD-10-CM | POA: Diagnosis not present

## 2023-06-06 DIAGNOSIS — N182 Chronic kidney disease, stage 2 (mild): Secondary | ICD-10-CM | POA: Diagnosis not present

## 2023-06-06 DIAGNOSIS — I10 Essential (primary) hypertension: Secondary | ICD-10-CM | POA: Diagnosis not present

## 2023-06-06 DIAGNOSIS — I130 Hypertensive heart and chronic kidney disease with heart failure and stage 1 through stage 4 chronic kidney disease, or unspecified chronic kidney disease: Secondary | ICD-10-CM | POA: Diagnosis not present

## 2023-06-06 DIAGNOSIS — E782 Mixed hyperlipidemia: Secondary | ICD-10-CM | POA: Diagnosis not present

## 2023-07-06 ENCOUNTER — Ambulatory Visit (INDEPENDENT_AMBULATORY_CARE_PROVIDER_SITE_OTHER): Payer: Medicare PPO | Admitting: Orthopaedic Surgery

## 2023-07-06 DIAGNOSIS — M79604 Pain in right leg: Secondary | ICD-10-CM | POA: Diagnosis not present

## 2023-07-06 DIAGNOSIS — Z89511 Acquired absence of right leg below knee: Secondary | ICD-10-CM

## 2023-07-06 DIAGNOSIS — L84 Corns and callosities: Secondary | ICD-10-CM

## 2023-07-06 NOTE — Progress Notes (Signed)
Office Visit Note   Patient: Albert [name]           Date of Birth: 11-05-1960           MRN: [id number] Visit Date: 07/06/2023              Requested by: Benita Stabile, MD [address] [place],  [place] [address] PCP: Benita Stabile, MD   Assessment & Plan: Visit Diagnoses:  1. Hx of right BKA (HCC)   2. Foot callus     Plan: Callus debrided left foot wound with small area of bleeding less than a millimeter dressing applied.  We again discussed footcare and diabetes.  He states he went 1 time to look at [facility] for something to trim and debride his callus such as pumice stone or scrapers and states he could not find it.  We discussed he can find it at [facility] or [facility].  Return as needed.  Follow-Up Instructions: No follow-ups on file.   Orders:  No orders of the defined types were placed in this encounter.  No orders of the defined types were placed in this encounter.     Procedures: No procedures performed   Clinical Data: No additional findings.   Subjective: Chief Complaint  Patient presents with   Left Foot - Follow-up    Would like callus shaved on foot    Right Leg - Pain    Has a spot he would like to have checked    HPI 62 year old male returns in not seen him in over a year and a half returns for recurrent callus on his left foot which has had a transmetatarsal amputation but done by Dr. Lajoyce Corners.  He still has calluses why has been trimming but its gotten hard and present over the second metatarsal head or third metatarsal head.  He has a BKA on the opposite side.  He normally keeps wrap and dressing on it before he puts on his sock and shoe.  Review of Systems positive history of heart failure not currently symptomatic.  Positive for diabetes.   Objective: Vital Signs: There were no vitals taken for this visit.  Physical Exam Constitutional:      Appearance: He is well-developed.  HENT:     Head: Normocephalic and atraumatic.     Right Ear:  External ear normal.     Left Ear: External ear normal.  Eyes:     Pupils: Pupils are equal, round, and reactive to light.  Neck:     Thyroid: No thyromegaly.     Trachea: No tracheal deviation.  Cardiovascular:     Rate and Rhythm: Normal rate.  Pulmonary:     Effort: Pulmonary effort is normal.     Breath sounds: No wheezing.  Abdominal:     General: Bowel sounds are normal.     Palpations: Abdomen is soft.  Musculoskeletal:     Cervical back: Neck supple.  Skin:    General: Skin is warm and dry.     Capillary Refill: Capillary refill takes less than 2 seconds.  Neurological:     Mental Status: He is alert and oriented to person, place, and time.  Psychiatric:        Behavior: Behavior normal.        Thought Content: Thought content normal.        Judgment: Judgment normal.     Ortho Exam right BKA left transmit.  Callus formation over the left transmit.  Specialty Comments:  No specialty comments available.  Imaging: No results found.   PMFS History: Patient Active Problem List   Diagnosis Date Noted   Foot callus 10/28/2021   Cellulitis of left lower extremity    Cellulitis in diabetic foot (HCC) 12/29/2018   Sebaceous cyst    Amputated below knee, right (HCC) 10/19/2017   History of transmetatarsal amputation of left foot (HCC) 06/12/2017   Special screening for malignant neoplasms, colon    Midfoot ulcer, left, limited to breakdown of skin (HCC) 11/18/2016   ARF (acute renal failure) (HCC) 02/08/2016   Chronic combined systolic and diastolic CHF (congestive heart failure) (HCC) 02/08/2016   Splenic infarct 01/06/2014   Acute systolic CHF (congestive heart failure) (HCC) 08/03/2013   Hx of right BKA (HCC) 07/26/2013   Anemia 07/15/2013   Hyperkalemia 07/15/2013   Hyposmolality and/or hyponatremia 07/15/2013   Acute respiratory failure (HCC) 07/15/2013   Mitral regurgitation 07/15/2013   Oliguria 07/15/2013   Bacteremia due to Staphylococcus aureus  07/15/2013   Hyperchloremia 07/15/2013   Acute kidney failure (HCC) 07/14/2013   Hypotension, unspecified 07/14/2013   Acute systolic heart failure (HCC) 07/12/2013   Acute pulmonary edema (HCC) 07/11/2013   Pneumonia, organism unspecified(486) 07/11/2013   Hypoxemia 07/11/2013   Cardiomyopathy, ischemic 07/03/2013   Coronary atherosclerosis of native coronary artery 07/01/2013   NSTEMI (non-ST elevated myocardial infarction) (HCC) 06/26/2013   AKI (acute kidney injury) (HCC) 06/25/2013   Shock circulatory (HCC) 06/25/2013   Dehydration with hyponatremia 06/25/2013   Leukocytosis 06/25/2013   Sepsis(995.91) 06/25/2013   Aftercare following surgery of the circulatory system, NEC 02/11/2013   Peripheral vascular disease, unspecified (HCC) 12/31/2012   Diabetes mellitus out of control 12/17/2012   Essential hypertension, benign 12/17/2012   Atherosclerotic peripheral vascular disease (HCC) 12/17/2012   Hypercholesteremia    Past Medical History:  Diagnosis Date   Allergic rhinitis    Anemia    takes supplement   Chronic cough    Chronic sinusitis    Complication of anesthesia    couldn't swallow and talk   Coronary artery disease    CVA (cerebral infarction) 2011   R hand deficit   Diabetes mellitus without complication (HCC) ~2000   last HbA1c ~9 .... fasting 160s   GERD (gastroesophageal reflux disease)    Heart murmur    Hypercholesteremia    Hypertension    Myocardial infarction (HCC) 2014   Osteomyelitis (HCC)    first and third metatarsal   Pneumonia    hx of walking   Splenic infarct    setting of lupus anticoagulant   Stroke [facility]) 2011   right arm weakness    Family History  Problem Relation Age of Onset   Pancreatic cancer Mother    Diabetes Mother    Hyperlipidemia Mother    Breast cancer Sister    Depression Maternal Grandmother    Heart disease Maternal Grandmother    Depression Maternal Grandfather    Heart disease Maternal Grandfather     Depression Paternal Grandmother    Heart disease Paternal Grandmother    Depression Paternal Grandfather    Heart disease Paternal Grandfather    Diabetes Other    Colon cancer Neg Hx     Past Surgical History:  Procedure Laterality Date   4th Toe Amputation Left Jan. 2014   4th toe in [place]   AMPUTATION Left 12/08/2012   Procedure: AMPUTATION DIGIT;  Surgeon: Kathryne Hitch, MD;  Location: [facility] OR;  Service: Orthopedics;  Laterality: Left;  3rd toe amputation possible 5th toe amputation   AMPUTATION Left 12/11/2012   Procedure: Amputation of 2nd Toe;  Surgeon: Kathryne Hitch, MD;  Location: [facility] OR;  Service: Orthopedics;  Laterality: Left;   AMPUTATION Right 06/29/2013   Procedure: AMPUTATION BELOW KNEE;  Surgeon: Nadara Mustard, MD;  Location: [facility] OR;  Service: Orthopedics;  Laterality: Right;   AMPUTATION Left 08/29/2014   Procedure: Left Great Toe Amputation at MTP;  Surgeon: Nadara Mustard, MD;  Location: [facility] OR;  Service: Orthopedics;  Laterality: Left;   AMPUTATION Left 10/31/2014   Procedure: Midfoot Amputation;  Surgeon: Nadara Mustard, MD;  Location: [facility] OR;  Service: Orthopedics;  Laterality: Left;   BYPASS GRAFT POPLITEAL TO TIBIAL Left 12/13/2012   Procedure: BYPASS GRAFT POPLITEAL TO TIBIAL;  Surgeon: Nada Libman, MD;  Location: [facility] OR;  Service: Vascular;  Laterality: Left;  Left Popliteal to Posterior Tibial Bypass Graft with reversed saphenous vein graft   CARDIAC CATHETERIZATION     COLONOSCOPY [name] 04/14/2017   Procedure: COLONOSCOPY;  Surgeon: West Bali, MD;  Location: [facility] SUITE;  Service: Endoscopy;  Laterality: [name];  1000   COLONOSCOPY WITH PROPOFOL [name] 05/01/2023   Procedure: COLONOSCOPY WITH PROPOFOL;  Surgeon: Lanelle Bal, DO;  Location: [facility];  Service: Endoscopy;  Laterality: [name];  10:00 am, asa 3   I & D EXTREMITY Left 12/08/2012   Procedure: IRRIGATION AND DEBRIDEMENT EXTREMITY;  Surgeon: Kathryne Hitch, MD;  Location: [facility] OR;   Service: Orthopedics;  Laterality: Left;   I & D EXTREMITY Left 12/11/2012   Procedure: REPEAT I&D LEFT FOOT;  Surgeon: Kathryne Hitch, MD;  Location: [facility] OR;  Service: Orthopedics;  Laterality: Left;   INCISION AND DRAINAGE Right 06/25/2013   Procedure: INCISION AND DRAINAGE RIGHT FOOT;  Surgeon: Kathryne Hitch, MD;  Location: [facility] ORS;  Service: Orthopedics;  Laterality: Right;   LEFT HEART CATHETERIZATION WITH CORONARY ANGIOGRAM [name] 06/28/2013   Procedure: LEFT HEART CATHETERIZATION WITH CORONARY ANGIOGRAM;  Surgeon: Kathleene Hazel, MD;  Location: [facility] CATH LAB;  Service: Cardiovascular;  Laterality: [name];   MASS EXCISION [name] 11/15/2017   Procedure: EXCISION 4CM CYST ON NECK;  Surgeon: Lucretia Roers, MD;  Location: [facility] ORS;  Service: General;  Laterality: [name];   PENILE PROSTHESIS IMPLANT     POLYPECTOMY  04/14/2017   Procedure: POLYPECTOMY;  Surgeon: West Bali, MD;  Location: [facility];  Service: Endoscopy;;  colon   POLYPECTOMY  05/01/2023   Procedure: POLYPECTOMY;  Surgeon: Lanelle Bal, DO;  Location: [facility];  Service: Endoscopy;;   STUMP REVISION Right 08/16/2013   Procedure: STUMP REVISION- right Below Knee Amputation;  Surgeon: Nadara Mustard, MD;  Location: [facility] OR;  Service: Orthopedics;  Laterality: Right;   TEE WITHOUT CARDIOVERSION [name] 07/03/2013   Procedure: TRANSESOPHAGEAL ECHOCARDIOGRAM (TEE);  Surgeon: Laurey Morale, MD;  Location: [facility] ENDOSCOPY;  Service: Cardiovascular;  Laterality: [name];   TONSILLECTOMY AND ADENOIDECTOMY     Social History   Occupational History   Occupation: IT consultant labor    Employer: [employer]    Comment: Disabled  Tobacco Use   Smoking status: Never   Smokeless tobacco: Never  Vaping Use   Vaping status: Never Used  Substance and Sexual Activity   Alcohol use: No    Alcohol/week: 0.0 standard drinks of alcohol   Drug use: No   Sexual activity: Not Currently

## 2023-09-18 DIAGNOSIS — E1369 Other specified diabetes mellitus with other specified complication: Secondary | ICD-10-CM | POA: Diagnosis not present

## 2023-09-18 DIAGNOSIS — E782 Mixed hyperlipidemia: Secondary | ICD-10-CM | POA: Diagnosis not present

## 2023-09-21 DIAGNOSIS — E1169 Type 2 diabetes mellitus with other specified complication: Secondary | ICD-10-CM | POA: Diagnosis not present

## 2023-09-21 DIAGNOSIS — E785 Hyperlipidemia, unspecified: Secondary | ICD-10-CM | POA: Diagnosis not present

## 2023-09-21 DIAGNOSIS — I10 Essential (primary) hypertension: Secondary | ICD-10-CM | POA: Diagnosis not present

## 2023-09-21 DIAGNOSIS — N182 Chronic kidney disease, stage 2 (mild): Secondary | ICD-10-CM | POA: Diagnosis not present

## 2023-09-21 DIAGNOSIS — Z89511 Acquired absence of right leg below knee: Secondary | ICD-10-CM | POA: Diagnosis not present

## 2023-09-21 DIAGNOSIS — E1369 Other specified diabetes mellitus with other specified complication: Secondary | ICD-10-CM | POA: Diagnosis not present

## 2023-09-21 DIAGNOSIS — Z23 Encounter for immunization: Secondary | ICD-10-CM | POA: Diagnosis not present

## 2023-09-21 DIAGNOSIS — I130 Hypertensive heart and chronic kidney disease with heart failure and stage 1 through stage 4 chronic kidney disease, or unspecified chronic kidney disease: Secondary | ICD-10-CM | POA: Diagnosis not present

## 2023-09-21 DIAGNOSIS — I5042 Chronic combined systolic (congestive) and diastolic (congestive) heart failure: Secondary | ICD-10-CM | POA: Diagnosis not present

## 2023-09-26 ENCOUNTER — Encounter: Payer: Self-pay | Admitting: Cardiology

## 2023-09-26 ENCOUNTER — Ambulatory Visit: Payer: Medicare PPO | Attending: Cardiology | Admitting: Cardiology

## 2023-09-26 VITALS — BP 118/80 | HR 84 | Ht 71.0 in | Wt 268.4 lb

## 2023-09-26 DIAGNOSIS — I10 Essential (primary) hypertension: Secondary | ICD-10-CM

## 2023-09-26 DIAGNOSIS — I2510 Atherosclerotic heart disease of native coronary artery without angina pectoris: Secondary | ICD-10-CM

## 2023-09-26 DIAGNOSIS — E782 Mixed hyperlipidemia: Secondary | ICD-10-CM

## 2023-09-26 DIAGNOSIS — I6523 Occlusion and stenosis of bilateral carotid arteries: Secondary | ICD-10-CM

## 2023-09-26 DIAGNOSIS — I5032 Chronic diastolic (congestive) heart failure: Secondary | ICD-10-CM | POA: Diagnosis not present

## 2023-09-26 NOTE — Progress Notes (Signed)
Clinical Summary Albert [name] is a 62 y.o.male seen today for follow up of the following medical problems.       1. Chronic combined systolic/diastolic heart failure - ICM, LVEF 40-45% by echo 12/2013, grade II diastolic dysfunction - 01/2017 LVEF 50%, grade I diastolic dysfunction     - no recent SOB/DOE - compliant with meds   2. CAD - hx of demand ischemia 06/2013 in setting of sepsis with necrotic foot wound. - cath at that time 06/2013 LM patent, LAD 40% prox, 70% mid follow by 60% lesion, distal 90%. Diag 99%. LCX 80-90% prox, OM1 80%, OM2 99%, OM3 99 % (all OMs are small), RCA small non-dom mid 99%. LVEF 40% by LVgram.   - CAD was managed medically after discussions with CT surgery at that time, with plans to reevaluate once recurring infection issues resolved.     - denies any chest pains   4. PAD - followed by vascular and [facility] - previous right BKA, previous multiple toe amputations. 08/30/14 treated for osteo and ulcer of left great toe with amputation. Jan 2016 had left midfoot amputation.   - Followed by ortho Dr Lajoyce Corners.        5. Hyperlipidemia - he is on atorvastatin 80mg  daily.  01/2023 TC 83 TG 100 HDL 28 LDL 36 - 09/2023 TC 104 TG 129 HDL 27 LDL 54 - compliant with meds   6. HTN - compliant with meds   7. 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 fasting 160s   GERD (gastroesophageal reflux  disease)    Heart murmur    Hypercholesteremia    Hypertension    Myocardial infarction (HCC) 2014   Osteomyelitis (HCC)    first and third metatarsal   Pneumonia    hx of walking   Splenic infarct    setting of lupus anticoagulant   Stroke Albert [name]) 2011   right arm weakness     Allergies  Allergen Reactions   Amoxicillin Nausea And Vomiting          Current Outpatient Medications  Medication Sig Dispense Refill   aspirin EC 81 MG tablet Take 1 tablet (81 mg total) by mouth daily. 30 tablet 1   atorvastatin (LIPITOR) 80 MG tablet Take 80 mg by mouth every other day.      carvedilol (COREG) 25 MG tablet Take 25 mg by mouth 2 (two) times daily.      clotrimazole-betamethasone (LOTRISONE) cream Apply 1 application topically 2 (two) times daily. (Patient taking differently: Apply 1 application  topically daily as needed (rash).) 30 g 2   furosemide (LASIX) 80 MG tablet Take 80 mg by mouth daily.     gabapentin (NEURONTIN) 300 MG capsule Take 300 mg by mouth 3 (three) times daily as needed (pain). Take 1 capsule 3 times a day by oral  route as needed.     hydrALAZINE (APRESOLINE) 10 MG tablet Take 1 tablet by mouth 2 (two) times daily.     insulin lispro (HUMALOG) 100 UNIT/ML KwikPen Inject 25 Units into the skin 3 (three) times daily before meals.     losartan (COZAAR) 50 MG tablet TAKE 1 TABLET BY MOUTH  DAILY 90 tablet 0   metFORMIN (GLUCOPHAGE-XR) 500 MG 24 hr tablet Take 500 mg by mouth in the morning and at bedtime.     Multiple Vitamins-Minerals (MENS 50+ MULTIVITAMIN) TABS Take 1 tablet by mouth daily.     nitroGLYCERIN (NITRODUR - DOSED IN MG/24 HR) 0.2 mg/hr patch APPLY 1 PATCH ONTO SKIN  DAILY (Patient taking differently: Place 0.2 mg onto the skin daily as needed (promote blood flow). Apply to left foot) 90 patch 3   pentoxifylline (TRENTAL) 400 MG CR tablet Take 1 tablet (400 mg total) by mouth 3 (three) times daily with meals. (Patient taking differently: Take 400 mg by  mouth 2 (two) times daily.) 90 tablet 11   potassium chloride (K-DUR,KLOR-CON) 10 MEQ tablet Take 1 tablet (10 mEq total) by mouth daily. (Patient taking differently: Take 10 mEq by mouth every evening.) 30 tablet 6   TRESIBA FLEXTOUCH 100 UNIT/ML SOPN FlexTouch Pen Inject 57 Units into the skin at bedtime.  6   No current facility-administered medications for this visit.     Past Surgical History:  Procedure Laterality Date   4th Toe Amputation Left Jan. 2014   4th toe in [place]   AMPUTATION Left 12/08/2012   Procedure: AMPUTATION DIGIT;  Surgeon: Kathryne Hitch, MD;  Location: [facility] OR;  Service: Orthopedics;  Laterality: Left;  3rd toe amputation possible 5th toe amputation   AMPUTATION Left 12/11/2012   Procedure: Amputation of 2nd Toe;  Surgeon: Kathryne Hitch, MD;  Location: [facility] OR;  Service: Orthopedics;  Laterality: Left;   AMPUTATION Right 06/29/2013   Procedure: AMPUTATION BELOW KNEE;  Surgeon: Nadara Mustard, MD;  Location: [facility] OR;  Service: Orthopedics;  Laterality: Right;   AMPUTATION Left 08/29/2014   Procedure: Left Great Toe Amputation at MTP;  Surgeon: Nadara Mustard, MD;  Location: [name];  Service: Orthopedics;  Laterality: Left;   AMPUTATION Left 10/31/2014   Procedure: Midfoot Amputation;  Surgeon: Nadara Mustard, MD;  Location: [facility] OR;  Service: Orthopedics;  Laterality: Left;   BYPASS GRAFT POPLITEAL TO TIBIAL Left 12/13/2012   Procedure: BYPASS GRAFT POPLITEAL TO TIBIAL;  Surgeon: Nada Libman, MD;  Location: [facility] OR;  Service: Vascular;  Laterality: Left;  Left Popliteal to Posterior Tibial Bypass Graft with reversed saphenous vein graft   CARDIAC CATHETERIZATION     COLONOSCOPY [name] 04/14/2017   Procedure: COLONOSCOPY;  Surgeon: West Bali, MD;  Location: [facility]ITE;  Service: Endoscopy;  Laterality: [name];  1000   COLONOSCOPY WITH PROPOFOL [name] 05/01/2023   Procedure: COLONOSCOPY WITH PROPOFOL;  Surgeon: Lanelle Bal, DO;  Location: [facility];   Service: Endoscopy;  Laterality: [name];  10:00 am, asa 3   I & D EXTREMITY Left 12/08/2012   Procedure: IRRIGATION AND DEBRIDEMENT EXTREMITY;  Surgeon: Kathryne Hitch, MD;  Location: [name] OR;  Service: Orthopedics;  Laterality: Left;   I & D EXTREMITY Left 12/11/2012   Procedure: REPEAT I&D LEFT FOOT;  Surgeon: Kathryne Hitch, MD;  Location: [name] OR;  Service: Orthopedics;  Laterality: Left;   INCISION AND DRAINAGE Right 06/25/2013   Procedure: INCISION AND DRAINAGE RIGHT FOOT;  Surgeon: Kathryne Hitch, MD;  Location: [facility] ORS;  Service: Orthopedics;  Laterality: Right;   LEFT HEART CATHETERIZATION WITH CORONARY ANGIOGRAM [name] 06/28/2013   Procedure: LEFT HEART CATHETERIZATION WITH CORONARY ANGIOGRAM;  Surgeon: Kathleene Hazel, MD;  Location: [facility] CATH LAB;  Service: Cardiovascular;  Laterality: [name];   MASS EXCISION [name] 11/15/2017   Procedure: EXCISION 4CM CYST ON NECK;  Surgeon: Lucretia Roers, MD;  Location: [facility] ORS;  Service: General;  Laterality: [name];   PENILE PROSTHESIS IMPLANT     POLYPECTOMY  04/14/2017   Procedure: POLYPECTOMY;  Surgeon: West Bali, MD;  Location: [facility];  Service: Endoscopy;;  colon   POLYPECTOMY  05/01/2023   Procedure: POLYPECTOMY;  Surgeon: Lanelle Bal, DO;  Location: [facility];  Service: Endoscopy;;   STUMP REVISION Right 08/16/2013   Procedure: STUMP REVISION- right Below Knee Amputation;  Surgeon: Nadara Mustard, MD;  Location: [facility] OR;  Service: Orthopedics;  Laterality: Right;   TEE WITHOUT CARDIOVERSION [name] 07/03/2013   Procedure: TRANSESOPHAGEAL ECHOCARDIOGRAM (TEE);  Surgeon: Laurey Morale, MD;  Location: [name] ENDOSCOPY;  Service: Cardiovascular;  Laterality: [name];   TONSILLECTOMY AND ADENOIDECTOMY       Allergies  Allergen Reactions   Amoxicillin Nausea And Vomiting           Family History  Problem Relation Age of Onset   Pancreatic cancer Mother    Diabetes Mother    Hyperlipidemia Mother    Breast cancer  Sister    Depression Maternal Grandmother    Heart disease Maternal Grandmother    Depression Maternal Grandfather    Heart disease Maternal Grandfather    Depression Paternal Grandmother    Heart disease Paternal Grandmother    Depression Paternal Grandfather    Heart disease Paternal Grandfather    Diabetes Other    Colon cancer Neg Hx      Social History Mr. Raser reports that he has never smoked. He has never used smokeless tobacco. Mr. Capel reports no history of alcohol use.   Review of Systems CONSTITUTIONAL: No weight loss, fever, chills, weakness or fatigue.  HEENT: Eyes: No visual loss, blurred vision, double vision or yellow sclerae.No hearing loss, sneezing, congestion, runny nose or sore throat.  SKIN: No rash or itching.  CARDIOVASCULAR: per hpi RESPIRATORY: No shortness of breath, cough or sputum.  GASTROINTESTINAL: No anorexia, nausea, vomiting or diarrhea. No abdominal pain or blood.  GENITOURINARY: No burning on urination, no polyuria NEUROLOGICAL: No headache, dizziness, syncope, paralysis, ataxia, numbness or tingling in the extremities. No change in bowel or bladder control.  MUSCULOSKELETAL: No muscle, back pain, joint pain or stiffness.  LYMPHATICS: No enlarged nodes. No history of splenectomy.  PSYCHIATRIC: No history of depression or anxiety.  ENDOCRINOLOGIC: No reports of sweating, cold or heat intolerance. No polyuria or polydipsia.  Marland Kitchen   Physical Examination Today's Vitals   09/26/23 0911  BP: 118/80  Pulse: 84  SpO2: 98%  Weight: 268 lb 6.4 oz (121.7 kg)  Height: 5\' 11"  (1.803 m)   Body mass index is 37.43 kg/m.  Gen: resting comfortably, no acute distress HEENT: no scleral icterus, pupils equal round and reactive, no palptable cervical adenopathy,  CV: RRR, no m/rg,no jvd Resp: Clear to auscultation bilaterally GI: abdomen is soft, non-tender, non-distended, normal bowel sounds, no hepatosplenomegaly MSK: extremities are warm, no edema.   Skin: warm, no rash Neuro:  no focal deficits Psych: appropriate affect   Diagnostic Studies  06/2013 Cath Hemodynamic Findings:  Central aortic pressure: 90/61 Left ventricular pressure: 90/19/29   Angiographic Findings:   Left main: Short segment without obstruction.     Left Anterior Descending Artery: Moderate caliber diffusely diseased vessel that courses to the apex. The proximal vessel has diffuse 40% stenosis. The mid vessel has a 70% stenosis followed by a 60% stenosis. The distal vessel has diffuse 90% stenosis. The diagonal is a moderate caliber vessel with ostial 99% stenosis with 99% disease extending into the mid vessel.     Circumflex Artery: Large caliber vessel with proximal 80-90% stenosis. There are three small to moderate caliber obtuse marginal branches. The first OM has mid 80% stenosis. The second OM has a proximal 99% stenosis. The third OM has diffuse 99% stenosis. The left sided posterolateral Shawntrice Salle has 99% stenosis.     Right Coronary Artery: Small non-dominant vessel with mid 99% stenosis.    Left Ventricular Angiogram: LVEF=40%   Impression: 1. Triple vessel CAD 2. NSTEMI secondary to demand ischemia from sepsis with severe underlying CAD 3. Mild LV systolic dysfunction   Recommendations: Complex situation. The only option for coronary revascularization is CABG. He is not a favorable candidate for CABG at this time with necrotic right foot, sepsis. Even though not ideal, would favor medical management of CAD while undergoing treatment of his necrotic foot. Would proceed with right BKA this weekend. I have discussed the case with CT surgery who agrees that CABG is not a good option at this time. Will ask CT surgery to see after he recovers from his BKA. Continue ASA, beta blocker, statin.          Complications:  None. The patient tolerated the procedure well.                11/2012 Carotid US Summary:  - No significant extracranial carotid artery  stenosis   demonstrated. Vertebrals are patent with antegrade flow. - ICA/CCA ratio= right = 0.94. left = 0.73. Other specific details can be found in the table(s) above.    Prepared and Electronically Authenticated by   03/2015 Carotid US IMPRESSION: Mild atherosclerotic disease in the carotid arteries, right side greater the left. The peak systolic velocity in the proximal right internal carotid artery is slightly elevated. Estimated degree of stenosis in the right internal carotid artery is close to 50%.   Estimated degree of stenosis in the left internal carotid artery is less than 50%.   Patent vertebral arteries.     12/07/15 Clinic EKG (performed and reviewed in clinic): NSR   01/2017 echo   Study Conclusions   - Left ventricle: The cavity size was normal. Wall thickness was at   the upper limits of normal. The estimated ejection fraction was   50%. Doppler parameters are consistent with abnormal left   ventricular relaxation (grade 1 diastolic dysfunction). - Aortic valve: Mildly calcified annulus. Trileaflet. - Mitral valve: Calcified annulus. Mildly thickened leaflets .   There was trivial regurgitation. - Tricuspid valve: There was trivial regurgitation. Peak RV-RA   gradient (S): 21 mm Hg. - Pulmonary arteries: Systolic pressure could not be accurately   estimated. - Pericardium, extracardiac: A trivial pericardial effusion was   identified.   Impressions:   - Overall limited images. Upper normal LV wall thickness with LVEF   approximately 50% based on best views. Cannot exclude periapical   wall motion abnormalities. Would suggest a limited study with   Definity contrast. Calcified mitral annulus with mildly thickened   leaflets and trivial mitral  regurgitation. Trivial tricuspid   regurgitation with RV-RA gradient 21 mmHg. Trivial pericardial   effusion.     02/2022 carotid US Summary:  Right Carotid: Velocities in the right ICA are consistent with a 1-39%   stenosis.   Left Carotid: Velocities in the left ICA are consistent with a 1-39%  stenosis.   Vertebrals:  Bilateral vertebral arteries demonstrate antegrade flow.  Subclavians: Normal flow hemodynamics were seen in bilateral subclavian               arteries.      Assessment and Plan   1. Chronic HFimpEF - LVEF has normalized based on most recent echo - no symptoms, continue current meds   2. CAD - denies any recent symptoms, continue current meds - EKG today SR, no ischemic changes     3. Hyperlipidemia - LDL is at goal, continue current therapy     4. HTN - bp is well controlled, continue current meds   5. Carotid stenosis -mild bilateral disease, we will continue to monitor     Antoine Poche, M.D.

## 2023-09-26 NOTE — Patient Instructions (Signed)
Medication Instructions:  Continue all current medications.   Labwork: none  Testing/Procedures: none  Follow-Up: 6 months   Any Other Special Instructions Will Be Listed Below (If Applicable).   If you need a refill on your cardiac medications before your next appointment, please call your pharmacy.  

## 2024-01-04 DIAGNOSIS — E1369 Other specified diabetes mellitus with other specified complication: Secondary | ICD-10-CM | POA: Diagnosis not present

## 2024-01-04 DIAGNOSIS — E782 Mixed hyperlipidemia: Secondary | ICD-10-CM | POA: Diagnosis not present

## 2024-01-10 ENCOUNTER — Encounter: Payer: Self-pay | Admitting: Internal Medicine

## 2024-01-10 DIAGNOSIS — I130 Hypertensive heart and chronic kidney disease with heart failure and stage 1 through stage 4 chronic kidney disease, or unspecified chronic kidney disease: Secondary | ICD-10-CM | POA: Diagnosis not present

## 2024-01-10 DIAGNOSIS — I10 Essential (primary) hypertension: Secondary | ICD-10-CM | POA: Diagnosis not present

## 2024-01-10 DIAGNOSIS — Z89511 Acquired absence of right leg below knee: Secondary | ICD-10-CM | POA: Diagnosis not present

## 2024-01-10 DIAGNOSIS — E1369 Other specified diabetes mellitus with other specified complication: Secondary | ICD-10-CM | POA: Diagnosis not present

## 2024-01-10 DIAGNOSIS — I5042 Chronic combined systolic (congestive) and diastolic (congestive) heart failure: Secondary | ICD-10-CM | POA: Diagnosis not present

## 2024-01-10 DIAGNOSIS — E1169 Type 2 diabetes mellitus with other specified complication: Secondary | ICD-10-CM | POA: Diagnosis not present

## 2024-01-10 DIAGNOSIS — N182 Chronic kidney disease, stage 2 (mild): Secondary | ICD-10-CM | POA: Diagnosis not present

## 2024-01-10 DIAGNOSIS — E782 Mixed hyperlipidemia: Secondary | ICD-10-CM | POA: Diagnosis not present

## 2024-01-10 DIAGNOSIS — D649 Anemia, unspecified: Secondary | ICD-10-CM | POA: Diagnosis not present

## 2024-04-03 ENCOUNTER — Encounter: Payer: Self-pay | Admitting: Cardiology

## 2024-04-03 ENCOUNTER — Ambulatory Visit: Payer: Medicare PPO | Attending: Cardiology | Admitting: Cardiology

## 2024-04-03 VITALS — BP 106/74 | HR 93 | Ht 71.0 in | Wt 266.4 lb

## 2024-04-03 DIAGNOSIS — I5032 Chronic diastolic (congestive) heart failure: Secondary | ICD-10-CM

## 2024-04-03 DIAGNOSIS — E782 Mixed hyperlipidemia: Secondary | ICD-10-CM

## 2024-04-03 DIAGNOSIS — I2510 Atherosclerotic heart disease of native coronary artery without angina pectoris: Secondary | ICD-10-CM | POA: Diagnosis not present

## 2024-04-03 DIAGNOSIS — I6523 Occlusion and stenosis of bilateral carotid arteries: Secondary | ICD-10-CM

## 2024-04-03 DIAGNOSIS — I10 Essential (primary) hypertension: Secondary | ICD-10-CM | POA: Diagnosis not present

## 2024-04-03 DIAGNOSIS — R0602 Shortness of breath: Secondary | ICD-10-CM | POA: Diagnosis not present

## 2024-04-03 NOTE — Patient Instructions (Signed)
 Medication Instructions:   Continue all current medications.   Labwork:  none  Testing/Procedures:  Your physician has requested that you have an echocardiogram. Echocardiography is a painless test that uses sound waves to create images of your heart. It provides your doctor with information about the size and shape of your heart and how well your heart's chambers and valves are working. This procedure takes approximately one hour. There are no restrictions for this procedure. Please do NOT wear cologne, perfume, aftershave, or lotions (deodorant is allowed). Please arrive 15 minutes prior to your appointment time.  Please note: We ask at that you not bring children with you during ultrasound (echo/ vascular) testing. Due to room size and safety concerns, children are not allowed in the ultrasound rooms during exams. Our front office staff cannot provide observation of children in our lobby area while testing is being conducted. An adult accompanying a patient to their appointment will only be allowed in the ultrasound room at the discretion of the ultrasound technician under special circumstances. We apologize for any inconvenience. Your physician has requested that you have a carotid duplex. This test is an ultrasound of the carotid arteries in your neck. It looks at blood flow through these arteries that supply the brain with blood. Allow one hour for this exam. There are no restrictions or special instructions.  Office will contact with results via phone, letter or mychart.     Follow-Up:  6 months   Any Other Special Instructions Will Be Listed Below (If Applicable).   If you need a refill on your cardiac medications before your next appointment, please call your pharmacy.

## 2024-04-03 NOTE — Progress Notes (Signed)
 Clinical Summary Albert [name] is a 63 y.o.male seen today for follow up of the following medical problems.       1. Chronic combined systolic/diastolic heart failure - ICM, LVEF 40-45% by echo 12/2013, grade II diastolic dysfunction - 01/2017 LVEF 50%, grade I diastolic dysfunction     - SOB at times which is new. Typically when exerting in high temperatures. Infrequent LE edema. No orthopnea, no PND. No chest pains.     2. CAD - hx of demand ischemia 06/2013 in setting of sepsis with necrotic foot wound. - cath at that time 06/2013 LM patent, LAD 40% prox, 70% mid follow by 60% lesion, distal 90%. Diag 99%. LCX 80-90% prox, OM1 80%, OM2 99%, OM3 99 % (all OMs are small), RCA small non-dom mid 99%. LVEF 40% by LVgram.   - CAD was managed medically after discussions with CT surgery at that time, with plans to reevaluate once recurring infection issues resolved.     - denies any chest pains   4. PAD - followed by vascular and [facility] - previous right BKA, previous multiple toe amputations. 08/30/14 treated for osteo and ulcer of left great toe with amputation. Jan 2016 had left midfoot amputation.   - Followed by ortho Dr Julio Ohm.        5. Hyperlipidemia - he is on atorvastatin  80mg  daily.  01/2023 TC 83 TG 100 HDL 28 LDL 36 - 09/2023 TC 104 TG 129 HDL 27 LDL 54 - compliant with meds   6. HTN - compliant with meds   7. 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 fasting 160s   GERD (gastroesophageal reflux disease)    Heart murmur    Hypercholesteremia    Hypertension    Myocardial infarction (HCC) 2014   Osteomyelitis (HCC)    first and third metatarsal   Pneumonia    hx of walking   Splenic infarct    setting of lupus anticoagulant   Stroke [facility]) 2011   right arm weakness     Allergies  Allergen Reactions   Amoxicillin Nausea And Vomiting          Current Outpatient Medications  Medication Sig Dispense Refill   aspirin  EC 81 MG tablet Take 1 tablet (81 mg total) by mouth daily. 30 tablet 1   atorvastatin  (LIPITOR ) 80 MG tablet Take 40 mg by mouth daily.     carvedilol  (COREG ) 25 MG tablet Take 25 mg by mouth 2 (two) times daily.      clotrimazole -betamethasone  (LOTRISONE ) cream Apply 1 application topically 2 (two) times daily. (Patient taking differently: Apply 1 application  topically daily as needed (rash).) 30 g 2   furosemide  (LASIX ) 80 MG tablet Take 80 mg by mouth daily.     gabapentin (NEURONTIN) 300 MG capsule Take 300 mg by mouth 3 (three)  times daily as needed (pain). Take 1 capsule 3 times a day by oral route as needed.     hydrALAZINE  (APRESOLINE ) 10 MG tablet Take 1 tablet by mouth 2 (two) times daily.     insulin  lispro (HUMALOG) 100 UNIT/ML KwikPen Inject 20 Units into the skin 3 (three) times daily before meals.     losartan  (COZAAR ) 50 MG tablet TAKE 1 TABLET BY MOUTH  DAILY 90 tablet 0   metFORMIN (GLUCOPHAGE-XR) 500 MG 24 hr tablet Take 500 mg by mouth in the morning and at bedtime.     Multiple Vitamins-Minerals (MENS 50+ MULTIVITAMIN) TABS Take 1 tablet by mouth daily.     nitroGLYCERIN  (NITRODUR - DOSED IN MG/24 HR) 0.2 mg/hr patch APPLY 1 PATCH ONTO SKIN  DAILY (Patient taking differently: Place 0.2 mg onto the skin daily as needed (promote blood flow). Apply to left foot) 90 patch 3   OZEMPIC, 0.25 OR 0.5 MG/DOSE, 2 MG/3ML SOPN Inject 0.25 mg into the skin once a  week.     pentoxifylline  (TRENTAL ) 400 MG CR tablet Take 1 tablet (400 mg total) by mouth 3 (three) times daily with meals. (Patient not taking: Reported on 09/26/2023) 90 tablet 11   potassium chloride  (K-DUR,KLOR-CON ) 10 MEQ tablet Take 1 tablet (10 mEq total) by mouth daily. (Patient taking differently: Take 10 mEq by mouth every evening.) 30 tablet 6   TRESIBA FLEXTOUCH 100 UNIT/ML SOPN FlexTouch Pen Inject 57 Units into the skin at bedtime.  6   No current facility-administered medications for this visit.     Past Surgical History:  Procedure Laterality Date   4th Toe Amputation Left Jan. 2014   4th toe in [place]   AMPUTATION Left 12/08/2012   Procedure: AMPUTATION DIGIT;  Surgeon: Arnie Lao, MD;  Location: [facility] OR;  Service: Orthopedics;  Laterality: Left;  3rd toe amputation possible 5th toe amputation   AMPUTATION Left 12/11/2012   Procedure: Amputation of 2nd Toe;  Surgeon: Arnie Lao, MD;  Location: [facility] OR;  Service: Orthopedics;  Laterality: Left;   AMPUTATION Right 06/29/2013   Procedure: AMPUTATION BELOW KNEE;  Surgeon: Timothy Ford, MD;  Location: [facility] OR;  Service: Orthopedics;  Laterality: Right;   AMPUTATION Left 08/29/2014   Procedure: Left Great Toe Amputation at MTP;  Surgeon: Timothy Ford, MD;  Location: [facility] OR;  Service: Orthopedics;  Laterality: Left;   AMPUTATION Left 10/31/2014   Procedure: Midfoot Amputation;  Surgeon: Timothy Ford, MD;  Location: [facility] OR;  Service: Orthopedics;  Laterality: Left;   BYPASS GRAFT POPLITEAL TO TIBIAL Left 12/13/2012   Procedure: BYPASS GRAFT POPLITEAL TO TIBIAL;  Surgeon: Margherita Shell, MD;  Location: [facility] OR;  Service: Vascular;  Laterality: Left;  Left Popliteal to Posterior Tibial Bypass Graft with reversed saphenous vein graft   CARDIAC CATHETERIZATION     COLONOSCOPY [name] 04/14/2017   Procedure: COLONOSCOPY;  Surgeon: Alyce Jubilee, MD;  Location: [facility] ENDO SUITE;  Service: Endoscopy;  Laterality: [name];  1000    COLONOSCOPY WITH PROPOFOL  [name] 05/01/2023   Procedure: COLONOSCOPY WITH PROPOFOL ;  Surgeon: Vinetta Greening, DO;  Location: [facility] ENDO SUITE;  Service: Endoscopy;  Laterality: [name];  10:00 am, asa 3   I & D EXTREMITY Left 12/08/2012   Procedure: IRRIGATION AND DEBRIDEMENT EXTREMITY;  Surgeon: Arnie Lao, MD;  Location[facility] OR;  Service: Orthopedics;  Laterality: Left;   I & D EXTREMITY Left 12/11/2012   Procedure: REPEAT I&D LEFT FOOT;  Surgeon:  Arnie Lao, MD;  Location: [facility] OR;  Service: Orthopedics;  Laterality: Left;   INCISION AND DRAINAGE Right 06/25/2013   Procedure: INCISION AND DRAINAGE RIGHT FOOT;  Surgeon: Arnie Lao, MD;  Location: [facility] ORS;  Service: Orthopedics;  Laterality: Right;   LEFT HEART CATHETERIZATION WITH CORONARY ANGIOGRAM [name] 06/28/2013   Procedure: LEFT HEART CATHETERIZATION WITH CORONARY ANGIOGRAM;  Surgeon: Odie Benne, MD;  Location: [facility] CATH LAB;  Service: Cardiovascular;  Laterality: [name];   MASS EXCISION [name] 11/15/2017   Procedure: EXCISION 4CM CYST ON NECK;  Surgeon: Awilda Bogus, MD;  Location: [facility] ORS;  Service: General;  Laterality: [name];   PENILE PROSTHESIS IMPLANT     POLYPECTOMY  04/14/2017   Procedure: POLYPECTOMY;  Surgeon: Alyce Jubilee, MD;  Location: [facility] END[facility] SUITE;  Service: Endoscopy;;  colon   POLYPECTOMY  05/01/2023   Procedure: POLYPECTOMY;  Surgeon: Vinetta Greening, DO;  Location: [facility] END[facility] SUITE;  Service: Endoscopy;;   STUMP REVISION Right 08/16/2013   Procedure: STUMP REVISION- right Below Knee Amputation;  Surgeon: Timothy Ford, MD;  Location: [facility] OR;  Service: Orthopedics;  Laterality: Right;   TEE WITHOUT CARDIOVERSION [name] 07/03/2013   Procedure: TRANSESOPHAGEAL ECHOCARDIOGRAM (TEE);  Surgeon: Darlis Eisenmenger, MD;  Location: [facility] ENDOSCOPY;  Service: Cardiovascular;  Laterality: [name];   TONSILLECTOMY AND ADENOIDECTOMY       Allergies  Allergen Reactions   Amoxicillin Nausea And Vomiting            Family History  Problem Relation Age of Onset   Pancreatic cancer Mother    Diabetes Mother    Hyperlipidemia Mother    Breast cancer Sister    Depression Maternal Grandmother    Heart disease Maternal Grandmother    Depression Maternal Grandfather    Heart disease Maternal Grandfather    Depression Paternal Grandmother    Heart disease Paternal Grandmother    Depression Paternal Grandfather    Heart disease Paternal Grandfather    Diabetes Other    Colon cancer Neg Hx      Social History Mr. Oquinn reports that he has never smoked. He has never used smokeless tobacco. Mr. Brenton reports no history of alcohol use.     Physical Examination Today's Vitals   04/03/24 0957  BP: 106/74  Pulse: 93  SpO2: 95%  Weight: 266 lb 6.4 oz (120.8 kg)  Height: 5' 11 (1.803 m)   Body mass index is 37.16 kg/m.  Gen: resting comfortably, no acute distress HEENT: no scleral icterus, pupils equal round and reactive, no palptable cervical adenopathy,  CV: RRR, no mrg, no jvd Resp: Clear to auscultation bilaterally GI: abdomen is soft, non-tender, non-distended, normal bowel sounds, no hepatosplenomegaly MSK: extremities are warm, no edema.  Skin: warm, no rash Neuro:  no focal deficits Psych: appropriate affect   Diagnostic Studies  06/2013 Cath Hemodynamic Findings: Central aortic pressure: 90/61 Left ventricular pressure: 90/19/29   Angiographic Findings:   Left main: Short segment without obstruction.     Left Anterior Descending Artery: Moderate caliber diffusely diseased vessel that courses to the apex. The proximal vessel has diffuse 40% stenosis. The mid vessel has a 70% stenosis followed by a 60% stenosis. The distal vessel has diffuse 90% stenosis. The diagonal is a moderate caliber vessel with ostial 99% stenosis with 99% disease extending into the mid vessel.     Circumflex Artery: Large caliber vessel with proximal 80-90% stenosis. There are three small to  moderate caliber obtuse marginal  branches. The first OM has mid 80% stenosis. The second OM has a proximal 99% stenosis. The third OM has diffuse 99% stenosis. The left sided posterolateral Breezie Micucci has 99% stenosis.     Right Coronary Artery: Small non-dominant vessel with mid 99% stenosis.    Left Ventricular Angiogram: LVEF=40%   Impression: 1. Triple vessel CAD 2. NSTEMI secondary to demand ischemia from sepsis with severe underlying CAD 3. Mild LV systolic dysfunction   Recommendations: Complex situation. The only option for coronary revascularization is CABG. He is not a favorable candidate for CABG at this time with necrotic right foot, sepsis. Even though not ideal, would favor medical management of CAD while undergoing treatment of his necrotic foot. Would proceed with right BKA this weekend. I have discussed the case with CT surgery who agrees that CABG is not a good option at this time. Will ask CT surgery to see after he recovers from his BKA. Continue ASA, beta blocker, statin.          Complications:  None. The patient tolerated the procedure well.                11/2012 Carotid US  Summary:  - No significant extracranial carotid artery stenosis   demonstrated. Vertebrals are patent with antegrade flow. - ICA/CCA ratio= right = 0.94. left = 0.73. Other specific details can be found in the table(s) above.    Prepared and Electronically Authenticated by   03/2015 Carotid US  IMPRESSION: Mild atherosclerotic disease in the carotid arteries, right side greater the left. The peak systolic velocity in the proximal right internal carotid artery is slightly elevated. Estimated degree of stenosis in the right internal carotid artery is close to 50%.   Estimated degree of stenosis in the left internal carotid artery is less than 50%.   Patent vertebral arteries.     12/07/15 Clinic EKG (performed and reviewed in clinic): NSR   01/2017 echo   Study Conclusions   - Left  ventricle: The cavity size was normal. Wall thickness was at   the upper limits of normal. The estimated ejection fraction was   50%. Doppler parameters are consistent with abnormal left   ventricular relaxation (grade 1 diastolic dysfunction). - Aortic valve: Mildly calcified annulus. Trileaflet. - Mitral valve: Calcified annulus. Mildly thickened leaflets .   There was trivial regurgitation. - Tricuspid valve: There was trivial regurgitation. Peak RV-RA   gradient (S): 21 mm Hg. - Pulmonary arteries: Systolic pressure could not be accurately   estimated. - Pericardium, extracardiac: A trivial pericardial effusion was   identified.   Impressions:   - Overall limited images. Upper normal LV wall thickness with LVEF   approximately 50% based on best views. Cannot exclude periapical   wall motion abnormalities. Would suggest a limited study with   Definity  contrast. Calcified mitral annulus with mildly thickened   leaflets and trivial mitral regurgitation. Trivial tricuspid   regurgitation with RV-RA gradient 21 mmHg. Trivial pericardial   effusion.     02/2022 carotid US  Summary:  Right Carotid: Velocities in the right ICA are consistent with a 1-39%  stenosis.   Left Carotid: Velocities in the left ICA are consistent with a 1-39%  stenosis.   Vertebrals:  Bilateral vertebral arteries demonstrate antegrade flow.  Subclavians: Normal flow hemodynamics were seen in bilateral subclavian               arteries.    Assessment and Plan  1. Chronic HFimpEF - some recent DOE that  is new, we will repeat his echo - if benign echo monitor at this time, if significant progression of symptoms over time consider ischemic testing. - continue current meds   2. CAD - no recent chest pains, continue current meds     3. Hyperlipidemia -at goal, continue current meds     4. HTN - bp is at goal, continue current meds   5. Carotid stenosis -repeat carotid US   F/u 6  months      Laurann Pollock, M.D.

## 2024-04-14 ENCOUNTER — Emergency Department (HOSPITAL_COMMUNITY)

## 2024-04-14 ENCOUNTER — Emergency Department (HOSPITAL_COMMUNITY)
Admission: EM | Admit: 2024-04-14 | Discharge: 2024-04-14 | Disposition: A | Discharge status: Home / Self Care | Attending: Emergency Medicine | Admitting: Emergency Medicine

## 2024-04-14 ENCOUNTER — Other Ambulatory Visit: Payer: Self-pay

## 2024-04-14 DIAGNOSIS — E119 Type 2 diabetes mellitus without complications: Secondary | ICD-10-CM | POA: Insufficient documentation

## 2024-04-14 DIAGNOSIS — E1122 Type 2 diabetes mellitus with diabetic chronic kidney disease: Secondary | ICD-10-CM | POA: Diagnosis present

## 2024-04-14 DIAGNOSIS — I5021 Acute systolic (congestive) heart failure: Secondary | ICD-10-CM | POA: Diagnosis not present

## 2024-04-14 DIAGNOSIS — I5031 Acute diastolic (congestive) heart failure: Secondary | ICD-10-CM | POA: Diagnosis not present

## 2024-04-14 DIAGNOSIS — D6862 Lupus anticoagulant syndrome: Secondary | ICD-10-CM | POA: Diagnosis present

## 2024-04-14 DIAGNOSIS — I5020 Unspecified systolic (congestive) heart failure: Secondary | ICD-10-CM | POA: Diagnosis not present

## 2024-04-14 DIAGNOSIS — D631 Anemia in chronic kidney disease: Secondary | ICD-10-CM | POA: Diagnosis present

## 2024-04-14 DIAGNOSIS — I700 Atherosclerosis of aorta: Secondary | ICD-10-CM | POA: Diagnosis not present

## 2024-04-14 DIAGNOSIS — Z89511 Acquired absence of right leg below knee: Secondary | ICD-10-CM | POA: Diagnosis not present

## 2024-04-14 DIAGNOSIS — I255 Ischemic cardiomyopathy: Secondary | ICD-10-CM | POA: Diagnosis present

## 2024-04-14 DIAGNOSIS — Z89432 Acquired absence of left foot: Secondary | ICD-10-CM | POA: Diagnosis not present

## 2024-04-14 DIAGNOSIS — I3139 Other pericardial effusion (noninflammatory): Secondary | ICD-10-CM | POA: Diagnosis not present

## 2024-04-14 DIAGNOSIS — Z794 Long term (current) use of insulin: Secondary | ICD-10-CM | POA: Diagnosis not present

## 2024-04-14 DIAGNOSIS — Z7982 Long term (current) use of aspirin: Secondary | ICD-10-CM | POA: Insufficient documentation

## 2024-04-14 DIAGNOSIS — I5023 Acute on chronic systolic (congestive) heart failure: Secondary | ICD-10-CM | POA: Diagnosis not present

## 2024-04-14 DIAGNOSIS — E11649 Type 2 diabetes mellitus with hypoglycemia without coma: Secondary | ICD-10-CM | POA: Diagnosis not present

## 2024-04-14 DIAGNOSIS — I2510 Atherosclerotic heart disease of native coronary artery without angina pectoris: Secondary | ICD-10-CM | POA: Diagnosis present

## 2024-04-14 DIAGNOSIS — R531 Weakness: Secondary | ICD-10-CM | POA: Diagnosis not present

## 2024-04-14 DIAGNOSIS — E8809 Other disorders of plasma-protein metabolism, not elsewhere classified: Secondary | ICD-10-CM | POA: Diagnosis present

## 2024-04-14 DIAGNOSIS — E1151 Type 2 diabetes mellitus with diabetic peripheral angiopathy without gangrene: Secondary | ICD-10-CM | POA: Diagnosis present

## 2024-04-14 DIAGNOSIS — I739 Peripheral vascular disease, unspecified: Secondary | ICD-10-CM | POA: Diagnosis not present

## 2024-04-14 DIAGNOSIS — E1165 Type 2 diabetes mellitus with hyperglycemia: Secondary | ICD-10-CM | POA: Diagnosis present

## 2024-04-14 DIAGNOSIS — R0602 Shortness of breath: Secondary | ICD-10-CM | POA: Diagnosis not present

## 2024-04-14 DIAGNOSIS — I2489 Other forms of acute ischemic heart disease: Secondary | ICD-10-CM | POA: Diagnosis present

## 2024-04-14 DIAGNOSIS — N1832 Chronic kidney disease, stage 3b: Secondary | ICD-10-CM | POA: Diagnosis present

## 2024-04-14 DIAGNOSIS — I5033 Acute on chronic diastolic (congestive) heart failure: Secondary | ICD-10-CM | POA: Diagnosis present

## 2024-04-14 DIAGNOSIS — Z79899 Other long term (current) drug therapy: Secondary | ICD-10-CM | POA: Insufficient documentation

## 2024-04-14 DIAGNOSIS — E46 Unspecified protein-calorie malnutrition: Secondary | ICD-10-CM | POA: Diagnosis not present

## 2024-04-14 DIAGNOSIS — I130 Hypertensive heart and chronic kidney disease with heart failure and stage 1 through stage 4 chronic kidney disease, or unspecified chronic kidney disease: Secondary | ICD-10-CM | POA: Diagnosis present

## 2024-04-14 DIAGNOSIS — Z9713 Presence of artificial right leg (complete) (partial): Secondary | ICD-10-CM | POA: Diagnosis not present

## 2024-04-14 DIAGNOSIS — I252 Old myocardial infarction: Secondary | ICD-10-CM | POA: Diagnosis not present

## 2024-04-14 DIAGNOSIS — E441 Mild protein-calorie malnutrition: Secondary | ICD-10-CM | POA: Diagnosis present

## 2024-04-14 DIAGNOSIS — E785 Hyperlipidemia, unspecified: Secondary | ICD-10-CM | POA: Diagnosis not present

## 2024-04-14 DIAGNOSIS — I110 Hypertensive heart disease with heart failure: Secondary | ICD-10-CM | POA: Insufficient documentation

## 2024-04-14 DIAGNOSIS — Z6841 Body Mass Index (BMI) 40.0 and over, adult: Secondary | ICD-10-CM | POA: Diagnosis not present

## 2024-04-14 DIAGNOSIS — I509 Heart failure, unspecified: Secondary | ICD-10-CM | POA: Diagnosis not present

## 2024-04-14 DIAGNOSIS — E66813 Obesity, class 3: Secondary | ICD-10-CM | POA: Diagnosis present

## 2024-04-14 DIAGNOSIS — E782 Mixed hyperlipidemia: Secondary | ICD-10-CM | POA: Diagnosis present

## 2024-04-14 DIAGNOSIS — J9811 Atelectasis: Secondary | ICD-10-CM | POA: Diagnosis present

## 2024-04-14 DIAGNOSIS — I10 Essential (primary) hypertension: Secondary | ICD-10-CM | POA: Diagnosis not present

## 2024-04-14 DIAGNOSIS — J90 Pleural effusion, not elsewhere classified: Secondary | ICD-10-CM | POA: Diagnosis present

## 2024-04-14 DIAGNOSIS — R609 Edema, unspecified: Secondary | ICD-10-CM | POA: Diagnosis present

## 2024-04-14 DIAGNOSIS — R0989 Other specified symptoms and signs involving the circulatory and respiratory systems: Secondary | ICD-10-CM | POA: Diagnosis not present

## 2024-04-14 DIAGNOSIS — I517 Cardiomegaly: Secondary | ICD-10-CM | POA: Diagnosis not present

## 2024-04-14 DIAGNOSIS — R7989 Other specified abnormal findings of blood chemistry: Secondary | ICD-10-CM | POA: Diagnosis not present

## 2024-04-14 DIAGNOSIS — R918 Other nonspecific abnormal finding of lung field: Secondary | ICD-10-CM | POA: Diagnosis not present

## 2024-04-14 LAB — CBC WITH DIFFERENTIAL/PLATELET
Abs Immature Granulocytes: 0.04 10*3/uL (ref 0.00–0.07)
Basophils Absolute: 0.1 10*3/uL (ref 0.0–0.1)
Basophils Relative: 1 %
Eosinophils Absolute: 0.2 10*3/uL (ref 0.0–0.5)
Eosinophils Relative: 2 %
HCT: 34.3 % — ABNORMAL LOW (ref 39.0–52.0)
Hemoglobin: 11 g/dL — ABNORMAL LOW (ref 13.0–17.0)
Immature Granulocytes: 0 %
Lymphocytes Relative: 14 %
Lymphs Abs: 1.5 10*3/uL (ref 0.7–4.0)
MCH: 27.1 pg (ref 26.0–34.0)
MCHC: 32.1 g/dL (ref 30.0–36.0)
MCV: 84.5 fL (ref 80.0–100.0)
Monocytes Absolute: 0.9 10*3/uL (ref 0.1–1.0)
Monocytes Relative: 9 %
Neutro Abs: 8 10*3/uL — ABNORMAL HIGH (ref 1.7–7.7)
Neutrophils Relative %: 74 %
Platelets: 340 10*3/uL (ref 150–400)
RBC: 4.06 MIL/uL — ABNORMAL LOW (ref 4.22–5.81)
RDW: 14 % (ref 11.5–15.5)
WBC: 10.7 10*3/uL — ABNORMAL HIGH (ref 4.0–10.5)
nRBC: 0 % (ref 0.0–0.2)

## 2024-04-14 LAB — COMPREHENSIVE METABOLIC PANEL WITH GFR
ALT: 35 U/L (ref 0–44)
AST: 15 U/L (ref 15–41)
Albumin: 3.7 g/dL (ref 3.5–5.0)
Alkaline Phosphatase: 66 U/L (ref 38–126)
Anion gap: 13 (ref 5–15)
BUN: 32 mg/dL — ABNORMAL HIGH (ref 8–23)
CO2: 21 mmol/L — ABNORMAL LOW (ref 22–32)
Calcium: 8.7 mg/dL — ABNORMAL LOW (ref 8.9–10.3)
Chloride: 104 mmol/L (ref 98–111)
Creatinine, Ser: 1.5 mg/dL — ABNORMAL HIGH (ref 0.61–1.24)
GFR, Estimated: 53 mL/min — ABNORMAL LOW (ref >60)
Glucose, Bld: 157 mg/dL — ABNORMAL HIGH (ref 70–99)
Potassium: 4.1 mmol/L (ref 3.5–5.1)
Sodium: 138 mmol/L (ref 135–145)
Total Bilirubin: 0.9 mg/dL (ref 0.0–1.2)
Total Protein: 6.8 g/dL (ref 6.5–8.1)

## 2024-04-14 LAB — BRAIN NATRIURETIC PEPTIDE: B Natriuretic Peptide: 859 pg/mL — ABNORMAL HIGH (ref 0.0–100.0)

## 2024-04-14 LAB — D-DIMER, QUANTITATIVE: D-Dimer, Quant: 1.61 ug{FEU}/mL — ABNORMAL HIGH (ref 0.00–0.50)

## 2024-04-14 LAB — TROPONIN I (HIGH SENSITIVITY)
Troponin I (High Sensitivity): 33 ng/L — ABNORMAL HIGH (ref <18)
Troponin I (High Sensitivity): 33 ng/L — ABNORMAL HIGH (ref <18)

## 2024-04-14 MED ORDER — FUROSEMIDE 10 MG/ML IJ SOLN
80.0000 mg | Freq: Once | INTRAMUSCULAR | Status: AC
  Administered 2024-04-14: 80 mg via INTRAVENOUS
  Filled 2024-04-14: qty 8

## 2024-04-14 MED ORDER — IOHEXOL 350 MG/ML SOLN
75.0000 mL | Freq: Once | INTRAVENOUS | Status: AC | PRN
  Administered 2024-04-14: 75 mL via INTRAVENOUS

## 2024-04-14 NOTE — ED Notes (Signed)
 Patient transported to CT

## 2024-04-14 NOTE — ED Triage Notes (Signed)
 Pt has several complaints: SOB, fatigue, hard to sleep, fluid on left lower leg x 2 weeks. Pt also c/o abd pain for a few days. Pt saw Dr. Alvan recently with no new orders. Pt has hx of CHF.

## 2024-04-14 NOTE — ED Provider Notes (Signed)
 [place] EMERGENCY DEPARTMENT AT [facility] Provider Note   CSN: [id number] Arrival date & time: 04/14/24  9164     Patient presents with: Leg Swelling and Shortness of Breath   IBN STIEF is a 63 y.o. male.  {Add pertinent medical, surgical, social history, OB history to YEP:67052} Patient has a history of hypertension coronary disease congestive heart failure, diabetes.  He has noticed a lot of swelling in his left lower leg.  Patient's right lower leg is amputated.   Shortness of Breath      Prior to Admission medications   Medication Sig Start Date End Date Taking? Authorizing Provider  atorvastatin  (LIPITOR ) 40 MG tablet Take 40 mg by mouth daily. 04/13/24  Yes [provider]  ACCU-CHEK GUIDE TEST test strip SMARTSIG:Strip(s) 02/23/24   [provider]  aspirin  EC 81 MG tablet Take 1 tablet (81 mg total) by mouth daily. 01/09/14   Antoinette Doe, MD  carvedilol  (COREG ) 25 MG tablet Take 25 mg by mouth 2 (two) times daily.     [provider]  clotrimazole -betamethasone  (LOTRISONE ) cream Apply 1 application topically 2 (two) times daily. Patient taking differently: Apply 1 application  topically daily as needed (rash). 06/17/21   Watt Rush, MD  furosemide  (LASIX ) 80 MG tablet Take 80 mg by mouth daily. 08/08/13   Angiulli, Toribio PARAS, PA-C  gabapentin (NEURONTIN) 300 MG capsule Take 300 mg by mouth 3 (three) times daily as needed (pain). Take 1 capsule 3 times a day by oral route as needed. 11/01/22   [provider]  hydrALAZINE  (APRESOLINE ) 10 MG tablet Take 1 tablet by mouth 2 (two) times daily. 09/11/22   [provider]  insulin  lispro (HUMALOG) 100 UNIT/ML KwikPen Inject 20 Units into the skin 3 (three) times daily before meals. Patient taking differently: Inject 25 Units into the skin 3 (three) times daily before meals.    [provider]  losartan  (COZAAR ) 50 MG tablet TAKE 1 TABLET BY MOUTH  DAILY  12/03/18   Alvan Dorn FALCON, MD  metFORMIN (GLUCOPHAGE-XR) 500 MG 24 hr tablet Take 500 mg by mouth in the morning and at bedtime. 01/29/19   [provider]  Multiple Vitamins-Minerals (MENS 50+ MULTIVITAMIN) TABS Take 1 tablet by mouth daily.    [provider]  nitroGLYCERIN  (NITRODUR - DOSED IN MG/24 HR) 0.2 mg/hr patch APPLY 1 PATCH ONTO SKIN  DAILY Patient taking differently: Place 0.2 mg onto the skin daily as needed (promote blood flow). Apply to left foot 10/28/21   Barbarann Oneil BROCKS, MD  OZEMPIC, 1 MG/DOSE, 4 MG/3ML SOPN Inject 1 mg into the skin once a week. 04/06/24   [provider]  pentoxifylline  (TRENTAL ) 400 MG CR tablet Take 1 tablet (400 mg total) by mouth 3 (three) times daily with meals. Patient not taking: Reported on 04/03/2024 03/01/19   Rayburn, Elouise Phlegm, PA-C  potassium chloride  (K-DUR,KLOR-CON ) 10 MEQ tablet Take 1 tablet (10 mEq total) by mouth daily. 04/08/15   Alvan Dorn FALCON, MD  TRESIBA FLEXTOUCH 100 UNIT/ML SOPN FlexTouch Pen Inject 57 Units into the skin at bedtime. 07/23/18   [provider]    Allergies: Amoxicillin    Review of Systems  Respiratory:  Positive for shortness of breath.     Updated Vital Signs BP 118/86   Pulse 95   Temp 98.1 F (36.7 C) (Oral)   Resp (!) 21   Ht 5' 11 (1.803 m)   Wt 120.9 kg  SpO2 98%   BMI 37.17 kg/m   Physical Exam  (all labs ordered are listed, but only abnormal results are displayed) Labs Reviewed  CBC WITH DIFFERENTIAL/PLATELET - Abnormal; Notable for the following components:      Result Value   WBC 10.7 (*)    RBC 4.06 (*)    Hemoglobin 11.0 (*)    HCT 34.3 (*)    Neutro Abs 8.0 (*)    All other components within normal limits  COMPREHENSIVE METABOLIC PANEL WITH GFR - Abnormal; Notable for the following components:   CO2 21 (*)    Glucose, Bld 157 (*)    BUN 32 (*)    Creatinine, Ser 1.50 (*)    Calcium  8.7 (*)    GFR, Estimated 53 (*)    All other  components within normal limits  BRAIN NATRIURETIC PEPTIDE - Abnormal; Notable for the following components:   B Natriuretic Peptide 859.0 (*)    All other components within normal limits  D-DIMER, QUANTITATIVE - Abnormal; Notable for the following components:   D-Dimer, Quant 1.61 (*)    All other components within normal limits  TROPONIN I (HIGH SENSITIVITY) - Abnormal; Notable for the following components:   Troponin I (High Sensitivity) 33 (*)    All other components within normal limits  TROPONIN I (HIGH SENSITIVITY) - Abnormal; Notable for the following components:   Troponin I (High Sensitivity) 33 (*)    All other components within normal limits    EKG: None  Radiology: CT Angio Chest PE W and/or Wo Contrast Result Date: 04/14/2024 CLINICAL DATA:  Pulmonary embolism (PE) suspected, high prob EXAM: CT ANGIOGRAPHY CHEST WITH CONTRAST TECHNIQUE: Multidetector CT imaging of the chest was performed using the standard protocol during bolus administration of intravenous contrast. Multiplanar CT image reconstructions and MIPs were obtained to evaluate the vascular anatomy. RADIATION DOSE REDUCTION: This exam was performed according to the departmental dose-optimization program which includes automated exposure control, adjustment of the mA and/or kV according to patient size and/or use of iterative reconstruction technique. CONTRAST:  75mL OMNIPAQUE  IOHEXOL  350 MG/ML SOLN COMPARISON:  CT abdomen 06/30/2014 FINDINGS: Cardiovascular: Heart size upper limits normal. Small pericardial effusion. Satisfactory opacification of pulmonary arteries noted, and there is no evidence of pulmonary emboli. Extensive coronary calcifications. Incomplete opacification of the thoracic aorta limiting evaluation for dissection. No aneurysm. Mild scattered calcified atheromatous plaque in the arch and descending thoracic aorta. Mediastinum/Nodes: No mediastinal hematoma, mass, or adenopathy. Subcentimeter right  paratracheal and precarinal lymph nodes are noted. Lungs/Pleura: Moderate right and smaller left pleural effusions. No pneumothorax. Patchy atelectasis/consolidation in the lung bases right greater than left. 7 mm nodule, medial basal segment left lower lobe (6:99) . Upper Abdomen: No acute findings. Musculoskeletal: No chest wall abnormality. No acute or significant osseous findings. Review of the MIP images confirms the above findings. IMPRESSION: 1. Negative for acute PE or thoracic aortic dissection. 2. Moderate right and smaller left pleural effusions with bibasilar atelectasis/consolidation. 3. 7 mm left lower lobe nodule. Non-contrast chest CT at 6-12 months is recommended. If the nodule is stable at time of repeat CT, then future CT at 18-24 months (from today's scan) is considered optional for low-risk patients, but is recommended for high-risk patients. This recommendation follows the consensus statement: Guidelines for Management of Incidental Pulmonary Nodules Detected on CT Images: From the Fleischner Society 2017; Radiology 2017; 284:228-243. 4.  Aortic Atherosclerosis (ICD10-I70.0). Electronically Signed   By: JONETTA Faes M.D.   On: 04/14/2024 10:39  DG Chest Port 1 View Result Date: 04/14/2024 CLINICAL DATA:  Shortness of breath EXAM: PORTABLE CHEST 1 VIEW COMPARISON:  12/27/2018 FINDINGS: Cardiac enlargement. Low lung volumes. Blunting of both costophrenic angles. Pulmonary vascular congestion without frank edema. No airspace opacities. Mild platelike atelectasis in the lower lungs. Osseous structures appear intact. IMPRESSION: 1. Cardiac enlargement and pulmonary vascular congestion. 2. Low lung volumes and mild platelike atelectasis in the lower lungs. Electronically Signed   By: Waddell Calk M.D.   On: 04/14/2024 09:26    {Document cardiac monitor, telemetry assessment procedure when appropriate:32947} Procedures   Medications Ordered in the ED  furosemide  (LASIX ) injection 80 mg (has  no administration in time range)  iohexol  (OMNIPAQUE ) 350 MG/ML injection 75 mL (75 mLs Intravenous Contrast Given 04/14/24 1016)      {Click here for ABCD2, HEART and other calculators REFRESH Note before signing:1}                              Medical Decision Making Amount and/or Complexity of Data Reviewed Labs: ordered. Radiology: ordered. ECG/medicine tests: ordered.  Risk Prescription drug management.   Patient with worsening congestive heart failure.  He is given 80 Lasix  IV here and will double up on his Lasix  and follow-up with his cardiologist this week  {Document critical care time when appropriate  Document review of labs and clinical decision tools ie CHADS2VASC2, etc  Document your independent review of radiology images and any outside records  Document your discussion with family members, caretakers and with consultants  Document social determinants of health affecting pt's care  Document your decision making why or why not admission, treatments were needed:32947:::1}   Final diagnoses:  Systolic congestive heart failure, unspecified HF chronicity [facility])    ED Discharge Orders     None

## 2024-04-14 NOTE — ED Notes (Signed)
 EDP at bedside during triage

## 2024-04-14 NOTE — Discharge Instructions (Signed)
 Make sure you take your Lasix  twice a day.  Call your cardiologist office Monday for follow-up this week. return if any problems

## 2024-04-14 NOTE — Progress Notes (Signed)
 Discharge instructions explained. VSS. No needs at this time.

## 2024-04-14 NOTE — ED Notes (Signed)
 IV lasix  given. Pt ambulated to bathroom without issue.

## 2024-04-15 ENCOUNTER — Other Ambulatory Visit: Payer: Self-pay | Admitting: *Deleted

## 2024-04-15 ENCOUNTER — Telehealth: Payer: Self-pay | Admitting: Cardiology

## 2024-04-15 NOTE — Telephone Encounter (Signed)
 Pt would like a c/b to discuss his hospital visit. Please advise

## 2024-04-15 NOTE — Telephone Encounter (Signed)
 Patient seen in ED on 04/14/2024 - at time of discharge, follow up was suggested in 1 week.  Patient is already scheduled for 04/30/24 for Echo & Carotid.  Will also need to have time to to get his ride scheduled with [employer].  OV scheduled for 05/17/24 at 10:00 am with Almarie Crate, NP in [place] office.

## 2024-04-15 NOTE — Telephone Encounter (Signed)
Pt retuning nurses call      Please advise

## 2024-04-15 NOTE — Telephone Encounter (Signed)
 Left message to return call

## 2024-04-16 ENCOUNTER — Inpatient Hospital Stay (HOSPITAL_COMMUNITY)
Admission: EM | Admit: 2024-04-16 | Discharge: 2024-04-20 | DRG: 291 | Disposition: A | Discharge status: Home / Self Care | Attending: Internal Medicine | Admitting: Internal Medicine

## 2024-04-16 ENCOUNTER — Emergency Department (HOSPITAL_COMMUNITY)

## 2024-04-16 ENCOUNTER — Encounter (HOSPITAL_COMMUNITY): Payer: Self-pay

## 2024-04-16 ENCOUNTER — Other Ambulatory Visit: Payer: Self-pay

## 2024-04-16 DIAGNOSIS — I5033 Acute on chronic diastolic (congestive) heart failure: Secondary | ICD-10-CM | POA: Diagnosis present

## 2024-04-16 DIAGNOSIS — Z89511 Acquired absence of right leg below knee: Secondary | ICD-10-CM | POA: Diagnosis not present

## 2024-04-16 DIAGNOSIS — R7989 Other specified abnormal findings of blood chemistry: Secondary | ICD-10-CM | POA: Insufficient documentation

## 2024-04-16 DIAGNOSIS — E46 Unspecified protein-calorie malnutrition: Secondary | ICD-10-CM | POA: Diagnosis not present

## 2024-04-16 DIAGNOSIS — E66813 Obesity, class 3: Secondary | ICD-10-CM | POA: Diagnosis present

## 2024-04-16 DIAGNOSIS — E1165 Type 2 diabetes mellitus with hyperglycemia: Secondary | ICD-10-CM | POA: Diagnosis present

## 2024-04-16 DIAGNOSIS — E11649 Type 2 diabetes mellitus with hypoglycemia without coma: Secondary | ICD-10-CM | POA: Diagnosis not present

## 2024-04-16 DIAGNOSIS — I5021 Acute systolic (congestive) heart failure: Secondary | ICD-10-CM | POA: Diagnosis not present

## 2024-04-16 DIAGNOSIS — I509 Heart failure, unspecified: Principal | ICD-10-CM

## 2024-04-16 DIAGNOSIS — E1151 Type 2 diabetes mellitus with diabetic peripheral angiopathy without gangrene: Secondary | ICD-10-CM | POA: Diagnosis present

## 2024-04-16 DIAGNOSIS — I130 Hypertensive heart and chronic kidney disease with heart failure and stage 1 through stage 4 chronic kidney disease, or unspecified chronic kidney disease: Secondary | ICD-10-CM | POA: Diagnosis present

## 2024-04-16 DIAGNOSIS — I252 Old myocardial infarction: Secondary | ICD-10-CM

## 2024-04-16 DIAGNOSIS — Z794 Long term (current) use of insulin: Secondary | ICD-10-CM

## 2024-04-16 DIAGNOSIS — R609 Edema, unspecified: Secondary | ICD-10-CM | POA: Diagnosis present

## 2024-04-16 DIAGNOSIS — I6529 Occlusion and stenosis of unspecified carotid artery: Secondary | ICD-10-CM | POA: Diagnosis present

## 2024-04-16 DIAGNOSIS — E1122 Type 2 diabetes mellitus with diabetic chronic kidney disease: Secondary | ICD-10-CM | POA: Diagnosis present

## 2024-04-16 DIAGNOSIS — E8809 Other disorders of plasma-protein metabolism, not elsewhere classified: Secondary | ICD-10-CM | POA: Diagnosis present

## 2024-04-16 DIAGNOSIS — Z7985 Long-term (current) use of injectable non-insulin antidiabetic drugs: Secondary | ICD-10-CM

## 2024-04-16 DIAGNOSIS — Z880 Allergy status to penicillin: Secondary | ICD-10-CM

## 2024-04-16 DIAGNOSIS — Z833 Family history of diabetes mellitus: Secondary | ICD-10-CM

## 2024-04-16 DIAGNOSIS — D631 Anemia in chronic kidney disease: Secondary | ICD-10-CM | POA: Diagnosis present

## 2024-04-16 DIAGNOSIS — Z7984 Long term (current) use of oral hypoglycemic drugs: Secondary | ICD-10-CM

## 2024-04-16 DIAGNOSIS — I255 Ischemic cardiomyopathy: Secondary | ICD-10-CM | POA: Diagnosis present

## 2024-04-16 DIAGNOSIS — N1832 Chronic kidney disease, stage 3b: Secondary | ICD-10-CM | POA: Diagnosis present

## 2024-04-16 DIAGNOSIS — I5031 Acute diastolic (congestive) heart failure: Secondary | ICD-10-CM | POA: Diagnosis not present

## 2024-04-16 DIAGNOSIS — D6862 Lupus anticoagulant syndrome: Secondary | ICD-10-CM | POA: Diagnosis present

## 2024-04-16 DIAGNOSIS — I2489 Other forms of acute ischemic heart disease: Secondary | ICD-10-CM | POA: Diagnosis present

## 2024-04-16 DIAGNOSIS — E441 Mild protein-calorie malnutrition: Secondary | ICD-10-CM | POA: Diagnosis present

## 2024-04-16 DIAGNOSIS — Z83438 Family history of other disorder of lipoprotein metabolism and other lipidemia: Secondary | ICD-10-CM

## 2024-04-16 DIAGNOSIS — I739 Peripheral vascular disease, unspecified: Secondary | ICD-10-CM | POA: Diagnosis not present

## 2024-04-16 DIAGNOSIS — J9811 Atelectasis: Secondary | ICD-10-CM | POA: Diagnosis present

## 2024-04-16 DIAGNOSIS — Z8701 Personal history of pneumonia (recurrent): Secondary | ICD-10-CM

## 2024-04-16 DIAGNOSIS — R531 Weakness: Secondary | ICD-10-CM | POA: Diagnosis not present

## 2024-04-16 DIAGNOSIS — E782 Mixed hyperlipidemia: Secondary | ICD-10-CM | POA: Diagnosis present

## 2024-04-16 DIAGNOSIS — J90 Pleural effusion, not elsewhere classified: Secondary | ICD-10-CM | POA: Diagnosis present

## 2024-04-16 DIAGNOSIS — R0989 Other specified symptoms and signs involving the circulatory and respiratory systems: Secondary | ICD-10-CM | POA: Diagnosis not present

## 2024-04-16 DIAGNOSIS — Z7982 Long term (current) use of aspirin: Secondary | ICD-10-CM

## 2024-04-16 DIAGNOSIS — I5023 Acute on chronic systolic (congestive) heart failure: Secondary | ICD-10-CM | POA: Diagnosis not present

## 2024-04-16 DIAGNOSIS — Z6841 Body Mass Index (BMI) 40.0 and over, adult: Secondary | ICD-10-CM | POA: Diagnosis not present

## 2024-04-16 DIAGNOSIS — Z79899 Other long term (current) drug therapy: Secondary | ICD-10-CM

## 2024-04-16 DIAGNOSIS — Z9713 Presence of artificial right leg (complete) (partial): Secondary | ICD-10-CM | POA: Diagnosis not present

## 2024-04-16 DIAGNOSIS — E785 Hyperlipidemia, unspecified: Secondary | ICD-10-CM | POA: Diagnosis not present

## 2024-04-16 DIAGNOSIS — Z8673 Personal history of transient ischemic attack (TIA), and cerebral infarction without residual deficits: Secondary | ICD-10-CM

## 2024-04-16 DIAGNOSIS — I2510 Atherosclerotic heart disease of native coronary artery without angina pectoris: Secondary | ICD-10-CM | POA: Diagnosis present

## 2024-04-16 DIAGNOSIS — Z89432 Acquired absence of left foot: Secondary | ICD-10-CM | POA: Diagnosis not present

## 2024-04-16 DIAGNOSIS — K219 Gastro-esophageal reflux disease without esophagitis: Secondary | ICD-10-CM | POA: Diagnosis present

## 2024-04-16 DIAGNOSIS — Z8249 Family history of ischemic heart disease and other diseases of the circulatory system: Secondary | ICD-10-CM

## 2024-04-16 DIAGNOSIS — I5042 Chronic combined systolic (congestive) and diastolic (congestive) heart failure: Secondary | ICD-10-CM

## 2024-04-16 DIAGNOSIS — I10 Essential (primary) hypertension: Secondary | ICD-10-CM | POA: Diagnosis not present

## 2024-04-16 LAB — CBC WITH DIFFERENTIAL/PLATELET
Abs Immature Granulocytes: 0.04 10*3/uL (ref 0.00–0.07)
Basophils Absolute: 0.1 10*3/uL (ref 0.0–0.1)
Basophils Relative: 1 %
Eosinophils Absolute: 0.2 10*3/uL (ref 0.0–0.5)
Eosinophils Relative: 2 %
HCT: 32.1 % — ABNORMAL LOW (ref 39.0–52.0)
Hemoglobin: 10.2 g/dL — ABNORMAL LOW (ref 13.0–17.0)
Immature Granulocytes: 1 %
Lymphocytes Relative: 21 %
Lymphs Abs: 1.7 10*3/uL (ref 0.7–4.0)
MCH: 26.6 pg (ref 26.0–34.0)
MCHC: 31.8 g/dL (ref 30.0–36.0)
MCV: 83.8 fL (ref 80.0–100.0)
Monocytes Absolute: 0.8 10*3/uL (ref 0.1–1.0)
Monocytes Relative: 10 %
Neutro Abs: 5.5 10*3/uL (ref 1.7–7.7)
Neutrophils Relative %: 65 %
Platelets: 292 10*3/uL (ref 150–400)
RBC: 3.83 MIL/uL — ABNORMAL LOW (ref 4.22–5.81)
RDW: 14.3 % (ref 11.5–15.5)
WBC: 8.3 10*3/uL (ref 4.0–10.5)
nRBC: 0 % (ref 0.0–0.2)

## 2024-04-16 LAB — COMPREHENSIVE METABOLIC PANEL WITH GFR
ALT: 36 U/L (ref 0–44)
AST: 21 U/L (ref 15–41)
Albumin: 3.4 g/dL — ABNORMAL LOW (ref 3.5–5.0)
Alkaline Phosphatase: 62 U/L (ref 38–126)
Anion gap: 11 (ref 5–15)
BUN: 35 mg/dL — ABNORMAL HIGH (ref 8–23)
CO2: 23 mmol/L (ref 22–32)
Calcium: 8.4 mg/dL — ABNORMAL LOW (ref 8.9–10.3)
Chloride: 103 mmol/L (ref 98–111)
Creatinine, Ser: 1.32 mg/dL — ABNORMAL HIGH (ref 0.61–1.24)
GFR, Estimated: >60 mL/min (ref >60)
Glucose, Bld: 69 mg/dL — ABNORMAL LOW (ref 70–99)
Potassium: 3.7 mmol/L (ref 3.5–5.1)
Sodium: 137 mmol/L (ref 135–145)
Total Bilirubin: 0.9 mg/dL (ref 0.0–1.2)
Total Protein: 6.3 g/dL — ABNORMAL LOW (ref 6.5–8.1)

## 2024-04-16 LAB — TROPONIN I (HIGH SENSITIVITY): Troponin I (High Sensitivity): 37 ng/L — ABNORMAL HIGH (ref <18)

## 2024-04-16 LAB — CBG MONITORING, ED: Glucose-Capillary: 70 mg/dL (ref 70–99)

## 2024-04-16 LAB — BRAIN NATRIURETIC PEPTIDE: B Natriuretic Peptide: 862 pg/mL — ABNORMAL HIGH (ref 0.0–100.0)

## 2024-04-16 LAB — GLUCOSE, CAPILLARY: Glucose-Capillary: 112 mg/dL — ABNORMAL HIGH (ref 70–99)

## 2024-04-16 MED ORDER — FUROSEMIDE 10 MG/ML IJ SOLN
40.0000 mg | Freq: Once | INTRAMUSCULAR | Status: AC
Start: 2024-04-16 — End: 2024-04-16
  Administered 2024-04-16: 40 mg via INTRAVENOUS
  Filled 2024-04-16: qty 4

## 2024-04-16 NOTE — ED Triage Notes (Signed)
 Pt arrived via POV c/o on-going edema and swelling in his LLE, and Pt reports being evaluated here 2 days ago for same complaint. Pt now reports his weakness is worsening.

## 2024-04-16 NOTE — ED Provider Notes (Signed)
 [place] EMERGENCY DEPARTMENT AT [facility] Provider Note   CSN: [id number] Arrival date & time: 04/16/24  1718     Patient presents with: Edema   Albert [name] is a 63 y.o. male.   HPI     Albert [name] is a 63 y.o. male medical history of type 2 diabetes, hypertension, prior MI, CHF and prior stroke with some right arm weakness at baseline who presents to the Emergency Department complaining of persistent lower extremity edema, fatigue and generalized weakness.  Seen here 2 days ago for similar symptoms.  He takes 80 mg Lasix  daily denies any missed doses.  Continues to have swelling of his left lower extremity, history of right BKA.  Describes having some exertional shortness of breath, no chest pain, denies cough fever or chills.  States he is scheduled to see his cardiologist later this month for carotid study and echocardiogram  Prior to Admission medications   Medication Sig Start Date End Date Taking? Authorizing Provider  ACCU-CHEK GUIDE TEST test strip SMARTSIG:Strip(s) 02/23/24   [provider]  aspirin  EC 81 MG tablet Take 1 tablet (81 mg total) by mouth daily. 01/09/14   Antoinette Doe, MD  atorvastatin  (LIPITOR ) 40 MG tablet Take 40 mg by mouth daily. 04/13/24   [provider]  carvedilol  (COREG ) 25 MG tablet Take 25 mg by mouth 2 (two) times daily.     [provider]  clotrimazole -betamethasone  (LOTRISONE ) cream Apply 1 application topically 2 (two) times daily. Patient taking differently: Apply 1 application  topically daily as needed (rash). 06/17/21   Watt Rush, MD  furosemide  (LASIX ) 80 MG tablet Take 80 mg by mouth daily. 08/08/13   Angiulli, Toribio PARAS, PA-C  gabapentin (NEURONTIN) 300 MG capsule Take 300 mg by mouth 3 (three) times daily as needed (pain). Take 1 capsule 3 times a day by oral route as needed. 11/01/22   [provider]  hydrALAZINE  (APRESOLINE ) 10 MG tablet Take 1 tablet by mouth 2 (two) times  daily. 09/11/22   [provider]  insulin  lispro (HUMALOG) 100 UNIT/ML KwikPen Inject 20 Units into the skin 3 (three) times daily before meals. Patient taking differently: Inject 25 Units into the skin 3 (three) times daily before meals.    [provider]  losartan  (COZAAR ) 50 MG tablet TAKE 1 TABLET BY MOUTH  DAILY 12/03/18   Alvan Dorn FALCON, MD  metFORMIN (GLUCOPHAGE-XR) 500 MG 24 hr tablet Take 500 mg by mouth in the morning and at bedtime. 01/29/19   [provider]  Multiple Vitamins-Minerals (MENS 50+ MULTIVITAMIN) TABS Take 1 tablet by mouth daily.    [provider]  nitroGLYCERIN  (NITRODUR - DOSED IN MG/24 HR) 0.2 mg/hr patch APPLY 1 PATCH ONTO SKIN  DAILY Patient taking differently: Place 0.2 mg onto the skin daily as needed (promote blood flow). Apply to left foot 10/28/21   Barbarann Oneil BROCKS, MD  OZEMPIC, 1 MG/DOSE, 4 MG/3ML SOPN Inject 1 mg into the skin once a week. 04/06/24   [provider]  pentoxifylline  (TRENTAL ) 400 MG CR tablet Take 1 tablet (400 mg total) by mouth 3 (three) times daily with meals. Patient not taking: Reported on 04/03/2024 03/01/19   Rayburn, Elouise Phlegm, PA-C  potassium chloride  (K-DUR,KLOR-CON ) 10 MEQ tablet Take 1 tablet (10 mEq total) by mouth daily. 04/08/15   Alvan Dorn FALCON, MD  TRESIBA FLEXTOUCH 100 UNIT/ML SOPN FlexTouch Pen Inject 57 Units into the skin at bedtime. 07/23/18  [provider]    Allergies: Amoxicillin    Review of Systems  Constitutional:  Negative for appetite change, chills and fever.  Respiratory:  Positive for shortness of breath. Negative for cough and chest tightness.   Cardiovascular:  Positive for leg swelling. Negative for chest pain.  Gastrointestinal:  Negative for abdominal pain, diarrhea, nausea and vomiting.  Musculoskeletal:  Negative for arthralgias.  Skin:  Negative for color change.  Neurological:  Positive for weakness (Generalized). Negative for  headaches.  Psychiatric/Behavioral:  Negative for confusion.     Updated Vital Signs BP 110/86   Pulse 80   Temp 98.8 F (37.1 C) (Oral)   Resp 16   Ht 5' 11 (1.803 m)   Wt 120.9 kg   SpO2 97%   BMI 37.17 kg/m   Physical Exam Vitals reviewed.  Constitutional:      General: He is not in acute distress.    Appearance: He is not toxic-appearing.  HENT:     Mouth/Throat:     Mouth: Mucous membranes are moist.  Cardiovascular:     Rate and Rhythm: Normal rate and regular rhythm.     Pulses: Normal pulses.  Pulmonary:     Effort: Pulmonary effort is normal. No respiratory distress.     Breath sounds: Normal breath sounds.  Abdominal:     General: There is no distension.     Palpations: Abdomen is soft.     Tenderness: There is no abdominal tenderness.  Musculoskeletal:     Left lower leg: Edema present.     Comments: 2+ pitting edema left lower extremity, has right BKA and amputation of toes to left foot  Skin:    General: Skin is warm.     Capillary Refill: Capillary refill takes less than 2 seconds.  Neurological:     General: No focal deficit present.     Mental Status: He is alert.     Sensory: No sensory deficit.     Motor: No weakness.     (all labs ordered are listed, but only abnormal results are displayed) Labs Reviewed  CBC WITH DIFFERENTIAL/PLATELET - Abnormal; Notable for the following components:      Result Value   RBC 3.83 (*)    Hemoglobin 10.2 (*)    HCT 32.1 (*)    All other components within normal limits  COMPREHENSIVE METABOLIC PANEL WITH GFR - Abnormal; Notable for the following components:   Glucose, Bld 69 (*)    BUN 35 (*)    Creatinine, Ser 1.32 (*)    Calcium  8.4 (*)    Total Protein 6.3 (*)    Albumin  3.4 (*)    All other components within normal limits  BRAIN NATRIURETIC PEPTIDE - Abnormal; Notable for the following components:   B Natriuretic Peptide 862.0 (*)    All other components within normal limits  CBG MONITORING, ED   TROPONIN I (HIGH SENSITIVITY)    EKG: EKG Interpretation Date/Time:  Tuesday April 16 2024 18:57:40 EDT Ventricular Rate:  81 PR Interval:  202 QRS Duration:  150 QT Interval:  419 QTC Calculation: 487 R Axis:   -47  Text Interpretation: Sinus rhythm Right bundle branch block Inferior infarct, old Confirmed by Suzette Pac [id number]) on 04/16/2024 9:35:37 PM  Radiology: [facility] Chest Portable 1 View Result Date: 04/16/2024 CLINICAL DATA:  Weakness. EXAM: PORTABLE CHEST 1 VIEW COMPARISON:  April 14, 2024 FINDINGS: The cardiac silhouette is mildly enlarged and unchanged in size. Low lung volumes are noted.  Mild linear atelectasis is seen within the bilateral lung bases. Small bilateral pleural effusions are suspected. No pneumothorax is identified. The visualized skeletal structures are unremarkable. IMPRESSION: 1. Stable cardiomegaly and low lung volumes with mild bibasilar linear atelectasis. 2. Small bilateral pleural effusions. Electronically Signed   By: Suzen Dials M.D.   On: 04/16/2024 19:19     Procedures   Medications Ordered in the ED  furosemide  (LASIX ) injection 40 mg (has no administration in time range)                                    Medical Decision Making Patient here with continued lower extremity edema, fatigue and exertional dyspnea.  Seen here 2 days ago for same not improving despite at home Lasix  daily.  Denies any chest pain fever or chills or cough.  No abdominal pain   I suspect symptoms are related to CHF exacerbation, ACS, PE dissection also considered but felt less likely.  Patient did have CTA of the chest on prior visit without evidence of PE  Amount and/or Complexity of Data Reviewed Labs: ordered.    Details: Labs, no evidence of leukocytosis, kidney functions appear baseline his BNP is elevated at 862 similar to 2 days ago.  Troponin elevated but near baseline.  No chest pain Radiology: ordered.    Details: Chest x-ray shows small bilateral  pleural effusions ECG/medicine tests: ordered.    Details: EKG shows sinus rhythm with right bundle branch block Discussion of management or test interpretation with external provider(s): IV Lasix  given here, plan to admit for CHF exacerbation.  On recheck, patient resting comfortably.  Vital signs reassuring.   Discussed with hospitalist, Dr. Manfred who agrees to admit     Risk Prescription drug management. Decision regarding hospitalization.        Final diagnoses:  Acute on chronic congestive heart failure, unspecified heart failure type [facility]    ED Discharge Orders     None          Herlinda Madelin RIGGERS 04/16/24 2224    Suzette Pac, MD 04/17/24 1154

## 2024-04-16 NOTE — ED Notes (Signed)
Pt given turkey sandwich and sprite.  

## 2024-04-16 NOTE — H&P (Incomplete)
 History and Physical    Patient: Albert [name] FMW:[id number] DOB: [birth year] DOA: 04/16/2024 DOS: the patient was seen and examined on 04/17/2024 PCP: [name], MD  Patient coming from: Home  Chief Complaint:  Chief Complaint  Patient presents with   Edema   HPI: Albert [name] is a 63 y.o. male with medical history significant of T2DM, hypertension, hyperlipidemia, CAD, CHF PVD, right BKA, left transmetatarsal amputation, prior stroke with some right arm weakness who presents to the emergency department due to 10 days of worsening left lower extremity edema, generalized weakness and fatigue.  He presented to the emergency department on Sunday 6/29 for similar symptoms where he was treated with IV Lasix  80 milligram x 1 and patient was discharged home.  Patient states that he has been compliant with his Lasix  (80 mg daily), but instead, he continues to have increased swelling of LLE and worsening shortness of breath on exertion. Patient endorsed oncoming follow-up with his cardiologist for an echo and carotid study later in the month.  ED Course:  In the emergency department, he was hemodynamically stable, though BP was soft at 98/74.  Workup in the ED showed normocytic anemia, BMP was normal except for blood glucose of 69, urine/creatinine/1.32 (creatinine is within baseline range), albumin  3.4, BNP 862, troponin 37 > 38. Chest x-ray showed stable cardiomegaly and low lung volumes with mild bibasilar linear atelectasis. Small bilateral pleural effusions. He was treated with IV Lasix  40 mg x 1.  TRH was asked to admit patient   Review of Systems: Review of systems as noted in the HPI. All other systems reviewed and are negative.   Past Medical History:  Diagnosis Date   Allergic rhinitis    Anemia    takes supplement   Chronic cough    Chronic sinusitis    Complication of anesthesia    couldn't swallow and talk   Coronary artery disease    CVA (cerebral infarction) 2011   R  hand deficit   Diabetes mellitus without complication (HCC) ~2000   last HbA1c ~9 .... fasting 160s   GERD (gastroesophageal reflux disease)    Heart murmur    Hypercholesteremia    Hypertension    Myocardial infarction (HCC) 2014   Osteomyelitis (HCC)    first and third metatarsal   Pneumonia    hx of walking   Splenic infarct    setting of lupus anticoagulant   Stroke [facility]) 2011   right arm weakness   Past Surgical History:  Procedure Laterality Date   4th Toe Amputation Left Jan. 2014   4th toe in [place]   AMPUTATION Left 12/08/2012   Procedure: AMPUTATION DIGIT;  Surgeon: Lonni CINDERELLA Poli, MD;  Location: [facility] OR;  Service: Orthopedics;  Laterality: Left;  3rd toe amputation possible 5th toe amputation   AMPUTATION Left 12/11/2012   Procedure: Amputation of 2nd Toe;  Surgeon: Lonni CINDERELLA Poli, MD;  Location: [facility] OR;  Service: Orthopedics;  Laterality: Left;   AMPUTATION Right 06/29/2013   Procedure: AMPUTATION BELOW KNEE;  Surgeon: Jerona LULLA Sage, MD;  Location: [facility] OR;  Service: Orthopedics;  Laterality: Right;   AMPUTATION Left 08/29/2014   Procedure: Left Great Toe Amputation at MTP;  Surgeon: Jerona Sage LULLA, MD;  Location: [facility] OR;  Service: Orthopedics;  Laterality: Left;   AMPUTATION Left 10/31/2014   Procedure: Midfoot Amputation;  Surgeon: Jerona Sage LULLA, MD;  Location: [facility] OR;  Service: Orthopedics;  Laterality: Left;   BYPASS GRAFT POPLITEAL TO  TIBIAL Left 12/13/2012   Procedure: BYPASS GRAFT POPLITEAL TO TIBIAL;  Surgeon: Gaile LELON New, MD;  Location: [facility] OR;  Service: Vascular;  Laterality: Left;  Left Popliteal to Posterior Tibial Bypass Graft with reversed saphenous vein graft   CARDIAC CATHETERIZATION     COLONOSCOPY [name] 04/14/2017   Procedure: COLONOSCOPY;  Surgeon: Harvey Margo CROME, MD;  Location: [facility] ENDO SUITE;  Service: Endoscopy;  Laterality: [name];  1000   COLONOSCOPY WITH PROPOFOL  [name] 05/01/2023   Procedure: COLONOSCOPY WITH PROPOFOL ;  Surgeon: Cindie Carlin POUR,  DO;  Location: [facility] ENDO SUITE;  Service: Endoscopy;  Laterality: [name];  10:00 am, asa 3   I & D EXTREMITY Left 12/08/2012   Procedure: IRRIGATION AND DEBRIDEMENT EXTREMITY;  Surgeon: Lonni CINDERELLA Poli, MD;  Location: [facility] OR;  Service: Orthopedics;  Laterality: Left;   I & D EXTREMITY Left 12/11/2012   Procedure: REPEAT I&D LEFT FOOT;  Surgeon: Lonni CINDERELLA Poli, MD;  Location: [facility] OR;  Service: Orthopedics;  Laterality: Left;   INCISION AND DRAINAGE Right 06/25/2013   Procedure: INCISION AND DRAINAGE RIGHT FOOT;  Surgeon: Lonni CINDERELLA Poli, MD;  Location: [facility] ORS;  Service: Orthopedics;  Laterality: Right;   LEFT HEART CATHETERIZATION WITH CORONARY ANGIOGRAM [name] 06/28/2013   Procedure: LEFT HEART CATHETERIZATION WITH CORONARY ANGIOGRAM;  Surgeon: Lonni JONETTA Cash, MD;  Location: [facility] CATH LAB;  Service: Cardiovascular;  Laterality: [name];   MASS EXCISION [name] 11/15/2017   Procedure: EXCISION 4CM CYST ON NECK;  Surgeon: Kallie Manuelita BROCKS, MD;  Location: [facility] ORS;  Service: General;  Laterality: [name];   PENILE PROSTHESIS IMPLANT     POLYPECTOMY  04/14/2017   Procedure: POLYPECTOMY;  Surgeon: Harvey Margo CROME, MD;  Location: [facility]ITE;  Service: Endoscopy;;  colon   POLYPECTOMY  05/01/2023   Procedure: POLYPECTOMY;  Surgeon: Cindie Carlin POUR, DO;  Location: [facility] END[facility] SUITE;  Service: Endoscopy;;   STUMP REVISION Right 08/16/2013   Procedure: STUMP REVISION- right Below Knee Amputation;  Surgeon: Jerona LULLA Sage, MD;  Location: [facility] OR;  Service: Orthopedics;  Laterality: Right;   TEE WITHOUT CARDIOVERSION [name] 07/03/2013   Procedure: TRANSESOPHAGEAL ECHOCARDIOGRAM (TEE);  Surgeon: Ezra GORMAN Shuck, MD;  Location: [facility] ENDOSCOPY;  Service: Cardiovascular;  Laterality: [name];   TONSILLECTOMY AND ADENOIDECTOMY      Social History:  reports that he has never smoked. He has never used smokeless tobacco. He reports that he does not drink alcohol and does not use drugs.   Allergies  Allergen Reactions    Amoxicillin Nausea And Vomiting         Family History  Problem Relation Age of Onset   Pancreatic cancer Mother    Diabetes Mother    Hyperlipidemia Mother    Breast cancer Sister    Depression Maternal Grandmother    Heart disease Maternal Grandmother    Depression Maternal Grandfather    Heart disease Maternal Grandfather    Depression Paternal Grandmother    Heart disease Paternal Grandmother    Depression Paternal Grandfather    Heart disease Paternal Grandfather    Diabetes Other    Colon cancer Neg Hx      Prior to Admission medications   Medication Sig Start Date End Date Taking? Authorizing Provider  ACCU-CHEK GUIDE TEST test strip SMARTSIG:Strip(s) 02/23/24   [provider]  aspirin  EC 81 MG tablet Take 1 tablet (81 mg total) by mouth daily. 01/09/14   Antoinette Doe, MD  atorvastatin  (LIPITOR ) 40 MG tablet Take 40 mg by  mouth daily. 04/13/24   [provider]  carvedilol  (COREG ) 25 MG tablet Take 25 mg by mouth 2 (two) times daily.     [provider]  clotrimazole -betamethasone  (LOTRISONE ) cream Apply 1 application topically 2 (two) times daily. Patient taking differently: Apply 1 application  topically daily as needed (rash). 06/17/21   Watt Rush, MD  furosemide  (LASIX ) 80 MG tablet Take 80 mg by mouth daily. 08/08/13   Angiulli, Toribio PARAS, PA-C  gabapentin (NEURONTIN) 300 MG capsule Take 300 mg by mouth 3 (three) times daily as needed (pain). Take 1 capsule 3 times a day by oral route as needed. 11/01/22   [provider]  hydrALAZINE  (APRESOLINE ) 10 MG tablet Take 1 tablet by mouth 2 (two) times daily. 09/11/22   [provider]  insulin  lispro (HUMALOG) 100 UNIT/ML KwikPen Inject 20 Units into the skin 3 (three) times daily before meals. Patient taking differently: Inject 25 Units into the skin 3 (three) times daily before meals.    [provider]  losartan  (COZAAR ) 50 MG tablet TAKE 1 TABLET BY MOUTH  DAILY  12/03/18   Alvan Dorn FALCON, MD  metFORMIN (GLUCOPHAGE-XR) 500 MG 24 hr tablet Take 500 mg by mouth in the morning and at bedtime. 01/29/19   [provider]  Multiple Vitamins-Minerals (MENS 50+ MULTIVITAMIN) TABS Take 1 tablet by mouth daily.    [provider]  nitroGLYCERIN  (NITRODUR - DOSED IN MG/24 HR) 0.2 mg/hr patch APPLY 1 PATCH ONTO SKIN  DAILY Patient taking differently: Place 0.2 mg onto the skin daily as needed (promote blood flow). Apply to left foot 10/28/21   Barbarann Oneil BROCKS, MD  OZEMPIC, 1 MG/DOSE, 4 MG/3ML SOPN Inject 1 mg into the skin once a week. 04/06/24   [provider]  pentoxifylline  (TRENTAL ) 400 MG CR tablet Take 1 tablet (400 mg total) by mouth 3 (three) times daily with meals. Patient not taking: Reported on 04/03/2024 03/01/19   Rayburn, Elouise Phlegm, PA-C  potassium chloride  (K-DUR,KLOR-CON ) 10 MEQ tablet Take 1 tablet (10 mEq total) by mouth daily. 04/08/15   Alvan Dorn FALCON, MD  TRESIBA FLEXTOUCH 100 UNIT/ML SOPN FlexTouch Pen Inject 57 Units into the skin at bedtime. 07/23/18   [provider]    Physical Exam: BP 119/71 (BP Location: Left Arm)   Pulse 89   Temp 98.1 F (36.7 C) (Oral)   Resp 17   Ht 5' 11 (1.803 m)   Wt 132.2 kg   SpO2 100%   BMI 40.65 kg/m   General: 63 y.o. year-old male well developed well nourished in no acute distress.  Alert and oriented x3. HEENT: NCAT, EOMI Neck: Supple, trachea medial Cardiovascular: Regular rate and rhythm with no rubs or gallops.  No thyromegaly or JVD noted.  2+ left lower extremity edema. 2/4 pulses in all 4 extremities. Respiratory: Clear to auscultation with no wheezes or rales. Good inspiratory effort. Abdomen: Soft, nontender nondistended with normal bowel sounds x4 quadrants. Muskuloskeletal: Right BKA.  Left transmetatarsal amputation.  Neuro: CN II-XII intact, sensation, reflexes intact Skin: No ulcerative lesions noted or rashes Psychiatry: Judgement and  insight appear normal. Mood is appropriate for condition and setting          Labs on Admission:  Basic Metabolic Panel: Recent Labs  Lab 04/14/24 0903 04/16/24 1923  NA 138 137  K 4.1 3.7  CL 104 103  CO2 21* 23  GLUCOSE 157* 69*  BUN 32* 35*  CREATININE 1.50* 1.32*  CALCIUM  8.7* 8.4*   Liver Function Tests: Recent Labs  Lab 04/14/24 0903 04/16/24 1923  AST 15 21  ALT 35 36  ALKPHOS 66 62  BILITOT 0.9 0.9  PROT 6.8 6.3*  ALBUMIN  3.7 3.4*   No results for input(s): LIPASE, AMYLASE in the last 168 hours. No results for input(s): AMMONIA in the last 168 hours. CBC: Recent Labs  Lab 04/14/24 0903 04/16/24 1923  WBC 10.7* 8.3  NEUTROABS 8.0* 5.5  HGB 11.0* 10.2*  HCT 34.3* 32.1*  MCV 84.5 83.8  PLT 340 292   Cardiac Enzymes: No results for input(s): CKTOTAL, CKMB, CKMBINDEX, TROPONINI in the last 168 hours.  BNP (last 3 results) Recent Labs    04/14/24 0904 04/16/24 1923  BNP 859.0* 862.0*    ProBNP (last 3 results) No results for input(s): PROBNP in the last 8760 hours.  CBG: Recent Labs  Lab 04/16/24 2002 04/16/24 2320  GLUCAP 70 112*    Radiological Exams on Admission: DG Chest Portable 1 View Result Date: 04/16/2024 CLINICAL DATA:  Weakness. EXAM: PORTABLE CHEST 1 VIEW COMPARISON:  April 14, 2024 FINDINGS: The cardiac silhouette is mildly enlarged and unchanged in size. Low lung volumes are noted. Mild linear atelectasis is seen within the bilateral lung bases. Small bilateral pleural effusions are suspected. No pneumothorax is identified. The visualized skeletal structures are unremarkable. IMPRESSION: 1. Stable cardiomegaly and low lung volumes with mild bibasilar linear atelectasis. 2. Small bilateral pleural effusions. Electronically Signed   By: Suzen Dials M.D.   On: 04/16/2024 19:19    EKG: I independently viewed the EKG done and my findings are as followed: Sinus tachycardia at a rate of 101 bpm with  RBBB  Assessment/Plan Present on Admission:  Essential hypertension, benign  Coronary atherosclerosis of native coronary artery  Peripheral vascular disease, unspecified (HCC)  Principal Problem:   Acute on chronic diastolic CHF (congestive heart failure) (HCC) Active Problems:   Essential hypertension, benign   Peripheral vascular disease, unspecified (HCC)   Coronary atherosclerosis of native coronary artery   Elevated brain natriuretic peptide (BNP) level   Bilateral pleural effusion   Type 2 diabetes mellitus with hypoglycemia (HCC)   Hypoalbuminemia due to protein-calorie malnutrition (HCC)   Obesity, Class III, BMI 40-49.9 (morbid obesity)   Mixed hyperlipidemia  Acute on chronic diastolic CHF Elevated BNP BNP 137 Continue total input/output, daily weights and fluid restriction Continue IV Lasix  40 mg twice daily Continue heart healthy/carb modified diet  Echocardiogram done in 01/2017 showed LVEF of 50% and G1 DD.  Echocardiogram in the morning   Bilateral pleural effusion Chest x-ray shows small bilateral pleural effusion Continue IV Lasix  40 mg twice daily  Elevated troponin possibly secondary to type II demand ischemia Troponin 37 > 38; patient denies chest pain, continue to trend troponin  T2DM with hypoglycemia Continue CBG every 4 hour Continue hypoglycemia protocol and consider starting patient on ISS when blood Glucose reaches hyperglycemic range  Hypoalbuminemia possibly secondary to mild protein calorie malnutrition Albumin  3.4, protein supplement will be provided  Morbid obesity (BMI of 40.65) Diet and lifestyle modification Patient may need to follow-up with outpatient PCP for weight loss  Essential hypertension Continue Lasix , losartan   Mixed hyperlipidemia Continue Lipitor   CAD/PVD Continue aspirin , Lipitor   DVT prophylaxis: Lovenox   Code Status: Full code  Family Communication: None at bedside  Consults: None  Severity of  Illness: The appropriate patient status for this patient is INPATIENT. Inpatient status is judged to be reasonable and necessary in  order to provide the required intensity of service to ensure the patient's safety. The patient's presenting symptoms, physical exam findings, and initial radiographic and laboratory data in the context of their chronic comorbidities is felt to place them at high risk for further clinical deterioration. Furthermore, it is not anticipated that the patient will be medically stable for discharge from the hospital within 2 midnights of admission.   * I certify that at the point of admission it is my clinical judgment that the patient will require inpatient hospital care spanning beyond 2 midnights from the point of admission due to high intensity of service, high risk for further deterioration and high frequency of surveillance required.*  Author: Dominque Marlin, DO 04/17/2024 2:06 AM  For on call review [email or web address].

## 2024-04-17 ENCOUNTER — Encounter (HOSPITAL_COMMUNITY): Payer: Self-pay | Admitting: Internal Medicine

## 2024-04-17 ENCOUNTER — Other Ambulatory Visit (HOSPITAL_COMMUNITY): Payer: Self-pay | Admitting: *Deleted

## 2024-04-17 ENCOUNTER — Inpatient Hospital Stay (HOSPITAL_COMMUNITY)

## 2024-04-17 DIAGNOSIS — E782 Mixed hyperlipidemia: Secondary | ICD-10-CM | POA: Insufficient documentation

## 2024-04-17 DIAGNOSIS — E8809 Other disorders of plasma-protein metabolism, not elsewhere classified: Secondary | ICD-10-CM | POA: Insufficient documentation

## 2024-04-17 DIAGNOSIS — I5031 Acute diastolic (congestive) heart failure: Secondary | ICD-10-CM | POA: Diagnosis not present

## 2024-04-17 DIAGNOSIS — I10 Essential (primary) hypertension: Secondary | ICD-10-CM | POA: Diagnosis not present

## 2024-04-17 DIAGNOSIS — E11649 Type 2 diabetes mellitus with hypoglycemia without coma: Secondary | ICD-10-CM | POA: Insufficient documentation

## 2024-04-17 DIAGNOSIS — E66813 Obesity, class 3: Secondary | ICD-10-CM | POA: Insufficient documentation

## 2024-04-17 DIAGNOSIS — I5033 Acute on chronic diastolic (congestive) heart failure: Secondary | ICD-10-CM

## 2024-04-17 DIAGNOSIS — I5023 Acute on chronic systolic (congestive) heart failure: Secondary | ICD-10-CM

## 2024-04-17 DIAGNOSIS — R7989 Other specified abnormal findings of blood chemistry: Secondary | ICD-10-CM | POA: Insufficient documentation

## 2024-04-17 DIAGNOSIS — J90 Pleural effusion, not elsewhere classified: Secondary | ICD-10-CM | POA: Insufficient documentation

## 2024-04-17 LAB — COMPREHENSIVE METABOLIC PANEL WITH GFR
ALT: 38 U/L (ref 0–44)
AST: 22 U/L (ref 15–41)
Albumin: 3.5 g/dL (ref 3.5–5.0)
Alkaline Phosphatase: 60 U/L (ref 38–126)
Anion gap: 12 (ref 5–15)
BUN: 35 mg/dL — ABNORMAL HIGH (ref 8–23)
CO2: 24 mmol/L (ref 22–32)
Calcium: 8.7 mg/dL — ABNORMAL LOW (ref 8.9–10.3)
Chloride: 104 mmol/L (ref 98–111)
Creatinine, Ser: 1.28 mg/dL — ABNORMAL HIGH (ref 0.61–1.24)
GFR, Estimated: >60 mL/min (ref >60)
Glucose, Bld: 113 mg/dL — ABNORMAL HIGH (ref 70–99)
Potassium: 3.8 mmol/L (ref 3.5–5.1)
Sodium: 140 mmol/L (ref 135–145)
Total Bilirubin: 0.6 mg/dL (ref 0.0–1.2)
Total Protein: 6.5 g/dL (ref 6.5–8.1)

## 2024-04-17 LAB — GLUCOSE, CAPILLARY
Glucose-Capillary: 129 mg/dL — ABNORMAL HIGH (ref 70–99)
Glucose-Capillary: 103 mg/dL — ABNORMAL HIGH (ref 70–99)
Glucose-Capillary: 208 mg/dL — ABNORMAL HIGH (ref 70–99)
Glucose-Capillary: 194 mg/dL — ABNORMAL HIGH (ref 70–99)
Glucose-Capillary: 301 mg/dL — ABNORMAL HIGH (ref 70–99)

## 2024-04-17 LAB — TROPONIN I (HIGH SENSITIVITY): Troponin I (High Sensitivity): 38 ng/L — ABNORMAL HIGH (ref <18)

## 2024-04-17 LAB — CBC
HCT: 33.4 % — ABNORMAL LOW (ref 39.0–52.0)
Hemoglobin: 10.1 g/dL — ABNORMAL LOW (ref 13.0–17.0)
MCH: 25.6 pg — ABNORMAL LOW (ref 26.0–34.0)
MCHC: 30.2 g/dL (ref 30.0–36.0)
MCV: 84.6 fL (ref 80.0–100.0)
Platelets: 305 10*3/uL (ref 150–400)
RBC: 3.95 MIL/uL — ABNORMAL LOW (ref 4.22–5.81)
RDW: 14.1 % (ref 11.5–15.5)
WBC: 9.7 10*3/uL (ref 4.0–10.5)
nRBC: 0 % (ref 0.0–0.2)

## 2024-04-17 LAB — ECHOCARDIOGRAM COMPLETE
Area-P 1/2: 3.58 cm2
Height: 71 in
MV M vel: 4.56 m/s
MV Peak grad: 83.2 mmHg
MV VTI: 1.04 cm2
S' Lateral: 4.3 cm
Weight: 4663.17 [oz_av]

## 2024-04-17 LAB — MAGNESIUM: Magnesium: 2.2 mg/dL (ref 1.7–2.4)

## 2024-04-17 LAB — PHOSPHORUS: Phosphorus: 4.4 mg/dL (ref 2.5–4.6)

## 2024-04-17 LAB — HIV ANTIBODY (ROUTINE TESTING W REFLEX): HIV Screen 4th Generation wRfx: NONREACTIVE

## 2024-04-17 MED ORDER — ONDANSETRON HCL 4 MG PO TABS: 4.0000 mg | ORAL_TABLET | Freq: Four times a day (QID) | ORAL | Status: DC | PRN

## 2024-04-17 MED ORDER — ATORVASTATIN CALCIUM 40 MG PO TABS
40.0000 mg | ORAL_TABLET | Freq: Every day | ORAL | Status: DC
  Administered 2024-04-17 – 2024-04-20 (×4): 40 mg via ORAL
  Filled 2024-04-17 (×4): qty 1

## 2024-04-17 MED ORDER — FUROSEMIDE 10 MG/ML IJ SOLN
40.0000 mg | Freq: Two times a day (BID) | INTRAMUSCULAR | Status: DC
  Administered 2024-04-17 – 2024-04-20 (×7): 40 mg via INTRAVENOUS
  Filled 2024-04-17 (×7): qty 4

## 2024-04-17 MED ORDER — GLUCERNA SHAKE PO LIQD
237.0000 mL | Freq: Three times a day (TID) | ORAL | Status: DC
  Administered 2024-04-17 – 2024-04-18 (×4): 237 mL via ORAL

## 2024-04-17 MED ORDER — TRAZODONE HCL 50 MG PO TABS
25.0000 mg | ORAL_TABLET | Freq: Every evening | ORAL | Status: DC | PRN
  Administered 2024-04-17 – 2024-04-19 (×3): 25 mg via ORAL
  Filled 2024-04-17 (×3): qty 1

## 2024-04-17 MED ORDER — ACETAMINOPHEN 325 MG PO TABS: 650.0000 mg | ORAL_TABLET | Freq: Four times a day (QID) | ORAL | Status: DC | PRN

## 2024-04-17 MED ORDER — ENOXAPARIN SODIUM 60 MG/0.6ML IJ SOSY
60.0000 mg | PREFILLED_SYRINGE | INTRAMUSCULAR | Status: DC
  Administered 2024-04-17 – 2024-04-20 (×4): 60 mg via SUBCUTANEOUS
  Filled 2024-04-17 (×4): qty 0.6

## 2024-04-17 MED ORDER — ONDANSETRON HCL 4 MG/2ML IJ SOLN: 4.0000 mg | Freq: Four times a day (QID) | INTRAMUSCULAR | Status: DC | PRN

## 2024-04-17 MED ORDER — ASPIRIN 81 MG PO TBEC
81.0000 mg | DELAYED_RELEASE_TABLET | Freq: Every day | ORAL | Status: DC
Start: 2024-04-17 — End: 2024-04-20
  Administered 2024-04-17 – 2024-04-20 (×4): 81 mg via ORAL
  Filled 2024-04-17 (×4): qty 1

## 2024-04-17 MED ORDER — PERFLUTREN LIPID MICROSPHERE
1.0000 mL | INTRAVENOUS | Status: AC | PRN
  Administered 2024-04-17: 3 mL via INTRAVENOUS

## 2024-04-17 MED ORDER — ACETAMINOPHEN 650 MG RE SUPP: 650.0000 mg | Freq: Four times a day (QID) | RECTAL | Status: DC | PRN

## 2024-04-17 MED ORDER — LOSARTAN POTASSIUM 50 MG PO TABS
50.0000 mg | ORAL_TABLET | Freq: Every day | ORAL | Status: DC
  Administered 2024-04-17 – 2024-04-18 (×2): 50 mg via ORAL
  Filled 2024-04-17 (×2): qty 1

## 2024-04-17 NOTE — Progress Notes (Signed)
*  PRELIMINARY RESULTS* Echocardiogram 2D Echocardiogram has been performed with Definity .  Teresa Aida PARAS 04/17/2024, 2:51 PM

## 2024-04-17 NOTE — Plan of Care (Signed)
  Problem: Clinical Measurements: Goal: Diagnostic test results will improve Outcome: Progressing   Problem: Activity: Goal: Risk for activity intolerance will decrease Outcome: Progressing   Problem: Pain Managment: Goal: General experience of comfort will improve and/or be controlled Outcome: Progressing

## 2024-04-17 NOTE — TOC CM/SW Note (Signed)
 Transition of Care [facility]) - Inpatient Brief Assessment   Patient Details  Name: Albert [name] MRN: [id number] Date of Birth: 12/04/1960  Transition of Care [facility]) CM/SW Contact:    Lucie Lunger, LCSWA Phone Number: 04/17/2024, 10:33 AM   Clinical Narrative: Transition of Care Department [facility]) has reviewed patient and no TOC needs have been identified at this time. We will continue to monitor patient advancement through interdiciplinary progression rounds. If new patient transition needs arise, please place a TOC consult.  Transition of Care Asessment: Insurance and Status: Insurance coverage has been reviewed Patient has primary care physician: Yes Home environment has been reviewed: From home Prior level of function:: Independent Prior/Current Home Services: No current home services Social Drivers of Health Review: SDOH reviewed no interventions necessary Readmission risk has been reviewed: Yes Transition of care needs: no transition of care needs at this time

## 2024-04-17 NOTE — Progress Notes (Signed)
 Progress Note   Patient: Albert [name] FMW:[id number] DOB: 1961-06-08 DOA: 04/16/2024     1 DOS: the patient was seen and examined on 04/17/2024   Brief hospital admission narrative: As per H&P written by Dr. Manfred on 04/16/2024 Albert [name] is a 63 y.o. male with medical history significant of T2DM, hypertension, hyperlipidemia, CAD, CHF PVD, right BKA, left transmetatarsal amputation, prior stroke with some right arm weakness who presents to the emergency department due to 10 days of worsening left lower extremity edema, generalized weakness and fatigue.  He presented to the emergency department on Sunday 6/29 for similar symptoms where he was treated with IV Lasix  80 milligram x 1 and patient was discharged home.  Patient states that he has been compliant with his Lasix  (80 mg daily), but instead, he continues to have increased swelling of LLE and worsening shortness of breath on exertion. Patient endorsed oncoming follow-up with his cardiologist for an echo and carotid study later in the month.   ED Course:  In the emergency department, he was hemodynamically stable, though BP was soft at 98/74.  Workup in the ED showed normocytic anemia, BMP was normal except for blood glucose of 69, urine/creatinine/1.32 (creatinine is within baseline range), albumin  3.4, BNP 862, troponin 37 > 38. Chest x-ray showed stable cardiomegaly and low lung volumes with mild bibasilar linear atelectasis. Small bilateral pleural effusions. He was treated with IV Lasix  40 mg x 1.  TRH was asked to admit patient   Assessment and plan 1-acute on chronic CHF - Prior echo in 2018 demonstrating ejection fraction 50% with grade 1 diastolic dysfunction - Repeat echo with ejection fraction of 35% - Patient qualified for acute systolic heart failure - Continue IV diuresis - Continue to follow low-sodium diet, strict I's and O's and daily weights - Follow rate sleep measurements - Cardiology service consulted for  further assistance and recommendations regarding GDMT and the need for cardiac cath evaluation  2-bilateral pleural effusion - Appreciated on chest x-ray at time of admission; most likely associated with vascular congestion/CHF - Continue diuresis - No requiring oxygen  supplementation-patient already improving; will hold on thoracentesis.  3-elevated troponin - Flat with a level of 37 >>38; in the setting of demand ischemia from CHF most likely - Patient denies any chest pain - Continue diuresis - Follow cardiology service recommendation.  4-morbid obesity -Body mass index is 40.65 kg/m.  -Low-calorie diet, portion control and increased activity discussed with patient.  5-hypertension - Vital signs stable and well-controlled - Continue losartan  and IV diuresis  6-hyperlipidemia - Continue Lipitor   7-prior history of coronary artery disease/peripheral vascular disease - Status post right BKA - Continue aspirin  and statin - Patient reports no chest pain; shortness of breath most likely secondary to CHF exacerbation but questionable if there is or not any component of angina equivalent - Cardiology has been consulted and will follow any further recommendation.  Subjective:  No chest pain, no nausea, no vomiting; reports feeling better and expressed good urine output.  Physical Exam: Vitals:   04/17/24 0154 04/17/24 0350 04/17/24 0748 04/17/24 1256  BP:  119/88 (!) 121/91 (!) 133/94  Pulse:  92 93 98  Resp:  16 18 20   Temp:  98.4 F (36.9 C) 98.5 F (36.9 C) 98.4 F (36.9 C)  TempSrc:  Oral Oral Oral  SpO2: 96% 96% 100% 98%  Weight:  132.2 kg    Height:       General exam: Alert, awake, oriented x 3;  in no acute distress. Respiratory system: Good saturation on room air; decreased breath sounds at the bases appreciated. Cardiovascular system:RRR. No rubs or gallops; unable to properly assess JVD with body habitus Gastrointestinal system: Abdomen is obese, nondistended,  soft and nontender. No organomegaly or masses felt. Normal bowel sounds heard. Central nervous system:  No focal neurological deficits. Extremities: No cyanosis or clubbing; right BKA appreciated; left lower extremity with trace edema appreciated and transmetatarsal foot amputation. Skin: No petechiae. Psychiatry: Judgement and insight appear normal. Mood & affect appropriate.    Data Reviewed: Comprehensive metabolic panel: Sodium 140, potassium 3.8, chloride 104, bicarb 24, BUN 35, creatinine 1.28 and GFR >60.  Normal LFTs appreciated. CBC: WBCs 9.7, hemoglobin 10.1 and platelet counts 305K Magnesium : 2.2 Phosphorus: 4.4  Family Communication: No family at bedside.  Disposition: Status is: Inpatient Remains inpatient appropriate because: Continue IV therapy.  Anticipate discharge back home once medically stable.  Time spent: 50 minutes  Author: Eric Nunnery, MD 04/17/2024 7:00 PM  For on call review [email or web address].

## 2024-04-17 NOTE — Plan of Care (Signed)
   Problem: Education: Goal: Knowledge of General Education information will improve Description Including pain rating scale, medication(s)/side effects and non-pharmacologic comfort measures Outcome: Progressing

## 2024-04-18 ENCOUNTER — Other Ambulatory Visit (HOSPITAL_COMMUNITY): Payer: Self-pay

## 2024-04-18 ENCOUNTER — Telehealth (HOSPITAL_COMMUNITY): Payer: Self-pay

## 2024-04-18 ENCOUNTER — Encounter (HOSPITAL_COMMUNITY): Payer: Self-pay | Admitting: Internal Medicine

## 2024-04-18 DIAGNOSIS — I5023 Acute on chronic systolic (congestive) heart failure: Secondary | ICD-10-CM | POA: Diagnosis not present

## 2024-04-18 DIAGNOSIS — J90 Pleural effusion, not elsewhere classified: Secondary | ICD-10-CM | POA: Diagnosis not present

## 2024-04-18 DIAGNOSIS — N1832 Chronic kidney disease, stage 3b: Secondary | ICD-10-CM

## 2024-04-18 DIAGNOSIS — I255 Ischemic cardiomyopathy: Secondary | ICD-10-CM | POA: Diagnosis not present

## 2024-04-18 DIAGNOSIS — E785 Hyperlipidemia, unspecified: Secondary | ICD-10-CM | POA: Diagnosis not present

## 2024-04-18 DIAGNOSIS — I5021 Acute systolic (congestive) heart failure: Secondary | ICD-10-CM

## 2024-04-18 DIAGNOSIS — E782 Mixed hyperlipidemia: Secondary | ICD-10-CM | POA: Diagnosis not present

## 2024-04-18 DIAGNOSIS — I10 Essential (primary) hypertension: Secondary | ICD-10-CM | POA: Diagnosis not present

## 2024-04-18 LAB — GLUCOSE, CAPILLARY
Glucose-Capillary: 170 mg/dL — ABNORMAL HIGH (ref 70–99)
Glucose-Capillary: 188 mg/dL — ABNORMAL HIGH (ref 70–99)
Glucose-Capillary: 201 mg/dL — ABNORMAL HIGH (ref 70–99)
Glucose-Capillary: 224 mg/dL — ABNORMAL HIGH (ref 70–99)
Glucose-Capillary: 237 mg/dL — ABNORMAL HIGH (ref 70–99)
Glucose-Capillary: 256 mg/dL — ABNORMAL HIGH (ref 70–99)

## 2024-04-18 MED ORDER — DAPAGLIFLOZIN PROPANEDIOL 10 MG PO TABS
10.0000 mg | ORAL_TABLET | Freq: Every day | ORAL | Status: DC
  Administered 2024-04-18 – 2024-04-20 (×3): 10 mg via ORAL
  Filled 2024-04-18 (×3): qty 1

## 2024-04-18 MED ORDER — SACUBITRIL-VALSARTAN 24-26 MG PO TABS
1.0000 | ORAL_TABLET | Freq: Two times a day (BID) | ORAL | Status: DC
  Administered 2024-04-18 – 2024-04-20 (×5): 1 via ORAL
  Filled 2024-04-18 (×5): qty 1

## 2024-04-18 NOTE — Progress Notes (Signed)
 Progress Note   Patient: Albert [name] FMW:[id number] DOB: [birth year] DOA: 04/16/2024     2 DOS: the patient was seen and examined on 04/18/2024   Brief hospital admission narrative: As per H&P written by Dr. Manfred on 04/16/2024 Albert [name] is a 63 y.o. male with medical history significant of T2DM, hypertension, hyperlipidemia, CAD, CHF PVD, right BKA, left transmetatarsal amputation, prior stroke with some right arm weakness who presents to the emergency department due to 10 days of worsening left lower extremity edema, generalized weakness and fatigue.  He presented to the emergency department on Sunday 6/29 for similar symptoms where he was treated with IV Lasix  80 milligram x 1 and patient was discharged home.  Patient states that he has been compliant with his Lasix  (80 mg daily), but instead, he continues to have increased swelling of LLE and worsening shortness of breath on exertion. Patient endorsed oncoming follow-up with his cardiologist for an echo and carotid study later in the month.   ED Course:  In the emergency department, he was hemodynamically stable, though BP was soft at 98/74.  Workup in the ED showed normocytic anemia, BMP was normal except for blood glucose of 69, urine/creatinine/1.32 (creatinine is within baseline range), albumin  3.4, BNP 862, troponin 37 > 38. Chest x-ray showed stable cardiomegaly and low lung volumes with mild bibasilar linear atelectasis. Small bilateral pleural effusions. He was treated with IV Lasix  40 mg x 1.  TRH was asked to admit patient   Assessment and plan 1-acute on chronic CHF - Prior echo in 2018 demonstrating ejection fraction 50% with grade 1 diastolic dysfunction - Repeat echo with ejection fraction of 35% - Patient qualified for acute systolic heart failure - Continue IV diuresis - Continue to follow low-sodium diet, strict I's and O's and daily weights - Appreciate assistance and recommendation by cardiology service; follow  recommendations patient has been started on Entresto and Farxiga. - Plan is for further tweaks/medical management through GDMT and outpatient decision regarding cardiac catheterization down the road.  2-bilateral pleural effusion - Appreciated on chest x-ray at time of admission; most likely associated with vascular congestion/CHF - Continue diuresis - No requiring oxygen  supplementation-patient already improving; will continue to hold on thoracentesis.  3-elevated troponin - Flat with a level of 37 >>38; in the setting of demand ischemia from CHF most likely - Patient denies any chest pain - Continue diuresis and will follow any further cardiology service recommendations.  4-morbid obesity -Body mass index is 37.64 kg/m.  -Low-calorie diet, portion control and increased activity discussed with patient.  5-hypertension - Vital signs stable and well-controlled - Continue IV diuresis - Following cardiology recommendations patient started on Entresto. - Will follow blood pressure trend.  6-hyperlipidemia - Continue Lipitor  - Heart healthy diet discussed with patient.  7-prior history of coronary artery disease/peripheral vascular disease - Status post right BKA - Continue aspirin  and statin - Patient reports no chest pain; shortness of breath most likely secondary to CHF exacerbation but questionable if there is or not any component of angina equivalent. - Cardiology has been consulted and will follow any further recommendation. - For now plan will be for medical management and outpatient follow-up to determine cardiac cath timing.  Subjective:  No chest pain, no nausea, no vomiting and no abdominal pain; patient reports improvement in his breathing and good urine output.  Physical Exam: Vitals:   04/17/24 1256 04/17/24 2013 04/18/24 0408 04/18/24 1234  BP: (!) 133/94 122/84 127/89 130/77  Pulse:  98 95 91 99  Resp: 20 18 18    Temp: 98.4 F (36.9 C) 97.7 F (36.5 C) 97.7 F  (36.5 C) (!) 97.4 F (36.3 C)  TempSrc: Oral Oral Oral Oral  SpO2: 98% 97% 98% 98%  Weight:   122.4 kg   Height:       General exam: Alert, awake, oriented x 3; reporting no chest pain, improvement in his respiratory status and increased urine output. Respiratory system: No requiring oxygen  supplementation; no using accessory muscle.  Good saturation on room air. Cardiovascular system:RRR.  No rubs, no gallops; unable to assess JVD with body habitus. Gastrointestinal system: Abdomen is obese, nondistended, soft and nontender.  Positive bowel sounds. Central nervous system: No focal neurological deficits. Extremities: No cyanosis or clubbing; right BKA appreciated on exam.  Left lower extremity with trace edema and well-healed transmetatarsal foot amputation. Skin: No petechiae. Psychiatry: Judgement and insight appear normal. Mood & affect appropriate.   Latest data Reviewed: Comprehensive metabolic panel: Sodium 140, potassium 3.8, chloride 104, bicarb 24, BUN 35, creatinine 1.28 and GFR >60.  Normal LFTs appreciated. CBC: WBCs 9.7, hemoglobin 10.1 and platelet counts 305K Magnesium : 2.2 Phosphorus: 4.4  Family Communication: No family at bedside.  Disposition: Status is: Inpatient Remains inpatient appropriate because: Continue IV therapy.  Anticipate discharge back home once medically stable.  Time spent: 50 minutes  Author: Eric Nunnery, MD 04/18/2024 5:52 PM  For on call review [email or web address].

## 2024-04-18 NOTE — Consult Note (Signed)
 CARDIOLOGY CONSULTATION  Patient ID: Albert [name]; [id number]; 15-Aug-1961   Admit date: 04/16/2024 Date of Consult: 04/18/2024  Primary Care Provider: [name], MD Primary Cardiologist: Alvan Carrier, MD  HISTORY OF PRESENT ILLNESS  [name] is a 63 y.o. male with past medical history outlined below currently admitted for further evaluation of increasing dyspnea on exertion and fatigue.  He states that within the last few weeks he has been more short of breath with routine activity, worse with the high heat, also developing fluid retention, mainly with left leg swelling.  NYHA class II-III symptoms.  He does not report any angina, no palpitations.  He saw Dr. Alvan in the office recently on June 18 for follow-up, note reviewed.  He has a history of multivessel CAD documented at remote cardiac catheterization in 2014, managed medically at that time.  LVEF was 50% by echocardiogram in 2018.  He does not weigh himself regularly at home, reports malfunctioning scale.  Uses a right lower leg prosthesis status post BKA.  Chest x-ray showed cardiomegaly with small bilateral pleural effusions.  BNP 859 with high-sensitivity troponin I levels in the low 30s in flat pattern not consistent with ACS.  He was seen in the ER on June 29, additional workup at that time included elevated D-dimer of 1.8 with chest CTA negative for pulmonary embolus but again showing pleural effusions as well as a 7 mm left lower lobe nodule.  He is feeling better with IV diuresis, approximately 3000 cc net output.  Follow-up echocardiogram during this hospital stay shows decrease in LVEF to the range of 30 to 35% with wall motion abnormalities consistent with ischemic cardiomyopathy, full report noted below.  ROS  Pertinent review in history of present illness.  No palpitations or syncope.  Past Medical History:  Diagnosis Date   Allergic rhinitis    Anemia    Chronic cough    Chronic sinusitis    Complication of  anesthesia    couldn't swallow and talk   Coronary artery disease    CVA (cerebral infarction) 2011   R hand deficit   Diabetes mellitus without complication (HCC) ~2000   last HbA1c ~9 .... fasting 160s   GERD (gastroesophageal reflux disease)    Heart murmur    Hypercholesteremia    Hypertension    Myocardial infarction (HCC) 2014   Osteomyelitis (HCC)    first and third metatarsal   Pneumonia    Splenic infarct    setting of lupus anticoagulant    Past Surgical History:  Procedure Laterality Date   4th Toe Amputation Left Jan. 2014   4th toe in [place]   AMPUTATION Left 12/08/2012   Procedure: AMPUTATION DIGIT;  Surgeon: Lonni CINDERELLA Poli, MD;  Location: [facility] OR;  Service: Orthopedics;  Laterality: Left;  3rd toe amputation possible 5th toe amputation   AMPUTATION Left 12/11/2012   Procedure: Amputation of 2nd Toe;  Surgeon: Lonni CINDERELLA Poli, MD;  Location: [facility] OR;  Service: Orthopedics;  Laterality: Left;   AMPUTATION Right 06/29/2013   Procedure: AMPUTATION BELOW KNEE;  Surgeon: Jerona LULLA Sage, MD;  Location: [facility] OR;  Service: Orthopedics;  Laterality: Right;   AMPUTATION Left 08/29/2014   Procedure: Left Great Toe Amputation at MTP;  Surgeon: Jerona Sage LULLA, MD;  Location: [facility] OR;  Service: Orthopedics;  Laterality: Left;   AMPUTATION Left 10/31/2014   Procedure: Midfoot Amputation;  Surgeon: Jerona Sage LULLA, MD;  Location: [facility] OR;  Service: Orthopedics;  Laterality: Left;  BYPASS GRAFT POPLITEAL TO TIBIAL Left 12/13/2012   Procedure: BYPASS GRAFT POPLITEAL TO TIBIAL;  Surgeon: Gaile LELON New, MD;  Location: [facility] OR;  Service: Vascular;  Laterality: Left;  Left Popliteal to Posterior Tibial Bypass Graft with reversed saphenous vein graft   CARDIAC CATHETERIZATION     COLONOSCOPY [name] 04/14/2017   Procedure: COLONOSCOPY;  Surgeon: Harvey Margo CROME, MD;  Location: [facility] SUITE;  Service: Endoscopy;  Laterality: [name];  1000   COLONOSCOPY WITH PROPOFOL  [name] 05/01/2023   Procedure:  COLONOSCOPY WITH PROPOFOL ;  Surgeon: Cindie Carlin POUR, DO;  Location: [facility] SUITE;  Service: Endoscopy;  Laterality: [name];  10:00 am, asa 3   I & D EXTREMITY Left 12/08/2012   Procedure: IRRIGATION AND DEBRIDEMENT EXTREMITY;  Surgeon: Lonni CINDERELLA Poli, MD;  Location: [facility] OR;  Service: Orthopedics;  Laterality: Left;   I & D EXTREMITY Left 12/11/2012   Procedure: REPEAT I&D LEFT FOOT;  Surgeon: Lonni CINDERELLA Poli, MD;  Location: [facility] OR;  Service: Orthopedics;  Laterality: Left;   INCISION AND DRAINAGE Right 06/25/2013   Procedure: INCISION AND DRAINAGE RIGHT FOOT;  Surgeon: Lonni CINDERELLA Poli, MD;  Location: [facility] ORS;  Service: Orthopedics;  Laterality: Right;   LEFT HEART CATHETERIZATION WITH CORONARY ANGIOGRAM [name] 06/28/2013   Procedure: LEFT HEART CATHETERIZATION WITH CORONARY ANGIOGRAM;  Surgeon: Lonni JONETTA Cash, MD;  Location: [facility] CATH LAB;  Service: Cardiovascular;  Laterality: [name];   MASS EXCISION [name] 11/15/2017   Procedure: EXCISION 4CM CYST ON NECK;  Surgeon: Kallie Manuelita BROCKS, MD;  Location: [facility] ORS;  Service: General;  Laterality: [name];   PENILE PROSTHESIS IMPLANT     POLYPECTOMY  04/14/2017   Procedure: POLYPECTOMY;  Surgeon: Harvey Margo CROME, MD;  Location: [facility];  Service: Endoscopy;;  colon   POLYPECTOMY  05/01/2023   Procedure: POLYPECTOMY;  Surgeon: Cindie Carlin POUR, DO;  Location: [facility] SUITE;  Service: Endoscopy;;   STUMP REVISION Right 08/16/2013   Procedure: STUMP REVISION- right Below Knee Amputation;  Surgeon: Jerona LULLA Sage, MD;  Location: [facility] OR;  Service: Orthopedics;  Laterality: Right;   TEE WITHOUT CARDIOVERSION [name] 07/03/2013   Procedure: TRANSESOPHAGEAL ECHOCARDIOGRAM (TEE);  Surgeon: Ezra GORMAN Shuck, MD;  Location: [facility] ENDOSCOPY;  Service: Cardiovascular;  Laterality: [name];   TONSILLECTOMY AND ADENOIDECTOMY       INPATIENT MEDICATIONS Scheduled Meds:  aspirin  EC  81 mg Oral Daily   atorvastatin   40 mg Oral Daily   enoxaparin  (LOVENOX ) injection   60 mg Subcutaneous Q24H   feeding supplement (GLUCERNA SHAKE)  237 mL Oral TID BM   furosemide   40 mg Intravenous Q12H   losartan   50 mg Oral Daily   Continuous Infusions:  PRN Meds: acetaminophen  **OR** acetaminophen , ondansetron  **OR** ondansetron  (ZOFRAN ) IV, traZODone   ALLERGIES Allergies  Allergen Reactions   Amoxicillin Nausea And Vomiting         SOCIAL HISTORY  Social History   Tobacco Use   Smoking status: Never   Smokeless tobacco: Never  Substance Use Topics   Alcohol use: No    Alcohol/week: 0.0 standard drinks of alcohol    FAMILY HISTORY   The patient's family history includes Breast cancer in his sister; Depression in his maternal grandfather, maternal grandmother, paternal grandfather, and paternal grandmother; Diabetes in his mother and another family member; Heart disease in his maternal grandfather, maternal grandmother, paternal grandfather, and paternal grandmother; Hyperlipidemia in his mother; Pancreatic cancer in his mother. There is no history of Colon cancer.  PHYSICAL EXAM &  DATA  Vitals:   04/17/24 0748 04/17/24 1256 04/17/24 2013 04/18/24 0408  BP: (!) 121/91 (!) 133/94 122/84 127/89  Pulse: 93 98 95 91  Resp: 18 20 18 18   Temp: 98.5 F (36.9 C) 98.4 F (36.9 C) 97.7 F (36.5 C) 97.7 F (36.5 C)  TempSrc: Oral Oral Oral Oral  SpO2: 100% 98% 97% 98%  Weight:    122.4 kg  Height:        Intake/Output Summary (Last 24 hours) at 04/18/2024 1006 Last data filed at 04/18/2024 0930 Gross per 24 hour  Intake 1200 ml  Output 2700 ml  Net -1500 ml   Filed Weights   04/16/24 2323 04/17/24 0350 04/18/24 0408  Weight: 132.2 kg 132.2 kg 122.4 kg   Body mass index is 37.64 kg/m.   Gen: Chronically ill-appearing male in no distress. HEENT: Conjunctiva and lids normal, oropharynx clear. Neck: Supple, no elevated JVP or carotid bruits. Lungs: Clear to auscultation, nonlabored breathing at rest. Cardiac: Regular rate and rhythm, no S3, soft  systolic murmur. Abdomen: Soft, nontender, bowel sounds present. Extremities: Status post right BKA, 1+ left leg edema. Skin: Warm and dry. Musculoskeletal: No kyphosis. Neuropsychiatric: Alert and oriented x3, affect grossly appropriate.  EKG:  An ECG dated 04/16/2024 was personally reviewed today and demonstrated:  Sinus rhythm with right bundle branch block and left anterior fascicular block, rule out old inferior infarct pattern.  Telemetry:  I personally reviewed telemetry which shows sinus rhythm.  RELEVANT CV STUDIES  Cardiac catheterization 07-09-2013: Hemodynamic Findings: Central aortic pressure: 90/61 Left ventricular pressure: 90/19/29   Angiographic Findings:   Left main: Short segment without obstruction.    Left Anterior Descending Artery: Moderate caliber diffusely diseased vessel that courses to the apex. The proximal vessel has diffuse 40% stenosis. The mid vessel has a 70% stenosis followed by a 60% stenosis. The distal vessel has diffuse 90% stenosis. The diagonal is a moderate caliber vessel with ostial 99% stenosis with 99% disease extending into the mid vessel.    Circumflex Artery: Large caliber vessel with proximal 80-90% stenosis. There are three small to moderate caliber obtuse marginal branches. The first OM has mid 80% stenosis. The second OM has a proximal 99% stenosis. The third OM has diffuse 99% stenosis. The left sided posterolateral branch has 99% stenosis.    Right Coronary Artery: Small non-dominant vessel with mid 99% stenosis.    Left Ventricular Angiogram: LVEF=40%  LABORATORY DATA  Chemistry Recent Labs  Lab 04/14/24 0903 04/16/24 1923 04/17/24 0427  NA 138 137 140  K 4.1 3.7 3.8  CL 104 103 104  CO2 21* 23 24  GLUCOSE 157* 69* 113*  BUN 32* 35* 35*  CREATININE 1.50* 1.32* 1.28*  CALCIUM  8.7* 8.4* 8.7*  GFRNONAA 53* >60 >60  ANIONGAP 13 11 12     Recent Labs  Lab 04/14/24 0903 04/16/24 1923 04/17/24 0427  PROT 6.8 6.3* 6.5   ALBUMIN  3.7 3.4* 3.5  AST 15 21 22   ALT 35 36 38  ALKPHOS 66 62 60  BILITOT 0.9 0.9 0.6   Hematology Recent Labs  Lab 04/14/24 0903 04/16/24 1923 04/17/24 0427  WBC 10.7* 8.3 9.7  RBC 4.06* 3.83* 3.95*  HGB 11.0* 10.2* 10.1*  HCT 34.3* 32.1* 33.4*  MCV 84.5 83.8 84.6  MCH 27.1 26.6 25.6*  MCHC 32.1 31.8 30.2  RDW 14.0 14.3 14.1  PLT 340 292 305   Cardiac Enzymes Recent Labs  Lab 04/14/24 0903 04/14/24 1045 04/16/24 1923 04/16/24 2331  TROPONINIHS 33* 33* 37* 38*   BNP Recent Labs  Lab 04/14/24 0904 04/16/24 1923  BNP 859.0* 862.0*     DDimer Recent Labs  Lab 04/14/24 0903  DDIMER 1.61*    Lipid Panel     Component Value Date/Time   CHOL 91 12/08/2012 0555   TRIG 73 12/08/2012 0555   HDL 22 (L) 12/08/2012 0555   CHOLHDL 4.1 12/08/2012 0555   VLDL 15 12/08/2012 0555   LDLCALC 54 12/08/2012 0555    RADIOLOGY/STUDIES ECHOCARDIOGRAM COMPLETE Result Date: 04/17/2024    ECHOCARDIOGRAM REPORT   Patient Name:   Albert [name] Date of Exam: 04/17/2024 Medical Rec #:  [id number]       Height:       71.0 in Accession #:    [id number]      Weight:       291.4 lb Date of Birth:  May 06, 1961       BSA:          2.474 m Patient Age:    63 years        BP:           121/91 mmHg Patient Gender: M               HR:           93 bpm. Exam Location:  [facility] Procedure: 2D Echo, Cardiac Doppler, Color Doppler and Intracardiac            Opacification Agent (Both Spectral and Color Flow Doppler were            utilized during procedure). Indications:    CHF- Acute Diastolic l50.31  History:        Patient has prior history of Echocardiogram examinations, most                 recent 01/26/2017. CHF and Cardiomyopathy, CAD; Risk                 Factors:Hypertension, Diabetes and Dyslipidemia.  Sonographer:    Aida Pizza RCS Referring Phys: [id number] OLADAPO ADEFESO IMPRESSIONS  1. Left ventricular ejection fraction, by estimation, is 30 to 35%. The left ventricle has moderately  decreased function. The left ventricle demonstrates regional wall motion abnormalities (see scoring diagram/findings for description). There is mild concentric left ventricular hypertrophy. Left ventricular diastolic parameters are indeterminate.  2. Right ventricular systolic function is normal. The right ventricular size is normal. There is moderately elevated pulmonary artery systolic pressure. The estimated right ventricular systolic pressure is 47.7 mmHg.  3. Small to moderate pericardial effusion. The pericardial effusion is posterior to the left ventricle. There is no evidence of cardiac tamponade. Trivial anterior collection as well.  4. The mitral valve is degenerative. Mild to moderate mitral valve regurgitation.  5. The aortic valve is tricuspid. Aortic valve regurgitation is not visualized. No aortic stenosis is present.  6. The inferior vena cava is dilated in size with <50% respiratory variability, suggesting right atrial pressure of 15 mmHg. Comparison(s): Prior images unable to be directly viewed. FINDINGS  Left Ventricle: Left ventricular ejection fraction, by estimation, is 30 to 35%. The left ventricle has moderately decreased function. The left ventricle demonstrates regional wall motion abnormalities. Definity  contrast agent was given IV to delineate the left ventricular endocardial borders. The left ventricular internal cavity size was normal in size. There is mild concentric left ventricular hypertrophy. Left ventricular diastolic parameters are indeterminate.  LV Wall Scoring: The mid and distal lateral wall,  apical anterior segment, apical inferior segment, and apex are akinetic. The anterior wall, antero-lateral wall, entire septum, inferior wall, and basal inferolateral segment are hypokinetic. Right Ventricle: The right ventricular size is normal. No increase in right ventricular wall thickness. Right ventricular systolic function is normal. There is moderately elevated pulmonary artery  systolic pressure. The tricuspid regurgitant velocity is 2.86 m/s, and with an assumed right atrial pressure of 15 mmHg, the estimated right ventricular systolic pressure is 47.7 mmHg. Left Atrium: Left atrial size was normal in size. Right Atrium: Right atrial size was normal in size. Pericardium: A moderately sized pericardial effusion is present. The pericardial effusion is posterior to the left ventricle. There is no evidence of cardiac tamponade. Mitral Valve: The mitral valve is degenerative in appearance. Mild to moderate mitral valve regurgitation. MV peak gradient, 7.4 mmHg. The mean mitral valve gradient is 2.0 mmHg. Tricuspid Valve: The tricuspid valve is grossly normal. Tricuspid valve regurgitation is mild. Aortic Valve: The aortic valve is tricuspid. There is mild aortic valve annular calcification. Aortic valve regurgitation is not visualized. No aortic stenosis is present. Pulmonic Valve: The pulmonic valve was grossly normal. Pulmonic valve regurgitation is trivial. Aorta: The aortic root is normal in size and structure. Venous: The inferior vena cava is dilated in size with less than 50% respiratory variability, suggesting right atrial pressure of 15 mmHg. IAS/Shunts: No atrial level shunt detected by color flow Doppler. Additional Comments: 3D was performed not requiring image post processing on an independent workstation and was indeterminate.  LEFT VENTRICLE PLAX 2D LVIDd:         5.40 cm LVIDs:         4.30 cm LV PW:         1.10 cm LV IVS:        1.10 cm LVOT diam:     2.00 cm LV SV:         33 LV SV Index:   13 LVOT Area:     3.14 cm  RIGHT VENTRICLE RV S prime:     7.68 cm/s TAPSE (M-mode): 2.0 cm LEFT ATRIUM             Index        RIGHT ATRIUM           Index LA diam:        4.30 cm 1.74 cm/m   RA Area:     22.80 cm LA Vol (A2C):   57.2 ml 23.12 ml/m  RA Volume:   78.80 ml  31.84 ml/m LA Vol (A4C):   70.4 ml 28.45 ml/m LA Biplane Vol: 67.1 ml 27.12 ml/m  AORTIC VALVE LVOT Vmax:    67.30 cm/s LVOT Vmean:  46.900 cm/s LVOT VTI:    0.106 m  AORTA Ao Root diam: 3.30 cm MITRAL VALVE                TRICUSPID VALVE MV Area (PHT): 3.58 cm     TR Peak grad:   32.7 mmHg MV Area VTI:   1.04 cm     TR Vmax:        286.00 cm/s MV Peak grad:  7.4 mmHg MV Mean grad:  2.0 mmHg     SHUNTS MV Vmax:       1.36 m/s     Systemic VTI:  0.11 m MV Vmean:      59.4 cm/s    Systemic Diam: 2.00 cm MV Decel Time: 212 msec MR Peak grad: 83.2  mmHg MR Mean grad: 48.0 mmHg MR Vmax:      456.00 cm/s MR Vmean:     314.0 cm/s MV E velocity: 127.00 cm/s Jayson Sierras MD Electronically signed by Jayson Sierras MD Signature Date/Time: 04/17/2024/4:08:47 PM    Final    DG Chest Portable 1 View Result Date: 04/16/2024 CLINICAL DATA:  Weakness. EXAM: PORTABLE CHEST 1 VIEW COMPARISON:  April 14, 2024 FINDINGS: The cardiac silhouette is mildly enlarged and unchanged in size. Low lung volumes are noted. Mild linear atelectasis is seen within the bilateral lung bases. Small bilateral pleural effusions are suspected. No pneumothorax is identified. The visualized skeletal structures are unremarkable. IMPRESSION: 1. Stable cardiomegaly and low lung volumes with mild bibasilar linear atelectasis. 2. Small bilateral pleural effusions. Electronically Signed   By: Suzen Dials M.D.   On: 04/16/2024 19:19   CT Angio Chest PE W and/or Wo Contrast Result Date: 04/14/2024 CLINICAL DATA:  Pulmonary embolism (PE) suspected, high prob EXAM: CT ANGIOGRAPHY CHEST WITH CONTRAST TECHNIQUE: Multidetector CT imaging of the chest was performed using the standard protocol during bolus administration of intravenous contrast. Multiplanar CT image reconstructions and MIPs were obtained to evaluate the vascular anatomy. RADIATION DOSE REDUCTION: This exam was performed according to the departmental dose-optimization program which includes automated exposure control, adjustment of the mA and/or kV according to patient size and/or use of iterative  reconstruction technique. CONTRAST:  75mL OMNIPAQUE  IOHEXOL  350 MG/ML SOLN COMPARISON:  CT abdomen 06/30/2014 FINDINGS: Cardiovascular: Heart size upper limits normal. Small pericardial effusion. Satisfactory opacification of pulmonary arteries noted, and there is no evidence of pulmonary emboli. Extensive coronary calcifications. Incomplete opacification of the thoracic aorta limiting evaluation for dissection. No aneurysm. Mild scattered calcified atheromatous plaque in the arch and descending thoracic aorta. Mediastinum/Nodes: No mediastinal hematoma, mass, or adenopathy. Subcentimeter right paratracheal and precarinal lymph nodes are noted. Lungs/Pleura: Moderate right and smaller left pleural effusions. No pneumothorax. Patchy atelectasis/consolidation in the lung bases right greater than left. 7 mm nodule, medial basal segment left lower lobe (6:99) . Upper Abdomen: No acute findings. Musculoskeletal: No chest wall abnormality. No acute or significant osseous findings. Review of the MIP images confirms the above findings. IMPRESSION: 1. Negative for acute PE or thoracic aortic dissection. 2. Moderate right and smaller left pleural effusions with bibasilar atelectasis/consolidation. 3. 7 mm left lower lobe nodule. Non-contrast chest CT at 6-12 months is recommended. If the nodule is stable at time of repeat CT, then future CT at 18-24 months (from today's scan) is considered optional for low-risk patients, but is recommended for high-risk patients. This recommendation follows the consensus statement: Guidelines for Management of Incidental Pulmonary Nodules Detected on CT Images: From the Fleischner Society 2017; Radiology 2017; 284:228-243. 4.  Aortic Atherosclerosis (ICD10-I70.0). Electronically Signed   By: JONETTA Faes M.D.   On: 04/14/2024 10:39    ASSESSMENT & PLAN  1.  Acute HFrEF, LVEF 30 to 35% with wall motion abnormalities consistent with ischemic cardiomyopathy and known multivessel CAD managed  medically as of 2014 (poor candidate for CABG at that time in light of other active comorbidities including infection).  He presents with worsening dyspnea on exertion and fluid retention, no angina or evidence of ACS.  Clinically improving with IV diuresis and further room to optimize GDMT.  2.  PAD status post remote osteomyelitis, left toe amputations and right BKA.  Uses a right leg prosthesis.  3.  Mixed hyperlipidemia.  LDL 48 in March.  4.  Primary hypertension.  5.  Carotid artery disease.  Scheduled for outpatient follow-up carotid Dopplers.  6.  Incidentally noted 7 mm left lower lobe nodule by recent chest CT.  7.  CKD stage IIIb, creatinine down to 1.28 with normal GFR at this point.  Discussed situation with patient.  Recommend optimizing GDMT at this time with additional diuresis.  Further workup with cardiac catheterization can ultimately be considered, although revascularization options may be limited at this point as his multivessel disease has almost certainly advanced over the last decade.  Continue aspirin  81 mg daily, Lipitor  40 mg daily, Lasix  40 mg IV every 12 hours.  Plan to stop Cozaar  and replace it with Entresto beginning at 24/26 mg twice daily.  Resume Coreg  at 6.25 mg twice daily.  Start Farxiga 10 mg daily.  Once fluid status further optimized within the next 24 to 48 hours can likely add Aldactone 12.5 mg daily presuming renal function is relatively stable.  I do not think that he needs an inpatient right and left heart catheterization at this point given clinical improvement with standard measures.  Once GDMT optimized he can be seen as an outpatient by Dr. Alvan with plans for further workup as necessary.  For questions or updates, please contact [facility] Please consult [email or web address] for contact info under   Signed, Jayson Sierras, MD  04/18/2024 10:06 AM

## 2024-04-18 NOTE — Plan of Care (Signed)
  Problem: Health Behavior/Discharge Planning: Goal: Ability to manage health-related needs will improve Outcome: Progressing   Problem: Clinical Measurements: Goal: Ability to maintain clinical measurements within normal limits will improve Outcome: Progressing Goal: Will remain free from infection Outcome: Progressing   Problem: Activity: Goal: Risk for activity intolerance will decrease Outcome: Progressing   Problem: Pain Managment: Goal: General experience of comfort will improve and/or be controlled Outcome: Progressing   Problem: Safety: Goal: Ability to remain free from injury will improve Outcome: Progressing

## 2024-04-18 NOTE — Progress Notes (Signed)
 Heart Failure Nurse Navigator Progress Note  PCP: [name], MD PCP-Cardiologist: Dorn Ross, MD Admission Diagnosis: Acute on chronic congestive heart failure, unspecified heart failure type [facility]) Admitted from: Home in POV  *[facility] patient.  Two patient identifiers confirmed before phone conversation, Heart Failure education and TOC appointment scheduled.  Presentation:   Albert [name] presented with lower leg extremity edema, fatigue and generalized weakness.  Seen here 2 days prior for the same thing.  He was taking 80 mg of Lasix  daily and denies any missed dose.  Continued to have swelling of his left lower extremity.  He has a right BKA as well as a mid left foot amputation.  He also had some exertional shortness of breath, no chest pain, denies cough, fever or chills. BNP 862. CXR- Stable cardiomegaly and low lung volumes with mild bibasilar linear atelectasis. Small bilateral pleural effusions.   ECHO/ LVEF: 30-35%  Clinical Course:  Past Medical History:  Diagnosis Date   Allergic rhinitis    Anemia    Chronic cough    Chronic sinusitis    Complication of anesthesia    couldn't swallow and talk   Coronary artery disease    CVA (cerebral infarction) 2011   R hand deficit   Diabetes mellitus without complication (HCC) ~2000   last HbA1c ~9 .... fasting 160s   GERD (gastroesophageal reflux disease)    Heart murmur    Hypercholesteremia    Hypertension    Myocardial infarction (HCC) 2014   Osteomyelitis (HCC)    first and third metatarsal   Pneumonia    Splenic infarct    setting of lupus anticoagulant     Social History   Socioeconomic History   Marital status: Divorced    Spouse name: Not on file   Number of children: 2   Years of education: Not on file   Highest education level: Some college, no degree  Occupational History   Occupation: Museum/gallery curator: [employer]    Comment: Disabled  Tobacco Use    Smoking status: Never   Smokeless tobacco: Never  Vaping Use   Vaping status: Never Used  Substance and Sexual Activity   Alcohol use: No    Alcohol/week: 0.0 standard drinks of alcohol   Drug use: No   Sexual activity: Not Currently  Other Topics Concern   Not on file  Social History Narrative   Not on file   Social Drivers of Health   Financial Resource Strain: Medium Risk (04/18/2024)   Overall Financial Resource Strain (CARDIA)    Difficulty of Paying Living Expenses: Somewhat hard  Food Insecurity: No Food Insecurity (04/18/2024)   Hunger Vital Sign    Worried About Running Out of Food in the Last Year: Never true    Ran Out of Food in the Last Year: Never true  Transportation Needs: No Transportation Needs (04/18/2024)   PRAPARE - Administrator, Civil Service (Medical): No    Lack of Transportation (Non-Medical): No  Physical Activity: Not on file  Stress: Not on file  Social Connections: Not on file   Education Assessment and Provision:  Detailed education and instructions provided on heart failure disease management including the following:  Signs and symptoms of Heart Failure When to call the physician Importance of daily weights Low sodium diet Fluid restriction Medication management Anticipated future follow-up appointments  Patient education given on each of the above topics.  Patient acknowledges understanding via  teach back method and acceptance of all instructions.  Education Materials:  Living Better With Heart Failure Booklet, HF zone tool, & Daily Weight Tracker Tool.  Patient has scale at home: Not that works.  SW @ [facility] to deliver him a scale. Patient has pill box at home: Micaela is compliant with medications that he gets [facility] [place].    High Risk Criteria for Readmission and/or Poor Patient Outcomes: Heart failure hospital admissions (last 6 months): 2  No Show rate: 1% Difficult social situation: No Demonstrates  medication adherence: Yes Primary Language: English Literacy level: Reading, Writing & Comprehension  Barriers of Care:   Sodium intake. Daily weights  Considerations/Referrals:   Referral made to Heart Failure Pharmacist Stewardship: Yes Referral made to Heart Failure CSW/NCM TOC: SW to deliver him a scale and also talk to him about insurance transportation options if available to him through [place]. Referral made to Heart & Vascular TOC clinic: Yes. 04/25/2024 @ 10:30 @ [facility]  Items for Follow-up on DC/TOC: Diet & Fluid Restrictions Daily Weights Continued Heart Failure Education  Charmaine Pines, RN, BSN [facility] Heart Failure Navigator Secure Chat Only

## 2024-04-18 NOTE — Care Management Important Message (Signed)
 Important Message  Patient Details  Name: Albert [name] MRN: [id number] Date of Birth: 1960/12/19   Important Message Given:  Yes - Medicare IM     [name] L Amirra Herling 04/18/2024, 12:16 PM

## 2024-04-18 NOTE — Progress Notes (Addendum)
 Heart Failure Stewardship Pharmacy Note  PCP: [name], MD PCP-Cardiologist: Alvan Carrier, MD  HPI: Albert [name] is a 63 y.o. male with T2DM, hypertension, hyperlipidemia, CAD, CHF PVD, right BKA, left transmetatarsal amputation, prior stroke with residual right arm weakness who presented with 10 day history of progressive lower extremity edema, weakness, and fatigue. On admission, BNP was 859, HS-troponin was 33, and AST/ALT of 22/38. Chest x-ray noted pulmonary vascular congestion. CTA was negative for PE and showed moderate right and smaller left pleural effusions.  Pertinent cardiac history: Known PVD since 11/2012 with multiple interventions. TTE in 11/2012 with LVEF of 50-55% with grade I diastolic dysfunction. TEE 06/2013 showed LVEF of 45-50%, moderate MR. TTE in 12/2013 with LVEF 40-45%. TTE 01/2017 noted LVEF of 50% with grade I diastolic dysfunction. Echocardiogram 04/17/2024 showed LVEF reduced to 30-35%, mild concentric LVH, small to moderate pericardial effusion, mild to moderate MR.  Pertinent Lab Values: Creatinine, Ser  Date Value Ref Range Status  04/17/2024 1.28 (H) 0.61 - 1.24 mg/dL Final   BUN  Date Value Ref Range Status  04/17/2024 35 (H) 8 - 23 mg/dL Final  95/77/7984 44 (H) 6 - 24 mg/dL Final   Potassium  Date Value Ref Range Status  04/17/2024 3.8 3.5 - 5.1 mmol/L Final   Sodium  Date Value Ref Range Status  04/17/2024 140 135 - 145 mmol/L Final  02/05/2014 137 134 - 144 mmol/L Final   B Natriuretic Peptide  Date Value Ref Range Status  04/16/2024 862.0 (H) 0.0 - 100.0 pg/mL Final    Comment:    Performed at [facility], [address] [place], [place] [address]   Magnesium   Date Value Ref Range Status  04/17/2024 2.2 1.7 - 2.4 mg/dL Final    Comment:    Performed at [facility], [address] [place], [place] [address]   Hgb A1c MFr Bld  Date Value Ref Range Status  12/29/2018 7.5 (H) 4.8 - 5.6 % Final    Comment:    (NOTE) Pre  diabetes:          5.7%-6.4% Diabetes:              >6.4% Glycemic control for   <7.0% adults with diabetes    TSH  Date Value Ref Range Status  01/03/2014 2.40 0.35 - 5.50 uIU/mL Final    Vital Signs: Temp:  [97.7 F (36.5 C)-98.4 F (36.9 C)] 97.7 F (36.5 C) (07/03 0408) Pulse Rate:  [91-98] 91 (07/03 0408) Cardiac Rhythm: Normal sinus rhythm;Bundle branch block (07/03 0900) Resp:  [18-20] 18 (07/03 0408) BP: (122-133)/(84-94) 127/89 (07/03 0408) SpO2:  [97 %-98 %] 98 % (07/03 0408) Weight:  [122.4 kg (269 lb 13.5 oz)] 122.4 kg (269 lb 13.5 oz) (07/03 0408)  Intake/Output Summary (Last 24 hours) at 04/18/2024 1103 Last data filed at 04/18/2024 0930 Gross per 24 hour  Intake 1200 ml  Output 2700 ml  Net -1500 ml    Current Heart Failure Medications:  Loop diuretic: furosemide  40 mg IV q12h Beta-Blocker: none ACEI/ARB/ARNI: Entresto 24-26 mg BID MRA: none SGLT2i: Farxiga 10 mg daily Other: none  Prior to admission Heart Failure Medications:  Loop diuretic: furosemide  80 mg daily Beta-Blocker: carvedilol  25 mg BID ACEI/ARB/ARNI: none MRA: none SGLT2i: none Other: hydralazine  10 mg BID  Assessment: 1. Acute on chronic combined systolic and diastolic heart failure (LVEF 30-35%)  , due to unknown etiology. NYHA class III symptoms.  -Symptoms: Patient reports improvements to shortness of  breath and leg swelling. Appetite is good. Denies excessive fatigue.  -Volume: Decent urine output yesterday on furosemide  40 mg IV BID. Creatinine had improved yesterday, though none taken today. Can continue furosemide  40 mg IV BID or increase to 80 mg IV BID per DOSE Trial. -Hemodynamics: BP remains mildly elevated. HR 90s. -BB: Home carvedilol  is being held while diuresing. Can consider resuming when euvolemic, though may require starting a lower dose. -ACEI/ARB/ARNI: Losartan  has been transitioned to Entresto 24-26 mg BID.  -MRA: Consider adding spironolactone 12.5 or 25 mg daily  tomorrow if creatinine/K are stable and BP remains >110 mmHg systolic. -SGLT2i: Agree with adding Farxiga 10 mg daily today.  Plan: 1) Medication changes recommended at this time: -Agree with the addition of Entresto and Farxiga today. -Tomorrow, can consider adding spironolactone if creatinine and K are stable with SBP ~110 mmHg or more.  2) Patient assistance: -Entresto, Farxiga, and Jardiance are all $12.15  3) Education: - Patient has been educated on current HF medications and potential additions to HF medication regimen - Patient verbalizes understanding that over the next few months, these medication doses may change and more medications may be added to optimize HF regimen - Patient has been educated on basic disease state pathophysiology and goals of therapy  Medication Assistance / Insurance Benefits Check: Does the patient have prescription insurance?    Type of insurance plan:  Does the patient qualify for medication assistance through manufacturers or grants? No  Eligible grants and/or patient assistance programs: Patient has medicare extra help copays   Outpatient Pharmacy: [facility] mail order services. Can help set this up at clinic follow-up if patient is interested.  Please do not hesitate to reach out with questions or concerns,  Jaun Bash, PharmD, CPP, BCPS Heart Failure Pharmacist  Phone - [phone] 04/18/2024 11:03 AM

## 2024-04-18 NOTE — Plan of Care (Signed)
   Problem: Health Behavior/Discharge Planning: Goal: Ability to manage health-related needs will improve Outcome: Progressing

## 2024-04-18 NOTE — Telephone Encounter (Signed)
 Pharmacy Patient Advocate Encounter  Insurance verification completed.    The patient is insured through [employer]. Patient has Medicare and is not eligible for a copay card, but may be able to apply for patient assistance or Medicare RX Payment Plan (Patient Must reach out to their plan, if eligible for payment plan), if available.    Ran test claim for Entresto and the current 30 day co-pay is $12.15.  Ran test claim for Doreen and the current 30 day co-pay is $12.15.  Ran test claim for Jardiance and the current 30 day co-pay is $12.15.  This test claim was processed through [facility]- copay amounts may vary at other pharmacies due to pharmacy/plan contracts, or as the patient moves through the different stages of their insurance plan.

## 2024-04-19 DIAGNOSIS — J90 Pleural effusion, not elsewhere classified: Secondary | ICD-10-CM | POA: Diagnosis not present

## 2024-04-19 DIAGNOSIS — I10 Essential (primary) hypertension: Secondary | ICD-10-CM | POA: Diagnosis not present

## 2024-04-19 DIAGNOSIS — E782 Mixed hyperlipidemia: Secondary | ICD-10-CM | POA: Diagnosis not present

## 2024-04-19 DIAGNOSIS — I5023 Acute on chronic systolic (congestive) heart failure: Secondary | ICD-10-CM | POA: Diagnosis not present

## 2024-04-19 LAB — GLUCOSE, CAPILLARY
Glucose-Capillary: 160 mg/dL — ABNORMAL HIGH (ref 70–99)
Glucose-Capillary: 162 mg/dL — ABNORMAL HIGH (ref 70–99)
Glucose-Capillary: 171 mg/dL — ABNORMAL HIGH (ref 70–99)
Glucose-Capillary: 174 mg/dL — ABNORMAL HIGH (ref 70–99)
Glucose-Capillary: 198 mg/dL — ABNORMAL HIGH (ref 70–99)
Glucose-Capillary: 245 mg/dL — ABNORMAL HIGH (ref 70–99)

## 2024-04-19 MED ORDER — CARVEDILOL 3.125 MG PO TABS
6.2500 mg | ORAL_TABLET | Freq: Two times a day (BID) | ORAL | Status: DC
  Administered 2024-04-19 – 2024-04-20 (×2): 6.25 mg via ORAL
  Filled 2024-04-19 (×2): qty 2

## 2024-04-19 MED ORDER — INSULIN ASPART 100 UNIT/ML IJ SOLN
0.0000 [IU] | Freq: Three times a day (TID) | INTRAMUSCULAR | Status: DC
  Administered 2024-04-20: 2 [IU] via SUBCUTANEOUS
  Administered 2024-04-20: 3 [IU] via SUBCUTANEOUS

## 2024-04-19 MED ORDER — INSULIN GLARGINE-YFGN 100 UNIT/ML ~~LOC~~ SOLN
5.0000 [IU] | Freq: Every day | SUBCUTANEOUS | Status: DC
  Administered 2024-04-19: 5 [IU] via SUBCUTANEOUS
  Filled 2024-04-19 (×2): qty 0.05

## 2024-04-19 MED ORDER — INSULIN ASPART 100 UNIT/ML IJ SOLN
0.0000 [IU] | Freq: Every day | INTRAMUSCULAR | Status: DC
  Administered 2024-04-19: 2 [IU] via SUBCUTANEOUS

## 2024-04-19 NOTE — Progress Notes (Signed)
 Progress Note   Patient: Albert [name] FMW:[id number] DOB: 09/16/1961 DOA: 04/16/2024     3 DOS: the patient was seen and examined on 04/19/2024   Brief hospital admission narrative: As per H&P written by Dr. Manfred on 04/16/2024 Albert [name] is a 63 y.o. male with medical history significant of T2DM, hypertension, hyperlipidemia, CAD, CHF PVD, right BKA, left transmetatarsal amputation, prior stroke with some right arm weakness who presents to the emergency department due to 10 days of worsening left lower extremity edema, generalized weakness and fatigue.  He presented to the emergency department on Sunday 6/29 for similar symptoms where he was treated with IV Lasix  80 milligram x 1 and patient was discharged home.  Patient states that he has been compliant with his Lasix  (80 mg daily), but instead, he continues to have increased swelling of LLE and worsening shortness of breath on exertion. Patient endorsed oncoming follow-up with his cardiologist for an echo and carotid study later in the month.   ED Course:  In the emergency department, he was hemodynamically stable, though BP was soft at 98/74.  Workup in the ED showed normocytic anemia, BMP was normal except for blood glucose of 69, urine/creatinine/1.32 (creatinine is within baseline range), albumin  3.4, BNP 862, troponin 37 > 38. Chest x-ray showed stable cardiomegaly and low lung volumes with mild bibasilar linear atelectasis. Small bilateral pleural effusions. He was treated with IV Lasix  40 mg x 1.  TRH was asked to admit patient   Assessment and plan 1-acute on chronic CHF - Prior echo in 2018 demonstrating ejection fraction 50% with grade 1 diastolic dysfunction - Repeat echo with ejection fraction of 35% - Patient qualified for acute systolic heart failure - Continue IV diuresis - Continue to follow low-sodium diet, strict I's and O's and daily weights - Appreciate assistance and recommendation by cardiology service; follow  recommendations patient has been started on Entresto  and Farxiga . - Plan is for further tweaks/medical management through [facility] and outpatient decision regarding cardiac catheterization down the road.  2-bilateral pleural effusion - Appreciated on chest x-ray at time of admission; most likely associated with vascular congestion/CHF - Continue diuresis - No requiring oxygen  supplementation-patient already improving; will continue to hold on thoracentesis.  3-elevated troponin - Flat with a level of 37 >>38; in the setting of demand ischemia from CHF most likely - Patient denies any chest pain - Continue diuresis and will follow any further cardiology service recommendations.  4-morbid obesity -Body mass index is 36.59 kg/m.  -Low-calorie diet, portion control and increased activity discussed with patient.  5-hypertension - Vital signs stable and well-controlled - Continue IV diuresis - Following cardiology recommendations patient started on Entresto . - Will follow blood pressure trend.  6-hyperlipidemia - Continue Lipitor  - Heart healthy diet discussed with patient.  7-prior history of coronary artery disease/peripheral vascular disease - Status post right BKA - Continue aspirin  and statin - Patient reports no chest pain; shortness of breath most likely secondary to CHF exacerbation but questionable if there is or not any component of angina equivalent. - Cardiology has been consulted and will follow any further recommendation. - For now plan will be for medical management and outpatient follow-up to determine cardiac cath timing.  Subjective:  No chest pain, no nausea, no vomiting and no abdominal pain; patient reports improvement in his breathing and good urine output.  Physical Exam: Vitals:   04/18/24 1234 04/18/24 2003 04/19/24 0350 04/19/24 1248  BP: 130/77 (!) 130/90 131/89 127/89  Pulse:  99 95 91 89  Resp:  18 14 16   Temp: (!) 97.4 F (36.3 C) 98.2 F (36.8 C) 98.6  F (37 C) 97.8 F (36.6 C)  TempSrc: Oral Oral Oral Oral  SpO2: 98% 95% 100% 100%  Weight:   119 kg   Height:       General exam: Alert, awake, oriented x 3; reporting no chest pain, improvement in his respiratory status and increased urine output. Respiratory system: No requiring oxygen  supplementation; no using accessory muscle.  Good saturation on room air. Cardiovascular system:RRR.  No rubs, no gallops; unable to assess JVD with body habitus. Gastrointestinal system: Abdomen is obese, nondistended, soft and nontender.  Positive bowel sounds. Central nervous system: No focal neurological deficits. Extremities: No cyanosis or clubbing; right BKA appreciated on exam.  Left lower extremity with trace edema and well-healed transmetatarsal foot amputation. Skin: No petechiae. Psychiatry: Judgement and insight appear normal. Mood & affect appropriate.   Latest data Reviewed: Comprehensive metabolic panel: Sodium 140, potassium 3.8, chloride 104, bicarb 24, BUN 35, creatinine 1.28 and GFR >60.  Normal LFTs appreciated. CBC: WBCs 9.7, hemoglobin 10.1 and platelet counts 305K Magnesium : 2.2 Phosphorus: 4.4  Family Communication: No family at bedside.  Disposition: Status is: Inpatient Remains inpatient appropriate because: Continue IV therapy.  Anticipate discharge back home once medically stable.  Time spent: 50 minutes  Author: Eric Nunnery, MD 04/19/2024 3:43 PM  For on call review [email or web address].  Progress Note   Patient: Albert [name] FMW:[id number] DOB: 03-Feb-1961 DOA: 04/16/2024     3 DOS: the patient was seen and examined on 04/19/2024   Brief hospital admission narrative: As per H&P written by Dr. Manfred on 04/16/2024 Albert [name] is a 63 y.o. male with medical history significant of T2DM, hypertension, hyperlipidemia, CAD, CHF PVD, right BKA, left transmetatarsal amputation, prior stroke with some right arm weakness who presents to the emergency department due to 10 days of worsening left lower extremity edema, generalized weakness and fatigue.  He presented to the emergency department on Sunday 6/29 for similar symptoms where he was treated with IV Lasix  80 milligram x 1 and patient was discharged home.  Patient states that he has been compliant with his Lasix  (80 mg daily), but instead, he continues to have increased swelling of LLE and worsening shortness of breath on exertion. Patient endorsed oncoming follow-up with his cardiologist for an echo and carotid study later in the month.   ED Course:  In the emergency department, he was hemodynamically stable, though BP was soft at 98/74.  Workup in the ED showed normocytic anemia, BMP was  normal except for blood glucose of 69, urine/creatinine/1.32 (creatinine is within baseline range), albumin  3.4, BNP 862, troponin 37 > 38. Chest x-ray showed stable cardiomegaly and low lung volumes with mild bibasilar linear atelectasis. Small bilateral pleural effusions. He was treated with IV Lasix  40 mg x 1.  TRH was asked to admit patient   Assessment and plan 1-acute on chronic CHF - Prior echo in 2018 demonstrating ejection fraction 50% with grade 1 diastolic dysfunction - Repeat echo with ejection fraction of 35% - Patient qualified for acute systolic heart failure - Continue IV diuresis for another 24 hours. - Continue to follow low-sodium diet, strict I's and O's and daily weights - Appreciate assistance and recommendation by cardiology service; follow recommendations patient has been started on Entresto  and Farxiga . -Will also resume adjusted dose of carvedilol . - Plan is for further tweaks/medical management through [facility] and outpatient decision regarding cardiac catheterization down the road.  2-bilateral pleural effusion - Appreciated on chest x-ray at time of admission; most likely associated with vascular congestion/CHF - Continue diuresis - No requiring oxygen  supplementation-patient already improving; will continue to hold on thoracentesis.  3-elevated troponin - Flat with a level of 37 >>38; in the setting of demand ischemia from CHF most likely - Patient denies any chest pain - Continue IV diuresis and will follow any further cardiology service recommendations.  4-morbid obesity -Body mass index is 36.59 kg/m.  -Low-calorie diet, portion control and increased activity discussed with patient.  5-hypertension - Vital signs stable and well-controlled - Continue IV diuresis - Following cardiology recommendations patient started on Entresto . - Will follow blood pressure trend.  6-hyperlipidemia - Continue Lipitor  - Heart healthy diet discussed with  patient.  7-prior history of coronary artery disease/peripheral vascular disease - Status post right BKA - Continue aspirin  and statin - Patient reports no chest pain; shortness of breath most likely secondary to CHF exacerbation but questionable if there is or not any component of angina equivalent. - Cardiology has been consulted and will follow any further recommendation. - For now plan will be for medical management and outpatient follow-up to determine cardiac cath timing.  Subjective:  No chest pain, no nausea, no vomiting.  Reports good urine output and a stable breathing.  Physical Exam: Vitals:   04/18/24 1234 04/18/24 2003 04/19/24 0350 04/19/24 1248  BP: 130/77 (!) 130/90 131/89 127/89  Pulse: 99 95 91 89  Resp:  18 14 16   Temp: (!) 97.4 F (36.3 C) 98.2 F (36.8 C) 98.6 F (37 C) 97.8 F (36.6 C)  TempSrc: Oral Oral Oral Oral  SpO2: 98% 95% 100% 100%  Weight:   119 kg   Height:       General exam: Alert, awake, oriented x 3; reporting good urine output and expressing significant improvement in his breathing status. Respiratory system: No frank crackles; no using accessory muscle.  Good saturation on room air.  Decreased breath sounds appreciated on exam. Cardiovascular system:RRR.  No rubs, no gallops, unable to assess JVD with body habitus. Gastrointestinal system: Abdomen is obese, nondistended, soft and nontender. No organomegaly or masses felt. Normal bowel sounds heard. Central nervous system:  No focal neurological deficits. Extremities: No cyanosis or clubbing; right BKA.  Trace edema up to his thighs appreciated on left lower extremity.  Positive transmetatarsal foot amputation appreciated. Skin: No petechiae. Psychiatry: Judgement and insight appear normal. Mood & affect appropriate.   Latest data Reviewed: Comprehensive metabolic panel: Sodium 140, potassium 3.8, chloride 104, bicarb 24, BUN 35, creatinine 1.28 and GFR >60.  Normal LFTs appreciated. CBC:  WBCs 9.7, hemoglobin 10.1 and platelet counts 305K Magnesium : 2.2 Phosphorus: 4.4  Family Communication: No family at bedside.  Disposition: Status is: Inpatient Remains inpatient appropriate because: Continue IV therapy.  Anticipate discharge back home once medically stable.  Time spent: 50 minutes  Author: Eric Nunnery, MD 04/19/2024 3:43 PM  For on call review [email or web address].

## 2024-04-20 DIAGNOSIS — E11649 Type 2 diabetes mellitus with hypoglycemia without coma: Secondary | ICD-10-CM | POA: Diagnosis not present

## 2024-04-20 DIAGNOSIS — Z794 Long term (current) use of insulin: Secondary | ICD-10-CM

## 2024-04-20 DIAGNOSIS — I10 Essential (primary) hypertension: Secondary | ICD-10-CM | POA: Diagnosis not present

## 2024-04-20 DIAGNOSIS — E782 Mixed hyperlipidemia: Secondary | ICD-10-CM | POA: Diagnosis not present

## 2024-04-20 DIAGNOSIS — I5033 Acute on chronic diastolic (congestive) heart failure: Secondary | ICD-10-CM | POA: Diagnosis not present

## 2024-04-20 LAB — BASIC METABOLIC PANEL WITH GFR
Anion gap: 6 (ref 5–15)
BUN: 25 mg/dL — ABNORMAL HIGH (ref 8–23)
CO2: 27 mmol/L (ref 22–32)
Calcium: 8.3 mg/dL — ABNORMAL LOW (ref 8.9–10.3)
Chloride: 103 mmol/L (ref 98–111)
Creatinine, Ser: 1.19 mg/dL (ref 0.61–1.24)
GFR, Estimated: >60 mL/min (ref >60)
Glucose, Bld: 135 mg/dL — ABNORMAL HIGH (ref 70–99)
Potassium: 3.4 mmol/L — ABNORMAL LOW (ref 3.5–5.1)
Sodium: 136 mmol/L (ref 135–145)

## 2024-04-20 LAB — GLUCOSE, CAPILLARY
Glucose-Capillary: 142 mg/dL — ABNORMAL HIGH (ref 70–99)
Glucose-Capillary: 157 mg/dL — ABNORMAL HIGH (ref 70–99)
Glucose-Capillary: 189 mg/dL — ABNORMAL HIGH (ref 70–99)

## 2024-04-20 LAB — HEMOGLOBIN A1C
Hgb A1c MFr Bld: 7.3 % — ABNORMAL HIGH (ref 4.8–5.6)
Mean Plasma Glucose: 162.81 mg/dL

## 2024-04-20 MED ORDER — METFORMIN HCL ER 500 MG PO TB24: 500.0000 mg | ORAL_TABLET | Freq: Every day | ORAL | Status: AC

## 2024-04-20 MED ORDER — TORSEMIDE 40 MG PO TABS: 40.0000 mg | ORAL_TABLET | Freq: Two times a day (BID) | ORAL | 2 refills | Status: DC

## 2024-04-20 MED ORDER — SACUBITRIL-VALSARTAN 24-26 MG PO TABS
1.0000 | ORAL_TABLET | Freq: Two times a day (BID) | ORAL | 2 refills | Status: DC
Start: 2024-04-20 — End: 2024-07-16

## 2024-04-20 MED ORDER — CARVEDILOL 6.25 MG PO TABS: 6.2500 mg | ORAL_TABLET | Freq: Two times a day (BID) | ORAL | 2 refills | Status: DC

## 2024-04-20 MED ORDER — DAPAGLIFLOZIN PROPANEDIOL 10 MG PO TABS: 10.0000 mg | ORAL_TABLET | Freq: Every day | ORAL | 3 refills | Status: DC

## 2024-04-20 MED ORDER — POTASSIUM CHLORIDE CRYS ER 20 MEQ PO TBCR
40.0000 meq | EXTENDED_RELEASE_TABLET | Freq: Once | ORAL | Status: AC
  Administered 2024-04-20: 40 meq via ORAL
  Filled 2024-04-20: qty 2

## 2024-04-20 NOTE — Discharge Summary (Signed)
 Physician Discharge Summary   Patient: Albert [name] MRN: [id number] DOB: 10-25-1960  Admit date:     04/16/2024  Discharge date: 04/20/24  Discharge Physician: Eric Nunnery   PCP: [name], MD   Recommendations at discharge:  Reassess blood pressure and adjust antihypertensive treatment as needed Repeat basic metabolic panel to follow electrolytes and renal function Reassess patient's CBGs fluctuation and further adjust hypoglycemic regimen as needed Make sure patient follow-up with cardiology service as instructed. Continue assisting patient with weight loss management.  Discharge Diagnoses: Principal Problem:   Acute on chronic diastolic CHF (congestive heart failure) (HCC) Active Problems:   Essential hypertension, benign   Peripheral vascular disease, unspecified (HCC)   Coronary atherosclerosis of native coronary artery   Elevated brain natriuretic peptide (BNP) level   Bilateral pleural effusion   Type 2 diabetes mellitus with hypoglycemia (HCC)   Hypoalbuminemia due to protein-calorie malnutrition (HCC)   Obesity, Class III, BMI 40-49.9 (morbid obesity)   Mixed hyperlipidemia   Elevated troponin  Brief hospital admission narrative: As per H&P written by Dr. Manfred on 04/16/2024 Albert [name] is a 63 y.o. male with medical history significant of T2DM, hypertension, hyperlipidemia, CAD, CHF PVD, right BKA, left transmetatarsal amputation, prior stroke with some right arm weakness who presents to the emergency department due to 10 days of worsening left lower extremity edema, generalized weakness and fatigue.  He presented to the emergency department on Sunday 6/29 for similar symptoms where he was treated with IV Lasix  80 milligram x 1 and patient was discharged home.  Patient states that he has been compliant with his Lasix  (80 mg daily), but instead, he continues to have increased swelling of LLE and worsening shortness of breath on exertion. Patient endorsed oncoming  follow-up with his cardiologist for an echo and carotid study later in the month.   ED Course:  In the emergency department, he was hemodynamically stable, though BP was soft at 98/74.  Workup in the ED showed normocytic anemia, BMP was normal except for blood glucose of 69, urine/creatinine/1.32 (creatinine is within baseline range), albumin  3.4, BNP 862, troponin 37 > 38. Chest x-ray showed stable cardiomegaly and low lung volumes with mild bibasilar linear atelectasis. Small bilateral pleural effusions. He was treated with IV Lasix  40 mg x 1.  TRH was asked to admit patient  Assessment and Plan: 1-acute on chronic CHF - Prior echo in 2018 demonstrating ejection fraction 50% with grade 1 diastolic dysfunction - Repeat echo with ejection fraction of 35% - Patient qualified for acute systolic heart failure - Continue to follow low-sodium diet, strict I's and O's and daily weights - Appreciate assistance and recommendation by cardiology service; following recommendations patient has been started on Entresto  and Farxiga .  Will continue carvedilol  and adjusted dose of diuretics (Demadex  at discharge). - Plan is for further tweaks/medical management through GDMT and outpatient decision regarding cardiac catheterization down the road.   2-bilateral pleural effusion - Appreciated on chest x-ray at time of admission; most likely associated with vascular congestion/CHF - Continue diuresis and repeat chest x-ray in 4 weeks to assure resolution/stability. - No requiring oxygen  supplementation-patient already improving; will continue to hold on thoracentesis.   3-elevated troponin - Flat with a level of 37 >>38; in the setting of demand ischemia from CHF most likely - Patient denies any chest pain - Continue diuresis and will follow any further cardiology service recommendations.   4-morbid obesity -Body mass index is 36.59 kg/m.  -Low-calorie diet, portion  control and increased activity discussed  with patient.   5-hypertension - Vital signs stable and well-controlled - Continue to follow vital signs fluctuation and further adjust antihypertensive treatment as required.   6-hyperlipidemia - Continue Lipitor  - Heart healthy diet discussed with patient.   7-prior history of coronary artery disease/peripheral vascular disease - Status post right BKA - Continue aspirin  and statin - Patient reports no chest pain; shortness of breath most likely secondary to CHF exacerbation but questionable if there is or not any component of angina equivalent. - Cardiology has been consulted and will follow any further recommendation. - For now plan will be for medical management and outpatient follow-up to determine cardiac cath timing.  Consultants: Cardiology service Procedures performed: See below for x-ray reports. Disposition: Home Diet recommendation: Heart healthy/low-sodium and modified Kawatu diet.  DISCHARGE MEDICATION: Allergies as of 04/20/2024       Reactions   Amoxicillin Nausea And Vomiting           Medication List     STOP taking these medications    furosemide  80 MG tablet Commonly known as: LASIX    hydrALAZINE  10 MG tablet Commonly known as: APRESOLINE    losartan  50 MG tablet Commonly known as: COZAAR        TAKE these medications    Accu-Chek Guide Test test strip Generic drug: glucose blood SMARTSIG:Strip(s)   aspirin  EC 81 MG tablet Take 1 tablet (81 mg total) by mouth daily.   atorvastatin  40 MG tablet Commonly known as: LIPITOR  Take 40 mg by mouth daily.   carvedilol  6.25 MG tablet Commonly known as: COREG  Take 1 tablet (6.25 mg total) by mouth 2 (two) times daily with a meal. What changed:  medication strength how much to take when to take this   clotrimazole -betamethasone  cream Commonly known as: Lotrisone  Apply 1 application topically 2 (two) times daily. What changed:  when to take this reasons to take this   dapagliflozin   propanediol 10 MG Tabs tablet Commonly known as: FARXIGA  Take 1 tablet (10 mg total) by mouth daily. Start taking on: April 21, 2024   gabapentin 300 MG capsule Commonly known as: NEURONTIN Take 300 mg by mouth 3 (three) times daily as needed (pain). Take 1 capsule 3 times a day by oral route as needed.   insulin  lispro 100 UNIT/ML KwikPen Commonly known as: HUMALOG Inject 20 Units into the skin 3 (three) times daily before meals. What changed: how much to take   Mens 50+ Multivitamin Tabs Take 1 tablet by mouth daily.   metFORMIN  500 MG 24 hr tablet Commonly known as: GLUCOPHAGE -XR Take 1 tablet (500 mg total) by mouth daily with breakfast. What changed: when to take this   nitroGLYCERIN  0.2 mg/hr patch Commonly known as: NITRODUR - Dosed in mg/24 hr APPLY 1 PATCH ONTO SKIN  DAILY What changed:  how much to take how to take this when to take this reasons to take this additional instructions   potassium chloride  10 MEQ tablet Commonly known as: KLOR-CON  M Take 1 tablet (10 mEq total) by mouth daily.   sacubitril -valsartan  24-26 MG Commonly known as: ENTRESTO  Take 1 tablet by mouth 2 (two) times daily.   Torsemide  40 MG Tabs Take 40 mg by mouth 2 (two) times daily.   Tresiba FlexTouch 100 UNIT/ML FlexTouch Pen Generic drug: insulin  degludec Inject 57 Units into the skin at bedtime.        Follow-up Information     [facility]. Go  on 04/25/2024.   Specialty: Cardiology Why: Hospital Follow-Up Please bring all medications to follow-up appointment [facility] [address] [place] [facility], [address], Second Floor [employer] [place] Parking at the door Contact information: [address] [place]  [address] [phone]        [name], MD. Schedule an appointment as soon as possible for a visit in 10 day(s).   Specialty: Internal  Medicine Contact information: [address] [place] [address] [phone]                Discharge Exam: Filed Weights   04/18/24 0408 04/19/24 0350 04/20/24 0446  Weight: 122.4 kg 119 kg 117.6 kg   General exam: Alert, awake, oriented x 3; reporting no chest pain, patient reports breathing status back to baseline and feeling ready to go home. Respiratory system: Improved air movement bilaterally; 1 no using accessory muscle.  No crackles or wheezing. Cardiovascular system:RRR.  No rubs, no gallops; unable to assess JVD with body habitus. Gastrointestinal system: Abdomen is obese, nondistended, soft and nontender.  Positive bowel sounds. Central nervous system: No focal neurological deficits. Extremities: No cyanosis or clubbing; right BKA appreciated on exam.  Left lower extremity with trace edema and well-healed transmetatarsal foot amputation. Skin: No petechiae. Psychiatry: Judgement and insight appear normal. Mood & affect appropriate.   Condition at discharge: Stable and improved.  The results of significant diagnostics from this hospitalization (including imaging, microbiology, ancillary and laboratory) are listed below for reference.   Imaging Studies: ECHOCARDIOGRAM COMPLETE Result Date: 04/17/2024    ECHOCARDIOGRAM REPORT   Patient Name:   Albert [name] Date of Exam: 04/17/2024 Medical Rec #:  [id number]       Height:       71.0 in Accession #:    [id number]      Weight:       291.4 lb Date of Birth:  Feb 21, 1961       BSA:          2.474 m Patient Age:    63 years        BP:           121/91 mmHg Patient Gender: M               HR:           93 bpm. Exam Location:  [facility] Procedure: 2D Echo, Cardiac Doppler, Color Doppler and Intracardiac            Opacification Agent (Both Spectral and Color Flow Doppler were            utilized during procedure). Indications:    CHF- Acute Diastolic l50.31  History:        Patient has prior history of Echocardiogram  examinations, most                 recent 01/26/2017. CHF and Cardiomyopathy, CAD; Risk                 Factors:Hypertension, Diabetes and Dyslipidemia.  Sonographer:    Aida Pizza RCS Referring Phys: [id number] OLADAPO ADEFESO IMPRESSIONS  1. Left ventricular ejection fraction, by estimation, is 30 to 35%. The left ventricle has moderately decreased function. The left ventricle demonstrates regional wall motion abnormalities (see scoring diagram/findings for description). There is mild concentric left ventricular hypertrophy. Left ventricular diastolic parameters are indeterminate.  2. Right ventricular systolic function is normal. The right  ventricular size is normal. There is moderately elevated pulmonary artery systolic pressure. The estimated right ventricular systolic pressure is 47.7 mmHg.  3. Small to moderate pericardial effusion. The pericardial effusion is posterior to the left ventricle. There is no evidence of cardiac tamponade. Trivial anterior collection as well.  4. The mitral valve is degenerative. Mild to moderate mitral valve regurgitation.  5. The aortic valve is tricuspid. Aortic valve regurgitation is not visualized. No aortic stenosis is present.  6. The inferior vena cava is dilated in size with <50% respiratory variability, suggesting right atrial pressure of 15 mmHg. Comparison(s): Prior images unable to be directly viewed. FINDINGS  Left Ventricle: Left ventricular ejection fraction, by estimation, is 30 to 35%. The left ventricle has moderately decreased function. The left ventricle demonstrates regional wall motion abnormalities. Definity  contrast agent was given IV to delineate the left ventricular endocardial borders. The left ventricular internal cavity size was normal in size. There is mild concentric left ventricular hypertrophy. Left ventricular diastolic parameters are indeterminate.  LV Wall Scoring: The mid and distal lateral wall, apical anterior segment, apical inferior  segment, and apex are akinetic. The anterior wall, antero-lateral wall, entire septum, inferior wall, and basal inferolateral segment are hypokinetic. Right Ventricle: The right ventricular size is normal. No increase in right ventricular wall thickness. Right ventricular systolic function is normal. There is moderately elevated pulmonary artery systolic pressure. The tricuspid regurgitant velocity is 2.86 m/s, and with an assumed right atrial pressure of 15 mmHg, the estimated right ventricular systolic pressure is 47.7 mmHg. Left Atrium: Left atrial size was normal in size. Right Atrium: Right atrial size was normal in size. Pericardium: A moderately sized pericardial effusion is present. The pericardial effusion is posterior to the left ventricle. There is no evidence of cardiac tamponade. Mitral Valve: The mitral valve is degenerative in appearance. Mild to moderate mitral valve regurgitation. MV peak gradient, 7.4 mmHg. The mean mitral valve gradient is 2.0 mmHg. Tricuspid Valve: The tricuspid valve is grossly normal. Tricuspid valve regurgitation is mild. Aortic Valve: The aortic valve is tricuspid. There is mild aortic valve annular calcification. Aortic valve regurgitation is not visualized. No aortic stenosis is present. Pulmonic Valve: The pulmonic valve was grossly normal. Pulmonic valve regurgitation is trivial. Aorta: The aortic root is normal in size and structure. Venous: The inferior vena cava is dilated in size with less than 50% respiratory variability, suggesting right atrial pressure of 15 mmHg. IAS/Shunts: No atrial level shunt detected by color flow Doppler. Additional Comments: 3D was performed not requiring image post processing on an independent workstation and was indeterminate.  LEFT VENTRICLE PLAX 2D LVIDd:         5.40 cm LVIDs:         4.30 cm LV PW:         1.10 cm LV IVS:        1.10 cm LVOT diam:     2.00 cm LV SV:         33 LV SV Index:   13 LVOT Area:     3.14 cm  RIGHT VENTRICLE  RV S prime:     7.68 cm/s TAPSE (M-mode): 2.0 cm LEFT ATRIUM             Index        RIGHT ATRIUM           Index LA diam:        4.30 cm 1.74 cm/m   RA Area:  22.80 cm LA Vol (A2C):   57.2 ml 23.12 ml/m  RA Volume:   78.80 ml  31.84 ml/m LA Vol (A4C):   70.4 ml 28.45 ml/m LA Biplane Vol: 67.1 ml 27.12 ml/m  AORTIC VALVE LVOT Vmax:   67.30 cm/s LVOT Vmean:  46.900 cm/s LVOT VTI:    0.106 m  AORTA Ao Root diam: 3.30 cm MITRAL VALVE                TRICUSPID VALVE MV Area (PHT): 3.58 cm     TR Peak grad:   32.7 mmHg MV Area VTI:   1.04 cm     TR Vmax:        286.00 cm/s MV Peak grad:  7.4 mmHg MV Mean grad:  2.0 mmHg     SHUNTS MV Vmax:       1.36 m/s     Systemic VTI:  0.11 m MV Vmean:      59.4 cm/s    Systemic Diam: 2.00 cm MV Decel Time: 212 msec MR Peak grad: 83.2 mmHg MR Mean grad: 48.0 mmHg MR Vmax:      456.00 cm/s MR Vmean:     314.0 cm/s MV E velocity: 127.00 cm/s Albert Sierras MD Electronically signed by Albert Sierras MD Signature Date/Time: 04/17/2024/4:08:47 PM    Final    DG Chest Portable 1 View Result Date: 04/16/2024 CLINICAL DATA:  Weakness. EXAM: PORTABLE CHEST 1 VIEW COMPARISON:  April 14, 2024 FINDINGS: The cardiac silhouette is mildly enlarged and unchanged in size. Low lung volumes are noted. Mild linear atelectasis is seen within the bilateral lung bases. Small bilateral pleural effusions are suspected. No pneumothorax is identified. The visualized skeletal structures are unremarkable. IMPRESSION: 1. Stable cardiomegaly and low lung volumes with mild bibasilar linear atelectasis. 2. Small bilateral pleural effusions. Electronically Signed   By: Suzen Dials M.D.   On: 04/16/2024 19:19   CT Angio Chest PE W and/or Wo Contrast Result Date: 04/14/2024 CLINICAL DATA:  Pulmonary embolism (PE) suspected, high prob EXAM: CT ANGIOGRAPHY CHEST WITH CONTRAST TECHNIQUE: Multidetector CT imaging of the chest was performed using the standard protocol during bolus administration of  intravenous contrast. Multiplanar CT image reconstructions and MIPs were obtained to evaluate the vascular anatomy. RADIATION DOSE REDUCTION: This exam was performed according to the departmental dose-optimization program which includes automated exposure control, adjustment of the mA and/or kV according to patient size and/or use of iterative reconstruction technique. CONTRAST:  75mL OMNIPAQUE  IOHEXOL  350 MG/ML SOLN COMPARISON:  CT abdomen 06/30/2014 FINDINGS: Cardiovascular: Heart size upper limits normal. Small pericardial effusion. Satisfactory opacification of pulmonary arteries noted, and there is no evidence of pulmonary emboli. Extensive coronary calcifications. Incomplete opacification of the thoracic aorta limiting evaluation for dissection. No aneurysm. Mild scattered calcified atheromatous plaque in the arch and descending thoracic aorta. Mediastinum/Nodes: No mediastinal hematoma, mass, or adenopathy. Subcentimeter right paratracheal and precarinal lymph nodes are noted. Lungs/Pleura: Moderate right and smaller left pleural effusions. No pneumothorax. Patchy atelectasis/consolidation in the lung bases right greater than left. 7 mm nodule, medial basal segment left lower lobe (6:99) . Upper Abdomen: No acute findings. Musculoskeletal: No chest wall abnormality. No acute or significant osseous findings. Review of the MIP images confirms the above findings. IMPRESSION: 1. Negative for acute PE or thoracic aortic dissection. 2. Moderate right and smaller left pleural effusions with bibasilar atelectasis/consolidation. 3. 7 mm left lower lobe nodule. Non-contrast chest CT at 6-12 months is recommended. If the nodule is stable at  time of repeat CT, then future CT at 18-24 months (from today's scan) is considered optional for low-risk patients, but is recommended for high-risk patients. This recommendation follows the consensus statement: Guidelines for Management of Incidental Pulmonary Nodules Detected on CT  Images: From the Fleischner Society 2017; Radiology 2017; 284:228-243. 4.  Aortic Atherosclerosis (ICD10-I70.0). Electronically Signed   By: JONETTA Faes M.D.   On: 04/14/2024 10:39   DG Chest Port 1 View Result Date: 04/14/2024 CLINICAL DATA:  Shortness of breath EXAM: PORTABLE CHEST 1 VIEW COMPARISON:  12/27/2018 FINDINGS: Cardiac enlargement. Low lung volumes. Blunting of both costophrenic angles. Pulmonary vascular congestion without frank edema. No airspace opacities. Mild platelike atelectasis in the lower lungs. Osseous structures appear intact. IMPRESSION: 1. Cardiac enlargement and pulmonary vascular congestion. 2. Low lung volumes and mild platelike atelectasis in the lower lungs. Electronically Signed   By: Waddell Calk M.D.   On: 04/14/2024 09:26    Microbiology: Results for orders placed or performed in visit on 09/16/21  Microscopic Examination     Status: Abnormal   Collection Time: 09/16/21 10:23 AM   Urine  Result Value Ref Range Status   WBC, UA 6-10 (A) 0 - 5 /hpf Final   RBC, Urine None seen 0 - 2 /hpf Final   Epithelial Cells (non renal) 0-10 0 - 10 /hpf Final   Renal Epithel, UA None seen None seen /hpf Final   Bacteria, UA None seen None seen/Few Final    Labs: CBC: Recent Labs  Lab 04/14/24 0903 04/16/24 1923 04/17/24 0427  WBC 10.7* 8.3 9.7  NEUTROABS 8.0* 5.5  --   HGB 11.0* 10.2* 10.1*  HCT 34.3* 32.1* 33.4*  MCV 84.5 83.8 84.6  PLT 340 292 305   Basic Metabolic Panel: Recent Labs  Lab 04/14/24 0903 04/16/24 1923 04/17/24 0427 04/20/24 0248  NA 138 137 140 136  K 4.1 3.7 3.8 3.4*  CL 104 103 104 103  CO2 21* 23 24 27   GLUCOSE 157* 69* 113* 135*  BUN 32* 35* 35* 25*  CREATININE 1.50* 1.32* 1.28* 1.19  CALCIUM  8.7* 8.4* 8.7* 8.3*  MG  --   --  2.2  --   PHOS  --   --  4.4  --    Liver Function Tests: Recent Labs  Lab 04/14/24 0903 04/16/24 1923 04/17/24 0427  AST 15 21 22   ALT 35 36 38  ALKPHOS 66 62 60  BILITOT 0.9 0.9 0.6  PROT  6.8 6.3* 6.5  ALBUMIN  3.7 3.4* 3.5   CBG: Recent Labs  Lab 04/19/24 1605 04/19/24 2049 04/20/24 0026 04/20/24 0746 04/20/24 1137  GLUCAP 198* 245* 189* 142* 157*    Discharge time spent: greater than 30 minutes.  Signed: Eric Nunnery, MD [facility] 04/20/2024

## 2024-04-20 NOTE — Progress Notes (Signed)
 All patient belonging have been returned to the patient. Discharge instructions have been given. Patient has no further questions.

## 2024-04-22 ENCOUNTER — Telehealth: Payer: Self-pay | Admitting: Cardiology

## 2024-04-22 ENCOUNTER — Telehealth: Payer: Self-pay

## 2024-04-22 NOTE — Telephone Encounter (Signed)
 Left message to return call

## 2024-04-22 NOTE — Transitions of Care (Post Inpatient/ED Visit) (Signed)
   04/22/2024  Name: KASEN ADDUCI MRN: [id number] DOB: July 03, 1961  Today's TOC FU Call Status: Today's TOC FU Call Status:: Unsuccessful Call (1st Attempt) Unsuccessful Call (1st Attempt) Date: 04/22/24  Attempted to reach the patient regarding the most recent Inpatient/ED visit.  Follow Up Plan: Additional outreach attempts will be made to reach the patient to complete the Transitions of Care (Post Inpatient/ED visit) call.    Carollee Nussbaumer J. Stamatia Masri RN, MSN [facility], [facility] RN Care Manager Direct Dial: [phone]  Fax: [phone] Website: [email or web address]

## 2024-04-22 NOTE — Telephone Encounter (Signed)
 Pt c/o medication issue:  1. Name of Medication: torsemide  40 MG TABS   2. How are you currently taking this medication (dosage and times per day)?   Take 40 mg by mouth 2 (two) times daily.    3. Are you having a reaction (difficulty breathing--STAT)?   4. What is your medication issue? Patient states he was prescribed this medication while in the hospital, but insurance will not cover it.  He would like something else prescribed for him.

## 2024-04-23 ENCOUNTER — Telehealth: Payer: Self-pay

## 2024-04-23 MED ORDER — TORSEMIDE 20 MG PO TABS: 40.0000 mg | ORAL_TABLET | Freq: Two times a day (BID) | ORAL | 6 refills | Status: DC

## 2024-04-23 NOTE — Telephone Encounter (Signed)
 Spoke with patient, typically the 20mg  tab is covered.  Will send new prescription to pharmacy now - [facility].  He will let us  know if have any further issues.

## 2024-04-23 NOTE — Telephone Encounter (Signed)
 Pt returning call to a nurse

## 2024-04-23 NOTE — Transitions of Care (Post Inpatient/ED Visit) (Signed)
   04/23/2024  Name: Albert [name] MRN: [id number] DOB: 10-Aug-1961  Today's TOC FU Call Status: Today's TOC FU Call Status:: Unsuccessful Call (2nd Attempt) Unsuccessful Call (2nd Attempt) Date: 04/23/24  Attempted to reach the patient regarding the most recent Inpatient/ED visit.  Follow Up Plan: Additional outreach attempts will be made to reach the patient to complete the Transitions of Care (Post Inpatient/ED visit) call.   Maylani Embree J. Shandale Malak RN, MSN [facility], [facility] RN Care Manager Direct Dial: [phone]  Fax: [phone] Website: [email or web address]

## 2024-04-24 ENCOUNTER — Telehealth: Payer: Self-pay

## 2024-04-24 DIAGNOSIS — H25813 Combined forms of age-related cataract, bilateral: Secondary | ICD-10-CM | POA: Diagnosis not present

## 2024-04-24 DIAGNOSIS — E119 Type 2 diabetes mellitus without complications: Secondary | ICD-10-CM | POA: Diagnosis not present

## 2024-04-24 NOTE — Transitions of Care (Post Inpatient/ED Visit) (Signed)
   04/24/2024  Name: Albert [name] MRN: [id number] DOB: [birth year]  Today's TOC FU Call Status: Today's TOC FU Call Status:: Unsuccessful Call (3rd Attempt) Unsuccessful Call (3rd Attempt) Date: 04/24/24  Attempted to reach the patient regarding the most recent Inpatient/ED visit.  Follow Up Plan: No further outreach attempts will be made at this time. We have been unable to contact the patient.  Cadince Hilscher J. Dorri Ozturk RN, MSN [facility], [facility] Health RN Care Manager Direct Dial: [phone]  Fax: [phone] Website: [email or web address]

## 2024-04-25 ENCOUNTER — Telehealth: Payer: Self-pay | Admitting: Cardiology

## 2024-04-25 ENCOUNTER — Encounter: Admitting: Family

## 2024-04-25 ENCOUNTER — Telehealth: Payer: Self-pay

## 2024-04-25 DIAGNOSIS — E782 Mixed hyperlipidemia: Secondary | ICD-10-CM | POA: Diagnosis not present

## 2024-04-25 DIAGNOSIS — E1369 Other specified diabetes mellitus with other specified complication: Secondary | ICD-10-CM | POA: Diagnosis not present

## 2024-04-25 NOTE — Telephone Encounter (Signed)
 Pt c/o medication issue:  1. Name of Medication:   torsemide  (DEMADEX ) 20 MG tablet    2. How are you currently taking this medication (dosage and times per day)?  As prescribed  3. Are you having a reaction (difficulty breathing--STAT)?   4. What is your medication issue?    Patient says he isn't retaining fluid. He's actually losing weight and would like to know if he needs to stop taking it. Yesterday he weighed 257.6 lbs and today he weighs 250.6 lbs. Please advise.

## 2024-04-25 NOTE — Patient Instructions (Signed)
 Visit Information  Thank you for taking time to visit with me today. Please don't hesitate to contact me if I can be of assistance to you before our next scheduled telephone appointment.  Our next appointment is by telephone on 05/03/24 at 1000 am  Following is a copy of your care plan:   Goals Addressed             This Visit's Progress    VBCI Transitions of Care (TOC) Care Plan       Problems:  Recent Hospitalization for treatment of CHF Knowledge Deficit Related to heart failure management  04/25/24- Patient returned call. He reports doing better from recent hospitalization. He shares that he called the cardiologist to ask if he should take torsemide  twice a day due to more weight loss.  Weight 251 lbs today from 257 lbs yesterday.  Reviewed medications and advised to continue to take twice a day until he hears back. He verbalized understanding.  He has follow up appointments set.  Reviewed heart failure management.  Discussed 30 day TOC program, he is agreeable to program.   Goal:  Over the next 30 days, the patient will not experience hospital readmission  Interventions:  Transitions of Care: Doctor Visits  - discussed the importance of doctor visits  Heart Failure Interventions: Basic overview and discussion of pathophysiology of Heart Failure reviewed Provided education on low sodium diet Reviewed Heart Failure Action Plan in depth and provided written copy Assessed need for readable accurate scales in home Provided education about placing scale on hard, flat surface Advised patient to weigh each morning after emptying bladder Discussed importance of daily weight and advised patient to weigh and record daily Reviewed role of diuretics in prevention of fluid overload and management of heart failure; Discussed the importance of keeping all appointments with provider  Patient Self Care Activities:  Attend all scheduled provider appointments Call provider office for new  concerns or questions  Notify RN Care Manager of TOC call rescheduling needs Participate in Transition of Care Program/Attend TOC scheduled calls Take medications as prescribed   call office if I gain more than 2 pounds in one day or 5 pounds in one week keep legs up while sitting track weight in diary use salt in moderation watch for swelling in feet, ankles and legs every day weigh myself daily follow rescue plan if symptoms flare-up  Plan:  Telephone follow up appointment with care management team member scheduled for:  05/03/24        Patient verbalizes understanding of instructions and care plan provided today and agrees to view in MyChart. Active MyChart status and patient understanding of how to access instructions and care plan via MyChart confirmed with patient.     The patient has been provided with contact information for the care management team and has been advised to call with any health related questions or concerns.   Please call the care guide team at [phone] if you need to cancel or reschedule your appointment.   Please call the Suicide and Crisis Lifeline: 988 if you are experiencing a Mental Health or Behavioral Health Crisis or need someone to talk to.  Trini Soldo J. Zayah Keilman RN, MSN [facility], [facility] RN Care Manager Direct Dial: [phone]  Fax: [phone] Website: [email or web address]

## 2024-04-25 NOTE — Transitions of Care (Post Inpatient/ED Visit) (Signed)
 04/25/2024  Name: Albert [name] MRN: [id number] DOB: 15-May-1961  Today's TOC FU Call Status: Today's TOC FU Call Status:: Successful TOC FU Call Completed TOC FU Call Complete Date: 04/25/24 Patient's Name and Date of Birth confirmed.  Transition Care Management Follow-up Telephone Call Date of Discharge: 04/20/24 [facility] Type of Discharge: Inpatient Admission Primary Inpatient Discharge Diagnosis:: Acute on Chronic Diastolic CHF How have you been since you were released from the hospital?: Better  Items Reviewed: Medications obtained,verified, and reconciled?: Yes (Medications Reviewed) Any new allergies since your discharge?: No Dietary orders reviewed?: Yes Type of Diet Ordered:: Carb Modified low sodium Do you have support at home?: Yes People in Home [RPT]: child(ren), dependent Name of Support/Comfort Primary Source: [place]  Medications Reviewed Today: Medications Reviewed Today     Reviewed by Amaru Burroughs, RN (Case Manager) on 04/25/24 at 1151  Med List Status: <None>   Medication Order Taking? Sig Documenting Provider Last Dose Status Informant  ACCU-CHEK GUIDE TEST test strip [id number] Yes SMARTSIG:Strip(s) [provider]  Active Self, Pharmacy Records  aspirin  EC 81 MG tablet [id number]  Take 1 tablet (81 mg total) by mouth daily. Antoinette Doe, MD  Active Self, Pharmacy Records  atorvastatin  (LIPITOR ) 40 MG tablet [id number]  Take 40 mg by mouth daily. [provider]  Active Self, Pharmacy Records  carvedilol  (COREG ) 6.25 MG tablet [id number]  Take 1 tablet (6.25 mg total) by mouth 2 (two) times daily with a meal. Ricky Fines, MD  Active   clotrimazole -betamethasone  (LOTRISONE ) cream [id number]  Apply 1 application topically 2 (two) times daily.  Patient taking differently: Apply 1 application  topically daily as needed (rash).   Watt Rush, MD  Active Self, Pharmacy Records  dapagliflozin  propanediol (FARXIGA )  10 MG TABS tablet [id number]  Take 1 tablet (10 mg total) by mouth daily. Ricky Fines, MD  Active   gabapentin (NEURONTIN) 300 MG capsule [id number]  Take 300 mg by mouth 3 (three) times daily as needed (pain). Take 1 capsule 3 times a day by oral route as needed. [provider]  Active Self, Pharmacy Records  insulin  lispro (HUMALOG) 100 UNIT/ML KwikPen [id number]  Inject 20 Units into the skin 3 (three) times daily before meals.  Patient taking differently: Inject 25 Units into the skin 3 (three) times daily before meals.   [provider]  Active Self, Pharmacy Records  metFORMIN  (GLUCOPHAGE -XR) 500 MG 24 hr tablet [id number]  Take 1 tablet (500 mg total) by mouth daily with breakfast. Ricky Fines, MD  Active   Multiple Vitamins-Minerals (MENS 50+ MULTIVITAMIN) TABS [id number]  Take 1 tablet by mouth daily. [provider]  Active Self, Pharmacy Records  nitroGLYCERIN  (NITRODUR - DOSED IN MG/24 HR) 0.2 mg/hr patch [id number]  APPLY 1 PATCH ONTO SKIN  DAILY  Patient taking differently: Place 0.2 mg onto the skin daily as needed (promote blood flow). Apply to left foot   Barbarann Oneil BROCKS, MD  Active Self, Pharmacy Records           Med Note SOILA LYLE BROCKS Sonjia Aug 02, 2022 10:51 AM) Used on left foot  potassium chloride  (K-DUR,KLOR-CON ) 10 MEQ tablet [id number]  Take 1 tablet (10 mEq total) by mouth daily. Alvan Dorn FALCON, MD  Active Self, Pharmacy Records  sacubitril -valsartan  (ENTRESTO ) 24-26 MG [id number]  Take 1 tablet by mouth 2 (two) times daily. Ricky Fines, MD  Active   torsemide  (DEMADEX ) 20 MG tablet [id number]  Take 2 tablets (  40 mg total) by mouth 2 (two) times daily. Alvan Dorn FALCON, MD  Active   TRESIBA FLEXTOUCH 100 UNIT/ML SOPN FlexTouch Pen [id number]  Inject 57 Units into the skin at bedtime. [provider]  Active Self, Pharmacy Records            Home Care and Equipment/Supplies: Were Home Health Services Ordered?: NA Any new  equipment or medical supplies ordered?: NA  Functional Questionnaire: Do you need assistance with bathing/showering or dressing?: No Do you need assistance with meal preparation?: No Do you need assistance with eating?: No Do you have difficulty maintaining continence: No Do you need assistance with getting out of bed/getting out of a chair/moving?: No Do you have difficulty managing or taking your medications?: No  Follow up appointments reviewed: PCP Follow-up appointment confirmed?: Yes Date of PCP follow-up appointment?: 05/01/24 Follow-up Provider: [facility] Follow-up appointment confirmed?: Yes Date of Specialist follow-up appointment?: 05/02/24 Follow-Up Specialty Provider:: [facility] Do you need transportation to your follow-up appointment?: No Do you understand care options if your condition(s) worsen?: Yes-patient verbalized understanding  SDOH Interventions Today    Flowsheet Row Most Recent Value  SDOH Interventions   Food Insecurity Interventions Intervention Not Indicated  Housing Interventions Intervention Not Indicated  Transportation Interventions Intervention Not Indicated  Utilities Interventions Intervention Not Indicated    Goals Addressed             This Visit's Progress    VBCI Transitions of Care (TOC) Care Plan       Problems:  Recent Hospitalization for treatment of CHF Knowledge Deficit Related to heart failure management  04/25/24- Patient returned call. He reports doing better from recent hospitalization. He shares that he called the cardiologist to ask if he should take torsemide  twice a day due to more weight loss.  Weight 251 lbs today from 257 lbs yesterday.  Reviewed medications and advised to continue to take twice a day until he hears back. He verbalized understanding.  He has follow up appointments set.  Reviewed heart failure management.  Discussed 30 day TOC program, he is agreeable to program.   Goal:  Over  the next 30 days, the patient will not experience hospital readmission  Interventions:  Transitions of Care: Doctor Visits  - discussed the importance of doctor visits  Heart Failure Interventions: Basic overview and discussion of pathophysiology of Heart Failure reviewed Provided education on low sodium diet Reviewed Heart Failure Action Plan in depth and provided written copy Assessed need for readable accurate scales in home Provided education about placing scale on hard, flat surface Advised patient to weigh each morning after emptying bladder Discussed importance of daily weight and advised patient to weigh and record daily Reviewed role of diuretics in prevention of fluid overload and management of heart failure; Discussed the importance of keeping all appointments with provider  Patient Self Care Activities:  Attend all scheduled provider appointments Call provider office for new concerns or questions  Notify RN Care Manager of TOC call rescheduling needs Participate in Transition of Care Program/Attend TOC scheduled calls Take medications as prescribed   call office if I gain more than 2 pounds in one day or 5 pounds in one week keep legs up while sitting track weight in diary use salt in moderation watch for swelling in feet, ankles and legs every day weigh myself daily follow rescue plan if symptoms flare-up  Plan:  Telephone follow up appointment with care management team member scheduled for:  05/03/24         Damare Serano J. Aireonna Bauer RN, MSN [place]  [facility], [facility] RN Care Manager Direct Dial: [phone]  Fax: [phone] Website: [email or web address]

## 2024-04-26 MED ORDER — TORSEMIDE 20 MG PO TABS: 20.0000 mg | ORAL_TABLET | Freq: Every day | ORAL | 1 refills | Status: DC

## 2024-04-26 NOTE — Telephone Encounter (Signed)
 Patient is only taking (1) 20 mg tablet BID but is still losing right much weight. Patient will hold torsemide  until Sunday and I will route to MD to see what dose he wants him to restart

## 2024-04-26 NOTE — Telephone Encounter (Signed)
 Clarify taking torsemide  40mg  bid. If so can hold doses until Sunday and then start torsemide  just 40mg  daily. Update us  mid next week how he is doing  JINNY Ross MD

## 2024-04-26 NOTE — Telephone Encounter (Signed)
 Patient informed and verbalized understanding of plan.

## 2024-04-26 NOTE — Telephone Encounter (Signed)
 Per Dr.Branch on Sunday 7/13 resume Torsemide  at 20 Mg daily  Dosing changed and new Rx Sent to patient pharmacy

## 2024-04-26 NOTE — Telephone Encounter (Signed)
Pt calling back for a update.

## 2024-04-30 ENCOUNTER — Ambulatory Visit: Attending: Cardiology

## 2024-04-30 ENCOUNTER — Other Ambulatory Visit

## 2024-04-30 DIAGNOSIS — I6523 Occlusion and stenosis of bilateral carotid arteries: Secondary | ICD-10-CM

## 2024-05-01 ENCOUNTER — Other Ambulatory Visit (HOSPITAL_COMMUNITY): Payer: Self-pay | Admitting: Internal Medicine

## 2024-05-01 ENCOUNTER — Telehealth: Payer: Self-pay | Admitting: Family

## 2024-05-01 DIAGNOSIS — R809 Proteinuria, unspecified: Secondary | ICD-10-CM | POA: Insufficient documentation

## 2024-05-01 DIAGNOSIS — R945 Abnormal results of liver function studies: Secondary | ICD-10-CM | POA: Insufficient documentation

## 2024-05-01 DIAGNOSIS — I5042 Chronic combined systolic (congestive) and diastolic (congestive) heart failure: Secondary | ICD-10-CM | POA: Diagnosis not present

## 2024-05-01 DIAGNOSIS — Z89511 Acquired absence of right leg below knee: Secondary | ICD-10-CM | POA: Diagnosis not present

## 2024-05-01 DIAGNOSIS — J90 Pleural effusion, not elsewhere classified: Secondary | ICD-10-CM | POA: Diagnosis not present

## 2024-05-01 DIAGNOSIS — I10 Essential (primary) hypertension: Secondary | ICD-10-CM | POA: Diagnosis not present

## 2024-05-01 DIAGNOSIS — D649 Anemia, unspecified: Secondary | ICD-10-CM | POA: Diagnosis not present

## 2024-05-01 DIAGNOSIS — E782 Mixed hyperlipidemia: Secondary | ICD-10-CM | POA: Diagnosis not present

## 2024-05-01 DIAGNOSIS — H269 Unspecified cataract: Secondary | ICD-10-CM | POA: Insufficient documentation

## 2024-05-01 DIAGNOSIS — E1369 Other specified diabetes mellitus with other specified complication: Secondary | ICD-10-CM | POA: Diagnosis not present

## 2024-05-01 DIAGNOSIS — N182 Chronic kidney disease, stage 2 (mild): Secondary | ICD-10-CM | POA: Diagnosis not present

## 2024-05-01 NOTE — Telephone Encounter (Signed)
 Called to confirm/remind patient of their appointment at the [facility] on 05/02/24.   Appointment:   [] Confirmed  [x] Left mess   [] No answer/No voice mail  [] VM Full/unable to leave message  [] Phone not in service  Patient reminded to bring all medications and/or complete list.  Confirmed patient has transportation. Gave directions, instructed to utilize valet parking.

## 2024-05-01 NOTE — Progress Notes (Unsigned)
 [facility] Note   Referring Physician: admission 07/25 PCP: [name], MD Cardiologist: Alvan Carrier, MD   Chief Complaint: HF visit   HPI:  Albert [name] is a 63 y/o male with a history of HTN, PVD/ right BKA, bilateral pleural effusions, T2DM, obesity, hyperlipidemia, CAD, CVA and chronic heart failure. Prior echo in 2018 demonstrating ejection fraction 50% with grade 1 diastolic dysfunction.   Was in the ED 04/14/24 with leg swelling and shortness of breath. Given IV lasix  and told to double oral lasix .   Admitted 04/16/24 with increased swelling of LLE and worsening shortness of breath on exertion despite being compliant with diuretics. In the emergency department, he was hemodynamically stable, though BP was soft at 98/74. Workup in the ED showed normocytic anemia, BMP was normal except for blood glucose of 69, urine/creatinine/1.32 (creatinine is within baseline range), albumin  3.4, BNP 862, troponin 37 > 38. Chest x-ray showed stable cardiomegaly and low lung volumes with mild bibasilar linear atelectasis. Small bilateral pleural effusions. He was treated with IV Lasix  40 mg x 1. Echo 04/17/24: EF 30-35% with mild LVH, normal RV, moderately elevated PA pressure of 47.7 mmHg, small/ moderate pericardial effusion, mild/ moderate Albert. Elevated troponin thought to be due to demand ischemia.   He presents today with a chief complaint of an initial HF visit. Currently voices no complaints and says that he feels great since his recent admission. Weighing daily. Keeping daily sodium intake to 2000mg  and fluid intake to 60-64 oz.   Uses a cane when walking away from home. Has right BKA with prosthesis and left mid foot amputation.    Review of Systems: [y] = yes, [ ]  = no   General: Weight gain [ ] ; Weight loss [ ] ; Anorexia [ ] ; Fatigue [ ] ; Fever [ ] ; Chills [ ] ; Weakness [ ]   Cardiac: Chest pain/pressure [ ] ; Resting SOB [ ] ; Exertional SOB [ ] ; Orthopnea [ ] ; Pedal Edema  [ ] ; Palpitations [ ] ; Syncope [ ] ; Presyncope [ ] ; Paroxysmal nocturnal dyspnea[ ]   Pulmonary: Cough [ ] ; Wheezing[ ] ; Hemoptysis[ ] ; Sputum [ ] ; Snoring [ ]   GI: Vomiting[ ] ; Dysphagia[ ] ; Melena[ ] ; Hematochezia [ ] ; Heartburn[ ] ; Abdominal pain [ ] ; Constipation [ ] ; Diarrhea [ ] ; BRBPR [ ]   GU: Hematuria[ ] ; Dysuria [ ] ; Nocturia[ ]   Vascular: Pain in legs with walking [ ] ; Pain in feet with lying flat [ ] ; Non-healing sores [ ] ; Stroke [ ] ; TIA [ ] ; Slurred speech [ ] ;  Neuro: Headaches[ ] ; Vertigo[ ] ; Seizures[ ] ; Paresthesias[ ] ;Blurred vision [ ] ; Diplopia [ ] ; Vision changes [ ]   Ortho/Skin: Arthritis [ ] ; Joint pain [ ] ; Muscle pain [ ] ; Joint swelling [ ] ; Back Pain [ ] ; Rash [ ]   Psych: Depression[ ] ; Anxiety[ ]   Heme: Bleeding problems [ ] ; Clotting disorders [ ] ; Anemia [ ]   Endocrine: Diabetes [ y]; Thyroid dysfunction[ ]    Past Medical History:  Diagnosis Date   Allergic rhinitis    Anemia    Chronic cough    Chronic sinusitis    Complication of anesthesia    couldn't swallow and talk   Coronary artery disease    CVA (cerebral infarction) 2011   R hand deficit   Diabetes mellitus without complication (HCC) ~2000   last HbA1c ~9 .... fasting 160s   GERD (gastroesophageal reflux disease)    Heart murmur    Hypercholesteremia    Hypertension  Myocardial infarction [facility]) 2014   Osteomyelitis (HCC)    first and third metatarsal   Pneumonia    Splenic infarct    setting of lupus anticoagulant    Current Outpatient Medications  Medication Sig Dispense Refill   ACCU-CHEK GUIDE TEST test strip SMARTSIG:Strip(s)     aspirin  EC 81 MG tablet Take 1 tablet (81 mg total) by mouth daily. 30 tablet 1   atorvastatin  (LIPITOR ) 40 MG tablet Take 40 mg by mouth daily.     carvedilol  (COREG ) 6.25 MG tablet Take 1 tablet (6.25 mg total) by mouth 2 (two) times daily with a meal. 60 tablet 2   clotrimazole -betamethasone  (LOTRISONE ) cream Apply 1 application topically 2 (two)  times daily. 30 g 2   dapagliflozin  propanediol (FARXIGA ) 10 MG TABS tablet Take 1 tablet (10 mg total) by mouth daily. 30 tablet 3   gabapentin (NEURONTIN) 300 MG capsule Take 300 mg by mouth 3 (three) times daily as needed (pain). Take 1 capsule 3 times a day by oral route as needed.     insulin  lispro (HUMALOG) 100 UNIT/ML KwikPen Inject 20 Units into the skin 3 (three) times daily before meals.     metFORMIN  (GLUCOPHAGE -XR) 500 MG 24 hr tablet Take 1 tablet (500 mg total) by mouth daily with breakfast.     Multiple Vitamins-Minerals (MENS 50+ MULTIVITAMIN) TABS Take 1 tablet by mouth daily.     nitroGLYCERIN  (NITRODUR - DOSED IN MG/24 HR) 0.2 mg/hr patch APPLY 1 PATCH ONTO SKIN  DAILY 90 patch 3   potassium chloride  (K-DUR,KLOR-CON ) 10 MEQ tablet Take 1 tablet (10 mEq total) by mouth daily. 30 tablet 6   sacubitril -valsartan  (ENTRESTO ) 24-26 MG Take 1 tablet by mouth 2 (two) times daily. 60 tablet 2   torsemide  (DEMADEX ) 20 MG tablet Take 1 tablet (20 mg total) by mouth daily. 90 tablet 1   TRESIBA FLEXTOUCH 100 UNIT/ML SOPN FlexTouch Pen Inject 57 Units into the skin at bedtime.  6   No current facility-administered medications for this visit.    Allergies  Allergen Reactions   Amoxicillin Nausea And Vomiting           Social History   Socioeconomic History   Marital status: Divorced    Spouse name: Not on file   Number of children: 2   Years of education: Not on file   Highest education level: Some college, no degree  Occupational History   Occupation: Museum/gallery curator: [employer]    Comment: Disabled  Tobacco Use   Smoking status: Never   Smokeless tobacco: Never  Vaping Use   Vaping status: Never Used  Substance and Sexual Activity   Alcohol use: No    Alcohol/week: 0.0 standard drinks of alcohol   Drug use: No   Sexual activity: Not Currently  Other Topics Concern   Not on file  Social History Narrative   Not on file   Social  Drivers of Health   Financial Resource Strain: Medium Risk (04/18/2024)   Overall Financial Resource Strain (CARDIA)    Difficulty of Paying Living Expenses: Somewhat hard  Food Insecurity: No Food Insecurity (04/25/2024)   Hunger Vital Sign    Worried About Running Out of Food in the Last Year: Never true    Ran Out of Food in the Last Year: Never true  Transportation Needs: No Transportation Needs (04/25/2024)   PRAPARE - Transportation    Lack of Transportation (Medical): No    Lack of  Transportation (Non-Medical): No  Physical Activity: Not on file  Stress: Not on file  Social Connections: Not on file  Intimate Partner Violence: Not At Risk (04/25/2024)   Humiliation, Afraid, Rape, and Kick questionnaire    Fear of Current or Ex-Partner: No    Emotionally Abused: No    Physically Abused: No    Sexually Abused: No      Family History  Problem Relation Age of Onset   Pancreatic cancer Mother    Diabetes Mother    Hyperlipidemia Mother    Breast cancer Sister    Depression Maternal Grandmother    Heart disease Maternal Grandmother    Depression Maternal Grandfather    Heart disease Maternal Grandfather    Depression Paternal Grandmother    Heart disease Paternal Grandmother    Depression Paternal Grandfather    Heart disease Paternal Grandfather    Diabetes Other    Colon cancer Neg Hx    Vitals:   05/02/24 0941  BP: 100/69  Pulse: 68  SpO2: 97%  Weight: 252 lb (114.3 kg)   Wt Readings from Last 3 Encounters:  05/02/24 252 lb (114.3 kg)  04/25/24 251 lb (113.9 kg)  04/20/24 259 lb 4.2 oz (117.6 kg)   Lab Results  Component Value Date   CREATININE 1.19 04/20/2024   CREATININE 1.28 (H) 04/17/2024   CREATININE 1.32 (H) 04/16/2024    PHYSICAL EXAM:  General: Well appearing, walking w/ cane.  Cor: No JVD. Regular rhythm, rate.  Lungs: clear Abdomen: soft, nontender, nondistended. Extremities: trace edema left lower leg; prosthesis on right BKA Neuro:. Affect  pleasant   ECG: not done   ASSESSMENT & PLAN:  1: ICM with reduced ejection fraction- - cath 06/2013 (results below) - NYHA class I - euvolemic - weighing daily & parameters to call about discussed - Echo in 2018 demonstrating ejection fraction 50% with grade 1 diastolic dysfunction.  - Echo 11/23/72: EF 30-35% with mild LVH, normal RV, moderately elevated PA pressure of 47.7 mmHg, small/ moderate pericardial effusion, mild/ moderate Albert. - continue carvedilol  6.25mg  BID - continue farxiga  10mg  daily - continue entresto  24/26mg  BID - continue torsemide  20mg  daily/ potassium 10meq daily - BP too low today to start MRA - BNP 04/16/24 was 862.0  2: HTN- - BP 100/69; no dizziness - saw PCP Regino) 12/24 - BMET 04/20/24 reviewed: sodium 136, potassium 3.4, creatinine 1.10 & GFR >60 - BMET today  3: CAD- - saw cardiology (Branch) 06/25 - cath 06/2013: LM patent, LAD 40% prox, 70% mid follow by 60% lesion, distal 90%. Diag 99%. LCX 80-90% prox, OM1 80%, OM2 99%, OM3 99 % (all OMs are small), RCA small non-dom mid 99%. LVEF 40% by LVgram.     4: DM- - A1c 04/20/24 was 7.3%   5: Hyperlipidemia- - LDL 12/08/12 was 54 - lipid panel today  6: PAD- - previous right BKA, previous multiple toe amputations. 08/30/14 treated for osteo and ulcer of left great toe with amputation.  - Jan 2016 had left midfoot amputation.      Return in 6 weeks, sooner if needed.   Ellouise DELENA Class, FNP 05/01/24

## 2024-05-02 ENCOUNTER — Ambulatory Visit: Attending: Family | Admitting: Family

## 2024-05-02 ENCOUNTER — Telehealth: Payer: Self-pay | Admitting: Cardiology

## 2024-05-02 ENCOUNTER — Encounter: Payer: Self-pay | Admitting: Family

## 2024-05-02 ENCOUNTER — Ambulatory Visit: Payer: Self-pay | Admitting: Cardiology

## 2024-05-02 VITALS — BP 100/69 | HR 68 | Wt 252.0 lb

## 2024-05-02 DIAGNOSIS — I110 Hypertensive heart disease with heart failure: Secondary | ICD-10-CM | POA: Diagnosis not present

## 2024-05-02 DIAGNOSIS — I255 Ischemic cardiomyopathy: Secondary | ICD-10-CM | POA: Diagnosis not present

## 2024-05-02 DIAGNOSIS — Z89412 Acquired absence of left great toe: Secondary | ICD-10-CM | POA: Diagnosis not present

## 2024-05-02 DIAGNOSIS — Z794 Long term (current) use of insulin: Secondary | ICD-10-CM | POA: Diagnosis not present

## 2024-05-02 DIAGNOSIS — I5032 Chronic diastolic (congestive) heart failure: Secondary | ICD-10-CM | POA: Diagnosis not present

## 2024-05-02 DIAGNOSIS — R7989 Other specified abnormal findings of blood chemistry: Secondary | ICD-10-CM | POA: Diagnosis not present

## 2024-05-02 DIAGNOSIS — I5022 Chronic systolic (congestive) heart failure: Secondary | ICD-10-CM | POA: Insufficient documentation

## 2024-05-02 DIAGNOSIS — I10 Essential (primary) hypertension: Secondary | ICD-10-CM | POA: Diagnosis not present

## 2024-05-02 DIAGNOSIS — I340 Nonrheumatic mitral (valve) insufficiency: Secondary | ICD-10-CM | POA: Diagnosis not present

## 2024-05-02 DIAGNOSIS — I2510 Atherosclerotic heart disease of native coronary artery without angina pectoris: Secondary | ICD-10-CM | POA: Insufficient documentation

## 2024-05-02 DIAGNOSIS — Z89421 Acquired absence of other right toe(s): Secondary | ICD-10-CM | POA: Insufficient documentation

## 2024-05-02 DIAGNOSIS — E1151 Type 2 diabetes mellitus with diabetic peripheral angiopathy without gangrene: Secondary | ICD-10-CM | POA: Diagnosis not present

## 2024-05-02 DIAGNOSIS — E782 Mixed hyperlipidemia: Secondary | ICD-10-CM

## 2024-05-02 DIAGNOSIS — E785 Hyperlipidemia, unspecified: Secondary | ICD-10-CM | POA: Insufficient documentation

## 2024-05-02 DIAGNOSIS — Z79899 Other long term (current) drug therapy: Secondary | ICD-10-CM | POA: Diagnosis not present

## 2024-05-02 DIAGNOSIS — Z5986 Financial insecurity: Secondary | ICD-10-CM | POA: Diagnosis not present

## 2024-05-02 DIAGNOSIS — Z7984 Long term (current) use of oral hypoglycemic drugs: Secondary | ICD-10-CM | POA: Diagnosis not present

## 2024-05-02 DIAGNOSIS — I739 Peripheral vascular disease, unspecified: Secondary | ICD-10-CM

## 2024-05-02 NOTE — Patient Instructions (Signed)
 Medication Changes:  No medication changes.   Lab Work:  Go DOWN to LOWER LEVEL (LL) to have your blood work completed inside of [employer] office.  We will only call you if the results are abnormal or if the provider would like to make medication changes.   Special Instructions // Education:  It was nice to meet you today, keep up the good work!   Follow-Up in: 6 weeks with Ellouise Class, FNP.  At the [facility], you and your health needs are our priority. We have a designated team specialized in the treatment of Heart Failure. This Care Team includes your primary Heart Failure Specialized Cardiologist (physician), Advanced Practice Providers (APPs- Physician Assistants and Nurse Practitioners), and Pharmacist who all work together to provide you with the care you need, when you need it.   You may see any of the following providers on your designated Care Team at your next follow up:  Dr. Toribio Fuel Dr. Ezra Shuck Dr. Ria Commander Dr. Odis Brownie Ellouise Class, FNP Jaun Bash, RPH-CPP  Please be sure to bring in all your medications bottles to every appointment.   Need to Contact Us :  If you have any questions or concerns before your next appointment please send us  a message through [place] or call our office at [phone].    TO LEAVE A MESSAGE FOR THE NURSE SELECT OPTION 2, PLEASE LEAVE A MESSAGE INCLUDING: YOUR NAME DATE OF BIRTH CALL BACK NUMBER REASON FOR CALL**this is important as we prioritize the call backs  YOU WILL RECEIVE A CALL BACK THE SAME DAY AS LONG AS YOU CALL BEFORE 4:00 PM

## 2024-05-02 NOTE — Telephone Encounter (Signed)
Pt returning nurses call regarding test results. Please advise

## 2024-05-03 ENCOUNTER — Ambulatory Visit: Payer: Self-pay | Admitting: Family

## 2024-05-03 ENCOUNTER — Other Ambulatory Visit: Payer: Self-pay

## 2024-05-03 LAB — LIPID PANEL
Chol/HDL Ratio: 3.8 ratio (ref 0.0–5.0)
Cholesterol, Total: 107 mg/dL (ref 100–199)
HDL: 28 mg/dL — ABNORMAL LOW (ref >39)
LDL Chol Calc (NIH): 58 mg/dL (ref 0–99)
Triglycerides: 112 mg/dL (ref 0–149)
VLDL Cholesterol Cal: 21 mg/dL (ref 5–40)

## 2024-05-03 LAB — BASIC METABOLIC PANEL WITH GFR
BUN/Creatinine Ratio: 18 (ref 10–24)
BUN: 22 mg/dL (ref 8–27)
CO2: 15 mmol/L — ABNORMAL LOW (ref 20–29)
Calcium: 9.6 mg/dL (ref 8.6–10.2)
Chloride: 104 mmol/L (ref 96–106)
Creatinine, Ser: 1.23 mg/dL (ref 0.76–1.27)
Glucose: 115 mg/dL — ABNORMAL HIGH (ref 70–99)
Potassium: 4.8 mmol/L (ref 3.5–5.2)
Sodium: 138 mmol/L (ref 134–144)
eGFR: 67 mL/min/1.73 (ref >59)

## 2024-05-03 NOTE — Telephone Encounter (Signed)
 Dorn JULIANNA Ross, MD 05/02/2024  3:39 PM EDT     Carotid US  shows just mild plaque on both sides, we will continue to monitor   Albert Ross MD    The patient has been notified of the result and verbalized understanding.  All questions (if any) were answered. Bernett Dorothyann LABOR, RN 05/03/2024 8:47 AM   PCP copied

## 2024-05-03 NOTE — Patient Instructions (Signed)
 Visit Information  Thank you for taking time to visit with me today. Please don't hesitate to contact me if I can be of assistance to you before our next scheduled telephone appointment.  Our next appointment is by telephone on 05/09/24 at 1030 am  Following is a copy of your care plan:   Goals Addressed             This Visit's Progress    [facility] Transitions of Care (TOC) Care Plan       Problems:  Recent Hospitalization for treatment of CHF Knowledge Deficit Related to heart failure management  05/03/24- Patient reports doing good.  Weight 246.8 lbs today.  Reiterated heart failure management.  He verbalized understanding.     Goal:  Over the next 30 days, the patient will not experience hospital readmission  Interventions:  Transitions of Care: Doctor Visits  - discussed the importance of doctor visits  Heart Failure Interventions: Provided education on low sodium diet Reviewed Heart Failure Action Plan in depth and provided written copy Provided education about placing scale on hard, flat surface Advised patient to weigh each morning after emptying bladder Discussed importance of daily weight and advised patient to weigh and record daily Reviewed role of diuretics in prevention of fluid overload and management of heart failure; Discussed the importance of keeping all appointments with provider  Patient Self Care Activities:  Attend all scheduled provider appointments Call provider office for new concerns or questions  Notify RN Care Manager of TOC call rescheduling needs Participate in Transition of Care Program/Attend TOC scheduled calls Take medications as prescribed   call office if I gain more than 2 pounds in one day or 5 pounds in one week keep legs up while sitting track weight in diary use salt in moderation watch for swelling in feet, ankles and legs every day weigh myself daily follow rescue plan if symptoms flare-up  Plan:  Telephone follow up appointment  with care management team member scheduled for:  05/09/24        Patient verbalizes understanding of instructions and care plan provided today and agrees to view in MyChart. Active MyChart status and patient understanding of how to access instructions and care plan via MyChart confirmed with patient.     The patient has been provided with contact information for the care management team and has been advised to call with any health related questions or concerns.   Please call the care guide team at [phone] if you need to cancel or reschedule your appointment.   Please call the Suicide and Crisis Lifeline: 988 if you are experiencing a Mental Health or Behavioral Health Crisis or need someone to talk to.  Jasean Ambrosia J. Blayke Pinera RN, MSN [facility], [facility] RN Care Manager Direct Dial: [phone]  Fax: [phone] Website: [email or web address]

## 2024-05-03 NOTE — Transitions of Care (Post Inpatient/ED Visit) (Signed)
 Transition of Care week 2  Visit Note  05/03/2024  Name: Albert [name] MRN: [id number]          DOB: 22-Oct-1960  Situation: Patient enrolled in [facility] 30-day program. Visit completed with patient by telephone.   Background:   Initial Transition Care Management Follow-up Telephone Call    Past Medical History:  Diagnosis Date   Allergic rhinitis    Anemia    Chronic cough    Chronic sinusitis    Complication of anesthesia    couldn't swallow and talk   Coronary artery disease    CVA (cerebral infarction) 2011   R hand deficit   Diabetes mellitus without complication (HCC) ~2000   last HbA1c ~9 .... fasting 160s   GERD (gastroesophageal reflux disease)    Heart murmur    Hypercholesteremia    Hypertension    Myocardial infarction (HCC) 2014   Osteomyelitis (HCC)    first and third metatarsal   Pneumonia    Splenic infarct    setting of lupus anticoagulant    Assessment: Patient Reported Symptoms: Cognitive Cognitive Status: Alert and oriented to person, place, and time, Normal speech and language skills      Neurological Neurological Review of Symptoms: No symptoms reported    HEENT HEENT Symptoms Reported: No symptoms reported      Cardiovascular Cardiovascular Symptoms Reported: No symptoms reported Weight: 246 lb 12.8 oz (111.9 kg)  Respiratory      Endocrine Endocrine Symptoms Reported: No symptoms reported Is patient diabetic?: Yes Is patient checking blood sugars at home?: Yes List most recent blood sugar readings, include date and time of day: Sugars 96-115 Endocrine Self-Management Outcome: 4 (good)  Gastrointestinal Gastrointestinal Symptoms Reported: No symptoms reported      Genitourinary Genitourinary Symptoms Reported: No symptoms reported    Integumentary Integumentary Symptoms Reported: No symptoms reported    Musculoskeletal Musculoskelatal Symptoms Reviewed: No symptoms reported Additional Musculoskeletal Details: Right BKA with  prosthesis, left mid foot amputation        Psychosocial Psychosocial Symptoms Reported: No symptoms reported         There were no vitals filed for this visit.  Medications Reviewed Today     Reviewed by Shatira Dobosz, RN (Case Manager) on 05/03/24 at 1009  Med List Status: <None>   Medication Order Taking? Sig Documenting Provider Last Dose Status Informant  ACCU-CHEK GUIDE TEST test strip [id number] Yes SMARTSIG:Strip(s) [provider]  Active Self, Pharmacy Records  aspirin  EC 81 MG tablet [id number] Yes Take 1 tablet (81 mg total) by mouth daily. Antoinette Doe, MD  Active Self, Pharmacy Records  atorvastatin  (LIPITOR ) 40 MG tablet [id number] Yes Take 40 mg by mouth daily. [provider]  Active Self, Pharmacy Records  carvedilol  (COREG ) 6.25 MG tablet [id number] Yes Take 1 tablet (6.25 mg total) by mouth 2 (two) times daily with a meal. Ricky Fines, MD  Active   clotrimazole -betamethasone  (LOTRISONE ) cream [id number] Yes Apply 1 application topically 2 (two) times daily. Watt Rush, MD  Active Self, Pharmacy Records  dapagliflozin  propanediol (FARXIGA ) 10 MG TABS tablet [id number] Yes Take 1 tablet (10 mg total) by mouth daily. Ricky Fines, MD  Active   gabapentin (NEURONTIN) 300 MG capsule [id number] Yes Take 300 mg by mouth 3 (three) times daily as needed (pain). Take 1 capsule 3 times a day by oral route as needed. [provider]  Active Self, Pharmacy Records  insulin  lispro (HUMALOG) 100 UNIT/ML KwikPen [id number] Yes Inject 20 Units into  the skin 3 (three) times daily before meals.  Patient taking differently: Inject 25 Units into the skin 3 (three) times daily before meals.   [provider]  Active Self, Pharmacy Records  metFORMIN  (GLUCOPHAGE -XR) 500 MG 24 hr tablet [id number] Yes Take 1 tablet (500 mg total) by mouth daily with breakfast. Ricky Fines, MD  Active   Multiple Vitamins-Minerals (MENS 50+ MULTIVITAMIN) TABS [id number] Yes  Take 1 tablet by mouth daily. [provider]  Active Self, Pharmacy Records  nitroGLYCERIN  (NITRODUR - DOSED IN MG/24 HR) 0.2 mg/hr patch [id number] Yes APPLY 1 PATCH ONTO SKIN  DAILY Barbarann Oneil BROCKS, MD  Active Self, Pharmacy Records           Med Note SOILA LYLE BROCKS   Tue Aug 02, 2022 10:51 AM) Used on left foot  potassium chloride  (K-DUR,KLOR-CON ) 10 MEQ tablet [id number] Yes Take 1 tablet (10 mEq total) by mouth daily. Alvan Dorn FALCON, MD  Active Self, Pharmacy Records  sacubitril -valsartan  (ENTRESTO ) 24-26 MG [id number] Yes Take 1 tablet by mouth 2 (two) times daily. Ricky Fines, MD  Active   torsemide  (DEMADEX ) 20 MG tablet [id number] Yes Take 1 tablet (20 mg total) by mouth daily. Alvan Dorn FALCON, MD  Active   TRESIBA FLEXTOUCH 100 UNIT/ML SOPN FlexTouch Pen [id number] Yes Inject 57 Units into the skin at bedtime. [provider]  Active Self, Pharmacy Records            Recommendation:   Continue Current Plan of Care  Follow Up Plan:   Telephone follow-up in 1 week  Fredderick Swanger J. Alp Goldwater RN, MSN [facility], [facility] RN Care Manager Direct Dial: [phone]  Fax: [phone] Website: [email or web address]

## 2024-05-06 ENCOUNTER — Encounter: Payer: Self-pay | Admitting: *Deleted

## 2024-05-09 ENCOUNTER — Other Ambulatory Visit: Payer: Self-pay

## 2024-05-09 NOTE — Patient Instructions (Addendum)
 Visit Information  Thank you for taking time to visit with me today. Please don't hesitate to contact me if I can be of assistance to you before our next scheduled telephone appointment.  Our next appointment is by telephone on 05/16/24 at 1200 pm  Following is a copy of your care plan:   Goals Addressed             This Visit's Progress    VBCI Transitions of Care (TOC) Care Plan       Problems:  Recent Hospitalization for treatment of CHF Knowledge Deficit Related to heart failure management  05/03/24- Patient reports doing good.  Weight 246.4 lbs today.  Reiterated heart failure management.  He verbalized understanding.     Goal:  Over the next 30 days, the patient will not experience hospital readmission  Interventions:  Transitions of Care: Doctor Visits  - discussed the importance of doctor visits Reviewed with patient appointments Dr. Shona 05/29/24 and Almarie Crate 05/17/24  Heart Failure Interventions: Provided education on low sodium diet Reviewed Heart Failure Action Plan in depth and provided written copy Provided education about placing scale on hard, flat surface Advised patient to weigh each morning after emptying bladder Discussed importance of daily weight and advised patient to weigh and record daily Reviewed role of diuretics in prevention of fluid overload and management of heart failure; Discussed the importance of keeping all appointments with provider  Patient Self Care Activities:  Attend all scheduled provider appointments Call provider office for new concerns or questions  Notify RN Care Manager of TOC call rescheduling needs Participate in Transition of Care Program/Attend TOC scheduled calls Take medications as prescribed   call office if I gain more than 2 pounds in one day or 5 pounds in one week keep legs up while sitting track weight in diary use salt in moderation watch for swelling in feet, ankles and legs every day weigh myself  daily follow rescue plan if symptoms flare-up  Plan:  Telephone follow up appointment with care management team member scheduled for:  05/16/24        Patient verbalizes understanding of instructions and care plan provided today and agrees to view in MyChart. Active MyChart status and patient understanding of how to access instructions and care plan via MyChart confirmed with patient.     The patient has been provided with contact information for the care management team and has been advised to call with any health related questions or concerns.   Please call the care guide team at [phone] if you need to cancel or reschedule your appointment.   Please call the Suicide and Crisis Lifeline: 988 call the [place]  National Suicide Prevention Lifeline: 850-660-7806 or TTY: 716-151-7018 TTY 938-376-5570) to talk to a trained counselor if you are experiencing a Mental Health or Behavioral Health Crisis or need someone to talk to.  Iwao Shamblin J. Amore Ackman RN, MSN [facility], [facility] RN Care Manager Direct Dial: [phone]  Fax: [phone] Website: [email or web address]

## 2024-05-09 NOTE — Transitions of Care (Post Inpatient/ED Visit) (Signed)
 Transition of Care week 3  Visit Note  05/09/2024  Name: Albert [name] MRN: [id number]          DOB: November 15, 1960  Situation: Patient enrolled in [facility] 30-day program. Visit completed with patient by telephone.   Background:   Initial Transition Care Management Follow-up Telephone Call    Past Medical History:  Diagnosis Date   Allergic rhinitis    Anemia    Chronic cough    Chronic sinusitis    Complication of anesthesia    couldn't swallow and talk   Coronary artery disease    CVA (cerebral infarction) 2011   R hand deficit   Diabetes mellitus without complication (HCC) ~2000   last HbA1c ~9 .... fasting 160s   GERD (gastroesophageal reflux disease)    Heart murmur    Hypercholesteremia    Hypertension    Myocardial infarction (HCC) 2014   Osteomyelitis (HCC)    first and third metatarsal   Pneumonia    Splenic infarct    setting of lupus anticoagulant    Assessment: Patient Reported Symptoms: Cognitive Cognitive Status: Alert and oriented to person, place, and time, Normal speech and language skills      Neurological Neurological Review of Symptoms: No symptoms reported    HEENT HEENT Symptoms Reported: No symptoms reported      Cardiovascular Cardiovascular Symptoms Reported: No symptoms reported Weight: 246 lb 6.4 oz (111.8 kg) Cardiovascular Self-Management Outcome: 4 (good)  Respiratory Respiratory Symptoms Reported: No symptoms reported    Endocrine Endocrine Symptoms Reported: No symptoms reported Is patient diabetic?: No Is patient checking blood sugars at home?: Yes List most recent blood sugar readings, include date and time of day: Most recent blood sugar 105 this am. A1c 7.3 Reviewed diabetes management Endocrine Self-Management Outcome: 4 (good)  Gastrointestinal Gastrointestinal Symptoms Reported: No symptoms reported      Genitourinary Genitourinary Symptoms Reported: No symptoms reported    Integumentary Integumentary Symptoms Reported:  No symptoms reported    Musculoskeletal Musculoskelatal Symptoms Reviewed: No symptoms reported        Psychosocial Psychosocial Symptoms Reported: No symptoms reported         There were no vitals filed for this visit.  Medications Reviewed Today     Reviewed by Demarus Latterell, RN (Case Manager) on 05/09/24 at 1022  Med List Status: <None>   Medication Order Taking? Sig Documenting Provider Last Dose Status Informant  ACCU-CHEK GUIDE TEST test strip [id number] Yes SMARTSIG:Strip(s) [provider]  Active Self, Pharmacy Records  aspirin  EC 81 MG tablet [id number] Yes Take 1 tablet (81 mg total) by mouth daily. Antoinette Doe, MD  Active Self, Pharmacy Records  atorvastatin  (LIPITOR ) 40 MG tablet [id number] Yes Take 40 mg by mouth daily. [provider]  Active Self, Pharmacy Records  carvedilol  (COREG ) 6.25 MG tablet [id number] Yes Take 1 tablet (6.25 mg total) by mouth 2 (two) times daily with a meal. Ricky Fines, MD  Active   clotrimazole -betamethasone  (LOTRISONE ) cream [id number] Yes Apply 1 application topically 2 (two) times daily. Watt Rush, MD  Active Self, Pharmacy Records  dapagliflozin  propanediol (FARXIGA ) 10 MG TABS tablet [id number] Yes Take 1 tablet (10 mg total) by mouth daily. Ricky Fines, MD  Active   gabapentin (NEURONTIN) 300 MG capsule [id number] Yes Take 300 mg by mouth 3 (three) times daily as needed (pain). Take 1 capsule 3 times a day by oral route as needed. [provider]  Active Self, Pharmacy Records  insulin  lispro (HUMALOG) 100  UNIT/ML KwikPen [id number] Yes Inject 20 Units into the skin 3 (three) times daily before meals.  Patient taking differently: Inject 25 Units into the skin 3 (three) times daily before meals.   [provider]  Active Self, Pharmacy Records  metFORMIN  (GLUCOPHAGE -XR) 500 MG 24 hr tablet [id number] Yes Take 1 tablet (500 mg total) by mouth daily with breakfast. Ricky Fines, MD  Active   Multiple  Vitamins-Minerals (MENS 50+ MULTIVITAMIN) TABS [id number] Yes Take 1 tablet by mouth daily. [provider]  Active Self, Pharmacy Records  nitroGLYCERIN  (NITRODUR - DOSED IN MG/24 HR) 0.2 mg/hr patch [id number] Yes APPLY 1 PATCH ONTO SKIN  DAILY Barbarann Oneil BROCKS, MD  Active Self, Pharmacy Records           Med Note SOILA LYLE BROCKS   Tue Aug 02, 2022 10:51 AM) Used on left foot  potassium chloride  (K-DUR,KLOR-CON ) 10 MEQ tablet [id number] Yes Take 1 tablet (10 mEq total) by mouth daily. Alvan Dorn FALCON, MD  Active Self, Pharmacy Records  sacubitril -valsartan  (ENTRESTO ) 24-26 MG [id number] Yes Take 1 tablet by mouth 2 (two) times daily. Ricky Fines, MD  Active   torsemide  (DEMADEX ) 20 MG tablet [id number] Yes Take 1 tablet (20 mg total) by mouth daily. Alvan Dorn FALCON, MD  Active   TRESIBA FLEXTOUCH 100 UNIT/ML SOPN FlexTouch Pen [id number] Yes Inject 57 Units into the skin at bedtime. [provider]  Active Self, Pharmacy Records            Goals Addressed             This Visit's Progress    VBCI Transitions of Care (TOC) Care Plan       Problems:  Recent Hospitalization for treatment of CHF Knowledge Deficit Related to heart failure management  05/03/24- Patient reports doing good.  Weight 246.4 lbs today.  Reiterated heart failure management.  He verbalized understanding.     Goal:  Over the next 30 days, the patient will not experience hospital readmission  Interventions:  Transitions of Care: Doctor Visits  - discussed the importance of doctor visits Reviewed with patient appointments Dr. Shona 05/29/24 and Almarie Crate 05/17/24  Heart Failure Interventions: Provided education on low sodium diet Reviewed Heart Failure Action Plan in depth and provided written copy Provided education about placing scale on hard, flat surface Advised patient to weigh each morning after emptying bladder Discussed importance of daily weight and advised patient to weigh and  record daily Reviewed role of diuretics in prevention of fluid overload and management of heart failure; Discussed the importance of keeping all appointments with provider  Patient Self Care Activities:  Attend all scheduled provider appointments Call provider office for new concerns or questions  Notify RN Care Manager of TOC call rescheduling needs Participate in Transition of Care Program/Attend TOC scheduled calls Take medications as prescribed   call office if I gain more than 2 pounds in one day or 5 pounds in one week keep legs up while sitting track weight in diary use salt in moderation watch for swelling in feet, ankles and legs every day weigh myself daily follow rescue plan if symptoms flare-up  Plan:  Telephone follow up appointment with care management team member scheduled for:  05/16/24         Recommendation:   Continue Current Plan of Care  Follow Up Plan:   Telephone follow-up in 1 week   Shizue Kaseman J. Master Touchet RN, MSN [place]  [facility], [facility]  RN Care Manager Direct Dial: [phone]  Fax: [phone] Website: [email or web address]

## 2024-05-16 ENCOUNTER — Other Ambulatory Visit: Payer: Self-pay

## 2024-05-16 NOTE — Transitions of Care (Post Inpatient/ED Visit) (Signed)
 Transition of Care week 4  Visit Note  05/16/2024  Name: MARQUETT BERTOLI MRN: [id number]          DOB: Oct 18, 1960  Situation: Patient enrolled in [facility] 30-day program. Visit completed with patient by telephone.   Background:   Initial Transition Care Management Follow-up Telephone Call    Past Medical History:  Diagnosis Date   Allergic rhinitis    Anemia    Chronic cough    Chronic sinusitis    Complication of anesthesia    couldn't swallow and talk   Coronary artery disease    CVA (cerebral infarction) 2011   R hand deficit   Diabetes mellitus without complication (HCC) ~2000   last HbA1c ~9 .... fasting 160s   GERD (gastroesophageal reflux disease)    Heart murmur    Hypercholesteremia    Hypertension    Myocardial infarction (HCC) 2014   Osteomyelitis (HCC)    first and third metatarsal   Pneumonia    Splenic infarct    setting of lupus anticoagulant    Assessment: Patient Reported Symptoms: Cognitive Cognitive Status: Alert and oriented to person, place, and time, Normal speech and language skills      Neurological Neurological Review of Symptoms: No symptoms reported    HEENT HEENT Symptoms Reported: No symptoms reported      Cardiovascular Cardiovascular Symptoms Reported: No symptoms reported Weight: 243 lb 12.8 oz (110.6 kg) Cardiovascular Self-Management Outcome: 4 (good)  Respiratory Respiratory Symptoms Reported: No symptoms reported    Endocrine Endocrine Symptoms Reported: No symptoms reported Is patient diabetic?: Yes Is patient checking blood sugars at home?: Yes List most recent blood sugar readings, include date and time of day: Most recent Blood sugar 113.  Reiterated diabetes management Endocrine Self-Management Outcome: 4 (good)  Gastrointestinal Gastrointestinal Symptoms Reported: No symptoms reported      Genitourinary Genitourinary Symptoms Reported: No symptoms reported    Integumentary Integumentary Symptoms Reported: No  symptoms reported    Musculoskeletal Musculoskelatal Symptoms Reviewed: No symptoms reported        Psychosocial Psychosocial Symptoms Reported: No symptoms reported         There were no vitals filed for this visit.  Medications Reviewed Today     Reviewed by Jozette Castrellon, RN (Case Manager) on 05/16/24 at 1240  Med List Status: <None>   Medication Order Taking? Sig Documenting Provider Last Dose Status Informant  ACCU-CHEK GUIDE TEST test strip [id number] Yes SMARTSIG:Strip(s) [provider]  Active Self, Pharmacy Records  aspirin  EC 81 MG tablet [id number] Yes Take 1 tablet (81 mg total) by mouth daily. Antoinette Doe, MD  Active Self, Pharmacy Records  atorvastatin  (LIPITOR ) 40 MG tablet [id number] Yes Take 40 mg by mouth daily. [provider]  Active Self, Pharmacy Records  carvedilol  (COREG ) 6.25 MG tablet [id number] Yes Take 1 tablet (6.25 mg total) by mouth 2 (two) times daily with a meal. Ricky Fines, MD  Active   clotrimazole -betamethasone  (LOTRISONE ) cream [id number] Yes Apply 1 application topically 2 (two) times daily. Watt Rush, MD  Active Self, Pharmacy Records  dapagliflozin  propanediol (FARXIGA ) 10 MG TABS tablet [id number] Yes Take 1 tablet (10 mg total) by mouth daily. Ricky Fines, MD  Active   gabapentin (NEURONTIN) 300 MG capsule [id number] Yes Take 300 mg by mouth 3 (three) times daily as needed (pain). Take 1 capsule 3 times a day by oral route as needed. [provider]  Active Self, Pharmacy Records  insulin  lispro (HUMALOG) 100 UNIT/ML KwikPen [id number]  Yes Inject 20 Units into the skin 3 (three) times daily before meals.  Patient taking differently: Inject 25 Units into the skin 3 (three) times daily before meals.   [provider]  Active Self, Pharmacy Records  metFORMIN  (GLUCOPHAGE -XR) 500 MG 24 hr tablet [id number] Yes Take 1 tablet (500 mg total) by mouth daily with breakfast. Ricky Fines, MD  Active   Multiple  Vitamins-Minerals (MENS 50+ MULTIVITAMIN) TABS [id number] Yes Take 1 tablet by mouth daily. [provider]  Active Self, Pharmacy Records  nitroGLYCERIN  (NITRODUR - DOSED IN MG/24 HR) 0.2 mg/hr patch [id number] Yes APPLY 1 PATCH ONTO SKIN  DAILY Barbarann Oneil BROCKS, MD  Active Self, Pharmacy Records           Med Note SOILA LYLE BROCKS   Tue Aug 02, 2022 10:51 AM) Used on left foot  potassium chloride  (K-DUR,KLOR-CON ) 10 MEQ tablet [id number] Yes Take 1 tablet (10 mEq total) by mouth daily. Alvan Dorn FALCON, MD  Active Self, Pharmacy Records  sacubitril -valsartan  (ENTRESTO ) 24-26 MG [id number] Yes Take 1 tablet by mouth 2 (two) times daily. Ricky Fines, MD  Active   torsemide  (DEMADEX ) 20 MG tablet [id number] Yes Take 1 tablet (20 mg total) by mouth daily. Alvan Dorn FALCON, MD  Active   TRESIBA FLEXTOUCH 100 UNIT/ML SOPN FlexTouch Pen [id number] Yes Inject 57 Units into the skin at bedtime. [provider]  Active Self, Pharmacy Records            Goals      [facility] Transitions of Care [facility]) Care Plan     Problems:  Recent Hospitalization for treatment of CHF Knowledge Deficit Related to heart failure management  05/16/24- Patient reports doing good.  Weight 243.8 lbs today.  Reviewed heart failure management.  He verbalized understanding.     Goal:  Over the next 30 days, the patient will not experience hospital readmission  Interventions:  Transitions of Care: Doctor Visits  - discussed the importance of doctor visits Reviewed with patient appointments Dr. Shona 05/29/24 and Almarie Crate 05/17/24  Heart Failure Interventions: Provided education on low sodium diet Reviewed Heart Failure Action Plan in depth and provided written copy Provided education about placing scale on hard, flat surface Advised patient to weigh each morning after emptying bladder Discussed importance of daily weight and advised patient to weigh and record daily Reviewed role of diuretics in  prevention of fluid overload and management of heart failure; Discussed the importance of keeping all appointments with provider  Patient Self Care Activities:  Attend all scheduled provider appointments Call provider office for new concerns or questions  Notify RN Care Manager of TOC call rescheduling needs Participate in Transition of Care Program/Attend TOC scheduled calls Take medications as prescribed   call office if I gain more than 2 pounds in one day or 5 pounds in one week keep legs up while sitting track weight in diary use salt in moderation watch for swelling in feet, ankles and legs every day weigh myself daily follow rescue plan if symptoms flare-up  Plan:  Telephone follow up appointment with care management team member scheduled for:  05/22/24         Recommendation:   Continue Current Plan of Care  Follow Up Plan:   Telephone follow-up in 1 week  Shizuko Wojdyla J. Breyson Kelm RN, MSN [facility], [facility] RN Care Manager Direct Dial: [phone]  Fax: [phone] Website: [email or web address]

## 2024-05-16 NOTE — Patient Instructions (Signed)
 Visit Information  Thank you for taking time to visit with me today. Please don't hesitate to contact me if I can be of assistance to you before our next scheduled telephone appointment.  Our next appointment is by telephone on 05/22/24 at 1200 pm  Following is a copy of your care plan:   Goals Addressed             This Visit's Progress    VBCI Transitions of Care (TOC) Care Plan       Problems:  Recent Hospitalization for treatment of CHF Knowledge Deficit Related to heart failure management  05/16/24- Patient reports doing good.  Weight 243.8 lbs today.  Reviewed heart failure management.  He verbalized understanding.     Goal:  Over the next 30 days, the patient will not experience hospital readmission  Interventions:  Transitions of Care: Doctor Visits  - discussed the importance of doctor visits Reviewed with patient appointments Dr. Shona 05/29/24 and Almarie Crate 05/17/24  Heart Failure Interventions: Provided education on low sodium diet Reviewed Heart Failure Action Plan in depth and provided written copy Provided education about placing scale on hard, flat surface Advised patient to weigh each morning after emptying bladder Discussed importance of daily weight and advised patient to weigh and record daily Reviewed role of diuretics in prevention of fluid overload and management of heart failure; Discussed the importance of keeping all appointments with provider  Patient Self Care Activities:  Attend all scheduled provider appointments Call provider office for new concerns or questions  Notify RN Care Manager of TOC call rescheduling needs Participate in Transition of Care Program/Attend TOC scheduled calls Take medications as prescribed   call office if I gain more than 2 pounds in one day or 5 pounds in one week keep legs up while sitting track weight in diary use salt in moderation watch for swelling in feet, ankles and legs every day weigh myself daily follow  rescue plan if symptoms flare-up  Plan:  Telephone follow up appointment with care management team member scheduled for:  05/22/24        Patient verbalizes understanding of instructions and care plan provided today and agrees to view in MyChart. Active MyChart status and patient understanding of how to access instructions and care plan via MyChart confirmed with patient.     The patient has been provided with contact information for the care management team and has been advised to call with any health related questions or concerns.   Please call the care guide team at [phone] if you need to cancel or reschedule your appointment.   Please call the Suicide and Crisis Lifeline: 988 call the [place]  National Suicide Prevention Lifeline: (828) 635-6602 or TTY: 432 519 7715 TTY (225)548-4522) to talk to a trained counselor if you are experiencing a Mental Health or Behavioral Health Crisis or need someone to talk to.  Alaa Mullally J. Aziah Brostrom RN, MSN [facility], [facility] RN Care Manager Direct Dial: [phone]  Fax: [phone] Website: [email or web address]

## 2024-05-17 ENCOUNTER — Ambulatory Visit: Attending: Nurse Practitioner | Admitting: Nurse Practitioner

## 2024-05-17 ENCOUNTER — Encounter: Payer: Self-pay | Admitting: Nurse Practitioner

## 2024-05-17 VITALS — BP 110/60 | HR 62 | Ht 71.0 in | Wt 248.2 lb

## 2024-05-17 DIAGNOSIS — I5020 Unspecified systolic (congestive) heart failure: Secondary | ICD-10-CM

## 2024-05-17 DIAGNOSIS — I10 Essential (primary) hypertension: Secondary | ICD-10-CM

## 2024-05-17 DIAGNOSIS — I2510 Atherosclerotic heart disease of native coronary artery without angina pectoris: Secondary | ICD-10-CM

## 2024-05-17 DIAGNOSIS — I6523 Occlusion and stenosis of bilateral carotid arteries: Secondary | ICD-10-CM | POA: Diagnosis not present

## 2024-05-17 DIAGNOSIS — I739 Peripheral vascular disease, unspecified: Secondary | ICD-10-CM

## 2024-05-17 DIAGNOSIS — I272 Pulmonary hypertension, unspecified: Secondary | ICD-10-CM

## 2024-05-17 DIAGNOSIS — Z8673 Personal history of transient ischemic attack (TIA), and cerebral infarction without residual deficits: Secondary | ICD-10-CM

## 2024-05-17 DIAGNOSIS — E669 Obesity, unspecified: Secondary | ICD-10-CM

## 2024-05-17 DIAGNOSIS — I3139 Other pericardial effusion (noninflammatory): Secondary | ICD-10-CM | POA: Diagnosis not present

## 2024-05-17 DIAGNOSIS — E785 Hyperlipidemia, unspecified: Secondary | ICD-10-CM | POA: Diagnosis not present

## 2024-05-17 NOTE — Progress Notes (Signed)
 Cardiology Office Note   Date:  05/17/2024 ID:  Albert [name], DOB 04/03/1961, MRN [id number] PCP: [name], MD  [facility] Providers Cardiologist:  Alvan Carrier, MD     History of Present Illness Albert [name] is a 63 y.o. male with a PMH of CAD, CHF, hypertension, PVD, s/p right BKA, type 2 diabetes, hyperlipidemia, history of CVA, history of bilateral pleural effusions, and obesity, who presents today for ED follow-up.  ED visit at the end of June 2025 for leg swelling and shortness of breath.  Received IV Lasix , instructed to double p.o. Lasix .  Readmitted in early July 2025 with increased leg swelling and worsening shortness of breath on exertion, was compliant with medications.  BP was found to be soft.  BNP 862.  Troponin 37 > 38.  CXR showed stable cardiomegaly and low lung volumes, mild bibasilar linear atelectasis, small bilateral pleural effusions.  Given IV Lasix .  Echocardiogram EF 30 to 35%, mild LVH, normal RV, moderately elevated PA pressure of 47.7 mmHg, small/moderate pericardial effusion with mild to moderate MR.  Elevated troponin thought to be due to demand ischemia.  Last seen by Ellouise Class, FNP [facility] on May 02, 2024.  He was doing well at the time.  Very compliant with heart failure instructions and medications.  Today he presents for follow-up.  He states he is doing well. He continues to lose weight. Tolerating his medications well. Denies any chest pain, shortness of breath, palpitations, syncope, presyncope, dizziness, orthopnea, PND, swelling or significant weight changes, acute bleeding, or claudication.   ROS: Negative. See HPI.   Studies Reviewed  EKG: EKG is not ordered today.    Carotid duplex 04/2024:  Summary:  Right Carotid: Velocities in the right ICA are consistent with a 1-39%  stenosis. Non-hemodynamically significant plaque <50% noted in the  CCA. The ECA appears <50% stenosed.   Left Carotid: Velocities  in the left ICA are consistent with a 1-39%  stenosis. Non-hemodynamically significant plaque <50% noted in the  CCA. The ECA appears <50% stenosed. Left vertebral artery not  identified, possibly occluded.   Vertebrals:  Right vertebral artery demonstrates antegrade flow. Left  vertebral artery was not visualized.  Subclavians: Normal flow hemodynamics were seen in bilateral subclavian arteries.   Echo 04/2024:  1. Left ventricular ejection fraction, by estimation, is 30 to 35%. The  left ventricle has moderately decreased function. The left ventricle  demonstrates regional wall motion abnormalities (see scoring  diagram/findings for description). There is mild  concentric left ventricular hypertrophy. Left ventricular diastolic  parameters are indeterminate.   2. Right ventricular systolic function is normal. The right ventricular  size is normal. There is moderately elevated pulmonary artery systolic  pressure. The estimated right ventricular systolic pressure is 47.7 mmHg.   3. Small to moderate pericardial effusion. The pericardial effusion is  posterior to the left ventricle. There is no evidence of cardiac  tamponade. Trivial anterior collection as well.   4. The mitral valve is degenerative. Mild to moderate mitral valve  regurgitation.   5. The aortic valve is tricuspid. Aortic valve regurgitation is not  visualized. No aortic stenosis is present.   6. The inferior vena cava is dilated in size with <50% respiratory  variability, suggesting right atrial pressure of 15 mmHg.   Comparison(s): Prior images unable to be directly viewed.   Physical Exam VS:  BP 110/60   Pulse 62   Ht 5' 11 (1.803 m)  Wt 248 lb 3.2 oz (112.6 kg)   SpO2 98%   BMI 34.62 kg/m        Wt Readings from Last 3 Encounters:  05/17/24 248 lb 3.2 oz (112.6 kg)  05/16/24 243 lb 12.8 oz (110.6 kg)  05/09/24 246 lb 6.4 oz (111.8 kg)    GEN: Obese, 63 y.o. male in no acute distress NECK: No JVD;  No carotid bruits CARDIAC: S1/S2, RRR, no murmurs, rubs, gallops RESPIRATORY:  Clear to auscultation without rales, wheezing or rhonchi  ABDOMEN: Soft, non-tender, non-distended EXTREMITIES:  No edema; No deformity   ASSESSMENT AND PLAN  HFrEF, pulmonary HTN Stage C, NYHA class I-II symptoms. EF 30 to 35%, mild LVH, indeterminate diastolic parameters, moderately elevated PASP with estimated right ventricular systolic pressure of 47.7 mmHg. WHO group 2/3 most likely.  Euvolemic and well compensated on exam.  GDMT limited due to BP trends.  Continue carvedilol , Farxiga , Entresto , torsemide  and potassium supplement. Low sodium diet, fluid restriction <2L, and daily weights encouraged. Educated to contact our office for weight gain of 2 lbs overnight or 5 lbs in one week.  Follow-up with heart failure clinic as scheduled.  2. Pericardial effusion Small to moderate pericardial effusion noted on echocardiogram from July 2025 this effusion was noted to be posterior to left ventricle, no evidence of cardiac tamponade.  Recommend updating Echo at next office visit with HF clinic.   3. CAD Stable with no anginal symptoms. No indication for ischemic evaluation.  Continue aspirin , atorvastatin , carvedilol , Entresto , and nitroglycerin  as needed. Heart healthy diet and regular cardiovascular exercise encouraged.   4. HTN BP stable. Discussed to monitor BP at home at least 2 hours after medications and sitting for 5-10 minutes.  Continue current medication regimen. Heart healthy diet and regular cardiovascular exercise encouraged.   5. HLD Most recent LDL 58.  Continue atorvastatin . Heart healthy diet and regular cardiovascular exercise encouraged.   6. PVD, s/p right BKA, carotid artery stenosis Denies any issues.  Continue current medication regimen.  Most recent carotid duplex from July 2025 showed mild 1 to 39% ICA stenosis.  Will continue to monitor.   7. Hx of CVA Has chronic right weakness along  upper extremity from past stroke. Continue current medication regimen. Continue to follow with PCP.  8. Obesity   Weight loss via diet and exercise encouraged. Discussed the impact being overweight would have on cardiovascular risk.   Dispo: Follow-up with MD/APP in 3 to 4 months or sooner if any changes.  Signed, Almarie Crate, NP

## 2024-05-17 NOTE — Patient Instructions (Signed)
 Medication Instructions:  Your physician recommends that you continue on your current medications as directed. Please refer to the Current Medication list given to you today.  Labwork: None   Testing/Procedures: None   Follow-Up: Your physician recommends that you schedule a follow-up appointment in: 3-4 months   Any Other Special Instructions Will Be Listed Below (If Applicable).  If you need a refill on your cardiac medications before your next appointment, please call your pharmacy.

## 2024-05-22 ENCOUNTER — Other Ambulatory Visit: Payer: Self-pay

## 2024-05-22 VITALS — Wt 243.0 lb

## 2024-05-22 DIAGNOSIS — I5042 Chronic combined systolic (congestive) and diastolic (congestive) heart failure: Secondary | ICD-10-CM

## 2024-05-22 NOTE — Transitions of Care (Post Inpatient/ED Visit) (Signed)
 Transition of Care Week 5  Visit Note  05/22/2024  Name: Albert [name] MRN: [id number]          DOB: 1961-01-14  Situation: Patient enrolled in [facility] 30-day program. Visit completed with patient by telephone.   Background:   Initial Transition Care Management Follow-up Telephone Call    Past Medical History:  Diagnosis Date   Allergic rhinitis    Anemia    Chronic cough    Chronic sinusitis    Complication of anesthesia    couldn't swallow and talk   Coronary artery disease    CVA (cerebral infarction) 2011   R hand deficit   Diabetes mellitus without complication (HCC) ~2000   last HbA1c ~9 .... fasting 160s   GERD (gastroesophageal reflux disease)    Heart murmur    Hypercholesteremia    Hypertension    Myocardial infarction (HCC) 2014   Osteomyelitis (HCC)    first and third metatarsal   Pneumonia    Splenic infarct    setting of lupus anticoagulant    Assessment: Patient Reported Symptoms: Cognitive Cognitive Status: Alert and oriented to person, place, and time, Normal speech and language skills      Neurological Neurological Review of Symptoms: No symptoms reported    HEENT HEENT Symptoms Reported: No symptoms reported      Cardiovascular Cardiovascular Symptoms Reported: No symptoms reported Weight: 243 lb (110.2 kg) Cardiovascular Self-Management Outcome: 4 (good)  Respiratory Respiratory Symptoms Reported: No symptoms reported    Endocrine Endocrine Symptoms Reported: No symptoms reported Is patient diabetic?: Yes Is patient checking blood sugars at home?: Yes List most recent blood sugar readings, include date and time of day: Most recent blood sugar 112 Endocrine Self-Management Outcome: 4 (good)  Gastrointestinal Gastrointestinal Symptoms Reported: No symptoms reported      Genitourinary Genitourinary Symptoms Reported: No symptoms reported    Integumentary Integumentary Symptoms Reported: No symptoms reported    Musculoskeletal  Musculoskelatal Symptoms Reviewed: No symptoms reported        Psychosocial Psychosocial Symptoms Reported: No symptoms reported         There were no vitals filed for this visit.  Medications Reviewed Today     Reviewed by Lavelle Akel, RN (Case Manager) on 05/22/24 at 1226  Med List Status: <None>   Medication Order Taking? Sig Documenting Provider Last Dose Status Informant  ACCU-CHEK GUIDE TEST test strip [id number] Yes SMARTSIG:Strip(s) [provider]  Active Self, Pharmacy Records  aspirin  EC 81 MG tablet [id number] Yes Take 1 tablet (81 mg total) by mouth daily. Antoinette Doe, MD  Active Self, Pharmacy Records  atorvastatin  (LIPITOR ) 40 MG tablet [id number] Yes Take 40 mg by mouth daily. [provider]  Active Self, Pharmacy Records  carvedilol  (COREG ) 6.25 MG tablet [id number] Yes Take 1 tablet (6.25 mg total) by mouth 2 (two) times daily with a meal. Ricky Fines, MD  Active   clotrimazole -betamethasone  (LOTRISONE ) cream [id number] Yes Apply 1 application topically 2 (two) times daily. Watt Rush, MD  Active Self, Pharmacy Records  dapagliflozin  propanediol (FARXIGA ) 10 MG TABS tablet [id number] Yes Take 1 tablet (10 mg total) by mouth daily. Ricky Fines, MD  Active   gabapentin (NEURONTIN) 300 MG capsule [id number] Yes Take 300 mg by mouth 3 (three) times daily as needed (pain). Take 1 capsule 3 times a day by oral route as needed. [provider]  Active Self, Pharmacy Records  insulin  lispro (HUMALOG) 100 UNIT/ML KwikPen [id number] Yes Inject 20 Units into the  skin 3 (three) times daily before meals. [provider]  Active Self, Pharmacy Records  metFORMIN  (GLUCOPHAGE -XR) 500 MG 24 hr tablet [id number] Yes Take 1 tablet (500 mg total) by mouth daily with breakfast. Ricky Fines, MD  Active   Multiple Vitamins-Minerals (MENS 50+ MULTIVITAMIN) TABS [id number] Yes Take 1 tablet by mouth daily. [provider]  Active Self, Pharmacy  Records  nitroGLYCERIN  (NITRODUR - DOSED IN MG/24 HR) 0.2 mg/hr patch [id number] Yes APPLY 1 PATCH ONTO SKIN  DAILY Barbarann Oneil BROCKS, MD  Active Self, Pharmacy Records           Med Note SOILA LYLE BROCKS   Tue Aug 02, 2022 10:51 AM) Used on left foot  potassium chloride  (K-DUR,KLOR-CON ) 10 MEQ tablet [id number] Yes Take 1 tablet (10 mEq total) by mouth daily. Alvan Dorn FALCON, MD  Active Self, Pharmacy Records  sacubitril -valsartan  (ENTRESTO ) 24-26 MG [id number] Yes Take 1 tablet by mouth 2 (two) times daily. Ricky Fines, MD  Active   torsemide  (DEMADEX ) 20 MG tablet [id number] Yes Take 1 tablet (20 mg total) by mouth daily. Alvan Dorn FALCON, MD  Active   TRESIBA FLEXTOUCH 100 UNIT/ML SOPN FlexTouch Pen [id number] Yes Inject 57 Units into the skin at bedtime. [provider]  Active Self, Pharmacy Records            Goals Addressed             This Visit's Progress    COMPLETED: VBCI Transitions of Care (TOC) Care Plan       Problems:  Recent Hospitalization for treatment of CHF Knowledge Deficit Related to heart failure management  05/22/24 - Patient reports doing good.  Weight 243 lbs today.  Reinforced heart failure management and transfer to CCM program for additional support.  He verbalized understanding.     Goal:  Over the next 30 days, the patient will not experience hospital readmission  Interventions:  Transitions of Care: Doctor Visits  - discussed the importance of doctor visits Reviewed with patient appointments Dr. Shona 05/29/24   Heart Failure Interventions: Provided education on low sodium diet Provided education about placing scale on hard, flat surface Advised patient to weigh each morning after emptying bladder Discussed importance of daily weight and advised patient to weigh and record daily Discussed the importance of keeping all appointments with provider  Patient Self Care Activities:  Attend all scheduled provider appointments Call provider  office for new concerns or questions  Notify RN Care Manager of TOC call rescheduling needs Participate in Transition of Care Program/Attend TOC scheduled calls Take medications as prescribed   call office if I gain more than 2 pounds in one day or 5 pounds in one week keep legs up while sitting track weight in diary use salt in moderation watch for swelling in feet, ankles and legs every day weigh myself daily follow rescue plan if symptoms flare-up  Plan:  Refer patient to CCM         Recommendation:   Refer to longitudinal program-CCM nurse  Follow Up Plan:   Referral to RN Case Manager Closing From:  Transitions of Care Program  Patsey Pitstick DOROTHA Seeds RN, MSN [place]  [facility], [facility] RN Care Manager Direct Dial: [phone]  Fax: [phone] Website: [email or web address]

## 2024-05-22 NOTE — Patient Instructions (Signed)
 Visit Information  Thank you for taking time to visit with me today. Please don't hesitate to contact me if I can be of assistance to you    Following is a copy of your care plan:   Goals Addressed             This Visit's Progress    COMPLETED: VBCI Transitions of Care (TOC) Care Plan       Problems:  Recent Hospitalization for treatment of CHF Knowledge Deficit Related to heart failure management  05/22/24 - Patient reports doing good.  Weight 243 lbs today.  Reinforced heart failure management and transfer to CCM program for additional support.  He verbalized understanding.     Goal:  Over the next 30 days, the patient will not experience hospital readmission  Interventions:  Transitions of Care: Doctor Visits  - discussed the importance of doctor visits Reviewed with patient appointments Dr. Shona 05/29/24   Heart Failure Interventions: Provided education on low sodium diet Provided education about placing scale on hard, flat surface Advised patient to weigh each morning after emptying bladder Discussed importance of daily weight and advised patient to weigh and record daily Discussed the importance of keeping all appointments with provider  Patient Self Care Activities:  Attend all scheduled provider appointments Call provider office for new concerns or questions  Notify RN Care Manager of TOC call rescheduling needs Participate in Transition of Care Program/Attend TOC scheduled calls Take medications as prescribed   call office if I gain more than 2 pounds in one day or 5 pounds in one week keep legs up while sitting track weight in diary use salt in moderation watch for swelling in feet, ankles and legs every day weigh myself daily follow rescue plan if symptoms flare-up  Plan:  Refer patient to CCM        Patient verbalizes understanding of instructions and care plan provided today and agrees to view in MyChart. Active MyChart status and patient  understanding of how to access instructions and care plan via MyChart confirmed with patient.     The patient has been provided with contact information for the care management team and has been advised to call with any health related questions or concerns.  Patient being referred to [facility] nurse for continued support  Please call the care guide team at [phone] if you need to cancel or reschedule your appointment.   Please call the Suicide and Crisis Lifeline: 988 if you are experiencing a Mental Health or Behavioral Health Crisis or need someone to talk to.  Reverie Vaquera J. Jniyah Dantuono RN, MSN [facility], [facility] RN Care Manager Direct Dial: [phone]  Fax: [phone] Website: [email or web address]

## 2024-05-24 ENCOUNTER — Telehealth: Payer: Self-pay

## 2024-05-24 NOTE — Progress Notes (Signed)
 Complex Care Management Note Care Guide Note  05/24/2024 Name: Albert [name] MRN: [id number] DOB: 04-29-1961   Complex Care Management Outreach Attempts: An unsuccessful telephone outreach was attempted today to offer the patient information about available complex care management services.  Follow Up Plan:  Additional outreach attempts will be made to offer the patient complex care management information and services.   Encounter Outcome:  No Answer  Leotis Rase [facility], [facility] Guide  Direct Dial: [phone]  Fax [phone]

## 2024-05-24 NOTE — Progress Notes (Signed)
 Complex Care Management Note  Care Guide Note 05/24/2024 Name: Albert [name] MRN: [id number] DOB: June 29, 1961  [name] is a 63 y.o. year old male who sees Shona, Norleen PEDLAR, MD for primary care. I reached out to [name] by phone today to offer complex care management services.  [name] was given information about Complex Care Management services today including:   The Complex Care Management services include support from the care team which includes your Nurse Care Manager, Clinical Social Worker, or Pharmacist.  The Complex Care Management team is here to help remove barriers to the health concerns and goals most important to you. Complex Care Management services are voluntary, and the patient may decline or stop services at any time by request to their care team member.   Complex Care Management Consent Status: Patient agreed to services and verbal consent obtained.   Follow up plan:  Telephone appointment with complex care management team member scheduled for:  06/03/24 @ 10 AM  Encounter Outcome:  Patient Scheduled   Leotis Rase [facility], [facility] Guide  Direct Dial: [phone]  Fax [phone]

## 2024-05-29 DIAGNOSIS — Z9189 Other specified personal risk factors, not elsewhere classified: Secondary | ICD-10-CM | POA: Diagnosis not present

## 2024-05-29 DIAGNOSIS — D649 Anemia, unspecified: Secondary | ICD-10-CM | POA: Diagnosis not present

## 2024-05-29 DIAGNOSIS — Z89511 Acquired absence of right leg below knee: Secondary | ICD-10-CM | POA: Diagnosis not present

## 2024-05-29 DIAGNOSIS — J90 Pleural effusion, not elsewhere classified: Secondary | ICD-10-CM | POA: Diagnosis not present

## 2024-05-29 DIAGNOSIS — N182 Chronic kidney disease, stage 2 (mild): Secondary | ICD-10-CM | POA: Diagnosis not present

## 2024-05-29 DIAGNOSIS — I5042 Chronic combined systolic (congestive) and diastolic (congestive) heart failure: Secondary | ICD-10-CM | POA: Diagnosis not present

## 2024-05-29 DIAGNOSIS — R945 Abnormal results of liver function studies: Secondary | ICD-10-CM | POA: Diagnosis not present

## 2024-05-29 DIAGNOSIS — I2510 Atherosclerotic heart disease of native coronary artery without angina pectoris: Secondary | ICD-10-CM | POA: Diagnosis not present

## 2024-05-29 DIAGNOSIS — I10 Essential (primary) hypertension: Secondary | ICD-10-CM | POA: Diagnosis not present

## 2024-05-29 DIAGNOSIS — Z0000 Encounter for general adult medical examination without abnormal findings: Secondary | ICD-10-CM | POA: Diagnosis not present

## 2024-06-03 ENCOUNTER — Other Ambulatory Visit: Payer: Self-pay

## 2024-06-03 ENCOUNTER — Encounter: Payer: Self-pay | Admitting: *Deleted

## 2024-06-03 ENCOUNTER — Other Ambulatory Visit: Payer: Self-pay | Admitting: *Deleted

## 2024-06-03 NOTE — Patient Outreach (Signed)
 Complex Care Management   Visit Note  07/01/2024 updated note info for 06/03/24  Name:  Albert [name] MRN: [id number] DOB: [birth year]  Situation: Referral received for Complex Care Management related to Diabetes with Complications I obtained verbal consent from Patient.  Visit completed with patent  on the phone   congestive Heart Failure (CHF) and Diabetes (DM) type 2   Following his diabetic low sodium fluid restriction diet  congestive Heart Failure (CHF) weighing daily - 240 lbs today Consistency with diuretic Discussed the importance of hydration He is reading food labels and monitoring his foods   July 2025 eye exam completed reading glasses recommended  No worsening symptoms reported   Background:   Past Medical History:  Diagnosis Date   Allergic rhinitis    Anemia    Chronic cough    Chronic sinusitis    Complication of anesthesia    couldn't swallow and talk   Coronary artery disease    CVA (cerebral infarction) 2011   R hand deficit   Diabetes mellitus without complication (HCC) ~2000   last HbA1c ~9 .... fasting 160s   GERD (gastroesophageal reflux disease)    Heart murmur    Hypercholesteremia    Hypertension    Myocardial infarction (HCC) 2014   Osteomyelitis (HCC)    first and third metatarsal   Pneumonia    Splenic infarct    setting of lupus anticoagulant    Assessment: Patient Reported Symptoms:  Cognitive Cognitive Status: Alert and oriented to person, place, and time, Insightful and able to interpret abstract concepts, Normal speech and language skills Cognitive/Intellectual Conditions Management [RPT]: None reported or documented in medical history or problem list   Health Maintenance Behaviors: Healthy diet, Sleep adequate, Annual physical exam Healing Pattern: Average Health Facilitated by: Healthy diet, Rest  Neurological Neurological Review of Symptoms: No symptoms reported Neurological Management Strategies: Diet modification,  Adequate rest, Routine screening Neurological Self-Management Outcome: 4 (good)  HEENT HEENT Symptoms Reported: No symptoms reported HEENT Management Strategies: Routine screening HEENT Self-Management Outcome: 4 (good)    Cardiovascular   Cardiovascular Management Strategies: Fluid modification, Medication therapy, Routine screening, Diet modification Weight: 240 lb (108.9 kg) Cardiovascular Self-Management Outcome: 4 (good) Cardiovascular Comment: <2000 ml sodium  Respiratory Respiratory Symptoms Reported: No symptoms reported Respiratory Management Strategies: Medication therapy, Routine screening, Adequate rest Respiratory Self-Management Outcome: 4 (good)  Endocrine Endocrine Symptoms Reported: No symptoms reported Is patient diabetic?: Yes Is patient checking blood sugars at home?: Yes List most recent blood sugar readings, include date and time of day: cbg 110 Endocrine Self-Management Outcome: 4 (good)  Gastrointestinal Gastrointestinal Symptoms Reported: No symptoms reported Gastrointestinal Management Strategies: Diet modification, Fluid modification Gastrointestinal Self-Management Outcome: 4 (good)    Genitourinary Genitourinary Symptoms Reported: No symptoms reported Genitourinary Management Strategies: Diet modification, Fluid modification Genitourinary Self-Management Outcome: 4 (good)  Integumentary Integumentary Symptoms Reported: No symptoms reported Additional Integumentary Details: healed R AKA    Musculoskeletal Musculoskelatal Symptoms Reviewed: Unsteady gait Musculoskeletal Management Strategies: Fluid modification, Diet modification      Psychosocial       Quality of Family Relationships: helpful, involved, supportive Do you feel physically threatened by others?: No      07/01/2024   10:42 AM  Depression screen PHQ 2/9  Decreased Interest 0  Down, Depressed, Hopeless 0  PHQ - 2 Score 0    There were no vitals filed for this visit.  Medications  Reviewed Today   Medications were not reviewed in this encounter  Recommendation:   PCP Follow-up Continue Current Plan of Care   Follow Up Plan:   Telephone follow up appointment date/time:  07/01/24 1000  Hy Swiatek L. Ramonita, RN, BSN, CCM [place]  [facility], [facility] RN Care Manager Direct Dial: [phone]  Fax: [phone]

## 2024-06-03 NOTE — Patient Instructions (Signed)
 Visit Information  Thank you for taking time to visit with me today. Please don't hesitate to contact me if I can be of assistance to you before our next scheduled appointment.  Our next appointment is by telephone on 07/01/24 at 1000 Please call the care guide team at [phone] if you need to cancel or reschedule your appointment.   Following is a copy of your care plan:   Goals Addressed             This Visit's Progress    VBCI RN Care Plan       Problems:  Chronic Disease Management support and education needs related to DMII  Goal: Over the next 3 months the Patient will continue to work with RN Care Manager and/or Social Worker to address care management and care coordination needs related to DMII as evidenced by adherence to care management team scheduled appointments     Over the next 4 weeks patient will try to eat fatty fish (salmon, tuna, herring, mackerel, sardines) once a week  07/01/24 Update- Patient maintains a diabetic diet, now incorporating fatty fish options cbgs < 180   Interventions:   Diabetes Interventions: Assessed patient's understanding of A1c goal: <7% Discussed plans with patient for ongoing care management follow up and provided patient with direct contact information for care management team Screening for signs and symptoms of depression related to chronic disease state  Assessed social determinant of health barriers Encouragement provided for maintenance of good cbg values Assessed for any worsening symptoms, Encuraged hydration Lab Results  Component Value Date   HGBA1C 7.3 (H) 04/20/2024    Patient Self-Care Activities:  Attend all scheduled provider appointments Attend church or other social activities Call pharmacy for medication refills 3-7 days in advance of running out of medications Call provider office for new concerns or questions  Take medications as prescribed   drink 6 to 8 glasses of water  each day eat fish at least once per  week manage portion size  Plan:  Telephone follow up appointment with care management team member scheduled for:  pending schedule of new RN CM         Please call the Suicide and Crisis Lifeline: 988 call the [place]  National Suicide Prevention Lifeline: 515-003-0393 or TTY: (903)782-9244 TTY 408-488-4663) to talk to a trained counselor call 1-800-273-TALK (toll free, 24 hour hotline) call the [facility]: (516)076-3761 call 911 if you are experiencing a Mental Health or Behavioral Health Crisis or need someone to talk to.  Patient verbalizes understanding of instructions and care plan provided today and agrees to view in MyChart. Active MyChart status and patient understanding of how to access instructions and care plan via MyChart confirmed with patient.     Ajai Terhaar L. Ramonita, RN, BSN, CCM [place]  [facility], [facility] Health RN Care Manager Direct Dial: [phone]  Fax: [phone]

## 2024-06-06 ENCOUNTER — Ambulatory Visit (HOSPITAL_COMMUNITY)
Admission: RE | Admit: 2024-06-06 | Discharge: 2024-06-06 | Disposition: A | Discharge status: Home / Self Care | Source: Ambulatory Visit | Attending: Internal Medicine | Admitting: Internal Medicine

## 2024-06-06 DIAGNOSIS — J90 Pleural effusion, not elsewhere classified: Secondary | ICD-10-CM | POA: Insufficient documentation

## 2024-06-06 DIAGNOSIS — R918 Other nonspecific abnormal finding of lung field: Secondary | ICD-10-CM | POA: Diagnosis not present

## 2024-06-06 DIAGNOSIS — R9389 Abnormal findings on diagnostic imaging of other specified body structures: Secondary | ICD-10-CM | POA: Diagnosis not present

## 2024-06-06 DIAGNOSIS — R0989 Other specified symptoms and signs involving the circulatory and respiratory systems: Secondary | ICD-10-CM | POA: Diagnosis not present

## 2024-06-13 ENCOUNTER — Encounter: Admitting: Family

## 2024-07-01 ENCOUNTER — Other Ambulatory Visit: Payer: Self-pay

## 2024-07-01 ENCOUNTER — Other Ambulatory Visit: Payer: Self-pay | Admitting: *Deleted

## 2024-07-01 NOTE — Patient Outreach (Addendum)
 Complex Care Management   Visit Note  07/01/2024  Name:  Albert [name] MRN: [id number] DOB: Aug 31, 1961  Situation: Referral received for Complex Care Management related to Heart Failure I obtained verbal consent from Patient.  Visit completed with Patient  on the phone   congestive Heart Failure (CHF) weighing daily Weight averaging 238-240 lbs Follow his low sodium fluid restriction  Consistingly taking Torsemide  without issues  July 2025 eye exam reading glasses  Maintaining hydration   Diabetes He is reading food labels and monitoring his foods  To maintain his diabetic diet   Albert [name] denies any other worsening symptoms and maintains that he is doing very well  He discusses his upcoming flu vaccine and 09/03/2024 1035 pcp office visit  He voices understanding of an outreach from a new RN CM in 07/2024 and will outreach to this RN CM if concerns prior to this    Background:   Past Medical History:  Diagnosis Date   Allergic rhinitis    Anemia    Chronic cough    Chronic sinusitis    Complication of anesthesia    couldn't swallow and talk   Coronary artery disease    CVA (cerebral infarction) 2011   R hand deficit   Diabetes mellitus without complication (HCC) ~2000   last HbA1c ~9 .... fasting 160s   GERD (gastroesophageal reflux disease)    Heart murmur    Hypercholesteremia    Hypertension    Myocardial infarction (HCC) 2014   Osteomyelitis (HCC)    first and third metatarsal   Pneumonia    Splenic infarct    setting of lupus anticoagulant    Assessment: Patient Reported Symptoms:  Cognitive Cognitive Status: No symptoms reported   Health Maintenance Behaviors: Annual physical exam, Healthy diet, Immunizations, Sleep adequate Healing Pattern: Average Health Facilitated by: Healthy diet, Pain control, Prayer/meditation, Rest, Stress management  Neurological Neurological Review of Symptoms: No symptoms reported Neurological Management Strategies:  Routine screening Neurological Self-Management Outcome: 4 (good)  HEENT HEENT Symptoms Reported: No symptoms reported HEENT Management Strategies: Routine screening HEENT Self-Management Outcome: 4 (good)    Cardiovascular Cardiovascular Symptoms Reported: No symptoms reported Cardiovascular Management Strategies: Routine screening, Medication therapy Weight: 240 lb (108.9 kg) Cardiovascular Self-Management Outcome: 4 (good)  Respiratory Respiratory Symptoms Reported: No symptoms reported    Endocrine Endocrine Symptoms Reported: No symptoms reported Is patient diabetic?: Yes Is patient checking blood sugars at home?: Yes List most recent blood sugar readings, include date and time of day: <180 cbgs Endocrine Self-Management Outcome: 4 (good)  Gastrointestinal Gastrointestinal Symptoms Reported: No symptoms reported Gastrointestinal Management Strategies: Medication therapy Gastrointestinal Self-Management Outcome: 4 (good)    Genitourinary Genitourinary Symptoms Reported: No symptoms reported Genitourinary Self-Management Outcome: 4 (good)  Integumentary Integumentary Symptoms Reported: No symptoms reported Skin Management Strategies: Routine screening Skin Self-Management Outcome: 4 (good)  Musculoskeletal Musculoskelatal Symptoms Reviewed: No symptoms reported Musculoskeletal Management Strategies: Routine screening Musculoskeletal Self-Management Outcome: 4 (good)      Psychosocial Psychosocial Symptoms Reported: No symptoms reported Behavioral Management Strategies: Support system, Adequate rest Behavioral Health Self-Management Outcome: 4 (good) Major Change/Loss/Stressor/Fears (CP): Medical condition, self Techniques to Cope with Loss/Stress/Change: Diversional activities Quality of Family Relationships: helpful, involved, supportive Do you feel physically threatened by others?: No    07/01/2024    PHQ2-9 Depression Screening   Little interest or pleasure in doing  things Not at all  Feeling down, depressed, or hopeless Not at all  PHQ-2 - Total Score 0  Trouble falling  or staying asleep, or sleeping too much    Feeling tired or having little energy    Poor appetite or overeating     Feeling bad about yourself - or that you are a failure or have let yourself or your family down    Trouble concentrating on things, such as reading the newspaper or watching television    Moving or speaking so slowly that other people could have noticed.  Or the opposite - being so fidgety or restless that you have been moving around a lot more than usual    Thoughts that you would be better off dead, or hurting yourself in some way    PHQ2-9 Total Score    If you checked off any problems, how difficult have these problems made it for you to do your work, take care of things at home, or get along with other people    Depression Interventions/Treatment      There were no vitals filed for this visit.  Medications Reviewed Today   Medications were not reviewed in this encounter     Recommendation:   09/03/24 1035 PCP Follow-up  Follow Up Plan:   Telephone follow up appointment date/time:   tentatively for 09/09/24 1000- Albert  [name] agrees to a follow up with pending new RN CM   Suzen L. Ramonita, RN, BSN, CCM [place]  [facility], [facility] RN Care Manager Direct Dial: [phone]  Fax: [phone]

## 2024-07-01 NOTE — Patient Instructions (Signed)
 Visit Information  Thank you for taking time to visit with me today. Please don't hesitate to contact me if I can be of assistance to you before our next scheduled appointment.  Your next tentative care management appointment is by telephone on 09/09/24 at 1000  Patient Self-Care Activities:  Attend all scheduled provider appointments Call pharmacy for medication refills 3-7 days in advance of running out of medications Perform all self care activities independently  Take medications as prescribed   call office if I gain more than 2 pounds in one day or 5 pounds in one week use salt in moderation watch for swelling in feet, ankles and legs every day weigh myself daily   Plan:  The care management team will reach out to the patient again over the next 60 days. The patient has been provided with contact information for the care management team and has been advised to call with any health related questions or concerns.     Please call the care guide team at [phone] if you need to cancel, schedule, or reschedule an appointment.   Please call the Suicide and Crisis Lifeline: 988 call the [place]  National Suicide Prevention Lifeline: 409 405 3624 or TTY: 402-479-8289 TTY 938-250-9326) to talk to a trained counselor call 1-800-273-TALK (toll free, 24 hour hotline) call the [facility]: (514) 526-3090 call 911 if you are experiencing a Mental Health or Behavioral Health Crisis or need someone to talk to.  Dayln Tugwell L. Ramonita, RN, BSN, CCM [place]  [facility], [facility] Health RN Care Manager Direct Dial: [phone]  Fax: [phone]

## 2024-07-16 ENCOUNTER — Telehealth: Payer: Self-pay | Admitting: Cardiology

## 2024-07-16 MED ORDER — DAPAGLIFLOZIN PROPANEDIOL 10 MG PO TABS: 10.0000 mg | ORAL_TABLET | Freq: Every day | ORAL | 6 refills | Status: DC

## 2024-07-16 MED ORDER — SACUBITRIL-VALSARTAN 24-26 MG PO TABS: 1.0000 | ORAL_TABLET | Freq: Two times a day (BID) | ORAL | 2 refills | Status: DC

## 2024-07-16 NOTE — Telephone Encounter (Signed)
 Pt of Dr. Alvan. These RX's have not been prescribed or refilled by Cardiology. Are these something that Dr. Alvan wants to refill? Please advise.

## 2024-07-16 NOTE — Telephone Encounter (Signed)
 refilled

## 2024-07-16 NOTE — Telephone Encounter (Signed)
*  STAT* If patient is at the pharmacy, call can be transferred to refill team.   1. Which medications need to be refilled? (please list name of each medication and dose if known) dapagliflozin  propanediol (FARXIGA ) 10 MG TABS tablet   sacubitril -valsartan  (ENTRESTO ) 24-26 MG   2. Which pharmacy/location (including street and city if local pharmacy) is medication to be sent to? [id number] - [place], [place] - [address] Phone: [phone]  Fax: [phone]      3. Do they need a 30 day or 90 day supply? 30

## 2024-08-26 ENCOUNTER — Ambulatory Visit: Attending: Nurse Practitioner | Admitting: Nurse Practitioner

## 2024-08-26 ENCOUNTER — Encounter: Payer: Self-pay | Admitting: Nurse Practitioner

## 2024-08-26 VITALS — BP 106/62 | HR 64 | Ht 71.0 in | Wt 238.6 lb

## 2024-08-26 DIAGNOSIS — E669 Obesity, unspecified: Secondary | ICD-10-CM

## 2024-08-26 DIAGNOSIS — I2510 Atherosclerotic heart disease of native coronary artery without angina pectoris: Secondary | ICD-10-CM | POA: Diagnosis not present

## 2024-08-26 DIAGNOSIS — I6523 Occlusion and stenosis of bilateral carotid arteries: Secondary | ICD-10-CM | POA: Diagnosis not present

## 2024-08-26 DIAGNOSIS — I272 Pulmonary hypertension, unspecified: Secondary | ICD-10-CM | POA: Diagnosis not present

## 2024-08-26 DIAGNOSIS — Z8673 Personal history of transient ischemic attack (TIA), and cerebral infarction without residual deficits: Secondary | ICD-10-CM | POA: Diagnosis not present

## 2024-08-26 DIAGNOSIS — I3139 Other pericardial effusion (noninflammatory): Secondary | ICD-10-CM

## 2024-08-26 DIAGNOSIS — I10 Essential (primary) hypertension: Secondary | ICD-10-CM

## 2024-08-26 DIAGNOSIS — E785 Hyperlipidemia, unspecified: Secondary | ICD-10-CM | POA: Diagnosis not present

## 2024-08-26 DIAGNOSIS — I5020 Unspecified systolic (congestive) heart failure: Secondary | ICD-10-CM

## 2024-08-26 DIAGNOSIS — I739 Peripheral vascular disease, unspecified: Secondary | ICD-10-CM | POA: Diagnosis not present

## 2024-08-26 MED ORDER — TORSEMIDE 20 MG PO TABS: 20.0000 mg | ORAL_TABLET | Freq: Every day | ORAL | 3 refills | Status: AC

## 2024-08-26 NOTE — Progress Notes (Unsigned)
 Cardiology Office Note   Date:  05/17/2024 ID:  Albert [name], DOB 07-09-1961, MRN [id number] PCP: [name], MD  [facility] Providers Cardiologist:  Alvan Carrier, MD     History of Present Illness Albert [name] is a 63 y.o. male with a PMH of CAD, CHF, hypertension, PVD, s/p right BKA, type 2 diabetes, hyperlipidemia, history of CVA, history of bilateral pleural effusions, and obesity, who presents today for ED follow-up.  ED visit at the end of June 2025 for leg swelling and shortness of breath.  Received IV Lasix , instructed to double p.o. Lasix .  Readmitted in early July 2025 with increased leg swelling and worsening shortness of breath on exertion, was compliant with medications.  BP was found to be soft.  BNP 862.  Troponin 37 > 38.  CXR showed stable cardiomegaly and low lung volumes, mild bibasilar linear atelectasis, small bilateral pleural effusions.  Given IV Lasix .  Echocardiogram EF 30 to 35%, mild LVH, normal RV, moderately elevated PA pressure of 47.7 mmHg, small/moderate pericardial effusion with mild to moderate MR.  Elevated troponin thought to be due to demand ischemia.  Last seen by Ellouise Class, FNP at [facility] on May 02, 2024.  He was doing well at the time.  Very compliant with heart failure instructions and medications.  Today he presents for follow-up.  He states he is doing well. He continues to lose weight. Tolerating his medications well. Denies any chest pain, shortness of breath, palpitations, syncope, presyncope, dizziness, orthopnea, PND, swelling or significant weight changes, acute bleeding, or claudication.   ROS: Negative. See HPI.   Studies Reviewed  EKG: EKG is not ordered today.    Carotid duplex 04/2024:  Summary:  Right Carotid: Velocities in the right ICA are consistent with a 1-39%  stenosis. Non-hemodynamically significant plaque <50% noted in the  CCA. The ECA appears <50% stenosed.   Left Carotid: Velocities  in the left ICA are consistent with a 1-39%  stenosis. Non-hemodynamically significant plaque <50% noted in the  CCA. The ECA appears <50% stenosed. Left vertebral artery not  identified, possibly occluded.   Vertebrals:  Right vertebral artery demonstrates antegrade flow. Left  vertebral artery was not visualized.  Subclavians: Normal flow hemodynamics were seen in bilateral subclavian arteries.   Echo 04/2024:  1. Left ventricular ejection fraction, by estimation, is 30 to 35%. The  left ventricle has moderately decreased function. The left ventricle  demonstrates regional wall motion abnormalities (see scoring  diagram/findings for description). There is mild  concentric left ventricular hypertrophy. Left ventricular diastolic  parameters are indeterminate.   2. Right ventricular systolic function is normal. The right ventricular  size is normal. There is moderately elevated pulmonary artery systolic  pressure. The estimated right ventricular systolic pressure is 47.7 mmHg.   3. Small to moderate pericardial effusion. The pericardial effusion is  posterior to the left ventricle. There is no evidence of cardiac  tamponade. Trivial anterior collection as well.   4. The mitral valve is degenerative. Mild to moderate mitral valve  regurgitation.   5. The aortic valve is tricuspid. Aortic valve regurgitation is not  visualized. No aortic stenosis is present.   6. The inferior vena cava is dilated in size with <50% respiratory  variability, suggesting right atrial pressure of 15 mmHg.   Comparison(s): Prior images unable to be directly viewed.   Physical Exam VS:  BP 106/62   Pulse 64   Ht 5' 11 (1.803 m)  Wt 238 lb 9.6 oz (108.2 kg)   SpO2 98%   BMI 33.28 kg/m        Wt Readings from Last 3 Encounters:  08/26/24 238 lb 9.6 oz (108.2 kg)  07/01/24 240 lb (108.9 kg)  06/03/24 240 lb (108.9 kg)    GEN: Obese, 63 y.o. male in no acute distress NECK: No JVD; No carotid  bruits CARDIAC: S1/S2, RRR, no murmurs, rubs, gallops RESPIRATORY:  Clear to auscultation without rales, wheezing or rhonchi  ABDOMEN: Soft, non-tender, non-distended EXTREMITIES:  No edema; No deformity   ASSESSMENT AND PLAN  HFrEF, pulmonary HTN Stage C, NYHA class I-II symptoms. EF 30 to 35%, mild LVH, indeterminate diastolic parameters, moderately elevated PASP with estimated right ventricular systolic pressure of 47.7 mmHg. WHO group 2/3 most likely.  Euvolemic and well compensated on exam.  GDMT limited due to BP trends.  Continue carvedilol , Farxiga , Entresto , torsemide  and potassium supplement. Low sodium diet, fluid restriction <2L, and daily weights encouraged. Educated to contact our office for weight gain of 2 lbs overnight or 5 lbs in one week.  Follow-up with heart failure clinic as scheduled.  2. Pericardial effusion Small to moderate pericardial effusion noted on echocardiogram from July 2025 this effusion was noted to be posterior to left ventricle, no evidence of cardiac tamponade.  Recommend updating Echo at next office visit with HF clinic.   3. CAD Stable with no anginal symptoms. No indication for ischemic evaluation.  Continue aspirin , atorvastatin , carvedilol , Entresto , and nitroglycerin  as needed. Heart healthy diet and regular cardiovascular exercise encouraged.   4. HTN BP stable. Discussed to monitor BP at home at least 2 hours after medications and sitting for 5-10 minutes.  Continue current medication regimen. Heart healthy diet and regular cardiovascular exercise encouraged.   5. HLD Most recent LDL 58.  Continue atorvastatin . Heart healthy diet and regular cardiovascular exercise encouraged.   6. PVD, s/p right BKA, carotid artery stenosis Denies any issues.  Continue current medication regimen.  Most recent carotid duplex from July 2025 showed mild 1 to 39% ICA stenosis.  Will continue to monitor.   7. Hx of CVA Has chronic right weakness along upper  extremity from past stroke. Continue current medication regimen. Continue to follow with PCP.  8. Obesity   Weight loss via diet and exercise encouraged. Discussed the impact being overweight would have on cardiovascular risk.   Dispo: Follow-up with MD/APP in 3 to 4 months or sooner if any changes.  Signed, Almarie Crate, NP

## 2024-08-26 NOTE — Patient Instructions (Signed)
 Medication Instructions:  Your physician has recommended you make the following change in your medication:   -Change Torsemide  to 20 mg once daily. You may take an additional tablet as needed for swelling, weight gain, or shortness of breath   *If you need a refill on your cardiac medications before your next appointment, please call your pharmacy*  Lab Work: None If you have labs (blood work) drawn today and your tests are completely normal, you will receive your results only by: MyChart Message (if you have MyChart) OR A paper copy in the mail If you have any lab test that is abnormal or we need to change your treatment, we will call you to review the results.  Testing/Procedures: None  Follow-Up: At [facility], you and your health needs are our priority.  As part of our continuing mission to provide you with exceptional heart care, our providers are all part of one team.  This team includes your primary Cardiologist (physician) and Advanced Practice Providers or APPs (Physician Assistants and Nurse Practitioners) who all work together to provide you with the care you need, when you need it.  Your next appointment:   6 month(s)  Provider:   You may see Alvan Carrier, MD or the following Advanced Practice Provider on your designated Care Team:   Almarie Crate, NP    We recommend signing up for the patient portal called MyChart.  Sign up information is provided on this After Visit Summary.  MyChart is used to connect with patients for Virtual Visits (Telemedicine).  Patients are able to view lab/test results, encounter notes, upcoming appointments, etc.  Non-urgent messages can be sent to your provider as well.   To learn more about what you can do with MyChart, go to [email or web address].   Other Instructions

## 2024-08-28 DIAGNOSIS — E782 Mixed hyperlipidemia: Secondary | ICD-10-CM | POA: Diagnosis not present

## 2024-08-28 DIAGNOSIS — E1369 Other specified diabetes mellitus with other specified complication: Secondary | ICD-10-CM | POA: Diagnosis not present

## 2024-08-28 DIAGNOSIS — Z125 Encounter for screening for malignant neoplasm of prostate: Secondary | ICD-10-CM | POA: Diagnosis not present

## 2024-09-03 ENCOUNTER — Encounter: Payer: Self-pay | Admitting: Internal Medicine

## 2024-09-03 DIAGNOSIS — D649 Anemia, unspecified: Secondary | ICD-10-CM | POA: Diagnosis not present

## 2024-09-03 DIAGNOSIS — I10 Essential (primary) hypertension: Secondary | ICD-10-CM | POA: Diagnosis not present

## 2024-09-03 DIAGNOSIS — N182 Chronic kidney disease, stage 2 (mild): Secondary | ICD-10-CM | POA: Diagnosis not present

## 2024-09-03 DIAGNOSIS — I129 Hypertensive chronic kidney disease with stage 1 through stage 4 chronic kidney disease, or unspecified chronic kidney disease: Secondary | ICD-10-CM | POA: Diagnosis not present

## 2024-09-03 DIAGNOSIS — J90 Pleural effusion, not elsewhere classified: Secondary | ICD-10-CM | POA: Diagnosis not present

## 2024-09-03 DIAGNOSIS — E1369 Other specified diabetes mellitus with other specified complication: Secondary | ICD-10-CM | POA: Diagnosis not present

## 2024-09-03 DIAGNOSIS — Z23 Encounter for immunization: Secondary | ICD-10-CM | POA: Diagnosis not present

## 2024-09-03 DIAGNOSIS — E782 Mixed hyperlipidemia: Secondary | ICD-10-CM | POA: Diagnosis not present

## 2024-09-03 DIAGNOSIS — I5042 Chronic combined systolic (congestive) and diastolic (congestive) heart failure: Secondary | ICD-10-CM | POA: Diagnosis not present

## 2024-09-09 ENCOUNTER — Telehealth: Admitting: *Deleted

## 2024-09-09 ENCOUNTER — Other Ambulatory Visit: Payer: Self-pay

## 2024-09-09 NOTE — Patient Instructions (Signed)
 Visit Information  Thank you for taking time to visit with me today. Please don't hesitate to contact me if I can be of assistance to you before our next scheduled appointment.  Your next care management appointment is by telephone on 10/07/2024 at 10:00 AM   Telephone follow-up in 1 month  Please call the care guide team at [phone] if you need to cancel, schedule, or reschedule an appointment.   Please call the Suicide and Crisis Lifeline: 988 call the [place]  National Suicide Prevention Lifeline: 979-290-7698 or TTY: 959-438-0540 TTY 504-127-4947) to talk to a trained counselor call 1-800-273-TALK (toll free, 24 hour hotline) call the [facility]: [phone] call 911 if you are experiencing a Mental Health or Behavioral Health Crisis or need someone to talk to.  Hendricks Her RN, BSN  [place] VBCI-Population Health RN Case Information Systems Manager [phone]

## 2024-09-11 ENCOUNTER — Ambulatory Visit: Attending: Nurse Practitioner

## 2024-09-11 DIAGNOSIS — I5020 Unspecified systolic (congestive) heart failure: Secondary | ICD-10-CM | POA: Diagnosis not present

## 2024-09-11 DIAGNOSIS — I3139 Other pericardial effusion (noninflammatory): Secondary | ICD-10-CM | POA: Diagnosis not present

## 2024-09-11 LAB — ECHOCARDIOGRAM LIMITED
AR max vel: 4.69 cm2
AV Peak grad: 4.2 mmHg
Ao pk vel: 1.02 m/s
Area-P 1/2: 4.07 cm2
Calc EF: 41.6 %
S' Lateral: 3.5 cm
Single Plane A2C EF: 28.5 %
Single Plane A4C EF: 53.5 %

## 2024-09-11 MED ORDER — PERFLUTREN LIPID MICROSPHERE
1.0000 mL | INTRAVENOUS | Status: AC | PRN
  Administered 2024-09-11: 4 mL via INTRAVENOUS

## 2024-09-16 ENCOUNTER — Ambulatory Visit: Payer: Self-pay | Admitting: Nurse Practitioner

## 2024-10-07 ENCOUNTER — Other Ambulatory Visit: Payer: Self-pay

## 2024-10-07 NOTE — Patient Outreach (Signed)
 Complex Care Management   Visit Note  10/07/2024  Name:  Albert [name] MRN: [id number] DOB: 02-11-1961  Situation: Referral received for Complex Care Management related to Heart Failure and Diabetes with Complications I obtained verbal consent from Patient.  Visit completed with Patient  on the phone  Background:   Past Medical History:  Diagnosis Date   Allergic rhinitis    Anemia    Chronic cough    Chronic sinusitis    Complication of anesthesia    couldn't swallow and talk   Coronary artery disease    CVA (cerebral infarction) 2011   R hand deficit   Diabetes mellitus without complication (HCC) ~2000   last HbA1c ~9 .... fasting 160s   GERD (gastroesophageal reflux disease)    Heart murmur    Hypercholesteremia    Hypertension    Myocardial infarction (HCC) 2014   Osteomyelitis (HCC)    first and third metatarsal   Pneumonia    Splenic infarct    setting of lupus anticoagulant    Assessment: Patient Reported Symptoms:  Cognitive Cognitive Status: No symptoms reported Cognitive/Intellectual Conditions Management [RPT]: None reported or documented in medical history or problem list   Health Maintenance Behaviors: Annual physical exam Healing Pattern: Average Health Facilitated by: Healthy diet, Prayer/meditation  Neurological Neurological Review of Symptoms: No symptoms reported Neurological Management Strategies: Routine screening Neurological Self-Management Outcome: 4 (good)  HEENT HEENT Symptoms Reported: No symptoms reported HEENT Management Strategies: Routine screening HEENT Self-Management Outcome: 4 (good)    Cardiovascular Cardiovascular Symptoms Reported: No symptoms reported Does patient have uncontrolled Hypertension?: No Cardiovascular Management Strategies: Routine screening, Medication therapy Weight: 232 lb (105.2 kg) Cardiovascular Self-Management Outcome: 4 (good) Cardiovascular Comment: low sodium  Respiratory Respiratory Symptoms  Reported: No symptoms reported Respiratory Management Strategies: Routine screening Respiratory Self-Management Outcome: 4 (good)  Endocrine Endocrine Symptoms Reported: No symptoms reported Is patient diabetic?: Yes Is patient checking blood sugars at home?: Yes List most recent blood sugar readings, include date and time of day: 114 12/22 fasting; 112 12/2 fasting Endocrine Self-Management Outcome: 4 (good) Endocrine Comment: discussed reason for increased A1C  States Its a war between me and food Disucssed options  Gastrointestinal Gastrointestinal Symptoms Reported: No symptoms reported Gastrointestinal Management Strategies: Coping strategies Gastrointestinal Self-Management Outcome: 4 (good)    Genitourinary Genitourinary Symptoms Reported: No symptoms reported Genitourinary Management Strategies: Adequate rest, Fluid modification Genitourinary Self-Management Outcome: 4 (good) Genitourinary Comment: I drink 3-4 bottles of water  everyday  Integumentary Integumentary Symptoms Reported: No symptoms reported Skin Management Strategies: Routine screening Skin Self-Management Outcome: 4 (good)  Musculoskeletal Musculoskelatal Symptoms Reviewed: Difficulty walking Additional Musculoskeletal Details: [place] if going [place] or store etc Musculoskeletal Management Strategies: Coping strategies, Routine screening Musculoskeletal Self-Management Outcome: 4 (good) Falls in the past year?: No Number of falls in past year: 1 or less Was there an injury with Fall?: No Fall Risk Category Calculator: 0 Patient Fall Risk Level: Low Fall Risk Patient at Risk for Falls Due to: Impaired mobility Fall risk Follow up: Falls evaluation completed  Psychosocial Psychosocial Symptoms Reported: No symptoms reported Behavioral Management Strategies: Coping strategies, Support system Behavioral Health Self-Management Outcome: 4 (good) Major Change/Loss/Stressor/Fears (CP): Denies Techniques to  Cope with Loss/Stress/Change: Diversional activities Quality of Family Relationships: helpful, involved, supportive Do you feel physically threatened by others?: No    10/07/2024    PHQ2-9 Depression Screening   Little interest or pleasure in doing things Not at all  Feeling down, depressed, or hopeless Not at  all  PHQ-2 - Total Score 0  Trouble falling or staying asleep, or sleeping too much    Feeling tired or having little energy    Poor appetite or overeating     Feeling bad about yourself - or that you are a failure or have let yourself or your family down    Trouble concentrating on things, such as reading the newspaper or watching television    Moving or speaking so slowly that other people could have noticed.  Or the opposite - being so fidgety or restless that you have been moving around a lot more than usual    Thoughts that you would be better off dead, or hurting yourself in some way    PHQ2-9 Total Score    If you checked off any problems, how difficult have these problems made it for you to do your work, take care of things at home, or get along with other people    Depression Interventions/Treatment      Today's Vitals   10/07/24 1009  BP: 116/72  Weight: 232 lb (105.2 kg)  Height: 5' 11 (1.803 m)   Pain Scale: 0-10 Pain Score: 0-No pain  Medications Reviewed Today     Reviewed by Kay Hendricks MATSU, RN (Case Manager) on 10/07/24 at 1007  Med List Status: <None>   Medication Order Taking? Sig Documenting Provider Last Dose Status Informant  ACCU-CHEK GUIDE TEST test strip [id number] Yes SMARTSIG:Strip(s) [provider]  Active Self, Pharmacy Records  aspirin  EC 81 MG tablet [id number] Yes Take 1 tablet (81 mg total) by mouth daily. Antoinette Doe, MD  Active Self, Pharmacy Records  atorvastatin  (LIPITOR ) 40 MG tablet [id number] Yes Take 40 mg by mouth daily. [provider]  Active Self, Pharmacy Records  carvedilol  (COREG ) 6.25 MG tablet [id number]  Yes Take 6.25 mg by mouth 2 (two) times daily with a meal. [provider]  Active   clotrimazole -betamethasone  (LOTRISONE ) cream [id number] Yes Apply 1 application topically 2 (two) times daily. Watt Rush, MD  Active Self, Pharmacy Records  dapagliflozin  propanediol (FARXIGA ) 10 MG TABS tablet [id number] Yes Take 10 mg by mouth daily. [provider]  Active   gabapentin (NEURONTIN) 300 MG capsule [id number] Yes Take 300 mg by mouth 3 (three) times daily as needed (pain). Take 1 capsule 3 times a day by oral route as needed. [provider]  Active Self, Pharmacy Records  hydrALAZINE  (APRESOLINE ) 10 MG tablet [id number]  Take 10 mg by mouth 2 (two) times daily.  Patient not taking: Reported on 10/07/2024   [provider]  Active   insulin  lispro (HUMALOG) 100 UNIT/ML KwikPen [id number] Yes Inject 20 Units into the skin 3 (three) times daily before meals. [provider]  Active Self, Pharmacy Records  metFORMIN  (GLUCOPHAGE -XR) 500 MG 24 hr tablet [id number] Yes Take 1 tablet (500 mg total) by mouth daily with breakfast. Ricky Fines, MD  Active   Multiple Vitamins-Minerals (MENS 50+ MULTIVITAMIN) TABS [id number] Yes Take 1 tablet by mouth daily. [provider]  Active Self, Pharmacy Records  nitroGLYCERIN  (NITRODUR - DOSED IN MG/24 HR) 0.2 mg/hr patch [id number] Yes APPLY 1 PATCH ONTO SKIN  DAILY Barbarann Oneil BROCKS, MD  Active Self, Pharmacy Records           Med Note SOILA LYLE BROCKS   Tue Aug 02, 2022 10:51 AM) Used on left foot  potassium chloride  (K-DUR,KLOR-CON ) 10 MEQ tablet [id number]  Take 1 tablet (10 mEq total) by mouth daily.  Patient not taking: Reported on 10/07/2024   Alvan Dorn FALCON, MD  Active Self, Pharmacy Records  potassium chloride  (KLOR-CON ) 10 MEQ tablet [id number] Yes Take 10 mEq by mouth 2 (two) times daily.  Patient taking differently: Take 10 mEq by mouth daily.   [provider]  Active   sacubitril -valsartan   (ENTRESTO ) 24-26 MG [id number] Yes Take 1 tablet by mouth 2 (two) times daily. Alvan Dorn FALCON, MD  Active   Semaglutide,0.25 or 0.5MG /DOS, (OZEMPIC, 0.25 OR 0.5 MG/DOSE,) 2 MG/3ML SOPN [id number]  TAKE 0.25 MG WEEKLY X4 WEEKS AND THEN INCREASE TO 0.5 MG WEEKLY  Patient not taking: Reported on 10/07/2024   [provider]  Active   torsemide  (DEMADEX ) 20 MG tablet [id number] Yes Take 1 tablet (20 mg total) by mouth daily. May take an extra tablet daily as needed for swelling, sob, weight gain  Patient taking differently: Take 20 mg by mouth 2 (two) times daily. May take an extra tablet daily as needed for swelling, sob, weight gain   Miriam Rosalind Guido, NP  Active   TRESIBA FLEXTOUCH 100 UNIT/ML SOPN FlexTouch Pen [id number] Yes Inject 57 Units into the skin at bedtime. [provider]  Active Self, Pharmacy Records            Recommendation:   Continue Current Plan of Care Try low carb options   Follow Up Plan:   Telephone follow-up in 1 month  Hendricks Her RN, BSN  [place] VBCI-Population Health RN Case Information Systems Manager [phone]

## 2024-10-07 NOTE — Patient Instructions (Addendum)
 Visit Information  Thank you for taking time to visit with me today. Please don't hesitate to contact me if I can be of assistance to you before our next scheduled appointment.  Your next care management appointment is by telephone on 11/07/2024 at 10:00 AM   Telephone follow-up in 1 month  Please call the care guide team at [phone] if you need to cancel, schedule, or reschedule an appointment.   Please call the Suicide and Crisis Lifeline: 988 call the [place]  National Suicide Prevention Lifeline: 401-482-9682 or TTY: 331-401-7258 TTY 213-032-4532) to talk to a trained counselor call 1-800-273-TALK (toll free, 24 hour hotline) call the [facility]: (747) [phone] call 911 if you are experiencing a Mental Health or Behavioral Health Crisis or need someone to talk to.  Hendricks Her RN, BSN   I VBCI-Population Health RN Case Information Systems Manager [phone]

## 2024-10-14 ENCOUNTER — Other Ambulatory Visit: Payer: Self-pay | Admitting: Cardiology

## 2024-11-07 ENCOUNTER — Other Ambulatory Visit: Payer: Self-pay

## 2024-11-07 NOTE — Patient Instructions (Signed)
 Visit Information  Thank you for taking time to visit with me today. Please don't hesitate to contact me if I can be of assistance to you before our next scheduled appointment.  Your next care management appointment is by telephone on 12/09/2024 at 10:00 am   Telephone follow-up in 1 month  Please call the care guide team at [phone] if you need to cancel, schedule, or reschedule an appointment.   Please call the Suicide and Crisis Lifeline: 988 call the [place]  National Suicide Prevention Lifeline: 3438518604 or TTY: 929-301-7582 TTY (951) 438-7149) to talk to a trained counselor call 1-800-273-TALK (toll free, 24 hour hotline) call the [facility]: 940-861-4787 call 911 if you are experiencing a Mental Health or Behavioral Health Crisis or need someone to talk to.  Hendricks Her RN, BSN  [place] VBCI-Population Health RN Case Information Systems Manager [phone]

## 2024-11-07 NOTE — Patient Outreach (Signed)
 Complex Care Management   Visit Note  11/07/2024  Name:  Albert [name] MRN: [id number] DOB: June 14, 1961  Situation: Referral received for Complex Care Management related to Heart Failure and Diabetes with Complications I obtained verbal consent from Patient.  Visit completed with Patient  on the phone  Background:   Past Medical History:  Diagnosis Date   Allergic rhinitis    Anemia    Chronic cough    Chronic sinusitis    Complication of anesthesia    couldn't swallow and talk   Coronary artery disease    CVA (cerebral infarction) 2011   R hand deficit   Diabetes mellitus without complication (HCC) ~2000   last HbA1c ~9 .... fasting 160s   GERD (gastroesophageal reflux disease)    Heart murmur    Hypercholesteremia    Hypertension    Myocardial infarction (HCC) 2014   Osteomyelitis (HCC)    first and third metatarsal   Pneumonia    Splenic infarct    setting of lupus anticoagulant    Assessment: Patient Reported Symptoms:  Cognitive Cognitive Status: No symptoms reported Cognitive/Intellectual Conditions Management [RPT]: None reported or documented in medical history or problem list   Health Maintenance Behaviors: Annual physical exam Healing Pattern: Average Health Facilitated by: Healthy diet, Prayer/meditation  Neurological Neurological Review of Symptoms: No symptoms reported Neurological Management Strategies: Routine screening Neurological Self-Management Outcome: 4 (good)  HEENT HEENT Symptoms Reported: No symptoms reported HEENT Management Strategies: Routine screening HEENT Self-Management Outcome: 4 (good)    Cardiovascular Cardiovascular Symptoms Reported: No symptoms reported Does patient have uncontrolled Hypertension?: No Cardiovascular Management Strategies: Routine screening Weight: 232 lb (105.2 kg) Cardiovascular Self-Management Outcome: 4 (good) Cardiovascular Comment: low sodium  Respiratory Respiratory Symptoms Reported: No symptoms  reported Respiratory Management Strategies: Routine screening Respiratory Self-Management Outcome: 4 (good)  Endocrine Endocrine Symptoms Reported: No symptoms reported Is patient diabetic?: Yes Is patient checking blood sugars at home?: Yes List most recent blood sugar readings, include date and time of day: 103 Fasting 11/07/2024; 88 Fasting 11/06/2024; 105 Fasting 11/05/2024 Endocrine Self-Management Outcome: 4 (good)  Gastrointestinal Gastrointestinal Symptoms Reported: No symptoms reported Gastrointestinal Management Strategies: Coping strategies Gastrointestinal Self-Management Outcome: 4 (good)    Genitourinary Genitourinary Symptoms Reported: No symptoms reported Genitourinary Management Strategies: Adequate rest, Fluid modification Genitourinary Self-Management Outcome: 4 (good) Genitourinary Comment: at least 3 bottles of water  daily  Integumentary Integumentary Symptoms Reported: No symptoms reported Skin Management Strategies: Routine screening Skin Self-Management Outcome: 4 (good)  Musculoskeletal Musculoskelatal Symptoms Reviewed: Difficulty walking Additional Musculoskeletal Details: [place] if going outside Musculoskeletal Management Strategies: Coping strategies, Routine screening Musculoskeletal Self-Management Outcome: 4 (good) Falls in the past year?: No Number of falls in past year: 1 or less Was there an injury with Fall?: No Fall Risk Category Calculator: 0 Patient Fall Risk Level: Low Fall Risk Patient at Risk for Falls Due to: Impaired mobility Fall risk Follow up: Falls evaluation completed  Psychosocial Psychosocial Symptoms Reported: No symptoms reported Behavioral Management Strategies: Coping strategies Behavioral Health Self-Management Outcome: 4 (good)   Quality of Family Relationships: helpful, involved, supportive Do you feel physically threatened by others?: No    11/07/2024    PHQ2-9 Depression Screening   Little interest or pleasure in doing  things Not at all  Feeling down, depressed, or hopeless Not at all  PHQ-2 - Total Score 0  Trouble falling or staying asleep, or sleeping too much    Feeling tired or having little energy    Poor appetite or  overeating     Feeling bad about yourself - or that you are a failure or have let yourself or your family down    Trouble concentrating on things, such as reading the newspaper or watching television    Moving or speaking so slowly that other people could have noticed.  Or the opposite - being so fidgety or restless that you have been moving around a lot more than usual    Thoughts that you would be better off dead, or hurting yourself in some way    PHQ2-9 Total Score    If you checked off any problems, how difficult have these problems made it for you to do your work, take care of things at home, or get along with other people    Depression Interventions/Treatment      Today's Vitals   11/07/24 1007  BP: 120/75  Weight: 232 lb (105.2 kg)  Height: 5' 11 (1.803 m)   Pain Scale: 0-10 Pain Score: 4  Pain Type: Chronic pain Pain Location: Foot Pain Orientation: Left Pain Descriptors / Indicators: Dull, Aching Pain Onset: On-going Patients Stated Pain Goal: 0 Pain Intervention(s): Medication (See eMAR)  Medications Reviewed Today     Reviewed by Kay Hendricks MATSU, RN (Case Manager) on 11/07/24 at 1025  Med List Status: <None>   Medication Order Taking? Sig Documenting Provider Last Dose Status Informant  ACCU-CHEK GUIDE TEST test strip [id number] Yes SMARTSIG:Strip(s) [provider]  Active Self, Pharmacy Records  aspirin  EC 81 MG tablet [id number] Yes Take 1 tablet (81 mg total) by mouth daily. Antoinette Doe, MD  Active Self, Pharmacy Records  atorvastatin  (LIPITOR ) 40 MG tablet [id number] Yes Take 40 mg by mouth daily. [provider]  Active Self, Pharmacy Records  carvedilol  (COREG ) 6.25 MG tablet [id number] Yes Take 6.25 mg by mouth 2 (two) times daily with  a meal. [provider]  Active   clotrimazole -betamethasone  (LOTRISONE ) cream [id number] Yes Apply 1 application topically 2 (two) times daily. Watt Rush, MD  Active Self, Pharmacy Records  dapagliflozin  propanediol (FARXIGA ) 10 MG TABS tablet [id number] Yes Take 10 mg by mouth daily. [provider]  Active   gabapentin (NEURONTIN) 300 MG capsule [id number] Yes Take 300 mg by mouth 3 (three) times daily as needed (pain). Take 1 capsule 3 times a day by oral route as needed. [provider]  Active Self, Pharmacy Records  hydrALAZINE  (APRESOLINE ) 10 MG tablet [id number] Yes Take 10 mg by mouth 2 (two) times daily. [provider]  Active   insulin  lispro (HUMALOG) 100 UNIT/ML KwikPen [id number] Yes Inject 20 Units into the skin 3 (three) times daily before meals. [provider]  Active Self, Pharmacy Records  metFORMIN  (GLUCOPHAGE -XR) 500 MG 24 hr tablet [id number] Yes Take 1 tablet (500 mg total) by mouth daily with breakfast. Ricky Fines, MD  Active   Multiple Vitamins-Minerals (MENS 50+ MULTIVITAMIN) TABS [id number] Yes Take 1 tablet by mouth daily. [provider]  Active Self, Pharmacy Records  nitroGLYCERIN  (NITRODUR - DOSED IN MG/24 HR) 0.2 mg/hr patch [id number] Yes APPLY 1 PATCH ONTO SKIN  DAILY Barbarann Oneil BROCKS, MD  Active Self, Pharmacy Records           Med Note SOILA LYLE BROCKS   Tue Aug 02, 2022 10:51 AM) Used on left foot  potassium chloride  (K-DUR,KLOR-CON ) 10 MEQ tablet [id number]  Take 1 tablet (10 mEq total) by mouth daily.  Patient not taking: Reported on 11/07/2024   Alvan Dorn FALCON,  MD  Active Self, Pharmacy Records  potassium chloride  (KLOR-CON ) 10 MEQ tablet [id number] Yes Take 10 mEq by mouth 2 (two) times daily. [provider]  Active   sacubitril -valsartan  (ENTRESTO ) 24-26 MG [id number] Yes TAKE 1 TABLET BY MOUTH TWICE A DAY Branch, Dorn FALCON, MD  Active   Semaglutide,0.25 or 0.5MG /DOS, (OZEMPIC, 0.25 OR 0.5  MG/DOSE,) 2 MG/3ML SOPN [id number]  TAKE 0.25 MG WEEKLY X4 WEEKS AND THEN INCREASE TO 0.5 MG WEEKLY  Patient not taking: Reported on 11/07/2024   [provider]  Active   torsemide  (DEMADEX ) 20 MG tablet [id number] Yes Take 1 tablet (20 mg total) by mouth daily. May take an extra tablet daily as needed for swelling, sob, weight gain  Patient taking differently: Take 20 mg by mouth 2 (two) times daily. May take an extra tablet daily as needed for swelling, sob, weight gain   Miriam Jaivian Battaglini, NP  Active   TRESIBA FLEXTOUCH 100 UNIT/ML SOPN FlexTouch Pen [id number] Yes Inject 57 Units into the skin at bedtime. [provider]  Active Self, Pharmacy Records            Recommendation:   Continue Current Plan of Care  Follow Up Plan:   Telephone follow-up in 1 month  Hendricks Her RN, BSN  [place] [facility] RN Case Information Systems Manager [phone]

## 2024-11-15 ENCOUNTER — Other Ambulatory Visit (HOSPITAL_BASED_OUTPATIENT_CLINIC_OR_DEPARTMENT_OTHER): Payer: Self-pay

## 2024-11-15 ENCOUNTER — Telehealth: Payer: Self-pay | Admitting: Cardiology

## 2024-11-15 MED ORDER — SACUBITRIL-VALSARTAN 24-26 MG PO TABS
1.0000 | ORAL_TABLET | Freq: Two times a day (BID) | ORAL | 2 refills | Status: DC
  Filled 2024-11-15 – 2024-11-18 (×3): qty 60, 30d supply, fill #0

## 2024-11-15 NOTE — Telephone Encounter (Signed)
 Pt c/o medication issue:  1. Name of Medication: dapagliflozin  propanediol (FARXIGA ) 10 MG TABS tablet , sacubitril -valsartan  (ENTRESTO ) 24-26 MG   2. How are you currently taking this medication (dosage and times per day)? As written   3. Are you having a reaction (difficulty breathing--STAT)? no  4. What is your medication issue?    Medication has went up and insurance will not cover. Pt asking for other options.

## 2024-11-15 NOTE — Telephone Encounter (Signed)
 Spoke with patient regarding medication Entresto . I advised him I sent to our pharmacy to see if it would be a difference. Per pharmacy Copay is $75.30, It is being applied towards the deductible He stated that he still cannot afford that wither. I advised him I will reach out to our medication assistance team to see if there is anything else we can help him. Patient will also reach out to his PCP as he manages the Farxiga .

## 2024-11-18 ENCOUNTER — Telehealth: Payer: Self-pay | Admitting: Pharmacy Technician

## 2024-11-18 ENCOUNTER — Other Ambulatory Visit (HOSPITAL_BASED_OUTPATIENT_CLINIC_OR_DEPARTMENT_OTHER): Payer: Self-pay

## 2024-11-18 ENCOUNTER — Other Ambulatory Visit (HOSPITAL_COMMUNITY): Payer: Self-pay

## 2024-11-18 NOTE — Telephone Encounter (Signed)
 Patient Advocate Encounter   The patient was approved for a [employer] grant that will help cover the cost of entresto /farxiga  Total amount awarded, 7500.  Effective: 10/19/24 - 10/18/25   APW:[id number] ERW:[id number] Hmnle:[id number] PI:[id number] [employer] ID: [id number]   Pharmacy provided with approval and processing information. Patient informed via mychart

## 2024-11-22 ENCOUNTER — Other Ambulatory Visit (HOSPITAL_BASED_OUTPATIENT_CLINIC_OR_DEPARTMENT_OTHER): Payer: Self-pay

## 2024-11-22 ENCOUNTER — Other Ambulatory Visit: Payer: Self-pay | Admitting: Cardiology

## 2024-11-22 MED ORDER — SACUBITRIL-VALSARTAN 24-26 MG PO TABS: 1.0000 | ORAL_TABLET | Freq: Two times a day (BID) | ORAL | 3 refills | Status: AC

## 2024-11-22 MED ORDER — DAPAGLIFLOZIN PROPANEDIOL 10 MG PO TABS: 10.0000 mg | ORAL_TABLET | Freq: Every day | ORAL | 3 refills | Status: AC

## 2024-11-22 NOTE — Telephone Encounter (Signed)
" °*  STAT* If patient is at the pharmacy, call can be transferred to refill team.   1. Which medications need to be refilled? (please list name of each medication and dose if known)  sacubitril -valsartan  (ENTRESTO ) 24-26 MG  dapagliflozin  propanediol (FARXIGA ) 10 MG TABS tablet  2. Which pharmacy/location (including street and city if local pharmacy) is medication to be sent to? [id number] - [place], [place] - [address]   3. Do they need a 30 day or 90 day supply? 30 day   Pt is out of medication  "

## 2024-12-09 ENCOUNTER — Telehealth

## 2025-02-24 ENCOUNTER — Ambulatory Visit: Admitting: Nurse Practitioner
